# Patient Record
Sex: Female | Born: 1995 | Race: Asian | Hispanic: No | Marital: Single | Smoking: Never smoker
Health system: Southern US, Community
[De-identification: ages and names within clinical notes are randomized; demographics above are authoritative.]

## PROBLEM LIST (undated history)

## (undated) DIAGNOSIS — N319 Neuromuscular dysfunction of bladder, unspecified: Secondary | ICD-10-CM

## (undated) DIAGNOSIS — Q059 Spina bifida, unspecified: Secondary | ICD-10-CM

## (undated) DIAGNOSIS — K592 Neurogenic bowel, not elsewhere classified: Secondary | ICD-10-CM

## (undated) DIAGNOSIS — Z982 Presence of cerebrospinal fluid drainage device: Secondary | ICD-10-CM

## (undated) HISTORY — PX: VENTRICULOPERITONEAL SHUNT: SHX204

---
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
## 2016-01-01 ENCOUNTER — Encounter: Payer: Self-pay | Admitting: Physical Therapy

## 2016-01-01 ENCOUNTER — Ambulatory Visit: Payer: Medicaid Other | Admitting: Physical Therapy

## 2016-01-01 DIAGNOSIS — M6281 Muscle weakness (generalized): Secondary | ICD-10-CM

## 2016-01-01 NOTE — Therapy (Signed)
[place] MAIN [facility] SERVICES [address] [place], [place], [address] Phone: [phone]   Fax:  [phone]  Physical Therapy Treatment  Patient Details  Name: Michelle Randall MRN: [id number] Date of Birth: [birth year] No Data Recorded  Encounter Date: 01/01/2016      PT End of Session - 01/01/16 0854    Visit Number 27   Number of Visits 25   Date for PT Re-Evaluation 04/03/16   Authorization Type medicaid   PT Start Time 0830   PT Stop Time 0915   PT Time Calculation (min) 45 min   Equipment Utilized During Treatment Gait belt   Activity Tolerance Patient tolerated treatment well   Behavior During Therapy [facility] for tasks assessed/performed      History reviewed. No pertinent past medical history.  History reviewed. No pertinent past surgical history.  There were no vitals filed for this visit.  Visit Diagnosis:  Muscle weakness      Subjective Assessment - 01/01/16 0853    Subjective Patient is doing well today.    Patient is accompained by: Family member   Limitations Walking   Patient Stated Goals Patient wants to improve her core strength.    Currently in Pain? No/denies      Therapeutic exercise:  Nu-step x 5 minutes  Resisted side-steeping YTB 4 lengths x 2; Standing mini squats 2 x 10 with YTB around knees to encourage abduction; Sit to stand without UE support 2 x 10; Step-ups to 6" step x 10 bilateral; Quantum leg press 100 #10; Min cueing needed to appropriately perform balance and strengthening tasks with leg, and head position. Decreased coordination demonstrated requiring consistent verbal cueing to correct form. Patient responds well to verbal and tactile cues to correct form and technique. CGA to SBA for safety with activities. Uses to increase intensity and amplitude of movements throughout                            PT Education - 01/01/16 0854    Education provided Yes    Education Details HEP   Person(s) Educated Patient   Methods Explanation   Comprehension Verbalized understanding             PT Long Term Goals - 11/05/15 1809    PT LONG TERM GOAL #1   Status On-going   PT LONG TERM GOAL #2   Status On-going   PT LONG TERM GOAL #3   Status Partially Met   PT LONG TERM GOAL #4   Status Achieved   PT LONG TERM GOAL #5   Status Partially Met   Additional Long Term Goals   Additional Long Term Goals Yes   PT LONG TERM GOAL #6   Status Partially Met   PT LONG TERM GOAL #7   Status Achieved   PT LONG TERM GOAL #8   Title Patient will be able to ambulate on inclines and grass independenlty with LRAD   Time 6   Period Months   Status New   PT LONG TERM GOAL  #9   TITLE Patient will be abe to transfer from low chair or stool wihtout UE support independently   Time 6   Period Months   Status New   PT LONG TERM GOAL  #10   TITLE Patient will be able to transfer from the floor to standing independently with use of LRAD   Time 6  Period Months   Status New               Plan - 01/01/16 9892    Clinical Impression Statement Pt demonstrated decreased endurance with sit-to-stands exhibiting fatigue towards the end of second set with increased breathing rate   Pt will benefit from skilled therapeutic intervention in order to improve on the following deficits Abnormal gait;Decreased balance;Decreased endurance;Difficulty walking;Decreased strength   Rehab Potential Good   Clinical Impairments Affecting Rehab Potential weakness and decreased standing balance   PT Frequency 1x / week   PT Duration 12 weeks   PT Treatment/Interventions Therapeutic exercise;Therapeutic activities;Gait training;Balance training   PT Next Visit Plan Continue with core and LE strengthening and TM ambulation   PT Home Exercise Plan Continued from previous sessions.    Consulted and Agree with Plan of Care Family member/caregiver        Problem  List There are no active problems to display for this patient. Alanson Puls, PT, DPT  [place], Minette Headland S 01/01/2016, 9:07 AM  [place] MAIN [facility] SERVICES [place], [place], [address] Phone: [phone]   Fax:  [phone]  Name: Michelle Randall MRN: [id number] Date of Birth: [birth year]

## 2016-01-02 NOTE — Therapy (Signed)
[place] MAIN [facility] SERVICES [address] [place], [place], [address] Phone: [phone]   Fax:  [phone]  Occupational Therapy Treatment  Patient Details  Name: Michelle Randall MRN: [id number] Date of Birth: [birth year] No Data Recorded  Encounter Date: 12/30/2015      OT End of Session - 01/02/16 0957    Visit Number 5   Date for OT Re-Evaluation 01/20/16   Authorization Type medicaid   OT Start Time 0830   OT Stop Time 0915   OT Time Calculation (min) 45 min   Activity Tolerance Patient tolerated treatment well   Behavior During Therapy [facility] for tasks assessed/performed      No past medical history on file.  No past surgical history on file.  There were no vitals filed for this visit.  Visit Diagnosis:  Muscle weakness  Lack of coordination      Subjective Assessment - 01/02/16 0955    Subjective  Patient reports she will be going to the [facility] with her school team to compete soon.  She is excited and is hopeful they will go to [employer] to compete.    Patient is accompained by: Family member   Patient Stated Goals wants to be independent with all tasks so she will be able to go away to college.   Currently in Pain? No/denies   Multiple Pain Sites No                      OT Treatments/Exercises (OP) - 01/02/16 1003    ADLs   Writing Handwriting skills for taking notes and formulating ocean bowl questions for review for her upcoming tournament for school.  She was able to write/print 8 questions with good legibility 5 of 8.  Required some rest breaks and hand stretches during writing tasks.    Fine Motor Coordination   Other Fine Motor Exercises Patient seen for manipulation of small 1/2 inch sized objects with focus on translatory skills of the hand as well as using the hand for storage while performing prehension patterns. Knotting and unknotting exercises with both hands with knots tighter this date  to work towards coordination and strength of fingers.    Neurological Re-education Exercises   Other Exercises 1 Patient seen for red theraband exercises in sitting for shoulder flexion, ABD, diagonal patterns, elbow flexion extension for 10 reps for 2 sets each.  Resistive pinch pins placing each level of resistance to elevated surface.                  OT Education - 01/02/16 [phone]    Education provided Yes   Education Details handwriting tasks. HEP   Person(s) Educated Patient   Methods Explanation;Demonstration;Verbal cues   Comprehension Verbal cues required;Returned demonstration;Verbalized understanding             OT Long Term Goals - 11/05/15 1730    OT LONG TERM GOAL #1   Title Will cook the evening meal with som help for set up only   Time 12   Status Achieved   OT LONG TERM GOAL #2   Title Will be able to put on tight socks before braces after a shower   Time 12   Period Weeks   Status Achieved   OT LONG TERM GOAL #3   Title Will be able to go through a door that opens out and swings shut   Time 12   Period Weeks  Status Achieved   OT LONG TERM GOAL #4   Title General goal: Paitent will be able to be independent with activities needed when she goes to college next year    Time 12   Period Weeks   Status On-going   OT LONG TERM GOAL #5   Title Patient will improve grip strength to be able to achieve functional goals listed   Time 12   Period Months   Status Achieved   OT LONG TERM GOAL #6   Title Will now be able to go through a door that swings out using motorized wheel chair.   Time 6   Period Months   Status On-going   OT LONG TERM GOAL #7   Title (Higher level grip) Will be able to squeeze a lemon during cooking.   Baseline Only able to partially squeeze a lemon.   Time 6   Period Months   Status Partially Met   OT LONG TERM GOAL #8   Title Patient will be able to donn night leg brace indepenenetly before she goes to college.   Baseline  Dependent at this time mom or dad has to do this.   Time 6   Period Months   Status Achieved   OT LONG TERM GOAL  #9   Baseline Patient will be able to braid her own hair before college   Time 6   Period Months   Status On-going   OT LONG TERM GOAL  #10   TITLE Wll be able to wash her own hair before starting college   Baseline Mom has to wash her hair   Time 6   Period Months   Status Partially Met   OT LONG TERM GOAL  #11   TITLE Will be able to squeeze tooth paste out of the tube when it is less than half full.   Baseline Needs assist from her mom or dad to do this now.   Time 6   Period Months   OT LONG TERM GOAL  #12   TITLE Patient will be able to donn/doff crutch tips.   Baseline unable to donn/doff crutch tips.to change them   Time 6   Period Months   Status On-going   OT LONG TERM GOAL  #13   TITLE Patient will be able to complete 2 1/2 minutes straight with left shoulder flexion simulating driving with a stearing wheel ball to be able to drive to college 1 hr 40 min away   Baseline can do 30 second of shoulder flexion with 2 1/2 before fatigue and can only drive 15 min before right shoulder fatigues.   Time 6   Period Months   Status New   OT LONG TERM GOAL  #14   TITLE Will be able to open taped boxes (for moving to college) using a key or knife.   Baseline can only make a hole in the box and unable to open box.   Time 6   Period Months   Status New   OT LONG TERM GOAL  #15   TITLE Is not able to cut layers of paper which she will have to do for craft classes in college.   Baseline unable to cut through more than 2 layers.   Time 6   Period Months   Status New               Plan - 01/02/16 0957    Clinical Impression Statement Patient able to  demo handwriting with good legibility for printing of science bowl questions however, as she continues to write more than 4-5 sentences, her right hand fatigues and handwriting legibility decreases.  It will be  important for her to improve this skill to be able to take notes in class and especially when she is in college for long lectures.  She may have to compensate at that time with recordings if possible.  She would like to be able to take as many notes as possible since this helps her to learn material in a more comprehensive way.  She is improving with knotting and braiding activities and will need to continue to work on these motions overhead to be able to braid her own hair in a straight line down the back.     Pt will benefit from skilled therapeutic intervention in order to improve on the following deficits (Retired) Impaired UE functional use;Difficulty walking;Decreased strength;Decreased coordination;Decreased endurance   Rehab Potential Good   OT Frequency 1x / week   OT Duration 12 weeks   OT Treatment/Interventions Self-care/ADL training;Therapeutic exercise;Therapeutic exercises;Therapeutic activities   Consulted and Agree with Plan of Care Patient;Family member/caregiver        Problem List There are no active problems to display for this patient. Achilles Dunk, OTR/L, CLT   Lovett,Amy 01/02/2016, 10:17 AM  [place] MAIN [facility] SERVICES [place], [place], [address] Phone: [phone]   Fax:  [phone]  Name: ROLENA KNUTSON MRN: [id number] Date of Birth: 1996/10/19

## 2016-01-07 ENCOUNTER — Ambulatory Visit: Payer: Medicaid Other | Admitting: Physical Therapy

## 2016-01-07 ENCOUNTER — Encounter: Payer: Self-pay | Admitting: Occupational Therapy

## 2016-01-07 ENCOUNTER — Encounter: Payer: Medicaid Other | Admitting: Occupational Therapy

## 2016-01-07 ENCOUNTER — Ambulatory Visit: Payer: Medicaid Other | Admitting: Occupational Therapy

## 2016-01-07 DIAGNOSIS — M6281 Muscle weakness (generalized): Secondary | ICD-10-CM | POA: Diagnosis not present

## 2016-01-07 DIAGNOSIS — R279 Unspecified lack of coordination: Secondary | ICD-10-CM

## 2016-01-08 ENCOUNTER — Encounter: Payer: Self-pay | Admitting: Physical Therapy

## 2016-01-08 ENCOUNTER — Ambulatory Visit: Payer: Medicaid Other | Admitting: Physical Therapy

## 2016-01-08 DIAGNOSIS — R262 Difficulty in walking, not elsewhere classified: Secondary | ICD-10-CM

## 2016-01-08 DIAGNOSIS — M6281 Muscle weakness (generalized): Secondary | ICD-10-CM

## 2016-01-08 DIAGNOSIS — R279 Unspecified lack of coordination: Secondary | ICD-10-CM

## 2016-01-08 DIAGNOSIS — Z9181 History of falling: Secondary | ICD-10-CM

## 2016-01-08 NOTE — Therapy (Signed)
[place] MAIN [facility] SERVICES [address] [place], [place], [address] Phone: [phone]   Fax:  [phone]  Physical Therapy Treatment  Patient Details  Name: Michelle Randall MRN: [id number] Date of Birth: July 18, 1996 No Data Recorded  Encounter Date: 01/08/2016      PT End of Session - 01/08/16 0850    Visit Number 28   Number of Visits 25   Date for PT Re-Evaluation 04/03/16   Authorization Type medicaid   PT Start Time 0840   PT Stop Time 0925   PT Time Calculation (min) 45 min   Equipment Utilized During Treatment Gait belt   Activity Tolerance Patient tolerated treatment well   Behavior During Therapy [facility] for tasks assessed/performed      History reviewed. No pertinent past medical history.  History reviewed. No pertinent past surgical history.  There were no vitals filed for this visit.  Visit Diagnosis:  Muscle weakness  Lack of coordination  Difficulty walking  Personal history of fall      Subjective Assessment - 01/08/16 0849    Subjective Patient is doing well today.    Patient is accompained by: Family member   Limitations Walking   Patient Stated Goals Patient wants to improve her core strength.       Standing exercises with yTB BLE : Marching 2 x 10; SLR 2 x 10; Abduction 2 x 10; Extension 2 x 10; Squats x 10 with 5 sec hold Sorting balls on foam and sorting shapes on foam Resisted side-steeping YTB 4 lengths x 2; Standing mini squats 2 x 10  Sit to stand without UE support 2 x 10; Step-ups to 6" step x 10 bilateral; Quantum leg press 100 lbs x 20 Patient needs occasional verbal cueing to improve posture and cueing to correctly perform exercises slowly, holding at end of range to increase motor firing of desired muscle to encourage fatigue.                            PT Education - 01/08/16 0849    Education provided Yes   Education Details HEP   Person(s) Educated  Patient   Methods Explanation   Comprehension Verbalized understanding             PT Long Term Goals - 11/05/15 1809    PT LONG TERM GOAL #1   Status On-going   PT LONG TERM GOAL #2   Status On-going   PT LONG TERM GOAL #3   Status Partially Met   PT LONG TERM GOAL #4   Status Achieved   PT LONG TERM GOAL #5   Status Partially Met   Additional Long Term Goals   Additional Long Term Goals Yes   PT LONG TERM GOAL #6   Status Partially Met   PT LONG TERM GOAL #7   Status Achieved   PT LONG TERM GOAL #8   Title Patient will be able to ambulate on inclines and grass independenlty with LRAD   Time 6   Period Months   Status New   PT LONG TERM GOAL  #9   TITLE Patient will be abe to transfer from low chair or stool wihtout UE support independently   Time 6   Period Months   Status New   PT LONG TERM GOAL  #10   TITLE Patient will be able to transfer from the floor to standing independently with use  of LRAD   Time 6   Period Months   Status New               Plan - 01/08/16 0850    Clinical Impression Statement VC needed to full ext knees on leg press for increase muscle control.   Pt will benefit from skilled therapeutic intervention in order to improve on the following deficits Abnormal gait;Decreased balance;Decreased endurance;Difficulty walking;Decreased strength   Rehab Potential Good   Clinical Impairments Affecting Rehab Potential weakness and decreased standing balance   PT Frequency 1x / week   PT Duration 12 weeks   PT Treatment/Interventions Therapeutic exercise;Therapeutic activities;Gait training;Balance training   PT Next Visit Plan Continue with core and LE strengthening and TM ambulation   PT Home Exercise Plan Continued from previous sessions.    Consulted and Agree with Plan of Care Family member/caregiver        Problem List There are no active problems to display for this patient.  Alanson Puls, PT, DPT [place], Minette Headland  S 01/08/2016, 9:06 AM  [place] MAIN [facility] SERVICES [address] [place], [place], [address] Phone: [phone]   Fax:  [phone]  Name: Michelle Randall MRN: [id number] Date of Birth: 1996-02-09

## 2016-01-08 NOTE — Therapy (Signed)
[place] MAIN [facility] SERVICES [address] [place], [place], [address] Phone: [phone]   Fax:  [phone]  Occupational Therapy Treatment  Patient Details  Name: Michelle Randall MRN: [id number] Date of Birth: Nov 22, 1995 No Data Recorded  Encounter Date: 01/07/2016      OT End of Session - 01/08/16 1257    Visit Number 6   Number of Visits 24   Date for OT Re-Evaluation 01/20/16   Authorization Type medicaid   OT Start Time 0845   OT Stop Time 0931   OT Time Calculation (min) 46 min   Activity Tolerance Patient tolerated treatment well   Behavior During Therapy [facility] for tasks assessed/performed      History reviewed. No pertinent past medical history.  History reviewed. No pertinent past surgical history.  There were no vitals filed for this visit.  Visit Diagnosis:  Muscle weakness  Lack of coordination      Subjective Assessment - 01/07/16 0848    Subjective  Patient reports they had their [place] this past weekend and they came in 3rd.     Patient Stated Goals wants to be independent with all tasks so she will be able to go away to college.   Currently in Pain? No/denies   Multiple Pain Sites No                      OT Treatments/Exercises (OP) - 01/07/16 1259    ADLs   ADL Comments Issued information regarding loop scissors as well as lemon press, will get a lemon press for patient to try in the clinic before she purchases one to see if she has enough grip to operate.  Review of goals.    Fine Motor Coordination   Other Fine Motor Exercises Manipulation of small washers and dowels with right hand placing onto dowels in various planes with cues.    Neurological Re-education Exercises   Other Exercises 1 Patient seen for strengthening with red theraband for shoulder flexion, ABD, diagonal patterns, elbow flexion/extension of 10 reps for 2 sets each.  Resistive pinch pins placed onto elevated surface  with cues for type of pinch and placement. Patient performing grip strength with 17# for right hand for 25 reps and repeated on left for 25 reps.    Other Exercises 2 Place and hold bilateral UEs in shoulder flexion for hold of 30 sec intervals for 5 sets.                 OT Education - 01/07/16 0849    Education provided Yes   Education Details HEP, handwriting skills, coordination   Person(s) Educated Patient   Methods Explanation;Demonstration;Verbal cues   Comprehension Verbal cues required;Returned demonstration;Verbalized understanding             OT Long Term Goals - 11/05/15 1730    OT LONG TERM GOAL #1   Title Will cook the evening meal with som help for set up only   Time 12   Status Achieved   OT LONG TERM GOAL #2   Title Will be able to put on tight socks before braces after a shower   Time 12   Period Weeks   Status Achieved   OT LONG TERM GOAL #3   Title Will be able to go through a door that opens out and swings shut   Time 12   Period Weeks   Status Achieved   OT LONG TERM  GOAL #4   Title General goal: Paitent will be able to be independent with activities needed when she goes to college next year    Time 12   Period Weeks   Status On-going   OT LONG TERM GOAL #5   Title Patient will improve grip strength to be able to achieve functional goals listed   Time 12   Period Months   Status Achieved   OT LONG TERM GOAL #6   Title Will now be able to go through a door that swings out using motorized wheel chair.   Time 6   Period Months   Status On-going   OT LONG TERM GOAL #7   Title (Higher level grip) Will be able to squeeze a lemon during cooking.   Baseline Only able to partially squeeze a lemon.   Time 6   Period Months   Status Partially Met   OT LONG TERM GOAL #8   Title Patient will be able to donn night leg brace indepenenetly before she goes to college.   Baseline Dependent at this time mom or dad has to do this.   Time 6   Period  Months   Status Achieved   OT LONG TERM GOAL  #9   Baseline Patient will be able to braid her own hair before college   Time 6   Period Months   Status On-going   OT LONG TERM GOAL  #10   TITLE Wll be able to wash her own hair before starting college   Baseline Mom has to wash her hair   Time 6   Period Months   Status Partially Met   OT LONG TERM GOAL  #11   TITLE Will be able to squeeze tooth paste out of the tube when it is less than half full.   Baseline Needs assist from her mom or dad to do this now.   Time 6   Period Months   OT LONG TERM GOAL  #12   TITLE Patient will be able to donn/doff crutch tips.   Baseline unable to donn/doff crutch tips.to change them   Time 6   Period Months   Status On-going   OT LONG TERM GOAL  #13   TITLE Patient will be able to complete 2 1/2 minutes straight with left shoulder flexion simulating driving with a stearing wheel ball to be able to drive to college 1 hr 40 min away   Baseline can do 30 second of shoulder flexion with 2 1/2 before fatigue and can only drive 15 min before right shoulder fatigues.   Time 6   Period Months   Status New   OT LONG TERM GOAL  #14   TITLE Will be able to open taped boxes (for moving to college) using a key or knife.   Baseline can only make a hole in the box and unable to open box.   Time 6   Period Months   Status New   OT LONG TERM GOAL  #15   TITLE Is not able to cut layers of paper which she will have to do for craft classes in college.   Baseline unable to cut through more than 2 layers.   Time 6   Period Months   Status New               Plan - 01/08/16 1258    Clinical Impression Statement Patient progressing with reaching overhead for increased periods of time,  up to 30 seconds to assist with styling hair and eventually braiding hair down the middle.  She may benefit from use of lemon press at home to squeeze lemons for best efficiency.  Some mild difficulty with manipulation of  small washers on dowels at times.    Pt will benefit from skilled therapeutic intervention in order to improve on the following deficits (Retired) Impaired UE functional use;Difficulty walking;Decreased strength;Decreased coordination;Decreased endurance   Rehab Potential Good   OT Frequency 1x / week   OT Duration 12 weeks   OT Treatment/Interventions Self-care/ADL training;Therapeutic exercise;Therapeutic exercises;Therapeutic activities   Consulted and Agree with Plan of Care Patient;Family member/caregiver   Family Member Consulted dad        Problem List There are no active problems to display for this patient.  Achilles Dunk, OTR/L, CLT  Michelle Randall 01/08/2016, 1:22 PM  [place] MAIN [facility] SERVICES [address] [place], [place], [address] Phone: [phone]   Fax:  [phone]  Name: Michelle Randall MRN: [id number] Date of Birth: 1996-02-12

## 2016-01-14 ENCOUNTER — Ambulatory Visit: Payer: Medicaid Other | Admitting: Physical Therapy

## 2016-01-14 ENCOUNTER — Encounter: Payer: Medicaid Other | Admitting: Occupational Therapy

## 2016-01-14 ENCOUNTER — Ambulatory Visit: Payer: Medicaid Other | Attending: Pediatrics | Admitting: Occupational Therapy

## 2016-01-14 DIAGNOSIS — R531 Weakness: Secondary | ICD-10-CM | POA: Diagnosis present

## 2016-01-14 DIAGNOSIS — M6281 Muscle weakness (generalized): Secondary | ICD-10-CM | POA: Insufficient documentation

## 2016-01-14 DIAGNOSIS — R278 Other lack of coordination: Secondary | ICD-10-CM | POA: Diagnosis present

## 2016-01-14 DIAGNOSIS — R279 Unspecified lack of coordination: Secondary | ICD-10-CM | POA: Diagnosis present

## 2016-01-14 DIAGNOSIS — R460 Very low level of personal hygiene: Secondary | ICD-10-CM | POA: Insufficient documentation

## 2016-01-14 DIAGNOSIS — R5381 Other malaise: Secondary | ICD-10-CM | POA: Insufficient documentation

## 2016-01-14 DIAGNOSIS — Z9181 History of falling: Secondary | ICD-10-CM | POA: Insufficient documentation

## 2016-01-14 DIAGNOSIS — R262 Difficulty in walking, not elsewhere classified: Secondary | ICD-10-CM | POA: Insufficient documentation

## 2016-01-15 ENCOUNTER — Ambulatory Visit: Payer: Medicaid Other | Admitting: Physical Therapy

## 2016-01-15 DIAGNOSIS — M6281 Muscle weakness (generalized): Secondary | ICD-10-CM | POA: Diagnosis not present

## 2016-01-15 DIAGNOSIS — R279 Unspecified lack of coordination: Secondary | ICD-10-CM

## 2016-01-15 DIAGNOSIS — R262 Difficulty in walking, not elsewhere classified: Secondary | ICD-10-CM

## 2016-01-15 DIAGNOSIS — Z9181 History of falling: Secondary | ICD-10-CM

## 2016-01-15 NOTE — Therapy (Signed)
[place] MAIN [facility] SERVICES [address] [place], [place], [address] Phone: [phone]   Fax:  [phone]  Physical Therapy Treatment  Patient Details  Name: Michelle Randall MRN: [id number] Date of Birth: May 29, 1996 No Data Recorded  Encounter Date: 01/15/2016      PT End of Session - 01/15/16 0833    Visit Number 29   Number of Visits 25   Date for PT Re-Evaluation 04/03/16   Authorization Type medicaid   PT Start Time 0830   PT Stop Time 0925   PT Time Calculation (min) 55 min   Equipment Utilized During Treatment Gait belt   Activity Tolerance Patient tolerated treatment well   Behavior During Therapy [facility] for tasks assessed/performed      No past medical history on file.  No past surgical history on file.  There were no vitals filed for this visit.  Visit Diagnosis:  Muscle weakness  Lack of coordination  Difficulty walking  Personal history of fall      Subjective Assessment - 01/15/16 0832    Subjective Patient is doing well today.    Patient is accompained by: Family member   Limitations Walking   Patient Stated Goals Patient wants to improve her core strength.       THER-EX TM x 10  minutes  Quantum double leg press 100 x 15 x 3 Mini squats with RTB around knees 2 x 10; RTB side stepping in // bars 4 lengths x 2; Sit to stand without UE support  Neuromuscular training: Airex cone taps alternating LE x 60 seconds; Tandem gait in // bars x 4 laps    Min verbal cues used throughout with increased in postural sway and LOB most seen with narrow base of support and while on uneven surfaces. Continues to have balance deficits typical with diagnosis. Patient performs intermediate level exercises without pain behaviors and needs verbal cuing for postural alignment and head positioning.                            PT Education - 01/15/16 [phone]    Education provided Yes   Education Details  HEP   Person(s) Educated Patient   Methods Explanation   Comprehension Verbalized understanding             PT Long Term Goals - 11/05/15 1809    PT LONG TERM GOAL #1   Status On-going   PT LONG TERM GOAL #2   Status On-going   PT LONG TERM GOAL #3   Status Partially Met   PT LONG TERM GOAL #4   Status Achieved   PT LONG TERM GOAL #5   Status Partially Met   Additional Long Term Goals   Additional Long Term Goals Yes   PT LONG TERM GOAL #6   Status Partially Met   PT LONG TERM GOAL #7   Status Achieved   PT LONG TERM GOAL #8   Title Patient will be able to ambulate on inclines and grass independenlty with LRAD   Time 6   Period Months   Status New   PT LONG TERM GOAL  #9   TITLE Patient will be abe to transfer from low chair or stool wihtout UE support independently   Time 6   Period Months   Status New   PT LONG TERM GOAL  #10   TITLE Patient will be able to transfer from the  floor to standing independently with use of LRAD   Time 6   Period Months   Status New               Plan - 01/15/16 4232    Clinical Impression Statement PT provided min  verbal instruction to improve set up, proper use of LE, and improved posture and gait mechanics. Patient responded moderately to instruction   Pt will benefit from skilled therapeutic intervention in order to improve on the following deficits Abnormal gait;Decreased balance;Decreased endurance;Difficulty walking;Decreased strength   Rehab Potential Good   Clinical Impairments Affecting Rehab Potential weakness and decreased standing balance   PT Frequency 1x / week   PT Duration 12 weeks   PT Treatment/Interventions Therapeutic exercise;Therapeutic activities;Gait training;Balance training   PT Next Visit Plan Continue with core and LE strengthening and TM ambulation   PT Home Exercise Plan Continued from previous sessions.    Consulted and Agree with Plan of Care Family member/caregiver        Problem  List There are no active problems to display for this patient. Alanson Puls, PT, DPT  [place], Minette Headland S 01/15/2016, 8:36 AM  [place] MAIN [facility] SERVICES [address] [place], [place], [address] Phone: [phone]   Fax:  [phone]  Name: Michelle Randall MRN: [id number] Date of Birth: May 16, 1996

## 2016-01-15 NOTE — Therapy (Signed)
[place] MAIN [facility] SERVICES [address] [place], [place], [address] Phone: [phone]   Fax:  [phone]  Occupational Therapy Treatment  Patient Details  Name: Michelle Randall MRN: [id number] Date of Birth: [birth year] No Data Recorded  Encounter Date: 01/14/2016      OT End of Session - 01/15/16 1036    Visit Number 7   Number of Visits 24   Date for OT Re-Evaluation 01/20/16   Authorization Type medicaid   OT Start Time [phone]   OT Stop Time 0932   OT Time Calculation (min) 46 min   Activity Tolerance Patient tolerated treatment well   Behavior During Therapy [facility] for tasks assessed/performed      No past medical history on file.  No past surgical history on file.  There were no vitals filed for this visit.  Visit Diagnosis:  Muscle weakness  Lack of coordination      Subjective Assessment - 01/15/16 1025    Subjective  Patient reports she is excited about graduation soon, has only about 3 months to go.  she is thinking about going to [employer] or [employer].   Patient is accompained by: Family member   Patient Stated Goals wants to be independent with all tasks so she will be able to go away to college.   Currently in Pain? No/denies   Multiple Pain Sites No                      OT Treatments/Exercises (OP) - 01/14/16 1029    Fine Motor Coordination   Other Fine Motor Exercises Manipulation of miniature clothespins to place onto edges of a card with use of right hand.  Manipulation of marbles to pick up from table and using translatory movements to move towards palm and back to finger tips.     Neurological Re-education Exercises   Other Exercises 1 Patient performing UBE for 5 minutes, forwards/back, alternating levels of resistance of 1.4 to 1.8 with therapist in attendance to adjust settings and monitor and ensure grip on right side.  Requires 2 foam balance pads under feet to support and offer stability  to complete task.  Finger ladder in standing for RUE up and down 5 times with alternating fingers, reaching over head with cues.  resistive finger exercises with WEBcando, green resistance.  Grip strength, 3rd setting for 10 reps, 4th setting for 10 reps.  2# dumbells in each hand and performing shoulder flexion, chest press, ER, elbow flexion and supination of forearm for 10 reps each, alternating sides.                 OT Education - 01/15/16 1036    Education provided Yes   Education Details exercises for home program for strength and coordination in relation to ADLs and IADLs   Person(s) Educated Patient   Methods Explanation;Demonstration;Verbal cues   Comprehension Verbal cues required;Returned demonstration;Verbalized understanding             OT Long Term Goals - 11/05/15 1730    OT LONG TERM GOAL #1   Title Will cook the evening meal with som help for set up only   Time 12   Status Achieved   OT LONG TERM GOAL #2   Title Will be able to put on tight socks before braces after a shower   Time 12   Period Weeks   Status Achieved   OT LONG TERM GOAL #3  Title Will be able to go through a door that opens out and swings shut   Time 12   Period Weeks   Status Achieved   OT LONG TERM GOAL #4   Title General goal: Paitent will be able to be independent with activities needed when she goes to college next year    Time 12   Period Weeks   Status On-going   OT LONG TERM GOAL #5   Title Patient will improve grip strength to be able to achieve functional goals listed   Time 12   Period Months   Status Achieved   OT LONG TERM GOAL #6   Title Will now be able to go through a door that swings out using motorized wheel chair.   Time 6   Period Months   Status On-going   OT LONG TERM GOAL #7   Title (Higher level grip) Will be able to squeeze a lemon during cooking.   Baseline Only able to partially squeeze a lemon.   Time 6   Period Months   Status Partially Met    OT LONG TERM GOAL #8   Title Patient will be able to donn night leg brace indepenenetly before she goes to college.   Baseline Dependent at this time mom or dad has to do this.   Time 6   Period Months   Status Achieved   OT LONG TERM GOAL  #9   Baseline Patient will be able to braid her own hair before college   Time 6   Period Months   Status On-going   OT LONG TERM GOAL  #10   TITLE Wll be able to wash her own hair before starting college   Baseline Mom has to wash her hair   Time 6   Period Months   Status Partially Met   OT LONG TERM GOAL  #11   TITLE Will be able to squeeze tooth paste out of the tube when it is less than half full.   Baseline Needs assist from her mom or dad to do this now.   Time 6   Period Months   OT LONG TERM GOAL  #12   TITLE Patient will be able to donn/doff crutch tips.   Baseline unable to donn/doff crutch tips.to change them   Time 6   Period Months   Status On-going   OT LONG TERM GOAL  #13   TITLE Patient will be able to complete 2 1/2 minutes straight with left shoulder flexion simulating driving with a stearing wheel ball to be able to drive to college 1 hr 40 min away   Baseline can do 30 second of shoulder flexion with 2 1/2 before fatigue and can only drive 15 min before right shoulder fatigues.   Time 6   Period Months   Status New   OT LONG TERM GOAL  #14   TITLE Will be able to open taped boxes (for moving to college) using a key or knife.   Baseline can only make a hole in the box and unable to open box.   Time 6   Period Months   Status New   OT LONG TERM GOAL  #15   TITLE Is not able to cut layers of paper which she will have to do for craft classes in college.   Baseline unable to cut through more than 2 layers.   Time 6   Period Months   Status New  Plan - 01/15/16 1037    Clinical Impression Statement Patient able to perform 2# weight this date for UE exercises and performing select exercises in  standing versus sitting.  She continues to work towards strength in her hands to perform daily tasks as well as improve coordination for manipulation of objects which helps her at home, school and in the community be more independent.     Pt will benefit from skilled therapeutic intervention in order to improve on the following deficits (Retired) Impaired UE functional use;Difficulty walking;Decreased strength;Decreased coordination;Decreased endurance   Rehab Potential Good   OT Frequency 1x / week   OT Duration 12 weeks   OT Treatment/Interventions Self-care/ADL training;Therapeutic exercise;Therapeutic exercises;Therapeutic activities   Consulted and Agree with Plan of Care Patient;Family member/caregiver   Family Member Consulted dad        Problem List There are no active problems to display for this patient.  Achilles Dunk, OTR/L, CLT  Tre Sanker 01/15/2016, 10:39 AM  [place] MAIN [facility] SERVICES [place], [place], [address] Phone: [phone]   Fax:  [phone]  Name: SHAMEKIA TIPPETS MRN: [id number] Date of Birth: 07-14-1996

## 2016-01-20 ENCOUNTER — Encounter: Payer: Self-pay | Admitting: Occupational Therapy

## 2016-01-20 ENCOUNTER — Ambulatory Visit: Payer: Medicaid Other | Admitting: Occupational Therapy

## 2016-01-20 DIAGNOSIS — R460 Very low level of personal hygiene: Secondary | ICD-10-CM

## 2016-01-20 DIAGNOSIS — Z789 Other specified health status: Secondary | ICD-10-CM

## 2016-01-20 DIAGNOSIS — M6281 Muscle weakness (generalized): Secondary | ICD-10-CM | POA: Diagnosis not present

## 2016-01-20 DIAGNOSIS — R6889 Other general symptoms and signs: Secondary | ICD-10-CM

## 2016-01-20 DIAGNOSIS — Z741 Need for assistance with personal care: Secondary | ICD-10-CM

## 2016-01-20 DIAGNOSIS — R279 Unspecified lack of coordination: Secondary | ICD-10-CM

## 2016-01-21 ENCOUNTER — Encounter: Payer: Medicaid Other | Admitting: Occupational Therapy

## 2016-01-21 ENCOUNTER — Ambulatory Visit: Payer: Medicaid Other | Admitting: Physical Therapy

## 2016-01-21 NOTE — Therapy (Signed)
[place] MAIN [facility] SERVICES [address] [place], [place], [address] Phone: [phone]   Fax:  [phone]  Occupational Therapy Treatment  Patient Details  Name: Michelle Randall MRN: [id number] Date of Birth: 1995/12/08 No Data Recorded  Encounter Date: 01/20/2016      OT End of Session - 01/20/16 1200    Visit Number 8   Number of Visits 24   Date for OT Re-Evaluation 04/13/16   OT Start Time 0832   OT Stop Time 0916   OT Time Calculation (min) 44 min   Activity Tolerance Patient tolerated treatment well   Behavior During Therapy [facility] for tasks assessed/performed      History reviewed. No pertinent past medical history.  History reviewed. No pertinent past surgical history.  There were no vitals filed for this visit.  Visit Diagnosis:  Muscle weakness - Plan: Ot plan of care cert/re-cert  Lack of coordination - Plan: Ot plan of care cert/re-cert  Impaired instrumental activities of daily living (IADL) - Plan: Ot plan of care cert/re-cert  Self-care deficit for dressing and grooming - Plan: Ot plan of care cert/re-cert      Subjective Assessment - 01/20/16 0839    Subjective  Patient reports she will be graduating soon and unsure of what college she will attend.  It is a possibility she will go to [employer] which means she will need to be independent with all tasks in order to live alone on campus.  She will need a handicapped accessible room.  She feels she continues to make good progress in all areas, feels more independent with tasks at home and in high school.   Patient is accompained by: Family member   Patient Stated Goals wants to be independent with all tasks so she will be able to go away to college.   Currently in Pain? No/denies   Multiple Pain Sites No                      OT Treatments/Exercises (OP) - 01/20/16 1200    ADLs   ADL Comments Reassessment of ADLs:  Patient continues to  demonstrate difficulty with washing hair, lacks strength and endurance to keep hands overhead to wash her long hair.  She is modified independent with basic dressing tasks however she lacks speed to complete task to get to school on time.  she requires assist to get in and out of the shower and would benefit from a shower chair.  Patient can complete most grooming tasks from a seated position, she cannot perform tasks in standing with decreased balance to perform tasks with both hands involved such as hair care.    She also requires assistance to pick up her bookbag to put it on the back of her wheelchair.  IADLs:  Patient still has difficulty with making the bed, specifically pulling up the sheets and navigating to the other side to complete task.  She would like to be able to vacuum however doesn't feel she can balance enough to perform task.  Patient continues to have difficulty with opening and closing the doors while using crutches and/or her chair.  She will need to be able to put her AFOs on in a timely manner.  Patient demos difficulty with placing clothes on hangers.     Fine Motor Coordination   Other Fine Motor Exercises Reassessment of coordination:   9 hole peg test:  right 23 secs, left 25 secs.  Neurological Re-education Exercises   Other Exercises 1 Reassessment of strength and ROM.  Patient able to demo full ROM of bilateral UEs however she lacks the endurance to maintain the position of hands over her head to complete self care tasks.  Strength in bilateral UEs is 4/5 overall. Right lateral pinch 14#, 3 point pinch 14#, 2 point pinch 8#.  Left pinch 12#, 3 point pinch 15#, 2 point pinch 9#.  Grip right hand 45#, left 41#.                  OT Education - 01/20/16 1200    Education provided Yes   Education Details HEP   Person(s) Educated Patient   Methods Explanation;Demonstration;Verbal cues   Comprehension Verbal cues required;Returned demonstration;Verbalized understanding              OT Long Term Goals - 01/21/16 0932    OT LONG TERM GOAL #1   Title Patient will demonstrate methods of picking up her bookbag and placing it onto the back of her chair with modified independence to transport items to class.   Baseline requires assistance from others to complete now but will need to be independent when living alone at college.   Time 6   Period Months   Status New   OT LONG TERM GOAL #2   Title Patient will be able to make her bed with modified independence.   Time 6   Period Months   Status New   OT LONG TERM GOAL #3   Title Patient will demonstrate the ability to perform vacuuming of one room with modified independence using light weight vacuum.   Baseline unable to perform currently.   Time 6   Period Months   Status New   OT LONG TERM GOAL #4   Title Patient will demonstrate the ability to hang clothes onto hangers to place in the closet with modified independence.   Baseline requires help from mom at this time, moderate assist   Time 6   Period Months   Status On-going   OT LONG TERM GOAL #5   Title Patient will improve grip strength to be able to achieve functional goals listed   Time 12   Period Months   Status Achieved   OT LONG TERM GOAL #6   Title Will now be able to go through a door that swings out using motorized wheel chair.   Baseline difficulty depending on type of door and set up of the area   Time 6   Period Months   Status On-going   OT LONG TERM GOAL #7   Title (Higher level grip) Will be able to squeeze a lemon during cooking.   Baseline cannot fully squeeze lemon to get juice out, exploring the use of a lemon press to assist   Time 6   Period Months   Status Partially Met   OT LONG TERM GOAL #8   Title Patient will be able to donn night leg brace independently before she goes to college.   Time 6   Period Months   Status Achieved   OT LONG TERM GOAL  #9   Baseline Patient will be able to braid her own hair  before college   Time 6   Period Months   Status On-going   OT LONG TERM GOAL  #10   TITLE Wll be able to wash her own hair before starting college   Baseline mom still has to help with  more than 50% of task.  Unable to hold arms up long enough to complete and get shampoo out of long hair.   Time 6   Period Months   Status On-going   OT LONG TERM GOAL  #11   TITLE Will be able to squeeze tooth paste out of the tube when it is less than half full.   Baseline still requires assist if tube is less than 1/2 full.   Time 6   Period Months   Status Partially Met   OT LONG TERM GOAL  #12   TITLE Patient will be able to donn/doff crutch tips.   Baseline crutch tips are difficult for anyone to take them off, goal deferred.   Time 6   Period Months   Status Not Met   OT LONG TERM GOAL  #13   TITLE Patient will be able to complete 2 1/2 minutes straight with left shoulder flexion simulating driving with a stearing wheel ball to be able to drive to college 1 hr 40 min away   Baseline can do 30 second of shoulder flexion with 2 1/2 before fatigue and can only drive 15 min before right shoulder fatigues.   Time 6   Period Months   Status On-going   OT LONG TERM GOAL  #14   TITLE Will be able to open taped boxes (for moving to college) using a key or knife.   Baseline ongoing techniques explored with adaptive devices to assist.   Time 6   Period Months   Status On-going   OT LONG TERM GOAL  #15   TITLE  cut layers of paper which she will have to do for craft classes in college.   Time 6   Period Months   Status Achieved               Plan - 01/20/16 1200    Clinical Impression Statement Patient has made excellent progress with therapy over the last months, goals updated this date to reflect progress.  Patient will be graduating high school soon and has applied to multiple colleges with the intention to live on campus and therefore will need to be independent in all tasks (ADLs,  IADLs including school tasks).  She will need additional focus on self care tasks to be independent in washing and styling hair, getting in and out of the shower, improved speed of dressing to get to class on time and improved balance to complete tasks in standing at the sink with use of both hands. She will need to be able to manage her room such as light cleaning, making her bed, vacuuming and hanging her clothes.  She continues to demo difficulty with managing doors while using crutches and/or the wheelchair.  Patient will benefit from continued skilled OT to maximize her safety and independence in necessary daily tasks and transition to independent living at college.   Pt will benefit from skilled therapeutic intervention in order to improve on the following deficits (Retired) Impaired UE functional use;Difficulty walking;Decreased strength;Decreased coordination;Decreased endurance   Rehab Potential Good   OT Frequency 1x / week   OT Duration 12 weeks   OT Treatment/Interventions Self-care/ADL training;Therapeutic exercise;Therapeutic exercises;Therapeutic activities   Consulted and Agree with Plan of Care Patient;Family member/caregiver   Family Member Consulted dad        Problem List There are no active problems to display for this patient.  Nakita Santerre T Tomasita Morrow, OTR/L, CLT Delaney Schnick 01/21/2016, 9:53 AM  [place]  [facility] MAIN [facility] SERVICES [place], [place], [address] Phone: [phone]   Fax:  [phone]  Name: Michelle Randall MRN: [id number] Date of Birth: October 28, 1996

## 2016-01-22 ENCOUNTER — Encounter: Payer: Self-pay | Admitting: Physical Therapy

## 2016-01-22 ENCOUNTER — Ambulatory Visit: Payer: Medicaid Other | Admitting: Physical Therapy

## 2016-01-22 DIAGNOSIS — R262 Difficulty in walking, not elsewhere classified: Secondary | ICD-10-CM

## 2016-01-22 DIAGNOSIS — M6281 Muscle weakness (generalized): Secondary | ICD-10-CM | POA: Diagnosis not present

## 2016-01-22 DIAGNOSIS — R279 Unspecified lack of coordination: Secondary | ICD-10-CM

## 2016-01-22 DIAGNOSIS — Z9181 History of falling: Secondary | ICD-10-CM

## 2016-01-22 NOTE — Therapy (Signed)
[place] MAIN [facility] SERVICES [address] [place], [place], [address] Phone: [phone]   Fax:  [phone]  Physical Therapy Treatment  Patient Details  Name: Michelle Randall MRN: [id number] Date of Birth: Apr 27, 1996 No Data Recorded  Encounter Date: 01/22/2016      PT End of Session - 01/22/16 0839    Visit Number 30   Date for PT Re-Evaluation 04/03/16   Authorization Type medicaid   PT Start Time 0830   PT Stop Time 0910   PT Time Calculation (min) 40 min   Behavior During Therapy [facility] for tasks assessed/performed      History reviewed. No pertinent past medical history.  History reviewed. No pertinent past surgical history.  There were no vitals filed for this visit.  Visit Diagnosis:  Muscle weakness  Lack of coordination  Difficulty walking  Personal history of fall      Subjective Assessment - 01/22/16 0838    Subjective Patient is doing well today.    Patient is accompained by: Family member   Limitations Walking   Patient Stated Goals Patient wants to improve her core strength.       TM x 1. 0 miles /hour x 10  Side stepping in parallel bars with YTB x 4 lengths SAQ x 20 with 2 lbs Sit to stand x 10 x 15 from chair without using UE Patient needs occasional verbal cueing to improve posture and cueing to correctly perform exercises slowly, holding at end of range to increase motor firing of desired muscle to encourage fatigue.                             PT Education - 01/22/16 [phone]    Education provided Yes   Education Details HEP   Person(s) Educated Patient   Methods Explanation   Comprehension Verbalized understanding             PT Long Term Goals - 11/05/15 1809    PT LONG TERM GOAL #1   Status On-going   PT LONG TERM GOAL #2   Status On-going   PT LONG TERM GOAL #3   Status Partially Met   PT LONG TERM GOAL #4   Status Achieved   PT LONG TERM GOAL #5   Status  Partially Met   Additional Long Term Goals   Additional Long Term Goals Yes   PT LONG TERM GOAL #6   Status Partially Met   PT LONG TERM GOAL #7   Status Achieved   PT LONG TERM GOAL #8   Title Patient will be able to ambulate on inclines and grass independenlty with LRAD   Time 6   Period Months   Status New   PT LONG TERM GOAL  #9   TITLE Patient will be abe to transfer from low chair or stool wihtout UE support independently   Time 6   Period Months   Status New   PT LONG TERM GOAL  #10   TITLE Patient will be able to transfer from the floor to standing independently with use of LRAD   Time 6   Period Months   Status New               Plan - 01/22/16 1950    Clinical Impression Statement Pt demonstrated decreased endurance with sit-to-stands exhibiting fatigue towards the end of second set with increased breathing rate.  Pt will benefit from skilled therapeutic intervention in order to improve on the following deficits Abnormal gait;Decreased balance;Decreased endurance;Difficulty walking;Decreased strength   Rehab Potential Good   Clinical Impairments Affecting Rehab Potential weakness and decreased standing balance   PT Frequency 1x / week   PT Duration 12 weeks   PT Treatment/Interventions Therapeutic exercise;Therapeutic activities;Gait training;Balance training   PT Next Visit Plan Continue with core and LE strengthening and TM ambulation   PT Home Exercise Plan Continued from previous sessions.    Consulted and Agree with Plan of Care Family member/caregiver        Problem List There are no active problems to display for this patient. Alanson Puls, PT, DPT  [place], Minette Headland S 01/22/2016, 9:24 AM  [place] MAIN [facility] SERVICES [address] [place], [place], [address] Phone: [phone]   Fax:  [phone]  Name: Michelle Randall MRN: [id number] Date of Birth: February 13, 1996

## 2016-01-27 ENCOUNTER — Ambulatory Visit: Payer: Medicaid Other | Admitting: Occupational Therapy

## 2016-01-27 DIAGNOSIS — Z789 Other specified health status: Secondary | ICD-10-CM

## 2016-01-27 DIAGNOSIS — M6281 Muscle weakness (generalized): Secondary | ICD-10-CM | POA: Diagnosis not present

## 2016-01-27 DIAGNOSIS — R460 Very low level of personal hygiene: Secondary | ICD-10-CM

## 2016-01-27 DIAGNOSIS — R279 Unspecified lack of coordination: Secondary | ICD-10-CM

## 2016-01-27 DIAGNOSIS — Z741 Need for assistance with personal care: Secondary | ICD-10-CM

## 2016-01-27 DIAGNOSIS — R6889 Other general symptoms and signs: Secondary | ICD-10-CM

## 2016-01-28 ENCOUNTER — Ambulatory Visit: Payer: Medicaid Other | Admitting: Physical Therapy

## 2016-01-28 ENCOUNTER — Encounter: Payer: Medicaid Other | Admitting: Occupational Therapy

## 2016-01-28 NOTE — Therapy (Signed)
[place] MAIN [facility] SERVICES [address] [place], [place], [address] Phone: [phone]   Fax:  [phone]  Occupational Therapy Treatment  Patient Details  Name: Michelle Randall MRN: [id number] Date of Birth: [birth year] No Data Recorded  Encounter Date: 01/27/2016      OT End of Session - 01/28/16 1629    Visit Number 9   Number of Visits 24   Date for OT Re-Evaluation 04/13/16   Authorization Type medicaid   OT Start Time 0831   OT Stop Time 0916   OT Time Calculation (min) 45 min   Activity Tolerance Patient tolerated treatment well   Behavior During Therapy [facility] for tasks assessed/performed      No past medical history on file.  No past surgical history on file.  There were no vitals filed for this visit.  Visit Diagnosis:  Muscle weakness  Lack of coordination  Impaired instrumental activities of daily living (IADL)  Self-care deficit for dressing and grooming      Subjective Assessment - 01/28/16 1623    Subjective  Patient reports she is doing well, still has trouble with braiding her hair, keeping her arms up the whole time.  Still has not made her final decision on which college she will be attending in the fall.    Patient is accompained by: Family member   Patient Stated Goals wants to be independent with all tasks so she will be able to go away to college.   Currently in Pain? No/denies   Multiple Pain Sites No                      OT Treatments/Exercises (OP) - 01/27/16 1625    ADLs   Grooming Patient seen this date for trials of braiding over head to be able to braid her own hair.  Patient able to complete about 1/2 of task before fatiguing and needs to work more on overhead strength and endurance.  Multiple trials completed.   Fine Motor Coordination   Other Fine Motor Exercises Manipulation of coins from tabletop and placing into resistive bank this date with each hand.  Purdue pegboard  with manipulation of small washers, collars and dowels to assemble small pieces. Cues provided for technique.   Neurological Re-education Exercises   Other Exercises 1 Patient seen for red theraband strengthening exercises for shoulder flexion, ABD, diagonal patterns, elbow flexion/ext for 2 sets of 10 reps each.  Grip strength 17# for 25 reps on right hand left hands.  Saebo tower for reaching tasks in elelvated planes 4 levels each side, R and L, cues as needed.                 OT Education - 01/28/16 1629    Education provided Yes   Education Details hair braiding, exercises   Person(s) Educated Patient   Methods Explanation;Demonstration;Verbal cues   Comprehension Verbal cues required;Returned demonstration;Verbalized understanding             OT Long Term Goals - 01/21/16 0932    OT LONG TERM GOAL #1   Title Patient will demonstrate methods of picking up her bookbag and placing it onto the back of her chair with modified independence to transport items to class.   Baseline requires assistance from others to complete now but will need to be independent when living alone at college.   Time 6   Period Months   Status New   OT LONG  TERM GOAL #2   Title Patient will be able to make her bed with modified independence.   Time 6   Period Months   Status New   OT LONG TERM GOAL #3   Title Patient will demonstrate the ability to perform vacuuming of one room with modified independence using light weight vacuum.   Baseline unable to perform currently.   Time 6   Period Months   Status New   OT LONG TERM GOAL #4   Title Patient will demonstrate the ability to hang clothes onto hangers to place in the closet with modified independence.   Baseline requires help from mom at this time, moderate assist   Time 6   Period Months   Status On-going   OT LONG TERM GOAL #5   Title Patient will improve grip strength to be able to achieve functional goals listed   Time 12   Period  Months   Status Achieved   OT LONG TERM GOAL #6   Title Will now be able to go through a door that swings out using motorized wheel chair.   Baseline difficulty depending on type of door and set up of the area   Time 6   Period Months   Status On-going   OT LONG TERM GOAL #7   Title (Higher level grip) Will be able to squeeze a lemon during cooking.   Baseline cannot fully squeeze lemon to get juice out, exploring the use of a lemon press to assist   Time 6   Period Months   Status Partially Met   OT LONG TERM GOAL #8   Title Patient will be able to donn night leg brace independently before she goes to college.   Time 6   Period Months   Status Achieved   OT LONG TERM GOAL  #9   Baseline Patient will be able to braid her own hair before college   Time 6   Period Months   Status On-going   OT LONG TERM GOAL  #10   TITLE Wll be able to wash her own hair before starting college   Baseline mom still has to help with more than 50% of task.  Unable to hold arms up long enough to complete and get shampoo out of long hair.   Time 6   Period Months   Status On-going   OT LONG TERM GOAL  #11   TITLE Will be able to squeeze tooth paste out of the tube when it is less than half full.   Baseline still requires assist if tube is less than 1/2 full.   Time 6   Period Months   Status Partially Met   OT LONG TERM GOAL  #12   TITLE Patient will be able to donn/doff crutch tips.   Baseline crutch tips are difficult for anyone to take them off, goal deferred.   Time 6   Period Months   Status Not Met   OT LONG TERM GOAL  #13   TITLE Patient will be able to complete 2 1/2 minutes straight with left shoulder flexion simulating driving with a stearing wheel ball to be able to drive to college 1 hr 40 min away   Baseline can do 30 second of shoulder flexion with 2 1/2 before fatigue and can only drive 15 min before right shoulder fatigues.   Time 6   Period Months   Status On-going   OT LONG  TERM GOAL  #14  TITLE Will be able to open taped boxes (for moving to college) using a key or knife.   Baseline ongoing techniques explored with adaptive devices to assist.   Time 6   Period Months   Status On-going   OT LONG TERM GOAL  #15   TITLE  cut layers of paper which she will have to do for craft classes in college.   Time 6   Period Months   Status Achieved               Plan - 01/28/16 1630    Clinical Impression Statement Patient continues to demo difficulty with braiding her hair, getting a full braid completed down the middle of the back of her hair.  She will need to be able to complete independently to be able to live alone at college.  Will continue to work on shoulder strength, coordination to complete self care tasks with greater independence.     Pt will benefit from skilled therapeutic intervention in order to improve on the following deficits (Retired) Impaired UE functional use;Difficulty walking;Decreased strength;Decreased coordination;Decreased endurance   Rehab Potential Good   OT Frequency 1x / week   OT Duration 12 weeks   OT Treatment/Interventions Self-care/ADL training;Therapeutic exercise;Therapeutic exercises;Therapeutic activities   Consulted and Agree with Plan of Care Patient;Family member/caregiver   Family Member Consulted mom        Problem List There are no active problems to display for this patient.  Achilles Dunk, OTR/L, CLT  Michelle Randall 01/28/2016, 4:41 PM  [place] MAIN [facility] SERVICES [address] [place], [place], [address] Phone: [phone]   Fax:  [phone]  Name: Michelle Randall MRN: [id number] Date of Birth: 04/02/1996

## 2016-01-29 ENCOUNTER — Ambulatory Visit: Payer: Medicaid Other | Admitting: Physical Therapy

## 2016-01-29 ENCOUNTER — Encounter: Payer: Self-pay | Admitting: Physical Therapy

## 2016-01-29 DIAGNOSIS — M6281 Muscle weakness (generalized): Secondary | ICD-10-CM

## 2016-01-29 DIAGNOSIS — R262 Difficulty in walking, not elsewhere classified: Secondary | ICD-10-CM

## 2016-01-29 DIAGNOSIS — Z9181 History of falling: Secondary | ICD-10-CM

## 2016-01-29 DIAGNOSIS — R279 Unspecified lack of coordination: Secondary | ICD-10-CM

## 2016-01-29 NOTE — Therapy (Signed)
[place] MAIN [facility] SERVICES [address] [place], [place], [address] Phone: [phone]   Fax:  [phone]  Physical Therapy Treatment  Patient Details  Name: Michelle Randall MRN: [id number] Date of Birth: 01/30/1996 No Data Recorded  Encounter Date: 01/29/2016      PT End of Session - 01/29/16 0909    Visit Number 30   Date for PT Re-Evaluation 04/03/16   Authorization Type medicaid   PT Start Time 0830   PT Stop Time 0915   PT Time Calculation (min) 45 min   Behavior During Therapy [facility] for tasks assessed/performed      History reviewed. No pertinent past medical history.  History reviewed. No pertinent past surgical history.  There were no vitals filed for this visit.  Visit Diagnosis:  Muscle weakness  Lack of coordination  Difficulty walking  Personal history of fall      Subjective Assessment - 01/29/16 0908    Subjective Patient is doing well today.    Patient is accompained by: Family member   Limitations Walking   Patient Stated Goals Patient wants to improve her core strength.       Gait training on steps with single loftstrand x 10 x 2 Ascending/descending curb with SBA and 2 loft strand crutches  TM x 10 minutes with interval speeds from 1.0 to 1.3 miles / hour Strengthening hip flex seated x 20 LAQ x 20  Marching on steps x 20 with railing  Patient needs occasional verbal cueing to improve posture and cueing to correctly perform exercises slowly, holding at end of range to increase motor firing of desired muscle to encourage fatigue.                            PT Education - 01/29/16 0908    Education provided Yes   Person(s) Educated Patient   Methods Explanation   Comprehension Verbalized understanding             PT Long Term Goals - 11/05/15 1809    PT LONG TERM GOAL #1   Status On-going   PT LONG TERM GOAL #2   Status On-going   PT LONG TERM GOAL #3   Status  Partially Met   PT LONG TERM GOAL #4   Status Achieved   PT LONG TERM GOAL #5   Status Partially Met   Additional Long Term Goals   Additional Long Term Goals Yes   PT LONG TERM GOAL #6   Status Partially Met   PT LONG TERM GOAL #7   Status Achieved   PT LONG TERM GOAL #8   Title Patient will be able to ambulate on inclines and grass independenlty with LRAD   Time 6   Period Months   Status New   PT LONG TERM GOAL  #9   TITLE Patient will be abe to transfer from low chair or stool wihtout UE support independently   Time 6   Period Months   Status New   PT LONG TERM GOAL  #10   TITLE Patient will be able to transfer from the floor to standing independently with use of LRAD   Time 6   Period Months   Status New               Plan - 01/29/16 0910    Clinical Impression Statement  Instruction in use of adapted devices on stairs and curbs.  Pt will benefit from skilled therapeutic intervention in order to improve on the following deficits Abnormal gait;Decreased balance;Decreased endurance;Difficulty walking;Decreased strength   Rehab Potential Good   Clinical Impairments Affecting Rehab Potential weakness and decreased standing balance   PT Frequency 1x / week   PT Duration 12 weeks   PT Treatment/Interventions Therapeutic exercise;Therapeutic activities;Gait training;Balance training   PT Next Visit Plan Continue with core and LE strengthening and TM ambulation   PT Home Exercise Plan Continued from previous sessions.    Consulted and Agree with Plan of Care Family member/caregiver        Problem List There are no active problems to display for this patient. Alanson Puls, PT, DPT  [place], [place] S 01/29/2016, 9:15 AM  [place] MAIN [facility] SERVICES [place], [place], [address] Phone: [phone]   Fax:  [phone]  Name: Michelle Randall MRN: [id number] Date of Birth: 1996/05/03

## 2016-02-02 ENCOUNTER — Ambulatory Visit: Payer: Medicaid Other | Admitting: Physical Therapy

## 2016-02-02 ENCOUNTER — Encounter: Payer: Self-pay | Admitting: Physical Therapy

## 2016-02-02 DIAGNOSIS — M6281 Muscle weakness (generalized): Secondary | ICD-10-CM

## 2016-02-02 DIAGNOSIS — R279 Unspecified lack of coordination: Secondary | ICD-10-CM

## 2016-02-02 DIAGNOSIS — Z9181 History of falling: Secondary | ICD-10-CM

## 2016-02-02 DIAGNOSIS — R262 Difficulty in walking, not elsewhere classified: Secondary | ICD-10-CM

## 2016-02-02 NOTE — Therapy (Signed)
[place] MAIN [facility] SERVICES [address] [place], [place], [address] Phone: [phone]   Fax:  [phone]  Physical Therapy Treatment  Patient Details  Name: Michelle Randall MRN: [id number] Date of Birth: [birth year] No Data Recorded  Encounter Date: 02/02/2016      PT End of Session - 02/02/16 0837    Visit Number 31   Date for PT Re-Evaluation 04/03/16   Authorization Type medicaid   PT Start Time [phone]   PT Stop Time 0915   PT Time Calculation (min) 42 min   Behavior During Therapy [facility] for tasks assessed/performed      History reviewed. No pertinent past medical history.  History reviewed. No pertinent past surgical history.  There were no vitals filed for this visit.  Visit Diagnosis:  Muscle weakness  Lack of coordination  Difficulty walking  Personal history of fall      Subjective Assessment - 02/02/16 0836    Subjective Patient is doing well today.    Patient is accompained by: Family member   Limitations Walking   Patient Stated Goals Patient wants to improve her core strength.     TM x 10 minutes with interval speeds 1.0 to 1.3 miles / hour Ascending /descending steps with 1 rail left side , 1 rail right side and loft strand crutch, B loftstrand crutch Prone hip extension and hands and knee LE extension, UE extension x 10 x 2 Hands and knees AI shoulders and hips diagonals and side to side and fwd/bwd Supine resisted LE flex/ext x 20, hip abd no resistance x 20 Patient needs occasional verbal cueing to improve posture and cueing to correctly perform exercises slowly, holding at end of range to increase motor firing of desired muscle to encourage fatigue.                             PT Education - 02/02/16 [phone]    Education provided Yes   Person(s) Educated Patient   Methods Explanation   Comprehension Verbalized understanding             PT Long Term Goals - 11/05/15 1809     PT LONG TERM GOAL #1   Status On-going   PT LONG TERM GOAL #2   Status On-going   PT LONG TERM GOAL #3   Status Partially Met   PT LONG TERM GOAL #4   Status Achieved   PT LONG TERM GOAL #5   Status Partially Met   Additional Long Term Goals   Additional Long Term Goals Yes   PT LONG TERM GOAL #6   Status Partially Met   PT LONG TERM GOAL #7   Status Achieved   PT LONG TERM GOAL #8   Title Patient will be able to ambulate on inclines and grass independenlty with LRAD   Time 6   Period Months   Status New   PT LONG TERM GOAL  #9   TITLE Patient will be abe to transfer from low chair or stool wihtout UE support independently   Time 6   Period Months   Status New   PT LONG TERM GOAL  #10   TITLE Patient will be able to transfer from the floor to standing independently with use of LRAD   Time 6   Period Months   Status New               Plan -  02/02/16 6270    Clinical Impression Statement Instruction in use of adapted devices on steps, stairs, and curbs.   Pt will benefit from skilled therapeutic intervention in order to improve on the following deficits Abnormal gait;Decreased balance;Decreased endurance;Difficulty walking;Decreased strength   Rehab Potential Good   Clinical Impairments Affecting Rehab Potential weakness and decreased standing balance   PT Frequency 1x / week   PT Duration 12 weeks   PT Treatment/Interventions Therapeutic exercise;Therapeutic activities;Gait training;Balance training   PT Next Visit Plan Continue with core and LE strengthening and TM ambulation   PT Home Exercise Plan Continued from previous sessions.    Consulted and Agree with Plan of Care Family member/caregiver        Problem List There are no active problems to display for this patient. Alanson Puls, PT, DPT  [place], [place] S 02/02/2016, 9:22 AM  [place] MAIN [facility] SERVICES [address] [place], [place],  [address] Phone: [phone]   Fax:  [phone]  Name: Michelle Randall MRN: [id number] Date of Birth: [birth year]

## 2016-02-03 ENCOUNTER — Encounter: Payer: Medicaid Other | Admitting: Occupational Therapy

## 2016-02-04 ENCOUNTER — Encounter: Payer: Medicaid Other | Admitting: Occupational Therapy

## 2016-02-04 ENCOUNTER — Ambulatory Visit: Payer: Medicaid Other | Admitting: Physical Therapy

## 2016-02-05 ENCOUNTER — Ambulatory Visit: Payer: Medicaid Other | Admitting: Occupational Therapy

## 2016-02-05 DIAGNOSIS — R531 Weakness: Secondary | ICD-10-CM

## 2016-02-05 DIAGNOSIS — M6281 Muscle weakness (generalized): Secondary | ICD-10-CM | POA: Diagnosis not present

## 2016-02-05 DIAGNOSIS — R279 Unspecified lack of coordination: Secondary | ICD-10-CM

## 2016-02-05 NOTE — Therapy (Signed)
[place] MAIN [facility] SERVICES [address] [place], [place], [address] Phone: [phone]   Fax:  [phone]  Occupational Therapy Treatment  Patient Details  Name: Michelle Randall MRN: [id number] Date of Birth: [birth year] No Data Recorded  Encounter Date: 02/05/2016      OT End of Session - 02/05/16 1714    Visit Number 10   Number of Visits 24   Date for OT Re-Evaluation 04/13/16   Authorization Type medicaid   OT Start Time 1632   OT Stop Time 1710   OT Time Calculation (min) 38 min   Activity Tolerance Patient tolerated treatment well   Behavior During Therapy [facility] for tasks assessed/performed      No past medical history on file.  No past surgical history on file.  There were no vitals filed for this visit.  Visit Diagnosis:  Weakness generalized  Lack of coordination      Subjective Assessment - 02/05/16 1711    Subjective  Pt. reports she is still waiting to hear if she was accepted at [employer].   Patient is accompained by: Family member   Patient Stated Goals wants to be independent with all tasks so she will be able to go away to college.   Currently in Pain? No/denies   Multiple Pain Sites No                      OT Treatments/Exercises (OP) - 02/05/16 1733    ADLs   ADL Comments Pt. worked on braiding tasks with her bilateral arms elevated throughout the full duration. 1# cuff weights were added.  Pt. required rest breaks.    Neurological Re-education Exercises   Other Exercises 1 Pt. participated in UE there. Ex. with red theraband. Pt. worked on resistive ex for shoulder flexion, abduction, diagonal flexion, elbow extension. 2 sets 15-20 reps each. 2# dumbbell ex. for elbow flexion and extension,  2# for forearm supination/pronation, wrist flexion/extension, and radial deviation. Pt. requires cues for proper technique.   Other Exercises 2  Pt. worked on the [employer] with resistive grasp for  supination and lateral pinch. [place] Board was placed in various elevated and inverted angles to work on movements simulated in  hair care and braiding.                OT Education - 02/05/16 1714    Education provided Yes   Person(s) Educated Patient   Methods Explanation   Comprehension Verbalized understanding             OT Long Term Goals - 01/21/16 0932    OT LONG TERM GOAL #1   Title Patient will demonstrate methods of picking up her bookbag and placing it onto the back of her chair with modified independence to transport items to class.   Baseline requires assistance from others to complete now but will need to be independent when living alone at college.   Time 6   Period Months   Status New   OT LONG TERM GOAL #2   Title Patient will be able to make her bed with modified independence.   Time 6   Period Months   Status New   OT LONG TERM GOAL #3   Title Patient will demonstrate the ability to perform vacuuming of one room with modified independence using light weight vacuum.   Baseline unable to perform currently.   Time 6   Period Months  Status New   OT LONG TERM GOAL #4   Title Patient will demonstrate the ability to hang clothes onto hangers to place in the closet with modified independence.   Baseline requires help from mom at this time, moderate assist   Time 6   Period Months   Status On-going   OT LONG TERM GOAL #5   Title Patient will improve grip strength to be able to achieve functional goals listed   Time 12   Period Months   Status Achieved   OT LONG TERM GOAL #6   Title Will now be able to go through a door that swings out using motorized wheel chair.   Baseline difficulty depending on type of door and set up of the area   Time 6   Period Months   Status On-going   OT LONG TERM GOAL #7   Title (Higher level grip) Will be able to squeeze a lemon during cooking.   Baseline cannot fully squeeze lemon to get juice out, exploring the use of  a lemon press to assist   Time 6   Period Months   Status Partially Met   OT LONG TERM GOAL #8   Title Patient will be able to donn night leg brace independently before she goes to college.   Time 6   Period Months   Status Achieved   OT LONG TERM GOAL  #9   Baseline Patient will be able to braid her own hair before college   Time 6   Period Months   Status On-going   OT LONG TERM GOAL  #10   TITLE Wll be able to wash her own hair before starting college   Baseline mom still has to help with more than 50% of task.  Unable to hold arms up long enough to complete and get shampoo out of long hair.   Time 6   Period Months   Status On-going   OT LONG TERM GOAL  #11   TITLE Will be able to squeeze tooth paste out of the tube when it is less than half full.   Baseline still requires assist if tube is less than 1/2 full.   Time 6   Period Months   Status Partially Met   OT LONG TERM GOAL  #12   TITLE Patient will be able to donn/doff crutch tips.   Baseline crutch tips are difficult for anyone to take them off, goal deferred.   Time 6   Period Months   Status Not Met   OT LONG TERM GOAL  #13   TITLE Patient will be able to complete 2 1/2 minutes straight with left shoulder flexion simulating driving with a stearing wheel ball to be able to drive to college 1 hr 40 min away   Baseline can do 30 second of shoulder flexion with 2 1/2 before fatigue and can only drive 15 min before right shoulder fatigues.   Time 6   Period Months   Status On-going   OT LONG TERM GOAL  #14   TITLE Will be able to open taped boxes (for moving to college) using a key or knife.   Baseline ongoing techniques explored with adaptive devices to assist.   Time 6   Period Months   Status On-going   OT LONG TERM GOAL  #15   TITLE  cut layers of paper which she will have to do for craft classes in college.   Time 6  Period Months   Status Achieved               Plan - 02/05/16 1715    Clinical  Impression Statement Pt. continues to have difficulty with perfroming tasks that require sustained shoulder flexion/UE elevation while using bilateral hands. Pt. does fatigue during tasks these tasks. Pt. continues to require skilled OT services to work on these movements so that the patient may perform haircare tasks successfully and efficiently. Pt. is working towards being able to go away to college. and will need to be able to complete these tasks.   Pt will benefit from skilled therapeutic intervention in order to improve on the following deficits (Retired) Impaired UE functional use;Difficulty walking;Decreased strength;Decreased coordination;Decreased endurance   Rehab Potential Good   OT Frequency 1x / week   OT Duration 12 weeks   OT Treatment/Interventions Self-care/ADL training;Therapeutic exercise;Therapeutic exercises;Therapeutic activities   OT Home Exercise Plan Putty and stress ball exercises.   Consulted and Agree with Plan of Care Patient;Family member/caregiver   Family Member Consulted Dad        Problem List There are no active problems to display for this patient.  Harrel Carina, MS, OTR/L   Harrel Carina 02/05/2016, 5:35 PM  [place] MAIN [facility] SERVICES [address] [place], [place], [address] Phone: [phone]   Fax:  [phone]  Name: EVALEEN SANT MRN: [id number] Date of Birth: [birth year]

## 2016-02-09 NOTE — Therapy (Addendum)
[place] MAIN [facility] SERVICES [address] [place], [place], [address] Phone: [phone]   Fax:  [phone]  Physical Therapy Treatment  Patient Details  Name: Michelle Randall MRN: [id number] Date of Birth: 1996-09-29 No Data Recorded  Encounter Date: 02/02/2016    History reviewed. No pertinent past medical history.  History reviewed. No pertinent past surgical history.  There were no vitals filed for this visit.  Visit Diagnosis:  Muscle weakness  Lack of coordination  Difficulty walking  Personal history of fall  Patient is progressing towards all goals and continues to benefit from skilled PT to improve posture during gait, and decrease her falls risk.  She has a score of 12/24 on her  DGI, and . 8 m/sec gait speed both outcome meausres indicating a fall risk.  She is recently having difficulty raising up her RLE to ascend steps safety, and has caught her foot causing her to stumble.  She will be going to a college campus this August after she graduates from high school and needs to be safe on ascending and descending a curb and performing car transfers. She needs supervision with car transfers and continues to have LE weakness in both hips and knees. She will benefit from skilled PT to improve ambulation skills on uneven surfaces and inclines, and continuing  to practice getting up from the ground level, and to progress her HEP as she transitions to a college campus..       PT Long Term Goals - 02/09/16 0854    PT LONG TERM GOAL #1   Title Patient will improve Dynamic Gait Index (DGI) score to > 19/24 for low falls risk regarding dynamic walking tasks    Time 26   Period Weeks   Status Partially Met   PT LONG TERM GOAL #2   Title Patient will increase Berg Balance score by > 6 points to demonstrate decreased fall risk during functional activities    Time 26   Period Weeks   Status Partially Met   PT LONG TERM GOAL #3   Title  Patient will be able to transfer in and out of a large car with Patient will be able to transfer in and out of a high seated car with CGA    Time 26   Period Weeks   Status Partially Met   PT LONG TERM GOAL #4   Title Patient will complete a TUG test in < 12 seconds for independent mobility and decreased fall risk    Time 26   Period Weeks   Status Achieved        OUTCOME MEASURES: TEST Outcome Interpretation  5 times sit<>stand 9.40sec >60 yo, >15 sec indicates increased risk for falls  10 meter walk test   .80              m/s <1.0 m/s indicates increased risk for falls; limited community ambulator  Timed up and Go    12.00             sec <14 sec indicates increased risk for falls  6 minute walk test 860               Feet 1000 feet is community ambulator  DGI  12/24 24/24 is normal  PT Long Term Goals - 11/05/15 1809    PT LONG TERM GOAL #1   Status On-going   PT LONG TERM GOAL #2   Status On-going   PT LONG TERM GOAL #3   Status Partially Met   PT LONG TERM GOAL #4   Status Achieved   PT LONG TERM GOAL #5   Status Partially Met   Additional Long Term Goals   Additional Long Term Goals Yes   PT LONG TERM GOAL #6   Status Partially Met   PT LONG TERM GOAL #7   Status Achieved   PT LONG TERM GOAL #8   Title Patient will be able to ambulate on inclines and grass independenlty with LRAD   Time 6   Period Months   Status New   PT LONG TERM GOAL  #9   TITLE Patient will be abe to transfer from low chair or stool wihtout UE support independently   Time 6   Period Months   Status New   PT LONG TERM GOAL  #10   TITLE Patient will be able to transfer from the floor to standing independently with use of LRAD   Time 6   Period Months   Status New               Problem List There are no active problems to display for this patient.   Alanson Puls 02/09/2016, 8:45 AM  [place] MAIN [facility] SERVICES [place], [place], [address] Phone: [phone]   Fax:  [phone]  Name: Michelle Randall MRN: [id number] Date of Birth: 1996/02/18

## 2016-02-11 ENCOUNTER — Ambulatory Visit: Payer: Medicaid Other | Admitting: Occupational Therapy

## 2016-02-11 DIAGNOSIS — R278 Other lack of coordination: Secondary | ICD-10-CM

## 2016-02-11 DIAGNOSIS — M6281 Muscle weakness (generalized): Secondary | ICD-10-CM | POA: Diagnosis not present

## 2016-02-11 NOTE — Therapy (Signed)
[place] [facility] [address] [place], [place], [address] Phone: [phone]   Fax:  [phone]  Occupational Therapy Treatment  Patient Details  Name: Michelle HuaMisbah N Romanello MRN: [id number] Date of Birth: 10/21/1996 No Data Recorded  Encounter Date: 02/11/2016      OT End of Session - 02/11/16 1744    Visit Number 11   Number of Visits 24   Date for OT Re-Evaluation 04/13/16   Authorization Type medicaid   OT Start Time 1310   OT Stop Time 1330   OT Time Calculation (min) 20 min      No past medical history on file.  No past surgical history on file.  There were no vitals filed for this visit.  Visit Diagnosis:  Other lack of coordination      Subjective Assessment - 02/11/16 1321    Subjective  Pt. reports reports having difficulty tying her shoes that have short laces.   Patient Stated Goals wants to be independent with all tasks so she will be able to go away to college.      OT TREATMENT    Selfcare: Pt. worked on simulating  tying shoes with short laces. Pt. Worked with varying sizes and thickness of laces and rope. Pt. presented with difficulty manipulating short laces to tie bows.                                OT Long Term Goals - 02/11/16 1315    Long Term Additional Goals   Additional Long Term Goals Yes               Plan - 02/11/16 1744    Clinical Impression Statement Pt. continues to present with BUE weakness for tasks requiring sustained overhead reaching. Pt. performed  Pt. is having difficulty tying shoes with short laces, and manipulating tying with shortened laces.   Pt will benefit from skilled therapeutic intervention in order to improve on the following deficits (Retired) Impaired UE functional use;Difficulty walking;Decreased strength;Decreased coordination;Decreased endurance   Rehab Potential Good   OT Frequency 1x / week   OT Duration 12 weeks   OT  Treatment/Interventions Self-care/ADL training;Therapeutic exercise;Therapeutic exercises;Therapeutic activities   Consulted and Agree with Plan of Care Patient;Family member/caregiver   Family Member Consulted Mom        Problem List There are no active problems to display for this patient. Olegario MessierElaine Carrye Goller, MS, OTR/L   Olegario MessierElaine Herberto Ledwell 02/11/2016, 5:54 PM  [place] [facility] MAIN [facility] [address] [place], [place], [address] Phone: [phone]   Fax:  [phone]  Name: Michelle HuaMisbah N Peet MRN: [id number] Date of Birth: 10/21/1996

## 2016-02-11 NOTE — Patient Instructions (Signed)
OT TREATMENT    Selfcare: Pt. worked on simulating  tying shoes with short laces. Pt. Worked with varying sizes and thickness of laces and rope. Pt. presented with difficulty manipulating short laces to tie bows.

## 2016-02-12 ENCOUNTER — Ambulatory Visit: Payer: Medicaid Other | Admitting: Occupational Therapy

## 2016-02-19 ENCOUNTER — Encounter: Payer: Self-pay | Admitting: Occupational Therapy

## 2016-02-19 ENCOUNTER — Ambulatory Visit: Payer: Medicaid Other | Attending: Pediatrics | Admitting: Occupational Therapy

## 2016-02-19 DIAGNOSIS — R5381 Other malaise: Secondary | ICD-10-CM | POA: Insufficient documentation

## 2016-02-19 DIAGNOSIS — R278 Other lack of coordination: Secondary | ICD-10-CM | POA: Diagnosis present

## 2016-02-19 DIAGNOSIS — R262 Difficulty in walking, not elsewhere classified: Secondary | ICD-10-CM | POA: Insufficient documentation

## 2016-02-19 DIAGNOSIS — R6889 Other general symptoms and signs: Secondary | ICD-10-CM

## 2016-02-19 DIAGNOSIS — R531 Weakness: Secondary | ICD-10-CM | POA: Diagnosis present

## 2016-02-19 DIAGNOSIS — M6281 Muscle weakness (generalized): Secondary | ICD-10-CM | POA: Diagnosis present

## 2016-02-19 DIAGNOSIS — R460 Very low level of personal hygiene: Secondary | ICD-10-CM | POA: Diagnosis present

## 2016-02-19 DIAGNOSIS — Z741 Need for assistance with personal care: Secondary | ICD-10-CM

## 2016-02-19 DIAGNOSIS — Z789 Other specified health status: Secondary | ICD-10-CM

## 2016-02-20 NOTE — Therapy (Signed)
[place] [facility] MAIN [facility] SERVICES [address] [place], [place], [address] Phone: [phone]   Fax:  [phone]  Occupational Therapy Treatment  Patient Details  Name: Michelle Randall MRN: [id number] Date of Birth: Mar 24, 1996 No Data Recorded  Encounter Date: 02/19/2016      OT End of Session - 02/20/16 1657    Visit Number 12   Number of Visits 24   Date for OT Re-Evaluation 04/13/16   Authorization Type medicaid   OT Start Time 0831   OT Stop Time 0915   OT Time Calculation (min) 44 min   Activity Tolerance Patient tolerated treatment well   Behavior During Therapy [facility] for tasks assessed/performed      History reviewed. No pertinent past medical history.  History reviewed. No pertinent past surgical history.  There were no vitals filed for this visit.      Subjective Assessment - 02/20/16 1648    Subjective  Patient reports she has a new goal of tying shorter laces of some new shoes.     Patient Stated Goals wants to be independent with all tasks so she will be able to go away to college.                      OT Treatments/Exercises (OP) - 02/20/16 1716    ADLs   ADL Comments Patient seen for shoe lacing/tying with short laces with cues from therapist and demo   Fine Motor Coordination   Other Fine Motor Exercises Patient seen for manipulation of small objects from Purdue pegboard with small dowels, collars and washers, placing with right hand and removing pieces, using translatory skills of the hand, using the hand for storage and then moving pieces out of palm with isolated finger movements to sort 3 different pieces, one at a time.    Neurological Re-education Exercises   Other Exercises 1 Patient seen for strengthening tasks with red theraband for 2 sets of 12 reps for shoulder flexion, diagonal patterns, elbow flexion/extension. Cues for proper form and technique and occasional guiding from therapist. She has  difficulty with placing band under her foot and keeping secure for elbow flexion exercise.                OT Education - 02/20/16 1650    Education provided Yes             OT Long Term Goals - 02/11/16 1315    Long Term Additional Goals   Additional Long Term Goals Yes               Plan - 02/20/16 1657    Clinical Impression Statement Patient continues to work towards improving strength, coordination as well as compensatory strategies to improve independence which is pertinent to her transition to going to college in the fall and to be independent.  She has been making continued progress towards goals and would benefit from continued skilled OT to maximize her safety and independence in daily tasks.    Rehab Potential Good   OT Frequency 1x / week   OT Duration 12 weeks   OT Treatment/Interventions Self-care/ADL training;Therapeutic exercise;Therapeutic exercises;Therapeutic activities   Consulted and Agree with Plan of Care Patient;Family member/caregiver   Family Member Consulted Mom      Patient will benefit from skilled therapeutic intervention in order to improve the following deficits and impairments:  Impaired UE functional use, Difficulty walking, Decreased strength, Decreased coordination, Decreased endurance  Visit  Diagnosis: Other lack of coordination  Weakness generalized  Self-care deficit for dressing and grooming  Impaired instrumental activities of daily living (IADL)    Problem List There are no active problems to display for this patient. Kerrie Buffalomy T Jonnie Kubly, OTR/L, CLT   Ryn Peine 02/20/2016, 5:18 PM  [place] [facility] [address] [place], [place], [address] Phone: [phone]   Fax:  [phone]  Name: Michelle Randall MRN: [id number] Date of Birth: [birth year]

## 2016-02-25 ENCOUNTER — Ambulatory Visit: Payer: Medicaid Other | Admitting: Physical Therapy

## 2016-02-25 ENCOUNTER — Encounter: Payer: Self-pay | Admitting: Occupational Therapy

## 2016-02-25 ENCOUNTER — Ambulatory Visit: Payer: Medicaid Other | Admitting: Occupational Therapy

## 2016-02-25 DIAGNOSIS — R262 Difficulty in walking, not elsewhere classified: Secondary | ICD-10-CM

## 2016-02-25 DIAGNOSIS — R278 Other lack of coordination: Secondary | ICD-10-CM

## 2016-02-25 DIAGNOSIS — M6281 Muscle weakness (generalized): Secondary | ICD-10-CM

## 2016-02-25 NOTE — Therapy (Signed)
[facility] [address] [place], [place], [address] Phone: [phone]   Fax:  [phone]  Physical Therapy Treatment  Patient Details  Name: Michelle HuaMisbah N Hyson MRN: [id number] Date of Birth: Aug 23, 1996 No Data Recorded  Encounter Date: 02/25/2016      PT End of Session - 02/25/16 1534    Visit Number 32   Number of Visits 44   Date for PT Re-Evaluation 05/19/16   Authorization Type medicaid   PT Start Time 1346   PT Stop Time 1429   PT Time Calculation (min) 43 min   Activity Tolerance Patient tolerated treatment well   Behavior During Therapy [facility] for tasks assessed/performed      No past medical history on file.  No past surgical history on file.  There were no vitals filed for this visit.      Subjective Assessment - 02/25/16 1354    Subjective Pt is doing well and reports no falls.    Patient is accompained by: Family member   Limitations Walking   Patient Stated Goals Patient wants to improve her core strength.    Currently in Pain? No/denies      Warm up: Nustep L2 x 5 min (unbilled)  Therex: Quantum leg press 100# 3/10 Standing hip flex, SLR, abd, ext with 2# 3x10 each Eccentric step down x10 each Mini squats x 10 Cues for set up and proper technique of exercises to target appropriate muscles. Therapeutic rest breaks for energy conservation.                           PT Education - 02/25/16 1531    Education provided Yes   Education Details strength training    Person(s) Educated Patient   Methods Explanation   Comprehension Verbalized understanding             PT Long Term Goals - 02/25/16 1540    PT LONG TERM GOAL #1   Title Patient will improve Dynamic Gait Index (DGI) score to > 19/24 for low falls risk regarding dynamic walking tasks    Time 12   Period Weeks   Status On-going   PT LONG TERM GOAL #2   Title Patient will increase Berg Balance score by  > 6 points to demonstrate decreased fall risk during functional activities    Time 12   Period Weeks   Status On-going   PT LONG TERM GOAL #3   Title Patient will be able to transfer in and out of a large car with Patient will be able to transfer in and out of a high seated car with CGA    Time 12   Period Weeks   Status On-going   PT LONG TERM GOAL #4   Title Patient will complete a TUG test in < 12 seconds for independent mobility and decreased fall risk    Time 12   Period Weeks   Status On-going   PT LONG TERM GOAL #5   Time 12   Status On-going   PT LONG TERM GOAL #6   Time 12   Status On-going   PT LONG TERM GOAL #8   Time 12   Period Weeks   Status On-going   PT LONG TERM GOAL  #9   Time 12   Period Weeks   Status On-going   PT LONG TERM GOAL  #10   Time 12   Period Weeks  Status On-going               Plan - 02/25/16 1537    Clinical Impression Statement Pt demonstrated need for continued strengthening to increase functional mobility and independence. Increased time required to complete strengthening exercises due to fatigue.   Rehab Potential Good   Clinical Impairments Affecting Rehab Potential weakness and decreased standing balance   PT Frequency 1x / week   PT Duration 12 weeks   PT Treatment/Interventions Therapeutic exercise;Therapeutic activities;Gait training;Balance training;Stair training;DME Instruction;Neuromuscular re-education;Patient/family education   PT Next Visit Plan Continue with core and LE strengthening and TM ambulation   PT Home Exercise Plan Continued from previous sessions.    Consulted and Agree with Plan of Care Patient      Patient will benefit from skilled therapeutic intervention in order to improve the following deficits and impairments:  Abnormal gait, Decreased balance, Decreased endurance, Difficulty walking, Decreased strength  Visit Diagnosis: Muscle weakness (generalized) - Plan: PT plan of care  cert/re-cert  Difficulty in walking, not elsewhere classified - Plan: PT plan of care cert/re-cert     Problem List There are no active problems to display for this patient.   Ellia Knowlton Lucillie Garfinkel, PT, DPT  02/25/2016, 3:48 PM [phone]  [facility] MAIN [facility] SERVICES [address] [place], [place], [address] Phone: [phone]   Fax:  [phone]  Name: VICTOR GRANADOS MRN: [id number] Date of Birth: Dec 27, 1995

## 2016-02-25 NOTE — Patient Instructions (Addendum)
Warm up: Nustep L2 x 5 min (unbilled)  Therex: Quantum leg press 100# 3/10 Standing hip flex, SLR, abd, ext with 2# 3x10 each Eccentric step down x10 each Mini squats x 10 Cues for set up and proper technique of exercises to target appropriate muscles. Therapeutic rest breaks for energy conservation.

## 2016-02-26 ENCOUNTER — Encounter: Payer: Medicaid Other | Admitting: Occupational Therapy

## 2016-02-27 NOTE — Therapy (Signed)
[place] MAIN [facility] SERVICES [address] [place], [place], [address] Phone: [phone]   Fax:  [phone]  Occupational Therapy Treatment  Patient Details  Name: Michelle Randall MRN: [id number] Date of Birth: Mar 17, 1996 No Data Recorded  Encounter Date: 02/25/2016      OT End of Session - 02/27/16 1106    Visit Number 13   Number of Visits 24   Date for OT Re-Evaluation 04/13/16   Authorization Type medicaid   OT Start Time 1430   OT Stop Time 1515   OT Time Calculation (min) 45 min   Equipment Utilized During Treatment lemon press   Activity Tolerance Patient tolerated treatment well   Behavior During Therapy [facility] for tasks assessed/performed      History reviewed. No pertinent past medical history.  History reviewed. No pertinent past surgical history.  There were no vitals filed for this visit.      Subjective Assessment - 02/26/16 1103    Subjective  Patient reports she is still having trouble with the new shoes with shorter laces,  "it is a little better but still hard."   Patient Stated Goals wants to be independent with all tasks so she will be able to go away to college.                      OT Treatments/Exercises (OP) - 02/27/16 1113    ADLs   ADL Comments Patient seen this date for focus on ADL tasks as follows:  Patient instructed on use of lemon press for squeezing lemon juice and able to demo use with cues and instruction.  Information provided to patient on where to purchase for use at home.  Patient instructed on a variety of ways to hang clothing from a seated position, placing pants and shirts onto clothes hangers with clothing laid out in lap rather than trying to hold rack overhead and trying to pull.  Multiple trials completed and patient pleased with outcome.  Pt seen for braiding with arms overhead for length of the braid, first trial normally, then second trial with vision occluded since her  goal is to complete rear braid of her hair.     Fine Motor Coordination   Other Fine Motor Exercises Patient seen for manipulation of small objects from Purdue pegboard with small dowels, collars and washers, placing with right hand and removing pieces, using translatory skills of the hand, using the hand for storage and then moving pieces out of palm with isolated finger movements to sort 3 different pieces, one at a time.                 OT Education - 02/27/16 1104    Education provided Yes   Education Details hair braiding, use of lemon press for lemon juice   Person(s) Educated Patient   Methods Explanation;Demonstration;Verbal cues   Comprehension Verbal cues required;Returned demonstration;Verbalized understanding             OT Long Term Goals - 02/26/16 1105    OT LONG TERM GOAL #1   Title Patient will demonstrate methods of picking up her bookbag and placing it onto the back of her chair with modified independence to transport items to class.   Baseline requires assistance from others to complete now but will need to be independent when living alone at college.   Time 6   Period Months   Status On-going   OT LONG TERM GOAL #  2   Title Patient will be able to make her bed with modified independence.   Time 6   Period Months   Status On-going   OT LONG TERM GOAL #3   Title Patient will demonstrate the ability to perform vacuuming of one room with modified independence using light weight vacuum.   Baseline unable to perform currently.   Time 6   Period Months   Status On-going   OT LONG TERM GOAL #4   Title Patient will demonstrate the ability to hang clothes onto hangers to place in the closet with modified independence.   Baseline requires help from mom at this time, moderate assist with pants   Time 6   Period Months   Status On-going   OT LONG TERM GOAL #5   Title Patient will improve grip strength to be able to achieve functional goals listed   Time 12    Period Months   Status Achieved   OT LONG TERM GOAL #6   Title Will now be able to go through a door that swings out using motorized wheel chair.   Baseline difficulty depending on type of door and set up of the area   Time 6   Period Months   Status On-going   OT LONG TERM GOAL #7   Title (Higher level grip) Will be able to squeeze a lemon during cooking.   Baseline cannot fully squeeze lemon to get juice out, exploring the use of a lemon press to assist   Time 6   Period Months   Status Partially Met   OT LONG TERM GOAL #8   Title Patient will be able to donn night leg brace independently before she goes to college.   Time 6   Period Months   Status Achieved   OT LONG TERM GOAL  #9   Baseline Patient will be able to braid her own hair before college   Time 6   Period Months   Status On-going   OT LONG TERM GOAL  #10   TITLE Wll be able to wash her own hair before starting college   Baseline mom still has to help with more than 50% of task.  Unable to hold arms up long enough to complete and get shampoo out of long hair.   Time 6   Period Months   Status On-going   OT LONG TERM GOAL  #11   TITLE Will be able to squeeze tooth paste out of the tube when it is less than half full.   Baseline still requires assist if tube is less than 1/2 full.   Time 6   Period Months   Status Partially Met   OT LONG TERM GOAL  #12   TITLE Patient will be able to donn/doff crutch tips.   Baseline crutch tips are difficult for anyone to take them off, goal deferred.   Time 6   Period Months   Status Not Met   OT LONG TERM GOAL  #13   TITLE Patient will be able to complete 2 1/2 minutes straight with left shoulder flexion simulating driving with a stearing wheel ball to be able to drive to college 1 hr 40 min away   Baseline can do 30 second of shoulder flexion with 2 1/2 before fatigue and can only drive 15 min before right shoulder fatigues.   Time 6   Period Months   Status On-going    OT LONG TERM GOAL  #14  TITLE Will be able to open taped boxes (for moving to college) using a key or knife.   Baseline ongoing techniques explored with adaptive devices to assist.   Time 6   Period Months   Status On-going   OT LONG TERM GOAL  #15   TITLE  cut layers of paper which she will have to do for craft classes in college.   Time 6   Period Months   Status Achieved               Plan - 02/27/16 1107    Clinical Impression Statement Patient has continued to make good progress towards goals in POC to increase independence in tasks especially since she will be graduating this year and may be going away for college and will need to be fully independent. She was able to demo the use of a lemon press this date to assist with squeezing lemons for cooking at  home since she has not been able to complete squeezing with the use of her hands.  She is becoming more proficient with braiding for her hair and able to complete with vision occluded but needs additional instruction to do it with arms towards the back of her head for rear self braid.  She was able to demo alternative ways to manage hanging clothing on clothes hangers from seated position this date.  She would continue to benefit  from skilled OT to maximize safety and independence in necessary daily tasks to progress to independent living at college.    Rehab Potential Good   OT Frequency 1x / week   OT Duration 12 weeks   OT Treatment/Interventions Self-care/ADL training;Therapeutic exercise;Therapeutic exercises;Therapeutic activities   Consulted and Agree with Plan of Care Patient      Patient will benefit from skilled therapeutic intervention in order to improve the following deficits and impairments:  Impaired UE functional use, Difficulty walking, Decreased strength, Decreased coordination, Decreased endurance  Visit Diagnosis: Muscle weakness (generalized)  Other lack of coordination    Problem List There  are no active problems to display for this patient.  Achilles Dunk, OTR/L, CLT  Lovett,Amy 02/27/2016, 11:21 AM  [place] MAIN [facility] SERVICES [address] [place], [place], [address] Phone: [phone]   Fax:  [phone]  Name: Michelle Randall MRN: [id number] Date of Birth: Sep 17, 1996

## 2016-03-01 ENCOUNTER — Ambulatory Visit: Payer: Medicaid Other

## 2016-03-01 DIAGNOSIS — R278 Other lack of coordination: Secondary | ICD-10-CM | POA: Diagnosis not present

## 2016-03-01 DIAGNOSIS — M6281 Muscle weakness (generalized): Secondary | ICD-10-CM

## 2016-03-01 NOTE — Therapy (Signed)
[place] [facility] MAIN [facility] [address] [place], [place], [address] Phone: [phone]   Fax:  [phone]  Physical Therapy Treatment  Patient Details  Name: Michelle Randall MRN: [id number] Date of Birth: 1996/02/13 No Data Recorded  Encounter Date: 03/01/2016      PT End of Session - 03/01/16 1745    Visit Number 33   Number of Visits 44   Date for PT Re-Evaluation 05/19/16   Authorization Type medicaid   PT Start Time 1700   PT Stop Time 1730   PT Time Calculation (min) 30 min   Activity Tolerance Patient tolerated treatment well   Behavior During Therapy [facility] for tasks assessed/performed      History reviewed. No pertinent past medical history.  History reviewed. No pertinent past surgical history.  There were no vitals filed for this visit.      Subjective Assessment - 03/01/16 1745    Subjective pt reports she would like to get stronger and do curbs better   Patient is accompained by: Family member   Limitations Walking   Patient Stated Goals Patient wants to improve her core strength.    Currently in Pain? No/denies     therex: Nustep L 3 x 3 min no charge Curb navigation with lofstrand fwd vs sideways . CGA for safety x 3 min Sit to stand with 2lb med ball with mat table at lowest level 2x10 SLR 2x10 BLE Hip abduction with assist 4x5 BLE Hip extension in quadriped over T ball 4x5 each leg Bridges with feet on BOSU 2x10 cues to keep knees apart Pt requires min verbal and tactile cues for proper exercise performance   Pt requires min A for some exercises particularily with RLE                                 PT Long Term Goals - 02/25/16 1540    PT LONG TERM GOAL #1   Title Patient will improve Dynamic Gait Index (DGI) score to > 19/24 for low falls risk regarding dynamic walking tasks    Time 12   Period Weeks   Status On-going   PT LONG TERM GOAL #2   Title Patient will  increase Berg Balance score by > 6 points to demonstrate decreased fall risk during functional activities    Time 12   Period Weeks   Status On-going   PT LONG TERM GOAL #3   Title Patient will be able to transfer in and out of a large car with Patient will be able to transfer in and out of a high seated car with CGA    Time 12   Period Weeks   Status On-going   PT LONG TERM GOAL #4   Title Patient will complete a TUG test in < 12 seconds for independent mobility and decreased fall risk    Time 12   Period Weeks   Status On-going   PT LONG TERM GOAL #5   Time 12   Status On-going   PT LONG TERM GOAL #6   Time 12   Status On-going   PT LONG TERM GOAL #8   Time 12   Period Weeks   Status On-going   PT LONG TERM GOAL  #9   Time 12   Period Weeks   Status On-going   PT LONG TERM GOAL  #10   Time 12  Period Weeks   Status On-going               Plan - 03/01/16 1745    Clinical Impression Statement pt arrived 15 min late for apt. pt had difficulty with hip strengthening particularly hamstring and glute. instructed pt in side stepping up onto curb as an alternative to [employer]  college campus   Rehab Potential Good   Clinical Impairments Affecting Rehab Potential weakness and decreased standing balance   PT Frequency 1x / week   PT Duration 12 weeks   PT Treatment/Interventions Therapeutic exercise;Therapeutic activities;Gait training;Balance training;Stair training;DME Instruction;Neuromuscular re-education;Patient/family education   PT Next Visit Plan Continue with core and LE strengthening and TM ambulation   PT Home Exercise Plan Continued from previous sessions.    Consulted and Agree with Plan of Care Patient      Patient will benefit from skilled therapeutic intervention in order to improve the following deficits and impairments:  Abnormal gait, Decreased balance, Decreased endurance, Difficulty walking, Decreased strength  Visit Diagnosis: Muscle weakness  (generalized)     Problem List There are no active problems to display for this patient.  Carlyon Shadow. Kamaya Keckler, PT, DPT [id number]  Emerly Prak 03/01/2016, 5:48 PM  [place] [facility] MAIN [facility] SERVICES [address] [place], [place], [address] Phone: [phone]   Fax:  [phone]  Name: Michelle Randall MRN: [id number] Date of Birth: [birth year]

## 2016-03-03 ENCOUNTER — Ambulatory Visit: Payer: Medicaid Other | Admitting: Occupational Therapy

## 2016-03-03 DIAGNOSIS — M6281 Muscle weakness (generalized): Secondary | ICD-10-CM

## 2016-03-03 DIAGNOSIS — R278 Other lack of coordination: Secondary | ICD-10-CM

## 2016-03-05 NOTE — Therapy (Signed)
[place] MAIN [facility] SERVICES [address] [place], [place], [address] Phone: [phone]   Fax:  [phone]  Occupational Therapy Treatment  Patient Details  Name: Michelle Randall MRN: [id number] Date of Birth: [birth year] No Data Recorded  Encounter Date: 03/03/2016      OT End of Session - 03/05/16 0900    Visit Number 14   Number of Visits 24   Date for OT Re-Evaluation 04/13/16   Authorization Type medicaid   OT Start Time 0831   OT Stop Time 0917   OT Time Calculation (min) 46 min   Activity Tolerance Patient tolerated treatment well   Behavior During Therapy [facility] for tasks assessed/performed      No past medical history on file.  No past surgical history on file.  There were no vitals filed for this visit.      Subjective Assessment - 03/05/16 0855    Subjective  Reports she just relaxed for Easter, had family around for the holiday.  Did try the folding and hanging techniques at home and felt they went well and was able to complete the task.    Patient Stated Goals wants to be independent with all tasks so she will be able to go away to college.                      OT Treatments/Exercises (OP) - 03/05/16 5170    Fine Motor Coordination   Other Fine Motor Exercises Patient seen this date for focus on fine motor coordination skills with manipulation.  [place] discs for turning and flipping with cues and use of one hand only, reaching and placing onto elevated surface.     Neurological Re-education Exercises   Other Exercises 1 Patient seen this date for strengthening tasks of multi directional reaching with SAEBO tower with right UE 1# weight on wrist and Lwrist with 3/4# weight for up to 3 levels and for 5 sets.  Resistive pinch pins from yellow to green and placing onto elevated surface with both right and left hands with no added wrist weights. 3# dowel exercises for shoulder flexion, ABD/ADD, chest press,  forwards and backwards circles for 12 reps for 2 sets each exercise performed in sitting.                  OT Education - 03/05/16 0900    Education provided Yes   Education Details HEP   Person(s) Educated Patient   Methods Explanation;Demonstration;Verbal cues   Comprehension Verbal cues required;Returned demonstration;Verbalized understanding             OT Long Term Goals - 02/26/16 1105    OT LONG TERM GOAL #1   Title Patient will demonstrate methods of picking up her bookbag and placing it onto the back of her chair with modified independence to transport items to class.   Baseline requires assistance from others to complete now but will need to be independent when living alone at college.   Time 6   Period Months   Status On-going   OT LONG TERM GOAL #2   Title Patient will be able to make her bed with modified independence.   Time 6   Period Months   Status On-going   OT LONG TERM GOAL #3   Title Patient will demonstrate the ability to perform vacuuming of one room with modified independence using light weight vacuum.   Baseline unable to perform currently.   Time  6   Period Months   Status On-going   OT LONG TERM GOAL #4   Title Patient will demonstrate the ability to hang clothes onto hangers to place in the closet with modified independence.   Baseline requires help from mom at this time, moderate assist with pants   Time 6   Period Months   Status On-going   OT LONG TERM GOAL #5   Title Patient will improve grip strength to be able to achieve functional goals listed   Time 12   Period Months   Status Achieved   OT LONG TERM GOAL #6   Title Will now be able to go through a door that swings out using motorized wheel chair.   Baseline difficulty depending on type of door and set up of the area   Time 6   Period Months   Status On-going   OT LONG TERM GOAL #7   Title (Higher level grip) Will be able to squeeze a lemon during cooking.   Baseline  cannot fully squeeze lemon to get juice out, exploring the use of a lemon press to assist   Time 6   Period Months   Status Partially Met   OT LONG TERM GOAL #8   Title Patient will be able to donn night leg brace independently before she goes to college.   Time 6   Period Months   Status Achieved   OT LONG TERM GOAL  #9   Baseline Patient will be able to braid her own hair before college   Time 6   Period Months   Status On-going   OT LONG TERM GOAL  #10   TITLE Wll be able to wash her own hair before starting college   Baseline mom still has to help with more than 50% of task.  Unable to hold arms up long enough to complete and get shampoo out of long hair.   Time 6   Period Months   Status On-going   OT LONG TERM GOAL  #11   TITLE Will be able to squeeze tooth paste out of the tube when it is less than half full.   Baseline still requires assist if tube is less than 1/2 full.   Time 6   Period Months   Status Partially Met   OT LONG TERM GOAL  #12   TITLE Patient will be able to donn/doff crutch tips.   Baseline crutch tips are difficult for anyone to take them off, goal deferred.   Time 6   Period Months   Status Not Met   OT LONG TERM GOAL  #13   TITLE Patient will be able to complete 2 1/2 minutes straight with left shoulder flexion simulating driving with a stearing wheel ball to be able to drive to college 1 hr 40 min away   Baseline can do 30 second of shoulder flexion with 2 1/2 before fatigue and can only drive 15 min before right shoulder fatigues.   Time 6   Period Months   Status On-going   OT LONG TERM GOAL  #14   TITLE Will be able to open taped boxes (for moving to college) using a key or knife.   Baseline ongoing techniques explored with adaptive devices to assist.   Time 6   Period Months   Status On-going   OT LONG TERM GOAL  #15   TITLE  cut layers of paper which she will have to do for craft  classes in college.   Time 6   Period Months   Status  Achieved               Plan - 03/05/16 0901    Clinical Impression Statement Patient able to add weight to wrists to perform multi directional reaching and strength tasks.  Cues for manipulation of discs for isolated finger movements and turning.  Will continue to work towards improving strength, ROM, and coordination to perform daily tasks.    Rehab Potential Good   OT Frequency 1x / week   OT Duration 12 weeks   OT Treatment/Interventions Self-care/ADL training;Therapeutic exercise;Therapeutic exercises;Therapeutic activities   Consulted and Agree with Plan of Care Patient      Patient will benefit from skilled therapeutic intervention in order to improve the following deficits and impairments:  Impaired UE functional use, Difficulty walking, Decreased strength, Decreased coordination, Decreased endurance  Visit Diagnosis: Muscle weakness (generalized)  Other lack of coordination    Problem List There are no active problems to display for this patient.  Achilles Dunk, OTR/L, CLT  Geniva Lohnes 03/05/2016, 9:03 AM  [place] MAIN [facility] SERVICES [place], [place], [address] Phone: [phone]   Fax:  [phone]  Name: Michelle Randall MRN: [id number] Date of Birth: 01/24/1996

## 2016-03-08 ENCOUNTER — Ambulatory Visit: Payer: Medicaid Other | Admitting: Physical Therapy

## 2016-03-09 ENCOUNTER — Ambulatory Visit: Payer: Medicaid Other

## 2016-03-10 ENCOUNTER — Ambulatory Visit: Payer: Medicaid Other | Admitting: Occupational Therapy

## 2016-03-10 ENCOUNTER — Ambulatory Visit: Payer: Medicaid Other | Admitting: Physical Therapy

## 2016-03-10 ENCOUNTER — Encounter: Payer: Self-pay | Admitting: Physical Therapy

## 2016-03-10 DIAGNOSIS — R278 Other lack of coordination: Secondary | ICD-10-CM | POA: Diagnosis not present

## 2016-03-10 DIAGNOSIS — M6281 Muscle weakness (generalized): Secondary | ICD-10-CM

## 2016-03-10 DIAGNOSIS — R262 Difficulty in walking, not elsewhere classified: Secondary | ICD-10-CM

## 2016-03-10 NOTE — Therapy (Signed)
[place] MAIN [facility] SERVICES [address] [place], [place], [address] Phone: [phone]   Fax:  [phone]  Occupational Therapy Treatment  Patient Details  Name: Michelle Randall MRN: [id number] Date of Birth: 1996/06/19 No Data Recorded  Encounter Date: 03/10/2016      OT End of Session - 03/10/16 1416    Visit Number 15   Number of Visits 24   Date for OT Re-Evaluation 04/13/16   Authorization Type medicaid   OT Start Time 1350   OT Stop Time 1430   OT Time Calculation (min) 40 min   Activity Tolerance Patient tolerated treatment well   Behavior During Therapy [facility] for tasks assessed/performed      No past medical history on file.  No past surgical history on file.  There were no vitals filed for this visit.      Subjective Assessment - 03/10/16 1437    Subjective  Pt. reports having her AP course exams starting next week.   Patient is accompained by: Family member   Patient Stated Goals wants to be independent with all tasks so she will be able to go away to college.         OT TREATMENT    Neuro muscular re-education: Pt. Worked on grasping small 1/2" flat coins, and storing them in the palm of her hand. Pt. Worked on moving the coins from her palm to the tip of her thumb and 2nd digit to place it in the counter container. Pt. worked on using tweezers to grasp and move 1/8th inch pegs.   Selfcare: pt. worked on Librarian, academic at a vertical angle with 2# cuff weights in place. Pt. Worked on braiding with vision occluded to simulate being able to braid in the back of her hair.                         OT Education - 03/10/16 1437    Education provided Yes   Person(s) Educated Patient   Methods Explanation;Demonstration   Comprehension Verbalized understanding;Returned demonstration;Verbal cues required             OT Long Term Goals - 02/26/16 1105    OT LONG TERM GOAL #1   Title Patient will  demonstrate methods of picking up her bookbag and placing it onto the back of her chair with modified independence to transport items to class.   Baseline requires assistance from others to complete now but will need to be independent when living alone at college.   Time 6   Period Months   Status On-going   OT LONG TERM GOAL #2   Title Patient will be able to make her bed with modified independence.   Time 6   Period Months   Status On-going   OT LONG TERM GOAL #3   Title Patient will demonstrate the ability to perform vacuuming of one room with modified independence using light weight vacuum.   Baseline unable to perform currently.   Time 6   Period Months   Status On-going   OT LONG TERM GOAL #4   Title Patient will demonstrate the ability to hang clothes onto hangers to place in the closet with modified independence.   Baseline requires help from mom at this time, moderate assist with pants   Time 6   Period Months   Status On-going   OT LONG TERM GOAL #5   Title Patient will improve grip strength  to be able to achieve functional goals listed   Time 12   Period Months   Status Achieved   OT LONG TERM GOAL #6   Title Will now be able to go through a door that swings out using motorized wheel chair.   Baseline difficulty depending on type of door and set up of the area   Time 6   Period Months   Status On-going   OT LONG TERM GOAL #7   Title (Higher level grip) Will be able to squeeze a lemon during cooking.   Baseline cannot fully squeeze lemon to get juice out, exploring the use of a lemon press to assist   Time 6   Period Months   Status Partially Met   OT LONG TERM GOAL #8   Title Patient will be able to donn night leg brace independently before she goes to college.   Time 6   Period Months   Status Achieved   OT LONG TERM GOAL  #9   Baseline Patient will be able to braid her own hair before college   Time 6   Period Months   Status On-going   OT LONG TERM GOAL   #10   TITLE Wll be able to wash her own hair before starting college   Baseline mom still has to help with more than 50% of task.  Unable to hold arms up long enough to complete and get shampoo out of long hair.   Time 6   Period Months   Status On-going   OT LONG TERM GOAL  #11   TITLE Will be able to squeeze tooth paste out of the tube when it is less than half full.   Baseline still requires assist if tube is less than 1/2 full.   Time 6   Period Months   Status Partially Met   OT LONG TERM GOAL  #12   TITLE Patient will be able to donn/doff crutch tips.   Baseline crutch tips are difficult for anyone to take them off, goal deferred.   Time 6   Period Months   Status Not Met   OT LONG TERM GOAL  #13   TITLE Patient will be able to complete 2 1/2 minutes straight with left shoulder flexion simulating driving with a stearing wheel ball to be able to drive to college 1 hr 40 min away   Baseline can do 30 second of shoulder flexion with 2 1/2 before fatigue and can only drive 15 min before right shoulder fatigues.   Time 6   Period Months   Status On-going   OT LONG TERM GOAL  #14   TITLE Will be able to open taped boxes (for moving to college) using a key or knife.   Baseline ongoing techniques explored with adaptive devices to assist.   Time 6   Period Months   Status On-going   OT LONG TERM GOAL  #15   TITLE  cut layers of paper which she will have to do for craft classes in college.   Time 6   Period Months   Status Achieved               Plan - 03/10/16 1438    Clinical Impression Statement Pt. worked on Psychologist, forensic with bilateral cuff weights in place. Pt. was able to braid both with vision and with eyes occluded. pt. worked on [facility] skills with grasping with thumb and 2nd digit. Pt. worked on storing  objects, and coins in her hand.   Rehab Potential Good   OT Frequency 1x / week   OT Duration 12 weeks   OT Treatment/Interventions Self-care/ADL  training;Therapeutic exercise;Therapeutic exercises;Therapeutic activities   Consulted and Agree with Plan of Care Patient      Patient will benefit from skilled therapeutic intervention in order to improve the following deficits and impairments:  Impaired UE functional use, Difficulty walking, Decreased strength, Decreased coordination, Decreased endurance, Decreased balance  Visit Diagnosis: Other lack of coordination  Muscle weakness (generalized)    Problem List There are no active problems to display for this patient.  Harrel Carina, MS, OTR/L  Harrel Carina 03/10/2016, 2:54 PM  [place] MAIN [facility] SERVICES [address] [place], [place], [address] Phone: [phone]   Fax:  [phone]  Name: GIOVANA FACIANE MRN: [id number] Date of Birth: 14-Oct-1996

## 2016-03-10 NOTE — Patient Instructions (Signed)
OT TREATMENT    Neuro muscular re-education: Pt. Worked on grasping small 1/2" flat coins, and storing them in the palm of her hand. Pt. Worked on moving the coins from her palm to the tip of her thumb and 2nd digit to place it in the counter container. Pt. worked on using tweezers to grasp and move 1/8th inch pegs.   Selfcare: pt. worked on Sports administratorbraiding at a vertical angle with 2# cuff weights in place. Pt. Worked on braiding with vision occluded to simulate being able to braid in the back of her hair.

## 2016-03-10 NOTE — Therapy (Signed)
[place] [facility] MAIN [facility] SERVICES [address] [place], [place], [address] Phone: [phone]   Fax:  [phone]  Physical Therapy Treatment  Patient Details  Name: TAHEERAH GULDIN MRN: [id number] Date of Birth: November 17, 1995 No Data Recorded  Encounter Date: 03/10/2016      PT End of Session - 03/10/16 1456    Visit Number 34   Number of Visits 44   Date for PT Re-Evaluation 05/19/16   Authorization Type medicaid   PT Start Time 0235   PT Stop Time 0315   PT Time Calculation (min) 40 min   Activity Tolerance Patient tolerated treatment well   Behavior During Therapy [facility] for tasks assessed/performed      History reviewed. No pertinent past medical history.  History reviewed. No pertinent past surgical history.  There were no vitals filed for this visit.      Subjective Assessment - 03/10/16 1430    Subjective pt reports she would like to get stronger and do curbs better   Patient is accompained by: Family member   Limitations Walking   Patient Stated Goals Patient wants to improve her core strength.                                  PT Education - 03/10/16 1454    Education provided Yes   Education Details HEP   Person(s) Educated Patient   Methods Explanation   Comprehension Verbalized understanding             PT Long Term Goals - 02/25/16 1540    PT LONG TERM GOAL #1   Title Patient will improve Dynamic Gait Index (DGI) score to > 19/24 for low falls risk regarding dynamic walking tasks    Time 12   Period Weeks   Status On-going   PT LONG TERM GOAL #2   Title Patient will increase Berg Balance score by > 6 points to demonstrate decreased fall risk during functional activities    Time 12   Period Weeks   Status On-going   PT LONG TERM GOAL #3   Title Patient will be able to transfer in and out of a large car with Patient will be able to transfer in and out of a high seated car with CGA     Time 12   Period Weeks   Status On-going   PT LONG TERM GOAL #4   Title Patient will complete a TUG test in < 12 seconds for independent mobility and decreased fall risk    Time 12   Period Weeks   Status On-going   PT LONG TERM GOAL #5   Time 12   Status On-going   PT LONG TERM GOAL #6   Time 12   Status On-going   PT LONG TERM GOAL #8   Time 12   Period Weeks   Status On-going   PT LONG TERM GOAL  #9   Time 12   Period Weeks   Status On-going   PT LONG TERM GOAL  #10   Time 12   Period Weeks   Status On-going    standing hip abd with YTB x 20  side stepping left and right in parallel bars 10 feet x 3 standing on blue foam with cone reaching x 20 across midline sit to stand x 10 marching in parallel bars x 10  TM walking 1.2 x 10  minutes. Patient needs occasional verbal cueing to improve posture and cueing to correctly perform exercises slowly, holding at end of range to increase motor firing of desired muscle to encourage fatigue.           Patient will benefit from skilled therapeutic intervention in order to improve the following deficits and impairments:     Visit Diagnosis: Muscle weakness (generalized)  Difficulty in walking, not elsewhere classified     Problem List There are no active problems to display for this patient.  Ezekiel InaKristine S Jakobie Henslee, PT, DPT [place] 03/10/2016, 4:04 PM  [place] [facility] [address] [place], [place], [address] Phone: [phone]   Fax:  [phone]  Name: Julaine HuaMisbah N Elpers MRN: [id number] Date of Birth: 07/09/1996

## 2016-03-15 ENCOUNTER — Ambulatory Visit: Payer: Medicaid Other | Admitting: Physical Therapy

## 2016-03-17 ENCOUNTER — Encounter: Payer: Self-pay | Admitting: Occupational Therapy

## 2016-03-17 ENCOUNTER — Ambulatory Visit: Payer: Medicaid Other | Attending: Pediatrics | Admitting: Occupational Therapy

## 2016-03-17 DIAGNOSIS — R262 Difficulty in walking, not elsewhere classified: Secondary | ICD-10-CM | POA: Diagnosis present

## 2016-03-17 DIAGNOSIS — M6281 Muscle weakness (generalized): Secondary | ICD-10-CM | POA: Diagnosis not present

## 2016-03-17 DIAGNOSIS — R278 Other lack of coordination: Secondary | ICD-10-CM | POA: Insufficient documentation

## 2016-03-18 ENCOUNTER — Encounter: Payer: Self-pay | Admitting: Physical Therapy

## 2016-03-18 ENCOUNTER — Ambulatory Visit: Payer: Medicaid Other | Admitting: Physical Therapy

## 2016-03-18 DIAGNOSIS — M6281 Muscle weakness (generalized): Secondary | ICD-10-CM

## 2016-03-18 DIAGNOSIS — R262 Difficulty in walking, not elsewhere classified: Secondary | ICD-10-CM

## 2016-03-18 NOTE — Therapy (Signed)
[place] [facility]IN [facility] [address] [place], [place], [address] Phone: [phone]   Fax:  [phone]  Physical Therapy Treatment  Patient Details  Name: Michelle Randall MRN: [id number] Date of Birth: 1995-11-28 No Data Recorded  Encounter Date: 03/18/2016      PT End of Session - 03/18/16 0853    Visit Number 35   Number of Visits 44   Date for PT Re-Evaluation 05/19/16   Authorization Type medicaid   PT Start Time 0830   PT Stop Time 0910   PT Time Calculation (min) 40 min   Activity Tolerance Patient tolerated treatment well   Behavior During Therapy [facility] for tasks assessed/performed      History reviewed. No pertinent past medical history.  History reviewed. No pertinent past surgical history.  There were no vitals filed for this visit.      Subjective Assessment - 03/18/16 0852    Subjective pt reports she would like to get stronger and do curbs better   Patient is accompained by: Family member   Limitations Walking   Patient Stated Goals Patient wants to improve her core strength.       TM walking 1.2  Miles per hour x 10 mintues with one hand hold Leg press 100 lbs x 20 x 3 Standing step ups x 20 , eccentric step downs x 20 x 2 BLE Ascending and descending steps x 4 x 3 with railings and 1 loft strand crutch Ascending curb and descending x 5 Patient needs occasional verbal cueing to improve posture and cueing to correctly perform exercises slowly, holding at end of range to increase motor firing of desired muscle to encourage fatigue.                            PT Education - 03/18/16 [phone]    Education provided Yes   Education Details HEP   Person(s) Educated Patient   Methods Explanation   Comprehension Verbalized understanding             PT Long Term Goals - 02/25/16 1540    PT LONG TERM GOAL #1   Title Patient will improve Dynamic Gait Index (DGI) score to > 19/24 for  low falls risk regarding dynamic walking tasks    Time 12   Period Weeks   Status On-going   PT LONG TERM GOAL #2   Title Patient will increase Berg Balance score by > 6 points to demonstrate decreased fall risk during functional activities    Time 12   Period Weeks   Status On-going   PT LONG TERM GOAL #3   Title Patient will be able to transfer in and out of a large car with Patient will be able to transfer in and out of a high seated car with CGA    Time 12   Period Weeks   Status On-going   PT LONG TERM GOAL #4   Title Patient will complete a TUG test in < 12 seconds for independent mobility and decreased fall risk    Time 12   Period Weeks   Status On-going   PT LONG TERM GOAL #5   Time 12   Status On-going   PT LONG TERM GOAL #6   Time 12   Status On-going   PT LONG TERM GOAL #8   Time 12   Period Weeks   Status On-going   PT LONG  TERM GOAL  #9   Time 12   Period Weeks   Status On-going   PT LONG TERM GOAL  #10   Time 12   Period Weeks   Status On-going               Plan - 03/18/16 0854    Clinical Impression Statement moderate verbal instruction to improve set up, proper use of LE, and improved posture and gait mechanics. Patient responded well  to instruction   Rehab Potential Good   Clinical Impairments Affecting Rehab Potential weakness and decreased standing balance   PT Frequency 1x / week   PT Duration 12 weeks   PT Treatment/Interventions Therapeutic exercise;Therapeutic activities;Gait training;Balance training;Stair training;DME Instruction;Neuromuscular re-education;Patient/family education   PT Next Visit Plan Continue with core and LE strengthening and TM ambulation   PT Home Exercise Plan Continued from previous sessions.    Consulted and Agree with Plan of Care Patient      Patient will benefit from skilled therapeutic intervention in order to improve the following deficits and impairments:  Abnormal gait, Decreased balance, Decreased  endurance, Difficulty walking, Decreased strength  Visit Diagnosis: Muscle weakness (generalized)  Difficulty in walking, not elsewhere classified     Problem List There are no active problems to display for this patient. [name], PT, DPT  [place], Barkley Bruns S 03/18/2016, 9:55 AM  [place] [facility] MAIN [facility] SERVICES [address] [place], [place], [address] Phone: [phone]   Fax:  [phone]  Name: Michelle Randall MRN: [id number] Date of Birth: [birth year]

## 2016-03-19 ENCOUNTER — Encounter: Payer: Self-pay | Admitting: Occupational Therapy

## 2016-03-19 NOTE — Therapy (Signed)
[place] MAIN [facility] SERVICES [address] [place], [place], [address] Phone: [phone]   Fax:  [phone]  Occupational Therapy Treatment  Patient Details  Name: Michelle Randall MRN: [id number] Date of Birth: Jan 22, 1996 No Data Recorded  Encounter Date: 03/17/2016      OT End of Session - 03/19/16 1135    Visit Number 16   Number of Visits 24   Date for OT Re-Evaluation 04/13/16   Authorization Type medicaid   Activity Tolerance Patient tolerated treatment well   Behavior During Therapy [facility] for tasks assessed/performed      History reviewed. No pertinent past medical history.  History reviewed. No pertinent past surgical history.  There were no vitals filed for this visit.      Subjective Assessment - 03/19/16 1131    Subjective  Patient reports she had one of her AP exams yesterday and 2 more next week.     Patient is accompained by: Family member   Patient Stated Goals wants to be independent with all tasks so she will be able to go away to college.                      OT Treatments/Exercises (OP) - 03/19/16 1131    ADLs   ADL Comments Patient seen for manipulation of coins for money management with right hand and to place into resistive bank, cues for technique at times and isolated finger movements.    Fine Motor Coordination   Other Fine Motor Exercises Patient seen this date for focus on fine motor coordination skills with manipulation.  [place] discs for turning and flipping with cues and use of one hand only, reaching and placing onto elevated surface.     Neurological Re-education Exercises   Other Exercises 1 Patient seen for right grip strength for sustained grip, on 4th setting of 23.4# for 25 reps each, left hand 17# for 25 reps.  Resistive green theraputty for grip, lateral pinch, 3 point pinch and 2 point pinch skills to improve pinch for picking up and opening packages.  Balloon volley with use  of bilateral UEs for ROM and strengthening of UEs.                  OT Education - 03/19/16 1134    Education provided Yes   Education Details strength and coordination skills for home    Person(s) Educated Patient   Methods Explanation;Demonstration;Verbal cues   Comprehension Verbal cues required;Returned demonstration;Verbalized understanding             OT Long Term Goals - 02/26/16 1105    OT LONG TERM GOAL #1   Title Patient will demonstrate methods of picking up her bookbag and placing it onto the back of her chair with modified independence to transport items to class.   Baseline requires assistance from others to complete now but will need to be independent when living alone at college.   Time 6   Period Months   Status On-going   OT LONG TERM GOAL #2   Title Patient will be able to make her bed with modified independence.   Time 6   Period Months   Status On-going   OT LONG TERM GOAL #3   Title Patient will demonstrate the ability to perform vacuuming of one room with modified independence using light weight vacuum.   Baseline unable to perform currently.   Time 6   Period Months  Status On-going   OT LONG TERM GOAL #4   Title Patient will demonstrate the ability to hang clothes onto hangers to place in the closet with modified independence.   Baseline requires help from mom at this time, moderate assist with pants   Time 6   Period Months   Status On-going   OT LONG TERM GOAL #5   Title Patient will improve grip strength to be able to achieve functional goals listed   Time 12   Period Months   Status Achieved   OT LONG TERM GOAL #6   Title Will now be able to go through a door that swings out using motorized wheel chair.   Baseline difficulty depending on type of door and set up of the area   Time 6   Period Months   Status On-going   OT LONG TERM GOAL #7   Title (Higher level grip) Will be able to squeeze a lemon during cooking.   Baseline  cannot fully squeeze lemon to get juice out, exploring the use of a lemon press to assist   Time 6   Period Months   Status Partially Met   OT LONG TERM GOAL #8   Title Patient will be able to donn night leg brace independently before she goes to college.   Time 6   Period Months   Status Achieved   OT LONG TERM GOAL  #9   Baseline Patient will be able to braid her own hair before college   Time 6   Period Months   Status On-going   OT LONG TERM GOAL  #10   TITLE Wll be able to wash her own hair before starting college   Baseline mom still has to help with more than 50% of task.  Unable to hold arms up long enough to complete and get shampoo out of long hair.   Time 6   Period Months   Status On-going   OT LONG TERM GOAL  #11   TITLE Will be able to squeeze tooth paste out of the tube when it is less than half full.   Baseline still requires assist if tube is less than 1/2 full.   Time 6   Period Months   Status Partially Met   OT LONG TERM GOAL  #12   TITLE Patient will be able to donn/doff crutch tips.   Baseline crutch tips are difficult for anyone to take them off, goal deferred.   Time 6   Period Months   Status Not Met   OT LONG TERM GOAL  #13   TITLE Patient will be able to complete 2 1/2 minutes straight with left shoulder flexion simulating driving with a stearing wheel ball to be able to drive to college 1 hr 40 min away   Baseline can do 30 second of shoulder flexion with 2 1/2 before fatigue and can only drive 15 min before right shoulder fatigues.   Time 6   Period Months   Status On-going   OT LONG TERM GOAL  #14   TITLE Will be able to open taped boxes (for moving to college) using a key or knife.   Baseline ongoing techniques explored with adaptive devices to assist.   Time 6   Period Months   Status On-going   OT LONG TERM GOAL  #15   TITLE  cut layers of paper which she will have to do for craft classes in college.   Time 6  Period Months   Status  Achieved               Plan - 03/19/16 1135    Clinical Impression Statement Patient continues to improve with strength and coordination skills required to complete daily tasks, increased grip strength on right to perform sustain grip tasks with 23.4#.  Rest breaks as needed for overhead reaching activities. Continue to work towards goals to increase independence in tasks to transition to college.    Rehab Potential Good   OT Frequency 1x / week   OT Duration 12 weeks   OT Treatment/Interventions Self-care/ADL training;Therapeutic exercise;Therapeutic exercises;Therapeutic activities      Patient will benefit from skilled therapeutic intervention in order to improve the following deficits and impairments:  Impaired UE functional use, Difficulty walking, Decreased strength, Decreased coordination, Decreased endurance, Decreased balance  Visit Diagnosis: Muscle weakness (generalized)  Other lack of coordination    Problem List There are no active problems to display for this patient.  Achilles Dunk, OTR/L, CLT  Lovett,Amy 03/19/2016, 11:37 AM  [place] MAIN [facility] SERVICES [place], [place], [address] Phone: [phone]   Fax:  [phone]  Name: Michelle Randall MRN: [id number] Date of Birth: 1996/02/10

## 2016-03-24 ENCOUNTER — Encounter: Payer: Self-pay | Admitting: Physical Therapy

## 2016-03-24 ENCOUNTER — Ambulatory Visit: Payer: Medicaid Other | Admitting: Physical Therapy

## 2016-03-24 DIAGNOSIS — R262 Difficulty in walking, not elsewhere classified: Secondary | ICD-10-CM

## 2016-03-24 DIAGNOSIS — M6281 Muscle weakness (generalized): Secondary | ICD-10-CM

## 2016-03-24 NOTE — Therapy (Signed)
[place] [facility] [address] [place], [place], [address] Phone: [phone]   Fax:  [phone]  Physical Therapy Treatment  Patient Details  Name: Michelle Randall MRN: [id number] Date of Birth: May 09, 1996 No Data Recorded  Encounter Date: 03/24/2016      PT End of Session - 03/24/16 0840    Visit Number 36   Number of Visits 44   Date for PT Re-Evaluation 05/19/16   Authorization Type medicaid   PT Start Time 0830   PT Stop Time 0910   PT Time Calculation (min) 40 min   Activity Tolerance Patient tolerated treatment well   Behavior During Therapy [facility] for tasks assessed/performed      History reviewed. No pertinent past medical history.  History reviewed. No pertinent past surgical history.  There were no vitals filed for this visit.      Subjective Assessment - 03/24/16 0838    Subjective pt reports she would like to get stronger and do curbs better   Patient is accompained by: Family member   Limitations Walking   Patient Stated Goals Patient wants to improve her core strength.         Gait training : Ascending / descending steps with loft strand crutches; ascending curb with loft strand crutch, TM walking incline 1 speed 1.2 miles / hour  Theraptuic exercise:  Step ups left and right 6 inch stool Stepping over 1/2 foam left and right Step downs eccentric x 10 x 2 BLE  Patient needs occasional verbal cueing to improve posture and cueing to correctly perform exercises slowly, holding at end of range to increase motor firing of desired muscle to encourage fatigue.                            PT Education - 03/24/16 [phone]    Education provided Yes   Education Details HEP    Person(s) Educated Patient   Methods Explanation   Comprehension Verbalized understanding             PT Long Term Goals - 02/25/16 1540    PT LONG TERM GOAL #1   Title Patient will improve Dynamic Gait  Index (DGI) score to > 19/24 for low falls risk regarding dynamic walking tasks    Time 12   Period Weeks   Status On-going   PT LONG TERM GOAL #2   Title Patient will increase Berg Balance score by > 6 points to demonstrate decreased fall risk during functional activities    Time 12   Period Weeks   Status On-going   PT LONG TERM GOAL #3   Title Patient will be able to transfer in and out of a large car with Patient will be able to transfer in and out of a high seated car with CGA    Time 12   Period Weeks   Status On-going   PT LONG TERM GOAL #4   Title Patient will complete a TUG test in < 12 seconds for independent mobility and decreased fall risk    Time 12   Period Weeks   Status On-going   PT LONG TERM GOAL #5   Time 12   Status On-going   PT LONG TERM GOAL #6   Time 12   Status On-going   PT LONG TERM GOAL #8   Time 12   Period Weeks   Status On-going   PT LONG  TERM GOAL  #9   Time 12   Period Weeks   Status On-going   PT LONG TERM GOAL  #10   Time 12   Period Weeks   Status On-going               Plan - 03/24/16 0840    Clinical Impression Statement Fatigue with sit to stand but demonstrating more control, Increase weight for standing exercises. Fatigue still evident with cross trainer and endurance.    Rehab Potential Good   Clinical Impairments Affecting Rehab Potential weakness and decreased standing balance   PT Frequency 1x / week   PT Duration 12 weeks   PT Treatment/Interventions Therapeutic exercise;Therapeutic activities;Gait training;Balance training;Stair training;DME Instruction;Neuromuscular re-education;Patient/family education   PT Next Visit Plan Continue with core and LE strengthening and TM ambulation   PT Home Exercise Plan Continued from previous sessions.    Consulted and Agree with Plan of Care Patient      Patient will benefit from skilled therapeutic intervention in order to improve the following deficits and impairments:   Abnormal gait, Decreased balance, Decreased endurance, Difficulty walking, Decreased strength  Visit Diagnosis: Muscle weakness (generalized)  Difficulty in walking, not elsewhere classified     Problem List There are no active problems to display for this patient. [name], PT, DPT  [place], Barkley Bruns S 03/24/2016, 8:42 AM  [place] [facility] MAIN [facility] SERVICES [address] [place], [place], [address] Phone: [phone]   Fax:  [phone]  Name: Michelle Randall MRN: [id number] Date of Birth: [birth year]

## 2016-03-26 ENCOUNTER — Ambulatory Visit: Payer: Medicaid Other | Admitting: Occupational Therapy

## 2016-03-26 DIAGNOSIS — M6281 Muscle weakness (generalized): Secondary | ICD-10-CM

## 2016-03-26 DIAGNOSIS — R278 Other lack of coordination: Secondary | ICD-10-CM

## 2016-03-26 NOTE — Therapy (Signed)
[place] MAIN [facility] SERVICES [address] [place], [place], [address] Phone: [phone]   Fax:  [phone]  Occupational Therapy Treatment  Patient Details  Name: Michelle Randall MRN: [id number] Date of Birth: 12-Jul-1996 No Data Recorded  Encounter Date: 03/26/2016      OT End of Session - 03/26/16 1053    Visit Number 17   Number of Visits 24   Date for OT Re-Evaluation 04/13/16   Authorization Type medicaid   OT Start Time 0830   OT Stop Time 0920   OT Time Calculation (min) 50 min   Activity Tolerance Patient tolerated treatment well   Behavior During Therapy [facility] for tasks assessed/performed      No past medical history on file.  No past surgical history on file.  There were no vitals filed for this visit.      Subjective Assessment - 03/26/16 0939    Subjective  Patient reports she felt she did well on most of her AP exams, calculus was the most challenging and she is glad they are over.  Hopes she passed them all so she will get college credit and not have to take again.    Patient Stated Goals wants to be independent with all tasks so she will be able to go away to college.   Currently in Pain? No/denies   Multiple Pain Sites No                      OT Treatments/Exercises (OP) - 03/26/16 0001    ADLs   ADL Comments Patient seen this date for problem solving for issues related to upcoming college admission as follows:  Patient has difficulty with picking up her book bag from the floor especially if it is loaded.  She often has to remove one item to get to the items she needs.  She would benefit from light weight computer/tablet for note taking since she does not have the endurance in her hands for extensive handwriting.  She may benefit from digital version of books so she will not have to carry books in her bag.  She may also need 2 book bags to split the items she will need for an entire day.  Instructed  on wheelchair poncho which may help when attempting to go from class to class in inclement weather.    Neurological Re-education Exercises   Other Exercises 1 Strengthening tasks with right and left hands for grip strength to be able to pick up and carry items.  11# for 25 reps, 17# for 25 reps for each hand.  Dowel ladder with 2.5# added for shoulder flexion  and forwards reach. Cues provided for form and technique.                 OT Education - 03/26/16 0940    Education provided Yes   Education Details instructed on potential accomodations for attending college in the fall.    Person(s) Educated Patient;Parent(s)   Methods Explanation;Demonstration;Verbal cues   Comprehension Verbal cues required;Returned demonstration;Verbalized understanding             OT Long Term Goals - 02/26/16 1105    OT LONG TERM GOAL #1   Title Patient will demonstrate methods of picking up her bookbag and placing it onto the back of her chair with modified independence to transport items to class.   Baseline requires assistance from others to complete now but will need to be  independent when living alone at college.   Time 6   Period Months   Status On-going   OT LONG TERM GOAL #2   Title Patient will be able to make her bed with modified independence.   Time 6   Period Months   Status On-going   OT LONG TERM GOAL #3   Title Patient will demonstrate the ability to perform vacuuming of one room with modified independence using light weight vacuum.   Baseline unable to perform currently.   Time 6   Period Months   Status On-going   OT LONG TERM GOAL #4   Title Patient will demonstrate the ability to hang clothes onto hangers to place in the closet with modified independence.   Baseline requires help from mom at this time, moderate assist with pants   Time 6   Period Months   Status On-going   OT LONG TERM GOAL #5   Title Patient will improve grip strength to be able to achieve  functional goals listed   Time 12   Period Months   Status Achieved   OT LONG TERM GOAL #6   Title Will now be able to go through a door that swings out using motorized wheel chair.   Baseline difficulty depending on type of door and set up of the area   Time 6   Period Months   Status On-going   OT LONG TERM GOAL #7   Title (Higher level grip) Will be able to squeeze a lemon during cooking.   Baseline cannot fully squeeze lemon to get juice out, exploring the use of a lemon press to assist   Time 6   Period Months   Status Partially Met   OT LONG TERM GOAL #8   Title Patient will be able to donn night leg brace independently before she goes to college.   Time 6   Period Months   Status Achieved   OT LONG TERM GOAL  #9   Baseline Patient will be able to braid her own hair before college   Time 6   Period Months   Status On-going   OT LONG TERM GOAL  #10   TITLE Wll be able to wash her own hair before starting college   Baseline mom still has to help with more than 50% of task.  Unable to hold arms up long enough to complete and get shampoo out of long hair.   Time 6   Period Months   Status On-going   OT LONG TERM GOAL  #11   TITLE Will be able to squeeze tooth paste out of the tube when it is less than half full.   Baseline still requires assist if tube is less than 1/2 full.   Time 6   Period Months   Status Partially Met   OT LONG TERM GOAL  #12   TITLE Patient will be able to donn/doff crutch tips.   Baseline crutch tips are difficult for anyone to take them off, goal deferred.   Time 6   Period Months   Status Not Met   OT LONG TERM GOAL  #13   TITLE Patient will be able to complete 2 1/2 minutes straight with left shoulder flexion simulating driving with a stearing wheel ball to be able to drive to college 1 hr 40 min away   Baseline can do 30 second of shoulder flexion with 2 1/2 before fatigue and can only drive 15 min before  right shoulder fatigues.   Time 6    Period Months   Status On-going   OT LONG TERM GOAL  #14   TITLE Will be able to open taped boxes (for moving to college) using a key or knife.   Baseline ongoing techniques explored with adaptive devices to assist.   Time 6   Period Months   Status On-going   OT LONG TERM GOAL  #15   TITLE  cut layers of paper which she will have to do for craft classes in college.   Time 6   Period Months   Status Achieved               Plan - 03/26/16 1053    Clinical Impression Statement Patient will be graduating from high school soon and will be transitioning to college and will be faced with additional challenges in a new envrironment.  She will likely need modifications and accomodations to be successful in the transition.  Will work with the patient and family to help identify these needs and make appropriate recommendations.  Will also work towards increasing strength and coordination to improve her ability to perform these tasks.    Rehab Potential Good   OT Frequency 1x / week   OT Duration 12 weeks   OT Treatment/Interventions Self-care/ADL training;Therapeutic exercise;Therapeutic exercises;Therapeutic activities   Consulted and Agree with Plan of Care Patient      Patient will benefit from skilled therapeutic intervention in order to improve the following deficits and impairments:  Impaired UE functional use, Difficulty walking, Decreased strength, Decreased coordination, Decreased endurance, Decreased balance  Visit Diagnosis: Muscle weakness (generalized)  Other lack of coordination    Problem List There are no active problems to display for this patient.  Achilles Dunk, OTR/L, CLT  Finneas Mathe 03/26/2016, 10:57 AM  [place] MAIN [facility] SERVICES [place], [place], [address] Phone: [phone]   Fax:  [phone]  Name: Michelle Randall MRN: [id number] Date of Birth: August 08, 1996

## 2016-03-31 ENCOUNTER — Ambulatory Visit: Payer: Medicaid Other | Admitting: Physical Therapy

## 2016-03-31 ENCOUNTER — Encounter: Payer: Self-pay | Admitting: Physical Therapy

## 2016-03-31 DIAGNOSIS — M6281 Muscle weakness (generalized): Secondary | ICD-10-CM | POA: Diagnosis not present

## 2016-03-31 DIAGNOSIS — R262 Difficulty in walking, not elsewhere classified: Secondary | ICD-10-CM

## 2016-03-31 NOTE — Therapy (Signed)
[place] [facility] MAIN [facility] [address] [place], [place], [address] Phone: [phone]   Fax:  [phone]  Physical Therapy Treatment  Patient Details  Name: Michelle Randall MRN: [id number] Date of Birth: [birth year] No Data Recorded  Encounter Date: 03/31/2016      PT End of Session - 03/31/16 1359    Visit Number 37   Number of Visits 44   Date for PT Re-Evaluation 05/19/16   Authorization Type medicaid   PT Start Time 0150   PT Stop Time 0230   PT Time Calculation (min) 40 min   Activity Tolerance Patient tolerated treatment well   Behavior [facility] for tasks assessed/performed      History reviewed. No pertinent past medical history.  History reviewed. No pertinent past surgical history.  There were no vitals filed for this visit.      Subjective Assessment - 03/31/16 1358    Subjective pt reports she would like to get stronger and do curbs better   Patient is accompained by: Family member   Limitations Walking   Patient Stated Goals Patient wants to improve her core strength.       Ascending/sescending steps x 4 x 3 sets Curbs x 3 Quadriped UE and LE extension x 10 x 2 with assist for LE Quadriped to tall kneeling x 10 No reports of pain and cuing for safety and sequencing                            PT Education - 03/31/16 1359    Education provided Yes   Education Details safety with ascending and descending steps   Person(s) Educated Patient   Methods Explanation   Comprehension Verbalized understanding             PT Long Term Goals - 02/25/16 1540    PT LONG TERM GOAL #1   Title Patient will improve Dynamic Gait Index (DGI) score to > 19/24 for low falls risk regarding dynamic walking tasks    Time 12   Period Weeks   Status On-going   PT LONG TERM GOAL #2   Title Patient will increase Berg Balance score by > 6 points to demonstrate decreased fall risk during  functional activities    Time 12   Period Weeks   Status On-going   PT LONG TERM GOAL #3   Title Patient will be able to transfer in and out of a large car with Patient will be able to transfer in and out of a high seated car with CGA    Time 12   Period Weeks   Status On-going   PT LONG TERM GOAL #4   Title Patient will complete a TUG test in < 12 seconds for independent mobility and decreased fall risk    Time 12   Period Weeks   Status On-going   PT LONG TERM GOAL #5   Time 12   Status On-going   PT LONG TERM GOAL #6   Time 12   Status On-going   PT LONG TERM GOAL #8   Time 12   Period Weeks   Status On-going   PT LONG TERM GOAL  #9   Time 12   Period Weeks   Status On-going   PT LONG TERM GOAL  #10   Time 12   Period Weeks   Status On-going  Plan - 03/31/16 1401    Clinical Impression Statement Motor control of LE  improved for steps.   Muscle fatigue but no major pain complaints   Rehab Potential Good   Clinical Impairments Affecting Rehab Potential weakness and decreased standing balance   PT Frequency 1x / week   PT Duration 12 weeks   PT Treatment/Interventions Therapeutic exercise;Therapeutic activities;Gait training;Balance training;Stair training;DME Instruction;Neuromuscular re-education;Patient/family education   PT Next Visit Plan Continue with core and LE strengthening and TM ambulation   PT Home Exercise Plan Continued from previous sessions.    Consulted and Agree with Plan of Care Patient      Patient will benefit from skilled therapeutic intervention in order to improve the following deficits and impairments:  Abnormal gait, Decreased balance, Decreased endurance, Difficulty walking, Decreased strength  Visit Diagnosis: Muscle weakness (generalized)  Difficulty in walking, not elsewhere classified     Problem List There are no active problems to display for this patient. [name], PT, DPT [name], PT, DPT [place], Barkley Bruns S 03/31/2016, 3:09 PM  [place] [facility] MAIN [facility] SERVICES [address] [place], [place], [address] Phone: [phone]   Fax:  [phone]  Name: Michelle Randall MRN: [id number] Date of Birth: April 27, 1996

## 2016-04-02 ENCOUNTER — Ambulatory Visit: Payer: Medicaid Other | Admitting: Occupational Therapy

## 2016-04-02 DIAGNOSIS — M6281 Muscle weakness (generalized): Secondary | ICD-10-CM | POA: Diagnosis not present

## 2016-04-02 DIAGNOSIS — R278 Other lack of coordination: Secondary | ICD-10-CM

## 2016-04-05 NOTE — Therapy (Signed)
[place] MAIN [facility] SERVICES [address] [place], [place], [address] Phone: [phone]   Fax:  [phone]  Occupational Therapy Treatment  Patient Details  Name: Michelle Randall MRN: [id number] Date of Birth: [birth year] No Data Recorded  Encounter Date: 04/02/2016      OT End of Session - 04/05/16 1549    Visit Number 18   Number of Visits 24   Date for OT Re-Evaluation 04/13/16   Authorization Type medicaid   OT Start Time 0831   OT Stop Time 0917   OT Time Calculation (min) 46 min   Activity Tolerance Patient tolerated treatment well   Behavior During Therapy [facility] for tasks assessed/performed      No past medical history on file.  No past surgical history on file.  There were no vitals filed for this visit.      Subjective Assessment - 04/05/16 1547    Subjective  Patient reports seniors are ready to graduate, not much longer for school, about 20 or so days left.  She has a few more projects left to complete.    Patient Stated Goals wants to be independent with all tasks so she will be able to go away to college.                      OT Treatments/Exercises (OP) - 04/05/16 1547    Fine Motor Coordination   Other Fine Motor Exercises Manipulation of 1/2 inch sized objects with right hand for coordination with focus on prehension patterns, oppositional movements, using hand for storage and translatory skills of the hand.  Cues occasionally for technique and isolated finger movements.    Neurological Re-education Exercises   Other Exercises 1 Patient seen this date for 1# wrist weight to right UE to objects from level to level with shape tower for 3 levels, weight removed to complete last level with reach of 31 inches above tabletop. Left hand no weight moving shapes from tallest level to smallest 4 complete levels, unable to take off shapes from tallest level, lacking about 1.5 inches in reach to remove. Resistive  pinch pins all levels placed onto elevated surfaces to challenge reach with right hand then left for bilateral UE strengthening.                 OT Education - 04/05/16 1548    Education provided Yes   Education Details HEP, self care, hair braiding   Person(s) Educated Patient   Methods Explanation;Demonstration;Verbal cues   Comprehension Verbal cues required;Returned demonstration;Verbalized understanding             OT Long Term Goals - 02/26/16 1105    OT LONG TERM GOAL #1   Title Patient will demonstrate methods of picking up her bookbag and placing it onto the back of her chair with modified independence to transport items to class.   Baseline requires assistance from others to complete now but will need to be independent when living alone at college.   Time 6   Period Months   Status On-going   OT LONG TERM GOAL #2   Title Patient will be able to make her bed with modified independence.   Time 6   Period Months   Status On-going   OT LONG TERM GOAL #3   Title Patient will demonstrate the ability to perform vacuuming of one room with modified independence using light weight vacuum.   Baseline unable to perform currently.  Time 6   Period Months   Status On-going   OT LONG TERM GOAL #4   Title Patient will demonstrate the ability to hang clothes onto hangers to place in the closet with modified independence.   Baseline requires help from mom at this time, moderate assist with pants   Time 6   Period Months   Status On-going   OT LONG TERM GOAL #5   Title Patient will improve grip strength to be able to achieve functional goals listed   Time 12   Period Months   Status Achieved   OT LONG TERM GOAL #6   Title Will now be able to go through a door that swings out using motorized wheel chair.   Baseline difficulty depending on type of door and set up of the area   Time 6   Period Months   Status On-going   OT LONG TERM GOAL #7   Title (Higher level  grip) Will be able to squeeze a lemon during cooking.   Baseline cannot fully squeeze lemon to get juice out, exploring the use of a lemon press to assist   Time 6   Period Months   Status Partially Met   OT LONG TERM GOAL #8   Title Patient will be able to donn night leg brace independently before she goes to college.   Time 6   Period Months   Status Achieved   OT LONG TERM GOAL  #9   Baseline Patient will be able to braid her own hair before college   Time 6   Period Months   Status On-going   OT LONG TERM GOAL  #10   TITLE Wll be able to wash her own hair before starting college   Baseline mom still has to help with more than 50% of task.  Unable to hold arms up long enough to complete and get shampoo out of long hair.   Time 6   Period Months   Status On-going   OT LONG TERM GOAL  #11   TITLE Will be able to squeeze tooth paste out of the tube when it is less than half full.   Baseline still requires assist if tube is less than 1/2 full.   Time 6   Period Months   Status Partially Met   OT LONG TERM GOAL  #12   TITLE Patient will be able to donn/doff crutch tips.   Baseline crutch tips are difficult for anyone to take them off, goal deferred.   Time 6   Period Months   Status Not Met   OT LONG TERM GOAL  #13   TITLE Patient will be able to complete 2 1/2 minutes straight with left shoulder flexion simulating driving with a stearing wheel ball to be able to drive to college 1 hr 40 min away   Baseline can do 30 second of shoulder flexion with 2 1/2 before fatigue and can only drive 15 min before right shoulder fatigues.   Time 6   Period Months   Status On-going   OT LONG TERM GOAL  #14   TITLE Will be able to open taped boxes (for moving to college) using a key or knife.   Baseline ongoing techniques explored with adaptive devices to assist.   Time 6   Period Months   Status On-going   OT LONG TERM GOAL  #15   TITLE  cut layers of paper which she will have to do for  craft classes in college.   Time 6   Period Months   Status Achieved               Plan - 04/05/16 1549    Clinical Impression Statement Patient continues to progress with self care tasks, strengthening and ROM, will be attending college in the fall and will need to be independent on campus.  She continues to work towards basic self care skills and skills required to perform and attend school.  She will require modifications at college in order to be successful.  Will work with local agencies and office of disability at the college to make recommendations.    Rehab Potential Good   OT Frequency 1x / week   OT Duration 12 weeks   OT Treatment/Interventions Self-care/ADL training;Therapeutic exercise;Therapeutic exercises;Therapeutic activities   Consulted and Agree with Plan of Care Patient   Family Member Consulted Mom      Patient will benefit from skilled therapeutic intervention in order to improve the following deficits and impairments:  Impaired UE functional use, Difficulty walking, Decreased strength, Decreased coordination, Decreased endurance, Decreased balance  Visit Diagnosis: Muscle weakness (generalized)  Other lack of coordination    Problem List There are no active problems to display for this patient.  Achilles Dunk, OTR/L, CLT  Lovett,Amy 04/05/2016, 3:53 PM  [place] MAIN [facility] SERVICES [address] [place], [place], [address] Phone: [phone]   Fax:  [phone]  Name: LAN ENTSMINGER MRN: [id number] Date of Birth: [birth year]

## 2016-04-06 ENCOUNTER — Ambulatory Visit: Payer: Medicaid Other | Admitting: Occupational Therapy

## 2016-04-06 ENCOUNTER — Ambulatory Visit: Payer: Medicaid Other | Admitting: Physical Therapy

## 2016-04-06 DIAGNOSIS — M6281 Muscle weakness (generalized): Secondary | ICD-10-CM | POA: Diagnosis not present

## 2016-04-06 NOTE — Patient Instructions (Addendum)
OT TREATMENT    Therapeutic Exercise:   Pt. performed 3# dowel ex. for UE strengthening secondary to weakness. Bilateral shoulder flexion, chest press, circular patterns, V patterns, and elbow flexion/extension were performed. Exercises were upgraded to 3.5#. 3# dumbbell ex. for elbow flexion and extension,  2# for forearm supination/pronation, wrist flexion/extension, and radial deviation. Pt. requires rest breaks and verbal cues for proper technique. Therapeutic ex. was performed to strengthen BUE's in preparation for functional lifting and reaching during ADLs, and IADLs. Pt. performed gross gripping with a grip strengthener. Pt. worked on sustaining grip while grasping pegs and reaching at various heights. Pt. performed it in sitting with 2# cuff weights in place.

## 2016-04-06 NOTE — Therapy (Signed)
[place] MAIN [facility] SERVICES [address] [place], [place], [address] Phone: [phone]   Fax:  [phone]  Occupational Therapy Treatment  Patient Details  Name: Michelle Randall MRN: [id number] Date of Birth: [birth year] No Data Recorded  Encounter Date: 04/06/2016      OT End of Session - 04/06/16 1716    Visit Number 19   Number of Visits 24   Date for OT Re-Evaluation 04/13/16   Authorization Type medicaid   OT Start Time 1620   OT Stop Time 1700   OT Time Calculation (min) 40 min   Activity Tolerance Patient tolerated treatment well   Behavior During Therapy [facility] for tasks assessed/performed      No past medical history on file.  No past surgical history on file.  There were no vitals filed for this visit.      Subjective Assessment - 04/06/16 1620    Subjective  Pt. reports she has to wait until July to find out if she gets college credits for the AP course tests she just took.   Patient is accompained by: Family member      OT TREATMENT    Therapeutic Exercise:   Pt. performed 3# dowel ex. for UE strengthening secondary to weakness. Bilateral shoulder flexion, chest press, circular patterns, V patterns, and elbow flexion/extension were performed. Exercises were upgraded to 3.5#. 3# dumbbell ex. for elbow flexion and extension,  2# for forearm supination/pronation, wrist flexion/extension, and radial deviation. Pt. requires rest breaks and verbal cues for proper technique. Therapeutic ex. was performed to strengthen BUE's in preparation for functional lifting and reaching during ADLs, and IADLs. Pt. performed gross gripping with a grip strengthener. Pt. worked on sustaining grip while grasping pegs and reaching at various heights. Pt. performed it in sitting with 2# cuff weights in place.                    OT Treatments/Exercises (OP) - 04/05/16 1547    Fine Motor Coordination   Other Fine Motor  Exercises Manipulation of 1/2 inch sized objects with right hand for coordination with focus on prehension patterns, oppositional movements, using hand for storage and translatory skills of the hand.  Cues occasionally for technique and isolated finger movements.    Neurological Re-education Exercises   Other Exercises 1 Patient seen this date for 1# wrist weight to right UE to objects from level to level with shape tower for 3 levels, weight removed to complete last level with reach of 31 inches above tabletop. Left hand no weight moving shapes from tallest level to smallest 4 complete levels, unable to take off shapes from tallest level, lacking about 1.5 inches in reach to remove. Resistive pinch pins all levels placed onto elevated surfaces to challenge reach with right hand then left for bilateral UE strengthening.                 OT Education - 04/06/16 1715    Education provided Yes   Person(s) Educated Patient   Methods Explanation;Demonstration;Verbal cues   Comprehension Verbalized understanding;Returned demonstration             OT Long Term Goals - 02/26/16 1105    OT LONG TERM GOAL #1   Title Patient will demonstrate methods of picking up her bookbag and placing it onto the back of her chair with modified independence to transport items to class.   Baseline requires assistance from others to complete now but  will need to be independent when living alone at college.   Time 6   Period Months   Status On-going   OT LONG TERM GOAL #2   Title Patient will be able to make her bed with modified independence.   Time 6   Period Months   Status On-going   OT LONG TERM GOAL #3   Title Patient will demonstrate the ability to perform vacuuming of one room with modified independence using light weight vacuum.   Baseline unable to perform currently.   Time 6   Period Months   Status On-going   OT LONG TERM GOAL #4   Title Patient will demonstrate the ability to hang  clothes onto hangers to place in the closet with modified independence.   Baseline requires help from mom at this time, moderate assist with pants   Time 6   Period Months   Status On-going   OT LONG TERM GOAL #5   Title Patient will improve grip strength to be able to achieve functional goals listed   Time 12   Period Months   Status Achieved   OT LONG TERM GOAL #6   Title Will now be able to go through a door that swings out using motorized wheel chair.   Baseline difficulty depending on type of door and set up of the area   Time 6   Period Months   Status On-going   OT LONG TERM GOAL #7   Title (Higher level grip) Will be able to squeeze a lemon during cooking.   Baseline cannot fully squeeze lemon to get juice out, exploring the use of a lemon press to assist   Time 6   Period Months   Status Partially Met   OT LONG TERM GOAL #8   Title Patient will be able to donn night leg brace independently before she goes to college.   Time 6   Period Months   Status Achieved   OT LONG TERM GOAL  #9   Baseline Patient will be able to braid her own hair before college   Time 6   Period Months   Status On-going   OT LONG TERM GOAL  #10   TITLE Wll be able to wash her own hair before starting college   Baseline mom still has to help with more than 50% of task.  Unable to hold arms up long enough to complete and get shampoo out of long hair.   Time 6   Period Months   Status On-going   OT LONG TERM GOAL  #11   TITLE Will be able to squeeze tooth paste out of the tube when it is less than half full.   Baseline still requires assist if tube is less than 1/2 full.   Time 6   Period Months   Status Partially Met   OT LONG TERM GOAL  #12   TITLE Patient will be able to donn/doff crutch tips.   Baseline crutch tips are difficult for anyone to take them off, goal deferred.   Time 6   Period Months   Status Not Met   OT LONG TERM GOAL  #13   TITLE Patient will be able to complete 2 1/2  minutes straight with left shoulder flexion simulating driving with a stearing wheel ball to be able to drive to college 1 hr 40 min away   Baseline can do 30 second of shoulder flexion with 2 1/2 before fatigue and can  only drive 15 min before right shoulder fatigues.   Time 6   Period Months   Status On-going   OT LONG TERM GOAL  #14   TITLE Will be able to open taped boxes (for moving to college) using a key or knife.   Baseline ongoing techniques explored with adaptive devices to assist.   Time 6   Period Months   Status On-going   OT LONG TERM GOAL  #15   TITLE  cut layers of paper which she will have to do for craft classes in college.   Time 6   Period Months   Status Achieved               Plan - 04/06/16 1716    Clinical Impression Statement Pt. continues to work on improving BUE strength in order to be able to reach and lift essential ADL/ IADL items for success in daily college related tasks.    Rehab Potential Good   OT Frequency 1x / week   OT Duration 12 weeks   OT Treatment/Interventions Self-care/ADL training;Therapeutic exercise;Therapeutic exercises;Therapeutic activities   OT Home Exercise Plan Putty and stress ball exercises.   Consulted and Agree with Plan of Care Patient   Family Member Consulted Dad      Patient will benefit from skilled therapeutic intervention in order to improve the following deficits and impairments:  Impaired UE functional use, Difficulty walking, Decreased strength, Decreased coordination, Decreased endurance, Decreased balance  Visit Diagnosis: Muscle weakness (generalized)    Problem List There are no active problems to display for this patient.  Harrel Carina, MS, OTR/L  Harrel Carina 04/06/2016, 5:26 PM  [place] MAIN [facility] SERVICES [address] [place], [place], [address] Phone: [phone]   Fax:  [phone]  Name: ZYAH GOMM MRN: [id number] Date of  Birth: [birth year]

## 2016-04-07 ENCOUNTER — Ambulatory Visit: Payer: Medicaid Other | Admitting: Physical Therapy

## 2016-04-07 ENCOUNTER — Encounter: Payer: Self-pay | Admitting: Physical Therapy

## 2016-04-07 DIAGNOSIS — R262 Difficulty in walking, not elsewhere classified: Secondary | ICD-10-CM

## 2016-04-07 DIAGNOSIS — M6281 Muscle weakness (generalized): Secondary | ICD-10-CM | POA: Diagnosis not present

## 2016-04-07 NOTE — Therapy (Signed)
[place] [facility] MAIN [facility] [address] [place], [place], [address] Phone: [phone]   Fax:  [phone]  Physical Therapy Treatment  Patient Details  Name: Michelle Randall MRN: [id number] Date of Birth: Nov 04, 1996 No Data Recorded  Encounter Date: 04/07/2016      PT End of Session - 04/07/16 1807    Visit Number 37   Number of Visits 44   Date for PT Re-Evaluation 05/19/16   Authorization Type medicaid   PT Start Time 0555   PT Stop Time 0635   PT Time Calculation (min) 40 min   Activity Tolerance Patient tolerated treatment well   Behavior During Therapy [facility] for tasks assessed/performed      History reviewed. No pertinent past medical history.  History reviewed. No pertinent past surgical history.  There were no vitals filed for this visit.      Subjective Assessment - 04/07/16 1805    Subjective pt reports she would like to get stronger and do curbs better   Patient is accompained by: Family member   Limitations Walking   Patient Stated Goals Patient wants to improve her core strength.        Therapeutic exercise:  1/2 kneeling and raising 1 UE alternating  Prone over theraball and BLE alternating extension/ alternating UE extension  Prone on elbows with knee flex assisted BLE  Prone hip extension x 10 BLE  Supine bridging x 10 x 2  SAQ over boster x 20 BLE  TM walking 1.2 miles / hour x 10 minutes   Patient needs occasional verbal cueing to improve posture and cueing to correctly perform exercises slowly, holding at end of range to increase motor firing of desired muscle to encourage fatigue.                           PT Education - 04/07/16 1806    Education provided Yes   Education Details HEP   Person(s) Educated Patient   Methods Explanation   Comprehension Verbalized understanding             PT Long Term Goals - 02/25/16 1540    PT LONG TERM GOAL #1   Title  Patient will improve Dynamic Gait Index (DGI) score to > 19/24 for low falls risk regarding dynamic walking tasks    Time 12   Period Weeks   Status On-going   PT LONG TERM GOAL #2   Title Patient will increase Berg Balance score by > 6 points to demonstrate decreased fall risk during functional activities    Time 12   Period Weeks   Status On-going   PT LONG TERM GOAL #3   Title Patient will be able to transfer in and out of a large car with Patient will be able to transfer in and out of a high seated car with CGA    Time 12   Period Weeks   Status On-going   PT LONG TERM GOAL #4   Title Patient will complete a TUG test in < 12 seconds for independent mobility and decreased fall risk    Time 12   Period Weeks   Status On-going   PT LONG TERM GOAL #5   Time 12   Status On-going   PT LONG TERM GOAL #6   Time 12   Status On-going   PT LONG TERM GOAL #8   Time 12   Period Weeks  Status On-going   PT LONG TERM GOAL  #9   Time 12   Period Weeks   Status On-going   PT LONG TERM GOAL  #10   Time 12   Period Weeks   Status On-going               Plan - 04/07/16 1812    Clinical Impression Statement PT provided min verbal instruction to improve set up, proper use of LE, and improved posture and gait mechanics. Patient responded moderately to instruction   Rehab Potential Good   Clinical Impairments Affecting Rehab Potential weakness and decreased standing balance   PT Frequency 1x / week   PT Duration 12 weeks   PT Treatment/Interventions Therapeutic exercise;Therapeutic activities;Gait training;Balance training;Stair training;DME Instruction;Neuromuscular re-education;Patient/family education   PT Next Visit Plan Continue with core and LE strengthening and TM ambulation   PT Home Exercise Plan Continued from previous sessions.    Consulted and Agree with Plan of Care Patient      Patient will benefit from skilled therapeutic intervention in order to improve the  following deficits and impairments:  Abnormal gait, Decreased balance, Decreased endurance, Difficulty walking, Decreased strength  Visit Diagnosis: Muscle weakness (generalized)  Difficulty in walking, not elsewhere classified     Problem List There are no active problems to display for this patient. [name], PT, DPT  [place], [place] 04/08/2016, 2:16 PM  [place] [facility] MAIN [facility] SERVICES [address] [place], [place], [address] Phone: [phone]   Fax:  [phone]  Name: Michelle Randall MRN: [id number] Date of Birth: [birth year]

## 2016-04-09 ENCOUNTER — Encounter: Payer: Medicaid Other | Admitting: Occupational Therapy

## 2016-04-14 ENCOUNTER — Ambulatory Visit: Payer: Medicaid Other | Admitting: Physical Therapy

## 2016-04-14 ENCOUNTER — Ambulatory Visit: Payer: Medicaid Other | Admitting: Occupational Therapy

## 2016-04-14 DIAGNOSIS — M6281 Muscle weakness (generalized): Secondary | ICD-10-CM | POA: Diagnosis not present

## 2016-04-14 DIAGNOSIS — R262 Difficulty in walking, not elsewhere classified: Secondary | ICD-10-CM

## 2016-04-14 DIAGNOSIS — R278 Other lack of coordination: Secondary | ICD-10-CM

## 2016-04-14 NOTE — Therapy (Signed)
[place] MAIN [facility] SERVICES [address] [place], [place], [address] Phone: [phone]   Fax:  [phone]  Occupational Therapy Treatment  Patient Details  Name: Michelle Randall MRN: [id number] Date of Birth: [birth year] No Data Recorded  Encounter Date: 04/14/2016    No past medical history on file.  No past surgical history on file.  There were no vitals filed for this visit.                                 OT Long Term Goals - 04/14/16 1628    OT LONG TERM GOAL #1   Title (p) Patient will demonstrate methods of picking up her bookbag and placing it onto the back of her chair with modified independence to transport items to class.   Baseline (p) requires assistance from others to complete now but will need to be independent when living alone at college.   Time (p) 6   Period (p) Months   Status (p) On-going   OT LONG TERM GOAL #2   Title (p) Patient will be able to make her bed with modified independence.   Time (p) 6   Period (p) Months   Status (p) On-going   OT LONG TERM GOAL #3   Title (p) Patient will demonstrate the ability to perform vacuuming of one room with modified independence using light weight vacuum.   Baseline (p) unable to perform currently.   Time (p) 6   Period (p) Months   Status (p) On-going   OT LONG TERM GOAL #4   Title (p) Patient will demonstrate the ability to hang clothes onto hangers to place in the closet with modified independence.   Baseline (p) requires help from mom at this time, moderate assist with pants   Time (p) 6   Period (p) Months   Status (p) Achieved   OT LONG TERM GOAL #5   Title (p) Patient will improve grip strength to be able to achieve functional goals listed   Time (p) 12   Period (p) Months   Status (p) Achieved   OT LONG TERM GOAL #6   Title (p) Will now be able to go through a door that swings out using motorized wheel chair.   Baseline (p)  difficulty depending on type of door and set up of the area   Time (p) 6   Period (p) Months   Status (p) On-going   OT LONG TERM GOAL #7   Title (p) (Higher level grip) Will be able to squeeze a lemon during cooking.   Baseline (p) cannot fully squeeze lemon to get juice out, exploring the use of a lemon press to assist   Time (p) 6   Period (p) Months   Status (p) Achieved   OT LONG TERM GOAL #8   Title (p) Patient will be able to donn night leg brace independently before she goes to college.   Time (p) 6   Period (p) Months   Status (p) Achieved   OT LONG TERM GOAL  #9   Baseline (p) Patient will be able to braid her own hair before college   Time (p) 6   Period (p) Months   Status (p) On-going   OT LONG TERM GOAL  #10   TITLE (p) Wll be able to wash her own hair before starting college   Baseline (p) mom still  has to help with more than 20% of task now down from 50%.     Time (p) 6   Period (p) Months   Status (p) On-going   OT LONG TERM GOAL  #11   TITLE (p) Will be able to squeeze tooth paste out of the tube when it is less than half full.   Baseline (p) still requires assist if tube is less than 1/2 full.   Time (p) 6   Period (p) Months   Status (p) Partially Met   OT LONG TERM GOAL  #12   TITLE (p) Patient will be able to donn/doff crutch tips.   Baseline (p) crutch tips are difficult for anyone to take them off, goal deferred.   Time (p) 6   Period (p) Months   Status (p) Not Met   OT LONG TERM GOAL  #13   TITLE (p) Patient will be able to complete 2 1/2 minutes straight with left shoulder flexion simulating driving with a stearing wheel ball to be able to drive to college.    Baseline (p) can do 30 second of shoulder flexion with 2 1/2 before fatigue and can only drive 15 min before right shoulder fatigues.   Time (p) 6   Period (p) Months   Status (p) On-going   OT LONG TERM GOAL  #14   TITLE (p) Will be able to open taped boxes (for moving to college) using a  key or knife.   Baseline (p) ongoing techniques explored with adaptive devices to assist.   Time (p) 6   Period (p) Months   Status (p) On-going   OT LONG TERM GOAL  #15   TITLE (p)  cut layers of paper which she will have to do for craft classes in college.   Time (p) 6   Period (p) Months   Status (p) Achieved             Patient will benefit from skilled therapeutic intervention in order to improve the following deficits and impairments:     Visit Diagnosis: Muscle weakness (generalized)  Other lack of coordination    Problem List There are no active problems to display for this patient.   Lovett,Amy 04/14/2016, 9:19 PM  [place] MAIN [facility] SERVICES [address] [place], [place], [address] Phone: [phone]   Fax:  [phone]  Name: Michelle Randall MRN: [id number] Date of Birth: 1996-04-05

## 2016-04-14 NOTE — Therapy (Signed)
[place] [facility] [address] [place], [place], [address] Phone: [phone]   Fax:  [phone]  Physical Therapy Treatment  Patient Details  Name: Michelle Randall MRN: [id number] Date of Birth: Jul 07, 1996 No Data Recorded  Encounter Date: 04/14/2016      PT End of Session - 04/14/16 1722    Visit Number 38   Number of Visits 44   Date for PT Re-Evaluation 05/19/16   Authorization Type medicaid   PT Start Time 0510   PT Stop Time 0548   PT Time Calculation (min) 38 min   Activity Tolerance Patient tolerated treatment well   Behavior During Therapy [facility] for tasks assessed/performed      No past medical history on file.  No past surgical history on file.  There were no vitals filed for this visit.  Therapeutic exercise and neuromuscular training: 1/2 foam and head turns x 2 mins standing hip abd with YTB x 20  side stepping left and right in parallel bars 10 feet x 3 standing on blue foam with cone reaching x 20 across midline step ups from floor to 6 inch stool x 20 bilateral TM x 1.0 elevation 3 x 10 minutes Patient needs occasional verbal cueing to improve posture and cueing to correctly perform exercises slowly, holding at end of range to increase motor firing of desired muscle to encourage fatigue.                            PT Education - 04/14/16 1721    Education provided Yes   Education Details HEP   Person(s) Educated Patient   Methods Explanation   Comprehension Verbalized understanding             PT Long Term Goals - 02/25/16 1540    PT LONG TERM GOAL #1   Title Patient will improve Dynamic Gait Index (DGI) score to > 19/24 for low falls risk regarding dynamic walking tasks    Time 12   Period Weeks   Status On-going   PT LONG TERM GOAL #2   Title Patient will increase Berg Balance score by > 6 points to demonstrate decreased fall risk during functional activities     Time 12   Period Weeks   Status On-going   PT LONG TERM GOAL #3   Title Patient will be able to transfer in and out of a large car with Patient will be able to transfer in and out of a high seated car with CGA    Time 12   Period Weeks   Status On-going   PT LONG TERM GOAL #4   Title Patient will complete a TUG test in < 12 seconds for independent mobility and decreased fall risk    Time 12   Period Weeks   Status On-going   PT LONG TERM GOAL #5   Time 12   Status On-going   PT LONG TERM GOAL #6   Time 12   Status On-going   PT LONG TERM GOAL #8   Time 12   Period Weeks   Status On-going   PT LONG TERM GOAL  #9   Time 12   Period Weeks   Status On-going   PT LONG TERM GOAL  #10   Time 12   Period Weeks   Status On-going               Plan -  04/14/16 1723    Clinical Impression Statement CGA needed throughout with an increase in postural sway while on 1/2 foam.. Verbal cues needed to keep alternating pattern with steps,   Rehab Potential Good   Clinical Impairments Affecting Rehab Potential weakness and decreased standing balance   PT Frequency 1x / week   PT Duration 12 weeks   PT Treatment/Interventions Therapeutic exercise;Therapeutic activities;Gait training;Balance training;Stair training;DME Instruction;Neuromuscular re-education;Patient/family education   PT Next Visit Plan Continue with core and LE strengthening and TM ambulation   PT Home Exercise Plan Continued from previous sessions.    Consulted and Agree with Plan of Care Patient      Patient will benefit from skilled therapeutic intervention in order to improve the following deficits and impairments:  Abnormal gait, Decreased balance, Decreased endurance, Difficulty walking, Decreased strength  Visit Diagnosis: Muscle weakness (generalized)  Difficulty in walking, not elsewhere classified     Problem List There are no active problems to display for this patient. [name],  PT, DPT  [place], [place] S 04/14/2016, 5:44 PM  [place] [facility] MAIN [place] SERVICES [address] [place], [place], [address] Phone: [phone]   Fax:  [phone]  Name: Michelle Randall MRN: [id number] Date of Birth: [birth year]

## 2016-04-15 ENCOUNTER — Encounter: Payer: Self-pay | Admitting: Occupational Therapy

## 2016-04-15 NOTE — Therapy (Signed)
[place] MAIN [facility] SERVICES [address] [place], [place], [address] Phone: [phone]   Fax:  [phone]  Occupational Therapy Treatment  Patient Details  Name: Michelle Randall MRN: [id number] Date of Birth: Jun 14, 1996 No Data Recorded  Encounter Date: 04/14/2016      OT End of Session - 04/15/16 1403    Visit Number 20   Number of Visits 24   Date for OT Re-Evaluation 07/09/16   Authorization Type medicaid   OT Start Time 1620   OT Stop Time 1700   OT Time Calculation (min) 40 min   Activity Tolerance Patient tolerated treatment well   Behavior During Therapy [facility] for tasks assessed/performed      History reviewed. No pertinent past medical history.  History reviewed. No pertinent past surgical history.  There were no vitals filed for this visit.      Subjective Assessment - 04/15/16 1337    Subjective  Patient reports she is finishing up with school soon and will be graduating, plans to go to [employer] in the fall.  She reports she still has trouble with bra donning, tying shorter shoe laces, in addition to ongoing goals listed below.     Patient is accompained by: Family member   Patient Stated Goals wants to be independent with all tasks so she will be able to go away to college.   Currently in Pain? No/denies   Multiple Pain Sites No                      OT Treatments/Exercises (OP) - 04/15/16 1409    ADLs   ADL Comments Reassessment of self care tasks see below in the assessment area for details.   Fine Motor Coordination   Other Fine Motor Exercises Reassessment of coordination skills, 9 hole peg test on the right 21 secs, left 23 secs.  Still has difficulty with bra closures, extended periods of handwriting, typing, slow to manage fasteners.     Neurological Re-education Exercises   Other Exercises 1 Reassessment of skills this date as follows: Grip strength right 55#, left 50#, lateral pinch right 14#,  left 12#, 3 point pinch 19#, left 15#. BUE strength 4-/5. Continues to have difficulty managing her bookbag from the floor to lap and from lap to hang in the back of the chair.                 OT Education - 04/15/16 1402    Education provided Yes   Education Details Reviewed therapists initial recommendations to the college for fall in regards to access, equipment and environmental/safety.   Person(s) Educated Patient;Parent(s)   Methods Demonstration;Verbal cues   Comprehension Returned demonstration;Verbalized understanding             OT Long Term Goals - 04/14/16 1628    OT LONG TERM GOAL #1   Title Patient will demonstrate methods of picking up her bookbag and placing it onto the back of her chair with modified independence to transport items to class.   Baseline requires assistance from others to complete now but will need to be independent when at college in the fall .   Time 6   Period Months   Status On-going   OT LONG TERM GOAL #2   Title Patient will be able to make her bed with modified independence.   Baseline she continues to have difficulty with balance and completing this task, moderate assist.   Time  6   Period Months   Status On-going   OT LONG TERM GOAL #3   Title Patient will demonstrate the ability to perform vacuuming of one room with modified independence using light weight vacuum.   Baseline still requires max assist   Time 6   Period Months   Status On-going   OT LONG TERM GOAL #4   Title Patient will demonstrate the ability to hang clothes onto hangers to place in the closet with modified independence.   Baseline --   Time 6   Period Months   Status Achieved   OT LONG TERM GOAL #5   Title Patient will demonstrate donning and doffing her bra with modified independence.   Baseline max assist   Time 12   Period Weeks   Status New   OT LONG TERM GOAL #6   Title Will now be able to go through a door that swings out using motorized wheel  chair.   Baseline difficulty depending on type of door and set up of the area, at times requires max assistance depending on the weight of the door.   Time 6   Period Months   Status On-going   OT LONG TERM GOAL #7   Title (Higher level grip) Will be able to squeeze a lemon during cooking.   Baseline --   Time 6   Period Months   Status Achieved   OT LONG TERM GOAL #8   Title Patient will be able to donn night leg brace independently before she goes to college.   Time 6   Period Months   Status Achieved   OT LONG TERM GOAL  #9   Baseline Patient will be able to braid her own hair before college   Time 6   Period Months   Status On-going   OT LONG TERM GOAL  #10   TITLE Wll be able to wash her own hair before starting college   Baseline mom still has to help with more than 20% of task now down from 50%.     Time 6   Period Months   Status On-going   OT LONG TERM GOAL  #11   TITLE Will be able to squeeze tooth paste out of the tube when it is less than half full.   Baseline still requires min assist if tube is less than 1/2 full.   Time 6   Period Months   Status Partially Met   OT LONG TERM GOAL  #12   TITLE Patient will demonstrate managing wheelchair rain poncho in inclement weather with modified independence.   Baseline --   Time 6   Period Months   Status New   OT LONG TERM GOAL  #13   TITLE Patient will be able to complete 2 1/2 minutes straight with left shoulder flexion simulating driving with a stearing wheel ball to be able to drive to college.    Baseline can do 30 second of shoulder flexion with 2 1/2 before fatigue and can only drive 15 min before right shoulder fatigues.   Time 6   Period Months   Status On-going   OT LONG TERM GOAL  #14   TITLE Will be able to open taped boxes (for moving to college) using a key or knife.   Baseline ongoing techniques explored with adaptive devices to assist.   Time 6   Period Months   Status On-going   OT LONG TERM  GOAL  #15  TITLE Patient will be able to obtain food tray in the cafeteria from a wheelchair level and take to the table without spilling with modified independence.    Baseline max assist   Time 6   Period Months   Status New               Plan - 04/15/16 1405    Clinical Impression Statement Reassessment of ADL/IADL skills with goals updated.  Patient is progressing well in all areas at home meeting her goals of being able to hang clothes on hangers to place into the closet using a modified technique, squeeze a lemon for cooking at home with use of lemon press, don her leg brace for night time, and cutting paper with adapted scissors.  She has partially met the goals of squeezing toothpaste out, picking up and hanging her filled book bag onto her wheelchair, making her bed, braiding her hair down the back, and managing doors in the community.  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  She will need to be able to get in and out of the buildings with her wheelchair, access the bathrooms, manage in the cafeteria with a food tray, don and doff a rain poncho in inclement weather, tie her shoelaces and be independent with dressing including her bra in the mornings.  She is a very focused and motivated individual and her goal is to be as independent as possible.  She can currently lift and manage a 5# book bag however, in college she will need to lift greater than 5# to manage a laptop/tablet and necessary supplies for the day.  During the month of August before school starts, therapist plans to meet patient at the school and assess all areas of the environment and match her limitations to make additional recommendations to the office of disabilities to ensure accessibility and offer adaptations as needed specific to the patient in the context of her environment.  Therapist is in the process  of making first round of recommendations to patient's CAP program and office of disabilities at [facility].  Recommend increasing frequency of treatment from 1 to 2 times a week to focus on college related tasks during the summer in preparation for the transition.   Rehab Potential Good   OT Frequency 2x / week   OT Duration 12 weeks   OT Treatment/Interventions Self-care/ADL training;Therapeutic exercise;Therapeutic exercises;Therapeutic activities   Consulted and Agree with Plan of Care Patient   Family Member Consulted mom      Patient will benefit from skilled therapeutic intervention in order to improve the following deficits and impairments:  Impaired UE functional use, Difficulty walking, Decreased strength, Decreased coordination, Decreased endurance, Decreased balance  Visit Diagnosis: Muscle weakness (generalized)  Other lack of coordination    Problem List There are no active problems to display for this patient.  Achilles Dunk, OTR/L, CLT  Alejandra Hunt 04/15/2016, 2:10 PM  [place] MAIN [facility] SERVICES [address] [place], [place], [address] Phone: [phone]   Fax:  [phone]  Name: Michelle Randall MRN: [id number] Date of Birth: 1996-05-07

## 2016-04-15 NOTE — Addendum Note (Signed)
Addended by: Derrek GuLOVETT, Tatsuo Musial T on: 04/15/2016 02:13 PM   Modules accepted: Orders

## 2016-04-16 ENCOUNTER — Encounter: Payer: Medicaid Other | Admitting: Occupational Therapy

## 2016-04-19 ENCOUNTER — Encounter: Payer: Medicaid Other | Admitting: Occupational Therapy

## 2016-04-21 ENCOUNTER — Ambulatory Visit: Payer: Medicaid Other | Admitting: Occupational Therapy

## 2016-04-21 ENCOUNTER — Encounter: Payer: Self-pay | Admitting: Physical Therapy

## 2016-04-21 ENCOUNTER — Ambulatory Visit: Payer: Medicaid Other | Admitting: Physical Therapy

## 2016-04-21 ENCOUNTER — Ambulatory Visit: Payer: Medicaid Other | Attending: Pediatrics | Admitting: Physical Therapy

## 2016-04-21 DIAGNOSIS — R278 Other lack of coordination: Secondary | ICD-10-CM | POA: Diagnosis present

## 2016-04-21 DIAGNOSIS — M6281 Muscle weakness (generalized): Secondary | ICD-10-CM

## 2016-04-21 DIAGNOSIS — R262 Difficulty in walking, not elsewhere classified: Secondary | ICD-10-CM

## 2016-04-21 NOTE — Therapy (Signed)
[facility] MAIN [facility] SERVICES [address] [place], [place], [address] Phone: [phone]   Fax:  [phone]  Physical Therapy Treatment  Patient Details  Name: VALLERY MCDADE MRN: [id number] Date of Birth: February 19, 1996 No Data Recorded  Encounter Date: 04/21/2016      PT End of Session - 04/21/16 1637    Visit Number 39   Number of Visits 44   Date for PT Re-Evaluation 05/19/16   Authorization Type medicaid   PT Start Time 0445   PT Stop Time 0530   PT Time Calculation (min) 45 min   Equipment Utilized During Treatment Gait belt   Activity Tolerance Patient tolerated treatment well;Patient limited by fatigue   Behavior During Therapy [facility] for tasks assessed/performed      History reviewed. No pertinent past medical history.  History reviewed. No pertinent past surgical history.  There were no vitals filed for this visit.      Subjective Assessment - 04/21/16 1628    Subjective pt reports she would like to get stronger and do curbs better   Patient is accompained by: Family member   Limitations Walking   Patient Stated Goals Patient wants to improve her core strength.       Therapeutic exercise:  Ascending / descending steps with 1 loftstrand crutch and 1 rail x 4 steps x 3 repetitions Side stepping in parallel bars with YTB x 10 feet x 5  Standing hip extension with YTB x 20 BLE Step ups to  6 inch stool x 20 Tilt board fwd/bwd, side to side   Patient needs occasional verbal cueing to improve posture and cueing to correctly perform exercises slowly, holding at end of range to increase motor firing of desired muscle to encourage fatigue.                            PT Education - 04/21/16 1635    Education provided Yes   Education Details safety with steps and curbs   Person(s) Educated Patient   Methods Explanation   Comprehension Verbalized understanding             PT Long Term Goals -  02/25/16 1540    PT LONG TERM GOAL #1   Title Patient will improve Dynamic Gait Index (DGI) score to > 19/24 for low falls risk regarding dynamic walking tasks    Time 12   Period Weeks   Status On-going   PT LONG TERM GOAL #2   Title Patient will increase Berg Balance score by > 6 points to demonstrate decreased fall risk during functional activities    Time 12   Period Weeks   Status On-going   PT LONG TERM GOAL #3   Title Patient will be able to transfer in and out of a large car with Patient will be able to transfer in and out of a high seated car with CGA    Time 12   Period Weeks   Status On-going   PT LONG TERM GOAL #4   Title Patient will complete a TUG test in < 12 seconds for independent mobility and decreased fall risk    Time 12   Period Weeks   Status On-going   PT LONG TERM GOAL #5   Time 12   Status On-going   PT LONG TERM GOAL #6   Time 12   Status On-going   PT LONG TERM GOAL #8  Time 12   Period Weeks   Status On-going   PT LONG TERM GOAL  #9   Time 12   Period Weeks   Status On-going   PT LONG TERM GOAL  #10   Time 12   Period Weeks   Status On-going               Plan - 04/21/16 1637    Rehab Potential Good   Clinical Impairments Affecting Rehab Potential weakness and decreased standing balance   PT Frequency 1x / week   PT Duration 12 weeks   PT Treatment/Interventions Therapeutic exercise;Therapeutic activities;Gait training;Balance training;Stair training;DME Instruction;Neuromuscular re-education;Patient/family education   PT Next Visit Plan Continue with core and LE strengthening and TM ambulation   PT Home Exercise Plan Continued from previous sessions.    Consulted and Agree with Plan of Care Patient      Patient will benefit from skilled therapeutic intervention in order to improve the following deficits and impairments:  Abnormal gait, Decreased balance, Decreased endurance, Difficulty walking, Decreased strength  Visit  Diagnosis: Muscle weakness (generalized)  Difficulty in walking, not elsewhere classified     Problem List There are no active problems to display for this patient.  Ezekiel InaKristine S Keyanni Whittinghill, PT, DPT [place], Barkley BrunsKristine S 04/21/2016, 4:39 PM  [facility] [address] [place], [place], [address] Phone: [phone]   Fax:  [phone]  Name: Julaine HuaMisbah N Karner MRN: [id number] Date of Birth: 1996/02/03

## 2016-04-22 ENCOUNTER — Encounter: Payer: Medicaid Other | Admitting: Occupational Therapy

## 2016-04-23 NOTE — Therapy (Signed)
[facility] MAIN [facility] SERVICES [address] 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  She has been seen for Occupational Therapy in our clinic for the last year+ with focus on management of her impairments and development of her activities of daily living/instrumental activities of daily living skills for home, community and school related tasks.  She presents with bilateral muscle weakness in both her upper extremities and lower extremities, decreased range of motion, decreased coordination (gross motor/fine motor), decreased mobility skills and decreased ability to perform necessary daily tasks.   I have had the pleasure of working one on one with Michelle Randall over the last year with the focus on transitioning/preparation from high school to college which encompasses all of her daily activities not only at school but home as well.  She is an extremely focused, conscientious student who is very friendly and compassionate.  She is easy going, always smiling and positive with all her interactions.  She is self-motivated and is willing to try any task asked of her without ever complaining.  She is extremely flexible in her approach to all tasks and wants to be as independent as possible.  When given exercises and assignments, she is consistent with her follow through at home and it shows in her gains with therapy.   Michelle Randall has continued and will continue to receive Occupational Therapy and Physical Therapy to address her above stated physical limitations.  The following are a list of implications of her  limitations in her daily tasks which will need to be considered with her transition to college to be successful: 1) Michelle Randall demonstrates upper extremity weakness which affects her ability for note taking in the classroom.  While she is able to complete short notes in class; however, she lacks endurance to complete notes for longer lectures as anticipated in college.  This can be problematic for her since she will often miss important information in her notes which affects her ability to complete assignments and thoroughness to be tested on the information.  Her learning style is primarily visual in nature combined with auditory. Recommendations include:   a) Use of lightweight computer/tablet for note taking in class and as needed during testing for completion of essay style questions.  (see additional considerations at the end of this report) b) Ability to record lectures in an audio or visual format which will allow her to pay more attention in class and go back and review information at a later time at her own pace.   c) Offer her PowerPoint presentations prior to class starting so she will have the information and format available to follow during lecture.  d) Dictation software for her to use when completing longer projects such as research papers and assignments to avoid excessive writing or typing.  (example, Dragon Naturally speaking)  2) With the lack of strength, Michelle Randall also demonstrates difficulty with lifting items such as a filled book bag with her necessary school supplies.  She is not able to carry textbooks to or from school. Recommendations include: a) The use of a light weight computer/tablet and have E-textbooks  available by download/website access during class and at home.  This will eliminate the need to carry textbooks on campus or transport them to and from school for completing assignments.    b) She may require the use of 2 book bags which she can hang on her wheelchair to manage  the remainder of her supplies such as writing utensils, notebooks, calculator, and a lightweight computer for notetaking and access to text books.   3) Given her decreased coordination skills, she may require additional time to access items in her book bag, opening/closing book bag, opening containers to access writing utensils, etc.  Recommendations:  a) Discussed with Michelle Randall the importance of arriving early to class to allow time to prepare. b) Have the college assist with scheduling of classes to allow extra time to get from one class to the next and have time to arrive a few minutes early.    4) Since Michelle Randall has a neurogenic bowel and bladder, she requires rest room breaks every 2 hours.  The bathrooms must be handicapped accessible and she must be able to get into and out of the bathroom with her wheelchair and/or loft strand crutches. Recommendations:   a)  Again, assistance with scheduling of classes to allow time in between if needed to access the restroom. b) During extended classes which are over 2 hours in length, she will need a break in order to complete her toileting needs.  If a break is incorporated into the class for all students, the teacher may have to consider that Michelle Randall will likely need additional time to return to class and be given an exception for late return.   c) Recommend family be allowed to have Michelle Randall come to access the buildings prior to starting school to "try out" the bathrooms to make sure they are accessible.  If they are not, she may have to be assigned a helper who can accompany her to and from the bathroom when needed.  d) She will likely transfer from her wheelchair to a regular chair during class and will need to be            positioned at the end of a row permanently so she will have access to her wheelchair to exit the class for toileting needs. She does prefer to sit in the front of the class which helps her to focus and learn best.    5)  Navigation around  campus in her wheelchair to access buildings and classrooms. Recommendations: a)  It is difficult for her family to bring her electric wheelchair to and from home every day and it would be optimal if she can be allowed to store her chair at the college each night. b) She will need to have access to the area where the chair is stored each day, including easy access for drop off or handicapped parking close to the building to get in and out. c) During periods of inclement weather, it is recommended she have a wheelchair poncho to keep dry when going from building to building.  Optimally, she would benefit from a light weight poncho to be used during the warm weather months and a winter poncho which has a fleece lining for colder weather.  This will allow her to remove the poncho easily and not have to wear multiple layers of clothing which she would have to remove when getting to class.  If she does not have a heavier poncho she will require increased time to remove additional  clothing before being able to start class.   6)  Safety considerations in the classroom include keeping pathways clear so Michelle Randall can maneuver her wheelchair to and from her seat and access to a seat at the end of an aisle to be able to exit the class quickly in case of fire or unexpected evacuation of the area.  She may need an assigned person in the classroom whether it be a professor or student who will assist her out of the area. A plan should be in place on the first day of class in event of an emergency evacuation of the classroom.   7) Management of food in the cafeteria including obtaining drinks from a machine from wheelchair level, food from the line, accessing utensils etc.  a)  May need practice prior to start of classes to see if she is able to manage going through the line and getting food, she may require a food tray.  She will then need practice holding a food tray in her lap while navigating her wheelchair, as well as  lifting the tray to the table. b) If she is not able to do this, she may require assistance in the cafeteria to manage getting her food/items.   Additional Considerations: Michelle Randall will absolutely require the use of a tablet and/or laptop computer to be able to complete note taking and assignments.  Optimally, she may benefit from both items for a variety of reasons based on her limitations.  A tablet would provide increased portability for her to manage to and from school each day and it is lightweight for her to be able to lift out of her book bag.  It often has and extended battery life compared to other electronic devices which will be important given the extended time she will be at school each day.  The negatives of a tablet are often the lack of storage space if eBooks are downloaded and housed on the tablet itself.  It is also not as suitable for the creation of presentations, use of multiple apps or for heavy researching of information to complete assignments.   The benefits of a lap top computer include advanced performance of research, ability to switch between screens and access to multiple apps at a time, increased capability for storage of eBooks, assignments, and associated programs she will likely need in college. A laptop is better equipped for intense tasks, has better precision with word processing and improved ability to manage more in-depth programs.  A laptop often has a wider range of uses and is faster to process information.   If a tablet is chosen for Michelle Randall, she will need a detachable keyboard in order to take notes since she has difficulty with managing a touch screen for notetaking.  If a laptop computer is chosen, the laptop needs to be the lightest weight possible for her to pick up and manage placing into and out of her book bag.   Ideally, she would likely benefit the most from a tablet for school use and a laptop at home for more complex tasks, either way both will need to be  lightweight, portable and versatile for use.    For additional information, please contact me at [phone].   Kerrie Buffalomy T Lovett, OTR/L, CLT  Lovett,Amy 04/23/2016, 1:58 PM  [facility] MAIN [facility] [address] [place], [place], [address] Phone: [phone]   Fax:  [phone]

## 2016-04-23 NOTE — Therapy (Signed)
[place] MAIN [facility] SERVICES [address] [place], [place], [address] Phone: [phone]   Fax:  [phone]  Occupational Therapy Treatment  Patient Details  Name: RHYS ANCHONDO MRN: [id number] Date of Birth: Sep 02, 1996 No Data Recorded  Encounter Date: 04/21/2016      OT End of Session - 04/23/16 1354    Visit Number 21   Number of Visits 24   Date for OT Re-Evaluation 07/09/16   Authorization Type medicaid   OT Start Time 1600   OT Stop Time 1645   OT Time Calculation (min) 45 min   Activity Tolerance Patient tolerated treatment well   Behavior During Therapy [facility] for tasks assessed/performed      No past medical history on file.  No past surgical history on file.  There were no vitals filed for this visit.      Subjective Assessment - 04/23/16 1343    Subjective  Patient reports she is excited about graduation on Monday, her family is coming to town and will all be there for her.  She reports she has had continued difficulty with managing her bookbag, around 8# to pick up and place on the back of her wheelchair.     Patient is accompained by: Family member   Patient Stated Goals wants to be independent with all tasks so she will be able to go away to college.   Multiple Pain Sites No                      OT Treatments/Exercises (OP) - 04/23/16 1345    ADLs   ADL Comments Patient seen this date for school activities including lifting and managing a filled bookbag from the floor to the back of the chair, 6# and 8#, for multiple repetitions, taking items out of bookbag and refilling.  Cues for alternative techniques with lifting, shifting, moving bag from a variety of positions.  Simulation of hair braiding with thick nylon cord with vision and vision occluded, cues as needed and no rest breaks for arms. Handwriting tasks with focus on endurance of writing longer passages for note taking in school.      Neurological Re-education Exercises   Other Exercises 1 Patient seen for pinch strength tasks with right and left hands for all levels of resistive pinch, lateral and 3 point and placing on elevated surface above shoulder height, cues as needed. Resistive putty for grip and additional pinches for 3 sets, mulitple reps.                 OT Education - 04/23/16 1354    Education provided Yes   Education Details Hep, distribution of book bag and supplies   Person(s) Educated Patient   Methods Explanation;Demonstration;Verbal cues   Comprehension Verbal cues required;Returned demonstration;Verbalized understanding             OT Long Term Goals - 04/14/16 1628    OT LONG TERM GOAL #1   Title Patient will demonstrate methods of picking up her bookbag and placing it onto the back of her chair with modified independence to transport items to class.   Baseline requires assistance from others to complete now but will need to be independent when at college in the fall .   Time 6   Period Months   Status On-going   OT LONG TERM GOAL #2   Title Patient will be able to make her bed with modified independence.   Baseline  she continues to have difficulty with balance and completing this task, moderate assist.   Time 6   Period Months   Status On-going   OT LONG TERM GOAL #3   Title Patient will demonstrate the ability to perform vacuuming of one room with modified independence using light weight vacuum.   Baseline still requires max assist   Time 6   Period Months   Status On-going   OT LONG TERM GOAL #4   Title Patient will demonstrate the ability to hang clothes onto hangers to place in the closet with modified independence.   Baseline --   Time 6   Period Months   Status Achieved   OT LONG TERM GOAL #5   Title Patient will demonstrate donning and doffing her bra with modified independence.   Baseline max assist   Time 12   Period Weeks   Status New   OT LONG TERM GOAL #6    Title Will now be able to go through a door that swings out using motorized wheel chair.   Baseline difficulty depending on type of door and set up of the area, at times requires max assistance depending on the weight of the door.   Time 6   Period Months   Status On-going   OT LONG TERM GOAL #7   Title (Higher level grip) Will be able to squeeze a lemon during cooking.   Baseline --   Time 6   Period Months   Status Achieved   OT LONG TERM GOAL #8   Title Patient will be able to donn night leg brace independently before she goes to college.   Time 6   Period Months   Status Achieved   OT LONG TERM GOAL  #9   Baseline Patient will be able to braid her own hair before college   Time 6   Period Months   Status On-going   OT LONG TERM GOAL  #10   TITLE Wll be able to wash her own hair before starting college   Baseline mom still has to help with more than 20% of task now down from 50%.     Time 6   Period Months   Status On-going   OT LONG TERM GOAL  #11   TITLE Will be able to squeeze tooth paste out of the tube when it is less than half full.   Baseline still requires min assist if tube is less than 1/2 full.   Time 6   Period Months   Status Partially Met   OT LONG TERM GOAL  #12   TITLE Patient will demonstrate managing wheelchair rain poncho in inclement weather with modified independence.   Baseline --   Time 6   Period Months   Status New   OT LONG TERM GOAL  #13   TITLE Patient will be able to complete 2 1/2 minutes straight with left shoulder flexion simulating driving with a stearing wheel ball to be able to drive to college.    Baseline can do 30 second of shoulder flexion with 2 1/2 before fatigue and can only drive 15 min before right shoulder fatigues.   Time 6   Period Months   Status On-going   OT LONG TERM GOAL  #14   TITLE Will be able to open taped boxes (for moving to college) using a key or knife.   Baseline ongoing techniques explored with adaptive  devices to assist.   Time 6  Period Months   Status On-going   OT LONG TERM GOAL  #15   TITLE Patient will be able to obtain food tray in the cafeteria from a wheelchair level and take to the table without spilling with modified independence.    Baseline max assist   Time 6   Period Months   Status New               Plan - 04/23/16 1355    Clinical Impression Statement Patient will require additional focus on management of bookbag with electronic device inside for college in the fall, she will need to lift off of her wheelchair handle on the back, bring the bag to her, open pockets to obtain objects and return them all .  Primary difficutly is lifting the bag on and off of the back of the chair.    Rehab Potential Good   OT Frequency 2x / week   OT Duration 12 weeks   OT Treatment/Interventions Self-care/ADL training;Therapeutic exercise;Therapeutic exercises;Therapeutic activities   Consulted and Agree with Plan of Care Patient      Patient will benefit from skilled therapeutic intervention in order to improve the following deficits and impairments:  Impaired UE functional use, Difficulty walking, Decreased strength, Decreased coordination, Decreased endurance, Decreased balance  Visit Diagnosis: Muscle weakness (generalized)  Other lack of coordination    Problem List There are no active problems to display for this patient.  Achilles Dunk, OTR/L, CLT  Govanni Plemons 04/23/2016, 1:58 PM  [place] MAIN [facility] SERVICES [address] [place], [place], [address] Phone: [phone]   Fax:  [phone]  Name: GENELDA ROARK MRN: [id number] Date of Birth: 1996/05/18

## 2016-04-26 ENCOUNTER — Encounter: Payer: Medicaid Other | Admitting: Occupational Therapy

## 2016-04-28 ENCOUNTER — Encounter: Payer: Medicaid Other | Admitting: Occupational Therapy

## 2016-04-28 ENCOUNTER — Ambulatory Visit: Payer: Medicaid Other | Admitting: Occupational Therapy

## 2016-04-28 ENCOUNTER — Ambulatory Visit: Payer: Medicaid Other | Admitting: Physical Therapy

## 2016-04-28 ENCOUNTER — Encounter: Payer: Self-pay | Admitting: Occupational Therapy

## 2016-04-28 DIAGNOSIS — M6281 Muscle weakness (generalized): Secondary | ICD-10-CM | POA: Diagnosis not present

## 2016-04-28 DIAGNOSIS — R278 Other lack of coordination: Secondary | ICD-10-CM

## 2016-04-28 DIAGNOSIS — R262 Difficulty in walking, not elsewhere classified: Secondary | ICD-10-CM

## 2016-04-28 NOTE — Therapy (Signed)
[place] MAIN [facility] SERVICES [address] [place], [place], [address] Phone: [phone]   Fax:  [phone]  Occupational Therapy Treatment  Patient Details  Name: Michelle Randall MRN: [id number] Date of Birth: 10/06/1996 No Data Recorded  Encounter Date: 04/28/2016      OT End of Session - 04/28/16 1700    Visit Number 22   Number of Visits 70   Date for OT Re-Evaluation 07/09/16   Authorization Type medicaid visit 1 of 75   OT Start Time 1603   OT Stop Time 1645   OT Time Calculation (min) 42 min   Activity Tolerance Patient tolerated treatment well   Behavior During Therapy [facility] for tasks assessed/performed      History reviewed. No pertinent past medical history.  History reviewed. No pertinent past surgical history.  There were no vitals filed for this visit.      Subjective Assessment - 04/28/16 1606    Currently in Pain? No/denies   Multiple Pain Sites No                      OT Treatments/Exercises (OP) - 04/28/16 1612    ADLs   ADL Comments Management of placing and removing bookbag from the back of the wheelchair handle on both left and right sides, bookbag with around 7# of weight.    Fine Motor Coordination   Other Fine Motor Exercises Braiding exercise with braid placed in front of patient 64 inches from the floor, reaching up from seated position to complete.  Then turned around and performed with braid to her back reaching overhead to simulate own hair. Arms fatigued but able to complete without a rest break today.     Neurological Re-education Exercises   Other Exercises 1 Patient seen for strengthening exercises for shoulder flexion, ABD, ADD, chest press, forwards circles, backwards circles with 2# dowel for 15 reps each.                  OT Education - 04/28/16 1700    Education provided Yes   Education Details Techniques for retrieving bookbag from back of wheelchair from left and  right sides.    Person(s) Educated Patient   Methods Explanation;Demonstration;Verbal cues   Comprehension Verbal cues required;Returned demonstration;Verbalized understanding             OT Long Term Goals - 04/14/16 1628    OT LONG TERM GOAL #1   Title Patient will demonstrate methods of picking up her bookbag and placing it onto the back of her chair with modified independence to transport items to class.   Baseline requires assistance from others to complete now but will need to be independent when at college in the fall .   Time 6   Period Months   Status On-going   OT LONG TERM GOAL #2   Title Patient will be able to make her bed with modified independence.   Baseline she continues to have difficulty with balance and completing this task, moderate assist.   Time 6   Period Months   Status On-going   OT LONG TERM GOAL #3   Title Patient will demonstrate the ability to perform vacuuming of one room with modified independence using light weight vacuum.   Baseline still requires max assist   Time 6   Period Months   Status On-going   OT LONG TERM GOAL #4   Title Patient will demonstrate the ability  to hang clothes onto hangers to place in the closet with modified independence.   Baseline --   Time 6   Period Months   Status Achieved   OT LONG TERM GOAL #5   Title Patient will demonstrate donning and doffing her bra with modified independence.   Baseline max assist   Time 12   Period Weeks   Status New   OT LONG TERM GOAL #6   Title Will now be able to go through a door that swings out using motorized wheel chair.   Baseline difficulty depending on type of door and set up of the area, at times requires max assistance depending on the weight of the door.   Time 6   Period Months   Status On-going   OT LONG TERM GOAL #7   Title (Higher level grip) Will be able to squeeze a lemon during cooking.   Baseline --   Time 6   Period Months   Status Achieved   OT LONG  TERM GOAL #8   Title Patient will be able to donn night leg brace independently before she goes to college.   Time 6   Period Months   Status Achieved   OT LONG TERM GOAL  #9   Baseline Patient will be able to braid her own hair before college   Time 6   Period Months   Status On-going   OT LONG TERM GOAL  #10   TITLE Wll be able to wash her own hair before starting college   Baseline mom still has to help with more than 20% of task now down from 50%.     Time 6   Period Months   Status On-going   OT LONG TERM GOAL  #11   TITLE Will be able to squeeze tooth paste out of the tube when it is less than half full.   Baseline still requires min assist if tube is less than 1/2 full.   Time 6   Period Months   Status Partially Met   OT LONG TERM GOAL  #12   TITLE Patient will demonstrate managing wheelchair rain poncho in inclement weather with modified independence.   Baseline --   Time 6   Period Months   Status New   OT LONG TERM GOAL  #13   TITLE Patient will be able to complete 2 1/2 minutes straight with left shoulder flexion simulating driving with a stearing wheel ball to be able to drive to college.    Baseline can do 30 second of shoulder flexion with 2 1/2 before fatigue and can only drive 15 min before right shoulder fatigues.   Time 6   Period Months   Status On-going   OT LONG TERM GOAL  #14   TITLE Will be able to open taped boxes (for moving to college) using a key or knife.   Baseline ongoing techniques explored with adaptive devices to assist.   Time 6   Period Months   Status On-going   OT LONG TERM GOAL  #15   TITLE Patient will be able to obtain food tray in the cafeteria from a wheelchair level and take to the table without spilling with modified independence.    Baseline max assist   Time 6   Period Months   Status New               Plan - 04/28/16 1702    Clinical Impression Statement Patient has difficulty  with lifting and managing a bookbag  greater than 7# to place on the back of the wheelchair, more difficulty placing on left side than right however both are difficult.  She is able to perform one or 2 reps before fatiguing and then can rest and resume for multiple trials with rest breaks as needed. She would benefit from  increased strengthening in bilateral wrists and hands to assist with this task.     Rehab Potential Good   OT Frequency 2x / week   OT Duration 12 weeks   OT Treatment/Interventions Self-care/ADL training;Therapeutic exercise;Therapeutic exercises;Therapeutic activities   Consulted and Agree with Plan of Care Patient      Patient will benefit from skilled therapeutic intervention in order to improve the following deficits and impairments:  Impaired UE functional use, Difficulty walking, Decreased strength, Decreased coordination, Decreased endurance, Decreased balance  Visit Diagnosis: Muscle weakness (generalized)  Other lack of coordination    Problem List There are no active problems to display for this patient.  Achilles Dunk, OTR/L, CLT  Daphnee Preiss 04/28/2016, 5:07 PM  [place] MAIN [facility] SERVICES [address] [place], [place], [address] Phone: [phone]   Fax:  [phone]  Name: Michelle Randall MRN: [id number] Date of Birth: 06/23/1996

## 2016-04-28 NOTE — Therapy (Signed)
[facility] MA[facility] [address] [place], [place], [address] Phone: [phone]   Fax:  [phone]  Physical Therapy Treatment  Patient Details  Name: Michelle Randall MRN: [id number] Date of Birth: July 18, 1996 No Data Recorded  Encounter Date: 04/28/2016      PT End of Session - 04/28/16 1701    Visit Number 40   Number of Visits 44   Date for PT Re-Evaluation 05/19/16   Authorization Type medicaid   PT Start Time 1646   PT Stop Time 1725   PT Time Calculation (min) 39 min   Equipment Utilized During Treatment Gait belt   Activity Tolerance Patient tolerated treatment well   Behavior [facility] for tasks assessed/performed      No past medical history on file.  No past surgical history on file.  There were no vitals filed for this visit.      Subjective Assessment - 04/28/16 1642    Subjective Pt reports things are going well. It is getting easier to navigate curbs. Larger curbs are still difficult.  She feels her biggest deficits at this time are leg strength.    Patient is accompained by: Family member   Limitations Walking   Patient Stated Goals Patient wants to improve her core strength.    Currently in Pain? No/denies      Therex:  Ascending / descending steps with 1 loftstrand crutch and 1 rail x 4 steps  Ascending/ descending simulated curb with B loft strand crutches x3  Cues for safe technique. Difficulty due to lack of ankle mobility.  Side stepping in parallel bars with YTB x 10 feet x 5   Standing hip SLR with YTB, abd, extension without resistance 2x10 BLE, difficulty with YTB for abd   Wall squats with theraball 2x10 ,cues for lower squat Quadriped hip ext x10 each Quadriped alt UE/LE flex/ext x10 each  Seated theraball  Marching x10 each  Alt UE/LE flex/ext x10 each    Cues for proper technique of exercises to target appropriate muscles and slow eccentric contractions. Cues for  core contraction for increased stability.                            PT Education - 04/28/16 1700    Education provided Yes   Education Details navigating curbs, steps with B loft strand crutches   Person(s) Educated Patient   Methods Explanation;Demonstration   Comprehension Verbalized understanding;Returned demonstration             PT Long Term Goals - 02/25/16 1540    PT LONG TERM GOAL #1   Title Patient will improve Dynamic Gait Index (DGI) score to > 19/24 for low falls risk regarding dynamic walking tasks    Time 12   Period Weeks   Status On-going   PT LONG TERM GOAL #2   Title Patient will increase Berg Balance score by > 6 points to demonstrate decreased fall risk during functional activities    Time 12   Period Weeks   Status On-going   PT LONG TERM GOAL #3   Title Patient will be able to transfer in and out of a large car with Patient will be able to transfer in and out of a high seated car with CGA    Time 12   Period Weeks   Status On-going   PT LONG TERM GOAL #4   Title Patient will  complete a TUG test in < 12 seconds for independent mobility and decreased fall risk    Time 12   Period Weeks   Status On-going   PT LONG TERM GOAL #5   Time 12   Status On-going   PT LONG TERM GOAL #6   Time 12   Status On-going   PT LONG TERM GOAL #8   Time 12   Period Weeks   Status On-going   PT LONG TERM GOAL  #9   Time 12   Period Weeks   Status On-going   PT LONG TERM GOAL  #10   Time 12   Period Weeks   Status On-going               Plan - 04/28/16 1715    Clinical Impression Statement Pt able to tolerated increased intensity of core strengthening this session. She continues to have difficulty with hip exercises due to weakness. Plan to progress strengthening as tolerated.   Rehab Potential Good   Clinical Impairments Affecting Rehab Potential weakness and decreased standing balance   PT Frequency 1x / week   PT Duration  12 weeks   PT Treatment/Interventions Therapeutic exercise;Therapeutic activities;Gait training;Balance training;Stair training;DME Instruction;Neuromuscular re-education;Patient/family education   PT Next Visit Plan Continue with core and LE strengthening and TM ambulation   PT Home Exercise Plan Continued from previous sessions.    Consulted and Agree with Plan of Care Patient      Patient will benefit from skilled therapeutic intervention in order to improve the following deficits and impairments:  Abnormal gait, Decreased balance, Decreased endurance, Difficulty walking, Decreased strength  Visit Diagnosis: Muscle weakness (generalized)  Other lack of coordination  Difficulty in walking, not elsewhere classified     Problem List There are no active problems to display for this patient.   Dugan Vanhoesen Lucillie Garfinkel, PT, DPT  04/28/2016, 5:27 PM [phone]  [facility] MAIN [facility] SERVICES [address] [place], [place], [address] Phone: [phone]   Fax:  [phone]  Name: Michelle Randall MRN: [id number] Date of Birth: 01/23/1996

## 2016-05-03 ENCOUNTER — Ambulatory Visit: Payer: Medicaid Other | Admitting: Occupational Therapy

## 2016-05-03 ENCOUNTER — Encounter: Payer: Medicaid Other | Admitting: Occupational Therapy

## 2016-05-03 DIAGNOSIS — M6281 Muscle weakness (generalized): Secondary | ICD-10-CM | POA: Diagnosis not present

## 2016-05-03 DIAGNOSIS — R278 Other lack of coordination: Secondary | ICD-10-CM

## 2016-05-04 ENCOUNTER — Encounter: Payer: Self-pay | Admitting: Occupational Therapy

## 2016-05-04 NOTE — Therapy (Signed)
[place] MAIN [facility] SERVICES [address] [place], [place], [address] Phone: [phone]   Fax:  [phone]  Occupational Therapy Treatment  Patient Details  Name: Michelle Randall MRN: [id number] Date of Birth: April 18, 1996 No Data Recorded  Encounter Date: 05/03/2016      OT End of Session - 05/03/16 2105    Visit Number 23   Number of Visits 70   Date for OT Re-Evaluation 07/09/16   Authorization Type medicaid visit 2 of 33   OT Start Time 1601   OT Stop Time 1655   OT Time Calculation (min) 54 min   Activity Tolerance Patient tolerated treatment well   Behavior During Therapy [facility] for tasks assessed/performed      History reviewed. No pertinent past medical history.  History reviewed. No pertinent past surgical history.  There were no vitals filed for this visit.      Subjective Assessment - 05/03/16 2059    Subjective  Patient reports she is doing well, has been taking it slow and easy since graduation.     Patient is accompained by: Family member   Patient Stated Goals wants to be independent with all tasks so she will be able to go away to college.   Currently in Pain? No/denies   Multiple Pain Sites No                      OT Treatments/Exercises (OP) - 05/03/16 2100    ADLs   Writing Patient seen for note taking for college level classes, patient able to complete 3 trials of handwriting/notetaking, 1st trial 3 minute, 5 sec before hand fatigue, 2nd trial 4 minutes 13 secs, 3rd trial 4 minutes 18 secs with 1 minute rest break between each set.  Typed same information in 8 minutes 27 secs this date with error correction and no rest breaks.    Fine Motor Coordination   Other Fine Motor Exercises Patient seen for manipulation of small objects from [employer] pegboard with right hand, small dowel, collar and washer to assemble.  Removed piece by piece with right hand and used translatory skills of the hand with cues  as well as using the hand for storage.    Neurological Re-education Exercises   Other Exercises 1 Patient seen for strengthening tasks with 2# weights for shoulder flexion, chest press, elbow flexion ext for 2 sets of 10 reps each.                 OT Education - 05/03/16 2104    Education provided Yes   Education Details [place], notetaking   Person(s) Educated Patient   Methods Explanation;Demonstration;Verbal cues   Comprehension Verbal cues required;Returned demonstration;Verbalized understanding             OT Long Term Goals - 04/14/16 1628    OT LONG TERM GOAL #1   Title Patient will demonstrate methods of picking up her bookbag and placing it onto the back of her chair with modified independence to transport items to class.   Baseline requires assistance from others to complete now but will need to be independent when at college in the fall .   Time 6   Period Months   Status On-going   OT LONG TERM GOAL #2   Title Patient will be able to make her bed with modified independence.   Baseline she continues to have difficulty with balance and completing this task, moderate assist.   Time  6   Period Months   Status On-going   OT LONG TERM GOAL #3   Title Patient will demonstrate the ability to perform vacuuming of one room with modified independence using light weight vacuum.   Baseline still requires max assist   Time 6   Period Months   Status On-going   OT LONG TERM GOAL #4   Title Patient will demonstrate the ability to hang clothes onto hangers to place in the closet with modified independence.   Baseline --   Time 6   Period Months   Status Achieved   OT LONG TERM GOAL #5   Title Patient will demonstrate donning and doffing her bra with modified independence.   Baseline max assist   Time 12   Period Weeks   Status New   OT LONG TERM GOAL #6   Title Will now be able to go through a door that swings out using motorized wheel chair.   Baseline difficulty  depending on type of door and set up of the area, at times requires max assistance depending on the weight of the door.   Time 6   Period Months   Status On-going   OT LONG TERM GOAL #7   Title (Higher level grip) Will be able to squeeze a lemon during cooking.   Baseline --   Time 6   Period Months   Status Achieved   OT LONG TERM GOAL #8   Title Patient will be able to donn night leg brace independently before she goes to college.   Time 6   Period Months   Status Achieved   OT LONG TERM GOAL  #9   Baseline Patient will be able to braid her own hair before college   Time 6   Period Months   Status On-going   OT LONG TERM GOAL  #10   TITLE Wll be able to wash her own hair before starting college   Baseline mom still has to help with more than 20% of task now down from 50%.     Time 6   Period Months   Status On-going   OT LONG TERM GOAL  #11   TITLE Will be able to squeeze tooth paste out of the tube when it is less than half full.   Baseline still requires min assist if tube is less than 1/2 full.   Time 6   Period Months   Status Partially Met   OT LONG TERM GOAL  #12   TITLE Patient will demonstrate managing wheelchair rain poncho in inclement weather with modified independence.   Baseline --   Time 6   Period Months   Status New   OT LONG TERM GOAL  #13   TITLE Patient will be able to complete 2 1/2 minutes straight with left shoulder flexion simulating driving with a stearing wheel ball to be able to drive to college.    Baseline can do 30 second of shoulder flexion with 2 1/2 before fatigue and can only drive 15 min before right shoulder fatigues.   Time 6   Period Months   Status On-going   OT LONG TERM GOAL  #14   TITLE Will be able to open taped boxes (for moving to college) using a key or knife.   Baseline ongoing techniques explored with adaptive devices to assist.   Time 6   Period Months   Status On-going   OT LONG TERM GOAL  #15  TITLE Patient will  be able to obtain food tray in the cafeteria from a wheelchair level and take to the table without spilling with modified independence.    Baseline max assist   Time 6   Period Months   Status New               Plan - 05/03/16 2106    Clinical Impression Statement Patient slow to complete handwriting for notetaking and requires frequent rest breaks and will likely not be able to keep the pace in order to take notes in class at college level.  She is faster with typing notes but will require additional focus to improve speed and accuracy.  Strengthening to work towards [employer] and placing on and off wheelchair.  Will continue to work towards transition to college.    Rehab Potential Good   OT Frequency 2x / week   OT Duration 12 weeks   OT Treatment/Interventions Self-care/ADL training;Therapeutic exercise;Therapeutic exercises;Therapeutic activities   Consulted and Agree with Plan of Care Patient      Patient will benefit from skilled therapeutic intervention in order to improve the following deficits and impairments:  Impaired UE functional use, Difficulty walking, Decreased strength, Decreased coordination, Decreased endurance, Decreased balance  Visit Diagnosis: Muscle weakness (generalized)  Other lack of coordination    Problem List There are no active problems to display for this patient.  Achilles Dunk, OTR/L, CLT   Michelle Randall 05/04/2016, 9:09 PM  [place] MAIN [facility] SERVICES [address] [place], [place], [address] Phone: [phone]   Fax:  [phone]  Name: COLLIN HENDLEY MRN: [id number] Date of Birth: [birth year]

## 2016-05-05 ENCOUNTER — Ambulatory Visit: Payer: Medicaid Other | Admitting: Occupational Therapy

## 2016-05-05 ENCOUNTER — Encounter: Payer: Self-pay | Admitting: Physical Therapy

## 2016-05-05 ENCOUNTER — Ambulatory Visit: Payer: Medicaid Other | Admitting: Physical Therapy

## 2016-05-05 ENCOUNTER — Encounter: Payer: Self-pay | Admitting: Occupational Therapy

## 2016-05-05 DIAGNOSIS — M6281 Muscle weakness (generalized): Secondary | ICD-10-CM

## 2016-05-05 DIAGNOSIS — R278 Other lack of coordination: Secondary | ICD-10-CM

## 2016-05-05 DIAGNOSIS — R262 Difficulty in walking, not elsewhere classified: Secondary | ICD-10-CM

## 2016-05-05 NOTE — Therapy (Signed)
[place] [facility] [address] [place], [place], [address] Phone: [phone]   Fax:  [phone]  Physical Therapy Treatment  Patient Details  Name: Michelle Randall MRN: [id number] Date of Birth: 08/29/1996 No Data Recorded  Encounter Date: 05/05/2016      PT End of Session - 05/05/16 1705    Visit Number 41   Number of Visits 44   Date for PT Re-Evaluation 05/19/16   Authorization Type medicaid   PT Start Time 1645   PT Stop Time 1725   PT Time Calculation (min) 40 min   Equipment Utilized During Treatment Gait belt   Activity Tolerance Patient tolerated treatment well   Behavior During Therapy [facility] for tasks assessed/performed      History reviewed. No pertinent past medical history.  History reviewed. No pertinent past surgical history.  There were no vitals filed for this visit.      Subjective Assessment - 05/05/16 1704    Subjective Pt reports things are going well. It is getting easier to navigate curbs. Larger curbs are still difficult.  She feels her biggest deficits at this time are leg strength.    Patient is accompained by: Family member   Limitations Walking   Patient Stated Goals Patient wants to improve her core strength.       Therapeutic exercise: Ascending/ descending steps x 12 with CGA Ascending / descending curbs x 3 with CGA  Side stepping in parallel bars left and right x 10 feet x 5 Step ups x 20 6 inch stool Eccentric step downs x 10 BLE   Patient needs occasional verbal cueing to improve posture and cueing to correctly perform exercises slowly, holding at end of range to increase motor firing of desired muscle to encourage fatigue.                            PT Education - 05/05/16 1704    Education provided Yes   Education Details safety with curbs   Person(s) Educated Patient   Methods Explanation   Comprehension Verbalized understanding              PT Long Term Goals - 02/25/16 1540    PT LONG TERM GOAL #1   Title Patient will improve Dynamic Gait Index (DGI) score to > 19/24 for low falls risk regarding dynamic walking tasks    Time 12   Period Weeks   Status On-going   PT LONG TERM GOAL #2   Title Patient will increase Berg Balance score by > 6 points to demonstrate decreased fall risk during functional activities    Time 12   Period Weeks   Status On-going   PT LONG TERM GOAL #3   Title Patient will be able to transfer in and out of a large car with Patient will be able to transfer in and out of a high seated car with CGA    Time 12   Period Weeks   Status On-going   PT LONG TERM GOAL #4   Title Patient will complete a TUG test in < 12 seconds for independent mobility and decreased fall risk    Time 12   Period Weeks   Status On-going   PT LONG TERM GOAL #5   Time 12   Status On-going   PT LONG TERM GOAL #6   Time 12   Status On-going   PT LONG TERM  GOAL #8   Time 12   Period Weeks   Status On-going   PT LONG TERM GOAL  #9   Time 12   Period Weeks   Status On-going   PT LONG TERM GOAL  #10   Time 12   Period Weeks   Status On-going               Plan - 05/05/16 1707    Clinical Impression Statement Patient has weakness in BLE and decreased mobility and gait on curbs and steps.    Rehab Potential Good   Clinical Impairments Affecting Rehab Potential weakness and decreased standing balance   PT Frequency 1x / week   PT Duration 12 weeks   PT Treatment/Interventions Therapeutic exercise;Therapeutic activities;Gait training;Balance training;Stair training;DME Instruction;Neuromuscular re-education;Patient/family education   PT Next Visit Plan Continue with core and LE strengthening and TM ambulation   PT Home Exercise Plan Continued from previous sessions.    Consulted and Agree with Plan of Care Patient      Patient will benefit from skilled therapeutic intervention in order to improve the  following deficits and impairments:  Abnormal gait, Decreased balance, Decreased endurance, Difficulty walking, Decreased strength  Visit Diagnosis: Muscle weakness (generalized)  Difficulty in walking, not elsewhere classified     Problem List There are no active problems to display for this patient.   [name] 05/05/2016, 5:18 PM  [place] [facility] MAIN [facility] SERVICES [address] [place], [place], [address] Phone: [phone]   Fax:  [phone]  Name: Michelle Randall MRN: [id number] Date of Birth: 1995/12/03

## 2016-05-07 NOTE — Therapy (Signed)
[place] MAIN [facility] SERVICES [address] [place], [place], [address] Phone: [phone]   Fax:  [phone]  Occupational Therapy Treatment  Patient Details  Name: Michelle Randall MRN: [id number] Date of Birth: 1996/04/19 No Data Recorded  Encounter Date: 05/05/2016      OT End of Session - 05/06/16 1428    Visit Number 24   Number of Visits 23   Date for OT Re-Evaluation 07/09/16   Authorization Type medicaid visit 3 of 33   OT Start Time 1600   OT Stop Time 1645   OT Time Calculation (min) 45 min   Activity Tolerance Patient tolerated treatment well   Behavior During Therapy [facility] for tasks assessed/performed      History reviewed. No pertinent past medical history.  History reviewed. No pertinent past surgical history.  There were no vitals filed for this visit.      Subjective Assessment - 05/06/16 1428    Subjective  Patient reports during the week of July 4th, she will be shadowing a speech therapist, her sister set it up for her.    Patient Stated Goals wants to be independent with all tasks so she will be able to go away to college.                      OT Treatments/Exercises (OP) - 05/06/16 1434    ADLs   ADL Comments Patient seen this date for focus on school tasks with transition to college in the fall.  Engaged in use of tablet this date to take notes using a touch screen keyboard for multiple trials of text.  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  Cues provided for efficiency.     Fine Motor Coordination   Other Fine Motor Exercises Patient seen for manipulation of small objects from [employer] pegboard with right hand, small dowel, collar and washer to assemble.  Removed piece by piece with right hand and used translatory skills of the hand with cues as well as using the hand for  storage.                 OT Education - 05/06/16 1428    Education provided Yes             OT Long Term Goals - 04/14/16 1628    OT LONG TERM GOAL #1   Title Patient will demonstrate methods of picking up her bookbag and placing it onto the back of her chair with modified independence to transport items to class.   Baseline requires assistance from others to complete now but will need to be independent when at college in the fall .   Time 6   Period Months   Status On-going   OT LONG TERM GOAL #2   Title Patient will be able to make her bed with modified independence.   Baseline she continues to have difficulty with balance and completing this task, moderate assist.   Time 6   Period Months   Status On-going   OT LONG TERM GOAL #3   Title Patient will demonstrate the ability to perform vacuuming of one room with modified independence using light weight vacuum.   Baseline still requires max assist   Time 6   Period Months   Status On-going   OT LONG TERM GOAL #4  Title Patient will demonstrate the ability to hang clothes onto hangers to place in the closet with modified independence.   Baseline --   Time 6   Period Months   Status Achieved   OT LONG TERM GOAL #5   Title Patient will demonstrate donning and doffing her bra with modified independence.   Baseline max assist   Time 12   Period Weeks   Status New   OT LONG TERM GOAL #6   Title Will now be able to go through a door that swings out using motorized wheel chair.   Baseline difficulty depending on type of door and set up of the area, at times requires max assistance depending on the weight of the door.   Time 6   Period Months   Status On-going   OT LONG TERM GOAL #7   Title (Higher level grip) Will be able to squeeze a lemon during cooking.   Baseline --   Time 6   Period Months   Status Achieved   OT LONG TERM GOAL #8   Title Patient will be able to donn night leg brace independently before  she goes to college.   Time 6   Period Months   Status Achieved   OT LONG TERM GOAL  #9   Baseline Patient will be able to braid her own hair before college   Time 6   Period Months   Status On-going   OT LONG TERM GOAL  #10   TITLE Wll be able to wash her own hair before starting college   Baseline mom still has to help with more than 20% of task now down from 50%.     Time 6   Period Months   Status On-going   OT LONG TERM GOAL  #11   TITLE Will be able to squeeze tooth paste out of the tube when it is less than half full.   Baseline still requires min assist if tube is less than 1/2 full.   Time 6   Period Months   Status Partially Met   OT LONG TERM GOAL  #12   TITLE Patient will demonstrate managing wheelchair rain poncho in inclement weather with modified independence.   Baseline --   Time 6   Period Months   Status New   OT LONG TERM GOAL  #13   TITLE Patient will be able to complete 2 1/2 minutes straight with left shoulder flexion simulating driving with a stearing wheel ball to be able to drive to college.    Baseline can do 30 second of shoulder flexion with 2 1/2 before fatigue and can only drive 15 min before right shoulder fatigues.   Time 6   Period Months   Status On-going   OT LONG TERM GOAL  #14   TITLE Will be able to open taped boxes (for moving to college) using a key or knife.   Baseline ongoing techniques explored with adaptive devices to assist.   Time 6   Period Months   Status On-going   OT LONG TERM GOAL  #15   TITLE Patient will be able to obtain food tray in the cafeteria from a wheelchair level and take to the table without spilling with modified independence.    Baseline max assist   Time 6   Period Months   Status New               Plan - 05/06/16 1429  Clinical Impression Statement Patient worked towards use of tablet this date to type notes for class using touch screen method, will attempt to use detachable keyboard next  session to see the difference between the two.  Patient continues to work towards strengthening tasks to be able to manage all tasks required to attend college independently in the fall. She continues to benefit from skilled OT to increase independence in daily tasks.   Rehab Potential Good   OT Frequency 2x / week   OT Duration 12 weeks   OT Treatment/Interventions Self-care/ADL training;Therapeutic exercise;Therapeutic exercises;Therapeutic activities   Consulted and Agree with Plan of Care Patient      Patient will benefit from skilled therapeutic intervention in order to improve the following deficits and impairments:  Impaired UE functional use, Difficulty walking, Decreased strength, Decreased coordination, Decreased endurance, Decreased balance  Visit Diagnosis: Muscle weakness (generalized)  Other lack of coordination    Problem List There are no active problems to display for this patient.  Achilles Dunk, OTR/L, CLT  Lovett,Amy 05/07/2016, 2:37 PM  [place] MAIN [facility] SERVICES [address] [place], [place], [address] Phone: [phone]   Fax:  [phone]  Name: Michelle Randall MRN: [id number] Date of Birth: 17-Jan-1996

## 2016-05-10 ENCOUNTER — Encounter: Payer: Self-pay | Admitting: Occupational Therapy

## 2016-05-10 ENCOUNTER — Encounter: Payer: Medicaid Other | Admitting: Occupational Therapy

## 2016-05-10 ENCOUNTER — Ambulatory Visit: Payer: Medicaid Other | Admitting: Occupational Therapy

## 2016-05-10 DIAGNOSIS — M6281 Muscle weakness (generalized): Secondary | ICD-10-CM | POA: Diagnosis not present

## 2016-05-10 DIAGNOSIS — R278 Other lack of coordination: Secondary | ICD-10-CM

## 2016-05-11 NOTE — Therapy (Signed)
[place] MAIN [facility] SERVICES [address] [place], [place], [address] Phone: [phone]   Fax:  [phone]  Occupational Therapy Treatment  Patient Details  Name: Michelle Randall MRN: [id number] Date of Birth: [birth year] No Data Recorded  Encounter Date: 05/10/2016      OT End of Session - 05/10/16 1629    Visit Number 25   Number of Visits 70   Date for OT Re-Evaluation 07/09/16   Authorization Type medicaid visit 4 of 68   OT Start Time 1602   OT Stop Time 1649   OT Time Calculation (min) 47 min   Activity Tolerance Patient tolerated treatment well   Behavior During Therapy [facility] for tasks assessed/performed      History reviewed. No pertinent past medical history.  History reviewed. No pertinent past surgical history.  There were no vitals filed for this visit.      Subjective Assessment - 05/10/16 1627    Subjective  Patient reports fasting is over now, is having a good week.  Hoping to go to [employer] towards the end of the month.     Patient Stated Goals wants to be independent with all tasks so she will be able to go away to college.   Currently in Pain? No/denies   Multiple Pain Sites No                      OT Treatments/Exercises (OP) - 05/10/16 1646    ADLs   ADL Comments Patient seen this date for typing for speed and dexterity to complete note taking in class, 10 minute trial with error correction.     Fine Motor Coordination   Other Fine Motor Exercises Patient seen for manipulation of small objects from [employer] pegboard with right hand, small dowel, collar and washer to assemble.  Removed piece by piece with right hand and used translatory skills of the hand with cues as well as using the hand for storage.    Neurological Re-education Exercises   Other Exercises 1 Patient seen for bilateral wrist strengthening task of 2# wrist flexion/extension, ulnar and radial deviation, 10 reps for 2-3 sets  each.  Green theraputty exercises for grip strength, lateral pinch, 2 point pinch and 3 point pinch for 10 reps for 1 set for each hand.                 OT Education - 05/11/16 1648    Education provided Yes   Education Details green theraputty exercises, wrist strengthening exercise.    Person(s) Educated Patient   Methods Explanation;Demonstration;Verbal cues   Comprehension Verbal cues required;Returned demonstration;Verbalized understanding             OT Long Term Goals - 04/14/16 1628    OT LONG TERM GOAL #1   Title Patient will demonstrate methods of picking up her bookbag and placing it onto the back of her chair with modified independence to transport items to class.   Baseline requires assistance from others to complete now but will need to be independent when at college in the fall .   Time 6   Period Months   Status On-going   OT LONG TERM GOAL #2   Title Patient will be able to make her bed with modified independence.   Baseline she continues to have difficulty with balance and completing this task, moderate assist.   Time 6   Period Months   Status On-going  OT LONG TERM GOAL #3   Title Patient will demonstrate the ability to perform vacuuming of one room with modified independence using light weight vacuum.   Baseline still requires max assist   Time 6   Period Months   Status On-going   OT LONG TERM GOAL #4   Title Patient will demonstrate the ability to hang clothes onto hangers to place in the closet with modified independence.   Baseline --   Time 6   Period Months   Status Achieved   OT LONG TERM GOAL #5   Title Patient will demonstrate donning and doffing her bra with modified independence.   Baseline max assist   Time 12   Period Weeks   Status New   OT LONG TERM GOAL #6   Title Will now be able to go through a door that swings out using motorized wheel chair.   Baseline difficulty depending on type of door and set up of the area, at  times requires max assistance depending on the weight of the door.   Time 6   Period Months   Status On-going   OT LONG TERM GOAL #7   Title (Higher level grip) Will be able to squeeze a lemon during cooking.   Baseline --   Time 6   Period Months   Status Achieved   OT LONG TERM GOAL #8   Title Patient will be able to donn night leg brace independently before she goes to college.   Time 6   Period Months   Status Achieved   OT LONG TERM GOAL  #9   Baseline Patient will be able to braid her own hair before college   Time 6   Period Months   Status On-going   OT LONG TERM GOAL  #10   TITLE Wll be able to wash her own hair before starting college   Baseline mom still has to help with more than 20% of task now down from 50%.     Time 6   Period Months   Status On-going   OT LONG TERM GOAL  #11   TITLE Will be able to squeeze tooth paste out of the tube when it is less than half full.   Baseline still requires min assist if tube is less than 1/2 full.   Time 6   Period Months   Status Partially Met   OT LONG TERM GOAL  #12   TITLE Patient will demonstrate managing wheelchair rain poncho in inclement weather with modified independence.   Baseline --   Time 6   Period Months   Status New   OT LONG TERM GOAL  #13   TITLE Patient will be able to complete 2 1/2 minutes straight with left shoulder flexion simulating driving with a stearing wheel ball to be able to drive to college.    Baseline can do 30 second of shoulder flexion with 2 1/2 before fatigue and can only drive 15 min before right shoulder fatigues.   Time 6   Period Months   Status On-going   OT LONG TERM GOAL  #14   TITLE Will be able to open taped boxes (for moving to college) using a key or knife.   Baseline ongoing techniques explored with adaptive devices to assist.   Time 6   Period Months   Status On-going   OT LONG TERM GOAL  #15   TITLE Patient will be able to obtain food tray in the  cafeteria from a  wheelchair level and take to the table without spilling with modified independence.    Baseline max assist   Time 6   Period Months   Status New               Plan - 05/10/16 1629    Clinical Impression Statement Patient to upgrade to green theraputty exercises than red at home, issued green this date.  Patient has a 2# weight for ankles at home, she is going to try wrist exercises holding 2# weight at home.  She will need to further increase strength in shoulder, elbow and wrist to be able to pick up and retrieve bookbag from back of wheelchair and put it back at the end of class.  She is considering a MAC book pro which is 4.5# but in a book bag with other necessary items may exceed the weight she can lift currently.  She is now able to manage 7# at this time.    Rehab Potential Good   OT Frequency 2x / week   OT Duration 12 weeks   OT Treatment/Interventions Self-care/ADL training;Therapeutic exercise;Therapeutic exercises;Therapeutic activities   Consulted and Agree with Plan of Care Patient      Patient will benefit from skilled therapeutic intervention in order to improve the following deficits and impairments:  Impaired UE functional use, Difficulty walking, Decreased strength, Decreased coordination, Decreased endurance, Decreased balance  Visit Diagnosis: Muscle weakness (generalized)  Other lack of coordination    Problem List There are no active problems to display for this patient.  Achilles Dunk, OTR/L, CLT  Aryah Doering 05/11/2016, 4:57 PM  [place] MAIN [facility] SERVICES [address] [place], [place], [address] Phone: [phone]   Fax:  [phone]  Name: Michelle Randall MRN: [id number] Date of Birth: [birth year]

## 2016-05-12 ENCOUNTER — Ambulatory Visit: Payer: Medicaid Other | Admitting: Physical Therapy

## 2016-05-12 ENCOUNTER — Ambulatory Visit: Payer: Medicaid Other | Admitting: Occupational Therapy

## 2016-05-12 DIAGNOSIS — R262 Difficulty in walking, not elsewhere classified: Secondary | ICD-10-CM

## 2016-05-12 DIAGNOSIS — M6281 Muscle weakness (generalized): Secondary | ICD-10-CM

## 2016-05-12 DIAGNOSIS — R278 Other lack of coordination: Secondary | ICD-10-CM

## 2016-05-12 NOTE — Therapy (Signed)
[place] [facility] [address] [place], [place], [address] Phone: [phone]   Fax:  [phone]  Physical Therapy Treatment  Patient Details  Name: Michelle Randall MRN: [id number] Date of Birth: 08-May-1996 No Data Recorded  Encounter Date: 05/12/2016      PT End of Session - 05/12/16 1713    Visit Number 42   Number of Visits 44   Date for PT Re-Evaluation 05/19/16   Authorization Type medicaid   PT Start Time 1645   PT Stop Time 1725   PT Time Calculation (min) 40 min   Equipment Utilized During Treatment Gait belt   Activity Tolerance Patient tolerated treatment well   Behavior During Therapy [facility] for tasks assessed/performed      No past medical history on file.  No past surgical history on file.  There were no vitals filed for this visit.      Subjective Assessment - 05/12/16 1709    Subjective Pt reports things are going well. It is getting easier to navigate curbs. Larger curbs are still difficult.  She feels her biggest deficits at this time are leg strength.    Patient is accompained by: Family member   Limitations Walking   Patient Stated Goals Patient wants to improve her core strength.    Currently in Pain? No/denies     Therapeutic exercise; Leg press x 100 lbs x 20 x 2 Ascending / descending steps with 1 railing and CGA Curbs and 1 step up with UE support x 5 Side stepping with YTB 10 feet  X 4  Hip abd with trunk extension and hip flex with trunk extension x 10  TM walking x 10 minutes CGA and Min to mod verbal cues used throughout with increased in postural sway and LOB most seen with narrow base of support and while on uneven surfaces. Continues to have balance deficits typical with diagnosis. Patient performs intermediate level exercises without pain behaviors and needs verbal cuing for postural alignment and head positioning.                            PT Education -  05/12/16 1710    Education provided Yes   Education Details Safety with curbs and steps   Person(s) Educated Patient   Methods Explanation   Comprehension Verbalized understanding             PT Long Term Goals - 02/25/16 1540    PT LONG TERM GOAL #1   Title Patient will improve Dynamic Gait Index (DGI) score to > 19/24 for low falls risk regarding dynamic walking tasks    Time 12   Period Weeks   Status On-going   PT LONG TERM GOAL #2   Title Patient will increase Berg Balance score by > 6 points to demonstrate decreased fall risk during functional activities    Time 12   Period Weeks   Status On-going   PT LONG TERM GOAL #3   Title Patient will be able to transfer in and out of a large car with Patient will be able to transfer in and out of a high seated car with CGA    Time 12   Period Weeks   Status On-going   PT LONG TERM GOAL #4   Title Patient will complete a TUG test in < 12 seconds for independent mobility and decreased fall risk    Time 12  Period Weeks   Status On-going   PT LONG TERM GOAL #5   Time 12   Status On-going   PT LONG TERM GOAL #6   Time 12   Status On-going   PT LONG TERM GOAL #8   Time 12   Period Weeks   Status On-going   PT LONG TERM GOAL  #9   Time 12   Period Weeks   Status On-going   PT LONG TERM GOAL  #10   Time 12   Period Weeks   Status On-going               Plan - 05/12/16 1719    Clinical Impression Statement Patient has trunk weakness and has difficulty standing up straight to perform exercises. Patient tolerates exercises without a rest period and has no reports of pain,   Rehab Potential Good   Clinical Impairments Affecting Rehab Potential weakness and decreased standing balance   PT Frequency 1x / week   PT Duration 12 weeks   PT Treatment/Interventions Therapeutic exercise;Therapeutic activities;Gait training;Balance training;Stair training;DME Instruction;Neuromuscular re-education;Patient/family  education   PT Next Visit Plan Continue with core and LE strengthening and TM ambulation   PT Home Exercise Plan Continued from previous sessions.    Consulted and Agree with Plan of Care Patient      Patient will benefit from skilled therapeutic intervention in order to improve the following deficits and impairments:  Abnormal gait, Decreased balance, Decreased endurance, Difficulty walking, Decreased strength  Visit Diagnosis: Muscle weakness (generalized)  Difficulty in walking, not elsewhere classified     Problem List There are no active problems to display for this patient.  [name], PT, DPT [place], [place] 05/12/2016, 5:32 PM  [place] [facility] [address] [place], [place], [address] Phone: [phone]   Fax:  [phone]  Name: Michelle Randall MRN: [id number] Date of Birth: February 10, 1996

## 2016-05-14 NOTE — Therapy (Signed)
[place] MAIN [facility] SERVICES [address] [place], [place], [address] Phone: [phone]   Fax:  [phone]  Occupational Therapy Treatment  Patient Details  Name: Michelle Randall MRN: [id number] Date of Birth: 1996-07-26 No Data Recorded  Encounter Date: 05/12/2016      OT End of Session - 05/14/16 1728    Visit Number 26   Number of Visits 70   Date for OT Re-Evaluation 07/09/16   Authorization Type medicaid visit 5 of 79   OT Start Time 1604   OT Stop Time 1645   OT Time Calculation (min) 41 min   Activity Tolerance Patient tolerated treatment well   Behavior During Therapy [facility] for tasks assessed/performed      No past medical history on file.  No past surgical history on file.  There were no vitals filed for this visit.      Subjective Assessment - 05/13/16 1723    Subjective  Patient reports she has not been doing much over the summer yet.  Staying at home a lot.    Patient is accompained by: Family member   Patient Stated Goals wants to be independent with all tasks so she will be able to go away to college.   Currently in Pain? No/denies   Multiple Pain Sites No                      OT Treatments/Exercises (OP) - 05/14/16 1723    ADLs   ADL Comments Patient seen for note taking for school on ipad using touch screen for 10 minutes and compared to same passage from previous session for typing.  Patient is faster with typing on computer in comparison.     Fine Motor Coordination   Other Fine Motor Exercises Patient seen for manipulation of small 1/2 inch objects placing into grid and then removing with use of tweezers for hand control and coordination skills with cues.   Neurological Re-education Exercises   Other Exercises 1 Patient seen for bilateral wrist strengthening task of 2# wrist flexion/extension, ulnar and radial deviation, 15 reps for 2-3 sets each.  1# weight for shoulder flexion, ABD, chest  press, elbow flexion/extension, supination/pronation for 10 reps for 2 sets each/Cues provided for technique of exercises.                 OT Education - 05/14/16 1727    Education provided Yes   Education Details HEP, coordination, strengthening   Person(s) Educated Patient   Methods Explanation;Demonstration;Verbal cues   Comprehension Verbal cues required;Returned demonstration;Verbalized understanding             OT Long Term Goals - 04/14/16 1628    OT LONG TERM GOAL #1   Title Patient will demonstrate methods of picking up her bookbag and placing it onto the back of her chair with modified independence to transport items to class.   Baseline requires assistance from others to complete now but will need to be independent when at college in the fall .   Time 6   Period Months   Status On-going   OT LONG TERM GOAL #2   Title Patient will be able to make her bed with modified independence.   Baseline she continues to have difficulty with balance and completing this task, moderate assist.   Time 6   Period Months   Status On-going   OT LONG TERM GOAL #3   Title Patient will demonstrate the  ability to perform vacuuming of one room with modified independence using light weight vacuum.   Baseline still requires max assist   Time 6   Period Months   Status On-going   OT LONG TERM GOAL #4   Title Patient will demonstrate the ability to hang clothes onto hangers to place in the closet with modified independence.   Baseline --   Time 6   Period Months   Status Achieved   OT LONG TERM GOAL #5   Title Patient will demonstrate donning and doffing her bra with modified independence.   Baseline max assist   Time 12   Period Weeks   Status New   OT LONG TERM GOAL #6   Title Will now be able to go through a door that swings out using motorized wheel chair.   Baseline difficulty depending on type of door and set up of the area, at times requires max assistance depending  on the weight of the door.   Time 6   Period Months   Status On-going   OT LONG TERM GOAL #7   Title (Higher level grip) Will be able to squeeze a lemon during cooking.   Baseline --   Time 6   Period Months   Status Achieved   OT LONG TERM GOAL #8   Title Patient will be able to donn night leg brace independently before she goes to college.   Time 6   Period Months   Status Achieved   OT LONG TERM GOAL  #9   Baseline Patient will be able to braid her own hair before college   Time 6   Period Months   Status On-going   OT LONG TERM GOAL  #10   TITLE Wll be able to wash her own hair before starting college   Baseline mom still has to help with more than 20% of task now down from 50%.     Time 6   Period Months   Status On-going   OT LONG TERM GOAL  #11   TITLE Will be able to squeeze tooth paste out of the tube when it is less than half full.   Baseline still requires min assist if tube is less than 1/2 full.   Time 6   Period Months   Status Partially Met   OT LONG TERM GOAL  #12   TITLE Patient will demonstrate managing wheelchair rain poncho in inclement weather with modified independence.   Baseline --   Time 6   Period Months   Status New   OT LONG TERM GOAL  #13   TITLE Patient will be able to complete 2 1/2 minutes straight with left shoulder flexion simulating driving with a stearing wheel ball to be able to drive to college.    Baseline can do 30 second of shoulder flexion with 2 1/2 before fatigue and can only drive 15 min before right shoulder fatigues.   Time 6   Period Months   Status On-going   OT LONG TERM GOAL  #14   TITLE Will be able to open taped boxes (for moving to college) using a key or knife.   Baseline ongoing techniques explored with adaptive devices to assist.   Time 6   Period Months   Status On-going   OT LONG TERM GOAL  #15   TITLE Patient will be able to obtain food tray in the cafeteria from a wheelchair level and take to the table  without  spilling with modified independence.    Baseline max assist   Time 6   Period Months   Status New               Plan - 05/14/16 1728    Clinical Impression Statement Patient continues to work towards improved strength to manage bookbag, lifting computer and school supplies.  When comparing typing on computer versus ipad tablet, she is more efficient and faster with computer.    Rehab Potential Good   OT Frequency 2x / week   OT Duration 12 weeks   OT Treatment/Interventions Self-care/ADL training;Therapeutic exercise;Therapeutic exercises;Therapeutic activities   Consulted and Agree with Plan of Care Patient      Patient will benefit from skilled therapeutic intervention in order to improve the following deficits and impairments:  Impaired UE functional use, Difficulty walking, Decreased strength, Decreased coordination, Decreased endurance, Decreased balance  Visit Diagnosis: Muscle weakness (generalized)  Other lack of coordination    Problem List There are no active problems to display for this patient.  Achilles Dunk, OTR/L, CLT  Michelle Randall 05/14/2016, 5:30 PM  [place] MAIN [facility] SERVICES [place], [place], [address] Phone: [phone]   Fax:  [phone]  Name: Michelle Randall MRN: [id number] Date of Birth: Sep 13, 1996

## 2016-05-17 ENCOUNTER — Encounter: Payer: Medicaid Other | Admitting: Occupational Therapy

## 2016-05-17 ENCOUNTER — Ambulatory Visit: Payer: Medicaid Other | Attending: Pediatrics | Admitting: Occupational Therapy

## 2016-05-17 ENCOUNTER — Ambulatory Visit: Payer: Medicaid Other | Admitting: Physical Therapy

## 2016-05-17 ENCOUNTER — Encounter: Payer: Self-pay | Admitting: Physical Therapy

## 2016-05-17 DIAGNOSIS — M6281 Muscle weakness (generalized): Secondary | ICD-10-CM | POA: Diagnosis present

## 2016-05-17 DIAGNOSIS — R262 Difficulty in walking, not elsewhere classified: Secondary | ICD-10-CM

## 2016-05-17 DIAGNOSIS — R278 Other lack of coordination: Secondary | ICD-10-CM

## 2016-05-17 DIAGNOSIS — R279 Unspecified lack of coordination: Secondary | ICD-10-CM | POA: Insufficient documentation

## 2016-05-17 DIAGNOSIS — R460 Very low level of personal hygiene: Secondary | ICD-10-CM | POA: Diagnosis present

## 2016-05-17 NOTE — Therapy (Addendum)
[facility] MAIN [facility] [address] [place], [place], [address] Phone: [phone]   Fax:  [phone]  Physical Therapy Treatment  Patient Details  Name: Michelle Randall MRN: [id number] Date of Birth: [birth year] No Data Recorded  Encounter Date: 05/17/2016      PT End of Session - 05/17/16 1550    Visit Number 43   Number of Visits 44   Date for PT Re-Evaluation 05/19/16   Authorization Type medicaid   PT Start Time 1515   PT Stop Time 1600   PT Time Calculation (min) 45 min   Equipment Utilized During Treatment Gait belt   Activity Tolerance Patient tolerated treatment well   Behavior During Therapy [facility] for tasks assessed/performed      History reviewed. No pertinent past medical history.  History reviewed. No pertinent past surgical history.  There were no vitals filed for this visit.      Subjective Assessment - 05/17/16 1550    Subjective Pt reports things are going well. It is getting easier to navigate curbs. Larger curbs are still difficult.  She feels her biggest deficits at this time are leg strength.    Patient is accompained by: Family member   Limitations Walking   Patient Stated Goals Patient wants to improve her core strength.        Therapeutic exercise:   side stepping left and right with YTB x 10 x 5 laps in parallel bars Step ups to 6 inch stool Ascending and descending stairs x 4 x 3 sets Leg press 100 lbs x 20 x 3 TM walking x 10 mins with elevation 1 Patient needs occasional verbal cueing to improve posture and cueing to correctly perform exercises slowly, holding at end of range to increase motor firing of desired muscle to encourage fatigue.                         PT Education - 05/17/16 1550    Education provided Yes   Education Details HEP   Person(s) Educated Patient   Methods Explanation   Comprehension Verbalized understanding             PT Long Term  Goals - 02/25/16 1540    PT LONG TERM GOAL #1   Title Patient will improve Dynamic Gait Index (DGI) score to > 19/24 for low falls risk regarding dynamic walking tasks    Time 12   Period Weeks   Status On-going   PT LONG TERM GOAL #2   Title Patient will increase Berg Balance score by > 6 points to demonstrate decreased fall risk during functional activities    Time 12   Period Weeks   Status On-going   PT LONG TERM GOAL #3   Title Patient will be able to transfer in and out of a large car with Patient will be able to transfer in and out of a high seated car with CGA    Time 12   Period Weeks   Status On-going   PT LONG TERM GOAL #4   Title Patient will complete a TUG test in < 12 seconds for independent mobility and decreased fall risk    Time 12   Period Weeks   Status On-going   PT LONG TERM GOAL #5   Time 12   Status On-going   PT LONG TERM GOAL #6   Time 12   Status On-going   PT LONG TERM  GOAL #8   Time 12   Period Weeks   Status On-going   PT LONG TERM GOAL  #9   Time 12   Period Weeks   Status On-going   PT LONG TERM GOAL  #10   Time 12   Period Weeks   Status On-going               Plan - 05/17/16 1552    Clinical Impression Statement PT provided min  verbal instruction to improve set up, proper use of LE, and improved posture and gait mechanics. Patient responded moderately to instruction   Rehab Potential Good   Clinical Impairments Affecting Rehab Potential weakness and decreased standing balance   PT Frequency 1x / week   PT Duration 12 weeks   PT Treatment/Interventions Therapeutic exercise;Therapeutic activities;Gait training;Balance training;Stair training;DME Instruction;Neuromuscular re-education;Patient/family education   PT Next Visit Plan Continue with core and LE strengthening and TM ambulation   PT Home Exercise Plan Continued from previous sessions.    Consulted and Agree with Plan of Care Patient      Patient will benefit from  skilled therapeutic intervention in order to improve the following deficits and impairments:  Abnormal gait, Decreased balance, Decreased endurance, Difficulty walking, Decreased strength  Visit Diagnosis: Muscle weakness (generalized)  Difficulty in walking, not elsewhere classified     Problem List There are no active problems to display for this patient.  Ezekiel InaKristine S Georgi Navarrete, PT, DPT [place], Barkley BrunsKristine S 05/17/2016, 3:59 PM  [place] [facility] [address] [place], [place], [address] Phone: [phone]   Fax:  [phone]  Name: Michelle Randall MRN: [id number] Date of Birth: November 14, 1996

## 2016-05-19 ENCOUNTER — Encounter: Payer: Medicaid Other | Admitting: Occupational Therapy

## 2016-05-19 ENCOUNTER — Ambulatory Visit: Payer: Medicaid Other | Admitting: Occupational Therapy

## 2016-05-19 ENCOUNTER — Ambulatory Visit: Payer: Medicaid Other

## 2016-05-19 ENCOUNTER — Ambulatory Visit: Payer: Medicaid Other | Admitting: Physical Therapy

## 2016-05-19 DIAGNOSIS — M6281 Muscle weakness (generalized): Secondary | ICD-10-CM | POA: Diagnosis not present

## 2016-05-19 NOTE — Therapy (Signed)
[place] MAIN [facility] SERVICES [address] [place], [place], [address] Phone: [phone]   Fax:  [phone]  Occupational Therapy Treatment  Patient Details  Name: Michelle Randall MRN: [id number] Date of Birth: 1996-08-28 No Data Recorded  Encounter Date: 05/19/2016      OT End of Session - 05/19/16 1100    Visit Number 27   Number of Visits 39   Date for OT Re-Evaluation 07/09/16   Authorization Type medicaid visit 6 of 80   OT Start Time 1300   OT Stop Time 1338   OT Time Calculation (min) 38 min   Activity Tolerance Patient tolerated treatment well   Behavior During Therapy [facility] for tasks assessed/performed      No past medical history on file.  No past surgical history on file.  There were no vitals filed for this visit.      Subjective Assessment - 05/19/16 1355    Subjective  Pt. reports feeling like she is getting stronger   Patient is accompained by: Family member   Patient Stated Goals wants to be independent with all tasks so she will be able to go away to college.        OT TREATMENT    Self-care:  Pt. worked on college related tasks. Pt. worked on lifting, moving, and placing a backpack onto the back of a chair. Pt. worked with a 7#, and 8# backpack. Pt. Worked on multiple reps with emphasis placed on taking the pack on and of the back of the seat. Pt. Education was provided about the various components of the task, and positioning of the pack and her RUE and wrist.  Therapeutic Exercise:  2# dumbbell for shoulder flexion, extension, abduction, and horizontal abduction 3# dumbbell ex. for elbow flexion and extension,  2# for forearm supination/pronation, wrist flexion/extension, and radial deviation. Pt. requires rest breaks and verbal cues for proper technique. Pt. Worked on lateral and 3 point pinch strengthening in the right hand using green, and blue resistive clips.Tactile and verbal cues were required for  eliciting the desired movement. Pt. Worked on reaching with a 2# cuff weight in place. Strengthening exercises were performed in order to improve UE strength needed for managing her backpack placement on a w/c in preparation for college.                         OT Education - 05/19/16 1442    Education provided Yes   Education Details strengthening, and coordination   Person(s) Educated Patient   Methods Explanation;Demonstration;Verbal cues   Comprehension Verbalized understanding;Returned demonstration             OT Long Term Goals - 04/14/16 1628    OT LONG TERM GOAL #1   Title Patient will demonstrate methods of picking up her bookbag and placing it onto the back of her chair with modified independence to transport items to class.   Baseline requires assistance from others to complete now but will need to be independent when at college in the fall .   Time 6   Period Months   Status On-going   OT LONG TERM GOAL #2   Title Patient will be able to make her bed with modified independence.   Baseline she continues to have difficulty with balance and completing this task, moderate assist.   Time 6   Period Months   Status On-going   OT LONG TERM GOAL #3  Title Patient will demonstrate the ability to perform vacuuming of one room with modified independence using light weight vacuum.   Baseline still requires max assist   Time 6   Period Months   Status On-going   OT LONG TERM GOAL #4   Title Patient will demonstrate the ability to hang clothes onto hangers to place in the closet with modified independence.   Baseline --   Time 6   Period Months   Status Achieved   OT LONG TERM GOAL #5   Title Patient will demonstrate donning and doffing her bra with modified independence.   Baseline max assist   Time 12   Period Weeks   Status New   OT LONG TERM GOAL #6   Title Will now be able to go through a door that swings out using motorized wheel chair.    Baseline difficulty depending on type of door and set up of the area, at times requires max assistance depending on the weight of the door.   Time 6   Period Months   Status On-going   OT LONG TERM GOAL #7   Title (Higher level grip) Will be able to squeeze a lemon during cooking.   Baseline --   Time 6   Period Months   Status Achieved   OT LONG TERM GOAL #8   Title Patient will be able to donn night leg brace independently before she goes to college.   Time 6   Period Months   Status Achieved   OT LONG TERM GOAL  #9   Baseline Patient will be able to braid her own hair before college   Time 6   Period Months   Status On-going   OT LONG TERM GOAL  #10   TITLE Wll be able to wash her own hair before starting college   Baseline mom still has to help with more than 20% of task now down from 50%.     Time 6   Period Months   Status On-going   OT LONG TERM GOAL  #11   TITLE Will be able to squeeze tooth paste out of the tube when it is less than half full.   Baseline still requires min assist if tube is less than 1/2 full.   Time 6   Period Months   Status Partially Met   OT LONG TERM GOAL  #12   TITLE Patient will demonstrate managing wheelchair rain poncho in inclement weather with modified independence.   Baseline --   Time 6   Period Months   Status New   OT LONG TERM GOAL  #13   TITLE Patient will be able to complete 2 1/2 minutes straight with left shoulder flexion simulating driving with a stearing wheel ball to be able to drive to college.    Baseline can do 30 second of shoulder flexion with 2 1/2 before fatigue and can only drive 15 min before right shoulder fatigues.   Time 6   Period Months   Status On-going   OT LONG TERM GOAL  #14   TITLE Will be able to open taped boxes (for moving to college) using a key or knife.   Baseline ongoing techniques explored with adaptive devices to assist.   Time 6   Period Months   Status On-going   OT LONG TERM GOAL  #15    TITLE Patient will be able to obtain food tray in the cafeteria from a wheelchair level and  take to the table without spilling with modified independence.    Baseline max assist   Time 6   Period Months   Status New               Plan - 05/19/16 1444    Clinical Impression Statement Pt. continues to work on improving BUE strength to be able lift a 7-8# backpack, position it, and place it at the back of a w/c during school/College classes.. Pt. is now able to lift the backpack from the floor, and is able to place the pack over the chair. Pt. continues to need to work on improving the strength needed to grasp and remove the pack from the back of the chair.    OT Frequency 2x / week   OT Duration 12 weeks   OT Treatment/Interventions Self-care/ADL training;Therapeutic exercise;Therapeutic exercises;Therapeutic activities   Consulted and Agree with Plan of Care Patient   Family Member Consulted mom      Patient will benefit from skilled therapeutic intervention in order to improve the following deficits and impairments:  Impaired UE functional use, Difficulty walking, Decreased strength, Decreased coordination, Decreased endurance, Decreased balance  Visit Diagnosis: Muscle weakness (generalized)    Problem List There are no active problems to display for this patient.  Harrel Carina, MS, OTR/L   Harrel Carina 05/19/2016, 5:05 PM  [place] MAIN [facility] SERVICES [address] [place], [place], [address] Phone: [phone]   Fax:  [phone]  Name: KAWTHAR ENNEN MRN: [id number] Date of Birth: December 21, 1995

## 2016-05-19 NOTE — Therapy (Signed)
[place] MAIN [facility] SERVICES [address] [place], [place], [address] Phone: [phone]   Fax:  [phone]  Occupational Therapy Treatment  Patient Details  Name: Michelle Randall MRN: [id number] Date of Birth: [birth year] No Data Recorded  Encounter Date: 05/17/2016      OT End of Session - 05/19/16 1100    Visit Number 27   Number of Visits 49   Date for OT Re-Evaluation 07/09/16   Authorization Type medicaid visit 6 of 39   OT Start Time 1300   OT Stop Time 1338   OT Time Calculation (min) 38 min   Activity Tolerance Patient tolerated treatment well   Behavior During Therapy [facility] for tasks assessed/performed      No past medical history on file.  No past surgical history on file.  There were no vitals filed for this visit.      Subjective Assessment - 05/18/16 1048    Subjective  Patient reports she is working on some summer reading at home and has been tutoring some days.    Patient Stated Goals wants to be independent with all tasks so she will be able to go away to college.   Currently in Pain? No/denies   Multiple Pain Sites No                      OT Treatments/Exercises (OP) - 05/18/16 1056    Fine Motor Coordination   Other Fine Motor Exercises Patient was seen this date for focus on coordination tasks including manipulation of small dowels, collars and washers from [place] pegboard. Cues for sorting the three pieces from [facility] to fingertips. Manipulation of coins from tabletop with right hand using translatory movements of the hand as well as using the hand for storage with cues.    Neurological Re-education Exercises   Other Exercises 1 Resistive pinch pins for lateral pinch and three point pinch with right and left hands placing them to elevated surface with cues for technique.  Patient able to complete all levels of resistance.                 OT Education - 05/19/16 1100    Education  provided Yes   Education Details manipulation skills   Person(s) Educated Patient   Methods Explanation;Demonstration;Verbal cues   Comprehension Verbal cues required;Returned demonstration;Verbalized understanding             OT Long Term Goals - 04/14/16 1628    OT LONG TERM GOAL #1   Title Patient will demonstrate methods of picking up her bookbag and placing it onto the back of her chair with modified independence to transport items to class.   Baseline requires assistance from others to complete now but will need to be independent when at college in the fall .   Time 6   Period Months   Status On-going   OT LONG TERM GOAL #2   Title Patient will be able to make her bed with modified independence.   Baseline she continues to have difficulty with balance and completing this task, moderate assist.   Time 6   Period Months   Status On-going   OT LONG TERM GOAL #3   Title Patient will demonstrate the ability to perform vacuuming of one room with modified independence using light weight vacuum.   Baseline still requires max assist   Time 6   Period Months   Status On-going   OT LONG  TERM GOAL #4   Title Patient will demonstrate the ability to hang clothes onto hangers to place in the closet with modified independence.   Baseline --   Time 6   Period Months   Status Achieved   OT LONG TERM GOAL #5   Title Patient will demonstrate donning and doffing her bra with modified independence.   Baseline max assist   Time 12   Period Weeks   Status New   OT LONG TERM GOAL #6   Title Will now be able to go through a door that swings out using motorized wheel chair.   Baseline difficulty depending on type of door and set up of the area, at times requires max assistance depending on the weight of the door.   Time 6   Period Months   Status On-going   OT LONG TERM GOAL #7   Title (Higher level grip) Will be able to squeeze a lemon during cooking.   Baseline --   Time 6   Period  Months   Status Achieved   OT LONG TERM GOAL #8   Title Patient will be able to donn night leg brace independently before she goes to college.   Time 6   Period Months   Status Achieved   OT LONG TERM GOAL  #9   Baseline Patient will be able to braid her own hair before college   Time 6   Period Months   Status On-going   OT LONG TERM GOAL  #10   TITLE Wll be able to wash her own hair before starting college   Baseline mom still has to help with more than 20% of task now down from 50%.     Time 6   Period Months   Status On-going   OT LONG TERM GOAL  #11   TITLE Will be able to squeeze tooth paste out of the tube when it is less than half full.   Baseline still requires min assist if tube is less than 1/2 full.   Time 6   Period Months   Status Partially Met   OT LONG TERM GOAL  #12   TITLE Patient will demonstrate managing wheelchair rain poncho in inclement weather with modified independence.   Baseline --   Time 6   Period Months   Status New   OT LONG TERM GOAL  #13   TITLE Patient will be able to complete 2 1/2 minutes straight with left shoulder flexion simulating driving with a stearing wheel ball to be able to drive to college.    Baseline can do 30 second of shoulder flexion with 2 1/2 before fatigue and can only drive 15 min before right shoulder fatigues.   Time 6   Period Months   Status On-going   OT LONG TERM GOAL  #14   TITLE Will be able to open taped boxes (for moving to college) using a key or knife.   Baseline ongoing techniques explored with adaptive devices to assist.   Time 6   Period Months   Status On-going   OT LONG TERM GOAL  #15   TITLE Patient will be able to obtain food tray in the cafeteria from a wheelchair level and take to the table without spilling with modified independence.    Baseline max assist   Time 6   Period Months   Status New               Plan -  05/19/16 1057    Clinical Impression Statement Patient continues to  progress in all areas of strength range of motion and coordination of both hands with focus on the right dominant hand. She will need continued emphasis on being able to lift a book bag weighing 8 to 10 pounds from the back of her wheelchair to the floor and from the back of the wheelchair to her lap. She has been working towards increased speed and dexterity with typing both using a computer as well as a tablet with the use of a touch screen. She is currently most efficient with the laptop keyboard. Patient continues to benefit from skilled occupational therapy to help her transition from high school to college.    Rehab Potential Good   OT Frequency 2x / week   OT Duration 12 weeks   OT Treatment/Interventions Self-care/ADL training;Therapeutic exercise;Therapeutic exercises;Therapeutic activities   Consulted and Agree with Plan of Care Patient      Patient will benefit from skilled therapeutic intervention in order to improve the following deficits and impairments:  Impaired UE functional use, Difficulty walking, Decreased strength, Decreased coordination, Decreased endurance, Decreased balance  Visit Diagnosis: Muscle weakness (generalized)  Other lack of coordination    Problem List There are no active problems to display for this patient.  Achilles Dunk, OTR/L, CLT  Romy Mcgue 05/19/2016, 11:01 AM  [place] MAIN [facility] SERVICES [place], [place], [address] Phone: [phone]   Fax:  [phone]  Name: Michelle Randall MRN: [id number] Date of Birth: 28-Feb-1996

## 2016-05-19 NOTE — Patient Instructions (Addendum)
OT TREATMENT    Self-care:  Pt. worked on college related tasks. Pt. worked on lifting, moving, and placing a backpack onto the back of a chair. Pt. worked with a 7#, and 8# backpack. Pt. Worked on multiple reps with emphasis placed on taking the pack on and of the back of the seat. Pt. Education was provided about the various components of the task, and positioning of the pack and her RUE and wrist.  Therapeutic Exercise:  2# dumbbell for shoulder flexion, extension, abduction, and horizontal abduction 3# dumbbell ex. for elbow flexion and extension,  2# for forearm supination/pronation, wrist flexion/extension, and radial deviation. Pt. requires rest breaks and verbal cues for proper technique. Pt. Worked on lateral and 3 point pinch strengthening in the right hand using green, and blue resistive clips.Tactlie and verbal cues were required for eliciting the desired movement. Pt. Worked on reaching with a 2# cuff weight in place. Strengthening exercises were performed in order to improve UE strength needed for managing her backpack.

## 2016-05-24 ENCOUNTER — Ambulatory Visit: Payer: Medicaid Other | Admitting: Occupational Therapy

## 2016-05-24 DIAGNOSIS — M6281 Muscle weakness (generalized): Secondary | ICD-10-CM

## 2016-05-24 DIAGNOSIS — R278 Other lack of coordination: Secondary | ICD-10-CM

## 2016-05-26 ENCOUNTER — Encounter: Payer: Medicaid Other | Admitting: Occupational Therapy

## 2016-05-26 ENCOUNTER — Encounter: Payer: Self-pay | Admitting: Occupational Therapy

## 2016-05-26 ENCOUNTER — Ambulatory Visit: Payer: Medicaid Other | Admitting: Physical Therapy

## 2016-05-26 ENCOUNTER — Ambulatory Visit: Payer: Medicaid Other | Admitting: Occupational Therapy

## 2016-05-26 DIAGNOSIS — R278 Other lack of coordination: Secondary | ICD-10-CM

## 2016-05-26 DIAGNOSIS — R262 Difficulty in walking, not elsewhere classified: Secondary | ICD-10-CM

## 2016-05-26 DIAGNOSIS — M6281 Muscle weakness (generalized): Secondary | ICD-10-CM | POA: Diagnosis not present

## 2016-05-26 NOTE — Therapy (Signed)
[place] [facility] MAIN [facility] [address] [place], [place], [id number] Phone: [phone]   Fax:  [phone]  Physical Therapy Treatment  Patient Details  Name: Michelle Randall MRN: [id number] Date of Birth: [birth year] No Data Recorded  Encounter Date: 05/26/2016      PT End of Session - 05/26/16 1353    Visit Number 44   Number of Visits 44   Date for PT Re-Evaluation 05/19/16   Authorization Type medicaid   PT Start Time 1348   PT Stop Time 1433   PT Time Calculation (min) 45 min   Equipment Utilized During Treatment Gait belt   Activity Tolerance Patient tolerated treatment well   Behavior [facility] for tasks assessed/performed      History reviewed. No pertinent past medical history.  History reviewed. No pertinent past surgical history.  There were no vitals filed for this visit.      Subjective Assessment - 05/26/16 1352    Subjective pt reports feeling fine today and states navigating curbs is about the same.    Patient is accompained by: Family member   Limitations Walking   Patient Stated Goals Patient wants to improve her core strength.    Currently in Pain? No/denies       THEREX nustep L2, LE only x 5 min (unbilled) obstacle course x 20 ft with 4" step up, step over 2 low thresholds, weave around cones, x 3 laps; CGA increased difficulty with LLE wall slides with theraball, 2x10 and wall sits with theraball 2x10 with 5 sec hold; CGA for balance sit <> stand with no UE support, 2x10 fwd and lateral heel taps on 4" step with BUE support, 2x10 each; CGA for balance bil toe taps with stepping stones (x3) elevated on airex pad x 5 each leg with 1 UE support; CGA and mod verbal cues to decrease trunk anterior/lateral flexion compensation; increased difficulty with LLE  bil marching on airex pad with 2 UE support (fingertips), 3x10 each; CGA for balance and cues to decrease trunk compensation; LLE more  difficult to lift and increased trunk compensation to lift.         PT Education - 05/26/16 1353    Education provided Yes   Education Details navigating obstacles, exercise technique   Person(s) Educated Patient   Methods Explanation   Comprehension Verbalized understanding             PT Long Term Goals - 05/26/16 1308    PT LONG TERM GOAL #1   Title Patient will improve Dynamic Gait Index (DGI) score to > 19/24 for low falls risk regarding dynamic walking tasks    Time 12   Period Weeks   Status On-going   PT LONG TERM GOAL #2   Title Patient will increase Berg Balance score by > 6 points to demonstrate decreased fall risk during functional activities    Time 12   Status On-going   PT LONG TERM GOAL #3   Title Patient will be able to transfer in and out of a large car with Patient will be able to transfer in and out of a high seated car with CGA    Time 12   Period Weeks   Status On-going   PT LONG TERM GOAL #4   Title Patient will complete a TUG test in < 12 seconds for independent mobility and decreased fall risk    Time 12   Period Weeks   Status On-going  PT LONG TERM GOAL #6   Title Patient will improve gait speed to > 1.2 m/s with least restrictive assistive device to return to normal walking speed    Time 12   Period Weeks   Status On-going   PT LONG TERM GOAL #7   Title Patient (< 63 years old) will complete five times sit to stand test in < 10 seconds indicating an increased LE strength and improved balance    Baseline Completed 05/07/2015 (9.85 seconds)    PT LONG TERM GOAL #8   Title Patient will be able to ambulate on inclines and grass independenlty with LRAD   Period Weeks   Status New   PT LONG TERM GOAL  #9   TITLE Patient will be abe to transfer from low chair or stool wihtout UE support independently   Period Weeks   Status On-going   PT LONG TERM GOAL  #10   TITLE Patient will be able to transfer from the floor to standing independently  with use of LRAD   Time 12   Period Weeks   Status On-going               Plan - 05/26/16 1439    Clinical Impression Statement pt continues with BLE weakness, L>R. She also continues to demonstrate lateral and anterior trunk flexion to compensate for LLE elevation during exercises and curb negotiation. pt demonstrated BLE fatigue towards end of session. pt needs continued skilled PT intervention to maximize overall independence and function.   Rehab Potential Good   Clinical Impairments Affecting Rehab Potential weakness and decreased standing balance   PT Frequency 1x / week   PT Duration 12 weeks   PT Treatment/Interventions Therapeutic exercise;Therapeutic activities;Gait training;Balance training;Stair training;DME Instruction;Neuromuscular re-education;Patient/family education   PT Next Visit Plan Continue with core and LE strengthening and TM ambulation   PT Home Exercise Plan Continued from previous sessions.    Consulted and Agree with Plan of Care Patient      Patient will benefit from skilled therapeutic intervention in order to improve the following deficits and impairments:  Abnormal gait, Decreased balance, Decreased endurance, Difficulty walking, Decreased strength  Visit Diagnosis: Muscle weakness (generalized)  Difficulty in walking, not elsewhere classified  Other lack of coordination     Problem List There are no active problems to display for this patient.  Jac Canavan, SPT  Jac Canavan 05/26/2016, 2:54 PM   This entire session was performed under direct supervision and direction of a licensed therapist/therapist assistant . I have personally read, edited and approve of the note as written. Mindy Lucillie Garfinkel, PT, DPT  05/26/2016, 4:29 PM [phone]   [facility] MAIN [facility] SERVICES [address] [place], [place], [address] Phone: [phone]   Fax:  [phone]  Name: ZAKIA SAINATO MRN:  [id number] Date of Birth: 1996-06-30

## 2016-05-26 NOTE — Addendum Note (Signed)
Addended by: Ezekiel InaMANSFIELD, Shameek Nyquist S on: 05/26/2016 01:22 PM   Modules accepted: Orders

## 2016-05-26 NOTE — Therapy (Signed)
[place] MAIN [facility] SERVICES [address] [place], [place], [address] Phone: [phone]   Fax:  [phone]  Occupational Therapy Treatment  Patient Details  Name: Michelle Randall MRN: [id number] Date of Birth: 02-Jun-1996 No Data Recorded  Encounter Date: 05/24/2016      OT End of Session - 05/25/16 2121    Visit Number 28   Number of Visits 70   Date for OT Re-Evaluation 07/09/16   Authorization Type medicaid visit 7 of 44   OT Start Time 1600   OT Stop Time 1646   OT Time Calculation (min) 46 min   Activity Tolerance Patient tolerated treatment well   Behavior During Therapy [facility] for tasks assessed/performed      History reviewed. No pertinent past medical history.  History reviewed. No pertinent past surgical history.  There were no vitals filed for this visit.      Subjective Assessment - 05/25/16 2111    Subjective  (p) Patient reports she had a good weekend, has to go with family tomorrow to take her grandma to the airport.   Patient Stated Goals (p) wants to be independent with all tasks so she will be able to go away to college.   Currently in Pain? (p) No/denies   Multiple Pain Sites (p) No                      OT Treatments/Exercises (OP) - 05/25/16 2118    ADLs   ADL Comments Patient was seen this date for participation with typing tasks using laptop computer, five minute trials the first trial resulting and 30 words per minute with five errors, With adjusted speed of 29 words per minute. Second trial resulted in 27 words per minute with three errors and 26 words per minute adjusted speed. Program utilized offered paragraph on top of screen with typing below.    Fine Motor Coordination   Other Fine Motor Exercises Patient seen for fine motor coordination tasks with emphasis on manipulation of quarter inch to half inch objects with right upper extremity, with cues for sorting using hand for storage, and  translatory movements of the hand.                 OT Education - 05/25/16 2120    Education provided Yes   Education Details Typing drills   Person(s) Educated Patient;Spouse;Parent(s)   Methods Explanation;Demonstration;Verbal cues   Comprehension Verbalized understanding;Returned demonstration;Verbal cues required             OT Long Term Goals - 04/14/16 1628    OT LONG TERM GOAL #1   Title Patient will demonstrate methods of picking up her bookbag and placing it onto the back of her chair with modified independence to transport items to class.   Baseline requires assistance from others to complete now but will need to be independent when at college in the fall .   Time 6   Period Months   Status On-going   OT LONG TERM GOAL #2   Title Patient will be able to make her bed with modified independence.   Baseline she continues to have difficulty with balance and completing this task, moderate assist.   Time 6   Period Months   Status On-going   OT LONG TERM GOAL #3   Title Patient will demonstrate the ability to perform vacuuming of one room with modified independence using light weight vacuum.   Baseline still requires  max assist   Time 6   Period Months   Status On-going   OT LONG TERM GOAL #4   Title Patient will demonstrate the ability to hang clothes onto hangers to place in the closet with modified independence.   Baseline --   Time 6   Period Months   Status Achieved   OT LONG TERM GOAL #5   Title Patient will demonstrate donning and doffing her bra with modified independence.   Baseline max assist   Time 12   Period Weeks   Status New   OT LONG TERM GOAL #6   Title Will now be able to go through a door that swings out using motorized wheel chair.   Baseline difficulty depending on type of door and set up of the area, at times requires max assistance depending on the weight of the door.   Time 6   Period Months   Status On-going   OT LONG TERM  GOAL #7   Title (Higher level grip) Will be able to squeeze a lemon during cooking.   Baseline --   Time 6   Period Months   Status Achieved   OT LONG TERM GOAL #8   Title Patient will be able to donn night leg brace independently before she goes to college.   Time 6   Period Months   Status Achieved   OT LONG TERM GOAL  #9   Baseline Patient will be able to braid her own hair before college   Time 6   Period Months   Status On-going   OT LONG TERM GOAL  #10   TITLE Wll be able to wash her own hair before starting college   Baseline mom still has to help with more than 20% of task now down from 50%.     Time 6   Period Months   Status On-going   OT LONG TERM GOAL  #11   TITLE Will be able to squeeze tooth paste out of the tube when it is less than half full.   Baseline still requires min assist if tube is less than 1/2 full.   Time 6   Period Months   Status Partially Met   OT LONG TERM GOAL  #12   TITLE Patient will demonstrate managing wheelchair rain poncho in inclement weather with modified independence.   Baseline --   Time 6   Period Months   Status New   OT LONG TERM GOAL  #13   TITLE Patient will be able to complete 2 1/2 minutes straight with left shoulder flexion simulating driving with a stearing wheel ball to be able to drive to college.    Baseline can do 30 second of shoulder flexion with 2 1/2 before fatigue and can only drive 15 min before right shoulder fatigues.   Time 6   Period Months   Status On-going   OT LONG TERM GOAL  #14   TITLE Will be able to open taped boxes (for moving to college) using a key or knife.   Baseline ongoing techniques explored with adaptive devices to assist.   Time 6   Period Months   Status On-going   OT LONG TERM GOAL  #15   TITLE Patient will be able to obtain food tray in the cafeteria from a wheelchair level and take to the table without spilling with modified independence.    Baseline max assist   Time 6   Period  Months  Status New               Plan - 05/25/16 2121    Clinical Impression Statement Patient continues to work towards transition from high school graduation to entrance into college next month. She is participating in focused trials of typing on computer in order to prepare for notetaking and assignment completion in college. She is averaging between 27 to 36 words per minute depending on the complexity of the information. She continues to benefit from skilled occupational therapy to work towards greater independence in daily activities at home and with anticipated activities with her transition to college.    Rehab Potential Good   OT Frequency 2x / week   OT Duration 12 weeks   OT Treatment/Interventions Self-care/ADL training;Therapeutic exercise;Therapeutic exercises;Therapeutic activities   Consulted and Agree with Plan of Care Patient      Patient will benefit from skilled therapeutic intervention in order to improve the following deficits and impairments:  Impaired UE functional use, Difficulty walking, Decreased strength, Decreased coordination, Decreased endurance, Decreased balance  Visit Diagnosis: Other lack of coordination  Muscle weakness (generalized)    Problem List There are no active problems to display for this patient.  Achilles Dunk, OTR/L, CLT  Nneoma Harral 05/26/2016, 9:24 PM  [place] MAIN [facility] SERVICES [address] [place], [place], [address] Phone: [phone]   Fax:  [phone]  Name: Michelle Randall MRN: [id number] Date of Birth: 1996/03/11

## 2016-05-27 NOTE — Therapy (Signed)
[place] MAIN [facility] SERVICES [address] [place], [place], [address] Phone: [phone]   Fax:  [phone]  Occupational Therapy Treatment  Patient Details  Name: ALETTA EDMUNDS MRN: [id number] Date of Birth: Oct 18, 1996 No Data Recorded  Encounter Date: 05/26/2016      OT End of Session - 05/27/16 1030    Visit Number 29   Number of Visits 59   Date for OT Re-Evaluation 07/09/16   Authorization Type medicaid visit 8 of 87   OT Start Time 1445   OT Stop Time 1515   OT Time Calculation (min) 30 min   Activity Tolerance Patient tolerated treatment well   Behavior During Therapy [facility] for tasks assessed/performed      No past medical history on file.  No past surgical history on file.  There were no vitals filed for this visit.      Subjective Assessment - 05/27/16 1027    Subjective  Patient reports they took her grandmother to the airport in [place] yesterday. Did not get home until 4 AM.   Patient Stated Goals wants to be independent with all tasks so she will be able to go away to college.   Currently in Pain? No/denies   Multiple Pain Sites No                      OT Treatments/Exercises (OP) - 05/27/16 1028    Fine Motor Coordination   Other Fine Motor Exercises Patient seen for manipulation of [place] disks for turning and flipping in right hand with cues for technique.   Neurological Re-education Exercises   Other Exercises 1 Patient was seen this date for bilateral upper extremity strength and with [place] tower completed 4 levels  from seated position with no weight added for 1st trial. Second trial 2 pound wrist weight was added to right and left upper extremities completing all four levels for two sets with cues.                 OT Education - 05/27/16 1029    Education provided Yes   Education Details HEP   Person(s) Educated Patient   Methods Explanation;Demonstration;Verbal cues    Comprehension Returned demonstration;Verbalized understanding;Verbal cues required             OT Long Term Goals - 04/14/16 1628    OT LONG TERM GOAL #1   Title Patient will demonstrate methods of picking up her bookbag and placing it onto the back of her chair with modified independence to transport items to class.   Baseline requires assistance from others to complete now but will need to be independent when at college in the fall .   Time 6   Period Months   Status On-going   OT LONG TERM GOAL #2   Title Patient will be able to make her bed with modified independence.   Baseline she continues to have difficulty with balance and completing this task, moderate assist.   Time 6   Period Months   Status On-going   OT LONG TERM GOAL #3   Title Patient will demonstrate the ability to perform vacuuming of one room with modified independence using light weight vacuum.   Baseline still requires max assist   Time 6   Period Months   Status On-going   OT LONG TERM GOAL #4   Title Patient will demonstrate the ability to hang clothes onto hangers to place in  the closet with modified independence.   Baseline --   Time 6   Period Months   Status Achieved   OT LONG TERM GOAL #5   Title Patient will demonstrate donning and doffing her bra with modified independence.   Baseline max assist   Time 12   Period Weeks   Status New   OT LONG TERM GOAL #6   Title Will now be able to go through a door that swings out using motorized wheel chair.   Baseline difficulty depending on type of door and set up of the area, at times requires max assistance depending on the weight of the door.   Time 6   Period Months   Status On-going   OT LONG TERM GOAL #7   Title (Higher level grip) Will be able to squeeze a lemon during cooking.   Baseline --   Time 6   Period Months   Status Achieved   OT LONG TERM GOAL #8   Title Patient will be able to donn night leg brace independently before she  goes to college.   Time 6   Period Months   Status Achieved   OT LONG TERM GOAL  #9   Baseline Patient will be able to braid her own hair before college   Time 6   Period Months   Status On-going   OT LONG TERM GOAL  #10   TITLE Wll be able to wash her own hair before starting college   Baseline mom still has to help with more than 20% of task now down from 50%.     Time 6   Period Months   Status On-going   OT LONG TERM GOAL  #11   TITLE Will be able to squeeze tooth paste out of the tube when it is less than half full.   Baseline still requires min assist if tube is less than 1/2 full.   Time 6   Period Months   Status Partially Met   OT LONG TERM GOAL  #12   TITLE Patient will demonstrate managing wheelchair rain poncho in inclement weather with modified independence.   Baseline --   Time 6   Period Months   Status New   OT LONG TERM GOAL  #13   TITLE Patient will be able to complete 2 1/2 minutes straight with left shoulder flexion simulating driving with a stearing wheel ball to be able to drive to college.    Baseline can do 30 second of shoulder flexion with 2 1/2 before fatigue and can only drive 15 min before right shoulder fatigues.   Time 6   Period Months   Status On-going   OT LONG TERM GOAL  #14   TITLE Will be able to open taped boxes (for moving to college) using a key or knife.   Baseline ongoing techniques explored with adaptive devices to assist.   Time 6   Period Months   Status On-going   OT LONG TERM GOAL  #15   TITLE Patient will be able to obtain food tray in the cafeteria from a wheelchair level and take to the table without spilling with modified independence.    Baseline max assist   Time 6   Period Months   Status New               Plan - 05/27/16 1030    Clinical Impression Statement Patient continues to progress in strength and coordination which will help  contribute to increased independence and tasks required for transition to  college. She will need to be able to move her book bag from the back of her wheelchair and to and from the floor. She is currently able to manage about 7 pounds in weight with a book bag from the back of her chair.   Rehab Potential Good   OT Frequency 2x / week   OT Duration 12 weeks   OT Treatment/Interventions Self-care/ADL training;Therapeutic exercise;Therapeutic exercises;Therapeutic activities   Consulted and Agree with Plan of Care Patient      Patient will benefit from skilled therapeutic intervention in order to improve the following deficits and impairments:  Impaired UE functional use, Difficulty walking, Decreased strength, Decreased coordination, Decreased endurance, Decreased balance  Visit Diagnosis: Muscle weakness (generalized)  Other lack of coordination    Problem List There are no active problems to display for this patient.  Achilles Dunk, OTR/L, CLT  Chenelle Benning 05/27/2016, 10:32 AM  [place] MAIN [facility] SERVICES [place], [place], [address] Phone: [phone]   Fax:  [phone]  Name: SHAHIRA FISKE MRN: [id number] Date of Birth: March 05, 1996

## 2016-05-31 ENCOUNTER — Encounter: Payer: Medicaid Other | Admitting: Occupational Therapy

## 2016-05-31 DIAGNOSIS — R32 Unspecified urinary incontinence: Secondary | ICD-10-CM | POA: Insufficient documentation

## 2016-06-01 ENCOUNTER — Encounter: Payer: Medicaid Other | Admitting: Occupational Therapy

## 2016-06-02 ENCOUNTER — Encounter: Payer: Self-pay | Admitting: Occupational Therapy

## 2016-06-02 ENCOUNTER — Ambulatory Visit: Payer: Medicaid Other | Admitting: Physical Therapy

## 2016-06-02 ENCOUNTER — Ambulatory Visit: Payer: Medicaid Other | Admitting: Occupational Therapy

## 2016-06-02 ENCOUNTER — Encounter: Payer: Self-pay | Admitting: Physical Therapy

## 2016-06-02 DIAGNOSIS — M6281 Muscle weakness (generalized): Secondary | ICD-10-CM | POA: Diagnosis not present

## 2016-06-02 DIAGNOSIS — R262 Difficulty in walking, not elsewhere classified: Secondary | ICD-10-CM

## 2016-06-02 DIAGNOSIS — R278 Other lack of coordination: Secondary | ICD-10-CM

## 2016-06-02 NOTE — Therapy (Signed)
[place] [facility] MAIN [facility] SERVICES [address] [place], [place], [address] Phone: [phone]   Fax:  [phone]  Physical Therapy Treatment  Patient Details  Name: Michelle Randall MRN: [id number] Date of Birth: Jun 19, 1996 No Data Recorded  Encounter Date: 06/02/2016      PT End of Session - 06/02/16 0445    Visit Number 45   Number of Visits 44   Date for PT Re-Evaluation 05/19/16   Authorization Type medicaid   PT Start Time 0450   PT Stop Time 0530   PT Time Calculation (min) 40 min   Equipment Utilized During Treatment Gait belt   Activity Tolerance Patient tolerated treatment well   Behavior During Therapy [facility] for tasks assessed/performed      History reviewed. No pertinent past medical history.  History reviewed. No pertinent past surgical history.  There were no vitals filed for this visit.      Subjective Assessment - 06/02/16 1656    Subjective Patient reports that she is feeling well.    Limitations Walking   Patient Stated Goals Patient wants to improve her core strength.    Currently in Pain? No/denies     TM walking at elevation 1.0 and 10 mintues  Leg press x 90 lbs x 20 x 3  Leg press 90 lbs x 20x 2 Side stepping in parallel bars x 10 x 2 YTB hip abd x 15 x 2 BLE  Patient needs occasional verbal cueing to improve posture and cueing to correctly perform exercises slowly, holding at end of range to increase motor firing of desired muscle to encourage fatigue.                               PT Education - 06/02/16 1652    Education provided Yes   Education Details HEP   Person(s) Educated Patient   Methods Explanation   Comprehension Verbalized understanding             PT Long Term Goals - 05/26/16 1308    PT LONG TERM GOAL #1   Title Patient will improve Dynamic Gait Index (DGI) score to > 19/24 for low falls risk regarding dynamic walking tasks    Time 12   Period Weeks    Status On-going   PT LONG TERM GOAL #2   Title Patient will increase Berg Balance score by > 6 points to demonstrate decreased fall risk during functional activities    Time 12   Status On-going   PT LONG TERM GOAL #3   Title Patient will be able to transfer in and out of a large car with Patient will be able to transfer in and out of a high seated car with CGA    Time 12   Period Weeks   Status On-going   PT LONG TERM GOAL #4   Title Patient will complete a TUG test in < 12 seconds for independent mobility and decreased fall risk    Time 12   Period Weeks   Status On-going   PT LONG TERM GOAL #6   Title Patient will improve gait speed to > 1.2 m/s with least restrictive assistive device to return to normal walking speed    Time 12   Period Weeks   Status On-going   PT LONG TERM GOAL #7   Title Patient (< 34 years old) will complete five times sit to stand test  in < 10 seconds indicating an increased LE strength and improved balance    Baseline Completed 05/07/2015 (9.85 seconds)    PT LONG TERM GOAL #8   Title Patient will be able to ambulate on inclines and grass independenlty with LRAD   Period Weeks   Status New   PT LONG TERM GOAL  #9   TITLE Patient will be abe to transfer from low chair or stool wihtout UE support independently   Period Weeks   Status On-going   PT LONG TERM GOAL  #10   TITLE Patient will be able to transfer from the floor to standing independently with use of LRAD   Time 12   Period Weeks   Status On-going               Plan - 06/02/16 1652    Clinical Impression Statement Patient continues to have LE weakness , static and dynamic standing balance defictis and ambulates iwth loftstrand crutches. She has diffiulty with curbs and steps.    Rehab Potential Good   Clinical Impairments Affecting Rehab Potential weakness and decreased standing balance   PT Frequency 1x / week   PT Duration 12 weeks   PT Next Visit Plan Continue with core and LE  strengthening and TM ambulation   PT Home Exercise Plan Continued from previous sessions.    Consulted and Agree with Plan of Care Patient      Patient will benefit from skilled therapeutic intervention in order to improve the following deficits and impairments:  Abnormal gait, Decreased balance, Decreased endurance, Difficulty walking, Decreased strength  Visit Diagnosis: Muscle weakness (generalized)  Difficulty in walking, not elsewhere classified     Problem List There are no active problems to display for this patient.  Ezekiel InaKristine S Sabrinia Prien, PT, DPT [place] 06/02/2016, 5:01 PM  [place] [facility] MAIN [facility] [address] [place], [place], [id number] Phone: [phone]   Fax:  [phone]  Name: Michelle Randall MRN: [id number] Date of Birth: 1996-10-15

## 2016-06-02 NOTE — Therapy (Signed)
[place] MAIN [facility] SERVICES [address] [place], [place], [address] Phone: [phone]   Fax:  [phone]  Occupational Therapy Treatment  Patient Details  Name: Michelle Randall MRN: [id number] Date of Birth: 01-01-1996 No Data Recorded  Encounter Date: 06/02/2016      OT End of Session - 06/02/16 1727    Visit Number 30   Number of Visits 70   Date for OT Re-Evaluation 07/09/16   Authorization Type medicaid visit 9 of 62   OT Start Time 1600   OT Stop Time 1648   OT Time Calculation (min) 48 min   Activity Tolerance Patient tolerated treatment well   Behavior During Therapy [facility] for tasks assessed/performed      History reviewed. No pertinent past medical history.  History reviewed. No pertinent past surgical history.  There were no vitals filed for this visit.      Subjective Assessment - 06/02/16 1705    Subjective  Patient reports that she is getting excite about going to [employer] college soon   Patient Stated Goals wants to be independent with all tasks so she will be able to go away to college.   Currently in Pain? No/denies   Multiple Pain Sites No                      OT Treatments/Exercises (OP) - 06/02/16 1706    ADLs   ADL Comments Patient seen for typing exercises on laptop.  5 minute timed test completed at 27 words per minute with 1 error and 99% accuracy.  Added more exercises under Typing Tutor to do at home.  Practiced one for word typing with 94% accuracy with 84 hits and 5 misses at 20 words per minute for 4 minutes with 94% accuracy.   Fine Motor Coordination   Other Fine Motor Exercises Patient seen for picking up toothpicks with tweezers using pointed end one at a time and placing in small opening in container on table.  Pt able to complete task in 10 minutes with 2 rest breaks.  Educated in stretching exercises to decrease tightness in hands with elbow, wrist and fingers extended.                 OT Education - 06/02/16 1726    Education provided Yes   Education Details stretching exercises to decrease tightness in hands after fine motor tasks with and without weight bearing   Person(s) Educated Patient   Methods Explanation;Demonstration   Comprehension Verbalized understanding             OT Long Term Goals - 06/02/16 1734    OT LONG TERM GOAL #1   Title Patient will demonstrate methods of picking up her bookbag and placing it onto the back of her chair with modified independence to transport items to class.   Baseline requires assistance from others to complete now but will need to be independent when at college in the fall .   Time 6   Period Months   Status On-going   OT LONG TERM GOAL #2   Title Patient will be able to make her bed with modified independence.   Time 6   Period Months   Status On-going   OT LONG TERM GOAL #3   Title Patient will demonstrate the ability to perform vacuuming of one room with modified independence using light weight vacuum.   Baseline still requires max assist   Time 6  Period Months   OT LONG TERM GOAL #4   Time 6   OT LONG TERM GOAL #5   Title Patient will demonstrate donning and doffing her bra with modified independence.   Baseline max assist   Time 12   Period Weeks   Status On-going   OT LONG TERM GOAL #6   Title Will now be able to go through a door that swings out using motorized wheel chair.   Baseline difficulty depending on type of door and set up of the area, at times requires max assistance depending on the weight of the door.   Time 6   Status On-going   OT LONG TERM GOAL  #9   Baseline Patient will be able to braid her own hair before college   Time 6   Period Months   Status On-going   OT LONG TERM GOAL  #10   TITLE Wll be able to wash her own hair before starting college   Baseline mom still has to help with more than 20% of task now down from 50%.     Time 6   Period Months    Status On-going   OT LONG TERM GOAL  #11   TITLE Will be able to squeeze tooth paste out of the tube when it is less than half full.   Baseline still requires min assist if tube is less than 1/2 full.   Period Months   Status Partially Met   OT LONG TERM GOAL  #12   TITLE Patient will demonstrate managing wheelchair rain poncho in inclement weather with modified independence.   Baseline crutch tips are difficult for anyone to take them off, goal deferred.   Time 6   Period Months   Status On-going   OT LONG TERM GOAL  #13   TITLE Patient will be able to complete 2 1/2 minutes straight with left shoulder flexion simulating driving with a stearing wheel ball to be able to drive to college.    Baseline can do 30 second of shoulder flexion with 2 1/2 before fatigue and can only drive 15 min before right shoulder fatigues.   Time 6   Period Months   Status On-going   OT LONG TERM GOAL  #14   TITLE Will be able to open taped boxes (for moving to college) using a key or knife.   Baseline ongoing techniques explored with adaptive devices to assist.   Time 6   Period Months   Status On-going   OT LONG TERM GOAL  #15   TITLE Patient will be able to obtain food tray in the cafeteria from a wheelchair level and take to the table without spilling with modified independence.    Baseline max assist   Time 6   Period Months   Status On-going               Plan - 06/02/16 1728    Clinical Impression Statement Patient continues to be motivated to reach goals before attending [employer] the end of August. She continues to make progress in typing skills needed on laptop for taking notes.  Speed and dexterity continue to improve for both typing and fine motor manipulation tasks for single and bilateral tasks.  She reports that getting shampoo onto her hair in the shower is still difficult and will continue to be addressed as well as moving back pack from wheelchair and to and from the floor.    Rehab Potential Good  OT Frequency 2x / week   OT Duration 12 weeks   OT Treatment/Interventions Self-care/ADL training;Therapeutic exercise;Therapeutic exercises;Therapeutic activities   OT Home Exercise Plan added more typing programs to work on at home on laptop   Consulted and Agree with Plan of Care Patient      Patient will benefit from skilled therapeutic intervention in order to improve the following deficits and impairments:  Impaired UE functional use, Difficulty walking, Decreased strength, Decreased coordination, Decreased endurance, Decreased balance  Visit Diagnosis: Muscle weakness (generalized)  Other lack of coordination    Problem List There are no active problems to display for this patient.   Chrys Racer, OTR/L ascom [phone] 06/02/2016, 5:44 PM  [place] MAIN [facility] SERVICES [address] [place], [place], [address] Phone: [phone]   Fax:  [phone]  Name: Michelle Randall MRN: [id number] Date of Birth: [birth year]

## 2016-06-04 ENCOUNTER — Encounter: Payer: Self-pay | Admitting: Occupational Therapy

## 2016-06-04 ENCOUNTER — Ambulatory Visit: Payer: Medicaid Other | Admitting: Occupational Therapy

## 2016-06-04 DIAGNOSIS — M6281 Muscle weakness (generalized): Secondary | ICD-10-CM

## 2016-06-04 NOTE — Patient Instructions (Signed)
OT TREATMENT    Therapeutic Exercise:  Pt. Worked on UE there. Ex for strengthening to be able to handle a full backpack. Pt. Worked on BUE therapeutic ex with 3# dumbell ex for shoulder flexion extension, abduction, elbow flexion/extension, forearm supination/pronation, 2# for wrist extension, and radial deviation. Pt. Required visual demonstration for exercise.  Selfcare:  Pt. Worked on improving typing skills in preparation for college level note taking and course work.  Pt. Completed the 1 min. Typing test in 34 wpm. (34/90). Pt. worked on Training and development officersentence drills with 97% accuracy  And 27 wpm.

## 2016-06-04 NOTE — Therapy (Signed)
[place] MAIN [facility] SERVICES [address] [place], [place], [address] Phone: [phone]   Fax:  [phone]  Occupational Therapy Treatment  Patient Details  Name: Michelle Randall MRN: [id number] Date of Birth: [birth year] No Data Recorded  Encounter Date: 06/04/2016      OT End of Session - 06/04/16 1158    Visit Number 31   Number of Visits 79   Date for OT Re-Evaluation 07/09/16   Authorization Type medicaid visit 10 of 5   OT Start Time 1110   OT Stop Time 1150   OT Time Calculation (min) 40 min   Activity Tolerance Patient tolerated treatment well   Behavior During Therapy [facility] for tasks assessed/performed      History reviewed. No pertinent past medical history.  History reviewed. No pertinent past surgical history.  There were no vitals filed for this visit.      Subjective Assessment - 06/04/16 1157    Subjective  Pt. reports she is trying to work on writing and typing speed at home.   Patient is accompained by: Family member   Patient Stated Goals wants to be independent with all tasks so she will be able to go away to college.         OT TREATMENT    Therapeutic Exercise:  Pt. Worked on UE there. Ex for strengthening to be able to handle a full backpack. Pt. Worked on BUE therapeutic ex with 3# dumbell ex for shoulder flexion extension, abduction, elbow flexion/extension, forearm supination/pronation, 2# for wrist extension, and radial deviation. Pt. Required visual demonstration for exercise.  Selfcare:  Pt. Worked on improving typing skills in preparation for college level note taking and course work.  Pt. Completed the 1 min. Typing test in 34 wpm. (34/90). Pt. worked on Horticulturist, commercial with 97% accuracy  And 27 wpm.                        OT Education - 06/04/16 1158    Education provided Yes   Person(s) Educated Patient   Methods Explanation;Demonstration   Comprehension Verbalized  understanding             OT Long Term Goals - 06/02/16 1734    OT LONG TERM GOAL #1   Title Patient will demonstrate methods of picking up her bookbag and placing it onto the back of her chair with modified independence to transport items to class.   Baseline requires assistance from others to complete now but will need to be independent when at college in the fall .   Time 6   Period Months   Status On-going   OT LONG TERM GOAL #2   Title Patient will be able to make her bed with modified independence.   Time 6   Period Months   Status On-going   OT LONG TERM GOAL #3   Title Patient will demonstrate the ability to perform vacuuming of one room with modified independence using light weight vacuum.   Baseline still requires max assist   Time 6   Period Months   OT LONG TERM GOAL #4   Time 6   OT LONG TERM GOAL #5   Title Patient will demonstrate donning and doffing her bra with modified independence.   Baseline max assist   Time 12   Period Weeks   Status On-going   OT LONG TERM GOAL #6   Title Will now be able to  go through a door that swings out using motorized wheel chair.   Baseline difficulty depending on type of door and set up of the area, at times requires max assistance depending on the weight of the door.   Time 6   Status On-going   OT LONG TERM GOAL  #9   Baseline Patient will be able to braid her own hair before college   Time 6   Period Months   Status On-going   OT LONG TERM GOAL  #10   TITLE Wll be able to wash her own hair before starting college   Baseline mom still has to help with more than 20% of task now down from 50%.     Time 6   Period Months   Status On-going   OT LONG TERM GOAL  #11   TITLE Will be able to squeeze tooth paste out of the tube when it is less than half full.   Baseline still requires min assist if tube is less than 1/2 full.   Period Months   Status Partially Met   OT LONG TERM GOAL  #12   TITLE Patient will demonstrate  managing wheelchair rain poncho in inclement weather with modified independence.   Baseline crutch tips are difficult for anyone to take them off, goal deferred.   Time 6   Period Months   Status On-going   OT LONG TERM GOAL  #13   TITLE Patient will be able to complete 2 1/2 minutes straight with left shoulder flexion simulating driving with a stearing wheel ball to be able to drive to college.    Baseline can do 30 second of shoulder flexion with 2 1/2 before fatigue and can only drive 15 min before right shoulder fatigues.   Time 6   Period Months   Status On-going   OT LONG TERM GOAL  #14   TITLE Will be able to open taped boxes (for moving to college) using a key or knife.   Baseline ongoing techniques explored with adaptive devices to assist.   Time 6   Period Months   Status On-going   OT LONG TERM GOAL  #15   TITLE Patient will be able to obtain food tray in the cafeteria from a wheelchair level and take to the table without spilling with modified independence.    Baseline max assist   Time 6   Period Months   Status On-going               Plan - 06/04/16 1200    Clinical Impression Statement Pt. continues to work on improving typing speed which is 34 wpm (34/90). Pt. continues to work on improving UE strengthening to be able to carry, and handle a full bacpack, as well as take it on and off the back of a w/c efficiently during college courses. Pt. continues to require work on dressing efficiently., as pt. and mother report pt. has difficulty managing pants efficiently for morning care, and  toileting.   Rehab Potential Good   OT Frequency 2x / week   OT Duration 12 weeks   OT Treatment/Interventions Self-care/ADL training;Therapeutic exercise;Therapeutic exercises;Therapeutic activities   OT Home Exercise Plan added more typing programs to work on at home on laptop   Consulted and Agree with Plan of Care Patient   Family Member Consulted mom      Patient will  benefit from skilled therapeutic intervention in order to improve the following deficits and impairments:  Impaired  UE functional use, Difficulty walking, Decreased strength, Decreased coordination, Decreased endurance, Decreased balance  Visit Diagnosis: No diagnosis found.    Problem List There are no active problems to display for this patient.  Harrel Carina, MS, OTR/L   Harrel Carina 06/04/2016, 12:17 PM  [place] MAIN [facility] SERVICES [address] [place], [place], [address] Phone: [phone]   Fax:  [phone]  Name: SYLINA HENION MRN: [id number] Date of Birth: 29-Dec-1995

## 2016-06-09 ENCOUNTER — Encounter: Payer: Self-pay | Admitting: Physical Therapy

## 2016-06-09 ENCOUNTER — Encounter: Payer: Medicaid Other | Admitting: Occupational Therapy

## 2016-06-09 ENCOUNTER — Ambulatory Visit: Payer: Medicaid Other | Admitting: Physical Therapy

## 2016-06-09 ENCOUNTER — Ambulatory Visit: Payer: Medicaid Other | Admitting: Occupational Therapy

## 2016-06-09 DIAGNOSIS — Z741 Need for assistance with personal care: Secondary | ICD-10-CM

## 2016-06-09 DIAGNOSIS — M6281 Muscle weakness (generalized): Secondary | ICD-10-CM | POA: Diagnosis not present

## 2016-06-09 DIAGNOSIS — R460 Very low level of personal hygiene: Secondary | ICD-10-CM

## 2016-06-09 DIAGNOSIS — R279 Unspecified lack of coordination: Secondary | ICD-10-CM

## 2016-06-09 NOTE — Therapy (Signed)
[place] [facility] SERVICES [address] [place], [place], [address] Phone: [phone]   Fax:  [phone]  Physical Therapy Treatment  Patient Details  Name: Michelle Randall MRN: [id number] Date of Birth: 1995/12/10 No Data Recorded  Encounter Date: 06/09/2016      PT End of Session - 06/09/16 1701    Visit Number 46   Number of Visits 44   Date for PT Re-Evaluation 05/19/16   Authorization Type medicaid   PT Start Time 0445   PT Stop Time 0525   PT Time Calculation (min) 40 min   Equipment Utilized During Treatment Gait belt   Activity Tolerance Patient tolerated treatment well   Behavior During Therapy [facility] for tasks assessed/performed      History reviewed. No pertinent past medical history.  History reviewed. No pertinent surgical history.  There were no vitals filed for this visit.      Subjective Assessment - 06/09/16 1700    Subjective Patient reports that she is feeling well.    Patient is accompained by: Family member   Limitations Walking   Patient Stated Goals Patient wants to improve her core strength.    Multiple Pain Sites No      Therapeutic exercise: Leg press 90 lbs x 20 x 3 sets Step ups x 20 x 2 TM with elevation of 2 x 10 minutes @ 1.0 miles / hour Side stepping with loftstrand crutches with SBA Patient needs  verbal cueing to improve flexed  posture and cueing to correctly perform exercises slowly..                            PT Education - 06/09/16 1701    Education provided Yes   Education Details use of heat and ice for thoracic spine   Person(s) Educated Patient   Methods Explanation   Comprehension Verbalized understanding             PT Long Term Goals - 05/26/16 1308      PT LONG TERM GOAL #1   Title Patient will improve Dynamic Gait Index (DGI) score to > 19/24 for low falls risk regarding dynamic walking tasks    Time 12   Period Weeks   Status  On-going     PT LONG TERM GOAL #2   Title Patient will increase Berg Balance score by > 6 points to demonstrate decreased fall risk during functional activities    Time 12   Status On-going     PT LONG TERM GOAL #3   Title Patient will be able to transfer in and out of a large car with Patient will be able to transfer in and out of a high seated car with CGA    Time 12   Period Weeks   Status On-going     PT LONG TERM GOAL #4   Title Patient will complete a TUG test in < 12 seconds for independent mobility and decreased fall risk    Time 12   Period Weeks   Status On-going     PT LONG TERM GOAL #6   Title Patient will improve gait speed to > 1.2 m/s with least restrictive assistive device to return to normal walking speed    Time 12   Period Weeks   Status On-going     PT LONG TERM GOAL #7   Title Patient (< 66 years old) will complete five times  sit to stand test in < 10 seconds indicating an increased LE strength and improved balance    Baseline Completed 05/07/2015 (9.85 seconds)      PT LONG TERM GOAL #8   Title Patient will be able to ambulate on inclines and grass independenlty with LRAD   Period Weeks   Status New     PT LONG TERM GOAL  #9   TITLE Patient will be abe to transfer from low chair or stool wihtout UE support independently   Period Weeks   Status On-going     PT LONG TERM GOAL  #10   TITLE Patient will be able to transfer from the floor to standing independently with use of LRAD   Time 12   Period Weeks   Status On-going               Plan - 06/09/16 1701    Clinical Impression Statement Patient has LE weakness and static and dyanmic standing balance deficits. She fell while she was walking at her brothers house in his kitchen.  Patient is able to perform strengthening and balance exercises wihtout pain behaviors.    Rehab Potential Good   PT Frequency 1x / week   PT Duration 12 weeks   PT Treatment/Interventions Therapeutic  exercise;Therapeutic activities;Gait training;Balance training;Stair training;DME Instruction;Neuromuscular re-education;Patient/family education   PT Next Visit Plan Continue with core and LE strengthening and TM ambulation   PT Home Exercise Plan Continued from previous sessions.    Consulted and Agree with Plan of Care Patient      Patient will benefit from skilled therapeutic intervention in order to improve the following deficits and impairments:  Abnormal gait, Decreased balance, Decreased endurance, Difficulty walking, Decreased strength  Visit Diagnosis: Muscle weakness (generalized)     Problem List There are no active problems to display for this patient.  [name], PT, DPT [place], [place] 06/09/2016, 5:08 PM   [facility] MAIN [facility] SERVICES [address] [place], [place], [address] Phone: [phone]   Fax:  [phone]  Name: BRENA WINDSOR MRN: [id number] Date of Birth: 01-06-1996

## 2016-06-09 NOTE — Therapy (Signed)
[place] MAIN [facility] SERVICES [address] [place], [place], [address] Phone: [phone]   Fax:  [phone]  Occupational Therapy Treatment  Patient Details  Name: Michelle Randall MRN: [id number] Date of Birth: 1996-10-23 No Data Recorded  Encounter Date: 06/09/2016      OT End of Session - 06/09/16 1716    Visit Number 32   Number of Visits 70   Date for OT Re-Evaluation 07/09/16   Authorization Type medicaid visit 11 of 18   OT Start Time 1600   OT Stop Time 1645   OT Time Calculation (min) 45 min   Activity Tolerance Patient tolerated treatment well   Behavior During Therapy [facility] for tasks assessed/performed      No past medical history on file.  No past surgical history on file.  There were no vitals filed for this visit.      Subjective Assessment - 06/09/16 1700    Subjective  "I forgot to tell you I fell a couple of weeks ago and hurt my left shoulder muscle"   Patient is accompained by: Family member   Patient Stated Goals wants to be independent with all tasks so she will be able to go away to college.   Currently in Pain? Yes   Pain Score 5    Pain Location Shoulder   Pain Orientation Left   Pain Descriptors / Indicators Discomfort   Pain Type Acute pain   Pain Onset 1 to 4 weeks ago   Pain Frequency Occasional   Aggravating Factors  only hurts when moving left shoulder to lift or pull    Pain Relieving Factors Icy hot cream, ibuprofen. Rec doing ice massage by putting a Dixie cup in freezer with water and peel back paper and move ice in circles for about 8-10 minutes 1-2 times a day.   Effect of Pain on Daily Activities limits lifting and pulling with left shoulder   Multiple Pain Sites No                      OT Treatments/Exercises (OP) - 06/09/16 1704      ADLs   UB Dressing Practiced new technique for donning and doffing bra in seated position.  Pt has difficulty with clasping and  unclasping bra behind her back but is able to complete closure in front and turning to back and then placing shoulders in strap and adjust as needed.  Min assist and cues needed to complete first time but only supervision second time.  Rec continued practice at home and review in therapy sessions.   LB Dressing Sitting at edge of mat, patient was able to don and doff shoes independently and given elastic shoe laces to try with other pair of tennis shoes to see if shoes stayed tighter to prevent falls when ambulating.  Her current laces appear loose and thinning.  Practiced donning pants over AFOs and leggings sitting at edge of mat with no assistance but requires extra time.  Patient to bring in skinny jeans to practice at next session since these are more difficult and what she tends to wear to school.        Neurological Re-education Exercises   Other Exercises 1 Patient seen to practice technique to pick up backpack with 5 pounds of weight from floor to mat and behind right shoulder to simulate placing of backpack over handle of electric wheelchair.  Patient able to complete 2 sets of 10  with cues for technique.  Discussed placing backpack on chairs and tables as much as possible vs floor to make picking it up safer and less energy consuming at college.                 OT Education - 06/09/16 1715    Education provided Yes   Education Details Rec using ice massage to L shoulder rotator cuff using Dixie cup in freezer method. Educated in new tech to [place] and rec continued use of this to increase efficiency with UB dressing.   Person(s) Educated Patient   Methods Explanation   Comprehension Verbalized understanding             OT Long Term Goals - 06/09/16 1722      OT LONG TERM GOAL #1   Title Patient will demonstrate methods of picking up her bookbag and placing it onto the back of her chair with modified independence to transport items to class.   Baseline requires  assistance from others to complete now but will need to be independent when at college in the fall .   Time 6   Period Months   Status On-going     OT LONG TERM GOAL #2   Title Patient will be able to make her bed with modified independence.   Baseline she continues to have difficulty with balance and completing this task, moderate assist.   Time 6   Period Months   Status On-going     OT LONG TERM GOAL #3   Title Patient will demonstrate the ability to perform vacuuming of one room with modified independence using light weight vacuum.   Baseline still requires max assist   Time 6   Period Months   Status On-going     OT LONG TERM GOAL #5   Title Patient will demonstrate donning and doffing her bra with modified independence.   Baseline max assist   Time 12   Period Weeks   Status Partially Met     OT LONG TERM GOAL #6   Title Will now be able to go through a door that swings out using motorized wheel chair.   Baseline difficulty depending on type of door and set up of the area, at times requires max assistance depending on the weight of the door.   Time 6   Period Months   Status On-going     OT LONG TERM GOAL  #11   TITLE Will be able to squeeze tooth paste out of the tube when it is less than half full.   Baseline still requires min assist if tube is less than 1/2 full.   Time 6   Period Months   Status Partially Met     OT LONG TERM GOAL  #12   TITLE Patient will demonstrate managing wheelchair rain poncho in inclement weather with modified independence.   Baseline crutch tips are difficult for anyone to take them off, goal deferred.   Time 6   Period Months   Status On-going     OT LONG TERM GOAL  #13   TITLE Patient will be able to complete 2 1/2 minutes straight with left shoulder flexion simulating driving with a stearing wheel ball to be able to drive to college.    Baseline can do 30 second of shoulder flexion with 2 1/2 before fatigue and can only drive 15  min before right shoulder fatigues.   Time 6   Period Months  Status On-going     OT LONG TERM GOAL  #14   TITLE Will be able to open taped boxes (for moving to college) using a key or knife.   Baseline ongoing techniques explored with adaptive devices to assist.   Time 6   Period Months   Status On-going     OT LONG TERM GOAL  #15   TITLE Patient will be able to obtain food tray in the cafeteria from a wheelchair level and take to the table without spilling with modified independence.    Baseline max assist   Time 6   Period Months   Status On-going               Plan - 06/09/16 1717    Clinical Impression Statement Pt worked on different dressing techniques today for donning and doffing bra and given elastic shoe laces for shoes since her current laces are thinning and may pose a risk for falling.  She is making progress in LB dressing without any AD sitting edge of mat but continues to work on increasing speed to complete dressing.  She practiced picking up and moving weighted backpack today to simulate task of moving backpack with laptop and books up and over handle bar of electic wheelchair.  She tolerate 2 sets of 10 using 5 pounds of weight today in backpack.    Rehab Potential Good   OT Frequency 2x / week   OT Duration 12 weeks   OT Treatment/Interventions Self-care/ADL training;Therapeutic exercise;Therapeutic exercises;Therapeutic activities   OT Home Exercise Plan try elastic shoe laces and new tech for bra donning and doffing   Consulted and Agree with Plan of Care Patient      Patient will benefit from skilled therapeutic intervention in order to improve the following deficits and impairments:  Impaired UE functional use, Difficulty walking, Decreased strength, Decreased coordination, Decreased endurance, Decreased balance  Visit Diagnosis: Muscle weakness (generalized)  Lack of coordination  Self-care deficit for dressing and grooming    Problem  List There are no active problems to display for this patient.  Chrys Racer, OTR/L ascom [phone]  06/09/2016, 5:28 PM  [place] MAIN [facility] SERVICES [address] [place], [place], [address] Phone: [phone]   Fax:  [phone]  Name: Michelle Randall MRN: [id number] Date of Birth: 08-08-1996

## 2016-06-11 ENCOUNTER — Ambulatory Visit: Payer: Medicaid Other | Admitting: Occupational Therapy

## 2016-06-11 DIAGNOSIS — M6281 Muscle weakness (generalized): Secondary | ICD-10-CM

## 2016-06-11 NOTE — Patient Instructions (Addendum)
OT TREATMENT    Therapeutic Exercise:  3# dumbbell ex. for shoulder flexion, extension, abduction, elbow flexion, and extension,  3# for forearm supination/pronation, wrist flexion/extension, and radial deviation. Pt. requires rest breaks and verbal cues for proper technique. Pt. Worked on gripping, lifting a 7-8# backpack, and placing it over the back of a chair.   Selfcare:  Pt. Education was provided about self-care tasks, and adaptive techniques to improve independent and efficiency of the tasks. Pt. Is now able to donn her pants independently, however continues to have difficulty with donning form fitting jeans over her brace. Pt. Education was provided about adaptive techniques, and inserting zippers along the seam at the ankle of the pants to increase the width when donning. Pt. Is able to donn bra independently, and efficiently using the technique reviewed last session.

## 2016-06-11 NOTE — Therapy (Signed)
[place] MAIN [facility] SERVICES [address] [place], [place], [address] Phone: [phone]   Fax:  [phone]  Occupational Therapy Treatment  Patient Details  Name: Michelle Randall MRN: [id number] Date of Birth: January 26, 1996 No Data Recorded  Encounter Date: 06/11/2016      OT End of Session - 06/11/16 1027    Visit Number 33   Number of Visits 59   Date for OT Re-Evaluation 07/09/16   Authorization Type medicaid visit 12 of 19   OT Start Time 0930   OT Stop Time 1015   OT Time Calculation (min) 45 min   Activity Tolerance Patient tolerated treatment well   Behavior During Therapy [facility] for tasks assessed/performed      No past medical history on file.  No past surgical history on file.  There were no vitals filed for this visit.      Subjective Assessment - 06/11/16 1022    Subjective  Pt. reports no pain. Reports her arm is feeling better.   Patient is accompained by: Family member   Patient Stated Goals wants to be independent with all tasks so she will be able to go away to college.      OT TREATMENT    Therapeutic Exercise:  3# dumbbell ex. for shoulder flexion, extension, abduction, elbow flexion, and extension,  3# for forearm supination/pronation, wrist flexion/extension, and radial deviation. Pt. requires rest breaks and verbal cues for proper technique. Pt. Worked on gripping, lifting a 7-8# backpack, and placing it over the back of a chair.   Selfcare:  Pt. Education was provided about self-care tasks, and adaptive techniques to improve independent and efficiency of the tasks. Pt. Is now able to donn her pants independently, however continues to have difficulty with donning form fitting jeans over her brace. Pt. Education was provided about adaptive techniques, and inserting zippers along the seam at the ankle of the pants to increase the width when donning. Pt. Is able to donn bra independently, and efficiently using  the technique reviewed last session.                          OT Education - 06/11/16 1026    Education provided Yes   Education Details Self-dressing skills, backpack management, and UE ther. ex.   Person(s) Educated Patient   Methods Explanation;Demonstration   Comprehension Verbalized understanding             OT Long Term Goals - 06/09/16 1722      OT LONG TERM GOAL #1   Title Patient will demonstrate methods of picking up her bookbag and placing it onto the back of her chair with modified independence to transport items to class.   Baseline requires assistance from others to complete now but will need to be independent when at college in the fall .   Time 6   Period Months   Status On-going     OT LONG TERM GOAL #2   Title Patient will be able to make her bed with modified independence.   Baseline she continues to have difficulty with balance and completing this task, moderate assist.   Time 6   Period Months   Status On-going     OT LONG TERM GOAL #3   Title Patient will demonstrate the ability to perform vacuuming of one room with modified independence using light weight vacuum.   Baseline still requires max assist   Time  6   Period Months   Status On-going     OT LONG TERM GOAL #5   Title Patient will demonstrate donning and doffing her bra with modified independence.   Baseline max assist   Time 12   Period Weeks   Status Partially Met     OT LONG TERM GOAL #6   Title Will now be able to go through a door that swings out using motorized wheel chair.   Baseline difficulty depending on type of door and set up of the area, at times requires max assistance depending on the weight of the door.   Time 6   Period Months   Status On-going     OT LONG TERM GOAL  #11   TITLE Will be able to squeeze tooth paste out of the tube when it is less than half full.   Baseline still requires min assist if tube is less than 1/2 full.   Time 6    Period Months   Status Partially Met     OT LONG TERM GOAL  #12   TITLE Patient will demonstrate managing wheelchair rain poncho in inclement weather with modified independence.   Baseline crutch tips are difficult for anyone to take them off, goal deferred.   Time 6   Period Months   Status On-going     OT LONG TERM GOAL  #13   TITLE Patient will be able to complete 2 1/2 minutes straight with left shoulder flexion simulating driving with a stearing wheel ball to be able to drive to college.    Baseline can do 30 second of shoulder flexion with 2 1/2 before fatigue and can only drive 15 min before right shoulder fatigues.   Time 6   Period Months   Status On-going     OT LONG TERM GOAL  #14   TITLE Will be able to open taped boxes (for moving to college) using a key or knife.   Baseline ongoing techniques explored with adaptive devices to assist.   Time 6   Period Months   Status On-going     OT LONG TERM GOAL  #15   TITLE Patient will be able to obtain food tray in the cafeteria from a wheelchair level and take to the table without spilling with modified independence.    Baseline max assist   Time 6   Period Months   Status On-going               Plan - 06/11/16 1028    Clinical Impression Statement Pt. education was provided about self-dressing skills, and adaptive techniques. Pt. reports she is now able to donn bra independently, and efficiently. Pt. reports she is able to donn pants more efficiently, and independently, however continues to struggle with form fitting jeans over the brace. Pt. education was provided about inserting small zippers along the seam near the ankle of the jeans to assist simplified donning over the brace. Pt. continues to work on managing a full backpack over a chair. Pt. father is planning to bring in the w/c for Mondays session to work on wheelchair related functional tasks.    Rehab Potential Good   OT Frequency 2x / week   OT Duration 12  weeks   OT Treatment/Interventions Self-care/ADL training;Therapeutic exercise;Therapeutic exercises;Therapeutic activities   Consulted and Agree with Plan of Care Patient   Family Member Consulted mom      Patient will benefit from skilled therapeutic intervention in order to  improve the following deficits and impairments:  Impaired UE functional use, Difficulty walking, Decreased strength, Decreased coordination, Decreased endurance, Decreased balance  Visit Diagnosis: Muscle weakness (generalized)    Problem List There are no active problems to display for this patient.  Harrel Carina, MS, OTR/L  Harrel Carina 06/11/2016, 11:45 AM  [place] MAIN [facility] SERVICES [address] [place], [place], [address] Phone: [phone]   Fax:  [phone]  Name: Michelle Randall MRN: [id number] Date of Birth: 22-Oct-1996

## 2016-06-14 ENCOUNTER — Encounter: Payer: Self-pay | Admitting: Occupational Therapy

## 2016-06-14 ENCOUNTER — Ambulatory Visit: Payer: Medicaid Other | Admitting: Occupational Therapy

## 2016-06-14 ENCOUNTER — Encounter: Payer: Medicaid Other | Admitting: Occupational Therapy

## 2016-06-14 DIAGNOSIS — R279 Unspecified lack of coordination: Secondary | ICD-10-CM

## 2016-06-14 DIAGNOSIS — M6281 Muscle weakness (generalized): Secondary | ICD-10-CM

## 2016-06-14 DIAGNOSIS — R460 Very low level of personal hygiene: Secondary | ICD-10-CM

## 2016-06-14 DIAGNOSIS — Z741 Need for assistance with personal care: Secondary | ICD-10-CM

## 2016-06-14 NOTE — Therapy (Signed)
[place] MAIN [facility] SERVICES [address] [place], [place], [address] Phone: [phone]   Fax:  [phone]  Occupational Therapy Treatment  Patient Details  Name: Michelle Randall MRN: [id number] Date of Birth: [birth year] No Data Recorded  Encounter Date: 06/14/2016      OT End of Session - 06/14/16 1556    Visit Number 34   Number of Visits 9   Date for OT Re-Evaluation 07/09/16   Authorization Type medicaid visit 50 of 33   OT Start Time 1410   OT Stop Time 1500   OT Time Calculation (min) 50 min      History reviewed. No pertinent past medical history.  No past surgical history on file.  There were no vitals filed for this visit.      Subjective Assessment - 06/14/16 1536    Subjective  "I went camping this past weekend and had a great time but my L shoulder is worse."   Patient is accompained by: Family member   Patient Stated Goals wants to be independent with all tasks so she will be able to go away to college.   Currently in Pain? Yes   Pain Score 6    Pain Location Shoulder   Pain Orientation Left   Pain Descriptors / Indicators Discomfort   Pain Type Acute pain   Pain Onset In the past 7 days   Pain Frequency Occasional   Aggravating Factors  when pulling, twisting and pushing with L arm   Pain Relieving Factors icing as directed last session for 15 minutes, taking ibuprofen    Effect of Pain on Daily Activities limits lifting and pulling with L shoulder   Multiple Pain Sites No                      OT Treatments/Exercises (OP) - 06/14/16 1539      ADLs   UB Dressing Reviewed new technique for donning and doffing bra and patient indicated it is working well and is faster to get bra on and off at home now.   LB Dressing Patient is using elastic shoe laces in her shoes and feels it is helping make it easier to get braces in and  out of shoes with them.   Functional Mobility Practiced navigating  in electric wheelchair to and from cafeteria to practice reaching, problem solving and carrying items on tray on her lap while using controls on electric wheelchair.  She needed cues to find food tray, where to place plastic containers and silverware when done.  Cues needed to position wheelchair by salad bar to reach items but had good problem solving skills when cafeteria worker needed to get to lettuce holder and she needed to move her electric wheelchair to allow him through.  She was able to carry 3 small containers of ketchup, mustard and mayo after filling them and placing on tray. She was able to balance these as well as container of watermelon and a small drink on tray without spilling.  She was not able to hold drink while holding tray and placed drink in center of tray close to her.  Practiced navigating through tables and chairs to find open end of table for her to pull up to.  Min cues for making a larger turn so she did not need to back up and then go forward to position herself at table.      ADL Comments Patient able to lift backpack  with 5 pounds of weight today to pick up from floor and table to bring up and over left side of handle bar on electric wheelchair 4 times.  She was able to safely place forearm crutches in back of electric wheelchair into holder independently.  Rec she cut straps or safety pin straps up to ensure they do not get entangled in wheels of wheelchair.  Discussed this with her father as well.                 OT Education - 06/14/16 1554    Education provided Yes   Education Details Rec checking straps on backpack for college and cut straps if needed so they don't get entangled in wheels of electric wheelchair. Also rec patient consider bringing her own water bottle or drink in insulated thermos if carrying a drink on tray is difficult.    Person(s) Educated Patient;Parent(s)   Methods Explanation;Demonstration   Comprehension Verbalized understanding              OT Long Term Goals - 06/14/16 1603      OT LONG TERM GOAL #1   Title Patient will demonstrate methods of picking up her bookbag and placing it onto the back of her chair with modified independence to transport items to class.   Baseline requires assistance from others to complete now but will need to be independent when at college in the fall .   Time 6   Period Months   Status Partially Met     OT LONG TERM GOAL #2   Title Patient will be able to make her bed with modified independence.   Baseline she continues to have difficulty with balance and completing this task, moderate assist.   Time 6   Period Months   Status On-going     OT LONG TERM GOAL #3   Title Patient will demonstrate the ability to perform vacuuming of one room with modified independence using light weight vacuum.   Baseline still requires max assist   Time 6   Period Months   Status On-going     OT LONG TERM GOAL #5   Title Patient will demonstrate donning and doffing her bra with modified independence.   Baseline max assist   Time 12   Period Weeks   Status Achieved     OT LONG TERM GOAL #6   Title Will now be able to go through a door that swings out using motorized wheel chair.   Baseline difficulty depending on type of door and set up of the area, at times requires max assistance depending on the weight of the door.   Time 6   Period Months   Status On-going     OT LONG TERM GOAL  #9   Baseline Patient will be able to braid her own hair before college   Time 6   Period Months   Status On-going     OT LONG TERM GOAL  #10   TITLE Wll be able to wash her own hair before starting college   Baseline mom still has to help with more than 20% of task now down from 50%.     Time 6   Period Months   Status On-going     OT LONG TERM GOAL  #11   TITLE Will be able to squeeze tooth paste out of the tube when it is less than half full.   Baseline still requires min assist if tube is less  than 1/2 full.   Time 6   Period Months   Status Partially Met     OT LONG TERM GOAL  #12   TITLE Patient will demonstrate managing wheelchair rain poncho in inclement weather with modified independence.   Baseline crutch tips are difficult for anyone to take them off, goal deferred.   Time 6   Period Months   Status On-going     OT LONG TERM GOAL  #13   TITLE Patient will be able to complete 2 1/2 minutes straight with left shoulder flexion simulating driving with a stearing wheel ball to be able to drive to college.    Baseline can do 30 second of shoulder flexion with 2 1/2 before fatigue and can only drive 15 min before right shoulder fatigues.   Time 6   Period Months   Status On-going     OT LONG TERM GOAL  #14   TITLE Will be able to open taped boxes (for moving to college) using a key or knife.   Baseline ongoing techniques explored with adaptive devices to assist.   Time 6   Period Months   Status On-going     OT LONG TERM GOAL  #15   TITLE Patient will be able to obtain food tray in the cafeteria from a wheelchair level and take to the table without spilling with modified independence.    Baseline max assist   Time 6   Period Months   Status Partially Met               Plan - 06/14/16 1557    Clinical Impression Statement Pt's father brought electric wheelchair in for session to practice navigating in cafeteria in prep for college and practice putting backpack on back of wheelchair.  She did very well with positioning wheelchair in cafeteria but there were very few people there since it was not during a meal time.  Rec trying navigation again when it is busy to assess problem solving and decision making around other people.  She did well with reaching various items and carrying on try on her lap without spilling anything.  Min cues for locating where trash went and placement of napkins, silverware, etc which were new to her.  Discussed assessing strap length on  her backpack to ensure they are not too long since they could get entangled in wheelchair.  She is making good progress with lifting and moving backpack on and off electric wheelchair with more weight.  Need to practice donning skinny jeans in upcoming sessions.    Rehab Potential Good   OT Frequency 2x / week   OT Duration 12 weeks   OT Treatment/Interventions Self-care/ADL training;Therapeutic exercise;Therapeutic exercises;Therapeutic activities   Consulted and Agree with Plan of Care Patient;Family member/caregiver   Family Member Consulted father      Patient will benefit from skilled therapeutic intervention in order to improve the following deficits and impairments:  Impaired UE functional use, Difficulty walking, Decreased strength, Decreased coordination, Decreased endurance, Decreased balance  Visit Diagnosis: Muscle weakness (generalized)  Lack of coordination  Self-care deficit for dressing and grooming    Problem List There are no active problems to display for this patient.  Chrys Racer, OTR/L ascom [phone]  06/14/2016, 4:08 PM  [place] MAIN [facility] SERVICES [address] [place], [place], [address] Phone: [phone]   Fax:  [phone]  Name: Michelle Randall MRN: [id number] Date of Birth: 05-Jul-1996

## 2016-06-16 ENCOUNTER — Encounter: Payer: Self-pay | Admitting: Physical Therapy

## 2016-06-16 ENCOUNTER — Encounter: Payer: Medicaid Other | Admitting: Occupational Therapy

## 2016-06-16 ENCOUNTER — Ambulatory Visit: Payer: Medicaid Other | Admitting: Occupational Therapy

## 2016-06-16 ENCOUNTER — Ambulatory Visit: Payer: Self-pay | Admitting: Physical Therapy

## 2016-06-16 ENCOUNTER — Ambulatory Visit: Payer: Medicaid Other | Attending: Pediatrics | Admitting: Physical Therapy

## 2016-06-16 DIAGNOSIS — R278 Other lack of coordination: Secondary | ICD-10-CM

## 2016-06-16 DIAGNOSIS — M6281 Muscle weakness (generalized): Secondary | ICD-10-CM | POA: Diagnosis present

## 2016-06-16 DIAGNOSIS — R262 Difficulty in walking, not elsewhere classified: Secondary | ICD-10-CM | POA: Diagnosis present

## 2016-06-16 NOTE — Therapy (Signed)
[place] [facility] REHAB SERVICES [address] [place], [place], [address] Phone: [phone]   Fax:  [phone]  Occupational Therapy Treatment  Patient Details  Name: Michelle Randall MRN: [id number] Date of Birth: 10/15/1996 No Data Recorded  Encounter Date: 06/16/2016      OT End of Session - 06/16/16 1444    Visit Number 35   Number of Visits 70   Date for OT Re-Evaluation 07/09/16   Authorization Type medicaid visit 14 of 48   OT Start Time 1345   OT Stop Time 1430   OT Time Calculation (min) 45 min   Activity Tolerance Patient tolerated treatment well   Behavior During Therapy WFL for tasks assessed/performed      No past medical history on file.  No past surgical history on file.  There were no vitals filed for this visit.      Subjective Assessment - 06/16/16 1403    Subjective  Pt. reports she is having back pain from camping this past weekend.   Patient is accompained by: Family member   Currently in Pain? No/denies   Pain Score 6    Pain Location Shoulder   Pain Orientation Left   Pain Descriptors / Indicators Discomfort      OT TREATMENT    Neuro muscular re-education:  Pt. worked on tasks to sustain lateral pinch on resistive tweezers while grasping and moving 2" toothpick sticks from a horizontal flat position to a vertical position in order to place it in the holder. Pt. was able to sustain grasp while positioning and extending the wrist/hand in the necessary alignment needed to place the stick through the top of the holder. Pt. Worked on grasping coins from a tabletop surface, placing them into a resistive container, and pushing them through the slot while isolating his 2nd digit. Pt. performed FMC skills training to improve speed and dexterity needed for ADL tasks and writing. Pt. demonstrated grasping 1 inch sticks,  inch cylindrical collars, and  inch flat washers on the [employer] pegboard. Pt. performed grasping  each item with her 2nd digit and thumb, and storing them in the palm. Pt. presented with difficulty storing  inch objects at a time in the palmar aspect of the hand. Pt. Worked on the 5 min. typing test at 34 wpm.   Therapeutic Exercise:  Pt. performed gross gripping with grip strengthener. Pt. worked on sustaining grip while placing them in a container set at the tabletop beside the board. Pt. Worked with the gripper set in the 3rd resistive slot, and upgraded to the 4th resistive slot.                             OT Education - 06/16/16 1508    Education provided Yes   Person(s) Educated Patient   Methods Explanation;Demonstration   Comprehension Verbalized understanding;Returned demonstration             OT Long Term Goals - 06/14/16 1603      OT LONG TERM GOAL #1   Title Patient will demonstrate methods of picking up her bookbag and placing it onto the back of her chair with modified independence to transport items to class.   Baseline requires assistance from others to complete now but will need to be independent when at college in the fall .   Time 6   Period Months   Status Partially Met       OT LONG TERM GOAL #2   Title Patient will be able to make her bed with modified independence.   Baseline she continues to have difficulty with balance and completing this task, moderate assist.   Time 6   Period Months   Status On-going     OT LONG TERM GOAL #3   Title Patient will demonstrate the ability to perform vacuuming of one room with modified independence using light weight vacuum.   Baseline still requires max assist   Time 6   Period Months   Status On-going     OT LONG TERM GOAL #5   Title Patient will demonstrate donning and doffing her bra with modified independence.   Baseline max assist   Time 12   Period Weeks   Status Achieved     OT LONG TERM GOAL #6   Title Will now be able to go through a door that swings out using motorized  wheel chair.   Baseline difficulty depending on type of door and set up of the area, at times requires max assistance depending on the weight of the door.   Time 6   Period Months   Status On-going     OT LONG TERM GOAL  #9   Baseline Patient will be able to braid her own hair before college   Time 6   Period Months   Status On-going     OT LONG TERM GOAL  #10   TITLE Wll be able to wash her own hair before starting college   Baseline mom still has to help with more than 20% of task now down from 50%.     Time 6   Period Months   Status On-going     OT LONG TERM GOAL  #11   TITLE Will be able to squeeze tooth paste out of the tube when it is less than half full.   Baseline still requires min assist if tube is less than 1/2 full.   Time 6   Period Months   Status Partially Met     OT LONG TERM GOAL  #12   TITLE Patient will demonstrate managing wheelchair rain poncho in inclement weather with modified independence.   Baseline crutch tips are difficult for anyone to take them off, goal deferred.   Time 6   Period Months   Status On-going     OT LONG TERM GOAL  #13   TITLE Patient will be able to complete 2 1/2 minutes straight with left shoulder flexion simulating driving with a stearing wheel ball to be able to drive to college.    Baseline can do 30 second of shoulder flexion with 2 1/2 before fatigue and can only drive 15 min before right shoulder fatigues.   Time 6   Period Months   Status On-going     OT LONG TERM GOAL  #14   TITLE Will be able to open taped boxes (for moving to college) using a key or knife.   Baseline ongoing techniques explored with adaptive devices to assist.   Time 6   Period Months   Status On-going     OT LONG TERM GOAL  #15   TITLE Patient will be able to obtain food tray in the cafeteria from a wheelchair level and take to the table without spilling with modified independence.    Baseline max assist   Time 6   Period Months   Status  Partially Met                 Plan - 06/16/16 1445    Clinical Impression Statement Pt. is having back/shoulder pain which she reinjured this past weekend. Pt. reports needing to rest her arm and back. Pt. is making progress overall with improving grip strength, FMC skills, and typing speed by increasing words per minute.    Rehab Potential Good   OT Frequency 2x / week   OT Duration 12 weeks   OT Treatment/Interventions Self-care/ADL training;Therapeutic exercise;Therapeutic exercises;Therapeutic activities   Consulted and Agree with Plan of Care Patient;Family member/caregiver   Family Member Consulted father      Patient will benefit from skilled therapeutic intervention in order to improve the following deficits and impairments:  Impaired UE functional use, Difficulty walking, Decreased strength, Decreased coordination, Decreased endurance, Decreased balance  Visit Diagnosis: Muscle weakness (generalized)  Other lack of coordination    Problem List There are no active problems to display for this patient.  Elaine Jagentenfl, MS, OTR/L  Elaine Jagentenfl 06/16/2016, 3:09 PM  [place] [facility] MAIN REHAB SERVICES [address] [place], [place], [address] Phone: [phone]   Fax:  [phone]  Name: Michelle Randall MRN: [id number] Date of Birth: 12/30/1995  

## 2016-06-16 NOTE — Therapy (Signed)
[place] [facility] MAIN [facility] SERVICES [address] [place], [place], [address] Phone: [phone]   Fax:  [phone]  Physical Therapy Treatment  Patient Details  Name: JOBANA GRUSZKA MRN: [id number] Date of Birth: Apr 05, 1996 No Data Recorded  Encounter Date: 06/16/2016      PT End of Session - 06/16/16 1344    Visit Number 47   Number of Visits 44   Date for PT Re-Evaluation 05/19/16   Authorization Type medicaid   PT Start Time 1306   PT Stop Time 1345   PT Time Calculation (min) 39 min   Equipment Utilized During Treatment Gait belt   Activity Tolerance Patient tolerated treatment well   Behavior During Therapy [facility] for tasks assessed/performed      History reviewed. No pertinent past medical history.  History reviewed. No pertinent surgical history.  There were no vitals filed for this visit.      Subjective Assessment - 06/16/16 1341    Subjective Pt is doing well. She is excited about starting college.   Patient is accompained by: Family member   Limitations Walking   Patient Stated Goals Patient wants to improve her core strength.    Currently in Pain? No/denies      Neuro re-education:  Walking outdoors on uneven surfaces (grass, concrete, hills) >400 ft with B loft strand crutches and CGA.  Side stepping in grass R and L with CGA and B loft strand crutches. Agility ladder on grass and unlevel incline: F/B and L/R, R/L x2 laps each with loft strand crutches and CGA.  Difficulty with sideways and lifting foot high enough at time. But able to correct. Min cues for heel strike, postural correction, increased step length for improved stability.  Therapeutic exercise:  Leg press 120 lbs x 20 x 2 sets sit <> stand with no UE support, x15   Cues for proper technique of exercises to target appropriate muscles and slow eccentric contractions. Cues for core contraction for increased  stability.                           PT Education - 06/16/16 1342    Education provided Yes   Education Details postural correction   Person(s) Educated Patient   Methods Explanation;Demonstration   Comprehension Verbalized understanding;Returned demonstration             PT Long Term Goals - 05/26/16 1308      PT LONG TERM GOAL #1   Title Patient will improve Dynamic Gait Index (DGI) score to > 19/24 for low falls risk regarding dynamic walking tasks    Time 12   Period Weeks   Status On-going     PT LONG TERM GOAL #2   Title Patient will increase Berg Balance score by > 6 points to demonstrate decreased fall risk during functional activities    Time 12   Status On-going     PT LONG TERM GOAL #3   Title Patient will be able to transfer in and out of a large car with Patient will be able to transfer in and out of a high seated car with CGA    Time 12   Period Weeks   Status On-going     PT LONG TERM GOAL #4   Title Patient will complete a TUG test in < 12 seconds for independent mobility and decreased fall risk    Time 12   Period Weeks  Status On-going     PT LONG TERM GOAL #6   Title Patient will improve gait speed to > 1.2 m/s with least restrictive assistive device to return to normal walking speed    Time 12   Period Weeks   Status On-going     PT LONG TERM GOAL #7   Title Patient (< 2 years old) will complete five times sit to stand test in < 10 seconds indicating an increased LE strength and improved balance    Baseline Completed 05/07/2015 (9.85 seconds)      PT LONG TERM GOAL #8   Title Patient will be able to ambulate on inclines and grass independenlty with LRAD   Period Weeks   Status New     PT LONG TERM GOAL  #9   TITLE Patient will be abe to transfer from low chair or stool wihtout UE support independently   Period Weeks   Status On-going     PT LONG TERM GOAL  #10   TITLE Patient will be able to transfer from the  floor to standing independently with use of LRAD   Time 12   Period Weeks   Status On-going               Plan - 06/16/16 1533    Clinical Impression Statement Pt gradually imporoving in mobility and strength indicated by ability to perform ambulation outdoors and higher level balance activities. She continues to have difficulty with step height, postural and LE strength. She will benefit from progressed gait and strengthening to further progress towards goals.   Rehab Potential Good   Clinical Impairments Affecting Rehab Potential weakness and decreased standing balance   PT Frequency 1x / week   PT Duration 12 weeks   PT Treatment/Interventions Therapeutic exercise;Therapeutic activities;Gait training;Balance training;Stair training;DME Instruction;Neuromuscular re-education;Patient/family education   PT Next Visit Plan progress LE, core strengthening and balance   PT Home Exercise Plan Continued from previous sessions.    Consulted and Agree with Plan of Care Patient      Patient will benefit from skilled therapeutic intervention in order to improve the following deficits and impairments:  Abnormal gait, Decreased balance, Decreased endurance, Difficulty walking, Decreased strength  Visit Diagnosis: Muscle weakness (generalized)  Difficulty in walking, not elsewhere classified     Problem List There are no active problems to display for this patient.   Reyhan Moronta Lucillie Garfinkel, PT, DPT  06/16/16, 3:38 PM [phone]  [facility] MAIN [facility] SERVICES [address] [place], [place], [address] Phone: [phone]   Fax:  [phone]  Name: HARUMI YAMIN MRN: [id number] Date of Birth: [birth year]

## 2016-06-16 NOTE — Patient Instructions (Signed)
OT TREATMENT    Neuro muscular re-education:  Pt. worked on tasks to sustain lateral pinch on resistive tweezers while grasping and moving 2" toothpick sticks from a horizontal flat position to a vertical position in order to place it in the holder. Pt. was able to sustain grasp while positioning and extending the wrist/hand in the necessary alignment needed to place the stick through the top of the holder. Pt. Worked on grasping coins from a tabletop surface, placing them into a resistive container, and pushing them through the slot while isolating his 2nd digit. A resistive mat was placed under coins to aide in manipulating the coins and prevent sliding when picking them up. Pt. performed [facility] skills training to improve speed and dexterity needed for ADL tasks and writing. Pt. demonstrated grasping 1 inch sticks,  inch cylindrical collars, and  inch flat washers on the [place]urdue pegboard. Pt. performed grasping each item with her 2nd digit and thumb, and storing them in the palm. Pt. presented with difficulty storing  inch objects at a time in the palmar aspect of the hand. Pt. Worked on the 5 min. typing test at 34 wpm.   Therapeutic Exercise:  Pt. performed gross gripping with grip strengthener. Pt. worked on sustaining grip while placing them in a container set at the tabletop beside the board. Pt. Worked with the gripper set in the 3rd resistive slot, and upgraded to the 4th resistive slot.

## 2016-06-16 NOTE — Therapy (Signed)
[place] MAIN [facility] SERVICES [address] [place], [place], [address] Phone: [phone]   Fax:  [phone]  Occupational Therapy Treatment  Patient Details  Name: Michelle Randall MRN: [id number] Date of Birth: October 23, 1996 No Data Recorded  Encounter Date: 06/16/2016      OT End of Session - 06/16/16 1444    Visit Number 35   Number of Visits 70   Date for OT Re-Evaluation 07/09/16   Authorization Type medicaid visit 50 of 38   OT Start Time 1345   OT Stop Time 1430   OT Time Calculation (min) 45 min   Activity Tolerance Patient tolerated treatment well   Behavior During Therapy [facility] for tasks assessed/performed      No past medical history on file.  No past surgical history on file.  There were no vitals filed for this visit.      Subjective Assessment - 06/16/16 1403    Subjective  Pt. reports she is having back pain from camping this past weekend.   Patient is accompained by: Family member   Currently in Pain? No/denies   Pain Score 6    Pain Location Shoulder   Pain Orientation Left   Pain Descriptors / Indicators Discomfort                              OT Education - 06/16/16 1444    Education provided Yes   Person(s) Educated Patient   Methods Explanation;Demonstration   Comprehension Verbalized understanding;Returned demonstration             OT Long Term Goals - 06/14/16 1603      OT LONG TERM GOAL #1   Title Patient will demonstrate methods of picking up her bookbag and placing it onto the back of her chair with modified independence to transport items to class.   Baseline requires assistance from others to complete now but will need to be independent when at college in the fall .   Time 6   Period Months   Status Partially Met     OT LONG TERM GOAL #2   Title Patient will be able to make her bed with modified independence.   Baseline she continues to have difficulty with  balance and completing this task, moderate assist.   Time 6   Period Months   Status On-going     OT LONG TERM GOAL #3   Title Patient will demonstrate the ability to perform vacuuming of one room with modified independence using light weight vacuum.   Baseline still requires max assist   Time 6   Period Months   Status On-going     OT LONG TERM GOAL #5   Title Patient will demonstrate donning and doffing her bra with modified independence.   Baseline max assist   Time 12   Period Weeks   Status Achieved     OT LONG TERM GOAL #6   Title Will now be able to go through a door that swings out using motorized wheel chair.   Baseline difficulty depending on type of door and set up of the area, at times requires max assistance depending on the weight of the door.   Time 6   Period Months   Status On-going     OT LONG TERM GOAL  #9   Baseline Patient will be able to braid her own hair before college   Time  6   Period Months   Status On-going     OT LONG TERM GOAL  #10   TITLE Wll be able to wash her own hair before starting college   Baseline mom still has to help with more than 20% of task now down from 50%.     Time 6   Period Months   Status On-going     OT LONG TERM GOAL  #11   TITLE Will be able to squeeze tooth paste out of the tube when it is less than half full.   Baseline still requires min assist if tube is less than 1/2 full.   Time 6   Period Months   Status Partially Met     OT LONG TERM GOAL  #12   TITLE Patient will demonstrate managing wheelchair rain poncho in inclement weather with modified independence.   Baseline crutch tips are difficult for anyone to take them off, goal deferred.   Time 6   Period Months   Status On-going     OT LONG TERM GOAL  #13   TITLE Patient will be able to complete 2 1/2 minutes straight with left shoulder flexion simulating driving with a stearing wheel ball to be able to drive to college.    Baseline can do 30 second of  shoulder flexion with 2 1/2 before fatigue and can only drive 15 min before right shoulder fatigues.   Time 6   Period Months   Status On-going     OT LONG TERM GOAL  #14   TITLE Will be able to open taped boxes (for moving to college) using a key or knife.   Baseline ongoing techniques explored with adaptive devices to assist.   Time 6   Period Months   Status On-going     OT LONG TERM GOAL  #15   TITLE Patient will be able to obtain food tray in the cafeteria from a wheelchair level and take to the table without spilling with modified independence.    Baseline max assist   Time 6   Period Months   Status Partially Met               Plan - 06/16/16 1445    Clinical Impression Statement Pt. is having back/shoulder pain which she reinjured this past weekend. Pt. reports needing to rest her arm and back. Pt. is making progress overall with improving grip strength, [place] skills, and typing speed by increasing words per minute.    Rehab Potential Good   OT Frequency 2x / week   OT Duration 12 weeks   OT Treatment/Interventions Self-care/ADL training;Therapeutic exercise;Therapeutic exercises;Therapeutic activities   Consulted and Agree with Plan of Care Patient;Family member/caregiver   Family Member Consulted father      Patient will benefit from skilled therapeutic intervention in order to improve the following deficits and impairments:  Impaired UE functional use, Difficulty walking, Decreased strength, Decreased coordination, Decreased endurance, Decreased balance  Visit Diagnosis: Muscle weakness (generalized)  Other lack of coordination    Problem List There are no active problems to display for this patient.   Harrel Carina 06/16/2016, 3:03 PM  [place] MAIN [facility] SERVICES [address] [place], [place], [address] Phone: [phone]   Fax:  [phone]  Name: Michelle Randall MRN: [id number] Date of Birth:  Mar 30, 1996

## 2016-06-21 ENCOUNTER — Ambulatory Visit: Payer: Medicaid Other | Admitting: Occupational Therapy

## 2016-06-21 DIAGNOSIS — M6281 Muscle weakness (generalized): Secondary | ICD-10-CM

## 2016-06-21 DIAGNOSIS — R278 Other lack of coordination: Secondary | ICD-10-CM

## 2016-06-21 NOTE — Patient Instructions (Addendum)
OT TREATMENT    Neuro muscular re-education:  Pt. Worked on opening various sized bottles, and containers. Pt. Worked on grasping 1" resistive cubes using thumb opposition to the tip of her 2nd digit through thumb.  Therapeutic Exercise:  Pt. worked on reaching with her LUE using the shape tower.Pt. performed gross gripping with grip strengthener. Pt. worked on sustaining grip while grasping pegs and reaching at various heights. Gripper was placed in the  3rd resistive slot with the white resistive spring. Pt. Worked on pinch strengthening in the left hand for lateral, and 3pt. pinch using yellow, red, green, and blue resistive clips. Pt. worked on placing the clips at various vertical and horizontal angles. Tactile and verbal cues were required for eliciting the desired movement. Pt. Worked on opening various sized bottles, and containers. Pt. Worked on grasping 1" resistive cubes with her thumb and 2nd digit.

## 2016-06-21 NOTE — Therapy (Signed)
[place] MAIN [facility] SERVICES [address] [place], [place], [address] Phone: [phone]   Fax:  [phone]  Occupational Therapy Treatment  Patient Details  Name: Michelle Randall MRN: [id number] Date of Birth: [birth year] No Data Recorded  Encounter Date: 06/21/2016      OT End of Session - 06/21/16 1457    Visit Number 36   Number of Visits 41   Date for OT Re-Evaluation 07/09/16   Authorization Type medicaid visit 15 of 38   OT Start Time 1305   OT Stop Time 1345   OT Time Calculation (min) 40 min   Activity Tolerance Patient tolerated treatment well   Behavior During Therapy [facility] for tasks assessed/performed      No past medical history on file.  No past surgical history on file.  There were no vitals filed for this visit.      Subjective Assessment - 06/21/16 1314    Subjective  Pt. reports going to the MD to have her shoulder checked out last week. Pt. reports having had a pulled muscle in her back.   Patient is accompained by: Family member   Pain Score 3       OT TREATMENT    Neuro muscular re-education:  Pt. Worked on opening various sized bottles, and containers. Pt. Worked on grasping 1" resistive cubes using thumb opposition to the tip of her 2nd digit through thumb.  Therapeutic Exercise:  Pt. worked on reaching with her LUE using the shape tower.Pt. performed gross gripping with grip strengthener. Pt. worked on sustaining grip while grasping pegs and reaching at various heights. Gripper was placed in the  3rd resistive slot with the white resistive spring. Pt. Worked on pinch strengthening in the left hand for lateral, and 3pt. pinch using yellow, red, green, and blue resistive clips. Pt. worked on placing the clips at various vertical and horizontal angles. Tactile and verbal cues were required for eliciting the desired movement. Pt. Worked on opening various sized bottles, and containers. Pt. Worked on grasping  1" resistive cubes with her thumb and 2nd digit.                           OT Education - 06/21/16 1457    Education provided Yes   Person(s) Educated Patient   Methods Explanation;Demonstration   Comprehension Verbalized understanding;Returned demonstration             OT Long Term Goals - 06/14/16 1603      OT LONG TERM GOAL #1   Title Patient will demonstrate methods of picking up her bookbag and placing it onto the back of her chair with modified independence to transport items to class.   Baseline requires assistance from others to complete now but will need to be independent when at college in the fall .   Time 6   Period Months   Status Partially Met     OT LONG TERM GOAL #2   Title Patient will be able to make her bed with modified independence.   Baseline she continues to have difficulty with balance and completing this task, moderate assist.   Time 6   Period Months   Status On-going     OT LONG TERM GOAL #3   Title Patient will demonstrate the ability to perform vacuuming of one room with modified independence using light weight vacuum.   Baseline still requires max assist   Time  6   Period Months   Status On-going     OT LONG TERM GOAL #5   Title Patient will demonstrate donning and doffing her bra with modified independence.   Baseline max assist   Time 12   Period Weeks   Status Achieved     OT LONG TERM GOAL #6   Title Will now be able to go through a door that swings out using motorized wheel chair.   Baseline difficulty depending on type of door and set up of the area, at times requires max assistance depending on the weight of the door.   Time 6   Period Months   Status On-going     OT LONG TERM GOAL  #9   Baseline Patient will be able to braid her own hair before college   Time 6   Period Months   Status On-going     OT LONG TERM GOAL  #10   TITLE Wll be able to wash her own hair before starting college   Baseline  mom still has to help with more than 20% of task now down from 50%.     Time 6   Period Months   Status On-going     OT LONG TERM GOAL  #11   TITLE Will be able to squeeze tooth paste out of the tube when it is less than half full.   Baseline still requires min assist if tube is less than 1/2 full.   Time 6   Period Months   Status Partially Met     OT LONG TERM GOAL  #12   TITLE Patient will demonstrate managing wheelchair rain poncho in inclement weather with modified independence.   Baseline crutch tips are difficult for anyone to take them off, goal deferred.   Time 6   Period Months   Status On-going     OT LONG TERM GOAL  #13   TITLE Patient will be able to complete 2 1/2 minutes straight with left shoulder flexion simulating driving with a stearing wheel ball to be able to drive to college.    Baseline can do 30 second of shoulder flexion with 2 1/2 before fatigue and can only drive 15 min before right shoulder fatigues.   Time 6   Period Months   Status On-going     OT LONG TERM GOAL  #14   TITLE Will be able to open taped boxes (for moving to college) using a key or knife.   Baseline ongoing techniques explored with adaptive devices to assist.   Time 6   Period Months   Status On-going     OT LONG TERM GOAL  #15   TITLE Patient will be able to obtain food tray in the cafeteria from a wheelchair level and take to the table without spilling with modified independence.    Baseline max assist   Time 6   Period Months   Status Partially Met               Plan - 06/21/16 1458    Clinical Impression Statement Pt.'s back pain is improving. Pt. was able to participate in reaching tasks without weight or resistance. Pt. continues to work on improving grip strength, pinch strength, and coordination skills to be able to improve functional UE and hand use during ADL and IADL tasks. Pt. was provided with black elastic shoelaces.   Rehab Potential Good   OT Frequency 2x  / week   OT  Treatment/Interventions Self-care/ADL training;Therapeutic exercise;Therapeutic exercises;Therapeutic activities   Consulted and Agree with Plan of Care Patient;Family member/caregiver      Patient will benefit from skilled therapeutic intervention in order to improve the following deficits and impairments:  Impaired UE functional use, Difficulty walking, Decreased strength, Decreased coordination, Decreased endurance, Decreased balance  Visit Diagnosis: Muscle weakness (generalized)  Other lack of coordination    Problem List There are no active problems to display for this patient.  Harrel Carina, MS, OTR/L  Harrel Carina 06/21/2016, 4:09 PM  [place] MAIN [facility] SERVICES [address] [place], [place], [address] Phone: [phone]   Fax:  [phone]  Name: Michelle Randall MRN: [id number] Date of Birth: 02/22/1996

## 2016-06-23 ENCOUNTER — Ambulatory Visit: Payer: Medicaid Other | Admitting: Occupational Therapy

## 2016-06-23 ENCOUNTER — Ambulatory Visit: Payer: Medicaid Other | Admitting: Physical Therapy

## 2016-06-23 ENCOUNTER — Encounter: Payer: Self-pay | Admitting: Occupational Therapy

## 2016-06-23 ENCOUNTER — Encounter: Payer: Self-pay | Admitting: Physical Therapy

## 2016-06-23 DIAGNOSIS — M6281 Muscle weakness (generalized): Secondary | ICD-10-CM | POA: Diagnosis not present

## 2016-06-23 DIAGNOSIS — R278 Other lack of coordination: Secondary | ICD-10-CM

## 2016-06-23 DIAGNOSIS — R262 Difficulty in walking, not elsewhere classified: Secondary | ICD-10-CM

## 2016-06-23 NOTE — Therapy (Signed)
[place] MAIN [facility] SERVICES [address] [place], [place], [address] Phone: [phone]   Fax:  [phone]  Occupational Therapy Treatment  Patient Details  Name: Michelle Randall MRN: [id number] Date of Birth: 1996-10-18 No Data Recorded  Encounter Date: 06/23/2016      OT End of Session - 06/23/16 1714    Visit Number 37   Number of Visits 28   Date for OT Re-Evaluation 07/09/16   Authorization Type medicaid visit 89 of 25   OT Start Time 1533   OT Stop Time 1611   OT Time Calculation (min) 38 min   Activity Tolerance Patient tolerated treatment well   Behavior During Therapy [facility] for tasks assessed/performed      History reviewed. No pertinent past medical history.  History reviewed. No pertinent surgical history.  There were no vitals filed for this visit.      Subjective Assessment - 06/23/16 1536    Subjective  Pt. reports having having no pain in back today. Reports its much better now.   Patient is accompained by: Family member   Patient Stated Goals wants to be independent with all tasks so she will be able to go away to college.   Currently in Pain? No/denies   Pain Score 0-No pain      OT TREATMENT    Neuro muscular re-education:  Pt. Worked on grasping 1" resistive cubes to focus resistive grasping with thumb opposition to the tip of the 2nd through 5th. Pt. Worked on placing cubes, and isolating 2nd through 5th digits when placing them at a vertical angle.  Therapeutic Exercise:  2# dumbbell ex. for elbow flexion and extension,  forearm supination/pronation, wrist flexion/extension, and radial deviation. Pt. requires rest breaks and verbal cues for proper technique.Pt. performed gross gripping with grip strengthener. Pt. worked on sustaining grip while grasping pegs and reaching at various heights. Gripper was placed in the 3rd resistive slot with the white resistive spring. Pt. Worked on pinch strengthening in  the left hand for lateral, and 3pt. pinch using yellow, red, green, and blue resistive clips. Pt. worked on placing the clips at various vertical and horizontal angles. Tactile and verbal cues were required for eliciting the desired movement. Pt. Worked on the digiflex 1.5 alternating, and simultaneous digit movement on the right hand.                           OT Education - 06/23/16 1614    Education provided Yes   Person(s) Educated Patient   Methods Explanation;Demonstration   Comprehension Verbalized understanding;Returned demonstration             OT Long Term Goals - 06/14/16 1603      OT LONG TERM GOAL #1   Title Patient will demonstrate methods of picking up her bookbag and placing it onto the back of her chair with modified independence to transport items to class.   Baseline requires assistance from others to complete now but will need to be independent when at college in the fall .   Time 6   Period Months   Status Partially Met     OT LONG TERM GOAL #2   Title Patient will be able to make her bed with modified independence.   Baseline she continues to have difficulty with balance and completing this task, moderate assist.   Time 6   Period Months   Status On-going  OT LONG TERM GOAL #3   Title Patient will demonstrate the ability to perform vacuuming of one room with modified independence using light weight vacuum.   Baseline still requires max assist   Time 6   Period Months   Status On-going     OT LONG TERM GOAL #5   Title Patient will demonstrate donning and doffing her bra with modified independence.   Baseline max assist   Time 12   Period Weeks   Status Achieved     OT LONG TERM GOAL #6   Title Will now be able to go through a door that swings out using motorized wheel chair.   Baseline difficulty depending on type of door and set up of the area, at times requires max assistance depending on the weight of the door.   Time 6    Period Months   Status On-going     OT LONG TERM GOAL  #9   Baseline Patient will be able to braid her own hair before college   Time 6   Period Months   Status On-going     OT LONG TERM GOAL  #10   TITLE Wll be able to wash her own hair before starting college   Baseline mom still has to help with more than 20% of task now down from 50%.     Time 6   Period Months   Status On-going     OT LONG TERM GOAL  #11   TITLE Will be able to squeeze tooth paste out of the tube when it is less than half full.   Baseline still requires min assist if tube is less than 1/2 full.   Time 6   Period Months   Status Partially Met     OT LONG TERM GOAL  #12   TITLE Patient will demonstrate managing wheelchair rain poncho in inclement weather with modified independence.   Baseline crutch tips are difficult for anyone to take them off, goal deferred.   Time 6   Period Months   Status On-going     OT LONG TERM GOAL  #13   TITLE Patient will be able to complete 2 1/2 minutes straight with left shoulder flexion simulating driving with a stearing wheel ball to be able to drive to college.    Baseline can do 30 second of shoulder flexion with 2 1/2 before fatigue and can only drive 15 min before right shoulder fatigues.   Time 6   Period Months   Status On-going     OT LONG TERM GOAL  #14   TITLE Will be able to open taped boxes (for moving to college) using a key or knife.   Baseline ongoing techniques explored with adaptive devices to assist.   Time 6   Period Months   Status On-going     OT LONG TERM GOAL  #15   TITLE Patient will be able to obtain food tray in the cafeteria from a wheelchair level and take to the table without spilling with modified independence.    Baseline max assist   Time 6   Period Months   Status Partially Met               Plan - 06/23/16 1715    Clinical Impression Statement Pt. upper back pain is resolving per pt. Pt. continues to work on improving  distal strengthening, and [facility] skills to be able to [place], and manage items for ADL, and IADL  tasks efficiently as pt. is preparing  for her freshman year of college. in a few weeks.   Rehab Potential Good   OT Frequency 2x / week   OT Duration 12 weeks   Family Member Consulted father      Patient will benefit from skilled therapeutic intervention in order to improve the following deficits and impairments:  Impaired UE functional use, Difficulty walking, Decreased strength, Decreased coordination, Decreased endurance, Decreased balance  Visit Diagnosis: Muscle weakness (generalized)  Other lack of coordination    Problem List There are no active problems to display for this patient.   Harrel Carina, MS, OTR/L 06/23/2016, 5:21 PM  [place] MAIN [facility] SERVICES [address] [place], [place], [address] Phone: [phone]   Fax:  [phone]  Name: Michelle Randall MRN: [id number] Date of Birth: 04-22-1996

## 2016-06-23 NOTE — Patient Instructions (Addendum)
OT TREATMENT    Neuro muscular re-education:  Pt. Worked on grasping 1" resistive cubes to focus resistive grasping with thumb opposition to the tip of the 2nd through 5th. Pt. Worked on placing cubes, and isolating 2nd through 5th digits when placing them at a vertical angle.  Therapeutic Exercise:  2# dumbbell ex. for elbow flexion and extension,  forearm supination/pronation, wrist flexion/extension, and radial deviation. Pt. requires rest breaks and verbal cues for proper technique.Pt. performed gross gripping with grip strengthener. Pt. worked on sustaining grip while grasping pegs and reaching at various heights. Gripper was placed in the 3rd resistive slot with the white resistive spring. Pt. Worked on pinch strengthening in the left hand for lateral, and 3pt. pinch using yellow, red, green, and blue resistive clips. Pt. worked on placing the clips at various vertical and horizontal angles. Tactile and verbal cues were required for eliciting the desired movement. Pt. Worked on the digiflex 1.5 alternating, and simultaneous digit movement on the right hand.

## 2016-06-23 NOTE — Therapy (Signed)
[place] [facility]IN [facility] [address] [place], [place], [address] Phone: [phone]   Fax:  [phone]  Physical Therapy Treatment  Patient Details  Name: Julaine HuaMisbah N Talley MRN: [id number] Date of Birth: [birth year] No Data Recorded  Encounter Date: 06/23/2016      PT End of Session - 06/23/16 1706    Visit Number 48   Number of Visits 44   Date for PT Re-Evaluation 05/19/16   Authorization Type medicaid   PT Start Time 1615   PT Stop Time 1700   PT Time Calculation (min) 45 min   Equipment Utilized During Treatment Gait belt   Activity Tolerance Patient tolerated treatment well   Behavior During Therapy [facility] for tasks assessed/performed      History reviewed. No pertinent past medical history.  History reviewed. No pertinent surgical history.  There were no vitals filed for this visit.      Subjective Assessment - 06/23/16 1705    Subjective Pt is doing well. She is excited about starting college.   Patient is accompained by: Family member   Limitations Walking   Patient Stated Goals Patient wants to improve her core strength.    Currently in Pain? No/denies   Pain Score 0-No pain   Pain Onset In the past 7 days      TM walking with elevation 2 and 1.0 miles/ hour Side stepping in parallel bars with YTB x 10  Step ups to 6 inch stool x 20 x 2 Eccentric step downs from 4 inch stool x 10 x 2 Min cueing needed to appropriately perform strengtening tasks with leg, and head position..  Patient responds well to verbal and tactile cues to correct form and technique.  CGA to SBA for safety with activities.  Uses to increase intensity and amplitude of movements throughout session                           PT Education - 06/23/16 1706    Education provided Yes   Education Details standing up straight with ambulation   Person(s) Educated Patient   Methods Explanation   Comprehension Verbalized  understanding             PT Long Term Goals - 05/26/16 1308      PT LONG TERM GOAL #1   Title Patient will improve Dynamic Gait Index (DGI) score to > 19/24 for low falls risk regarding dynamic walking tasks    Time 12   Period Weeks   Status On-going     PT LONG TERM GOAL #2   Title Patient will increase Berg Balance score by > 6 points to demonstrate decreased fall risk during functional activities    Time 12   Status On-going     PT LONG TERM GOAL #3   Title Patient will be able to transfer in and out of a large car with Patient will be able to transfer in and out of a high seated car with CGA    Time 12   Period Weeks   Status On-going     PT LONG TERM GOAL #4   Title Patient will complete a TUG test in < 12 seconds for independent mobility and decreased fall risk    Time 12   Period Weeks   Status On-going     PT LONG TERM GOAL #6   Title Patient will improve gait speed to > 1.2 m/s  with least restrictive assistive device to return to normal walking speed    Time 12   Period Weeks   Status On-going     PT LONG TERM GOAL #7   Title Patient (< 82 years old) will complete five times sit to stand test in < 10 seconds indicating an increased LE strength and improved balance    Baseline Completed 05/07/2015 (9.85 seconds)      PT LONG TERM GOAL #8   Title Patient will be able to ambulate on inclines and grass independenlty with LRAD   Period Weeks   Status New     PT LONG TERM GOAL  #9   TITLE Patient will be abe to transfer from low chair or stool wihtout UE support independently   Period Weeks   Status On-going     PT LONG TERM GOAL  #10   TITLE Patient will be able to transfer from the floor to standing independently with use of LRAD   Time 12   Period Weeks   Status On-going               Plan - 06/23/16 1707    Clinical Impression Statement Patient continues to have difficulty with ascending and descending steps and uses a railing. She is able  to perform LE exercises without pain and with CGA.    Rehab Potential Good   Clinical Impairments Affecting Rehab Potential weakness and decreased standing balance   PT Frequency 1x / week   PT Duration 12 weeks   PT Treatment/Interventions Therapeutic exercise;Therapeutic activities;Gait training;Balance training;Stair training;DME Instruction;Neuromuscular re-education;Patient/family education   PT Next Visit Plan progress LE, core strengthening and balance   PT Home Exercise Plan Continued from previous sessions.    Consulted and Agree with Plan of Care Patient      Patient will benefit from skilled therapeutic intervention in order to improve the following deficits and impairments:  Abnormal gait, Decreased balance, Decreased endurance, Difficulty walking, Decreased strength  Visit Diagnosis: Muscle weakness (generalized)  Other lack of coordination  Difficulty in walking, not elsewhere classified     Problem List There are no active problems to display for this patient. [name], PT, DPT  [place], Barkley Bruns S 06/23/2016, 5:09 PM  [place] [facility] MAIN [facility] SERVICES [address] [place], [place], [address] Phone: [phone]   Fax:  [phone]  Name: REMIE MATHISON MRN: [id number] Date of Birth: April 29, 1996

## 2016-06-28 ENCOUNTER — Ambulatory Visit: Payer: Medicaid Other | Admitting: Occupational Therapy

## 2016-06-28 DIAGNOSIS — M6281 Muscle weakness (generalized): Secondary | ICD-10-CM | POA: Diagnosis not present

## 2016-06-28 DIAGNOSIS — R278 Other lack of coordination: Secondary | ICD-10-CM

## 2016-06-28 NOTE — Therapy (Signed)
[place] MAIN [facility] SERVICES [address] [place], [place], [address] Phone: [phone]   Fax:  [phone]  Occupational Therapy Treatment  Patient Details  Name: Michelle Randall MRN: [id number] Date of Birth: January 06, 1996 No Data Recorded  Encounter Date: 06/28/2016      OT End of Session - 06/28/16 1608    Visit Number 38   Number of Visits 18   Date for OT Re-Evaluation 07/09/16   Authorization Type medicaid visit 30 of 28   OT Start Time 1350   OT Stop Time 1430   OT Time Calculation (min) 40 min   Activity Tolerance Patient tolerated treatment well   Behavior During Therapy WFL for tasks assessed/performed        OT TREATMENT    Therapeutic Exercise:  BUE strengthening with 2# dumbbell ex. for elbow flexion and extension,  forearm supination/pronation, wrist flexion/extension, and radial deviation. Pt. requires rest breaks and verbal cues for proper technique. Pt. performed gross gripping with grip strengthener. Pt. worked on sustaining grip while grasping pegs and reaching at various heights. Gripper was placed in the 2nd resistive slot with the white resistive spring. Pt. Worked on pinch strengthening in the left hand for lateral, and 3pt. pinch using yellow, red, green, and blue resistive clips. Pt. worked on placing the clips at various vertical and horizontal angles. Tactile and verbal cues were required for eliciting the desired movement.  Neuromuscular re-ed:  Pt. Worked on grasping 1" resistive cubes positioned at a vertical angle. Pt. Worked on alternating thumb opposition to the tip of the 2nd through 5th digits.                              OT Education - 06/28/16 1615    Education provided Yes   Methods Explanation   Comprehension Verbalized understanding             OT Long Term Goals - 06/14/16 1603      OT LONG TERM GOAL #1   Title Patient will demonstrate methods of picking  up her bookbag and placing it onto the back of her chair with modified independence to transport items to class.   Baseline requires assistance from others to complete now but will need to be independent when at college in the fall .   Time 6   Period Months   Status Partially Met     OT LONG TERM GOAL #2   Title Patient will be able to make her bed with modified independence.   Baseline she continues to have difficulty with balance and completing this task, moderate assist.   Time 6   Period Months   Status On-going     OT LONG TERM GOAL #3   Title Patient will demonstrate the ability to perform vacuuming of one room with modified independence using light weight vacuum.   Baseline still requires max assist   Time 6   Period Months   Status On-going     OT LONG TERM GOAL #5   Title Patient will demonstrate donning and doffing her bra with modified independence.   Baseline max assist   Time 12   Period Weeks   Status Achieved     OT LONG TERM GOAL #6   Title Will now be able to go through a door that swings out using motorized wheel chair.   Baseline difficulty depending on type of door and  set up of the area, at times requires max assistance depending on the weight of the door.   Time 6   Period Months   Status On-going     OT LONG TERM GOAL  #9   Baseline Patient will be able to braid her own hair before college   Time 6   Period Months   Status On-going     OT LONG TERM GOAL  #10   TITLE Wll be able to wash her own hair before starting college   Baseline mom still has to help with more than 20% of task now down from 50%.     Time 6   Period Months   Status On-going     OT LONG TERM GOAL  #11   TITLE Will be able to squeeze tooth paste out of the tube when it is less than half full.   Baseline still requires min assist if tube is less than 1/2 full.   Time 6   Period Months   Status Partially Met     OT LONG TERM GOAL  #12   TITLE Patient will demonstrate  managing wheelchair rain poncho in inclement weather with modified independence.   Baseline crutch tips are difficult for anyone to take them off, goal deferred.   Time 6   Period Months   Status On-going     OT LONG TERM GOAL  #13   TITLE Patient will be able to complete 2 1/2 minutes straight with left shoulder flexion simulating driving with a stearing wheel ball to be able to drive to college.    Baseline can do 30 second of shoulder flexion with 2 1/2 before fatigue and can only drive 15 min before right shoulder fatigues.   Time 6   Period Months   Status On-going     OT LONG TERM GOAL  #14   TITLE Will be able to open taped boxes (for moving to college) using a key or knife.   Baseline ongoing techniques explored with adaptive devices to assist.   Time 6   Period Months   Status On-going     OT LONG TERM GOAL  #15   TITLE Patient will be able to obtain food tray in the cafeteria from a wheelchair level and take to the table without spilling with modified independence.    Baseline max assist   Time 6   Period Months   Status Partially Met               Plan - 06/28/16 1608    Clinical Impression Statement Pt. reports the pain in her back has resolved. Pt. continues to work on improving UE strenth, grip strength, pinch strength, and coordination skills to be able to perform school, and college related tasks.   Rehab Potential Good   OT Frequency 2x / week   OT Duration 12 weeks   OT Treatment/Interventions Self-care/ADL training;Therapeutic exercise;Therapeutic exercises;Therapeutic activities   Consulted and Agree with Plan of Care Patient;Family member/caregiver   Family Member Consulted father      Patient will benefit from skilled therapeutic intervention in order to improve the following deficits and impairments:  Impaired UE functional use, Difficulty walking, Decreased strength, Decreased coordination, Decreased endurance, Decreased balance  Visit  Diagnosis: No diagnosis found.    Problem List There are no active problems to display for this patient.   Harrel Carina, MS, OTR/L 06/28/2016, 4:17 PM  [place] MAIN [facility] SERVICES  [place], [place], [address] Phone: [phone]   Fax:  [phone]  Name: Michelle Randall MRN: [id number] Date of Birth: 01/23/1996

## 2016-06-28 NOTE — Patient Instructions (Addendum)
OT TREATMENT    Therapeutic Exercise:  BUE strengthening with 2# dumbbell ex. for elbow flexion and extension,  forearm supination/pronation, wrist flexion/extension, and radial deviation. Pt. requires rest breaks and verbal cues for proper technique. Pt. performed gross gripping with grip strengthener. Pt. worked on sustaining grip while grasping pegs and reaching at various heights. Gripper was placed in the 2nd resistive slot with the white resistive spring. Pt. Worked on pinch strengthening in the left hand for lateral, and 3pt. pinch using yellow, red, green, and blue resistive clips. Pt. worked on placing the clips at various vertical and horizontal angles. Tactile and verbal cues were required for eliciting the desired movement.  Neuromuscular re-ed:  Pt. Worked on grasping 1" resistive cubes positioned at a vertical angle. Pt. Worked on alternating thumb opposition to the tip of the 2nd through 5th digits.

## 2016-06-30 ENCOUNTER — Ambulatory Visit: Payer: Medicaid Other | Admitting: Physical Therapy

## 2016-06-30 ENCOUNTER — Ambulatory Visit: Payer: Medicaid Other

## 2016-06-30 ENCOUNTER — Ambulatory Visit: Payer: Medicaid Other | Admitting: Occupational Therapy

## 2016-06-30 DIAGNOSIS — M6281 Muscle weakness (generalized): Secondary | ICD-10-CM | POA: Diagnosis not present

## 2016-06-30 DIAGNOSIS — R262 Difficulty in walking, not elsewhere classified: Secondary | ICD-10-CM

## 2016-06-30 DIAGNOSIS — R278 Other lack of coordination: Secondary | ICD-10-CM

## 2016-06-30 NOTE — Therapy (Signed)
[facility] [address] [place], [place], [address] Phone: [phone]   Fax:  [phone]  Physical Therapy Treatment  Patient Details  Name: Michelle Randall MRN: [id number] Date of Birth: 04-15-1996 No Data Recorded  Encounter Date: 06/30/2016      PT End of Session - 07/02/16 1119    Visit Number 49   Number of Visits 44   Date for PT Re-Evaluation 08/09/16   Authorization Type medicaid   PT Start Time 1600   PT Stop Time 1645   PT Time Calculation (min) 45 min   Equipment Utilized During Treatment Gait belt   Activity Tolerance Patient tolerated treatment well   Behavior During Therapy [facility] for tasks assessed/performed      History reviewed. No pertinent past medical history.  History reviewed. No pertinent surgical history.  There were no vitals filed for this visit.      Subjective Assessment - 07/02/16 1118    Subjective Pt reports she is doing well currently. She is excited about starting at [employer] in a couple weeks. No specific questions or concerns currently.    Patient is accompained by: Family member   Limitations Walking   Patient Stated Goals Patient wants to improve her core strength.    Currently in Pain? No/denies       TREATMENT  THER-EX TM walking with elevation 2 and 1.0 miles/ hour x 7 minutes during history; Side stepping in parallel bars with YTB x 10  Step ups to 6 inch and 4 inch x 10; Sit to stand without UE support from progressively lower surfaces 2 x 5, x 10 from lowest surface; Hooklying bridges 2 x 10; Hooklying bridges with alteranating LE lift x 5 bilateral;  Sidelying hip abduction x 10 bilateral; Prone hip extension x 10 bilateral; Quadruped kick backs x 10 bilateral; Tall kneeling balance with external perturbations;  Min cueing needed to appropriately perform strengtening tasks with leg, and head position.Patient responds well to verbal and tactile cues  to correct form and technique. CGA to SBA for safety with activities.                          PT Education - 07/02/16 1118    Education provided Yes   Education Details HEP reinforced   Person(s) Educated Patient   Methods Explanation   Comprehension Verbalized understanding             PT Long Term Goals - 05/26/16 1308      PT LONG TERM GOAL #1   Title Patient will improve Dynamic Gait Index (DGI) score to > 19/24 for low falls risk regarding dynamic walking tasks    Time 12   Period Weeks   Status On-going     PT LONG TERM GOAL #2   Title Patient will increase Berg Balance score by > 6 points to demonstrate decreased fall risk during functional activities    Time 12   Status On-going     PT LONG TERM GOAL #3   Title Patient will be able to transfer in and out of a large car with Patient will be able to transfer in and out of a high seated car with CGA    Time 12   Period Weeks   Status On-going     PT LONG TERM GOAL #4   Title Patient will complete a TUG test in < 12 seconds for independent  mobility and decreased fall risk    Time 12   Period Weeks   Status On-going     PT LONG TERM GOAL #6   Title Patient will improve gait speed to > 1.2 m/s with least restrictive assistive device to return to normal walking speed    Time 12   Period Weeks   Status On-going     PT LONG TERM GOAL #7   Title Patient (< 19 years old) will complete five times sit to stand test in < 10 seconds indicating an increased LE strength and improved balance    Baseline Completed 05/07/2015 (9.85 seconds)      PT LONG TERM GOAL #8   Title Patient will be able to ambulate on inclines and grass independenlty with LRAD   Period Weeks   Status New     PT LONG TERM GOAL  #9   TITLE Patient will be abe to transfer from low chair or stool wihtout UE support independently   Period Weeks   Status On-going     PT LONG TERM GOAL  #10   TITLE Patient will be able to  transfer from the floor to standing independently with use of LRAD   Time 12   Period Weeks   Status On-going               Plan - 07/02/16 1123    Clinical Impression Statement Pt demonstrates considerable hip extension and abduction strength deficits with lumbar extension compensation for weakness. She denies pain during all exercises today as instructed but is challenged in high kneeling position. Pt encouraged to continue HEP and follow-up as scheduled.    Rehab Potential Good   Clinical Impairments Affecting Rehab Potential weakness and decreased standing balance   PT Frequency 1x / week   PT Duration 12 weeks   PT Treatment/Interventions Therapeutic exercise;Therapeutic activities;Gait training;Balance training;Stair training;DME Instruction;Neuromuscular re-education;Patient/family education   PT Next Visit Plan progress LE, core strengthening and balance   PT Home Exercise Plan Continued from previous sessions.    Consulted and Agree with Plan of Care Patient      Patient will benefit from skilled therapeutic intervention in order to improve the following deficits and impairments:  Abnormal gait, Decreased balance, Decreased endurance, Difficulty walking, Decreased strength  Visit Diagnosis: Muscle weakness (generalized)  Difficulty in walking, not elsewhere classified     Problem List There are no active problems to display for this patient.  Lynnea MaizesJason D Rielly Brunn PT, DPT   Uriah Trueba 07/02/2016, 11:24 AM  [facility] [address] [place], [place], [address] Phone: [phone]   Fax:  [phone]  Name: Michelle Randall MRN: [id number] Date of Birth: [birth year]

## 2016-07-04 ENCOUNTER — Encounter: Payer: Self-pay | Admitting: Occupational Therapy

## 2016-07-04 NOTE — Therapy (Signed)
[place] MAIN [facility] SERVICES [address] [place], [place], [address] Phone: [phone]   Fax:  [phone]  Occupational Therapy Treatment  Patient Details  Name: Michelle Randall MRN: [id number] Date of Birth: December 15, 1995 No Data Recorded  Encounter Date: 06/30/2016      OT End of Session - 07/04/16 1438    Visit Number 39   Number of Visits 57   Date for OT Re-Evaluation 07/09/16   Authorization Type medicaid visit 26 of 62   OT Start Time 1515   OT Stop Time 1600   OT Time Calculation (min) 45 min   Activity Tolerance Patient tolerated treatment well   Behavior During Therapy [facility] for tasks assessed/performed      History reviewed. No pertinent past medical history.  History reviewed. No pertinent surgical history.  There were no vitals filed for this visit.      Subjective Assessment - 07/04/16 1435    Subjective  Patient reports she will be going on vacation for a few days this weekend. She will be going to [employer] on August 25-27. Her first day of class is August 29. She reports they have found a place to store her power wheelchair when not in use. She has not yet been approved for a computer.    Patient is accompained by: Family member   Patient Stated Goals wants to be independent with all tasks so she will be able to go away to college.   Currently in Pain? No/denies   Pain Score 0-No pain   Multiple Pain Sites No                      OT Treatments/Exercises (OP) - 07/04/16 1436      ADLs   ADL Comments Patient was seen for typing task which included formulating her schedule for college and noting Times and locations for each class. She was able to map out her class schedule day by day using printed physical map of [place] with cues and occasional assistance.      Neurological Re-education Exercises   Other Exercises 1 Patient was seen this date for bilateral extremity strengthening with  red there a band for shoulder flexion, abduction, abduction, elbow flexion extension, and diagonal patterns, for 10 repetitions for two sets.                 OT Education - 07/04/16 1438    Education provided Yes   Education Details college tasks, schedule   Person(s) Educated Patient   Methods Explanation;Demonstration;Verbal cues   Comprehension Verbal cues required;Returned demonstration;Verbalized understanding             OT Long Term Goals - 06/14/16 1603      OT LONG TERM GOAL #1   Title Patient will demonstrate methods of picking up her bookbag and placing it onto the back of her chair with modified independence to transport items to class.   Baseline requires assistance from others to complete now but will need to be independent when at college in the fall .   Time 6   Period Months   Status Partially Met     OT LONG TERM GOAL #2   Title Patient will be able to make her bed with modified independence.   Baseline she continues to have difficulty with balance and completing this task, moderate assist.   Time 6   Period Months   Status On-going  OT LONG TERM GOAL #3   Title Patient will demonstrate the ability to perform vacuuming of one room with modified independence using light weight vacuum.   Baseline still requires max assist   Time 6   Period Months   Status On-going     OT LONG TERM GOAL #5   Title Patient will demonstrate donning and doffing her bra with modified independence.   Baseline max assist   Time 12   Period Weeks   Status Achieved     OT LONG TERM GOAL #6   Title Will now be able to go through a door that swings out using motorized wheel chair.   Baseline difficulty depending on type of door and set up of the area, at times requires max assistance depending on the weight of the door.   Time 6   Period Months   Status On-going     OT LONG TERM GOAL  #9   Baseline Patient will be able to braid her own hair before college    Time 6   Period Months   Status On-going     OT LONG TERM GOAL  #10   TITLE Wll be able to wash her own hair before starting college   Baseline mom still has to help with more than 20% of task now down from 50%.     Time 6   Period Months   Status On-going     OT LONG TERM GOAL  #11   TITLE Will be able to squeeze tooth paste out of the tube when it is less than half full.   Baseline still requires min assist if tube is less than 1/2 full.   Time 6   Period Months   Status Partially Met     OT LONG TERM GOAL  #12   TITLE Patient will demonstrate managing wheelchair rain poncho in inclement weather with modified independence.   Baseline crutch tips are difficult for anyone to take them off, goal deferred.   Time 6   Period Months   Status On-going     OT LONG TERM GOAL  #13   TITLE Patient will be able to complete 2 1/2 minutes straight with left shoulder flexion simulating driving with a stearing wheel ball to be able to drive to college.    Baseline can do 30 second of shoulder flexion with 2 1/2 before fatigue and can only drive 15 min before right shoulder fatigues.   Time 6   Period Months   Status On-going     OT LONG TERM GOAL  #14   TITLE Will be able to open taped boxes (for moving to college) using a key or knife.   Baseline ongoing techniques explored with adaptive devices to assist.   Time 6   Period Months   Status On-going     OT LONG TERM GOAL  #15   TITLE Patient will be able to obtain food tray in the cafeteria from a wheelchair level and take to the table without spilling with modified independence.    Baseline max assist   Time 6   Period Months   Status Partially Met               Plan - 07/04/16 1439    Clinical Impression Statement Patient will be attending orientation for college next week and will likely benefit from a site visit to the college to determine access to each classroom, bathrooms, access to the cafeteria and  Art therapist. We will  plan to work with patient and implement schedule and follow class times and locations with use of her power chair and loft strand crutches. Will make additional recommendations based on any issues encountered during a visit. She will be working with [employer] for access to e-books. She is still awaiting approval for a computer for college and would benefit from a lightweight laptop for notetaking and classroom assignments. Patient's strength has diminished slightly over the last few weeks due to recent fall and not being able to work on lifting objects.    Rehab Potential Good   OT Frequency 2x / week   OT Duration 12 weeks   OT Treatment/Interventions Self-care/ADL training;Therapeutic exercise;Therapeutic exercises;Therapeutic activities   Consulted and Agree with Plan of Care Patient      Patient will benefit from skilled therapeutic intervention in order to improve the following deficits and impairments:  Impaired UE functional use, Difficulty walking, Decreased strength, Decreased coordination, Decreased endurance, Decreased balance  Visit Diagnosis: Muscle weakness (generalized)  Other lack of coordination    Problem List There are no active problems to display for this patient.  Achilles Dunk, OTR/L, CLT  Lovett,Amy 07/04/2016, 2:41 PM  [place] MAIN [facility] SERVICES [address] [place], [place], [address] Phone: [phone]   Fax:  [phone]  Name: Michelle Randall MRN: [id number] Date of Birth: 01-23-1996

## 2016-07-05 ENCOUNTER — Ambulatory Visit: Payer: Medicaid Other | Admitting: Physical Therapy

## 2016-07-05 ENCOUNTER — Encounter: Payer: Medicaid Other | Admitting: Occupational Therapy

## 2016-07-07 ENCOUNTER — Ambulatory Visit: Payer: Medicaid Other | Admitting: Physical Therapy

## 2016-07-07 ENCOUNTER — Encounter: Payer: Medicaid Other | Admitting: Occupational Therapy

## 2016-07-08 ENCOUNTER — Ambulatory Visit: Payer: Medicaid Other | Admitting: Physical Therapy

## 2016-07-08 ENCOUNTER — Ambulatory Visit: Payer: Medicaid Other | Admitting: Occupational Therapy

## 2016-07-08 ENCOUNTER — Encounter: Payer: Self-pay | Admitting: Physical Therapy

## 2016-07-08 DIAGNOSIS — M6281 Muscle weakness (generalized): Secondary | ICD-10-CM

## 2016-07-08 DIAGNOSIS — R262 Difficulty in walking, not elsewhere classified: Secondary | ICD-10-CM

## 2016-07-08 DIAGNOSIS — R278 Other lack of coordination: Secondary | ICD-10-CM

## 2016-07-08 NOTE — Therapy (Signed)
[place] [facility] MAIN [facility] SERVICES [address] [place], [place], [address] Phone: [phone]   Fax:  [phone]  Physical Therapy Treatment  Patient Details  Name: Michelle Randall MRN: [id number] Date of Birth: Feb 25, 1996 No Data Recorded  Encounter Date: 07/08/2016      PT End of Session - 07/08/16 1722    Visit Number 50   Number of Visits 44   Date for PT Re-Evaluation 08/09/16   Authorization Type medicaid   PT Start Time 1600   PT Stop Time 1645   PT Time Calculation (min) 45 min   Equipment Utilized During Treatment Gait belt   Activity Tolerance Patient tolerated treatment well   Behavior During Therapy [facility] for tasks assessed/performed      History reviewed. No pertinent past medical history.  History reviewed. No pertinent surgical history.  There were no vitals filed for this visit.      Subjective Assessment - 07/08/16 1721    Subjective Pt reports she is doing well currently. She is excited about starting at [employer] in a couple weeks. No specific questions or concerns currently.    Patient is accompained by: Family member   Limitations Walking   Patient Stated Goals Patient wants to improve her core strength.    Currently in Pain? No/denies   Pain Score 0-No pain   Pain Onset In the past 7 days   Multiple Pain Sites No      Therapeutic exercise  standing hip abd with YTB x 20  side stepping left and right in parallel bars 10 feet x 3 standing on blue foam with cone reaching x 20 across midline step ups from floor to 6 inch stool x 20 bilateral sit to stand x 10 marching in parallel bars x 20 Patient needs occasional verbal cueing to improve posture and cueing to correctly perform exercises slowly, holding at end of range to increase motor firing of desired muscle to encourage fatigue.                           PT Education - 07/08/16 1722    Education provided Yes   Education  Details safety with steps   Person(s) Educated Patient   Methods Explanation   Comprehension Verbalized understanding             PT Long Term Goals - 05/26/16 1308      PT LONG TERM GOAL #1   Title Patient will improve Dynamic Gait Index (DGI) score to > 19/24 for low falls risk regarding dynamic walking tasks    Time 12   Period Weeks   Status On-going     PT LONG TERM GOAL #2   Title Patient will increase Berg Balance score by > 6 points to demonstrate decreased fall risk during functional activities    Time 12   Status On-going     PT LONG TERM GOAL #3   Title Patient will be able to transfer in and out of a large car with Patient will be able to transfer in and out of a high seated car with CGA    Time 12   Period Weeks   Status On-going     PT LONG TERM GOAL #4   Title Patient will complete a TUG test in < 12 seconds for independent mobility and decreased fall risk    Time 12   Period Weeks   Status On-going  PT LONG TERM GOAL #6   Title Patient will improve gait speed to > 1.2 m/s with least restrictive assistive device to return to normal walking speed    Time 12   Period Weeks   Status On-going     PT LONG TERM GOAL #7   Title Patient (< 19 years old) will complete five times sit to stand test in < 10 seconds indicating an increased LE strength and improved balance    Baseline Completed 05/07/2015 (9.85 seconds)      PT LONG TERM GOAL #8   Title Patient will be able to ambulate on inclines and grass independenlty with LRAD   Period Weeks   Status New     PT LONG TERM GOAL  #9   TITLE Patient will be abe to transfer from low chair or stool wihtout UE support independently   Period Weeks   Status On-going     PT LONG TERM GOAL  #10   TITLE Patient will be able to transfer from the floor to standing independently with use of LRAD   Time 12   Period Weeks   Status On-going               Plan - 07/08/16 1723    Clinical Impression  Statement  Muscle fatigue but no major pain complaints. Patient advancing to red theraband for exercises listed above   Rehab Potential Good   Clinical Impairments Affecting Rehab Potential weakness and decreased standing balance   PT Frequency 1x / week   PT Duration 12 weeks   PT Treatment/Interventions Therapeutic exercise;Therapeutic activities;Gait training;Balance training;Stair training;DME Instruction;Neuromuscular re-education;Patient/family education   PT Next Visit Plan progress LE, core strengthening and balance   PT Home Exercise Plan Continued from previous sessions.    Consulted and Agree with Plan of Care Patient      Patient will benefit from skilled therapeutic intervention in order to improve the following deficits and impairments:  Abnormal gait, Decreased balance, Decreased endurance, Difficulty walking, Decreased strength  Visit Diagnosis: Muscle weakness (generalized)  Other lack of coordination  Difficulty in walking, not elsewhere classified     Problem List There are no active problems to display for this patient.  Ezekiel InaKristine S [name], PT, DPT [place], Barkley BrunsKristine S 07/08/2016, 5:28 PM  [place] [facility] [address] [place], [place], [address] Phone: [phone]   Fax:  [phone]  Name: Michelle Randall MRN: [id number] Date of Birth: 10/20/1996

## 2016-07-13 ENCOUNTER — Ambulatory Visit: Payer: Medicaid Other | Admitting: Physical Therapy

## 2016-07-13 ENCOUNTER — Ambulatory Visit: Payer: Medicaid Other | Admitting: Occupational Therapy

## 2016-07-13 DIAGNOSIS — M6281 Muscle weakness (generalized): Secondary | ICD-10-CM

## 2016-07-13 DIAGNOSIS — R262 Difficulty in walking, not elsewhere classified: Secondary | ICD-10-CM

## 2016-07-13 DIAGNOSIS — R278 Other lack of coordination: Secondary | ICD-10-CM

## 2016-07-13 NOTE — Therapy (Signed)
[place] [facility] [address] [place], [place], [address] Phone: [phone]   Fax:  [phone]  Physical Therapy Treatment  Patient Details  Name: Michelle HuaMisbah N Randall MRN: [id number] Date of Birth: 06/02/1996 No Data Recorded  Encounter Date: 07/13/2016      PT End of Session - 07/13/16 1554    Visit Number 51   Number of Visits 44   Date for PT Re-Evaluation 08/09/16   Authorization Type medicaid   PT Start Time 1517   PT Stop Time 1602   PT Time Calculation (min) 45 min   Equipment Utilized During Treatment Gait belt   Activity Tolerance Patient tolerated treatment well   Behavior During Therapy [facility] for tasks assessed/performed      History reviewed. No pertinent past medical history.  History reviewed. No pertinent surgical history.  There were no vitals filed for this visit.      Subjective Assessment - 07/13/16 1532    Subjective Pt had her [facility] [employer] [facility] and is not having any issues getting around campus (using power WC on campus and loftstrand crutches in classroom).  Does not have any questions or complaints.  Has been struggling to do her HEP every day with orientation and school starting.   Limitations Walking   Patient Stated Goals Patient wants to improve her core strength.    Currently in Pain? No/denies   Pain Onset In the past 7 days        TREATMENT:   Therapeutic exercise:  Standing hip abd without theraband 1x10 each side, with YTB 1x10 each side and Bil UE support. Cues to avoid compensatory lateral flexion with improvement.  Side stepping left and right in parallel bars 10 feet x 3 with cues for slight Bil knee bend  Standing on blue foam with cone reaching x 20 across midline, pt occassionally reaching out for //bar for support  Step ups from floor to 6 inch stool x 20 bilateral  Hip hikes onto 8" step 2x10 Bil with Bil UE support on // bar  Marching in parallel bars x3 laps  Sit  to stand 2x10        PT Education - 07/13/16 1534    Education provided Yes   Education Details Exercise technique, education on purpose of each exercise and muscles that are working   Starwood HotelsPerson(s) Educated Patient   Methods Explanation;Demonstration;Verbal cues   Comprehension Verbalized understanding;Returned demonstration             PT Long Term Goals - 05/26/16 1308      PT LONG TERM GOAL #1   Title Patient will improve Dynamic Gait Index (DGI) score to > 19/24 for low falls risk regarding dynamic walking tasks    Time 12   Period Weeks   Status On-going     PT LONG TERM GOAL #2   Title Patient will increase Berg Balance score by > 6 points to demonstrate decreased fall risk during functional activities    Time 12   Status On-going     PT LONG TERM GOAL #3   Title Patient will be able to transfer in and out of a large car with Patient will be able to transfer in and out of a high seated car with CGA    Time 12   Period Weeks   Status On-going     PT LONG TERM GOAL #4   Title Patient will complete a TUG test in <  12 seconds for independent mobility and decreased fall risk    Time 12   Period Weeks   Status On-going     PT LONG TERM GOAL #6   Title Patient will improve gait speed to > 1.2 m/s with least restrictive assistive device to return to normal walking speed    Time 12   Period Weeks   Status On-going     PT LONG TERM GOAL #7   Title Patient (< 87 years old) will complete five times sit to stand test in < 10 seconds indicating an increased LE strength and improved balance    Baseline Completed 05/07/2015 (9.85 seconds)      PT LONG TERM GOAL #8   Title Patient will be able to ambulate on inclines and grass independenlty with LRAD   Period Weeks   Status New     PT LONG TERM GOAL  #9   TITLE Patient will be abe to transfer from low chair or stool wihtout UE support independently   Period Weeks   Status On-going     PT LONG TERM GOAL  #10   TITLE  Patient will be able to transfer from the floor to standing independently with use of LRAD   Time 12   Period Weeks   Status On-going               Plan - 07/13/16 1554    Clinical Impression Statement Pt in good spirits today and motivated to participate.  No complaints during session and denies pain.  Is progressing well and will continue with strengthening and neuromuscular rehabilitation with focus on self awareness and technique.   Rehab Potential Good   Clinical Impairments Affecting Rehab Potential weakness and decreased standing balance   PT Frequency 1x / week   PT Duration 12 weeks   PT Treatment/Interventions Therapeutic exercise;Therapeutic activities;Gait training;Balance training;Stair training;DME Instruction;Neuromuscular re-education;Patient/family education   PT Next Visit Plan progress LE, core strengthening and balance   PT Home Exercise Plan Continued from previous sessions.    Consulted and Agree with Plan of Care Patient      Patient will benefit from skilled therapeutic intervention in order to improve the following deficits and impairments:  Abnormal gait, Decreased balance, Decreased endurance, Difficulty walking, Decreased strength  Visit Diagnosis: Muscle weakness (generalized)  Other lack of coordination  Difficulty in walking, not elsewhere classified     Problem List There are no active problems to display for this patient.   Encarnacion Chu PT, DPT 07/13/2016, 4:30 PM   [facility] MAIN [facility] SERVICES [address] [place], [place], [address] Phone: [phone]   Fax:  [phone]  Name: Michelle Randall MRN: [id number] Date of Birth: 12-27-1995

## 2016-07-14 ENCOUNTER — Encounter: Payer: Self-pay | Admitting: Occupational Therapy

## 2016-07-14 ENCOUNTER — Ambulatory Visit: Payer: Medicaid Other | Admitting: Physical Therapy

## 2016-07-15 NOTE — Therapy (Signed)
[facility] [facility] [address] [place], [place], [address] Phone: [phone]   Fax:  [phone]  Occupational Therapy Treatment/Recertification/Progress Note  Patient Details  Name: Michelle HuaMisbah N Goodenow MRN: [id number] Date of Birth: 04-27-1996 No Data Recorded  Encounter Date: 07/13/2016      OT End of Session - 07/15/16 2101    Visit Number 41   Number of Visits 70   Date for OT Re-Evaluation 10/05/16   Authorization Type medicaid visit 20 of 48   OT Start Time 1435   OT Stop Time 1515   OT Time Calculation (min) 40 min   Activity Tolerance Patient tolerated treatment well   Behavior During Therapy [facility] for tasks assessed/performed      No past medical history on file.  No past surgical history on file.  There were no vitals filed for this visit.      Subjective Assessment - 07/15/16 2044    Subjective  Patient reports her classes went well today on the first day of school. It was raining when she did she did not have her rain poncho yet therefore got wet. She also has not received approval for computer yet. 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 She reports she is glad therapist helped her to map out her schedule and go to each classroom in preparation for this week.    Patient Stated Goals wants to be independent with all tasks so she will be able to go away to college.   Currently in Pain? No/denies   Pain Score 0-No pain                      OT Treatments/Exercises (OP) - 07/15/16 2059      ADLs   ADL Comments Patient was seen for reassessment self-care tasks. She is able to demonstrate sad breaking out of here however continues to demonstrate difficulty with braiding down the back of her hair. She is now able to wash her hair thoroughly and get all shampoo rinsed independently. She was able to miniature about this. She has been fitted for  elastic shoelaces to increase independence in donning and doffing shoes. She is on able to open and close the class of her necklace currently and has difficulty taking the backs off of her earrings. If she drops the back of the evening on the floor she is unable to pick it up. Patient is able to demonstrate note writing of two 5 sentence paragraphs prior to hand fatigue. Patient seen for strengthening task of lifting up to 8 pounds equivalent to her book bag with laptop inside. Trials of moving backpack from the back of wheelchair to floor and from the floor to her lap.                 OT Education - 07/15/16 2100    Education provided Yes   Education Details managing heavier bookbag, goals, updated progress   Person(s) Educated Patient   Methods Explanation;Demonstration;Verbal cues   Comprehension Verbal cues required;Returned demonstration;Verbalized understanding             OT Long Term Goals - 07/15/16 2103      OT LONG TERM GOAL #1   Title Patient will demonstrate methods of picking up her bookbag and placing it onto the back of her chair with modified independence to transport items to class.  Baseline requires assistance from others to complete now but will need to be independent when at college in the fall .   Time 6   Period Months   Status On-going     OT LONG TERM GOAL #2   Title Patient will be able to make her bed with modified independence.   Baseline she continues to have difficulty with balance and completing this task, moderate assist.   Time 6   Period Months     OT LONG TERM GOAL #3   Title Patient will demonstrate the ability to perform vacuuming of one room with modified independence using light weight vacuum.   Baseline still requires max assist   Time 6   Period Months   Status On-going     OT LONG TERM GOAL #5   Title Patient will demonstrate donning and doffing her bra with modified independence.   Baseline max assist   Time 12   Period  Weeks   Status Achieved     OT LONG TERM GOAL #6   Title Will now be able to go through a door that swings out using motorized wheel chair.   Baseline difficulty depending on type of door and set up of the area, at times requires max assistance depending on the weight of the door.   Time 6   Period Months   Status Achieved     OT LONG TERM GOAL  #9   Baseline Patient will be able to braid her own hair before college   Time 6   Period Months     OT LONG TERM GOAL  #10   TITLE Wll be able to wash her own hair before starting college   Baseline mom still has to help with more than 20% of task now down from 50%.     Time 6   Period Months   Status Achieved     OT LONG TERM GOAL  #11   TITLE Will be able to squeeze tooth paste out of the tube when it is less than half full.   Baseline still requires min assist if tube is less than 1/2 full.   Time 6   Period Months   Status On-going     OT LONG TERM GOAL  #12   TITLE Patient will demonstrate managing wheelchair rain poncho in inclement weather with modified independence.   Baseline crutch tips are difficult for anyone to take them off, goal deferred.   Time 6   Period Months   Status On-going     OT LONG TERM GOAL  #13   TITLE Patient will be able to complete 2 1/2 minutes straight with left shoulder flexion simulating driving with a stearing wheel ball to be able to drive to college.    Baseline can do 30 second of shoulder flexion with 2 1/2 before fatigue and can only drive 15 min before right shoulder fatigues.   Time 6   Period Months   Status On-going     OT LONG TERM GOAL  #14   TITLE Will be able to open taped boxes (for moving to college) using a key or knife.   Baseline ongoing techniques explored with adaptive devices to assist.   Time 6   Period Months   Status Deferred     OT LONG TERM GOAL  #15   TITLE Patient will be able to obtain food tray in the cafeteria from a wheelchair level and take to the table  without spilling with modified independence.    Baseline max assist   Time 6   Period Months   Status Achieved               Plan - 07/15/16 2102    Clinical Impression Statement Patient continues to make good progress in all areas. She was able to access all of her classes today independently and was pleased with the outcomes. Goals were updated to the reflect progress. Will continue to work towards ongoing goals for self care, homemaking as well as school related goals. Will add any new goals related to problems or issues regarding school related activities as patient attends more classes, engages in classroom assignments and performs note taking and completion of homework. Continue to recommend rain poncho for wheelchair use lightweight laptop computer to access Digital textbooks, assignments, and four in class notetaking.   Rehab Potential Good   OT Frequency 2x / week   OT Duration 12 weeks   OT Treatment/Interventions Self-care/ADL training;Therapeutic exercise;Therapeutic exercises;Therapeutic activities   Consulted and Agree with Plan of Care Patient      Patient will benefit from skilled therapeutic intervention in order to improve the following deficits and impairments:  Impaired UE functional use, Difficulty walking, Decreased strength, Decreased coordination, Decreased endurance, Decreased balance  Visit Diagnosis: Muscle weakness (generalized)  Other lack of coordination    Problem List There are no active problems to display for this patient.  Kerrie Buffalo, OTR/L, CLT  Jaycey Gens 07/15/2016, 9:06 PM  [place] [facility] MAIN [facility] SERVICES [address] [place], [place], [address] Phone: [phone]   Fax:  [phone]  Name: BURNICE VASSEL MRN: [id number] Date of Birth: Mar 19, 1996

## 2016-07-15 NOTE — Therapy (Signed)
[place] MAIN [facility] SERVICES [address] [place], [place], [address] Phone: [phone]   Fax:  [phone]  Occupational Therapy Treatment  Patient Details  Name: Michelle Randall MRN: [id number] Date of Birth: 1996-05-01 No Data Recorded  Encounter Date: 07/08/2016      OT End of Session - 07/14/16 2019    Visit Number 40   Number of Visits 70   Date for OT Re-Evaluation 07/09/16   Authorization Type medicaid visit 82 of 35   OT Start Time 1400   OT Stop Time 1525   OT Time Calculation (min) 85 min   Activity Tolerance Patient tolerated treatment well   Behavior During Therapy [facility] for tasks assessed/performed      History reviewed. No pertinent past medical history.  History reviewed. No pertinent surgical history.  There were no vitals filed for this visit.      Subjective Assessment - 07/14/16 2018    Subjective  Patient reports she is excited to start college. She is planning to attend orientation this weekend for three days. She will start classes next Tuesday on August 29.    Patient is accompained by: Family member   Patient Stated Goals wants to be independent with all tasks so she will be able to go away to college.   Currently in Pain? No/denies   Multiple Pain Sites No                      OT Treatments/Exercises (OP) - 07/15/16 2039      ADLs   ADL Comments Patient seen this date for site visit at [employer] where patient will be attending next week. Met with office of disabilities staff to map out patients class schedule and go to each building to determine handicap accessibility, identifying correct the worst for patient to utilize, and access to each buildings bathroom. Patient used her power wheelchair at times and loftstrand crutches at other times. She was able to access the bathrooms with both power wheelchair and loftstrand crutches with enough strength to open and close the doors. Also  visited cafeteria where patient will potentially eat lunch, determined access for food trays and recommendations for ways for patient to purchase food and options for carrying items to and from table. Beautiful each classroom and made recommendations for patient such as a store in the chemistry lab, options for parking power chair, access for sitting towards the front of the room and discussing with staff that patient will require bathroom does it every two hours. She does have a three hour class for chemistry on Fridays. Discussed options for note takers, and the need for her to be able to access a laptop computer for Note taking and digital versions of her textbooks. There was one electric door that was not operational in the [facility], The office of disabilities to follow up with maintenance to repair. Parents and sister patient were also present for visit.                 OT Education - 07/15/16 2040    Education provided Yes   Education Details accessibility on college campus, bathrooms, cafeteria and classroom set up, note taking, managing bookbag and rain poncho.accessibility on college campus, bathrooms, cafeteria and classroom set up, note taking, managing bookbag and rain poncho.   Person(s) Educated Patient;Parent(s)   Methods Explanation;Demonstration;Verbal cues   Comprehension Verbal cues required;Returned demonstration;Verbalized understanding  OT Long Term Goals - 06/14/16 1603      OT LONG TERM GOAL #1   Title Patient will demonstrate methods of picking up her bookbag and placing it onto the back of her chair with modified independence to transport items to class.   Baseline requires assistance from others to complete now but will need to be independent when at college in the fall .   Time 6   Period Months   Status Partially Met     OT LONG TERM GOAL #2   Title Patient will be able to make her bed with modified independence.   Baseline she  continues to have difficulty with balance and completing this task, moderate assist.   Time 6   Period Months   Status On-going     OT LONG TERM GOAL #3   Title Patient will demonstrate the ability to perform vacuuming of one room with modified independence using light weight vacuum.   Baseline still requires max assist   Time 6   Period Months   Status On-going     OT LONG TERM GOAL #5   Title Patient will demonstrate donning and doffing her bra with modified independence.   Baseline max assist   Time 12   Period Weeks   Status Achieved     OT LONG TERM GOAL #6   Title Will now be able to go through a door that swings out using motorized wheel chair.   Baseline difficulty depending on type of door and set up of the area, at times requires max assistance depending on the weight of the door.   Time 6   Period Months   Status On-going     OT LONG TERM GOAL  #9   Baseline Patient will be able to braid her own hair before college   Time 6   Period Months   Status On-going     OT LONG TERM GOAL  #10   TITLE Wll be able to wash her own hair before starting college   Baseline mom still has to help with more than 20% of task now down from 50%.     Time 6   Period Months   Status On-going     OT LONG TERM GOAL  #11   TITLE Will be able to squeeze tooth paste out of the tube when it is less than half full.   Baseline still requires min assist if tube is less than 1/2 full.   Time 6   Period Months   Status Partially Met     OT LONG TERM GOAL  #12   TITLE Patient will demonstrate managing wheelchair rain poncho in inclement weather with modified independence.   Baseline crutch tips are difficult for anyone to take them off, goal deferred.   Time 6   Period Months   Status On-going     OT LONG TERM GOAL  #13   TITLE Patient will be able to complete 2 1/2 minutes straight with left shoulder flexion simulating driving with a stearing wheel ball to be able to drive to college.     Baseline can do 30 second of shoulder flexion with 2 1/2 before fatigue and can only drive 15 min before right shoulder fatigues.   Time 6   Period Months   Status On-going     OT LONG TERM GOAL  #14   TITLE Will be able to open taped boxes (for moving to college) using a key  or knife.   Baseline ongoing techniques explored with adaptive devices to assist.   Time 6   Period Months   Status On-going     OT LONG TERM GOAL  #15   TITLE Patient will be able to obtain food tray in the cafeteria from a wheelchair level and take to the table without spilling with modified independence.    Baseline max assist   Time 6   Period Months   Status Partially Met               Plan - 07/14/16 2020    Clinical Impression Statement Patient will be transitioning to college next week and appears prepared, will continue to assess for needs that arise once she starts her classes and address any issues that you make additional recommendations. Will also work towards personal goals for self-care, Home activities, as well as school activities.   Rehab Potential Good   OT Frequency 2x / week   OT Duration 12 weeks   OT Treatment/Interventions Self-care/ADL training;Therapeutic exercise;Therapeutic exercises;Therapeutic activities   Consulted and Agree with Plan of Care Patient   Family Member Consulted father, mother, office of disabilities at [facility]      Patient will benefit from skilled therapeutic intervention in order to improve the following deficits and impairments:  Impaired UE functional use, Difficulty walking, Decreased strength, Decreased coordination, Decreased endurance, Decreased balance  Visit Diagnosis: Muscle weakness (generalized)  Other lack of coordination    Problem List There are no active problems to display for this patient.  Achilles Dunk, OTR/L, CLT  Michelle Randall 07/15/2016, 8:43 PM  [place] MAIN [facility]  SERVICES [address] [place], [place], [address] Phone: [phone]   Fax:  [phone]  Name: Michelle Randall MRN: [id number] Date of Birth: 1995/12/16

## 2016-07-20 ENCOUNTER — Ambulatory Visit: Payer: Medicaid Other | Attending: Pediatrics | Admitting: Physical Therapy

## 2016-07-20 ENCOUNTER — Ambulatory Visit: Payer: Medicaid Other | Admitting: Occupational Therapy

## 2016-07-20 ENCOUNTER — Encounter: Payer: Self-pay | Admitting: Physical Therapy

## 2016-07-20 DIAGNOSIS — M6281 Muscle weakness (generalized): Secondary | ICD-10-CM

## 2016-07-20 DIAGNOSIS — R278 Other lack of coordination: Secondary | ICD-10-CM

## 2016-07-20 DIAGNOSIS — R262 Difficulty in walking, not elsewhere classified: Secondary | ICD-10-CM | POA: Insufficient documentation

## 2016-07-20 DIAGNOSIS — R279 Unspecified lack of coordination: Secondary | ICD-10-CM | POA: Diagnosis present

## 2016-07-20 NOTE — Therapy (Signed)
[facility] [address] [place], [place], [address] Phone: [phone]   Fax:  [phone]  Physical Therapy Treatment  Patient Details  Name: Michelle Randall MRN: [id number] Date of Birth: October 02, 1996 No Data Recorded  Encounter Date: 07/20/2016      PT End of Session - 07/20/16 1639    Visit Number 52   Number of Visits 44   Date for PT Re-Evaluation 08/09/16   Authorization Type medicaid   PT Start Time 0320   PT Stop Time 0400   PT Time Calculation (min) 40 min   Equipment Utilized During Treatment Gait belt   Activity Tolerance Patient tolerated treatment well   Behavior During Therapy [facility] for tasks assessed/performed      History reviewed. No pertinent past medical history.  History reviewed. No pertinent surgical history.  There were no vitals filed for this visit.      Subjective Assessment - 07/20/16 1636    Subjective Patinet is doing well today. She is excited about her classes for college.   Patient is accompained by: Family member   Limitations Walking   Patient Stated Goals Patient wants to improve her core strength.    Pain Onset In the past 7 days       Therapeutic exercise: Side stepping in  parallel bars RTB x 10  x 5 reps Step ups x 10 x 2 balancing on blue foam with head turns and  Feet together x 5 repetitions TM walking with elevation and incline of 2 x 10 minutes Patient needs  constant cuing for posture and head position.                             PT Education - 07/20/16 1638    Education provided Yes   Education Details using the steps when she has time for strengthening legs   Person(s) Educated Patient   Methods Explanation   Comprehension Verbalized understanding             PT Long Term Goals - 05/26/16 1308      PT LONG TERM GOAL #1   Title Patient will improve Dynamic Gait Index (DGI) score to > 19/24 for low falls risk regarding  dynamic walking tasks    Time 12   Period Weeks   Status On-going     PT LONG TERM GOAL #2   Title Patient will increase Berg Balance score by > 6 points to demonstrate decreased fall risk during functional activities    Time 12   Status On-going     PT LONG TERM GOAL #3   Title Patient will be able to transfer in and out of a large car with Patient will be able to transfer in and out of a high seated car with CGA    Time 12   Period Weeks   Status On-going     PT LONG TERM GOAL #4   Title Patient will complete a TUG test in < 12 seconds for independent mobility and decreased fall risk    Time 12   Period Weeks   Status On-going     PT LONG TERM GOAL #6   Title Patient will improve gait speed to > 1.2 m/s with least restrictive assistive device to return to normal walking speed    Time 12   Period Weeks   Status On-going     PT LONG TERM  GOAL #7   Title Patient (< 18 years old) will complete five times sit to stand test in < 10 seconds indicating an increased LE strength and improved balance    Baseline Completed 05/07/2015 (9.85 seconds)      PT LONG TERM GOAL #8   Title Patient will be able to ambulate on inclines and grass independenlty with LRAD   Period Weeks   Status New     PT LONG TERM GOAL  #9   TITLE Patient will be abe to transfer from low chair or stool wihtout UE support independently   Period Weeks   Status On-going     PT LONG TERM GOAL  #10   TITLE Patient will be able to transfer from the floor to standing independently with use of LRAD   Time 12   Period Weeks   Status On-going               Plan - 07/20/16 1639    Clinical Impression Statement Pt requires mod verbal and tactile cues for proper exercise performance    Rehab Potential Good   Clinical Impairments Affecting Rehab Potential weakness and decreased standing balance   PT Frequency 1x / week   PT Duration 12 weeks   PT Treatment/Interventions Therapeutic exercise;Therapeutic  activities;Gait training;Balance training;Stair training;DME Instruction;Neuromuscular re-education;Patient/family education   PT Next Visit Plan progress LE, core strengthening and balance   PT Home Exercise Plan Continued from previous sessions.    Consulted and Agree with Plan of Care Patient      Patient will benefit from skilled therapeutic intervention in order to improve the following deficits and impairments:  Abnormal gait, Decreased balance, Decreased endurance, Difficulty walking, Decreased strength  Visit Diagnosis: Other lack of coordination     Problem List There are no active problems to display for this patient.  [name], PT, DPT [place], [place] S 07/20/2016, 4:41 PM  [place] [facility] MAIN [facility] SERVICES [address] [place], [place], [address] Phone: [phone]   Fax:  [phone]  Name: Michelle Randall MRN: [id number] Date of Birth: [birth year]

## 2016-07-21 ENCOUNTER — Ambulatory Visit: Payer: Medicaid Other | Admitting: Physical Therapy

## 2016-07-21 NOTE — Therapy (Signed)
[place] [facility] MAIN [facility] SERVICES [address] [place], [place], [address] Phone: [phone]   Fax:  [phone]  Occupational Therapy Treatment  Patient Details  Name: Michelle Randall MRN: [id number] Date of Birth: Jan 24, 1996 No Data Recorded  Encounter Date: 07/20/2016      OT End of Session - 07/21/16 1658    Visit Number 42   Number of Visits 70   Date for OT Re-Evaluation 10/05/16   Authorization Type medicaid visit 21 of 48   OT Start Time 1429   OT Stop Time 1515   OT Time Calculation (min) 46 min   Activity Tolerance Patient tolerated treatment well   Behavior During Therapy [facility] for tasks assessed/performed      No past medical history on file.  No past surgical history on file.  There were no vitals filed for this visit.      Subjective Assessment - 07/21/16 1658    Subjective  Patient reports school is going well but she has difficulty managing a drink in her wheelchair when she is also carrying a snack. Would like to look for a cupholder for her wheelchair.   Patient is accompained by: Family member   Patient Stated Goals wants to be independent with all tasks so she will be able to go away to college.   Currently in Pain? No/denies   Pain Score 0-No pain                      OT Treatments/Exercises (OP) - 07/21/16 0001      ADLs   ADL Comments Patient was seen for recommendations regarding chair cupholder. Patient will not be able to use a mounted cup holder since her wheelchair does not have any pole mounts in the front. She would do best with a Velcro cupholder for her chair. Issued written/picture of recommendation and shared with dad.      Fine Motor Coordination   Other Fine Motor Exercises Manipulation of 100 pegboard to flip items from one end to the other with cues for right hand coordination     Neurological Re-education Exercises   Other Exercises 1 Patient was seen for strengthening tasks  with bilateral upper extremity's with resistive pinch pins of all levels placed in an elevated plane of motion. Multi level reaching with 1 pound in all directions for 25 repetitions with cues. 3 pound dowel exercises for shoulder flexion, abduction, abduction, chest press, and elbow flexion extension for 15 repetitions for two sets.                OT Education - 07/21/16 1658    Education provided Yes   Education Details cupholder for wheelchair   Person(s) Educated Patient;Parent(s)   Methods Explanation;Demonstration;Verbal cues   Comprehension Verbal cues required;Returned demonstration;Verbalized understanding             OT Long Term Goals - 07/15/16 2103      OT LONG TERM GOAL #1   Title Patient will demonstrate methods of picking up her bookbag and placing it onto the back of her chair with modified independence to transport items to class.   Baseline requires assistance from others to complete now but will need to be independent when at college in the fall .   Time 6   Period Months   Status On-going     OT LONG TERM GOAL #2   Title Patient will be able to make her bed with modified  independence.   Baseline she continues to have difficulty with balance and completing this task, moderate assist.   Time 6   Period Months     OT LONG TERM GOAL #3   Title Patient will demonstrate the ability to perform vacuuming of one room with modified independence using light weight vacuum.   Baseline still requires max assist   Time 6   Period Months   Status On-going     OT LONG TERM GOAL #5   Title Patient will demonstrate donning and doffing her bra with modified independence.   Baseline max assist   Time 12   Period Weeks   Status Achieved     OT LONG TERM GOAL #6   Title Will now be able to go through a door that swings out using motorized wheel chair.   Baseline difficulty depending on type of door and set up of the area, at times requires max assistance  depending on the weight of the door.   Time 6   Period Months   Status Achieved     OT LONG TERM GOAL  #9   Baseline Patient will be able to braid her own hair before college   Time 6   Period Months     OT LONG TERM GOAL  #10   TITLE Wll be able to wash her own hair before starting college   Baseline mom still has to help with more than 20% of task now down from 50%.     Time 6   Period Months   Status Achieved     OT LONG TERM GOAL  #11   TITLE Will be able to squeeze tooth paste out of the tube when it is less than half full.   Baseline still requires min assist if tube is less than 1/2 full.   Time 6   Period Months   Status On-going     OT LONG TERM GOAL  #12   TITLE Patient will demonstrate managing wheelchair rain poncho in inclement weather with modified independence.   Baseline crutch tips are difficult for anyone to take them off, goal deferred.   Time 6   Period Months   Status On-going     OT LONG TERM GOAL  #13   TITLE Patient will be able to complete 2 1/2 minutes straight with left shoulder flexion simulating driving with a stearing wheel ball to be able to drive to college.    Baseline can do 30 second of shoulder flexion with 2 1/2 before fatigue and can only drive 15 min before right shoulder fatigues.   Time 6   Period Months   Status On-going     OT LONG TERM GOAL  #14   TITLE Will be able to open taped boxes (for moving to college) using a key or knife.   Baseline ongoing techniques explored with adaptive devices to assist.   Time 6   Period Months   Status Deferred     OT LONG TERM GOAL  #15   TITLE Patient will be able to obtain food tray in the cafeteria from a wheelchair level and take to the table without spilling with modified independence.    Baseline max assist   Time 6   Period Months   Status Achieved               Plan - 07/21/16 1659    Clinical Impression Statement Patient is progressing well with her transition to  college. She has been able to access her classes and take notes with missing minimal bullet points. She is having difficulty managing a drink along with food in her wheelchair. Made recommendations this day regarding wheelchair mounted cupholder. She has not received her computer yet and is having to borrow her sisters which is heavier than she has difficulty picking up the book bag from the back of her wheelchair. 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   Rehab Potential Good   OT Frequency 2x / week   OT Duration 12 weeks   OT Treatment/Interventions Self-care/ADL training;Therapeutic exercise;Therapeutic exercises;Therapeutic activities   Consulted and Agree with Plan of Care Patient   Family Member Consulted father, mother      Patient will benefit from skilled therapeutic intervention in order to improve the following deficits and impairments:  Impaired UE functional use, Difficulty walking, Decreased strength, Decreased coordination, Decreased endurance, Decreased balance  Visit Diagnosis: Other lack of coordination  Muscle weakness (generalized)    Problem List There are no active problems to display for this patient.  Kerrie Buffalo, OTR/L, CLT Lovett,Amy 07/21/2016, 8:52 PM  [place] [facility] MAIN [facility] SERVICES [address] [place], [place], [address] Phone: [phone]   Fax:  [phone]  Name: KYONA CHAUNCEY MRN: [id number] Date of Birth: 1996-01-11

## 2016-07-27 ENCOUNTER — Encounter: Payer: Self-pay | Admitting: Physical Therapy

## 2016-07-27 ENCOUNTER — Ambulatory Visit: Payer: Medicaid Other | Admitting: Physical Therapy

## 2016-07-27 ENCOUNTER — Encounter: Payer: Self-pay | Admitting: Occupational Therapy

## 2016-07-27 ENCOUNTER — Ambulatory Visit: Payer: Medicaid Other | Admitting: Occupational Therapy

## 2016-07-27 DIAGNOSIS — R278 Other lack of coordination: Secondary | ICD-10-CM | POA: Diagnosis not present

## 2016-07-27 DIAGNOSIS — R262 Difficulty in walking, not elsewhere classified: Secondary | ICD-10-CM

## 2016-07-27 DIAGNOSIS — M6281 Muscle weakness (generalized): Secondary | ICD-10-CM

## 2016-07-27 NOTE — Therapy (Signed)
[place] [facility] [address] [place], [place], [address] Phone: [phone]   Fax:  [phone]  Physical Therapy Treatment  Patient Details  Name: Michelle HuaMisbah N Randall MRN: [id number] Date of Birth: May 27, 1996 No Data Recorded  Encounter Date: 07/27/2016      PT End of Session - 07/27/16 1531    Visit Number 53   Date for PT Re-Evaluation 08/09/16   Authorization Type medicaid   PT Start Time 0315   PT Stop Time 0400   PT Time Calculation (min) 45 min   Equipment Utilized During Treatment Gait belt   Activity Tolerance Patient tolerated treatment well   Behavior During Therapy [facility] for tasks assessed/performed      History reviewed. No pertinent past medical history.  History reviewed. No pertinent surgical history.  There were no vitals filed for this visit.      Subjective Assessment - 07/27/16 1527    Subjective Patient is doing well today and has no complaints.   Limitations Walking   Patient Stated Goals Patient wants to improve her core strength.       Therapeutic exercise and neuromuscular training:     standing hip abd with YTB x 20   side stepping left and right in parallel bars 10 feet x 3 step ups from floor to 6 inch stool x 20 bilateral marching in parallel bars x 20 Balancing on foam no UE support Tm walking 1. 0 m/hour x 5 mins  Patient needs occasional verbal cueing to improve posture and cueing to correctly perform exercises slowly, holding at end of range to increase motor firing of desired muscle to encourage fatigue.                           PT Education - 07/27/16 1530    Education provided Yes   Education Details big steps with step ups    Person(s) Educated Patient   Methods Explanation   Comprehension Verbalized understanding             PT Long Term Goals - 05/26/16 1308      PT LONG TERM GOAL #1   Title Patient will improve Dynamic Gait Index  (DGI) score to > 19/24 for low falls risk regarding dynamic walking tasks    Time 12   Period Weeks   Status On-going     PT LONG TERM GOAL #2   Title Patient will increase Berg Balance score by > 6 points to demonstrate decreased fall risk during functional activities    Time 12   Status On-going     PT LONG TERM GOAL #3   Title Patient will be able to transfer in and out of a large car with Patient will be able to transfer in and out of a high seated car with CGA    Time 12   Period Weeks   Status On-going     PT LONG TERM GOAL #4   Title Patient will complete a TUG test in < 12 seconds for independent mobility and decreased fall risk    Time 12   Period Weeks   Status On-going     PT LONG TERM GOAL #6   Title Patient will improve gait speed to > 1.2 m/s with least restrictive assistive device to return to normal walking speed    Time 12   Period Weeks   Status On-going     PT  LONG TERM GOAL #7   Title Patient (< 69 years old) will complete five times sit to stand test in < 10 seconds indicating an increased LE strength and improved balance    Baseline Completed 05/07/2015 (9.85 seconds)      PT LONG TERM GOAL #8   Title Patient will be able to ambulate on inclines and grass independenlty with LRAD   Period Weeks   Status New     PT LONG TERM GOAL  #9   TITLE Patient will be abe to transfer from low chair or stool wihtout UE support independently   Period Weeks   Status On-going     PT LONG TERM GOAL  #10   TITLE Patient will be able to transfer from the floor to standing independently with use of LRAD   Time 12   Period Weeks   Status On-going               Plan - 07/27/16 1531    Clinical Impression Statement Cueing was needed during the leg press to control motion out/back; frequent instances of decreased eccentric control were exhibited. Min cueing needed during hip 3-way to maintain upright posture with knees straight. Pt had good performance of  therapeutic exercise today   Rehab Potential Good   Clinical Impairments Affecting Rehab Potential weakness and decreased standing balance   PT Frequency 1x / week   PT Duration 12 weeks   PT Treatment/Interventions Therapeutic exercise;Therapeutic activities;Gait training;Balance training;Stair training;DME Instruction;Neuromuscular re-education;Patient/family education   PT Next Visit Plan progress LE, core strengthening and balance   PT Home Exercise Plan Continued from previous sessions.    Consulted and Agree with Plan of Care Patient      Patient will benefit from skilled therapeutic intervention in order to improve the following deficits and impairments:  Abnormal gait, Decreased balance, Decreased endurance, Difficulty walking, Decreased strength  Visit Diagnosis: Other lack of coordination  Muscle weakness (generalized)  Difficulty in walking, not elsewhere classified     Problem List There are no active problems to display for this patient.  [name], PT, DPT [place], [place] S 07/27/2016, 3:45 PM  [place] [facility] MAIN [facility] SERVICES [address] [place], [place], [address] Phone: [phone]   Fax:  [phone]  Name: Michelle Randall MRN: [id number] Date of Birth: 02-Oct-1996

## 2016-07-28 ENCOUNTER — Ambulatory Visit: Payer: Medicaid Other | Admitting: Physical Therapy

## 2016-07-28 NOTE — Therapy (Signed)
[place] [facility] MAIN [facility] SERVICES [address] [place], [place], [address] Phone: [phone]   Fax:  [phone]  Occupational Therapy Treatment  Patient Details  Name: Michelle Randall MRN: [id number] Date of Birth: [birth year] No Data Recorded  Encounter Date: 07/27/2016      OT End of Session - 07/27/16 1448    Visit Number 43   Number of Visits 70   Date for OT Re-Evaluation 10/05/16   Authorization Type medicaid visit 22 of 48   OT Start Time 1430   OT Stop Time 1514   OT Time Calculation (min) 44 min   Activity Tolerance Patient tolerated treatment well   Behavior During Therapy [facility] for tasks assessed/performed      History reviewed. No pertinent past medical history.  History reviewed. No pertinent surgical history.  There were no vitals filed for this visit.      Subjective Assessment - 07/27/16 1436    Subjective  Patient reports she is having difficulty with walking to get to the library to get her wheelchair when it is raining outside because they have to park so far away.  She has contacted the office of disabilities and asking if she can get one of the golf carts to pick her up.  She is waiting for an answer.    Patient Stated Goals wants to be independent with all tasks so she will be able to go away to college.   Currently in Pain? No/denies   Pain Score 0-No pain                      OT Treatments/Exercises (OP) - 07/27/16 1453      ADLs   Writing Patient seen for timed handwriting trials to work on speed, accuracy and legibility for 5 minute time trialed for 2 trials, using regular pen and wide lined paper.  Use of scissors in right hand to cut out forms to place onto bulletin board, cues provided for hand positioning.    ADL Comments Patient had difficulty this week applying push pins to bulletin board for display of papers, worked towards using bulletin board and pins in the clinic to hang handwriting  samples from session this date, difficulty noted with pushing pins in from 1/2 way to fully in.       Neurological Re-education Exercises   Other Exercises 1 Patient seen for right hand resistive putty exercises for gross grip and pinch skills with occasional guiding for hand placement and cues for techniques and variety of pinches.                 OT Education - 07/27/16 1447    Education provided Yes   Education Details possible options for getting to wheelchair when its raining in college.  HEP   Person(s) Educated Patient   Methods Explanation;Demonstration;Verbal cues   Comprehension Verbal cues required;Returned demonstration;Verbalized understanding             OT Long Term Goals - 07/15/16 2103      OT LONG TERM GOAL #1   Title Patient will demonstrate methods of picking up her bookbag and placing it onto the back of her chair with modified independence to transport items to class.   Baseline requires assistance from others to complete now but will need to be independent when at college in the fall .   Time 6   Period Months   Status On-going  OT LONG TERM GOAL #2   Title Patient will be able to make her bed with modified independence.   Baseline she continues to have difficulty with balance and completing this task, moderate assist.   Time 6   Period Months     OT LONG TERM GOAL #3   Title Patient will demonstrate the ability to perform vacuuming of one room with modified independence using light weight vacuum.   Baseline still requires max assist   Time 6   Period Months   Status On-going     OT LONG TERM GOAL #5   Title Patient will demonstrate donning and doffing her bra with modified independence.   Baseline max assist   Time 12   Period Weeks   Status Achieved     OT LONG TERM GOAL #6   Title Will now be able to go through a door that swings out using motorized wheel chair.   Baseline difficulty depending on type of door and set up of the  area, at times requires max assistance depending on the weight of the door.   Time 6   Period Months   Status Achieved     OT LONG TERM GOAL  #9   Baseline Patient will be able to braid her own hair before college   Time 6   Period Months     OT LONG TERM GOAL  #10   TITLE Wll be able to wash her own hair before starting college   Baseline mom still has to help with more than 20% of task now down from 50%.     Time 6   Period Months   Status Achieved     OT LONG TERM GOAL  #11   TITLE Will be able to squeeze tooth paste out of the tube when it is less than half full.   Baseline still requires min assist if tube is less than 1/2 full.   Time 6   Period Months   Status On-going     OT LONG TERM GOAL  #12   TITLE Patient will demonstrate managing wheelchair rain poncho in inclement weather with modified independence.   Baseline crutch tips are difficult for anyone to take them off, goal deferred.   Time 6   Period Months   Status On-going     OT LONG TERM GOAL  #13   TITLE Patient will be able to complete 2 1/2 minutes straight with left shoulder flexion simulating driving with a stearing wheel ball to be able to drive to college.    Baseline can do 30 second of shoulder flexion with 2 1/2 before fatigue and can only drive 15 min before right shoulder fatigues.   Time 6   Period Months   Status On-going     OT LONG TERM GOAL  #14   TITLE Will be able to open taped boxes (for moving to college) using a key or knife.   Baseline ongoing techniques explored with adaptive devices to assist.   Time 6   Period Months   Status Deferred     OT LONG TERM GOAL  #15   TITLE Patient will be able to obtain food tray in the cafeteria from a wheelchair level and take to the table without spilling with modified independence.    Baseline max assist   Time 6   Period Months   Status Achieved               Plan -  07/27/16 1448    Clinical Impression Statement Patient continues  to progress with tasks in the clinic related to school and home.  She is having difficulty with strength to use push pins on bulletin board to hang items, especially when board is on the wall, if board is flat on table, she can use more leverage to complete the task.  Handwriting remains limited with speed however, legibility is good.  Continue to work towards tasks which help advance skills for necessary daily tasks.    Rehab Potential Good   OT Frequency 2x / week   OT Duration 12 weeks   OT Treatment/Interventions Self-care/ADL training;Therapeutic exercise;Therapeutic exercises;Therapeutic activities   Consulted and Agree with Plan of Care Patient      Patient will benefit from skilled therapeutic intervention in order to improve the following deficits and impairments:  Impaired UE functional use, Difficulty walking, Decreased strength, Decreased coordination, Decreased endurance, Decreased balance  Visit Diagnosis: Other lack of coordination  Muscle weakness (generalized)    Problem List There are no active problems to display for this patient.  Kerrie Buffalo, OTR/L, CLT  Emma-Lee Oddo 07/28/2016, 3:01 PM  [place] [facility] MAIN [place] [facility] SERVICES [address] [place], [place], [address] Phone: [phone]   Fax:  [phone]  Name: Michelle Randall MRN: [id number] Date of Birth: 1996-01-14

## 2016-08-03 ENCOUNTER — Encounter: Payer: Self-pay | Admitting: Occupational Therapy

## 2016-08-03 ENCOUNTER — Ambulatory Visit: Payer: Medicaid Other | Admitting: Physical Therapy

## 2016-08-03 ENCOUNTER — Ambulatory Visit: Payer: Medicaid Other | Admitting: Occupational Therapy

## 2016-08-03 ENCOUNTER — Encounter: Payer: Self-pay | Admitting: Physical Therapy

## 2016-08-03 DIAGNOSIS — R279 Unspecified lack of coordination: Secondary | ICD-10-CM

## 2016-08-03 DIAGNOSIS — M6281 Muscle weakness (generalized): Secondary | ICD-10-CM

## 2016-08-03 DIAGNOSIS — R278 Other lack of coordination: Secondary | ICD-10-CM | POA: Diagnosis not present

## 2016-08-03 NOTE — Therapy (Signed)
[place] [facility] [address] [place], [place], [address] Phone: [phone]   Fax:  [phone]  Physical Therapy Treatment  Patient Details  Name: Michelle Randall MRN: [id number] Date of Birth: 18-Mar-1996 No Data Recorded  Encounter Date: 08/03/2016      PT End of Session - 08/03/16 1524    Visit Number 54   Date for PT Re-Evaluation 08/09/16   Authorization Type medicaid   PT Start Time 0315   PT Stop Time 0400   PT Time Calculation (min) 45 min   Equipment Utilized During Treatment Gait belt   Activity Tolerance Patient tolerated treatment well   Behavior During Therapy [facility] for tasks assessed/performed      History reviewed. No pertinent past medical history.  History reviewed. No pertinent surgical history.  There were no vitals filed for this visit.      Subjective Assessment - 08/03/16 1521    Subjective Patient is doing well today and has no complaints.   Patient is accompained by: Family member   Limitations Walking      Therapeutic exercise: TM walking at 1.0 miles / hour x 10 mintues Leg press with 90 lbs x 20 x 3 Side stepping in parallel  Bars x 10 x 2  Side stepping in parallel bars with blue foam x 10 feet x 2 Hip abd and extension with YTB x 15 x 3 Patient needs occasional verbal cueing to improve posture and cueing to correctly perform exercises slowly, holding at end of range to increase motor firing of desired muscle to encourage fatigue.                           PT Education - 08/03/16 1523    Education provided Yes   Education Details HEP   Person(s) Educated Patient   Methods Explanation   Comprehension Verbalized understanding             PT Long Term Goals - 05/26/16 1308      PT LONG TERM GOAL #1   Title Patient will improve Dynamic Gait Index (DGI) score to > 19/24 for low falls risk regarding dynamic walking tasks    Time 12   Period Weeks   Status On-going     PT LONG TERM GOAL #2   Title Patient will increase Berg Balance score by > 6 points to demonstrate decreased fall risk during functional activities    Time 12   Status On-going     PT LONG TERM GOAL #3   Title Patient will be able to transfer in and out of a large car with Patient will be able to transfer in and out of a high seated car with CGA    Time 12   Period Weeks   Status On-going     PT LONG TERM GOAL #4   Title Patient will complete a TUG test in < 12 seconds for independent mobility and decreased fall risk    Time 12   Period Weeks   Status On-going     PT LONG TERM GOAL #6   Title Patient will improve gait speed to > 1.2 m/s with least restrictive assistive device to return to normal walking speed    Time 12   Period Weeks   Status On-going     PT LONG TERM GOAL #7   Title Patient (< 19 years old) will complete five times sit to  stand test in < 10 seconds indicating an increased LE strength and improved balance    Baseline Completed 05/07/2015 (9.85 seconds)      PT LONG TERM GOAL #8   Title Patient will be able to ambulate on inclines and grass independenlty with LRAD   Period Weeks   Status New     PT LONG TERM GOAL  #9   TITLE Patient will be abe to transfer from low chair or stool wihtout UE support independently   Period Weeks   Status On-going     PT LONG TERM GOAL  #10   TITLE Patient will be able to transfer from the floor to standing independently with use of LRAD   Time 12   Period Weeks   Status On-going               Plan - 08/03/16 1530    Clinical Impression Statement Patient needs reminders to push from the chair or mat, to lean fwd, to bend her knees and keep her feet under her better and to lean fwd enough to raise out of the chair.   Rehab Potential Good   Clinical Impairments Affecting Rehab Potential weakness and decreased standing balance   PT Frequency 1x / week   PT Duration 12 weeks   PT  Treatment/Interventions Therapeutic exercise;Therapeutic activities;Gait training;Balance training;Stair training;DME Instruction;Neuromuscular re-education;Patient/family education   PT Next Visit Plan progress LE, core strengthening and balance   PT Home Exercise Plan Continued from previous sessions.    Consulted and Agree with Plan of Care Patient      Patient will benefit from skilled therapeutic intervention in order to improve the following deficits and impairments:  Abnormal gait, Decreased balance, Decreased endurance, Difficulty walking, Decreased strength  Visit Diagnosis: Muscle weakness (generalized)  Lack of coordination     Problem List There are no active problems to display for this patient.  [name], PT, DPT [place], Barkley Bruns S 08/03/2016, 3:33 PM  [place] [facility] MAIN [facility] SERVICES [address] [place], [place], [address] Phone: [phone]   Fax:  [phone]  Name: Michelle Randall MRN: [id number] Date of Birth: [birth year]

## 2016-08-04 ENCOUNTER — Ambulatory Visit: Payer: Medicaid Other | Admitting: Physical Therapy

## 2016-08-09 ENCOUNTER — Encounter: Payer: Self-pay | Admitting: Occupational Therapy

## 2016-08-09 NOTE — Therapy (Signed)
[place] [facility] MAIN [facility] SERVICES [address] [place], [place], [address] Phone: [phone]   Fax:  [phone]  Occupational Therapy Treatment  Patient Details  Name: Michelle Randall MRN: [id number] Date of Birth: Dec 17, 1995 No Data Recorded  Encounter Date: 08/03/2016      OT End of Session - 08/09/16 0536    Visit Number 44   Number of Visits 70   Date for OT Re-Evaluation 10/05/16   Authorization Type medicaid visit 23 of 48   OT Start Time 1429   OT Stop Time 1515   OT Time Calculation (min) 46 min   Activity Tolerance Patient tolerated treatment well   Behavior During Therapy [facility] for tasks assessed/performed      History reviewed. No pertinent past medical history.  History reviewed. No pertinent surgical history.  There were no vitals filed for this visit.      Subjective Assessment - 08/09/16 0524    Subjective  Patient reports she got a couple of shots today.  School is going well, had a chemistry test yesterday.  Wants to work on pinch and coordination today.  She was denied for a computer by the [employer] program.     Patient Stated Goals wants to be independent with all tasks so she will be able to go away to college.   Currently in Pain? No/denies   Pain Score 0-No pain                      OT Treatments/Exercises (OP) - 08/09/16 0524      ADLs   ADL Comments Patient seen this date with use of scissors in right hand for cutting as well as tweezers for manipulation of small objects with emphasis on index/thumb prehension pattern with controlled pinch and pressure with cues as needed.      Neurological Re-education Exercises   Other Exercises 1 Patient seen for BUE strengthening exercises with 2# weight on wrist, reaching to complete shape tower all four levels with multiple stacks with cues for technique and directions.  Resistive pinch pins all levels placing onto elevated surface with both right and left  UE.  Manipulation of small washers to place onto long dowels in variety of elevated directions for combined reach, manipulation and strengthening with 2# weights.                 OT Education - 08/09/16 0535    Education provided Yes   Education Details coordination, strength, HEP   Person(s) Educated Patient   Methods Explanation;Demonstration;Verbal cues   Comprehension Verbal cues required;Returned demonstration;Verbalized understanding             OT Long Term Goals - 07/15/16 2103      OT LONG TERM GOAL #1   Title Patient will demonstrate methods of picking up her bookbag and placing it onto the back of her chair with modified independence to transport items to class.   Baseline requires assistance from others to complete now but will need to be independent when at college in the fall .   Time 6   Period Months   Status On-going     OT LONG TERM GOAL #2   Title Patient will be able to make her bed with modified independence.   Baseline she continues to have difficulty with balance and completing this task, moderate assist.   Time 6   Period Months     OT LONG TERM GOAL #3  Title Patient will demonstrate the ability to perform vacuuming of one room with modified independence using light weight vacuum.   Baseline still requires max assist   Time 6   Period Months   Status On-going     OT LONG TERM GOAL #5   Title Patient will demonstrate donning and doffing her bra with modified independence.   Baseline max assist   Time 12   Period Weeks   Status Achieved     OT LONG TERM GOAL #6   Title Will now be able to go through a door that swings out using motorized wheel chair.   Baseline difficulty depending on type of door and set up of the area, at times requires max assistance depending on the weight of the door.   Time 6   Period Months   Status Achieved     OT LONG TERM GOAL  #9   Baseline Patient will be able to braid her own hair before college    Time 6   Period Months     OT LONG TERM GOAL  #10   TITLE Wll be able to wash her own hair before starting college   Baseline mom still has to help with more than 20% of task now down from 50%.     Time 6   Period Months   Status Achieved     OT LONG TERM GOAL  #11   TITLE Will be able to squeeze tooth paste out of the tube when it is less than half full.   Baseline still requires min assist if tube is less than 1/2 full.   Time 6   Period Months   Status On-going     OT LONG TERM GOAL  #12   TITLE Patient will demonstrate managing wheelchair rain poncho in inclement weather with modified independence.   Baseline crutch tips are difficult for anyone to take them off, goal deferred.   Time 6   Period Months   Status On-going     OT LONG TERM GOAL  #13   TITLE Patient will be able to complete 2 1/2 minutes straight with left shoulder flexion simulating driving with a stearing wheel ball to be able to drive to college.    Baseline can do 30 second of shoulder flexion with 2 1/2 before fatigue and can only drive 15 min before right shoulder fatigues.   Time 6   Period Months   Status On-going     OT LONG TERM GOAL  #14   TITLE Will be able to open taped boxes (for moving to college) using a key or knife.   Baseline ongoing techniques explored with adaptive devices to assist.   Time 6   Period Months   Status Deferred     OT LONG TERM GOAL  #15   TITLE Patient will be able to obtain food tray in the cafeteria from a wheelchair level and take to the table without spilling with modified independence.    Baseline max assist   Time 6   Period Months   Status Achieved               Plan - 08/09/16 0537    Clinical Impression Statement Patient continues to benefit from skilled OT to address limitations in strength, coordination and bilateral UE use required for her necessary daily home tasks, as well as school related tasks.  She is improving with speed and dexterity along  with manipulation of items and  has progressed well with being able to lift and access her computer from her bookbag at school. Continue to work towards these skills.    Rehab Potential Good   OT Frequency 2x / week   OT Duration 12 weeks   OT Treatment/Interventions Self-care/ADL training;Therapeutic exercise;Therapeutic exercises;Therapeutic activities   Consulted and Agree with Plan of Care Patient      Patient will benefit from skilled therapeutic intervention in order to improve the following deficits and impairments:  Impaired UE functional use, Difficulty walking, Decreased strength, Decreased coordination, Decreased endurance, Decreased balance  Visit Diagnosis: Muscle weakness (generalized)  Lack of coordination    Problem List There are no active problems to display for this patient.  Kerrie Buffalomy T Lovett, OTR/L, CLT  Lovett,Amy 08/09/2016, 5:41 AM  [place] [facility] [address] [place], [place], [address] Phone: [phone]   Fax:  [phone]  Name: [name] MRN: [id number] Date of Birth: 1995/12/16

## 2016-08-10 ENCOUNTER — Ambulatory Visit: Payer: Medicaid Other | Admitting: Physical Therapy

## 2016-08-10 ENCOUNTER — Ambulatory Visit: Payer: Medicaid Other | Admitting: Occupational Therapy

## 2016-08-10 ENCOUNTER — Encounter: Payer: Self-pay | Admitting: Physical Therapy

## 2016-08-10 DIAGNOSIS — R278 Other lack of coordination: Secondary | ICD-10-CM

## 2016-08-10 DIAGNOSIS — M6281 Muscle weakness (generalized): Secondary | ICD-10-CM

## 2016-08-10 DIAGNOSIS — R262 Difficulty in walking, not elsewhere classified: Secondary | ICD-10-CM

## 2016-08-10 DIAGNOSIS — R279 Unspecified lack of coordination: Secondary | ICD-10-CM

## 2016-08-10 NOTE — Therapy (Addendum)
[facility] MAIN [facility] [address] [place], [place], [address] Phone: [phone]   Fax:  [phone]  Physical Therapy Treatment  Patient Details  Name: Michelle Randall MRN: [id number] Date of Birth: 18-Apr-1996 No Data Recorded  Encounter Date: 08/10/2016    History reviewed. No pertinent past medical history.  History reviewed. No pertinent surgical history.  There were no vitals filed for this visit.       OUTCOME MEASURES: TEST  Outcome  Interpretation   5 times sit<>stand  10.56sec  24>60 yo, >15 sec indicates increased risk for falls   10 meter walk test   .80            m/s  <1.0 m/s indicates increased risk for falls; limited community ambulator   Timed up and Go  11.20              sec  <14 sec indicates increased risk for falls   6 minute walk test   640            Feet  1000 feet is community ambulator      TM walking with elevation of 2 and 1. 0 miles / hour Leg press 90 lbs x 20 x 3 NMR: 1/2 foam feet side by side in parallel bars     Patient is progressing towards all goals and continues to benefit from skilled PT to improve posture during gait, and decrease her falls risk.  She has a score of 14/24 on her  DGI, and . 8 m/sec gait speed both outcome meausres indicating a fall risk.  She is recently having difficulty raising up her RLE to ascend steps safety, and has caught her foot causing her to stumble.  She is going to a college campus  and needs to be safe on ascending and descending a curb and performing car transfers. She needs supervision with car transfers and continues to have LE weakness in both hips and knees. She will benefit from skilled PT to improve ambulation skills on uneven surfaces and inclines, and continuing  to practice getting up from the ground level, and to progress her HEP.                              PT Long Term Goals - 08/17/16 0942      PT LONG TERM GOAL #1   Title Patient will improve Dynamic Gait Index (DGI) score to > 19/24 for low falls risk regarding dynamic walking tasks    Baseline DGI 14/24   Time 12   Status On-going     PT LONG TERM GOAL #2   Title Patient will increase Berg Balance score by > 6 points to demonstrate decreased fall risk during functional activities    Time 12   Status On-going     PT LONG TERM GOAL #3   Title Patient will be able to transfer in and out of a large car with Patient will be able to transfer in and out of a high seated car with CGA    Baseline Patient requires min for transfer into large car   Time 12   Status On-going     PT LONG TERM GOAL #4   Title Patient will complete a TUG test in < 12 seconds for independent mobility and decreased fall risk    Baseline 11.20 sec   Time 12   Period Weeks  Status On-going     PT LONG TERM GOAL #5   Title Patient will improve 6 minute walk distance by > 150 ft for improved return to functional community activities    Baseline 640 feet   Time 12   Status On-going     PT LONG TERM GOAL #6   Title Patient will improve gait speed to > 1.2 m/s with least restrictive assistive device to return to normal walking speed    Baseline .80 m/sec   Time 12   Period Weeks   Status On-going     PT LONG TERM GOAL #7   Title Patient (< 27 years old) will complete five times sit to stand test in < 10 seconds indicating an increased LE strength and improved balance    Time 10.56   Status On-going     PT LONG TERM GOAL #8   Title Patient will be able to ambulate on inclines and grass independenlty with LRAD   Baseline Patient needs SBA for ambulation in grass or ueven surfaces   Time 12   Period Weeks   Status On-going     PT LONG TERM GOAL  #9   TITLE Patient will be abe to transfer from low chair or stool wihtout UE support independently   Baseline Patient needs min asssit to transfer from a low stool   Time 12   Status On-going     PT LONG TERM GOAL  #10    TITLE Patient will be able to transfer from the floor to standing independently with use of LRAD   Baseline Patient needs min assist to transfer from the floor to standing   Time 12   Status On-going               Plan - 08/17/16 0952    Clinical Impression Statement Patient is progressing towards all goals and continues to benefit from skilled PT to improve posture during gait, and decrease her falls risk.  She has a score of 14/24 on her  DGI, and . 8 m/sec gait speed both outcome meausres indicating a fall risk. She has also gained some weight and this might be adding to her difficulty of improving her TUG score , walk speed time, 6 MW tes has decreased and it also is difficult for her to be able to get up from the ground or transfer int and out of a large car. She needs min asssit for stepping up on a curb.  She is recently having difficulty raising up her RLE to ascend steps safety, and has caught her foot causing her to stumble.  She is going to a college campus  and needs to be safe on ascending and descending a curb and performing car transfers. She needs CGA with car transfers and continues to have LE weakness in both hips and knees. She will benefit from skilled PT to improve ambulation skills on uneven surfaces and inclines, and continuing  to practice getting up from the ground level, and to progress her HEP.   Rehab Potential Good   Clinical Impairments Affecting Rehab Potential weakness and decreased standing balance   PT Frequency 1x / week   PT Duration 12 weeks   PT Treatment/Interventions Therapeutic exercise;Therapeutic activities;Gait training;Balance training;Stair training;DME Instruction;Neuromuscular re-education;Patient/family education   PT Next Visit Plan progress LE, core strengthening and balance   PT Home Exercise Plan Continued from previous sessions.       Patient will benefit from skilled therapeutic intervention  in order to improve the following deficits  and impairments:  Abnormal gait, Decreased balance, Decreased endurance, Difficulty walking, Decreased strength  Visit Diagnosis: Muscle weakness (generalized)  Lack of coordination  Difficulty in walking, not elsewhere classified     Problem List There are no active problems to display for this patient.   [name] 08/17/2016, 9:57 AM  [place] MAIN [facility] SERVICES [address] [place], [place], [address] Phone: [phone]   Fax:  [phone]  Name: Michelle Randall MRN: [id number] Date of Birth: Aug 23, 1996

## 2016-08-11 ENCOUNTER — Encounter: Payer: Self-pay | Admitting: Occupational Therapy

## 2016-08-11 ENCOUNTER — Ambulatory Visit: Payer: Medicaid Other | Admitting: Physical Therapy

## 2016-08-11 NOTE — Therapy (Signed)
[place] [facility] MAIN [facility] SERVICES [address] [place], [place], [address] Phone: [phone]   Fax:  [phone]  Occupational Therapy Treatment  Patient Details  Name: Michelle Randall MRN: [id number] Date of Birth: Nov 26, 1995 No Data Recorded  Encounter Date: 08/10/2016      OT End of Session - 08/11/16 1416    Visit Number 45   Number of Visits 70   Date for OT Re-Evaluation 10/05/16   Authorization Type medicaid visit 24 of 48   OT Start Time 1432   OT Stop Time 1515   OT Time Calculation (min) 43 min   Activity Tolerance Patient tolerated treatment well   Behavior During Therapy [facility] for tasks assessed/performed      History reviewed. No pertinent past medical history.  History reviewed. No pertinent surgical history.  There were no vitals filed for this visit.      Subjective Assessment - 08/11/16 1408    Subjective  Patient reports school is going well, tests are harder in college than they were in high school.  She feels she had to study a lot more to prepare.     Patient is accompained by: Family member   Patient Stated Goals wants to be independent with all tasks so she will be able to go away to college.   Multiple Pain Sites No                      OT Treatments/Exercises (OP) - 08/11/16 1409      Fine Motor Coordination   Other Fine Motor Exercises Patient seen for focus on fine motor coordination tasks with progression to really small beads placing on a paperclip with use of right and left hands.  Picking up from flat surface..     Neurological Re-education Exercises   Other Exercises 1 Patient seen for bilateral UE strengthening to assist with performing daily activities at home and school.  Patient performing grip strength on right and left hands for sustained grip 17.9# for 25 reps each side, 23.4# for 25 reps each side.  Patient performing resistive pinch skills with multiple reps with hardest level  of black resistance with progression from placing on small rung to large rung with cues for type of pinch and technique.                  OT Education - 08/11/16 1415    Education provided Yes   Education Details coordination with manipulation of small objects, HEP with resistive pinch   Person(s) Educated Patient   Methods Explanation;Demonstration;Verbal cues   Comprehension Verbal cues required;Returned demonstration;Verbalized understanding             OT Long Term Goals - 07/15/16 2103      OT LONG TERM GOAL #1   Title Patient will demonstrate methods of picking up her bookbag and placing it onto the back of her chair with modified independence to transport items to class.   Baseline requires assistance from others to complete now but will need to be independent when at college in the fall .   Time 6   Period Months   Status On-going     OT LONG TERM GOAL #2   Title Patient will be able to make her bed with modified independence.   Baseline she continues to have difficulty with balance and completing this task, moderate assist.   Time 6   Period Months     OT  LONG TERM GOAL #3   Title Patient will demonstrate the ability to perform vacuuming of one room with modified independence using light weight vacuum.   Baseline still requires max assist   Time 6   Period Months   Status On-going     OT LONG TERM GOAL #5   Title Patient will demonstrate donning and doffing her bra with modified independence.   Baseline max assist   Time 12   Period Weeks   Status Achieved     OT LONG TERM GOAL #6   Title Will now be able to go through a door that swings out using motorized wheel chair.   Baseline difficulty depending on type of door and set up of the area, at times requires max assistance depending on the weight of the door.   Time 6   Period Months   Status Achieved     OT LONG TERM GOAL  #9   Baseline Patient will be able to braid her own hair before college    Time 6   Period Months     OT LONG TERM GOAL  #10   TITLE Wll be able to wash her own hair before starting college   Baseline mom still has to help with more than 20% of task now down from 50%.     Time 6   Period Months   Status Achieved     OT LONG TERM GOAL  #11   TITLE Will be able to squeeze tooth paste out of the tube when it is less than half full.   Baseline still requires min assist if tube is less than 1/2 full.   Time 6   Period Months   Status On-going     OT LONG TERM GOAL  #12   TITLE Patient will demonstrate managing wheelchair rain poncho in inclement weather with modified independence.   Baseline crutch tips are difficult for anyone to take them off, goal deferred.   Time 6   Period Months   Status On-going     OT LONG TERM GOAL  #13   TITLE Patient will be able to complete 2 1/2 minutes straight with left shoulder flexion simulating driving with a stearing wheel ball to be able to drive to college.    Baseline can do 30 second of shoulder flexion with 2 1/2 before fatigue and can only drive 15 min before right shoulder fatigues.   Time 6   Period Months   Status On-going     OT LONG TERM GOAL  #14   TITLE Will be able to open taped boxes (for moving to college) using a key or knife.   Baseline ongoing techniques explored with adaptive devices to assist.   Time 6   Period Months   Status Deferred     OT LONG TERM GOAL  #15   TITLE Patient will be able to obtain food tray in the cafeteria from a wheelchair level and take to the table without spilling with modified independence.    Baseline max assist   Time 6   Period Months   Status Achieved               Plan - 08/11/16 1417    Clinical Impression Statement Patient is continuing to work towards improved strength in bilateral UEs to perform tasks such as carry and manage her bookbag, lifting her computer for school, perform tasks such as grooming at home and homemaking tasks.  She has  improved with her coordination for speed and dexterity resulting in improved handwriting but still is slower when taking notes at a college level.  Continue to work towards goals to improve all tasks at home, school and work.     Rehab Potential Good   OT Frequency 2x / week   OT Duration 12 weeks   OT Treatment/Interventions Self-care/ADL training;Therapeutic exercise;Therapeutic exercises;Therapeutic activities   Consulted and Agree with Plan of Care Patient   Family Member Consulted mom      Patient will benefit from skilled therapeutic intervention in order to improve the following deficits and impairments:  Impaired UE functional use, Difficulty walking, Decreased strength, Decreased coordination, Decreased endurance, Decreased balance  Visit Diagnosis: Muscle weakness (generalized)  Other lack of coordination    Problem List There are no active problems to display for this patient.  Kerrie Buffalomy T Lovett, OTR/L, CLT  Lovett,Amy 08/11/2016, 2:21 PM  [place] [facility] MAIN [facility] [address] [place], [place], [address] Phone: [phone]   Fax:  [phone]  Name: Michelle Randall MRN: [id number] Date of Birth: 10-13-1996

## 2016-08-17 ENCOUNTER — Ambulatory Visit: Payer: Medicaid Other | Attending: Pediatrics | Admitting: Occupational Therapy

## 2016-08-17 ENCOUNTER — Ambulatory Visit: Payer: Medicaid Other

## 2016-08-17 DIAGNOSIS — M6281 Muscle weakness (generalized): Secondary | ICD-10-CM | POA: Insufficient documentation

## 2016-08-17 DIAGNOSIS — R262 Difficulty in walking, not elsewhere classified: Secondary | ICD-10-CM | POA: Diagnosis present

## 2016-08-17 DIAGNOSIS — R278 Other lack of coordination: Secondary | ICD-10-CM | POA: Insufficient documentation

## 2016-08-17 DIAGNOSIS — R279 Unspecified lack of coordination: Secondary | ICD-10-CM | POA: Insufficient documentation

## 2016-08-17 NOTE — Addendum Note (Signed)
Addended by: Ezekiel InaMANSFIELD, Lizzete Gough S on: 08/17/2016 03:44 PM   Modules accepted: Orders

## 2016-08-17 NOTE — Addendum Note (Signed)
Addended by: Ezekiel InaMANSFIELD, Kaylanie Capili S on: 08/17/2016 03:35 PM   Modules accepted: Orders

## 2016-08-20 ENCOUNTER — Encounter: Payer: Self-pay | Admitting: Occupational Therapy

## 2016-08-20 NOTE — Therapy (Signed)
[place] [facility] MAIN [facility] SERVICES [address] [place], [place], [address] Phone: [phone]   Fax:  [phone]  Occupational Therapy Treatment  Patient Details  Name: Michelle Randall MRN: [id number] Date of Birth: [birth year] No Data Recorded  Encounter Date: 08/17/2016      OT End of Session - 08/20/16 1025    Visit Number 46   Number of Visits 70   Date for OT Re-Evaluation 10/05/16   Authorization Type medicaid visit 25 of 48   OT Start Time 1429   OT Stop Time 1515   OT Time Calculation (min) 46 min   Activity Tolerance Patient tolerated treatment well   Behavior During Therapy [facility] for tasks assessed/performed      History reviewed. No pertinent past medical history.  History reviewed. No pertinent surgical history.  There were no vitals filed for this visit.      Subjective Assessment - 08/20/16 1020    Subjective  Patient reports she still needs to work on picking up and manipulating small objects and using push pins on bulletin board.    Patient Stated Goals wants to be independent with all tasks so she will be able to go away to college.   Currently in Pain? No/denies   Pain Score 0-No pain                      OT Treatments/Exercises (OP) - 08/20/16 1020      ADLs   ADL Comments Patient seen for task of hanging papers onto bulletin board with bilateral UEs for reach and use of right hand to place push pins into board.  Performed with bulletin board on the table as well as on the wall.  Cues as needed.     Fine Motor Coordination   Other Fine Motor Exercises Patient seen for focus on fine motor coordination tasks with manipulation of small 1/2 inch objects from bowl and placing into specified slots in a horizontal plane.  Cues for prehension patterns and using the hand for storage along with translatory movements of the hand.      Neurological Re-education Exercises   Other Exercises 1 Patient seen this  date for finger strengthening tasks of placing push pins into bulletin board with resistance from the cork.  Cues for technique, multiple sets and reps performed with just pins and board prior to placing items on the board for school tasks                 OT Education - 08/20/16 1025    Education provided Yes   Education Details prehension patterns, coordination tasks.   Person(s) Educated Patient   Methods Explanation;Demonstration;Verbal cues   Comprehension Verbal cues required;Returned demonstration;Verbalized understanding             OT Long Term Goals - 07/15/16 2103      OT LONG TERM GOAL #1   Title Patient will demonstrate methods of picking up her bookbag and placing it onto the back of her chair with modified independence to transport items to class.   Baseline requires assistance from others to complete now but will need to be independent when at college in the fall .   Time 6   Period Months   Status On-going     OT LONG TERM GOAL #2   Title Patient will be able to make her bed with modified independence.   Baseline she continues to have difficulty with balance  and completing this task, moderate assist.   Time 6   Period Months     OT LONG TERM GOAL #3   Title Patient will demonstrate the ability to perform vacuuming of one room with modified independence using light weight vacuum.   Baseline still requires max assist   Time 6   Period Months   Status On-going     OT LONG TERM GOAL #5   Title Patient will demonstrate donning and doffing her bra with modified independence.   Baseline max assist   Time 12   Period Weeks   Status Achieved     OT LONG TERM GOAL #6   Title Will now be able to go through a door that swings out using motorized wheel chair.   Baseline difficulty depending on type of door and set up of the area, at times requires max assistance depending on the weight of the door.   Time 6   Period Months   Status Achieved     OT LONG  TERM GOAL  #9   Baseline Patient will be able to braid her own hair before college   Time 6   Period Months     OT LONG TERM GOAL  #10   TITLE Wll be able to wash her own hair before starting college   Baseline mom still has to help with more than 20% of task now down from 50%.     Time 6   Period Months   Status Achieved     OT LONG TERM GOAL  #11   TITLE Will be able to squeeze tooth paste out of the tube when it is less than half full.   Baseline still requires min assist if tube is less than 1/2 full.   Time 6   Period Months   Status On-going     OT LONG TERM GOAL  #12   TITLE Patient will demonstrate managing wheelchair rain poncho in inclement weather with modified independence.   Baseline crutch tips are difficult for anyone to take them off, goal deferred.   Time 6   Period Months   Status On-going     OT LONG TERM GOAL  #13   TITLE Patient will be able to complete 2 1/2 minutes straight with left shoulder flexion simulating driving with a stearing wheel ball to be able to drive to college.    Baseline can do 30 second of shoulder flexion with 2 1/2 before fatigue and can only drive 15 min before right shoulder fatigues.   Time 6   Period Months   Status On-going     OT LONG TERM GOAL  #14   TITLE Will be able to open taped boxes (for moving to college) using a key or knife.   Baseline ongoing techniques explored with adaptive devices to assist.   Time 6   Period Months   Status Deferred     OT LONG TERM GOAL  #15   TITLE Patient will be able to obtain food tray in the cafeteria from a wheelchair level and take to the table without spilling with modified independence.    Baseline max assist   Time 6   Period Months   Status Achieved               Plan - 08/20/16 1026    Clinical Impression Statement Patient continues to demo difficulty with coordination and strength to place items onto bulletin board for her school work.  Seen this date with task  analysis, broken down into components and then performed task in entirety in the context of function.   Patient continues to benefit from OT to focus on improving hand strength and skills needed for necessary daily tasks at home and college.    Rehab Potential Good   OT Frequency 2x / week   OT Duration 12 weeks   OT Treatment/Interventions Self-care/ADL training;Therapeutic exercise;Therapeutic exercises;Therapeutic activities   Consulted and Agree with Plan of Care Patient      Patient will benefit from skilled therapeutic intervention in order to improve the following deficits and impairments:  Impaired UE functional use, Difficulty walking, Decreased strength, Decreased coordination, Decreased endurance, Decreased balance  Visit Diagnosis: Muscle weakness (generalized)  Lack of coordination    Problem List There are no active problems to display for this patient.  Kerrie Buffalomy T Lovett, OTR/L, CLT  Lovett,Amy 08/20/2016, 10:29 AM  [facility] [address] [place], [place], [address] Phone: [phone]   Fax:  [phone]  Name: Julaine HuaMisbah N Kosa MRN: [id number] Date of Birth: May 24, 1996

## 2016-08-24 ENCOUNTER — Ambulatory Visit: Payer: Medicaid Other | Admitting: Occupational Therapy

## 2016-08-24 ENCOUNTER — Encounter: Payer: Self-pay | Admitting: Physical Therapy

## 2016-08-24 ENCOUNTER — Ambulatory Visit: Payer: Medicaid Other | Admitting: Physical Therapy

## 2016-08-24 DIAGNOSIS — R262 Difficulty in walking, not elsewhere classified: Secondary | ICD-10-CM

## 2016-08-24 DIAGNOSIS — M6281 Muscle weakness (generalized): Secondary | ICD-10-CM

## 2016-08-24 DIAGNOSIS — R279 Unspecified lack of coordination: Secondary | ICD-10-CM

## 2016-08-24 NOTE — Therapy (Signed)
[place] [facility] MAIN [facility] SERVICES [address] [place], [place], [address] Phone: [phone]   Fax:  [phone]  Physical Therapy Treatment  Patient Details  Name: Michelle Randall MRN: [id number] Date of Birth: [birth year] No Data Recorded  Encounter Date: 08/24/2016      PT End of Session - 08/24/16 1504    Visit Number 56   Date for PT Re-Evaluation 08/09/16   Authorization Type medicaid   PT Start Time 1515   PT Stop Time 1600   PT Time Calculation (min) 45 min   Equipment Utilized During Treatment Gait belt   Activity Tolerance Patient tolerated treatment well   Behavior During Therapy [facility] for tasks assessed/performed      History reviewed. No pertinent past medical history.  History reviewed. No pertinent surgical history.  There were no vitals filed for this visit.      Subjective Assessment - 08/24/16 1517    Subjective No complaints or concerns today.  Pt reports she has not been doing her HEP as frequently with school but has been walking farther distances around campus.  She continues to report that she has most difficulty with ascending steps or stepping up on curb.    Patient is accompained by: Family member   Limitations Walking   Patient Stated Goals Patient wants to improve her core strength.    Currently in Pain? No/denies   Pain Onset --       TREATMENT   Neuromuscular Rehab:  Sideways walking on airex x4 lengths in // bars  Tandem walking on airex x4 lengths in // bars  Alternating toe taps up to 8" step from airex, 3x10, cues for R knee flexion as pt demonstrates R hip hike>L   Therapeutic Exercise:  Stair training with R rail and cues for glute activation to decrease use of RUE. Practiced on 4 step stair x4  Bil cross body reaching to cones while sitting on airex x4 minutes  Seated manually resisted Bil knee flexion/extension, 2x10  Forward lunge onto bosu ball 2x10 each side. Cues for upright  posture.                 PT Education - 08/24/16 1504    Education provided Yes   Education Details Exercise technique   Person(s) Educated Patient   Methods Explanation;Demonstration;Verbal cues   Comprehension Verbalized understanding;Returned demonstration             PT Long Term Goals - 08/17/16 0942      PT LONG TERM GOAL #1   Title Patient will improve Dynamic Gait Index (DGI) score to > 19/24 for low falls risk regarding dynamic walking tasks    Baseline DGI 14/24   Time 12   Status On-going     PT LONG TERM GOAL #2   Title Patient will increase Berg Balance score by > 6 points to demonstrate decreased fall risk during functional activities    Time 12   Status On-going     PT LONG TERM GOAL #3   Title Patient will be able to transfer in and out of a large car with Patient will be able to transfer in and out of a high seated car with CGA    Baseline Patient requires min for transfer into large car   Time 12   Status On-going     PT LONG TERM GOAL #4   Title Patient will complete a TUG test in < 12 seconds for independent  mobility and decreased fall risk    Baseline 11.20 sec   Time 12   Period Weeks   Status On-going     PT LONG TERM GOAL #5   Title Patient will improve 6 minute walk distance by > 150 ft for improved return to functional community activities    Baseline 640 feet   Time 12   Status On-going     PT LONG TERM GOAL #6   Title Patient will improve gait speed to > 1.2 m/s with least restrictive assistive device to return to normal walking speed    Baseline .80 m/sec   Time 12   Period Weeks   Status On-going     PT LONG TERM GOAL #7   Title Patient (< 19 years old) will complete five times sit to stand test in < 10 seconds indicating an increased LE strength and improved balance    Time 10.56   Status On-going     PT LONG TERM GOAL #8   Title Patient will be able to ambulate on inclines and grass independenlty with LRAD    Baseline Patient needs SBA for ambulation in grass or ueven surfaces   Time 12   Period Weeks   Status On-going     PT LONG TERM GOAL  #9   TITLE Patient will be abe to transfer from low chair or stool wihtout UE support independently   Baseline Patient needs min asssit to transfer from a low stool   Time 12   Status On-going     PT LONG TERM GOAL  #10   TITLE Patient will be able to transfer from the floor to standing independently with use of LRAD   Baseline Patient needs min assist to transfer from the floor to standing   Time 12   Status On-going               Plan - 08/24/16 1604    Clinical Impression Statement Pt demonstrated significant weakness during manually resisted seated knee flexion/extension and will benefit from strengthening exercises focesed on these muscle groups.  Cues provided for glute activation with stair training, improving mechanics and decreasing R hip hike during ascent.  She tolerated all interventions well this date.   Rehab Potential Good   Clinical Impairments Affecting Rehab Potential weakness and decreased standing balance   PT Frequency 1x / week   PT Duration 12 weeks   PT Treatment/Interventions Therapeutic exercise;Therapeutic activities;Gait training;Balance training;Stair training;DME Instruction;Neuromuscular re-education;Patient/family education   PT Next Visit Plan Bil knee flexion/extension strengthening; Progress LE, core strengthening and balance   PT Home Exercise Plan Continued from previous sessions.       Patient will benefit from skilled therapeutic intervention in order to improve the following deficits and impairments:  Abnormal gait, Decreased balance, Decreased endurance, Difficulty walking, Decreased strength  Visit Diagnosis: Muscle weakness (generalized)  Lack of coordination  Difficulty in walking, not elsewhere classified     Problem List There are no active problems to display for this  patient.   Encarnacion ChuAshley Maryclaire Stoecker PT, DPT 08/24/2016, 4:08 PM  [place] [facility] [address] [place], [place], [address] Phone: [phone]   Fax:  [phone]  Name: Michelle Randall MRN: [id number] Date of Birth: October 07, 1996

## 2016-08-27 ENCOUNTER — Encounter: Payer: Self-pay | Admitting: Occupational Therapy

## 2016-08-27 NOTE — Therapy (Signed)
[place] [facility] MAIN [facility] SERVICES [address] [place], [place], [address] Phone: [phone]   Fax:  [phone]  Occupational Therapy Treatment  Patient Details  Name: Michelle Randall MRN: [id number] Date of Birth: July 05, 1996 No Data Recorded  Encounter Date: 08/24/2016      OT End of Session - 08/27/16 0939    Visit Number 47   Number of Visits 70   Date for OT Re-Evaluation 10/05/16   Authorization Type medicaid visit 26 of 48   OT Start Time 1431   OT Stop Time 1515   OT Time Calculation (min) 44 min   Activity Tolerance Patient tolerated treatment well   Behavior During Therapy [facility] for tasks assessed/performed      History reviewed. No pertinent past medical history.  History reviewed. No pertinent surgical history.  There were no vitals filed for this visit.      Subjective Assessment - 08/27/16 0930    Subjective  Patient reports school is going well, she is getting used to the routine more and is not having any trouble with accessing her classes.    Patient Stated Goals wants to be independent with all tasks so she will be able to go away to college.   Currently in Pain? No/denies   Pain Score 0-No pain                      OT Treatments/Exercises (OP) - 08/27/16 0931      Fine Motor Coordination   Other Fine Motor Exercises Patient seen this date for focus on fine motor coordination tasks with manipulation of small beads, 1/4 inch and smaller with bilateral UEs.  Cues for prehension patterns and technique picking up from different surfaces, flat, rounded bowl, etc.  Manipulation of small sticks with oppositional movements of the hand alternating between thumb and index and thumb and middle finger.       Neurological Re-education Exercises   Other Exercises 1 Patient seen for BUE strengthening exercises with red resistive theraband for shoulder flexion, ABD/ADD, diagonal patterns, elbow flexion/extension.   2 sets of 12 reps each exercise.  Balloon volley with and without 1# weight for multiple sets, hitting in a variety of planes from seated position.                OT Education - 08/27/16 [phone]    Education provided Yes   Education Details strengthening with red theraband   Person(s) Educated Patient   Methods Explanation;Demonstration;Verbal cues   Comprehension Verbal cues required;Returned demonstration;Verbalized understanding             OT Long Term Goals - 07/15/16 2103      OT LONG TERM GOAL #1   Title Patient will demonstrate methods of picking up her bookbag and placing it onto the back of her chair with modified independence to transport items to class.   Baseline requires assistance from others to complete now but will need to be independent when at college in the fall .   Time 6   Period Months   Status On-going     OT LONG TERM GOAL #2   Title Patient will be able to make her bed with modified independence.   Baseline she continues to have difficulty with balance and completing this task, moderate assist.   Time 6   Period Months     OT LONG TERM GOAL #3   Title Patient will demonstrate the ability to perform  vacuuming of one room with modified independence using light weight vacuum.   Baseline still requires max assist   Time 6   Period Months   Status On-going     OT LONG TERM GOAL #5   Title Patient will demonstrate donning and doffing her bra with modified independence.   Baseline max assist   Time 12   Period Weeks   Status Achieved     OT LONG TERM GOAL #6   Title Will now be able to go through a door that swings out using motorized wheel chair.   Baseline difficulty depending on type of door and set up of the area, at times requires max assistance depending on the weight of the door.   Time 6   Period Months   Status Achieved     OT LONG TERM GOAL  #9   Baseline Patient will be able to braid her own hair before college   Time 6    Period Months     OT LONG TERM GOAL  #10   TITLE Wll be able to wash her own hair before starting college   Baseline mom still has to help with more than 20% of task now down from 50%.     Time 6   Period Months   Status Achieved     OT LONG TERM GOAL  #11   TITLE Will be able to squeeze tooth paste out of the tube when it is less than half full.   Baseline still requires min assist if tube is less than 1/2 full.   Time 6   Period Months   Status On-going     OT LONG TERM GOAL  #12   TITLE Patient will demonstrate managing wheelchair rain poncho in inclement weather with modified independence.   Baseline crutch tips are difficult for anyone to take them off, goal deferred.   Time 6   Period Months   Status On-going     OT LONG TERM GOAL  #13   TITLE Patient will be able to complete 2 1/2 minutes straight with left shoulder flexion simulating driving with a stearing wheel ball to be able to drive to college.    Baseline can do 30 second of shoulder flexion with 2 1/2 before fatigue and can only drive 15 min before right shoulder fatigues.   Time 6   Period Months   Status On-going     OT LONG TERM GOAL  #14   TITLE Will be able to open taped boxes (for moving to college) using a key or knife.   Baseline ongoing techniques explored with adaptive devices to assist.   Time 6   Period Months   Status Deferred     OT LONG TERM GOAL  #15   TITLE Patient will be able to obtain food tray in the cafeteria from a wheelchair level and take to the table without spilling with modified independence.    Baseline max assist   Time 6   Period Months   Status Achieved               Plan - 08/27/16 0939    Clinical Impression Statement Patient is doing well at college, still has some difficulty with strength managing computer and bookbag at times, handwriting for brief note taking and coordination skills for select self care tasks.  She continues to benefit from skilled OT to  maximize her safety and independence at home and in college.  Continue  to work towards goals,    Rehab Potential Good   OT Frequency 2x / week   OT Duration 12 weeks   OT Treatment/Interventions Self-care/ADL training;Therapeutic exercise;Therapeutic exercises;Therapeutic activities   Consulted and Agree with Plan of Care Patient      Patient will benefit from skilled therapeutic intervention in order to improve the following deficits and impairments:  Impaired UE functional use, Difficulty walking, Decreased strength, Decreased coordination, Decreased endurance, Decreased balance  Visit Diagnosis: Muscle weakness (generalized)  Lack of coordination    Problem List There are no active problems to display for this patient.  Kerrie Buffalomy T Lovett, OTR/L, CLT  Lovett,Amy 08/27/2016, 9:42 AM  [place] [facility] [address] , [place], [address] Phone: [phone]   Fax:  [phone]  Name: Michelle Randall MRN: [id number] Date of Birth: 1996/04/19

## 2016-08-31 ENCOUNTER — Encounter: Payer: Self-pay | Admitting: Physical Therapy

## 2016-08-31 ENCOUNTER — Ambulatory Visit: Payer: Medicaid Other | Admitting: Physical Therapy

## 2016-08-31 ENCOUNTER — Ambulatory Visit: Payer: Medicaid Other | Admitting: Occupational Therapy

## 2016-08-31 DIAGNOSIS — R278 Other lack of coordination: Secondary | ICD-10-CM

## 2016-08-31 DIAGNOSIS — M6281 Muscle weakness (generalized): Secondary | ICD-10-CM

## 2016-08-31 DIAGNOSIS — R262 Difficulty in walking, not elsewhere classified: Secondary | ICD-10-CM

## 2016-08-31 NOTE — Patient Instructions (Signed)
OT TREATMENT    Neuro muscular re-education:  Pt. performed [facility] tasks using the Grooved pegboard. Pt. worked on grasping the grooved pegs from a horizontal position, and moving the pegs to a vertical position in the hand to prepare for placing them in the grooved slot. Pt. Worked on grasping 1/4" pegs using long nosed tweezers, and placing them in a pattern. Pt. Worked on placing 2" pegs in a pegboard placed on a vertical angle, and removing them with with tweezers while placed on a vertical angle.

## 2016-08-31 NOTE — Therapy (Signed)
[place] [employer] MAIN [facility] SERVICES [address] [place], [place], [address] Phone: [phone]   Fax:  [phone]  Physical Therapy Treatment  Patient Details  Name: Michelle Randall MRN: [id number] Date of Birth: [birth year] No Data Recorded  Encounter Date: 08/31/2016      PT End of Session - 08/31/16 1559    Visit Number 57   Date for PT Re-Evaluation 08/09/16   Authorization Type medicaid   PT Start Time 0320   PT Stop Time 0400   PT Time Calculation (min) 40 min   Equipment Utilized During Treatment Gait belt   Activity Tolerance Patient tolerated treatment well   Behavior During Therapy [facility] for tasks assessed/performed      History reviewed. No pertinent past medical history.  History reviewed. No pertinent surgical history.  There were no vitals filed for this visit.      Subjective Assessment - 08/31/16 1559    Subjective No complaints or concerns today.  Pt reports she has not been doing her HEP as frequently with school but has been walking farther distances around campus.  She continues to report that she has most difficulty with ascending steps or stepping up on curb.    Patient is accompained by: Family member   Limitations Walking   Patient Stated Goals Patient wants to improve her core strength.    Currently in Pain? No/denies   Pain Score 0-No pain   Pain Onset In the past 7 days    Therapeutic exercise: leg press  100 lbs x 20 x 3 Planks x 1 minute bridging, side lying, 1 minute Quadriped alternating UE with 5 sec hold TM elevation x 2 and 10 minutes at 1.0 m/hour CGA and Min to mod verbal cues used throughout with increased in postural sway and LOB most seen with narrow base of support and while on uneven surfaces. Continues to have balance deficits typical with diagnosis. Patient performs intermediate level exercises without pain behaviors and needs verbal cuing for postural alignment and head positioning Tactile  cues and assistance needed to keep lower leg and knee in neutral to avoid compensations with ankle motions.                             PT Education - 08/31/16 1559    Education provided No   Person(s) Educated Patient   Methods Explanation   Comprehension Verbalized understanding             PT Long Term Goals - 08/17/16 0942      PT LONG TERM GOAL #1   Title Patient will improve Dynamic Gait Index (DGI) score to > 19/24 for low falls risk regarding dynamic walking tasks    Baseline DGI 14/24   Time 12   Status On-going     PT LONG TERM GOAL #2   Title Patient will increase Berg Balance score by > 6 points to demonstrate decreased fall risk during functional activities    Time 12   Status On-going     PT LONG TERM GOAL #3   Title Patient will be able to transfer in and out of a large car with Patient will be able to transfer in and out of a high seated car with CGA    Baseline Patient requires min for transfer into large car   Time 12   Status On-going     PT LONG TERM GOAL #4  Title Patient will complete a TUG test in < 12 seconds for independent mobility and decreased fall risk    Baseline 11.20 sec   Time 12   Period Weeks   Status On-going     PT LONG TERM GOAL #5   Title Patient will improve 6 minute walk distance by > 150 ft for improved return to functional community activities    Baseline 640 feet   Time 12   Status On-going     PT LONG TERM GOAL #6   Title Patient will improve gait speed to > 1.2 m/s with least restrictive assistive device to return to normal walking speed    Baseline .80 m/sec   Time 12   Period Weeks   Status On-going     PT LONG TERM GOAL #7   Title Patient (< 19 years old) will complete five times sit to stand test in < 10 seconds indicating an increased LE strength and improved balance    Time 10.56   Status On-going     PT LONG TERM GOAL #8   Title Patient will be able to ambulate on inclines and  grass independenlty with LRAD   Baseline Patient needs SBA for ambulation in grass or ueven surfaces   Time 12   Period Weeks   Status On-going     PT LONG TERM GOAL  #9   TITLE Patient will be abe to transfer from low chair or stool wihtout UE support independently   Baseline Patient needs min asssit to transfer from a low stool   Time 12   Status On-going     PT LONG TERM GOAL  #10   TITLE Patient will be able to transfer from the floor to standing independently with use of LRAD   Baseline Patient needs min assist to transfer from the floor to standing   Time 12   Status On-going               Plan - 08/31/16 1601    Rehab Potential Good   Clinical Impairments Affecting Rehab Potential weakness and decreased standing balance   PT Frequency 1x / week   PT Duration 12 weeks   PT Treatment/Interventions Therapeutic exercise;Therapeutic activities;Gait training;Balance training;Stair training;DME Instruction;Neuromuscular re-education;Patient/family education   PT Next Visit Plan Bil knee flexion/extension strengthening; Progress LE, core strengthening and balance   PT Home Exercise Plan Continued from previous sessions.       Patient will benefit from skilled therapeutic intervention in order to improve the following deficits and impairments:  Abnormal gait, Decreased balance, Decreased endurance, Difficulty walking, Decreased strength  Visit Diagnosis: Muscle weakness (generalized)  Difficulty in walking, not elsewhere classified     Problem List There are no active problems to display for this patient.   Ezekiel InaMansfield, Cherl Gorney S 08/31/2016, 4:51 PM  [place] [facility] [address] [place], [place], [address] Phone: [phone]   Fax:  [phone]  Name: Michelle Randall MRN: [id number] Date of Birth: 1996/10/06

## 2016-08-31 NOTE — Therapy (Signed)
[place] [facility] MAIN [facility] SERVICES [address] [place], [place], [address] Phone: [phone]   Fax:  [phone]  Occupational Therapy Treatment  Patient Details  Name: Michelle Randall MRN: [id number] Date of Birth: 1995-12-18 No Data Recorded  Encounter Date: 08/31/2016      OT End of Session - 08/31/16 1458    Visit Number 48   Number of Visits 70   Date for OT Re-Evaluation 10/05/16   Authorization Type medicaid visit 27 of 48   OT Start Time 1432   OT Stop Time 1510   OT Time Calculation (min) 38 min   Equipment Utilized During Treatment lemon press   Activity Tolerance Patient tolerated treatment well   Behavior During Therapy WFL for tasks assessed/performed      No past medical history on file.  No past surgical history on file.  There were no vitals filed for this visit.      Subjective Assessment - 08/31/16 1439    Subjective  Pt. reports being on Fall break.   Patient is accompained by: Family member         OT TREATMENT    Neuro muscular re-education:  Pt. performed [facility] tasks using the Grooved pegboard. Pt. worked on grasping the grooved pegs from a horizontal position, and moving the pegs to a vertical position in the hand to prepare for placing them in the grooved slot. Pt. Worked on grasping 1/4" pegs using long nosed tweezers, and placing them in a pattern. Pt. Worked on placing 2" pegs in a pegboard placed on a vertical angle, and removing them with with tweezers while placed on a vertical angle.                         OT Education - 08/31/16 1444    Education provided Yes   Education Details Pt. education was provided about [facility] skills.   Person(s) Educated Patient   Methods Explanation;Demonstration   Comprehension Verbalized understanding;Returned demonstration             OT Long Term Goals - 07/15/16 2103      OT LONG TERM GOAL #1   Title Patient will demonstrate  methods of picking up her bookbag and placing it onto the back of her chair with modified independence to transport items to class.   Baseline requires assistance from others to complete now but will need to be independent when at college in the fall .   Time 6   Period Months   Status On-going     OT LONG TERM GOAL #2   Title Patient will be able to make her bed with modified independence.   Baseline she continues to have difficulty with balance and completing this task, moderate assist.   Time 6   Period Months     OT LONG TERM GOAL #3   Title Patient will demonstrate the ability to perform vacuuming of one room with modified independence using light weight vacuum.   Baseline still requires max assist   Time 6   Period Months   Status On-going     OT LONG TERM GOAL #5   Title Patient will demonstrate donning and doffing her bra with modified independence.   Baseline max assist   Time 12   Period Weeks   Status Achieved     OT LONG TERM GOAL #6   Title Will now be able to go through a door that  swings out using motorized wheel chair.   Baseline difficulty depending on type of door and set up of the area, at times requires max assistance depending on the weight of the door.   Time 6   Period Months   Status Achieved     OT LONG TERM GOAL  #9   Baseline Patient will be able to braid her own hair before college   Time 6   Period Months     OT LONG TERM GOAL  #10   TITLE Wll be able to wash her own hair before starting college   Baseline mom still has to help with more than 20% of task now down from 50%.     Time 6   Period Months   Status Achieved     OT LONG TERM GOAL  #11   TITLE Will be able to squeeze tooth paste out of the tube when it is less than half full.   Baseline still requires min assist if tube is less than 1/2 full.   Time 6   Period Months   Status On-going     OT LONG TERM GOAL  #12   TITLE Patient will demonstrate managing wheelchair rain poncho in  inclement weather with modified independence.   Baseline crutch tips are difficult for anyone to take them off, goal deferred.   Time 6   Period Months   Status On-going     OT LONG TERM GOAL  #13   TITLE Patient will be able to complete 2 1/2 minutes straight with left shoulder flexion simulating driving with a stearing wheel ball to be able to drive to college.    Baseline can do 30 second of shoulder flexion with 2 1/2 before fatigue and can only drive 15 min before right shoulder fatigues.   Time 6   Period Months   Status On-going     OT LONG TERM GOAL  #14   TITLE Will be able to open taped boxes (for moving to college) using a key or knife.   Baseline ongoing techniques explored with adaptive devices to assist.   Time 6   Period Months   Status Deferred     OT LONG TERM GOAL  #15   TITLE Patient will be able to obtain food tray in the cafeteria from a wheelchair level and take to the table without spilling with modified independence.    Baseline max assist   Time 6   Period Months   Status Achieved               Plan - 08/31/16 1513    Clinical Impression Statement Pt. continues to have difficulty managing a a backpack with school related supplies, handwriting for note taking, and [facility] skills. Pt. could benefit from continued skilled OT services to work on improving IADLs, and school related tasks.    Rehab Potential Good   OT Frequency 2x / week   OT Duration 12 weeks   OT Treatment/Interventions Self-care/ADL training;Therapeutic exercise;Therapeutic exercises;Therapeutic activities   OT Home Exercise Plan try elastic shoe laces and new tech for bra donning and doffing   Consulted and Agree with Plan of Care Patient   Family Member Consulted mom      Patient will benefit from skilled therapeutic intervention in order to improve the following deficits and impairments:  Impaired UE functional use, Difficulty walking, Decreased strength, Decreased coordination,  Decreased endurance, Decreased balance  Visit Diagnosis: Muscle weakness (generalized)  Other lack  of coordination    Problem List There are no active problems to display for this patient.   Olegario Messier, MS, OTR/L 08/31/2016, 3:19 PM  [facility] MAIN [facility] SERVICES [address] [place], [place], [address] Phone: [phone]   Fax:  [phone]  Name: Michelle Randall MRN: [id number] Date of Birth: [birth year]

## 2016-09-07 ENCOUNTER — Ambulatory Visit: Payer: Medicaid Other | Admitting: Occupational Therapy

## 2016-09-07 ENCOUNTER — Encounter: Payer: Self-pay | Admitting: Physical Therapy

## 2016-09-07 ENCOUNTER — Ambulatory Visit: Payer: Medicaid Other | Admitting: Physical Therapy

## 2016-09-07 DIAGNOSIS — M6281 Muscle weakness (generalized): Secondary | ICD-10-CM

## 2016-09-07 DIAGNOSIS — R278 Other lack of coordination: Secondary | ICD-10-CM

## 2016-09-07 DIAGNOSIS — R262 Difficulty in walking, not elsewhere classified: Secondary | ICD-10-CM

## 2016-09-07 NOTE — Patient Instructions (Signed)
OT TREATMENT    Neuro muscular re-education:  Pt. worked on improving [facility] skills, and texterity with her right hand. Pt. Was able to hold and use long nosed tweezers to place 1/4" pegs onto a board. Pt. Was able to grasp, and turn the pegs without tipping the pegs around them over. Pt. Worked on these task in preparation for working on detailed ADL/IADL tasks. Pt. Worked on manipulating an extra small screw driver to replace screws on eyeglasses.

## 2016-09-07 NOTE — Therapy (Signed)
[place] [facility] [address] [place], [place], [address] Phone: [phone]   Fax:  [phone]  Physical Therapy Treatment  Patient Details  Name: Michelle HuaMisbah N Randall MRN: [id number] Date of Birth: 05/12/1996 No Data Recorded  Encounter Date: 09/07/2016      PT End of Session - 09/07/16 1523    Visit Number 58   Date for PT Re-Evaluation 08/09/16   Authorization Type medicaid   PT Start Time 0320   PT Stop Time 0400   PT Time Calculation (min) 40 min   Equipment Utilized During Treatment Gait belt   Activity Tolerance Patient tolerated treatment well   Behavior During Therapy [facility] for tasks assessed/performed      History reviewed. No pertinent past medical history.  History reviewed. No pertinent surgical history.  There were no vitals filed for this visit.      Subjective Assessment - 09/07/16 1522    Subjective No complaints or concerns today.  Pt reports she has not been doing her HEP as frequently with school but has been walking farther distances around campus.  She continues to report that she has most difficulty with ascending steps or stepping up on curb.    Patient is accompained by: Family member   Limitations Walking   Patient Stated Goals Patient wants to improve her core strength.    Currently in Pain? No/denies   Pain Score 0-No pain   Pain Onset In the past 7 days     Therapeutic exercise:  TM walking at 1. 0 miles / hourrt Posture training in standing in parallel bars with scapula retraction and RTB x 20 x 2 Prone rows with 3 lbs x 20 x 2 Prone mid trap wit 2 lbs Prone knee flex to stretch BLE hips x 30 sec x 3 Seated scapula retraction with GTB and 20 x 2   CGA and Min to mod verbal cues used throughout with posture training .                         PT Education - 09/07/16 1523    Education provided Yes   Education Details HEP   Person(s) Educated Patient   Methods  Explanation   Comprehension Verbalized understanding             PT Long Term Goals - 08/17/16 0942      PT LONG TERM GOAL #1   Title Patient will improve Dynamic Gait Index (DGI) score to > 19/24 for low falls risk regarding dynamic walking tasks    Baseline DGI 14/24   Time 12   Status On-going     PT LONG TERM GOAL #2   Title Patient will increase Berg Balance score by > 6 points to demonstrate decreased fall risk during functional activities    Time 12   Status On-going     PT LONG TERM GOAL #3   Title Patient will be able to transfer in and out of a large car with Patient will be able to transfer in and out of a high seated car with CGA    Baseline Patient requires min for transfer into large car   Time 12   Status On-going     PT LONG TERM GOAL #4   Title Patient will complete a TUG test in < 12 seconds for independent mobility and decreased fall risk    Baseline 11.20 sec   Time 12  Period Weeks   Status On-going     PT LONG TERM GOAL #5   Title Patient will improve 6 minute walk distance by > 150 ft for improved return to functional community activities    Baseline 640 feet   Time 12   Status On-going     PT LONG TERM GOAL #6   Title Patient will improve gait speed to > 1.2 m/s with least restrictive assistive device to return to normal walking speed    Baseline .80 m/sec   Time 12   Period Weeks   Status On-going     PT LONG TERM GOAL #7   Title Patient (< 36 years old) will complete five times sit to stand test in < 10 seconds indicating an increased LE strength and improved balance    Time 10.56   Status On-going     PT LONG TERM GOAL #8   Title Patient will be able to ambulate on inclines and grass independenlty with LRAD   Baseline Patient needs SBA for ambulation in grass or ueven surfaces   Time 12   Period Weeks   Status On-going     PT LONG TERM GOAL  #9   TITLE Patient will be abe to transfer from low chair or stool wihtout UE support  independently   Baseline Patient needs min asssit to transfer from a low stool   Time 12   Status On-going     PT LONG TERM GOAL  #10   TITLE Patient will be able to transfer from the floor to standing independently with use of LRAD   Baseline Patient needs min assist to transfer from the floor to standing   Time 12   Status On-going               Plan - 09/07/16 1523    Clinical Impression Statement Patient has weakness in BLE and decreased gait speed and difficulty with ascending and descending curbs and decreased standing posture. She will benefit from skilled PT.    Rehab Potential Good   Clinical Impairments Affecting Rehab Potential weakness and decreased standing balance   PT Frequency 1x / week   PT Duration 12 weeks   PT Treatment/Interventions Therapeutic exercise;Therapeutic activities;Gait training;Balance training;Stair training;DME Instruction;Neuromuscular re-education;Patient/family education   PT Next Visit Plan Bil knee flexion/extension strengthening; Progress LE, core strengthening and balance   PT Home Exercise Plan Continued from previous sessions.       Patient will benefit from skilled therapeutic intervention in order to improve the following deficits and impairments:  Abnormal gait, Decreased balance, Decreased endurance, Difficulty walking, Decreased strength  Visit Diagnosis: Muscle weakness (generalized)  Other lack of coordination  Difficulty in walking, not elsewhere classified     Problem List There are no active problems to display for this patient.   [name] 09/07/2016, 3:28 PM  [place] [facility] MAIN [facility] SERVICES [address] [place], [place], [address] Phone: [phone]   Fax:  [phone]  Name: Michelle Randall MRN: [id number] Date of Birth: 06-Oct-1996

## 2016-09-07 NOTE — Therapy (Signed)
[place] [facility] MAIN [facility] SERVICES [address] [place], [place], [address] Phone: [phone]   Fax:  [phone]  Occupational Therapy Treatment  Patient Details  Name: Michelle Randall MRN: [id number] Date of Birth: 04-23-1996 No Data Recorded  Encounter Date: 09/07/2016      OT End of Session - 09/07/16 1449    Visit Number 49   Number of Visits 70   Date for OT Re-Evaluation 10/05/16   Authorization Type medicaid visit 28 of 48   OT Start Time 1440   OT Stop Time 1515   OT Time Calculation (min) 35 min   Activity Tolerance Patient tolerated treatment well   Behavior During Therapy [facility] for tasks assessed/performed      No past medical history on file.  No past surgical history on file.  There were no vitals filed for this visit.      Subjective Assessment - 09/07/16 1446    Subjective  Pt. reports needing to have her wisdom teeth removed the weekend before Thanksgiving.   Patient is accompained by: Family member   Currently in Pain? No/denies   Pain Score 0-No pain   Pain Location Shoulder      OT TREATMENT    Neuro muscular re-education:  Pt. worked on improving [facility] skills, and texterity with her right hand. Pt. was able to hold and use long nosed tweezers to place 1/4" pegs onto a board. Pt. Was able to grasp, and turn the pegs without tipping the pegs around them over. Pt. Worked on these task in preparation for working on detailed ADL/IADL tasks. Pt. Worked on manipulating an extra small screw driver to replace screws on eyeglasses.                           OT Education - 09/07/16 1504    Education provided Yes   Education Details Pt. education was provided about [facility] skills.   Person(s) Educated Patient   Methods Explanation   Comprehension Verbalized understanding             OT Long Term Goals - 07/15/16 2103      OT LONG TERM GOAL #1   Title Patient will demonstrate methods of  picking up her bookbag and placing it onto the back of her chair with modified independence to transport items to class.   Baseline requires assistance from others to complete now but will need to be independent when at college in the fall .   Time 6   Period Months   Status On-going     OT LONG TERM GOAL #2   Title Patient will be able to make her bed with modified independence.   Baseline she continues to have difficulty with balance and completing this task, moderate assist.   Time 6   Period Months     OT LONG TERM GOAL #3   Title Patient will demonstrate the ability to perform vacuuming of one room with modified independence using light weight vacuum.   Baseline still requires max assist   Time 6   Period Months   Status On-going     OT LONG TERM GOAL #5   Title Patient will demonstrate donning and doffing her bra with modified independence.   Baseline max assist   Time 12   Period Weeks   Status Achieved     OT LONG TERM GOAL #6   Title Will now be able to  go through a door that swings out using motorized wheel chair.   Baseline difficulty depending on type of door and set up of the area, at times requires max assistance depending on the weight of the door.   Time 6   Period Months   Status Achieved     OT LONG TERM GOAL  #9   Baseline Patient will be able to braid her own hair before college   Time 6   Period Months     OT LONG TERM GOAL  #10   TITLE Wll be able to wash her own hair before starting college   Baseline mom still has to help with more than 20% of task now down from 50%.     Time 6   Period Months   Status Achieved     OT LONG TERM GOAL  #11   TITLE Will be able to squeeze tooth paste out of the tube when it is less than half full.   Baseline still requires min assist if tube is less than 1/2 full.   Time 6   Period Months   Status On-going     OT LONG TERM GOAL  #12   TITLE Patient will demonstrate managing wheelchair rain poncho in inclement  weather with modified independence.   Baseline crutch tips are difficult for anyone to take them off, goal deferred.   Time 6   Period Months   Status On-going     OT LONG TERM GOAL  #13   TITLE Patient will be able to complete 2 1/2 minutes straight with left shoulder flexion simulating driving with a stearing wheel ball to be able to drive to college.    Baseline can do 30 second of shoulder flexion with 2 1/2 before fatigue and can only drive 15 min before right shoulder fatigues.   Time 6   Period Months   Status On-going     OT LONG TERM GOAL  #14   TITLE Will be able to open taped boxes (for moving to college) using a key or knife.   Baseline ongoing techniques explored with adaptive devices to assist.   Time 6   Period Months   Status Deferred     OT LONG TERM GOAL  #15   TITLE Patient will be able to obtain food tray in the cafeteria from a wheelchair level and take to the table without spilling with modified independence.    Baseline max assist   Time 6   Period Months   Status Achieved               Plan - 09/07/16 1505    Clinical Impression Statement Pt. continues to work on [facility] skills to work on detailed work during ADLs, and IADLs. Pt. continues to benefit from skilled OT services to work on [facility] skilled needed for efficient handwriting for college related notetaking, and school related tasks.   Rehab Potential Good   OT Frequency 2x / week   OT Duration 12 weeks   OT Treatment/Interventions Self-care/ADL training;Therapeutic exercise;Therapeutic exercises;Therapeutic activities   Consulted and Agree with Plan of Care Patient   Family Member Consulted mom      Patient will benefit from skilled therapeutic intervention in order to improve the following deficits and impairments:  Impaired UE functional use, Difficulty walking, Decreased strength, Decreased coordination, Decreased endurance, Decreased balance  Visit Diagnosis: Muscle weakness  (generalized)    Problem List There are no active problems to display for  this patient.   Olegario MessierElaine Miyana Mordecai, MS, OTR/L 09/07/2016, 3:20 PM  [place] [facility] [address] [place], [place], [address] Phone: [phone]   Fax:  [phone]  Name: Michelle Randall MRN: [id number] Date of Birth: Jan 21, 1996

## 2016-09-14 ENCOUNTER — Encounter: Payer: Self-pay | Admitting: Physical Therapy

## 2016-09-14 ENCOUNTER — Ambulatory Visit: Payer: Medicaid Other | Admitting: Occupational Therapy

## 2016-09-14 ENCOUNTER — Encounter: Payer: Self-pay | Admitting: Occupational Therapy

## 2016-09-14 ENCOUNTER — Ambulatory Visit: Payer: Medicaid Other | Admitting: Physical Therapy

## 2016-09-14 DIAGNOSIS — R278 Other lack of coordination: Secondary | ICD-10-CM

## 2016-09-14 DIAGNOSIS — M6281 Muscle weakness (generalized): Secondary | ICD-10-CM

## 2016-09-14 DIAGNOSIS — R262 Difficulty in walking, not elsewhere classified: Secondary | ICD-10-CM

## 2016-09-14 NOTE — Therapy (Signed)
[facility] [address] [place], [place], [address] Phone: [phone]   Fax:  [phone]  Physical Therapy Treatment  Patient Details  Name: Michelle Randall MRN: [id number] Date of Birth: 08-02-1996 No Data Recorded  Encounter Date: 09/14/2016      PT End of Session - 09/14/16 1538    Visit Number 59   Date for PT Re-Evaluation 08/09/16   Authorization Type medicaid   PT Start Time 0315   PT Stop Time 0400   PT Time Calculation (min) 45 min   Equipment Utilized During Treatment Gait belt   Activity Tolerance Patient tolerated treatment well   Behavior During Therapy [facility] for tasks assessed/performed      History reviewed. No pertinent past medical history.  History reviewed. No pertinent surgical history.  There were no vitals filed for this visit.      Subjective Assessment - 09/14/16 1536    Subjective patient is having difficutly with prone hip flexion stretch and needs instruction.    Patient Stated Goals Patient wants to improve her core strength.    Currently in Pain? No/denies   Pain Score 0-No pain       Therapeutic exercise: Quadriped hand and knees alternating UE extension x 5 sec x 20  Prone planks x 1 minute x 3 sets Side planks left and right x 1 minute x 3 sets  Prone knee flex stretch x 30 sec x 3 Leg press x 100 lbs x 20 x 3 Patient needs occasional verbal cueing to improve posture and cueing to correctly perform exercises slowly, holding at end of range to increase motor firing of desired muscle to encourage fatigue.                           PT Education - 09/14/16 1537    Education provided No   Education Details Patient needs instruction for stretching.    Person(s) Educated Patient   Methods Explanation   Comprehension Verbalized understanding             PT Long Term Goals - 08/17/16 0942      PT LONG TERM GOAL #1   Title Patient will improve  Dynamic Gait Index (DGI) score to > 19/24 for low falls risk regarding dynamic walking tasks    Baseline DGI 14/24   Time 12   Status On-going     PT LONG TERM GOAL #2   Title Patient will increase Berg Balance score by > 6 points to demonstrate decreased fall risk during functional activities    Time 12   Status On-going     PT LONG TERM GOAL #3   Title Patient will be able to transfer in and out of a large car with Patient will be able to transfer in and out of a high seated car with CGA    Baseline Patient requires min for transfer into large car   Time 12   Status On-going     PT LONG TERM GOAL #4   Title Patient will complete a TUG test in < 12 seconds for independent mobility and decreased fall risk    Baseline 11.20 sec   Time 12   Period Weeks   Status On-going     PT LONG TERM GOAL #5   Title Patient will improve 6 minute walk distance by > 150 ft for improved return to functional community activities  Baseline 640 feet   Time 12   Status On-going     PT LONG TERM GOAL #6   Title Patient will improve gait speed to > 1.2 m/s with least restrictive assistive device to return to normal walking speed    Baseline .80 m/sec   Time 12   Period Weeks   Status On-going     PT LONG TERM GOAL #7   Title Patient (< 19 years old) will complete five times sit to stand test in < 10 seconds indicating an increased LE strength and improved balance    Time 10.56   Status On-going     PT LONG TERM GOAL #8   Title Patient will be able to ambulate on inclines and grass independenlty with LRAD   Baseline Patient needs SBA for ambulation in grass or ueven surfaces   Time 12   Period Weeks   Status On-going     PT LONG TERM GOAL  #9   TITLE Patient will be abe to transfer from low chair or stool wihtout UE support independently   Baseline Patient needs min asssit to transfer from a low stool   Time 12   Status On-going     PT LONG TERM GOAL  #10   TITLE Patient will be able  to transfer from the floor to standing independently with use of LRAD   Baseline Patient needs min assist to transfer from the floor to standing   Time 12   Status On-going               Plan - 09/14/16 1540    Clinical Impression Statement Patient has weakness in core and decreased hip extension flexibility . She is able to perform exercises and stretches with some faitgue and rest periods.    Rehab Potential Good   Clinical Impairments Affecting Rehab Potential weakness and decreased standing balance   PT Frequency 1x / week   PT Duration 12 weeks   PT Treatment/Interventions Therapeutic exercise;Therapeutic activities;Gait training;Balance training;Stair training;DME Instruction;Neuromuscular re-education;Patient/family education   PT Next Visit Plan Bil knee flexion/extension strengthening; Progress LE, core strengthening and balance   PT Home Exercise Plan Continued from previous sessions.       Patient will benefit from skilled therapeutic intervention in order to improve the following deficits and impairments:  Abnormal gait, Decreased balance, Decreased endurance, Difficulty walking, Decreased strength  Visit Diagnosis: Muscle weakness (generalized)  Other lack of coordination  Difficulty in walking, not elsewhere classified     Problem List There are no active problems to display for this patient.  Ezekiel InaKristine S Jaquesha Boroff, PT, DPT [place], Barkley BrunsKristine S 09/14/2016, 3:44 PM  [facility] [address] [place], [place], [address] Phone: [phone]   Fax:  [phone]  Name: Michelle Randall MRN: [id number] Date of Birth: Aug 29, 1996

## 2016-09-14 NOTE — Therapy (Signed)
[facility] [address] [place], [place], [address] Phone: [phone]   Fax:  [phone]  Occupational Therapy Treatment  Patient Details  Name: Michelle Randall MRN: [id number] Date of Birth: 1996/04/21 No Data Recorded  Encounter Date: 09/14/2016      OT End of Session - 09/14/16 1514    Visit Number 50   Number of Visits 70   Date for OT Re-Evaluation 10/05/16   Authorization Type medicaid visit 28 of 48   OT Start Time 1430   OT Stop Time 1515   OT Time Calculation (min) 45 min   Activity Tolerance Patient tolerated treatment well   Behavior During Therapy [facility] for tasks assessed/performed      History reviewed. No pertinent past medical history.  History reviewed. No pertinent surgical history.  There were no vitals filed for this visit.      Subjective Assessment - 09/14/16 1512    Subjective  Patient reports she has difficulty with opening containers that have a seal such as yogurt.     Patient Stated Goals wants to be independent with all tasks so she will be able to go away to college.   Currently in Pain? No/denies   Pain Score 0-No pain                      OT Treatments/Exercises (OP) - 09/14/16 1523      Fine Motor Coordination   Other Fine Motor Exercises Patient seen this date for focus on fine motor coordination tasks with manipulation of small objects such as 2 in small sticks to place into board with using oppositional movements, use of translatory movements of the hand and using the hand for storage to remove with cues for technique.  Progressed to  1/4 inch and smaller with bilateral UEs.  Cues for prehension patterns and technique picking up from different surfaces, flat, rounded bowl, etc.                  OT Education - 09/14/16 1513    Education provided Yes   Education Details functional hand exercises with manipulation of small objects around the house   Person(s) Educated Patient   Methods Explanation;Demonstration;Verbal cues   Comprehension Verbal cues required;Returned demonstration;Verbalized understanding             OT Long Term Goals - 07/15/16 2103      OT LONG TERM GOAL #1   Title Patient will demonstrate methods of picking up her bookbag and placing it onto the back of her chair with modified independence to transport items to class.   Baseline requires assistance from others to complete now but will need to be independent when at college in the fall .   Time 6   Period Months   Status On-going     OT LONG TERM GOAL #2   Title Patient will be able to make her bed with modified independence.   Baseline she continues to have difficulty with balance and completing this task, moderate assist.   Time 6   Period Months     OT LONG TERM GOAL #3   Title Patient will demonstrate the ability to perform vacuuming of one room with modified independence using light weight vacuum.   Baseline still requires max assist   Time 6   Period Months   Status On-going     OT LONG TERM GOAL #5   Title Patient will  demonstrate donning and doffing her bra with modified independence.   Baseline max assist   Time 12   Period Weeks   Status Achieved     OT LONG TERM GOAL #6   Title Will now be able to go through a door that swings out using motorized wheel chair.   Baseline difficulty depending on type of door and set up of the area, at times requires max assistance depending on the weight of the door.   Time 6   Period Months   Status Achieved     OT LONG TERM GOAL  #9   Baseline Patient will be able to braid her own hair before college   Time 6   Period Months     OT LONG TERM GOAL  #10   TITLE Wll be able to wash her own hair before starting college   Baseline mom still has to help with more than 20% of task now down from 50%.     Time 6   Period Months   Status Achieved     OT LONG TERM GOAL  #11   TITLE Will be able to  squeeze tooth paste out of the tube when it is less than half full.   Baseline still requires min assist if tube is less than 1/2 full.   Time 6   Period Months   Status On-going     OT LONG TERM GOAL  #12   TITLE Patient will demonstrate managing wheelchair rain poncho in inclement weather with modified independence.   Baseline crutch tips are difficult for anyone to take them off, goal deferred.   Time 6   Period Months   Status On-going     OT LONG TERM GOAL  #13   TITLE Patient will be able to complete 2 1/2 minutes straight with left shoulder flexion simulating driving with a stearing wheel ball to be able to drive to college.    Baseline can do 30 second of shoulder flexion with 2 1/2 before fatigue and can only drive 15 min before right shoulder fatigues.   Time 6   Period Months   Status On-going     OT LONG TERM GOAL  #14   TITLE Will be able to open taped boxes (for moving to college) using a key or knife.   Baseline ongoing techniques explored with adaptive devices to assist.   Time 6   Period Months   Status Deferred     OT LONG TERM GOAL  #15   TITLE Patient will be able to obtain food tray in the cafeteria from a wheelchair level and take to the table without spilling with modified independence.    Baseline max assist   Time 6   Period Months   Status Achieved               Plan - 09/14/16 1514    Clinical Impression Statement Patient continues to progress well with hand skills towards school related tasks.  She does demonstrate difficulty with ntainers such as yogurt which requires increased pinch and pulling to remove sealed layer, will continue to work towards strength in pinch, grip and coordination to complete these tasks.  Patient needs to review tasks at home and school and determine further goals and/or need for continued OT services in the next couple weeks.     Rehab Potential Good   OT Frequency 2x / week   OT Duration 12 weeks   OT  Treatment/Interventions  Self-care/ADL training;Therapeutic exercise;Therapeutic exercises;Therapeutic activities   Consulted and Agree with Plan of Care Patient   Family Member Consulted mom      Patient will benefit from skilled therapeutic intervention in order to improve the following deficits and impairments:  Impaired UE functional use, Difficulty walking, Decreased strength, Decreased coordination, Decreased endurance, Decreased balance  Visit Diagnosis: Muscle weakness (generalized)  Other lack of coordination    Problem List There are no active problems to display for this patient.  Kerrie Buffalo, OTR/L, CLT  Michelle Randall 09/14/2016, 3:25 PM  [place] [facility] MAIN [facility] SERVICES [address] [place], [place], [address] Phone: [phone]   Fax:  [phone]  Name: Michelle Randall MRN: [id number] Date of Birth: [birth year]

## 2016-09-21 ENCOUNTER — Ambulatory Visit: Payer: Medicaid Other | Admitting: Physical Therapy

## 2016-09-21 ENCOUNTER — Ambulatory Visit: Payer: Medicaid Other | Attending: Pediatrics | Admitting: Occupational Therapy

## 2016-09-21 ENCOUNTER — Encounter: Payer: Self-pay | Admitting: Physical Therapy

## 2016-09-21 DIAGNOSIS — R278 Other lack of coordination: Secondary | ICD-10-CM | POA: Diagnosis present

## 2016-09-21 DIAGNOSIS — M6281 Muscle weakness (generalized): Secondary | ICD-10-CM

## 2016-09-21 DIAGNOSIS — R279 Unspecified lack of coordination: Secondary | ICD-10-CM | POA: Diagnosis present

## 2016-09-21 DIAGNOSIS — R262 Difficulty in walking, not elsewhere classified: Secondary | ICD-10-CM | POA: Diagnosis present

## 2016-09-21 NOTE — Therapy (Signed)
[place] [facility] [address] [place], [place], [address] Phone: [phone]   Fax:  [phone]  Physical Therapy Treatment  Patient Details  Name: Michelle Randall MRN: [id number] Date of Birth: Mar 06, 1996 No Data Recorded  Encounter Date: 09/21/2016      PT End of Session - 09/21/16 1539    Visit Number 60   Date for PT Re-Evaluation 08/09/16   Authorization Type medicaid   PT Start Time 0315   PT Stop Time 0400   PT Time Calculation (min) 45 min   Equipment Utilized During Treatment Gait belt   Activity Tolerance Patient tolerated treatment well   Behavior During Therapy [facility] for tasks assessed/performed      History reviewed. No pertinent past medical history.  History reviewed. No pertinent surgical history.  There were no vitals filed for this visit.      Subjective Assessment - 09/21/16 1538    Subjective patient reports doing her prone hip flexor stretch.    Patient is accompained by: Family member   Limitations Walking   Patient Stated Goals Patient wants to improve her core strength.    Currently in Pain? No/denies   Pain Score 0-No pain   Pain Onset In the past 7 days      Therapeutic exercise: Quadriped hand and knees alternating UE extension x 5 sec x 20  Prone planks x 1 minute x 3 sets Side planks left and right x 1 minute x 3 sets  Prone knee flex stretch x 30 sec x 3 Leg press x 100 lbs x 20 x 3 Patient needs occasional verbal cueing to improve posture and cueing to correctly perform exercises slowly, holding at end of range to increase motor firing of desired muscle to encourage fatigue.                             PT Education - 09/21/16 1538    Education provided No   Education Details HEP for stretching   Person(s) Educated Patient   Methods Explanation   Comprehension Verbalized understanding             PT Long Term Goals - 08/17/16 0942      PT  LONG TERM GOAL #1   Title Patient will improve Dynamic Gait Index (DGI) score to > 19/24 for low falls risk regarding dynamic walking tasks    Baseline DGI 14/24   Time 12   Status On-going     PT LONG TERM GOAL #2   Title Patient will increase Berg Balance score by > 6 points to demonstrate decreased fall risk during functional activities    Time 12   Status On-going     PT LONG TERM GOAL #3   Title Patient will be able to transfer in and out of a large car with Patient will be able to transfer in and out of a high seated car with CGA    Baseline Patient requires min for transfer into large car   Time 12   Status On-going     PT LONG TERM GOAL #4   Title Patient will complete a TUG test in < 12 seconds for independent mobility and decreased fall risk    Baseline 11.20 sec   Time 12   Period Weeks   Status On-going     PT LONG TERM GOAL #5   Title Patient will improve 6 minute walk  distance by > 150 ft for improved return to functional community activities    Baseline 640 feet   Time 12   Status On-going     PT LONG TERM GOAL #6   Title Patient will improve gait speed to > 1.2 m/s with least restrictive assistive device to return to normal walking speed    Baseline .80 m/sec   Time 12   Period Weeks   Status On-going     PT LONG TERM GOAL #7   Title Patient (< 19 years old) will complete five times sit to stand test in < 10 seconds indicating an increased LE strength and improved balance    Time 10.56   Status On-going     PT LONG TERM GOAL #8   Title Patient will be able to ambulate on inclines and grass independenlty with LRAD   Baseline Patient needs SBA for ambulation in grass or ueven surfaces   Time 12   Period Weeks   Status On-going     PT LONG TERM GOAL  #9   TITLE Patient will be abe to transfer from low chair or stool wihtout UE support independently   Baseline Patient needs min asssit to transfer from a low stool   Time 12   Status On-going     PT  LONG TERM GOAL  #10   TITLE Patient will be able to transfer from the floor to standing independently with use of LRAD   Baseline Patient needs min assist to transfer from the floor to standing   Time 12   Status On-going               Plan - 09/21/16 1540    Clinical Impression Statement  Fatigue with sit to stand but demonstrating more control, Increase weight for standing exercises. Fatigue still evident with nu-step and endurance.    Rehab Potential Good   Clinical Impairments Affecting Rehab Potential weakness and decreased standing balance   PT Frequency 1x / week   PT Duration 12 weeks   PT Treatment/Interventions Therapeutic exercise;Therapeutic activities;Gait training;Balance training;Stair training;DME Instruction;Neuromuscular re-education;Patient/family education   PT Next Visit Plan Bil knee flexion/extension strengthening; Progress LE, core strengthening and balance   PT Home Exercise Plan Continued from previous sessions.       Patient will benefit from skilled therapeutic intervention in order to improve the following deficits and impairments:  Abnormal gait, Decreased balance, Decreased endurance, Difficulty walking, Decreased strength  Visit Diagnosis: Muscle weakness (generalized)  Difficulty in walking, not elsewhere classified     Problem List There are no active problems to display for this patient.   Ezekiel InaMansfield, Lilit Cinelli S 09/21/2016, 3:42 PM  [place] [facility] [address] [place], [place], [address] Phone: [phone]   Fax:  [phone]  Name: Michelle Randall MRN: [id number] Date of Birth: 08-20-1996

## 2016-09-24 ENCOUNTER — Encounter: Payer: Self-pay | Admitting: Occupational Therapy

## 2016-09-24 NOTE — Therapy (Signed)
[place] MAIN [facility] SERVICES [address] [place], [place], [address] Phone: [phone]   Fax:  [phone]  Occupational Therapy Treatment  Patient Details  Name: Michelle Randall MRN: [id number] Date of Birth: 11-30-1995 No Data Recorded  Encounter Date: 09/21/2016      OT End of Session - 09/24/16 1934    Visit Number 51   Number of Visits 70   Date for OT Re-Evaluation 10/05/16   Authorization Type medicaid visit 20 of 82   OT Start Time 1432   OT Stop Time 1515   OT Time Calculation (min) 43 min   Activity Tolerance Patient tolerated treatment well   Behavior During Therapy [facility] for tasks assessed/performed      History reviewed. No pertinent past medical history.  History reviewed. No pertinent surgical history.  There were no vitals filed for this visit.      Subjective Assessment - 09/24/16 1917    Subjective  Patient reports she had met her previous goal of putting on her socks however, she has difficulty with putting on any new socks, they are tight and she does not have the strength to pull them on and up.  Reports her book bag is getting heavier as the semester goes on and has some difficulty with lifting the bag from the back of her wheelchair and the floor.     Patient Stated Goals wants to be independent with all tasks so she will be able to go away to college.   Currently in Pain? No/denies   Pain Score 0-No pain                      OT Treatments/Exercises (OP) - 09/24/16 1919      ADLs   ADL Comments Patient seen for trials of simulated hair braid with arms overhead reaching behind head, able to complete timed trials, 4 trials:  53 secs, 2 minutes 52 secs, 2 minutes 51 sec and 3 minutes 27 secs.  short rest breaks between sets.       Fine Motor Coordination   Other Fine Motor Exercises Patient seen for fine motor coordination tasks of manipulation of grooved pegs with emphasis on prehension  patterns as well as translatory movements and using the hand for storage.                 OT Education - 09/24/16 1934    Education provided Yes   Education Details exercises for shoulder strengthening, coordination and braiding.   Person(s) Educated Patient   Methods Explanation;Demonstration;Verbal cues   Comprehension Verbal cues required;Returned demonstration;Verbalized understanding             OT Long Term Goals - 09/21/16 1440      OT LONG TERM GOAL #1   Title (P)  Patient will demonstrate methods of picking up her bookbag and placing it onto the back of her chair with modified independence to transport items to class.   Baseline (P)  requires assistance from others to complete now but will need to be independent when at college in the fall .   Time (P)  6   Period (P)  Months   Status (P)  On-going     OT LONG TERM GOAL #2   Title (P)  Patient will be able to make her bed with modified independence.   Baseline (P)  she continues to have difficulty with balance and completing this task, increased time.  Time (P)  6   Period (P)  Months   Status (P)  On-going     OT LONG TERM GOAL #3   Title (P)  Patient will demonstrate the ability to perform vacuuming of one room with modified independence using light weight vacuum.   Baseline (P)  still requires max assist   Time (P)  6   Period (P)  Months   Status (P)  On-going     OT LONG TERM GOAL #5   Title (P)  Patient will demonstrate donning and doffing her bra with modified independence.   Baseline (P)  max assist   Time (P)  12   Period (P)  Weeks   Status (P)  Achieved     OT LONG TERM GOAL #6   Title (P)  Will now be able to go through a door that swings out using motorized wheel chair.   Baseline (P)  difficulty depending on type of door and set up of the area, at times requires max assistance depending on the weight of the door.   Time (P)  6   Period (P)  Months   Status (P)  Achieved     OT  LONG TERM GOAL  #9   Baseline (P)  Patient will be able to braid her own hair before college   Time (P)  6   Period (P)  Months   Status (P)  On-going     OT LONG TERM GOAL  #10   TITLE (P)  Wll be able to wash her own hair before starting college   Baseline (P)  mom still has to help with more than 20% of task now down from 50%.     Time (P)  6   Period (P)  Months   Status (P)  Achieved     OT LONG TERM GOAL  #11   TITLE (P)  Will be able to squeeze tooth paste out of the tube when it is less than half full.   Baseline (P)  difficulty with small travel sized.   Time (P)  6   Period (P)  Months   Status (P)  On-going     OT LONG TERM GOAL  #12   TITLE (P)  Patient will demonstrate managing wheelchair rain poncho in inclement weather with modified independence.   Baseline (P)  crutch tips are difficult for anyone to take them off, goal deferred.   Time (P)  6   Period (P)  Months   Status (P)  Achieved     OT LONG TERM GOAL  #13   TITLE (P)  Patient will be able to complete 2 1/2 minutes straight with left shoulder flexion simulating driving with a stearing wheel ball to be able to drive to college.    Baseline (P)  can do 30 second of shoulder flexion with 2 1/2 before fatigue and can only drive 15 min before right shoulder fatigues.   Time (P)  6   Period (P)  Months   Status (P)  On-going     OT LONG TERM GOAL  #14   TITLE (P)  Will be able to open taped boxes (for moving to college) using a key or knife.   Baseline (P)  ongoing techniques explored with adaptive devices to assist.   Time (P)  6   Period (P)  Months   Status (P)  Deferred     OT LONG TERM GOAL  #15  TITLE (P)  Patient will be able to obtain food tray in the cafeteria from a wheelchair level and take to the table without spilling with modified independence.    Baseline (P)  max assist   Time (P)  6   Period (P)  Months   Status (P)  Achieved               Plan - 09/24/16 1954    Clinical  Impression Statement Patient continues to demo difficulty with arm strength and endurance to complete braids down the back of her head, able to do side braids.  She also has difficulty with managing necklace and bracelet clasps and will need to work on more or look at alternatives.  Will plan to reassess goals and POC in the next 1-2 visits and determine further needs.     Rehab Potential Good   OT Frequency 2x / week   OT Duration 12 weeks   OT Treatment/Interventions Self-care/ADL training;Therapeutic exercise;Therapeutic exercises;Therapeutic activities   Consulted and Agree with Plan of Care Patient      Patient will benefit from skilled therapeutic intervention in order to improve the following deficits and impairments:  Impaired UE functional use, Difficulty walking, Decreased strength, Decreased coordination, Decreased endurance, Decreased balance  Visit Diagnosis: Muscle weakness (generalized)  Other lack of coordination    Problem List There are no active problems to display for this patient.  Achilles Dunk, OTR/L, CLT  Lovett,Amy 09/24/2016, 7:58 PM  [place] MAIN [facility] SERVICES [address] [place], [place], [address] Phone: [phone]   Fax:  [phone]  Name: LATESSA TILLIS MRN: [id number] Date of Birth: September 17, 1996

## 2016-09-28 ENCOUNTER — Ambulatory Visit: Payer: Medicaid Other | Admitting: Occupational Therapy

## 2016-09-28 ENCOUNTER — Encounter: Payer: Self-pay | Admitting: Physical Therapy

## 2016-09-28 ENCOUNTER — Encounter: Payer: Self-pay | Admitting: Occupational Therapy

## 2016-09-28 ENCOUNTER — Ambulatory Visit: Payer: Medicaid Other | Admitting: Physical Therapy

## 2016-09-28 DIAGNOSIS — M6281 Muscle weakness (generalized): Secondary | ICD-10-CM | POA: Diagnosis not present

## 2016-09-28 DIAGNOSIS — R262 Difficulty in walking, not elsewhere classified: Secondary | ICD-10-CM

## 2016-09-28 DIAGNOSIS — R278 Other lack of coordination: Secondary | ICD-10-CM

## 2016-09-28 NOTE — Therapy (Signed)
[place] [facility] [address] [place], [place], [address] Phone: [phone]   Fax:  [phone]  Physical Therapy Treatment  Patient Details  Name: Michelle Randall MRN: [id number] Date of Birth: 09-21-1996 No Data Recorded  Encounter Date: 09/28/2016      PT End of Session - 09/28/16 1629    Visit Number 60   Date for PT Re-Evaluation 08/09/16   Authorization Type medicaid   PT Start Time 0325   PT Stop Time 0410   PT Time Calculation (min) 45 min   Equipment Utilized During Treatment Gait belt   Activity Tolerance Patient tolerated treatment well   Behavior During Therapy [facility] for tasks assessed/performed      History reviewed. No pertinent past medical history.  History reviewed. No pertinent surgical history.  There were no vitals filed for this visit.      Subjective Assessment - 09/28/16 1627    Subjective patient reports doing her prone hip flexor stretch.    Patient is accompained by: Family member   Limitations Walking   Patient Stated Goals Patient wants to improve her core strength.    Pain Onset In the past 7 days      TM walking with elevation of 2 and 1. 0 miles/ hour x 10 minutes UBE for UE strengthening x 5 minutes level 2 Quadriped UE/LE with extension x 5 sec hold Prone knee flex stretch x 30 sec x 3 CGA and Min to mod verbal cues used throughout with increased in postural sway Continues to have balance deficits typical with diagnosis. Patient performs intermediate level exercises without pain behaviors and needs verbal cuing for postural alignment and head positioning .               PT Education - 09/28/16 1627    Education provided No   Education Details HEP progression   Person(s) Educated Patient   Methods Explanation   Comprehension Verbalized understanding             PT Long Term Goals - 08/17/16 0942      PT LONG TERM GOAL #1   Title Patient will improve  Dynamic Gait Index (DGI) score to > 19/24 for low falls risk regarding dynamic walking tasks    Baseline DGI 14/24   Time 12   Status On-going     PT LONG TERM GOAL #2   Title Patient will increase Berg Balance score by > 6 points to demonstrate decreased fall risk during functional activities    Time 12   Status On-going     PT LONG TERM GOAL #3   Title Patient will be able to transfer in and out of a large car with Patient will be able to transfer in and out of a high seated car with CGA    Baseline Patient requires min for transfer into large car   Time 12   Status On-going     PT LONG TERM GOAL #4   Title Patient will complete a TUG test in < 12 seconds for independent mobility and decreased fall risk    Baseline 11.20 sec   Time 12   Period Weeks   Status On-going     PT LONG TERM GOAL #5   Title Patient will improve 6 minute walk distance by > 150 ft for improved return to functional community activities    Baseline 640 feet   Time 12   Status On-going  PT LONG TERM GOAL #6   Title Patient will improve gait speed to > 1.2 m/s with least restrictive assistive device to return to normal walking speed    Baseline .80 m/sec   Time 12   Period Weeks   Status On-going     PT LONG TERM GOAL #7   Title Patient (< 19 years old) will complete five times sit to stand test in < 10 seconds indicating an increased LE strength and improved balance    Time 10.56   Status On-going     PT LONG TERM GOAL #8   Title Patient will be able to ambulate on inclines and grass independenlty with LRAD   Baseline Patient needs SBA for ambulation in grass or ueven surfaces   Time 12   Period Weeks   Status On-going     PT LONG TERM GOAL  #9   TITLE Patient will be abe to transfer from low chair or stool wihtout UE support independently   Baseline Patient needs min asssit to transfer from a low stool   Time 12   Status On-going     PT LONG TERM GOAL  #10   TITLE Patient will be able  to transfer from the floor to standing independently with use of LRAD   Baseline Patient needs min assist to transfer from the floor to standing   Time 12   Status On-going               Plan - 09/28/16 1632    Clinical Impression Statement Patient needs CGA for quadriped exercises with UE and LE extension. She is joining a spin class and will use a bike with hand controls.    Rehab Potential Good   Clinical Impairments Affecting Rehab Potential weakness and decreased standing balance   PT Frequency 1x / week   PT Duration 12 weeks   PT Treatment/Interventions Therapeutic exercise;Therapeutic activities;Gait training;Balance training;Stair training;DME Instruction;Neuromuscular re-education;Patient/family education   PT Next Visit Plan Bil knee flexion/extension strengthening; Progress LE, core strengthening and balance   PT Home Exercise Plan Continued from previous sessions.       Patient will benefit from skilled therapeutic intervention in order to improve the following deficits and impairments:  Abnormal gait, Decreased balance, Decreased endurance, Difficulty walking, Decreased strength  Visit Diagnosis: Muscle weakness (generalized)  Difficulty in walking, not elsewhere classified     Problem List There are no active problems to display for this patient.   Ezekiel InaMansfield, Inice Sanluis S 09/28/2016, 4:36 PM  [place] [facility] [address] [place], [place], [address] Phone: [phone]   Fax:  [phone]  Name: Michelle Randall MRN: [id number] Date of Birth: [birth year]

## 2016-10-01 ENCOUNTER — Encounter: Payer: Self-pay | Admitting: Occupational Therapy

## 2016-10-01 NOTE — Therapy (Signed)
[place] [facility] [address] [place], [place], [address] Phone: [phone]   Fax:  [phone]  Occupational Therapy Treatment  Patient Details  Name: Michelle Randall MRN: [id number] Date of Birth: May 15, 1996 No Data Recorded  Encounter Date: 09/28/2016      OT End of Session - 10/01/16 1443    Visit Number 52   Number of Visits 70   Date for OT Re-Evaluation 10/05/16   Authorization Type medicaid visit 30 of 48   OT Start Time 1430   OT Stop Time 1516   OT Time Calculation (min) 46 min   Activity Tolerance Patient tolerated treatment well   Behavior During Therapy [facility] for tasks assessed/performed      History reviewed. No pertinent past medical history.  History reviewed. No pertinent surgical history.  There were no vitals filed for this visit.      Subjective Assessment - 10/01/16 1437    Subjective  Patient reports she is getting her wisdom teeth out on Saturday.  Is not looking forwards to it.  She now has her schedule for next semester.  She will have to get her therapy appointments changed to the morning for winter term since she has school in the afternoon.     Patient is accompained by: Family member   Patient Stated Goals wants to be independent with all tasks so she will be able to go away to college.   Currently in Pain? No/denies   Pain Score 0-No pain                      OT Treatments/Exercises (OP) - 10/01/16 1439      Fine Motor Coordination   Other Fine Motor Exercises Patient seen for manipulation of mini clips to place onto 2 card sets, requires flipping cards to opposite sides of each other.  place and remove with cues, emphasis on speed and dexterity.       Neurological Re-education Exercises   Other Exercises 1 Patient seen for finger stregnthening and coordination in addition to reaching to place push pins onto bulletin board to display important papers.  Cues for  technique.  Items placed upright in vertical plane of motion.  Grip strengthening 17.9# for 25 reps, 23.4# for 25 reps each hand..                OT Education - 10/01/16 1443    Education provided Yes   Education Details home exercises and functional hand tasks with BUE.   Person(s) Educated Patient   Methods Explanation;Demonstration;Verbal cues   Comprehension Verbal cues required;Returned demonstration;Verbalized understanding             OT Long Term Goals - 09/21/16 1440      OT LONG TERM GOAL #1   Title (P)  Patient will demonstrate methods of picking up her bookbag and placing it onto the back of her chair with modified independence to transport items to class.   Baseline (P)  requires assistance from others to complete now but will need to be independent when at college in the fall .   Time (P)  6   Period (P)  Months   Status (P)  On-going     OT LONG TERM GOAL #2   Title (P)  Patient will be able to make her bed with modified independence.   Baseline (P)  she continues to have difficulty with balance and completing this task, increased  time.   Time (P)  6   Period (P)  Months   Status (P)  On-going     OT LONG TERM GOAL #3   Title (P)  Patient will demonstrate the ability to perform vacuuming of one room with modified independence using light weight vacuum.   Baseline (P)  still requires max assist   Time (P)  6   Period (P)  Months   Status (P)  On-going     OT LONG TERM GOAL #5   Title (P)  Patient will demonstrate donning and doffing her bra with modified independence.   Baseline (P)  max assist   Time (P)  12   Period (P)  Weeks   Status (P)  Achieved     OT LONG TERM GOAL #6   Title (P)  Will now be able to go through a door that swings out using motorized wheel chair.   Baseline (P)  difficulty depending on type of door and set up of the area, at times requires max assistance depending on the weight of the door.   Time (P)  6   Period (P)   Months   Status (P)  Achieved     OT LONG TERM GOAL  #9   Baseline (P)  Patient will be able to braid her own hair before college   Time (P)  6   Period (P)  Months   Status (P)  On-going     OT LONG TERM GOAL  #10   TITLE (P)  Wll be able to wash her own hair before starting college   Baseline (P)  mom still has to help with more than 20% of task now down from 50%.     Time (P)  6   Period (P)  Months   Status (P)  Achieved     OT LONG TERM GOAL  #11   TITLE (P)  Will be able to squeeze tooth paste out of the tube when it is less than half full.   Baseline (P)  difficulty with small travel sized.   Time (P)  6   Period (P)  Months   Status (P)  On-going     OT LONG TERM GOAL  #12   TITLE (P)  Patient will demonstrate managing wheelchair rain poncho in inclement weather with modified independence.   Baseline (P)  crutch tips are difficult for anyone to take them off, goal deferred.   Time (P)  6   Period (P)  Months   Status (P)  Achieved     OT LONG TERM GOAL  #13   TITLE (P)  Patient will be able to complete 2 1/2 minutes straight with left shoulder flexion simulating driving with a stearing wheel ball to be able to drive to college.    Baseline (P)  can do 30 second of shoulder flexion with 2 1/2 before fatigue and can only drive 15 min before right shoulder fatigues.   Time (P)  6   Period (P)  Months   Status (P)  On-going     OT LONG TERM GOAL  #14   TITLE (P)  Will be able to open taped boxes (for moving to college) using a key or knife.   Baseline (P)  ongoing techniques explored with adaptive devices to assist.   Time (P)  6   Period (P)  Months   Status (P)  Deferred     OT LONG TERM GOAL  #  15   TITLE (P)  Patient will be able to obtain food tray in the cafeteria from a wheelchair level and take to the table without spilling with modified independence.    Baseline (P)  max assist   Time (P)  6   Period (P)  Months   Status (P)  Achieved                Plan - 10/01/16 1506    Clinical Impression Statement Patient demonstrates difficulty with using push pins to place papers on the bulletin board, continue to work towards finger stregnth to perform this task for school and work related skills.  Will plan to reassess patient next session for her recert, update goals and submit for medicaid reauthorization.    Rehab Potential Good   OT Frequency 2x / week   OT Duration 12 weeks   OT Treatment/Interventions Self-care/ADL training;Therapeutic exercise;Therapeutic exercises;Therapeutic activities   Consulted and Agree with Plan of Care Patient      Patient will benefit from skilled therapeutic intervention in order to improve the following deficits and impairments:  Impaired UE functional use, Difficulty walking, Decreased strength, Decreased coordination, Decreased endurance, Decreased balance  Visit Diagnosis: Muscle weakness (generalized)  Other lack of coordination    Problem List There are no active problems to display for this patient.  Kerrie Buffalo, OTR/L, CLT  Lyriq Jarchow 10/01/2016, 3:10 PM  [place] [facility] MAIN [facility] SERVICES [address] [place], [place], [address] Phone: [phone]   Fax:  [phone]  Name: Michelle Randall MRN: [id number] Date of Birth: 03-25-1996

## 2016-10-05 ENCOUNTER — Ambulatory Visit: Payer: Medicaid Other | Admitting: Occupational Therapy

## 2016-10-05 ENCOUNTER — Encounter: Payer: Self-pay | Admitting: Occupational Therapy

## 2016-10-05 ENCOUNTER — Ambulatory Visit: Payer: Medicaid Other | Admitting: Physical Therapy

## 2016-10-05 DIAGNOSIS — R278 Other lack of coordination: Secondary | ICD-10-CM

## 2016-10-05 DIAGNOSIS — M6281 Muscle weakness (generalized): Secondary | ICD-10-CM | POA: Diagnosis not present

## 2016-10-05 DIAGNOSIS — R262 Difficulty in walking, not elsewhere classified: Secondary | ICD-10-CM

## 2016-10-05 NOTE — Therapy (Signed)
[place] MAIN [facility] SERVICES [address] [place], [place], [address] Phone: [phone]   Fax:  [phone]  Occupational Therapy Treatment/Reassessment  Patient Details  Name: Michelle Randall MRN: [id number] Date of Birth: [birth year] No Data Recorded  Encounter Date: 10/05/2016      OT End of Session - 10/05/16 1500    Visit Number 73   Number of Visits 5   Date for OT Re-Evaluation 12/27/16   Authorization Type medicaid visit 28 of 79   OT Start Time 1430   OT Stop Time 1515   OT Time Calculation (min) 45 min   Activity Tolerance Patient tolerated treatment well   Behavior During Therapy [facility] for tasks assessed/performed      History reviewed. No pertinent past medical history.  History reviewed. No pertinent surgical history.  There were no vitals filed for this visit.      Subjective Assessment - 10/05/16 1438    Subjective  Patient reports she will need to have morning appointments in January due to winter term.  Patient reports she had her wisdom teeth taken out on Saturday and has done really well, no pain.  Has her schedule for winter and spring semesters.    Patient is accompained by: Family member   Patient Stated Goals wants to be independent with all tasks so she will be able to go away to college.   Currently in Pain? No/denies   Pain Score 0-No pain                      OT Treatments/Exercises (OP) - 10/05/16 1515      ADLs   ADL Comments Patient seen for ADL reassessment and goals updated. Patient continues to demonstrate difficulty with performing braiding of hair, she is able to do a side braid but unable to complete a braid at the back of the head.  She continues to demo difficulty with squeezing toothpaste out of the tube and requires min assist at times.  She has not been able to vacuum, remains max assist.  She is able to assist with bed making but has difficulty with moving around the bed  and to manage with loft strand crutches while using either hand to manage bed linens.  She has increased difficulty with opening containers of yogurt and ice cream which have the sealed wrapper that requires pinch and to pull it off.  She requires max assist to don and doff small earrings.  Small buttons are difficult and require increased time and mom has to perform 50% of the time and always under a time constraint.  Patient also requires assistance with pulling her jeans over her leg braces.       Fine Motor Coordination   Other Fine Motor Exercises Patient seen for reassessment of coordination with 9 hole peg test right hand 21 secs, left hand 22 secs.  Difficulty in the clinic with small buttons.     Neurological Re-education Exercises   Other Exercises 1 Patient seen for reassessment of grip and pinch:  right grip 43#, left 35#.  Pinch lateral right 15#, left 12#.  2 point pinch 9#, left 9#.  3 point pinch right 13#, left 14#.  Patient seen for resistive putty exercises medium red for grip, lateral pinch, 3 point, 2 point.                  OT Education - 10/05/16 1500    Education provided  Yes   Education Details goals, updated POC, focus for coming sessions, hand strengthening.   Person(s) Educated Patient   Methods Explanation;Demonstration;Verbal cues   Comprehension Verbal cues required;Returned demonstration;Verbalized understanding             OT Long Term Goals - 10/05/16 1440      OT LONG TERM GOAL #1   Title Patient will demonstrate methods of picking up her bookbag and placing it onto the back of her chair with modified independence to transport items to class.   Baseline requires assistance from others to complete now but will need to be independent when at college in the fall .   Time 6   Period Months   Status Achieved     OT LONG TERM GOAL #2   Title Patient will be able to make her bed with modified independence.   Baseline she continues to have  difficulty with balance and completing this task, increased time.   Time 6   Period Months   Status On-going     OT LONG TERM GOAL #3   Title Patient will demonstrate the ability to perform vacuuming of one room with modified independence using light weight vacuum.   Baseline still requires max assist   Time 6   Period Months   Status On-going     OT LONG TERM GOAL #4   Title Patient will demonstrate the ability to open containers with sealed tops such as on yogurt containers and individual ice cream containers.    Baseline increased difficulty with sealed, peel away container tops and requires assistance or modifications to open   Time 12   Period Weeks   Status New     OT LONG TERM GOAL #5   Title Patient will demonstrate the ability to perform small buttons on shirts independently and with good speed in order to dress for school.   Baseline increased time and mom has to help 50% of the time and if she is under time constraints.    Time 12   Period Weeks   Status New     OT LONG TERM GOAL #6   Title Patient will donn and doff small earrings with modified independence.   Baseline max assist from mom, patient has difficulty holding and dropping with attempts.    Time 12   Period Weeks   Status New     OT LONG TERM GOAL  #9   Baseline Patient will be able to braid her own hair before college   Time 6   Period Months   Status Partially Met     OT LONG TERM GOAL  #10   TITLE --   Baseline --   Time 6   Period --   Status --     OT LONG TERM GOAL  #11   TITLE Will be able to squeeze tooth paste out of the tube when it is less than half full.   Baseline difficulty with small travel sized.   Time 6   Period Months   Status On-going     OT LONG TERM GOAL  #12   TITLE Patient will demonstrate managing wheelchair rain poncho in inclement weather with modified independence.   Baseline crutch tips are difficult for anyone to take them off, goal deferred.   Time 6    Period Months   Status Achieved     OT LONG TERM GOAL  #13   TITLE Patient will be able to complete 2  1/2 minutes straight with left shoulder flexion simulating driving with a stearing wheel ball to be able to drive to college.    Baseline Has not been able to drive in the last few months, mom or dad takes her to and from college each day.   Time 6   Period Months   Status On-going     OT LONG TERM GOAL  #14   TITLE --   Baseline --   Time --   Period --   Status --     OT LONG TERM GOAL  #15   TITLE Patient will be able to obtain food tray in the cafeteria from a wheelchair level and take to the table without spilling with modified independence.    Baseline max assist   Time 6   Period Months   Status Achieved               Plan - 10/05/16 1502    Clinical Impression Statement Patient has continued to progress in all areas, she has made a transition to college at [employer] which is near her home.  Mom drives her and picks her up, she has not been driving herself yet. She has made progress with being able to manage her bra, side braids, lifting her bookbag from the back of the wheelchair, managing her rain poncho, and managing a lunch tray at college.  She continues to need assistance with the following:  Patient continues to demonstrate difficulty with performing braiding of hair, she is able to do a side braid but unable to complete a braid at the back of the head.  She continues to demo difficulty with squeezing toothpaste out of the tube and requires min assist at times.  She has not been able to vacuum, remains max assist.  She is able to assist with bed making but has difficulty with moving around the bed and to manage with loft strand crutches while using either hand to manage bed linens.  She has increased difficulty with opening containers of yogurt and ice cream which have the sealed wrapper that requires pinch and to pull it off.  She requires max assist to don and  doff small earrings.  Small buttons are difficult and require increased time and mom has to perform 50% of the time and always under a time constraint.  Patient also requires assistance with pulling her jeans over her leg braces. She would continue to benefit from skilled OT to increase independence in the mentioned areas, will reduce frequency to 1x a week since she is in college and will work on targeted areas of limitations.    Rehab Potential Good   OT Frequency 1x / week   OT Duration 12 weeks   OT Treatment/Interventions Self-care/ADL training;Therapeutic exercise;Therapeutic exercises;Therapeutic activities;DME and/or AE instruction;Patient/family education   Consulted and Agree with Plan of Care Patient      Patient will benefit from skilled therapeutic intervention in order to improve the following deficits and impairments:  Impaired UE functional use, Difficulty walking, Decreased strength, Decreased coordination, Decreased endurance, Decreased balance  Visit Diagnosis: Muscle weakness (generalized) - Plan: Ot plan of care cert/re-cert  Other lack of coordination - Plan: Ot plan of care cert/re-cert    Problem List There are no active problems to display for this patient.  Achilles Dunk, OTR/L, CLT  Michelle Randall 10/05/2016, 5:16 PM  [place] MAIN [facility] SERVICES [address] [place], [place], [address] Phone: [phone]  Fax:  [phone]  Name: Michelle Randall MRN: [id number] Date of Birth: [birth year]

## 2016-10-05 NOTE — Therapy (Signed)
[place] [facility] [address] [place], [place], [address] Phone: [phone]   Fax:  [phone]  Physical Therapy Treatment  Patient Details  Name: Michelle Randall MRN: [id number] Date of Birth: 07-10-1996 No Data Recorded  Encounter Date: 10/05/2016      PT End of Session - 10/05/16 1524    Visit Number 61   Date for PT Re-Evaluation 08/09/16   Authorization Type medicaid   PT Start Time 0315   PT Stop Time 0400   PT Time Calculation (min) 45 min   Equipment Utilized During Treatment Gait belt   Activity Tolerance Patient tolerated treatment well   Behavior [facility] for tasks assessed/performed      No past medical history on file.  No past surgical history on file.  There were no vitals filed for this visit.      Subjective Assessment - 10/05/16 1523    Subjective Patient is doing well.      TM waking 1. 0 miles / hour for 10 minutes Leg press 100 lbs x 20 x 3  standing hip abd with YTB x 20   Prone planks and sidelying planks x 30 sec x 3. Prone stretch Patient needs occasional verbal cueing to improve posture and cueing to correctly perform exercises slowly, holding at end of range to increase motor firing of desired muscle to encourage fatigue.                            PT Education - 10/05/16 1523    Education provided No   Person(s) Educated Patient   Methods Explanation   Comprehension Verbalized understanding             PT Long Term Goals - 08/17/16 0942      PT LONG TERM GOAL #1   Title Patient will improve Dynamic Gait Index (DGI) score to > 19/24 for low falls risk regarding dynamic walking tasks    Baseline DGI 14/24   Time 12   Status On-going     PT LONG TERM GOAL #2   Title Patient will increase Berg Balance score by > 6 points to demonstrate decreased fall risk during functional activities    Time 12   Status On-going     PT LONG TERM  GOAL #3   Title Patient will be able to transfer in and out of a large car with Patient will be able to transfer in and out of a high seated car with CGA    Baseline Patient requires min for transfer into large car   Time 12   Status On-going     PT LONG TERM GOAL #4   Title Patient will complete a TUG test in < 12 seconds for independent mobility and decreased fall risk    Baseline 11.20 sec   Time 12   Period Weeks   Status On-going     PT LONG TERM GOAL #5   Title Patient will improve 6 minute walk distance by > 150 ft for improved return to functional community activities    Baseline 640 feet   Time 12   Status On-going     PT LONG TERM GOAL #6   Title Patient will improve gait speed to > 1.2 m/s with least restrictive assistive device to return to normal walking speed    Baseline .80 m/sec   Time 12   Period Weeks  Status On-going     PT LONG TERM GOAL #7   Title Patient (< 19 years old) will complete five times sit to stand test in < 10 seconds indicating an increased LE strength and improved balance    Time 10.56   Status On-going     PT LONG TERM GOAL #8   Title Patient will be able to ambulate on inclines and grass independenlty with LRAD   Baseline Patient needs SBA for ambulation in grass or ueven surfaces   Time 12   Period Weeks   Status On-going     PT LONG TERM GOAL  #9   TITLE Patient will be abe to transfer from low chair or stool wihtout UE support independently   Baseline Patient needs min asssit to transfer from a low stool   Time 12   Status On-going     PT LONG TERM GOAL  #10   TITLE Patient will be able to transfer from the floor to standing independently with use of LRAD   Baseline Patient needs min assist to transfer from the floor to standing   Time 12   Status On-going               Plan - 10/05/16 1524    Clinical Impression Statement Patient needs CGA for balance exercises and posture correction for exercises for trunk  extension.    Rehab Potential Good   Clinical Impairments Affecting Rehab Potential weakness and decreased standing balance   PT Frequency 1x / week   PT Duration 12 weeks   PT Treatment/Interventions Therapeutic exercise;Therapeutic activities;Gait training;Balance training;Stair training;DME Instruction;Neuromuscular re-education;Patient/family education   PT Next Visit Plan Bil knee flexion/extension strengthening; Progress LE, core strengthening and balance   PT Home Exercise Plan Continued from previous sessions.       Patient will benefit from skilled therapeutic intervention in order to improve the following deficits and impairments:  Abnormal gait, Decreased balance, Decreased endurance, Difficulty walking, Decreased strength  Visit Diagnosis: Muscle weakness (generalized)  Difficulty in walking, not elsewhere classified  Other lack of coordination     Problem List There are no active problems to display for this patient.  Ezekiel InaKristine S Tinley Rought, PT, DPT [place], Barkley BrunsKristine S 10/05/2016, 3:29 PM  [place] [facility] MAIN [facility] [address] [place], [place], [address] Phone: [phone]   Fax:  [phone]  Name: Michelle Randall MRN: [id number] Date of Birth: Jun 17, 1996

## 2016-10-12 ENCOUNTER — Encounter: Payer: Self-pay | Admitting: Occupational Therapy

## 2016-10-12 ENCOUNTER — Ambulatory Visit: Payer: Medicaid Other | Admitting: Occupational Therapy

## 2016-10-12 ENCOUNTER — Encounter: Payer: Self-pay | Admitting: Physical Therapy

## 2016-10-12 ENCOUNTER — Ambulatory Visit: Payer: Medicaid Other | Admitting: Physical Therapy

## 2016-10-12 DIAGNOSIS — M6281 Muscle weakness (generalized): Secondary | ICD-10-CM

## 2016-10-12 DIAGNOSIS — R278 Other lack of coordination: Secondary | ICD-10-CM

## 2016-10-12 DIAGNOSIS — R279 Unspecified lack of coordination: Secondary | ICD-10-CM

## 2016-10-12 DIAGNOSIS — R262 Difficulty in walking, not elsewhere classified: Secondary | ICD-10-CM

## 2016-10-12 NOTE — Therapy (Signed)
[place] MAIN [facility] SERVICES [address] [place], [place], [address] Phone: [phone]   Fax:  [phone]  Occupational Therapy Treatment  Patient Details  Name: Michelle Randall MRN: [id number] Date of Birth: November 14, 1996 No Data Recorded  Encounter Date: 10/12/2016      OT End of Session - 10/12/16 1535    Visit Number 44   Number of Visits 19   Date for OT Re-Evaluation 12/27/16   Authorization Type medicaid visit 55 of 21   OT Start Time 1432   OT Stop Time 1515   OT Time Calculation (min) 43 min   Activity Tolerance Patient tolerated treatment well   Behavior During Therapy [facility] for tasks assessed/performed      History reviewed. No pertinent past medical history.  History reviewed. No pertinent surgical history.  There were no vitals filed for this visit.      Subjective Assessment - 10/12/16 1439    Subjective  Patient reports she changed primary care providers last week from a pediatric doctor to regular, now seeing Lendon Colonel, at [facility].    Patient Stated Goals wants to be independent with all tasks so she will be able to go away to college.   Currently in Pain? No/denies   Pain Score 0-No pain                      OT Treatments/Exercises (OP) - 10/12/16 1529      Fine Motor Coordination   Other Fine Motor Exercises Patient seen for manipulation of small pieces of Purdue Pegboard with emphasis on prehension patterns, translatory skills of the hand and using the hand for storage, cues provided for technique.  Manipulation of 100 pegboard pegs, turning and flipping with cues for technique.      Neurological Re-education Exercises   Other Exercises 1 Patient seen for resistive pinch all levels from yellow to black, both right and left hands used with cues for pinch.  Reaching patterns with 2# weights for BUE strengthening in a variety of planes.                 OT Education -  10/12/16 1535    Education provided Yes   Education Details fine motor coordination, hand strength for functional tasks.    Person(s) Educated Patient   Methods Explanation;Demonstration;Verbal cues   Comprehension Verbal cues required;Returned demonstration;Verbalized understanding             OT Long Term Goals - 10/05/16 1440      OT LONG TERM GOAL #1   Title Patient will demonstrate methods of picking up her bookbag and placing it onto the back of her chair with modified independence to transport items to class.   Baseline requires assistance from others to complete now but will need to be independent when at college in the fall .   Time 6   Period Months   Status Achieved     OT LONG TERM GOAL #2   Title Patient will be able to make her bed with modified independence.   Baseline she continues to have difficulty with balance and completing this task, increased time.   Time 6   Period Months   Status On-going     OT LONG TERM GOAL #3   Title Patient will demonstrate the ability to perform vacuuming of one room with modified independence using light weight vacuum.   Baseline still requires max assist  Time 6   Period Months   Status On-going     OT LONG TERM GOAL #4   Title Patient will demonstrate the ability to open containers with sealed tops such as on yogurt containers and individual ice cream containers.    Baseline increased difficulty with sealed, peel away container tops and requires assistance or modifications to open   Time 12   Period Weeks   Status New     OT LONG TERM GOAL #5   Title Patient will demonstrate the ability to perform small buttons on shirts independently and with good speed in order to dress for school.   Baseline increased time and mom has to help 50% of the time and if she is under time constraints.    Time 12   Period Weeks   Status New     OT LONG TERM GOAL #6   Title Patient will donn and doff small earrings with modified  independence.   Baseline max assist from mom, patient has difficulty holding and dropping with attempts.    Time 12   Period Weeks   Status New     OT LONG TERM GOAL  #9   Baseline Patient will be able to braid her own hair before college   Time 6   Period Months   Status Partially Met     OT LONG TERM GOAL  #10   TITLE --   Baseline --   Time 6   Period --   Status --     OT LONG TERM GOAL  #11   TITLE Will be able to squeeze tooth paste out of the tube when it is less than half full.   Baseline difficulty with small travel sized.   Time 6   Period Months   Status On-going     OT LONG TERM GOAL  #12   TITLE Patient will demonstrate managing wheelchair rain poncho in inclement weather with modified independence.   Baseline crutch tips are difficult for anyone to take them off, goal deferred.   Time 6   Period Months   Status Achieved     OT LONG TERM GOAL  #13   TITLE Patient will be able to complete 2 1/2 minutes straight with left shoulder flexion simulating driving with a stearing wheel ball to be able to drive to college.    Baseline Has not been able to drive in the last few months, mom or dad takes her to and from college each day.   Time 6   Period Months   Status On-going     OT LONG TERM GOAL  #14   TITLE --   Baseline --   Time --   Period --   Status --     OT LONG TERM GOAL  #15   TITLE Patient will be able to obtain food tray in the cafeteria from a wheelchair level and take to the table without spilling with modified independence.    Baseline max assist   Time 6   Period Months   Status Achieved               Plan - 10/12/16 1536    Clinical Impression Statement Patient seen for bilateral hand stregnthening and coordination skills to work towards improved pinch for opening yogurt containers.  Patient able to complete reapeated trials of reaching with bilateral UEs with 2# wrist weights in a variety of planes of motion.  Will try to work  more towards pulling motions to improve ability to put on jeans over leg braces.  Continue towards updated goals.  Await approval from Medicaid for updated visits.    Rehab Potential Good   OT Frequency 1x / week   OT Duration 12 weeks   OT Treatment/Interventions Self-care/ADL training;Therapeutic exercise;Therapeutic exercises;Therapeutic activities;DME and/or AE instruction;Patient/family education      Patient will benefit from skilled therapeutic intervention in order to improve the following deficits and impairments:  Impaired UE functional use, Difficulty walking, Decreased strength, Decreased coordination, Decreased endurance, Decreased balance  Visit Diagnosis: Muscle weakness (generalized)  Other lack of coordination    Problem List There are no active problems to display for this patient.  Achilles Dunk, OTR/L, CLT  Tyhir Schwan 10/12/2016, 3:41 PM  [place] MAIN [facility] SERVICES [place], [place], [address] Phone: [phone]   Fax:  [phone]  Name: Michelle Randall MRN: [id number] Date of Birth: 1995-12-19

## 2016-10-12 NOTE — Therapy (Signed)
[place] [facility] [address] [place], [place], [address] Phone: [phone]   Fax:  [phone]  Physical Therapy Treatment  Patient Details  Name: Michelle Randall MRN: [id number] Date of Birth: June 05, 1996 No Data Recorded  Encounter Date: 10/12/2016      PT End of Session - 10/12/16 1534    Visit Number 62   Date for PT Re-Evaluation 08/09/16   Authorization Type medicaid   PT Start Time 1515   PT Stop Time 1600   PT Time Calculation (min) 45 min   Equipment Utilized During Treatment Gait belt   Activity Tolerance Patient tolerated treatment well   Behavior During Therapy [facility] for tasks assessed/performed      History reviewed. No pertinent past medical history.  History reviewed. No pertinent surgical history.  There were no vitals filed for this visit.      Subjective Assessment - 10/12/16 1533    Subjective Patient is doing well.   Patient is accompained by: Family member   Limitations Walking   Patient Stated Goals Patient wants to improve her core strength.    Currently in Pain? No/denies   Pain Score 0-No pain   Pain Onset In the past 7 days           OUTCOME MEASURES: TEST  Outcome  Interpretation   5 times sit<>stand  9.56sec  >60 yo, >15 sec indicates increased risk for falls   10 meter walk test    .76           m/s  <1.0 m/s indicates increased risk for falls; limited community ambulator   Timed up and Go    11.45            sec  <14 sec indicates increased risk for falls   6 minute walk test   780            Feet  1000 feet is community ambulator          TM walking x 10 mins for 1.0 miles/hourSide steps x 3 laps   Standing balance no UE 5x30s Side stepping left and right  X 10  Patient needs occasional verbal cueing to improve posture and cueing to correctly perform exercises slowly, holding at end of range to increase motor firing of desired muscle to encourage fatigue.                            PT Education - 10/12/16 1533    Education provided No   Education Details HEP   Person(s) Educated Patient   Methods Explanation   Comprehension Verbalized understanding             PT Long Term Goals - 10/12/16 1556      PT LONG TERM GOAL #1   Title Patient will improve Dynamic Gait Index (DGI) score to > 19/24 for low falls risk regarding dynamic walking tasks    Baseline 14/24   Period Weeks   Status On-going     PT LONG TERM GOAL #2   Title Patient will increase Berg Balance score by > 6 points to demonstrate decreased fall risk during functional activities    Time 12   Period Weeks   Status On-going     PT LONG TERM GOAL #3   Title Patient will be able to transfer in and out of a large car with Patient will be able to  transfer in and out of a high seated car with CGA    Baseline Patient requires min for transfer into large car   Time 12   Period Weeks   Status On-going     PT LONG TERM GOAL #4   Title Patient will complete a TUG test in < 12 seconds for independent mobility and decreased fall risk    Baseline 11.45   Period Weeks   Status Achieved     PT LONG TERM GOAL #5   Title Patient will improve 6 minute walk distance by > 150 ft for improved return to functional community activities    Baseline 740   Status On-going     PT LONG TERM GOAL #6   Title Patient will improve gait speed to > 1.2 m/s with least restrictive assistive device to return to normal walking speed    Baseline .76 m/h   Time 12   Period Weeks   Status On-going     PT LONG TERM GOAL #7   Title Patient (< 19 years old) will complete five times sit to stand test in < 10 seconds indicating an increased LE strength and improved balance    Baseline 9.45 sec   Period Weeks   Status Achieved     PT LONG TERM GOAL #8   Title Patient will be able to ambulate on inclines and grass independenlty with LRAD   Baseline Patient needs SBA for  ambulation in grass or ueven surfaces   Time 12   Period Weeks   Status On-going     PT LONG TERM GOAL  #9   TITLE Patient will be abe to transfer from low chair or stool wihtout UE support independently   Baseline Patient needs min asssit to transfer from a low stool   Period Weeks   Status On-going     PT LONG TERM GOAL  #10   TITLE Patient will be able to transfer from the floor to standing independently with use of LRAD   Baseline Patient needs min assist to transfer from the floor to standing   Time 12   Period Weeks   Status On-going               Plan - 10/12/16 1534    Clinical Impression Statement PT instructed Patient in standardized outcome measures to assess progress including the , 5x STS, 10 m walk test, 6 minute walk test. See above for results   Rehab Potential Good   Clinical Impairments Affecting Rehab Potential weakness and decreased standing balance   PT Frequency 1x / week   PT Duration 12 weeks   PT Treatment/Interventions Therapeutic exercise;Therapeutic activities;Gait training;Balance training;Stair training;DME Instruction;Neuromuscular re-education;Patient/family education   PT Next Visit Plan Bil knee flexion/extension strengthening; Progress LE, core strengthening and balance   PT Home Exercise Plan Continued from previous sessions.    Consulted and Agree with Plan of Care Patient      Patient will benefit from skilled therapeutic intervention in order to improve the following deficits and impairments:  Abnormal gait, Decreased balance, Decreased endurance, Difficulty walking, Decreased strength  Visit Diagnosis: Muscle weakness (generalized)  Difficulty in walking, not elsewhere classified  Lack of coordination     Problem List There are no active problems to display for this patient.  Ezekiel InaKristine S Lonn Im, PT, DPT [place], [place] 10/12/2016, 4:07 PM  [place] [facility] [address] [place], [place], [address] Phone: [phone]  Fax:  [phone]  Name: Michelle Randall MRN: [id number] Date of Birth: [birth year]

## 2016-10-19 ENCOUNTER — Ambulatory Visit: Payer: Medicaid Other

## 2016-10-19 ENCOUNTER — Ambulatory Visit: Payer: Medicaid Other | Admitting: Occupational Therapy

## 2016-10-26 ENCOUNTER — Ambulatory Visit: Payer: Medicaid Other | Admitting: Physical Therapy

## 2016-10-26 ENCOUNTER — Encounter: Payer: Self-pay | Admitting: Physical Therapy

## 2016-10-26 ENCOUNTER — Encounter: Payer: Self-pay | Admitting: Occupational Therapy

## 2016-10-26 ENCOUNTER — Ambulatory Visit: Payer: Medicaid Other | Attending: Pediatrics | Admitting: Occupational Therapy

## 2016-10-26 DIAGNOSIS — R278 Other lack of coordination: Secondary | ICD-10-CM

## 2016-10-26 DIAGNOSIS — M6281 Muscle weakness (generalized): Secondary | ICD-10-CM

## 2016-10-26 DIAGNOSIS — R262 Difficulty in walking, not elsewhere classified: Secondary | ICD-10-CM | POA: Insufficient documentation

## 2016-10-26 NOTE — Therapy (Signed)
[place] [facility] [address] [place], [place], [address] Phone: [phone]   Fax:  [phone]  Physical Therapy Treatment  Patient Details  Name: Michelle Randall MRN: [id number] Date of Birth: [birth year] No Data Recorded  Encounter Date: 10/26/2016      PT End of Session - 10/26/16 1526    Visit Number 63   Date for PT Re-Evaluation 08/09/16   Authorization Type medicaid   PT Start Time 1515   PT Stop Time 1600   PT Time Calculation (min) 45 min   Equipment Utilized During Treatment Gait belt   Activity Tolerance Patient tolerated treatment well   Behavior During Therapy [facility] for tasks assessed/performed      History reviewed. No pertinent past medical history.  History reviewed. No pertinent surgical history.  There were no vitals filed for this visit.      Subjective Assessment - 10/26/16 1524    Subjective Patient is doing well with no new problems.   Currently in Pain? No/denies   Pain Score 0-No pain       Therapeutic exercise: Quadriped hand and knees alternating UE extension x 5 sec x 20  Prone planks x 1 minute x 3 sets Side planks left and right x 1 minute x 3 sets  Prone knee flex stretch x 30 sec x 3 Leg press x 100 lbs x 20 x 3 Patient needs occasional verbal cueing to improve posture and cueing to correctly perform exercises slowly, holding at end of range to increase motor firing of desired muscle to encourage fatigue.                            PT Education - 10/26/16 1526    Education provided Yes   Education Details HEP   Person(s) Educated Patient   Methods Explanation   Comprehension Verbalized understanding             PT Long Term Goals - 10/12/16 1556      PT LONG TERM GOAL #1   Title Patient will improve Dynamic Gait Index (DGI) score to > 19/24 for low falls risk regarding dynamic walking tasks    Baseline 14/24   Period Weeks   Status On-going      PT LONG TERM GOAL #2   Title Patient will increase Berg Balance score by > 6 points to demonstrate decreased fall risk during functional activities    Time 12   Period Weeks   Status On-going     PT LONG TERM GOAL #3   Title Patient will be able to transfer in and out of a large car with Patient will be able to transfer in and out of a high seated car with CGA    Baseline Patient requires min for transfer into large car   Time 12   Period Weeks   Status On-going     PT LONG TERM GOAL #4   Title Patient will complete a TUG test in < 12 seconds for independent mobility and decreased fall risk    Baseline 11.45   Period Weeks   Status Achieved     PT LONG TERM GOAL #5   Title Patient will improve 6 minute walk distance by > 150 ft for improved return to functional community activities    Baseline 740   Status On-going     PT LONG TERM GOAL #6   Title Patient will  improve gait speed to > 1.2 m/s with least restrictive assistive device to return to normal walking speed    Baseline .76 m/h   Time 12   Period Weeks   Status On-going     PT LONG TERM GOAL #7   Title Patient (< 19 years old) will complete five times sit to stand test in < 10 seconds indicating an increased LE strength and improved balance    Baseline 9.45 sec   Period Weeks   Status Achieved     PT LONG TERM GOAL #8   Title Patient will be able to ambulate on inclines and grass independenlty with LRAD   Baseline Patient needs SBA for ambulation in grass or ueven surfaces   Time 12   Period Weeks   Status On-going     PT LONG TERM GOAL  #9   TITLE Patient will be abe to transfer from low chair or stool wihtout UE support independently   Baseline Patient needs min asssit to transfer from a low stool   Period Weeks   Status On-going     PT LONG TERM GOAL  #10   TITLE Patient will be able to transfer from the floor to standing independently with use of LRAD   Baseline Patient needs min assist to transfer  from the floor to standing   Time 12   Period Weeks   Status On-going               Plan - 10/26/16 1527    Clinical Impression Statement Patient is able to perform LE exercises and balance exercises and needs several rest periods . She will benefit from continued skilled PT to improve balance and gait.    Rehab Potential Good   Clinical Impairments Affecting Rehab Potential weakness and decreased standing balance   PT Frequency 1x / week   PT Duration 12 weeks   PT Treatment/Interventions Therapeutic exercise;Therapeutic activities;Gait training;Balance training;Stair training;DME Instruction;Neuromuscular re-education;Patient/family education   PT Next Visit Plan Bil knee flexion/extension strengthening; Progress LE, core strengthening and balance   PT Home Exercise Plan Continued from previous sessions.    Consulted and Agree with Plan of Care Patient      Patient will benefit from skilled therapeutic intervention in order to improve the following deficits and impairments:  Abnormal gait, Decreased balance, Decreased endurance, Difficulty walking, Decreased strength  Visit Diagnosis: Muscle weakness (generalized)  Difficulty in walking, not elsewhere classified     Problem List There are no active problems to display for this patient.  Ezekiel InaKristine S [name], PT, DPT [place], [place] Br[place]ristine S 10/26/2016, 3:40 PM  [place] [facility] [address] [place], [place], [address] Phone: [phone]   Fax:  [phone]  Name: Michelle Randall MRN: [id number] Date of Birth: 1996-02-16

## 2016-10-29 NOTE — Therapy (Signed)
[place] MAIN [facility] SERVICES [address] [place], [place], [address] Phone: [phone]   Fax:  [phone]  Occupational Therapy Treatment  Patient Details  Name: Michelle Randall MRN: [id number] Date of Birth: May 26, 1996 No Data Recorded  Encounter Date: 10/26/2016      OT End of Session - 10/29/16 1519    Visit Number 19   Number of Visits 63   Date for OT Re-Evaluation 12/27/16   Authorization Type medicaid visit 70 of 65   OT Start Time 1430   OT Stop Time 1515   OT Time Calculation (min) 45 min   Activity Tolerance Patient tolerated treatment well   Behavior During Therapy [facility] for tasks assessed/performed      History reviewed. No pertinent past medical history.  History reviewed. No pertinent surgical history.  There were no vitals filed for this visit.      Subjective Assessment - 10/29/16 1513    Subjective  Patient reports she had her last exam this morning and is going to hang out with friends this evening to celebrate with brownies and a movie.   Patient is accompained by: Family member   Currently in Pain? No/denies   Pain Score 0-No pain                      OT Treatments/Exercises (OP) - 10/29/16 1513      Fine Motor Coordination   Other Fine Motor Exercises Patient seen for manipulation of glass beads to pick up from tabletop, use translatory skills of the hand with cues and use the hand for storage.       Neurological Re-education Exercises   Other Exercises 1 Patient was seen for grip strength tasks for sustained grip using hand gripper on 4th setting for one set of 25 reps, 3rd setting 25 reps, 2nd setting 25 reps, right and left hands.  resistive push pins for bulletin board to complete 25 reps to place and remove with cues, board in upright position and then on the tabletop flat.  Cues provided for technique, weight bearing at times between to reinforce finger extension.                  OT Education - 10/29/16 1518    Education provided Yes   Education Details HEP for strength and coordination.   Person(s) Educated Patient   Methods Explanation;Verbal cues;Demonstration   Comprehension Verbalized understanding;Returned demonstration;Tactile cues required             OT Long Term Goals - 10/05/16 1440      OT LONG TERM GOAL #1   Title Patient will demonstrate methods of picking up her bookbag and placing it onto the back of her chair with modified independence to transport items to class.   Baseline requires assistance from others to complete now but will need to be independent when at college in the fall .   Time 6   Period Months   Status Achieved     OT LONG TERM GOAL #2   Title Patient will be able to make her bed with modified independence.   Baseline she continues to have difficulty with balance and completing this task, increased time.   Time 6   Period Months   Status On-going     OT LONG TERM GOAL #3   Title Patient will demonstrate the ability to perform vacuuming of one room with modified independence using light weight vacuum.  Baseline still requires max assist   Time 6   Period Months   Status On-going     OT LONG TERM GOAL #4   Title Patient will demonstrate the ability to open containers with sealed tops such as on yogurt containers and individual ice cream containers.    Baseline increased difficulty with sealed, peel away container tops and requires assistance or modifications to open   Time 12   Period Weeks   Status New     OT LONG TERM GOAL #5   Title Patient will demonstrate the ability to perform small buttons on shirts independently and with good speed in order to dress for school.   Baseline increased time and mom has to help 50% of the time and if she is under time constraints.    Time 12   Period Weeks   Status New     OT LONG TERM GOAL #6   Title Patient will donn and doff small earrings with  modified independence.   Baseline max assist from mom, patient has difficulty holding and dropping with attempts.    Time 12   Period Weeks   Status New     OT LONG TERM GOAL  #9   Baseline Patient will be able to braid her own hair before college   Time 6   Period Months   Status Partially Met     OT LONG TERM GOAL  #10   TITLE --   Baseline --   Time 6   Period --   Status --     OT LONG TERM GOAL  #11   TITLE Will be able to squeeze tooth paste out of the tube when it is less than half full.   Baseline difficulty with small travel sized.   Time 6   Period Months   Status On-going     OT LONG TERM GOAL  #12   TITLE Patient will demonstrate managing wheelchair rain poncho in inclement weather with modified independence.   Baseline crutch tips are difficult for anyone to take them off, goal deferred.   Time 6   Period Months   Status Achieved     OT LONG TERM GOAL  #13   TITLE Patient will be able to complete 2 1/2 minutes straight with left shoulder flexion simulating driving with a stearing wheel ball to be able to drive to college.    Baseline Has not been able to drive in the last few months, mom or dad takes her to and from college each day.   Time 6   Period Months   Status On-going     OT LONG TERM GOAL  #14   TITLE --   Baseline --   Time --   Period --   Status --     OT LONG TERM GOAL  #15   TITLE Patient will be able to obtain food tray in the cafeteria from a wheelchair level and take to the table without spilling with modified independence.    Baseline max assist   Time 6   Period Months   Status Achieved               Plan - 10/29/16 1519    Clinical Impression Statement Patient reports she received new leg braces however she cannot get her pants over the braces.  She is going today to see if there is anyway to alter the braces or make them smaller, otherwise she will have to  get new pants and will continue to have to have help to pull  them over her pants with dressing.  Will see what modifications are made and will address the issue further in coming sessions. She continues to work towards improvements in strength to manage her pants as well as hand skills to improve coordination for daily tasks.    Rehab Potential Good   OT Frequency 1x / week   OT Duration 12 weeks   OT Treatment/Interventions Self-care/ADL training;Therapeutic exercise;Therapeutic exercises;Therapeutic activities;DME and/or AE instruction;Patient/family education   Consulted and Agree with Plan of Care Patient      Patient will benefit from skilled therapeutic intervention in order to improve the following deficits and impairments:  Impaired UE functional use, Difficulty walking, Decreased strength, Decreased coordination, Decreased endurance, Decreased balance  Visit Diagnosis: Muscle weakness (generalized)  Other lack of coordination    Problem List There are no active problems to display for this patient.  Achilles Dunk, OTR/L, CLT  Judithe Keetch 10/29/2016, 3:22 PM  [place] MAIN [facility] SERVICES [address] [place], [place], [address] Phone: [phone]   Fax:  [phone]  Name: Michelle Randall MRN: [id number] Date of Birth: Nov 20, 1995

## 2016-11-02 ENCOUNTER — Encounter: Payer: Self-pay | Admitting: Occupational Therapy

## 2016-11-02 ENCOUNTER — Ambulatory Visit: Payer: Medicaid Other | Admitting: Occupational Therapy

## 2016-11-02 ENCOUNTER — Ambulatory Visit: Payer: Medicaid Other

## 2016-11-02 DIAGNOSIS — M6281 Muscle weakness (generalized): Secondary | ICD-10-CM

## 2016-11-02 DIAGNOSIS — R278 Other lack of coordination: Secondary | ICD-10-CM

## 2016-11-02 NOTE — Therapy (Signed)
[place] [facility] MAIN [facility] SERVICES [address] [place], [place], [address] Phone: [phone]   Fax:  [phone]  Physical Therapy Treatment  Patient Details  Name: Michelle Randall MRN: [id number] Date of Birth: 30-May-1996 No Data Recorded  Encounter Date: 11/02/2016      PT End of Session - 11/02/16 1523    Visit Number 64   Date for PT Re-Evaluation 01/25/17   Authorization Type medicaid   PT Start Time 1520   PT Stop Time 1610   PT Time Calculation (min) 50 min   Equipment Utilized During Treatment Gait belt   Activity Tolerance Patient tolerated treatment well   Behavior During Therapy [facility] for tasks assessed/performed      History reviewed. No pertinent past medical history.  History reviewed. No pertinent surgical history.  There were no vitals filed for this visit.      Subjective Assessment - 11/02/16 1523    Subjective Patient is doing well at this time. Reports that she finished her first semester at [employer] and did well with all her classes. She is performing HEP without issue. No specific questions or concerns at this time.   Patient is accompained by: Family member   Limitations Walking   Patient Stated Goals Patient wants to improve her core strength.    Currently in Pain? No/denies         TREATMENT  NME: Performed DGI and BERG with patient and updated goals;  Therapeutic Exercise Prone plank x 1 minute; Side planks left and right x 1 minute each; Quadriped hand and knees alternating UE extension x 10 each; Quadriped partial fire hydrants with weight shifting between LEs x 10 each; Prone knee flex stretch bilateral 30 sec x 3 Leg press x 105#, 120# x 10, 135# x 10;  Patient needs occasional verbal cueing to improve posture and cueing to correctly perform exercises slowly, holding at end of range to increase motor firing of desired muscle to encourage fatigue.                           PT Education - 11/02/16 1523    Education provided Yes   Education Details Reinforced HEP, update goals, plan of care   Person(s) Educated Patient   Methods Explanation   Comprehension Verbalized understanding             PT Long Term Goals - 11/02/16 1525      PT LONG TERM GOAL #1   Title Patient will improve Dynamic Gait Index (DGI) score to > 19/24 for low falls risk regarding dynamic walking tasks    Baseline 14/24; 11/02/16: 15/24   Time 12   Period Weeks   Status On-going     PT LONG TERM GOAL #2   Title Patient will increase Berg Balance score by > 6 points to demonstrate decreased fall risk during functional activities    Baseline 11/02/16: 34/56   Time 12   Period Weeks   Status On-going     PT LONG TERM GOAL #3   Title Patient will be able to transfer in and out of a large car with Patient will be able to transfer in and out of a high seated car with CGA    Baseline Patient requires min for transfer into large car   Time 12   Period Weeks   Status On-going     PT LONG TERM GOAL #4   Title  Patient will complete a TUG test in < 12 seconds for independent mobility and decreased fall risk    Baseline 11.45   Time 12   Period Weeks   Status Achieved     PT LONG TERM GOAL #5   Title Patient will improve 6 minute walk distance by > 150 ft for improved return to functional community activities    Baseline 740   Time 12   Status On-going     PT LONG TERM GOAL #6   Title Patient will improve gait speed to > 1.2 m/s with least restrictive assistive device to return to normal walking speed    Baseline .76 m/h   Time 12   Period Weeks   Status On-going     PT LONG TERM GOAL #7   Title Patient (< 19 years old) will complete five times sit to stand test in < 10 seconds indicating an increased LE strength and improved balance    Baseline 9.45 sec   Time 12   Period Weeks   Status Achieved     PT LONG TERM GOAL #8   Title Patient will be able to ambulate on  inclines and grass independenlty with LRAD   Baseline Patient needs SBA for ambulation in grass or ueven surfaces   Time 12   Period Weeks   Status On-going     PT LONG TERM GOAL  #9   TITLE Patient will be abe to transfer from low chair or stool wihtout UE support independently   Baseline Patient needs min asssit to transfer from a low stool   Period Weeks   Status Achieved     PT LONG TERM GOAL  #10   TITLE Patient will be able to transfer from the floor to standing independently with use of LRAD   Baseline Patient needs min assist to transfer from the floor to standing   Time 12   Period Weeks   Status On-going               Plan - 11/02/16 1525    Clinical Impression Statement Pt demonstrates improvement with sit to stand transfers without UE support on this date. DGI is 15/24 which is close to her baseline of 14/24. Pt scored a 34/56 on BERG today demonstrate most difficulty with single leg stance both due to balance strength deficits. Pt will continue to benefit from skilled PT services to work on balance and strength in order to maintain her function at home and school and decrease her risk for falls.     Rehab Potential Good   Clinical Impairments Affecting Rehab Potential weakness and decreased standing balance   PT Frequency 1x / week   PT Duration 12 weeks   PT Treatment/Interventions Therapeutic exercise;Therapeutic activities;Gait training;Balance training;Stair training;DME Instruction;Neuromuscular re-education;Patient/family education   PT Next Visit Plan Bil knee flexion/extension strengthening; Progress LE, core strengthening and balance   PT Home Exercise Plan Continued from previous sessions.    Consulted and Agree with Plan of Care Patient      Patient will benefit from skilled therapeutic intervention in order to improve the following deficits and impairments:  Abnormal gait, Decreased balance, Decreased endurance, Difficulty walking, Decreased  strength  Visit Diagnosis: Muscle weakness (generalized) - Plan: PT plan of care cert/re-cert     Problem List There are no active problems to display for this patient.  Lynnea MaizesJason D Carrieann Spielberg PT, DPT   Iyad Deroo 11/02/2016, 4:46 PM  [place] [facility]  MAIN [facility] [address] [place], [place], [address] Phone: [phone]   Fax:  [phone]  Name: Michelle Randall MRN: [id number] Date of Birth: 1996/01/31

## 2016-11-03 NOTE — Therapy (Signed)
[place] MAIN [facility] SERVICES [address] [place], [place], [address] Phone: [phone]   Fax:  [phone]  Occupational Therapy Treatment  Patient Details  Name: Michelle Randall MRN: [id number] Date of Birth: [birth year] No Data Recorded  Encounter Date: 11/02/2016      OT End of Session - 11/02/16 1458    Visit Number 56   Number of Visits 24   Date for OT Re-Evaluation 12/27/16   Authorization Type medicaid visit 41 of 35   OT Start Time 1437   OT Stop Time 1515   OT Time Calculation (min) 38 min   Activity Tolerance Patient tolerated treatment well   Behavior During Therapy [facility] for tasks assessed/performed      History reviewed. No pertinent past medical history.  History reviewed. No pertinent surgical history.  There were no vitals filed for this visit.      Subjective Assessment - 11/02/16 1457    Subjective  Patient reports she went to her sister's graduation for PT school this past weekend and they also had a surprise party for her sister.    Patient is accompained by: Family member   Patient Stated Goals wants to be independent with all tasks so she will be able to go away to college.   Currently in Pain? No/denies   Pain Score 0-No pain                      OT Treatments/Exercises (OP) - 11/02/16 1500      Fine Motor Coordination   Other Fine Motor Exercises Patient seen for right handed coordination exercises with toothpicks, manipulation from table to place into small holed container.  Cues for prehension patterns.      Neurological Re-education Exercises   Other Exercises 1 Patient seen for theraband exercises for BUE strengthening with red theraband for 12 reps for 2 sets for shoulder flexion, ABD/ADD, diagonal patterns, elbow flexion.  Shape tower in sitting with 4 graduated levels with 2# wrist weight for 11 shapes 4 levels, across and back (88 reps) Rest breaks as needed, cues for form and  technique.                OT Education - 11/02/16 1458    Education provided Yes   Education Details Reaching tasks with resistance, theraband exercises.   Person(s) Educated Patient   Methods Explanation;Demonstration;Verbal cues   Comprehension Verbal cues required;Returned demonstration;Verbalized understanding             OT Long Term Goals - 10/05/16 1440      OT LONG TERM GOAL #1   Title Patient will demonstrate methods of picking up her bookbag and placing it onto the back of her chair with modified independence to transport items to class.   Baseline requires assistance from others to complete now but will need to be independent when at college in the fall .   Time 6   Period Months   Status Achieved     OT LONG TERM GOAL #2   Title Patient will be able to make her bed with modified independence.   Baseline she continues to have difficulty with balance and completing this task, increased time.   Time 6   Period Months   Status On-going     OT LONG TERM GOAL #3   Title Patient will demonstrate the ability to perform vacuuming of one room with modified independence using light weight vacuum.  Baseline still requires max assist   Time 6   Period Months   Status On-going     OT LONG TERM GOAL #4   Title Patient will demonstrate the ability to open containers with sealed tops such as on yogurt containers and individual ice cream containers.    Baseline increased difficulty with sealed, peel away container tops and requires assistance or modifications to open   Time 12   Period Weeks   Status New     OT LONG TERM GOAL #5   Title Patient will demonstrate the ability to perform small buttons on shirts independently and with good speed in order to dress for school.   Baseline increased time and mom has to help 50% of the time and if she is under time constraints.    Time 12   Period Weeks   Status New     OT LONG TERM GOAL #6   Title Patient will donn  and doff small earrings with modified independence.   Baseline max assist from mom, patient has difficulty holding and dropping with attempts.    Time 12   Period Weeks   Status New     OT LONG TERM GOAL  #9   Baseline Patient will be able to braid her own hair before college   Time 6   Period Months   Status Partially Met     OT LONG TERM GOAL  #10   TITLE --   Baseline --   Time 6   Period --   Status --     OT LONG TERM GOAL  #11   TITLE Will be able to squeeze tooth paste out of the tube when it is less than half full.   Baseline difficulty with small travel sized.   Time 6   Period Months   Status On-going     OT LONG TERM GOAL  #12   TITLE Patient will demonstrate managing wheelchair rain poncho in inclement weather with modified independence.   Baseline crutch tips are difficult for anyone to take them off, goal deferred.   Time 6   Period Months   Status Achieved     OT LONG TERM GOAL  #13   TITLE Patient will be able to complete 2 1/2 minutes straight with left shoulder flexion simulating driving with a stearing wheel ball to be able to drive to college.    Baseline Has not been able to drive in the last few months, mom or dad takes her to and from college each day.   Time 6   Period Months   Status On-going     OT LONG TERM GOAL  #14   TITLE --   Baseline --   Time --   Period --   Status --     OT LONG TERM GOAL  #15   TITLE Patient will be able to obtain food tray in the cafeteria from a wheelchair level and take to the table without spilling with modified independence.    Baseline max assist   Time 6   Period Months   Status Achieved               Plan - 11/02/16 1459    Clinical Impression Statement Patient continues to progress with fine motor coordination tasks, working more towards speed and dexterity of hands to improve use in daily tasks.  Patient able to utilize 2# weight this date for reaching tasks to work towards improving  strength.  Continue to work towards goals to increase independence.    Rehab Potential Good   OT Frequency 1x / week   OT Duration 12 weeks   OT Treatment/Interventions Self-care/ADL training;Therapeutic exercise;Therapeutic exercises;Therapeutic activities;DME and/or AE instruction;Patient/family education   Consulted and Agree with Plan of Care Patient      Patient will benefit from skilled therapeutic intervention in order to improve the following deficits and impairments:  Impaired UE functional use, Difficulty walking, Decreased strength, Decreased coordination, Decreased endurance, Decreased balance  Visit Diagnosis: Muscle weakness (generalized)  Other lack of coordination    Problem List There are no active problems to display for this patient.  Achilles Dunk, OTR/L, CLT  Lovett,Amy 11/03/2016, 1:38 PM  [place] MAIN [facility] SERVICES [address] [place], [place], [address] Phone: [phone]   Fax:  [phone]  Name: Michelle Randall MRN: [id number] Date of Birth: 09/30/1996

## 2016-11-10 ENCOUNTER — Ambulatory Visit: Payer: Medicaid Other | Admitting: Occupational Therapy

## 2016-11-10 ENCOUNTER — Encounter: Payer: Self-pay | Admitting: Physical Therapy

## 2016-11-10 ENCOUNTER — Ambulatory Visit: Payer: Medicaid Other

## 2016-11-10 ENCOUNTER — Encounter: Payer: Self-pay | Admitting: Occupational Therapy

## 2016-11-10 DIAGNOSIS — M6281 Muscle weakness (generalized): Secondary | ICD-10-CM | POA: Diagnosis not present

## 2016-11-10 DIAGNOSIS — R278 Other lack of coordination: Secondary | ICD-10-CM

## 2016-11-10 NOTE — Therapy (Signed)
[place] [facility] [address] [place], [place], [address] Phone: [phone]   Fax:  [phone]  Physical Therapy Treatment  Patient Details  Name: Michelle Randall MRN: [id number] Date of Birth: 27-Nov-1995 No Data Recorded  Encounter Date: 11/10/2016      PT End of Session - 11/10/16 0937    Visit Number 65   Date for PT Re-Evaluation 01/25/17   Authorization Type medicaid   PT Start Time 0930   PT Stop Time 1015   PT Time Calculation (min) 45 min   Equipment Utilized During Treatment Gait belt   Activity Tolerance Patient tolerated treatment well   Behavior [facility] Therapy [facility] for tasks assessed/performed      History reviewed. No pertinent past medical history.  History reviewed. No pertinent surgical history.  There were no vitals filed for this visit.      Subjective Assessment - 11/10/16 0935    Subjective Pt states that she is doing well at this time. Performing HEP and denies pain on this date. No specific questions/concerns currently.    Patient is accompained by: Family member   Limitations Walking   Patient Stated Goals Patient wants to improve her core strength.    Currently in Pain? No/denies         TREATMENT   Therapeutic Exercise Side planks left and right 1 minute x 2 each; Hooklying SLR 2 x 10 bilateral; Hooklying bridges 2 x 10; Manually resisted shoulder to opposite hip for oblique activation to assist with rolling patterns 3s hold 2 x 10 bilaterally; Sidelying lateral trunk flexion, forming a C with opening down toward table, for oblique activation x 10 bilateral; Sidelying hip adductor lift x 10 bilateral; Prone plank 1 minute x 2; Quadriped hand and knees alternating UE extension x 10 each; Tall kneeling balance 30 seconds x 2; Tall kneeling hip thrusts rocking back toward heels and then moving into tall kneeling 2 x 10; Tall kneeling balance with external perturbations 30  seconds x 2; Quadriped partial fire hydrants with weight shifting between LEs x 10 each; Prone hip abduction with gravity minimized, heavy support to prevent lateral trunk flexion and frontal plane pelvic motion x 10 bilateral; Sit to stand without UE support 2 x 10, assist provided to keep knees apart;  Patient needs occasional verbal cueing to improve posture and cueing to correctly perform exercises slowly, holding at end of range to increase motor firing of desired muscle to encourage fatigue                         PT Education - 11/10/16 0937    Education provided Yes   Education Details Reinforced HEP   Person(s) Educated Patient   Methods Explanation   Comprehension Verbalized understanding             PT Long Term Goals - 11/02/16 1525      PT LONG TERM GOAL #1   Title Patient will improve Dynamic Gait Index (DGI) score to > 19/24 for low falls risk regarding dynamic walking tasks    Baseline 14/24; 11/02/16: 15/24   Time 12   Period Weeks   Status On-going     PT LONG TERM GOAL #2   Title Patient will increase Berg Balance score by > 6 points to demonstrate decreased fall risk during functional activities    Baseline 11/02/16: 34/56   Time 12   Period Weeks   Status  On-going     PT LONG TERM GOAL #3   Title Patient will be able to transfer in and out of a large car with Patient will be able to transfer in and out of a high seated car with CGA    Baseline Patient requires min for transfer into large car   Time 12   Period Weeks   Status On-going     PT LONG TERM GOAL #4   Title Patient will complete a TUG test in < 12 seconds for independent mobility and decreased fall risk    Baseline 11.45   Time 12   Period Weeks   Status Achieved     PT LONG TERM GOAL #5   Title Patient will improve 6 minute walk distance by > 150 ft for improved return to functional community activities    Baseline 740   Time 12   Status On-going     PT LONG  TERM GOAL #6   Title Patient will improve gait speed to > 1.2 m/s with least restrictive assistive device to return to normal walking speed    Baseline .76 m/h   Time 12   Period Weeks   Status On-going     PT LONG TERM GOAL #7   Title Patient (< 53 years old) will complete five times sit to stand test in < 10 seconds indicating an increased LE strength and improved balance    Baseline 9.45 sec   Time 12   Period Weeks   Status Achieved     PT LONG TERM GOAL #8   Title Patient will be able to ambulate on inclines and grass independenlty with LRAD   Baseline Patient needs SBA for ambulation in grass or ueven surfaces   Time 12   Period Weeks   Status On-going     PT LONG TERM GOAL  #9   TITLE Patient will be abe to transfer from low chair or stool wihtout UE support independently   Baseline Patient needs min asssit to transfer from a low stool   Period Weeks   Status Achieved     PT LONG TERM GOAL  #10   TITLE Patient will be able to transfer from the floor to standing independently with use of LRAD   Baseline Patient needs min assist to transfer from the floor to standing   Time 12   Period Weeks   Status On-going               Plan - 11/10/16 0937    Clinical Impression Statement Pt demonstrates good endurance today with side and front planks. She requires some correction of form/technique throughout session to avoid compensatory strategies. Pt encouraged to continue HEP and follow-up as scheduled.    Rehab Potential Good   Clinical Impairments Affecting Rehab Potential weakness and decreased standing balance   PT Frequency 1x / week   PT Duration 12 weeks   PT Treatment/Interventions Therapeutic exercise;Therapeutic activities;Gait training;Balance training;Stair training;DME Instruction;Neuromuscular re-education;Patient/family education   PT Next Visit Plan Bil knee flexion/extension strengthening; Progress LE, core strengthening and balance   PT Home Exercise  Plan Continued from previous sessions.    Consulted and Agree with Plan of Care Patient      Patient will benefit from skilled therapeutic intervention in order to improve the following deficits and impairments:  Abnormal gait, Decreased balance, Decreased endurance, Difficulty walking, Decreased strength  Visit Diagnosis: Muscle weakness (generalized)  Other lack of coordination  Problem List There are no active problems to display for this patient.  Lynnea MaizesJason D Zymiere Trostle PT, DPT   Klaudia Beirne 11/10/2016, 11:32 AM  [place] [facility] [address] [place], [place], [address] Phone: [phone]   Fax:  [phone]  Name: Michelle Randall MRN: [id number] Date of Birth: 1996/07/07

## 2016-11-12 NOTE — Therapy (Signed)
[place] MAIN [facility] SERVICES [address] [place], [place], [address] Phone: [phone]   Fax:  [phone]  Occupational Therapy Treatment  Patient Details  Name: Michelle Randall MRN: [id number] Date of Birth: [birth year] No Data Recorded  Encounter Date: 11/10/2016      OT End of Session - 11/12/16 1749    Visit Number 24   Number of Visits 76   Date for OT Re-Evaluation 12/27/16   Authorization Type medicaid visit 65 of 65   OT Start Time 1016   OT Stop Time 1100   OT Time Calculation (min) 44 min   Activity Tolerance Patient tolerated treatment well   Behavior During Therapy [facility] for tasks assessed/performed      History reviewed. No pertinent past medical history.  History reviewed. No pertinent surgical history.  There were no vitals filed for this visit.      Subjective Assessment - 11/12/16 1747    Subjective  Patient reports she is getting bored at home and looking forwards to going back to school for Winter session.  Starts back on November 17, 2016.   Patient Stated Goals wants to be independent with all tasks so she will be able to go away to college.   Currently in Pain? No/denies   Pain Score 0-No pain                      OT Treatments/Exercises (OP) - 11/12/16 1747      Fine Motor Coordination   Other Fine Motor Exercises Patient seen for manipulation of small 1/2 inch washers and dowels with reaching patterns combined, place and remove from 3 alternating dowels one vertical and two diagonal patterns.  Cues for prehension patterns and using the hand for storage with washers.  Manipulation of small open beads with right hand left hand for fine motor coordination and removing with forcept scissors with use of thumb and middle finger with cues for technique. Advanced to picking up with foreceps and placing onto small micro board, cues for using tool.     Neurological Re-education Exercises   Other  Exercises 1 Patient seen for strengthening exercises in sitting 2# weight for shoulder flexion, chest press, elbow flexion/extension and supination of forearm for 3 sets of 10 reps each, cues for form and technique. Resistive gripping exercises with red putty for gross grip each hand, 2 point pinch and 3 point pinch for multiple reps for 2 sets each.                 OT Education - 11/12/16 1748    Education provided Yes   Education Details fine motor coordination techniques to assist with manipulation of small objects at home.    Person(s) Educated Patient   Methods Explanation;Demonstration;Verbal cues   Comprehension Verbal cues required;Verbalized understanding;Returned demonstration             OT Long Term Goals - 10/05/16 1440      OT LONG TERM GOAL #1   Title Patient will demonstrate methods of picking up her bookbag and placing it onto the back of her chair with modified independence to transport items to class.   Baseline requires assistance from others to complete now but will need to be independent when at college in the fall .   Time 6   Period Months   Status Achieved     OT LONG TERM GOAL #2   Title Patient will be able to  make her bed with modified independence.   Baseline she continues to have difficulty with balance and completing this task, increased time.   Time 6   Period Months   Status On-going     OT LONG TERM GOAL #3   Title Patient will demonstrate the ability to perform vacuuming of one room with modified independence using light weight vacuum.   Baseline still requires max assist   Time 6   Period Months   Status On-going     OT LONG TERM GOAL #4   Title Patient will demonstrate the ability to open containers with sealed tops such as on yogurt containers and individual ice cream containers.    Baseline increased difficulty with sealed, peel away container tops and requires assistance or modifications to open   Time 12   Period Weeks    Status New     OT LONG TERM GOAL #5   Title Patient will demonstrate the ability to perform small buttons on shirts independently and with good speed in order to dress for school.   Baseline increased time and mom has to help 50% of the time and if she is under time constraints.    Time 12   Period Weeks   Status New     OT LONG TERM GOAL #6   Title Patient will donn and doff small earrings with modified independence.   Baseline max assist from mom, patient has difficulty holding and dropping with attempts.    Time 12   Period Weeks   Status New     OT LONG TERM GOAL  #9   Baseline Patient will be able to braid her own hair before college   Time 6   Period Months   Status Partially Met     OT LONG TERM GOAL  #10   TITLE --   Baseline --   Time 6   Period --   Status --     OT LONG TERM GOAL  #11   TITLE Will be able to squeeze tooth paste out of the tube when it is less than half full.   Baseline difficulty with small travel sized.   Time 6   Period Months   Status On-going     OT LONG TERM GOAL  #12   TITLE Patient will demonstrate managing wheelchair rain poncho in inclement weather with modified independence.   Baseline crutch tips are difficult for anyone to take them off, goal deferred.   Time 6   Period Months   Status Achieved     OT LONG TERM GOAL  #13   TITLE Patient will be able to complete 2 1/2 minutes straight with left shoulder flexion simulating driving with a stearing wheel ball to be able to drive to college.    Baseline Has not been able to drive in the last few months, mom or dad takes her to and from college each day.   Time 6   Period Months   Status On-going     OT LONG TERM GOAL  #14   TITLE --   Baseline --   Time --   Period --   Status --     OT LONG TERM GOAL  #15   TITLE Patient will be able to obtain food tray in the cafeteria from a wheelchair level and take to the table without spilling with modified independence.    Baseline  max assist   Time 6   Period Months  Status Achieved               Plan - 11/12/16 1749    Clinical Impression Statement Patient working towards manipulation of smaller objects less than 1/2 inch in size, some difficulty noted with using scissor forcepts to pick up items to place onto board.  Strength continues to improve with being able to  manage school supplies. Continue to work towards goals in [place].   Rehab Potential Good   OT Frequency 1x / week   OT Duration 12 weeks   OT Treatment/Interventions Self-care/ADL training;Therapeutic exercise;Therapeutic exercises;Therapeutic activities;DME and/or AE instruction;Patient/family education   Consulted and Agree with Plan of Care Patient      Patient will benefit from skilled therapeutic intervention in order to improve the following deficits and impairments:  Impaired UE functional use, Difficulty walking, Decreased strength, Decreased coordination, Decreased endurance, Decreased balance  Visit Diagnosis: Muscle weakness (generalized)  Other lack of coordination    Problem List There are no active problems to display for this patient.  Achilles Dunk, OTR/L, CLT  Lovett,Amy 11/12/2016, 5:54 PM  [place] MAIN [facility] SERVICES [address] [place], [place], [address] Phone: [phone]   Fax:  [phone]  Name: KEILA TURAN MRN: [id number] Date of Birth: [birth year]

## 2016-11-16 ENCOUNTER — Ambulatory Visit: Payer: Medicaid Other | Admitting: Physical Therapy

## 2016-11-16 ENCOUNTER — Encounter: Payer: Self-pay | Admitting: Occupational Therapy

## 2016-11-16 ENCOUNTER — Encounter: Payer: Self-pay | Admitting: Physical Therapy

## 2016-11-16 ENCOUNTER — Ambulatory Visit: Payer: Medicaid Other | Attending: Pediatrics | Admitting: Occupational Therapy

## 2016-11-16 DIAGNOSIS — R278 Other lack of coordination: Secondary | ICD-10-CM | POA: Diagnosis present

## 2016-11-16 DIAGNOSIS — R262 Difficulty in walking, not elsewhere classified: Secondary | ICD-10-CM | POA: Insufficient documentation

## 2016-11-16 DIAGNOSIS — M6281 Muscle weakness (generalized): Secondary | ICD-10-CM | POA: Diagnosis present

## 2016-11-16 NOTE — Therapy (Addendum)
[place] [facility] MAIN [facility] SERVICES [address] [place], [place], [address] Phone: [phone]   Fax:  [phone]  Physical Therapy Treatment  Patient Details  Name: Michelle Randall MRN: [id number] Date of Birth: 03-20-1996 No Data Recorded  Encounter Date: 11/16/2016      PT End of Session - 11/16/16 1001    Visit Number 66   Date for PT Re-Evaluation 01/25/17   Authorization Type medicaid   PT Start Time 1000   PT Stop Time 1045   PT Time Calculation (min) 45 min   Equipment Utilized During Treatment Gait belt   Activity Tolerance Patient tolerated treatment well   Behavior During Therapy [facility] for tasks assessed/performed      History reviewed. No pertinent past medical history.  History reviewed. No pertinent surgical history.  There were no vitals filed for this visit.      Subjective Assessment - 11/16/16 1003    Subjective Pt states that she is doing well at this time. Performing HEP and denies pain on this date. No specific questions/concerns currently. She reports that she is doing her stretches for her hips.   Patient is accompained by: Family member   Limitations Walking   Patient Stated Goals Patient wants to improve her core strength.    Currently in Pain? No/denies   Pain Score 0-No pain   Pain Onset In the past 7 days   Multiple Pain Sites No     Therapeutic exercise: TM walking at 1.0 m/ hr  Prone stretching 30 sec x 3  BLE Quadriped LE extension with assist x 10 and UE extension with assist Kneeling with fwd/bwd flex x 10 and side to side with theraball CGA and Min to mod verbal cues used throughout  Continues to have balance deficits typical with diagnosis. Patient performs intermediate level exercises without pain behaviors and needs verbal cuing for postural alignment and head positioning                              PT Education - 11/16/16 1043    Education provided Yes   Education  Details review of prone stretching   Person(s) Educated Patient   Methods Explanation   Comprehension Verbalized understanding             PT Long Term Goals - 11/02/16 1525      PT LONG TERM GOAL #1   Title Patient will improve Dynamic Gait Index (DGI) score to > 19/24 for low falls risk regarding dynamic walking tasks    Baseline 14/24; 11/02/16: 15/24   Time 12   Period Weeks   Status On-going     PT LONG TERM GOAL #2   Title Patient will increase Berg Balance score by > 6 points to demonstrate decreased fall risk during functional activities    Baseline 11/02/16: 34/56   Time 12   Period Weeks   Status On-going     PT LONG TERM GOAL #3   Title Patient will be able to transfer in and out of a large car with Patient will be able to transfer in and out of a high seated car with CGA    Baseline Patient requires min for transfer into large car   Time 12   Period Weeks   Status On-going     PT LONG TERM GOAL #4   Title Patient will complete a TUG test in < 12 seconds  for independent mobility and decreased fall risk    Baseline 11.45   Time 12   Period Weeks   Status Achieved     PT LONG TERM GOAL #5   Title Patient will improve 6 minute walk distance by > 150 ft for improved return to functional community activities    Baseline 740   Time 12   Status On-going     PT LONG TERM GOAL #6   Title Patient will improve gait speed to > 1.2 m/s with least restrictive assistive device to return to normal walking speed    Baseline .76 m/h   Time 12   Period Weeks   Status On-going     PT LONG TERM GOAL #7   Title Patient (< 20 years old) will complete five times sit to stand test in < 10 seconds indicating an increased LE strength and improved balance    Baseline 9.45 sec   Time 12   Period Weeks   Status Achieved     PT LONG TERM GOAL #8   Title Patient will be able to ambulate on inclines and grass independenlty with LRAD   Baseline Patient needs SBA for  ambulation in grass or ueven surfaces   Time 12   Period Weeks   Status On-going     PT LONG TERM GOAL  #9   TITLE Patient will be abe to transfer from low chair or stool wihtout UE support independently   Baseline Patient needs min asssit to transfer from a low stool   Period Weeks   Status Achieved     PT LONG TERM GOAL  #10   TITLE Patient will be able to transfer from the floor to standing independently with use of LRAD   Baseline Patient needs min assist to transfer from the floor to standing   Time 12   Period Weeks   Status On-going               Plan - 11/16/16 1044    Clinical Impression Statement Patient is able to perform core strengthening with therball and min assist for cuing and correct form. She is able to perform quadriped and kneeling exercise postions for core strengthening.    Rehab Potential Good   Clinical Impairments Affecting Rehab Potential weakness and decreased standing balance   PT Frequency 1x / week   PT Duration 12 weeks   PT Treatment/Interventions Therapeutic exercise;Therapeutic activities;Gait training;Balance training;Stair training;DME Instruction;Neuromuscular re-education;Patient/family education   PT Next Visit Plan Bil knee flexion/extension strengthening; Progress LE, core strengthening and balance   PT Home Exercise Plan Continued from previous sessions.    Consulted and Agree with Plan of Care Patient      Patient will benefit from skilled therapeutic intervention in order to improve the following deficits and impairments:  Abnormal gait, Decreased balance, Decreased endurance, Difficulty walking, Decreased strength  Visit Diagnosis: Muscle weakness (generalized)  Other lack of coordination     Problem List There are no active problems to display for this patient.  Ezekiel InaKristine S Treyveon Mochizuki, PT, DPT [place] 11/16/2016, 5:28 PM  [facility] MAIN [facility] SERVICES [address]  [place], [place], [address] Phone: [phone]   Fax:  [phone]  Name: Michelle Randall MRN: [id number] Date of Birth: 07-09-1996

## 2016-11-20 NOTE — Therapy (Signed)
[place] MAIN [facility] SERVICES [address] [place], [place], [address] Phone: [phone]   Fax:  [phone]  Occupational Therapy Treatment  Patient Details  Name: Michelle Randall MRN: [id number] Date of Birth: 01/27/1996 No Data Recorded  Encounter Date: 11/16/2016      OT End of Session - 11/20/16 1723    Visit Number 10   Number of Visits 64   Date for OT Re-Evaluation 12/27/16   Authorization Type medicaid visit 31 of 70   OT Start Time [phone]   OT Stop Time 1000   OT Time Calculation (min) 44 min   Activity Tolerance Patient tolerated treatment well   Behavior During Therapy [facility] for tasks assessed/performed      History reviewed. No pertinent past medical history.  History reviewed. No pertinent surgical history.  There were no vitals filed for this visit.      Subjective Assessment - 11/20/16 1721    Subjective  Patient reports she had a good new years, personal resolutions to make lasting memories with friends.     Patient Stated Goals wants to be independent with all tasks so she will be able to go away to college.   Currently in Pain? No/denies   Pain Score 0-No pain                      OT Treatments/Exercises (OP) - 11/20/16 1728      Fine Motor Coordination   Other Fine Motor Exercises Patient seen for manipulation of perler beads 1/2 centimeter size with tweezers, cues for form and technique.       Neurological Re-education Exercises   Other Exercises 1 Patient seen for BUE strengthening with 1# weight each wrist completing shape tower 4 levels, increasing height reach up and back to original place, cues for technique. Rest breaks as needed. Balloon volley in sitting with reachin                OT Education - 11/20/16 1727    Education provided Yes             OT Long Term Goals - 10/05/16 1440      OT LONG TERM GOAL #1   Title Patient will demonstrate methods of picking up  her bookbag and placing it onto the back of her chair with modified independence to transport items to class.   Baseline requires assistance from others to complete now but will need to be independent when at college in the fall .   Time 6   Period Months   Status Achieved     OT LONG TERM GOAL #2   Title Patient will be able to make her bed with modified independence.   Baseline she continues to have difficulty with balance and completing this task, increased time.   Time 6   Period Months   Status On-going     OT LONG TERM GOAL #3   Title Patient will demonstrate the ability to perform vacuuming of one room with modified independence using light weight vacuum.   Baseline still requires max assist   Time 6   Period Months   Status On-going     OT LONG TERM GOAL #4   Title Patient will demonstrate the ability to open containers with sealed tops such as on yogurt containers and individual ice cream containers.    Baseline increased difficulty with sealed, peel away container tops and requires assistance or modifications to  open   Time 12   Period Weeks   Status New     OT LONG TERM GOAL #5   Title Patient will demonstrate the ability to perform small buttons on shirts independently and with good speed in order to dress for school.   Baseline increased time and mom has to help 50% of the time and if she is under time constraints.    Time 12   Period Weeks   Status New     OT LONG TERM GOAL #6   Title Patient will donn and doff small earrings with modified independence.   Baseline max assist from mom, patient has difficulty holding and dropping with attempts.    Time 12   Period Weeks   Status New     OT LONG TERM GOAL  #9   Baseline Patient will be able to braid her own hair before college   Time 6   Period Months   Status Partially Met     OT LONG TERM GOAL  #10   TITLE --   Baseline --   Time 6   Period --   Status --     OT LONG TERM GOAL  #11   TITLE Will be  able to squeeze tooth paste out of the tube when it is less than half full.   Baseline difficulty with small travel sized.   Time 6   Period Months   Status On-going     OT LONG TERM GOAL  #12   TITLE Patient will demonstrate managing wheelchair rain poncho in inclement weather with modified independence.   Baseline crutch tips are difficult for anyone to take them off, goal deferred.   Time 6   Period Months   Status Achieved     OT LONG TERM GOAL  #13   TITLE Patient will be able to complete 2 1/2 minutes straight with left shoulder flexion simulating driving with a stearing wheel ball to be able to drive to college.    Baseline Has not been able to drive in the last few months, mom or dad takes her to and from college each day.   Time 6   Period Months   Status On-going     OT LONG TERM GOAL  #14   TITLE --   Baseline --   Time --   Period --   Status --     OT LONG TERM GOAL  #15   TITLE Patient will be able to obtain food tray in the cafeteria from a wheelchair level and take to the table without spilling with modified independence.    Baseline max assist   Time 6   Period Months   Status Achieved               Plan - 11/20/16 1723    Clinical Impression Statement Patient continues to progress towards goals, improving speed and dexterity of small items with fine motor coordination and manipulation.  Patient will be returning to school next week and will have a new schedule.  Continue to work towards goals in [place].    Rehab Potential Good   OT Frequency 1x / week   OT Duration 12 weeks   OT Treatment/Interventions Self-care/ADL training;Therapeutic exercise;Therapeutic exercises;Therapeutic activities;DME and/or AE instruction;Patient/family education   Consulted and Agree with Plan of Care Patient      Patient will benefit from skilled therapeutic intervention in order to improve the following deficits and impairments:  Impaired UE  functional use, Difficulty  walking, Decreased strength, Decreased coordination, Decreased endurance, Decreased balance  Visit Diagnosis: Muscle weakness (generalized)  Other lack of coordination    Problem List There are no active problems to display for this patient.  Achilles Dunk, OTR/L, CLT  Lovett,Amy 11/20/2016, 5:28 PM  [place] MAIN [facility] SERVICES [address] [place], [place], [address] Phone: [phone]   Fax:  [phone]  Name: Michelle Randall MRN: [id number] Date of Birth: January 28, 1996

## 2016-11-23 ENCOUNTER — Ambulatory Visit: Payer: Medicaid Other | Admitting: Occupational Therapy

## 2016-11-23 ENCOUNTER — Ambulatory Visit: Payer: Medicaid Other | Admitting: Physical Therapy

## 2016-11-23 ENCOUNTER — Encounter: Payer: Self-pay | Admitting: Occupational Therapy

## 2016-11-23 ENCOUNTER — Encounter: Payer: Self-pay | Admitting: Physical Therapy

## 2016-11-23 DIAGNOSIS — M6281 Muscle weakness (generalized): Secondary | ICD-10-CM | POA: Diagnosis not present

## 2016-11-23 DIAGNOSIS — R262 Difficulty in walking, not elsewhere classified: Secondary | ICD-10-CM

## 2016-11-23 DIAGNOSIS — R278 Other lack of coordination: Secondary | ICD-10-CM

## 2016-11-23 NOTE — Therapy (Signed)
[place] MAIN [facility] SERVICES [address] [place], [place], [address] Phone: [phone]   Fax:  [phone]  Occupational Therapy Treatment  Patient Details  Name: Michelle Randall MRN: [id number] Date of Birth: 23-Feb-1996 No Data Recorded  Encounter Date: 11/23/2016      OT End of Session - 11/23/16 1524    Visit Number 69   Number of Visits 58   Date for OT Re-Evaluation 12/27/16   Authorization Type medicaid visit 13 of 1   OT Start Time 0915   OT Stop Time 1000   OT Time Calculation (min) 45 min   Activity Tolerance Patient tolerated treatment well   Behavior During Therapy [facility] for tasks assessed/performed      History reviewed. No pertinent past medical history.  History reviewed. No pertinent surgical history.  There were no vitals filed for this visit.      Subjective Assessment - 11/23/16 0919    Subjective  Patient reports she is enjoying her winter term at [employer], only one class but it is everyday for 4 hours.  Class is about health care.    Patient Stated Goals wants to be independent with all tasks so she will be able to go away to college.   Currently in Pain? No/denies   Pain Score 0-No pain                      OT Treatments/Exercises (OP) - 11/23/16 1520      Fine Motor Coordination   Other Fine Motor Exercises Patient seen for focus on fine motor coordination skills with [employer] with manipulation of small dowels, washers and collars with cues for prehension patterns on the right hand and left hand working on speed, dexterity and using the hand for storage.       Neurological Re-education Exercises   Other Exercises 1 Patient seen for resistive pinch skills with all levels of resistance to place onto elevated surface, right and left hands, place and remove each hand, cues for reaching and type of pinch.  2# dowel exercises in sitting for shoulder flexion, ABD/ADD, chest press, forwards/backwards  circles and bicep curl for 15 reps for 1-2 sets each.                  OT Education - 11/23/16 1523    Education provided Yes   Education Details overhead reaching and endurance for braiding   Person(s) Educated Patient   Methods Explanation;Demonstration;Verbal cues   Comprehension Verbal cues required;Returned demonstration;Verbalized understanding             OT Long Term Goals - 10/05/16 1440      OT LONG TERM GOAL #1   Title Patient will demonstrate methods of picking up her bookbag and placing it onto the back of her chair with modified independence to transport items to class.   Baseline requires assistance from others to complete now but will need to be independent when at college in the fall .   Time 6   Period Months   Status Achieved     OT LONG TERM GOAL #2   Title Patient will be able to make her bed with modified independence.   Baseline she continues to have difficulty with balance and completing this task, increased time.   Time 6   Period Months   Status On-going     OT LONG TERM GOAL #3   Title Patient will demonstrate the ability to  perform vacuuming of one room with modified independence using light weight vacuum.   Baseline still requires max assist   Time 6   Period Months   Status On-going     OT LONG TERM GOAL #4   Title Patient will demonstrate the ability to open containers with sealed tops such as on yogurt containers and individual ice cream containers.    Baseline increased difficulty with sealed, peel away container tops and requires assistance or modifications to open   Time 12   Period Weeks   Status New     OT LONG TERM GOAL #5   Title Patient will demonstrate the ability to perform small buttons on shirts independently and with good speed in order to dress for school.   Baseline increased time and mom has to help 50% of the time and if she is under time constraints.    Time 12   Period Weeks   Status New     OT LONG TERM  GOAL #6   Title Patient will donn and doff small earrings with modified independence.   Baseline max assist from mom, patient has difficulty holding and dropping with attempts.    Time 12   Period Weeks   Status New     OT LONG TERM GOAL  #9   Baseline Patient will be able to braid her own hair before college   Time 6   Period Months   Status Partially Met     OT LONG TERM GOAL  #10   TITLE --   Baseline --   Time 6   Period --   Status --     OT LONG TERM GOAL  #11   TITLE Will be able to squeeze tooth paste out of the tube when it is less than half full.   Baseline difficulty with small travel sized.   Time 6   Period Months   Status On-going     OT LONG TERM GOAL  #12   TITLE Patient will demonstrate managing wheelchair rain poncho in inclement weather with modified independence.   Baseline crutch tips are difficult for anyone to take them off, goal deferred.   Time 6   Period Months   Status Achieved     OT LONG TERM GOAL  #13   TITLE Patient will be able to complete 2 1/2 minutes straight with left shoulder flexion simulating driving with a stearing wheel ball to be able to drive to college.    Baseline Has not been able to drive in the last few months, mom or dad takes her to and from college each day.   Time 6   Period Months   Status On-going     OT LONG TERM GOAL  #14   TITLE --   Baseline --   Time --   Period --   Status --     OT LONG TERM GOAL  #15   TITLE Patient will be able to obtain food tray in the cafeteria from a wheelchair level and take to the table without spilling with modified independence.    Baseline max assist   Time 6   Period Months   Status Achieved               Plan - 11/23/16 1524    Clinical Impression Statement Patient has continued to progress in all areas of care, she still feels like fine motor coordination tasks are difficult, especially with items less than 1/2  inch in size and definitely 1/4 inch in size with  speed and dexterity.  She benefits from cues for prehension patterns and for activities for overhead reach.  Continue to work towards goals in plan to improve UE function for daily tasks.     Rehab Potential Good   OT Frequency 1x / week   OT Duration 12 weeks   OT Treatment/Interventions Self-care/ADL training;Therapeutic exercise;Therapeutic exercises;Therapeutic activities;DME and/or AE instruction;Patient/family education   Consulted and Agree with Plan of Care Patient      Patient will benefit from skilled therapeutic intervention in order to improve the following deficits and impairments:  Impaired UE functional use, Difficulty walking, Decreased strength, Decreased coordination, Decreased endurance, Decreased balance  Visit Diagnosis: Muscle weakness (generalized)  Other lack of coordination    Problem List There are no active problems to display for this patient.  Achilles Dunk, OTR/L, CLT  Adaliz Dobis 11/23/2016, 3:27 PM  [place] MAIN [facility] SERVICES [address] [place], [place], [address] Phone: [phone]   Fax:  [phone]  Name: Michelle Randall MRN: [id number] Date of Birth: 14-May-1996

## 2016-11-23 NOTE — Therapy (Signed)
[place] [facility] MAIN [facility] SERVICES [address] [place], [place], [address] Phone: [phone]   Fax:  [phone]  Physical Therapy Treatment  Patient Details  Name: Michelle Randall MRN: [id number] Date of Birth: August 27, 1996 No Data Recorded  Encounter Date: 11/23/2016      PT End of Session - 11/23/16 1006    Visit Number 67   Date for PT Re-Evaluation 01/25/17   Authorization Type medicaid   PT Start Time 1000   PT Stop Time 1045   PT Time Calculation (min) 45 min   Equipment Utilized During Treatment Gait belt   Activity Tolerance Patient tolerated treatment well;Patient limited by fatigue   Behavior [facility] tasks assessed/performed      History reviewed. No pertinent past medical history.  History reviewed. No pertinent surgical history.  There were no vitals filed for this visit.      Subjective Assessment - 11/23/16 1005    Subjective Pt states she is doing well and classes are going well. She states she has no pain on this date. 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    Limitations Walking   Currently in Pain? No/denies   Pain Score 0-No pain   Multiple Pain Sites No      Therapeutic exercise: Bridges x15 Supine marching BLE active assisted 20x3 Sidelying hip flexion active assisted BLE x15 each Active assisted sliding board hip flexion BLE 20x3 Active assisted sliding board hip abduction BLE 20x3 Patient needs more assistance with RLE than LLE due to more profound weakness on R side; patient needs assistance with hip flexion in order to keep LE in frontal plane and not abduct or adduct leg.  Prone passive knee flexion BLE x5 30s Prone planks on elbows x3 60s Patient was unable to perform a full 60s during the second plank Side planks on elbows with straight legs both sides  60s Patient needs correction of exercises verbally and tactile during hip flex and hip abd and needs sliding board and pillow cases.Pateint reports no pain during session and does have some fatigue.                          PT Education - 11/23/16 1200    Education provided Yes   Education Details HEP use of TM and stretching   Person(s) Educated Patient   Methods Explanation   Comprehension Verbalized understanding             PT Long Term Goals - 11/02/16 1525      PT LONG TERM GOAL #1   Title Patient will improve Dynamic Gait Index (DGI) score to > 19/24 for low falls risk regarding dynamic walking tasks    Baseline 14/24; 11/02/16: 15/24   Time 12   Period Weeks   Status On-going     PT LONG TERM GOAL #2   Title Patient will increase Berg Balance score by > 6 points to demonstrate decreased fall risk during functional activities    Baseline 11/02/16: 34/56   Time 12   Period Weeks   Status On-going     PT LONG TERM GOAL #3   Title Patient will be able to transfer in and out of a large car with Patient will be able to transfer  in and out of a high seated car with CGA    Baseline Patient requires min for transfer into large car   Time 12   Period Weeks   Status On-going     PT LONG TERM GOAL #4   Title Patient will complete a TUG test in < 12 seconds for independent mobility and decreased fall risk    Baseline 11.45   Time 12   Period Weeks   Status Achieved     PT LONG TERM GOAL #5   Title Patient will improve 6 minute walk distance by > 150 ft for improved return to functional community activities    Baseline 740   Time 12   Status On-going     PT LONG TERM GOAL #6   Title Patient will improve gait speed to > 1.2 m/s with least restrictive assistive device to return to normal walking speed    Baseline .76 m/h   Time 12   Period Weeks   Status On-going     PT LONG TERM GOAL #7   Title Patient (< 36 years old) will complete five  times sit to stand test in < 10 seconds indicating an increased LE strength and improved balance    Baseline 9.45 sec   Time 12   Period Weeks   Status Achieved     PT LONG TERM GOAL #8   Title Patient will be able to ambulate on inclines and grass independenlty with LRAD   Baseline Patient needs SBA for ambulation in grass or ueven surfaces   Time 12   Period Weeks   Status On-going     PT LONG TERM GOAL  #9   TITLE Patient will be abe to transfer from low chair or stool wihtout UE support independently   Baseline Patient needs min asssit to transfer from a low stool   Period Weeks   Status Achieved     PT LONG TERM GOAL  #10   TITLE Patient will be able to transfer from the floor to standing independently with use of LRAD   Baseline Patient needs min assist to transfer from the floor to standing   Time 12   Period Weeks   Status On-going               Plan - 11/23/16 1201    Clinical Impression Statement Patient is able to perform LE exercises with AAROM during B hip flex and abd exercises. She is able to perform 3 prone planks x 1 minute each and has some fatigue with LE exercises  with short rest periods throughout session. She tolerated BLE hip flex stretches.    Rehab Potential Good   Clinical Impairments Affecting Rehab Potential weakness and decreased standing balance   PT Frequency 1x / week   PT Duration 12 weeks   PT Treatment/Interventions Therapeutic exercise;Therapeutic activities;Gait training;Balance training;Stair training;DME Instruction;Neuromuscular re-education;Patient/family education   PT Next Visit Plan Bil knee flexion/extension strengthening; Progress LE, core strengthening and balance   PT Home Exercise Plan Continued from previous sessions.    Consulted and Agree with Plan of Care Patient      Patient will benefit from skilled therapeutic intervention in order to improve the following deficits and impairments:  Abnormal gait, Decreased  balance, Decreased endurance, Difficulty walking, Decreased strength  Visit Diagnosis: Muscle weakness (generalized)  Other lack of coordination  Difficulty in walking, not elsewhere classified     Problem List There are no active problems to display  for this patient.  [name], PT, DPT [place], Barkley Bruns S 11/23/2016, 12:05 PM  [place] [facility] MAIN [facility] SERVICES [address] [place], [place], [address] Phone: [phone]   Fax:  [phone]  Name: Michelle Randall MRN: [id number] Date of Birth: August 09, 1996

## 2016-11-30 ENCOUNTER — Ambulatory Visit: Payer: Medicaid Other | Admitting: Physical Therapy

## 2016-11-30 ENCOUNTER — Ambulatory Visit: Payer: Medicaid Other | Admitting: Occupational Therapy

## 2016-11-30 ENCOUNTER — Encounter: Payer: Self-pay | Admitting: Physical Therapy

## 2016-11-30 DIAGNOSIS — R278 Other lack of coordination: Secondary | ICD-10-CM

## 2016-11-30 DIAGNOSIS — M6281 Muscle weakness (generalized): Secondary | ICD-10-CM | POA: Diagnosis not present

## 2016-11-30 DIAGNOSIS — R262 Difficulty in walking, not elsewhere classified: Secondary | ICD-10-CM

## 2016-11-30 NOTE — Therapy (Signed)
[place] [facility] MAIN [facility] SERVICES [address] [place], [place], [address] Phone: [phone]   Fax:  [phone]  Physical Therapy Treatment  Patient Details  Name: Michelle Randall MRN: [id number] Date of Birth: [birth year] No Data Recorded  Encounter Date: 11/30/2016      PT End of Session - 11/30/16 1010    Visit Number 68   Number of Visits 44   Date for PT Re-Evaluation 01/25/17   Authorization Type medicaid   PT Start Time 1000   PT Stop Time 1045   PT Time Calculation (min) 45 min   Equipment Utilized During Treatment Gait belt   Activity Tolerance Patient tolerated treatment well;Patient limited by fatigue   Behavior During Therapy [facility] for tasks assessed/performed      History reviewed. No pertinent past medical history.  History reviewed. No pertinent surgical history.  There were no vitals filed for this visit.      Subjective Assessment - 11/30/16 1006    Subjective Pt states she is doing well and classes are still going well. She states she has no pain on this date and that she is walking on the TM at home when she doesn't get home from school late.   Patient is accompained by: Family member   Limitations Walking   Patient Stated Goals Patient wants to improve her core strength.    Currently in Pain? No/denies   Pain Score 0-No pain   Multiple Pain Sites No       Therapeutic exercise: Bridges x15 Supine marching BLE 15x3 Sidelying planks 60s x2 L and R Planks prone on elbows 60s x3 Active assisted shoulder flexion on knees with assist of exercise ball x20 Active assisted shoulder abduction and adduction on knees with assist of exercise ball x20 Active shoulder flexion in quadruped x20 BUE Active hip extension in quadruped x20 BLE Kneeling with theraball side to side x20 Kneeling with theraball up and down x20 Kneeling with theraball chopping B sides  Patient needing cueing to maintain center of gravity  during exercises in quadruped. Patient tolerated exercise well but needs frequent rest breaks.            PT Education - 11/30/16 1008    Education provided Yes   Education Details TM walking at home and continuing HEP stretches    Person(s) Educated Patient   Methods Explanation   Comprehension Verbalized understanding             PT Long Term Goals - 11/02/16 1525      PT LONG TERM GOAL #1   Title Patient will improve Dynamic Gait Index (DGI) score to > 19/24 for low falls risk regarding dynamic walking tasks    Baseline 14/24; 11/02/16: 15/24   Time 12   Period Weeks   Status On-going     PT LONG TERM GOAL #2   Title Patient will increase Berg Balance score by > 6 points to demonstrate decreased fall risk during functional activities    Baseline 11/02/16: 34/56   Time 12   Period Weeks   Status On-going     PT LONG TERM GOAL #3   Title Patient will be able to transfer in and out of a large car with Patient will be able to transfer in and out of a high seated car with CGA    Baseline Patient requires min for transfer into large car   Time 12   Period Weeks   Status On-going  PT LONG TERM GOAL #4   Title Patient will complete a TUG test in < 12 seconds for independent mobility and decreased fall risk    Baseline 11.45   Time 12   Period Weeks   Status Achieved     PT LONG TERM GOAL #5   Title Patient will improve 6 minute walk distance by > 150 ft for improved return to functional community activities    Baseline 740   Time 12   Status On-going     PT LONG TERM GOAL #6   Title Patient will improve gait speed to > 1.2 m/s with least restrictive assistive device to return to normal walking speed    Baseline .76 m/h   Time 12   Period Weeks   Status On-going     PT LONG TERM GOAL #7   Title Patient (< 20 years old) will complete five times sit to stand test in < 10 seconds indicating an increased LE strength and improved balance    Baseline 9.45  sec   Time 12   Period Weeks   Status Achieved     PT LONG TERM GOAL #8   Title Patient will be able to ambulate on inclines and grass independenlty with LRAD   Baseline Patient needs SBA for ambulation in grass or ueven surfaces   Time 12   Period Weeks   Status On-going     PT LONG TERM GOAL  #9   TITLE Patient will be abe to transfer from low chair or stool wihtout UE support independently   Baseline Patient needs min asssit to transfer from a low stool   Period Weeks   Status Achieved     PT LONG TERM GOAL  #10   TITLE Patient will be able to transfer from the floor to standing independently with use of LRAD   Baseline Patient needs min assist to transfer from the floor to standing   Time 12   Period Weeks   Status On-going               Plan - 11/30/16 1050    Clinical Impression Statement Patient is able to perform therapeutic exercises well, but still requires verbal cueing in order to maintain the correct position. Patient requires frequent rest breaks throughout session. Pt will continue to benefit from skilled therapy in order to improve endurance and strength.   Rehab Potential Good   Clinical Impairments Affecting Rehab Potential weakness and decreased standing balance   PT Frequency 1x / week   PT Duration 12 weeks   PT Treatment/Interventions Therapeutic exercise;Therapeutic activities;Gait training;Balance training;Stair training;DME Instruction;Neuromuscular re-education;Patient/family education   PT Next Visit Plan Bil knee flexion/extension strengthening; Progress LE, core strengthening and balance   PT Home Exercise Plan Continued from previous sessions.    Consulted and Agree with Plan of Care Patient      Patient will benefit from skilled therapeutic intervention in order to improve the following deficits and impairments:  Abnormal gait, Decreased balance, Decreased endurance, Difficulty walking, Decreased strength  Visit Diagnosis: Muscle  weakness (generalized)  Other lack of coordination  Difficulty in walking, not elsewhere classified     Problem List There are no active problems to display for this patient. This entire session was performed under direct supervision and direction of a licensed therapist/therapist assistant . I have personally read, edited and approve of the note as written. Ezekiel InaKristine S [name], PT, DPT [place]ola MajorKara Tomio Kirk, SPT [place] William Laske 11/30/2016, 10:56 AM  [place] [facility] MAIN [facility] SERVICES [address] [place], [place], [address] Phone: [phone]   Fax:  [phone]  Name: Michelle Randall MRN: [id number] Date of Birth: Jan 24, 1996

## 2016-12-01 ENCOUNTER — Encounter: Payer: Self-pay | Admitting: Occupational Therapy

## 2016-12-01 NOTE — Therapy (Signed)
[facility] MAIN [facility] SERVICES [address] [place], [place], [address] Phone: [phone]   Fax:  [phone]  Occupational Therapy Treatment  Patient Details  Name: Michelle Randall MRN: [id number] Date of Birth: 1996/05/09 No Data Recorded  Encounter Date: 11/30/2016      OT End of Session - 12/01/16 1216    Visit Number 59   Number of Visits 70   Date for OT Re-Evaluation 12/27/16   Authorization Type medicaid visit 38 of 48   OT Start Time 0915   OT Stop Time 1001   OT Time Calculation (min) 46 min   Activity Tolerance Patient tolerated treatment well   Behavior During Therapy [facility] for tasks assessed/performed      History reviewed. No pertinent past medical history.  History reviewed. No pertinent surgical history.  There were no vitals filed for this visit.      Subjective Assessment - 12/01/16 1206    Subjective  Patient reports she is worried about pending weather tomorrow, snow predicted and she is concerned [employer] will still be open, worried about walking with her crutches to get into the library to get her power chair.  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    Patient is accompained by: Family member   Patient Stated Goals wants to be independent with all tasks so she will be able to go away to college.   Currently in Pain? No/denies   Pain Score 0-No pain                      OT Treatments/Exercises (OP) - 12/01/16 1210      Fine Motor Coordination   Other Fine Motor Exercises Patient seen for fine motor coordination tasks with manipulation of 100 pegboard to pick up, turn and flip items with cues for prehension patterns.  Manipulation of glass beads from tabletop, rounded edge on one side, flat on other side, cues for manipulation patterns with right and left hands.      Neurological Re-education Exercises   Other Exercises 1 Patient seen this date for focus on grip strength right and  left hands, 17# for 25 reps and then 23# for 25 reps, cues for gripping patterns with hand gripper.  Red theraband exercises for shoulder flexion, ABD, diagonal patterns, elbow flexion/extension.  Cues for form and technique.                  OT Education - 12/01/16 1215    Education provided Yes   Education Details fine motor coordination tasks for home program with manipulation of glass beads.   Person(s) Educated Patient   Methods Explanation;Demonstration;Verbal cues   Comprehension Verbal cues required;Returned demonstration;Verbalized understanding             OT Long Term Goals - 12/01/16 1218      OT LONG TERM GOAL #1   Title Patient will demonstrate methods of picking up her bookbag and placing it onto the back of her chair with modified independence to transport items to class.   Baseline requires assistance from others to complete now but will need to be independent when at college in the fall .   Time 6   Period Months   Status Achieved     OT LONG TERM GOAL #2   Title Patient will be able to make her bed with modified independence.   Baseline she continues to have difficulty with balance and completing  this task, increased time.   Time 6   Period Months   Status On-going     OT LONG TERM GOAL #3   Title Patient will demonstrate the ability to perform vacuuming of one room with modified independence using light weight vacuum.   Baseline still requires max assist   Time 6   Period Months   Status On-going     OT LONG TERM GOAL #4   Title Patient will demonstrate the ability to open containers with sealed tops such as on yogurt containers and individual ice cream containers.    Baseline increased difficulty with sealed, peel away container tops and requires assistance or modifications to open   Time 12   Period Weeks   Status On-going     OT LONG TERM GOAL #5   Title Patient will demonstrate the ability to perform small buttons on shirts  independently and with good speed in order to dress for school.   Baseline increased time and mom has to help 50% of the time and if she is under time constraints.    Time 12   Period Weeks   Status On-going     OT LONG TERM GOAL #6   Title Patient will donn and doff small earrings with modified independence.   Baseline max assist from mom, patient has difficulty holding and dropping with attempts.    Time 12   Period Weeks   Status On-going     OT LONG TERM GOAL  #9   Baseline Patient will be able to braid her own hair before college   Time 6   Period Months   Status On-going     OT LONG TERM GOAL  #10   Time 6     OT LONG TERM GOAL  #11   TITLE Will be able to squeeze tooth paste out of the tube when it is less than half full.   Baseline difficulty with small travel sized.   Time 6   Period Months   Status On-going     OT LONG TERM GOAL  #12   TITLE Patient will demonstrate managing wheelchair rain poncho in inclement weather with modified independence.   Baseline crutch tips are difficult for anyone to take them off, goal deferred.   Time 6   Period Months   Status Achieved     OT LONG TERM GOAL  #13   TITLE Patient will be able to complete 2 1/2 minutes straight with left shoulder flexion simulating driving with a stearing wheel ball to be able to drive to college.    Baseline Has not been able to drive in the last few months, mom or dad takes her to and from college each day.   Time 6   Period Months   Status On-going     OT LONG TERM GOAL  #15   TITLE Patient will be able to obtain food tray in the cafeteria from a wheelchair level and take to the table without spilling with modified independence.    Baseline max assist   Time 6   Period Months   Status Achieved               Plan - 12/01/16 1216    Clinical Impression Statement Patient able to increase repetitions of theraband exercises to 15 reps for 2 sets.  Coordination improving with speed and  dexterity with manipulation of smaller objects.  Continue to work towards goals to improve UE functional use, strength  and coordination to improve independence in self care and school tasks.    Rehab Potential Good   OT Frequency 1x / week   OT Duration 12 weeks   OT Treatment/Interventions Self-care/ADL training;Therapeutic exercise;Therapeutic exercises;Therapeutic activities;DME and/or AE instruction;Patient/family education   Consulted and Agree with Plan of Care Patient      Patient will benefit from skilled therapeutic intervention in order to improve the following deficits and impairments:  Impaired UE functional use, Difficulty walking, Decreased strength, Decreased coordination, Decreased endurance, Decreased balance  Visit Diagnosis: Muscle weakness (generalized)  Other lack of coordination    Problem List There are no active problems to display for this patient.  Kerrie Buffalo, OTR/L, CLT  Martavis Gurney 12/01/2016, 12:22 PM  [facility] MAIN [facility] SERVICES [address] [place], [place], [address] Phone: [phone]   Fax:  [phone]  Name: Michelle Randall MRN: [id number] Date of Birth: 11-21-1995

## 2016-12-07 ENCOUNTER — Encounter: Payer: Self-pay | Admitting: Occupational Therapy

## 2016-12-07 ENCOUNTER — Ambulatory Visit: Payer: Medicaid Other | Admitting: Occupational Therapy

## 2016-12-07 ENCOUNTER — Encounter: Payer: Self-pay | Admitting: Physical Therapy

## 2016-12-07 ENCOUNTER — Ambulatory Visit: Payer: Medicaid Other | Admitting: Physical Therapy

## 2016-12-07 DIAGNOSIS — M6281 Muscle weakness (generalized): Secondary | ICD-10-CM

## 2016-12-07 DIAGNOSIS — R278 Other lack of coordination: Secondary | ICD-10-CM

## 2016-12-07 NOTE — Therapy (Signed)
[facility] MAIN [facility] SERVICES [address] [place], [place], [address] Phone: [phone]   Fax:  [phone]  Physical Therapy Treatment  Patient Details  Name: Michelle Randall MRN: [id number] Date of Birth: Mar 17, 1996 No Data Recorded  Encounter Date: 12/07/2016      PT End of Session - 12/07/16 1644    Visit Number 69   Number of Visits 44   Date for PT Re-Evaluation 01/25/17   Authorization Type medicaid   PT Start Time 0445   PT Stop Time 0530   PT Time Calculation (min) 45 min   Activity Tolerance Patient tolerated treatment well;Patient limited by fatigue   Behavior During Therapy [facility] for tasks assessed/performed      History reviewed. No pertinent past medical history.  History reviewed. No pertinent surgical history.  There were no vitals filed for this visit.      Subjective Assessment - 12/07/16 1651    Subjective Pt states she is doing well and she is excited because she's done with classes for the week. She did not go outside much last week due to the snow and ice. She states she has no pain on this date    Patient is accompained by: Family member   Limitations Walking   Patient Stated Goals Patient wants to improve her core strength.    Currently in Pain? No/denies   Pain Score 0-No pain   Multiple Pain Sites No       Therapeutic exercise: Bridges x15 Supine marching BLE 15x3 Sidelying planks 60s x2 L and R Planks prone on elbows 45s x2 Active shoulder flexion in quadruped x20 BUE Active hip extension in quadruped x20 BLE Active assisted shoulder flexion on knees with assist of exercise ball x20 Active assisted shoulder abduction and adduction on knees with assist of exercise ball x20 Kneeling with theraball side to side x20 Kneeling with theraball up and down x20 Kneeling with theraball chopping B sides x10 each   Patient needing cueing to maintain center of gravity during exercises in quadruped.  Patient tolerated exercise well but needs frequent rest breaks. Patient was originally told to perform 60s planks but was unable to hold for that long.         PT Education - 12/07/16 1653    Education provided Yes   Education Details continuance of treadmill walking at home   Person(s) Educated Patient   Methods Explanation   Comprehension Verbalized understanding             PT Long Term Goals - 11/02/16 1525      PT LONG TERM GOAL #1   Title Patient will improve Dynamic Gait Index (DGI) score to > 19/24 for low falls risk regarding dynamic walking tasks    Baseline 14/24; 11/02/16: 15/24   Time 12   Period Weeks   Status On-going     PT LONG TERM GOAL #2   Title Patient will increase Berg Balance score by > 6 points to demonstrate decreased fall risk during functional activities    Baseline 11/02/16: 34/56   Time 12   Period Weeks   Status On-going     PT LONG TERM GOAL #3   Title Patient will be able to transfer in and out of a large car with Patient will be able to transfer in and out of a high seated car with CGA    Baseline Patient requires min for transfer into large car   Time 12  Period Weeks   Status On-going     PT LONG TERM GOAL #4   Title Patient will complete a TUG test in < 12 seconds for independent mobility and decreased fall risk    Baseline 11.45   Time 12   Period Weeks   Status Achieved     PT LONG TERM GOAL #5   Title Patient will improve 6 minute walk distance by > 150 ft for improved return to functional community activities    Baseline 740   Time 12   Status On-going     PT LONG TERM GOAL #6   Title Patient will improve gait speed to > 1.2 m/s with least restrictive assistive device to return to normal walking speed    Baseline .76 m/h   Time 12   Period Weeks   Status On-going     PT LONG TERM GOAL #7   Title Patient (< 41 years old) will complete five times sit to stand test in < 10 seconds indicating an increased LE  strength and improved balance    Baseline 9.45 sec   Time 12   Period Weeks   Status Achieved     PT LONG TERM GOAL #8   Title Patient will be able to ambulate on inclines and grass independenlty with LRAD   Baseline Patient needs SBA for ambulation in grass or ueven surfaces   Time 12   Period Weeks   Status On-going     PT LONG TERM GOAL  #9   TITLE Patient will be abe to transfer from low chair or stool wihtout UE support independently   Baseline Patient needs min asssit to transfer from a low stool   Period Weeks   Status Achieved     PT LONG TERM GOAL  #10   TITLE Patient will be able to transfer from the floor to standing independently with use of LRAD   Baseline Patient needs min assist to transfer from the floor to standing   Time 12   Period Weeks   Status On-going               Plan - 12/07/16 1730    Clinical Impression Statement Patient performs therapeutic exercises with min assist, but continues to require verbal cueing in order to maintain correct position. She also required cueing to maintain center of gravity during kneeling and quadruped exercises. Patient requires frequent rest breaks. Patient will continue to benefit from skilled therapy to improve strength and endurance.   Rehab Potential Good   Clinical Impairments Affecting Rehab Potential weakness and decreased standing balance   PT Frequency 1x / week   PT Duration 12 weeks   PT Treatment/Interventions Therapeutic exercise;Therapeutic activities;Gait training;Balance training;Stair training;DME Instruction;Neuromuscular re-education;Patient/family education   PT Next Visit Plan Bil knee flexion/extension strengthening; Progress LE, core strengthening and balance   PT Home Exercise Plan Continued from previous sessions.    Consulted and Agree with Plan of Care Patient      Patient will benefit from skilled therapeutic intervention in order to improve the following deficits and impairments:   Abnormal gait, Decreased balance, Decreased endurance, Difficulty walking, Decreased strength  Visit Diagnosis: Muscle weakness (generalized)  Other lack of coordination     Problem List There are no active problems to display for this patient.  [name], PT, DPT This entire session was performed under direct supervision and direction of a licensed therapist/therapist assistant . I have personally read, edited and approve of  the note as written. Nickola MajorKara Danell Verno, SPT Nickola MajorKara Kariss Longmire 12/07/2016, 5:33 PM  [facility] MAIN [facility] SERVICES [address] [place], [place], [address] Phone: [phone]   Fax:  [phone]  Name: Michelle Randall MRN: [id number] Date of Birth: 1996/09/28

## 2016-12-13 ENCOUNTER — Encounter: Payer: Self-pay | Admitting: Occupational Therapy

## 2016-12-13 NOTE — Therapy (Signed)
[facility] MAIN [facility] SERVICES [address] [place], [place], [address] Phone: [phone]   Fax:  [phone]  Occupational Therapy Treatment  Patient Details  Name: Michelle Randall MRN: [id number] Date of Birth: 28-Dec-1995 No Data Recorded  Encounter Date: 12/07/2016      OT End of Session - 12/13/16 1430    Visit Number 60   Number of Visits 70   Date for OT Re-Evaluation 12/27/16   Authorization Type medicaid visit 39 of 48   OT Start Time 1600   OT Stop Time 1644   OT Time Calculation (min) 44 min   Activity Tolerance Patient tolerated treatment well   Behavior During Therapy [facility] for tasks assessed/performed      History reviewed. No pertinent past medical history.  History reviewed. No pertinent surgical history.  There were no vitals filed for this visit.      Subjective Assessment - 12/13/16 1425    Subjective  Patient reports she just finished with winter term at school and has the rest of the week off.  She will start the spring semester on Monday.  She will be taking a voice class, religion, english,  statistics, and public health.      Patient is accompained by: Family member   Patient Stated Goals wants to be independent with all tasks so she will be able to go away to college.   Currently in Pain? No/denies   Pain Score 0-No pain                      OT Treatments/Exercises (OP) - 12/13/16 1426      Fine Motor Coordination   Other Fine Motor Exercises Patient seen this date for manipulation of small dowel sticks with use of oppositional movements with right and left thumb and index, middle finger alternating to pick up, place and remove with cues.   Card shuffling with bilateral hands with cues for technique, dealing and sorting by color, suit using thumb finger combinations.      Neurological Re-education Exercises   Other Exercises 1 Patient seen for grip strength with right and left hands for  sustained grip, 3rd setting 17.9# for 25 reps for 2 sets each hand.                  OT Education - 12/13/16 1430    Education provided Yes             OT Long Term Goals - 12/01/16 1218      OT LONG TERM GOAL #1   Title Patient will demonstrate methods of picking up her bookbag and placing it onto the back of her chair with modified independence to transport items to class.   Baseline requires assistance from others to complete now but will need to be independent when at college in the fall .   Time 6   Period Months   Status Achieved     OT LONG TERM GOAL #2   Title Patient will be able to make her bed with modified independence.   Baseline she continues to have difficulty with balance and completing this task, increased time.   Time 6   Period Months   Status On-going     OT LONG TERM GOAL #3   Title Patient will demonstrate the ability to perform vacuuming of one room with modified independence using light weight vacuum.   Baseline still requires max assist   Time  6   Period Months   Status On-going     OT LONG TERM GOAL #4   Title Patient will demonstrate the ability to open containers with sealed tops such as on yogurt containers and individual ice cream containers.    Baseline increased difficulty with sealed, peel away container tops and requires assistance or modifications to open   Time 12   Period Weeks   Status On-going     OT LONG TERM GOAL #5   Title Patient will demonstrate the ability to perform small buttons on shirts independently and with good speed in order to dress for school.   Baseline increased time and mom has to help 50% of the time and if she is under time constraints.    Time 12   Period Weeks   Status On-going     OT LONG TERM GOAL #6   Title Patient will donn and doff small earrings with modified independence.   Baseline max assist from mom, patient has difficulty holding and dropping with attempts.    Time 12   Period Weeks    Status On-going     OT LONG TERM GOAL  #9   Baseline Patient will be able to braid her own hair before college   Time 6   Period Months   Status On-going     OT LONG TERM GOAL  #10   Time 6     OT LONG TERM GOAL  #11   TITLE Will be able to squeeze tooth paste out of the tube when it is less than half full.   Baseline difficulty with small travel sized.   Time 6   Period Months   Status On-going     OT LONG TERM GOAL  #12   TITLE Patient will demonstrate managing wheelchair rain poncho in inclement weather with modified independence.   Baseline crutch tips are difficult for anyone to take them off, goal deferred.   Time 6   Period Months   Status Achieved     OT LONG TERM GOAL  #13   TITLE Patient will be able to complete 2 1/2 minutes straight with left shoulder flexion simulating driving with a stearing wheel ball to be able to drive to college.    Baseline Has not been able to drive in the last few months, mom or dad takes her to and from college each day.   Time 6   Period Months   Status On-going     OT LONG TERM GOAL  #15   TITLE Patient will be able to obtain food tray in the cafeteria from a wheelchair level and take to the table without spilling with modified independence.    Baseline max assist   Time 6   Period Months   Status Achieved               Plan - 12/13/16 1430    Clinical Impression Statement Patient progressing with coordination tasks with manipulation of objects such as small dowels, cards with shuffling, dealing and sorting tasks with minimal cues for technique.  Patient able to perform 3rd grip setting for both hands this date for 2 sets of 25. Continue to work towards goals to improve independence in daily tasks.    Rehab Potential Good   OT Frequency 1x / week   OT Duration 12 weeks   OT Treatment/Interventions Self-care/ADL training;Therapeutic exercise;Therapeutic exercises;Therapeutic activities;DME and/or AE  instruction;Patient/family education   Consulted and Agree with Plan of  Care Patient      Patient will benefit from skilled therapeutic intervention in order to improve the following deficits and impairments:  Impaired UE functional use, Difficulty walking, Decreased strength, Decreased coordination, Decreased endurance, Decreased balance  Visit Diagnosis: Muscle weakness (generalized)  Other lack of coordination    Problem List There are no active problems to display for this patient.  Kerrie Buffalo, OTR/L, CLT  Joh Rao 12/13/2016, 2:33 PM  [facility] MAIN [facility] SERVICES [address] [place], [place], [address] Phone: [phone]   Fax:  [phone]  Name: Michelle Randall MRN: [id number] Date of Birth: [birth year]

## 2016-12-14 ENCOUNTER — Ambulatory Visit: Payer: Medicaid Other | Admitting: Physical Therapy

## 2016-12-14 ENCOUNTER — Ambulatory Visit: Payer: Medicaid Other | Admitting: Occupational Therapy

## 2016-12-17 ENCOUNTER — Ambulatory Visit: Payer: Medicaid Other | Admitting: Occupational Therapy

## 2016-12-17 ENCOUNTER — Ambulatory Visit: Payer: Medicaid Other | Attending: Pediatrics

## 2016-12-17 DIAGNOSIS — M6281 Muscle weakness (generalized): Secondary | ICD-10-CM | POA: Insufficient documentation

## 2016-12-17 DIAGNOSIS — R262 Difficulty in walking, not elsewhere classified: Secondary | ICD-10-CM | POA: Diagnosis present

## 2016-12-17 DIAGNOSIS — R278 Other lack of coordination: Secondary | ICD-10-CM | POA: Diagnosis present

## 2016-12-17 NOTE — Patient Instructions (Signed)
OT TREATMENT    Neuro muscular re-education  Pt. Worked on grasping 1" resistive cubes using her right hand while the board is placed at a vertical angle. Pt. Removed the cubes with thumb opposition to the tip of her 2nd through 5th digits. Pt. Worked on replacing them isolating each digit to press them in place. Pt. Worked on grasping and placing 2" sticks into a board with her right hand. Pt. worked on removing the sticks with resistive tweezers with emphasis on sustaining hold on the resistive tweezers. Pt. Worked on placing and removing push pins into a cord bulletin board placed at a vertical angle.  Therapeutic Exercise:  Pt. performed gross gripping with grip strengthener. Pt. worked on sustaining grip while grasping pegs and reaching at various heights. Gripper was placed in the resistive slot with the white resistive spring. Pt. Worked on pinch strengthening in the left hand for lateral, and 3pt. pinch using yellow, red, green, and blue resistive clips. Pt. worked on placing the clips at various vertical and horizontal angles. Tactile and verbal cues were required for eliciting the desired movement.  Selfcare:  Manual Therapy:

## 2016-12-17 NOTE — Therapy (Signed)
[facility] MAIN [facility] SERVICES [address] [place], [place], [address] Phone: [phone]   Fax:  [phone]  Physical Therapy Treatment  Patient Details  Name: Michelle Randall MRN: [id number] Date of Birth: Jul 29, 1996 No Data Recorded  Encounter Date: 12/17/2016      PT End of Session - 12/17/16 1039    Visit Number 70   Number of Visits 44   Date for PT Re-Evaluation 01/25/17   Authorization Type medicaid   PT Start Time 1000   PT Stop Time 1045   PT Time Calculation (min) 45 min   Activity Tolerance Patient tolerated treatment well;Patient limited by fatigue   Behavior During Therapy [facility] for tasks assessed/performed      History reviewed. No pertinent past medical history.  History reviewed. No pertinent surgical history.  There were no vitals filed for this visit.      Subjective Assessment - 12/17/16 1052    Subjective Pt states shes doing well and reports she's having difficult with stepping up the stairs   Patient is accompained by: Family member   Limitations Walking   Patient Stated Goals Patient wants to improve her core strength.    Currently in Pain? No/denies      Therapeutic exercise: Step ups onto 4" step with B UE support - x 10 Feet together balancing on blue airex pad - EO, EC x 30sec; Head turns down/up; right/left - x10  Side stepping up and over on airex on blue - x15 Hip abduction on airex pad with UE support - x15  Hip extension in standing at MAT[employer] - x15 2.5# B  Side stepping in  bars - x6 down and back Marching on airex with cueing to activate anterior core - x20 B  Tandem ambulation forward/backward in  bars with B UE support - x3 down and back   Patient requires heavy use of UE support to perform exercises throughout session secondary to decreased balance.         PT Education - 12/17/16 1039    Education provided Yes   Education Details form/technique with exercises    Person(s)  Educated Patient   Methods Explanation;Demonstration   Comprehension Verbalized understanding;Returned demonstration             PT Long Term Goals - 11/02/16 1525      PT LONG TERM GOAL #1   Title Patient will improve Dynamic Gait Index (DGI) score to > 19/24 for low falls risk regarding dynamic walking tasks    Baseline 14/24; 11/02/16: 15/24   Time 12   Period Weeks   Status On-going     PT LONG TERM GOAL #2   Title Patient will increase Berg Balance score by > 6 points to demonstrate decreased fall risk during functional activities    Baseline 11/02/16: 34/56   Time 12   Period Weeks   Status On-going     PT LONG TERM GOAL #3   Title Patient will be able to transfer in and out of a large car with Patient will be able to transfer in and out of a high seated car with CGA    Baseline Patient requires min for transfer into large car   Time 12   Period Weeks   Status On-going     PT LONG TERM GOAL #4   Title Patient will complete a TUG test in < 12 seconds for independent mobility and decreased fall risk    Baseline 11.45  Time 12   Period Weeks   Status Achieved     PT LONG TERM GOAL #5   Title Patient will improve 6 minute walk distance by > 150 ft for improved return to functional community activities    Baseline 740   Time 12   Status On-going     PT LONG TERM GOAL #6   Title Patient will improve gait speed to > 1.2 m/s with least restrictive assistive device to return to normal walking speed    Baseline .76 m/h   Time 12   Period Weeks   Status On-going     PT LONG TERM GOAL #7   Title Patient (< 788 years old) will complete five times sit to stand test in < 10 seconds indicating an increased LE strength and improved balance    Baseline 9.45 sec   Time 12   Period Weeks   Status Achieved     PT LONG TERM GOAL #8   Title Patient will be able to ambulate on inclines and grass independenlty with LRAD   Baseline Patient needs SBA for ambulation in grass  or ueven surfaces   Time 12   Period Weeks   Status On-going     PT LONG TERM GOAL  #9   TITLE Patient will be abe to transfer from low chair or stool wihtout UE support independently   Baseline Patient needs min asssit to transfer from a low stool   Period Weeks   Status Achieved     PT LONG TERM GOAL  #10   TITLE Patient will be able to transfer from the floor to standing independently with use of LRAD   Baseline Patient needs min assist to transfer from the floor to standing   Time 12   Period Weeks   Status On-going               Plan - 12/17/16 1040    Clinical Impression Statement Patient requires B UE support to perform exercises secondary to decreased balance. Patient demonstrates decreased muscular endurance/strength demonstrating fatigue at end of the session. Patient will benefit form further exercises aimed at improving stabilization and endurance to maintain functional capacity.   Rehab Potential Good   Clinical Impairments Affecting Rehab Potential weakness and decreased standing balance   PT Frequency 1x / week   PT Duration 12 weeks   PT Treatment/Interventions Therapeutic exercise;Therapeutic activities;Gait training;Balance training;Stair training;DME Instruction;Neuromuscular re-education;Patient/family education   PT Next Visit Plan Bil knee flexion/extension strengthening; Progress LE, core strengthening and balance   PT Home Exercise Plan Continued from previous sessions.    Consulted and Agree with Plan of Care Patient      Patient will benefit from skilled therapeutic intervention in order to improve the following deficits and impairments:  Abnormal gait, Decreased balance, Decreased endurance, Difficulty walking, Decreased strength  Visit Diagnosis: Muscle weakness (generalized)  Other lack of coordination  Difficulty in walking, not elsewhere classified     Problem List There are no active problems to display for this patient.   Myrene GalasWesley  Paxson Harrower, PT DPT 12/17/2016, 10:53 AM  [facility] MAIN [facility] [address] [place], [place], [address] Phone: [phone]   Fax:  [phone]  Name: Michelle Randall MRN: [id number] Date of Birth: November 16, 1995

## 2016-12-17 NOTE — Therapy (Addendum)
[facility] [address] [place], [place], [address] Phone: [phone]   Fax:  [phone]  Occupational Therapy Treatment  Patient Details  Name: Michelle Randall MRN: [id number] Date of Birth: 26-Oct-1996 No Data Recorded  Encounter Date: 12/17/2016      OT End of Session - 12/17/16 1140    Visit Number 61   Number of Visits 70   Date for OT Re-Evaluation 12/27/16   Authorization Type medicaid visit 40 of 48   OT Start Time 1045   OT Stop Time 1130   OT Time Calculation (min) 45 min   Activity Tolerance Patient tolerated treatment well   Behavior During Therapy WFL for tasks assessed/performed      No past medical history on file.  No past surgical history on file.  There were no vitals filed for this visit.      Subjective Assessment - 12/17/16 1138    Subjective  Pt. reports she is palnning to study abroad next year for a 3 week long course in [employer] [place].   Patient is accompained by: Family member   Patient Stated Goals To return home   Currently in Pain? No/denies                      OT Treatments/Exercises (OP) - 12/17/16 1151      Fine Motor Coordination   Other Fine Motor Exercises Pt. Worked on grasping 1" resistive cubes using her right hand while the board is placed at a vertical angle. Pt. Removed the cubes with thumb opposition to the tip of her 2nd through 5th digits. Pt. Worked on replacing them isolating each digit to press them in place. Pt. Worked on grasping and placing 2" sticks into a board with her right hand. Pt. worked on removing the sticks with resistive tweezers with emphasis on sustaining hold on the resistive tweezers. Pt. Worked on placing and removing push pins into a cord bulletin board placed at a vertical angle.     Neurological Re-education Exercises   Other Exercises 1 Pt. performed gross gripping with grip strengthener. Pt. worked on sustaining grip while  grasping pegs and reaching at various heights. Gripper was placed in the 3rd resistive slot with the white resistive spring. Pt. Worked on pinch strengthening in the left hand for lateral, and 3pt. pinch using yellow, red, green, and blue resistive clips. Pt. worked on placing the clips at various vertical and horizontal angles. Tactile and verbal cues were required for eliciting the desired movement.                OT Education - 12/17/16 1140    Education provided Yes   Education Details Pinch strengthening.   Person(s) Educated Patient   Methods Explanation;Demonstration   Comprehension Verbalized understanding;Returned demonstration             OT Long Term Goals - 12/01/16 1218      OT LONG TERM GOAL #1   Title Patient will demonstrate methods of picking up her bookbag and placing it onto the back of her chair with modified independence to transport items to class.   Baseline requires assistance from others to complete now but will need to be independent when at college in the fall .   Time 6   Period Months   Status Achieved     OT LONG TERM GOAL #2   Title Patient will be able to make her bed  with modified independence.   Baseline she continues to have difficulty with balance and completing this task, increased time.   Time 6   Period Months   Status On-going     OT LONG TERM GOAL #3   Title Patient will demonstrate the ability to perform vacuuming of one room with modified independence using light weight vacuum.   Baseline still requires max assist   Time 6   Period Months   Status On-going     OT LONG TERM GOAL #4   Title Patient will demonstrate the ability to open containers with sealed tops such as on yogurt containers and individual ice cream containers.    Baseline increased difficulty with sealed, peel away container tops and requires assistance or modifications to open   Time 12   Period Weeks   Status On-going     OT LONG TERM GOAL #5   Title  Patient will demonstrate the ability to perform small buttons on shirts independently and with good speed in order to dress for school.   Baseline increased time and mom has to help 50% of the time and if she is under time constraints.    Time 12   Period Weeks   Status On-going     OT LONG TERM GOAL #6   Title Patient will donn and doff small earrings with modified independence.   Baseline max assist from mom, patient has difficulty holding and dropping with attempts.    Time 12   Period Weeks   Status On-going     OT LONG TERM GOAL  #9   Baseline Patient will be able to braid her own hair before college   Time 6   Period Months   Status On-going     OT LONG TERM GOAL  #10   Time 6     OT LONG TERM GOAL  #11   TITLE Will be able to squeeze tooth paste out of the tube when it is less than half full.   Baseline difficulty with small travel sized.   Time 6   Period Months   Status On-going     OT LONG TERM GOAL  #12   TITLE Patient will demonstrate managing wheelchair rain poncho in inclement weather with modified independence.   Baseline crutch tips are difficult for anyone to take them off, goal deferred.   Time 6   Period Months   Status Achieved     OT LONG TERM GOAL  #13   TITLE Patient will be able to complete 2 1/2 minutes straight with left shoulder flexion simulating driving with a stearing wheel ball to be able to drive to college.    Baseline Has not been able to drive in the last few months, mom or dad takes her to and from college each day.   Time 6   Period Months   Status On-going     OT LONG TERM GOAL  #15   TITLE Patient will be able to obtain food tray in the cafeteria from a wheelchair level and take to the table without spilling with modified independence.    Baseline max assist   Time 6   Period Months   Status Achieved               Plan - 12/17/16 1141    Clinical Impression Statement Pt. is making progress overall. Pt. reports having  difficulty squeezing the toothpaste out onto her toothbrush. Pt. continues to work on improving grip  strength, pinch strength, and coordination skills for use during ADLs, IADLS, and school/college related tasks.   Rehab Potential Good   OT Frequency 1x / week   OT Duration 12 weeks   OT Treatment/Interventions Self-care/ADL training;Therapeutic exercise;Therapeutic exercises;Therapeutic activities;DME and/or AE instruction;Patient/family education   Consulted and Agree with Plan of Care Patient      Patient will benefit from skilled therapeutic intervention in order to improve the following deficits and impairments:  Impaired UE functional use, Difficulty walking, Decreased strength, Decreased coordination, Decreased endurance, Decreased balance  Visit Diagnosis: Muscle weakness (generalized)  Other lack of coordination    Problem List There are no active problems to display for this patient.   Olegario Messier, MS, OTR/L 12/17/2016, 12:08 PM  [facility] MAIN [facility] SERVICES [address] [place], [place], [address] Phone: [phone]   Fax:  [phone]  Name: Michelle Randall MRN: [id number] Date of Birth: [birth year]

## 2016-12-21 ENCOUNTER — Encounter: Payer: Medicaid Other | Admitting: Occupational Therapy

## 2016-12-21 ENCOUNTER — Ambulatory Visit: Payer: Medicaid Other | Admitting: Physical Therapy

## 2016-12-24 ENCOUNTER — Ambulatory Visit: Payer: Medicaid Other

## 2016-12-24 ENCOUNTER — Encounter: Payer: Self-pay | Admitting: Occupational Therapy

## 2016-12-24 ENCOUNTER — Ambulatory Visit: Payer: Medicaid Other | Admitting: Occupational Therapy

## 2016-12-24 DIAGNOSIS — R278 Other lack of coordination: Secondary | ICD-10-CM

## 2016-12-24 DIAGNOSIS — M6281 Muscle weakness (generalized): Secondary | ICD-10-CM | POA: Diagnosis not present

## 2016-12-24 DIAGNOSIS — R262 Difficulty in walking, not elsewhere classified: Secondary | ICD-10-CM

## 2016-12-24 NOTE — Therapy (Signed)
[facility] MAIN [facility] SERVICES [address] [place], [place], [address] Phone: [phone]   Fax:  [phone]  Occupational Therapy Treatment/Recertification  Patient Details  Name: Michelle Randall MRN: [id number] Date of Birth: [birth year] No Data Recorded  Encounter Date: 12/24/2016      OT End of Session - 12/24/16 1114    Visit Number 62   Number of Visits 70   Date for OT Re-Evaluation 12/27/16   Authorization Type medicaid visit 41 of 48   OT Start Time 1100   OT Stop Time 1149   OT Time Calculation (min) 49 min   Activity Tolerance Patient tolerated treatment well   Behavior During Therapy [facility] for tasks assessed/performed      History reviewed. No pertinent past medical history.  History reviewed. No pertinent surgical history.  There were no vitals filed for this visit.      Subjective Assessment - 12/24/16 1117    Subjective  Patient reports she has difficulty with putting her rain boots on and zipping them.  She is able to manage the zipper when they are not on her feet but has difficulty with zipping while they are on.     Patient Stated Goals To be as independent as possible at home and at school.    Currently in Pain? No/denies   Pain Score 0-No pain                      OT Treatments/Exercises (OP) - 12/24/16 1436      ADLs   ADL Comments Patient received new leg braces this week and is now able to get them one and able to pull her pants over the top of them and fit into her shoes.  She is able to demonstrate squeezing the toothpaste better especially when the tube is less than 1/2 full.  She has improved ability to open a variety of jars and containers. She is able to demonstrate moving her books from her bookbag to the desk and returning items after she uses them without difficulty.  She continues to demo difficulty with speed of completing self care tasks especially to get to class on time. She has  difficulty with small earrings but is able to perform better with longer, bigger earrings that have attached flip backs.  She is able to perform buttons however, when in a hurry she often misaligns the clothing and has to have her mom redo the buttons so she can get to class. She still requires moderate assistance for bedmaking. With the recent rainy weather, she has difficulty donning her rainboots and zipping the zipper.  She is able to demo managing the zipper when its in her lap but not on her feet.      Fine Motor Coordination   Other Fine Motor Exercises      Neurological Re-education Exercises   Other Exercises 1 Patient performing reaching tasks from sitting position with 1# wrist weight to each wrist, right and left and completing moving looped balls from one level to another for all 4 levels of SAEBO tower, cues for reaching patterns especially at the top level.  Completed multiple reps for multiple sets for each UE.  Resistive pinch tower with both right and left hands with lateral and 3 point pinches to place and remove onto elevated surface with 1# weight on each wrist.  She has difficulty with the most resistive black pinch pins on the left side but  able to complete on the right.                  OT Education - 12/24/16 1445    Education provided Yes   Education Details plan of care, goals, reaching tasks   Person(s) Educated Patient   Methods Explanation;Demonstration;Verbal cues   Comprehension Verbal cues required;Returned demonstration;Verbalized understanding             OT Long Term Goals - 12/24/16 1110      OT LONG TERM GOAL #1   Title Patient will demonstrate methods of picking up her bookbag and placing it onto the back of her chair with modified independence to transport items to class.   Baseline requires assistance from others to complete now but will need to be independent when at college in the fall .   Time 6   Period Months   Status Achieved      OT LONG TERM GOAL #2   Title Patient will be able to make her bed with modified independence.   Baseline she continues to have difficulty with balance and completing this task, increased time.   Time 6   Period Months   Status On-going     OT LONG TERM GOAL #3   Title Patient will demonstrate the ability to perform vacuuming of one room with modified independence using light weight vacuum.   Baseline still requires max assist   Time 6   Period Months   Status On-going     OT LONG TERM GOAL #4   Title Patient will demonstrate the ability to open containers with sealed tops such as on yogurt containers and individual ice cream containers.    Baseline increased difficulty with sealed, peel away container tops and requires assistance or modifications to open at times   Time 12   Period Weeks   Status On-going     OT LONG TERM GOAL #5   Title Patient will demonstrate the ability to perform small buttons on shirts independently and with good speed in order to dress for school.   Baseline getting easier but if rushed she often does the wrong button holes and will have to have mom to redo.    Time 12   Period Weeks   Status On-going     OT LONG TERM GOAL #6   Title Patient will donn and doff small earrings with modified independence.   Baseline mod assist from mom, patient has difficulty holding and dropping with attempts, longer earring are easier.   Time 12   Period Weeks   Status On-going     OT LONG TERM GOAL #7   Title Patient will be able to zip her rainboots while they are on her feet with modified independence.     Baseline can operate zipper when not on feet but has difficulty when putting boots on to zip them.    Time 12   Period Weeks   Status New     OT LONG TERM GOAL #8   Title Patient to complete am self care demonstrating efficient methods of dressing skills to get to school on time.    Baseline often has to have her mom help her to get dressed to get to school on  time.   Time 12   Period Weeks   Status New     OT LONG TERM GOAL  #9   Baseline Patient will be able to braid her own hair before college   Time  6   Period Months   Status Achieved     OT LONG TERM GOAL  #10   Time 6     OT LONG TERM GOAL  #11   TITLE Will be able to squeeze tooth paste out of the tube when it is less than half full.   Baseline difficulty with small travel sized.   Time 6   Period Months   Status On-going     OT LONG TERM GOAL  #12   TITLE Patient will demonstrate managing wheelchair rain poncho in inclement weather with modified independence.   Baseline crutch tips are difficult for anyone to take them off, goal deferred.   Time 6   Period Months   Status Achieved     OT LONG TERM GOAL  #13   TITLE Patient will be able to complete 2 1/2 minutes straight with left shoulder flexion simulating driving with a stearing wheel ball to be able to drive to college.    Baseline Patient has not been driving, her family takes her to school and drops her off.    Time 6   Period Months   Status On-going     OT LONG TERM GOAL  #15   TITLE Patient will be able to obtain food tray in the cafeteria from a wheelchair level and take to the table without spilling with modified independence.    Baseline max assist   Time 6   Period Months   Status Achieved               Plan - 12/24/16 1115    Clinical Impression Statement Patient continues to progress with self care tasks however she is still slow to complete tasks which affects her schedule of getting to school on time.  She has new leg braces that now fit with her shoes and clothing and is able to don and doff.  She would benefit from continued OT to work towards improving speed of dressing, homemakiing tasks such as bed making and vacuuming and fine motor coordination tasks such as managing jewelry and buttons.  Her strength has improved for her to now manage her bookbag without difficulty in the classroom and to  place onto her wheelchair. Recommend patient continue  with  Skilled OT services to improve her safety and independence with daily tasks.     Rehab Potential Good   OT Frequency 1x / week   OT Duration 12 weeks   OT Treatment/Interventions Self-care/ADL training;Therapeutic exercise;Therapeutic exercises;Therapeutic activities;DME and/or AE instruction;Patient/family education   Consulted and Agree with Plan of Care Patient      Patient will benefit from skilled therapeutic intervention in order to improve the following deficits and impairments:  Impaired UE functional use, Difficulty walking, Decreased strength, Decreased coordination, Decreased endurance, Decreased balance  Visit Diagnosis: Muscle weakness (generalized)  Other lack of coordination    Problem List There are no active problems to display for this patient.  Kerrie Buffalo, OTR/L, CLT  Lovett,Amy 12/24/2016, 2:58 PM  [place] [facility] MAIN [facility] SERVICES [address] [place], [place], [address] Phone: [phone]   Fax:  [phone]  Name: Michelle Randall MRN: [id number] Date of Birth: [birth year]

## 2016-12-24 NOTE — Therapy (Signed)
[place] [facility] MAIN [facility] SERVICES [address] [place], [place], [address] Phone: [phone]   Fax:  [phone]  Physical Therapy Treatment  Patient Details  Name: Michelle Randall MRN: [id number] Date of Birth: 13-Oct-1996 No Data Recorded  Encounter Date: 12/24/2016      PT End of Session - 12/24/16 1029    Visit Number 71   Number of Visits 45   Date for PT Re-Evaluation 01/25/17   Authorization Type medicaid   PT Start Time 1005   PT Stop Time 1046   PT Time Calculation (min) 41 min   Activity Tolerance Patient tolerated treatment well;Patient limited by fatigue   Behavior During Therapy [facility] for tasks assessed/performed      History reviewed. No pertinent past medical history.  History reviewed. No pertinent surgical history.  There were no vitals filed for this visit.      Subjective Assessment - 12/24/16 1023    Subjective Patient states going up the stairs is starting to become easier but she's having difficulties with ascending curbs.    Patient is accompained by: Family member   Limitations Walking   Patient Stated Goals Patient wants to improve her core strength.    Currently in Pain? No/denies      Therapeutic exercise: Nustep resistance level 4 - 6 min with use of UE's  Step ups onto 4" step with airex pad and  B UE support - x 10 B Feet together balancing on purple airex pad - EO, EC x 30sec; Head turns down/up; right/left - x10 with intermittent UE support  Wobbleboard slow and control weight shifts - x20 laterally; balancing flat on wobbleboard 2 x 30secs Double airex pad balance side stepping up and over - x10 Marching on airex with cueing to activate anterior core - x20 B  Heel taps off of 4" step B - x 10 with UE support    Patient requires heavy use of UE support to perform exercises throughout session secondary to decreased balance.         PT Education - 12/24/16 1027    Education provided Yes    Education Details Stepping strategies with L LE extremity   Person(s) Educated Patient   Methods Explanation;Demonstration   Comprehension Verbalized understanding;Returned demonstration             PT Long Term Goals - 11/02/16 1525      PT LONG TERM GOAL #1   Title Patient will improve Dynamic Gait Index (DGI) score to > 19/24 for low falls risk regarding dynamic walking tasks    Baseline 14/24; 11/02/16: 15/24   Time 12   Period Weeks   Status On-going     PT LONG TERM GOAL #2   Title Patient will increase Berg Balance score by > 6 points to demonstrate decreased fall risk during functional activities    Baseline 11/02/16: 34/56   Time 12   Period Weeks   Status On-going     PT LONG TERM GOAL #3   Title Patient will be able to transfer in and out of a large car with Patient will be able to transfer in and out of a high seated car with CGA    Baseline Patient requires min for transfer into large car   Time 12   Period Weeks   Status On-going     PT LONG TERM GOAL #4   Title Patient will complete a TUG test in < 12 seconds for independent  mobility and decreased fall risk    Baseline 11.45   Time 12   Period Weeks   Status Achieved     PT LONG TERM GOAL #5   Title Patient will improve 6 minute walk distance by > 150 ft for improved return to functional community activities    Baseline 740   Time 12   Status On-going     PT LONG TERM GOAL #6   Title Patient will improve gait speed to > 1.2 m/s with least restrictive assistive device to return to normal walking speed    Baseline .76 m/h   Time 12   Period Weeks   Status On-going     PT LONG TERM GOAL #7   Title Patient (< 20 years old) will complete five times sit to stand test in < 10 seconds indicating an increased LE strength and improved balance    Baseline 9.45 sec   Time 12   Period Weeks   Status Achieved     PT LONG TERM GOAL #8   Title Patient will be able to ambulate on inclines and grass  independenlty with LRAD   Baseline Patient needs SBA for ambulation in grass or ueven surfaces   Time 12   Period Weeks   Status On-going     PT LONG TERM GOAL  #9   TITLE Patient will be abe to transfer from low chair or stool wihtout UE support independently   Baseline Patient needs min asssit to transfer from a low stool   Period Weeks   Status Achieved     PT LONG TERM GOAL  #10   TITLE Patient will be able to transfer from the floor to standing independently with use of LRAD   Baseline Patient needs min assist to transfer from the floor to standing   Time 12   Period Weeks   Status On-going               Plan - 12/24/16 1057    Clinical Impression Statement Patient tolerates advancement in exercise progression with moderate fatigue noted after performing indicating functional carryover between visits. Although patient is improving, she continues to demonstrate balance difficulties and decreased muscular endurance/strength with movements and will benefit from further skilled therapy to return to prior level of function.    Rehab Potential Good   Clinical Impairments Affecting Rehab Potential weakness and decreased standing balance   PT Frequency 1x / week   PT Duration 12 weeks   PT Treatment/Interventions Therapeutic exercise;Therapeutic activities;Gait training;Balance training;Stair training;DME Instruction;Neuromuscular re-education;Patient/family education   PT Next Visit Plan Bil knee flexion/extension strengthening; Progress LE, core strengthening and balance   PT Home Exercise Plan Continued from previous sessions.    Consulted and Agree with Plan of Care Patient      Patient will benefit from skilled therapeutic intervention in order to improve the following deficits and impairments:  Abnormal gait, Decreased balance, Decreased endurance, Difficulty walking, Decreased strength  Visit Diagnosis: Other lack of coordination  Muscle weakness  (generalized)  Difficulty in walking, not elsewhere classified     Problem List There are no active problems to display for this patient.   Myrene GalasWesley Treva Huyett, PT DPT 12/24/2016, 11:07 AM  [place] [facility] [address] [place], [place], [address] Phone: [phone]   Fax:  [phone]  Name: Michelle Randall MRN: [id number] Date of Birth: 03/15/1996

## 2016-12-28 ENCOUNTER — Ambulatory Visit: Payer: Medicaid Other | Admitting: Physical Therapy

## 2016-12-28 ENCOUNTER — Encounter: Payer: Medicaid Other | Admitting: Occupational Therapy

## 2016-12-31 ENCOUNTER — Ambulatory Visit: Payer: Medicaid Other | Admitting: Occupational Therapy

## 2016-12-31 ENCOUNTER — Ambulatory Visit: Payer: Medicaid Other

## 2017-01-04 ENCOUNTER — Ambulatory Visit: Payer: Medicaid Other | Admitting: Physical Therapy

## 2017-01-04 ENCOUNTER — Encounter: Payer: Medicaid Other | Admitting: Occupational Therapy

## 2017-01-07 ENCOUNTER — Encounter: Payer: Self-pay | Admitting: Occupational Therapy

## 2017-01-07 ENCOUNTER — Ambulatory Visit: Payer: Medicaid Other

## 2017-01-07 ENCOUNTER — Ambulatory Visit: Payer: Medicaid Other | Admitting: Occupational Therapy

## 2017-01-07 DIAGNOSIS — M6281 Muscle weakness (generalized): Secondary | ICD-10-CM

## 2017-01-07 DIAGNOSIS — R278 Other lack of coordination: Secondary | ICD-10-CM

## 2017-01-07 DIAGNOSIS — R262 Difficulty in walking, not elsewhere classified: Secondary | ICD-10-CM

## 2017-01-07 NOTE — Therapy (Signed)
[place] [facility] [address] [place], [place], [address] Phone: [phone]   Fax:  [phone]  Occupational Therapy Treatment  Patient Details  Name: Michelle HuaMisbah N Buzby MRN: [id number] Date of Birth: [birth year] No Data Recorded  Encounter Date: 01/07/2017      OT End of Session - 01/07/17 1028    Visit Number 63   Number of Visits 82   Date for OT Re-Evaluation 03/18/17   Authorization Type medicaid visit 42 of 60   OT Start Time 0915   OT Stop Time 0959   OT Time Calculation (min) 44 min   Activity Tolerance Patient tolerated treatment well   Behavior [facility] assessed/performed      History reviewed. No pertinent past medical history.  History reviewed. No pertinent surgical history.  There were no vitals filed for this visit.      Subjective Assessment - 01/07/17 0921    Subjective  Patient reports she is tired from doing school work until almost midnight last night and from PT session this morning.  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     Patient Stated Goals To be as independent as possible at home and at school.    Currently in Pain? No/denies   Pain Score 0-No pain                      OT Treatments/Exercises (OP) - 01/07/17 1023      Fine Motor Coordination   Other Fine Motor Exercises Patient seen for fine motor coordination skills of manipulation of pieces of [place] with small washers, dowels and collars with cues for assembly, prehension patterns, using the hand for storage and translatory movements of the hand. Manipulation of checkers with right and left hands, holding 4 at a time, moving from fingertips to palm and back and creating a variety of patterns.  Cues provided from therapist for sequence and desired prehension patterns.       Neurological Re-education Exercises   Other Exercises  1 Patient seen for bilateral UE strengthening with 2# dumbell weights in each hand with alternating patterns for shoulder flexion, chest press, ABD, supination and elbow flexion for 10 reps for 2 sets each.  Cues for form and technique.                  OT Education - 01/07/17 1027    Education provided Yes   Education Details translatory movements of the hand and using the hand for storage.    Person(s) Educated Patient   Methods Explanation;Demonstration;Verbal cues   Comprehension Verbal cues required;Returned demonstration;Verbalized understanding             OT Long Term Goals - 12/24/16 1110      OT LONG TERM GOAL #1   Title Patient will demonstrate methods of picking up her bookbag and placing it onto the back of her chair with modified independence to transport items to class.   Baseline requires assistance from others to complete now but will need to be independent when at college in the fall .   Time 6   Period Months   Status Achieved     OT LONG TERM GOAL #2   Title Patient will be able to make her bed with modified independence.   Baseline she continues to have difficulty with balance and completing this task,  increased time.   Time 6   Period Months   Status On-going     OT LONG TERM GOAL #3   Title Patient will demonstrate the ability to perform vacuuming of one room with modified independence using light weight vacuum.   Baseline still requires max assist   Time 6   Period Months   Status On-going     OT LONG TERM GOAL #4   Title Patient will demonstrate the ability to open containers with sealed tops such as on yogurt containers and individual ice cream containers.    Baseline increased difficulty with sealed, peel away container tops and requires assistance or modifications to open at times   Time 12   Period Weeks   Status On-going     OT LONG TERM GOAL #5   Title Patient will demonstrate the ability to perform small buttons on shirts  independently and with good speed in order to dress for school.   Baseline getting easier but if rushed she often does the wrong button holes and will have to have mom to redo.    Time 12   Period Weeks   Status On-going     OT LONG TERM GOAL #6   Title Patient will donn and doff small earrings with modified independence.   Baseline mod assist from mom, patient has difficulty holding and dropping with attempts, longer earring are easier.   Time 12   Period Weeks   Status On-going     OT LONG TERM GOAL #7   Title Patient will be able to zip her rainboots while they are on her feet with modified independence.     Baseline can operate zipper when not on feet but has difficulty when putting boots on to zip them.    Time 12   Period Weeks   Status New     OT LONG TERM GOAL #8   Title Patient to complete am self care demonstrating efficient methods of dressing skills to get to school on time.    Baseline often has to have her mom help her to get dressed to get to school on time.   Time 12   Period Weeks   Status New     OT LONG TERM GOAL  #9   Baseline Patient will be able to braid her own hair before college   Time 6   Period Months   Status Achieved     OT LONG TERM GOAL  #10   Time 6     OT LONG TERM GOAL  #11   TITLE Will be able to squeeze tooth paste out of the tube when it is less than half full.   Baseline difficulty with small travel sized.   Time 6   Period Months   Status On-going     OT LONG TERM GOAL  #12   TITLE Patient will demonstrate managing wheelchair rain poncho in inclement weather with modified independence.   Baseline crutch tips are difficult for anyone to take them off, goal deferred.   Time 6   Period Months   Status Achieved     OT LONG TERM GOAL  #13   TITLE Patient will be able to complete 2 1/2 minutes straight with left shoulder flexion simulating driving with a stearing wheel ball to be able to drive to college.    Baseline Patient has not  been driving, her family takes her to school and drops her off.    Time 6  Period Months   Status On-going     OT LONG TERM GOAL  #15   TITLE Patient will be able to obtain food tray in the cafeteria from a wheelchair level and take to the table without spilling with modified independence.    Baseline max assist   Time 6   Period Months   Status Achieved               Plan - 01/07/17 1030    Clinical Impression Statement Focused on fine motor coordination tasks this date with emphasis on prehension patterns, using the hand for storage and translatory movements of the hand.  Patient requires cues for technique and is still working on speed and dexterity of both hands to improve coordination for handwriting, typing and manipulation of small objects during self care and IADL tasks.    Rehab Potential Good   OT Frequency 1x / week   OT Duration 12 weeks   OT Treatment/Interventions Self-care/ADL training;Therapeutic exercise;Therapeutic exercises;Therapeutic activities;DME and/or AE instruction;Patient/family education   Consulted and Agree with Plan of Care Patient      Patient will benefit from skilled therapeutic intervention in order to improve the following deficits and impairments:  Impaired UE functional use, Difficulty walking, Decreased strength, Decreased coordination, Decreased endurance, Decreased balance  Visit Diagnosis: Other lack of coordination  Muscle weakness (generalized)    Problem List There are no active problems to display for this patient.  Kerrie Buffalo, OTR/L, CLT  Maedell Hedger 01/07/2017, 10:32 AM  [place] [facility] MAIN [facility] SERVICES [address] [place], [place], [address] Phone: [phone]   Fax:  [phone]  Name: TINISHA ETZKORN MRN: [id number] Date of Birth: 02-Nov-1996

## 2017-01-07 NOTE — Therapy (Signed)
[place] [facility] [address] [place], [place], [address] Phone: [phone]   Fax:  [phone]  Physical Therapy Treatment  Patient Details  Name: Michelle Randall MRN: [id number] Date of Birth: 1995-12-02 No Data Recorded  Encounter Date: 01/07/2017      PT End of Session - 01/07/17 0840    Visit Number 72   Number of Visits 45   Date for PT Re-Evaluation 01/25/17   Authorization Type medicaid   PT Start Time [phone]   PT Stop Time 0900   PT Time Calculation (min) 44 min   Activity Tolerance Patient tolerated treatment well;Patient limited by fatigue   Behavior [facility] Therapy [facility] for tasks assessed/performed      History reviewed. No pertinent past medical history.  History reviewed. No pertinent surgical history.  There were no vitals filed for this visit.      Subjective Assessment - 01/07/17 0825    Subjective Patient reports she would like to improve her core strength and stabilization.    Patient is accompained by: Family member   Limitations Walking   Patient Stated Goals Patient wants to improve her core strength.    Currently in Pain? No/denies      TREATMENT: Therapeutic Exercise: Guernsey[place]ian twist in long sitting - 2x20 B 4# ball Planks in prone on elbows - 3 x 25sec Sidelying bridges on mat table - 2 x 25sec B Sitting trunk extension against GTB - 2 x 15 Nustep level 4 - 5 min with cueing on speed and performance Forward step ups onto 6" step - x10 B Side stepping up and over bosu ball - x 10 up and over Wobbleboard balancing with intermittent UE support - x343min; Lateral weight shifts in standing x 20 B      PT Education - 01/07/17 0836    Education provided Yes   Education Details Importance of HEP performance at home    Person(s) Educated Patient   Methods Explanation;Demonstration   Comprehension Verbalized understanding;Returned demonstration             PT Long Term Goals - 11/02/16  1525      PT LONG TERM GOAL #1   Title Patient will improve Dynamic Gait Index (DGI) score to > 19/24 for low falls risk regarding dynamic walking tasks    Baseline 14/24; 11/02/16: 15/24   Time 12   Period Weeks   Status On-going     PT LONG TERM GOAL #2   Title Patient will increase Berg Balance score by > 6 points to demonstrate decreased fall risk during functional activities    Baseline 11/02/16: 34/56   Time 12   Period Weeks   Status On-going     PT LONG TERM GOAL #3   Title Patient will be able to transfer in and out of a large car with Patient will be able to transfer in and out of a high seated car with CGA    Baseline Patient requires min for transfer into large car   Time 12   Period Weeks   Status On-going     PT LONG TERM GOAL #4   Title Patient will complete a TUG test in < 12 seconds for independent mobility and decreased fall risk    Baseline 11.45   Time 12   Period Weeks   Status Achieved     PT LONG TERM GOAL #5   Title Patient will improve 6 minute walk distance by >  150 ft for improved return to functional community activities    Baseline 740   Time 12   Status On-going     PT LONG TERM GOAL #6   Title Patient will improve gait speed to > 1.2 m/s with least restrictive assistive device to return to normal walking speed    Baseline .76 m/h   Time 12   Period Weeks   Status On-going     PT LONG TERM GOAL #7   Title Patient (< 42 years old) will complete five times sit to stand test in < 10 seconds indicating an increased LE strength and improved balance    Baseline 9.45 sec   Time 12   Period Weeks   Status Achieved     PT LONG TERM GOAL #8   Title Patient will be able to ambulate on inclines and grass independenlty with LRAD   Baseline Patient needs SBA for ambulation in grass or ueven surfaces   Time 12   Period Weeks   Status On-going     PT LONG TERM GOAL  #9   TITLE Patient will be abe to transfer from low chair or stool wihtout UE  support independently   Baseline Patient needs min asssit to transfer from a low stool   Period Weeks   Status Achieved     PT LONG TERM GOAL  #10   TITLE Patient will be able to transfer from the floor to standing independently with use of LRAD   Baseline Patient needs min assist to transfer from the floor to standing   Time 12   Period Weeks   Status On-going               Plan - 01/07/17 4098    Clinical Impression Statement Performed core stabilization exercises today as patient requires her core to progress weight forward when ambulating. Patient demonstrates moderate fatigue after performing standing and core exercises indicating decreased muscular endurance/strength and patient will benefit from further skilled therapy to return to prior level of function.    Rehab Potential Good   Clinical Impairments Affecting Rehab Potential weakness and decreased standing balance   PT Frequency 1x / week   PT Duration 12 weeks   PT Treatment/Interventions Therapeutic exercise;Therapeutic activities;Gait training;Balance training;Stair training;DME Instruction;Neuromuscular re-education;Patient/family education   PT Next Visit Plan Bil knee flexion/extension strengthening; Progress LE, core strengthening and balance   PT Home Exercise Plan Continued from previous sessions.    Consulted and Agree with Plan of Care Patient      Patient will benefit from skilled therapeutic intervention in order to improve the following deficits and impairments:  Abnormal gait, Decreased balance, Decreased endurance, Difficulty walking, Decreased strength  Visit Diagnosis: Other lack of coordination  Muscle weakness (generalized)  Difficulty in walking, not elsewhere classified     Problem List There are no active problems to display for this patient.   Myrene Galas, PT DPT 01/07/2017, 8:59 AM  [place] [facility] MAIN [facility] SERVICES [address]  [place], [place], [address] Phone: [phone]   Fax:  [phone]  Name: Michelle Randall MRN: [id number] Date of Birth: 29-Jan-1996

## 2017-01-11 ENCOUNTER — Ambulatory Visit: Payer: Medicaid Other | Admitting: Physical Therapy

## 2017-01-11 ENCOUNTER — Encounter: Payer: Medicaid Other | Admitting: Occupational Therapy

## 2017-01-14 ENCOUNTER — Encounter: Payer: Medicaid Other | Admitting: Occupational Therapy

## 2017-01-14 ENCOUNTER — Encounter: Payer: Self-pay | Admitting: Occupational Therapy

## 2017-01-14 ENCOUNTER — Ambulatory Visit: Payer: Medicaid Other

## 2017-01-14 ENCOUNTER — Ambulatory Visit: Payer: Medicaid Other | Attending: Pediatrics | Admitting: Physical Therapy

## 2017-01-14 ENCOUNTER — Ambulatory Visit: Payer: Medicaid Other | Admitting: Occupational Therapy

## 2017-01-14 ENCOUNTER — Encounter: Payer: Self-pay | Admitting: Physical Therapy

## 2017-01-14 DIAGNOSIS — M6281 Muscle weakness (generalized): Secondary | ICD-10-CM | POA: Diagnosis present

## 2017-01-14 DIAGNOSIS — R262 Difficulty in walking, not elsewhere classified: Secondary | ICD-10-CM | POA: Diagnosis present

## 2017-01-14 DIAGNOSIS — R278 Other lack of coordination: Secondary | ICD-10-CM | POA: Insufficient documentation

## 2017-01-14 NOTE — Therapy (Signed)
[facility] [address] [place], [place], [address] Phone: [phone]   Fax:  [phone]  Physical Therapy Treatment  Patient Details  Name: Michelle Randall Dibenedetto MRN: [id number] Date of Birth: 10-08-1996 No Data Recorded  Encounter Date: 01/14/2017      PT End of Session - 01/14/17 1003    Visit Number 73   Number of Visits 45   Date for PT Re-Evaluation 01/25/17   Authorization Type medicaid   PT Start Time 1001   PT Stop Time 1045   PT Time Calculation (min) 44 min   Equipment Utilized During Treatment Gait belt   Activity Tolerance Patient tolerated treatment well;Patient limited by fatigue   Behavior [facility] Therapy [facility] for tasks assessed/performed      History reviewed. No pertinent past medical history.  History reviewed. No pertinent surgical history.  There were no vitals filed for this visit.      Subjective Assessment - 01/14/17 1009    Subjective Patient reports she would like to continue to improve her core strength and stabilization.  No complaints or concerns.   Patient is accompained by: Family member   Limitations Walking   Patient Stated Goals Patient wants to improve her core strength.    Currently in Pain? No/denies       TREATMENT   Neuromuscular Re-ed:  Tandem walking on airex pad x4 lengths in // bars with 1 UE support  Sideways walking on airex pad x4 lengths in // bars with BUE support. Cues for upright posture and forward gaze.  Standing on airex pad and shooting balls into basket ~10 feet away, 10 balls in rhomberg stance and 10 balls in tandem with R foot forward   Therapeutic Exercise:  Forward lunges on BOSU ball x10 each LE  Squats in // bars down to tap chair and back up with BUE support and cues to keep knees apart to prevent knee valgus. Pt reports fatigue toward end of each set. 2x15  Penguin crunches in hooklying 3x10 each side, pt rates as med>hard difficulty  Abdominal  twists sitting edge of mat table to L and R with GTB 2x10 each direction with fatigue toward end of each set.              PT Education - 01/14/17 1002    Education provided Yes   Education Details Exercise technique; cues for core activation   Person(s) Educated Patient   Methods Explanation;Demonstration;Verbal cues   Comprehension Verbalized understanding;Returned demonstration;Need further instruction             PT Long Term Goals - 11/02/16 1525      PT LONG TERM GOAL #1   Title Patient will improve Dynamic Gait Index (DGI) score to > 19/24 for low falls risk regarding dynamic walking tasks    Baseline 14/24; 11/02/16: 15/24   Time 12   Period Weeks   Status On-going     PT LONG TERM GOAL #2   Title Patient will increase Berg Balance score by > 6 points to demonstrate decreased fall risk during functional activities    Baseline 11/02/16: 34/56   Time 12   Period Weeks   Status On-going     PT LONG TERM GOAL #3   Title Patient will be able to transfer in and out of a large car with Patient will be able to transfer in and out of a high seated car with CGA    Baseline Patient  requires min for transfer into large car   Time 12   Period Weeks   Status On-going     PT LONG TERM GOAL #4   Title Patient will complete a TUG test in < 12 seconds for independent mobility and decreased fall risk    Baseline 11.45   Time 12   Period Weeks   Status Achieved     PT LONG TERM GOAL #5   Title Patient will improve 6 minute walk distance by > 150 ft for improved return to functional community activities    Baseline 740   Time 12   Status On-going     PT LONG TERM GOAL #6   Title Patient will improve gait speed to > 1.2 m/s with least restrictive assistive device to return to normal walking speed    Baseline .76 m/h   Time 12   Period Weeks   Status On-going     PT LONG TERM GOAL #7   Title Patient (< 39 years old) will complete five times sit to stand test in <  10 seconds indicating an increased LE strength and improved balance    Baseline 9.45 sec   Time 12   Period Weeks   Status Achieved     PT LONG TERM GOAL #8   Title Patient will be able to ambulate on inclines and grass independenlty with LRAD   Baseline Patient needs SBA for ambulation in grass or ueven surfaces   Time 12   Period Weeks   Status On-going     PT LONG TERM GOAL  #9   TITLE Patient will be abe to transfer from low chair or stool wihtout UE support independently   Baseline Patient needs min asssit to transfer from a low stool   Period Weeks   Status Achieved     PT LONG TERM GOAL  #10   TITLE Patient will be able to transfer from the floor to standing independently with use of LRAD   Baseline Patient needs min assist to transfer from the floor to standing   Time 12   Period Weeks   Status On-going               Plan - 01/14/17 1136    Clinical Impression Statement Pt demonstrated fatigue with squatting exercise and abdominal strengthening exercises but tolerated all interventions well this date.  She will benefit from continued skilled PT interventions for improved strength and functional ability.   Rehab Potential Good   Clinical Impairments Affecting Rehab Potential weakness and decreased standing balance   PT Frequency 1x / week   PT Duration 12 weeks   PT Treatment/Interventions Therapeutic exercise;Therapeutic activities;Gait training;Balance training;Stair training;DME Instruction;Neuromuscular re-education;Patient/family education   PT Next Visit Plan Bil knee flexion/extension strengthening; Progress LE, core strengthening and balance   PT Home Exercise Plan Continued from previous sessions.    Consulted and Agree with Plan of Care Patient      Patient will benefit from skilled therapeutic intervention in order to improve the following deficits and impairments:  Abnormal gait, Decreased balance, Decreased endurance, Difficulty walking, Decreased  strength  Visit Diagnosis: Other lack of coordination  Muscle weakness (generalized)  Difficulty in walking, not elsewhere classified     Problem List There are no active problems to display for this patient.   Encarnacion Chu PT, DPT 01/14/2017, 11:39 AM  [facility] MAIN [facility] SERVICES [address] [place], [place], [address] Phone: [phone]  Fax:  [phone]  Name: Michelle Randall Andal MRN: [id number] Date of Birth: 10/08/1996

## 2017-01-15 NOTE — Therapy (Signed)
[facility] [address] [place], [place], [address] Phone: [phone]   Fax:  [phone]  Occupational Therapy Treatment  Patient Details  Name: Michelle HuaMisbah N Trulock MRN: [id number] Date of Birth: [birth year] No Data Recorded  Encounter Date: 01/14/2017      OT End of Session - 01/14/17 1056    Visit Number 64   Number of Visits 82   Date for OT Re-Evaluation 03/18/17   Authorization Type medicaid visit 43 of 60   OT Start Time 1045   OT Stop Time 1131   OT Time Calculation (min) 46 min   Activity Tolerance Patient tolerated treatment well   Behavior During Therapy WFL for tasks assessed/performed      History reviewed. No pertinent past medical history.  History reviewed. No pertinent surgical history.  There were no vitals filed for this visit.      Subjective Assessment - 01/14/17 1054    Subjective  Patient reports she has a busy weekend, [place] [employer] [place] [employer] [place], tomorrow has a wedding shower and [place].  She does not have class on Fridays this semester.    Patient is accompained by: Family member   Patient Stated Goals To be as independent as possible at home and at school.    Currently in Pain? No/denies   Pain Score 0-No pain                      OT Treatments/Exercises (OP) - 01/15/17 1307      Fine Motor Coordination   Other Fine Motor Exercises Patient seen for fine motor coordination activities with manipulation of small marbles, picking up from tabletop, moving from fingertips to palm and then back to fingertips to place into container, cues for prehension patterns and translatory movements of the hand.  Manipulation of 1/2 inch sized small pegs from resistive putty and placing into grid using thumb/index finger.       Neurological Re-education Exercises   Other Exercises 1 Patient seen for resistive wrist exercises with  velcro board for wrist flexion/extension, key pinch and finger extension for 5 reps up and back with cues for technique. 1# wrist weight to right and left hands and completing 4 levels of shape tower for multilevel reach and strengthening task.  Performed in sitting.                  OT Education - 01/14/17 1055    Education provided Yes   Education Details fine motor coordination tasks with small objects   Person(s) Educated Patient   Methods Explanation;Demonstration;Verbal cues   Comprehension Verbal cues required;Returned demonstration;Verbalized understanding             OT Long Term Goals - 12/24/16 1110      OT LONG TERM GOAL #1   Title Patient will demonstrate methods of picking up her bookbag and placing it onto the back of her chair with modified independence to transport items to class.   Baseline requires assistance from others to complete now but will need to be independent when at college in the fall .   Time 6   Period Months   Status Achieved     OT LONG TERM GOAL #2   Title Patient will be able to make her bed with modified independence.   Baseline she continues to have difficulty with balance and completing this task,  increased time.   Time 6   Period Months   Status On-going     OT LONG TERM GOAL #3   Title Patient will demonstrate the ability to perform vacuuming of one room with modified independence using light weight vacuum.   Baseline still requires max assist   Time 6   Period Months   Status On-going     OT LONG TERM GOAL #4   Title Patient will demonstrate the ability to open containers with sealed tops such as on yogurt containers and individual ice cream containers.    Baseline increased difficulty with sealed, peel away container tops and requires assistance or modifications to open at times   Time 12   Period Weeks   Status On-going     OT LONG TERM GOAL #5   Title Patient will demonstrate the ability to perform small buttons on  shirts independently and with good speed in order to dress for school.   Baseline getting easier but if rushed she often does the wrong button holes and will have to have mom to redo.    Time 12   Period Weeks   Status On-going     OT LONG TERM GOAL #6   Title Patient will donn and doff small earrings with modified independence.   Baseline mod assist from mom, patient has difficulty holding and dropping with attempts, longer earring are easier.   Time 12   Period Weeks   Status On-going     OT LONG TERM GOAL #7   Title Patient will be able to zip her rainboots while they are on her feet with modified independence.     Baseline can operate zipper when not on feet but has difficulty when putting boots on to zip them.    Time 12   Period Weeks   Status New     OT LONG TERM GOAL #8   Title Patient to complete am self care demonstrating efficient methods of dressing skills to get to school on time.    Baseline often has to have her mom help her to get dressed to get to school on time.   Time 12   Period Weeks   Status New     OT LONG TERM GOAL  #9   Baseline Patient will be able to braid her own hair before college   Time 6   Period Months   Status Achieved     OT LONG TERM GOAL  #10   Time 6     OT LONG TERM GOAL  #11   TITLE Will be able to squeeze tooth paste out of the tube when it is less than half full.   Baseline difficulty with small travel sized.   Time 6   Period Months   Status On-going     OT LONG TERM GOAL  #12   TITLE Patient will demonstrate managing wheelchair rain poncho in inclement weather with modified independence.   Baseline crutch tips are difficult for anyone to take them off, goal deferred.   Time 6   Period Months   Status Achieved     OT LONG TERM GOAL  #13   TITLE Patient will be able to complete 2 1/2 minutes straight with left shoulder flexion simulating driving with a stearing wheel ball to be able to drive to college.    Baseline Patient  has not been driving, her family takes her to school and drops her off.    Time 6  Period Months   Status On-going     OT LONG TERM GOAL  #15   TITLE Patient will be able to obtain food tray in the cafeteria from a wheelchair level and take to the table without spilling with modified independence.    Baseline max assist   Time 6   Period Months   Status Achieved               Plan - 01/14/17 1056    Clinical Impression Statement Patient continues to progress in all areas, strength improving to manage book bag, books and computer at school.  Coordination improved for manipulation of small objects and working towards improved speed and dexterity.     Rehab Potential Good   OT Frequency 1x / week   OT Duration 12 weeks   OT Treatment/Interventions Self-care/ADL training;Therapeutic exercise;Therapeutic exercises;Therapeutic activities;DME and/or AE instruction;Patient/family education   Consulted and Agree with Plan of Care Patient      Patient will benefit from skilled therapeutic intervention in order to improve the following deficits and impairments:  Impaired UE functional use, Difficulty walking, Decreased strength, Decreased coordination, Decreased endurance, Decreased balance  Visit Diagnosis: Muscle weakness (generalized)  Other lack of coordination    Problem List There are no active problems to display for this patient.  Kerrie Buffalo, OTR/L, CLT  [place] 01/15/2017, 1:12 PM  [facility] MAIN [facility] SERVICES [address] [place], [place], [address] Phone: [phone]   Fax:  [phone]  Name: AMBUR PROVINCE MRN: [id number] Date of Birth: Jul 21, 1996

## 2017-01-18 ENCOUNTER — Encounter: Payer: Medicaid Other | Admitting: Occupational Therapy

## 2017-01-18 ENCOUNTER — Ambulatory Visit: Payer: Medicaid Other | Admitting: Physical Therapy

## 2017-01-19 ENCOUNTER — Ambulatory Visit: Payer: Medicaid Other | Admitting: Physical Therapy

## 2017-01-19 ENCOUNTER — Encounter: Payer: Medicaid Other | Admitting: Occupational Therapy

## 2017-01-21 ENCOUNTER — Ambulatory Visit: Payer: Medicaid Other

## 2017-01-21 ENCOUNTER — Ambulatory Visit: Payer: Medicaid Other | Admitting: Occupational Therapy

## 2017-01-21 ENCOUNTER — Encounter: Payer: Self-pay | Admitting: Occupational Therapy

## 2017-01-21 DIAGNOSIS — M6281 Muscle weakness (generalized): Secondary | ICD-10-CM

## 2017-01-21 DIAGNOSIS — R278 Other lack of coordination: Secondary | ICD-10-CM

## 2017-01-21 DIAGNOSIS — R262 Difficulty in walking, not elsewhere classified: Secondary | ICD-10-CM

## 2017-01-21 NOTE — Therapy (Signed)
[facility] [address] [place], [place], [address] Phone: [phone]   Fax:  [phone]  Physical Therapy Treatment  Patient Details  Name: Michelle HuaMisbah N Henningsen MRN: [id number] Date of Birth: [birth year] No Data Recorded  Encounter Date: 01/21/2017      PT End of Session - 01/21/17 1109    Visit Number 74   Number of Visits 45   Date for PT Re-Evaluation 01/25/17   Authorization Type medicaid   PT Start Time 1015   PT Stop Time 1104   PT Time Calculation (min) 49 min   Equipment Utilized During Treatment Gait belt   Activity Tolerance Patient tolerated treatment well;Patient limited by fatigue   Behavior During Therapy [facility] for tasks assessed/performed      History reviewed. No pertinent past medical history.  History reviewed. No pertinent surgical history.  There were no vitals filed for this visit.      Subjective Assessment - 01/21/17 1101    Subjective Patient reports she would like to wokr on abdominals and    Patient is accompained by: Family member   Limitations Walking   Patient Stated Goals Patient wants to improve her core strength.    Currently in Pain? No/denies   Pain Score 0-No pain      TREATMENT  Therapeutic Exercise Nustep level 4 - 5 min with cueing on speed and performance Tandem walking forward/backwards on airex pad x4 lengths in // bars with 1 UE support  Sideways walking on airex pad x4 lengths in // bars with BUE support. Cues for upright posture and forward gaze.  Wobbleboard balancing with lateral taps bilaterally - x20 B Squats in // bars down to tap chair and back up with BUE support and cues to keep knees apart to prevent knee valgus -- 2x15  Russian twist in hooklying - 2x20 B 4# ball Penguin crunches in hooklying - 2 x 15  Planks in prone on elbows - 3 x 30sec      PT Education - 01/21/17 1103    Education provided Yes   Education Details Form/technique, decreasing knee  valgus with exercise   Person(s) Educated Patient   Methods Explanation;Demonstration   Comprehension Verbalized understanding;Returned demonstration             PT Long Term Goals - 11/02/16 1525      PT LONG TERM GOAL #1   Title Patient will improve Dynamic Gait Index (DGI) score to > 19/24 for low falls risk regarding dynamic walking tasks    Baseline 14/24; 11/02/16: 15/24   Time 12   Period Weeks   Status On-going     PT LONG TERM GOAL #2   Title Patient will increase Berg Balance score by > 6 points to demonstrate decreased fall risk during functional activities    Baseline 11/02/16: 34/56   Time 12   Period Weeks   Status On-going     PT LONG TERM GOAL #3   Title Patient will be able to transfer in and out of a large car with Patient will be able to transfer in and out of a high seated car with CGA    Baseline Patient requires min for transfer into large car   Time 12   Period Weeks   Status On-going     PT LONG TERM GOAL #4   Title Patient will complete a TUG test in < 12 seconds for independent mobility and decreased fall risk  Baseline 11.45   Time 12   Period Weeks   Status Achieved     PT LONG TERM GOAL #5   Title Patient will improve 6 minute walk distance by > 150 ft for improved return to functional community activities    Baseline 740   Time 12   Status On-going     PT LONG TERM GOAL #6   Title Patient will improve gait speed to > 1.2 m/s with least restrictive assistive device to return to normal walking speed    Baseline .76 m/h   Time 12   Period Weeks   Status On-going     PT LONG TERM GOAL #7   Title Patient (< 58 years old) will complete five times sit to stand test in < 10 seconds indicating an increased LE strength and improved balance    Baseline 9.45 sec   Time 12   Period Weeks   Status Achieved     PT LONG TERM GOAL #8   Title Patient will be able to ambulate on inclines and grass independenlty with LRAD   Baseline Patient  needs SBA for ambulation in grass or ueven surfaces   Time 12   Period Weeks   Status On-going     PT LONG TERM GOAL  #9   TITLE Patient will be abe to transfer from low chair or stool wihtout UE support independently   Baseline Patient needs min asssit to transfer from a low stool   Period Weeks   Status Achieved     PT LONG TERM GOAL  #10   TITLE Patient will be able to transfer from the floor to standing independently with use of LRAD   Baseline Patient needs min assist to transfer from the floor to standing   Time 12   Period Weeks   Status On-going               Plan - 01/21/17 1138    Clinical Impression Statement Patient demonstrates increased fatigue with standing LE strengthening exercises and core exercises indicating decreased muscular endurance/strength. Patient will benefit from further skilled therapy focused on improving strength, balance and endurance to return to prior level of function.    Rehab Potential Good   Clinical Impairments Affecting Rehab Potential weakness and decreased standing balance   PT Frequency 1x / week   PT Duration 12 weeks   PT Treatment/Interventions Therapeutic exercise;Therapeutic activities;Gait training;Balance training;Stair training;DME Instruction;Neuromuscular re-education;Patient/family education   PT Next Visit Plan Bil knee flexion/extension strengthening; Progress LE, core strengthening and balance   PT Home Exercise Plan Continued from previous sessions.    Consulted and Agree with Plan of Care Patient      Patient will benefit from skilled therapeutic intervention in order to improve the following deficits and impairments:  Abnormal gait, Decreased balance, Decreased endurance, Difficulty walking, Decreased strength  Visit Diagnosis: Muscle weakness (generalized)  Other lack of coordination  Difficulty in walking, not elsewhere classified     Problem List There are no active problems to display for this  patient.   Myrene Galas, PT DPT 01/21/2017, 11:41 AM  [facility] MAIN [facility] SERVICES [address] [place], [place], [address] Phone: [phone]   Fax:  [phone]  Name: ABAGAEL KRAMM MRN: [id number] Date of Birth: 1996/01/10

## 2017-01-22 NOTE — Therapy (Signed)
[facility] MAIN [facility] SERVICES [address] [place], [place], [address] Phone: [phone]   Fax:  [phone]  Occupational Therapy Treatment  Patient Details  Name: Michelle Randall MRN: [id number] Date of Birth: 1996/02/27 No Data Recorded  Encounter Date: 01/21/2017      OT End of Session - 01/21/17 0952    Visit Number 65   Number of Visits 82   Date for OT Re-Evaluation 03/18/17   Authorization Type medicaid visit 44 of 60   OT Start Time 0940   OT Stop Time 1015   OT Time Calculation (min) 35 min   Activity Tolerance Patient tolerated treatment well   Behavior During Therapy [facility] for tasks assessed/performed      History reviewed. No pertinent past medical history.  History reviewed. No pertinent surgical history.  There were no vitals filed for this visit.      Subjective Assessment - 01/21/17 0951    Subjective  Patient reports she is going to a wedding this weekend in [place] with her family.    Patient is accompained by: Family member   Patient Stated Goals To be as independent as possible at home and at school.    Currently in Pain? No/denies                      OT Treatments/Exercises (OP) - 01/22/17 1416      Fine Motor Coordination   Other Fine Motor Exercises Patient seen for fine motor coordination activities with manipulation of glass beads, picking up from tabletop, moving from fingertips to palm and then back to fingertips to place into container located in a elevated plane of motion to encourage reach, cues for prehension patterns and translatory movements of the hand.      Neurological Re-education Exercises   Other Exercises 1 Patient participating in bilateral strengthening exercises with green putty for gross grip, lateral pinch, 3 point pinch, 2 point pinch with cues for form and technique.                       OT Long Term Goals - 12/24/16 1110      OT LONG TERM GOAL  #1   Title Patient will demonstrate methods of picking up her bookbag and placing it onto the back of her chair with modified independence to transport items to class.   Baseline requires assistance from others to complete now but will need to be independent when at college in the fall .   Time 6   Period Months   Status Achieved     OT LONG TERM GOAL #2   Title Patient will be able to make her bed with modified independence.   Baseline she continues to have difficulty with balance and completing this task, increased time.   Time 6   Period Months   Status On-going     OT LONG TERM GOAL #3   Title Patient will demonstrate the ability to perform vacuuming of one room with modified independence using light weight vacuum.   Baseline still requires max assist   Time 6   Period Months   Status On-going     OT LONG TERM GOAL #4   Title Patient will demonstrate the ability to open containers with sealed tops such as on yogurt containers and individual ice cream containers.    Baseline increased difficulty with sealed, peel away container tops and requires assistance or modifications to  open at times   Time 12   Period Weeks   Status On-going     OT LONG TERM GOAL #5   Title Patient will demonstrate the ability to perform small buttons on shirts independently and with good speed in order to dress for school.   Baseline getting easier but if rushed she often does the wrong button holes and will have to have mom to redo.    Time 12   Period Weeks   Status On-going     OT LONG TERM GOAL #6   Title Patient will donn and doff small earrings with modified independence.   Baseline mod assist from mom, patient has difficulty holding and dropping with attempts, longer earring are easier.   Time 12   Period Weeks   Status On-going     OT LONG TERM GOAL #7   Title Patient will be able to zip her rainboots while they are on her feet with modified independence.     Baseline can operate zipper  when not on feet but has difficulty when putting boots on to zip them.    Time 12   Period Weeks   Status New     OT LONG TERM GOAL #8   Title Patient to complete am self care demonstrating efficient methods of dressing skills to get to school on time.    Baseline often has to have her mom help her to get dressed to get to school on time.   Time 12   Period Weeks   Status New     OT LONG TERM GOAL  #9   Baseline Patient will be able to braid her own hair before college   Time 6   Period Months   Status Achieved     OT LONG TERM GOAL  #10   Time 6     OT LONG TERM GOAL  #11   TITLE Will be able to squeeze tooth paste out of the tube when it is less than half full.   Baseline difficulty with small travel sized.   Time 6   Period Months   Status On-going     OT LONG TERM GOAL  #12   TITLE Patient will demonstrate managing wheelchair rain poncho in inclement weather with modified independence.   Baseline crutch tips are difficult for anyone to take them off, goal deferred.   Time 6   Period Months   Status Achieved     OT LONG TERM GOAL  #13   TITLE Patient will be able to complete 2 1/2 minutes straight with left shoulder flexion simulating driving with a stearing wheel ball to be able to drive to college.    Baseline Patient has not been driving, her family takes her to school and drops her off.    Time 6   Period Months   Status On-going     OT LONG TERM GOAL  #15   TITLE Patient will be able to obtain food tray in the cafeteria from a wheelchair level and take to the table without spilling with modified independence.    Baseline max assist   Time 6   Period Months   Status Achieved               Plan - 01/21/17 0952    Clinical Impression Statement Patient continues to work towards speed and dexterity of bilateral UE hand movements.  Left hand with scraped knuckles on ring and small finger today, therapist covered  with bandaids during session.  Patient  scraped them at school on concrete while doing some project.  Continue to work towards goals in POC to increase indepdendence in daily tasks at home and school.     Rehab Potential Good   OT Frequency 1x / week   OT Duration 12 weeks   OT Treatment/Interventions Self-care/ADL training;Therapeutic exercise;Therapeutic exercises;Therapeutic activities;DME and/or AE instruction;Patient/family education   Consulted and Agree with Plan of Care Patient      Patient will benefit from skilled therapeutic intervention in order to improve the following deficits and impairments:  Impaired UE functional use, Difficulty walking, Decreased strength, Decreased coordination, Decreased endurance, Decreased balance  Visit Diagnosis: Muscle weakness (generalized)  Other lack of coordination    Problem List There are no active problems to display for this patient.  Kerrie Buffalomy T Aiva Miskell, OTR/L, CLT  Felicia Bloomquist 01/22/2017, 2:21 PM  [facility] MAIN [facility] [address] , [place], [address] Phone: [phone]   Fax:  [phone]  Name: Michelle Randall MRN: [id number] Date of Birth: 06/14/1996

## 2017-01-25 ENCOUNTER — Encounter: Payer: Medicaid Other | Admitting: Occupational Therapy

## 2017-01-25 ENCOUNTER — Ambulatory Visit: Payer: Medicaid Other | Admitting: Physical Therapy

## 2017-01-26 ENCOUNTER — Ambulatory Visit: Payer: Medicaid Other | Admitting: Physical Therapy

## 2017-01-26 ENCOUNTER — Encounter: Payer: Medicaid Other | Admitting: Occupational Therapy

## 2017-01-28 ENCOUNTER — Ambulatory Visit: Payer: Medicaid Other

## 2017-01-28 ENCOUNTER — Ambulatory Visit: Payer: Medicaid Other | Admitting: Occupational Therapy

## 2017-01-28 DIAGNOSIS — R262 Difficulty in walking, not elsewhere classified: Secondary | ICD-10-CM

## 2017-01-28 DIAGNOSIS — M6281 Muscle weakness (generalized): Secondary | ICD-10-CM

## 2017-01-28 DIAGNOSIS — R278 Other lack of coordination: Secondary | ICD-10-CM

## 2017-01-28 NOTE — Therapy (Signed)
[facility] MA[facility] SERVICES [address] [place], [place], [address] Phone: [phone]   Fax:  [phone]  Physical Therapy Treatment  Patient Details  Name: CHANDEL ZAUN MRN: [id number] Date of Birth: 1996/05/08 No Data Recorded  Encounter Date: 01/28/2017      PT End of Session - 01/28/17 1109    Visit Number 75   Number of Visits 45   Date for PT Re-Evaluation 04/22/17   Authorization Type medicaid   PT Start Time 1015   PT Stop Time 1100   PT Time Calculation (min) 45 min   Equipment Utilized During Treatment Gait belt   Activity Tolerance Patient tolerated treatment well;Patient limited by fatigue   Behavior During Therapy [facility] for tasks assessed/performed      History reviewed. No pertinent past medical history.  History reviewed. No pertinent surgical history.  There were no vitals filed for this visit.      Subjective Assessment - 01/28/17 1026    Subjective Patient reports she felt tired after the previous session. Patient reports she ocntinues to demosntrate difficulty with transfering into a large car, and difficulty with transferring from lying to standing.    Patient is accompained by: Family member   Limitations Walking   Patient Stated Goals Patient wants to improve her core strength.    Currently in Pain? No/denies            [facility] PT Assessment - 01/28/17 1029      Standardized Balance Assessment   Standardized Balance Assessment Berg Balance Test;Dynamic Gait Index;Timed Up and Go Test     Berg Balance Test   Sit to Stand Able to stand without using hands and stabilize independently   Standing Unsupported Able to stand safely 2 minutes   Sitting with Back Unsupported but Feet Supported on Floor or Stool Able to sit safely and securely 2 minutes   Stand to Sit Sits safely with minimal use of hands   Transfers Able to transfer safely, minor use of hands   Standing Unsupported with Eyes Closed  Able to stand 10 seconds safely   Standing Ubsupported with Feet Together Able to place feet together independently and stand 1 minute safely   From Standing, Reach Forward with Outstretched Arm Can reach forward >12 cm safely (5")   From Standing Position, Pick up Object from Floor Able to pick up shoe safely and easily   From Standing Position, Turn to Look Behind Over each Shoulder Looks behind one side only/other side shows less weight shift   Turn 360 Degrees Needs close supervision or verbal cueing   Standing Unsupported, Alternately Place Feet on Step/Stool Needs assistance to keep from falling or unable to try   Standing Unsupported, One Foot in [employer] balance while stepping or standing   Standing on One Leg Unable to try or needs assist to prevent fall   Total Score 39     Dynamic Gait Index   Level Surface Mild Impairment   Change in Gait Speed Normal   Gait with Horizontal Head Turns Mild Impairment   Gait with Vertical Head Turns Normal   Gait and Pivot Turn Normal   Step Over Obstacle Moderate Impairment   Step Around Obstacles Normal   Steps Moderate Impairment   Total Score 18       TREATMENT: Step taps from 8" - x 10B Step ups onto 8" - x15 Side Stepping onto 8" - x 15 Feet together balancing without  UE support - x30sec B Shortened and widened tandem stance - x30sec B  Ambulation 700 ft with cueing on speed and step length.  Ambulation with head turns up/down; left/right - x7ft each direction Observation: : 761ft : .3m/s       PT Education - 01/28/17 1110    Education provided Yes   Education Details Education with ambulating cues on gait speed and step length   Person(s) Educated Patient   Methods Explanation;Demonstration   Comprehension Verbalized understanding;Returned demonstration             PT Long Term Goals - 01/28/17 1104      PT LONG TERM GOAL #1   Title Patient will improve Dynamic Gait Index (DGI) score to > 19/24  for low falls risk regarding dynamic walking tasks    Baseline 14/24; 11/02/16: 15/24 01/28/17: 18/24   Time 12   Period Weeks   Status On-going     PT LONG TERM GOAL #2   Title Patient will increase Berg Balance score by > 6 points to demonstrate decreased fall risk during functional activities    Baseline 11/02/16: 34/56 01/28/17: 39/56   Time 12   Period Weeks   Status On-going     PT LONG TERM GOAL #3   Title Patient will be able to transfer in and out of a large car with Patient will be able to transfer in and out of a high seated car with CGA    Baseline Patient requires min for transfer into large car   Time 12   Period Weeks   Status On-going     PT LONG TERM GOAL #4   Title Patient will complete a TUG test in < 12 seconds for independent mobility and decreased fall risk    Baseline 11.45   Time 12   Period Weeks   Status Achieved     PT LONG TERM GOAL #5   Title Patient will improve 6 minute walk distance by > 150 ft for improved return to functional community activities    Baseline 740 01/28/2017: 751ft   Time 12   Status On-going     PT LONG TERM GOAL #6   Title Patient will improve gait speed to > 1.2 m/s with least restrictive assistive device to return to normal walking speed    Baseline .76 m/h 01/28/2017: .87   Time 12   Period Weeks   Status On-going     PT LONG TERM GOAL #7   Title Patient (< 31 years old) will complete five times sit to stand test in < 10 seconds indicating an increased LE strength and improved balance    Baseline 9.45 sec   Time 12   Period Weeks   Status Achieved     PT LONG TERM GOAL #8   Title Patient will be able to ambulate on inclines and grass independenlty with LRAD   Baseline Can ambulate over smooth grass but unable to safely amb over heavy uneven surfaces   Time 12   Period Weeks   Status On-going     PT LONG TERM GOAL  #9   TITLE Patient will be abe to transfer from low chair or stool wihtout UE support independently    Baseline Patient needs min asssit to transfer from a low stool   Period Weeks   Status Achieved     PT LONG TERM GOAL  #10   TITLE Patient will be able to transfer from the floor to  standing independently with use of LRAD   Baseline Patient needs min assist to transfer from the floor to standing   Time 12   Period Weeks   Status On-going               Plan - 01/28/17 1113    Clinical Impression Statement Patient demonstrates improvement in 6minwt, 10mwt, BERG, and DGI scores indicating improvement in muscular endurance, dynamic/static balance, and fall risk. Although patient is improving, she continues to demosntrate decreased balance with narrow BOS, when turning in standing, and decreased muscular strength. Patient will benefit from further skilled therapy focused on improving limitations to return to prior level of function.    Rehab Potential Good   Clinical Impairments Affecting Rehab Potential weakness and decreased standing balance   PT Frequency 1x / week   PT Duration 12 weeks   PT Treatment/Interventions Therapeutic exercise;Therapeutic activities;Gait training;Balance training;Stair training;DME Instruction;Neuromuscular re-education;Patient/family education   PT Next Visit Plan Bil knee flexion/extension strengthening; Progress LE, core strengthening and balance   PT Home Exercise Plan Continued from previous sessions.    Consulted and Agree with Plan of Care Patient      Patient will benefit from skilled therapeutic intervention in order to improve the following deficits and impairments:  Abnormal gait, Decreased balance, Decreased endurance, Difficulty walking, Decreased strength  Visit Diagnosis: Muscle weakness (generalized)  Other lack of coordination  Difficulty in walking, not elsewhere classified     Problem List There are no active problems to display for this patient.   Myrene GalasWesley Alexzia Kasler, PT DPT 01/28/2017, 11:16 AM  [facility] [address] [place], [place], [address] Phone: [phone]   Fax:  [phone]  Name: Julaine HuaMisbah N Courtwright MRN: [id number] Date of Birth: 1995-11-23

## 2017-01-28 NOTE — Therapy (Signed)
[facility] MAIN [facility] SERVICES [address] [place], [place], [address] Phone: [phone]   Fax:  [phone]  Occupational Therapy Treatment  Patient Details  Name: Michelle Randall MRN: [id number] Date of Birth: 08/25/1996 No Data Recorded  Encounter Date: 01/28/2017      OT End of Session - 01/28/17 1007    Visit Number 66   Number of Visits 82   Date for OT Re-Evaluation 03/18/17   Authorization Type medicaid visit 45 of 60   OT Start Time [phone]   OT Stop Time 1015   OT Time Calculation (min) 38 min   Activity Tolerance Patient tolerated treatment well   Behavior During Therapy [facility] for tasks assessed/performed      No past medical history on file.  No past surgical history on file.  There were no vitals filed for this visit.      Subjective Assessment - 01/28/17 1003    Subjective  Pt. reports she is starting spring break, and plans to relax for the week.   Patient is accompained by: Family member   Patient Stated Goals To be as independent as possible at home and at school.    Currently in Pain? No/denies                      OT Treatments/Exercises (OP) - 01/28/17 1043      Fine Motor Coordination   Other Fine Motor Exercises Pt. Worked on grasping, flipping, turning, and stacking pegs on the [employer]. Pt. performed [facility] tasks using the Grooved pegboard. Pt. worked on grasping the grooved pegs from a horizontal position, and moving the pegs to a vertical position in the hand to prepare for placing them in the grooved slot. Pt. Worked on grasping 1" resistive cubes placed at a vertical angle. Pt. Worked on removing them while alternating thumb opposition to the tip of her 2nd through 5th digits.      Neurological Re-education Exercises   Other Exercises 1 Pt. performed gross gripping with grip strengthener. Pt. worked on sustaining grip while grasping pegs and reaching at various heights. Gripper was  placed in the 3rd resistive slot with the white resistive spring. Pt. Worked on pinch strengthening in the left hand for lateral, and 3pt. pinch using yellow, red, green, and blue resistive clips. Pt. worked on placing the clips at various vertical and horizontal angles. Tactile and verbal cues were required for eliciting the desired movement.                OT Education - 01/28/17 1004    Education provided Yes   Education Details FMC, grip, and pinch strength.   Person(s) Educated Patient   Methods Explanation;Demonstration   Comprehension Verbalized understanding;Returned demonstration             OT Long Term Goals - 12/24/16 1110      OT LONG TERM GOAL #1   Title Patient will demonstrate methods of picking up her bookbag and placing it onto the back of her chair with modified independence to transport items to class.   Baseline requires assistance from others to complete now but will need to be independent when at college in the fall .   Time 6   Period Months   Status Achieved     OT LONG TERM GOAL #2   Title Patient will be able to make her bed with modified independence.   Baseline she continues to have  difficulty with balance and completing this task, increased time.   Time 6   Period Months   Status On-going     OT LONG TERM GOAL #3   Title Patient will demonstrate the ability to perform vacuuming of one room with modified independence using light weight vacuum.   Baseline still requires max assist   Time 6   Period Months   Status On-going     OT LONG TERM GOAL #4   Title Patient will demonstrate the ability to open containers with sealed tops such as on yogurt containers and individual ice cream containers.    Baseline increased difficulty with sealed, peel away container tops and requires assistance or modifications to open at times   Time 12   Period Weeks   Status On-going     OT LONG TERM GOAL #5   Title Patient will demonstrate the ability to  perform small buttons on shirts independently and with good speed in order to dress for school.   Baseline getting easier but if rushed she often does the wrong button holes and will have to have mom to redo.    Time 12   Period Weeks   Status On-going     OT LONG TERM GOAL #6   Title Patient will donn and doff small earrings with modified independence.   Baseline mod assist from mom, patient has difficulty holding and dropping with attempts, longer earring are easier.   Time 12   Period Weeks   Status On-going     OT LONG TERM GOAL #7   Title Patient will be able to zip her rainboots while they are on her feet with modified independence.     Baseline can operate zipper when not on feet but has difficulty when putting boots on to zip them.    Time 12   Period Weeks   Status New     OT LONG TERM GOAL #8   Title Patient to complete am self care demonstrating efficient methods of dressing skills to get to school on time.    Baseline often has to have her mom help her to get dressed to get to school on time.   Time 12   Period Weeks   Status New     OT LONG TERM GOAL  #9   Baseline Patient will be able to braid her own hair before college   Time 6   Period Months   Status Achieved     OT LONG TERM GOAL  #10   Time 6     OT LONG TERM GOAL  #11   TITLE Will be able to squeeze tooth paste out of the tube when it is less than half full.   Baseline difficulty with small travel sized.   Time 6   Period Months   Status On-going     OT LONG TERM GOAL  #12   TITLE Patient will demonstrate managing wheelchair rain poncho in inclement weather with modified independence.   Baseline crutch tips are difficult for anyone to take them off, goal deferred.   Time 6   Period Months   Status Achieved     OT LONG TERM GOAL  #13   TITLE Patient will be able to complete 2 1/2 minutes straight with left shoulder flexion simulating driving with a stearing wheel ball to be able to drive to  college.    Baseline Patient has not been driving, her family takes her to school and drops  her off.    Time 6   Period Months   Status On-going     OT LONG TERM GOAL  #15   TITLE Patient will be able to obtain food tray in the cafeteria from a wheelchair level and take to the table without spilling with modified independence.    Baseline max assist   Time 6   Period Months   Status Achieved             Patient will benefit from skilled therapeutic intervention in order to improve the following deficits and impairments:     Visit Diagnosis: Muscle weakness (generalized)    Problem List There are no active problems to display for this patient.   Olegario Messier, MS, OTR/L 01/28/2017, 10:44 AM  [facility] MAIN [facility] SERVICES [address] [place], [place], [address] Phone: [phone]   Fax:  [phone]  Name: Michelle Randall MRN: [id number] Date of Birth: 02/19/1996

## 2017-01-28 NOTE — Patient Instructions (Signed)
OT TREATMENT     Neuro muscular re-education:  Pt. Worked on grasping, flipping, turning, and stacking pegs on the [employer] board. Pt. performed [facility] tasks using the Grooved pegboard. Pt. worked on grasping the grooved pegs from a horizontal position, and moving the pegs to a vertical position in the hand to prepare for placing them in the grooved slot. Pt. Worked on grasping 1" resistive cubes placed at a vertical angle. Pt. Worked on removing them while alternating thumb opposition to the tip of her 2nd through 5th digits.   Therapeutic Exercise:  Pt. performed gross gripping with grip strengthener. Pt. worked on sustaining grip while grasping pegs and reaching at various heights. Gripper was placed in the 3rd resistive slot with the white resistive spring. Pt. Worked on pinch strengthening in the left hand for lateral, and 3pt. pinch using yellow, red, green, and blue resistive clips. Pt. worked on placing the clips at various vertical and horizontal angles. Tactile and verbal cues were required for eliciting the desired movement.  Selfcare:  Manual Therapy:

## 2017-02-01 ENCOUNTER — Encounter: Payer: Medicaid Other | Admitting: Occupational Therapy

## 2017-02-01 ENCOUNTER — Encounter: Payer: Self-pay | Admitting: Occupational Therapy

## 2017-02-01 ENCOUNTER — Ambulatory Visit: Payer: Medicaid Other | Admitting: Physical Therapy

## 2017-02-01 ENCOUNTER — Ambulatory Visit: Payer: Medicaid Other | Admitting: Occupational Therapy

## 2017-02-01 DIAGNOSIS — R278 Other lack of coordination: Secondary | ICD-10-CM | POA: Diagnosis not present

## 2017-02-01 DIAGNOSIS — M6281 Muscle weakness (generalized): Secondary | ICD-10-CM

## 2017-02-02 ENCOUNTER — Encounter: Payer: Medicaid Other | Admitting: Occupational Therapy

## 2017-02-02 ENCOUNTER — Ambulatory Visit: Payer: Medicaid Other | Admitting: Physical Therapy

## 2017-02-02 NOTE — Therapy (Signed)
[facility] MAIN [facility] SERVICES [address] [place], [place], [address] Phone: [phone]   Fax:  [phone]  Occupational Therapy Treatment  Patient Details  Name: Michelle Randall MRN: [id number] Date of Birth: [birth year] No Data Recorded  Encounter Date: 02/01/2017      OT End of Session - 02/01/17 2017    Visit Number 67   Number of Visits 82   Date for OT Re-Evaluation 03/18/17   Authorization Type medicaid visit 46 of 60   OT Start Time 1305   OT Stop Time 1354   OT Time Calculation (min) 49 min   Activity Tolerance Patient tolerated treatment well   Behavior During Therapy [facility] for tasks assessed/performed      History reviewed. No pertinent past medical history.  History reviewed. No pertinent surgical history.  There were no vitals filed for this visit.      Subjective Assessment - 02/01/17 2017    Patient is accompained by: Family member   Patient Stated Goals To be as independent as possible at home and at school.    Currently in Pain? No/denies   Pain Score 0-No pain                      OT Treatments/Exercises (OP) - 02/02/17 1953      Fine Motor Coordination   Other Fine Motor Exercises Patient seen for braiding exercise with arms in shoulder flexion with 2 reps in front of her and then overhead behind her for 1 set. Cues for technique and difficulty noted with performing behind the head.      Neurological Re-education Exercises   Other Exercises 1 Patient seen UB strengthening tasks with red theraband for shoulder flexion, ABD, ADD, diagonal patterns, elbow flexion/ext for 15 reps for 2 sets each with cues for form and technique.  Patient performing resistive push pins into bulletin board for 20 reps each hand.                 OT Education - 02/01/17 2017    Education provided Yes   Education Details strength and coordination skills for home exercises   Person(s) Educated Patient   Methods Explanation;Demonstration;Verbal cues   Comprehension Verbal cues required;Returned demonstration;Verbalized understanding             OT Long Term Goals - 12/24/16 1110      OT LONG TERM GOAL #1   Title Patient will demonstrate methods of picking up her bookbag and placing it onto the back of her chair with modified independence to transport items to class.   Baseline requires assistance from others to complete now but will need to be independent when at college in the fall .   Time 6   Period Months   Status Achieved     OT LONG TERM GOAL #2   Title Patient will be able to make her bed with modified independence.   Baseline she continues to have difficulty with balance and completing this task, increased time.   Time 6   Period Months   Status On-going     OT LONG TERM GOAL #3   Title Patient will demonstrate the ability to perform vacuuming of one room with modified independence using light weight vacuum.   Baseline still requires max assist   Time 6   Period Months   Status On-going     OT LONG TERM GOAL #4   Title Patient will demonstrate the  ability to open containers with sealed tops such as on yogurt containers and individual ice cream containers.    Baseline increased difficulty with sealed, peel away container tops and requires assistance or modifications to open at times   Time 12   Period Weeks   Status On-going     OT LONG TERM GOAL #5   Title Patient will demonstrate the ability to perform small buttons on shirts independently and with good speed in order to dress for school.   Baseline getting easier but if rushed she often does the wrong button holes and will have to have mom to redo.    Time 12   Period Weeks   Status On-going     OT LONG TERM GOAL #6   Title Patient will donn and doff small earrings with modified independence.   Baseline mod assist from mom, patient has difficulty holding and dropping with attempts, longer earring are easier.    Time 12   Period Weeks   Status On-going     OT LONG TERM GOAL #7   Title Patient will be able to zip her rainboots while they are on her feet with modified independence.     Baseline can operate zipper when not on feet but has difficulty when putting boots on to zip them.    Time 12   Period Weeks   Status New     OT LONG TERM GOAL #8   Title Patient to complete am self care demonstrating efficient methods of dressing skills to get to school on time.    Baseline often has to have her mom help her to get dressed to get to school on time.   Time 12   Period Weeks   Status New     OT LONG TERM GOAL  #9   Baseline Patient will be able to braid her own hair before college   Time 6   Period Months   Status Achieved     OT LONG TERM GOAL  #10   Time 6     OT LONG TERM GOAL  #11   TITLE Will be able to squeeze tooth paste out of the tube when it is less than half full.   Baseline difficulty with small travel sized.   Time 6   Period Months   Status On-going     OT LONG TERM GOAL  #12   TITLE Patient will demonstrate managing wheelchair rain poncho in inclement weather with modified independence.   Baseline crutch tips are difficult for anyone to take them off, goal deferred.   Time 6   Period Months   Status Achieved     OT LONG TERM GOAL  #13   TITLE Patient will be able to complete 2 1/2 minutes straight with left shoulder flexion simulating driving with a stearing wheel ball to be able to drive to college.    Baseline Patient has not been driving, her family takes her to school and drops her off.    Time 6   Period Months   Status On-going     OT LONG TERM GOAL  #15   TITLE Patient will be able to obtain food tray in the cafeteria from a wheelchair level and take to the table without spilling with modified independence.    Baseline max assist   Time 6   Period Months   Status Achieved               Plan -  02/01/17 2018    Clinical Impression Statement  Patient continues to progress in all areas, has some difficulty with resistive push pins in bulletin board to hang school work demonstrating decreased finger strength.  Patient does well with braiding if able to use vision to compensate but has difficulty if she is not able to see the braid behind the head.  Continue to work towards goals to increase independence in daily tasks.    Rehab Potential Good   OT Frequency 1x / week   OT Duration 12 weeks   OT Treatment/Interventions Self-care/ADL training;Therapeutic exercise;Therapeutic exercises;Therapeutic activities;DME and/or AE instruction;Patient/family education   Consulted and Agree with Plan of Care Patient      Patient will benefit from skilled therapeutic intervention in order to improve the following deficits and impairments:  Impaired UE functional use, Difficulty walking, Decreased strength, Decreased coordination, Decreased endurance, Decreased balance  Visit Diagnosis: Muscle weakness (generalized)  Other lack of coordination    Problem List There are no active problems to display for this patient.  Kerrie Buffalo, OTR/L, CLT  Ismael Karge 02/02/2017, 8:51 PM  [facility] MAIN [facility] SERVICES [address] [place], [place], [address] Phone: [phone]   Fax:  [phone]  Name: Michelle Randall MRN: [id number] Date of Birth: 09-14-1996

## 2017-02-03 ENCOUNTER — Encounter: Payer: Medicaid Other | Admitting: Occupational Therapy

## 2017-02-03 ENCOUNTER — Ambulatory Visit: Payer: Medicaid Other

## 2017-02-03 DIAGNOSIS — R278 Other lack of coordination: Secondary | ICD-10-CM | POA: Diagnosis not present

## 2017-02-03 DIAGNOSIS — M6281 Muscle weakness (generalized): Secondary | ICD-10-CM

## 2017-02-03 DIAGNOSIS — R262 Difficulty in walking, not elsewhere classified: Secondary | ICD-10-CM

## 2017-02-03 NOTE — Therapy (Signed)
[facility] MAIN [facility] SERVICES [address] [place], [place], [address] Phone: [phone]   Fax:  [phone]  Physical Therapy Treatment  Patient Details  Name: Michelle Randall MRN: [id number] Date of Birth: 1996-04-23 No Data Recorded  Encounter Date: 02/03/2017      PT End of Session - 02/03/17 1141    Visit Number 76   Number of Visits 45   Date for PT Re-Evaluation 04/27/17   Authorization Type medicaid   PT Start Time 1120   PT Stop Time 1200   PT Time Calculation (min) 40 min   Equipment Utilized During Treatment Gait belt   Activity Tolerance Patient tolerated treatment well;Patient limited by fatigue   Behavior During Therapy [facility] for tasks assessed/performed      History reviewed. No pertinent past medical history.  History reviewed. No pertinent surgical history.  There were no vitals filed for this visit.      Subjective Assessment - 02/03/17 1138    Subjective Patient reports she is doing well and has had many appointments this morning. Patient states no major changes otherwise.   Patient is accompained by: Family member   Limitations Walking   Patient Stated Goals Patient wants to improve her core strength.    Currently in Pain? No/denies        TREATMENT  Therapeutic Exercise Nustep level 5 - 6 min with cueing on speed and performance Penguin crunches in hooklying - 2 x 20 Arms overhead with 4# ball with feet elevated - 2 x15  Planks in prone on elbows - 3 x 30sec Step ups to 8" step with airex pad on top - x10 B LE Toe taps from 6" steps with UE support - x 15 B Feet apart balancing on airex without UE support - 2 x 30sec  Sideways walking on airex beam x2 lengths in // bars with airex pad underneath beam with BUE support. Cues for upright posture and forward gaze.  Tandem walking forward/backwards on airex beam underneath airex pad x2 lengths in // bars with 1 UE support        PT Education -  02/03/17 1139    Education provided Yes   Education Details Form/technique during session   Person(s) Educated Patient   Methods Explanation;Demonstration   Comprehension Verbalized understanding;Returned demonstration             PT Long Term Goals - 01/28/17 1104      PT LONG TERM GOAL #1   Title Patient will improve Dynamic Gait Index (DGI) score to > 19/24 for low falls risk regarding dynamic walking tasks    Baseline 14/24; 11/02/16: 15/24 01/28/17: 18/24   Time 12   Period Weeks   Status On-going     PT LONG TERM GOAL #2   Title Patient will increase Berg Balance score by > 6 points to demonstrate decreased fall risk during functional activities    Baseline 11/02/16: 34/56 01/28/17: 39/56   Time 12   Period Weeks   Status On-going     PT LONG TERM GOAL #3   Title Patient will be able to transfer in and out of a large car with Patient will be able to transfer in and out of a high seated car with CGA    Baseline Patient requires min for transfer into large car   Time 12   Period Weeks   Status On-going     PT LONG TERM GOAL #4   Title  Patient will complete a TUG test in < 12 seconds for independent mobility and decreased fall risk    Baseline 11.45   Time 12   Period Weeks   Status Achieved     PT LONG TERM GOAL #5   Title Patient will improve 6 minute walk distance by > 150 ft for improved return to functional community activities    Baseline 740 01/28/2017: [id number]   Time 12   Status On-going     PT LONG TERM GOAL #6   Title Patient will improve gait speed to > 1.2 m/s with least restrictive assistive device to return to normal walking speed    Baseline .76 m/h 01/28/2017: .87   Time 12   Period Weeks   Status On-going     PT LONG TERM GOAL #7   Title Patient (21 years old) will complete five times sit to stand test in < 10 seconds indicating an increased LE strength and improved balance    Baseline 9.45 sec   Time 12   Period Weeks   Status Achieved      PT LONG TERM GOAL #8   Title Patient will be able to ambulate on inclines and grass independenlty with LRAD   Baseline Can ambulate over smooth grass but unable to safely amb over heavy uneven surfaces   Time 12   Period Weeks   Status On-going     PT LONG TERM GOAL  #9   TITLE Patient will be abe to transfer from low chair or stool wihtout UE support independently   Baseline Patient needs min asssit to transfer from a low stool   Period Weeks   Status Achieved     PT LONG TERM GOAL  #10   TITLE Patient will be able to transfer from the floor to standing independently with use of LRAD   Baseline Patient needs min assist to transfer from the floor to standing   Time 12   Period Weeks   Status On-going               Plan - 02/03/17 1142    Clinical Impression Statement Patient demonstrates increased postural sway with standing balance exercises indicating decreased static and dynamic balance. Focused on performing more standing exercises today as patient demonstrates difficulty with trasnfering into large vechiles; patient will benefit from further skilled therapy to return to prior level of function.    Rehab Potential Good   Clinical Impairments Affecting Rehab Potential weakness and decreased standing balance   PT Frequency 1x / week   PT Duration 12 weeks   PT Treatment/Interventions Therapeutic exercise;Therapeutic activities;Gait training;Balance training;Stair training;DME Instruction;Neuromuscular re-education;Patient/family education   PT Next Visit Plan Bil knee flexion/extension strengthening; Progress LE, core strengthening and balance   PT Home Exercise Plan Continued from previous sessions.    Consulted and Agree with Plan of Care Patient      Patient will benefit from skilled therapeutic intervention in order to improve the following deficits and impairments:  Abnormal gait, Decreased balance, Decreased endurance, Difficulty walking, Decreased  strength  Visit Diagnosis: Muscle weakness (generalized)  Other lack of coordination  Difficulty in walking, not elsewhere classified     Problem List There are no active problems to display for this patient.   Myrene GalasWesley Sabri Teal, PT DPT 02/03/2017, 12:02 PM  [facility]IN [facility] [address] [place], [place], [address] Phone: [phone]   Fax:  [phone]  Name: Julaine HuaMisbah N Boldman  MRN: [id number] Date of Birth: Jan 07, 1996

## 2017-02-09 ENCOUNTER — Encounter: Payer: Medicaid Other | Admitting: Occupational Therapy

## 2017-02-09 ENCOUNTER — Ambulatory Visit: Payer: Medicaid Other | Admitting: Physical Therapy

## 2017-02-11 ENCOUNTER — Encounter: Payer: Self-pay | Admitting: Occupational Therapy

## 2017-02-11 ENCOUNTER — Ambulatory Visit: Payer: Medicaid Other | Admitting: Occupational Therapy

## 2017-02-11 DIAGNOSIS — R278 Other lack of coordination: Secondary | ICD-10-CM

## 2017-02-11 DIAGNOSIS — M6281 Muscle weakness (generalized): Secondary | ICD-10-CM

## 2017-02-11 NOTE — Therapy (Signed)
[facility] MAIN [facility] SERVICES [address] [place], [place], [address] Phone: [phone]   Fax:  [phone]  Occupational Therapy Treatment  Patient Details  Name: Michelle Randall MRN: [id number] Date of Birth: 01-Jun-1996 No Data Recorded  Encounter Date: 02/11/2017      OT End of Session - 02/11/17 1515    Visit Number 68   Number of Visits 82   Date for OT Re-Evaluation 03/18/17   Authorization Type medicaid visit 47 of 60   OT Start Time 0931   OT Stop Time 1020   OT Time Calculation (min) 49 min   Activity Tolerance Patient tolerated treatment well   Behavior During Therapy [facility] for tasks assessed/performed      History reviewed. No pertinent past medical history.  History reviewed. No pertinent surgical history.  There were no vitals filed for this visit.      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  "I would like to be able to roll the coins better"   Patient Stated Goals To be as independent as possible at home and at school.    Currently in Pain? No/denies   Pain Score 0-No pain                      OT Treatments/Exercises (OP) - 02/11/17 1550      Fine Motor Coordination   Other Fine Motor Exercises Patient seen for fine motor coordination tasks with manipulation of smaller pieces of [employer] with dowels, washers and collars placed into magnetic bowl to increase difficulty of picking up pieces and manipulation with right hand. Cues for prehension patterns, dropped 3 items this date.       Neurological Re-education Exercises   Other Exercises 1 Right and left hand grip strengthening with 17# for  25 reps each hand.  3# dowel exercises for shoulder flexion, ABD/ADD, chest press, forwards backwards circles for 12 reps for 2 sets, cues for form and technique.                   OT Education - 02/11/17 1515    Education provided Yes   Education Details fine motor coordination   Person(s) Educated Patient   Methods Explanation;Demonstration;Verbal cues   Comprehension Verbal cues required;Returned demonstration;Verbalized understanding             OT Long Term Goals - 12/24/16 1110      OT LONG TERM GOAL #1   Title Patient will demonstrate methods of picking up her bookbag and placing it onto the back of her chair with modified independence to transport items to class.   Baseline requires assistance from others to complete now but will need to be independent when at college in the fall .   Time 6   Period Months   Status Achieved     OT LONG TERM GOAL #2   Title Patient will be able to make her bed with modified independence.   Baseline she continues to have difficulty with balance and completing this task, increased time.   Time 6   Period Months   Status On-going     OT LONG TERM GOAL #3   Title Patient will demonstrate the ability to perform vacuuming of one room with modified independence using light weight vacuum.  Baseline still requires max assist   Time 6   Period Months   Status On-going     OT LONG TERM GOAL #4   Title Patient will demonstrate the ability to open containers with sealed tops such as on yogurt containers and individual ice cream containers.    Baseline increased difficulty with sealed, peel away container tops and requires assistance or modifications to open at times   Time 12   Period Weeks   Status On-going     OT LONG TERM GOAL #5   Title Patient will demonstrate the ability to perform small buttons on shirts independently and with good speed in order to dress for school.   Baseline getting easier but if rushed she often does the wrong button holes and will have to have mom to redo.    Time 12   Period Weeks   Status On-going     OT LONG TERM GOAL #6   Title Patient will donn and doff  small earrings with modified independence.   Baseline mod assist from mom, patient has difficulty holding and dropping with attempts, longer earring are easier.   Time 12   Period Weeks   Status On-going     OT LONG TERM GOAL #7   Title Patient will be able to zip her rainboots while they are on her feet with modified independence.     Baseline can operate zipper when not on feet but has difficulty when putting boots on to zip them.    Time 12   Period Weeks   Status New     OT LONG TERM GOAL #8   Title Patient to complete am self care demonstrating efficient methods of dressing skills to get to school on time.    Baseline often has to have her mom help her to get dressed to get to school on time.   Time 12   Period Weeks   Status New     OT LONG TERM GOAL  #9   Baseline Patient will be able to braid her own hair before college   Time 6   Period Months   Status Achieved     OT LONG TERM GOAL  #10   Time 6     OT LONG TERM GOAL  #11   TITLE Will be able to squeeze tooth paste out of the tube when it is less than half full.   Baseline difficulty with small travel sized.   Time 6   Period Months   Status On-going     OT LONG TERM GOAL  #12   TITLE Patient will demonstrate managing wheelchair rain poncho in inclement weather with modified independence.   Baseline crutch tips are difficult for anyone to take them off, goal deferred.   Time 6   Period Months   Status Achieved     OT LONG TERM GOAL  #13   TITLE Patient will be able to complete 2 1/2 minutes straight with left shoulder flexion simulating driving with a stearing wheel ball to be able to drive to college.    Baseline Patient has not been driving, her family takes her to school and drops her off.    Time 6   Period Months   Status On-going     OT LONG TERM GOAL  #15   TITLE Patient will be able to obtain food tray in the cafeteria from a wheelchair level and take to the table without spilling with modified  independence.  Baseline max assist   Time 6   Period Months   Status Achieved               Plan - 02/11/17 1516    Clinical Impression Statement Patient continues to work towards improving strength, coordination, speed and dexterity.  She had difficulty recently with counting and rolling coins for banking as a part of a school project, will plan to work more with coin manipulation and the skill of rolling coins in next sessions.  Mild difficulty with fine motor tasks with increased resistance today.     Rehab Potential Good   OT Frequency 1x / week   OT Duration 12 weeks   OT Treatment/Interventions Self-care/ADL training;Therapeutic exercise;Therapeutic exercises;Therapeutic activities;DME and/or AE instruction;Patient/family education   Consulted and Agree with Plan of Care Patient      Patient will benefit from skilled therapeutic intervention in order to improve the following deficits and impairments:  Impaired UE functional use, Difficulty walking, Decreased strength, Decreased coordination, Decreased endurance, Decreased balance  Visit Diagnosis: Muscle weakness (generalized)  Other lack of coordination    Problem List There are no active problems to display for this patient.  Kerrie Buffalo, OTR/L, CLT  Lovett,Amy 02/11/2017, 3:54 PM  [facility] MAIN [facility] SERVICES [address] [place], [place], [address] Phone: [phone]   Fax:  [phone]  Name: AAVYA SHAFER MRN: [id number] Date of Birth: [birth year]

## 2017-02-14 ENCOUNTER — Ambulatory Visit: Payer: Medicaid Other | Admitting: Occupational Therapy

## 2017-02-14 ENCOUNTER — Ambulatory Visit: Payer: Medicaid Other

## 2017-02-15 ENCOUNTER — Ambulatory Visit: Payer: Medicaid Other | Attending: Pediatrics | Admitting: Physical Therapy

## 2017-02-15 ENCOUNTER — Encounter: Payer: Self-pay | Admitting: Physical Therapy

## 2017-02-15 ENCOUNTER — Ambulatory Visit: Payer: Medicaid Other

## 2017-02-15 DIAGNOSIS — M6281 Muscle weakness (generalized): Secondary | ICD-10-CM | POA: Insufficient documentation

## 2017-02-15 DIAGNOSIS — R278 Other lack of coordination: Secondary | ICD-10-CM | POA: Insufficient documentation

## 2017-02-15 DIAGNOSIS — R262 Difficulty in walking, not elsewhere classified: Secondary | ICD-10-CM

## 2017-02-15 NOTE — Therapy (Signed)
[place] [facility] MAIN [facility] SERVICES [address] [place], [place], [address] Phone: [phone]   Fax:  [phone]  Physical Therapy Treatment  Patient Details  Name: Michelle Randall MRN: [id number] Date of Birth: October 19, 1996 No Data Recorded  Encounter Date: 02/15/2017      PT End of Session - 02/15/17 0848    Visit Number [id number]   Number of Visits 45   Date for PT Re-Evaluation 04/27/17   Authorization Type medicaid   PT Start Time [phone]   PT Stop Time 0900   PT Time Calculation (min) 25 min   Equipment Utilized During Treatment Gait belt   Activity Tolerance Patient tolerated treatment well;Patient limited by fatigue   Behavior During Therapy [facility] for tasks assessed/performed      History reviewed. No pertinent past medical history.  History reviewed. No pertinent surgical history.  There were no vitals filed for this visit.      Subjective Assessment - 02/15/17 0847    Subjective Patient reports she is doing well .Patient states no major changes otherwise.   Patient is accompained by: Family member   Limitations Walking   Patient Stated Goals Patient wants to improve her core strength.    Currently in Pain? No/denies   Pain Score 0-No pain   Pain Onset In the past 7 days   Multiple Pain Sites No      Treatment: Planks prone 1 min x 3 sets Planks sidelying left and right 1 min x 3 sets Patient needs cues for technique and is not able to raise up all the way in sidelying planks.  She reports that she is doing her TM walking at home.                              PT Education - 02/15/17 0848    Education provided Yes   Education Details HEP progression   Person(s) Educated Patient   Methods Explanation   Comprehension Verbalized understanding             PT Long Term Goals - 01/28/17 1104      PT LONG TERM GOAL #1   Title Patient will improve Dynamic Gait Index (DGI) score to > 19/24 for low  falls risk regarding dynamic walking tasks    Baseline 14/24; 11/02/16: 15/24 01/28/17: 18/24   Time 12   Period Weeks   Status On-going     PT LONG TERM GOAL #2   Title Patient will increase Berg Balance score by > 6 points to demonstrate decreased fall risk during functional activities    Baseline 11/02/16: 34/56 01/28/17: 39/56   Time 12   Period Weeks   Status On-going     PT LONG TERM GOAL #3   Title Patient will be able to transfer in and out of a large car with Patient will be able to transfer in and out of a high seated car with CGA    Baseline Patient requires min for transfer into large car   Time 12   Period Weeks   Status On-going     PT LONG TERM GOAL #4   Title Patient will complete a TUG test in < 12 seconds for independent mobility and decreased fall risk    Baseline 11.45   Time 12   Period Weeks   Status Achieved     PT LONG TERM GOAL #5   Title Patient will  improve 6 minute walk distance by > 150 ft for improved return to functional community activities    Baseline 740 01/28/2017: 776ft   Time 12   Status On-going     PT LONG TERM GOAL #6   Title Patient will improve gait speed to > 1.2 m/s with least restrictive assistive device to return to normal walking speed    Baseline .76 m/h 01/28/2017: .87   Time 12   Period Weeks   Status On-going     PT LONG TERM GOAL #7   Title Patient (< 50 years old) will complete five times sit to stand test in < 10 seconds indicating an increased LE strength and improved balance    Baseline 9.45 sec   Time 12   Period Weeks   Status Achieved     PT LONG TERM GOAL #8   Title Patient will be able to ambulate on inclines and grass independenlty with LRAD   Baseline Can ambulate over smooth grass but unable to safely amb over heavy uneven surfaces   Time 12   Period Weeks   Status On-going     PT LONG TERM GOAL  #9   TITLE Patient will be abe to transfer from low chair or stool wihtout UE support independently    Baseline Patient needs min asssit to transfer from a low stool   Period Weeks   Status Achieved     PT LONG TERM GOAL  #10   TITLE Patient will be able to transfer from the floor to standing independently with use of LRAD   Baseline Patient needs min assist to transfer from the floor to standing   Time 12   Period Weeks   Status On-going               Plan - 02/15/17 0848    Clinical Impression Statement Patient was instructed in therapeutic exercises including  core strengthening and LE strengthening. She has LE weakness and has fatigue with closed chain and open chain exercises. She will benefit from skilled PT to improve mobility.    Rehab Potential Good   Clinical Impairments Affecting Rehab Potential weakness and decreased standing balance   PT Frequency 1x / week   PT Duration 12 weeks   PT Treatment/Interventions Therapeutic exercise;Therapeutic activities;Gait training;Balance training;Stair training;DME Instruction;Neuromuscular re-education;Patient/family education   PT Next Visit Plan Bil knee flexion/extension strengthening; Progress LE, core strengthening and balance   PT Home Exercise Plan Continued from previous sessions.    Consulted and Agree with Plan of Care Patient      Patient will benefit from skilled therapeutic intervention in order to improve the following deficits and impairments:  Abnormal gait, Decreased balance, Decreased endurance, Difficulty walking, Decreased strength  Visit Diagnosis: Muscle weakness (generalized)  Other lack of coordination  Difficulty in walking, not elsewhere classified     Problem List There are no active problems to display for this patient.  [name], PT, DPT [place], Barkley Bruns S 02/15/2017, 8:57 AM  [facility] [facility] MAIN [facility] SERVICES [address] [place], [place], [address] Phone: [phone]   Fax:  [phone]  Name: Michelle Randall MRN: [id number] Date  of Birth: 10/04/1996

## 2017-02-18 ENCOUNTER — Ambulatory Visit: Payer: Medicaid Other | Admitting: Occupational Therapy

## 2017-02-18 DIAGNOSIS — R278 Other lack of coordination: Secondary | ICD-10-CM

## 2017-02-18 DIAGNOSIS — M6281 Muscle weakness (generalized): Secondary | ICD-10-CM

## 2017-02-18 NOTE — Therapy (Signed)
[place] [facility] MAIN [facility] SERVICES [address] [place], [place], [address] Phone: [phone]   Fax:  [phone]  Occupational Therapy Treatment  Patient Details  Name: Michelle Randall MRN: [id number] Date of Birth: 25-Nov-1995 No Data Recorded  Encounter Date: 02/18/2017      OT End of Session - 02/18/17 1124    Visit Number 69   Number of Visits 82   Date for OT Re-Evaluation 03/18/17   Authorization Type medicaid visit 48 of 60   OT Start Time 1055   OT Stop Time 1157   OT Time Calculation (min) 62 min   Activity Tolerance Patient tolerated treatment well   Behavior During Therapy WFL for tasks assessed/performed      No past medical history on file.  No past surgical history on file.  There were no vitals filed for this visit.      Subjective Assessment - 02/18/17 1059    Subjective  Has leader orientation tomorrow to be able to help with new student orientation on the [employer].    Patient is accompained by: Family member   Patient Stated Goals To be as independent as possible at home and at school.    Currently in Pain? No/denies   Pain Score 0-No pain   Multiple Pain Sites No                      OT Treatments/Exercises (OP) - 02/18/17 1108      Fine Motor Coordination   Other Fine Motor Exercises Patient seen for coordination exercise with nuts, bolts and washers with emphasis on thumb/finger combinations to manipulate washers on and off, cues for patterns.  15 sets completed with assembly and disasembly.    Other Fine Motor Exercises Patient working towards manipulation of really small objects less than .5 cm in size to pick up and feed onto pin to work towards manipulation of small earring backs.      Neurological Re-education Exercises   Other Exercises 1 Patient seen for bilateral UE strengthening exercises with 4# dowel weight, hand over hand reaching to top with dowel in vertical position on table,  overhead press, chest press, forwards/backwards circles for 12 reps each.  Elbow flexion/extension for 15 reps.  Cues for form and technique with exercises. 3000g weighted ball press up for 10 reps for 2 sets each, chest press 2 X 10.                OT Education - 02/18/17 1124    Education provided Yes   Education Details manipulation of small parts/pieces for thumb finger combination movements    Person(s) Educated Patient   Methods Explanation;Demonstration;Verbal cues   Comprehension Verbal cues required;Returned demonstration;Verbalized understanding             OT Long Term Goals - 02/18/17 1125      OT LONG TERM GOAL #1   Title Patient will demonstrate methods of picking up her bookbag and placing it onto the back of her chair with modified independence to transport items to class.   Baseline requires assistance from others to complete now but will need to be independent when at college in the fall .   Time 6   Period Months   Status Achieved     OT LONG TERM GOAL #2   Title Patient will be able to make her bed with modified independence.   Baseline she continues to have difficulty with balance  and completing this task, increased time.   Time 6   Period Months   Status On-going     OT LONG TERM GOAL #3   Title Patient will demonstrate the ability to perform vacuuming of one room with modified independence using light weight vacuum.   Baseline still requires max assist   Time 6   Period Months   Status On-going     OT LONG TERM GOAL #4   Title Patient will demonstrate the ability to open containers with sealed tops such as on yogurt containers and individual ice cream containers.    Baseline increased difficulty with sealed, peel away container tops and requires assistance or modifications to open at times   Time 12   Period Weeks   Status On-going     OT LONG TERM GOAL #5   Title Patient will demonstrate the ability to perform small buttons on shirts  independently and with good speed in order to dress for school.   Baseline getting easier but if rushed she often does the wrong button holes and will have to have mom to redo.    Time 12   Period Weeks   Status On-going     OT LONG TERM GOAL #6   Title Patient will donn and doff small earrings with modified independence.   Baseline mod assist from mom, patient has difficulty holding and dropping with attempts, longer earring are easier.   Time 12   Period Weeks   Status On-going     OT LONG TERM GOAL #7   Title Patient will be able to zip her rainboots while they are on her feet with modified independence.     Baseline can operate zipper when not on feet but has difficulty when putting boots on to zip them.    Time 12   Period Weeks   Status New     OT LONG TERM GOAL #8   Title Patient to complete am self care demonstrating efficient methods of dressing skills to get to school on time.    Baseline often has to have her mom help her to get dressed to get to school on time.   Time 12   Period Weeks   Status New     OT LONG TERM GOAL  #9   Baseline Patient will be able to braid her own hair before college   Time 6   Period Months   Status Achieved     OT LONG TERM GOAL  #10   Time 6     OT LONG TERM GOAL  #11   TITLE Will be able to squeeze tooth paste out of the tube when it is less than half full.   Baseline difficulty with small travel sized.   Time 6   Period Months   Status On-going     OT LONG TERM GOAL  #12   TITLE Patient will demonstrate managing wheelchair rain poncho in inclement weather with modified independence.   Baseline crutch tips are difficult for anyone to take them off, goal deferred.   Time 6   Period Months   Status Achieved     OT LONG TERM GOAL  #13   TITLE Patient will be able to complete 2 1/2 minutes straight with left shoulder flexion simulating driving with a stearing wheel ball to be able to drive to college.    Baseline Patient has not  been driving, her family takes her to school and drops her off.  Time 6   Period Months   Status On-going     OT LONG TERM GOAL  #15   TITLE Patient will be able to obtain food tray in the cafeteria from a wheelchair level and take to the table without spilling with modified independence.    Baseline max assist   Time 6   Period Months   Status Achieved               Plan - 02/18/17 1125    Clinical Impression Statement Patient reports she is improving with manipulation of buttons on shirts, still has difficulty with zipping rain boots when they are on her feet.  Patient is going today to get a second piercing of ears and will need to be able to manage taking on and off, especially the small backs in order to clean her ear lobes after piercing.  She continues to work towards improving strength in bilateral upper extremities to perform daily self care tasks and attempting to progress to IADL tasks requiring increased balance such as making the bed.  She would like to focus on managing new earrings and rolling coins in coming sessions.  Pt continues to benefit from skilled OT to maximize independence in daily tasks.    Rehab Potential Good   OT Frequency 1x / week   OT Duration 12 weeks   OT Treatment/Interventions Self-care/ADL training;Therapeutic exercise;Therapeutic exercises;Therapeutic activities;DME and/or AE instruction;Patient/family education   Consulted and Agree with Plan of Care Patient      Patient will benefit from skilled therapeutic intervention in order to improve the following deficits and impairments:  Impaired UE functional use, Difficulty walking, Decreased strength, Decreased coordination, Decreased endurance, Decreased balance  Visit Diagnosis: Muscle weakness (generalized)  Other lack of coordination    Problem List There are no active problems to display for this patient.  Kerrie Buffalo, OTR/L, CLT  Sadey Yandell 02/18/2017, 3:56 PM  [facility] MAIN [facility] SERVICES [address] [place], [place], [address] Phone: [phone]   Fax:  [phone]  Name: LYRIQUE HAKIM MRN: [id number] Date of Birth: 06/03/1996

## 2017-02-25 ENCOUNTER — Ambulatory Visit: Payer: Medicaid Other | Admitting: Occupational Therapy

## 2017-02-25 ENCOUNTER — Ambulatory Visit: Payer: Medicaid Other

## 2017-02-25 ENCOUNTER — Encounter: Payer: Self-pay | Admitting: Occupational Therapy

## 2017-02-25 DIAGNOSIS — R262 Difficulty in walking, not elsewhere classified: Secondary | ICD-10-CM

## 2017-02-25 DIAGNOSIS — R278 Other lack of coordination: Secondary | ICD-10-CM

## 2017-02-25 DIAGNOSIS — M6281 Muscle weakness (generalized): Secondary | ICD-10-CM

## 2017-02-25 NOTE — Therapy (Signed)
[facility] MAIN [facility] SERVICES [address] [place], [place], [address] Phone: [phone]   Fax:  [phone]  Occupational Therapy Treatment  Patient Details  Name: Michelle Randall MRN: [id number] Date of Birth: 1996-06-26 No Data Recorded  Encounter Date: 02/25/2017      OT End of Session - 02/25/17 1609    Visit Number 70   Number of Visits 82   Date for OT Re-Evaluation 03/18/17   Authorization Type medicaid visit 49 of 60   OT Start Time 0926   OT Stop Time 1004   OT Time Calculation (min) 38 min   Activity Tolerance Patient tolerated treatment well   Behavior During Therapy [facility] for tasks assessed/performed      History reviewed. No pertinent past medical history.  History reviewed. No pertinent surgical history.  There were no vitals filed for this visit.      Subjective Assessment - 02/25/17 1606    Subjective  Patient reports she has a busy weekend, it part of a fund raising event at [employer] that starts at 4 pm today and does not end until 6 pm tomorrow.    Patient Stated Goals To be as independent as possible at home and at school.    Currently in Pain? No/denies   Pain Score 0-No pain                      OT Treatments/Exercises (OP) - 02/25/17 1610      Fine Motor Coordination   Other Fine Motor Exercises Patient seen this date for manipulation of cards for shuffling using 2 hands, dealing, sorting and setting the cards up to participate in a game.  Patient demonstrates some difficulty with picking up cards from a flat surface and occasionally has to pull cards to the edge to pick up.  Manipulation of small sticks for oppositional movements with thumb/index and thumb/middle finger alternating to pick  up from horizontal position and place into vertical position, cues for form and technique. Manipulation of nuts and bolts from elevated plane of motion with cues, variety of sizes from large to small.         Neurological Re-education Exercises   Other Exercises 1 Hand strength with gripper with 17# for 25 repetitions for sustained grip followed by a second set with 23# of grip, cues for placement of hands on hand gripper and technique to pick up items and move from one place to another.                 OT Education - 02/25/17 1608    Education provided Yes   Education Details coordination exercises with manipulation of cards    Person(s) Educated Patient   Methods Explanation;Demonstration;Verbal cues   Comprehension Verbalized understanding;Returned demonstration;Verbal cues required             OT Long Term Goals - 02/18/17 1125      OT LONG TERM GOAL #1   Title Patient will demonstrate methods of picking up her bookbag and placing it onto the back of her chair with modified independence to transport items to class.   Baseline requires assistance from others to complete now but will need to be independent when at college in the fall .   Time 6   Period Months   Status Achieved     OT LONG TERM GOAL #2   Title Patient will be able to make her bed with modified independence.  Baseline she continues to have difficulty with balance and completing this task, increased time.   Time 6   Period Months   Status On-going     OT LONG TERM GOAL #3   Title Patient will demonstrate the ability to perform vacuuming of one room with modified independence using light weight vacuum.   Baseline still requires max assist   Time 6   Period Months   Status On-going     OT LONG TERM GOAL #4   Title Patient will demonstrate the ability to open containers with sealed tops such as on yogurt containers and individual ice cream containers.    Baseline increased difficulty with sealed, peel away container tops and requires assistance or modifications to open at times   Time 12   Period Weeks   Status On-going     OT LONG TERM GOAL #5   Title Patient will demonstrate the ability to perform  small buttons on shirts independently and with good speed in order to dress for school.   Baseline getting easier but if rushed she often does the wrong button holes and will have to have mom to redo.    Time 12   Period Weeks   Status On-going     OT LONG TERM GOAL #6   Title Patient will donn and doff small earrings with modified independence.   Baseline mod assist from mom, patient has difficulty holding and dropping with attempts, longer earring are easier.   Time 12   Period Weeks   Status On-going     OT LONG TERM GOAL #7   Title Patient will be able to zip her rainboots while they are on her feet with modified independence.     Baseline can operate zipper when not on feet but has difficulty when putting boots on to zip them.    Time 12   Period Weeks   Status New     OT LONG TERM GOAL #8   Title Patient to complete am self care demonstrating efficient methods of dressing skills to get to school on time.    Baseline often has to have her mom help her to get dressed to get to school on time.   Time 12   Period Weeks   Status New     OT LONG TERM GOAL  #9   Baseline Patient will be able to braid her own hair before college   Time 6   Period Months   Status Achieved     OT LONG TERM GOAL  #10   Time 6     OT LONG TERM GOAL  #11   TITLE Will be able to squeeze tooth paste out of the tube when it is less than half full.   Baseline difficulty with small travel sized.   Time 6   Period Months   Status On-going     OT LONG TERM GOAL  #12   TITLE Patient will demonstrate managing wheelchair rain poncho in inclement weather with modified independence.   Baseline crutch tips are difficult for anyone to take them off, goal deferred.   Time 6   Period Months   Status Achieved     OT LONG TERM GOAL  #13   TITLE Patient will be able to complete 2 1/2 minutes straight with left shoulder flexion simulating driving with a stearing wheel ball to be able to drive to college.     Baseline Patient has not been driving, her family takes  her to school and drops her off.    Time 6   Period Months   Status On-going     OT LONG TERM GOAL  #15   TITLE Patient will be able to obtain food tray in the cafeteria from a wheelchair level and take to the table without spilling with modified independence.    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  She is improving with her fine motor coordination skills but continues to lack speed and dexterity with select tasks.  Continue to work towards goals to improve independence in necessary daily tasks.    Rehab Potential Good   OT Frequency 1x / week   OT Duration 12 weeks   OT Treatment/Interventions Self-care/ADL training;Therapeutic exercise;Therapeutic exercises;Therapeutic activities;DME and/or AE instruction;Patient/family education   Consulted and Agree with Plan of Care Patient      Patient will benefit from skilled therapeutic intervention in order to improve the following deficits and impairments:  Impaired UE functional use, Difficulty walking, Decreased strength, Decreased coordination, Decreased endurance, Decreased balance  Visit Diagnosis: Muscle weakness (generalized)  Other lack of coordination    Problem List There are no active problems to display for this patient.  Kerrie Buffalo, OTR/L, CLT  Garnell Phenix 02/25/2017, 4:17 PM   [facility] MAIN [facility] SERVICES [address] [place], [place], [address] Phone: [phone]   Fax:  [phone]  Name: MESSIAH ROVIRA MRN: [id number] Date of Birth: 1996/07/14

## 2017-02-25 NOTE — Therapy (Signed)
[place] MAIN [facility] SERVICES [address] [place], [place], [address] Phone: [phone]   Fax:  [phone]  Physical Therapy Treatment  Patient Details  Name: Michelle Randall MRN: [id number] Date of Birth: 1996-07-12 No Data Recorded  Encounter Date: 02/25/2017      PT End of Session - 02/25/17 1027    Visit Number 78   Number of Visits 45   Date for PT Re-Evaluation 04/27/17   Authorization Type medicaid   PT Start Time 1004   PT Stop Time 1045   PT Time Calculation (min) 41 min   Equipment Utilized During Treatment Gait belt   Activity Tolerance Patient tolerated treatment well;Patient limited by fatigue   Behavior During Therapy [facility] for tasks assessed/performed      History reviewed. No pertinent past medical history.  History reviewed. No pertinent surgical history.  There were no vitals filed for this visit.      Subjective Assessment - 02/25/17 1022    Subjective Patient reports no major changes since her previous visit. Patient states she had to do a lot of walking over the weekend.    Patient is accompained by: Family member   Limitations Walking   Patient Stated Goals Patient wants to improve her core strength.    Currently in Pain? No/denies   Pain Onset In the past 7 days       TREATMENT  Therapeutic Exercise Nustep level 5 - 6.5 min with cueing on speed and performance Penguin crunches in hooklying - 2 x 20 Bridges with PT holding feet - x 20  Deadbug in hooklying with leg extension with heel strike - x20 Heel to toe in supine - x 20  Planks in prone on elbows - 3 x 30sec Step ups to 8" step with airex pad on top - x10 B LE Side stepping up 8" in  bars - x 20  Step taps with UE support onto 8" step - x 20        PT Education - 02/25/17 1026    Education provided Yes   Education Details form/technique with exercise   Person(s) Educated Patient   Methods Explanation;Demonstration   Comprehension  Verbalized understanding;Returned demonstration             PT Long Term Goals - 01/28/17 1104      PT LONG TERM GOAL #1   Title Patient will improve Dynamic Gait Index (DGI) score to > 19/24 for low falls risk regarding dynamic walking tasks    Baseline 14/24; 11/02/16: 15/24 01/28/17: 18/24   Time 12   Period Weeks   Status On-going     PT LONG TERM GOAL #2   Title Patient will increase Berg Balance score by > 6 points to demonstrate decreased fall risk during functional activities    Baseline 11/02/16: 34/56 01/28/17: 39/56   Time 12   Period Weeks   Status On-going     PT LONG TERM GOAL #3   Title Patient will be able to transfer in and out of a large car with Patient will be able to transfer in and out of a high seated car with CGA    Baseline Patient requires min for transfer into large car   Time 12   Period Weeks   Status On-going     PT LONG TERM GOAL #4   Title Patient will complete a TUG test in < 12 seconds for independent mobility and decreased fall risk  Baseline 11.45   Time 12   Period Weeks   Status Achieved     PT LONG TERM GOAL #5   Title Patient will improve 6 minute walk distance by > 150 ft for improved return to functional community activities    Baseline 740 01/28/2017: 779ft   Time 12   Status On-going     PT LONG TERM GOAL #6   Title Patient will improve gait speed to > 1.2 m/s with least restrictive assistive device to return to normal walking speed    Baseline .76 m/h 01/28/2017: .87   Time 12   Period Weeks   Status On-going     PT LONG TERM GOAL #7   Title Patient (< 88 years old) will complete five times sit to stand test in < 10 seconds indicating an increased LE strength and improved balance    Baseline 9.45 sec   Time 12   Period Weeks   Status Achieved     PT LONG TERM GOAL #8   Title Patient will be able to ambulate on inclines and grass independenlty with LRAD   Baseline Can ambulate over smooth grass but unable to safely  amb over heavy uneven surfaces   Time 12   Period Weeks   Status On-going     PT LONG TERM GOAL  #9   TITLE Patient will be abe to transfer from low chair or stool wihtout UE support independently   Baseline Patient needs min asssit to transfer from a low stool   Period Weeks   Status Achieved     PT LONG TERM GOAL  #10   TITLE Patient will be able to transfer from the floor to standing independently with use of LRAD   Baseline Patient needs min assist to transfer from the floor to standing   Time 12   Period Weeks   Status On-going               Plan - 02/25/17 1034    Clinical Impression Statement Patient demonstrates decreased LE strength and core endurance requiring frequent breaks when performing supine core activties. Patient will benefit from further skilled therapy focused on improving, LE and core strength to improve walking ability.    Rehab Potential Good   Clinical Impairments Affecting Rehab Potential weakness and decreased standing balance   PT Frequency 1x / week   PT Duration 12 weeks   PT Treatment/Interventions Therapeutic exercise;Therapeutic activities;Gait training;Balance training;Stair training;DME Instruction;Neuromuscular re-education;Patient/family education   PT Next Visit Plan Bil knee flexion/extension strengthening; Progress LE, core strengthening and balance   PT Home Exercise Plan Continued from previous sessions.    Consulted and Agree with Plan of Care Patient      Patient will benefit from skilled therapeutic intervention in order to improve the following deficits and impairments:  Abnormal gait, Decreased balance, Decreased endurance, Difficulty walking, Decreased strength  Visit Diagnosis: Muscle weakness (generalized)  Other lack of coordination  Difficulty in walking, not elsewhere classified     Problem List There are no active problems to display for this patient.   Myrene Galas, PT DPT 02/25/2017, 10:57 AM  [facility] MAIN [facility] SERVICES [address] [place], [place], [address] Phone: [phone]   Fax:  [phone]  Name: Michelle Randall MRN: [id number] Date of Birth: 01/11/1996

## 2017-03-04 ENCOUNTER — Encounter: Payer: Self-pay | Admitting: Occupational Therapy

## 2017-03-04 ENCOUNTER — Ambulatory Visit: Payer: Medicaid Other

## 2017-03-04 ENCOUNTER — Ambulatory Visit: Payer: Medicaid Other | Admitting: Occupational Therapy

## 2017-03-04 DIAGNOSIS — R278 Other lack of coordination: Secondary | ICD-10-CM

## 2017-03-04 DIAGNOSIS — M6281 Muscle weakness (generalized): Secondary | ICD-10-CM | POA: Diagnosis not present

## 2017-03-04 DIAGNOSIS — R262 Difficulty in walking, not elsewhere classified: Secondary | ICD-10-CM

## 2017-03-04 NOTE — Therapy (Signed)
[place] [facility] MAIN [facility] SERVICES [address] [place], [place], [address] Phone: [phone]   Fax:  [phone]  Occupational Therapy Treatment  Patient Details  Name: Michelle Randall MRN: [id number] Date of Birth: 1996-05-08 No Data Recorded  Encounter Date: 03/04/2017      OT End of Session - 03/04/17 1011    Visit Number 71   Number of Visits 82   Date for OT Re-Evaluation 03/18/17   Authorization Type medicaid visit 50 of 60   OT Start Time 0914   OT Stop Time 1000   OT Time Calculation (min) 46 min   Activity Tolerance Patient tolerated treatment well   Behavior During Therapy [facility] for tasks assessed/performed      History reviewed. No pertinent past medical history.  History reviewed. No pertinent surgical history.  There were no vitals filed for this visit.      Subjective Assessment - 03/04/17 0920    Subjective  Patient reports her dance a thon last weekend, it was 24 hours total.  Raised $452, 964.18 plus $10,000 from a donor.  Raised $45,000 in 24 hours with coin drive for [employer] and [employer],   Patient Stated Goals To be as independent as possible at home and at school.    Currently in Pain? No/denies   Pain Score 0-No pain                      OT Treatments/Exercises (OP) - 03/04/17 1007      Fine Motor Coordination   Other Fine Motor Exercises Coordination task of counting out coins from tabletop, rolling them into holders and folding ends of wrappers, cues for technique and use of both hands/fingers to manage the rolls and coins.  Instructed on adaptive technique with use of coin towers to help hold and maintain coins, patient able to complete with min assist to get paper wrappers into slots, may need additional instruction to become proficient in use but likes this technique.       Neurological Re-education Exercises   Other Exercises 1 Patient seen for resistive pinch  patterns for lateral and 3 point pinch with cues for type of pinch and technique, resistive pins from yellow to black, placed in elevated plane of motion to encourage reaching of right and left UE.                 OT Education - 03/04/17 1011    Education provided Yes   Education Details coin rolling, coin tubes and wrapper use.    Person(s) Educated Patient   Methods Explanation;Demonstration;Verbal cues   Comprehension Verbal cues required;Verbalized understanding;Returned demonstration             OT Long Term Goals - 03/04/17 1014      OT LONG TERM GOAL #1   Title Patient will demonstrate methods of picking up her bookbag and placing it onto the back of her chair with modified independence to transport items to class.   Baseline requires assistance from others to complete now but will need to be independent when at college in the fall .   Time 6   Period Months   Status Achieved     OT LONG TERM GOAL #2   Title Patient will be able to make her bed with modified independence.   Baseline she continues to have difficulty with balance and completing this task, increased time.   Time 6   Period  Months   Status On-going     OT LONG TERM GOAL #3   Title Patient will demonstrate the ability to perform vacuuming of one room with modified independence using light weight vacuum.   Baseline still requires max assist   Time 6   Period Months   Status On-going     OT LONG TERM GOAL #4   Title Patient will demonstrate the ability to open containers with sealed tops such as on yogurt containers and individual ice cream containers.    Baseline increased difficulty with sealed, peel away container tops and requires assistance or modifications to open at times   Time 12   Period Weeks   Status On-going     OT LONG TERM GOAL #5   Title Patient will demonstrate the ability to perform small buttons on shirts independently and with good speed in order to dress for school.    Baseline getting easier but if rushed she often does the wrong button holes and will have to have mom to redo.    Time 12   Period Weeks   Status On-going     OT LONG TERM GOAL #6   Title Patient will donn and doff small earrings with modified independence.   Baseline mod assist from mom, patient has difficulty holding and dropping with attempts, longer earring are easier.   Time 12   Period Weeks   Status On-going     OT LONG TERM GOAL #7   Title Patient will be able to zip her rainboots while they are on her feet with modified independence.     Baseline can operate zipper when not on feet but has difficulty when putting boots on to zip them.    Time 12   Period Weeks   Status New     OT LONG TERM GOAL #8   Title Patient to complete am self care demonstrating efficient methods of dressing skills to get to school on time.    Baseline often has to have her mom help her to get dressed to get to school on time.   Time 12   Period Weeks   Status New     OT LONG TERM GOAL  #9   Baseline Patient will be able to braid her own hair before college   Time 6   Period Months   Status Achieved     OT LONG TERM GOAL  #10   Time 6     OT LONG TERM GOAL  #11   TITLE Will be able to squeeze tooth paste out of the tube when it is less than half full.   Baseline difficulty with small travel sized.   Time 6   Period Months   Status On-going     OT LONG TERM GOAL  #12   TITLE Patient will demonstrate managing wheelchair rain poncho in inclement weather with modified independence.   Baseline crutch tips are difficult for anyone to take them off, goal deferred.   Time 6   Period Months   Status Achieved     OT LONG TERM GOAL  #13   TITLE Patient will be able to complete 2 1/2 minutes straight with left shoulder flexion simulating driving with a stearing wheel ball to be able to drive to college.    Baseline Patient has not been driving, her family takes her to school and drops her off.     Time 6   Period Months   Status On-going  OT LONG TERM GOAL  #15   TITLE Patient will be able to obtain food tray in the cafeteria from a wheelchair level and take to the table without spilling with modified independence.    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  She does well with adapted method of using coin tubes which is faster for her. She would benefit from additional instruction to become proficient with use.     Rehab Potential Good   OT Frequency 1x / week   OT Duration 12 weeks   OT Treatment/Interventions Self-care/ADL training;Therapeutic exercise;Therapeutic exercises;Therapeutic activities;DME and/or AE instruction;Patient/family education   Consulted and Agree with Plan of Care Patient      Patient will benefit from skilled therapeutic intervention in order to improve the following deficits and impairments:  Impaired UE functional use, Difficulty walking, Decreased strength, Decreased coordination, Decreased endurance, Decreased balance  Visit Diagnosis: Muscle weakness (generalized)  Other lack of coordination    Problem List There are no active problems to display for this patient.  Kerrie Buffalo, OTR/L, CLT  Eri Platten 03/04/2017, 10:15 AM  [place] [facility] MAIN [facility] SERVICES [address] [place], [place], [address] Phone: [phone]   Fax:  [phone]  Name: Michelle Randall MRN: [id number] Date of Birth: 1995-11-28

## 2017-03-04 NOTE — Therapy (Signed)
[facility] MAIN [facility] SERVICES [address] [place], [place], [address] Phone: [phone]   Fax:  [phone]  Physical Therapy Treatment  Patient Details  Name: Michelle Randall MRN: [id number] Date of Birth: [birth year] No Data Recorded  Encounter Date: 03/04/2017      PT End of Session - 03/04/17 1038    Visit Number 79   Number of Visits 45   Date for PT Re-Evaluation 04/27/17   Authorization Type medicaid   PT Start Time 1002   PT Stop Time 1045   PT Time Calculation (min) 43 min   Equipment Utilized During Treatment Gait belt   Activity Tolerance Patient tolerated treatment well;Patient limited by fatigue   Behavior During Therapy [facility] for tasks assessed/performed      History reviewed. No pertinent past medical history.  History reviewed. No pertinent surgical history.  There were no vitals filed for this visit.      Subjective Assessment - 03/04/17 1006    Subjective Patient reports she is preparing to for her orientation leader weekend which requires increased walking. Patient reports she had to perform increased standing over the weekend at the [employer] at [employer].   Patient is accompained by: Family member   Limitations Walking   Patient Stated Goals Patient wants to improve her core strength.    Currently in Pain? No/denies   Pain Onset In the past 7 days       TREATMENT  Therapeutic Exercise Penguin crunches in hooklying - 2 x 20 Bridges with PT holding feet - x 20  Thoracic crunches in supine - x 20 Deadbug in hooklying with leg extension with heel strike - 2x20 Bilateral shoulder flexion with legs elevated with 5# weighted stick in supine - x 20  Planks in prone on elbows - 3 x 30sec Steps to and from half foam rollers - x30 back and forth Step ups to 8" step from half foam roller - x10 B LE Side stepping over half foam - x10 over two foam roller Standing trunk rotations with 2# with feet together - x 10  each direction  Forward/backward stepping over half foam rollers - x10       PT Education - 03/04/17 1037    Education provided Yes   Education Details form/technique with exercise   Person(s) Educated Patient   Methods Explanation;Demonstration   Comprehension Verbalized understanding;Returned demonstration             PT Long Term Goals - 01/28/17 1104      PT LONG TERM GOAL #1   Title Patient will improve Dynamic Gait Index (DGI) score to > 19/24 for low falls risk regarding dynamic walking tasks    Baseline 14/24; 11/02/16: 15/24 01/28/17: 18/24   Time 12   Period Weeks   Status On-going     PT LONG TERM GOAL #2   Title Patient will increase Berg Balance score by > 6 points to demonstrate decreased fall risk during functional activities    Baseline 11/02/16: 34/56 01/28/17: 39/56   Time 12   Period Weeks   Status On-going     PT LONG TERM GOAL #3   Title Patient will be able to transfer in and out of a large car with Patient will be able to transfer in and out of a high seated car with CGA    Baseline Patient requires min for transfer into large car   Time 12   Period Weeks  Status On-going     PT LONG TERM GOAL #4   Title Patient will complete a TUG test in < 12 seconds for independent mobility and decreased fall risk    Baseline 11.45   Time 12   Period Weeks   Status Achieved     PT LONG TERM GOAL #5   Title Patient will improve 6 minute walk distance by > 150 ft for improved return to functional community activities    Baseline 740 01/28/2017: 753ft   Time 12   Status On-going     PT LONG TERM GOAL #6   Title Patient will improve gait speed to > 1.2 m/s with least restrictive assistive device to return to normal walking speed    Baseline .76 m/h 01/28/2017: .87   Time 12   Period Weeks   Status On-going     PT LONG TERM GOAL #7   Title Patient (< 6 years old) will complete five times sit to stand test in < 10 seconds indicating an increased LE  strength and improved balance    Baseline 9.45 sec   Time 12   Period Weeks   Status Achieved     PT LONG TERM GOAL #8   Title Patient will be able to ambulate on inclines and grass independenlty with LRAD   Baseline Can ambulate over smooth grass but unable to safely amb over heavy uneven surfaces   Time 12   Period Weeks   Status On-going     PT LONG TERM GOAL  #9   TITLE Patient will be abe to transfer from low chair or stool wihtout UE support independently   Baseline Patient needs min asssit to transfer from a low stool   Period Weeks   Status Achieved     PT LONG TERM GOAL  #10   TITLE Patient will be able to transfer from the floor to standing independently with use of LRAD   Baseline Patient needs min assist to transfer from the floor to standing   Time 12   Period Weeks   Status On-going               Plan - 03/04/17 1041    Clinical Impression Statement Patient demonstrates improved musuclar endurance with ability to perform greater amount of exercises before onset of fatigue. Patient Demonstrates decreased standing balance with LOB  x 2 when performing static stance balance and patient will benefit from further skilled therapy to return to decreased fall risk.    Rehab Potential Good   Clinical Impairments Affecting Rehab Potential weakness and decreased standing balance   PT Frequency 1x / week   PT Duration 12 weeks   PT Treatment/Interventions Therapeutic exercise;Therapeutic activities;Gait training;Balance training;Stair training;DME Instruction;Neuromuscular re-education;Patient/family education   PT Next Visit Plan Bil knee flexion/extension strengthening; Progress LE, core strengthening and balance   PT Home Exercise Plan Continued from previous sessions.    Consulted and Agree with Plan of Care Patient      Patient will benefit from skilled therapeutic intervention in order to improve the following deficits and impairments:  Abnormal gait, Decreased  balance, Decreased endurance, Difficulty walking, Decreased strength  Visit Diagnosis: Muscle weakness (generalized)  Other lack of coordination  Difficulty in walking, not elsewhere classified     Problem List There are no active problems to display for this patient.   Myrene Galas, PT DPT 03/04/2017, 10:57 AM  [place] [facility] MAIN [facility] SERVICES [address]  [place], [place], [address] Phone: [phone]   Fax:  [phone]  Name: Michelle Randall MRN: [id number] Date of Birth: 1996/03/01

## 2017-03-07 ENCOUNTER — Ambulatory Visit: Payer: Medicaid Other

## 2017-03-07 ENCOUNTER — Encounter: Payer: Medicaid Other | Admitting: Occupational Therapy

## 2017-03-08 ENCOUNTER — Ambulatory Visit: Payer: Medicaid Other | Admitting: Physical Therapy

## 2017-03-08 DIAGNOSIS — R262 Difficulty in walking, not elsewhere classified: Secondary | ICD-10-CM

## 2017-03-08 DIAGNOSIS — R278 Other lack of coordination: Secondary | ICD-10-CM

## 2017-03-08 DIAGNOSIS — M6281 Muscle weakness (generalized): Secondary | ICD-10-CM | POA: Diagnosis not present

## 2017-03-08 NOTE — Therapy (Signed)
[facility] MAIN [facility] SERVICES [address] [place], [place], [address] Phone: [phone]   Fax:  [phone]  Physical Therapy Treatment  Patient Details  Name: Michelle Randall MRN: [id number] Date of Birth: 1995-11-20 No Data Recorded  Encounter Date: 03/08/2017      PT End of Session - 03/08/17 0917    Visit Number 80   Number of Visits 45   Date for PT Re-Evaluation 04/27/17   Authorization Type medicaid   PT Start Time 0915   PT Stop Time 0959   PT Time Calculation (min) 44 min   Equipment Utilized During Treatment Gait belt   Activity Tolerance Patient tolerated treatment well;Patient limited by fatigue   Behavior During Therapy [facility] for tasks assessed/performed      History reviewed. No pertinent past medical history.  History reviewed. No pertinent surgical history.  There were no vitals filed for this visit.      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  No new changes since last session.   Patient is accompained by: Family member   Limitations Walking   Patient Stated Goals Patient wants to improve her core strength.    Currently in Pain? No/denies   Multiple Pain Sites No        TREATMENT    Therapeutic Exercise:  Penguin crunches in hooklying - 2 x 20  Abdominal twists in hooklying 2x20  Bridges with PT holding feet - x 20  Thoracic crunches in supine - x 20  Deadbug in hooklying with leg extension with heel strike - 2x20  Planks in prone on elbows - 3 x 35sec  Prone on theraball and alternating hip extensions x10 each LE  Seated LAQ with 7# ankle weights around lower legs x20 each LE  Alternating toe taps up to 8" step with 7# ankle weights around lower legs x20 each LE  Side stepping over half foam - x10 over two foam roller  Forward stepping over half foam rollers - x10            PT  Education - 03/08/17 0917    Education provided Yes   Education Details Exercise technique   Person(s) Educated Patient   Methods Explanation;Demonstration   Comprehension Verbalized understanding;Returned demonstration;Need further instruction             PT Long Term Goals - 01/28/17 1104      PT LONG TERM GOAL #1   Title Patient will improve Dynamic Gait Index (DGI) score to > 19/24 for low falls risk regarding dynamic walking tasks    Baseline 14/24; 11/02/16: 15/24 01/28/17: 18/24   Time 12   Period Weeks   Status On-going     PT LONG TERM GOAL #2   Title Patient will increase Berg Balance score by > 6 points to demonstrate decreased fall risk during functional activities    Baseline 11/02/16: 34/56 01/28/17: 39/56   Time 12   Period Weeks   Status On-going     PT LONG TERM GOAL #3   Title Patient will be able to transfer in and out of a large car with Patient will be able to transfer in and out of a high seated car with CGA    Baseline Patient requires min for transfer into large car   Time 12   Period Weeks   Status On-going  PT LONG TERM GOAL #4   Title Patient will complete a TUG test in < 12 seconds for independent mobility and decreased fall risk    Baseline 11.45   Time 12   Period Weeks   Status Achieved     PT LONG TERM GOAL #5   Title Patient will improve 6 minute walk distance by > 150 ft for improved return to functional community activities    Baseline 740 01/28/2017: 743ft   Time 12   Status On-going     PT LONG TERM GOAL #6   Title Patient will improve gait speed to > 1.2 m/s with least restrictive assistive device to return to normal walking speed    Baseline .76 m/h 01/28/2017: .87   Time 12   Period Weeks   Status On-going     PT LONG TERM GOAL #7   Title Patient (< 7 years old) will complete five times sit to stand test in < 10 seconds indicating an increased LE strength and improved balance    Baseline 9.45 sec   Time 12   Period  Weeks   Status Achieved     PT LONG TERM GOAL #8   Title Patient will be able to ambulate on inclines and grass independenlty with LRAD   Baseline Can ambulate over smooth grass but unable to safely amb over heavy uneven surfaces   Time 12   Period Weeks   Status On-going     PT LONG TERM GOAL  #9   TITLE Patient will be abe to transfer from low chair or stool wihtout UE support independently   Baseline Patient needs min asssit to transfer from a low stool   Period Weeks   Status Achieved     PT LONG TERM GOAL  #10   TITLE Patient will be able to transfer from the floor to standing independently with use of LRAD   Baseline Patient needs min assist to transfer from the floor to standing   Time 12   Period Weeks   Status On-going               Plan - 03/08/17 0932    Clinical Impression Statement Pt tolerated all exercises well this session.  She demonstrates fatigue with core strenthening exercises and LE strengthening exercises.  She performs balance exercises with instability at times, requiring // bars for support.  She will continue to benefit from continued skilled PT interventions for improved strength, endurance, balance, and QOL.   Rehab Potential Good   Clinical Impairments Affecting Rehab Potential weakness and decreased standing balance   PT Frequency 1x / week   PT Duration 12 weeks   PT Treatment/Interventions Therapeutic exercise;Therapeutic activities;Gait training;Balance training;Stair training;DME Instruction;Neuromuscular re-education;Patient/family education   PT Next Visit Plan Bil knee flexion/extension strengthening; Progress LE, core strengthening and balance   PT Home Exercise Plan Continued from previous sessions.    Consulted and Agree with Plan of Care Patient      Patient will benefit from skilled therapeutic intervention in order to improve the following deficits and impairments:  Abnormal gait, Decreased balance, Decreased endurance,  Difficulty walking, Decreased strength  Visit Diagnosis: Muscle weakness (generalized)  Other lack of coordination  Difficulty in walking, not elsewhere classified     Problem List There are no active problems to display for this patient.   Encarnacion Chu PT, DPT 03/08/2017, 10:00 AM   [facility] [facility] MAIN [facility] SERVICES [address] [place], [place],  [address] Phone: [phone]   Fax:  [phone]  Name: Michelle Randall MRN: [id number] Date of Birth: 02/06/1996

## 2017-03-09 ENCOUNTER — Ambulatory Visit: Payer: Medicaid Other

## 2017-03-09 ENCOUNTER — Encounter: Payer: Medicaid Other | Admitting: Occupational Therapy

## 2017-03-11 ENCOUNTER — Ambulatory Visit: Payer: Medicaid Other

## 2017-03-11 ENCOUNTER — Encounter: Payer: Self-pay | Admitting: Occupational Therapy

## 2017-03-11 ENCOUNTER — Ambulatory Visit: Payer: Medicaid Other | Admitting: Occupational Therapy

## 2017-03-11 DIAGNOSIS — M6281 Muscle weakness (generalized): Secondary | ICD-10-CM | POA: Diagnosis not present

## 2017-03-11 DIAGNOSIS — R278 Other lack of coordination: Secondary | ICD-10-CM

## 2017-03-11 NOTE — Therapy (Signed)
[facility] MAIN [facility] SERVICES [address] [place], [place], [address] Phone: [phone]   Fax:  [phone]  Occupational Therapy Treatment  Patient Details  Name: Michelle Randall MRN: [id number] Date of Birth: 1996/10/26 No Data Recorded  Encounter Date: 03/11/2017      OT End of Session - 03/11/17 0933    Visit Number 72   Number of Visits 82   Date for OT Re-Evaluation 03/18/17   Authorization Type medicaid visit 51 of 60   OT Start Time [id number]   OT Stop Time 1001   OT Time Calculation (min) 43 min   Activity Tolerance Patient tolerated treatment well   Behavior During Therapy [facility] for tasks assessed/performed      History reviewed. No pertinent past medical history.  History reviewed. No pertinent surgical history.  There were no vitals filed for this visit.      Subjective Assessment - 03/11/17 0929    Subjective  Patient reports she has to go to school today for a stats project.  Tomorrow she is going to a tree planting for a student who passed away.  Only 2 more weeks of school.     Patient is accompained by: Family member   Patient Stated Goals To be as independent as possible at home and at school.    Currently in Pain? No/denies   Pain Score 0-No pain                      OT Treatments/Exercises (OP) - 03/11/17 2595      Fine Motor Coordination   Other Fine Motor Exercises Patient seen for fine motor coordination activities to include:  manipulation of glass beads, one side flat bottom, other side rounded, picking up and moving to palm and then moving to fingertips and placing into container with cues for technique. Patient seen for manipulation of purdue pegboard pieces with dowels, washers and collars, small in size.  cues for prehension patterns and speed.      Neurological Re-education Exercises   Other Exercises 1 Patient seen for UBE for UB strengthening with resistance of 3.8 to 4.0 for 8 minutes  with therapist in constant attendance to adjust settings and ensure grip.  Sustained grip strength 17.9# for 25 repetitions then, 23.4# for 25 repetitions.                 OT Education - 03/11/17 0932    Education provided Yes   Education Details exercises for home for fine motor coordination (manipulation of small objects 1/2 inch or smaller in size)   Person(s) Educated Patient   Methods Explanation;Demonstration;Verbal cues   Comprehension Verbal cues required;Returned demonstration;Verbalized understanding             OT Long Term Goals - 03/11/17 0934      OT LONG TERM GOAL #1   Title Patient will demonstrate methods of picking up her bookbag and placing it onto the back of her chair with modified independence to transport items to class.   Baseline requires assistance from others to complete now but will need to be independent when at college in the fall .   Time 6   Period Months   Status Achieved     OT LONG TERM GOAL #2   Title Patient will be able to make her bed with modified independence.   Baseline she continues to have difficulty with balance and completing this task, increased time.  Time 6   Period Months   Status On-going     OT LONG TERM GOAL #3   Title Patient will demonstrate the ability to perform vacuuming of one room with modified independence using light weight vacuum.   Baseline still requires max assist   Time 6   Period Months   Status On-going     OT LONG TERM GOAL #4   Title Patient will demonstrate the ability to open containers with sealed tops such as on yogurt containers and individual ice cream containers.    Baseline increased difficulty with sealed, peel away container tops and requires assistance or modifications to open at times   Time 12   Period Weeks   Status On-going     OT LONG TERM GOAL #5   Title Patient will demonstrate the ability to perform small buttons on shirts independently and with good speed in order to  dress for school.   Baseline getting easier but if rushed she often does the wrong button holes and will have to have mom to redo.    Time 12   Period Weeks   Status On-going     OT LONG TERM GOAL #6   Title Patient will donn and doff small earrings with modified independence.   Baseline mod assist from mom, patient has difficulty holding and dropping with attempts, longer earring are easier.   Time 12   Period Weeks   Status On-going     OT LONG TERM GOAL #7   Title Patient will be able to zip her rainboots while they are on her feet with modified independence.     Baseline can operate zipper when not on feet but has difficulty when putting boots on to zip them.    Time 12   Period Weeks   Status New     OT LONG TERM GOAL #8   Title Patient to complete am self care demonstrating efficient methods of dressing skills to get to school on time.    Baseline often has to have her mom help her to get dressed to get to school on time.   Time 12   Period Weeks   Status New     OT LONG TERM GOAL  #9   Baseline Patient will be able to braid her own hair before college   Time 6   Period Months   Status Achieved     OT LONG TERM GOAL  #10   Time 6     OT LONG TERM GOAL  #11   TITLE Will be able to squeeze tooth paste out of the tube when it is less than half full.   Baseline difficulty with small travel sized.   Time 6   Period Months   Status On-going     OT LONG TERM GOAL  #12   TITLE Patient will demonstrate managing wheelchair rain poncho in inclement weather with modified independence.   Baseline crutch tips are difficult for anyone to take them off, goal deferred.   Time 6   Period Months   Status Achieved     OT LONG TERM GOAL  #13   TITLE Patient will be able to complete 2 1/2 minutes straight with left shoulder flexion simulating driving with a stearing wheel ball to be able to drive to college.    Baseline Patient has not been driving, her family takes her to school  and drops her off.    Time 6   Period Months  Status On-going     OT LONG TERM GOAL  #15   TITLE Patient will be able to obtain food tray in the cafeteria from a wheelchair level and take to the table without spilling with modified independence.    Baseline max assist   Time 6   Period Months   Status Achieved               Plan - 03/11/17 0933    Clinical Impression Statement Patient requires verbal cues for prehension patterns when focusing on speed and dexterity of bilateral hand use.  Strength continues to improve in grip and shoulders but fatigues easily with repeated reaching tasks. Continue to work towards goals to increase independence in daily tasks.    Rehab Potential Good   OT Frequency 1x / week   OT Duration 12 weeks   OT Treatment/Interventions Self-care/ADL training;Therapeutic exercise;Therapeutic exercises;Therapeutic activities;DME and/or AE instruction;Patient/family education   Consulted and Agree with Plan of Care Patient      Patient will benefit from skilled therapeutic intervention in order to improve the following deficits and impairments:  Impaired UE functional use, Difficulty walking, Decreased strength, Decreased coordination, Decreased endurance, Decreased balance  Visit Diagnosis: Muscle weakness (generalized)  Other lack of coordination    Problem List There are no active problems to display for this patient.  Kerrie Buffalo, OTR/L, CLT  Lovett,Amy 03/11/2017, 10:14 AM  [place] [facility] MAIN [facility] SERVICES [address] [place], [place], [address] Phone: [phone]   Fax:  [phone]  Name: Michelle Randall MRN: [id number] Date of Birth: 04/07/1996

## 2017-03-18 ENCOUNTER — Encounter: Payer: Self-pay | Admitting: Occupational Therapy

## 2017-03-18 ENCOUNTER — Ambulatory Visit: Payer: Medicaid Other

## 2017-03-18 ENCOUNTER — Ambulatory Visit: Payer: Medicaid Other | Attending: Pediatrics | Admitting: Occupational Therapy

## 2017-03-18 DIAGNOSIS — R278 Other lack of coordination: Secondary | ICD-10-CM | POA: Diagnosis present

## 2017-03-18 DIAGNOSIS — R262 Difficulty in walking, not elsewhere classified: Secondary | ICD-10-CM | POA: Insufficient documentation

## 2017-03-18 DIAGNOSIS — M6281 Muscle weakness (generalized): Secondary | ICD-10-CM | POA: Insufficient documentation

## 2017-03-18 NOTE — Therapy (Signed)
[facility] [facility] [address] [place], [place], [address] Phone: [phone]   Fax:  [phone]  Physical Therapy Treatment  Patient Details  Name: Michelle Randall MRN: [id number] Date of Birth: [birth year] No Data Recorded  Encounter Date: 03/18/2017      PT End of Session - 03/18/17 1021    Visit Number 81   Number of Visits 45   Date for PT Re-Evaluation 04/27/17   Authorization Type medicaid   PT Start Time 1000   PT Stop Time 1045   PT Time Calculation (min) 45 min   Equipment Utilized During Treatment Gait belt   Activity Tolerance Patient tolerated treatment well;Patient limited by fatigue   Behavior [facility] for tasks assessed/performed      History reviewed. No pertinent past medical history.  History reviewed. No pertinent surgical history.  There were no vitals filed for this visit.      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 Patient states she wouuld like to address improving her core stabilization.    Patient is accompained by: Family member   Limitations Walking   Patient Stated Goals Patient wants to improve her core strength.    Currently in Pain? No/denies      TREATMENT  Therapeutic Exercise Penguin crunches in hooklying - 2 x 20 Bridges with PT holding feet - x 20  Feet elevated foot circles - x 10 cw/ccw Deadbug in hooklying with leg extension with heel strike - 2x20 SLR with knee bent for quad activation - x10 Planks in prone on elbows - 4 x 30sec Double airex forward step ups - x 10 each side with march while standing on top on pad Side stepping up and over double airex - x10 with UE support Side stepping over half foam roller - x10 down and back  Semi tandem stance on flat ground - 30sec x 3        PT Education - 03/18/17 1019    Education provided Yes   Education  Details Form/technique with exercise performance and technique   Person(s) Educated Patient   Methods Explanation;Demonstration   Comprehension Verbalized understanding;Returned demonstration             PT Long Term Goals - 01/28/17 1104      PT LONG TERM GOAL #1   Title Patient will improve Dynamic Gait Index (DGI) score to > 19/24 for low falls risk regarding dynamic walking tasks    Baseline 14/24; 11/02/16: 15/24 01/28/17: 18/24   Time 12   Period Weeks   Status On-going     PT LONG TERM GOAL #2   Title Patient will increase Berg Balance score by > 6 points to demonstrate decreased fall risk during functional activities    Baseline 11/02/16: 34/56 01/28/17: 39/56   Time 12   Period Weeks   Status On-going     PT LONG TERM GOAL #3   Title Patient will be able to transfer in and out of a large car with Patient will be able to transfer in and out of a high seated car with CGA    Baseline Patient requires min for transfer into large car   Time 12   Period Weeks   Status On-going     PT LONG TERM GOAL #4   Title Patient will complete a TUG test in < 12 seconds for  independent mobility and decreased fall risk    Baseline 11.45   Time 12   Period Weeks   Status Achieved     PT LONG TERM GOAL #5   Title Patient will improve 6 minute walk distance by > 150 ft for improved return to functional community activities    Baseline 740 01/28/2017: 755ft   Time 12   Status On-going     PT LONG TERM GOAL #6   Title Patient will improve gait speed to > 1.2 m/s with least restrictive assistive device to return to normal walking speed    Baseline .76 m/h 01/28/2017: .87   Time 12   Period Weeks   Status On-going     PT LONG TERM GOAL #7   Title Patient (< 66 years old) will complete five times sit to stand test in < 10 seconds indicating an increased LE strength and improved balance    Baseline 9.45 sec   Time 12   Period Weeks   Status Achieved     PT LONG TERM GOAL #8    Title Patient will be able to ambulate on inclines and grass independenlty with LRAD   Baseline Can ambulate over smooth grass but unable to safely amb over heavy uneven surfaces   Time 12   Period Weeks   Status On-going     PT LONG TERM GOAL  #9   TITLE Patient will be abe to transfer from low chair or stool wihtout UE support independently   Baseline Patient needs min asssit to transfer from a low stool   Period Weeks   Status Achieved     PT LONG TERM GOAL  #10   TITLE Patient will be able to transfer from the floor to standing independently with use of LRAD   Baseline Patient needs min assist to transfer from the floor to standing   Time 12   Period Weeks   Status On-going               Plan - 03/18/17 1100    Clinical Impression Statement Pt demonstrates improvement in standing balance today with ability to perform semi tandem balance with intermittent UE support. Although patient's balance is improving, she continues to demonstrate LE weakness with difficulty in performing step ups and patient will benefit from further skilled therapy focused on improving LE strength and balance to improve ADL performance and decrease fall risk.     Rehab Potential Good   Clinical Impairments Affecting Rehab Potential weakness and decreased standing balance   PT Frequency 1x / week   PT Duration 12 weeks   PT Treatment/Interventions Therapeutic exercise;Therapeutic activities;Gait training;Balance training;Stair training;DME Instruction;Neuromuscular re-education;Patient/family education   PT Next Visit Plan Bil knee flexion/extension strengthening; Progress LE, core strengthening and balance   PT Home Exercise Plan Continued from previous sessions.    Consulted and Agree with Plan of Care Patient      Patient will benefit from skilled therapeutic intervention in order to improve the following deficits and impairments:  Abnormal gait, Decreased balance, Decreased endurance, Difficulty  walking, Decreased strength  Visit Diagnosis: Muscle weakness (generalized)  Other lack of coordination  Difficulty in walking, not elsewhere classified     Problem List There are no active problems to display for this patient.   Myrene Galas, PT DPT 03/18/2017, 11:04 AM  [place] [facility] MAIN [facility] SERVICES [address] [place], [place], [address] Phone: [phone]   Fax:  [phone]  Name:  Michelle Randall MRN: [id number] Date of Birth: June 07, 1996

## 2017-03-19 NOTE — Therapy (Signed)
[place] MA[address] [facility] SERVICES [address] [place], [place], [address] Phone: [phone]   Fax:  [phone]  Occupational Therapy Treatment/Reassessment  Patient Details  Name: Michelle Randall MRN: [id number] Date of Birth: 1996-09-18 No Data Recorded  Encounter Date: 03/18/2017      OT End of Session - 03/18/17 1010    Visit Number 42   Number of Visits 94   Date for OT Re-Evaluation 06/10/17   Authorization Type medicaid visit 27 of 53   OT Start Time [phone]   OT Stop Time 1000   OT Time Calculation (min) 44 min   Activity Tolerance Patient tolerated treatment well   Behavior During Therapy [facility] for tasks assessed/performed      History reviewed. No pertinent past medical history.  History reviewed. No pertinent surgical history.  There were no vitals filed for this visit.      Subjective Assessment - 03/18/17 0921    Subjective  Patient reports this week is the last week of class and next week is exam week.  She is ready for a break in the summer.     Patient Stated Goals To be as independent as possible at home and at school.    Currently in Pain? No/denies   Pain Score 0-No pain   Multiple Pain Sites No                      OT Treatments/Exercises (OP) - 03/18/17 1023      Fine Motor Coordination   Other Fine Motor Exercises Patient seen for manipulation of [place] like discs with unilateral right and left hands for turning and flipping with cues to work on speed and dexterity, advanced to bilateral hand use simultaneously in rhythm.  Manipulation of 1/2 inch washers and placing and removing from small dowel sticks in multi directional planes of motion.  Manipulation of coins (pennies, dimes, quarters and nickels) from flat tabletop surface and working towards translatory skills of the right hand.       Neurological Re-education Exercises   Other Exercises 1 Patient seen this date for BUE strengthening  exercises to impact self care and school tasks.  3# dowel exercises for shoulder flexion, chest press, ABD/ADD, forwards/backwards circles for 2 sets of 15 repetitions each.  Resistive pinch pins of all levels for lateral and 3 point pinch placing onto elevated surface to encourage reach and range of motion.  Cues for technique and hand positioning during exercises.                  OT Education - 03/18/17 [phone]    Education provided Yes   Education Details fine motor coordination, strengthening exercises   Person(s) Educated Patient   Methods Explanation;Demonstration;Verbal cues   Comprehension Verbal cues required;Returned demonstration;Verbalized understanding             OT Long Term Goals - 03/18/17 2376      OT LONG TERM GOAL #1   Title Patient will demonstrate methods of picking up her bookbag and placing it onto the back of her chair with modified independence to transport items to class.   Baseline requires assistance from others to complete now but will need to be independent when at college in the fall .   Time 6   Period Months   Status Achieved     OT LONG TERM GOAL #2   Title Patient will be able to make her bed with modified  independence.   Baseline she continues to have difficulty with balance and completing this task, increased time.   Time 6   Period Months   Status On-going     OT LONG TERM GOAL #3   Title Patient will demonstrate the ability to perform vacuuming of one room with modified independence using light weight vacuum.   Baseline still requires max assist   Time 6   Period Months   Status On-going     OT LONG TERM GOAL #4   Title Patient will demonstrate the ability to open containers with sealed tops such as on yogurt containers and individual ice cream containers.    Baseline increased difficulty with sealed, peel away container tops and requires assistance or modifications to open at times   Time 12   Period Weeks   Status On-going      OT LONG TERM GOAL #5   Title Patient will demonstrate the ability to perform small buttons on shirts independently and with good speed in order to dress for school.   Baseline getting easier but if rushed she often does the wrong button holes and will have to have mom to redo.    Time 12   Period Weeks   Status Partially Met     OT LONG TERM GOAL #6   Title Patient will donn and doff small earrings with modified independence.   Baseline mod assist from mom, patient has difficulty holding and dropping with attempts, longer earring are easier.   Time 12   Period Weeks   Status Partially Met     OT LONG TERM GOAL #7   Title Patient will be able to zip her rainboots while they are on her feet with modified independence.     Baseline can operate zipper when not on feet but has difficulty when putting boots on to zip them.    Time 12   Period Weeks   Status New     OT LONG TERM GOAL #8   Title Patient to complete am self care demonstrating efficient methods of dressing skills to get to school on time.    Baseline often has to have her mom help her to get dressed to get to school on time.   Time 12   Period Weeks   Status Partially Met     OT LONG TERM GOAL  #9   Baseline Patient will be able to braid her own hair before college   Time 6   Period Months   Status Achieved     OT LONG TERM GOAL  #10   Time 6     OT LONG TERM GOAL  #11   TITLE Will be able to squeeze tooth paste out of the tube when it is less than half full.   Baseline difficulty with small travel sized.   Time 6   Period Months   Status On-going     OT LONG TERM GOAL  #12   TITLE Patient will demonstrate managing wheelchair rain poncho in inclement weather with modified independence.   Baseline crutch tips are difficult for anyone to take them off, goal deferred.   Time 6   Period Months   Status Achieved     OT LONG TERM GOAL  #13   TITLE Patient will be able to complete 2 1/2 minutes straight with  left shoulder flexion simulating driving with a stearing wheel ball to be able to drive to college.    Baseline Patient has  not been driving, her family takes her to school and drops her off.    Time 6   Period Months   Status Deferred     OT LONG TERM GOAL  #15   TITLE Patient will be able to obtain food tray in the cafeteria from a wheelchair level and take to the table without spilling with modified independence.    Baseline max assist   Time 6   Period Months   Status Achieved               Plan - 03/18/17 1011    Clinical Impression Statement Patient has continued to make good progress with fine motor coordination and strength in BUE to perform school, work and home tasks.  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  Her strength has improved to be able to pick up her loaded bookbag from the floor and place onto the back of her wheelchair.  She has been volunteering more on committees at school for fundraising and recently had to roll coins for coin drive which gave her increased difficulty and would like to work on this skill more.  She continues to benefit from skilled OT to maximize safety and independence in daily tasks.     Rehab Potential Good   OT Frequency 1x / week   OT Duration 12 weeks   OT Treatment/Interventions Self-care/ADL training;Therapeutic exercise;Therapeutic exercises;Therapeutic activities;DME and/or AE instruction;Patient/family education   Consulted and Agree with Plan of Care Patient      Patient will benefit from skilled therapeutic intervention in order to improve the following deficits and impairments:  Impaired UE functional use, Difficulty walking, Decreased strength, Decreased coordination, Decreased endurance, Decreased balance  Visit Diagnosis: Muscle weakness (generalized)  Other lack of coordination    Problem  List There are no active problems to display for this patient.  Achilles Dunk, OTR/L, CLT  Lovett,Amy 03/19/2017, 10:45 AM  [place] MAIN [facility] SERVICES [place], [place], [address] Phone: [phone]   Fax:  [phone]  Name: WILLY VORCE MRN: [id number] Date of Birth: [birth year]

## 2017-03-23 ENCOUNTER — Ambulatory Visit: Payer: Medicaid Other | Admitting: Occupational Therapy

## 2017-03-23 ENCOUNTER — Ambulatory Visit: Payer: Medicaid Other | Admitting: Physical Therapy

## 2017-03-23 ENCOUNTER — Encounter: Payer: Self-pay | Admitting: Physical Therapy

## 2017-03-23 DIAGNOSIS — M6281 Muscle weakness (generalized): Secondary | ICD-10-CM | POA: Diagnosis not present

## 2017-03-23 DIAGNOSIS — R262 Difficulty in walking, not elsewhere classified: Secondary | ICD-10-CM

## 2017-03-23 DIAGNOSIS — R278 Other lack of coordination: Secondary | ICD-10-CM

## 2017-03-23 NOTE — Therapy (Signed)
[place] [facility] MAIN [facility] SERVICES [address] [place], [place], [address] Phone: [phone]   Fax:  [phone]  Physical Therapy Treatment  Patient Details  Name: Michelle Randall MRN: [id number] Date of Birth: 07-21-1996 No Data Recorded  Encounter Date: 03/23/2017      PT End of Session - 03/23/17 0944    Visit Number 82   Number of Visits 45   Date for PT Re-Evaluation 04/27/17   Authorization Type medicaid   PT Start Time [phone]   PT Stop Time 1031   PT Time Calculation (min) 44 min   Equipment Utilized During Treatment Gait belt   Activity Tolerance Patient tolerated treatment well;Patient limited by fatigue   Behavior During Therapy [facility] for tasks assessed/performed      History reviewed. No pertinent past medical history.  History reviewed. No pertinent surgical history.  There were no vitals filed for this visit.      Subjective Assessment - 03/23/17 0948    Subjective Pt reports no new changes or concerns at this time.  She reports she has been doing her HEP.   Patient is accompained by: Family member   Limitations Walking   Patient Stated Goals Patient wants to improve her core strength.    Currently in Pain? No/denies   Multiple Pain Sites No        Therapeutic Exercise:   Penguin crunches in hooklying - 2 x 20  Bridges with PT holding feet - x 20. Cues to raise hips higher for greater glute activation  Standing abdominal twists with GTB 2x20 each direction with cues for speed during concentric phase  Rhomberg stance on airex with ball toss to bucket 10 ft away x3 minutes  Rhomberg stance on airex with ball toss to L and then to R. x20 each direction  Taking step forward with R foot and raising LUE overhead with 4# dumbbell x10 and repeated with L foot and RUE x10.  Agility ladder with cues for speed two feet each square moving laterally x4 lengths in // bars               PT Education - 03/23/17 0943     Education provided Yes   Education Details Exercise technique; clinical reasoning behind interventions   Person(s) Educated Patient   Methods Explanation;Demonstration;Verbal cues   Comprehension Verbalized understanding;Returned demonstration;Verbal cues required;Need further instruction             PT Long Term Goals - 01/28/17 1104      PT LONG TERM GOAL #1   Title Patient will improve Dynamic Gait Index (DGI) score to > 19/24 for low falls risk regarding dynamic walking tasks    Baseline 14/24; 11/02/16: 15/24 01/28/17: 18/24   Time 12   Period Weeks   Status On-going     PT LONG TERM GOAL #2   Title Patient will increase Berg Balance score by > 6 points to demonstrate decreased fall risk during functional activities    Baseline 11/02/16: 34/56 01/28/17: 39/56   Time 12   Period Weeks   Status On-going     PT LONG TERM GOAL #3   Title Patient will be able to transfer in and out of a large car with Patient will be able to transfer in and out of a high seated car with CGA    Baseline Patient requires min for transfer into large car   Time 12   Period Weeks   Status On-going  PT LONG TERM GOAL #4   Title Patient will complete a TUG test in < 12 seconds for independent mobility and decreased fall risk    Baseline 11.45   Time 12   Period Weeks   Status Achieved     PT LONG TERM GOAL #5   Title Patient will improve 6 minute walk distance by > 150 ft for improved return to functional community activities    Baseline 740 01/28/2017: 771ft   Time 12   Status On-going     PT LONG TERM GOAL #6   Title Patient will improve gait speed to > 1.2 m/s with least restrictive assistive device to return to normal walking speed    Baseline .76 m/h 01/28/2017: .87   Time 12   Period Weeks   Status On-going     PT LONG TERM GOAL #7   Title Patient (< 7 years old) will complete five times sit to stand test in < 10 seconds indicating an increased LE strength and improved  balance    Baseline 9.45 sec   Time 12   Period Weeks   Status Achieved     PT LONG TERM GOAL #8   Title Patient will be able to ambulate on inclines and grass independenlty with LRAD   Baseline Can ambulate over smooth grass but unable to safely amb over heavy uneven surfaces   Time 12   Period Weeks   Status On-going     PT LONG TERM GOAL  #9   TITLE Patient will be abe to transfer from low chair or stool wihtout UE support independently   Baseline Patient needs min asssit to transfer from a low stool   Period Weeks   Status Achieved     PT LONG TERM GOAL  #10   TITLE Patient will be able to transfer from the floor to standing independently with use of LRAD   Baseline Patient needs min assist to transfer from the floor to standing   Time 12   Period Weeks   Status On-going               Plan - 03/23/17 0957    Clinical Impression Statement Pt responded well to balance activities but does demonstrate instability with challenges to her balance while on uneven surfaces.  She requires cues for proper form and technique when performing strengthening exercises.  She will benefit from continued skilled PT interventions for improved strength, balance, and QOL.   Rehab Potential Good   Clinical Impairments Affecting Rehab Potential weakness and decreased standing balance   PT Frequency 1x / week   PT Duration 12 weeks   PT Treatment/Interventions Therapeutic exercise;Therapeutic activities;Gait training;Balance training;Stair training;DME Instruction;Neuromuscular re-education;Patient/family education   PT Next Visit Plan Bil knee flexion/extension strengthening; Progress LE, core strengthening and balance   PT Home Exercise Plan Continued from previous sessions.    Consulted and Agree with Plan of Care Patient      Patient will benefit from skilled therapeutic intervention in order to improve the following deficits and impairments:  Abnormal gait, Decreased balance,  Decreased endurance, Difficulty walking, Decreased strength  Visit Diagnosis: Muscle weakness (generalized)  Other lack of coordination  Difficulty in walking, not elsewhere classified     Problem List There are no active problems to display for this patient.   Encarnacion Chu PT, DPT 03/23/2017, 10:44 AM  [place] [facility] MAIN [facility] SERVICES [address] [place], [place], [address] Phone: [phone]  Fax:  [phone]  Name: Michelle Randall MRN: [id number] Date of Birth: 10/08/1996

## 2017-03-25 ENCOUNTER — Encounter: Payer: Medicaid Other | Admitting: Occupational Therapy

## 2017-03-25 ENCOUNTER — Ambulatory Visit: Payer: Medicaid Other

## 2017-03-27 ENCOUNTER — Encounter: Payer: Self-pay | Admitting: Occupational Therapy

## 2017-03-27 NOTE — Therapy (Addendum)
[place] MAIN [facility] SERVICES [address] [place], [place], [address] Phone: [phone]   Fax:  [phone]  Occupational Therapy Treatment/Reassessment  Patient Details  Name: Michelle Randall MRN: [id number] Date of Birth: 1995-12-09 No Data Recorded  Encounter Date: 03/23/2017      OT End of Session - 03/27/17 2118    Visit Number 39   Number of Visits 94   Date for OT Re-Evaluation 06/10/17   Authorization Type medicaid visit 68 of 75   OT Start Time 0907   OT Stop Time 0946   OT Time Calculation (min) 39 min   Activity Tolerance Patient tolerated treatment well   Behavior During Therapy [facility] for tasks assessed/performed      History reviewed. No pertinent past medical history.  History reviewed. No pertinent surgical history.  There were no vitals filed for this visit.   Patient was seen this date for reassessment of skills as follows:   Right hand grip strength 50#, left hand 45#   Lateral pinch right 13#, left 11# 3 point pinch right 15#, left 13#. 9 hole peg test right 22#, left 24#. Discussed POC and goals for upcoming recertification period regarding home, work and school demands.  Patient's personal goal is to still work towards independent living and would like to transition to campus life in the future with her own dorm room.    Patient seen for fine motor coordination tasks this date with emphasis on speed and dexterity skills with cues for prehension patterns and techniques.  Variety of media used this date including coins, beads, nuts, bolts and other 1/2 inch sized objects of varying shapes and sizes.   Strengthening with use of 1# wrist weight for multidirectional reaching for 4 levels with cues for weight shifting and reaching patterns.        OT Education - 03/30/17 1514    Education provided Yes   Education Details POC, goals.  Home exercises for UB ROM, strengthening with resistive bands and fine motor  coordination skills with use of coins.    Person(s) Educated Patient   Methods Explanation;Demonstration;Verbal cues   Comprehension Verbal cues required;Returned demonstration;Verbalized understanding             OT Long Term Goals - 03/27/17 2121      OT LONG TERM GOAL #1   Title Patient will demonstrate methods of picking up her bookbag and placing it onto the back of her chair with modified independence to transport items to class.   Baseline --   Time 6   Period Months   Status Achieved     OT LONG TERM GOAL #2   Title Patient will be able to make her bed with modified independence.   Baseline she continues to have difficulty with balance and completing this task, increased time.   Time 6   Period Months   Status On-going     OT LONG TERM GOAL #3   Title Patient will demonstrate the ability to perform vacuuming of one room with modified independence using light weight vacuum.   Baseline still requires max assist   Time 6   Period Months   Status On-going     OT LONG TERM GOAL #4   Title Patient will demonstrate the ability to open containers with sealed tops such as on yogurt containers and individual ice cream containers.    Baseline continues to have increased difficulty with sealed, peel away container tops and requires assistance  or modifications to open at times   Time 12   Period Weeks   Status On-going     OT LONG TERM GOAL #5   Title Patient will demonstrate the ability to perform small buttons on shirts independently and with good speed in order to dress for school.   Baseline getting easier but if rushed she often does the wrong button holes and will have to have mom to redo.    Time 12   Period Weeks   Status Partially Met     OT LONG TERM GOAL #6   Title Patient will donn and doff small earrings with modified independence.   Baseline mod assist from mom, patient has difficulty holding and dropping with attempts, longer earring are easier.   Difficulty with small stud earrings.    Time 12   Period Weeks   Status Partially Met     OT LONG TERM GOAL #7   Title Patient will be able to zip her rainboots while they are on her feet with modified independence.     Baseline can operate zipper when not on feet but has difficulty when putting boots on to zip them, continues to require moderate assistance   Time 12   Period Weeks   Status On-going     OT LONG TERM GOAL #8   Title Patient to complete am self care demonstrating efficient methods of dressing skills to get to school on time.    Baseline often has to have her mom help her to get dressed to get to school on time, has been late 1 time in the last week due to slowness in getting ready   Time 12   Period Weeks   Status Partially Met     OT LONG TERM GOAL  #9   Baseline Patient will be able to braid her own hair before college   Time 6   Period Months   Status Achieved     OT LONG TERM GOAL  #10   Time 6     OT LONG TERM GOAL  #11   TITLE Will be able to squeeze tooth paste out of the tube when it is less than half full.   Baseline difficulty with small travel sized, may look into alternative ways since this has been a chronic issue.   Time 6   Period Months   Status On-going     OT LONG TERM GOAL  #12   TITLE Patient will demonstrate managing wheelchair rain poncho in inclement weather with modified independence.   Baseline crutch tips are difficult for anyone to take them off, goal deferred.   Time 6   Period Months   Status Achieved     OT LONG TERM GOAL  #13   TITLE Patient will be able to complete 2 1/2 minutes straight with left shoulder flexion simulating driving with a stearing wheel ball to be able to drive to college.    Baseline Patient has not been driving, her family takes her to school and drops her off.    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    Baseline difficulty coordinating the use of both hands to complete task.  6 months New     OT  LONG TERM GOAL  #15   TITLE Patient will be able to obtain food tray in the cafeteria from a wheelchair level and take to the table without spilling with modified independence.    Baseline max assist   Time 6   Period Months   Status Achieved               Plan - 03/30/17 1515    Clinical Impression Statement Patient has continued to make good progress with fine motor coordination and strength in BUE to perform school, work and home tasks. 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 Her strength has improved to be able to pick up her loaded bookbag from the floor and place onto the back of her wheelchair, she continues to have difficulty with making the bed, having enough balance to perform in standing as well as strength to lift heavier comforter and difficulty with fitted sheets.  She continues to have difficulty with managing the cord of the vacuum in standing and will need additional instruction and possibly modifications to this area.  She is able to open more containers than in the past but still has the most difficulty with yogurt containers with peel away lids to pinch and pull. She has been late once this week due to being slow with completion of basic self care tasks and buttons.  She has progressed to being able to don larger earrings however she has the most difficulty with small stud earrings with small backs and requires moderate assistance.  She continues to require assistance with donning and doffing of rainboots and is unable to lift legs to reach to zipper.  She is able to tie her shoes but has difficulty with shorter shoelaces on select shoes at home and requires min to moderate  assist and does not like to ask friends or other students to assist her with this task.  She has been volunteering more on committees at school for fundraising and recently had to roll coins for coin drive which gave her increased difficulty and would like to work on this skill more. When she takes her exams, she is required to peel off a seal to place onto her testing booklet and occasionally requires assistance with this task.  If she is unable to place the seal correctly, the test will be marked as invalid.  She has a home program for strengthening, fine motor coordination and gross motor coordination tasks which we are constantly adding to.  She continues to benefit from skilled OT to maximize safety and independence in daily tasks at school, work and home and would benefit from continuing to work on the areas above to impact her performance in being more independent with the anticipated transition to independent living in the next 1-2 years.    Rehab Potential Good   OT Frequency 1x / week   OT Duration 12 weeks   OT Treatment/Interventions Self-care/ADL training;Therapeutic exercise;Therapeutic exercises;Therapeutic activities;DME and/or AE instruction;Patient/family education   Consulted and Agree with Plan of Care Patient                                   Patient will benefit from skilled therapeutic intervention in order to improve the following deficits and impairments:  Impaired UE functional use, Difficulty walking, Decreased strength, Decreased coordination, Decreased endurance,  Decreased balance  Visit Diagnosis: Muscle weakness (generalized)  Other lack of coordination    Problem List There are no active problems to display for this patient.  Achilles Dunk, OTR/L, CLT Lovett,Amy 03/27/2017, 9:22 PM  [place] MAIN [facility] SERVICES [address] [place], [place], [address] Phone: [phone]   Fax:   [phone]  Name: INEZE SERRAO MRN: [id number] Date of Birth: [birth year]

## 2017-03-28 ENCOUNTER — Encounter: Payer: Self-pay | Admitting: Physical Therapy

## 2017-03-28 ENCOUNTER — Ambulatory Visit: Payer: Medicaid Other | Admitting: Physical Therapy

## 2017-03-28 DIAGNOSIS — R262 Difficulty in walking, not elsewhere classified: Secondary | ICD-10-CM

## 2017-03-28 DIAGNOSIS — M6281 Muscle weakness (generalized): Secondary | ICD-10-CM | POA: Diagnosis not present

## 2017-03-28 DIAGNOSIS — R278 Other lack of coordination: Secondary | ICD-10-CM

## 2017-03-28 NOTE — Therapy (Addendum)
[place] [facility] MAIN [facility] SERVICES [address] [place], [place], [address] Phone: [phone]   Fax:  [phone]  Physical Therapy Treatment  Patient Details  Name: Michelle Randall MRN: [id number] Date of Birth: May 19, 1996 No Data Recorded  Encounter Date: 03/28/2017      PT End of Session - 03/28/17 1609    Visit Number 83   Number of Visits 45   Date for PT Re-Evaluation 04/27/17   Authorization Type medicaid   PT Start Time 0335   PT Stop Time 0415   PT Time Calculation (min) 40 min   Equipment Utilized During Treatment Gait belt   Activity Tolerance Patient tolerated treatment well;Patient limited by fatigue   Behavior During Therapy [facility] for tasks assessed/performed      History reviewed. No pertinent past medical history.  History reviewed. No pertinent surgical history.  There were no vitals filed for this visit.      Subjective Assessment - 03/29/17 0906    Subjective Pt reports no new changes or concerns at this time.  She reports she has been doing her HEP.   Patient is accompained by: Family member   Limitations Walking   Patient Stated Goals Patient wants to improve her core strength.    Currently in Pain? No/denies   Pain Score 0-No pain   Multiple Pain Sites No      TREATMENT  Therapeutic Exercise Leg press 100 lbs x 20 x 3 Step ups to 8" step  - x15 B LE Eccentric step downs with 4 inch step x 20 Left and right step ups x 15 LLE and RLE Side stepping up 8" in  bars - x 20 LLE and RLE Fwd stepping and bwds stepping in parallel bars x 10 lengths of the bars  Patient needs min assist and CGA and cues for posture correction. Patient has loss of balance with stepping in parallel bars without UE support.                            PT Education - 03/28/17 1608    Education provided Yes   Education Details posture with exericses   Person(s) Educated Patient   Methods Explanation    Comprehension Verbalized understanding             PT Long Term Goals - 03/28/17 1104      PT LONG TERM GOAL #1   Title Patient will improve Dynamic Gait Index (DGI) score to > 19/24 for low falls risk regarding dynamic walking tasks    Baseline 14/24; 11/02/16: 15/24 03/28/17: 18/24   Time 12   Period Weeks   Status On-going     PT LONG TERM GOAL #2   Title Patient will increase Berg Balance score by > 6 points to demonstrate decreased fall risk during functional activities    Baseline 11/02/16: 34/56 03/28/17: 39/56   Time 12   Period Weeks   Status On-going     PT LONG TERM GOAL #3   Title Patient will be able to transfer in and out of a large car with Patient will be able to transfer in and out of a high seated car with CGA    Baseline Patient requires min for transfer into large car   Time 12   Period Weeks   Status On-going     PT LONG TERM GOAL #4   Title Patient will complete a TUG test  in < 12 seconds for independent mobility and decreased fall risk    Baseline 11.92 sec   Time 12   Period Weeks   Status Achieved     PT LONG TERM GOAL #5   Title Patient will improve 6 minute walk distance by > 150 ft for improved return to functional community activities    Baseline 740 01/28/2017: 800 ft   Time 12   Status On-going     PT LONG TERM GOAL #6   Title Patient will improve gait speed to > 1.2 m/s with least restrictive assistive device to return to normal walking speed    Baseline .76 m/h 03/28/2017: Marland Kitchen.Marland Kitchen.85 m/ sec   Time 12   Period Weeks   Status On-going     PT LONG TERM GOAL #7   Title Patient (< 20 years old) will complete five times sit to stand test in < 10 seconds indicating an increased LE strength and improved balance    Baseline 9.45 sec   Time 12   Period Weeks   Status Achieved     PT LONG TERM GOAL #8   Title Patient will be able to ambulate on inclines and grass independenlty with LRAD   Baseline Can ambulate over smooth grass but unable to  safely amb over heavy uneven surfaces   Time 12   Period Weeks   Status On-going     PT LONG TERM GOAL  #9   TITLE Patient will be abe to transfer from low chair or stool wihtout UE support independently   Baseline Patient needs min asssit to transfer from a low stool   Period Weeks   Status Achieved     PT LONG TERM GOAL  #10   TITLE Patient will be able to transfer from the floor to standing independently with use of LRAD   Baseline Patient needs min assist to transfer from the floor to standing   Time 12   Period Weeks   Status On-going               Plan - 03/28/17 1610    Clinical Impression Statement Pt tolerated all exercises well this session. She demonstrates fatigue LE strengthening exercises. She performs balance exercises with instability at times, requiring // bars for support.She performed her outcome measures to address goals and they remain ongoing. She will continue to benefit from continued skilled PT interventions for improved strength, endurance, balance. .   Rehab Potential Good   Clinical Impairments Affecting Rehab Potential weakness and decreased standing balance   PT Frequency 1x / week   PT Duration 12 weeks   PT Treatment/Interventions Therapeutic exercise;Therapeutic activities;Gait training;Balance training;Stair training;DME Instruction;Neuromuscular re-education;Patient/family education   PT Next Visit Plan Bil knee flexion/extension strengthening; Progress LE, core strengthening and balance   PT Home Exercise Plan Continued from previous sessions.    Consulted and Agree with Plan of Care Patient      Patient will benefit from skilled therapeutic intervention in order to improve the following deficits and impairments:  Abnormal gait, Decreased balance, Decreased endurance, Difficulty walking, Decreased strength  Visit Diagnosis: Muscle weakness (generalized)  Other lack of coordination  Difficulty in walking, not elsewhere  classified     Problem List There are no active problems to display for this patient.  Ezekiel InaKristine S Emmalyne Giacomo, PT, DPT [place], Barkley BrunsKristine S 03/29/2017, 9:07 AM  [place] [facility] [address] [place], [place], [address] Phone: [phone]  Fax:  [phone]  Name: Michelle Randall MRN: [id number] Date of Birth: 10/08/1996

## 2017-03-29 ENCOUNTER — Ambulatory Visit: Payer: Medicaid Other | Admitting: Occupational Therapy

## 2017-03-29 DIAGNOSIS — R278 Other lack of coordination: Secondary | ICD-10-CM

## 2017-03-29 DIAGNOSIS — M6281 Muscle weakness (generalized): Secondary | ICD-10-CM | POA: Diagnosis not present

## 2017-04-01 ENCOUNTER — Encounter: Payer: Medicaid Other | Admitting: Occupational Therapy

## 2017-04-01 ENCOUNTER — Ambulatory Visit: Payer: Medicaid Other

## 2017-04-05 ENCOUNTER — Ambulatory Visit: Payer: Medicaid Other

## 2017-04-05 ENCOUNTER — Encounter: Payer: Self-pay | Admitting: Occupational Therapy

## 2017-04-05 ENCOUNTER — Ambulatory Visit: Payer: Medicaid Other | Admitting: Occupational Therapy

## 2017-04-05 DIAGNOSIS — M6281 Muscle weakness (generalized): Secondary | ICD-10-CM | POA: Diagnosis not present

## 2017-04-05 DIAGNOSIS — R262 Difficulty in walking, not elsewhere classified: Secondary | ICD-10-CM

## 2017-04-05 DIAGNOSIS — R278 Other lack of coordination: Secondary | ICD-10-CM

## 2017-04-05 NOTE — Therapy (Signed)
[facility] [address] [place], [place], [address] Phone: [phone]   Fax:  [phone]  Physical Therapy Treatment  Patient Details  Name: Michelle Randall MRN: [id number] Date of Birth: 1996-02-14 No Data Recorded  Encounter Date: 04/05/2017      PT End of Session - 04/05/17 1508    Visit Number 84   Number of Visits 45   Date for PT Re-Evaluation 04/27/17   Authorization Type medicaid   PT Start Time 1430   PT Stop Time 1515   PT Time Calculation (min) 45 min   Equipment Utilized During Treatment Gait belt   Activity Tolerance Patient tolerated treatment well;Patient limited by fatigue   Behavior [facility] for tasks assessed/performed      History reviewed. No pertinent past medical history.  History reviewed. No pertinent surgical history.  There were no vitals filed for this visit.      Subjective Assessment - 04/05/17 1507    Subjective Patient reports she continues to have difficulties with performing curbs and walking long distances.    Patient is accompained by: Family member   Limitations Walking   Patient Stated Goals Patient wants to improve her core strength.    Currently in Pain? No/denies        TREATMENT  Therapeutic Exercise Penguin crunches in hooklying - 2 min Reverse crunch with weighted bar in supine with 2# bar - 2 x 20 Cross body dead bug with 2# dumbbell - x14 Bridges with feet on top of physioball - x 20  Step-ups 6in with airex pad - x10 B feet Obstacle course with step over half foam(s) and over 8in step - x3 Standing balance on airex pad with lifting hands up for  bars for support - 2 x 40sec Side stepping over half foam  with B UE support - x 15 Patient demonstrates increased fatigue at the end of session         PT Education - 04/05/17 1508    Education provided Yes   Education Details Form and technique with balance   Person(s) Educated Patient    Methods Explanation;Demonstration   Comprehension Verbalized understanding;Returned demonstration             PT Long Term Goals - 01/28/17 1104      PT LONG TERM GOAL #1   Title Patient will improve Dynamic Gait Index (DGI) score to > 19/24 for low falls risk regarding dynamic walking tasks    Baseline 14/24; 11/02/16: 15/24 01/28/17: 18/24   Time 12   Period Weeks   Status On-going     PT LONG TERM GOAL #2   Title Patient will increase Berg Balance score by > 6 points to demonstrate decreased fall risk during functional activities    Baseline 11/02/16: 34/56 01/28/17: 39/56   Time 12   Period Weeks   Status On-going     PT LONG TERM GOAL #3   Title Patient will be able to transfer in and out of a large car with Patient will be able to transfer in and out of a high seated car with CGA    Baseline Patient requires min for transfer into large car   Time 12   Period Weeks   Status On-going     PT LONG TERM GOAL #4   Title Patient will complete a TUG test in < 12 seconds for independent mobility and decreased fall risk    Baseline 11.45  Time 12   Period Weeks   Status Achieved     PT LONG TERM GOAL #5   Title Patient will improve 6 minute walk distance by > 150 ft for improved return to functional community activities    Baseline 740 01/28/2017: 724ft   Time 12   Status On-going     PT LONG TERM GOAL #6   Title Patient will improve gait speed to > 1.2 m/s with least restrictive assistive device to return to normal walking speed    Baseline .76 m/h 01/28/2017: .87   Time 12   Period Weeks   Status On-going     PT LONG TERM GOAL #7   Title Patient (< 51 years old) will complete five times sit to stand test in < 10 seconds indicating an increased LE strength and improved balance    Baseline 9.45 sec   Time 12   Period Weeks   Status Achieved     PT LONG TERM GOAL #8   Title Patient will be able to ambulate on inclines and grass independenlty with LRAD   Baseline  Can ambulate over smooth grass but unable to safely amb over heavy uneven surfaces   Time 12   Period Weeks   Status On-going     PT LONG TERM GOAL  #9   TITLE Patient will be abe to transfer from low chair or stool wihtout UE support independently   Baseline Patient needs min asssit to transfer from a low stool   Period Weeks   Status Achieved     PT LONG TERM GOAL  #10   TITLE Patient will be able to transfer from the floor to standing independently with use of LRAD   Baseline Patient needs min assist to transfer from the floor to standing   Time 12   Period Weeks   Status On-going               Plan - 04/05/17 1509    Clinical Impression Statement Pt demonstrates decreased L LE strength wih performing step ups onto 8" step incidated by increased difficulty with performance. Patient demonstrates decreased balance requiring intermittent UE support to perform balance activites. Patient will benefit from further skilled therapy focussed on improving strength to decrease fall risk.    Rehab Potential Good   Clinical Impairments Affecting Rehab Potential weakness and decreased standing balance   PT Frequency 1x / week   PT Duration 12 weeks   PT Treatment/Interventions Therapeutic exercise;Therapeutic activities;Gait training;Balance training;Stair training;DME Instruction;Neuromuscular re-education;Patient/family education   PT Next Visit Plan Bil knee flexion/extension strengthening; Progress LE, core strengthening and balance   PT Home Exercise Plan Continued from previous sessions.    Consulted and Agree with Plan of Care Patient      Patient will benefit from skilled therapeutic intervention in order to improve the following deficits and impairments:  Abnormal gait, Decreased balance, Decreased endurance, Difficulty walking, Decreased strength  Visit Diagnosis: Muscle weakness (generalized)  Other lack of coordination  Difficulty in walking, not elsewhere  classified     Problem List There are no active problems to display for this patient.   Myrene Galas, PT DPT 04/05/2017, 3:16 PM  [place] [facility] MAIN [facility] SERVICES [address] [place], [place], [address] Phone: [phone]   Fax:  [phone]  Name: Michelle Randall MRN: [id number] Date of Birth: 09/01/1996

## 2017-04-05 NOTE — Therapy (Signed)
[place] MAIN [facility] SERVICES [address] [place], [place], [address] Phone: [phone]   Fax:  [phone]  Occupational Therapy Treatment  Patient Details  Name: Michelle Randall MRN: [id number] Date of Birth: 1996-02-09 No Data Recorded  Encounter Date: 03/29/2017      OT End of Session - 04/05/17 1437    Visit Number 67   Number of Visits 94   Date for OT Re-Evaluation 06/10/17   Authorization Type medicaid visit 7 of 58   OT Start Time 1105   OT Stop Time 1146   OT Time Calculation (min) 41 min   Activity Tolerance Patient tolerated treatment well   Behavior During Therapy WFL for tasks assessed/performed      History reviewed. No pertinent past medical history.  History reviewed. No pertinent surgical history.  There were no vitals filed for this visit.      Subjective Assessment - 04/05/17 1432    Subjective  Patient reports she has a lot of projects to do instead of exams next week.    Patient Stated Goals To be as independent as possible at home and at school.    Currently in Pain? No/denies   Pain Score 0-No pain                      OT Treatments/Exercises (OP) - 04/05/17 1433      Fine Motor Coordination   Other Fine Motor Exercises Patient seen for manipulation of [place] like discs with unilateral right and left hands for turning and flipping with cues to work on speed and dexterity, advanced to bilateral hand use simultaneously in rhythm.  Manipulation of 1/2 inch washers and placing and removing from small dowel sticks in multi directional planes of motion.  Manipulation of coins (pennies, dimes, quarters and nickels) from flat tabletop surface and working towards translatory skills of the right hand.       Neurological Re-education Exercises   Other Exercises 1 Patient seen for reaching tasks in standing with upper extremity ranger in standing for flexion, ABD, ADD, arm circles for 2 sets of  10 reps each, Cues for directionality and technique.  Patient seen for overhead reaching tasks combined with resistive pinch skills for all levels from yellow to black and placing onto elevated surfaces in multi directions.                  OT Education - 04/05/17 1437    Education provided Yes   Education Details upper extremity ranger, review of goals and poc   Person(s) Educated Patient   Methods Explanation;Demonstration;Verbal cues   Comprehension Verbal cues required;Returned demonstration;Verbalized understanding             OT Long Term Goals - 03/27/17 2121      OT LONG TERM GOAL #1   Title Patient will demonstrate methods of picking up her bookbag and placing it onto the back of her chair with modified independence to transport items to class.   Baseline --   Time 6   Period Months   Status Achieved     OT LONG TERM GOAL #2   Title Patient will be able to make her bed with modified independence.   Baseline she continues to have difficulty with balance and completing this task, increased time.   Time 6   Period Months   Status On-going     OT LONG TERM GOAL #3   Title Patient will  demonstrate the ability to perform vacuuming of one room with modified independence using light weight vacuum.   Baseline still requires max assist   Time 6   Period Months   Status On-going     OT LONG TERM GOAL #4   Title Patient will demonstrate the ability to open containers with sealed tops such as on yogurt containers and individual ice cream containers.    Baseline continues to have increased difficulty with sealed, peel away container tops and requires assistance or modifications to open at times   Time 12   Period Weeks   Status On-going     OT LONG TERM GOAL #5   Title Patient will demonstrate the ability to perform small buttons on shirts independently and with good speed in order to dress for school.   Baseline getting easier but if rushed she often does the  wrong button holes and will have to have mom to redo.    Time 12   Period Weeks   Status Partially Met     OT LONG TERM GOAL #6   Title Patient will donn and doff small earrings with modified independence.   Baseline mod assist from mom, patient has difficulty holding and dropping with attempts, longer earring are easier.  Difficulty with small stud earrings.    Time 12   Period Weeks   Status Partially Met     OT LONG TERM GOAL #7   Title Patient will be able to zip her rainboots while they are on her feet with modified independence.     Baseline can operate zipper when not on feet but has difficulty when putting boots on to zip them, continues to require moderate assistance   Time 12   Period Weeks   Status On-going     OT LONG TERM GOAL #8   Title Patient to complete am self care demonstrating efficient methods of dressing skills to get to school on time.    Baseline often has to have her mom help her to get dressed to get to school on time, has been late 1 time in the last week due to slowness in getting ready   Time 12   Period Weeks   Status Partially Met     OT LONG TERM GOAL  #9   Baseline Patient will be able to braid her own hair before college   Time 6   Period Months   Status Achieved     OT LONG TERM GOAL  #10   Time 6     OT LONG TERM GOAL  #11   TITLE Will be able to squeeze tooth paste out of the tube when it is less than half full.   Baseline difficulty with small travel sized, may look into alternative ways since this has been a chronic issue.   Time 6   Period Months   Status On-going     OT LONG TERM GOAL  #12   TITLE Patient will demonstrate managing wheelchair rain poncho in inclement weather with modified independence.   Baseline crutch tips are difficult for anyone to take them off, goal deferred.   Time 6   Period Months   Status Achieved     OT LONG TERM GOAL  #13   TITLE Patient will be able to complete 2 1/2 minutes straight with left  shoulder flexion simulating driving with a stearing wheel ball to be able to drive to college.    Baseline Patient has not been  driving, her family takes her to school and drops her off.    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    Baseline difficulty coordinating the use of both hands to complete task.      OT LONG TERM GOAL  #15   TITLE Patient will be able to obtain food tray in the cafeteria from a wheelchair level and take to the table without spilling with modified independence.    Baseline max assist   Time 6   Period Months   Status Achieved               Plan - 04/05/17 1438    Clinical Impression Statement Patient is finishing up with college for the spring semester in the next week.  She continues to work towards improving strength, coordination and speed to complete tasks at home and school.  She continues to require cues for technique, may benefit from more tasks in standing to incorporate balance especially to progress with tasks such as bedmaking.     Rehab Potential Good   OT Frequency 1x / week   OT Duration 12 weeks   OT Treatment/Interventions Self-care/ADL training;Therapeutic exercise;Therapeutic exercises;Therapeutic activities;DME and/or AE instruction;Patient/family education   Consulted and Agree with Plan of Care Patient      Patient will benefit from skilled therapeutic intervention in order to improve the following deficits and impairments:  Impaired UE functional use, Difficulty walking, Decreased strength, Decreased coordination, Decreased endurance, Decreased balance  Visit Diagnosis: Muscle weakness (generalized)  Other lack of coordination    Problem List There are no active problems to display for this patient.  Achilles Dunk, OTR/L,  CLT  Ariany Kesselman 04/05/2017, 2:42 PM  [place] MAIN [facility] SERVICES [place], [place], [address] Phone: [phone]   Fax:  [phone]  Name: Michelle Randall MRN: [id number] Date of Birth: [birth year]

## 2017-04-05 NOTE — Therapy (Signed)
[place] MAIN [facility] SERVICES [address] [place], [place], [address] Phone: [phone]   Fax:  [phone]  Occupational Therapy Treatment  Patient Details  Name: Michelle Randall MRN: [id number] Date of Birth: February 16, 1996 No Data Recorded  Encounter Date: 04/05/2017      OT End of Session - 04/05/17 1626    Visit Number 32   Number of Visits 94   Date for OT Re-Evaluation 06/10/17   Authorization Type medicaid visit 53 of 28   OT Start Time 1530   OT Stop Time 1615   OT Time Calculation (min) 45 min   Activity Tolerance Patient tolerated treatment well   Behavior During Therapy [facility] for tasks assessed/performed      History reviewed. No pertinent past medical history.  History reviewed. No pertinent surgical history.  There were no vitals filed for this visit.      Subjective Assessment - 04/05/17 1605    Subjective  Pt. reports she is preparing to go to [place] for her sister's graduation.   Patient is accompained by: Family member   Currently in Pain? No/denies   Pain Score 0-No pain      OT TREATMENT    Neuro muscular re-education:  Pt. worked on grasping 1/8" extra small beads with his second digit, and thumb. Pt. Placed the beads on an extra small dowel. Pt. Worked on grasping one inch resistive cubes alternating thumb opposition to the tip of the 2nd through 5th digits. Th e was positioned at a vertical angle. Pt. Worked on pressing then back into place while isolating 2nd through 5th digits.  Therapeutic Exercise:  Pt. performed gross gripping with grip strengthener. Pt. worked on sustaining grip while grasping pegs and reaching at various heights. Gripper was placed in the 3rd resistive slot with the white resistive spring. Pt. Worked on pinch strengthening in the left hand for lateral, and 3pt. pinch using yellow, red, green, and blue resistive clips. Pt. worked on placing the clips at various vertical and  horizontal angles. Tactile and verbal cues were required for eliciting the desired movement.                 OT Treatments/Exercises (OP) - 04/05/17 1433      Fine Motor Coordination   Other Fine Motor Exercises Patient seen for manipulation of [place] like discs with unilateral right and left hands for turning and flipping with cues to work on speed and dexterity, advanced to bilateral hand use simultaneously in rhythm.  Manipulation of 1/2 inch washers and placing and removing from small dowel sticks in multi directional planes of motion.  Manipulation of coins (pennies, dimes, quarters and nickels) from flat tabletop surface and working towards translatory skills of the right hand.       Neurological Re-education Exercises   Other Exercises 1 Patient seen for reaching tasks in standing with upper extremity ranger in standing for flexion, ABD, ADD, arm circles for 2 sets of 10 reps each, Cues for directionality and technique.  Patient seen for overhead reaching tasks combined with resistive pinch skills for all levels from yellow to black and placing onto elevated surfaces in multi directions.                  OT Education - 04/05/17 1625    Education provided Yes   Education Details [facility]   Person(s) Educated Patient   Methods Explanation;Demonstration   Comprehension Verbalized understanding;Returned demonstration  OT Long Term Goals - 03/27/17 2121      OT LONG TERM GOAL #1   Title Patient will demonstrate methods of picking up her bookbag and placing it onto the back of her chair with modified independence to transport items to class.   Baseline --   Time 6   Period Months   Status Achieved     OT LONG TERM GOAL #2   Title Patient will be able to make her bed with modified independence.   Baseline she continues to have difficulty with balance and completing this task, increased time.   Time 6   Period Months   Status On-going     OT LONG  TERM GOAL #3   Title Patient will demonstrate the ability to perform vacuuming of one room with modified independence using light weight vacuum.   Baseline still requires max assist   Time 6   Period Months   Status On-going     OT LONG TERM GOAL #4   Title Patient will demonstrate the ability to open containers with sealed tops such as on yogurt containers and individual ice cream containers.    Baseline continues to have increased difficulty with sealed, peel away container tops and requires assistance or modifications to open at times   Time 12   Period Weeks   Status On-going     OT LONG TERM GOAL #5   Title Patient will demonstrate the ability to perform small buttons on shirts independently and with good speed in order to dress for school.   Baseline getting easier but if rushed she often does the wrong button holes and will have to have mom to redo.    Time 12   Period Weeks   Status Partially Met     OT LONG TERM GOAL #6   Title Patient will donn and doff small earrings with modified independence.   Baseline mod assist from mom, patient has difficulty holding and dropping with attempts, longer earring are easier.  Difficulty with small stud earrings.    Time 12   Period Weeks   Status Partially Met     OT LONG TERM GOAL #7   Title Patient will be able to zip her rainboots while they are on her feet with modified independence.     Baseline can operate zipper when not on feet but has difficulty when putting boots on to zip them, continues to require moderate assistance   Time 12   Period Weeks   Status On-going     OT LONG TERM GOAL #8   Title Patient to complete am self care demonstrating efficient methods of dressing skills to get to school on time.    Baseline often has to have her mom help her to get dressed to get to school on time, has been late 1 time in the last week due to slowness in getting ready   Time 12   Period Weeks   Status Partially Met     OT LONG  TERM GOAL  #9   Baseline Patient will be able to braid her own hair before college   Time 6   Period Months   Status Achieved     OT LONG TERM GOAL  #10   Time 6     OT LONG TERM GOAL  #11   TITLE Will be able to squeeze tooth paste out of the tube when it is less than half full.   Baseline difficulty with small travel  sized, may look into alternative ways since this has been a chronic issue.   Time 6   Period Months   Status On-going     OT LONG TERM GOAL  #12   TITLE Patient will demonstrate managing wheelchair rain poncho in inclement weather with modified independence.   Baseline crutch tips are difficult for anyone to take them off, goal deferred.   Time 6   Period Months   Status Achieved     OT LONG TERM GOAL  #13   TITLE Patient will be able to complete 2 1/2 minutes straight with left shoulder flexion simulating driving with a stearing wheel ball to be able to drive to college.    Baseline Patient has not been driving, her family takes her to school and drops her off.    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    Baseline difficulty coordinating the use of both hands to complete task.      OT LONG TERM GOAL  #15   TITLE Patient will be able to obtain food tray in the cafeteria from a wheelchair level and take to the table without spilling with modified independence.    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 Pt. continues to work on improving UE strength, and coordination skills for use during ADLs, IADLs, and college related tasks.    Rehab Potential Good   OT Frequency 1x / week   OT Duration 12 weeks   OT  Treatment/Interventions Self-care/ADL training;Therapeutic exercise;Therapeutic exercises;Therapeutic activities;DME and/or AE instruction;Patient/family education   Consulted and Agree with Plan of Care Patient      Patient will benefit from skilled therapeutic intervention in order to improve the following deficits and impairments:  Impaired UE functional use, Difficulty walking, Decreased strength, Decreased coordination, Decreased endurance, Decreased balance  Visit Diagnosis: Muscle weakness (generalized)  Other lack of coordination    Problem List There are no active problems to display for this patient.   Harrel Carina, MS, OTR/L 04/05/2017, 4:32 PM  [place] MAIN [facility] SERVICES [address] [place], [place], [address] Phone: [phone]   Fax:  [phone]  Name: HAIDYN KILBURG MRN: [id number] Date of Birth: Sep 27, 1996

## 2017-04-08 ENCOUNTER — Ambulatory Visit: Payer: Medicaid Other

## 2017-04-08 ENCOUNTER — Encounter: Payer: Medicaid Other | Admitting: Occupational Therapy

## 2017-04-13 ENCOUNTER — Ambulatory Visit: Payer: Medicaid Other

## 2017-04-13 DIAGNOSIS — R262 Difficulty in walking, not elsewhere classified: Secondary | ICD-10-CM

## 2017-04-13 DIAGNOSIS — M6281 Muscle weakness (generalized): Secondary | ICD-10-CM

## 2017-04-13 DIAGNOSIS — R278 Other lack of coordination: Secondary | ICD-10-CM

## 2017-04-13 NOTE — Therapy (Signed)
[place] [facility] MA[facility] [address] [place], [place], [address] Phone: [phone]   Fax:  [phone]  Physical Therapy Treatment  Patient Details  Name: Michelle Randall MRN: [id number] Date of Birth: [birth year] No Data Recorded  Encounter Date: 04/13/2017      PT End of Session - 04/13/17 1609    Visit Number 85   Number of Visits 45   Date for PT Re-Evaluation 04/27/17   Authorization Type medicaid   Authorization Time Period beginning 02/03/17 12 visits approved through 04/27/17   PT Start Time 1516   PT Stop Time 1601   PT Time Calculation (min) 45 min   Equipment Utilized During Treatment Gait belt   Activity Tolerance Patient tolerated treatment well   Behavior During Therapy [facility] for tasks assessed/performed      No past medical history on file.  No past surgical history on file.  There were no vitals filed for this visit. There Ex     Subjective Assessment - 04/13/17 1605    Subjective [place] [facility] [place] [facility] and had difficulty getting into and out of cars/ubers due to their height.    Patient is accompained by: Family member   Limitations Walking   Patient Stated Goals Patient wants to improve her core strength.    Currently in Pain? No/denies     Penguin crunches in hooklying - 2 min Reverse crunch with weighted bar in supine with 3# bar - 2 x 20 Modified dead bug 1x12 with swiss ball Cross body dead bug with 2# dumbbell - x14 Bridges with feet on top of physioball - x 20  Step-ups 6in  - x10 B feet Getting in and out of raised car: Broke down into steps, utilized 3 different approaches based on height of car, if handle available, and step availability. 5x.  Neuro Re-ed Standing balance on half foam roller with single fingertip support - 2 x 60sec Side stepping over half foam  with B UE support - x 15  Patient demonstrates increased fatigue at the end of session           PT Long  Term Goals - 01/28/17 1104      PT LONG TERM GOAL #1   Title Patient will improve Dynamic Gait Index (DGI) score to > 19/24 for low falls risk regarding dynamic walking tasks    Baseline 14/24; 11/02/16: 15/24 01/28/17: 18/24   Time 12   Period Weeks   Status On-going     PT LONG TERM GOAL #2   Title Patient will increase Berg Balance score by > 6 points to demonstrate decreased fall risk during functional activities    Baseline 11/02/16: 34/56 01/28/17: 39/56   Time 12   Period Weeks   Status On-going     PT LONG TERM GOAL #3   Title Patient will be able to transfer in and out of a large car with Patient will be able to transfer in and out of a high seated car with CGA    Baseline Patient requires min for transfer into large car   Time 12   Period Weeks   Status On-going     PT LONG TERM GOAL #4   Title Patient will complete a TUG test in < 12 seconds for independent mobility and decreased fall risk    Baseline 11.45   Time 12   Period Weeks   Status Achieved     PT LONG  TERM GOAL #5   Title Patient will improve 6 minute walk distance by > 150 ft for improved return to functional community activities    Baseline 740 01/28/2017: 752ft   Time 12   Status On-going     PT LONG TERM GOAL #6   Title Patient will improve gait speed to > 1.2 m/s with least restrictive assistive device to return to normal walking speed    Baseline .76 m/h 01/28/2017: .87   Time 12   Period Weeks   Status On-going     PT LONG TERM GOAL #7   Title Patient (< 19 years old) will complete five times sit to stand test in < 10 seconds indicating an increased LE strength and improved balance    Baseline 9.45 sec   Time 12   Period Weeks   Status Achieved     PT LONG TERM GOAL #8   Title Patient will be able to ambulate on inclines and grass independenlty with LRAD   Baseline Can ambulate over smooth grass but unable to safely amb over heavy uneven surfaces   Time 12   Period Weeks   Status On-going      PT LONG TERM GOAL  #9   TITLE Patient will be abe to transfer from low chair or stool wihtout UE support independently   Baseline Patient needs min asssit to transfer from a low stool   Period Weeks   Status Achieved     PT LONG TERM GOAL  #10   TITLE Patient will be able to transfer from the floor to standing independently with use of LRAD   Baseline Patient needs min assist to transfer from the floor to standing   Time 12   Period Weeks   Status On-going               Plan - 04/13/17 1631    Clinical Impression Statement Patient responded well to interventions mimicking functional task of car transfers demonstrating understanding. Core stability is improving with fatigue noted at end of session. Pt. Is challenged by uneven surfaces and stepping up and will benefit from increased functional capacity focus next session. Patient will continue to benefit from skilled physical therapy to improve strength, endurance, balance, and quality of life for increased independence with ADLs and negotiating natural environment.   Rehab Potential Good   Clinical Impairments Affecting Rehab Potential weakness and decreased standing balance   PT Frequency 1x / week   PT Duration 12 weeks   PT Treatment/Interventions Therapeutic exercise;Therapeutic activities;Gait training;Balance training;Stair training;DME Instruction;Neuromuscular re-education;Patient/family education   PT Next Visit Plan Bil knee flexion/extension strengthening; Progress LE, core strengthening and balance   PT Home Exercise Plan Continued from previous sessions.    Consulted and Agree with Plan of Care Patient      Patient will benefit from skilled therapeutic intervention in order to improve the following deficits and impairments:  Abnormal gait, Decreased balance, Decreased endurance, Difficulty walking, Decreased strength  Visit Diagnosis: Muscle weakness (generalized)  Other lack of coordination  Difficulty in  walking, not elsewhere classified     Problem List There are no active problems to display for this patient.   Precious Bard, PT, DPT   04/13/2017, 4:32 PM  [place] [facility] MAIN [facility] SERVICES [address] [place], [place], [address] Phone: [phone]   Fax:  [phone]  Name: Michelle Randall MRN: [id number] Date of Birth: 1996-09-24

## 2017-04-14 ENCOUNTER — Encounter: Payer: Self-pay | Admitting: Occupational Therapy

## 2017-04-14 ENCOUNTER — Ambulatory Visit: Payer: Medicaid Other | Admitting: Occupational Therapy

## 2017-04-14 DIAGNOSIS — M6281 Muscle weakness (generalized): Secondary | ICD-10-CM

## 2017-04-14 DIAGNOSIS — R278 Other lack of coordination: Secondary | ICD-10-CM

## 2017-04-14 NOTE — Therapy (Signed)
[place] MAIN [facility] SERVICES [address] [place], [place], [address] Phone: [phone]   Fax:  [phone]  Occupational Therapy Treatment  Patient Details  Name: COLUMBIA PANDEY MRN: [id number] Date of Birth: 03/15/1996 No Data Recorded  Encounter Date: 04/14/2017      OT End of Session - 04/14/17 1634    Visit Number 77   Number of Visits 94   Date for OT Re-Evaluation 06/10/17   OT Start Time 1300   OT Stop Time 1345   OT Time Calculation (min) 45 min   Activity Tolerance Patient tolerated treatment well   Behavior During Therapy [facility] for tasks assessed/performed      History reviewed. No pertinent past medical history.  History reviewed. No pertinent surgical history.  There were no vitals filed for this visit.      Subjective Assessment - 04/14/17 1304    Subjective  Patient reports she just go back from [place] from her sister's graduation.  "ate lots of food".     Patient Stated Goals To be as independent as possible at home and at school.    Currently in Pain? No/denies   Pain Score 0-No pain                      OT Treatments/Exercises (OP) - 04/14/17 1630      Fine Motor Coordination   Other Fine Motor Exercises Patient seen for focus on fine motor coordination activities with emphasis on manipulation of nuts and bolts performed in sitting with cues for thumb finger combination movements. Patient able to complete but continues to demo difficulty with speed.      Neurological Re-education Exercises   Other Exercises 1 Patient seen this date for BUE strengthening exercises with 3#dowel for shoulder flexion, ABD/ADD, chest press, circles, elbow flexion/ext for 15 repetitions for 2 sets, cues for technique.  Green theraputty for bilateral hand grip, lateral pinch, 2 point and 3 point pinch skills for multiple sets and repetitions of 15.                OT Education - 04/14/17 9480    Education  provided Yes   Education Details strength and coordination skills   Person(s) Educated Patient   Methods Explanation;Demonstration;Verbal cues   Comprehension Verbal cues required;Returned demonstration;Verbalized understanding             OT Long Term Goals - 03/27/17 2121      OT LONG TERM GOAL #1   Title Patient will demonstrate methods of picking up her bookbag and placing it onto the back of her chair with modified independence to transport items to class.   Baseline --   Time 6   Period Months   Status Achieved     OT LONG TERM GOAL #2   Title Patient will be able to make her bed with modified independence.   Baseline she continues to have difficulty with balance and completing this task, increased time.   Time 6   Period Months   Status On-going     OT LONG TERM GOAL #3   Title Patient will demonstrate the ability to perform vacuuming of one room with modified independence using light weight vacuum.   Baseline still requires max assist   Time 6   Period Months   Status On-going     OT LONG TERM GOAL #4   Title Patient will demonstrate the ability to open containers with sealed  tops such as on yogurt containers and individual ice cream containers.    Baseline continues to have increased difficulty with sealed, peel away container tops and requires assistance or modifications to open at times   Time 12   Period Weeks   Status On-going     OT LONG TERM GOAL #5   Title Patient will demonstrate the ability to perform small buttons on shirts independently and with good speed in order to dress for school.   Baseline getting easier but if rushed she often does the wrong button holes and will have to have mom to redo.    Time 12   Period Weeks   Status Partially Met     OT LONG TERM GOAL #6   Title Patient will donn and doff small earrings with modified independence.   Baseline mod assist from mom, patient has difficulty holding and dropping with attempts, longer  earring are easier.  Difficulty with small stud earrings.    Time 12   Period Weeks   Status Partially Met     OT LONG TERM GOAL #7   Title Patient will be able to zip her rainboots while they are on her feet with modified independence.     Baseline can operate zipper when not on feet but has difficulty when putting boots on to zip them, continues to require moderate assistance   Time 12   Period Weeks   Status On-going     OT LONG TERM GOAL #8   Title Patient to complete am self care demonstrating efficient methods of dressing skills to get to school on time.    Baseline often has to have her mom help her to get dressed to get to school on time, has been late 1 time in the last week due to slowness in getting ready   Time 12   Period Weeks   Status Partially Met     OT LONG TERM GOAL  #9   Baseline Patient will be able to braid her own hair before college   Time 6   Period Months   Status Achieved     OT LONG TERM GOAL  #10   Time 6     OT LONG TERM GOAL  #11   TITLE Will be able to squeeze tooth paste out of the tube when it is less than half full.   Baseline difficulty with small travel sized, may look into alternative ways since this has been a chronic issue.   Time 6   Period Months   Status On-going     OT LONG TERM GOAL  #12   TITLE Patient will demonstrate managing wheelchair rain poncho in inclement weather with modified independence.   Baseline crutch tips are difficult for anyone to take them off, goal deferred.   Time 6   Period Months   Status Achieved     OT LONG TERM GOAL  #13   TITLE Patient will be able to complete 2 1/2 minutes straight with left shoulder flexion simulating driving with a stearing wheel ball to be able to drive to college.    Baseline Patient has not been driving, her family takes her to school and drops her off.    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    Baseline difficulty coordinating the use of both hands to complete task.      OT LONG TERM GOAL  #15   TITLE Patient will be able to obtain food tray in the cafeteria from a wheelchair level and take to the table without spilling with modified independence.    Baseline max assist   Time 6   Period Months   Status Achieved               Plan - 04/14/17 1635    Clinical Impression Statement Patient continues to work towards goals to improve speed, coordination, dexterity and strength to perform daily tasks at home, work and school.  Patient responds well to cues for proper technique for exercises.    Occupational Profile and client history currently impacting functional performance decreased hand strength, balance and coordination.   Occupational performance deficits (Please refer to evaluation for details): ADL's;IADL's;Rest and Sleep;Education;Work;Leisure;Social Participation   Rehab Potential Good   OT Frequency 1x / week   OT Duration 12 weeks   OT Treatment/Interventions Self-care/ADL training;Therapeutic exercise;Therapeutic exercises;Therapeutic activities;DME and/or AE instruction;Patient/family education   Consulted and Agree with Plan of Care Patient      Patient will benefit from skilled therapeutic intervention in order to improve the following deficits and impairments:  Impaired UE functional use, Difficulty walking, Decreased strength, Decreased coordination, Decreased endurance, Decreased balance  Visit Diagnosis: Muscle weakness (generalized)  Other lack of coordination    Problem List There are no active problems to display for this patient.  Achilles Dunk, OTR/L, CLT  Marysa Wessner 04/14/2017, 4:38 PM  [place] MAIN [facility] SERVICES [address] [place], [place], [address] Phone: [phone]   Fax:   [phone]  Name: TANEASHA FUQUA MRN: [id number] Date of Birth: 08-24-1996

## 2017-04-19 ENCOUNTER — Ambulatory Visit: Payer: Medicaid Other | Attending: Pediatrics | Admitting: Occupational Therapy

## 2017-04-19 ENCOUNTER — Ambulatory Visit: Payer: Medicaid Other

## 2017-04-19 ENCOUNTER — Encounter: Payer: Self-pay | Admitting: Occupational Therapy

## 2017-04-19 DIAGNOSIS — R279 Unspecified lack of coordination: Secondary | ICD-10-CM

## 2017-04-19 DIAGNOSIS — M6281 Muscle weakness (generalized): Secondary | ICD-10-CM | POA: Diagnosis present

## 2017-04-19 DIAGNOSIS — R262 Difficulty in walking, not elsewhere classified: Secondary | ICD-10-CM | POA: Diagnosis present

## 2017-04-19 DIAGNOSIS — R278 Other lack of coordination: Secondary | ICD-10-CM | POA: Insufficient documentation

## 2017-04-19 NOTE — Therapy (Signed)
[facility] MAIN [facility] SERVICES [address] [place], [place], [address] Phone: [phone]   Fax:  [phone]  Physical Therapy Treatment  Patient Details  Name: Michelle Randall MRN: [id number] Date of Birth: 05-07-1996 No Data Recorded  Encounter Date: 04/19/2017      PT End of Session - 04/19/17 1437    Visit Number 86   Number of Visits 98   Date for PT Re-Evaluation 07/12/17   Authorization Type medicaid   Authorization Time Period 1/12   PT Start Time 1346   PT Stop Time 1432   PT Time Calculation (min) 46 min   Equipment Utilized During Treatment Gait belt   Activity Tolerance Patient tolerated treatment well   Behavior During Therapy [facility] for tasks assessed/performed      No past medical history on file.  No past surgical history on file.  There were no vitals filed for this visit.      Subjective Assessment - 04/19/17 1436    Subjective Patient continues to fast for religious holiday and has noted fatigue.    Patient is accompained by: Family member   Limitations Walking   Patient Stated Goals Patient wants to improve her core strength.    Currently in Pain? No/denies            [facility] PT Assessment - 04/19/17 0001      Berg Balance Test   Sit to Stand Able to stand without using hands and stabilize independently   Standing Unsupported Able to stand safely 2 minutes   Sitting with Back Unsupported but Feet Supported on Floor or Stool Able to sit safely and securely 2 minutes   Stand to Sit Sits safely with minimal use of hands   Transfers Able to transfer safely, minor use of hands   Standing Unsupported with Eyes Closed Able to stand 10 seconds safely   Standing Ubsupported with Feet Together Able to place feet together independently and stand 1 minute safely   From Standing, Reach Forward with Outstretched Arm Can reach confidently >25 cm (10")   From Standing Position, Pick up Object from Floor Able to pick  up shoe safely and easily   From Standing Position, Turn to Look Behind Over each Shoulder Looks behind from both sides and weight shifts well   Turn 360 Degrees Able to turn 360 degrees safely but slowly   Standing Unsupported, Alternately Place Feet on Step/Stool Able to complete >2 steps/needs minimal assist  with sticks   Standing Unsupported, One Foot in Front Needs help to step but can hold 15 seconds   Standing on One Leg Tries to lift leg/unable to hold 3 seconds but remains standing independently   Total Score 45     Dynamic Gait Index   Level Surface Normal   Change in Gait Speed Normal   Gait with Horizontal Head Turns Mild Impairment   Gait with Vertical Head Turns Normal   Gait and Pivot Turn Normal   Step Over Obstacle Moderate Impairment   Step Around Obstacles Normal   Steps Moderate Impairment   Total Score 19      TherEx Side planks 2x30 seconds Planks 30 seconds x2 Prone press stretch for hip flexor tightness 2x 60 seconds Single and bilateral leg raise 40 seconds DGI: 19 6 minute walk test: 795 ft  Neuro: Berg: 45 Stair negotiation with trails of use of AD vs handrails, Supervision     Pt. response to medical necessity: Patient  will continue to benefit from skilled physical therapy to improve strength, endurance, balance, and quality of life for increased independence with ADLs and negotiating natural environment.       PT Long Term Goals - 04/19/17 1400      PT LONG TERM GOAL #1   Title Patient will improve Dynamic Gait Index (DGI) score to > 19/24 for low falls risk regarding dynamic walking tasks    Baseline 14/24; 11/02/16: 15/24 01/28/17: 18/24 6/5: 19/24   Time 12   Period Weeks   Status Achieved     PT LONG TERM GOAL #2   Title Patient will increase Berg Balance score by > 6 points to demonstrate decreased fall risk during functional activities    Baseline 11/02/16: 34/56 01/28/17: 39/56 6/5: 45/56   Time 12   Period Weeks   Status  Achieved     PT LONG TERM GOAL #3   Title Patient will be able to transfer in and out of a large car with Patient will be able to transfer in and out of a high seated car with CGA    Baseline Patient requires min for transfer into large car   Time 12   Period Weeks   Status On-going     PT LONG TERM GOAL #4   Title Patient will complete a TUG test in < 12 seconds for independent mobility and decreased fall risk    Baseline 11.45   Time 12   Period Weeks   Status Achieved     PT LONG TERM GOAL #5   Title Patient will improve 6 minute walk distance by > 150 ft for improved return to functional community activities    Baseline 740 01/28/2017: 76190ft 6/5: 795   Time 12   Status On-going     Additional Long Term Goals   Additional Long Term Goals Yes     PT LONG TERM GOAL #6   Title Patient will improve gait speed to > 1.2 m/s with least restrictive assistive device to return to normal walking speed    Baseline .76 m/h 01/28/2017: .87   Time 12   Period Weeks   Status On-going     PT LONG TERM GOAL #7   Title Patient (< 20 years old) will complete five times sit to stand test in < 10 seconds indicating an increased LE strength and improved balance    Baseline 9.45 sec   Time 12   Period Weeks   Status Achieved     PT LONG TERM GOAL #8   Title Patient will be able to ambulate on inclines and grass independenlty with LRAD   Baseline Can ambulate over smooth grass but unable to safely amb over heavy uneven surfaces   Time 12   Period Weeks   Status On-going     PT LONG TERM GOAL  #9   TITLE Patient will be abe to transfer from low chair or stool wihtout UE support independently   Baseline Patient needs min asssit to transfer from a low stool   Period Weeks   Status Achieved     PT LONG TERM GOAL  #10   TITLE Patient will be able to transfer from the floor to standing independently with use of LRAD   Baseline Patient needs min assist to transfer from the floor to standing    Time 12   Period Weeks   Status On-going     PT LONG TERM GOAL  #11   TITLE  Patient will step up onto a 6" step 5x with AD independently to increase community mobility   Baseline Pt. has difficulty stepping up with secondary leg and experiences LOB at this time affecting ability to negotiate curbs   Time 12   Period Weeks   Status New               Plan - 04/19/17 1844    Clinical Impression Statement Patient continues to progress with functional balance and ambulation despite current levels of fatigue from fasting. Walking capacity has not changed drastically in 6 minute walk test due to pt. Being limited in walking at home during fasting period. Berg: 45/56 demonstrates improvement as well as DGI. Pt. Is challenged by uneven surfaces and stepping up and will benefit from increased functional capacity focus. Patient will continue to benefit from skilled physical therapy to improve strength, endurance, balance, and quality of life for increased independence with ADLs and negotiating natural environment.   Rehab Potential Good   Clinical Impairments Affecting Rehab Potential weakness and decreased standing balance   PT Frequency 1x / week   PT Duration 12 weeks   PT Treatment/Interventions Therapeutic exercise;Therapeutic activities;Gait training;Balance training;Stair training;DME Instruction;Neuromuscular re-education;Patient/family education   PT Next Visit Plan plank, stairs, getting up from floor   PT Home Exercise Plan Continued from previous sessions.    Consulted and Agree with Plan of Care Patient      Patient will benefit from skilled therapeutic intervention in order to improve the following deficits and impairments:  Abnormal gait, Decreased balance, Decreased endurance, Difficulty walking, Decreased strength  Visit Diagnosis: Muscle weakness (generalized)  Other lack of coordination  Difficulty in walking, not elsewhere classified  Lack of  coordination     Problem List There are no active problems to display for this patient.   Precious Bard 04/19/2017, 6:48 PM  [place] [facility] MAIN [facility] SERVICES [address] [place], [place], [address] Phone: [phone]   Fax:  [phone]  Name: SAMARIYAH COWLES MRN: [id number] Date of Birth: [birth year]

## 2017-04-21 NOTE — Therapy (Signed)
[place] MAIN [facility] SERVICES [address] [place], [place], [address] Phone: [phone]   Fax:  [phone]  Occupational Therapy Treatment  Patient Details  Name: Michelle Randall MRN: [id number] Date of Birth: 11-16-1995 No Data Recorded  Encounter Date: 04/19/2017      OT End of Session - 04/21/17 1950    Visit Number 55   Number of Visits 94   Date for OT Re-Evaluation 06/10/17   Authorization Type medicaid visit 44 of 40   OT Start Time 1300   OT Stop Time 1345   OT Time Calculation (min) 45 min   Activity Tolerance Patient tolerated treatment well   Behavior During Therapy WFL for tasks assessed/performed      History reviewed. No pertinent past medical history.  History reviewed. No pertinent surgical history.  There were no vitals filed for this visit.      Subjective Assessment - 04/21/17 1944    Subjective  Patient reports she has been fasting for Ramadan and will be going out with friends next week to eat but they will eat later in the evening.     Patient Stated Goals To be as independent as possible at home and at school.    Currently in Pain? No/denies   Pain Score 0-No pain                      OT Treatments/Exercises (OP) - 04/21/17 1944      ADLs   ADL Comments Patient instructed on squeeze a tube tool for getting toothpaste out of the tube, patient able to demo use and understanding.  She may need assistance at home to place the tool onto the tube for setup and then use independently.      Fine Motor Coordination   Other Fine Motor Exercises Patient seen for fine motor coordination tasks with bilateral hand use with small 1/2 inch pegs to pick  up from flat tabletop, move from fingertip to palm and back to fingertip to place into board with cues. Able to hold up to 10 in hand for storage. 100 pegboard to pick up turn/flip to opposite end and place into the board with cues for speed and  dexterity.      Neurological Re-education Exercises   Other Exercises 1 Patient seen for BUE strengthening with 2# hand weights for overhead press, chest pressm ABD and elbow flexion/ext for 10 repetitions for 2 sets.  Grip strength for 17# for 25 repetitions.                  OT Education - 04/21/17 1949    Education provided Yes   Education Details fine motor coordination tasks, strengthening with weights   Person(s) Educated Patient   Methods Explanation;Demonstration;Verbal cues   Comprehension Verbal cues required;Returned demonstration;Verbalized understanding             OT Long Term Goals - 03/27/17 2121      OT LONG TERM GOAL #1   Title Patient will demonstrate methods of picking up her bookbag and placing it onto the back of her chair with modified independence to transport items to class.   Baseline --   Time 6   Period Months   Status Achieved     OT LONG TERM GOAL #2   Title Patient will be able to make her bed with modified independence.   Baseline she continues to have difficulty with balance and completing this task,  increased time.   Time 6   Period Months   Status On-going     OT LONG TERM GOAL #3   Title Patient will demonstrate the ability to perform vacuuming of one room with modified independence using light weight vacuum.   Baseline still requires max assist   Time 6   Period Months   Status On-going     OT LONG TERM GOAL #4   Title Patient will demonstrate the ability to open containers with sealed tops such as on yogurt containers and individual ice cream containers.    Baseline continues to have increased difficulty with sealed, peel away container tops and requires assistance or modifications to open at times   Time 12   Period Weeks   Status On-going     OT LONG TERM GOAL #5   Title Patient will demonstrate the ability to perform small buttons on shirts independently and with good speed in order to dress for school.   Baseline  getting easier but if rushed she often does the wrong button holes and will have to have mom to redo.    Time 12   Period Weeks   Status Partially Met     OT LONG TERM GOAL #6   Title Patient will donn and doff small earrings with modified independence.   Baseline mod assist from mom, patient has difficulty holding and dropping with attempts, longer earring are easier.  Difficulty with small stud earrings.    Time 12   Period Weeks   Status Partially Met     OT LONG TERM GOAL #7   Title Patient will be able to zip her rainboots while they are on her feet with modified independence.     Baseline can operate zipper when not on feet but has difficulty when putting boots on to zip them, continues to require moderate assistance   Time 12   Period Weeks   Status On-going     OT LONG TERM GOAL #8   Title Patient to complete am self care demonstrating efficient methods of dressing skills to get to school on time.    Baseline often has to have her mom help her to get dressed to get to school on time, has been late 1 time in the last week due to slowness in getting ready   Time 12   Period Weeks   Status Partially Met     OT LONG TERM GOAL  #9   Baseline Patient will be able to braid her own hair before college   Time 6   Period Months   Status Achieved     OT LONG TERM GOAL  #10   Time 6     OT LONG TERM GOAL  #11   TITLE Will be able to squeeze tooth paste out of the tube when it is less than half full.   Baseline difficulty with small travel sized, may look into alternative ways since this has been a chronic issue.   Time 6   Period Months   Status On-going     OT LONG TERM GOAL  #12   TITLE Patient will demonstrate managing wheelchair rain poncho in inclement weather with modified independence.   Baseline crutch tips are difficult for anyone to take them off, goal deferred.   Time 6   Period Months   Status Achieved     OT LONG TERM GOAL  #13   TITLE Patient will be able  to complete 2 1/2  minutes straight with left shoulder flexion simulating driving with a stearing wheel ball to be able to drive to college.    Baseline Patient has not been driving, her family takes her to school and drops her off.    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    Baseline difficulty coordinating the use of both hands to complete task.      OT LONG TERM GOAL  #15   TITLE Patient will be able to obtain food tray in the cafeteria from a wheelchair level and take to the table without spilling with modified independence.    Baseline max assist   Time 6   Period Months   Status Achieved               Plan - 04/21/17 1950    Clinical Impression Statement Patient demonstrates continued difficulty with management of toothpaste squeezing tube when its 1/2 full or less.  Issued squeeze a tube tool for her personal tube of paste at home.  She demos understanding regarding use.  She continues to work towards speed and dexterity with bilateral UEs towards goals.     Rehab Potential Good   OT Frequency 1x / week   OT Treatment/Interventions Self-care/ADL training;Therapeutic exercise;Therapeutic exercises;Therapeutic activities;DME and/or AE instruction;Patient/family education   Consulted and Agree with Plan of Care Patient      Patient will benefit from skilled therapeutic intervention in order to improve the following deficits and impairments:  Impaired UE functional use, Difficulty walking, Decreased strength, Decreased coordination, Decreased endurance, Decreased balance  Visit Diagnosis: Muscle weakness (generalized)  Other lack of coordination    Problem List There are no active problems to display for this patient.  Achilles Dunk, OTR/L, CLT  Jenean Escandon 04/21/2017, 7:53 PM  [place] MAIN [facility] SERVICES [address] [place], [place], [address] Phone: [phone]   Fax:  [phone]  Name: KATHLEENA FREEMAN MRN: [id number] Date of Birth: 1996/11/09

## 2017-04-25 ENCOUNTER — Encounter: Payer: Self-pay | Admitting: Occupational Therapy

## 2017-04-25 ENCOUNTER — Encounter: Payer: Self-pay | Admitting: Physical Therapy

## 2017-04-25 ENCOUNTER — Ambulatory Visit: Payer: Medicaid Other | Admitting: Physical Therapy

## 2017-04-25 ENCOUNTER — Ambulatory Visit: Payer: Medicaid Other | Admitting: Occupational Therapy

## 2017-04-25 DIAGNOSIS — R278 Other lack of coordination: Secondary | ICD-10-CM

## 2017-04-25 DIAGNOSIS — R262 Difficulty in walking, not elsewhere classified: Secondary | ICD-10-CM

## 2017-04-25 DIAGNOSIS — M6281 Muscle weakness (generalized): Secondary | ICD-10-CM

## 2017-04-25 DIAGNOSIS — R279 Unspecified lack of coordination: Secondary | ICD-10-CM

## 2017-04-25 NOTE — Therapy (Signed)
[facility] MAIN [facility] SERVICES [address] [place], [place], [address] Phone: [phone]   Fax:  [phone]  Physical Therapy Treatment  Patient Details  Name: Michelle Randall MRN: [id number] Date of Birth: June 10, 1996 No Data Recorded  Encounter Date: 04/25/2017      PT End of Session - 04/25/17 1632    Visit Number 87   Number of Visits 98   Date for PT Re-Evaluation 07/12/17   Authorization Type medicaid   Authorization Time Period beginning 02/03/17 12 visits approved through 04/27/17   PT Start Time 0245   PT Stop Time 0330   PT Time Calculation (min) 45 min   Equipment Utilized During Treatment Gait belt   Activity Tolerance Patient tolerated treatment well   Behavior During Therapy [facility] for tasks assessed/performed      History reviewed. No pertinent past medical history.  History reviewed. No pertinent surgical history.  There were no vitals filed for this visit.      Subjective Assessment - 04/25/17 1630    Subjective Patient continues to be doing fine today.    Patient is accompained by: Family member   Limitations Walking   Patient Stated Goals Patient wants to improve her core strength.    Currently in Pain? No/denies   Pain Score 0-No pain   Pain Onset In the past 7 days   Multiple Pain Sites No      TREATMENT  Therapeutic Exercise Planks side x 2 each side 30 sec x 2 Leg press 100 lbs x 20 x 3 Step ups to 8" step  - x15 B LE Eccentric step downs with 4 inch step x 20 Left and right step ups x 15 LLE and RLE Side stepping up 8" in  bars - x 20 LLE and RLE Fwd stepping and bwds stepping in parallel bars x 10 lengths of the bars  Patient needs min assist and CGA and cues for posture correction. Patient has loss of balance with stepping in parallel bars without UE support.  Patient has the following on going goals that she is working towards: #3: to get into a care more easily; #5: to ambulate further  distances; #6 to improve her gait speed and decrease her falls risk; # 8 to ambulate in grass and gravel with better motor control,; #10 to perform floor to stand transfer independently; # 11 to perform a curb with Lofstrand crutches safely.                           PT Education - 04/25/17 1631    Education provided Yes   Education Details safety with stepping   Person(s) Educated Patient   Methods Explanation   Comprehension Verbalized understanding             PT Long Term Goals - 04/25/17 1508      PT LONG TERM GOAL #1   Title Patient will improve Dynamic Gait Index (DGI) score to > 19/24 for low falls risk regarding dynamic walking tasks    Baseline 14/24; 11/02/16: 15/24 01/28/17: 18/24 6/5: 19/24   Time 12   Period Weeks   Status Achieved     PT LONG TERM GOAL #2   Title Patient will increase Berg Balance score by > 6 points to demonstrate decreased fall risk during functional activities    Baseline 11/02/16: 34/56 01/28/17: 39/56 6/5: 45/56   Time 12   Period Tania Ade  Status Achieved     PT LONG TERM GOAL #3   Title Patient will be able to transfer in and out of a large car with Patient will be able to transfer in and out of a high seated car with CGA    Baseline Patient requires min for transfer into large car   Time 12   Period Weeks   Status On-going     PT LONG TERM GOAL #4   Title Patient will complete a TUG test in < 12 seconds for independent mobility and decreased fall risk    Baseline 11.45   Time 12   Period Weeks   Status Achieved     PT LONG TERM GOAL #5   Title Patient will improve 6 minute walk distance by > 150 ft for improved return to functional community activities    Baseline 740 01/28/2017: 71090ft 6/5: 795   Time 12   Status On-going     PT LONG TERM GOAL #6   Title Patient will improve gait speed to > 1.2 m/s with least restrictive assistive device to return to normal walking speed    Baseline .76 m/h 01/28/2017: .87    Time 12   Period Weeks   Status On-going     PT LONG TERM GOAL #7   Title Patient (< 20 years old) will complete five times sit to stand test in < 10 seconds indicating an increased LE strength and improved balance    Baseline 9.45 sec   Time 12   Period Weeks   Status Achieved     PT LONG TERM GOAL #8   Title Patient will be able to ambulate on inclines and grass independenlty with LRAD   Baseline Can ambulate over smooth grass but unable to safely amb over heavy uneven surfaces or gravel, need loftstrand curtches   Time 12   Period Weeks   Status On-going     PT LONG TERM GOAL  #9   TITLE Patient will be abe to transfer from low chair or stool wihtout UE support independently   Baseline Patient needs min asssit to transfer from a low stool   Period Weeks   Status Achieved     PT LONG TERM GOAL  #10   TITLE Patient will be able to transfer from the floor to standing independently with use of LRAD   Baseline Patient needs min assist to transfer from the floor to standing   Time 12   Period Weeks   Status On-going     PT LONG TERM GOAL  #11   TITLE Patient will step up onto a 6" step 5x with AD independently to increase community mobility   Baseline Pt. has difficulty stepping up with secondary leg and experiences LOB at this time affecting ability to negotiate curbs   Time 12   Period Weeks   Status New               Plan - 04/25/17 1634    Clinical Impression Statement Pt demonstrates decreased L LE strength wih performing step ups onto 8" step incidated by increased difficulty with performance. Patient demonstrates decreased balance requiring intermittent UE support to perform balance activites. Patient will benefit from further skilled therapy focussed on improving strength to decrease fall risk .Patient has the following on going goals that she is working towards: #3: to get into a care more easily; #5: to ambulate increased distances; #6 to improve her gait  speed and decrease her  falls risk; # 8 to ambulate in grass and gravel with better motor control,; #10 to perform floor to stand transfer independently; # 11 to perform a curb with Lofstrand crutches safely. Patient is progressing towards all goals with difficulty performing ambulation in gravel and grass and getting into a higher car as well as stepping up to a curb and performing steps and stairs.     Rehab Potential Good   Clinical Impairments Affecting Rehab Potential weakness and decreased standing balance   PT Frequency 1x / week   PT Duration 12 weeks   PT Treatment/Interventions Therapeutic exercise;Therapeutic activities;Gait training;Balance training;Stair training;DME Instruction;Neuromuscular re-education;Patient/family education   PT Next Visit Plan plank, stairs, getting up from floor   PT Home Exercise Plan Continued from previous sessions.    Consulted and Agree with Plan of Care Patient      Patient will benefit from skilled therapeutic intervention in order to improve the following deficits and impairments:  Abnormal gait, Decreased balance, Decreased endurance, Difficulty walking, Decreased strength  Visit Diagnosis: Muscle weakness (generalized)  Other lack of coordination  Difficulty in walking, not elsewhere classified  Lack of coordination     Problem List There are no active problems to display for this patient.  [name], PT, DPT [place], [place] 04/25/2017, 4:40 PM  [place] [facility] MAIN [facility] SERVICES [address] [place], [place], [address] Phone: [phone]   Fax:  [phone]  Name: Michelle Randall MRN: [id number] Date of Birth: 12-27-1995

## 2017-04-25 NOTE — Therapy (Signed)
[place] MAIN [facility] SERVICES [address] [place], [place], [address] Phone: [phone]   Fax:  [phone]  Occupational Therapy Treatment  Patient Details  Name: Michelle Randall MRN: [id number] Date of Birth: Apr 06, 1996 No Data Recorded  Encounter Date: 04/25/2017      OT End of Session - 04/25/17 1617    Visit Number 64   Number of Visits 94   Date for OT Re-Evaluation 06/10/17   Authorization Type medicaid visit 89 of 44   OT Start Time 1530   OT Stop Time 1615   OT Time Calculation (min) 45 min   Activity Tolerance Patient tolerated treatment well   Behavior During Therapy [facility] for tasks assessed/performed      History reviewed. No pertinent past medical history.  History reviewed. No pertinent surgical history.  There were no vitals filed for this visit.      Subjective Assessment - 04/25/17 1721    Subjective  Pt. changed the appointment from wednesday to this afternoon secondary to a conflict.    Patient is accompained by: Family member   Currently in Pain? No/denies      OT TREATMENT    Neuro muscular re-education:  Pt. worked on grasping extra small beads, and placing them on an extra small dowel with her right hand. Pt. Worked with long nosed tweezers to grasp small pegs.  Therapeutic Exercises:  Pt. worked on green theraputty exercises. Pt. worked on gross gripping, gross digit extension, thumb abduction, thumb opposition, lateral pinch, and 3pt. pinch strength. Pt. required visual demonstration, and verbal cues.                            OT Education - 04/25/17 1725    Education provided Yes   Education Details [facility]   Person(s) Educated Patient   Methods Explanation   Comprehension Verbalized understanding             OT Long Term Goals - 03/27/17 2121      OT LONG TERM GOAL #1   Title Patient will demonstrate methods of picking up her bookbag and placing it onto the  back of her chair with modified independence to transport items to class.   Baseline --   Time 6   Period Months   Status Achieved     OT LONG TERM GOAL #2   Title Patient will be able to make her bed with modified independence.   Baseline she continues to have difficulty with balance and completing this task, increased time.   Time 6   Period Months   Status On-going     OT LONG TERM GOAL #3   Title Patient will demonstrate the ability to perform vacuuming of one room with modified independence using light weight vacuum.   Baseline still requires max assist   Time 6   Period Months   Status On-going     OT LONG TERM GOAL #4   Title Patient will demonstrate the ability to open containers with sealed tops such as on yogurt containers and individual ice cream containers.    Baseline continues to have increased difficulty with sealed, peel away container tops and requires assistance or modifications to open at times   Time 12   Period Weeks   Status On-going     OT LONG TERM GOAL #5   Title Patient will demonstrate the ability to perform small buttons on shirts independently and  with good speed in order to dress for school.   Baseline getting easier but if rushed she often does the wrong button holes and will have to have mom to redo.    Time 12   Period Weeks   Status Partially Met     OT LONG TERM GOAL #6   Title Patient will donn and doff small earrings with modified independence.   Baseline mod assist from mom, patient has difficulty holding and dropping with attempts, longer earring are easier.  Difficulty with small stud earrings.    Time 12   Period Weeks   Status Partially Met     OT LONG TERM GOAL #7   Title Patient will be able to zip her rainboots while they are on her feet with modified independence.     Baseline can operate zipper when not on feet but has difficulty when putting boots on to zip them, continues to require moderate assistance   Time 12   Period  Weeks   Status On-going     OT LONG TERM GOAL #8   Title Patient to complete am self care demonstrating efficient methods of dressing skills to get to school on time.    Baseline often has to have her mom help her to get dressed to get to school on time, has been late 1 time in the last week due to slowness in getting ready   Time 12   Period Weeks   Status Partially Met     OT LONG TERM GOAL  #9   Baseline Patient will be able to braid her own hair before college   Time 6   Period Months   Status Achieved     OT LONG TERM GOAL  #10   Time 6     OT LONG TERM GOAL  #11   TITLE Will be able to squeeze tooth paste out of the tube when it is less than half full.   Baseline difficulty with small travel sized, may look into alternative ways since this has been a chronic issue.   Time 6   Period Months   Status On-going     OT LONG TERM GOAL  #12   TITLE Patient will demonstrate managing wheelchair rain poncho in inclement weather with modified independence.   Baseline crutch tips are difficult for anyone to take them off, goal deferred.   Time 6   Period Months   Status Achieved     OT LONG TERM GOAL  #13   TITLE Patient will be able to complete 2 1/2 minutes straight with left shoulder flexion simulating driving with a stearing wheel ball to be able to drive to college.    Baseline Patient has not been driving, her family takes her to school and drops her off.    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    Baseline difficulty coordinating the use of both hands to complete task.      OT LONG TERM GOAL  #15   TITLE Patient will be able to obtain food tray in the cafeteria from a wheelchair level and take to the table without spilling with modified independence.    Baseline max assist   Time 6   Period  Months   Status Achieved             Patient will benefit from skilled therapeutic intervention in order to improve the following deficits and impairments:     Visit Diagnosis: Muscle weakness (generalized)  Other lack of coordination    Problem List There are no active problems to display for this patient.   Harrel Carina, MS, OTR/L 04/25/2017, 5:28 PM  [place] MAIN [facility] SERVICES [address] [place], [place], [address] Phone: [phone]   Fax:  [phone]  Name: Michelle Randall MRN: [id number] Date of Birth: 12-Apr-1996

## 2017-04-27 ENCOUNTER — Ambulatory Visit: Payer: Medicaid Other | Admitting: Occupational Therapy

## 2017-04-27 ENCOUNTER — Ambulatory Visit: Payer: Medicaid Other | Admitting: Physical Therapy

## 2017-05-04 ENCOUNTER — Ambulatory Visit: Payer: Medicaid Other | Admitting: Occupational Therapy

## 2017-05-04 ENCOUNTER — Encounter: Payer: Self-pay | Admitting: Physical Therapy

## 2017-05-04 ENCOUNTER — Ambulatory Visit: Payer: Medicaid Other | Admitting: Physical Therapy

## 2017-05-04 ENCOUNTER — Encounter: Payer: Self-pay | Admitting: Occupational Therapy

## 2017-05-04 DIAGNOSIS — R262 Difficulty in walking, not elsewhere classified: Secondary | ICD-10-CM

## 2017-05-04 DIAGNOSIS — R278 Other lack of coordination: Secondary | ICD-10-CM

## 2017-05-04 DIAGNOSIS — R279 Unspecified lack of coordination: Secondary | ICD-10-CM

## 2017-05-04 DIAGNOSIS — M6281 Muscle weakness (generalized): Secondary | ICD-10-CM

## 2017-05-04 NOTE — Therapy (Signed)
[place] [facility] MAIN [facility] SERVICES [address] [place], [place], [address] Phone: [phone]   Fax:  [phone]  Physical Therapy Treatment  Patient Details  Name: Michelle Randall MRN: [id number] Date of Birth: Feb 01, 1996 No Data Recorded  Encounter Date: 05/04/2017      PT End of Session - 05/04/17 1117    Visit Number 88   Number of Visits 98   Date for PT Re-Evaluation 07/12/17   Authorization Type medicaid 1/12   Authorization Time Period 12 visits 6/20-9/11/18   PT Start Time 1045   PT Stop Time 1130   PT Time Calculation (min) 45 min   Equipment Utilized During Treatment Gait belt   Activity Tolerance Patient tolerated treatment well   Behavior During Therapy [facility] for tasks assessed/performed      History reviewed. No pertinent past medical history.  History reviewed. No pertinent surgical history.  There were no vitals filed for this visit.      Subjective Assessment - 05/04/17 1115    Subjective Patient continues to be doing fine today.    Patient is accompained by: Family member   Limitations Walking   Patient Stated Goals Patient wants to improve her core strength.    Currently in Pain? No/denies   Pain Score 0-No pain   Pain Onset In the past 7 days   Pain Frequency Occasional   Multiple Pain Sites No      Treatment: Bridging x 20 x 2 Planks sidelying and prone x 30 sec x 3 Tall kneeling with theraball with weight shifting left and right x 15 minutes BLE hip stretching prone x 30 sec x 3  Quadriped Alternating Ue exrension x 10 x 2 Cues for posture and correct technique                            PT Education - 05/04/17 1116    Education provided Yes   Education Details [facility]   Person(s) Educated Patient   Methods Explanation   Comprehension Verbalized understanding             PT Long Term Goals - 04/25/17 1508      PT LONG TERM GOAL #1   Title Patient will improve  Dynamic Gait Index (DGI) score to > 19/24 for low falls risk regarding dynamic walking tasks    Baseline 14/24; 11/02/16: 15/24 01/28/17: 18/24 6/5: 19/24   Time 12   Period Weeks   Status Achieved     PT LONG TERM GOAL #2   Title Patient will increase Berg Balance score by > 6 points to demonstrate decreased fall risk during functional activities    Baseline 11/02/16: 34/56 01/28/17: 39/56 6/5: 45/56   Time 12   Period Weeks   Status Achieved     PT LONG TERM GOAL #3   Title Patient will be able to transfer in and out of a large car with Patient will be able to transfer in and out of a high seated car with CGA    Baseline Patient requires min for transfer into large car   Time 12   Period Weeks   Status On-going     PT LONG TERM GOAL #4   Title Patient will complete a TUG test in < 12 seconds for independent mobility and decreased fall risk    Baseline 11.45   Time 12   Period Weeks   Status Achieved  PT LONG TERM GOAL #5   Title Patient will improve 6 minute walk distance by > 150 ft for improved return to functional community activities    Baseline 740 01/28/2017: 755ft 6/5: 795   Time 12   Status On-going     PT LONG TERM GOAL #6   Title Patient will improve gait speed to > 1.2 m/s with least restrictive assistive device to return to normal walking speed    Baseline .76 m/h 01/28/2017: .87   Time 12   Period Weeks   Status On-going     PT LONG TERM GOAL #7   Title Patient (< 72 years old) will complete five times sit to stand test in < 10 seconds indicating an increased LE strength and improved balance    Baseline 9.45 sec   Time 12   Period Weeks   Status Achieved     PT LONG TERM GOAL #8   Title Patient will be able to ambulate on inclines and grass independenlty with LRAD   Baseline Can ambulate over smooth grass but unable to safely amb over heavy uneven surfaces or gravel, need loftstrand curtches   Time 12   Period Weeks   Status On-going     PT LONG  TERM GOAL  #9   TITLE Patient will be abe to transfer from low chair or stool wihtout UE support independently   Baseline Patient needs min asssit to transfer from a low stool   Period Weeks   Status Achieved     PT LONG TERM GOAL  #10   TITLE Patient will be able to transfer from the floor to standing independently with use of LRAD   Baseline Patient needs min assist to transfer from the floor to standing   Time 12   Period Weeks   Status On-going     PT LONG TERM GOAL  #11   TITLE Patient will step up onto a 6" step 5x with AD independently to increase community mobility   Baseline Pt. has difficulty stepping up with secondary leg and experiences LOB at this time affecting ability to negotiate curbs   Time 12   Period Weeks   Status New               Plan - 05/04/17 1141    Clinical Impression Statement Pt demonstrates decreased L LE strength wih performing step ups onto 8" step incidated by increased difficulty with performance. Patient demonstrates decreased balance requiring intermittent UE support to perform balance activites. Patient will benefit from further skilled therapy focussed on improving strength to decrease fall risk.    Rehab Potential Good   Clinical Impairments Affecting Rehab Potential weakness and decreased standing balance   PT Frequency 1x / week   PT Duration 12 weeks   PT Treatment/Interventions Therapeutic exercise;Therapeutic activities;Gait training;Balance training;Stair training;DME Instruction;Neuromuscular re-education;Patient/family education   PT Next Visit Plan plank, stairs, getting up from floor   PT Home Exercise Plan Continued from previous sessions.    Consulted and Agree with Plan of Care Patient      Patient will benefit from skilled therapeutic intervention in order to improve the following deficits and impairments:  Abnormal gait, Decreased balance, Decreased endurance, Difficulty walking, Decreased strength  Visit  Diagnosis: Muscle weakness (generalized)  Other lack of coordination  Difficulty in walking, not elsewhere classified  Lack of coordination     Problem List There are no active problems to display for this patient. [name], PT, DPT  Ezekiel InaMansfield, Airabella Barley S 05/04/2017, 11:43 AM  [place] [facility] [address] [place], [place], [address] Phone: [phone]   Fax:  [phone]  Name: Michelle Randall MRN: [id number] Date of Birth: 02-May-1996

## 2017-05-04 NOTE — Therapy (Signed)
[place] MAIN [facility] SERVICES [address] [place], [place], [address] Phone: [phone]   Fax:  [phone]  Occupational Therapy Treatment  Patient Details  Name: Michelle Randall MRN: [id number] Date of Birth: December 23, 1995 No Data Recorded  Encounter Date: 05/04/2017      OT End of Session - 05/04/17 1632    Visit Number 40   Number of Visits 94   Date for OT Re-Evaluation 06/10/17   Authorization Type medicaid visit 30 of 59   OT Start Time 0945   OT Stop Time 1030   OT Time Calculation (min) 45 min   Activity Tolerance Patient tolerated treatment well   Behavior During Therapy [facility] for tasks assessed/performed      History reviewed. No pertinent past medical history.  History reviewed. No pertinent surgical history.  There were no vitals filed for this visit.      Subjective Assessment - 05/04/17 1629    Subjective  Patient reports she is having a good summer, just hanging out and relaxing with family.     Patient Stated Goals To be as independent as possible at home and at school.    Currently in Pain? No/denies   Pain Score 0-No pain                      OT Treatments/Exercises (OP) - 05/04/17 1639      Fine Motor Coordination   Other Fine Motor Exercises Patient was seen for reaching tasks with 1# wrist weights on each wrist, reaching towards 65 degree wedge with resistive velcro board placed on with emphasis on finger strength to place and remove velcro pieces on and off the elevated plane. Patient also seen for resistive pins to place onto bulletin board while board was also in elevated plane of motion.  Cues for placement and reach of pins.                OT Education - 05/04/17 1630    Education provided Yes   Education Details working from elevated surfaces to build up arm strength to help with items such as hair braiding, hanging clothes.     Person(s) Educated Patient   Methods  Explanation;Demonstration;Verbal cues   Comprehension Verbal cues required;Returned demonstration;Verbalized understanding             OT Long Term Goals - 03/27/17 2121      OT LONG TERM GOAL #1   Title Patient will demonstrate methods of picking up her bookbag and placing it onto the back of her chair with modified independence to transport items to class.   Baseline --   Time 6   Period Months   Status Achieved     OT LONG TERM GOAL #2   Title Patient will be able to make her bed with modified independence.   Baseline she continues to have difficulty with balance and completing this task, increased time.   Time 6   Period Months   Status On-going     OT LONG TERM GOAL #3   Title Patient will demonstrate the ability to perform vacuuming of one room with modified independence using light weight vacuum.   Baseline still requires max assist   Time 6   Period Months   Status On-going     OT LONG TERM GOAL #4   Title Patient will demonstrate the ability to open containers with sealed tops such as on yogurt containers and individual ice cream  containers.    Baseline continues to have increased difficulty with sealed, peel away container tops and requires assistance or modifications to open at times   Time 12   Period Weeks   Status On-going     OT LONG TERM GOAL #5   Title Patient will demonstrate the ability to perform small buttons on shirts independently and with good speed in order to dress for school.   Baseline getting easier but if rushed she often does the wrong button holes and will have to have mom to redo.    Time 12   Period Weeks   Status Partially Met     OT LONG TERM GOAL #6   Title Patient will donn and doff small earrings with modified independence.   Baseline mod assist from mom, patient has difficulty holding and dropping with attempts, longer earring are easier.  Difficulty with small stud earrings.    Time 12   Period Weeks   Status Partially Met      OT LONG TERM GOAL #7   Title Patient will be able to zip her rainboots while they are on her feet with modified independence.     Baseline can operate zipper when not on feet but has difficulty when putting boots on to zip them, continues to require moderate assistance   Time 12   Period Weeks   Status On-going     OT LONG TERM GOAL #8   Title Patient to complete am self care demonstrating efficient methods of dressing skills to get to school on time.    Baseline often has to have her mom help her to get dressed to get to school on time, has been late 1 time in the last week due to slowness in getting ready   Time 12   Period Weeks   Status Partially Met     OT LONG TERM GOAL  #9   Baseline Patient will be able to braid her own hair before college   Time 6   Period Months   Status Achieved     OT LONG TERM GOAL  #10   Time 6     OT LONG TERM GOAL  #11   TITLE Will be able to squeeze tooth paste out of the tube when it is less than half full.   Baseline difficulty with small travel sized, may look into alternative ways since this has been a chronic issue.   Time 6   Period Months   Status On-going     OT LONG TERM GOAL  #12   TITLE Patient will demonstrate managing wheelchair rain poncho in inclement weather with modified independence.   Baseline crutch tips are difficult for anyone to take them off, goal deferred.   Time 6   Period Months   Status Achieved     OT LONG TERM GOAL  #13   TITLE Patient will be able to complete 2 1/2 minutes straight with left shoulder flexion simulating driving with a stearing wheel ball to be able to drive to college.    Baseline Patient has not been driving, her family takes her to school and drops her off.    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     Baseline  difficulty coordinating the use of both hands to complete task.      OT LONG TERM GOAL  #15   TITLE Patient will be able to obtain food tray in the cafeteria from a wheelchair level and take to the table without spilling with modified independence.    Baseline max assist   Time 6   Period Months   Status Achieved               Plan - 05/04/17 1632    Clinical Impression Statement Patient continues to work towards improving strength and coordination of bilateral UEs to assist with tasks at home.  Her finger strength is improving to place pins onto bulletin board to hang papers.  Continue to work towards goals to increase independence in school and home tasks.   Rehab Potential Good   OT Frequency 1x / week   OT Duration 12 weeks   OT Treatment/Interventions Self-care/ADL training;Therapeutic exercise;Therapeutic exercises;Therapeutic activities;DME and/or AE instruction;Patient/family education   Consulted and Agree with Plan of Care Patient      Patient will benefit from skilled therapeutic intervention in order to improve the following deficits and impairments:  Impaired UE functional use, Difficulty walking, Decreased strength, Decreased coordination, Decreased endurance, Decreased balance  Visit Diagnosis: Muscle weakness (generalized)  Other lack of coordination    Problem List There are no active problems to display for this patient.  Achilles Dunk, OTR/L, CLT  Corbyn Steedman 05/04/2017, 4:47 PM  [place] MAIN [facility] SERVICES [address] [place], [place], [address] Phone: [phone]   Fax:  [phone]  Name: Michelle Randall MRN: [id number] Date of Birth: 1996/02/02

## 2017-05-11 ENCOUNTER — Ambulatory Visit: Payer: Medicaid Other | Admitting: Occupational Therapy

## 2017-05-11 ENCOUNTER — Encounter: Payer: Self-pay | Admitting: Occupational Therapy

## 2017-05-11 ENCOUNTER — Encounter: Payer: Self-pay | Admitting: Physical Therapy

## 2017-05-11 ENCOUNTER — Ambulatory Visit: Payer: Medicaid Other | Admitting: Physical Therapy

## 2017-05-11 DIAGNOSIS — M6281 Muscle weakness (generalized): Secondary | ICD-10-CM | POA: Diagnosis not present

## 2017-05-11 DIAGNOSIS — R262 Difficulty in walking, not elsewhere classified: Secondary | ICD-10-CM

## 2017-05-11 DIAGNOSIS — R279 Unspecified lack of coordination: Secondary | ICD-10-CM

## 2017-05-11 DIAGNOSIS — R278 Other lack of coordination: Secondary | ICD-10-CM

## 2017-05-11 NOTE — Therapy (Signed)
[place] MAIN [facility] SERVICES [address] [place], [place], [address] Phone: [phone]   Fax:  [phone]  Occupational Therapy Treatment  Patient Details  Name: Michelle Randall MRN: [id number] Date of Birth: 1996/10/14 No Data Recorded  Encounter Date: 05/11/2017      OT End of Session - 05/11/17 1045    Visit Number 81   Number of Visits 94   Date for OT Re-Evaluation 06/10/17   Authorization Type medicaid visit 70 of 31   OT Start Time 0945   OT Stop Time 1031   OT Time Calculation (min) 46 min   Activity Tolerance Patient tolerated treatment well   Behavior During Therapy [facility] for tasks assessed/performed      History reviewed. No pertinent past medical history.  History reviewed. No pertinent surgical history.  There were no vitals filed for this visit.      Subjective Assessment - 05/11/17 0945    Subjective  Patient reports she is eating more ice cream since it is summer and she has difficulty with scooping the ice cream out of the container, ice cream is hard and she doesn't feel she has the strength to scoop it.   Patient Stated Goals To be as independent as possible at home and at school.    Currently in Pain? No/denies   Pain Score 0-No pain                      OT Treatments/Exercises (OP) - 05/11/17 1039      ADLs   ADL Comments Patient reporting difficulty with scooping ice cream, patient seen for scooping using resistive putty with cues to dig into with scoop, pull forwards and rotate wrist towards supination.  Multiple repetitions and trials completed.       Fine Motor Coordination   Other Fine Motor Exercises Patient seen for fine motor coordination with small sticks with cues for opposition from thumb to index, thumb to middle finger.      Neurological Re-education Exercises   Other Exercises 1 Patient seen for overhead reaching task for repeated repetitions (42) for 4 sets each.  Cues  provided for technique.                      OT Long Term Goals - 05/11/17 2263      OT LONG TERM GOAL #1   Title Patient will demonstrate increased strength in right hand sufficient to scoop ice cream from frozen container with modified independence.   Baseline unable to scoop ice cream and mom has to provide total assist.    Time 12   Period Months   Status New     OT LONG TERM GOAL #2   Title Patient will be able to make her bed with modified independence.   Baseline she continues to have difficulty with balance and completing this task, increased time.   Time 6   Period Months   Status On-going     OT LONG TERM GOAL #3   Title Patient will demonstrate the ability to perform vacuuming of one room with modified independence using light weight vacuum.   Baseline still requires max assist   Time 6   Period Months   Status On-going     OT LONG TERM GOAL #4   Title Patient will demonstrate the ability to open containers with sealed tops such as on yogurt containers and individual ice cream containers.  Baseline continues to have increased difficulty with sealed, peel away container tops and requires assistance or modifications to open at times   Time 12   Period Weeks   Status On-going     OT LONG TERM GOAL #5   Title Patient will demonstrate the ability to perform small buttons on shirts independently and with good speed in order to dress for school.   Baseline getting easier but if rushed she often does the wrong button holes and will have to have mom to redo.    Time 12   Period Weeks   Status Partially Met     OT LONG TERM GOAL #6   Title Patient will donn and doff small earrings with modified independence.   Baseline mod assist from mom, patient has difficulty holding and dropping with attempts, longer earring are easier.  Difficulty with small stud earrings.    Time 12   Period Weeks   Status Partially Met     OT LONG TERM GOAL #7   Title Patient  will be able to zip her rainboots while they are on her feet with modified independence.     Baseline can operate zipper when not on feet but has difficulty when putting boots on to zip them, continues to require moderate assistance   Time 12   Period Weeks   Status On-going     OT LONG TERM GOAL #8   Title Patient to complete am self care demonstrating efficient methods of dressing skills to get to school on time.    Baseline often has to have her mom help her to get dressed to get to school on time, has been late 1 time in the last week due to slowness in getting ready   Time 12   Period Weeks   Status Partially Met     OT LONG TERM GOAL  #9   Baseline Patient will be able to braid her own hair before college   Time 6   Period Months   Status Achieved     OT LONG TERM GOAL  #10   Time 6     OT LONG TERM GOAL  #11   TITLE Will be able to squeeze tooth paste out of the tube when it is less than half full.   Baseline difficulty with small travel sized, may look into alternative ways since this has been a chronic issue.   Time 6   Period Months   Status On-going     OT LONG TERM GOAL  #12   TITLE Patient will demonstrate managing wheelchair rain poncho in inclement weather with modified independence.   Baseline crutch tips are difficult for anyone to take them off, goal deferred.   Time 6   Period Months   Status Achieved     OT LONG TERM GOAL  #13   TITLE Patient will be able to complete 2 1/2 minutes straight with left shoulder flexion simulating driving with a stearing wheel ball to be able to drive to college.    Baseline Patient has not been driving, her family takes her to school and drops her off.    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    Baseline difficulty coordinating the use of  both  hands to complete task.      OT LONG TERM GOAL  #15   TITLE Patient will be able to obtain food tray in the cafeteria from a wheelchair level and take to the table without spilling with modified independence.    Baseline max assist   Time 6   Period Months   Status Achieved               Plan - 05/11/17 1046    Clinical Impression Statement Patient having difficulty with scooping ice cream at home and has to have her mom do it for her.  Added goal to plan and focused this date on task and working towards strength and movements needed to complete task.  Continuing to work towards improved speed, dexterity and hand function to complete tasks at home, work and school.    Rehab Potential Good   OT Frequency 1x / week   OT Duration 12 weeks   OT Treatment/Interventions Self-care/ADL training;Therapeutic exercise;Therapeutic exercises;Therapeutic activities;DME and/or AE instruction;Patient/family education   Consulted and Agree with Plan of Care Patient      Patient will benefit from skilled therapeutic intervention in order to improve the following deficits and impairments:  Impaired UE functional use, Difficulty walking, Decreased strength, Decreased coordination, Decreased endurance, Decreased balance  Visit Diagnosis: Muscle weakness (generalized)  Other lack of coordination    Problem List There are no active problems to display for this patient.  Achilles Dunk, OTR/L, CLT  Jasson Siegmann 05/11/2017, 10:48 AM  [place] MAIN [facility] SERVICES [place], [place], [address] Phone: [phone]   Fax:  [phone]  Name: Michelle Randall MRN: [id number] Date of Birth: 1996/09/23

## 2017-05-11 NOTE — Therapy (Signed)
[facility] MAIN [facility] SERVICES [address] [place], [place], [address] Phone: [phone]   Fax:  [phone]  Physical Therapy Treatment  Patient Details  Name: Michelle Randall MRN: [id number] Date of Birth: 1996-05-28 No Data Recorded  Encounter Date: 05/11/2017      PT End of Session - 05/11/17 1103    Visit Number 89   Number of Visits 98   Date for PT Re-Evaluation 07/12/17   Authorization Type medicaid 2/12   Authorization Time Period 12 visits 6/20-9/11/18   PT Start Time 1035   PT Stop Time 1115   PT Time Calculation (min) 40 min   Equipment Utilized During Treatment Gait belt   Activity Tolerance Patient tolerated treatment well   Behavior During Therapy [facility] for tasks assessed/performed      History reviewed. No pertinent past medical history.  History reviewed. No pertinent surgical history.  There were no vitals filed for this visit.      Subjective Assessment - 05/11/17 1103    Subjective Patient continues to be doing fine today.    Patient is accompained by: Family member   Limitations Walking   Patient Stated Goals Patient wants to improve her core strength.    Currently in Pain? No/denies   Pain Score 0-No pain   Pain Onset In the past 7 days      Therapeutic exercise: Prone on elbows with UE fwd punch and 2 lbs x 10 x 2 Quadriped on hands and knees with UE extension x 10 with 5 sec hold x 10 Tall kneeling with support of theraball and UE flexion overhead Tall kneeling with support of theraball and UE flexion overhead, D1 and D2 x 10  Prone knee flex and hip stretch x 30 sec x 3  BLE Leg press x 20 x 3 , 100 lbs  CGA and Min to mod verbal cues used throughout with increased in postural sway and LOB most seen with narrow base of support and while on uneven surfaces. Continues to have balance deficits typical with diagnosis. Patient performs intermediate level exercises without pain behaviors and needs  verbal cuing for postural alignment and head positioning                          PT Education - 05/11/17 1103    Education provided Yes   Education Details safety with RUE elbow hyper extending   Person(s) Educated Patient   Methods Explanation;Demonstration;Tactile cues   Comprehension Verbalized understanding;Returned demonstration;Verbal cues required             PT Long Term Goals - 04/25/17 1508      PT LONG TERM GOAL #1   Title Patient will improve Dynamic Gait Index (DGI) score to > 19/24 for low falls risk regarding dynamic walking tasks    Baseline 14/24; 11/02/16: 15/24 01/28/17: 18/24 6/5: 19/24   Time 12   Period Weeks   Status Achieved     PT LONG TERM GOAL #2   Title Patient will increase Berg Balance score by > 6 points to demonstrate decreased fall risk during functional activities    Baseline 11/02/16: 34/56 01/28/17: 39/56 6/5: 45/56   Time 12   Period Weeks   Status Achieved     PT LONG TERM GOAL #3   Title Patient will be able to transfer in and out of a large car with Patient will be able to transfer in and out  of a high seated car with CGA    Baseline Patient requires min for transfer into large car   Time 12   Period Weeks   Status On-going     PT LONG TERM GOAL #4   Title Patient will complete a TUG test in < 12 seconds for independent mobility and decreased fall risk    Baseline 11.45   Time 12   Period Weeks   Status Achieved     PT LONG TERM GOAL #5   Title Patient will improve 6 minute walk distance by > 150 ft for improved return to functional community activities    Baseline 740 01/28/2017: [id number] 6/5: 795   Time 12   Status On-going     PT LONG TERM GOAL #6   Title Patient will improve gait speed to > 1.2 m/s with least restrictive assistive device to return to normal walking speed    Baseline .76 m/h 01/28/2017: .87   Time 12   Period Weeks   Status On-going     PT LONG TERM GOAL #7   Title Patient (< 20  years old) will complete five times sit to stand test in < 10 seconds indicating an increased LE strength and improved balance    Baseline 9.45 sec   Time 12   Period Weeks   Status Achieved     PT LONG TERM GOAL #8   Title Patient will be able to ambulate on inclines and grass independenlty with LRAD   Baseline Can ambulate over smooth grass but unable to safely amb over heavy uneven surfaces or gravel, need loftstrand curtches   Time 12   Period Weeks   Status On-going     PT LONG TERM GOAL  #9   TITLE Patient will be abe to transfer from low chair or stool wihtout UE support independently   Baseline Patient needs min asssit to transfer from a low stool   Period Weeks   Status Achieved     PT LONG TERM GOAL  #10   TITLE Patient will be able to transfer from the floor to standing independently with use of LRAD   Baseline Patient needs min assist to transfer from the floor to standing   Time 12   Period Weeks   Status On-going     PT LONG TERM GOAL  #11   TITLE Patient will step up onto a 6" step 5x with AD independently to increase community mobility   Baseline Pt. has difficulty stepping up with secondary leg and experiences LOB at this time affecting ability to negotiate curbs   Time 12   Period Weeks   Status New               Plan - 05/11/17 1105    Clinical Impression Statement Focused on advancement of sitting exercises and performing more exercises in standing today to improve LE strength and improving endurance. Patient demonstrates increased postural sway and LOB posteriorly requiring UEs to maintain balance and VCs to shift weight forward. Patient will benefit from further skilled therapy focused on improving strength and balance to return to prior level of function   Rehab Potential Good   Clinical Impairments Affecting Rehab Potential weakness and decreased standing balance   PT Frequency 1x / week   PT Duration 12 weeks   PT Treatment/Interventions  Therapeutic exercise;Therapeutic activities;Gait training;Balance training;Stair training;DME Instruction;Neuromuscular re-education;Patient/family education   PT Next Visit Plan plank, stairs, getting up from floor   PT  Home Exercise Plan Continued from previous sessions.    Consulted and Agree with Plan of Care Patient      Patient will benefit from skilled therapeutic intervention in order to improve the following deficits and impairments:  Abnormal gait, Decreased balance, Decreased endurance, Difficulty walking, Decreased strength  Visit Diagnosis: Muscle weakness (generalized)  Other lack of coordination  Difficulty in walking, not elsewhere classified  Lack of coordination     Problem List There are no active problems to display for this patient.  [name], PT, DPT [place], Barkley Bruns S 05/11/2017, 11:06 AM  [place] [facility] MAIN [facility] SERVICES [address] [place], [place], [address] Phone: [phone]   Fax:  [phone]  Name: ARIETTA EISENSTEIN MRN: [id number] Date of Birth: [birth year]

## 2017-05-17 ENCOUNTER — Encounter: Payer: Self-pay | Admitting: Occupational Therapy

## 2017-05-17 ENCOUNTER — Ambulatory Visit: Payer: Medicaid Other | Attending: Pediatrics | Admitting: Physical Therapy

## 2017-05-17 ENCOUNTER — Encounter: Payer: Self-pay | Admitting: Physical Therapy

## 2017-05-17 ENCOUNTER — Ambulatory Visit: Payer: Medicaid Other | Admitting: Occupational Therapy

## 2017-05-17 DIAGNOSIS — M6281 Muscle weakness (generalized): Secondary | ICD-10-CM

## 2017-05-17 DIAGNOSIS — R278 Other lack of coordination: Secondary | ICD-10-CM | POA: Diagnosis present

## 2017-05-17 DIAGNOSIS — R262 Difficulty in walking, not elsewhere classified: Secondary | ICD-10-CM | POA: Diagnosis present

## 2017-05-17 NOTE — Therapy (Signed)
[place] [facility] MAIN [facility] SERVICES [address] [place], [place], [address] Phone: [phone]   Fax:  [phone]  Physical Therapy Treatment  Patient Details  Name: Michelle Randall MRN: [id number] Date of Birth: 08-26-1996 No Data Recorded  Encounter Date: 05/17/2017      PT End of Session - 05/17/17 0931    Visit Number 90   Number of Visits 98   Date for PT Re-Evaluation 07/12/17   Authorization Type medicaid 3/12   Authorization Time Period 12 visits 6/20-9/11/18   PT Start Time 0930   PT Stop Time 1015   PT Time Calculation (min) 45 min   Equipment Utilized During Treatment Gait belt   Activity Tolerance Patient tolerated treatment well   Behavior During Therapy [facility] for tasks assessed/performed      History reviewed. No pertinent past medical history.  History reviewed. No pertinent surgical history.  There were no vitals filed for this visit.      Subjective Assessment - 05/17/17 0928    Subjective Patient says nothing is bothering her and is experiencing no pain today   Patient is accompained by: Family member   Limitations Walking   Patient Stated Goals Patient wants to improve her core strength.    Currently in Pain? No/denies   Pain Score 0-No pain   Pain Onset In the past 7 days        Treatment: Bridges 40x Side planks 3 x 1 minute on left  Side planks 2 x 30 sec and 1 x 50 sec on right  Quadruped alternating UE reach 20x each with 5 sec hold.  Tall kneeling with blue swiss ball weight shifting 15 x each side Tall kneeling bilateral shoulder elevation with 1kg ball 10x. Patient shows moderate difficulty with raising hands overhead due to losing balance Tall kneeling upward rotation with 1kg ball 10x each. Patient was able keep balance easier than with overhead exercise  Prone passive knee flexion 3 x 30 sec each side Prone on elbows alternating punches with 2lbs dumbbells 15 x each  Leg Press 100lbs 3 x 20   Standing hip abductions at // bars 15x each. Patient displays significant contralateral trunk lean and UE assist during hip abduction  Overall minimal-to-no verbal cuing necessary for proper form                           PT Education - 05/17/17 0929    Education provided Yes   Education Details Importance of strength training and proper techniques using forearm crutches to improve independence   Person(s) Educated Patient   Methods Explanation   Comprehension Verbalized understanding             PT Long Term Goals - 04/25/17 1508      PT LONG TERM GOAL #1   Title Patient will improve Dynamic Gait Index (DGI) score to > 19/24 for low falls risk regarding dynamic walking tasks    Baseline 14/24; 11/02/16: 15/24 01/28/17: 18/24 6/5: 19/24   Time 12   Period Weeks   Status Achieved     PT LONG TERM GOAL #2   Title Patient will increase Berg Balance score by > 6 points to demonstrate decreased fall risk during functional activities    Baseline 11/02/16: 34/56 01/28/17: 39/56 6/5: 45/56   Time 12   Period Weeks   Status Achieved     PT LONG TERM GOAL #3   Title  Patient will be able to transfer in and out of a large car with Patient will be able to transfer in and out of a high seated car with CGA    Baseline Patient requires min for transfer into large car   Time 12   Period Weeks   Status On-going     PT LONG TERM GOAL #4   Title Patient will complete a TUG test in < 12 seconds for independent mobility and decreased fall risk    Baseline 11.45   Time 12   Period Weeks   Status Achieved     PT LONG TERM GOAL #5   Title Patient will improve 6 minute walk distance by > 150 ft for improved return to functional community activities    Baseline 740 01/28/2017: 723ft 6/5: 795   Time 12   Status On-going     PT LONG TERM GOAL #6   Title Patient will improve gait speed to > 1.2 m/s with least restrictive assistive device to return to normal walking speed     Baseline .76 m/h 01/28/2017: .87   Time 12   Period Weeks   Status On-going     PT LONG TERM GOAL #7   Title Patient (< 68 years old) will complete five times sit to stand test in < 10 seconds indicating an increased LE strength and improved balance    Baseline 9.45 sec   Time 12   Period Weeks   Status Achieved     PT LONG TERM GOAL #8   Title Patient will be able to ambulate on inclines and grass independenlty with LRAD   Baseline Can ambulate over smooth grass but unable to safely amb over heavy uneven surfaces or gravel, need loftstrand curtches   Time 12   Period Weeks   Status On-going     PT LONG TERM GOAL  #9   TITLE Patient will be abe to transfer from low chair or stool wihtout UE support independently   Baseline Patient needs min asssit to transfer from a low stool   Period Weeks   Status Achieved     PT LONG TERM GOAL  #10   TITLE Patient will be able to transfer from the floor to standing independently with use of LRAD   Baseline Patient needs min assist to transfer from the floor to standing   Time 12   Period Weeks   Status On-going     PT LONG TERM GOAL  #11   TITLE Patient will step up onto a 6" step 5x with AD independently to increase community mobility   Baseline Pt. has difficulty stepping up with secondary leg and experiences LOB at this time affecting ability to negotiate curbs   Time 12   Period Weeks   Status New               Plan - 05/17/17 1017    Clinical Impression Statement Focused on advancement of sitting exercises and performing more exercises in standing today to improve LE strength and improving endurance. Patient demonstrates increased postural sway and LOB posteriorly requiring UEs to maintain balance. Patient needs minimal assistance to get lower extremities into proper positions for leg press and quadruped exercises.    Rehab Potential Good   Clinical Impairments Affecting Rehab Potential weakness and decreased standing  balance   PT Frequency 1x / week   PT Duration 12 weeks   PT Treatment/Interventions Therapeutic exercise;Therapeutic activities;Gait training;Balance training;Stair training;DME Instruction;Neuromuscular re-education;Patient/family education  PT Next Visit Plan plank, stairs, getting up from floor   PT Home Exercise Plan Continued from previous sessions.    Consulted and Agree with Plan of Care Patient      Patient will benefit from skilled therapeutic intervention in order to improve the following deficits and impairments:  Abnormal gait, Decreased balance, Decreased endurance, Difficulty walking, Decreased strength  Visit Diagnosis: Muscle weakness (generalized)  Difficulty in walking, not elsewhere classified     Problem List There are no active problems to display for this patient.  Ezekiel InaKristine S Aleesia Henney, PT, DPT [place], Barkley BrunsKristine S 05/17/2017, 10:26 AM  [place] [facility] [address] [place], [place], [address] Phone: [phone]   Fax:  [phone]  Name: Julaine HuaMisbah N Britz MRN: [id number] Date of Birth: Jan 26, 1996

## 2017-05-21 NOTE — Therapy (Signed)
Michelle Randall [address] [place], [place], [address] Phone: [phone]   Fax:  [phone]  Occupational Therapy Treatment  Patient Details  Name: Michelle Randall MRN: [id number] Date of Birth: 07-08-1996 No Data Recorded  Encounter Date: 05/17/2017      OT End of Session - 05/21/17 1933    Visit Number 82   Number of Visits 94   Date for OT Re-Evaluation 06/10/17   Authorization Type medicaid visit 64 of 56   OT Start Time 1015   OT Stop Time 1100   OT Time Calculation (min) 45 min   Activity Tolerance Patient tolerated treatment well   Behavior During Therapy [facility] for tasks assessed/performed      History reviewed. No pertinent past medical history.  History reviewed. No pertinent surgical history.  There were no vitals filed for this visit.      Subjective Assessment - 05/21/17 1922    Subjective  Patient reports she did a little better with ice cream this past week, focused on getting the scoop pushed down first.  May go to her sisters' in [place] for 4th of July.     Patient Stated Goals To be as independent as possible at home and at school.    Currently in Pain? No/denies   Pain Score 0-No pain                      OT Treatments/Exercises (OP) - 05/21/17 1923      Fine Motor Coordination   Other Fine Motor Exercises --     Neurological Re-education Exercises   Other Exercises 1 Patient seen for strengthening this date resistive putty green with 2 containers for gripping, pinch pulling, twisting, supination, lateral pinch, 3 point and 2 point pinch.  Wrist extension with putty with arm over wedge.  All exercises performed for 10-15 repetitions for 2 sets each.  Cues provided for technique.                  OT Education - 05/21/17 1932    Education provided Yes   Education Details theraputty exercises.   Person(s) Educated Patient   Methods Explanation;Demonstration;Verbal cues    Comprehension Verbal cues required;Returned demonstration;Verbalized understanding             OT Long Term Goals - 05/11/17 3474      OT LONG TERM GOAL #1   Title Patient will demonstrate increased strength in right hand sufficient to scoop ice cream from frozen container with modified independence.   Baseline unable to scoop ice cream and mom has to provide total assist.    Time 12   Period Months   Status New     OT LONG TERM GOAL #2   Title Patient will be able to make her bed with modified independence.   Baseline she continues to have difficulty with balance and completing this task, increased time.   Time 6   Period Months   Status On-going     OT LONG TERM GOAL #3   Title Patient will demonstrate the ability to perform vacuuming of one room with modified independence using light weight vacuum.   Baseline still requires max assist   Time 6   Period Months   Status On-going     OT LONG TERM GOAL #4   Title Patient will demonstrate the ability to open containers with sealed tops such as on yogurt containers and individual ice  cream containers.    Baseline continues to have increased difficulty with sealed, peel away container tops and requires assistance or modifications to open at times   Time 12   Period Weeks   Status On-going     OT LONG TERM GOAL #5   Title Patient will demonstrate the ability to perform small buttons on shirts independently and with good speed in order to dress for school.   Baseline getting easier but if rushed she often does the wrong button holes and will have to have mom to redo.    Time 12   Period Weeks   Status Partially Met     OT LONG TERM GOAL #6   Title Patient will donn and doff small earrings with modified independence.   Baseline mod assist from mom, patient has difficulty holding and dropping with attempts, longer earring are easier.  Difficulty with small stud earrings.    Time 12   Period Weeks   Status Partially Met      OT LONG TERM GOAL #7   Title Patient will be able to zip her rainboots while they are on her feet with modified independence.     Baseline can operate zipper when not on feet but has difficulty when putting boots on to zip them, continues to require moderate assistance   Time 12   Period Weeks   Status On-going     OT LONG TERM GOAL #8   Title Patient to complete am self care demonstrating efficient methods of dressing skills to get to school on time.    Baseline often has to have her mom help her to get dressed to get to school on time, has been late 1 time in the last week due to slowness in getting ready   Time 12   Period Weeks   Status Partially Met     OT LONG TERM GOAL  #9   Baseline Patient will be able to braid her own hair before college   Time 6   Period Months   Status Achieved     OT LONG TERM GOAL  #10   Time 6     OT LONG TERM GOAL  #11   TITLE Will be able to squeeze tooth paste out of the tube when it is less than half full.   Baseline difficulty with small travel sized, may look into alternative ways since this has been a chronic issue.   Time 6   Period Months   Status On-going     OT LONG TERM GOAL  #12   TITLE Patient will demonstrate managing wheelchair rain poncho in inclement weather with modified independence.   Baseline crutch tips are difficult for anyone to take them off, goal deferred.   Time 6   Period Months   Status Achieved     OT LONG TERM GOAL  #13   TITLE Patient will be able to complete 2 1/2 minutes straight with left shoulder flexion simulating driving with a stearing wheel ball to be able to drive to college.    Baseline Patient has not been driving, her family takes her to school and drops her off.    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  Baseline difficulty coordinating the use of both hands to complete task.      OT LONG TERM GOAL  #15   TITLE Patient will be able to obtain food tray in the cafeteria from a wheelchair level and take to the table without spilling with modified independence.    Baseline max assist   Time 6   Period Months   Status Achieved               Plan - 05/21/17 1934    Clinical Impression Statement Patient focusing on hand and wrist strength this date to perform tasks at school, work and home.  She reports being able to place the scoop in ice cream better this week and be able to serve herself without as much assistance.  She continues to benefit from skilled OT to increase her independence in daily tasks.    Rehab Potential Good   OT Frequency 1x / week   OT Duration 12 weeks   OT Treatment/Interventions Self-care/ADL training;Therapeutic exercise;Therapeutic exercises;Therapeutic activities;DME and/or AE instruction;Patient/family education   Consulted and Agree with Plan of Care Patient      Patient will benefit from skilled therapeutic intervention in order to improve the following deficits and impairments:  Impaired UE functional use, Difficulty walking, Decreased strength, Decreased coordination, Decreased endurance, Decreased balance  Visit Diagnosis: Muscle weakness (generalized)  Other lack of coordination    Problem List There are no active problems to display for this patient.  Achilles Dunk, OTR/L, CLT  Lovett,Amy 05/21/2017, 8:29 PM  [place] MAIN [facility] [address] [place], [place], [address] Phone: [phone]   Fax:  [phone]  Name: Michelle Randall MRN: [id number] Date of Birth: [birth year]

## 2017-05-24 ENCOUNTER — Ambulatory Visit: Payer: Medicaid Other | Admitting: Occupational Therapy

## 2017-05-24 ENCOUNTER — Encounter: Payer: Self-pay | Admitting: Occupational Therapy

## 2017-05-24 ENCOUNTER — Encounter: Payer: Self-pay | Admitting: Physical Therapy

## 2017-05-24 ENCOUNTER — Ambulatory Visit: Payer: Medicaid Other | Admitting: Physical Therapy

## 2017-05-24 DIAGNOSIS — R262 Difficulty in walking, not elsewhere classified: Secondary | ICD-10-CM

## 2017-05-24 DIAGNOSIS — M6281 Muscle weakness (generalized): Secondary | ICD-10-CM | POA: Diagnosis not present

## 2017-05-24 DIAGNOSIS — R278 Other lack of coordination: Secondary | ICD-10-CM

## 2017-05-24 NOTE — Therapy (Signed)
[facility] MAIN [facility] SERVICES [address] [place], [place], [address] Phone: [phone]   Fax:  [phone]  Physical Therapy Treatment  Patient Details  Name: Michelle Randall MRN: [id number] Date of Birth: 1996-05-04 No Data Recorded  Encounter Date: 05/24/2017      PT End of Session - 05/24/17 0939    Visit Number 91   Number of Visits 98   Date for PT Re-Evaluation 07/12/17   Authorization Type 4/12   Authorization Time Period 12 visits 6/20-9/11/18   PT Start Time 0915   PT Stop Time 1000   PT Time Calculation (min) 45 min   Activity Tolerance Patient tolerated treatment well   Behavior During Therapy [facility] for tasks assessed/performed      History reviewed. No pertinent past medical history.  History reviewed. No pertinent surgical history.  There were no vitals filed for this visit.      Subjective Assessment - 05/24/17 0922    Subjective Patient is doing well today, feels good and stated "this is the most exercise I have ever done".    Limitations Walking   Currently in Pain? No/denies   Pain Score 0-No pain   Multiple Pain Sites No         Treatment:  Nu Step warm up 5 minutes  Contract-relax prone quads 3 x 30 seconds each  Ball prayers grey swiss 15 x 20 seconds. Patient demonstrates good core control and proper scap stabilization after moderate cueing Forearm planks 5 x 20 seconds Kneeling push ups 3 x 8. Moderate verbal and tactile cues to reduce flaring of elbows during descend.   Nu Step cool down 5 minutes   Overall good muscular fatigue today. Patient educated on muscle soreness being normal.                          PT Education - 05/24/17 0923    Education provided Yes   Education Details Increase strength of core and scapular stabilizers to improve endurance and ability to get off floor   Person(s) Educated Patient   Methods Demonstration;Explanation   Comprehension  Verbalized understanding             PT Long Term Goals - 04/25/17 1508      PT LONG TERM GOAL #1   Title Patient will improve Dynamic Gait Index (DGI) score to > 19/24 for low falls risk regarding dynamic walking tasks    Baseline 14/24; 11/02/16: 15/24 01/28/17: 18/24 6/5: 19/24   Time 12   Period Weeks   Status Achieved     PT LONG TERM GOAL #2   Title Patient will increase Berg Balance score by > 6 points to demonstrate decreased fall risk during functional activities    Baseline 11/02/16: 34/56 01/28/17: 39/56 6/5: 45/56   Time 12   Period Weeks   Status Achieved     PT LONG TERM GOAL #3   Title Patient will be able to transfer in and out of a large car with Patient will be able to transfer in and out of a high seated car with CGA    Baseline Patient requires min for transfer into large car   Time 12   Period Weeks   Status On-going     PT LONG TERM GOAL #4   Title Patient will complete a TUG test in < 12 seconds for independent mobility and decreased fall risk    Baseline  11.45   Time 12   Period Weeks   Status Achieved     PT LONG TERM GOAL #5   Title Patient will improve 6 minute walk distance by > 150 ft for improved return to functional community activities    Baseline 740 01/28/2017: [id number] 6/5: 795   Time 12   Status On-going     PT LONG TERM GOAL #6   Title Patient will improve gait speed to > 1.2 m/s with least restrictive assistive device to return to normal walking speed    Baseline .76 m/h 01/28/2017: .87   Time 12   Period Weeks   Status On-going     PT LONG TERM GOAL #7   Title Patient (< 20 years old) will complete five times sit to stand test in < 10 seconds indicating an increased LE strength and improved balance    Baseline 9.45 sec   Time 12   Period Weeks   Status Achieved     PT LONG TERM GOAL #8   Title Patient will be able to ambulate on inclines and grass independenlty with LRAD   Baseline Can ambulate over smooth grass but unable  to safely amb over heavy uneven surfaces or gravel, need loftstrand curtches   Time 12   Period Weeks   Status On-going     PT LONG TERM GOAL  #9   TITLE Patient will be abe to transfer from low chair or stool wihtout UE support independently   Baseline Patient needs min asssit to transfer from a low stool   Period Weeks   Status Achieved     PT LONG TERM GOAL  #10   TITLE Patient will be able to transfer from the floor to standing independently with use of LRAD   Baseline Patient needs min assist to transfer from the floor to standing   Time 12   Period Weeks   Status On-going     PT LONG TERM GOAL  #11   TITLE Patient will step up onto a 6" step 5x with AD independently to increase community mobility   Baseline Pt. has difficulty stepping up with secondary leg and experiences LOB at this time affecting ability to negotiate curbs   Time 12   Period Weeks   Status New               Plan - 05/24/17 0941    Clinical Impression Statement Patient tolerated treatment very well. Patient still demonstrates postural sway during ambulation with forearm crutches.  Patient presents with tight rectus femoris bilaterally and decreased knee flexion that mildly decreased after contract-relax stretching technique. Patient needed moderate verbal and tactile cues for proper UE mechanics during strengthening exercises. Patient will benefit from skilled physical therapy to improve endurance and decrease risk for falls   Clinical Presentation Stable   Clinical Decision Making Low   Clinical Impairments Affecting Rehab Potential weakness and decreased standing balance   PT Frequency 1x / week   PT Duration 12 weeks   PT Treatment/Interventions Therapeutic exercise;Therapeutic activities;Gait training;Balance training;Stair training;DME Instruction;Neuromuscular re-education;Patient/family education   PT Next Visit Plan plank, stairs, getting up from floor   PT Home Exercise Plan Continued from  previous sessions.    Consulted and Agree with Plan of Care Patient      Patient will benefit from skilled therapeutic intervention in order to improve the following deficits and impairments:  Abnormal gait, Decreased balance, Decreased endurance, Difficulty walking, Decreased strength  Visit Diagnosis: Muscle  weakness (generalized)  Difficulty in walking, not elsewhere classified     Problem List There are no active problems to display for this patient.  Madilyn Hook SPT  This entire session was performed under direct supervision and direction of a licensed Estate agent . I have personally read, edited and approve of the note as written. [address] [place] DPT 05/24/2017, 11:43 AM  [place] [facility] MAIN [facility] SERVICES [address] [place], [place], [address] Phone: [phone]   Fax:  [phone]  Name: Michelle Randall MRN: [id number] Date of Birth: 11/28/1995

## 2017-05-30 ENCOUNTER — Encounter: Payer: Self-pay | Admitting: Occupational Therapy

## 2017-05-30 NOTE — Therapy (Signed)
[place] MAIN [facility] SERVICES [address] [place], [place], [address] Phone: [phone]   Fax:  [phone]  Occupational Therapy Treatment  Patient Details  Name: Michelle Randall MRN: [id number] Date of Birth: 20-Aug-1996 No Data Recorded  Encounter Date: 05/24/2017      OT End of Session - 05/30/17 1521    Number of Visits 94   Date for OT Re-Evaluation 06/10/17   Authorization Type medicaid visit 62 of 70   OT Start Time 1015   OT Stop Time 1101   OT Time Calculation (min) 46 min   Activity Tolerance Patient tolerated treatment well   Behavior During Therapy [facility] for tasks assessed/performed      History reviewed. No pertinent past medical history.  History reviewed. No pertinent surgical history.  There were no vitals filed for this visit.      Subjective Assessment - 05/30/17 1516    Subjective  Patient reports she is still having difficulty with getting sheets on her bed, especially fitted sheets. She would like to work on this task in the functional ability requires.   Patient Stated Goals To be as independent as possible at home and at school.    Currently in Pain? No/denies   Pain Score 0-No pain                      OT Treatments/Exercises (OP) - 05/30/17 1516      ADLs   ADL Comments Patient seen this date for focus on bed making skills incorporating balance, range of motion, coordination to complete task. Patient was instructed optimal ways to fold bed sheets for increased patient safety during bedmaking. Patient was instructed on method to complete one side of the bed, top and bottom with sheets, fitted and flat, before moving around to complete the other side of the bed. She had some difficulty with keeping the sheets in place therefore completed opposite corners method . This may work as a better option for her. Multiple trials completed with standby assist for balance.     Neurological Re-education  Exercises   Other Exercises 1 Patient also seen for finger strength and ordination with manipulation of pushpins into moderate forward. Patient required cues and modeling for technique.                OT Education - 05/30/17 1520    Education provided Yes   Education Details bed making, managing fitted and flat sheets.   Person(s) Educated Patient   Methods Explanation;Demonstration;Verbal cues   Comprehension Verbalized understanding;Returned demonstration;Verbal cues required             OT Long Term Goals - 05/24/17 1013      OT LONG TERM GOAL #1   Title Patient will demonstrate increased strength in right hand sufficient to scoop ice cream from frozen container with modified independence.   Baseline unable to scoop ice cream and mom has to provide total assist.    Time 12   Period Months   Status On-going     OT LONG TERM GOAL #2   Title Patient will be able to make her bed with modified independence.   Baseline she continues to have difficulty with balance and completing this task, increased time.   Time 6   Period Months   Status On-going     OT LONG TERM GOAL #3   Title Patient will demonstrate the ability to perform vacuuming of one room with  modified independence using light weight vacuum.   Baseline still requires max assist   Time 6   Period Months   Status On-going     OT LONG TERM GOAL #4   Title Patient will demonstrate the ability to open containers with sealed tops such as on yogurt containers and individual ice cream containers.    Baseline continues to have increased difficulty with sealed, peel away container tops and requires assistance or modifications to open at times   Time 12   Period Weeks   Status On-going     OT LONG TERM GOAL #5   Title Patient will demonstrate the ability to perform small buttons on shirts independently and with good speed in order to dress for school.   Baseline getting easier but if rushed she often does the  wrong button holes and will have to have mom to redo.    Time 12   Period Weeks   Status Partially Met     OT LONG TERM GOAL #6   Title Patient will donn and doff small earrings with modified independence.   Baseline mod assist from mom, patient has difficulty holding and dropping with attempts, longer earring are easier.  Difficulty with small stud earrings.    Time 12   Period Weeks   Status Partially Met     OT LONG TERM GOAL #7   Title Patient will be able to zip her rainboots while they are on her feet with modified independence.     Baseline can operate zipper when not on feet but has difficulty when putting boots on to zip them, continues to require moderate assistance   Time 12   Period Weeks   Status On-going     OT LONG TERM GOAL #8   Title Patient to complete am self care demonstrating efficient methods of dressing skills to get to school on time.    Baseline often has to have her mom help her to get dressed to get to school on time, has been late 1 time in the last week due to slowness in getting ready   Time 12   Period Weeks   Status Partially Met     OT LONG TERM GOAL  #9   Baseline Patient will be able to braid her own hair before college   Time 6   Period Months   Status Achieved     OT LONG TERM GOAL  #10   Time 6     OT LONG TERM GOAL  #11   TITLE Will be able to squeeze tooth paste out of the tube when it is less than half full.   Baseline difficulty with small travel sized, may look into alternative ways since this has been a chronic issue.   Time 6   Period Months   Status Achieved     OT LONG TERM GOAL  #12   TITLE Patient will demonstrate managing wheelchair rain poncho in inclement weather with modified independence.   Baseline crutch tips are difficult for anyone to take them off, goal deferred.   Time 6   Period Months   Status Achieved     OT LONG TERM GOAL  #13   TITLE Patient will be able to complete 2 1/2 minutes straight with left  shoulder flexion simulating driving with a stearing wheel ball to be able to drive to college.    Baseline Patient has not been driving, her family takes her to school and drops her  off.    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    Baseline difficulty coordinating the use of both hands to complete task.      OT LONG TERM GOAL  #15   TITLE Patient will be able to obtain food tray in the cafeteria from a wheelchair level and take to the table without spilling with modified independence.    Baseline max assist   Time 6   Period Months   Status Achieved               Plan - 05/30/17 1520    Clinical Impression Statement Patient continues to demonstrate difficulty with bed making skills, particularly with managing fitted sheet. After several trials, the best method might be a opposite corner method for the fitted sheet to be maintained. Her finger strength is improving and she is able to push 75% of the pins into the board fully versus only being able to push them in halfway during previous sessions. She continues to progress in all areas.   Rehab Potential Good   OT Frequency 1x / week   OT Duration 12 weeks   OT Treatment/Interventions Self-care/ADL training;Therapeutic exercise;Therapeutic exercises;Therapeutic activities;DME and/or AE instruction;Patient/family education   Consulted and Agree with Plan of Care Patient      Patient will benefit from skilled therapeutic intervention in order to improve the following deficits and impairments:  Impaired UE functional use, Difficulty walking, Decreased strength, Decreased coordination, Decreased endurance, Decreased balance  Visit Diagnosis: Muscle weakness (generalized)  Other lack of coordination    Problem List There are no active problems to  display for this patient.  Achilles Dunk, OTR/L, CLT  Mong Neal 05/30/2017, 3:23 PM  [place] MAIN [facility] SERVICES [address] [place], [place], [address] Phone: [phone]   Fax:  [phone]  Name: Michelle Randall MRN: [id number] Date of Birth: 1995-11-23

## 2017-05-31 ENCOUNTER — Ambulatory Visit: Payer: Medicaid Other | Admitting: Occupational Therapy

## 2017-05-31 ENCOUNTER — Ambulatory Visit: Payer: Medicaid Other | Admitting: Physical Therapy

## 2017-06-02 ENCOUNTER — Ambulatory Visit: Payer: Medicaid Other | Admitting: Occupational Therapy

## 2017-06-02 ENCOUNTER — Ambulatory Visit: Payer: Medicaid Other | Admitting: Physical Therapy

## 2017-06-07 ENCOUNTER — Ambulatory Visit: Payer: Medicaid Other | Admitting: Occupational Therapy

## 2017-06-07 ENCOUNTER — Ambulatory Visit: Payer: Medicaid Other | Admitting: Physical Therapy

## 2017-06-07 ENCOUNTER — Encounter: Payer: Self-pay | Admitting: Physical Therapy

## 2017-06-07 VITALS — BP 105/68 | HR 86

## 2017-06-07 DIAGNOSIS — M6281 Muscle weakness (generalized): Secondary | ICD-10-CM | POA: Diagnosis not present

## 2017-06-07 DIAGNOSIS — R262 Difficulty in walking, not elsewhere classified: Secondary | ICD-10-CM

## 2017-06-07 DIAGNOSIS — R278 Other lack of coordination: Secondary | ICD-10-CM

## 2017-06-07 NOTE — Therapy (Signed)
[place] [facility] MAIN [facility] SERVICES [address] , [place], [address] Phone: [phone]   Fax:  [phone]  Physical Therapy Treatment  Patient Details  Name: Michelle Randall MRN: [id number] Date of Birth: March 10, 1996 No Data Recorded  Encounter Date: 06/07/2017      PT End of Session - 06/07/17 0925    Visit Number 92   Number of Visits 98   Date for PT Re-Evaluation 07/12/17   Authorization Type 5/12   Authorization Time Period 12 visits 6/20-9/11/18   PT Start Time 0915   PT Stop Time 1000   PT Time Calculation (min) 45 min   Activity Tolerance Patient tolerated treatment well   Behavior [facility] Therapy [facility] for tasks assessed/performed      History reviewed. No pertinent past medical history.  History reviewed. No pertinent surgical history.  Vitals:   06/07/17 0919  BP: 105/68  Pulse: 86  SpO2: 100%        Subjective Assessment - 06/07/17 0920    Subjective Patient has been recovering from allergic reaction to antiebiotic medication over the past week. Patient describes feeling very weak and is low on energy.    Limitations Walking   Patient Stated Goals Patient wants to improve her core strength.    Currently in Pain? No/denies   Pain Score 0-No pain   Multiple Pain Sites No       Checked vitals before session = WNL   Nu Step warm up 5 minutes   muscle  energy technique  HS bilaterally in supine. Patient feels moderate stretch in HS. Shows good ROM here muscle  energy technique quads/hip flexors in prone. Significant muscle tone felt in quads restricting ROM. Supine knee rocks. Patient displays less ROM when knees rock to R.   Prone hip extensions 10 x each with 5 second isometric hold.  Patient lacks adequate hip extension bilaterally. Mild fatigue towards end.  Ball prayers with grey swiss ball 10 x 10 second holds. Patient shows lumbar extension during this exercise which is mildly corrected with tactile  and verbal cues. Cat/cow 10x each. Patient shows more ROM with lumbar extension and anterior pelvic tilt. Patient improved with practice.                          PT Education - 06/07/17 [phone]    Education provided Yes   Education Details Patient educated on taking it easy for the next couple of days until her body recovers from fever/allergic reaction.    Person(s) Educated Patient   Methods Explanation   Comprehension Verbalized understanding             PT Long Term Goals - 04/25/17 1508      PT LONG TERM GOAL #1   Title Patient will improve Dynamic Gait Index (DGI) score to > 19/24 for low falls risk regarding dynamic walking tasks    Baseline 14/24; 11/02/16: 15/24 01/28/17: 18/24 6/5: 19/24   Time 12   Period Weeks   Status Achieved     PT LONG TERM GOAL #2   Title Patient will increase Berg Balance score by > 6 points to demonstrate decreased fall risk during functional activities    Baseline 11/02/16: 34/56 01/28/17: 39/56 6/5: 45/56   Time 12   Period Weeks   Status Achieved     PT LONG TERM GOAL #3   Title Patient will be able to transfer in and out  of a large car with Patient will be able to transfer in and out of a high seated car with CGA    Baseline Patient requires min for transfer into large car   Time 12   Period Weeks   Status On-going     PT LONG TERM GOAL #4   Title Patient will complete a TUG test in < 12 seconds for independent mobility and decreased fall risk    Baseline 11.45   Time 12   Period Weeks   Status Achieved     PT LONG TERM GOAL #5   Title Patient will improve 6 minute walk distance by > 150 ft for improved return to functional community activities    Baseline 740 01/28/2017: 73ft 6/5: 795   Time 12   Status On-going     PT LONG TERM GOAL #6   Title Patient will improve gait speed to > 1.2 m/s with least restrictive assistive device to return to normal walking speed    Baseline .76 m/h 01/28/2017: .87   Time  12   Period Weeks   Status On-going     PT LONG TERM GOAL #7   Title Patient (< 84 years old) will complete five times sit to stand test in < 10 seconds indicating an increased LE strength and improved balance    Baseline 9.45 sec   Time 12   Period Weeks   Status Achieved     PT LONG TERM GOAL #8   Title Patient will be able to ambulate on inclines and grass independenlty with LRAD   Baseline Can ambulate over smooth grass but unable to safely amb over heavy uneven surfaces or gravel, need loftstrand curtches   Time 12   Period Weeks   Status On-going     PT LONG TERM GOAL  #9   TITLE Patient will be abe to transfer from low chair or stool wihtout UE support independently   Baseline Patient needs min asssit to transfer from a low stool   Period Weeks   Status Achieved     PT LONG TERM GOAL  #10   TITLE Patient will be able to transfer from the floor to standing independently with use of LRAD   Baseline Patient needs min assist to transfer from the floor to standing   Time 12   Period Weeks   Status On-going     PT LONG TERM GOAL  #11   TITLE Patient will step up onto a 6" step 5x with AD independently to increase community mobility   Baseline Pt. has difficulty stepping up with secondary leg and experiences LOB at this time affecting ability to negotiate curbs   Time 12   Period Weeks   Status New               Plan - 06/07/17 1610    Clinical Impression Statement Therapy session was taken easy today due to patient's high fatigue level from fever and rash last week.  Therapy mostly focused on PT-assisted stretching techniques to decrease muscle tone and improve ROM. Patient tolerated session well considering her fatigue level. Patient educated on self-care and continuing therapy intensity once she feels better.  Patient will continue to benefit from skilled physical therapy to improve dynamic balance and reduce risk of falls.   Clinical Impairments Affecting Rehab  Potential weakness and decreased standing balance   PT Frequency 1x / week   PT Duration 12 weeks   PT Treatment/Interventions Therapeutic exercise;Therapeutic  activities;Gait training;Balance training;Stair training;DME Instruction;Neuromuscular re-education;Patient/family education   PT Next Visit Plan continue previous therapy intensity once fatigue subsides   PT Home Exercise Plan Continued from previous sessions.    Consulted and Agree with Plan of Care Patient      Patient will benefit from skilled therapeutic intervention in order to improve the following deficits and impairments:  Abnormal gait, Decreased balance, Decreased endurance, Difficulty walking, Decreased strength  Visit Diagnosis: Muscle weakness (generalized)  Difficulty in walking, not elsewhere classified     Problem List There are no active problems to display for this patient. This entire session was performed under direct supervision and direction of a licensed therapist/therapist assistant . I have personally read, edited and approve of the note as written. Madilyn Hook SPT  [address] [place], [place], DPT 06/07/2017, 10:04 AM  [place] [facility] MAIN [facility] SERVICES [address] [place], [place], [address] Phone: [phone]   Fax:  [phone]  Name: Michelle Randall MRN: [id number] Date of Birth: 06-16-1996

## 2017-06-09 ENCOUNTER — Encounter: Payer: Self-pay | Admitting: Occupational Therapy

## 2017-06-09 NOTE — Therapy (Signed)
[place] MAIN [facility] SERVICES [address] [place], [place], [address] Phone: [phone]   Fax:  [phone]  Occupational Therapy Treatment  Patient Details  Name: Michelle Randall MRN: [id number] Date of Birth: 04/27/1996 No Data Recorded  Encounter Date: 06/07/2017      OT End of Session - 06/09/17 2128    Visit Number 2   Number of Visits 94   Date for OT Re-Evaluation 06/10/17   Authorization Type medicaid visit 40 of 7   OT Start Time 0917   OT Stop Time 1000   OT Time Calculation (min) 43 min   Activity Tolerance Patient tolerated treatment well   Behavior During Therapy [facility] for tasks assessed/performed      History reviewed. No pertinent past medical history.  History reviewed. No pertinent surgical history.  There were no vitals filed for this visit.      Subjective Assessment - 06/09/17 2119    Subjective  Patient reports she was sick last week and missed her therapy sessions.  She is feeling better now.  She had an infection and had to take antibiotics which she had a reaction to.    Patient Stated Goals To be as independent as possible at home and at school.    Currently in Pain? No/denies   Pain Score 0-No pain                      OT Treatments/Exercises (OP) - 06/09/17 0001      ADLs   ADL Comments Patient seen for self care tasks with pinch skills to squeeze for getting toothpaste from tube, discussed issues with being able to dress herself especially since she has an upcoming trip where she will travel alone without her parents.  Instructed on use of wheelchair in airport and in the city as well as dressing skills for lower body.  She has some difficulty with getting pants over braces and some recent difficulty with shoes with increased edema in her feet.       Fine Motor Coordination   Other Fine Motor Exercises Patient seen for fine motor tasks with manipulation of 1/2 inch objects from  tabletop to place into containers using hand for storage and translatory skills with verbal cues.       Neurological Re-education Exercises   Other Exercises 1 Patient seen for multi directional reaching tasks in sitting with 1# wrist weights to each arm. Cues for shifting weight to reach higher to place items in elevated plane.                  OT Education - 06/09/17 2128    Education provided Yes   Education Details strategies for traveling.   Person(s) Educated Patient   Methods Explanation;Demonstration;Verbal cues   Comprehension Verbal cues required;Returned demonstration;Verbalized understanding             OT Long Term Goals - 05/24/17 1013      OT LONG TERM GOAL #1   Title Patient will demonstrate increased strength in right hand sufficient to scoop ice cream from frozen container with modified independence.   Baseline unable to scoop ice cream and mom has to provide total assist.    Time 12   Period Months   Status On-going     OT LONG TERM GOAL #2   Title Patient will be able to make her bed with modified independence.   Baseline she continues to have difficulty  with balance and completing this task, increased time.   Time 6   Period Months   Status On-going     OT LONG TERM GOAL #3   Title Patient will demonstrate the ability to perform vacuuming of one room with modified independence using light weight vacuum.   Baseline still requires max assist   Time 6   Period Months   Status On-going     OT LONG TERM GOAL #4   Title Patient will demonstrate the ability to open containers with sealed tops such as on yogurt containers and individual ice cream containers.    Baseline continues to have increased difficulty with sealed, peel away container tops and requires assistance or modifications to open at times   Time 12   Period Weeks   Status On-going     OT LONG TERM GOAL #5   Title Patient will demonstrate the ability to perform small buttons on  shirts independently and with good speed in order to dress for school.   Baseline getting easier but if rushed she often does the wrong button holes and will have to have mom to redo.    Time 12   Period Weeks   Status Partially Met     OT LONG TERM GOAL #6   Title Patient will donn and doff small earrings with modified independence.   Baseline mod assist from mom, patient has difficulty holding and dropping with attempts, longer earring are easier.  Difficulty with small stud earrings.    Time 12   Period Weeks   Status Partially Met     OT LONG TERM GOAL #7   Title Patient will be able to zip her rainboots while they are on her feet with modified independence.     Baseline can operate zipper when not on feet but has difficulty when putting boots on to zip them, continues to require moderate assistance   Time 12   Period Weeks   Status On-going     OT LONG TERM GOAL #8   Title Patient to complete am self care demonstrating efficient methods of dressing skills to get to school on time.    Baseline often has to have her mom help her to get dressed to get to school on time, has been late 1 time in the last week due to slowness in getting ready   Time 12   Period Weeks   Status Partially Met     OT LONG TERM GOAL  #9   Baseline Patient will be able to braid her own hair before college   Time 6   Period Months   Status Achieved     OT LONG TERM GOAL  #10   Time 6     OT LONG TERM GOAL  #11   TITLE Will be able to squeeze tooth paste out of the tube when it is less than half full.   Baseline difficulty with small travel sized, may look into alternative ways since this has been a chronic issue.   Time 6   Period Months   Status Achieved     OT LONG TERM GOAL  #12   TITLE Patient will demonstrate managing wheelchair rain poncho in inclement weather with modified independence.   Baseline crutch tips are difficult for anyone to take them off, goal deferred.   Time 6   Period  Months   Status Achieved     OT LONG TERM GOAL  #13   TITLE Patient will  be able to complete 2 1/2 minutes straight with left shoulder flexion simulating driving with a stearing wheel ball to be able to drive to college.    Baseline Patient has not been driving, her family takes her to school and drops her off.    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    Baseline difficulty coordinating the use of both hands to complete task.      OT LONG TERM GOAL  #15   TITLE Patient will be able to obtain food tray in the cafeteria from a wheelchair level and take to the table without spilling with modified independence.    Baseline max assist   Time 6   Period Months   Status Achieved               Plan - 06/09/17 2129    Clinical Impression Statement Patient continues to work towards improving strength, ROM and use of bilateral UEs for daily tasks.  She is planning on going on a trip for a few nights for a school event which involves traveling alone and flying on an airplane.  She will have a roommate from school but will need to be able to complete all her self care independently.  She will have a handicapped accessible hotel room.  Patient instructed on self care tasks for traveling.  Will plan to work next session on lower body dressing managing braces and pants.     Rehab Potential Good   OT Frequency 1x / week   OT Duration 12 weeks   OT Treatment/Interventions Self-care/ADL training;Therapeutic exercise;Therapeutic exercises;Therapeutic activities;DME and/or AE instruction;Patient/family education   Consulted and Agree with Plan of Care Patient      Patient will benefit from skilled therapeutic intervention in order to improve the following deficits and impairments:  Impaired UE functional use, Difficulty  walking, Decreased strength, Decreased coordination, Decreased endurance, Decreased balance  Visit Diagnosis: Muscle weakness (generalized)  Other lack of coordination    Problem List There are no active problems to display for this patient.  Achilles Dunk, OTR/L, CLT  Lovett,Amy 06/09/2017, 9:38 PM  [place] MAIN [facility] SERVICES [address] [place], [place], [address] Phone: [phone]   Fax:  [phone]  Name: ARIANN KHAIMOV MRN: [id number] Date of Birth: 1996/06/21

## 2017-06-14 ENCOUNTER — Ambulatory Visit: Payer: Medicaid Other | Admitting: Physical Therapy

## 2017-06-14 ENCOUNTER — Ambulatory Visit: Payer: Medicaid Other | Admitting: Occupational Therapy

## 2017-06-14 ENCOUNTER — Encounter: Payer: Self-pay | Admitting: Physical Therapy

## 2017-06-14 ENCOUNTER — Encounter: Payer: Self-pay | Admitting: Occupational Therapy

## 2017-06-14 DIAGNOSIS — M6281 Muscle weakness (generalized): Secondary | ICD-10-CM

## 2017-06-14 DIAGNOSIS — R278 Other lack of coordination: Secondary | ICD-10-CM
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
## 2017-06-14 NOTE — Therapy (Signed)
[place] [facility] [address] [place], [place], [address] Phone: [phone]   Fax:  [phone]  Physical Therapy Treatment  Patient Details  Name: Michelle Randall MRN: [id number] Date of Birth: 08-Dec-1995 No Data Recorded  Encounter Date: 06/14/2017      PT End of Session - 06/14/17 0924    Visit Number 93   Number of Visits 98   Date for PT Re-Evaluation 07/12/17   Authorization Type 6/12   Authorization Time Period 12 visits 6/20-9/11/18   PT Start Time 0915   PT Stop Time 1000   PT Time Calculation (min) 45 min   Activity Tolerance Patient tolerated treatment well   Behavior [facility]/per[facility]      History reviewed. No pertinent past medical history.  History reviewed. No pertinent surgical history.  There were no vitals filed for this visit.      Subjective Assessment - 06/14/17 0921    Subjective Patient feels much better since allergic reaction last week. Patient has nothing new to report.    Limitations Walking   Currently in Pain? No/denies   Pain Score 0-No pain   Multiple Pain Sites No         Treatment:  Nu Step warm up 5 minutes   Prone contract-relax quad stretch 3 x 30 seconds.   Forearm planks and push up circuit 2x (15 second plank: 10 second push up 5x each). Patient is able to clear her knees during plank and push up. Patient is instructed to lower from top of push up position with good form. Good fatigue at end of sets.   Standing exercise at tower; patient uses crutch on one arm and the other to perform single arm exercise to challenge dynamic balance during upper extremity resisted movement:  Standing single arm rows at tower 15 x each. Patient demonstrates shoulder abduction during row that is mildly corrected with verbal and tactile cues. Standing single arm lat pull down at tower 5 lbs 15 x each. Patient requires moderate cues to reduce trunk  rotation/lean during concentric part of exercise. Standing single arm chest press at tower 2.5 lbs 15 x each.  Patient requires moderate cues to reduce trunk compensation strategy.   Patient took a 10 minute restroom break today.                        PT Education - 06/14/17 [phone]    Education provided Yes   Education Details Continuing to progress therapy now that symptoms have subsided   Person(s) Educated Patient   Methods Explanation   Comprehension Verbalized understanding             PT Long Term Goals - 04/25/17 1508      PT LONG TERM GOAL #1   Title Patient will improve Dynamic Gait Index (DGI) score to > 19/24 for low falls risk regarding dynamic walking tasks    Baseline 14/24; 11/02/16: 15/24 01/28/17: 18/24 6/5: 19/24   Time 12   Period Weeks   Status Achieved     PT LONG TERM GOAL #2   Title Patient will increase Berg Balance score by > 6 points to demonstrate decreased fall risk during functional activities    Baseline 11/02/16: 34/56 01/28/17: 39/56 6/5: 45/56   Time 12   Period Weeks   Status Achieved     PT LONG TERM GOAL #3   Title Patient will be  able to transfer in and out of a large car with Patient will be able to transfer in and out of a high seated car with CGA    Baseline Patient requires min for transfer into large car   Time 12   Period Weeks   Status On-going     PT LONG TERM GOAL #4   Title Patient will complete a TUG test in < 12 seconds for independent mobility and decreased fall risk    Baseline 11.45   Time 12   Period Weeks   Status Achieved     PT LONG TERM GOAL #5   Title Patient will improve 6 minute walk distance by > 150 ft for improved return to functional community activities    Baseline 740 01/28/2017: 772ft 6/5: 795   Time 12   Status On-going     PT LONG TERM GOAL #6   Title Patient will improve gait speed to > 1.2 m/s with least restrictive assistive device to return to normal walking speed     Baseline .76 m/h 01/28/2017: .87   Time 12   Period Weeks   Status On-going     PT LONG TERM GOAL #7   Title Patient (< 33 years old) will complete five times sit to stand test in < 10 seconds indicating an increased LE strength and improved balance    Baseline 9.45 sec   Time 12   Period Weeks   Status Achieved     PT LONG TERM GOAL #8   Title Patient will be able to ambulate on inclines and grass independenlty with LRAD   Baseline Can ambulate over smooth grass but unable to safely amb over heavy uneven surfaces or gravel, need loftstrand curtches   Time 12   Period Weeks   Status On-going     PT LONG TERM GOAL  #9   TITLE Patient will be abe to transfer from low chair or stool wihtout UE support independently   Baseline Patient needs min asssit to transfer from a low stool   Period Weeks   Status Achieved     PT LONG TERM GOAL  #10   TITLE Patient will be able to transfer from the floor to standing independently with use of LRAD   Baseline Patient needs min assist to transfer from the floor to standing   Time 12   Period Weeks   Status On-going     PT LONG TERM GOAL  #11   TITLE Patient will step up onto a 6" step 5x with AD independently to increase community mobility   Baseline Pt. has difficulty stepping up with secondary leg and experiences LOB at this time affecting ability to negotiate curbs   Time 12   Period Weeks   Status New               Plan - 06/14/17 0947    Clinical Impression Statement Patient seemed to have recovered from allergic reaction so therapy was continued as normal.  Patient's quads have increased tone that is mildly decreased with low load and long duration stretching techniques.  Patient tolerated trunk and upper extremity strengthening well with good muscle fatigue in abdominals, scapula stabilizers, and deltoid muscles. Patient was able to perform standing single arm exercises without pain or LOB.  Patient was cued to bring  stance-crutch closer to feet to reduce base of support to challenge posture control during UE movement with resistance. Patient will continue to benefit from skilled physical  therapy to improve dynamic balance and UE endurance to reduce risk of falls and increase independence with ADLs.     Clinical Impairments Affecting Rehab Potential weakness and decreased standing balance   PT Frequency 1x / week   PT Duration 12 weeks   PT Treatment/Interventions Therapeutic exercise;Therapeutic activities;Gait training;Balance training;Stair training;DME Instruction;Neuromuscular re-education;Patient/family education   PT Next Visit Plan Continue POC   PT Home Exercise Plan Continued from previous sessions.    Consulted and Agree with Plan of Care Patient      Patient will benefit from skilled therapeutic intervention in order to improve the following deficits and impairments:  Abnormal gait, Decreased balance, Decreased endurance, Difficulty walking, Decreased strength  Visit Diagnosis: Muscle weakness (generalized)  Other lack of coordination     Problem List There are no active problems to display for this patient. This entire session was performed under direct supervision and direction of a licensed therapist/therapist assistant . I have personally read, edited and approve of the note as written. Madilyn HookDouglas Nayelie Gionfriddo [address]PT  [name], Barkley BrunsKristine S 06/14/2017, 11:21 AM  Ezekiel InaKristine S [name], PT, DPT  [place] [facility] [address] [place], [place], [address] Phone: [phone]   Fax:  [phone]  Name: Michelle Randall MRN: [id number] Date of Birth: [birth year]

## 2017-06-21 ENCOUNTER — Ambulatory Visit: Payer: Medicaid Other | Admitting: Occupational Therapy

## 2017-06-21 ENCOUNTER — Encounter: Payer: Self-pay | Admitting: Physical Therapy

## 2017-06-21 ENCOUNTER — Ambulatory Visit: Payer: Medicaid Other | Attending: Pediatrics | Admitting: Physical Therapy

## 2017-06-21 ENCOUNTER — Encounter: Payer: Self-pay | Admitting: Occupational Therapy

## 2017-06-21 DIAGNOSIS — M6281 Muscle weakness (generalized): Secondary | ICD-10-CM

## 2017-06-21 DIAGNOSIS — R278 Other lack of coordination: Secondary | ICD-10-CM | POA: Diagnosis present

## 2017-06-21 DIAGNOSIS — R262 Difficulty in walking, not elsewhere classified: Secondary | ICD-10-CM

## 2017-06-21 DIAGNOSIS — R279 Unspecified lack of coordination: Secondary | ICD-10-CM | POA: Diagnosis present

## 2017-06-21 NOTE — Therapy (Signed)
[address] MAIN [facility] SERVICES [address] [place], [place], [address] Phone: [phone]   Fax:  [phone]  Physical Therapy Treatment  Patient Details  Name: Michelle Randall MRN: [id number] Date of Birth: [birth year] No Data Recorded  Encounter Date: 06/21/2017      PT End of Session - 06/21/17 0923    Visit Number 94   Number of Visits 98   Date for PT Re-Evaluation 07/12/17   Authorization Type 7/12   Authorization Time Period 12 visits 6/20-9/11/18   PT Start Time 0910   PT Stop Time 0950   PT Time Calculation (min) 40 min   Equipment Utilized During Treatment Gait belt   Activity Tolerance Patient tolerated treatment well   Behavior During Therapy [facility] for tasks assessed/performed      History reviewed. No pertinent past medical history.  History reviewed. No pertinent surgical history.  There were no vitals filed for this visit.      Subjective Assessment - 06/21/17 0921    Subjective Patient was able to go on her first trip out of state and on an airplane without her parents.   Patient is accompained by: Family member   Limitations Walking   Patient Stated Goals Patient wants to improve her core strength.    Currently in Pain? No/denies   Pain Score 0-No pain   Pain Onset In the past 7 days   Multiple Pain Sites No     Treatment: Quadriped BUE extension x 20, cues to weight shift on UE's and try to keep hips level Tall kneelling and sitting on heels and back to full extension x 20, needs cues to weight shift backwards Planks sidelying and prone x 30  Sec x 3 reps, with cues to raise up as high as possible Prone on elbows x 20 with 1 lb weight, cues to extend UE's forward Bridging x 10 x 2, cues to raise up and hold x 2 seconds, cues to clear hips from the mat Tall kneeling and side stepping with UE support on theraball , for balance and min VCS to improve trunk control for better  stance;                              PT Education - 06/21/17 0923    Education provided Yes   Education Details HEP   Person(s) Educated Patient   Methods Explanation;Demonstration   Comprehension Verbalized understanding;Returned demonstration             PT Long Term Goals - 06/21/17 0942      PT LONG TERM GOAL #1   Title Patient will improve Dynamic Gait Index (DGI) score to > 19/24 for low falls risk regarding dynamic walking tasks    Baseline 14/24; 11/02/16: 15/24 01/28/17: 18/24 6/5: 19/24   Time 12   Period Weeks   Status Achieved     PT LONG TERM GOAL #2   Title Patient will increase Berg Balance score by > 6 points to demonstrate decreased fall risk during functional activities    Baseline 11/02/16: 34/56 01/28/17: 39/56 6/5: 45/56   Time 12   Period Weeks   Status Achieved     PT LONG TERM GOAL #3   Title Patient will be able to transfer in and out of a large car with Patient will be able to transfer in and out of a high seated car with CGA  Baseline Patient requires min for transfer into large car   Time 12   Period Weeks   Status On-going     PT LONG TERM GOAL #4   Title Patient will complete a TUG test in < 12 seconds for independent mobility and decreased fall risk    Baseline 11.45   Time 12   Period Weeks   Status Achieved     PT LONG TERM GOAL #5   Title Patient will improve 6 minute walk distance by > 150 ft for improved return to functional community activities    Baseline 740 01/28/2017: 710f 6/5: 795   Time 12   Status On-going     PT LONG TERM GOAL #6   Title Patient will improve gait speed to > 1.2 m/s with least restrictive assistive device to return to normal walking speed    Baseline .76 m/h 01/28/2017: .87   Time 12   Period Weeks   Status On-going     PT LONG TERM GOAL #7   Title Patient (< 627years old) will complete five times sit to stand test in < 10 seconds indicating an increased LE strength and  improved balance    Baseline 9.45 sec   Time 12   Period Weeks   Status Achieved     PT LONG TERM GOAL #8   Title Patient will be able to ambulate on inclines and grass independenlty with LRAD   Baseline Can ambulate over smooth grass but unable to safely amb over heavy uneven surfaces or gravel, need loftstrand curtches   Time 12   Period Weeks   Status On-going     PT LONG TERM GOAL  #9   TITLE Patient will be abe to transfer from low chair or stool wihtout UE support independently   Baseline Patient needs min asssit to transfer from a low stool   Period Weeks   Status Achieved     PT LONG TERM GOAL  #10   TITLE Patient will be able to transfer from the floor to standing independently with use of LRAD   Baseline Patient needs min assist to transfer from the floor to standing   Time 12   Period Weeks   Status On-going     PT LONG TERM GOAL  #11   TITLE Patient will step up onto a 6" step 5x with AD independently to increase community mobility   Baseline Pt. has difficulty stepping up with secondary leg and experiences LOB at this time affecting ability to negotiate curbs   Time 12   Period Weeks   Status Partially Met               Plan - 06/21/17 0924    Clinical Impression Statement Patient perfroms therapeutic exericses and core strengthening wihtout rest periods and with min assist for technique and positions. Patietn will continue to benefit from skilled PT to improve gait and strength.    Clinical Impairments Affecting Rehab Potential weakness and decreased standing balance   PT Frequency 1x / week   PT Duration 12 weeks   PT Treatment/Interventions Therapeutic exercise;Therapeutic activities;Gait training;Balance training;Stair training;DME Instruction;Neuromuscular re-education;Patient/family education   PT Next Visit Plan Continue POC   PT Home Exercise Plan Continued from previous sessions.    Consulted and Agree with Plan of Care Patient      Patient  will benefit from skilled therapeutic intervention in order to improve the following deficits and impairments:  Abnormal gait, Decreased balance, Decreased  endurance, Difficulty walking, Decreased strength  Visit Diagnosis: Muscle weakness (generalized)  Other lack of coordination  Difficulty in walking, not elsewhere classified     Problem List There are no active problems to display for this patient.   [address], [place] DPT 06/21/2017, 9:52 AM  [place] MAIN [facility] SERVICES [address] [place], [place], [address] Phone: [phone]   Fax:  [phone]  Name: Michelle Randall MRN: [id number] Date of Birth: 1996-04-23

## 2017-06-27 NOTE — Therapy (Signed)
[facility] MAIN REHAB SERVICES [address] [place], [place], [address] Phone: [phone]   Fax:  [phone]  Occupational Therapy Treatment  Patient Details  Name: Michelle Randall MRN: [id number] Date of Birth: 02/23/1996 No Data Recorded  Encounter Date: 06/21/2017      OT End of Session - 06/27/17 1551    Visit Number 86   Number of Visits 106   Date for OT Re-Evaluation 09/09/17   Authorization Type medicaid visit 65 of 106   OT Start Time 1000   OT Stop Time 1046   OT Time Calculation (min) 46 min   Activity Tolerance Patient tolerated treatment well   Behavior During Therapy [facility]FL for tasks assessed/performed      History reviewed. No pertinent past medical history.  History reviewed. No pertinent surgical history.  There were no vitals filed for this visit.      Subjective Assessment - 06/26/17 1550    Subjective  Patient reports she had a great time at the conference over the weekend in [place]. She did well with the flight and getting dressed (she took stretchy clothes to fit over braces.)  She did report issues with trying to perform wheelchair mobility when on carpeted areas in the hotel and required assistance.  Arms fatigued quickly.  Made it everywhere on time and got up early to accommodate her needs.    Patient Stated Goals To be as independent as possible at home and at school.    Currently in Pain? No/denies   Pain Score 0-No pain                      OT Treatments/Exercises (OP) - 06/26/17 1555      Neurological Re-education Exercises   Other Exercises 1 Patient seen for reaching tasks with 1# wrist weights to place and remove items in elevated planes with 4 levels of height on tabletop.  Patient using right hand to perform all 4 levels and then switched hands to use left hand to complete the return back 4 levels. Patient seen for resistive sustained grip strength 17# for 25 reps for 2 sets each  hand.  Patient performing pinch tasks with all levels of resistance pins to place onto elevated surface.                  OT Education - 06/27/17 1550    Education provided Yes   Education Details home exercises   Person(s) Educated Patient   Methods Explanation;Demonstration;Verbal cues   Comprehension Verbal cues required;Returned demonstration;Verbalized understanding             OT Long Term Goals - 06/14/17 1547      OT LONG TERM GOAL #1   Title Patient will demonstrate increased strength in right hand sufficient to scoop ice cream from frozen container with modified independence.   Baseline goal update:  patient now able to complete with minimal assist at times.   Time 12   Period Months   Status On-going     OT LONG TERM GOAL #2   Title Patient will be able to make her bed with modified independence.   Baseline she continues to have difficulty with balance and completing this task, increased time, minimal assist at goal update   Time 6   Period Months   Status On-going     OT LONG TERM GOAL #3   Title Patient will demonstrate the ability to perform vacuuming of one   room with modified independence using light weight vacuum.   Baseline moderate assist at goal update.   Time 6   Period Months   Status On-going     OT LONG TERM GOAL #4   Title Patient will demonstrate the ability to open containers with sealed tops such as on yogurt containers and individual ice cream containers.    Baseline goal update:  occasional assistance required but able to complete more than 50% of the time with modified independence.    Time 12   Period Weeks   Status Partially Met     OT LONG TERM GOAL #5   Title Patient will demonstrate the ability to perform small buttons on shirts independently and with good speed in order to dress for school.   Baseline goal update: Patient can perform with increased time but occasionally needs assist if rushed.   Time 12   Period Weeks    Status Partially Met     OT LONG TERM GOAL #6   Title Patient will donn and doff small earrings with modified independence.   Baseline Goal update:  able to hold small backs and dropping only 25% of the time, slow to complete but improving.  Min assist    Time 12   Period Weeks   Status Partially Met     OT LONG TERM GOAL #7   Title Patient will be able to zip her rainboots while they are on her feet with modified independence.     Baseline moderate assist to don   Time 12   Period Weeks   Status On-going     OT LONG TERM GOAL #8   Title Patient to complete am self care demonstrating efficient methods of dressing skills to get to school on time.    Baseline Goal update:  patient continues to work towards efficiency and was late 1 time in the last 2 weeks.   Time 12   Period Weeks   Status Partially Met     OT LONG TERM GOAL  #9   Baseline Patient will don/doff socks and AFO braces with modified independence using adaptive equipment as needed.  (Baseline min assist)   Time 12   Period Weeks   Status New     OT LONG TERM GOAL  #10   TITLE Patient will demonstrate the ability to pull jeans down over AFOs with modified independence.    Baseline moderate assist.   Time 12   Period Weeks   Status New     OT LONG TERM GOAL  #11   TITLE Will be able to squeeze tooth paste out of the tube when it is less than half full.   Baseline difficulty with small travel sized, may look into alternative ways since this has been a chronic issue.   Time 6   Period Months   Status Achieved     OT LONG TERM GOAL  #12   TITLE Patient will demonstrate managing wheelchair rain poncho in inclement weather with modified independence.   Baseline crutch tips are difficult for anyone to take them off, goal deferred.   Time 6   Period Months   Status Achieved     OT LONG TERM GOAL  #13   TITLE Patient will be able to complete 2 1/2 minutes straight with left shoulder flexion simulating driving with  a stearing wheel ball to be able to drive to college.    Baseline Patient has not been driving, her family takes  her to school and drops her off.    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    Baseline difficulty coordinating the use of both hands to complete task.    Status Partially Met     OT LONG TERM GOAL  #15   TITLE Patient will be able to obtain food tray in the cafeteria from a wheelchair level and take to the table without spilling with modified independence.    Baseline max assist   Time 6   Period Months   Status Achieved               Plan - 06/27/17 1551    Clinical Impression Statement Patient working towards improving ROM and strength to assist with self care and IADL tasks.  She has some difficulty with end range reaching over repetitions.  She is able to tolerate use of 1# weight on wrists while performing task in sitting, cues for weight shifts. Continue to work towards goals in plan of care to improve skills to complete daily tasks with greater independence.    Rehab Potential Good   OT Frequency 1x / week   OT Duration 12 weeks   OT Treatment/Interventions Self-care/ADL training;Therapeutic exercise;Therapeutic exercises;Therapeutic activities;DME and/or AE instruction;Patient/family education   Consulted and Agree with Plan of Care Patient      Patient will benefit from skilled therapeutic intervention in order to improve the following deficits and impairments:  Impaired UE functional use, Difficulty walking, Decreased strength, Decreased coordination, Decreased endurance, Decreased balance  Visit Diagnosis: Muscle weakness (generalized)  Other lack of coordination    Problem List There are no active problems to display for this patient.  Amy T Lovett, OTR/L,  CLT  Lovett,Amy 06/27/2017, 4:01 PM  [facility] MAIN REHAB SERVICES [address] [place], [place], [address] Phone: [phone]   Fax:  [phone]  Name: Baleria N Pennino MRN: [id number] Date of Birth: 02/24/1996 

## 2017-06-28 ENCOUNTER — Ambulatory Visit: Payer: Medicaid Other | Admitting: Occupational Therapy

## 2017-06-28 ENCOUNTER — Ambulatory Visit: Payer: Medicaid Other | Admitting: Physical Therapy

## 2017-06-28 ENCOUNTER — Encounter: Payer: Self-pay | Admitting: Physical Therapy

## 2017-06-28 ENCOUNTER — Encounter: Payer: Self-pay | Admitting: Occupational Therapy

## 2017-06-28 DIAGNOSIS — R278 Other lack of coordination: Secondary | ICD-10-CM

## 2017-06-28 DIAGNOSIS — M6281 Muscle weakness (generalized): Secondary | ICD-10-CM | POA: Diagnosis not present

## 2017-06-28 DIAGNOSIS — R262 Difficulty in walking, not elsewhere classified: Secondary | ICD-10-CM

## 2017-06-28 NOTE — Therapy (Signed)
[place] MAIN [facility] SERVICES [address] [place], [place], [address] Phone: [phone]   Fax:  [phone]  Physical Therapy Treatment  Patient Details  Name: Michelle Randall MRN: [id number] Date of Birth: 08-22-1996 No Data Recorded  Encounter Date: 06/28/2017      PT End of Session - 06/28/17 0940    Visit Number 95   Number of Visits 98   Date for PT Re-Evaluation 07/12/17   Authorization Type 8/12   Authorization Time Period 12 visits 6/20-9/11/18   PT Start Time 0930   PT Stop Time 1015   PT Time Calculation (min) 45 min   Equipment Utilized During Treatment Gait belt   Activity Tolerance Patient tolerated treatment well   Behavior During Therapy [facility] for tasks assessed/performed      History reviewed. No pertinent past medical history.  History reviewed. No pertinent surgical history.  There were no vitals filed for this visit.      Subjective Assessment - 06/28/17 0939    Subjective Patient reports feeling good today and has had a good weekend.    Limitations Walking   Patient Stated Goals Patient wants to improve her core strength.    Currently in Pain? No/denies   Pain Score 0-No pain   Multiple Pain Sites No       Nu Step warm up 5 minutes  There-ex Lat pull down at tower with rope 7.5 lbs 2 x 20. Patient instructed to control core and trunk posture while activating scapular retractors at end of motion. Rows at tower with multi-purpose bar 7.5 lbs 2 x 15. Patient instructed to concentrate on keeping core stabilized and engage scapular retractors.  Elbow planks 3 x 20 seconds. Patient is able to clear hips off of table with good scapular control. Moderate cueing necessary to decrease lumbar extension.  Side planks 3 x 15 seconds. Patient shows good stability in shoulders and core. Patient fatigues quickly on R side in comparison to L.   Sidelying hip abduction with PT assist 10 x 3 seconds each. Patient shows  heavy bias towards hip flexion due to impaired ability to engage glutes.  Patient compensates with hip flexors and quads to help elevate leg; patient unable to achieve hip abduction against gravity.  10 minutes of session used to figure out upcoming schedule to fit in PT with new semester starting this month.                                 PT Education - 06/28/17 [phone]    Education provided Yes   Education Details Continuing to improve functional mobility and core strengthening   Person(s) Educated Patient   Methods Explanation   Comprehension Verbalized understanding             PT Long Term Goals - 06/21/17 0942      PT LONG TERM GOAL #1   Title Patient will improve Dynamic Gait Index (DGI) score to > 19/24 for low falls risk regarding dynamic walking tasks    Baseline 14/24; 11/02/16: 15/24 01/28/17: 18/24 6/5: 19/24   Time 12   Period Weeks   Status Achieved     PT LONG TERM GOAL #2   Title Patient will increase Berg Balance score by > 6 points to demonstrate decreased fall risk during functional activities    Baseline 11/02/16: 34/56 01/28/17: 39/56 6/5: 45/56   Time 12  Period Weeks   Status Achieved     PT LONG TERM GOAL #3   Title Patient will be able to transfer in and out of a large car with Patient will be able to transfer in and out of a high seated car with CGA    Baseline Patient requires min for transfer into large car   Time 12   Period Weeks   Status On-going     PT LONG TERM GOAL #4   Title Patient will complete a TUG test in < 12 seconds for independent mobility and decreased fall risk    Baseline 11.45   Time 12   Period Weeks   Status Achieved     PT LONG TERM GOAL #5   Title Patient will improve 6 minute walk distance by > 150 ft for improved return to functional community activities    Baseline 740 01/28/2017: 771f 6/5: 795   Time 12   Status On-going     PT LONG TERM GOAL #6   Title Patient will improve gait  speed to > 1.2 m/s with least restrictive assistive device to return to normal walking speed    Baseline .76 m/h 01/28/2017: .87   Time 12   Period Weeks   Status On-going     PT LONG TERM GOAL #7   Title Patient (< 668years old) will complete five times sit to stand test in < 10 seconds indicating an increased LE strength and improved balance    Baseline 9.45 sec   Time 12   Period Weeks   Status Achieved     PT LONG TERM GOAL #8   Title Patient will be able to ambulate on inclines and grass independenlty with LRAD   Baseline Can ambulate over smooth grass but unable to safely amb over heavy uneven surfaces or gravel, need loftstrand curtches   Time 12   Period Weeks   Status On-going     PT LONG TERM GOAL  #9   TITLE Patient will be abe to transfer from low chair or stool wihtout UE support independently   Baseline Patient needs min asssit to transfer from a low stool   Period Weeks   Status Achieved     PT LONG TERM GOAL  #10   TITLE Patient will be able to transfer from the floor to standing independently with use of LRAD   Baseline Patient needs min assist to transfer from the floor to standing   Time 12   Period Weeks   Status On-going     PT LONG TERM GOAL  #11   TITLE Patient will step up onto a 6" step 5x with AD independently to increase community mobility   Baseline Pt. has difficulty stepping up with secondary leg and experiences LOB at this time affecting ability to negotiate curbs   Time 12   Period Weeks   Status Partially Met               Plan - 06/28/17 1027    Clinical Impression Statement Patient tolerated therex well and experienced significant muscle fatigue of scapular stabilizers and UE musculature.  Patient demonstrates improvement with scapular control but requires moderate VCs for neutral pelvis and spine during core exercises. PT assist required for all B LE exercises due to weakness. Patient will continue to benefit from skilled physical  therapy to improve B UE endurance and core stability to increase independence.   Clinical Impairments Affecting Rehab Potential weakness and decreased  standing balance   PT Frequency 1x / week   PT Duration 12 weeks   PT Treatment/Interventions Therapeutic exercise;Therapeutic activities;Gait training;Balance training;Stair training;DME Instruction;Neuromuscular re-education;Patient/family education   PT Next Visit Plan Continue POC   PT Home Exercise Plan Continued from previous sessions.    Consulted and Agree with Plan of Care Patient      Patient will benefit from skilled therapeutic intervention in order to improve the following deficits and impairments:  Abnormal gait, Decreased balance, Decreased endurance, Difficulty walking, Decreased strength  Visit Diagnosis: Muscle weakness (generalized)  Difficulty in walking, not elsewhere classified     Problem List There are no active problems to display for this patient. This entire session was performed under direct supervision and direction of a licensed therapist/therapist assistant . I have personally read, edited and approve of the note as written. Sheliah Plane SPT  [place], [place], DPT 06/28/2017, 10:30 AM  [place] MAIN [facility] SERVICES [address] [place], [place], [address] Phone: [phone]   Fax:  [phone]  Name: PATRECE TALLIE MRN: [id number] Date of Birth: [birth year]

## 2017-06-28 NOTE — Therapy (Signed)
[place] MAIN [facility] SERVICES [address] [place], [place], [address] Phone: [phone]   Fax:  [phone]  Occupational Therapy Treatment  Patient Details  Name: Michelle Randall MRN: [id number] Date of Birth: 07/27/1996 No Data Recorded  Encounter Date: 06/28/2017      OT End of Session - 06/28/17 1151    Visit Number 87   Number of Visits 106   Date for OT Re-Evaluation 09/09/17   Authorization Type medicaid visit 26 of 106   OT Start Time 1029   OT Stop Time 1115   OT Time Calculation (min) 46 min   Activity Tolerance Patient tolerated treatment well   Behavior During Therapy [facility] for tasks assessed/performed      History reviewed. No pertinent past medical history.  History reviewed. No pertinent surgical history.  There were no vitals filed for this visit.      Subjective Assessment - 06/28/17 1017    Subjective  Patient reports she just got her schedule for the fall worked out but is disappointed she has a 820 3 days a week which is difficult for her to get to on time. Will probably try to come to therapy on Tues/thurs early morning.     Patient Stated Goals To be as independent as possible at home and at school.    Currently in Pain? No/denies   Pain Score 0-No pain                      OT Treatments/Exercises (OP) - 06/28/17 1126      ADLs   ADL Comments Patient seen for donning and doffing of AFO on left side, cues and attempts to problem solve other ways to be able to unhook/unlock brace which is difficult at times.      Fine Motor Coordination   Other Fine Motor Exercises Patient seen for manipulation of nuts, bolts, washers for finger motor coordination skills with emphasis on thumb finger combinations with right hand, as well as speed of task.      Neurological Re-education Exercises   Other Exercises 1 Patient seen for red resistive theraband exercises for 2 sets of 15 repetitions each for  shoulder flexion, ABD, diagonal patterns with cues for technique and posture while performing.                 OT Education - 06/28/17 1151    Education provided Yes   Education Details management of AFO   Person(s) Educated Patient   Methods Explanation;Demonstration;Verbal cues   Comprehension Verbal cues required;Returned demonstration;Verbalized understanding             OT Long Term Goals - 06/14/17 1547      OT LONG TERM GOAL #1   Title Patient will demonstrate increased strength in right hand sufficient to scoop ice cream from frozen container with modified independence.   Baseline goal update:  patient now able to complete with minimal assist at times.   Time 12   Period Months   Status On-going     OT LONG TERM GOAL #2   Title Patient will be able to make her bed with modified independence.   Baseline she continues to have difficulty with balance and completing this task, increased time, minimal assist at goal update   Time 6   Period Months   Status On-going     OT LONG TERM GOAL #3   Title Patient will demonstrate the ability to perform  vacuuming of one room with modified independence using light weight vacuum.   Baseline moderate assist at goal update.   Time 6   Period Months   Status On-going     OT LONG TERM GOAL #4   Title Patient will demonstrate the ability to open containers with sealed tops such as on yogurt containers and individual ice cream containers.    Baseline goal update:  occasional assistance required but able to complete more than 50% of the time with modified independence.    Time 12   Period Weeks   Status Partially Met     OT LONG TERM GOAL #5   Title Patient will demonstrate the ability to perform small buttons on shirts independently and with good speed in order to dress for school.   Baseline goal update: Patient can perform with increased time but occasionally needs assist if rushed.   Time 12   Period Weeks   Status  Partially Met     OT LONG TERM GOAL #6   Title Patient will donn and doff small earrings with modified independence.   Baseline Goal update:  able to hold small backs and dropping only 25% of the time, slow to complete but improving.  Min assist    Time 12   Period Weeks   Status Partially Met     OT LONG TERM GOAL #7   Title Patient will be able to zip her rainboots while they are on her feet with modified independence.     Baseline moderate assist to don   Time 12   Period Weeks   Status On-going     OT LONG TERM GOAL #8   Title Patient to complete am self care demonstrating efficient methods of dressing skills to get to school on time.    Baseline Goal update:  patient continues to work towards efficiency and was late 1 time in the last 2 weeks.   Time 12   Period Weeks   Status Partially Met     OT LONG TERM GOAL  #9   Baseline Patient will don/doff socks and AFO braces with modified independence using adaptive equipment as needed.  (Baseline min assist)   Time 12   Period Weeks   Status New     OT LONG TERM GOAL  #10   TITLE Patient will demonstrate the ability to pull jeans down over AFOs with modified independence.    Baseline moderate assist.   Time 12   Period Weeks   Status New     OT LONG TERM GOAL  #11   TITLE Will be able to squeeze tooth paste out of the tube when it is less than half full.   Baseline difficulty with small travel sized, may look into alternative ways since this has been a chronic issue.   Time 6   Period Months   Status Achieved     OT LONG TERM GOAL  #12   TITLE Patient will demonstrate managing wheelchair rain poncho in inclement weather with modified independence.   Baseline crutch tips are difficult for anyone to take them off, goal deferred.   Time 6   Period Months   Status Achieved     OT LONG TERM GOAL  #13   TITLE Patient will be able to complete 2 1/2 minutes straight with left shoulder flexion simulating driving with a  stearing wheel ball to be able to drive to college.    Baseline Patient has not been driving,  her family takes her to school and drops her off.    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    Baseline difficulty coordinating the use of both hands to complete task.    Status Partially Met     OT LONG TERM GOAL  #15   TITLE Patient will be able to obtain food tray in the cafeteria from a wheelchair level and take to the table without spilling with modified independence.    Baseline max assist   Time 6   Period Months   Status Achieved               Plan - 06/28/17 1152    Clinical Impression Statement Patient occasionally has difficulty with left AFO especially when her hands are fatigued, her brace is designed in 2 pieces which interlock in place and requires increased hand strength to unlock to be able to doff. Will continue to work towards strategies to help with this situation.  Patient continues to work towards speed, dexterity with coordination tasks to be more independent at home, school and the community,   Rehab Potential Good   OT Frequency 1x / week   OT Duration 12 weeks   OT Treatment/Interventions Self-care/ADL training;Therapeutic exercise;Therapeutic exercises;Therapeutic activities;DME and/or AE instruction;Patient/family education   Consulted and Agree with Plan of Care Patient      Patient will benefit from skilled therapeutic intervention in order to improve the following deficits and impairments:  Impaired UE functional use, Difficulty walking, Decreased strength, Decreased coordination, Decreased endurance, Decreased balance  Visit Diagnosis: Muscle weakness (generalized)  Other lack of coordination    Problem List There are no active problems to display for this  patient.  Achilles Dunk, OTR/L, CLT \ Michelle Randall 06/28/2017, 11:56 AM  [place] MAIN [facility] SERVICES [address] [place], [place], [address] Phone: [phone]   Fax:  [phone]  Name: Michelle Randall MRN: [id number] Date of Birth: [birth year]

## 2017-07-05 ENCOUNTER — Ambulatory Visit: Payer: Medicaid Other | Admitting: Occupational Therapy

## 2017-07-05 ENCOUNTER — Ambulatory Visit: Payer: Medicaid Other | Admitting: Physical Therapy

## 2017-07-06 ENCOUNTER — Encounter: Payer: Self-pay | Admitting: Occupational Therapy

## 2017-07-06 ENCOUNTER — Ambulatory Visit: Payer: Medicaid Other | Admitting: Occupational Therapy

## 2017-07-06 ENCOUNTER — Encounter: Payer: Self-pay | Admitting: Physical Therapy

## 2017-07-06 ENCOUNTER — Ambulatory Visit: Payer: Medicaid Other | Admitting: Physical Therapy

## 2017-07-06 DIAGNOSIS — M6281 Muscle weakness (generalized): Secondary | ICD-10-CM | POA: Diagnosis not present

## 2017-07-06 DIAGNOSIS — R278 Other lack of coordination: Secondary | ICD-10-CM

## 2017-07-06 DIAGNOSIS — R262 Difficulty in walking, not elsewhere classified: Secondary | ICD-10-CM

## 2017-07-06 NOTE — Therapy (Signed)
[place] MAIN [facility] SERVICES [address] [place], [place], [address] Phone: [phone]   Fax:  [phone]  Physical Therapy Treatment  Patient Details  Name: Michelle Randall MRN: [id number] Date of Birth: 03/29/1996 No Data Recorded  Encounter Date: 07/06/2017      PT End of Session - 07/06/17 0813    Visit Number 96   Number of Visits 98   Date for PT Re-Evaluation 07/12/17   Authorization Type 9/12   Authorization Time Period 12 visits 6/20-9/11/18   PT Start Time 0815   PT Stop Time 0900   PT Time Calculation (min) 45 min   Equipment Utilized During Treatment Gait belt   Activity Tolerance Patient tolerated treatment well   Behavior During Therapy [facility] for tasks assessed/performed      History reviewed. No pertinent past medical history.  History reviewed. No pertinent surgical history.  There were no vitals filed for this visit.      Subjective Assessment - 07/06/17 0913    Subjective Pt reports feeling well today and denies any pain;   Limitations Walking   Patient Stated Goals Patient wants to improve her core strength.    Currently in Pain? No/denies   Multiple Pain Sites No       PT TREATMENT;    Nu Step; 4 min BLE and BUE (unbilled)   Prone plank 2 x 45 sec; PT assist for LE position in plank  Prone cobra 5 sec hold x 10; cue for increased extension and scapular retraction at end range  Qped;  Bird dog alt LE and UE x 10 ea; LE extended along table with UE flexion; Pt cued to minimize lumbar extension and keep pelvis posteriorly tilted during UE and LE movement;  Hooklying;  BLE Bridge 2 x 10; PT assist for feet positioning  Supine;   Flutter kicks 2 x 20 ea LE flexion about 6"; pt verbally cued to hold core during exercise and continue breath support;  seated on pball; to challenge postural control and promote weight shift pelvic rocks; lateral, anterior and posterior x 10 ea  alt marching; x 10  ea; Pt had more difficulty with R flexion and weight shift to LLE alt marching and opp UE flexion x 10 ea twists x 10 ea with med ball; Twist to the R is more challenging for balance control Oblique twists x 10 with med ball  Patient required min-moderate verbal/tactile cues for correct exercise technique and to increase core abdominal stabilization with UE/LE movement  Prone plank to fatigue x 53 sec;   Pt tolerated treatment well with no increase in pain;                             PT Education - 07/06/17 0914    Education provided Yes   Education Details core strengthening and stabilization   Person(s) Educated Patient   Methods Explanation;Verbal cues;Tactile cues   Comprehension Verbalized understanding;Returned demonstration;Verbal cues required             PT Long Term Goals - 06/21/17 0942      PT LONG TERM GOAL #1   Title Patient will improve Dynamic Gait Index (DGI) score to > 19/24 for low falls risk regarding dynamic walking tasks    Baseline 14/24; 11/02/16: 15/24 01/28/17: 18/24 6/5: 19/24   Time 12   Period Weeks   Status Achieved     PT LONG  TERM GOAL #2   Title Patient will increase Berg Balance score by > 6 points to demonstrate decreased fall risk during functional activities    Baseline 11/02/16: 34/56 01/28/17: 39/56 6/5: 45/56   Time 12   Period Weeks   Status Achieved     PT LONG TERM GOAL #3   Title Patient will be able to transfer in and out of a large car with Patient will be able to transfer in and out of a high seated car with CGA    Baseline Patient requires min for transfer into large car   Time 12   Period Weeks   Status On-going     PT LONG TERM GOAL #4   Title Patient will complete a TUG test in < 12 seconds for independent mobility and decreased fall risk    Baseline 11.45   Time 12   Period Weeks   Status Achieved     PT LONG TERM GOAL #5   Title Patient will improve 6 minute walk distance by > 150 ft for  improved return to functional community activities    Baseline 740 01/28/2017: 771f 6/5: 795   Time 12   Status On-going     PT LONG TERM GOAL #6   Title Patient will improve gait speed to > 1.2 m/s with least restrictive assistive device to return to normal walking speed    Baseline .76 m/h 01/28/2017: .87   Time 12   Period Weeks   Status On-going     PT LONG TERM GOAL #7   Title Patient (< 61years old) will complete five times sit to stand test in < 10 seconds indicating an increased LE strength and improved balance    Baseline 9.45 sec   Time 12   Period Weeks   Status Achieved     PT LONG TERM GOAL #8   Title Patient will be able to ambulate on inclines and grass independenlty with LRAD   Baseline Can ambulate over smooth grass but unable to safely amb over heavy uneven surfaces or gravel, need loftstrand curtches   Time 12   Period Weeks   Status On-going     PT LONG TERM GOAL  #9   TITLE Patient will be abe to transfer from low chair or stool wihtout UE support independently   Baseline Patient needs min asssit to transfer from a low stool   Period Weeks   Status Achieved     PT LONG TERM GOAL  #10   TITLE Patient will be able to transfer from the floor to standing independently with use of LRAD   Baseline Patient needs min assist to transfer from the floor to standing   Time 12   Period Weeks   Status On-going     PT LONG TERM GOAL  #11   TITLE Patient will step up onto a 6" step 5x with AD independently to increase community mobility   Baseline Pt. has difficulty stepping up with secondary leg and experiences LOB at this time affecting ability to negotiate curbs   Time 12   Period Weeks   Status Partially Met               Plan - 07/06/17 0914    Clinical Impression Statement  Pt was instructed in core strengthening and stabilization exercises. Pt shows good core strength with plank and static exercise. Pt will require verbal min-mod verbal cues for  maintaining a neutral pelvis and continued breath  support during exercise; Pt requires PT assist for getting in prone and some positioning for exercise because weakness in LE; Pt wil continue to benefit from skilled PT in order to improve B UE endurance and core stability to improve indepndence;   Clinical Impairments Affecting Rehab Potential weakness and decreased standing balance   PT Frequency 1x / week   PT Duration 12 weeks   PT Treatment/Interventions Therapeutic exercise;Therapeutic activities;Gait training;Balance training;Stair training;DME Instruction;Neuromuscular re-education;Patient/family education   PT Next Visit Plan Continue POC   PT Home Exercise Plan Continued from previous sessions.    Consulted and Agree with Plan of Care Patient      Patient will benefit from skilled therapeutic intervention in order to improve the following deficits and impairments:  Abnormal gait, Decreased balance, Decreased endurance, Difficulty walking, Decreased strength  Visit Diagnosis: Muscle weakness (generalized)  Other lack of coordination  Difficulty in walking, not elsewhere classified     Problem List There are no active problems to display for this patient.  Doreene Nest, SPT This entire session was performed under direct supervision and direction of a licensed therapist/therapist assistant . I have personally read, edited and approve of the note as written.  Trotter,Margaret PT, DPT 07/06/2017, 11:07 AM  [place] MAIN [facility] SERVICES [address] [place], [place], [address] Phone: [phone]   Fax:  [phone]  Name: FIDELIA CATHERS MRN: [id number] Date of Birth: 01-Oct-1996

## 2017-07-08 NOTE — Therapy (Signed)
[place] MAIN [facility] SERVICES [address] [place], [place], [address] Phone: [phone]   Fax:  [phone]  Occupational Therapy Treatment  Patient Details  Name: Michelle Randall MRN: [id number] Date of Birth: August 10, 1996 No Data Recorded  Encounter Date: 07/06/2017      OT End of Session - 07/08/17 2011    Visit Number 88   Number of Visits 106   Date for OT Re-Evaluation 09/09/17   Authorization Type medicaid visit 68 of 106   OT Start Time 0901   OT Stop Time 0945   OT Time Calculation (min) 44 min   Activity Tolerance Patient tolerated treatment well   Behavior During Therapy [facility] for tasks assessed/performed      History reviewed. No pertinent past medical history.  History reviewed. No pertinent surgical history.  There were no vitals filed for this visit.      Subjective Assessment - 07/08/17 2011    Subjective  Patient reports she is starting school next week.  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  Reports she did well, took stretchy pants.  Had to room with 3 other girls this time which was different than before.  She used the wheelchair most of the time but crutches within the conference room.  Was going to [employer] but it ended up raining.    Patient Stated Goals To be as independent as possible at home and at school.    Currently in Pain? No/denies   Pain Score 0-No pain                      OT Treatments/Exercises (OP) - 07/08/17 2015      ADLs   ADL Comments Donning and doffing of AFO with cues for technique, use of both hands to perform.     Fine Motor Coordination   Other Fine Motor Exercises Fine motor coordination tasks with small sticks picking up and placing with use of oppositional movements with cues for technique and speed.     Neurological Re-education Exercises   Other Exercises 1 Sustained gripping with right and left hands with  hand gripper set on 4th setting, 23.4# for 25 repetitions each side. 28.9# for 5 repetitions on the right for 5 repetitions and then 2 reps of the left. Shape tower with 1# weight to place and remove 4 levels from seated position. Able to reach all levels with effort.                 OT Education - 07/08/17 2011    Education provided Yes   Education Details AFO donning/doffing   Person(s) Educated Patient   Methods Explanation;Demonstration;Verbal cues   Comprehension Verbal cues required;Returned demonstration;Verbalized understanding             OT Long Term Goals - 06/14/17 1547      OT LONG TERM GOAL #1   Title Patient will demonstrate increased strength in right hand sufficient to scoop ice cream from frozen container with modified independence.   Baseline goal update:  patient now able to complete with minimal assist at times.   Time 12   Period Months   Status On-going     OT LONG TERM GOAL #2   Title Patient will be able to make her bed with modified independence.   Baseline she continues to have difficulty with balance and completing this task, increased time, minimal assist at  goal update   Time 6   Period Months   Status On-going     OT LONG TERM GOAL #3   Title Patient will demonstrate the ability to perform vacuuming of one room with modified independence using light weight vacuum.   Baseline moderate assist at goal update.   Time 6   Period Months   Status On-going     OT LONG TERM GOAL #4   Title Patient will demonstrate the ability to open containers with sealed tops such as on yogurt containers and individual ice cream containers.    Baseline goal update:  occasional assistance required but able to complete more than 50% of the time with modified independence.    Time 12   Period Weeks   Status Partially Met     OT LONG TERM GOAL #5   Title Patient will demonstrate the ability to perform small buttons on shirts independently and with good  speed in order to dress for school.   Baseline goal update: Patient can perform with increased time but occasionally needs assist if rushed.   Time 12   Period Weeks   Status Partially Met     OT LONG TERM GOAL #6   Title Patient will donn and doff small earrings with modified independence.   Baseline Goal update:  able to hold small backs and dropping only 25% of the time, slow to complete but improving.  Min assist    Time 12   Period Weeks   Status Partially Met     OT LONG TERM GOAL #7   Title Patient will be able to zip her rainboots while they are on her feet with modified independence.     Baseline moderate assist to don   Time 12   Period Weeks   Status On-going     OT LONG TERM GOAL #8   Title Patient to complete am self care demonstrating efficient methods of dressing skills to get to school on time.    Baseline Goal update:  patient continues to work towards efficiency and was late 1 time in the last 2 weeks.   Time 12   Period Weeks   Status Partially Met     OT LONG TERM GOAL  #9   Baseline Patient will don/doff socks and AFO braces with modified independence using adaptive equipment as needed.  (Baseline min assist)   Time 12   Period Weeks   Status New     OT LONG TERM GOAL  #10   TITLE Patient will demonstrate the ability to pull jeans down over AFOs with modified independence.    Baseline moderate assist.   Time 12   Period Weeks   Status New     OT LONG TERM GOAL  #11   TITLE Will be able to squeeze tooth paste out of the tube when it is less than half full.   Baseline difficulty with small travel sized, may look into alternative ways since this has been a chronic issue.   Time 6   Period Months   Status Achieved     OT LONG TERM GOAL  #12   TITLE Patient will demonstrate managing wheelchair rain poncho in inclement weather with modified independence.   Baseline crutch tips are difficult for anyone to take them off, goal deferred.   Time 6   Period  Months   Status Achieved     OT LONG TERM GOAL  #13   TITLE Patient will be able  to complete 2 1/2 minutes straight with left shoulder flexion simulating driving with a stearing wheel ball to be able to drive to college.    Baseline Patient has not been driving, her family takes her to school and drops her off.    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    Baseline difficulty coordinating the use of both hands to complete task.    Status Partially Met     OT LONG TERM GOAL  #15   TITLE Patient will be able to obtain food tray in the cafeteria from a wheelchair level and take to the table without spilling with modified independence.    Baseline max assist   Time 6   Period Months   Status Achieved               Plan - 07/08/17 2012    Clinical Impression Statement Patient is becoming more consistent with donning and doffing AFO and was able to go on another trip this last week and was able to take care of herself.  She did also wear stretchy clothes to help get pants over her braces.  She continues to progress with stregnth and coordination and has now been able to go away for 2 weekends without her parents to help her.     Rehab Potential Good   OT Frequency 1x / week   OT Duration 12 weeks   OT Treatment/Interventions Self-care/ADL training;Therapeutic exercise;Therapeutic exercises;Therapeutic activities;DME and/or AE instruction;Patient/family education   Consulted and Agree with Plan of Care Patient      Patient will benefit from skilled therapeutic intervention in order to improve the following deficits and impairments:  Impaired UE functional use, Difficulty walking, Decreased strength, Decreased coordination, Decreased endurance, Decreased balance  Visit Diagnosis: Muscle weakness  (generalized)  Other lack of coordination    Problem List There are no active problems to display for this patient.  Achilles Dunk, OTR/L, CLT  Elmer Merwin 07/08/2017, 8:17 PM  [place] MAIN [facility] SERVICES [address] [place], [place], [address] Phone: [phone]   Fax:  [phone]  Name: Michelle Randall MRN: [id number] Date of Birth: 12-07-1995

## 2017-07-12 ENCOUNTER — Ambulatory Visit: Payer: Medicaid Other | Admitting: Occupational Therapy

## 2017-07-12 ENCOUNTER — Ambulatory Visit: Payer: Medicaid Other | Admitting: Physical Therapy

## 2017-07-12 ENCOUNTER — Encounter: Payer: Self-pay | Admitting: Physical Therapy

## 2017-07-12 DIAGNOSIS — R278 Other lack of coordination: Secondary | ICD-10-CM

## 2017-07-12 DIAGNOSIS — R279 Unspecified lack of coordination: Secondary | ICD-10-CM

## 2017-07-12 DIAGNOSIS — M6281 Muscle weakness (generalized): Secondary | ICD-10-CM

## 2017-07-12 DIAGNOSIS — R262 Difficulty in walking, not elsewhere classified: Secondary | ICD-10-CM

## 2017-07-12 NOTE — Therapy (Signed)
[place] MAIN [facility] SERVICES [address] [place], [place], [address] Phone: [phone]   Fax:  [phone]  Physical Therapy Treatment  Patient Details  Name: Michelle Randall MRN: [id number] Date of Birth: 1996/06/12 No Data Recorded  Encounter Date: 07/12/2017      PT End of Session - 07/12/17 0937    Visit Number 97   Number of Visits 98   Date for PT Re-Evaluation 07/12/17   Authorization Type 10/12   Authorization Time Period 12 visits 6/20-9/11/18   PT Start Time 0915   PT Stop Time 1000   PT Time Calculation (min) 45 min   Equipment Utilized During Treatment Gait belt   Activity Tolerance Patient tolerated treatment well   Behavior During Therapy [facility] for tasks assessed/performed      History reviewed. No pertinent past medical history.  History reviewed. No pertinent surgical history.  There were no vitals filed for this visit.      Subjective Assessment - 07/12/17 0935    Subjective Pt reports feeling well today and denies any pain;   Patient is accompained by: Family member   Limitations Walking   Patient Stated Goals Patient wants to improve her core strength.    Currently in Pain? No/denies   Pain Score 0-No pain   Pain Onset In the past 7 days      Nu Step warm up 5 minutes  There-ex Lat pull down at tower with rope 7.5 lbs 2 x 20. Patient instructed to control core and trunk posture while activating scapular retractors at end of motion. Rows at tower with multi-purpose bar 7.5 lbs 2 x 15. Patient instructed to concentrate on keeping core stabilized and engage scapular retractors.  Elbow planks 3 x 20 seconds. Patient is able to clear hips off of table with good scapular control. Moderate cueing necessary to decrease lumbar extension.  Side planks 3 x 15 seconds. Patient shows good stability in shoulders and core. Patient fatigues quickly on R side in comparison to L.   Sidelying hip abduction with PT  assist 10 x 3 seconds each. Patient shows heavy bias towards hip flexion due to impaired ability to engage glutes.  Patient compensates with hip flexors and quads to help elevate leg; patient unable to achieve hip abduction against gravity                            PT Education - 07/12/17 0935    Education provided Yes   Education Details exercise technique   Person(s) Educated Patient   Methods Explanation   Comprehension Verbalized understanding             PT Long Term Goals - 06/21/17 0942      PT LONG TERM GOAL #1   Title Patient will improve Dynamic Gait Index (DGI) score to > 19/24 for low falls risk regarding dynamic walking tasks    Baseline 14/24; 11/02/16: 15/24 01/28/17: 18/24 6/5: 19/24   Time 12   Period Weeks   Status Achieved     PT LONG TERM GOAL #2   Title Patient will increase Berg Balance score by > 6 points to demonstrate decreased fall risk during functional activities    Baseline 11/02/16: 34/56 01/28/17: 39/56 6/5: 45/56   Time 12   Period Weeks   Status Achieved     PT LONG TERM GOAL #3   Title Patient will be able to transfer  in and out of a large car with Patient will be able to transfer in and out of a high seated car with CGA    Baseline Patient requires min for transfer into large car   Time 12   Period Weeks   Status On-going     PT LONG TERM GOAL #4   Title Patient will complete a TUG test in < 12 seconds for independent mobility and decreased fall risk    Baseline 11.45   Time 12   Period Weeks   Status Achieved     PT LONG TERM GOAL #5   Title Patient will improve 6 minute walk distance by > 150 ft for improved return to functional community activities    Baseline 740 01/28/2017: 774f 6/5: 795   Time 12   Status On-going     PT LONG TERM GOAL #6   Title Patient will improve gait speed to > 1.2 m/s with least restrictive assistive device to return to normal walking speed    Baseline .76 m/h 01/28/2017: .87    Time 12   Period Weeks   Status On-going     PT LONG TERM GOAL #7   Title Patient (< 680years old) will complete five times sit to stand test in < 10 seconds indicating an increased LE strength and improved balance    Baseline 9.45 sec   Time 12   Period Weeks   Status Achieved     PT LONG TERM GOAL #8   Title Patient will be able to ambulate on inclines and grass independenlty with LRAD   Baseline Can ambulate over smooth grass but unable to safely amb over heavy uneven surfaces or gravel, need loftstrand curtches   Time 12   Period Weeks   Status On-going     PT LONG TERM GOAL  #9   TITLE Patient will be abe to transfer from low chair or stool wihtout UE support independently   Baseline Patient needs min asssit to transfer from a low stool   Period Weeks   Status Achieved     PT LONG TERM GOAL  #10   TITLE Patient will be able to transfer from the floor to standing independently with use of LRAD   Baseline Patient needs min assist to transfer from the floor to standing   Time 12   Period Weeks   Status On-going     PT LONG TERM GOAL  #11   TITLE Patient will step up onto a 6" step 5x with AD independently to increase community mobility   Baseline Pt. has difficulty stepping up with secondary leg and experiences LOB at this time affecting ability to negotiate curbs   Time 12   Period Weeks   Status Partially Met               Plan - 07/12/17 0943    Clinical Impression Statement Patient performs strengthening exercises for core and is able to perform planks, bridges and exercises in quadriped and kneeling position. She requires assist for correct technique and getting into the correct positions She has difficulty with getting height for sidelying  planks due to decreased strength. She iwll continue to benefit from skilled PT to improve strength and balance.    Rehab Potential Good   Clinical Impairments Affecting Rehab Potential weakness and decreased standing  balance   PT Frequency 1x / week   PT Duration 12 weeks   PT Treatment/Interventions Therapeutic exercise;Therapeutic activities;Gait training;Balance training;Stair  training;DME Instruction;Neuromuscular re-education;Patient/family education   PT Next Visit Plan Continue POC   PT Home Exercise Plan Continued from previous sessions.    Consulted and Agree with Plan of Care Patient      Patient will benefit from skilled therapeutic intervention in order to improve the following deficits and impairments:  Abnormal gait, Decreased balance, Decreased endurance, Difficulty walking, Decreased strength  Visit Diagnosis: Muscle weakness (generalized)  Other lack of coordination  Difficulty in walking, not elsewhere classified  Lack of coordination     Problem List There are no active problems to display for this patient.   [address] [place] DPT 07/12/2017, 10:04 AM  [place] MAIN [facility] SERVICES [place], [place], [address] Phone: [phone]   Fax:  [phone]  Name: Michelle Randall MRN: [id number] Date of Birth: 1996-04-19

## 2017-07-12 NOTE — Therapy (Signed)
[place] MAIN [facility] SERVICES [address] [place], [place], [address] Phone: [phone]   Fax:  [phone]  Occupational Therapy Treatment  Patient Details  Name: Michelle Randall MRN: [id number] Date of Birth: 28-Mar-1996 No Data Recorded  Encounter Date: 07/12/2017      OT End of Session - 07/12/17 1032    Visit Number 89   Number of Visits 106   Date for OT Re-Evaluation 09/09/17   Authorization Type medicaid visit 2 of 106   OT Start Time 1005   OT Stop Time 1050   OT Time Calculation (min) 45 min   Activity Tolerance Patient tolerated treatment well   Behavior During Therapy WFL for tasks assessed/performed      No past medical history on file.  No past surgical history on file.  There were no vitals filed for this visit.      Subjective Assessment - 07/12/17 1030    Subjective  Patient reports today is the first day of class at [employer] however she does not have any classes on Tuesdays. She reports she had difficulty with opening up a banana for eating without squishing it.    Patient Stated Goals To be as independent as possible at home and at school.    Currently in Pain? No/denies   Pain Score 0-No pain                      OT Treatments/Exercises (OP) - 07/12/17 1550      Fine Motor Coordination   Other Fine Motor Exercises Patient see for manipulation of small pieces of Purdue pegboard with washers, dowels and collars with cues for using the hand for storage as well as translatory skills of the hand.  Both right and left UE completed.      Neurological Re-education Exercises   Other Exercises 1 Patient seen for resistive pinch skills, all levels of pinch pinch for lateral and 3 point pinch to place and remove to elevated surface encouraging multi directional reach with cues.  Patient performed resistive pinch/pull exercise with putty to simulate opening up a banana, cues for technique, green putty  utilized.                  OT Education - 07/12/17 1032    Education provided Yes   Education Details putty for pinch and pull to simulate manipulation of opening a banana   Person(s) Educated Patient   Methods Explanation;Demonstration;Verbal cues   Comprehension Verbal cues required;Returned demonstration;Verbalized understanding             OT Long Term Goals - 06/14/17 1547      OT LONG TERM GOAL #1   Title Patient will demonstrate increased strength in right hand sufficient to scoop ice cream from frozen container with modified independence.   Baseline goal update:  patient now able to complete with minimal assist at times.   Time 12   Period Months   Status On-going     OT LONG TERM GOAL #2   Title Patient will be able to make her bed with modified independence.   Baseline she continues to have difficulty with balance and completing this task, increased time, minimal assist at goal update   Time 6   Period Months   Status On-going     OT LONG TERM GOAL #3   Title Patient will demonstrate the ability to perform vacuuming of one room with modified independence using light  weight vacuum.   Baseline moderate assist at goal update.   Time 6   Period Months   Status On-going     OT LONG TERM GOAL #4   Title Patient will demonstrate the ability to open containers with sealed tops such as on yogurt containers and individual ice cream containers.    Baseline goal update:  occasional assistance required but able to complete more than 50% of the time with modified independence.    Time 12   Period Weeks   Status Partially Met     OT LONG TERM GOAL #5   Title Patient will demonstrate the ability to perform small buttons on shirts independently and with good speed in order to dress for school.   Baseline goal update: Patient can perform with increased time but occasionally needs assist if rushed.   Time 12   Period Weeks   Status Partially Met     OT LONG TERM  GOAL #6   Title Patient will donn and doff small earrings with modified independence.   Baseline Goal update:  able to hold small backs and dropping only 25% of the time, slow to complete but improving.  Min assist    Time 12   Period Weeks   Status Partially Met     OT LONG TERM GOAL #7   Title Patient will be able to zip her rainboots while they are on her feet with modified independence.     Baseline moderate assist to don   Time 12   Period Weeks   Status On-going     OT LONG TERM GOAL #8   Title Patient to complete am self care demonstrating efficient methods of dressing skills to get to school on time.    Baseline Goal update:  patient continues to work towards efficiency and was late 1 time in the last 2 weeks.   Time 12   Period Weeks   Status Partially Met     OT LONG TERM GOAL  #9   Baseline Patient will don/doff socks and AFO braces with modified independence using adaptive equipment as needed.  (Baseline min assist)   Time 12   Period Weeks   Status New     OT LONG TERM GOAL  #10   TITLE Patient will demonstrate the ability to pull jeans down over AFOs with modified independence.    Baseline moderate assist.   Time 12   Period Weeks   Status New     OT LONG TERM GOAL  #11   TITLE Will be able to squeeze tooth paste out of the tube when it is less than half full.   Baseline difficulty with small travel sized, may look into alternative ways since this has been a chronic issue.   Time 6   Period Months   Status Achieved     OT LONG TERM GOAL  #12   TITLE Patient will demonstrate managing wheelchair rain poncho in inclement weather with modified independence.   Baseline crutch tips are difficult for anyone to take them off, goal deferred.   Time 6   Period Months   Status Achieved     OT LONG TERM GOAL  #13   TITLE Patient will be able to complete 2 1/2 minutes straight with left shoulder flexion simulating driving with a stearing wheel ball to be able to  drive to college.    Baseline Patient has not been driving, her family takes her to school and drops her  off.    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    Baseline difficulty coordinating the use of both hands to complete task.    Status Partially Met     OT LONG TERM GOAL  #15   TITLE Patient will be able to obtain food tray in the cafeteria from a wheelchair level and take to the table without spilling with modified independence.    Baseline max assist   Time 6   Period Months   Status Achieved               Plan - 07/12/17 1032    Clinical Impression Statement Patient continues to progress with BUE strengthening and pinch, has some reported difficulty with opening up and peeling a banana, worked on associated motions with mild resistance this date and instructed patient to work on at home as a part of her home program.  Patient improving with speed of manipulation of small objects smaller than 1`/2 inch in size.  Continue to work towards goals to improve skills to perform daily tasks with greater independence.    Rehab Potential Good   OT Frequency 1x / week   OT Duration 12 weeks   OT Treatment/Interventions Self-care/ADL training;Therapeutic exercise;Therapeutic exercises;Therapeutic activities;DME and/or AE instruction;Patient/family education   Consulted and Agree with Plan of Care Patient      Patient will benefit from skilled therapeutic intervention in order to improve the following deficits and impairments:  Impaired UE functional use, Difficulty walking, Decreased strength, Decreased coordination, Decreased endurance, Decreased balance  Visit Diagnosis: Muscle weakness (generalized)  Other lack of coordination    Problem List There are no active problems to display for this  patient.  Achilles Dunk, OTR/L, CLT  Lovett,Amy 07/12/2017, 3:56 PM  [place] MAIN [facility] SERVICES [address] [place], [place], [address] Phone: [phone]   Fax:  [phone]  Name: Michelle Randall MRN: [id number] Date of Birth: January 17, 1996

## 2017-07-19 ENCOUNTER — Ambulatory Visit: Payer: Medicaid Other | Admitting: Occupational Therapy

## 2017-07-19 ENCOUNTER — Encounter: Payer: Self-pay | Admitting: Occupational Therapy

## 2017-07-19 ENCOUNTER — Encounter: Payer: Self-pay | Admitting: Physical Therapy

## 2017-07-19 ENCOUNTER — Ambulatory Visit: Payer: Medicaid Other | Attending: Pediatrics | Admitting: Physical Therapy

## 2017-07-19 DIAGNOSIS — R278 Other lack of coordination: Secondary | ICD-10-CM | POA: Diagnosis present

## 2017-07-19 DIAGNOSIS — R262 Difficulty in walking, not elsewhere classified: Secondary | ICD-10-CM | POA: Diagnosis present

## 2017-07-19 DIAGNOSIS — M6281 Muscle weakness (generalized): Secondary | ICD-10-CM | POA: Diagnosis not present

## 2017-07-19 NOTE — Therapy (Signed)
[place] MAIN [facility] SERVICES [address] [place], [place], [address] Phone: [phone]   Fax:  [phone]  Physical Therapy Treatment/Progress Note  Patient Details  Name: Michelle Randall MRN: [id number] Date of Birth: 05/15/1996 Referring Provider: Langley Gauss MD  Encounter Date: 07/19/2017      PT End of Session - 07/19/17 1235    Visit Number 98   Number of Visits 110   Date for PT Re-Evaluation 10/11/17   Authorization Type 11/12   Authorization Time Period 12 visits 6/20-9/11/18   PT Start Time 0800   PT Stop Time 0844   PT Time Calculation (min) 44 min   Equipment Utilized During Treatment Gait belt   Activity Tolerance Patient tolerated treatment well   Behavior During Therapy [facility] for tasks assessed/performed      History reviewed. No pertinent past medical history.  History reviewed. No pertinent surgical history.  There were no vitals filed for this visit.      Subjective Assessment - 07/19/17 0806    Subjective Pt reports feeling well with no changes since last session. She says she feels she is making progress. She reports she is doing well with her HEP.    Patient is accompained by: Family member   Limitations Walking   Patient Stated Goals Patient wants to improve her core strength.    Currently in Pain? No/denies   Pain Onset In the past 7 days      Treatment:  Goals Re-assessed:   Patient will be able to transfer in and out of a large car with  a high seat with CGA: Pt requires a step or siderail to step into a large SUV per pt report    6 min walk: 7' which is not significantly changed from last test on 04/19/17: 795'. This is below community ambulation and is <50% of her age group norm.   Patient will improve gait speed to > 1.2: Result = 0.82ms indicating pt is a limited community ambulator, which is slower than her last gait speed of 0.820m. 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   Patient will step up onto a 6" step 5x with AD independently: Pt currently requires supervision due to instability, but needs no physical assist, which is improved from 06/21/17 when pt required minA per pt report. Pt assessed in stair climb x 4 steps up and down, pt required B UE support through rails and is unable to climb stairs using Lofstrand crutches.  Ther-ex:   QpMarcene Brawnog     Single limb raise from mat table x 2 per limb alternating    Double contralateral limb raise alternating x 8; cues to raise RLE higher to clear mat table. Pt adducts at hip when extending to raise off mat table, likely due to hip abductor weakness.  Prone: Elbow planks 3 x 30 seconds with cues to avoid breath holding; pt had increasing difficulty with each rep with max effort for 3rd rep.   Sidelying:  Side planks 2 x 30 seconds each side, cues to stack feet, rotate pelvis and shoulders perpendicular to mat table. Pt had increased difficulty in R sidelying compared to L         PT Long Term Goals - 07/19/17 0825      PT LONG TERM GOAL #1   Title  Patient will improve Dynamic Gait Index (DGI) score to > 21/24 for meaningful improvement and low falls risk regarding dynamic walking tasks (revised from > 19/24 )   Baseline 14/24; 11/02/16: 15/24 01/28/17: 18/24 6/5: 19/24   Time 12   Period Weeks   Status Revised     PT LONG TERM GOAL #2   Title Patient will increase Berg Balance score by >51/56 points to be considered a low risk for falls for improved safety. (revised from >6 points improvement)   Baseline 11/02/16: 34/56 01/28/17: 39/56 6/5: 45/56   Time 12   Period Weeks   Status Revised     PT LONG TERM GOAL #3   Title Patient will be able to transfer in and out of a large car with  a high seat with CGA  to improve ability to go to school/doctor visits.    Baseline Patient requires min  A for transfer into large SUV unless it has a step rail on the side   Time 12   Period Weeks   Status On-going     PT LONG TERM GOAL #4   Title Patient will complete a TUG test in < 12 seconds for independent mobility and decreased fall risk    Baseline 11.45   Time 12   Period Weeks   Status Achieved     PT LONG TERM GOAL #5   Title Patient will improve 6 minute walk distance by > 150 ft for improved return to functional community activities    Baseline 740 01/28/2017: 710f 6/5: 795  730 on 07/19/17   Time 12   Status On-going     PT LONG TERM GOAL #6   Title Patient will improve gait speed to > 1.2 m/s with least restrictive assistive device to return to normal walking speed    Baseline .76 m/h 01/28/2017: .87  07/19/17 0.618m   Time 12   Period Weeks   Status On-going     PT LONG TERM GOAL #7   Title Patient (< 6079ears old) will complete five times sit to stand test in < 10 seconds indicating an increased LE strength and improved balance    Baseline 9.45 sec   Time 12   Period Weeks   Status Achieved     PT LONG TERM GOAL #8   Title Patient will be able to ambulate on inclines and grass independenlty with LRAD   Baseline Can ambulate over grassy inclines with decreased speed and safety requiring need loftstrand curtches and SBA   Time 12   Period Weeks   Status On-going     PT LONG TERM GOAL  #9   TITLE Patient will be abe to transfer from low chair or stool without UE support independently   Baseline Patient needs min A assist to transfer from a low stool   Period Weeks   Status Partially Met     PT LONG TERM GOAL  #10   TITLE Patient will be able to transfer from the floor to standing independently with use of LRAD   Baseline Patient able to stand from the floor independently if she has not been on the floor longer than about 5 min due to stiffness with prolonged positioning   Time 12   Period Weeks   Status On-going     PT LONG TERM GOAL  #11   TITLE Patient  will step up onto a 6" step 5x with AD independently to increase community mobility  Baseline Pt. has increased difficulty requiring supervision for safety, but no physical assist   Time 12   Period Weeks   Status Partially Met               Plan - 07/19/17 0825    Clinical Impression Statement Pt re-assessed in goals including 79mwalk test, 142malk, and multiple self-report goals. Pt had slightly decreased gait speed and 6 min walk distance this morning, though not significantly less on 6 min walk. Pt attributes this to being tired from taking early classes in school. Pt feels that her core strength has been improving recently due to ther-ex and HEP. She appears to be improving in her functional mobility goals with decreased reliance on assist for tasks such as climbing a grassy hill, or stepping onto a curb, though she still requires supervision with these for safety. Pt will continue to benefit from skilled therapy to maximize her functional mobility and safety to increase her community access with as much independence as possible.   Rehab Potential Good   Clinical Impairments Affecting Rehab Potential weakness and decreased standing balance   PT Frequency 1x / week   PT Duration 12 weeks   PT Treatment/Interventions Therapeutic exercise;Therapeutic activities;Gait training;Balance training;Stair training;DME Instruction;Neuromuscular re-education;Patient/family education   PT Next Visit Plan Continue POC   PT Home Exercise Plan Continued from previous sessions.    Consulted and Agree with Plan of Care Patient      Patient will benefit from skilled therapeutic intervention in order to improve the following deficits and impairments:  Abnormal gait, Decreased balance, Decreased endurance, Difficulty walking, Decreased strength  Visit Diagnosis: Muscle weakness (generalized) - Plan: PT plan of care cert/re-cert  Other lack of coordination - Plan: PT plan of care  cert/re-cert  Difficulty in walking, not elsewhere classified - Plan: PT plan of care cert/re-cert     Problem List There are no active problems to display for this patient.  Thatcher Doberstein M Mardie Kellen, SPT This entire session was performed under direct supervision and direction of a licensed thChiropractor I have personally read, edited and approve of the note as written.  Trotter,Margaret PT, DPT 07/19/2017, 5:39 PM   [place] [facility] [place] [id number]: [phone] Fax:  [phone]: MiDELLAMAE ROSAMILIARN: [phone] of Birth: 9/04-13-1997

## 2017-07-19 NOTE — Therapy (Signed)
[place] MAIN [facility] SERVICES [address] [place], [place], [address] Phone: [phone]   Fax:  [phone]  Occupational Therapy Treatment  Patient Details  Name: Michelle Randall MRN: [id number] Date of Birth: 11-27-1995 No Data Recorded  Encounter Date: 07/19/2017      OT End of Session - 07/19/17 0856    Visit Number 90   Number of Visits 106   Date for OT Re-Evaluation 09/09/17   Authorization Type medicaid visit 43 of 106   OT Start Time 0845   OT Stop Time 0930   OT Time Calculation (min) 45 min   Activity Tolerance Patient tolerated treatment well   Behavior During Therapy [facility] for tasks assessed/performed      History reviewed. No pertinent past medical history.  History reviewed. No pertinent surgical history.  There were no vitals filed for this visit.      Subjective Assessment - 07/19/17 0851    Subjective  Patient's sister is expecting a child and had her gender reveal party this past weekend.  Nephews had a birthday party yesterday so she had a lot of family here for the festivities.    Patient Stated Goals To be as independent as possible at home and at school.    Currently in Pain? No/denies   Pain Score 0-No pain                      OT Treatments/Exercises (OP) - 07/19/17 1506      Fine Motor Coordination   Other Fine Motor Exercises Patient seen for fine motor coordination activities to help develop skills to manipulate small objects such as buttons, snaps an zippers.  Patient picking up small glass beads and moving to the palm and back to fingertips to place into container.  Increased ease of task when beads are face up but has more difficulty when flat side is down on table. Patient picking up toothpicks with use of resistive tweezers in right hand and placing toothpicks into small holed container.  Cues provided for technique and prehension patterns.      Neurological Re-education Exercises    Other Exercises 1 Patient seen for ROM/reaching tasks with 2# wrist weight on each wrist and placing and removing SAEBO looped balls on 4 level tower with mild difficulty to place items at top level, requires some weight shifting and extended reaching from seated position.  Patient completed 2 full sets up and down 4 levels on each side then 1 set of top to bottom movement with cues. Rest breaks as needed during task.                       OT Long Term Goals - 06/14/17 1547      OT LONG TERM GOAL #1   Title Patient will demonstrate increased strength in right hand sufficient to scoop ice cream from frozen container with modified independence.   Baseline goal update:  patient now able to complete with minimal assist at times.   Time 12   Period Months   Status On-going     OT LONG TERM GOAL #2   Title Patient will be able to make her bed with modified independence.   Baseline she continues to have difficulty with balance and completing this task, increased time, minimal assist at goal update   Time 6   Period Months   Status On-going     OT LONG TERM GOAL #  3   Title Patient will demonstrate the ability to perform vacuuming of one room with modified independence using light weight vacuum.   Baseline moderate assist at goal update.   Time 6   Period Months   Status On-going     OT LONG TERM GOAL #4   Title Patient will demonstrate the ability to open containers with sealed tops such as on yogurt containers and individual ice cream containers.    Baseline goal update:  occasional assistance required but able to complete more than 50% of the time with modified independence.    Time 12   Period Weeks   Status Partially Met     OT LONG TERM GOAL #5   Title Patient will demonstrate the ability to perform small buttons on shirts independently and with good speed in order to dress for school.   Baseline goal update: Patient can perform with increased time but occasionally  needs assist if rushed.   Time 12   Period Weeks   Status Partially Met     OT LONG TERM GOAL #6   Title Patient will donn and doff small earrings with modified independence.   Baseline Goal update:  able to hold small backs and dropping only 25% of the time, slow to complete but improving.  Min assist    Time 12   Period Weeks   Status Partially Met     OT LONG TERM GOAL #7   Title Patient will be able to zip her rainboots while they are on her feet with modified independence.     Baseline moderate assist to don   Time 12   Period Weeks   Status On-going     OT LONG TERM GOAL #8   Title Patient to complete am self care demonstrating efficient methods of dressing skills to get to school on time.    Baseline Goal update:  patient continues to work towards efficiency and was late 1 time in the last 2 weeks.   Time 12   Period Weeks   Status Partially Met     OT LONG TERM GOAL  #9   Baseline Patient will don/doff socks and AFO braces with modified independence using adaptive equipment as needed.  (Baseline min assist)   Time 12   Period Weeks   Status New     OT LONG TERM GOAL  #10   TITLE Patient will demonstrate the ability to pull jeans down over AFOs with modified independence.    Baseline moderate assist.   Time 12   Period Weeks   Status New     OT LONG TERM GOAL  #11   TITLE Will be able to squeeze tooth paste out of the tube when it is less than half full.   Baseline difficulty with small travel sized, may look into alternative ways since this has been a chronic issue.   Time 6   Period Months   Status Achieved     OT LONG TERM GOAL  #12   TITLE Patient will demonstrate managing wheelchair rain poncho in inclement weather with modified independence.   Baseline crutch tips are difficult for anyone to take them off, goal deferred.   Time 6   Period Months   Status Achieved     OT LONG TERM GOAL  #13   TITLE Patient will be able to complete 2 1/2 minutes  straight with left shoulder flexion simulating driving with a stearing wheel ball to be able to drive  to college.    Baseline Patient has not been driving, her family takes her to school and drops her off.    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    Baseline difficulty coordinating the use of both hands to complete task.    Status Partially Met     OT LONG TERM GOAL  #15   TITLE Patient will be able to obtain food tray in the cafeteria from a wheelchair level and take to the table without spilling with modified independence.    Baseline max assist   Time 6   Period Months   Status Achieved               Plan - 07/19/17 0856    Clinical Impression Statement Patient working towards speed and dexterity with manipulation of small items from tabletop, increased ease with items which have a shape, more difficulty with items which are flat to table. Patient able to use 2# on wrists for repeated reaching tasks in multiple directions and heights.  Continue to work towards improving ROM, strength and coordination to perform home, work and school tasks.    Rehab Potential Good   OT Frequency 1x / week   OT Duration 12 weeks   OT Treatment/Interventions Self-care/ADL training;Therapeutic exercise;Therapeutic exercises;Therapeutic activities;DME and/or AE instruction;Patient/family education   Consulted and Agree with Plan of Care Patient      Patient will benefit from skilled therapeutic intervention in order to improve the following deficits and impairments:  Impaired UE functional use, Difficulty walking, Decreased strength, Decreased coordination, Decreased endurance, Decreased balance  Visit Diagnosis: Muscle weakness (generalized)  Other lack of coordination    Problem List There are no active  problems to display for this patient.  Achilles Dunk, OTR/L, CLT  Britiny Defrain 07/19/2017, 3:16 PM  [place] MAIN [facility] SERVICES [address] [place], [place], [address] Phone: [phone]   Fax:  [phone]  Name: BETSY ROSELLO MRN: [id number] Date of Birth: 1996/02/09

## 2017-07-28 ENCOUNTER — Encounter: Payer: Self-pay | Admitting: Occupational Therapy

## 2017-07-28 ENCOUNTER — Ambulatory Visit: Payer: Medicaid Other | Admitting: Occupational Therapy

## 2017-07-28 ENCOUNTER — Ambulatory Visit: Payer: Medicaid Other

## 2017-07-28 DIAGNOSIS — M6281 Muscle weakness (generalized): Secondary | ICD-10-CM

## 2017-07-28 DIAGNOSIS — R278 Other lack of coordination: Secondary | ICD-10-CM

## 2017-07-28 DIAGNOSIS — R262 Difficulty in walking, not elsewhere classified: Secondary | ICD-10-CM

## 2017-07-28 NOTE — Therapy (Signed)
[place] MAIN [facility] SERVICES [address] [place], [place], [address] Phone: [phone]   Fax:  [phone]  Physical Therapy Treatment  Patient Details  Name: Michelle Randall MRN: [id number] Date of Birth: [birth year] Referring Provider: Langley Gauss MD  Encounter Date: 07/28/2017      PT End of Session - 07/28/17 1125    Visit Number 99   Number of Visits 110   Date for PT Re-Evaluation 10/11/17   Authorization Type 1/12   Authorization Time Period 12 visits 9/13-12/5   PT Start Time 1033   PT Stop Time 1115   PT Time Calculation (min) 42 min   Equipment Utilized During Treatment Gait belt   Activity Tolerance Patient tolerated treatment well   Behavior During Therapy [facility] for tasks assessed/performed      History reviewed. No pertinent past medical history.  History reviewed. No pertinent surgical history.  There were no vitals filed for this visit.      Subjective Assessment - 07/28/17 1123    Subjective Patient reports being compliant with treadmill work, performing treadmill 5 min after school days and 10 mins on weekends. Her dad just had knee replacement surgery so she is tired from the change.    Patient is accompained by: Family member   Limitations Walking   Patient Stated Goals Patient wants to improve her core strength.    Currently in Pain? No/denies         Plank 3x 30 seconds,  Prone press on elbows between plank: correction for hinging lumbar, curving thoracic spine 3x60 seconds -painful to lumbar prior to correction, non painful after correction Prone superman UE only for thoracic curvature 10x with 5 second holds. Good scapular control  Robber stretch supine (arms 90 90) 2x90 seconds   Seated on swiss ball   Static balance: 60 seconds  UE raises 15x each arm  Push pull with PVC pipe 20x ( row at end, bench at start)  6 cone lateral transfers from side to side 4x, good control of lateral  abdominals with no LOB  Patient required min-mod cueing for postural control in supine and CGA with swiss ball interventions.                           PT Education - 07/28/17 1124    Education provided Yes   Education Details core stability utilizing swiss ball, prone press without lumbar pain   Person(s) Educated Patient   Methods Explanation;Demonstration;Verbal cues   Comprehension Returned demonstration;Verbalized understanding             PT Long Term Goals - 07/19/17 0825      PT LONG TERM GOAL #1   Title Patient will improve Dynamic Gait Index (DGI) score to > 21/24 for meaningful improvement and low falls risk regarding dynamic walking tasks (revised from > 19/24 )   Baseline 14/24; 11/02/16: 15/24 01/28/17: 18/24 6/5: 19/24   Time 12   Period Weeks   Status Revised     PT LONG TERM GOAL #2   Title Patient will increase Berg Balance score by >51/56 points to be considered a low risk for falls for improved safety. (revised from >6 points improvement)   Baseline 11/02/16: 34/56 01/28/17: 39/56 6/5: 45/56   Time 12   Period Weeks   Status Revised     PT LONG TERM GOAL #3   Title Patient will be able to transfer  in and out of a large car with  a high seat with CGA  to improve ability to go to school/doctor visits.    Baseline Patient requires min A for transfer into large SUV unless it has a step rail on the side   Time 12   Period Weeks   Status On-going     PT LONG TERM GOAL #4   Title Patient will complete a TUG test in < 12 seconds for independent mobility and decreased fall risk    Baseline 11.45   Time 12   Period Weeks   Status Achieved     PT LONG TERM GOAL #5   Title Patient will improve 6 minute walk distance by > 150 ft for improved return to functional community activities    Baseline 740 01/28/2017: 713f 6/5: 795  730 on 07/19/17   Time 12   Status On-going     PT LONG TERM GOAL #6   Title Patient will improve gait speed to >  1.2 m/s with least restrictive assistive device to return to normal walking speed    Baseline .76 m/h 01/28/2017: .87  07/19/17 0.63m   Time 12   Period Weeks   Status On-going     PT LONG TERM GOAL #7   Title Patient (< 6066ears old) will complete five times sit to stand test in < 10 seconds indicating an increased LE strength and improved balance    Baseline 9.45 sec   Time 12   Period Weeks   Status Achieved     PT LONG TERM GOAL #8   Title Patient will be able to ambulate on inclines and grass independenlty with LRAD   Baseline Can ambulate over grassy inclines with decreased speed and safety requiring need loftstrand curtches and SBA   Time 12   Period Weeks   Status On-going     PT LONG TERM GOAL  #9   TITLE Patient will be abe to transfer from low chair or stool without UE support independently   Baseline Patient needs min A assist to transfer from a low stool   Period Weeks   Status Partially Met     PT LONG TERM GOAL  #10   TITLE Patient will be able to transfer from the floor to standing independently with use of LRAD   Baseline Patient able to stand from the floor independently if she has not been on the floor longer than about 5 min due to stiffness with prolonged positioning   Time 12   Period Weeks   Status On-going     PT LONG TERM GOAL  #11   TITLE Patient will step up onto a 6" step 5x with AD independently to increase community mobility   Baseline Pt. has increased difficulty requiring supervision for safety, but no physical assist   Time 12   Period Weeks   Status Partially Met               Plan - 07/28/17 1128    Clinical Impression Statement Patient demonstrated good core control with static balance on swiss ball and was challenged by dynamic motion in all planes, especially side to side. Prone press was performed with correction to body mechanics to decrease strain on lumbar region and increase stretch of thoracic region with good compliance  with alterations. Patient will continue to benefit from skilled physical therapy to maximize her functional mobility and safety to increase her community access with as much independence  as possible.    Rehab Potential Good   Clinical Impairments Affecting Rehab Potential weakness and decreased standing balance   PT Frequency 1x / week   PT Duration 12 weeks   PT Treatment/Interventions Therapeutic exercise;Therapeutic activities;Gait training;Balance training;Stair training;DME Instruction;Neuromuscular re-education;Patient/family education   PT Next Visit Plan Continue POC   PT Home Exercise Plan Continued from previous sessions.    Consulted and Agree with Plan of Care Patient      Patient will benefit from skilled therapeutic intervention in order to improve the following deficits and impairments:  Abnormal gait, Decreased balance, Decreased endurance, Difficulty walking, Decreased strength  Visit Diagnosis: Muscle weakness (generalized)  Other lack of coordination  Difficulty in walking, not elsewhere classified     Problem List There are no active problems to display for this patient.  Janna Arch, PT, DPT   Janna Arch 07/28/2017, 11:29 AM  [place] MAIN [facility] SERVICES [address] [place], [place], [address] Phone: [phone]   Fax:  [phone]  Name: Michelle Randall MRN: [id number] Date of Birth: [birth year]

## 2017-07-29 NOTE — Therapy (Signed)
[place] MAIN [facility] SERVICES [address] [place], [place], [address] Phone: [phone]   Fax:  [phone]  Occupational Therapy Treatment  Patient Details  Name: Michelle Randall MRN: [id number] Date of Birth: 04-02-1996 No Data Recorded  Encounter Date: 07/28/2017      OT End of Session - 07/29/17 0846    Visit Number 91   Number of Visits 106   Date for OT Re-Evaluation 09/09/17   Authorization Type medicaid visit 60 of 106   OT Start Time 1115   OT Stop Time 1200   OT Time Calculation (min) 45 min   Activity Tolerance Patient tolerated treatment well   Behavior During Therapy [facility] for tasks assessed/performed      History reviewed. No pertinent past medical history.  History reviewed. No pertinent surgical history.  There were no vitals filed for this visit.      Subjective Assessment - 07/28/17 1121    Subjective  Patient reports [employer] cancelled class for the rest of the week and many students ended up evacuating and going home. Only 4 people in her class yesterday.   Patient Stated Goals To be as independent as possible at home and at school.    Currently in Pain? No/denies   Pain Score 0-No pain                      OT Treatments/Exercises (OP) - 07/29/17 4481      Fine Motor Coordination   Other Fine Motor Exercises Patient seen for manipulation of coins from tabletop and placing into resistive bank, cues for translatory skills of the hand and using the hand for storage as well as prehension patterns.      Neurological Re-education Exercises   Other Exercises 1 Patient seen for UB reciprocal strengthening exercises with mild resistance for 6 minutes with therapist in constant attendance to ensure grip, pace and changes in resistance.  Patient performed resistive grip tasks with 17# for 25 repetitions, 1# hammer for supination/pronation with cues for hand placement and technique.  Patient performing multi  directional reaching from seated position with cues for weight shifting, end range reaching.                  OT Education - 07/29/17 0845    Education provided Yes   Education Details manipulation of coins, using hand for storage, translatory skills   Person(s) Educated Patient   Methods Explanation;Demonstration   Comprehension Verbalized understanding;Returned demonstration             OT Long Term Goals - 06/14/17 1547      OT LONG TERM GOAL #1   Title Patient will demonstrate increased strength in right hand sufficient to scoop ice cream from frozen container with modified independence.   Baseline goal update:  patient now able to complete with minimal assist at times.   Time 12   Period Months   Status On-going     OT LONG TERM GOAL #2   Title Patient will be able to make her bed with modified independence.   Baseline she continues to have difficulty with balance and completing this task, increased time, minimal assist at goal update   Time 6   Period Months   Status On-going     OT LONG TERM GOAL #3   Title Patient will demonstrate the ability to perform vacuuming of one room with modified independence using light weight vacuum.   Baseline moderate  assist at goal update.   Time 6   Period Months   Status On-going     OT LONG TERM GOAL #4   Title Patient will demonstrate the ability to open containers with sealed tops such as on yogurt containers and individual ice cream containers.    Baseline goal update:  occasional assistance required but able to complete more than 50% of the time with modified independence.    Time 12   Period Weeks   Status Partially Met     OT LONG TERM GOAL #5   Title Patient will demonstrate the ability to perform small buttons on shirts independently and with good speed in order to dress for school.   Baseline goal update: Patient can perform with increased time but occasionally needs assist if rushed.   Time 12   Period  Weeks   Status Partially Met     OT LONG TERM GOAL #6   Title Patient will donn and doff small earrings with modified independence.   Baseline Goal update:  able to hold small backs and dropping only 25% of the time, slow to complete but improving.  Min assist    Time 12   Period Weeks   Status Partially Met     OT LONG TERM GOAL #7   Title Patient will be able to zip her rainboots while they are on her feet with modified independence.     Baseline moderate assist to don   Time 12   Period Weeks   Status On-going     OT LONG TERM GOAL #8   Title Patient to complete am self care demonstrating efficient methods of dressing skills to get to school on time.    Baseline Goal update:  patient continues to work towards efficiency and was late 1 time in the last 2 weeks.   Time 12   Period Weeks   Status Partially Met     OT LONG TERM GOAL  #9   Baseline Patient will don/doff socks and AFO braces with modified independence using adaptive equipment as needed.  (Baseline min assist)   Time 12   Period Weeks   Status New     OT LONG TERM GOAL  #10   TITLE Patient will demonstrate the ability to pull jeans down over AFOs with modified independence.    Baseline moderate assist.   Time 12   Period Weeks   Status New     OT LONG TERM GOAL  #11   TITLE Will be able to squeeze tooth paste out of the tube when it is less than half full.   Baseline difficulty with small travel sized, may look into alternative ways since this has been a chronic issue.   Time 6   Period Months   Status Achieved     OT LONG TERM GOAL  #12   TITLE Patient will demonstrate managing wheelchair rain poncho in inclement weather with modified independence.   Baseline crutch tips are difficult for anyone to take them off, goal deferred.   Time 6   Period Months   Status Achieved     OT LONG TERM GOAL  #13   TITLE Patient will be able to complete 2 1/2 minutes straight with left shoulder flexion simulating  driving with a stearing wheel ball to be able to drive to college.    Baseline Patient has not been driving, her family takes her to school and drops her off.    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    Baseline difficulty coordinating the use of both hands to complete task.    Status Partially Met     OT LONG TERM GOAL  #15   TITLE Patient will be able to obtain food tray in the cafeteria from a wheelchair level and take to the table without spilling with modified independence.    Baseline max assist   Time 6   Period Months   Status Achieved               Plan - 07/29/17 0847    Clinical Impression Statement Patient fatigued this date, her dad had surgery for a total knee replacement and she reports they have not gotten a lot of sleep so she does not feel her best today.  She continues to work towards improving her strength, ROM and coordination.  Cues for translatory skills of the hand and using the hand for storage with tasks.  Continue to work towards goals to increase independence in daily tasks.    Rehab Potential Good   OT Frequency 1x / week   OT Duration 12 weeks   OT Treatment/Interventions Self-care/ADL training;Therapeutic exercise;Therapeutic exercises;Therapeutic activities;DME and/or AE instruction;Patient/family education   Consulted and Agree with Plan of Care Patient      Patient will benefit from skilled therapeutic intervention in order to improve the following deficits and impairments:  Impaired UE functional use, Difficulty walking, Decreased strength, Decreased coordination, Decreased endurance, Decreased balance  Visit Diagnosis: Muscle weakness (generalized)  Other lack of coordination    Problem List There are no active problems to display for this patient.  Achilles Dunk, OTR/L, CLT  Felicidad Sugarman 07/29/2017, 9:47 AM  [place] MAIN [facility] SERVICES [place], [place], [address] Phone: [phone]   Fax:  [phone]  Name: Michelle Randall MRN: [id number] Date of Birth: 08/31/1996

## 2017-08-01 ENCOUNTER — Ambulatory Visit: Payer: Medicaid Other | Admitting: Occupational Therapy

## 2017-08-01 ENCOUNTER — Encounter: Payer: Self-pay | Admitting: Physical Therapy

## 2017-08-01 ENCOUNTER — Ambulatory Visit: Payer: Medicaid Other | Admitting: Physical Therapy

## 2017-08-01 DIAGNOSIS — R278 Other lack of coordination: Secondary | ICD-10-CM

## 2017-08-01 DIAGNOSIS — M6281 Muscle weakness (generalized): Secondary | ICD-10-CM | POA: Diagnosis not present

## 2017-08-01 DIAGNOSIS — R262 Difficulty in walking, not elsewhere classified: Secondary | ICD-10-CM
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
## 2017-08-01 NOTE — Patient Instructions (Addendum)
Bird Dog Pose - Intermediate    Keep pelvis stable. From table pose, step one leg back to plank pose. Reach opposite arm forward, back foot off floor.  Hold for _3-4___ breaths. Return arm and leg at same rate. Repeat ___10_ times, alternating sides.  Copyright  VHI. All rights reserved.

## 2017-08-01 NOTE — Therapy (Signed)
[place] MAIN [facility] SERVICES [address] [place], [place], [address] Phone: [phone]   Fax:  [phone]  Physical Therapy Treatment  Patient Details  Name: Michelle Randall MRN: [id number] Date of Birth: 02-Oct-1996 Referring Provider: Langley Gauss MD  Encounter Date: 08/01/2017      PT End of Session - 08/01/17 2032    Visit Number 100   Number of Visits 110   Date for PT Re-Evaluation 10/11/17   Authorization Type 2   Authorization Time Period 12 visits 9/13-12/5   PT Start Time 1301   PT Stop Time 1344   PT Time Calculation (min) 43 min   Equipment Utilized During Treatment Gait belt   Activity Tolerance Patient tolerated treatment well;No increased pain   Behavior During Therapy [facility] for tasks assessed/performed      History reviewed. No pertinent past medical history.  History reviewed. No pertinent surgical history.  There were no vitals filed for this visit.      Subjective Assessment - 08/01/17 1303    Subjective Patient reports she is doing well; no falls since last session and no pain currently.    Patient is accompained by: Family member   Limitations Walking   Patient Stated Goals Patient wants to improve her core strength.    Currently in Pain? No/denies   Pain Onset In the past 7 days                   Treatment:  Ther-ex:  Qped;  Bird dog                            Double contralateral limb raise alternating x 10 cues to raise LE all the way off the table; cues to increase LE elevation to clear table, especially on RLE; pt abel to respond initially with increased AROM until fatigue increased at end of exercise.  Prone   Superman BUE x 10 with 5 sec hold; pt with good form; encouragement to increase trunk elevation for increased muscle activation.   Elbow planks 3 x 30 seconds cues to lower hips slightly for better posture and increased core activation, cues lower pelvis slightly  for better postural alignment.   Sidelying:   Side planks 2 x 20 seconds each side; minA in R sidelying for stacking feet; cues to raise hips slightly higher for better core activation    Sitting   Leg press  BLE X 12 at 75# with cues to avoid terminal knee ext and minA for foot placement     X 12 at 90#, pt reports the weight is not too difficult; pt able to perform with good form     Single leg 1 x 15 at 30#; pt cued to avoid genu valgus and minA for foot positioning to avoid hip external rotation on RLE; pt able to maintain good form on LLE without cueing.   Standing in parallel bars:   Sidestepping x 5 laps in parallel bars, yellow t-band attempted, but discontinued due to limited AROM due to hip abd weakness. Pt cued to take larger steps with good response   Step-ups to 5" step x 5 leading with each LEwith forearm crutches; pt initially unable to step up and required cues to center her RLE on the step and to bring crutches closer to her while also rocking forward to increase forward momentum. Stepping down pt repeatedly went into severe genu valgus  as hip externally rotated. Pt cued to avoid this by maintaining foot forward with step down, but pt unable to control hip external rotation due to weakness.            PT Education - 08/01/17 1313    Education provided Yes   Education Details Ther-ex; HEP advanced   Person(s) Educated Patient   Methods Explanation;Demonstration;Tactile cues;Verbal cues   Comprehension Verbalized understanding;Returned demonstration;Verbal cues required;Tactile cues required             PT Long Term Goals - 07/19/17 0825      PT LONG TERM GOAL #1   Title Patient will improve Dynamic Gait Index (DGI) score to > 21/24 for meaningful improvement and low falls risk regarding dynamic walking tasks (revised from > 19/24 )   Baseline 14/24; 11/02/16: 15/24 01/28/17: 18/24 6/5: 19/24   Time 12   Period Weeks   Status Revised     PT LONG TERM GOAL #2    Title Patient will increase Berg Balance score by >51/56 points to be considered a low risk for falls for improved safety. (revised from >6 points improvement)   Baseline 11/02/16: 34/56 01/28/17: 39/56 6/5: 45/56   Time 12   Period Weeks   Status Revised     PT LONG TERM GOAL #3   Title Patient will be able to transfer in and out of a large car with  a high seat with CGA  to improve ability to go to school/doctor visits.    Baseline Patient requires min A for transfer into large SUV unless it has a step rail on the side   Time 12   Period Weeks   Status On-going     PT LONG TERM GOAL #4   Title Patient will complete a TUG test in < 12 seconds for independent mobility and decreased fall risk    Baseline 11.45   Time 12   Period Weeks   Status Achieved     PT LONG TERM GOAL #5   Title Patient will improve 6 minute walk distance by > 150 ft for improved return to functional community activities    Baseline 740 01/28/2017: 744f 6/5: 795  730 on 07/19/17   Time 12   Status On-going     PT LONG TERM GOAL #6   Title Patient will improve gait speed to > 1.2 m/s with least restrictive assistive device to return to normal walking speed    Baseline .76 m/h 01/28/2017: .87  07/19/17 0.650m   Time 12   Period Weeks   Status On-going     PT LONG TERM GOAL #7   Title Patient (< 6034ears old) will complete five times sit to stand test in < 10 seconds indicating an increased LE strength and improved balance    Baseline 9.45 sec   Time 12   Period Weeks   Status Achieved     PT LONG TERM GOAL #8   Title Patient will be able to ambulate on inclines and grass independenlty with LRAD   Baseline Can ambulate over grassy inclines with decreased speed and safety requiring need loftstrand curtches and SBA   Time 12   Period Weeks   Status On-going     PT LONG TERM GOAL  #9   TITLE Patient will be abe to transfer from low chair or stool without UE support independently   Baseline Patient needs  min A assist to transfer from a low stool  Period Weeks   Status Partially Met     PT LONG TERM GOAL  #10   TITLE Patient will be able to transfer from the floor to standing independently with use of LRAD   Baseline Patient able to stand from the floor independently if she has not been on the floor longer than about 5 min due to stiffness with prolonged positioning   Time 12   Period Weeks   Status On-going     PT LONG TERM GOAL  #11   TITLE Patient will step up onto a 6" step 5x with AD independently to increase community mobility   Baseline Pt. has increased difficulty requiring supervision for safety, but no physical assist   Time 12   Period Weeks   Status Partially Met               Plan - 08/01/17 2033    Clinical Impression Statement Pt instructed in therapeutic exercise including core strengthening exercise, and functional strengthening for sit<>stand and for step-ups on stairs/curbs. Pt remains limited with step ups with minor instability and max difficulty with a 5" step in the parallel bars. Pt may benefit from alternative step up method to increase community access. Pt will continue to benefit from skilled therapy in order to maximize functional mobility and safety.   Rehab Potential Good   Clinical Impairments Affecting Rehab Potential weakness and decreased standing balance   PT Frequency 1x / week   PT Duration 12 weeks   PT Treatment/Interventions Therapeutic exercise;Therapeutic activities;Gait training;Balance training;Stair training;DME Instruction;Neuromuscular re-education;Patient/family education   PT Next Visit Plan Continue POC   PT Home Exercise Plan Continued from previous sessions.    Consulted and Agree with Plan of Care Patient      Patient will benefit from skilled therapeutic intervention in order to improve the following deficits and impairments:  Abnormal gait, Decreased balance, Decreased endurance, Difficulty walking, Decreased  strength  Visit Diagnosis: Muscle weakness (generalized)  Other lack of coordination  Difficulty in walking, not elsewhere classified     Problem List There are no active problems to display for this patient.  Aedan Geimer M Keani Gotcher, SPT This entire session was performed under direct supervision and direction of a licensed Chiropractor . I have personally read, edited and approve of the note as written.  Trotter,Margaret PT, DPT 08/02/2017, 9:45 AM  [place] MAIN [facility] SERVICES [address] [place], [place], [address] Phone: [phone]   Fax:  [phone]  Name: Michelle Randall MRN: [id number] Date of Birth: 16-Oct-1996

## 2017-08-02 ENCOUNTER — Ambulatory Visit: Payer: Medicaid Other | Admitting: Occupational Therapy

## 2017-08-02 ENCOUNTER — Ambulatory Visit: Payer: Medicaid Other | Admitting: Physical Therapy

## 2017-08-02 DIAGNOSIS — R41840 Attention and concentration deficit: Secondary | ICD-10-CM | POA: Insufficient documentation

## 2017-08-03 ENCOUNTER — Encounter: Payer: Medicaid Other | Admitting: Occupational Therapy

## 2017-08-09 ENCOUNTER — Ambulatory Visit: Payer: Medicaid Other | Admitting: Physical Therapy

## 2017-08-11 ENCOUNTER — Ambulatory Visit: Payer: Medicaid Other | Admitting: Physical Therapy

## 2017-08-11 ENCOUNTER — Encounter: Payer: Self-pay | Admitting: Occupational Therapy

## 2017-08-11 ENCOUNTER — Encounter: Payer: Self-pay | Admitting: Physical Therapy

## 2017-08-11 ENCOUNTER — Ambulatory Visit: Payer: Medicaid Other | Admitting: Occupational Therapy

## 2017-08-11 DIAGNOSIS — M6281 Muscle weakness (generalized): Secondary | ICD-10-CM | POA: Diagnosis not present

## 2017-08-11 DIAGNOSIS — R262 Difficulty in walking, not elsewhere classified: Secondary | ICD-10-CM

## 2017-08-11 DIAGNOSIS — R278 Other lack of coordination: Secondary | ICD-10-CM

## 2017-08-11 NOTE — Therapy (Signed)
[name] [facility] SERVICES [address] [place], [place], [address] Phone: [phone]   Fax:  [phone]  Physical Therapy Treatment  Patient Details  Name: Michelle Randall MRN: [id number] Date of Birth: [birth year] Referring Provider: Langley Gauss MD  Encounter Date: 08/11/2017      PT End of Session - 08/11/17 1108    Visit Number 101   Number of Visits 110   Date for PT Re-Evaluation 10/11/17   Authorization Type 3/12   Authorization Time Period 12 visits 9/13-12/5   PT Start Time 1102   PT Stop Time 1145   PT Time Calculation (min) 43 min   Equipment Utilized During Treatment Gait belt   Activity Tolerance Patient tolerated treatment well   Behavior During Therapy [facility] for tasks assessed/performed      History reviewed. No pertinent past medical history.  History reviewed. No pertinent surgical history.  There were no vitals filed for this visit.      Subjective Assessment - 08/11/17 1106    Subjective No reports of pain today and states that she has been walking on treadmill at home.   Patient is accompained by: Family member   Limitations Walking   Patient Stated Goals Patient wants to improve her core strength.    Currently in Pain? No/denies   Pain Score 0-No pain   Pain Onset In the past 7 days     Treatment:  Bridges with assistance keeping feet on mat x 15  SLR x 15 B LE's  Supine hip ABD with ER x 20 - cues for form and to move through available ROM  Attempted sidelying hip ABD and clamshells - unable to lift against gravity  Seated dying bugs 2 x 15 each UE/LE - cues for form   Seated diagonal chops with red theraball x 15 ea. Way  LE marches sitting on swissball x 20 B LE's  Core twists with red theraball seated on swissball x 20 each way  Supine UE and LE lowers x 15 each - cues for proper form  Planks on elbows 4 x 30 sec with cues for proper alignment during hold  Scap retraction  seated on swiss ball with 7.5# x 20  Unilateral scap retraction seated on swissball with 7.3# x 15                              PT Education - 08/11/17 1107    Education Details HEP    Person(s) Educated Patient   Methods Explanation   Comprehension Verbalized understanding             PT Long Term Goals - 07/19/17 0825      PT LONG TERM GOAL #1   Title Patient will improve Dynamic Gait Index (DGI) score to > 21/24 for meaningful improvement and low falls risk regarding dynamic walking tasks (revised from > 19/24 )   Baseline 14/24; 11/02/16: 15/24 01/28/17: 18/24 6/5: 19/24   Time 12   Period Weeks   Status Revised     PT LONG TERM GOAL #2   Title Patient will increase Berg Balance score by >51/56 points to be considered a low risk for falls for improved safety. (revised from >6 points improvement)   Baseline 11/02/16: 34/56 01/28/17: 39/56 6/5: 45/56   Time 12   Period Weeks   Status Revised     PT LONG TERM GOAL #3  Title Patient will be able to transfer in and out of a large car with  a high seat with CGA  to improve ability to go to school/doctor visits.    Baseline Patient requires min A for transfer into large SUV unless it has a step rail on the side   Time 12   Period Weeks   Status On-going     PT LONG TERM GOAL #4   Title Patient will complete a TUG test in < 12 seconds for independent mobility and decreased fall risk    Baseline 11.45   Time 12   Period Weeks   Status Achieved     PT LONG TERM GOAL #5   Title Patient will improve 6 minute walk distance by > 150 ft for improved return to functional community activities    Baseline 740 01/28/2017: 763f 6/5: 795  730 on 07/19/17   Time 12   Status On-going     PT LONG TERM GOAL #6   Title Patient will improve gait speed to > 1.2 m/s with least restrictive assistive device to return to normal walking speed    Baseline .76 m/h 01/28/2017: .87  07/19/17 0.649m   Time 12   Period Weeks    Status On-going     PT LONG TERM GOAL #7   Title Patient (< 6086ears old) will complete five times sit to stand test in < 10 seconds indicating an increased LE strength and improved balance    Baseline 9.45 sec   Time 12   Period Weeks   Status Achieved     PT LONG TERM GOAL #8   Title Patient will be able to ambulate on inclines and grass independenlty with LRAD   Baseline Can ambulate over grassy inclines with decreased speed and safety requiring need loftstrand curtches and SBA   Time 12   Period Weeks   Status On-going     PT LONG TERM GOAL  #9   TITLE Patient will be abe to transfer from low chair or stool without UE support independently   Baseline Patient needs min A assist to transfer from a low stool   Period Weeks   Status Partially Met     PT LONG TERM GOAL  #10   TITLE Patient will be able to transfer from the floor to standing independently with use of LRAD   Baseline Patient able to stand from the floor independently if she has not been on the floor longer than about 5 min due to stiffness with prolonged positioning   Time 12   Period Weeks   Status On-going     PT LONG TERM GOAL  #11   TITLE Patient will step up onto a 6" step 5x with AD independently to increase community mobility   Baseline Pt. has increased difficulty requiring supervision for safety, but no physical assist   Time 12   Period Weeks   Status Partially Met               Plan - 08/11/17 1250    Clinical Impression Statement Pt responded well to all interventions today.  Focus was placed on core and hip strengthening in order to improve balance and ambulation.  Pt attempted hip ABD and clamshells against gravity, but was unable to perform against resistance of gravity secondary to weakness.  Pt was able to complete core exercises, requiring cueing for proper form and hip alignment during seated and supine core activities. Pt would continue  to benefit from skilled therapy services in  order to improve LE and core strength, gait and functional mobility in order to decrease risk of falls and improve independence with mobility.   Rehab Potential Good   Clinical Impairments Affecting Rehab Potential weakness and decreased standing balance   PT Frequency 1x / week   PT Duration 12 weeks   PT Treatment/Interventions Therapeutic exercise;Therapeutic activities;Gait training;Balance training;Stair training;DME Instruction;Neuromuscular re-education;Patient/family education   PT Next Visit Plan Continue POC   PT Home Exercise Plan Continued from previous sessions.    Consulted and Agree with Plan of Care Patient      Patient will benefit from skilled therapeutic intervention in order to improve the following deficits and impairments:  Abnormal gait, Decreased balance, Decreased endurance, Difficulty walking, Decreased strength  Visit Diagnosis: Muscle weakness (generalized)  Other lack of coordination  Difficulty in walking, not elsewhere classified     Problem List There are no active problems to display for this patient.  This entire session was performed under direct supervision and direction of a licensed therapist/therapist assistant . I have personally read, edited and approve of the note as written. Netta Corrigan, SPT Alanson Puls, PT, DPT  08/11/2017, 12:55 PM  [place] MAIN [facility] SERVICES [address] [place], [place], [address] Phone: [phone]   Fax:  [phone]  Name: Michelle Randall MRN: [id number] Date of Birth: 1996/09/09

## 2017-08-13 NOTE — Therapy (Signed)
[place] MAIN [facility] SERVICES [address] [place], [place], [address] Phone: [phone]   Fax:  [phone]  Occupational Therapy Treatment  Patient Details  Name: Michelle Randall MRN: [id number] Date of Birth: 1996-05-03 No Data Recorded  Encounter Date: 08/11/2017      OT End of Session - 08/13/17 1116    Visit Number 93   Number of Visits 106   Date for OT Re-Evaluation 09/09/17   Authorization Type medicaid visit 69 of 106   OT Start Time 1015   OT Stop Time 1100   OT Time Calculation (min) 45 min   Activity Tolerance Patient tolerated treatment well   Behavior During Therapy [facility] for tasks assessed/performed      History reviewed. No pertinent past medical history.  History reviewed. No pertinent surgical history.  There were no vitals filed for this visit.                    OT Treatments/Exercises (OP) - 08/13/17 1254      ADLs   ADL Comments Patient was seen for instruction of three different types of zipper pulls which may help with managing zippers at home. Will plan to try T-bar zipper pull for next session with rain boots if patient brings her boots in to the clinic. Patient also has difficulty with managing jewelry, especially small earrings with very small backs.      Neurological Re-education Exercises   Other Exercises 1 Patient seen for finger strengthening with both upper extremities with the manipulation of pushpins into a moderate resistance board. Patient had difficulty with utilizing pushpins at school this week with a project during class time. Manipulation of small beads from tabletop and placing onto small grid, removing with scissored forceps.                 OT Education - 08/13/17 1116    Education provided Yes   Education Details zipper pull   Person(s) Educated Patient   Methods Explanation;Demonstration;Verbal cues   Comprehension Verbal cues required;Returned  demonstration;Verbalized understanding             OT Long Term Goals - 08/13/17 1119      OT LONG TERM GOAL #1   Title Patient will demonstrate increased strength in right hand sufficient to scoop ice cream from frozen container with modified independence.   Baseline goal update:  patient now able to complete with minimal assist at times.   Time 12   Period Months   Status On-going     OT LONG TERM GOAL #2   Title Patient will be able to make her bed with modified independence.   Baseline she continues to have difficulty with balance and completing this task, increased time, minimal assist at goal update   Time 6   Period Months   Status On-going     OT LONG TERM GOAL #3   Title Patient will demonstrate the ability to perform vacuuming of one room with modified independence using light weight vacuum.   Baseline moderate assist at goal update.   Time 6   Period Months   Status On-going     OT LONG TERM GOAL #4   Title Patient will demonstrate the ability to open containers with sealed tops such as on yogurt containers and individual ice cream containers.    Baseline goal update:  occasional assistance required but able to complete more than 50% of the time with modified independence.  Time 12   Period Weeks   Status Achieved     OT LONG TERM GOAL #5   Title Patient will demonstrate the ability to perform small buttons on shirts independently and with good speed in order to dress for school.   Baseline goal update: Patient can perform with increased time but occasionally needs assist if rushed.   Time 12   Period Weeks   Status Partially Met     OT LONG TERM GOAL #6   Title Patient will donn and doff small earrings with modified independence.   Baseline Goal update:  able to hold small backs and dropping only 25% of the time, slow to complete but improving.  Min assist    Time 12   Period Weeks   Status Partially Met     OT LONG TERM GOAL #7   Title Patient will  be able to zip her rainboots while they are on her feet with modified independence.     Baseline moderate assist to don   Time 12   Period Weeks   Status On-going     OT LONG TERM GOAL #8   Title Patient to complete am self care demonstrating efficient methods of dressing skills to get to school on time.    Baseline Goal update:  patient continues to work towards efficiency and was late 1 time in the last 2 weeks.   Time 12   Period Weeks   Status Partially Met     OT LONG TERM GOAL  #9   Baseline Patient will don/doff socks and AFO braces with modified independence using adaptive equipment as needed.  (Baseline min assist)   Time 12   Period Weeks   Status New     OT LONG TERM GOAL  #10   TITLE Patient will demonstrate the ability to pull jeans down over AFOs with modified independence.    Baseline moderate assist.   Time 12   Period Weeks   Status New     OT LONG TERM GOAL  #11   TITLE Will be able to squeeze tooth paste out of the tube when it is less than half full.   Baseline difficulty with small travel sized, may look into alternative ways since this has been a chronic issue.   Time 6   Period Months   Status Achieved     OT LONG TERM GOAL  #12   TITLE Patient will demonstrate managing wheelchair rain poncho in inclement weather with modified independence.   Baseline crutch tips are difficult for anyone to take them off, goal deferred.   Time 6   Period Months   Status Achieved     OT LONG TERM GOAL  #13   TITLE Patient will be able to complete 2 1/2 minutes straight with left shoulder flexion simulating driving with a stearing wheel ball to be able to drive to college.    Baseline Patient has not been driving, her family takes her to school and drops her off.    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    Baseline difficulty coordinating the use of both hands to complete task.    Status Partially Met     OT LONG TERM GOAL  #  15   TITLE Patient will be able to obtain food tray in the cafeteria from a wheelchair level and take to the table without spilling with modified independence.    Baseline max assist   Time 6   Period Months   Status Achieved               Plan - 08/13/17 1117    Clinical Impression Statement Patient has continued to progress with tasks towards goals. She still has difficulty with putting on her rain boots in managing the zippers once they are on her feet and would like to try a zipper pull. She will plan to bring her rain boots next session. She also has difficulty with managing small earrings for therapist to analyze task. Continue to work towards goals to increase Independence in necessary daily tasks.    Rehab Potential Good   OT Frequency 1x / week   OT Duration 12 weeks   OT Treatment/Interventions Self-care/ADL training;Therapeutic exercise;Therapeutic exercises;Therapeutic activities;DME and/or AE instruction;Patient/family education   Consulted and Agree with Plan of Care Patient      Patient will benefit from skilled therapeutic intervention in order to improve the following deficits and impairments:  Impaired UE functional use, Difficulty walking, Decreased strength, Decreased coordination, Decreased endurance, Decreased balance  Visit Diagnosis: Muscle weakness (generalized)  Other lack of coordination    Problem List There are no active problems to display for this patient.  Achilles Dunk, OTR/L, CLT  Jay Kempe 08/13/2017, 12:56 PM  [place] MAIN [facility] SERVICES [address] [place], [place], [address] Phone: [phone]   Fax:  [phone]  Name: COLIE JOSTEN MRN: [id number] Date of Birth: 1996-03-31

## 2017-08-16 ENCOUNTER — Ambulatory Visit: Payer: Medicaid Other | Admitting: Occupational Therapy

## 2017-08-16 ENCOUNTER — Ambulatory Visit: Payer: Medicaid Other | Attending: Pediatrics | Admitting: Physical Therapy

## 2017-08-16 ENCOUNTER — Encounter: Payer: Self-pay | Admitting: Physical Therapy

## 2017-08-16 ENCOUNTER — Encounter: Payer: Self-pay | Admitting: Occupational Therapy

## 2017-08-16 DIAGNOSIS — R262 Difficulty in walking, not elsewhere classified: Secondary | ICD-10-CM

## 2017-08-16 DIAGNOSIS — R278 Other lack of coordination: Secondary | ICD-10-CM | POA: Insufficient documentation

## 2017-08-16 DIAGNOSIS — M6281 Muscle weakness (generalized): Secondary | ICD-10-CM

## 2017-08-16 DIAGNOSIS — R279 Unspecified lack of coordination: Secondary | ICD-10-CM | POA: Diagnosis present

## 2017-08-16 NOTE — Therapy (Signed)
[place] MAIN [facility] SERVICES [address] [place], [place], [address] Phone: [phone]   Fax:  [phone]  Physical Therapy Treatment  Patient Details  Name: Michelle Randall MRN: [id number] Date of Birth: 28-Jan-1996 Referring Provider: Langley Gauss MD  Encounter Date: 08/16/2017      PT End of Session - 08/16/17 0938    Visit Number 102   Number of Visits 110   Date for PT Re-Evaluation 10/11/17   Authorization Type 4/12   Authorization Time Period 12 visits 9/13-12/5   PT Start Time 0929   PT Stop Time 1015   PT Time Calculation (min) 46 min   Equipment Utilized During Treatment Gait belt   Activity Tolerance Patient tolerated treatment well   Behavior During Therapy [facility] for tasks assessed/performed      History reviewed. No pertinent past medical history.  History reviewed. No pertinent surgical history.  There were no vitals filed for this visit.      Subjective Assessment - 08/16/17 0931    Subjective Pt reports no pain today, states that she performed her new HEP exercises without increase soreness or pain.   Patient is accompained by: Family member   Limitations Walking   Patient Stated Goals Patient wants to improve her core strength.    Currently in Pain? No/denies   Pain Score 0-No pain   Pain Onset In the past 7 days     Treatment:  Hooklying bridges x 20 with PT assistance to stabilize feet  Hooklying abdominal brace x 15 with 10 sec hold  Hooklying abdominal brace with hip ABD/ER x 20   Supine marches holding TA contraction x 30 reps - cues for form  Dying bugs x 20 UE and LE lowers - cues for form  LE 6 in static hold with TA contraction x 10 with 10 sec hold - cues for form and technique   Supine hip abd slides x 20 B LE's - cues to achieve full ROM  Supine crunches with UE overhead reach to touching knees x 20   Seated scap retractions with red theraband x 20 reps with 3 sec hold  UE  flexion with opposite LE marches seated on swissball x 20 each - CGA for safety and tactile cues for pelvis alignment  Sit to stand from mat table 2 x 20    CGA and  mod verbal cues used throughout . Patient needs cues for technique and posture.                       PT Education - 08/17/17 1049    Education provided Yes   Education Details HEP    Person(s) Educated Patient   Methods Explanation;Demonstration   Comprehension Verbalized understanding;Returned demonstration             PT Long Term Goals - 08/17/17 1052      PT LONG TERM GOAL #1   Title Patient will improve Dynamic Gait Index (DGI) score to > 21/24 for meaningful improvement and low falls risk regarding dynamic walking tasks (revised from > 19/24 )   Baseline 14/24; 11/02/16: 15/24 01/28/17: 18/24 6/5: 19/24   Time 12   Period Weeks   Status On-going   Target Date 10/11/17     PT LONG TERM GOAL #2   Title Patient will increase Berg Balance score by >51/56 points to be considered a low risk for falls for improved safety. (revised from >6  points improvement)   Baseline 11/02/16: 34/56 01/28/17: 39/56 6/5: 45/56   Time 12   Period Weeks   Status On-going   Target Date 10/11/17     PT LONG TERM GOAL #3   Title Patient will be able to transfer in and out of a large car with  a high seat with CGA  to improve ability to go to school/doctor visits.    Baseline Patient requires min A for transfer into large SUV unless it has a step rail on the side   Time 12   Period Weeks   Status On-going   Target Date 10/11/17     PT LONG TERM GOAL #4   Title Patient will complete a TUG test in < 12 seconds for independent mobility and decreased fall risk    Baseline 11.45   Time 12   Period Weeks   Status Achieved   Target Date 10/11/17     PT LONG TERM GOAL #5   Title Patient will improve 6 minute walk distance by > 150 ft for improved return to functional community activities    Baseline 740  01/28/2017: 739f 6/5: 795  730 on 07/19/17   Time 12   Status On-going   Target Date 10/11/17     PT LONG TERM GOAL #6   Title Patient will improve gait speed to > 1.2 m/s with least restrictive assistive device to return to normal walking speed    Baseline .76 m/h 01/28/2017: .87  07/19/17 0.631m   Time 12   Period Weeks   Status On-going  10/11/17 target date     PT LONG TERM GOAL #7   Title Patient (< 8904ears old) will complete five times sit to stand test in < 10 seconds indicating an increased LE strength and improved balance    Baseline 9.45 sec   Time 12   Period Weeks   Status Achieved     PT LONG TERM GOAL #8   Title Patient will be able to ambulate on inclines and grass independenlty with LRAD   Baseline Can ambulate over grassy inclines with decreased speed and safety requiring need loftstrand curtches and SBA   Time 12   Period Weeks   Status On-going  10/11/17 target date     PT LONG TERM GOAL  #9   TITLE Patient will be abe to transfer from low chair or stool without UE support independently   Baseline Patient needs min A assist to transfer from a low stool   Time 12   Period Weeks   Status Partially Met  10/11/17 target date     PT LONG TERM GOAL  #10   TITLE Patient will be able to transfer from the floor to standing independently with use of LRAD   Baseline Patient able to stand from the floor independently if she has not been on the floor longer than about 5 min due to stiffness with prolonged positioning   Time 12   Period Weeks   Status On-going  10/11/17 target date     PT LONG TERM GOAL  #11   TITLE Patient will step up onto a 6" step 5x with AD independently to increase community mobility   Baseline Pt. has increased difficulty requiring supervision for safety, but no physical assist   Time 12   Period Weeks   Status Partially Met  10/11/17 target date               Plan -  08/16/17 0952    Clinical Impression Statement Pt continues to  progress well through LE and core strength progressions.  Pt continues to exhibit weakness of core musculature and B hip abd muscule groups.  Pt was encouraged to continue performing HEP, focusing on hip and core muscles to further increase strength in order to improve standing and ambulation stability.  Pt would continue to benfit from skilled PT services in order to further improve core and LE strength, as well as core stability in order to decrease overall risk of falls and improve functional mobiltiy.   Rehab Potential Good   Clinical Impairments Affecting Rehab Potential weakness and decreased standing balance   PT Frequency 1x / week   PT Duration 12 weeks   PT Treatment/Interventions Therapeutic exercise;Therapeutic activities;Gait training;Balance training;Stair training;DME Instruction;Neuromuscular re-education;Patient/family education   PT Next Visit Plan Continue POC   PT Home Exercise Plan Continued from previous sessions.    Consulted and Agree with Plan of Care Patient      Patient will benefit from skilled therapeutic intervention in order to improve the following deficits and impairments:  Abnormal gait, Decreased balance, Decreased endurance, Difficulty walking, Decreased strength  Visit Diagnosis: Muscle weakness (generalized)  Difficulty in walking, not elsewhere classified     Problem List There are no active problems to display for this patient.  This entire session was performed under direct supervision and direction of a licensed therapist/therapist assistant . I have personally read, edited and approve of the note as written. Netta Corrigan, SPT Alanson Puls, PT, DPT  08/17/2017, 11:04 AM  [place] MAIN [facility] SERVICES [address] [place], [place], [address] Phone: [phone]   Fax:  [phone]  Name: Michelle Randall MRN: [id number] Date of Birth: [birth year]

## 2017-08-17 NOTE — Therapy (Signed)
[place] MAIN [facility] SERVICES [address] [place], [place], [address] Phone: [phone]   Fax:  [phone]  Physical Therapy Treatment  Patient Details  Name: Michelle Randall MRN: [id number] Date of Birth: December 08, 1995 Referring Provider: Langley Gauss MD  Encounter Date: 08/16/2017      PT End of Session - 08/16/17 0938    Visit Number 102   Number of Visits 110   Date for PT Re-Evaluation 10/11/17   Authorization Type 4/12   Authorization Time Period 12 visits 9/13-12/5   PT Start Time 0929   PT Stop Time 1015   PT Time Calculation (min) 46 min   Equipment Utilized During Treatment Gait belt   Activity Tolerance Patient tolerated treatment well   Behavior During Therapy [facility] for tasks assessed/performed      History reviewed. No pertinent past medical history.  History reviewed. No pertinent surgical history.  There were no vitals filed for this visit.      Subjective Assessment - 08/16/17 0931    Subjective Pt reports no pain today, states that she performed her new HEP exercises without increase soreness or pain.   Patient is accompained by: Family member   Limitations Walking   Patient Stated Goals Patient wants to improve her core strength.    Currently in Pain? No/denies   Pain Score 0-No pain   Pain Onset In the past 7 days                                 PT Education - 08/17/17 1049    Education provided Yes   Education Details HEP    Person(s) Educated Patient   Methods Explanation;Demonstration   Comprehension Verbalized understanding;Returned demonstration             PT Long Term Goals - 08/17/17 1052      PT LONG TERM GOAL #1   Title Patient will improve Dynamic Gait Index (DGI) score to > 21/24 for meaningful improvement and low falls risk regarding dynamic walking tasks (revised from > 19/24 )   Baseline 14/24; 11/02/16: 15/24 01/28/17: 18/24 6/5: 19/24   Time 12   Period Weeks   Status On-going   Target Date 10/11/17     PT LONG TERM GOAL #2   Title Patient will increase Berg Balance score by >51/56 points to be considered a low risk for falls for improved safety. (revised from >6 points improvement)   Baseline 11/02/16: 34/56 01/28/17: 39/56 6/5: 45/56   Time 12   Period Weeks   Status On-going   Target Date 10/11/17     PT LONG TERM GOAL #3   Title Patient will be able to transfer in and out of a large car with  a high seat with CGA  to improve ability to go to school/doctor visits.    Baseline Patient requires min A for transfer into large SUV unless it has a step rail on the side   Time 12   Period Weeks   Status On-going   Target Date 10/11/17     PT LONG TERM GOAL #4   Title Patient will complete a TUG test in < 12 seconds for independent mobility and decreased fall risk    Baseline 11.45   Time 12   Period Weeks   Status Achieved   Target Date 10/11/17     PT LONG TERM GOAL #5  Title Patient will improve 6 minute walk distance by > 150 ft for improved return to functional community activities    Baseline 740 01/28/2017: 769f 6/5: 795  730 on 07/19/17   Time 12   Status On-going   Target Date 10/11/17     PT LONG TERM GOAL #6   Title Patient will improve gait speed to > 1.2 m/s with least restrictive assistive device to return to normal walking speed    Baseline .76 m/h 01/28/2017: .87  07/19/17 0.634m   Time 12   Period Weeks   Status On-going  10/11/17 target date     PT LONG TERM GOAL #7   Title Patient (< [age]ears old) will complete five times sit to stand test in < 10 seconds indicating an increased LE strength and improved balance    Baseline 9.45 sec   Time 12   Period Weeks   Status Achieved     PT LONG TERM GOAL #8   Title Patient will be able to ambulate on inclines and grass independenlty with LRAD   Baseline Can ambulate over grassy inclines with decreased speed and safety requiring need loftstrand curtches and  SBA   Time 12   Period Weeks   Status On-going  10/11/17 target date     PT LONG TERM GOAL  #9   TITLE Patient will be abe to transfer from low chair or stool without UE support independently   Baseline Patient needs min A assist to transfer from a low stool   Time 12   Period Weeks   Status Partially Met  10/11/17 target date     PT LONG TERM GOAL  #10   TITLE Patient will be able to transfer from the floor to standing independently with use of LRAD   Baseline Patient able to stand from the floor independently if she has not been on the floor longer than about 5 min due to stiffness with prolonged positioning   Time 12   Period Weeks   Status On-going  10/11/17 target date     PT LONG TERM GOAL  #11   TITLE Patient will step up onto a 6" step 5x with AD independently to increase community mobility   Baseline Pt. has increased difficulty requiring supervision for safety, but no physical assist   Time 12   Period Weeks   Status Partially Met  10/11/17 target date               Plan - 08/16/17 [id number]  Clinical Impression Statement Pt continues to progress well through LE and core strength progressions.  Pt continues to exhibit weakness of core musculature and B hip abd muscule groups.  Pt was encouraged to continue performing HEP, focusing on hip and core muscles to further increase strength in order to improve standing and ambulation stability.  Pt would continue to benfit from skilled PT services in order to further improve core and LE strength, as well as core stability in order to decrease overall risk of falls and improve functional mobiltiy.   Rehab Potential Good   Clinical Impairments Affecting Rehab Potential weakness and decreased standing balance   PT Frequency 1x / week   PT Duration 12 weeks   PT Treatment/Interventions Therapeutic exercise;Therapeutic activities;Gait training;Balance training;Stair training;DME Instruction;Neuromuscular  re-education;Patient/family education   PT Next Visit Plan Continue POC   PT Home Exercise Plan Continued from previous sessions.    Consulted and Agree with Plan of Care Patient  Patient will benefit from skilled therapeutic intervention in order to improve the following deficits and impairments:  Abnormal gait, Decreased balance, Decreased endurance, Difficulty walking, Decreased strength  Visit Diagnosis: Muscle weakness (generalized)  Difficulty in walking, not elsewhere classified     Problem List There are no active problems to display for this patient.   Alanson Puls 08/17/2017, 11:04 AM  [place] MAIN [facility] SERVICES [address] [place], [place], [address] Phone: [phone]   Fax:  [phone]  Name: JONICA BICKHART MRN: [id number] Date of Birth: 02-12-1996

## 2017-08-22 NOTE — Therapy (Signed)
[place] MAIN [facility] SERVICES [address] [place], [place], [address] Phone: [phone]   Fax:  [phone]  Occupational Therapy Treatment  Patient Details  Name: Michelle Randall MRN: [id number] Date of Birth: [birth year] No Data Recorded  Encounter Date: 08/16/2017      OT End of Session - 08/22/17 0832    Visit Number 94   Number of Visits 106   Date for OT Re-Evaluation 09/09/17   Authorization Type medicaid visit 76 of 106   OT Start Time 1015   OT Stop Time 1100   OT Time Calculation (min) 45 min   Activity Tolerance Patient tolerated treatment well   Behavior During Therapy [facility] for tasks assessed/performed      History reviewed. No pertinent past medical history.  History reviewed. No pertinent surgical history.  There were no vitals filed for this visit.      Subjective Assessment - 08/21/17 0829    Subjective  Patient forgot to bring in her rainboots this date, she will set a reminder on her phone for next time.     Patient Stated Goals To be as independent as possible at home and at school.    Currently in Pain? No/denies   Pain Score 0-No pain                      OT Treatments/Exercises (OP) - 08/21/17 1324      ADLs   ADL Comments Patient seen for tying, knotting and unknotting with laces and nylon rope with moderate resistance and use of bilateral UE to complete tasks.  Cues for prehension patterns when trying to unknot more difficult tighter knots.      Fine Motor Coordination   Other Fine Motor Exercises Patient seen for fine motor coordination tasks with manipulation of 100 pegboard pieces with focus on speed and dexterity of movements, dropping 3.  Manipulation of small 1/2 sized objects from resistive putty with 2 containers combined for increased resistance and effort.                  OT Education - 08/22/17 [phone]    Education provided Yes   Education Details knotting/unknotting  prehension patterns and skills.    Person(s) Educated Patient   Methods Explanation;Demonstration;Verbal cues   Comprehension Verbal cues required;Returned demonstration;Verbalized understanding             OT Long Term Goals - 08/13/17 1119      OT LONG TERM GOAL #1   Title Patient will demonstrate increased strength in right hand sufficient to scoop ice cream from frozen container with modified independence.   Baseline goal update:  patient now able to complete with minimal assist at times.   Time 12   Period Months   Status On-going     OT LONG TERM GOAL #2   Title Patient will be able to make her bed with modified independence.   Baseline she continues to have difficulty with balance and completing this task, increased time, minimal assist at goal update   Time 6   Period Months   Status On-going     OT LONG TERM GOAL #3   Title Patient will demonstrate the ability to perform vacuuming of one room with modified independence using light weight vacuum.   Baseline moderate assist at goal update.   Time 6   Period Months   Status On-going     OT LONG TERM GOAL #4  Title Patient will demonstrate the ability to open containers with sealed tops such as on yogurt containers and individual ice cream containers.    Baseline goal update:  occasional assistance required but able to complete more than 50% of the time with modified independence.    Time 12   Period Weeks   Status Achieved     OT LONG TERM GOAL #5   Title Patient will demonstrate the ability to perform small buttons on shirts independently and with good speed in order to dress for school.   Baseline goal update: Patient can perform with increased time but occasionally needs assist if rushed.   Time 12   Period Weeks   Status Partially Met     OT LONG TERM GOAL #6   Title Patient will donn and doff small earrings with modified independence.   Baseline Goal update:  able to hold small backs and dropping only  25% of the time, slow to complete but improving.  Min assist    Time 12   Period Weeks   Status Partially Met     OT LONG TERM GOAL #7   Title Patient will be able to zip her rainboots while they are on her feet with modified independence.     Baseline moderate assist to don   Time 12   Period Weeks   Status On-going     OT LONG TERM GOAL #8   Title Patient to complete am self care demonstrating efficient methods of dressing skills to get to school on time.    Baseline Goal update:  patient continues to work towards efficiency and was late 1 time in the last 2 weeks.   Time 12   Period Weeks   Status Partially Met     OT LONG TERM GOAL  #9   Baseline Patient will don/doff socks and AFO braces with modified independence using adaptive equipment as needed.  (Baseline min assist)   Time 12   Period Weeks   Status New     OT LONG TERM GOAL  #10   TITLE Patient will demonstrate the ability to pull jeans down over AFOs with modified independence.    Baseline moderate assist.   Time 12   Period Weeks   Status New     OT LONG TERM GOAL  #11   TITLE Will be able to squeeze tooth paste out of the tube when it is less than half full.   Baseline difficulty with small travel sized, may look into alternative ways since this has been a chronic issue.   Time 6   Period Months   Status Achieved     OT LONG TERM GOAL  #12   TITLE Patient will demonstrate managing wheelchair rain poncho in inclement weather with modified independence.   Baseline crutch tips are difficult for anyone to take them off, goal deferred.   Time 6   Period Months   Status Achieved     OT LONG TERM GOAL  #13   TITLE Patient will be able to complete 2 1/2 minutes straight with left shoulder flexion simulating driving with a stearing wheel ball to be able to drive to college.    Baseline Patient has not been driving, her family takes her to school and drops her off.    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    Baseline difficulty coordinating the use of both hands to complete task.    Status Partially Met     OT LONG TERM GOAL  #15   TITLE Patient will be able to obtain food tray in the cafeteria from a wheelchair level and take to the table without spilling with modified independence.    Baseline max assist   Time 6   Period Months   Status Achieved               Plan - 08/22/17 7225    Clinical Impression Statement Patient demonstrates some increased difficulty with knotting/unknotting tasks when knots are tighter and material is smaller.  Cues for prehension patterns and use of bilateral UE to complete task.  Patient continues to work on speed and dexterity of tasks to improve hand skills for tasks at school.  Patient made an alarm for her phone to remind her of bringing in her rainboots next session since she forgot them today.  Will continue to work towards tasks to improve independence in daily activities.    Rehab Potential Good   OT Frequency 1x / week   OT Duration 12 weeks   OT Treatment/Interventions Self-care/ADL training;Therapeutic exercise;Therapeutic exercises;Therapeutic activities;DME and/or AE instruction;Patient/family education   Consulted and Agree with Plan of Care Patient      Patient will benefit from skilled therapeutic intervention in order to improve the following deficits and impairments:  Impaired UE functional use, Difficulty walking, Decreased strength, Decreased coordination, Decreased endurance, Decreased balance  Visit Diagnosis: Muscle weakness (generalized)  Other lack of coordination    Problem List There are no active problems to display for this patient.  Achilles Dunk, OTR/L, CLT  Lovett,Amy 08/22/2017, 8:36 AM  [place]  MAIN [facility] SERVICES [address] [place], [place], [address] Phone: [phone]   Fax:  [phone]  Name: Michelle Randall MRN: [id number] Date of Birth: 1996/04/29

## 2017-08-23 ENCOUNTER — Encounter: Payer: Self-pay | Admitting: Physical Therapy

## 2017-08-23 ENCOUNTER — Ambulatory Visit: Payer: Medicaid Other | Admitting: Physical Therapy

## 2017-08-23 ENCOUNTER — Ambulatory Visit: Payer: Medicaid Other | Admitting: Occupational Therapy

## 2017-08-23 ENCOUNTER — Encounter: Payer: Self-pay | Admitting: Occupational Therapy

## 2017-08-23 DIAGNOSIS — R279 Unspecified lack of coordination: Secondary | ICD-10-CM

## 2017-08-23 DIAGNOSIS — M6281 Muscle weakness (generalized): Secondary | ICD-10-CM

## 2017-08-23 DIAGNOSIS — R262 Difficulty in walking, not elsewhere classified: Secondary | ICD-10-CM

## 2017-08-23 NOTE — Therapy (Signed)
[place] MAIN [facility] SERVICES [address] [place], [place], [address] Phone: [phone]   Fax:  [phone]  Occupational Therapy Treatment  Patient Details  Name: Michelle Randall MRN: [id number] Date of Birth: 18-Jul-1996 No Data Recorded  Encounter Date: 08/23/2017      OT End of Session - 08/23/17 1939    Visit Number 95   Number of Visits 106   Date for OT Re-Evaluation 09/09/17   Authorization Type medicaid visit 77 of 106   OT Start Time 1100   OT Stop Time 1146   OT Time Calculation (min) 46 min   Activity Tolerance Patient tolerated treatment well   Behavior During Therapy [facility] for tasks assessed/performed      History reviewed. No pertinent past medical history.  History reviewed. No pertinent surgical history.  There were no vitals filed for this visit.      Subjective Assessment - 08/23/17 1918    Subjective  Patient reports she was going to bring in her rainboots and her dad helped her to try them on yesterday and realized since she got new AFOs, her rainboots will no longer fit over the braces.  Will have to get new set of rainboots in larger size.    Patient Stated Goals To be as independent as possible at home and at school.    Currently in Pain? No/denies   Pain Score 0-No pain                      OT Treatments/Exercises (OP) - 08/23/17 1937      Fine Motor Coordination   Other Fine Motor Exercises Patient seen for manipulation of pieces of [place] (washers, collars and dowels) placed into a magnetic bowl for added resistance to challenge finger strength and coordination skills, emphasis on speed and dexterity when manipulating pieces.      Neurological Re-education Exercises   Other Exercises 1 Bilateral hand strengthening tasks with sustained gripping with resistive hand gripper on 4th setting of 23.4# for 25 reps each hand.  Resistive pinch pins, all levels completed placing onto elevated plane  in various directions for extended reach, bilateral upper extremities.                OT Education - 08/23/17 1939    Education provided Yes   Education Details home exercises for fine motor coordination and hand strength   Person(s) Educated Patient   Methods Explanation;Demonstration;Verbal cues   Comprehension Verbal cues required;Returned demonstration;Verbalized understanding             OT Long Term Goals - 08/13/17 1119      OT LONG TERM GOAL #1   Title Patient will demonstrate increased strength in right hand sufficient to scoop ice cream from frozen container with modified independence.   Baseline goal update:  patient now able to complete with minimal assist at times.   Time 12   Period Months   Status On-going     OT LONG TERM GOAL #2   Title Patient will be able to make her bed with modified independence.   Baseline she continues to have difficulty with balance and completing this task, increased time, minimal assist at goal update   Time 6   Period Months   Status On-going     OT LONG TERM GOAL #3   Title Patient will demonstrate the ability to perform vacuuming of one room with modified independence using light weight vacuum.  Baseline moderate assist at goal update.   Time 6   Period Months   Status On-going     OT LONG TERM GOAL #4   Title Patient will demonstrate the ability to open containers with sealed tops such as on yogurt containers and individual ice cream containers.    Baseline goal update:  occasional assistance required but able to complete more than 50% of the time with modified independence.    Time 12   Period Weeks   Status Achieved     OT LONG TERM GOAL #5   Title Patient will demonstrate the ability to perform small buttons on shirts independently and with good speed in order to dress for school.   Baseline goal update: Patient can perform with increased time but occasionally needs assist if rushed.   Time 12   Period  Weeks   Status Partially Met     OT LONG TERM GOAL #6   Title Patient will donn and doff small earrings with modified independence.   Baseline Goal update:  able to hold small backs and dropping only 25% of the time, slow to complete but improving.  Min assist    Time 12   Period Weeks   Status Partially Met     OT LONG TERM GOAL #7   Title Patient will be able to zip her rainboots while they are on her feet with modified independence.     Baseline moderate assist to don   Time 12   Period Weeks   Status On-going     OT LONG TERM GOAL #8   Title Patient to complete am self care demonstrating efficient methods of dressing skills to get to school on time.    Baseline Goal update:  patient continues to work towards efficiency and was late 1 time in the last 2 weeks.   Time 12   Period Weeks   Status Partially Met     OT LONG TERM GOAL  #9   Baseline Patient will don/doff socks and AFO braces with modified independence using adaptive equipment as needed.  (Baseline min assist)   Time 12   Period Weeks   Status New     OT LONG TERM GOAL  #10   TITLE Patient will demonstrate the ability to pull jeans down over AFOs with modified independence.    Baseline moderate assist.   Time 12   Period Weeks   Status New     OT LONG TERM GOAL  #11   TITLE Will be able to squeeze tooth paste out of the tube when it is less than half full.   Baseline difficulty with small travel sized, may look into alternative ways since this has been a chronic issue.   Time 6   Period Months   Status Achieved     OT LONG TERM GOAL  #12   TITLE Patient will demonstrate managing wheelchair rain poncho in inclement weather with modified independence.   Baseline crutch tips are difficult for anyone to take them off, goal deferred.   Time 6   Period Months   Status Achieved     OT LONG TERM GOAL  #13   TITLE Patient will be able to complete 2 1/2 minutes straight with left shoulder flexion simulating  driving with a stearing wheel ball to be able to drive to college.    Baseline Patient has not been driving, her family takes her to school and drops her off.    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    Baseline difficulty coordinating the use of both hands to complete task.    Status Partially Met     OT LONG TERM GOAL  #15   TITLE Patient will be able to obtain food tray in the cafeteria from a wheelchair level and take to the table without spilling with modified independence.    Baseline max assist   Time 6   Period Months   Status Achieved               Plan - 08/23/17 1940    Clinical Impression Statement Patient was not able to bring in her rain boots to work on donning and managing zippers since she cannot fit them over her new AFOs.  She is planning to purchase another set of rain boots in a larger size and will bring in once she receives them.  She continues to work towards improving pinch skills to hold zipper and strength with pulling on zippers.  Patient continues to benefit from skilled OT to maximize her safety an independence in daily tasks.    Rehab Potential Good   OT Frequency 1x / week   OT Duration 12 weeks   OT Treatment/Interventions Self-care/ADL training;Therapeutic exercise;Therapeutic exercises;Therapeutic activities;DME and/or AE instruction;Patient/family education   Consulted and Agree with Plan of Care Patient      Patient will benefit from skilled therapeutic intervention in order to improve the following deficits and impairments:  Impaired UE functional use, Difficulty walking, Decreased strength, Decreased coordination, Decreased endurance, Decreased balance  Visit Diagnosis: Muscle weakness (generalized)  Lack of coordination    Problem List There are no  active problems to display for this patient.  Achilles Dunk, OTR/L, CLT  Quentin Strebel 08/23/2017, 7:43 PM  [place] MAIN [facility] SERVICES [address] [place], [place], [address] Phone: [phone]   Fax:  [phone]  Name: Michelle Randall MRN: [id number] Date of Birth: Jul 12, 1996

## 2017-08-23 NOTE — Therapy (Addendum)
[place] MAIN [facility] SERVICES [address] [place], [place], [address] Phone: [phone]   Fax:  [phone]  Physical Therapy Treatment  Patient Details  Name: Michelle Randall MRN: [id number] Date of Birth: Feb 16, 1996 Referring Provider: Langley Gauss MD  Encounter Date: 08/23/2017      PT End of Session - 08/23/17 1016    Visit Number 103   Number of Visits 110   Date for PT Re-Evaluation 10/11/17   Authorization Type 5/12   Authorization Time Period 12 visits 9/13-12/5   PT Start Time 1015   PT Stop Time 1058   PT Time Calculation (min) 43 min   Equipment Utilized During Treatment Gait belt   Activity Tolerance Patient tolerated treatment well   Behavior During Therapy [facility] for tasks assessed/performed      History reviewed. No pertinent past medical history.  History reviewed. No pertinent surgical history.  There were no vitals filed for this visit.      Subjective Assessment - 08/23/17 1014    Subjective Pt states that she is having no pain today.  She states that she has been doing HEP everynight.  She states that she feels she is progressing well and getting stronger.  She states she still has difficulty with toe clearance during walking, getting in and out of high cars for transfers, getting up off the floor that is a traction-reduced surface like a tile floor.   Patient is accompained by: Family member   Limitations Walking   Patient Stated Goals Patient wants to improve her core strength.    Currently in Pain? No/denies   Pain Score 0-No pain   Pain Onset In the past 7 days     Treatment: Goal reassessment was completed today:  5x sit to stand: 9.63 seconds   TUG: 13.66 sec  72mn WT: 790 ft  12mT: 0.77 m/s  DGI: 14/24 indicating increased fall risk  BERG: 45/56 indicating 2.5x higher risk for falls  Ambulation on grass: SBA for safety, decreased gait speed, increased need for UE support on loft strand  crutches  Ambulation up incline: SBA for safety, decreased gait speed, decreased clearance of toes during swing phase on B LE's, increased use of UE for balance on loft strand crutches                         PT Education - 08/23/17 1016    Education provided Yes   Education Details continuing HEP   Person(s) Educated Patient   Methods Explanation;Demonstration   Comprehension Verbalized understanding;Returned demonstration             PT Long Term Goals - 08/23/17 1112      PT LONG TERM GOAL #1   Title Patient will improve Dynamic Gait Index (DGI) score to > 21/24 for meaningful improvement and low falls risk regarding dynamic walking tasks (revised from > 19/24 )   Baseline 14/24; 11/02/16: 15/24 01/28/17: 18/24 6/5: 19/24; 08/23/17 14/24   Time 12   Period Weeks   Status On-going   Target Date 10/11/17     PT LONG TERM GOAL #2   Title Patient will increase Berg Balance score by >51/56 points to be considered a low risk for falls for improved safety. (revised from >6 points improvement)   Baseline 11/02/16: 34/56 01/28/17: 39/56 6/5: 45/56; 08/23/2017 = 45/56   Time 12   Period Weeks   Status On-going  Target Date 10/11/17     PT LONG TERM GOAL #3   Title Patient will be able to transfer in and out of a large car with  a high seat with CGA  to improve ability to go to school/doctor visits.    Baseline Patient requires min A for transfer into large SUV unless it has a step rail on the side   Time 12   Period Weeks   Status On-going   Target Date 10/11/17     PT LONG TERM GOAL #4   Title Patient will complete a TUG test in < 12 seconds for independent mobility and decreased fall risk    Baseline 11.45; 08/23/2017 = 13.66 sec   Time 12   Period Weeks   Status Partially Met   Target Date 10/11/17     PT LONG TERM GOAL #5   Title Patient will improve 6 minute walk distance by > 150 ft for improved return to functional community activities     Baseline 740 01/28/2017: 715f 6/5: 795  730 on 07/19/17; 790 ft on 08/23/18   Time 12   Status Partially Met   Target Date 10/11/17     PT LONG TERM GOAL #6   Title Patient will improve gait speed to > 1.2 m/s with least restrictive assistive device to return to normal walking speed    Baseline .76 m/h 01/28/2017: .87  07/19/17 0.665m; 0.77 m/s on 08/23/17   Time 12   Period Weeks   Status On-going  10/11/17 target date     PT LONG TERM GOAL #7   Title Patient (< 6060ears old) will complete five times sit to stand test in < 10 seconds indicating an increased LE strength and improved balance    Baseline 9.45 sec; 9.63 on 08/23/17   Time 12   Period Weeks   Status Achieved     PT LONG TERM GOAL #8   Title Patient will be able to ambulate on inclines and grass independenlty with LRAD   Baseline Can ambulate over grassy inclines with decreased speed and safety requiring need loftstrand curtches and SBA   Time 12   Period Weeks   Status On-going  10/11/17 target date     PT LONG TERM GOAL  #9   TITLE Patient will be abe to transfer from low chair or stool without UE support independently   Baseline Patient needs min A assist to transfer from a low stool   Time 12   Period Weeks   Status Partially Met  10/11/17 target date     PT LONG TERM GOAL  #10   TITLE Patient will be able to transfer from the floor to standing independently with use of LRAD   Baseline Patient able to stand from the floor independently if she has not been on the floor longer than about 5 min due to stiffness with prolonged positioning   Time 12   Period Weeks   Status On-going  10/11/17 target date     PT LONG TERM GOAL  #11   TITLE Patient will step up onto a 6" step 5x with AD independently to increase community mobility   Baseline Pt. has increased difficulty requiring supervision for safety, but no physical assist   Time 12   Period Weeks   Status Partially Met  10/11/17 target date                Plan - 08/23/17 1108    Clinical  Impression Statement Goals were reassesed during today's session.  Pt demonstrates increased falls risk as based off DGI and Berg Balance assessment.  Pt demonstrates improved endurance with increase in the 6 min walk test distance and improved gait speed.  When ambulating on uneven surfaces and up inclines, pt demonstrates increased need for UE use on loft-strand crutches for balance.  Pt was able to tolerate assessments without increased pain and without increased fatigue.  Pt would conitnue to benefit from skilled therapy services in order to decrease falls risk, improve LE strength and core strength and improve mobility.   Rehab Potential Good   Clinical Impairments Affecting Rehab Potential weakness and decreased standing balance   PT Frequency 1x / week   PT Duration 12 weeks   PT Treatment/Interventions Therapeutic exercise;Therapeutic activities;Gait training;Balance training;Stair training;DME Instruction;Neuromuscular re-education;Patient/family education   PT Next Visit Plan Continue POC   PT Home Exercise Plan Continued from previous sessions.    Consulted and Agree with Plan of Care Patient      Patient will benefit from skilled therapeutic intervention in order to improve the following deficits and impairments:  Abnormal gait, Decreased balance, Decreased endurance, Difficulty walking, Decreased strength  Visit Diagnosis: Muscle weakness (generalized)  Difficulty in walking, not elsewhere classified  Lack of coordination     Problem List There are no active problems to display for this patient.  This entire session was performed under direct supervision and direction of a licensed therapist/therapist assistant . I have personally read, edited and approve of the note as written. Netta Corrigan, SPT Alanson Puls, PT, DPT 08/23/2017, 4:12 PM  [place] MAIN [facility] SERVICES [address]  [place], [place], [address] Phone: [phone]   Fax:  [phone]  Name: Michelle Randall MRN: [id number] Date of Birth: [birth year]

## 2017-08-30 ENCOUNTER — Encounter: Payer: Self-pay | Admitting: Occupational Therapy

## 2017-08-30 ENCOUNTER — Ambulatory Visit: Payer: Medicaid Other | Admitting: Physical Therapy

## 2017-08-30 ENCOUNTER — Ambulatory Visit: Payer: Medicaid Other | Admitting: Occupational Therapy

## 2017-08-30 ENCOUNTER — Encounter: Payer: Self-pay | Admitting: Physical Therapy

## 2017-08-30 DIAGNOSIS — M6281 Muscle weakness (generalized): Secondary | ICD-10-CM

## 2017-08-30 DIAGNOSIS — R279 Unspecified lack of coordination: Secondary | ICD-10-CM

## 2017-08-30 DIAGNOSIS — R262 Difficulty in walking, not elsewhere classified: Secondary | ICD-10-CM

## 2017-08-30 NOTE — Therapy (Signed)
[place] MAIN [facility] SERVICES [address] [place], [place], [address] Phone: [phone]   Fax:  [phone]  Physical Therapy Treatment  Patient Details  Name: Michelle Randall MRN: [id number] Date of Birth: [birth year] Referring Provider: Langley Gauss MD  Encounter Date: 08/30/2017      PT End of Session - 08/30/17 0939    Visit Number 104   Number of Visits 110   Date for PT Re-Evaluation 10/11/17   Authorization Type 6/12   Authorization Time Period 12 visits 9/13-12/5   PT Start Time 0933   PT Stop Time 1015   PT Time Calculation (min) 42 min   Equipment Utilized During Treatment Gait belt   Activity Tolerance Patient tolerated treatment well   Behavior During Therapy [facility] for tasks assessed/performed      History reviewed. No pertinent past medical history.  History reviewed. No pertinent surgical history.  There were no vitals filed for this visit.      Subjective Assessment - 08/30/17 0938    Subjective Pt states that she is having no pain today.  Continues to do HEP at home.   Patient is accompained by: Family member   Limitations Walking   Patient Stated Goals Patient wants to improve her core strength.    Currently in Pain? No/denies   Pain Score 0-No pain   Pain Onset In the past 7 days     Treatment:  Seated marches x 30 B LE's    Supine marches with TA contraction x 20 B LE's    Bridges with TA contraction x 30 reps   Hooklying hip abd with yellow theraband x 20 reps   Seated UE/LE alternating raises with abdominal brace x 20 each - cues for proper technqiue; visual cues using mirror to keep arms level and trunk in line during exercises   Seated core twists with red theraball x 20 each direction   Walking on carpet from gym to tile hallway and back x 1 - supervision for safety, no rest breaks needed   Seated scap retraction with red theraband x 30 reps   Seated diagonals with red theraband x 30  reps each way   Serratus punch with red theraband with TA contraction x 20 reps each                        PT Education - 08/30/17 0938    Education provided Yes   Education Details focus of sessions going forward to achieve goals of walking on inclines and transfers from varying surface heights   Person(s) Educated Patient   Methods Explanation;Demonstration   Comprehension Verbalized understanding;Returned demonstration             PT Long Term Goals - 08/23/17 1112      PT LONG TERM GOAL #1   Title Patient will improve Dynamic Gait Index (DGI) score to > 21/24 for meaningful improvement and low falls risk regarding dynamic walking tasks (revised from > 19/24 )   Baseline 14/24; 11/02/16: 15/24 01/28/17: 18/24 6/5: 19/24; 08/23/17 14/24   Time 12   Period Weeks   Status On-going   Target Date 10/11/17     PT LONG TERM GOAL #2   Title Patient will increase Berg Balance score by >51/56 points to be considered a low risk for falls for improved safety. (revised from >6 points improvement)   Baseline 11/02/16: 34/56 01/28/17: 39/56 6/5: 45/56; 08/23/2017 = 45/56  Time 12   Period Weeks   Status On-going   Target Date 10/11/17     PT LONG TERM GOAL #3   Title Patient will be able to transfer in and out of a large car with  a high seat with CGA  to improve ability to go to school/doctor visits.    Baseline Patient requires min A for transfer into large SUV unless it has a step rail on the side   Time 12   Period Weeks   Status On-going   Target Date 10/11/17     PT LONG TERM GOAL #4   Title Patient will complete a TUG test in < 12 seconds for independent mobility and decreased fall risk    Baseline 11.45; 08/23/2017 = 13.66 sec   Time 12   Period Weeks   Status Partially Met   Target Date 10/11/17     PT LONG TERM GOAL #5   Title Patient will improve 6 minute walk distance by > 150 ft for improved return to functional community activities    Baseline  740 01/28/2017: 771f 6/5: 795  730 on 07/19/17; 790 ft on 08/23/18   Time 12   Status Partially Met   Target Date 10/11/17     PT LONG TERM GOAL #6   Title Patient will improve gait speed to > 1.2 m/s with least restrictive assistive device to return to normal walking speed    Baseline .76 m/h 01/28/2017: .87  07/19/17 0.664m; 0.77 m/s on 08/23/17   Time 12   Period Weeks   Status On-going  10/11/17 target date     PT LONG TERM GOAL #7   Title Patient (< 6080ears old) will complete five times sit to stand test in < 10 seconds indicating an increased LE strength and improved balance    Baseline 9.45 sec; 9.63 on 08/23/17   Time 12   Period Weeks   Status Achieved     PT LONG TERM GOAL #8   Title Patient will be able to ambulate on inclines and grass independenlty with LRAD   Baseline Can ambulate over grassy inclines with decreased speed and safety requiring need loftstrand curtches and SBA   Time 12   Period Weeks   Status On-going  10/11/17 target date     PT LONG TERM GOAL  #9   TITLE Patient will be abe to transfer from low chair or stool without UE support independently   Baseline Patient needs min A assist to transfer from a low stool   Time 12   Period Weeks   Status Partially Met  10/11/17 target date     PT LONG TERM GOAL  #10   TITLE Patient will be able to transfer from the floor to standing independently with use of LRAD   Baseline Patient able to stand from the floor independently if she has not been on the floor longer than about 5 min due to stiffness with prolonged positioning   Time 12   Period Weeks   Status On-going  10/11/17 target date     PT LONG TERM GOAL  #11   TITLE Patient will step up onto a 6" step 5x with AD independently to increase community mobility   Baseline Pt. has increased difficulty requiring supervision for safety, but no physical assist   Time 12   Period Weeks   Status Partially Met  10/11/17 target date  Plan  - 08/30/17 1011    Clinical Impression Statement Pt was able to perform supine and seated core stability and postural strengthening today with decreased fatigue and decreased need for rest breaks noted.  Pt was able to ambulate for extended distances today with limited fatigue, however with increased distance pt demonstrated decrease in postural alignment and improper pattern noted.  Pt was educated on new HEP exercises to add in order to continue core and hip strengthening at home.  Pt would continue to benefit from skilled PT services in order to address LE and core weakness as well as decreased endurance and activity tolerance in order to decrease risk of falls and imporve funcitonal mobility.   Rehab Potential Good   Clinical Impairments Affecting Rehab Potential weakness and decreased standing balance   PT Frequency 1x / week   PT Duration 12 weeks   PT Treatment/Interventions Therapeutic exercise;Therapeutic activities;Gait training;Balance training;Stair training;DME Instruction;Neuromuscular re-education;Patient/family education   PT Next Visit Plan Continue POC   PT Home Exercise Plan added seated dynamic core UE and LE lift offs with visual cues using mirror, supine marches   Consulted and Agree with Plan of Care Patient      Patient will benefit from skilled therapeutic intervention in order to improve the following deficits and impairments:  Abnormal gait, Decreased balance, Decreased endurance, Difficulty walking, Decreased strength  Visit Diagnosis: Muscle weakness (generalized)  Lack of coordination  Difficulty in walking, not elsewhere classified     Problem List There are no active problems to display for this patient. This entire session was performed under direct supervision and direction of a licensed therapist/therapist assistant . I have personally read, edited and approve of the note as written. Netta Corrigan, SPT Alanson Puls, PT, DPT  08/30/2017,  10:16 AM  [place] MAIN [facility] SERVICES [place], [place], [address] Phone: [phone]   Fax:  [phone]  Name: Michelle Randall MRN: [id number] Date of Birth: Apr 13, 1996

## 2017-09-01 NOTE — Therapy (Signed)
[place] MAIN [facility] SERVICES [address] [place], [place], [address] Phone: [phone]   Fax:  [phone]  Occupational Therapy Treatment  Patient Details  Name: Michelle Randall MRN: [id number] Date of Birth: Apr 20, 1996 No Data Recorded  Encounter Date: 08/30/2017      OT End of Session - 09/01/17 1341    Visit Number 96   Number of Visits 106   Date for OT Re-Evaluation 09/09/17   Authorization Type medicaid visit 55 of 106   OT Start Time 1015   OT Stop Time 1100   OT Time Calculation (min) 45 min   Activity Tolerance Patient tolerated treatment well   Behavior During Therapy [facility] for tasks assessed/performed      History reviewed. No pertinent past medical history.  History reviewed. No pertinent surgical history.  There were no vitals filed for this visit.                    OT Treatments/Exercises (OP) - 09/01/17 1343      Fine Motor Coordination   Other Fine Motor Exercises Fine motor coordination tasks with focus on manipulation of small pegs with right hand and then left hand with use of translatory skills of the hand to move objects to palm, using the hand for storage and then moving items back to fingertips to place into grid.  Cues provided for movement of items from tip to palm and back.       Neurological Re-education Exercises   Other Exercises 1 Red theraband exercises for bilateral UE strengthening for shoulder flexion, ABD, diagonal patterns and elbow flexion/extension for 15 repetitions for 2 sets with therapist demo and verbal cues for technique.  Patient seen for finger strengthening with use of push pins to place onto bulletin board with moderate resistance, board lying flat and at an angle.  Cues for pushing pins all the way into the board and for technique.  Both hands utilized, one set with right to place and remove and then alternated to left.  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                 OT Education - 09/01/17 1341    Education provided Yes   Education Details fine motor coordination tasks, finger strengthening   Person(s) Educated Patient   Methods Explanation;Demonstration;Verbal cues   Comprehension Verbal cues required;Returned demonstration;Verbalized understanding             OT Long Term Goals - 08/13/17 1119      OT LONG TERM GOAL #1   Title Patient will demonstrate increased strength in right hand sufficient to scoop ice cream from frozen container with modified independence.   Baseline goal update:  patient now able to complete with minimal assist at times.   Time 12   Period Months   Status On-going     OT LONG TERM GOAL #2   Title Patient will be able to make her bed with modified independence.   Baseline she continues to have difficulty with balance and completing this task, increased time, minimal assist at goal update   Time 6   Period Months   Status On-going     OT LONG TERM GOAL #3   Title Patient will demonstrate the ability to perform vacuuming of one room with modified independence using light weight vacuum.   Baseline moderate assist at goal update.  Time 6   Period Months   Status On-going     OT LONG TERM GOAL #4   Title Patient will demonstrate the ability to open containers with sealed tops such as on yogurt containers and individual ice cream containers.    Baseline goal update:  occasional assistance required but able to complete more than 50% of the time with modified independence.    Time 12   Period Weeks   Status Achieved     OT LONG TERM GOAL #5   Title Patient will demonstrate the ability to perform small buttons on shirts independently and with good speed in order to dress for school.   Baseline goal update: Patient can perform with increased time but occasionally needs assist if rushed.   Time 12   Period Weeks   Status Partially Met     OT LONG TERM  GOAL #6   Title Patient will donn and doff small earrings with modified independence.   Baseline Goal update:  able to hold small backs and dropping only 25% of the time, slow to complete but improving.  Min assist    Time 12   Period Weeks   Status Partially Met     OT LONG TERM GOAL #7   Title Patient will be able to zip her rainboots while they are on her feet with modified independence.     Baseline moderate assist to don   Time 12   Period Weeks   Status On-going     OT LONG TERM GOAL #8   Title Patient to complete am self care demonstrating efficient methods of dressing skills to get to school on time.    Baseline Goal update:  patient continues to work towards efficiency and was late 1 time in the last 2 weeks.   Time 12   Period Weeks   Status Partially Met     OT LONG TERM GOAL  #9   Baseline Patient will don/doff socks and AFO braces with modified independence using adaptive equipment as needed.  (Baseline min assist)   Time 12   Period Weeks   Status New     OT LONG TERM GOAL  #10   TITLE Patient will demonstrate the ability to pull jeans down over AFOs with modified independence.    Baseline moderate assist.   Time 12   Period Weeks   Status New     OT LONG TERM GOAL  #11   TITLE Will be able to squeeze tooth paste out of the tube when it is less than half full.   Baseline difficulty with small travel sized, may look into alternative ways since this has been a chronic issue.   Time 6   Period Months   Status Achieved     OT LONG TERM GOAL  #12   TITLE Patient will demonstrate managing wheelchair rain poncho in inclement weather with modified independence.   Baseline crutch tips are difficult for anyone to take them off, goal deferred.   Time 6   Period Months   Status Achieved     OT LONG TERM GOAL  #13   TITLE Patient will be able to complete 2 1/2 minutes straight with left shoulder flexion simulating driving with a stearing wheel ball to be able to  drive to college.    Baseline Patient has not been driving, her family takes her to school and drops her off.    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    Baseline difficulty coordinating the use of both hands to complete task.    Status Partially Met     OT LONG TERM GOAL  #15   TITLE Patient will be able to obtain food tray in the cafeteria from a wheelchair level and take to the table without spilling with modified independence.    Baseline max assist   Time 6   Period Months   Status Achieved               Plan - 09/01/17 1342    Clinical Impression Statement Patient continues to demonstrate some muscle weakness in her fingers with use of push pins into bulletin board.  She continues to work on exercises at home with resistive putty.  Able to demonstrate theraband exercises for shoulder ROM and strength this date with cues for proper form and technique.  Will plan to reassess goals and update status in the next 1-2 sessions.  Patient to identify difficult activities at home and school.    Rehab Potential Good   OT Frequency 1x / week   OT Duration 12 weeks   OT Treatment/Interventions Self-care/ADL training;Therapeutic exercise;Therapeutic exercises;Therapeutic activities;DME and/or AE instruction;Patient/family education   Consulted and Agree with Plan of Care Patient      Patient will benefit from skilled therapeutic intervention in order to improve the following deficits and impairments:  Impaired UE functional use, Difficulty walking, Decreased strength, Decreased coordination, Decreased endurance, Decreased balance  Visit Diagnosis: Muscle weakness (generalized)  Lack of coordination    Problem List There are no active problems to display for this patient.  Achilles Dunk, OTR/L,  CLT  Lovett,Amy 09/01/2017, 1:46 PM  [place] MAIN [facility] SERVICES [address] [place], [place], [address] Phone: [phone]   Fax:  [phone]  Name: Michelle Randall MRN: [id number] Date of Birth: [birth year]

## 2017-09-06 ENCOUNTER — Encounter: Payer: Self-pay | Admitting: Occupational Therapy

## 2017-09-06 ENCOUNTER — Encounter: Payer: Self-pay | Admitting: Physical Therapy

## 2017-09-06 ENCOUNTER — Ambulatory Visit: Payer: Medicaid Other

## 2017-09-06 ENCOUNTER — Ambulatory Visit: Payer: Medicaid Other | Admitting: Occupational Therapy

## 2017-09-06 DIAGNOSIS — R279 Unspecified lack of coordination: Secondary | ICD-10-CM

## 2017-09-06 DIAGNOSIS — M6281 Muscle weakness (generalized): Secondary | ICD-10-CM

## 2017-09-06 DIAGNOSIS — R262 Difficulty in walking, not elsewhere classified: Secondary | ICD-10-CM

## 2017-09-06 NOTE — Therapy (Signed)
[place] MAIN [facility] SERVICES [address] [place], [place], [address] Phone: [phone]   Fax:  [phone]  Physical Therapy Treatment  Patient Details  Name: Michelle Randall MRN: [id number] Date of Birth: 1996-03-27 Referring Provider: Langley Gauss MD  Encounter Date: 09/06/2017      PT End of Session - 09/06/17 0935    Visit Number 105   Number of Visits 110   Date for PT Re-Evaluation 10/11/17   Authorization Type 7/12   Authorization Time Period 12 visits 9/13-12/5   PT Start Time 0935   PT Stop Time 1015   PT Time Calculation (min) 40 min   Equipment Utilized During Treatment Gait belt   Activity Tolerance Patient tolerated treatment well   Behavior During Therapy [facility] for tasks assessed/performed      History reviewed. No pertinent past medical history.  History reviewed. No pertinent surgical history.  There were no vitals filed for this visit.      Subjective Assessment - 09/06/17 0934    Subjective Pt reports she is doing well today. Denies pain. No specific questions or concerns. consistent with HEP   Patient is accompained by: Family member   Limitations Walking   Patient Stated Goals Patient wants to improve her core strength.    Currently in Pain? No/denies         TREATMENT  Ther-ex  Supine marches with TA contraction 2 x 15 B LEs;  Bridges with TA contraction 2 x 15 reps; Hooklying unilateral LE bicycles 2 x 15 bilateral; Supine hip abduction with manual resistance at ankles 2 x 15 bilateral; Supine isometric resisted rotation with arms extended and resistance applied in alternating directions by therapist x multiple bouts; Hooklying pball UE press for isometric abdominal contraction 5s hold 2 x 15; Pball sitting with narrow feet and alternating LE marches/walk-outs x multiple bouts on each side; Pball sitting with narrow feet and trunk rotations passing 1kg ball around body to therapist x  multiple bouts to each side; Static sitting red tband D2 flexion 2 x 15 bilateral;  Intermittent cues required for form/technique throughout exercises. Assist required for knee flexion during hooklying single LE bicycles, minA+1 for all sitting pball activities;                           PT Education - 09/06/17 0935    Education provided Yes   Education Details exercise form/technique   Person(s) Educated Patient   Methods Explanation   Comprehension Verbalized understanding             PT Long Term Goals - 08/23/17 1112      PT LONG TERM GOAL #1   Title Patient will improve Dynamic Gait Index (DGI) score to > 21/24 for meaningful improvement and low falls risk regarding dynamic walking tasks (revised from > 19/24 )   Baseline 14/24; 11/02/16: 15/24 01/28/17: 18/24 6/5: 19/24; 08/23/17 14/24   Time 12   Period Weeks   Status On-going   Target Date 10/11/17     PT LONG TERM GOAL #2   Title Patient will increase Berg Balance score by >51/56 points to be considered a low risk for falls for improved safety. (revised from >6 points improvement)   Baseline 11/02/16: 34/56 01/28/17: 39/56 6/5: 45/56; 08/23/2017 = 45/56   Time 12   Period Weeks   Status On-going   Target Date 10/11/17     PT LONG  TERM GOAL #3   Title Patient will be able to transfer in and out of a large car with  a high seat with CGA  to improve ability to go to school/doctor visits.    Baseline Patient requires min A for transfer into large SUV unless it has a step rail on the side   Time 12   Period Weeks   Status On-going   Target Date 10/11/17     PT LONG TERM GOAL #4   Title Patient will complete a TUG test in < 12 seconds for independent mobility and decreased fall risk    Baseline 11.45; 08/23/2017 = 13.66 sec   Time 12   Period Weeks   Status Partially Met   Target Date 10/11/17     PT LONG TERM GOAL #5   Title Patient will improve 6 minute walk distance by > 150 ft for improved  return to functional community activities    Baseline 740 01/28/2017: 71f 6/5: 795  730 on 07/19/17; 790 ft on 08/23/18   Time 12   Status Partially Met   Target Date 10/11/17     PT LONG TERM GOAL #6   Title Patient will improve gait speed to > 1.2 m/s with least restrictive assistive device to return to normal walking speed    Baseline .76 m/h 01/28/2017: .87  07/19/17 0.618m; 0.77 m/s on 08/23/17   Time 12   Period Weeks   Status On-going  10/11/17 target date     PT LONG TERM GOAL #7   Title Patient (< 6020ears old) will complete five times sit to stand test in < 10 seconds indicating an increased LE strength and improved balance    Baseline 9.45 sec; 9.63 on 08/23/17   Time 12   Period Weeks   Status Achieved     PT LONG TERM GOAL #8   Title Patient will be able to ambulate on inclines and grass independenlty with LRAD   Baseline Can ambulate over grassy inclines with decreased speed and safety requiring need loftstrand curtches and SBA   Time 12   Period Weeks   Status On-going  10/11/17 target date     PT LONG TERM GOAL  #9   TITLE Patient will be abe to transfer from low chair or stool without UE support independently   Baseline Patient needs min A assist to transfer from a low stool   Time 12   Period Weeks   Status Partially Met  10/11/17 target date     PT LONG TERM GOAL  #10   TITLE Patient will be able to transfer from the floor to standing independently with use of LRAD   Baseline Patient able to stand from the floor independently if she has not been on the floor longer than about 5 min due to stiffness with prolonged positioning   Time 12   Period Weeks   Status On-going  10/11/17 target date     PT LONG TERM GOAL  #11   TITLE Patient will step up onto a 6" step 5x with AD independently to increase community mobility   Baseline Pt. has increased difficulty requiring supervision for safety, but no physical assist   Time 12   Period Weeks   Status Partially  Met  10/11/17 target date               Plan - 09/06/17 1111    Clinical Impression Statement Pt with good motivation throughout therpay session.  She struggles with medial/lateral core stability on physioball with dynamic movements and small BOS. Pt encouraged to continue HEP and follow-up as scheduled. Will continue to progress LE strengthening and core stabilty. Pt will benefit from skilled PT services to address deficits in decreased strength, endurance, and activity tolerance in order to improve mobility and function at school and with leisure activities.    Rehab Potential Good   Clinical Impairments Affecting Rehab Potential weakness and decreased standing balance   PT Frequency 1x / week   PT Duration 12 weeks   PT Treatment/Interventions Therapeutic exercise;Therapeutic activities;Gait training;Balance training;Stair training;DME Instruction;Neuromuscular re-education;Patient/family education   PT Next Visit Plan Continue POC   PT Home Exercise Plan added seated dynamic core UE and LE lift offs with visual cues using mirror, supine marches   Consulted and Agree with Plan of Care Patient      Patient will benefit from skilled therapeutic intervention in order to improve the following deficits and impairments:  Abnormal gait, Decreased balance, Decreased endurance, Difficulty walking, Decreased strength  Visit Diagnosis: Muscle weakness (generalized)  Difficulty in walking, not elsewhere classified     Problem List There are no active problems to display for this patient.  Phillips Grout PT, DPT   Aime Carreras 09/06/2017, 11:17 AM  [place] MAIN [facility] SERVICES [address] [place], [place], [address] Phone: [phone]   Fax:  [phone]  Name: Michelle Randall MRN: [id number] Date of Birth: Nov 06, 1996

## 2017-09-08 NOTE — Therapy (Signed)
[place] MAIN [facility] SERVICES [address] [place], [place], [address] Phone: [phone]   Fax:  [phone]  Occupational Therapy Treatment/Recertification  Patient Details  Name: Michelle Randall MRN: [id number] Date of Birth: 21-Dec-1995 No Data Recorded  Encounter Date: 09/06/2017      OT End of Session - 09/08/17 1821    Visit Number 97   Number of Visits 118   Date for OT Re-Evaluation 11/25/17   Authorization Type medicaid visit 68 of 118   OT Start Time 1015   OT Stop Time 1100   OT Time Calculation (min) 45 min   Activity Tolerance Patient tolerated treatment well   Behavior During Therapy [facility] for tasks assessed/performed      History reviewed. No pertinent past medical history.  History reviewed. No pertinent surgical history.  There were no vitals filed for this visit.      Subjective Assessment - 09/08/17 1834    Subjective  Patient reports she is able to open yogurt containers better with improved pinch. She is attempting to manage small earrings and not dropping the backs as much as in the past.   Patient Stated Goals To be as independent as possible at home and at school.    Currently in Pain? No/denies   Pain Score 0-No pain                      OT Treatments/Exercises (OP) - 09/08/17 1819      ADLs   ADL Comments Reassessment of self-care tasks as follows: Patient able to open yogurt containers 90% of the time now. She still has difficulty with putting in small earrings but she is not dropping the backs as much or fumbling with them. She still has difficulty and needs to work on managing buttons in a more timely manner, managing tighter clothing over her AFO's, getting dressed after a shower in a timely manner, using an ice cream scoop, and rolling coins in wrappers for banking. She would like to continue to work on bed making however she does not feel she has time to perform this task in the  mornings due to the significant time it takes her to perform her basic self-care. She reports in the last two weeks she had difficulty getting ready on time 3 to 4 days and required assistance from Mom in order to be on time for school. She has continued to make progress with basic self-care tasks, school related activities in community activities. Her goal is to be able to live alone in the next year therefore we will plan to work towards complete independence in self-care, and greater independence with household management tasks.      Neurological Re-education Exercises   Other Exercises 1 Patient was seen for reassessment of skills this date as follows: grip strength measured with dynamometer right hand 48 pounds, left hand 45 pounds. Right lateral pinch 12 pounds left 11 pounds, three point pinch right 11 pounds left 11 pounds, twopoint Pinch right hand 8 pounds, left hand 7 pounds. Nine hole peg test for coordination right-hand 21 seconds left in 23 seconds.                 OT Education - 09/08/17 1821    Education provided Yes   Education Details goals, plan of care, HEP   Person(s) Educated Patient   Methods Explanation;Demonstration;Verbal cues   Comprehension Verbal cues required;Returned demonstration;Verbalized understanding  OT Long Term Goals - 09/08/17 1823      OT LONG TERM GOAL #1   Title Patient will demonstrate increased strength in right hand sufficient to scoop ice cream from frozen container with modified independence.   Baseline goal update:  patient now able to complete with minimal assist at times.   Time 12   Period Months   Status On-going     OT LONG TERM GOAL #2   Title Patient will be able to make her bed with modified independence.   Baseline she continues to have difficulty with balance and completing this task, increased time, minimal assist at goal update, decreased time in the mornings to perform due to self care demands   Time 6    Period Months   Status On-going     OT LONG TERM GOAL #3   Title Patient will demonstrate the ability to perform vacuuming of one room with modified independence using light weight vacuum.   Baseline moderate assist at goal update.   Time 6   Period Months   Status Deferred     OT LONG TERM GOAL #4   Title Patient will demonstrate the ability to open containers with sealed tops such as on yogurt containers and individual ice cream containers.    Baseline goal update:  occasional assistance required but able to complete more than 50% of the time with modified independence.    Time 12   Period Weeks   Status Achieved     OT LONG TERM GOAL #5   Title Patient will demonstrate the ability to perform small buttons on shirts independently and with good speed in order to dress for school.   Baseline goal update: Patient can perform with increased time but occasionally needs assist if rushed.   Time 12   Period Weeks   Status Partially Met     OT LONG TERM GOAL #6   Title Patient will donn and doff small earrings with modified independence.   Baseline Goal update:  able to hold small backs and dropping only 20% of the time, slow to complete but improving.  Min assist    Time 12   Period Weeks   Status Partially Met     OT LONG TERM GOAL #7   Title Patient will be able to zip her rainboots while they are on her feet with modified independence.     Baseline patient unable to fit into current boots secondary to new AFOs   Time 12   Period Weeks   Status On-going     OT LONG TERM GOAL #8   Title Patient to complete am self care demonstrating efficient methods of dressing skills to get to school on time.    Baseline Goal update:  patient continues to work towards efficiency and was late 3 time in the last 2 weeks.   Time 12   Period Weeks   Status Partially Met     OT LONG TERM GOAL  #9   Baseline Patient will don/doff socks and AFO braces with modified independence using adaptive  equipment as needed.  (Baseline min assist)   Time 12   Period Weeks   Status New     OT LONG TERM GOAL  #10   TITLE Patient will demonstrate the ability to pull jeans down over AFOs with modified independence.    Baseline  min assist   Time 12   Period Weeks   Status On-going     OT  LONG TERM GOAL  #11   TITLE Will be able to squeeze tooth paste out of the tube when it is less than half full.   Baseline difficulty with small travel sized, may look into alternative ways since this has been a chronic issue.   Time 6   Period Months   Status Achieved     OT LONG TERM GOAL  #12   TITLE Patient will demonstrate managing wheelchair rain poncho in inclement weather with modified independence.   Baseline crutch tips are difficult for anyone to take them off, goal deferred.   Time 6   Period Months   Status Achieved     OT LONG TERM GOAL  #13   TITLE Patient will be able to complete 2 1/2 minutes straight with left shoulder flexion simulating driving with a stearing wheel ball to be able to drive to college.    Baseline Patient has not been driving, her family takes her to school and drops her off.    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    Baseline difficulty coordinating the use of both hands to complete task.    Status Partially Met     OT LONG TERM GOAL  #15   TITLE Patient will complete meal preparation with minimal assist.    Baseline max assist   Time 12   Period Weeks   Status New               Plan - 09/08/17 1823    Clinical Impression Statement Patient has continued to progress in all areas, some goals met and others still in progress (see goal section).  Patient is working towards refining her techniques with basic self care and IADL tasks and focusing on speed of completion so  she can be on time for her college classes.  Her goal is to be able to live alone in the next year therefore we will plan to work towards complete independence in self-care, and greater independence with household management tasks.  We will plan to continue to modify her approach to tasks, utilize adaptive equipment when needed and work towards improved strength to complete necessary daily tasks.  She would like to continue to work on bed making, speed of am bathing/dressing tasks, buttons, managing small earring backs to don jewelry, the ability to manage tighter clothing over her AFOs, rolling coins for banking.  She has indicated she will need to work towards higher level IADL tasks to include meal preparation, laundry and moving around the kitchen in order to manage living alone.  Patient continues to benefit from skilled OT to maximize her safety and independence in daily tasks.    Rehab Potential Good   OT Frequency 1x / week   OT Duration 12 weeks   OT Treatment/Interventions Self-care/ADL training;Therapeutic exercise;Therapeutic exercises;Therapeutic activities;DME and/or AE instruction;Patient/family education   Consulted and Agree with Plan of Care Patient      Patient will benefit from skilled therapeutic intervention in order to improve the following deficits and impairments:  Impaired UE functional use, Difficulty walking, Decreased strength, Decreased coordination, Decreased endurance, Decreased balance  Visit Diagnosis: Muscle weakness (generalized)  Lack of coordination    Problem List There are no active problems to display  for this patient.  Achilles Dunk, OTR/L, CLT  Michelle Randall 09/08/2017, 6:34 PM  [place] MAIN [facility] SERVICES [address] [place], [place], [address] Phone: [phone]   Fax:  [phone]  Name: Michelle Randall MRN: [id number] Date of Birth: 1996/03/08

## 2017-09-13 ENCOUNTER — Ambulatory Visit: Payer: Medicaid Other | Admitting: Occupational Therapy

## 2017-09-13 ENCOUNTER — Ambulatory Visit: Payer: Medicaid Other | Admitting: Physical Therapy

## 2017-09-20 ENCOUNTER — Encounter: Payer: Self-pay | Admitting: Physical Therapy

## 2017-09-20 ENCOUNTER — Ambulatory Visit: Payer: Medicaid Other | Admitting: Physical Therapy

## 2017-09-20 ENCOUNTER — Ambulatory Visit: Payer: Medicaid Other | Attending: Pediatrics | Admitting: Occupational Therapy

## 2017-09-20 ENCOUNTER — Encounter: Payer: Self-pay | Admitting: Occupational Therapy

## 2017-09-20 DIAGNOSIS — R279 Unspecified lack of coordination: Secondary | ICD-10-CM | POA: Insufficient documentation

## 2017-09-20 DIAGNOSIS — R278 Other lack of coordination: Secondary | ICD-10-CM | POA: Diagnosis present

## 2017-09-20 DIAGNOSIS — Z9181 History of falling: Secondary | ICD-10-CM | POA: Insufficient documentation

## 2017-09-20 DIAGNOSIS — R262 Difficulty in walking, not elsewhere classified: Secondary | ICD-10-CM

## 2017-09-20 DIAGNOSIS — M6281 Muscle weakness (generalized): Secondary | ICD-10-CM

## 2017-09-20 DIAGNOSIS — R460 Very low level of personal hygiene: Secondary | ICD-10-CM | POA: Insufficient documentation

## 2017-09-20 DIAGNOSIS — R531 Weakness: Secondary | ICD-10-CM | POA: Insufficient documentation

## 2017-09-20 NOTE — Therapy (Signed)
[place] MAIN [facility] SERVICES [address] [place], [place], [address] Phone: [phone]   Fax:  [phone]  Physical Therapy Treatment  Patient Details  Name: Michelle Randall MRN: [id number] Date of Birth: 1996/05/08 Referring Provider: Langley Gauss MD   Encounter Date: 09/20/2017  PT End of Session - 09/20/17 1111    Visit Number  106    Number of Visits  110    Date for PT Re-Evaluation  10/11/17    Authorization Type  8/12    Authorization Time Period  12 visits 9/13-12/5    PT Start Time  1020    PT Stop Time  1100    PT Time Calculation (min)  40 min    Equipment Utilized During Treatment  Gait belt    Activity Tolerance  Patient tolerated treatment well    Behavior During Therapy  [facility] for tasks assessed/performed       History reviewed. No pertinent past medical history.  History reviewed. No pertinent surgical history.  There were no vitals filed for this visit.  Subjective Assessment - 09/20/17 1110    Subjective  Pt reports she is doing well today. Denies pain. No specific questions or concerns. consistent with HEP. She is walking on the TM on the weekends only.    Patient is accompained by:  Family member    Limitations  Walking    Patient Stated Goals  Patient wants to improve her core strength.     Currently in Pain?  No/denies    Pain Score  0-No pain    Pain Onset  In the past 7 days    Multiple Pain Sites  No       Treatment:  Nu-step x 5 mins  SLR standing  x 15 B LE's  Step ups to 6 inch stool x 20   Leg press 60 lbs x 20 x 2  LE marches standing x 20 B LE's  Core twists with red theraband x 20 each way  Patient needs VC for posture and correct technique with exercises.                          PT Education - 09/20/17 1110    Education provided  Yes    Education Details  HEP on TM    Person(s) Educated  Patient    Methods  Explanation    Comprehension   Verbalized understanding;Returned demonstration          PT Long Term Goals - 08/23/17 1112      PT LONG TERM GOAL #1   Title  Patient will improve Dynamic Gait Index (DGI) score to > 21/24 for meaningful improvement and low falls risk regarding dynamic walking tasks (revised from > 19/24 )    Baseline  14/24; 11/02/16: 15/24 01/28/17: 18/24 6/5: 19/24; 08/23/17 14/24    Time  12    Period  Weeks    Status  On-going    Target Date  10/11/17      PT LONG TERM GOAL #2   Title  Patient will increase Berg Balance score by >51/56 points to be considered a low risk for falls for improved safety. (revised from >6 points improvement)    Baseline  11/02/16: 34/56 01/28/17: 39/56 6/5: 45/56; 08/23/2017 = 45/56    Time  12    Period  Weeks    Status  On-going    Target Date  10/11/17      PT LONG TERM GOAL #3   Title  Patient will be able to transfer in and out of a large car with  a high seat with CGA  to improve ability to go to school/doctor visits.     Baseline  Patient requires min A for transfer into large SUV unless it has a step rail on the side    Time  12    Period  Weeks    Status  On-going    Target Date  10/11/17      PT LONG TERM GOAL #4   Title  Patient will complete a TUG test in < 12 seconds for independent mobility and decreased fall risk     Baseline  11.45; 08/23/2017 = 13.66 sec    Time  12    Period  Weeks    Status  Partially Met    Target Date  10/11/17      PT LONG TERM GOAL #5   Title  Patient will improve 6 minute walk distance by > 150 ft for improved return to functional community activities     Baseline  740 01/28/2017: 750f 6/5: 795  730 on 07/19/17; 790 ft on 08/23/18    Time  12    Status  Partially Met    Target Date  10/11/17      PT LONG TERM GOAL #6   Title  Patient will improve gait speed to > 1.2 m/s with least restrictive assistive device to return to normal walking speed     Baseline  .76 m/h 01/28/2017: .87  07/19/17 0.648m; 0.77 m/s on 08/23/17     Time  12    Period  Weeks    Status  On-going 10/11/17 target date   10/11/17 target date     PT LONG TERM GOAL #7   Title  Patient (< 6039ears old) will complete five times sit to stand test in < 10 seconds indicating an increased LE strength and improved balance     Baseline  9.45 sec; 9.63 on 08/23/17    Time  12    Period  Weeks    Status  Achieved      PT LONG TERM GOAL #8   Title  Patient will be able to ambulate on inclines and grass independenlty with LRAD    Baseline  Can ambulate over grassy inclines with decreased speed and safety requiring need loftstrand curtches and SBA    Time  12    Period  Weeks    Status  On-going 10/11/17 target date   10/11/17 target date     PT LONG TERM GOAL  #9   TITLE  Patient will be abe to transfer from low chair or stool without UE support independently    Baseline  Patient needs min A assist to transfer from a low stool    Time  12    Period  Weeks    Status  Partially Met 10/11/17 target date   10/11/17 target date     PT LONG TERM GOAL  #10   TITLE  Patient will be able to transfer from the floor to standing independently with use of LRAD    Baseline  Patient able to stand from the floor independently if she has not been on the floor longer than about 5 min due to stiffness with prolonged positioning    Time  12    Period  Weeks  Status  On-going 10/11/17 target date   10/11/17 target date     PT LONG TERM GOAL  #11   TITLE  Patient will step up onto a 6" step 5x with AD independently to increase community mobility    Baseline  Pt. has increased difficulty requiring supervision for safety, but no physical assist    Time  12    Period  Weeks    Status  Partially Met 10/11/17 target date   10/11/17 target date           Plan - 09/20/17 1112    Clinical Impression Statement  Pt instructed in therapeutic exercise including core strengthening exercise, and functional strengthening for sit<>stand and for step-ups on  stairs/curbs. Pt remains limited with step ups with minor instability and max difficulty with a 5" step in the parallel bars. Pt may benefit from alternative step up method to increase community access. Pt will continue to benefit from skilled therapy in order to maximize functional mobility and safety.    Rehab Potential  Good    Clinical Impairments Affecting Rehab Potential  weakness and decreased standing balance    PT Frequency  1x / week    PT Duration  12 weeks    PT Treatment/Interventions  Therapeutic exercise;Therapeutic activities;Gait training;Balance training;Stair training;DME Instruction;Neuromuscular re-education;Patient/family education    PT Next Visit Plan  Continue POC    PT Home Exercise Plan  added seated dynamic core UE and LE lift offs with visual cues using mirror, supine marches    Consulted and Agree with Plan of Care  Patient       Patient will benefit from skilled therapeutic intervention in order to improve the following deficits and impairments:  Abnormal gait, Decreased balance, Decreased endurance, Difficulty walking, Decreased strength  Visit Diagnosis: Muscle weakness (generalized)  Lack of coordination  Difficulty in walking, not elsewhere classified  Other lack of coordination     Problem List There are no active problems to display for this patient.   [address] [place] DPT 09/20/2017, 11:17 AM  [place] MAIN [facility] SERVICES [place], [place], [address] Phone: [phone]   Fax:  [phone]  Name: Michelle Randall MRN: [id number] Date of Birth: [birth year]

## 2017-09-21 NOTE — Therapy (Signed)
[place] MAIN [facility] SERVICES [address] [place], [place], [address] Phone: [phone]   Fax:  [phone]  Occupational Therapy Treatment  Patient Details  Name: Michelle Randall MRN: [id number] Date of Birth: 1996/04/11 No Data Recorded  Encounter Date: 09/20/2017  OT End of Session - 09/24/17 1741    Visit Number  98    Number of Visits  118    Date for OT Re-Evaluation  11/25/17    Authorization Type  medicaid visit 39 of 118    OT Start Time  0930    OT Stop Time  1015    OT Time Calculation (min)  45 min    Activity Tolerance  Patient tolerated treatment well    Behavior During Therapy  [facility] for tasks assessed/performed       History reviewed. No pertinent past medical history.  History reviewed. No pertinent surgical history.  There were no vitals filed for this visit.  Subjective Assessment - 09/24/17 1737    Subjective   Patient reports school has been going well, she is liking her schedule this semester.  Had fun at homecoming and went to a concert.     Patient Stated Goals  To be as independent as possible at home and at school.     Currently in Pain?  No/denies    Pain Score  0-No pain    Multiple Pain Sites  No                   OT Treatments/Exercises (OP) - 09/24/17 0001      ADLs   ADL Comments  Patient seen for meal planning with making list of potential snacks and dishes she would like to be able to make on her own.  these items include mac and cheese, brownies, cookies, rice, spaghetti, mashed potatoes, salad and chopping appropriate veggies to place into salad.  Patient seen for handwriting skills to formulate list and to make additional comments on more detail of items with cues.              OT Education - 09/24/17 1740    Education provided  Yes    Education Details  meal planning    Person(s) Educated  Patient    Methods  Explanation;Demonstration;Verbal cues    Comprehension   Verbal cues required;Returned demonstration;Verbalized understanding          OT Long Term Goals - 09/20/17 0929      OT LONG TERM GOAL #1   Title  Patient will demonstrate increased strength in right hand sufficient to scoop ice cream from frozen container with modified independence.    Baseline  goal update:  patient now able to complete with minimal assist at times.    Time  12    Period  Months    Status  On-going      OT LONG TERM GOAL #2   Title  Patient will be able to make her bed with modified independence.    Baseline  she continues to have difficulty with balance and completing this task, increased time, minimal assist at goal update, decreased time in the mornings to perform due to self care demands    Time  6    Period  Months    Status  On-going      OT LONG TERM GOAL #3   Title  Patient will demonstrate the ability to perform vacuuming of one room with modified independence using light  weight vacuum.    Baseline  moderate assist at goal update.    Time  6    Period  Months    Status  Deferred      OT LONG TERM GOAL #4   Title  Patient will demonstrate the ability to open containers with sealed tops such as on yogurt containers and individual ice cream containers.     Baseline  goal update:  occasional assistance required but able to complete more than 50% of the time with modified independence.     Time  12    Period  Weeks    Status  Achieved      OT LONG TERM GOAL #5   Title  Patient will demonstrate the ability to perform small buttons on shirts independently and with good speed in order to dress for school.    Baseline  goal update: Patient can perform with increased time but occasionally needs assist if rushed.    Time  12    Period  Weeks    Status  Partially Met      OT LONG TERM GOAL #6   Title  Patient will donn and doff small earrings with modified independence.    Baseline  Goal update:  able to hold small backs and dropping only 20% of the  time, slow to complete but improving.  Min assist     Time  12    Period  Weeks    Status  Partially Met      OT LONG TERM GOAL #7   Title  Patient will be able to zip her rainboots while they are on her feet with modified independence.      Baseline  patient unable to fit into current boots secondary to new AFOs    Time  12    Period  Weeks    Status  On-going      OT LONG TERM GOAL #8   Title  Patient to complete am self care demonstrating efficient methods of dressing skills to get to school on time.     Baseline  Goal update:  patient continues to work towards efficiency and was late 3 time in the last 2 weeks.    Time  12    Period  Weeks    Status  Partially Met      OT LONG TERM GOAL  #9   Baseline  Patient will don/doff socks and AFO braces with modified independence using adaptive equipment as needed.  (Baseline min assist)    Time  12    Period  Weeks    Status  New      OT LONG TERM GOAL  #10   TITLE  Patient will demonstrate the ability to pull jeans down over AFOs with modified independence.     Baseline   min assist    Time  12    Period  Weeks    Status  On-going      OT LONG TERM GOAL  #11   TITLE  Will be able to squeeze tooth paste out of the tube when it is less than half full.    Baseline  difficulty with small travel sized, may look into alternative ways since this has been a chronic issue.    Time  6    Period  Months    Status  Achieved      OT LONG TERM GOAL  #12   TITLE  Patient will demonstrate managing wheelchair  rain poncho in inclement weather with modified independence.    Baseline  crutch tips are difficult for anyone to take them off, goal deferred.    Time  6    Period  Months    Status  Achieved      OT LONG TERM GOAL  #13   TITLE  Patient will be able to complete 2 1/2 minutes straight with left shoulder flexion simulating driving with a stearing wheel ball to be able to drive to college.     Baseline  Patient has not been driving,  her family takes her to school and drops her off.     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     Baseline  difficulty coordinating the use of both hands to complete task.     Status  Partially Met      OT LONG TERM GOAL  #15   TITLE  Patient will complete meal preparation with minimal assist.     Baseline  max assist    Time  12    Period  Weeks    Status  New            Plan - 09/24/17 1741    Clinical Impression Statement  Patient seen this date for focus on meal planning and making lists of items she would like to be able to prepare.  Addition of details with cues.  She will plan to start with one of the items in the next couple weeks to prepare working on functional mobility, balance and safety in the kitchen,     Occupational performance deficits (Please refer to evaluation for details):  ADL's;IADL's    Rehab Potential  Good    OT Frequency  1x / week    OT Duration  12 weeks    OT Treatment/Interventions  Self-care/ADL training;Therapeutic exercise;Therapeutic exercises;Therapeutic activities;DME and/or AE instruction;Patient/family education    Consulted and Agree with Plan of Care  Patient       Patient will benefit from skilled therapeutic intervention in order to improve the following deficits and impairments:  Impaired UE functional use, Difficulty walking, Decreased strength, Decreased coordination, Decreased endurance, Decreased balance  Visit Diagnosis: Muscle weakness (generalized)  Lack of coordination    Problem List There are no active problems to display for this patient.  Achilles Dunk, OTR/L, CLT  Taniaya Rudder 09/24/2017, 5:44 PM  [place] MAIN [facility] SERVICES [address] [place], [place], [address] Phone: [phone]    Fax:  [phone]  Name: RAECHEL MARCOS MRN: [id number] Date of Birth: 03-Feb-1996

## 2017-09-27 ENCOUNTER — Ambulatory Visit: Payer: Medicaid Other | Admitting: Physical Therapy

## 2017-09-27 ENCOUNTER — Ambulatory Visit: Payer: Medicaid Other | Admitting: Occupational Therapy

## 2017-10-04 ENCOUNTER — Encounter: Payer: Self-pay | Admitting: Physical Therapy

## 2017-10-04 ENCOUNTER — Ambulatory Visit: Payer: Medicaid Other | Admitting: Physical Therapy

## 2017-10-04 ENCOUNTER — Encounter: Payer: Self-pay | Admitting: Occupational Therapy

## 2017-10-04 ENCOUNTER — Ambulatory Visit: Payer: Medicaid Other | Admitting: Occupational Therapy

## 2017-10-04 DIAGNOSIS — M6281 Muscle weakness (generalized): Secondary | ICD-10-CM | POA: Diagnosis not present

## 2017-10-04 DIAGNOSIS — R278 Other lack of coordination: Secondary | ICD-10-CM

## 2017-10-04 DIAGNOSIS — R262 Difficulty in walking, not elsewhere classified: Secondary | ICD-10-CM

## 2017-10-04 DIAGNOSIS — R279 Unspecified lack of coordination: Secondary | ICD-10-CM

## 2017-10-04 NOTE — Therapy (Signed)
[place] MAIN [facility] SERVICES [address] [place], [place], [address] Phone: [phone]   Fax:  [phone]  Physical Therapy Treatment  Patient Details  Name: Michelle Randall MRN: [id number] Date of Birth: 1996-07-16 Referring Provider: Langley Gauss MD   Encounter Date: 10/04/2017  PT End of Session - 10/04/17 1116    Visit Number  107    Number of Visits  110    Date for PT Re-Evaluation  10/11/17    Authorization Type  9/12    Authorization Time Period  12 visits 9/13-12/5    PT Start Time  1020    PT Stop Time  1100    PT Time Calculation (min)  40 min    Equipment Utilized During Treatment  Gait belt    Activity Tolerance  Patient tolerated treatment well    Behavior During Therapy  [facility] for tasks assessed/performed       History reviewed. No pertinent past medical history.  History reviewed. No pertinent surgical history.  There were no vitals filed for this visit.  Subjective Assessment - 10/04/17 1114    Subjective  Pt reports she is doing well today. Denies pain. No specific questions or concerns. consistent with HEP. She is walking on the TM on the weekends only. she reports that shee occassionally has help to stretch her hip flexors.     Patient is accompained by:  Family member    Limitations  Walking    Patient Stated Goals  Patient wants to improve her core strength.     Currently in Pain?  No/denies    Pain Score  0-No pain    Pain Onset  In the past 7 days    Multiple Pain Sites  No       TREATMENT  Ther-ex  Supine marches with TA contraction 2 x 15 B LEs;  Bridges with TA contraction 2 x 15 reps; Hooklying unilateral hip abd with stabilization of other LE x 20 BLE Supine hip abduction with manual resistance at ankles 2 x 15 bilateral; Sidelying hip abd with assist BLE x 20  Sit to stand x 20 from low mat level  Sit to stand with foam under feet x 20 from low mat  Standing in parallel bars and  side stepping x 10 feet x 5 left and right  Intermittent cues required for form/technique throughout exercises. Assist required for hip abd in sidelying.                     PT Education - 10/04/17 1115    Education provided  Yes    Education Details  stretching for HEP    Person(s) Educated  Patient    Methods  Explanation;Verbal cues;Tactile cues    Comprehension  Verbalized understanding;Returned demonstration          PT Long Term Goals - 10/04/17 1122      PT LONG TERM GOAL #1   Title  Patient will improve Dynamic Gait Index (DGI) score to > 21/24 for meaningful improvement and low falls risk regarding dynamic walking tasks (revised from > 19/24 )    Baseline  14/24; 11/02/16: 15/24 01/28/17: 18/24 6/5: 19/24; 08/23/17 14/24    Time  12    Period  Weeks    Status  On-going    Target Date  10/11/17      PT LONG TERM GOAL #2   Title  Patient will increase Berg Balance score  by >51/56 points to be considered a low risk for falls for improved safety. (revised from >6 points improvement)    Baseline  11/02/16: 34/56 01/28/17: 39/56 6/5: 45/56; 08/23/2017 = 45/56    Time  12    Period  Weeks    Status  On-going    Target Date  10/11/17      PT LONG TERM GOAL #3   Title  Patient will be able to transfer in and out of a large car with  a high seat with CGA  to improve ability to go to school/doctor visits.     Baseline  Patient requires min A for transfer into large SUV unless it has a step rail on the side    Time  12    Period  Weeks    Status  On-going    Target Date  10/11/17      PT LONG TERM GOAL #4   Title  Patient will complete a TUG test in < 12 seconds for independent mobility and decreased fall risk     Baseline  11.45; 08/23/2017 = 13.66 sec    Time  12    Period  Weeks    Status  On-going    Target Date  10/11/17      PT LONG TERM GOAL #5   Title  Patient will improve 6 minute walk distance by > 150 ft for improved return to functional  community activities     Baseline  740 01/28/2017: 734f 6/5: 795  730 on 07/19/17; 790 ft on 08/23/18    Time  12    Status  On-going    Target Date  10/11/17      PT LONG TERM GOAL #6   Title  Patient will improve gait speed to > 1.2 m/s with least restrictive assistive device to return to normal walking speed     Baseline  .76 m/h 01/28/2017: .87  07/19/17 0.638m; 0.77 m/s on 08/23/17    Time  12    Period  Weeks    Status  On-going 10/11/17 target date      PT LONG TERM GOAL #7   Title  Patient (< 6032ears old) will complete five times sit to stand test in < 10 seconds indicating an increased LE strength and improved balance     Baseline  9.45 sec; 9.63 on 08/23/17    Time  12    Period  Weeks    Status  Achieved      PT LONG TERM GOAL #8   Title  Patient will be able to ambulate on inclines and grass independenlty with LRAD    Baseline  Can ambulate over grassy inclines with decreased speed and safety requiring need loftstrand curtches and SBA    Time  12    Period  Weeks    Status  On-going 10/11/17 target date      PT LONG TERM GOAL  #9   TITLE  Patient will be abe to transfer from low chair or stool without UE support independently    Baseline  Patient needs min A assist to transfer from a low stool    Time  12    Period  Weeks    Status  Partially Met 10/11/17 target date      PT LONG TERM GOAL  #10   TITLE  Patient will be able to transfer from the floor to standing independently with use of LRAD    Baseline  Patient able to stand from the floor independently if she has not been on the floor longer than about 5 min due to stiffness with prolonged positioning    Time  12    Period  Weeks    Status  On-going 10/11/17 target date      PT LONG TERM GOAL  #11   TITLE  Patient will step up onto a 6" step 5x with AD independently to increase community mobility    Baseline  Pt. has increased difficulty requiring supervision for safety, but no physical assist    Time  12     Period  Weeks    Status  On-going 10/11/17 target date            Plan - 10/04/17 1116    Clinical Impression Statement  Patient instructed in advanced balance/strengthening exercise. Patient fatigues quickly requiring short rest breaks in between advanced exercise. Patient instructed in advanced strengthening with increased repetition/resistance. Patient requires CGA for advanced balance exercise especially with less rail assist. She was instructed in improtance of stretching hip flexor muscles to prevent trunk flexion during standing and walking. Patient would benefit from additional skilled PT intervention to improve balance/gait safety and reduce fall risk    Rehab Potential  Good    Clinical Impairments Affecting Rehab Potential  weakness and decreased standing balance    PT Frequency  1x / week    PT Duration  12 weeks    PT Treatment/Interventions  Therapeutic exercise;Therapeutic activities;Gait training;Balance training;Stair training;DME Instruction;Neuromuscular re-education;Patient/family education    PT Next Visit Plan  Continue POC    PT Home Exercise Plan  added seated dynamic core UE and LE lift offs with visual cues using mirror, supine marches    Consulted and Agree with Plan of Care  Patient       Patient will benefit from skilled therapeutic intervention in order to improve the following deficits and impairments:  Abnormal gait, Decreased balance, Decreased endurance, Difficulty walking, Decreased strength  Visit Diagnosis: Muscle weakness (generalized)  Lack of coordination  Difficulty in walking, not elsewhere classified  Other lack of coordination     Problem List There are no active problems to display for this patient.   [address] [place] DPT 10/04/2017, 11:25 AM  [place] MAIN [facility] SERVICES [place], [place], [address] Phone: [phone]   Fax:  [phone]  Name: LESTINE RAHE MRN: [id number] Date of Birth: Oct 11, 1996

## 2017-10-04 NOTE — Therapy (Signed)
[place] MAIN [facility] SERVICES [address] [place], [place], [address] Phone: [phone]   Fax:  [phone]  Occupational Therapy Treatment  Patient Details  Name: Michelle Randall MRN: [id number] Date of Birth: 1996/10/29 No Data Recorded  Encounter Date: 10/04/2017  OT End of Session - 10/04/17 0938    Visit Number  98    Number of Visits  118    Date for OT Re-Evaluation  11/25/17    Authorization Type  medicaid visit 40 of 118    OT Start Time  0932    OT Stop Time  1017    OT Time Calculation (min)  45 min    Activity Tolerance  Patient tolerated treatment well    Behavior During Therapy  [facility] for tasks assessed/performed       History reviewed. No pertinent past medical history.  History reviewed. No pertinent surgical history.  There were no vitals filed for this visit.  Subjective Assessment - 10/04/17 0937    Subjective   Had some difficulty managing the tongs at the salad bar at school and would like to work on this skill. Some overnight trips planned alone for a conference, one in [place].    Patient Stated Goals  To be as independent as possible at home and at school.     Currently in Pain?  No/denies    Pain Score  0-No pain                   OT Treatments/Exercises (OP) - 10/04/17 1549      ADLs   ADL Comments  Patient seen for use of tongs to pick up, move and turn condiment packets.  Utilized tongs to flatten out packages with cues.        Fine Motor Coordination   Other Fine Motor Exercises  Patient seen for manipulation of pennies and placing into bank roll with min assist for finger positioning for roll and starting coins in sleeve.        Neurological Re-education Exercises   Other Exercises 1  Patient seen for sustained Grip strength on 4th setting of 23.4# for 25 repetitions completed 2 sets, cues for technique and hand position on gripper.             OT Education - 10/04/17 1550     Education provided  Yes    Education Details  use of tongs, rolling coins    Person(s) Educated  Patient    Methods  Explanation;Demonstration;Verbal cues    Comprehension  Verbal cues required;Returned demonstration;Verbalized understanding          OT Long Term Goals - 09/20/17 0929      OT LONG TERM GOAL #1   Title  Patient will demonstrate increased strength in right hand sufficient to scoop ice cream from frozen container with modified independence.    Baseline  goal update:  patient now able to complete with minimal assist at times.    Time  12    Period  Months    Status  On-going      OT LONG TERM GOAL #2   Title  Patient will be able to make her bed with modified independence.    Baseline  she continues to have difficulty with balance and completing this task, increased time, minimal assist at goal update, decreased time in the mornings to perform due to self care demands    Time  6  Period  Months    Status  On-going      OT LONG TERM GOAL #3   Title  Patient will demonstrate the ability to perform vacuuming of one room with modified independence using light weight vacuum.    Baseline  moderate assist at goal update.    Time  6    Period  Months    Status  Deferred      OT LONG TERM GOAL #4   Title  Patient will demonstrate the ability to open containers with sealed tops such as on yogurt containers and individual ice cream containers.     Baseline  goal update:  occasional assistance required but able to complete more than 50% of the time with modified independence.     Time  12    Period  Weeks    Status  Achieved      OT LONG TERM GOAL #5   Title  Patient will demonstrate the ability to perform small buttons on shirts independently and with good speed in order to dress for school.    Baseline  goal update: Patient can perform with increased time but occasionally needs assist if rushed.    Time  12    Period  Weeks    Status  Partially Met      OT LONG  TERM GOAL #6   Title  Patient will donn and doff small earrings with modified independence.    Baseline  Goal update:  able to hold small backs and dropping only 20% of the time, slow to complete but improving.  Min assist     Time  12    Period  Weeks    Status  Partially Met      OT LONG TERM GOAL #7   Title  Patient will be able to zip her rainboots while they are on her feet with modified independence.      Baseline  patient unable to fit into current boots secondary to new AFOs    Time  12    Period  Weeks    Status  On-going      OT LONG TERM GOAL #8   Title  Patient to complete am self care demonstrating efficient methods of dressing skills to get to school on time.     Baseline  Goal update:  patient continues to work towards efficiency and was late 3 time in the last 2 weeks.    Time  12    Period  Weeks    Status  Partially Met      OT LONG TERM GOAL  #9   Baseline  Patient will don/doff socks and AFO braces with modified independence using adaptive equipment as needed.  (Baseline min assist)    Time  12    Period  Weeks    Status  New      OT LONG TERM GOAL  #10   TITLE  Patient will demonstrate the ability to pull jeans down over AFOs with modified independence.     Baseline   min assist    Time  12    Period  Weeks    Status  On-going      OT LONG TERM GOAL  #11   TITLE  Will be able to squeeze tooth paste out of the tube when it is less than half full.    Baseline  difficulty with small travel sized, may look into alternative ways since this has been a chronic  issue.    Time  6    Period  Months    Status  Achieved      OT LONG TERM GOAL  #12   TITLE  Patient will demonstrate managing wheelchair rain poncho in inclement weather with modified independence.    Baseline  crutch tips are difficult for anyone to take them off, goal deferred.    Time  6    Period  Months    Status  Achieved      OT LONG TERM GOAL  #13   TITLE  Patient will be able to complete  2 1/2 minutes straight with left shoulder flexion simulating driving with a stearing wheel ball to be able to drive to college.     Baseline  Patient has not been driving, her family takes her to school and drops her off.     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     Baseline  difficulty coordinating the use of both hands to complete task.     Status  Partially Met      OT LONG TERM GOAL  #15   TITLE  Patient will complete meal preparation with minimal assist.     Baseline  max assist    Time  12    Period  Weeks    Status  New            Plan - 10/04/17 0940    Clinical Impression Statement  Patient had difficulty with managing use of tongs for getting condiments to place on her burger at school.  Worked on this skill today with right hand and with a variety of movement patterns to include pronation/supination.  Patient has difficulty with rolling coins and getting the coins started in the sleeve.  She also has some minimal difficulty with closing the ends of the rolls.  Continue to work on hand function to improve use in daily tasks at home and school.     Rehab Potential  Good    OT Frequency  1x / week    OT Duration  12 weeks    OT Treatment/Interventions  Self-care/ADL training;Therapeutic exercise;Therapeutic exercises;Therapeutic activities;DME and/or AE instruction;Patient/family education    Consulted and Agree with Plan of Care  Patient       Patient will benefit from skilled therapeutic intervention in order to improve the following deficits and impairments:  Impaired UE functional use, Difficulty walking, Decreased strength, Decreased coordination, Decreased endurance, Decreased balance  Visit Diagnosis: Muscle weakness (generalized)  Lack of coordination    Problem  List There are no active problems to display for this patient.  Achilles Dunk, OTR/L, CLT  Lovett,Amy 10/04/2017, 3:54 PM  [place] MAIN [facility] SERVICES [address] [place], [place], [address] Phone: [phone]   Fax:  [phone]  Name: VYLA PINT MRN: [id number] Date of Birth: Apr 29, 1996

## 2017-10-11 ENCOUNTER — Ambulatory Visit: Payer: Medicaid Other | Admitting: Physical Therapy

## 2017-10-11 ENCOUNTER — Encounter: Payer: Self-pay | Admitting: Physical Therapy

## 2017-10-11 ENCOUNTER — Encounter: Payer: Self-pay | Admitting: Occupational Therapy

## 2017-10-11 ENCOUNTER — Ambulatory Visit: Payer: Medicaid Other | Admitting: Occupational Therapy

## 2017-10-11 DIAGNOSIS — M6281 Muscle weakness (generalized): Secondary | ICD-10-CM | POA: Diagnosis not present

## 2017-10-11 DIAGNOSIS — R460 Very low level of personal hygiene: Secondary | ICD-10-CM

## 2017-10-11 DIAGNOSIS — R278 Other lack of coordination: Secondary | ICD-10-CM

## 2017-10-11 DIAGNOSIS — R262 Difficulty in walking, not elsewhere classified: Secondary | ICD-10-CM

## 2017-10-11 DIAGNOSIS — Z9181 History of falling: Secondary | ICD-10-CM

## 2017-10-11 DIAGNOSIS — R531 Weakness: Secondary | ICD-10-CM

## 2017-10-11 DIAGNOSIS — Z741 Need for assistance with personal care: Secondary | ICD-10-CM

## 2017-10-11 DIAGNOSIS — R279 Unspecified lack of coordination: Secondary | ICD-10-CM

## 2017-10-11 NOTE — Therapy (Signed)
[place] MAIN [facility] SERVICES [address] [place], [place], [address] Phone: [phone]   Fax:  [phone]  Physical Therapy Treatment  Patient Details  Name: Michelle Randall MRN: [id number] Date of Birth: 1996-07-11 Referring Provider: Langley Gauss MD   Encounter Date: 10/11/2017  PT End of Session - 10/11/17 1233    Visit Number  108    Number of Visits  134    Date for PT Re-Evaluation  01/03/18    Authorization Type  10/12    Authorization Time Period  12 visits 9/13-12/5    PT Start Time  0905    PT Stop Time  0945    PT Time Calculation (min)  40 min    Equipment Utilized During Treatment  Gait belt    Activity Tolerance  Patient tolerated treatment well    Behavior During Therapy  [facility] for tasks assessed/performed       History reviewed. No pertinent past medical history.  History reviewed. No pertinent surgical history.  There were no vitals filed for this visit.  Subjective Assessment - 10/11/17 0904    Subjective  Pt reports she is doing well today. Denies pain. No specific questions or concerns. consistent with HEP. She is walking on the TM on the weekends only. she reports that she occassionally has help to stretch her hip flexors.     Patient is accompained by:  Family member    Limitations  Walking    Patient Stated Goals  Patient wants to improve her core strength.     Currently in Pain?  No/denies    Pain Score  0-No pain    Pain Onset  In the past 7 days    Multiple Pain Sites  No       Treatment:  Goals were reviewed with ongoing results for LTG #1 - #11 and # 7 achieved.                        PT Education - 10/11/17 0904    Education provided  Yes    Education Details  HEP    Person(s) Educated  Patient    Methods  Explanation;Demonstration    Comprehension  Verbalized understanding;Returned demonstration          PT Long Term Goals - 10/11/17 0907      PT LONG  TERM GOAL #1   Title  Patient will improve Dynamic Gait Index (DGI) score to > 21/24 for meaningful improvement and low falls risk regarding dynamic walking tasks (revised from > 19/24 )    Baseline  14/24; 11/02/16: 15/24 01/28/17: 18/24 6/5: 19/24; 08/23/17 14/24,  14/24 10/11/17    Time  12    Period  Weeks    Status  On-going    Target Date  01/03/18      PT LONG TERM GOAL #2   Title  Patient will increase Berg Balance score by >51/56 points to be considered a low risk for falls for improved safety. (revised from >6 points improvement)    Baseline  11/02/16: 34/56 01/28/17: 39/56 6/5: 45/56; 08/23/2017 = 45/56, 43/56 10/11/17    Time  12    Period  Weeks    Status  On-going    Target Date  01/03/18      PT LONG TERM GOAL #3   Title  Patient will be able to transfer in and out of a large car with  a  high seat with CGA  to improve ability to go to school/doctor visits.     Baseline  Patient requires min A for transfer into large SUV unless it has a step rail on the side    Time  12    Period  Weeks    Status  On-going    Target Date  01/03/18      PT LONG TERM GOAL #4   Title  Patient will complete a TUG test in < 12 seconds for independent mobility and decreased fall risk     Baseline  11.45; 08/23/2017 = 13.66 sec    Time  12    Period  Weeks    Status  On-going    Target Date  01/03/18      PT LONG TERM GOAL #5   Title  Patient will improve 6 minute walk distance by > 150 ft for improved return to functional community activities     Baseline  740 01/28/2017: 760f 6/5: 795  730 on 07/19/17; 790 ft on 08/23/18,  740 feet    Time  12    Period  Weeks    Status  On-going    Target Date  01/03/18      PT LONG TERM GOAL #6   Title  Patient will improve gait speed to > 1.2 m/s with least restrictive assistive device to return to normal walking speed     Baseline  .76 m/h 01/28/2017: .87  07/19/17 0.671m; 0.77 m/s on 08/23/17, . 58 m/sec 10/11/17    Time  12    Period  Weeks    Status   On-going 01/03/18 target dat      PT LONG TERM GOAL #7   Title  Patient (< [age]ears old) will complete five times sit to stand test in < 10 seconds indicating an increased LE strength and improved balance     Baseline  9.45 sec; 9.63 on 08/23/17, 9.63 sec    Time  12    Period  Weeks    Status  Achieved      PT LONG TERM GOAL #8   Title  Patient will be able to ambulate on inclines and grass independenlty with LRAD    Baseline  Can ambulate over grassy inclines with decreased speed and safety requiring need loftstrand curtches and SBA    Time  12    Period  Weeks    Status  On-going 01/03/18 target date      PT LONG TERM GOAL  #9   TITLE  Patient will be abe to transfer from low chair or stool without UE support independently    Baseline  Patient needs min A assist to transfer from a low stool    Time  12    Period  Weeks    Status  Partially Met 10/11/17 target date      PT LONG TERM GOAL  #10   TITLE  Patient will be able to transfer from the floor to standing independently with use of LRAD    Baseline  Patient able to stand from the floor independently if she has not been on the floor longer than about 5 min due to stiffness with prolonged positioning    Time  12    Period  Weeks    Status  On-going 01/03/18 target date      PT LONG TERM GOAL  #11   TITLE  Patient will step up onto a 6" step 5x  with AD independently to increase community mobility    Baseline  Pt. has increased difficulty requiring supervision for safety, but no physical assist    Time  12    Period  Weeks    Status  On-going 01/03/18 target date            Plan - 10/11/17 1236    Clinical Impression Statement Patient performs outcome measures and all goals were assessed. She has decreased DGI, decreased BERG balance test, decreased 6 MW test, decreased 5 x sit to stand,. She ambulates with loftstrand cructhes on level, inclines independently and needs supervision for uneven surfaces . She has decreased gait  speed and BLE and core weakness. She has decreased hip flexor flexibility adding to a flexed trunk gait position during ambulation. She has difficulty with ascending steps and needs a railing with inabiity to perform reciprocal step gait on steps. She has decreased static and dynamic standing balance and is a falls risk. She will continue to benefit from skilled PT to improve her quality of life and reduce her falls risk and improve her gait quality.     Rehab Potential  Good    Clinical Impairments Affecting Rehab Potential  weakness and decreased standing balance    PT Frequency  1x / week    PT Duration  12 weeks    PT Treatment/Interventions  Therapeutic exercise;Therapeutic activities;Gait training;Balance training;Stair training;DME Instruction;Neuromuscular re-education;Patient/family education    PT Next Visit Plan  Continue POC    PT Home Exercise Plan  added seated dynamic core UE and LE lift offs with visual cues using mirror, supine marches    Consulted and Agree with Plan of Care  Patient       Patient will benefit from skilled therapeutic intervention in order to improve the following deficits and impairments:  Abnormal gait, Decreased balance, Decreased endurance, Difficulty walking, Decreased strength  Visit Diagnosis: Muscle weakness (generalized)  Lack of coordination  Difficulty in walking, not elsewhere classified  Other lack of coordination  Self-care deficit for dressing and grooming  Weakness generalized  Personal history of fall     Problem List There are no active problems to display for this patient.   [address] [place] DPT 10/11/2017, 12:43 PM  [place] MAIN [facility] SERVICES [address] [place], [place], [address] Phone: [phone]   Fax:  [phone]  Name: Michelle Randall MRN: [id number] Date of Birth: 09-Mar-1996

## 2017-10-14 NOTE — Therapy (Signed)
[place] MAIN [facility] SERVICES [address] [place], [place], [address] Phone: [phone]   Fax:  [phone]  Occupational Therapy Treatment  Patient Details  Name: DEVONDA PEQUIGNOT MRN: [id number] Date of Birth: [birth year] No Data Recorded  Encounter Date: 10/11/2017  OT End of Session - 10/14/17 1708    Visit Number  27    Number of Visits  118    Date for OT Re-Evaluation  11/25/17    Authorization Type  medicaid visit 55 of 41    OT Start Time  1000    OT Stop Time  1045    OT Time Calculation (min)  45 min    Activity Tolerance  Patient tolerated treatment well    Behavior During Therapy  [facility] for tasks assessed/performed       History reviewed. No pertinent past medical history.  History reviewed. No pertinent surgical history.  There were no vitals filed for this visit.  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      Patient Stated Goals  To be as independent as possible at home and at school.     Currently in Pain?  No/denies    Pain Score  0-No pain                   OT Treatments/Exercises (OP) - 10/14/17 1705      Fine Motor Coordination   Other Fine Motor Exercises  Patient seen for manipulation of pennies and placing into bank roll with min assist for finger positioning for roll and starting coins in sleeve.      Other Fine Motor Exercises  Patient sorting and counting coins with isolated finger movements, cues provided for speed and dexterity skills.       Neurological Re-education Exercises   Other Exercises 1  2# weight for overhead press, chest press, ABD/ADD, elbow flexion/extension for 10 repetitions for 2 sets with cues for form and technique.     Other Exercises 2  Patient seen for green resistive putty for grip strengthening in each hand, lateral pinch, 3 point and 2 point pinch  with cues for prehension patterns and technique.               OT Education - 10/14/17 1707    Education provided  Yes    Education Details  putty exercises, coin counting and rolling    Person(s) Educated  Patient    Methods  Explanation;Demonstration;Verbal cues    Comprehension  Verbal cues required;Returned demonstration;Verbalized understanding          OT Long Term Goals - 09/20/17 0929      OT LONG TERM GOAL #1   Title  Patient will demonstrate increased strength in right hand sufficient to scoop ice cream from frozen container with modified independence.    Baseline  goal update:  patient now able to complete with minimal assist at times.    Time  12    Period  Months    Status  On-going      OT LONG TERM GOAL #2   Title  Patient will be able to make her bed with modified independence.    Baseline  she continues to have difficulty with balance and completing this task, increased time, minimal assist at goal update, decreased time in the mornings to perform  due to self care demands    Time  6    Period  Months    Status  On-going      OT LONG TERM GOAL #3   Title  Patient will demonstrate the ability to perform vacuuming of one room with modified independence using light weight vacuum.    Baseline  moderate assist at goal update.    Time  6    Period  Months    Status  Deferred      OT LONG TERM GOAL #4   Title  Patient will demonstrate the ability to open containers with sealed tops such as on yogurt containers and individual ice cream containers.     Baseline  goal update:  occasional assistance required but able to complete more than 50% of the time with modified independence.     Time  12    Period  Weeks    Status  Achieved      OT LONG TERM GOAL #5   Title  Patient will demonstrate the ability to perform small buttons on shirts independently and with good speed in order to dress for school.    Baseline  goal update: Patient can perform with increased  time but occasionally needs assist if rushed.    Time  12    Period  Weeks    Status  Partially Met      OT LONG TERM GOAL #6   Title  Patient will donn and doff small earrings with modified independence.    Baseline  Goal update:  able to hold small backs and dropping only 20% of the time, slow to complete but improving.  Min assist     Time  12    Period  Weeks    Status  Partially Met      OT LONG TERM GOAL #7   Title  Patient will be able to zip her rainboots while they are on her feet with modified independence.      Baseline  patient unable to fit into current boots secondary to new AFOs    Time  12    Period  Weeks    Status  On-going      OT LONG TERM GOAL #8   Title  Patient to complete am self care demonstrating efficient methods of dressing skills to get to school on time.     Baseline  Goal update:  patient continues to work towards efficiency and was late 3 time in the last 2 weeks.    Time  12    Period  Weeks    Status  Partially Met      OT LONG TERM GOAL  #9   Baseline  Patient will don/doff socks and AFO braces with modified independence using adaptive equipment as needed.  (Baseline min assist)    Time  12    Period  Weeks    Status  New      OT LONG TERM GOAL  #10   TITLE  Patient will demonstrate the ability to pull jeans down over AFOs with modified independence.     Baseline   min assist    Time  12    Period  Weeks    Status  On-going      OT LONG TERM GOAL  #11   TITLE  Will be able to squeeze tooth paste out of the tube when it is less than half full.    Baseline  difficulty with  small travel sized, may look into alternative ways since this has been a chronic issue.    Time  6    Period  Months    Status  Achieved      OT LONG TERM GOAL  #12   TITLE  Patient will demonstrate managing wheelchair rain poncho in inclement weather with modified independence.    Baseline  crutch tips are difficult for anyone to take them off, goal deferred.     Time  6    Period  Months    Status  Achieved      OT LONG TERM GOAL  #13   TITLE  Patient will be able to complete 2 1/2 minutes straight with left shoulder flexion simulating driving with a stearing wheel ball to be able to drive to college.     Baseline  Patient has not been driving, her family takes her to school and drops her off.     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     Baseline  difficulty coordinating the use of both hands to complete task.     Status  Partially Met      OT LONG TERM GOAL  #15   TITLE  Patient will complete meal preparation with minimal assist.     Baseline  max assist    Time  12    Period  Weeks    Status  New            Plan - 10/14/17 1708    Clinical Impression Statement  Patient continues to work towards hand skills for improved strength, ROM, coordination and manipulation of items for home and school. She needs to continue to focus on speed and dexterity of movements.  Will plan to work towards improving self care and IADL tasks in sitting and standing.     Occupational performance deficits (Please refer to evaluation for details):  ADL's;IADL's;Leisure    Rehab Potential  Good    OT Frequency  1x / week    OT Duration  12 weeks    OT Treatment/Interventions  Self-care/ADL training;DME and/or AE instruction;Therapeutic activities;Therapeutic exercise;Neuromuscular education;Patient/family education    Consulted and Agree with Plan of Care  Patient       Patient will benefit from skilled therapeutic intervention in order to improve the following deficits and impairments:  Impaired UE functional use, Difficulty walking, Decreased strength, Decreased coordination, Decreased endurance, Decreased balance  Visit Diagnosis: Muscle weakness  (generalized)  Other lack of coordination    Problem List There are no active problems to display for this patient.  Achilles Dunk, OTR/L, CLT  [facility] 10/14/2017, 5:11 PM  [place] MAIN [facility] SERVICES [place], [place], [address] Phone: [phone]   Fax:  [phone]  Name: BREUNNA NORDMANN MRN: [id number] Date of Birth: Nov 12, 1996

## 2017-10-18 ENCOUNTER — Encounter: Payer: Self-pay | Admitting: Physical Therapy

## 2017-10-18 ENCOUNTER — Ambulatory Visit: Payer: Medicaid Other | Attending: Student | Admitting: Physical Therapy

## 2017-10-18 ENCOUNTER — Ambulatory Visit: Payer: Medicaid Other | Admitting: Occupational Therapy

## 2017-10-18 DIAGNOSIS — R262 Difficulty in walking, not elsewhere classified: Secondary | ICD-10-CM | POA: Insufficient documentation

## 2017-10-18 DIAGNOSIS — M6281 Muscle weakness (generalized): Secondary | ICD-10-CM

## 2017-10-18 DIAGNOSIS — R278 Other lack of coordination: Secondary | ICD-10-CM

## 2017-10-18 DIAGNOSIS — R279 Unspecified lack of coordination: Secondary | ICD-10-CM | POA: Insufficient documentation

## 2017-10-18 NOTE — Therapy (Signed)
[place] [facility] MAIN REHAB SERVICES [address] [place], [place], [address] Phone: [phone]   Fax:  [phone]  Physical Therapy Treatment  Patient Details  Name: Michelle Randall MRN: [id number] Date of Birth: 01/19/1996 Referring Provider: Sator Nogo, Jasna MD   Encounter Date: 10/18/2017  PT End of Session - 10/18/17 1147    Visit Number  109    Number of Visits  134    Date for PT Re-Evaluation  01/03/18    Authorization Type  10/12    Authorization Time Period  12 visits 9/13-12/5    PT Start Time  0935    PT Stop Time  1015    PT Time Calculation (min)  40 min    Equipment Utilized During Treatment  Gait belt    Activity Tolerance  Patient tolerated treatment well    Behavior During Therapy  [facility] for tasks assessed/performed       History reviewed. No pertinent past medical history.  History reviewed. No pertinent surgical history.  There were no vitals filed for this visit.  Subjective Assessment - 10/18/17 1133    Subjective  Pt has no pain and reports no problems upon arrival. She reports compliance with HEP.    Limitations  Walking    Patient Stated Goals  Patient wants to improve her core strength.     Currently in Pain?  No/denies    Pain Score  0-No pain                      [facility] Adult PT Treatment/Exercise - 10/18/17 0001      Neuro Re-ed    Neuro Re-ed Details   -- side-stepping and fwd amb on line in // bars with rails B      Lumbar Exercises: Seated   Sit to Stand  -- x20 from low mat; x20 from mat with AirEX      Lumbar Exercises: Supine   Bridge  -- with TA contraction 2x10    Other Supine Lumbar Exercises  supine marches with TA sqz 2x15B    Other Supine Lumbar Exercises  Hooklying uni hip abd with stab of other LE x20, supine hip abd with RTB around ankles 2x15B      Lumbar Exercises: Sidelying   Hip Abduction  -- 2x10 B with assistance for lift                  PT Long  Term Goals - 10/18/17 1146      PT LONG TERM GOAL #1   Title  Patient will improve Dynamic Gait Index (DGI) score to > 21/24 for meaningful improvement and low falls risk regarding dynamic walking tasks (revised from > 19/24 )    Baseline  14/24; 11/02/16: 15/24 01/28/17: 18/24 6/5: 19/24; 08/23/17 14/24,  14/24 10/11/17    Time  12    Period  Weeks    Status  On-going      PT LONG TERM GOAL #2   Title  Patient will increase Berg Balance score by >51/56 points to be considered a low risk for falls for improved safety. (revised from >6 points improvement)    Baseline  11/02/16: 34/56 01/28/17: 39/56 6/5: 45/56; 08/23/2017 = 45/56, 43/56 10/11/17    Time  12    Period  Weeks    Status  On-going      PT LONG TERM GOAL #3   Title  Patient will be able to   transfer in and out of a large car with  a high seat with CGA  to improve ability to go to school/doctor visits.     Baseline  Patient requires min A for transfer into large SUV unless it has a step rail on the side    Time  12    Period  Weeks    Status  On-going      PT LONG TERM GOAL #4   Title  Patient will complete a TUG test in < 12 seconds for independent mobility and decreased fall risk     Baseline  11.45; 08/23/2017 = 13.66 sec    Time  12    Period  Weeks    Status  On-going      PT LONG TERM GOAL #5   Title  Patient will improve 6 minute walk distance by > 150 ft for improved return to functional community activities     Baseline  740 01/28/2017: 75f 6/5: 795  730 on 07/19/17; 790 ft on 08/23/18,  740 feet    Time  12    Period  Weeks    Status  On-going      PT LONG TERM GOAL #6   Title  Patient will improve gait speed to > 1.2 m/s with least restrictive assistive device to return to normal walking speed     Baseline  .76 m/h 01/28/2017: .87  07/19/17 0.654m; 0.77 m/s on 08/23/17, . 58 m/sec 10/11/17    Time  12    Period  Weeks    Status  On-going 01/03/18 target dat      PT LONG TERM GOAL #7   Title  Patient (< 6033ears  old) will complete five times sit to stand test in < 10 seconds indicating an increased LE strength and improved balance     Baseline  9.45 sec; 9.63 on 08/23/17, 9.63 sec    Time  12    Period  Weeks    Status  Achieved      PT LONG TERM GOAL #8   Title  Patient will be able to ambulate on inclines and grass independenlty with LRAD    Baseline  Can ambulate over grassy inclines with decreased speed and safety requiring need loftstrand curtches and SBA    Time  12    Period  Weeks    Status  On-going 01/03/18 target date      PT LONG TERM GOAL  #9   TITLE  Patient will be abe to transfer from low chair or stool without UE support independently    Baseline  Patient needs min A assist to transfer from a low stool    Time  12    Period  Weeks    Status  Partially Met 10/11/17 target date      PT LONG TERM GOAL  #10   TITLE  Patient will be able to transfer from the floor to standing independently with use of LRAD    Baseline  Patient able to stand from the floor independently if she has not been on the floor longer than about 5 min due to stiffness with prolonged positioning    Time  12    Period  Weeks    Status  On-going 01/03/18 target date      PT LONG TERM GOAL  #11   TITLE  Patient will step up onto a 6" step 5x with AD independently to increase community mobility  Baseline  Pt. has increased difficulty requiring supervision for safety, but no physical assist    Time  12    Period  Weeks    Status  On-going 01/03/18 target date            Plan - 10/18/17 1148    Clinical Impression Statement  Pt showed some fatigue with sit-to-stands ther ex today.  She was able to complete both trials on and off AirEx.  Continue to work on LE strengthening and balance at next visit.    Clinical Presentation  Stable    Clinical Decision Making  Low    Rehab Potential  Good    Clinical Impairments Affecting Rehab Potential  weakness and decreased standing balance    PT Frequency  1x /  week    PT Duration  12 weeks    PT Treatment/Interventions  Therapeutic exercise;Therapeutic activities;Gait training;Balance training;Stair training;DME Instruction;Neuromuscular re-education;Patient/family education    PT Next Visit Plan  Continue POC    PT Home Exercise Plan  added seated dynamic core UE and LE lift offs with visual cues using mirror, supine marches    Consulted and Agree with Plan of Care  Patient       Patient will benefit from skilled therapeutic intervention in order to improve the following deficits and impairments:  Abnormal gait, Decreased balance, Decreased endurance, Difficulty walking, Decreased strength  Visit Diagnosis: Muscle weakness (generalized)  Other lack of coordination     Problem List There are no active problems to display for this patient.   Jalen Oberry, MPT 10/18/2017, 11:50 AM  [place] MAIN [facility] SERVICES [place], [place], [address] Phone: [phone]   Fax:  [phone]  Name: Michelle Randall MRN: [id number] Date of Birth: Nov 29, 1995

## 2017-10-22 ENCOUNTER — Encounter: Payer: Self-pay | Admitting: Occupational Therapy

## 2017-10-22 NOTE — Therapy (Signed)
[place] MAIN [facility] SERVICES [address] [place], [place], [address] Phone: [phone]   Fax:  [phone]  Occupational Therapy Treatment  Patient Details  Name: Michelle Randall MRN: [id number] Date of Birth: 10/04/1996 No Data Recorded  Encounter Date: 10/18/2017  OT End of Session - 10/22/17 1011    Visit Number  100    Number of Visits  118    Date for OT Re-Evaluation  11/25/17    Authorization Type  medicaid visit 64 of 98    OT Start Time  1015    OT Stop Time  1100    OT Time Calculation (min)  45 min    Activity Tolerance  Patient tolerated treatment well    Behavior During Therapy  [facility] for tasks assessed/performed       History reviewed. No pertinent past medical history.  History reviewed. No pertinent surgical history.  There were no vitals filed for this visit.  Subjective Assessment - 10/22/17 1005    Subjective   Patient reports she is studying for exams, her last exam will be next monday and she will have a couple months off from college.  She will be going out of town for a conference towards the end of the month.      Patient Stated Goals  To be as independent as possible at home and at school.     Currently in Pain?  No/denies    Pain Score  0-No pain                   OT Treatments/Exercises (OP) - 10/22/17 1006      ADLs   ADL Comments  Patient seen for meal planning for next session with cookie making activity.  Patient making list of ingredients and items to be used for session.  She reports at home she has not placed items in the oven, her mom usually helps with this task. Patient occasionally assists in the kitchen from a seated position and limited participation.       Fine Motor Coordination   Other Fine Motor Exercises  Patient seen for coordination skills with manipulation of 100 pegboard pieces with focus on turning, flipping and placing into grid with cues for speed and technique.             OT Education - 10/22/17 1011    Education provided  Yes    Education Details  [place], meal planning     Person(s) Educated  Patient    Methods  Explanation;Demonstration;Verbal cues    Comprehension  Verbal cues required;Returned demonstration;Verbalized understanding          OT Long Term Goals - 09/20/17 0929      OT LONG TERM GOAL #1   Title  Patient will demonstrate increased strength in right hand sufficient to scoop ice cream from frozen container with modified independence.    Baseline  goal update:  patient now able to complete with minimal assist at times.    Time  12    Period  Months    Status  On-going      OT LONG TERM GOAL #2   Title  Patient will be able to make her bed with modified independence.    Baseline  she continues to have difficulty with balance and completing this task, increased time, minimal assist at goal update, decreased time in the mornings to perform due to self care demands    Time  6    Period  Months    Status  On-going      OT LONG TERM GOAL #3   Title  Patient will demonstrate the ability to perform vacuuming of one room with modified independence using light weight vacuum.    Baseline  moderate assist at goal update.    Time  6    Period  Months    Status  Deferred      OT LONG TERM GOAL #4   Title  Patient will demonstrate the ability to open containers with sealed tops such as on yogurt containers and individual ice cream containers.     Baseline  goal update:  occasional assistance required but able to complete more than 50% of the time with modified independence.     Time  12    Period  Weeks    Status  Achieved      OT LONG TERM GOAL #5   Title  Patient will demonstrate the ability to perform small buttons on shirts independently and with good speed in order to dress for school.    Baseline  goal update: Patient can perform with increased time but occasionally needs assist if rushed.    Time  12    Period  Weeks     Status  Partially Met      OT LONG TERM GOAL #6   Title  Patient will donn and doff small earrings with modified independence.    Baseline  Goal update:  able to hold small backs and dropping only 20% of the time, slow to complete but improving.  Min assist     Time  12    Period  Weeks    Status  Partially Met      OT LONG TERM GOAL #7   Title  Patient will be able to zip her rainboots while they are on her feet with modified independence.      Baseline  patient unable to fit into current boots secondary to new AFOs    Time  12    Period  Weeks    Status  On-going      OT LONG TERM GOAL #8   Title  Patient to complete am self care demonstrating efficient methods of dressing skills to get to school on time.     Baseline  Goal update:  patient continues to work towards efficiency and was late 3 time in the last 2 weeks.    Time  12    Period  Weeks    Status  Partially Met      OT LONG TERM GOAL  #9   Baseline  Patient will don/doff socks and AFO braces with modified independence using adaptive equipment as needed.  (Baseline min assist)    Time  12    Period  Weeks    Status  New      OT LONG TERM GOAL  #10   TITLE  Patient will demonstrate the ability to pull jeans down over AFOs with modified independence.     Baseline   min assist    Time  12    Period  Weeks    Status  On-going      OT LONG TERM GOAL  #11   TITLE  Will be able to squeeze tooth paste out of the tube when it is less than half full.    Baseline  difficulty with small travel sized, may look into alternative ways since this  has been a chronic issue.    Time  6    Period  Months    Status  Achieved      OT LONG TERM GOAL  #12   TITLE  Patient will demonstrate managing wheelchair rain poncho in inclement weather with modified independence.    Baseline  crutch tips are difficult for anyone to take them off, goal deferred.    Time  6    Period  Months    Status  Achieved      OT LONG TERM GOAL  #13    TITLE  Patient will be able to complete 2 1/2 minutes straight with left shoulder flexion simulating driving with a stearing wheel ball to be able to drive to college.     Baseline  Patient has not been driving, her family takes her to school and drops her off.     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     Baseline  difficulty coordinating the use of both hands to complete task.     Status  Partially Met      OT LONG TERM GOAL  #15   TITLE  Patient will complete meal preparation with minimal assist.     Baseline  max assist    Time  12    Period  Weeks    Status  New            Plan - 10/22/17 1011    Clinical Impression Statement  Patient wanting to work towards being able to participate more in the kitchen at home, would like to work on skills to prepare cookies for the holiday and more activities in standing to challenge her balance.  Will plan to work on safety with placing and removing items from the oven.  She continues to work on fine motor coordination and strength in hands to use during functional tasks.     Occupational Profile and client history currently impacting functional performance  decreased hand strength, balance and coordination.    Occupational performance deficits (Please refer to evaluation for details):  ADL's;IADL's;Leisure    Rehab Potential  Good    OT Frequency  1x / week    OT Duration  12 weeks    OT Treatment/Interventions  Self-care/ADL training;DME and/or AE instruction;Therapeutic activities;Therapeutic exercise;Neuromuscular education;Patient/family education    Consulted and Agree with Plan of Care  Patient       Patient will benefit from skilled therapeutic intervention in order to improve the following deficits and impairments:  Impaired UE functional use,  Difficulty walking, Decreased strength, Decreased coordination, Decreased endurance, Decreased balance  Visit Diagnosis: Muscle weakness (generalized)  Other lack of coordination    Problem List There are no active problems to display for this patient.  Achilles Dunk, OTR/L, CLT  Michelle Randall 10/22/2017, 10:15 AM  [place] MAIN [facility] SERVICES [place], [place], [address] Phone: [phone]   Fax:  [phone]  Name: Michelle Randall MRN: [id number] Date of Birth: Nov 15, 1996

## 2017-10-25 ENCOUNTER — Ambulatory Visit: Payer: Medicaid Other | Admitting: Physical Therapy

## 2017-10-25 ENCOUNTER — Ambulatory Visit: Payer: Medicaid Other | Admitting: Occupational Therapy

## 2017-10-26 ENCOUNTER — Ambulatory Visit: Payer: Medicaid Other | Admitting: Physical Therapy

## 2017-10-26 ENCOUNTER — Encounter: Payer: Self-pay | Admitting: Physical Therapy

## 2017-10-26 ENCOUNTER — Ambulatory Visit: Payer: Medicaid Other | Admitting: Occupational Therapy

## 2017-10-26 DIAGNOSIS — R279 Unspecified lack of coordination: Secondary | ICD-10-CM

## 2017-10-26 DIAGNOSIS — M6281 Muscle weakness (generalized): Secondary | ICD-10-CM | POA: Diagnosis not present

## 2017-10-26 DIAGNOSIS — R262 Difficulty in walking, not elsewhere classified: Secondary | ICD-10-CM

## 2017-10-26 DIAGNOSIS — R278 Other lack of coordination: Secondary | ICD-10-CM

## 2017-10-26 NOTE — Therapy (Signed)
[place] MAIN [facility] SERVICES [address] [place], [place], [address] Phone: [phone]   Fax:  [phone]  Physical Therapy Treatment  Patient Details  Name: Michelle Randall MRN: [id number] Date of Birth: Jul 14, 1996 Referring Provider: Langley Gauss MD   Encounter Date: 10/26/2017  PT End of Session - 10/26/17 1123    Visit Number  110    Number of Visits  134    Date for PT Re-Evaluation  01/03/18    Authorization Type  1/12    Authorization Time Period  12 visits 12/11-01/16/18     PT Start Time  1105    PT Stop Time  1145    PT Time Calculation (min)  40 min    Equipment Utilized During Treatment  Gait belt    Activity Tolerance  Patient tolerated treatment well    Behavior During Therapy  [facility] for tasks assessed/performed       History reviewed. No pertinent past medical history.  History reviewed. No pertinent surgical history.  There were no vitals filed for this visit.  Subjective Assessment - 10/26/17 1121    Subjective  Pt has no pain and reports no problems upon arrival. She reports compliance with HEP.    Patient is accompained by:  Family member    Limitations  Walking    Patient Stated Goals  Patient wants to improve her core strength.     Currently in Pain?  No/denies    Pain Score  0-No pain    Pain Onset  In the past 7 days       Treatment: Quadriped BUE extension x 20, cues to weight shift on UE's and try to keep hips level Tall kneelling and sitting on heels and back to full extension x 20, needs cues to weight shift backwards Planks sidelying and prone x 30  Sec x 3 reps, with cues to raise up as high as possible Prone on elbows x 20 with 1 lb weight, cues to extend UE's forward Bridging x 10 x 2, cues to raise up and hold x 2 seconds, cues to clear hips from the mat Tall kneeling and side stepping with UE support on theraball , for balance and min VCS to improve trunk control for better  stance;                       PT Education - 10/26/17 1123    Education provided  Yes    Education Details  HEP    Person(s) Educated  Patient    Methods  Explanation    Comprehension  Verbalized understanding          PT Long Term Goals - 10/18/17 1146      PT LONG TERM GOAL #1   Title  Patient will improve Dynamic Gait Index (DGI) score to > 21/24 for meaningful improvement and low falls risk regarding dynamic walking tasks (revised from > 19/24 )    Baseline  14/24; 11/02/16: 15/24 01/28/17: 18/24 6/5: 19/24; 08/23/17 14/24,  14/24 10/11/17    Time  12    Period  Weeks    Status  On-going      PT LONG TERM GOAL #2   Title  Patient will increase Berg Balance score by >51/56 points to be considered a low risk for falls for improved safety. (revised from >6 points improvement)    Baseline  11/02/16: 34/56 01/28/17: 39/56 6/5: 45/56; 08/23/2017 = 45/56,  43/56 10/11/17    Time  12    Period  Weeks    Status  On-going      PT LONG TERM GOAL #3   Title  Patient will be able to transfer in and out of a large car with  a high seat with CGA  to improve ability to go to school/doctor visits.     Baseline  Patient requires min A for transfer into large SUV unless it has a step rail on the side    Time  12    Period  Weeks    Status  On-going      PT LONG TERM GOAL #4   Title  Patient will complete a TUG test in < 12 seconds for independent mobility and decreased fall risk     Baseline  11.45; 08/23/2017 = 13.66 sec    Time  12    Period  Weeks    Status  On-going      PT LONG TERM GOAL #5   Title  Patient will improve 6 minute walk distance by > 150 ft for improved return to functional community activities     Baseline  740 01/28/2017: 748f 6/5: 795  730 on 07/19/17; 790 ft on 08/23/18,  740 feet    Time  12    Period  Weeks    Status  On-going      PT LONG TERM GOAL #6   Title  Patient will improve gait speed to > 1.2 m/s with least restrictive assistive  device to return to normal walking speed     Baseline  .76 m/h 01/28/2017: .87  07/19/17 0.673m; 0.77 m/s on 08/23/17, . 58 m/sec 10/11/17    Time  12    Period  Weeks    Status  On-going 01/03/18 target dat      PT LONG TERM GOAL #7   Title  Patient (< 6057ears old) will complete five times sit to stand test in < 10 seconds indicating an increased LE strength and improved balance     Baseline  9.45 sec; 9.63 on 08/23/17, 9.63 sec    Time  12    Period  Weeks    Status  Achieved      PT LONG TERM GOAL #8   Title  Patient will be able to ambulate on inclines and grass independenlty with LRAD    Baseline  Can ambulate over grassy inclines with decreased speed and safety requiring need loftstrand curtches and SBA    Time  12    Period  Weeks    Status  On-going 01/03/18 target date      PT LONG TERM GOAL  #9   TITLE  Patient will be abe to transfer from low chair or stool without UE support independently    Baseline  Patient needs min A assist to transfer from a low stool    Time  12    Period  Weeks    Status  Partially Met 10/11/17 target date      PT LONG TERM GOAL  #10   TITLE  Patient will be able to transfer from the floor to standing independently with use of LRAD    Baseline  Patient able to stand from the floor independently if she has not been on the floor longer than about 5 min due to stiffness with prolonged positioning    Time  12    Period  Weeks    Status  On-going 01/03/18 target date      PT LONG TERM GOAL  #11   TITLE  Patient will step up onto a 6" step 5x with AD independently to increase community mobility    Baseline  Pt. has increased difficulty requiring supervision for safety, but no physical assist    Time  12    Period  Weeks    Status  On-going 01/03/18 target date            Plan - 10/26/17 1126    Clinical Impression Statement  Pt instructed in therapeutic exercise including core strengthening exercise, and functional strengthening for sit<>stand  and for step-ups on stairs/curbs. Pt remains limited with step ups with minor instability and max difficulty with a 5" step in the parallel bars. Pt may benefit from alternative step up method to increase community access. Pt will continue to benefit from skilled therapy in order to maximize functional mobility and safety.    Rehab Potential  Good    Clinical Impairments Affecting Rehab Potential  weakness and decreased standing balance    PT Frequency  1x / week    PT Duration  12 weeks    PT Treatment/Interventions  Therapeutic exercise;Therapeutic activities;Gait training;Balance training;Stair training;DME Instruction;Neuromuscular re-education;Patient/family education    PT Next Visit Plan  Continue POC    PT Home Exercise Plan  added seated dynamic core UE and LE lift offs with visual cues using mirror, supine marches    Consulted and Agree with Plan of Care  Patient       Patient will benefit from skilled therapeutic intervention in order to improve the following deficits and impairments:  Abnormal gait, Decreased balance, Decreased endurance, Difficulty walking, Decreased strength  Visit Diagnosis: Muscle weakness (generalized)  Other lack of coordination  Lack of coordination  Difficulty in walking, not elsewhere classified     Problem List There are no active problems to display for this patient.   [address], [place] DPT 10/26/2017, 11:35 AM  [place] MAIN [facility] SERVICES [place], [place], [address] Phone: [phone]   Fax:  [phone]  Name: JADAMARIE BUTSON MRN: [id number] Date of Birth: [birth year]

## 2017-10-28 ENCOUNTER — Encounter: Payer: Self-pay | Admitting: Occupational Therapy

## 2017-10-28 NOTE — Therapy (Signed)
[place] MAIN [facility] SERVICES [address] [place], [place], [address] Phone: [phone]   Fax:  [phone]  Occupational Therapy Treatment  Patient Details  Name: Michelle Randall MRN: [id number] Date of Birth: 04-May-1996 No Data Recorded  Encounter Date: 10/26/2017  OT End of Session - 10/28/17 1502    Visit Number  101    Number of Visits  118    Date for OT Re-Evaluation  11/25/17    Authorization Type  medicaid visit 71 of 118    OT Start Time  1015    OT Stop Time  1100    OT Time Calculation (min)  45 min    Activity Tolerance  Patient tolerated treatment well    Behavior During Therapy  [facility] for tasks assessed/performed       History reviewed. No pertinent past medical history.  History reviewed. No pertinent surgical history.  There were no vitals filed for this visit.  Subjective Assessment - 10/28/17 1457    Subjective   Patient reports she finished up with school, not sure yet if she will take a class in the winter semester.  If not she will have a couple months off.  Planning to do a cooking session next OT treatment.    Patient Stated Goals  To be as independent as possible at home and at school.     Currently in Pain?  No/denies    Pain Score  0-No pain                   OT Treatments/Exercises (OP) - 10/28/17 1459      Fine Motor Coordination   Other Fine Motor Exercises  Patient seen for manipulation of small 1/2 inch sized objects with right and left hands, translatory skills of the hand and using the hand for storage.       Neurological Re-education Exercises   Other Exercises 1  Patient seen for shape tower with 1# weights to each wrist moving items with right UE all 4 levels and back and then performing on the left UE.  Resistive pinch skills with green putty and pinching an pulling 1.2 inch objects from putty.  Resistive pinch pins with right and left side, all levels with cues for type of pinch  and technique.               OT Education - 10/28/17 1502    Education provided  Yes    Education Details  HEP    Person(s) Educated  Patient    Methods  Explanation;Demonstration    Comprehension  Verbalized understanding;Returned demonstration          OT Long Term Goals - 09/20/17 0929      OT LONG TERM GOAL #1   Title  Patient will demonstrate increased strength in right hand sufficient to scoop ice cream from frozen container with modified independence.    Baseline  goal update:  patient now able to complete with minimal assist at times.    Time  12    Period  Months    Status  On-going      OT LONG TERM GOAL #2   Title  Patient will be able to make her bed with modified independence.    Baseline  she continues to have difficulty with balance and completing this task, increased time, minimal assist at goal update, decreased time in the mornings to perform due to self care demands  Time  6    Period  Months    Status  On-going      OT LONG TERM GOAL #3   Title  Patient will demonstrate the ability to perform vacuuming of one room with modified independence using light weight vacuum.    Baseline  moderate assist at goal update.    Time  6    Period  Months    Status  Deferred      OT LONG TERM GOAL #4   Title  Patient will demonstrate the ability to open containers with sealed tops such as on yogurt containers and individual ice cream containers.     Baseline  goal update:  occasional assistance required but able to complete more than 50% of the time with modified independence.     Time  12    Period  Weeks    Status  Achieved      OT LONG TERM GOAL #5   Title  Patient will demonstrate the ability to perform small buttons on shirts independently and with good speed in order to dress for school.    Baseline  goal update: Patient can perform with increased time but occasionally needs assist if rushed.    Time  12    Period  Weeks    Status  Partially Met       OT LONG TERM GOAL #6   Title  Patient will donn and doff small earrings with modified independence.    Baseline  Goal update:  able to hold small backs and dropping only 20% of the time, slow to complete but improving.  Min assist     Time  12    Period  Weeks    Status  Partially Met      OT LONG TERM GOAL #7   Title  Patient will be able to zip her rainboots while they are on her feet with modified independence.      Baseline  patient unable to fit into current boots secondary to new AFOs    Time  12    Period  Weeks    Status  On-going      OT LONG TERM GOAL #8   Title  Patient to complete am self care demonstrating efficient methods of dressing skills to get to school on time.     Baseline  Goal update:  patient continues to work towards efficiency and was late 3 time in the last 2 weeks.    Time  12    Period  Weeks    Status  Partially Met      OT LONG TERM GOAL  #9   Baseline  Patient will don/doff socks and AFO braces with modified independence using adaptive equipment as needed.  (Baseline min assist)    Time  12    Period  Weeks    Status  New      OT LONG TERM GOAL  #10   TITLE  Patient will demonstrate the ability to pull jeans down over AFOs with modified independence.     Baseline   min assist    Time  12    Period  Weeks    Status  On-going      OT LONG TERM GOAL  #11   TITLE  Will be able to squeeze tooth paste out of the tube when it is less than half full.    Baseline  difficulty with small travel sized, may look into alternative ways  since this has been a chronic issue.    Time  6    Period  Months    Status  Achieved      OT LONG TERM GOAL  #12   TITLE  Patient will demonstrate managing wheelchair rain poncho in inclement weather with modified independence.    Baseline  crutch tips are difficult for anyone to take them off, goal deferred.    Time  6    Period  Months    Status  Achieved      OT LONG TERM GOAL  #13   TITLE  Patient will be  able to complete 2 1/2 minutes straight with left shoulder flexion simulating driving with a stearing wheel ball to be able to drive to college.     Baseline  Patient has not been driving, her family takes her to school and drops her off.     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     Baseline  difficulty coordinating the use of both hands to complete task.     Status  Partially Met      OT LONG TERM GOAL  #15   TITLE  Patient will complete meal preparation with minimal assist.     Baseline  max assist    Time  12    Period  Weeks    Status  New            Plan - 10/28/17 1502    Clinical Impression Statement  Patient intially missed her appt this week due to inclement weather and changes in her school schedule.  She was able to reschedule.  Planning and preparing for meal prep session next week, wants to make chocolate chip cookies.  Patient continues to work towards improving strength, grip and pinch in bilateral UEs as well as speed and dexterity for functional hand tasks.      Occupational Profile and client history currently impacting functional performance  decreased hand strength, balance and coordination.    Occupational performance deficits (Please refer to evaluation for details):  ADL's;IADL's;Leisure    Rehab Potential  Good    OT Frequency  1x / week    OT Duration  12 weeks    OT Treatment/Interventions  Self-care/ADL training;DME and/or AE instruction;Therapeutic activities;Therapeutic exercise;Neuromuscular education;Patient/family education    Plan  Occupational Therapy 1 x per week for ADL and strenghtening.    Consulted and Agree with Plan of Care  Patient       Patient will benefit from skilled therapeutic intervention in order to improve the following deficits and  impairments:  Impaired UE functional use, Difficulty walking, Decreased strength, Decreased coordination, Decreased endurance, Decreased balance  Visit Diagnosis: Muscle weakness (generalized)  Other lack of coordination    Problem List There are no active problems to display for this patient.  Achilles Dunk, OTR/L, CLT  Shamone Winzer 10/28/2017, 3:05 PM  [place] MAIN [facility] SERVICES [address] [place], [place], [address] Phone: [phone]   Fax:  [phone]  Name: Michelle Randall MRN: [id number] Date of Birth: 29-May-1996

## 2017-11-01 ENCOUNTER — Encounter: Payer: Self-pay | Admitting: Physical Therapy

## 2017-11-01 ENCOUNTER — Ambulatory Visit: Payer: Medicaid Other | Admitting: Physical Therapy

## 2017-11-01 ENCOUNTER — Encounter: Payer: Self-pay | Admitting: Occupational Therapy

## 2017-11-01 ENCOUNTER — Ambulatory Visit: Payer: Medicaid Other | Admitting: Occupational Therapy

## 2017-11-01 DIAGNOSIS — R279 Unspecified lack of coordination: Secondary | ICD-10-CM

## 2017-11-01 DIAGNOSIS — M6281 Muscle weakness (generalized): Secondary | ICD-10-CM

## 2017-11-01 DIAGNOSIS — R278 Other lack of coordination: Secondary | ICD-10-CM

## 2017-11-01 DIAGNOSIS — R262 Difficulty in walking, not elsewhere classified: Secondary | ICD-10-CM

## 2017-11-01 NOTE — Therapy (Signed)
[place] MAIN [facility] SERVICES [address] [place], [place], [address] Phone: [phone]   Fax:  [phone]  Physical Therapy Treatment  Patient Details  Name: Michelle Randall MRN: [id number] Date of Birth: 06/09/1996 Referring Provider: Langley Gauss MD   Encounter Date: 11/01/2017  PT End of Session - 11/01/17 1009    Visit Number  110    Number of Visits  134    Date for PT Re-Evaluation  01/03/18    Authorization Type  2/12    Authorization Time Period  12 visits 12/11-01/16/18     PT Start Time  0915    PT Stop Time  1000    PT Time Calculation (min)  45 min    Equipment Utilized During Treatment  Gait belt    Activity Tolerance  Patient tolerated treatment well    Behavior During Therapy  [facility] for tasks assessed/performed       History reviewed. No pertinent past medical history.  History reviewed. No pertinent surgical history.  There were no vitals filed for this visit.  Subjective Assessment - 11/01/17 1009    Subjective  Pt has no pain and reports no problems upon arrival. She reports compliance with HEP.    Patient is accompained by:  Family member    Limitations  Walking    Patient Stated Goals  Patient wants to improve her core strength.     Currently in Pain?  No/denies    Pain Score  0-No pain    Pain Onset  In the past 7 days       Treatment:  Bridges with assistance keeping feet on mat x 15  SLR x 15 B LE's  Supine hip ABD with ER x 20 - cues for form and to move through available ROM  1/2 kneeling with theraball side to side x 10   Tall kneeling with theraball side to side x 10  Seated dying bugs 2 x 15 each UE/LE - cues for form   Seated diagonal chops with red theraball x 15 ea. Way  LE marches sitting on swissball x 20 B LE's  Core twists with red theraball seated on swissball x 20 each way  Supine UE and LE lowers x 15 each - cues for proper form  Planks on elbows 4 x 30  sec with cues for proper alignment during hold                       PT Education - 11/01/17 1009    Education provided  Yes    Education Details  HEP    Person(s) Educated  Patient    Methods  Explanation;Demonstration    Comprehension  Verbalized understanding;Returned demonstration          PT Long Term Goals - 10/18/17 1146      PT LONG TERM GOAL #1   Title  Patient will improve Dynamic Gait Index (DGI) score to > 21/24 for meaningful improvement and low falls risk regarding dynamic walking tasks (revised from > 19/24 )    Baseline  14/24; 11/02/16: 15/24 01/28/17: 18/24 6/5: 19/24; 08/23/17 14/24,  14/24 10/11/17    Time  12    Period  Weeks    Status  On-going      PT LONG TERM GOAL #2   Title  Patient will increase Berg Balance score by >51/56 points to be considered a low risk for falls for improved safety. (  revised from >6 points improvement)    Baseline  11/02/16: 34/56 01/28/17: 39/56 6/5: 45/56; 08/23/2017 = 45/56, 43/56 10/11/17    Time  12    Period  Weeks    Status  On-going      PT LONG TERM GOAL #3   Title  Patient will be able to transfer in and out of a large car with  a high seat with CGA  to improve ability to go to school/doctor visits.     Baseline  Patient requires min A for transfer into large SUV unless it has a step rail on the side    Time  12    Period  Weeks    Status  On-going      PT LONG TERM GOAL #4   Title  Patient will complete a TUG test in < 12 seconds for independent mobility and decreased fall risk     Baseline  11.45; 08/23/2017 = 13.66 sec    Time  12    Period  Weeks    Status  On-going      PT LONG TERM GOAL #5   Title  Patient will improve 6 minute walk distance by > 150 ft for improved return to functional community activities     Baseline  740 01/28/2017: 751f 6/5: 795  730 on 07/19/17; 790 ft on 08/23/18,  740 feet    Time  12    Period  Weeks    Status  On-going      PT LONG TERM GOAL #6   Title  Patient  will improve gait speed to > 1.2 m/s with least restrictive assistive device to return to normal walking speed     Baseline  .76 m/h 01/28/2017: .87  07/19/17 0.687m; 0.77 m/s on 08/23/17, . 58 m/sec 10/11/17    Time  12    Period  Weeks    Status  On-going 01/03/18 target dat      PT LONG TERM GOAL #7   Title  Patient (< [age]ears old) will complete five times sit to stand test in < 10 seconds indicating an increased LE strength and improved balance     Baseline  9.45 sec; 9.63 on 08/23/17, 9.63 sec    Time  12    Period  Weeks    Status  Achieved      PT LONG TERM GOAL #8   Title  Patient will be able to ambulate on inclines and grass independenlty with LRAD    Baseline  Can ambulate over grassy inclines with decreased speed and safety requiring need loftstrand curtches and SBA    Time  12    Period  Weeks    Status  On-going 01/03/18 target date      PT LONG TERM GOAL  #9   TITLE  Patient will be abe to transfer from low chair or stool without UE support independently    Baseline  Patient needs min A assist to transfer from a low stool    Time  12    Period  Weeks    Status  Partially Met 10/11/17 target date      PT LONG TERM GOAL  #10   TITLE  Patient will be able to transfer from the floor to standing independently with use of LRAD    Baseline  Patient able to stand from the floor independently if she has not been on the floor longer than about 5 min due to stiffness  with prolonged positioning    Time  12    Period  Weeks    Status  On-going 01/03/18 target date      PT LONG TERM GOAL  #11   TITLE  Patient will step up onto a 6" step 5x with AD independently to increase community mobility    Baseline  Pt. has increased difficulty requiring supervision for safety, but no physical assist    Time  12    Period  Weeks    Status  On-going 01/03/18 target date            Plan - 11/01/17 1010    Clinical Impression Statement  Pt instructed in therapeutic exercise including core  strengthening exercise, and functional strengthening for sit<>stand and for step-ups on stairs/curbs. Pt remains limited with step ups with minor instability and max difficulty with a 5" step in the parallel bars. Pt may benefit from alternative step up method to increase community access. Pt will continue to benefit from skilled therapy in order to maximize functional mobility and safety    Rehab Potential  Good    Clinical Impairments Affecting Rehab Potential  weakness and decreased standing balance    PT Frequency  1x / week    PT Duration  12 weeks    PT Treatment/Interventions  Therapeutic exercise;Therapeutic activities;Gait training;Balance training;Stair training;DME Instruction;Neuromuscular re-education;Patient/family education    PT Next Visit Plan  Continue POC    PT Home Exercise Plan  added seated dynamic core UE and LE lift offs with visual cues using mirror, supine marches    Consulted and Agree with Plan of Care  Patient       Patient will benefit from skilled therapeutic intervention in order to improve the following deficits and impairments:  Abnormal gait, Decreased balance, Decreased endurance, Difficulty walking, Decreased strength  Visit Diagnosis: Muscle weakness (generalized)  Other lack of coordination  Lack of coordination  Difficulty in walking, not elsewhere classified     Problem List There are no active problems to display for this patient.   [address] [place] DPT 11/01/2017, 10:12 AM  [place] MAIN [facility] SERVICES [place], [place], [address] Phone: [phone]   Fax:  [phone]  Name: DELAYNE SANZO MRN: [id number] Date of Birth: 03-Jun-1996

## 2017-11-01 NOTE — Therapy (Signed)
[place] MAIN [facility] SERVICES [address] [place], [place], [address] Phone: [phone]   Fax:  [phone]  Occupational Therapy Treatment  Patient Details  Name: Michelle Randall MRN: [id number] Date of Birth: Jun 06, 1996 No Data Recorded  Encounter Date: 11/01/2017  OT End of Session - 11/01/17 1124    Visit Number  102    Number of Visits  118    Date for OT Re-Evaluation  11/25/17    Authorization Type  medicaid visit 16 of 118    OT Start Time  1000    OT Stop Time  1055    OT Time Calculation (min)  55 min    Activity Tolerance  Patient tolerated treatment well    Behavior During Therapy  [facility] for tasks assessed/performed       History reviewed. No pertinent past medical history.  History reviewed. No pertinent surgical history.  There were no vitals filed for this visit.  Subjective Assessment - 11/01/17 1122    Subjective   Pt. reports she is going out of thown next week for an [employer].    Patient is accompained by:  Family member    Currently in Pain?  No/denies       OT TREATMENT    Selfcare:  Pt. worked on a Customer service manager cookies. Pt. was able to follow a simple recipe. Pt. required increased time, and verbal cues for each step. Pt. had difficulty mixing the batter. Pt. requires cues for positioning, work simplification, and assist to complete. Pt. Was able to add the ingredients, and scoop the batter onto the cookie sheet.                        OT Education - 11/01/17 1123    Education provided  Yes    Education Details  baking, meal preparation    Person(s) Educated  Patient    Methods  Explanation;Demonstration    Comprehension  Verbalized understanding;Returned demonstration          OT Long Term Goals - 09/20/17 0929      OT LONG TERM GOAL #1   Title  Patient will demonstrate increased strength in right hand sufficient to scoop ice cream from frozen container  with modified independence.    Baseline  goal update:  patient now able to complete with minimal assist at times.    Time  12    Period  Months    Status  On-going      OT LONG TERM GOAL #2   Title  Patient will be able to make her bed with modified independence.    Baseline  she continues to have difficulty with balance and completing this task, increased time, minimal assist at goal update, decreased time in the mornings to perform due to self care demands    Time  6    Period  Months    Status  On-going      OT LONG TERM GOAL #3   Title  Patient will demonstrate the ability to perform vacuuming of one room with modified independence using light weight vacuum.    Baseline  moderate assist at goal update.    Time  6    Period  Months    Status  Deferred      OT LONG TERM GOAL #4   Title  Patient will demonstrate the ability to open containers with sealed tops such as on yogurt  containers and individual ice cream containers.     Baseline  goal update:  occasional assistance required but able to complete more than 50% of the time with modified independence.     Time  12    Period  Weeks    Status  Achieved      OT LONG TERM GOAL #5   Title  Patient will demonstrate the ability to perform small buttons on shirts independently and with good speed in order to dress for school.    Baseline  goal update: Patient can perform with increased time but occasionally needs assist if rushed.    Time  12    Period  Weeks    Status  Partially Met      OT LONG TERM GOAL #6   Title  Patient will donn and doff small earrings with modified independence.    Baseline  Goal update:  able to hold small backs and dropping only 20% of the time, slow to complete but improving.  Min assist     Time  12    Period  Weeks    Status  Partially Met      OT LONG TERM GOAL #7   Title  Patient will be able to zip her rainboots while they are on her feet with modified independence.      Baseline  patient  unable to fit into current boots secondary to new AFOs    Time  12    Period  Weeks    Status  On-going      OT LONG TERM GOAL #8   Title  Patient to complete am self care demonstrating efficient methods of dressing skills to get to school on time.     Baseline  Goal update:  patient continues to work towards efficiency and was late 3 time in the last 2 weeks.    Time  12    Period  Weeks    Status  Partially Met      OT LONG TERM GOAL  #9   Baseline  Patient will don/doff socks and AFO braces with modified independence using adaptive equipment as needed.  (Baseline min assist)    Time  12    Period  Weeks    Status  New      OT LONG TERM GOAL  #10   TITLE  Patient will demonstrate the ability to pull jeans down over AFOs with modified independence.     Baseline   min assist    Time  12    Period  Weeks    Status  On-going      OT LONG TERM GOAL  #11   TITLE  Will be able to squeeze tooth paste out of the tube when it is less than half full.    Baseline  difficulty with small travel sized, may look into alternative ways since this has been a chronic issue.    Time  6    Period  Months    Status  Achieved      OT LONG TERM GOAL  #12   TITLE  Patient will demonstrate managing wheelchair rain poncho in inclement weather with modified independence.    Baseline  crutch tips are difficult for anyone to take them off, goal deferred.    Time  6    Period  Months    Status  Achieved      OT LONG TERM GOAL  #13   TITLE  Patient  will be able to complete 2 1/2 minutes straight with left shoulder flexion simulating driving with a stearing wheel ball to be able to drive to college.     Baseline  Patient has not been driving, her family takes her to school and drops her off.     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     Baseline  difficulty coordinating the use of both hands to complete task.     Status  Partially Met      OT LONG TERM GOAL  #15   TITLE  Patient will complete meal preparation with minimal assist.     Baseline  max assist    Time  12    Period  Weeks    Status  New            Plan - 11/01/17 1204    Clinical Impression Statement  Pt. is planning to go to an [employer] throught he holidays. Pt. reports being excited about baking cookies. pt. reports she doesn't get a chance to cook at home, mostly she sets the table. Pt. has difficulty with stirring thick ccokie batter. Pt. required cues for positioning, modification, increased time, and restbreaks when mixing the batter. Pt. continues to work on improving UE strength, and coordination skills for improved IADL, IADL and meal preparation tasks.     Occupational performance deficits (Please refer to evaluation for details):  ADL's;IADL's;Leisure    Rehab Potential  Good    OT Frequency  1x / week    OT Duration  12 weeks    OT Treatment/Interventions  Self-care/ADL training;DME and/or AE instruction;Therapeutic activities;Therapeutic exercise;Neuromuscular education;Patient/family education    Plan  Occupational Therapy 1 x per week for ADL and strenghtening.    Consulted and Agree with Plan of Care  Patient       Patient will benefit from skilled therapeutic intervention in order to improve the following deficits and impairments:  Impaired UE functional use, Difficulty walking, Decreased strength, Decreased coordination, Decreased endurance, Decreased balance  Visit Diagnosis: Muscle weakness (generalized)  Other lack of coordination    Problem List There are no active problems to display for this patient.   Harrel Carina, MS, OTR/L 11/01/2017, 12:18 PM  [place] MAIN [facility] SERVICES [address] [place], [place], [address] Phone: [phone]    Fax:  [phone]  Name: Michelle Randall MRN: [id number] Date of Birth: 07-05-1996

## 2017-11-10 ENCOUNTER — Ambulatory Visit: Payer: Medicaid Other | Admitting: Occupational Therapy

## 2017-11-10 ENCOUNTER — Encounter: Payer: Self-pay | Admitting: Occupational Therapy

## 2017-11-10 ENCOUNTER — Ambulatory Visit: Payer: Medicaid Other | Admitting: Physical Therapy

## 2017-11-10 ENCOUNTER — Encounter: Payer: Self-pay | Admitting: Physical Therapy

## 2017-11-10 DIAGNOSIS — R278 Other lack of coordination: Secondary | ICD-10-CM

## 2017-11-10 DIAGNOSIS — M6281 Muscle weakness (generalized): Secondary | ICD-10-CM

## 2017-11-10 DIAGNOSIS — R262 Difficulty in walking, not elsewhere classified: Secondary | ICD-10-CM

## 2017-11-10 DIAGNOSIS — R279 Unspecified lack of coordination: Secondary | ICD-10-CM

## 2017-11-10 NOTE — Therapy (Signed)
[place] MAIN [facility] SERVICES [address] [place], [place], [address] Phone: [phone]   Fax:  [phone]  Physical Therapy Treatment  Patient Details  Name: Michelle Randall MRN: [id number] Date of Birth: April 16, 1996 Referring Provider: Langley Gauss MD   Encounter Date: 11/10/2017  PT End of Session - 11/10/17 0935    Visit Number  111    Number of Visits  134    Date for PT Re-Evaluation  01/03/18    Authorization Type  3/12    Authorization Time Period  12 visits 12/11-01/16/18     PT Start Time  0920    PT Stop Time  1000    PT Time Calculation (min)  40 min    Equipment Utilized During Treatment  Gait belt    Activity Tolerance  Patient tolerated treatment well;Patient limited by fatigue    Behavior During Therapy  [facility] for tasks assessed/performed       History reviewed. No pertinent past medical history.  History reviewed. No pertinent surgical history.  There were no vitals filed for this visit.    Treatments  Ascending/descending steps with over pressure to keep her left knee in neurtal and not go into IR x 4 steps x 3 sets              Seated marches x 30 B LE's               Supine marches with TA contraction x 20 B LE's               Bridges with TA contraction x 30 reps              Hooklying hip abd with yellow theraband x 20 reps              Seated UE/LE alternating raises with abdominal brace x 20 each - cues for proper technqiue; visual cues using mirror to keep arms level and trunk in line during exercises              Seated core twists with red theraball x 20 each direction              Walking on carpet from gym to tile hallway and back x 1 - supervision for safety, no rest breaks needed              Seated scap retraction with red theraband x 30 reps              Seated diagonals with red theraband x 30 reps each way              Serratus punch with red theraband with TA contraction  x 20 reps each                        PT Education - 11/10/17 0932    Education provided  Yes    Education Details  HEP    Person(s) Educated  Patient    Methods  Explanation;Demonstration    Comprehension  Verbalized understanding          PT Long Term Goals - 10/18/17 1146      PT LONG TERM GOAL #1   Title  Patient will improve Dynamic Gait Index (DGI) score to > 21/24 for meaningful improvement and low falls risk regarding dynamic walking tasks (revised from > 19/24 )    Baseline  14/24; 11/02/16: 15/24  01/28/17: 18/24 6/5: 19/24; 08/23/17 14/24,  14/24 10/11/17    Time  12    Period  Weeks    Status  On-going      PT LONG TERM GOAL #2   Title  Patient will increase Berg Balance score by >51/56 points to be considered a low risk for falls for improved safety. (revised from >6 points improvement)    Baseline  11/02/16: 34/56 01/28/17: 39/56 6/5: 45/56; 08/23/2017 = 45/56, 43/56 10/11/17    Time  12    Period  Weeks    Status  On-going      PT LONG TERM GOAL #3   Title  Patient will be able to transfer in and out of a large car with  a high seat with CGA  to improve ability to go to school/doctor visits.     Baseline  Patient requires min A for transfer into large SUV unless it has a step rail on the side    Time  12    Period  Weeks    Status  On-going      PT LONG TERM GOAL #4   Title  Patient will complete a TUG test in < 12 seconds for independent mobility and decreased fall risk     Baseline  11.45; 08/23/2017 = 13.66 sec    Time  12    Period  Weeks    Status  On-going      PT LONG TERM GOAL #5   Title  Patient will improve 6 minute walk distance by > 150 ft for improved return to functional community activities     Baseline  740 01/28/2017: 75f 6/5: 795  730 on 07/19/17; 790 ft on 08/23/18,  740 feet    Time  12    Period  Weeks    Status  On-going      PT LONG TERM GOAL #6   Title  Patient will improve gait speed to > 1.2 m/s with least  restrictive assistive device to return to normal walking speed     Baseline  .76 m/h 01/28/2017: .87  07/19/17 0.662m; 0.77 m/s on 08/23/17, . 58 m/sec 10/11/17    Time  12    Period  Weeks    Status  On-going 01/03/18 target dat      PT LONG TERM GOAL #7   Title  Patient (< 6039ears old) will complete five times sit to stand test in < 10 seconds indicating an increased LE strength and improved balance     Baseline  9.45 sec; 9.63 on 08/23/17, 9.63 sec    Time  12    Period  Weeks    Status  Achieved      PT LONG TERM GOAL #8   Title  Patient will be able to ambulate on inclines and grass independenlty with LRAD    Baseline  Can ambulate over grassy inclines with decreased speed and safety requiring need loftstrand curtches and SBA    Time  12    Period  Weeks    Status  On-going 01/03/18 target date      PT LONG TERM GOAL  #9   TITLE  Patient will be abe to transfer from low chair or stool without UE support independently    Baseline  Patient needs min A assist to transfer from a low stool    Time  12    Period  Weeks    Status  Partially Met 10/11/17 target  date      PT LONG TERM GOAL  #10   TITLE  Patient will be able to transfer from the floor to standing independently with use of LRAD    Baseline  Patient able to stand from the floor independently if she has not been on the floor longer than about 5 min due to stiffness with prolonged positioning    Time  12    Period  Weeks    Status  On-going 01/03/18 target date      PT LONG TERM GOAL  #11   TITLE  Patient will step up onto a 6" step 5x with AD independently to increase community mobility    Baseline  Pt. has increased difficulty requiring supervision for safety, but no physical assist    Time  12    Period  Weeks    Status  On-going 01/03/18 target date            Plan - 11/10/17 0937    Clinical Impression Statement  Pt instructed in therapeutic exercise including core strengthening exercise, and functional  strengthening for sit<>stand and for step-ups on stairs/curbs. Patient has severe varus of knees during step up and step down and might benefit from a long leg brace to keep knees in neutral during steps.. Pt will continue to benefit from skilled therapy in order to maximize functional mobility and safety.    Rehab Potential  Good    Clinical Impairments Affecting Rehab Potential  weakness and decreased standing balance    PT Frequency  1x / week    PT Duration  12 weeks    PT Treatment/Interventions  Therapeutic exercise;Therapeutic activities;Gait training;Balance training;Stair training;DME Instruction;Neuromuscular re-education;Patient/family education    PT Next Visit Plan  Continue POC    PT Home Exercise Plan  added seated dynamic core UE and LE lift offs with visual cues using mirror, supine marches    Consulted and Agree with Plan of Care  Patient       Patient will benefit from skilled therapeutic intervention in order to improve the following deficits and impairments:  Abnormal gait, Decreased balance, Decreased endurance, Difficulty walking, Decreased strength  Visit Diagnosis: Muscle weakness (generalized)  Other lack of coordination  Lack of coordination  Difficulty in walking, not elsewhere classified     Problem List There are no active problems to display for this patient.   [address], [place] DPT 11/10/2017, 9:40 AM  [place] MAIN [facility] SERVICES [place], [place], [address] Phone: [phone]   Fax:  [phone]  Name: TAMIKI KUBA MRN: [id number] Date of Birth: 1996/08/05

## 2017-11-14 NOTE — Therapy (Signed)
[place] MAIN [facility] SERVICES [address] [place], [place], [address] Phone: [phone]   Fax:  [phone]  Occupational Therapy Treatment  Patient Details  Name: Michelle Randall MRN: [id number] Date of Birth: 1996/08/23 No Data Recorded  Encounter Date: 11/10/2017  OT End of Session - 11/14/17 1603    Visit Number  103    Number of Visits  118    Date for OT Re-Evaluation  11/25/17    Authorization Type  medicaid visit 64 of 40    OT Start Time  1000    OT Stop Time  1045    OT Time Calculation (min)  45 min    Activity Tolerance  Patient tolerated treatment well    Behavior During Therapy  [facility] for tasks assessed/performed       History reviewed. No pertinent past medical history.  History reviewed. No pertinent surgical history.  There were no vitals filed for this visit.  Subjective Assessment - 11/14/17 1602    Subjective   Patient reports she missed the bus to go to [facility], she got the dates wrong.  Her friends ended up doing a go fund me page and raised money for her to get a flight to [place].  She will be traveling alone and will have a short layover in [place].  Discussed asking staff for help if needed at any point during the trip.  She will travel back on the bus with the rest of the group.     Patient Stated Goals  To be as independent as possible at home and at school.     Currently in Pain?  No/denies    Pain Score  0-No pain                   OT Treatments/Exercises (OP) - 11/14/17 1604      ADLs   ADL Comments  Patient seen for resistive putty for cutting and scooping with left and right  hands  for multiple trials and repetitions, cues for technique.        Fine Motor Coordination   Other Fine Motor Exercises  Patient also seen for manipulation of small pieces from [place] with washers, collars and dowel with right hand to assemble and disassemble components and picking up items from magnetic  cup for more finger resistance.       Neurological Re-education Exercises   Other Exercises 1  2# hand weight for overhead press, chest press, lateral raises, elbow flexion, supination/pronation for 15 repetitions for 3 sets each with cues for technique and form performed with right and left UEs.  Reaching tasks in multiple directions with both hands with cues for weight shifting to obtain items out of base of support.                  OT Long Term Goals - 09/20/17 0929      OT LONG TERM GOAL #1   Title  Patient will demonstrate increased strength in right hand sufficient to scoop ice cream from frozen container with modified independence.    Baseline  goal update:  patient now able to complete with minimal assist at times.    Time  12    Period  Months    Status  On-going      OT LONG TERM GOAL #2   Title  Patient will be able to make her bed with modified independence.    Baseline  she  continues to have difficulty with balance and completing this task, increased time, minimal assist at goal update, decreased time in the mornings to perform due to self care demands    Time  6    Period  Months    Status  On-going      OT LONG TERM GOAL #3   Title  Patient will demonstrate the ability to perform vacuuming of one room with modified independence using light weight vacuum.    Baseline  moderate assist at goal update.    Time  6    Period  Months    Status  Deferred      OT LONG TERM GOAL #4   Title  Patient will demonstrate the ability to open containers with sealed tops such as on yogurt containers and individual ice cream containers.     Baseline  goal update:  occasional assistance required but able to complete more than 50% of the time with modified independence.     Time  12    Period  Weeks    Status  Achieved      OT LONG TERM GOAL #5   Title  Patient will demonstrate the ability to perform small buttons on shirts independently and with good speed in order to  dress for school.    Baseline  goal update: Patient can perform with increased time but occasionally needs assist if rushed.    Time  12    Period  Weeks    Status  Partially Met      OT LONG TERM GOAL #6   Title  Patient will donn and doff small earrings with modified independence.    Baseline  Goal update:  able to hold small backs and dropping only 20% of the time, slow to complete but improving.  Min assist     Time  12    Period  Weeks    Status  Partially Met      OT LONG TERM GOAL #7   Title  Patient will be able to zip her rainboots while they are on her feet with modified independence.      Baseline  patient unable to fit into current boots secondary to new AFOs    Time  12    Period  Weeks    Status  On-going      OT LONG TERM GOAL #8   Title  Patient to complete am self care demonstrating efficient methods of dressing skills to get to school on time.     Baseline  Goal update:  patient continues to work towards efficiency and was late 3 time in the last 2 weeks.    Time  12    Period  Weeks    Status  Partially Met      OT LONG TERM GOAL  #9   Baseline  Patient will don/doff socks and AFO braces with modified independence using adaptive equipment as needed.  (Baseline min assist)    Time  12    Period  Weeks    Status  New      OT LONG TERM GOAL  #10   TITLE  Patient will demonstrate the ability to pull jeans down over AFOs with modified independence.     Baseline   min assist    Time  12    Period  Weeks    Status  On-going      OT LONG TERM GOAL  #11   TITLE  Will  be able to squeeze tooth paste out of the tube when it is less than half full.    Baseline  difficulty with small travel sized, may look into alternative ways since this has been a chronic issue.    Time  6    Period  Months    Status  Achieved      OT LONG TERM GOAL  #12   TITLE  Patient will demonstrate managing wheelchair rain poncho in inclement weather with modified independence.     Baseline  crutch tips are difficult for anyone to take them off, goal deferred.    Time  6    Period  Months    Status  Achieved      OT LONG TERM GOAL  #13   TITLE  Patient will be able to complete 2 1/2 minutes straight with left shoulder flexion simulating driving with a stearing wheel ball to be able to drive to college.     Baseline  Patient has not been driving, her family takes her to school and drops her off.     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     Baseline  difficulty coordinating the use of both hands to complete task.     Status  Partially Met      OT LONG TERM GOAL  #15   TITLE  Patient will complete meal preparation with minimal assist.     Baseline  max assist    Time  12    Period  Weeks    Status  New            Plan - 11/14/17 1603    Clinical Impression Statement  Patient was disappointed she mixed up the schedule and did not realize she did not have the dates right for her upcoming trip.  She missed the bus but her friends were able to raise money to get her a flight to join them at the conference.  She will be traveling alone, we discussed requesting assistance especially during the layover and for getting to the bathroom if needed.  She is excited about going later today.  She continues to progress with tasks for strength and coordination skills. Will plan to reassess goals and progress in the next couple weeks.     Occupational Profile and client history currently impacting functional performance  decreased hand strength, balance and coordination.    Occupational performance deficits (Please refer to evaluation for details):  ADL's;IADL's;Leisure    Rehab Potential  Good    OT Frequency  1x / week    OT Duration  12 weeks    OT Treatment/Interventions  Self-care/ADL  training;DME and/or AE instruction;Therapeutic activities;Therapeutic exercise;Neuromuscular education;Patient/family education    Consulted and Agree with Plan of Care  Patient       Patient will benefit from skilled therapeutic intervention in order to improve the following deficits and impairments:  Impaired UE functional use, Difficulty walking, Decreased strength, Decreased coordination, Decreased endurance, Decreased balance  Visit Diagnosis: Muscle weakness (generalized)  Other lack of coordination    Problem List There are no active problems to display for this patient.  Amy T Lovett, OTR/L, CLT  Lovett,Amy 11/14/2017, 4:11  PM  [place] MAIN [facility] SERVICES [address] [place], [place], [address] Phone: [phone]   Fax:  [phone]  Name: Michelle Randall MRN: [id number] Date of Birth: [birth year]

## 2017-11-17 ENCOUNTER — Encounter: Payer: Self-pay | Admitting: Occupational Therapy

## 2017-11-17 ENCOUNTER — Ambulatory Visit: Payer: Medicaid Other

## 2017-11-17 ENCOUNTER — Ambulatory Visit: Payer: Medicaid Other | Attending: Student | Admitting: Occupational Therapy

## 2017-11-17 DIAGNOSIS — R279 Unspecified lack of coordination: Secondary | ICD-10-CM | POA: Diagnosis present

## 2017-11-17 DIAGNOSIS — R262 Difficulty in walking, not elsewhere classified: Secondary | ICD-10-CM | POA: Diagnosis present

## 2017-11-17 DIAGNOSIS — R278 Other lack of coordination: Secondary | ICD-10-CM | POA: Diagnosis present

## 2017-11-17 DIAGNOSIS — M6281 Muscle weakness (generalized): Secondary | ICD-10-CM

## 2017-11-17 NOTE — Therapy (Signed)
[place] MAIN [facility] SERVICES [address] [place], [place], [address] Phone: [phone]   Fax:  [phone]  Physical Therapy Treatment  Patient Details  Name: Michelle Randall MRN: [id number] Date of Birth: May 10, 1996 Referring Provider: Langley Gauss MD   Encounter Date: 11/17/2017  PT End of Session - 11/17/17 1216    Visit Number  112    Number of Visits  134    Date for PT Re-Evaluation  01/03/18    Authorization Type  4/12    Authorization Time Period  12 visits 12/11-01/16/18     PT Start Time  1116    PT Stop Time  1200    PT Time Calculation (min)  44 min    Equipment Utilized During Treatment  Gait belt    Activity Tolerance  Patient tolerated treatment well;Patient limited by fatigue    Behavior During Therapy  [facility] for tasks assessed/performed       History reviewed. No pertinent past medical history.  History reviewed. No pertinent surgical history.  There were no vitals filed for this visit.  Subjective Assessment - 11/17/17 1215    Subjective  Patient returned from her trip to [place]. She reports feeling very stiff from the long bus trip and drive.     Patient is accompained by:  Family member    Limitations  Walking    Patient Stated Goals  Patient wants to improve her core strength.     Currently in Pain?  No/denies        5lb bar overhead hold with SLR 10x each leg supine position   5lb bar overhead raise hold with bilateral knee bend and straighten (reverse crunches)   2lb dumbbell same side arm and leg meet 7x, opp arm and leg meet 7x   Prone hamstring curls with mod A to R min A to L 10x    Bridges with assistance keeping feet on mat x 15   Supine hamstring curls with swiss ball 15x   Seated straight leg raise with adduction and ball squeeze between ankles 10x   Seated hip abduction with PT blocking to prevent knee extension 12x each leg   Resisted knee/hip flexion GTB 12x   Prone hip  flexor Straight leg 60 seconds stretch, prone hip flexor/ quad stretch bent knee 60 seconds Adduction stretch prone   Prone hamstring curls with mod A to R min A to L 10x     Pt. response to medical necessity:  Patient will continue to benefit from skilled physical therapy in order to maximize functional mobility and safety.                        PT Education - 11/17/17 1216    Education provided  Yes    Education Details  stretching and compensatory mechanicsm     Person(s) Educated  Patient    Methods  Explanation;Demonstration;Verbal cues    Comprehension  Verbalized understanding;Returned demonstration          PT Long Term Goals - 10/18/17 1146      PT LONG TERM GOAL #1   Title  Patient will improve Dynamic Gait Index (DGI) score to > 21/24 for meaningful improvement and low falls risk regarding dynamic walking tasks (revised from > 19/24 )    Baseline  14/24; 11/02/16: 15/24 01/28/17: 18/24 6/5: 19/24; 08/23/17 14/24,  14/24 10/11/17    Time  12  Period  Weeks    Status  On-going      PT LONG TERM GOAL #2   Title  Patient will increase Berg Balance score by >51/56 points to be considered a low risk for falls for improved safety. (revised from >6 points improvement)    Baseline  11/02/16: 34/56 01/28/17: 39/56 6/5: 45/56; 08/23/2017 = 45/56, 43/56 10/11/17    Time  12    Period  Weeks    Status  On-going      PT LONG TERM GOAL #3   Title  Patient will be able to transfer in and out of a large car with  a high seat with CGA  to improve ability to go to school/doctor visits.     Baseline  Patient requires min A for transfer into large SUV unless it has a step rail on the side    Time  12    Period  Weeks    Status  On-going      PT LONG TERM GOAL #4   Title  Patient will complete a TUG test in < 12 seconds for independent mobility and decreased fall risk     Baseline  11.45; 08/23/2017 = 13.66 sec    Time  12    Period  Weeks    Status  On-going       PT LONG TERM GOAL #5   Title  Patient will improve 6 minute walk distance by > 150 ft for improved return to functional community activities     Baseline  740 01/28/2017: 786f 6/5: 795  730 on 07/19/17; 790 ft on 08/23/18,  740 feet    Time  12    Period  Weeks    Status  On-going      PT LONG TERM GOAL #6   Title  Patient will improve gait speed to > 1.2 m/s with least restrictive assistive device to return to normal walking speed     Baseline  .76 m/h 01/28/2017: .87  07/19/17 0.63m; 0.77 m/s on 08/23/17, . 58 m/sec 10/11/17    Time  12    Period  Weeks    Status  On-going 01/03/18 target dat      PT LONG TERM GOAL #7   Title  Patient (< 6024ears old) will complete five times sit to stand test in < 10 seconds indicating an increased LE strength and improved balance     Baseline  9.45 sec; 9.63 on 08/23/17, 9.63 sec    Time  12    Period  Weeks    Status  Achieved      PT LONG TERM GOAL #8   Title  Patient will be able to ambulate on inclines and grass independenlty with LRAD    Baseline  Can ambulate over grassy inclines with decreased speed and safety requiring need loftstrand curtches and SBA    Time  12    Period  Weeks    Status  On-going 01/03/18 target date      PT LONG TERM GOAL  #9   TITLE  Patient will be abe to transfer from low chair or stool without UE support independently    Baseline  Patient needs min A assist to transfer from a low stool    Time  12    Period  Weeks    Status  Partially Met 10/11/17 target date      PT LONG TERM GOAL  #10   TITLE  Patient will  be able to transfer from the floor to standing independently with use of LRAD    Baseline  Patient able to stand from the floor independently if she has not been on the floor longer than about 5 min due to stiffness with prolonged positioning    Time  12    Period  Weeks    Status  On-going 01/03/18 target date      PT LONG TERM GOAL  #11   TITLE  Patient will step up onto a 6" step 5x with AD  independently to increase community mobility    Baseline  Pt. has increased difficulty requiring supervision for safety, but no physical assist    Time  12    Period  Weeks    Status  On-going 01/03/18 target date            Plan - 11/17/17 1217    Clinical Impression Statement  Patient presents with LLE stronger than RLE. Noted compensatory pattern of L gluteal musculature with pelvic alignment of bridge and hamstring curls. Focus on decreased flexion compensatory mechanism to allow for increased strengthening of hip musculature.  Patient will continue to benefit from skilled physical therapy in order to maximize functional mobility and safety.     Rehab Potential  Good    Clinical Impairments Affecting Rehab Potential  weakness and decreased standing balance    PT Frequency  1x / week    PT Duration  12 weeks    PT Treatment/Interventions  Therapeutic exercise;Therapeutic activities;Gait training;Balance training;Stair training;DME Instruction;Neuromuscular re-education;Patient/family education    PT Next Visit Plan  Continue POC    PT Home Exercise Plan  added seated dynamic core UE and LE lift offs with visual cues using mirror, supine marches    Consulted and Agree with Plan of Care  Patient       Patient will benefit from skilled therapeutic intervention in order to improve the following deficits and impairments:  Abnormal gait, Decreased balance, Decreased endurance, Difficulty walking, Decreased strength  Visit Diagnosis: Muscle weakness (generalized)  Other lack of coordination  Lack of coordination  Difficulty in walking, not elsewhere classified     Problem List There are no active problems to display for this patient. Janna Arch, PT, DPT   Janna Arch 11/17/2017, 12:19 PM  [place] MAIN [facility] SERVICES [address] [place], [place], [address] Phone: [phone]   Fax:  [phone]  Name: XINYI BATTON MRN:  [id number] Date of Birth: 02-Jan-1996

## 2017-11-19 NOTE — Therapy (Signed)
[place] MAIN [facility] SERVICES [address] [place], [place], [address] Phone: [phone]   Fax:  [phone]  Occupational Therapy Treatment  Patient Details  Name: Michelle Randall MRN: [id number] Date of Birth: 11-23-1995 No Data Recorded  Encounter Date: 11/17/2017  OT End of Session - 11/19/17 1858    Visit Number  104    Number of Visits  118    Date for OT Re-Evaluation  11/25/17    Authorization Type  medicaid visit 41 of 118    OT Start Time  1030    OT Stop Time  1115    OT Time Calculation (min)  45 min    Activity Tolerance  Patient tolerated treatment well    Behavior During Therapy  [facility] for tasks assessed/performed       History reviewed. No pertinent past medical history.  History reviewed. No pertinent surgical history.  There were no vitals filed for this visit.  Subjective Assessment - 11/19/17 1857    Subjective   Patient reports she went on her trip for the interfaith conference last week.  She had originally missed the bus but they were able to raise money to get her on a flight.  She was able to get a direct flight.     Patient Stated Goals  To be as independent as possible at home and at school.     Currently in Pain?  No/denies    Pain Score  0-No pain          Patient seen for green resistive putty for grip strengthening in each hand, lateral pinch, 3 point and 2 point pinch with cues for prehension patterns and technique.   Patient seen for strengthening exercises with 3# dowel for shoulder flexion, chest press, ABD/ADD, forwards and backwards circles, elbow flexion/extension with cues for form and technique.  Completed 2 sets of 12 repetitions each  Patient seen for manipulation of [place] discs with left hand with cues for turning and flipping prehension patterns, performed for 2 complete sets and then worked towards bilateral hand flipping with emphasis on timing and speed to produce simultaneous  results.                   OT Education - 11/19/17 1858    Education provided  Yes    Education Details  HEP    Person(s) Educated  Patient    Methods  Explanation;Demonstration;Verbal cues    Comprehension  Verbal cues required;Returned demonstration;Verbalized understanding          OT Long Term Goals - 09/20/17 0929      OT LONG TERM GOAL #1   Title  Patient will demonstrate increased strength in right hand sufficient to scoop ice cream from frozen container with modified independence.    Baseline  goal update:  patient now able to complete with minimal assist at times.    Time  12    Period  Months    Status  On-going      OT LONG TERM GOAL #2   Title  Patient will be able to make her bed with modified independence.    Baseline  she continues to have difficulty with balance and completing this task, increased time, minimal assist at goal update, decreased time in the mornings to perform due to self care demands    Time  6    Period  Months    Status  On-going  OT LONG TERM GOAL #3   Title  Patient will demonstrate the ability to perform vacuuming of one room with modified independence using light weight vacuum.    Baseline  moderate assist at goal update.    Time  6    Period  Months    Status  Deferred      OT LONG TERM GOAL #4   Title  Patient will demonstrate the ability to open containers with sealed tops such as on yogurt containers and individual ice cream containers.     Baseline  goal update:  occasional assistance required but able to complete more than 50% of the time with modified independence.     Time  12    Period  Weeks    Status  Achieved      OT LONG TERM GOAL #5   Title  Patient will demonstrate the ability to perform small buttons on shirts independently and with good speed in order to dress for school.    Baseline  goal update: Patient can perform with increased time but occasionally needs assist if rushed.    Time  12     Period  Weeks    Status  Partially Met      OT LONG TERM GOAL #6   Title  Patient will donn and doff small earrings with modified independence.    Baseline  Goal update:  able to hold small backs and dropping only 20% of the time, slow to complete but improving.  Min assist     Time  12    Period  Weeks    Status  Partially Met      OT LONG TERM GOAL #7   Title  Patient will be able to zip her rainboots while they are on her feet with modified independence.      Baseline  patient unable to fit into current boots secondary to new AFOs    Time  12    Period  Weeks    Status  On-going      OT LONG TERM GOAL #8   Title  Patient to complete am self care demonstrating efficient methods of dressing skills to get to school on time.     Baseline  Goal update:  patient continues to work towards efficiency and was late 3 time in the last 2 weeks.    Time  12    Period  Weeks    Status  Partially Met      OT LONG TERM GOAL  #9   Baseline  Patient will don/doff socks and AFO braces with modified independence using adaptive equipment as needed.  (Baseline min assist)    Time  12    Period  Weeks    Status  New      OT LONG TERM GOAL  #10   TITLE  Patient will demonstrate the ability to pull jeans down over AFOs with modified independence.     Baseline   min assist    Time  12    Period  Weeks    Status  On-going      OT LONG TERM GOAL  #11   TITLE  Will be able to squeeze tooth paste out of the tube when it is less than half full.    Baseline  difficulty with small travel sized, may look into alternative ways since this has been a chronic issue.    Time  6    Period  Months  Status  Achieved      OT LONG TERM GOAL  #12   TITLE  Patient will demonstrate managing wheelchair rain poncho in inclement weather with modified independence.    Baseline  crutch tips are difficult for anyone to take them off, goal deferred.    Time  6    Period  Months    Status  Achieved      OT LONG  TERM GOAL  #13   TITLE  Patient will be able to complete 2 1/2 minutes straight with left shoulder flexion simulating driving with a stearing wheel ball to be able to drive to college.     Baseline  Patient has not been driving, her family takes her to school and drops her off.     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     Baseline  difficulty coordinating the use of both hands to complete task.     Status  Partially Met      OT LONG TERM GOAL  #15   TITLE  Patient will complete meal preparation with minimal assist.     Baseline  max assist    Time  12    Period  Weeks    Status  New            Plan - 11/19/17 1859    Clinical Impression Statement  Patient continues to progress with bilateral UE strength, ROM and functional use for daily tasks.  She reported increased difficulty with task of stirring resistive batter for making cookies the other week and needs to work more on arm strength for pushing, pulling and performing circular motions for stirring with baking and cooking.  Will plan to reassess in the next couple of sessions to determine further plan of care and goals.     Occupational Profile and client history currently impacting functional performance  decreased hand strength, balance and coordination.    Occupational performance deficits (Please refer to evaluation for details):  ADL's;IADL's;Leisure    Rehab Potential  Good    OT Frequency  1x / week    OT Duration  12 weeks    OT Treatment/Interventions  Self-care/ADL training;DME and/or AE instruction;Therapeutic activities;Therapeutic exercise;Neuromuscular education;Patient/family education    Consulted and Agree with Plan of Care  Patient       Patient will benefit from skilled therapeutic intervention in order to improve the  following deficits and impairments:  Impaired UE functional use, Difficulty walking, Decreased strength, Decreased coordination, Decreased endurance, Decreased balance  Visit Diagnosis: Muscle weakness (generalized)  Other lack of coordination    Problem List There are no active problems to display for this patient.  Achilles Dunk, OTR/L, CLT  Carlis Blanchard 11/19/2017, 7:00 PM  [place] MAIN [facility] SERVICES [place], [place], [address] Phone: [phone]   Fax:  [phone]  Name: Michelle Randall MRN: [id number] Date of Birth: 11-20-1995

## 2017-11-23 ENCOUNTER — Ambulatory Visit: Payer: Medicaid Other | Admitting: Occupational Therapy

## 2017-11-23 ENCOUNTER — Encounter: Payer: Self-pay | Admitting: Occupational Therapy

## 2017-11-23 ENCOUNTER — Ambulatory Visit: Payer: Medicaid Other

## 2017-11-23 DIAGNOSIS — R278 Other lack of coordination: Secondary | ICD-10-CM

## 2017-11-23 DIAGNOSIS — M6281 Muscle weakness (generalized): Secondary | ICD-10-CM | POA: Diagnosis not present

## 2017-11-23 DIAGNOSIS — R262 Difficulty in walking, not elsewhere classified: Secondary | ICD-10-CM

## 2017-11-23 NOTE — Therapy (Signed)
[place] MAIN [facility] SERVICES [address] [place], [place], [address] Phone: [phone]   Fax:  [phone]  Physical Therapy Treatment  Patient Details  Name: Michelle Randall MRN: [id number] Date of Birth: [birth year] Referring Provider: Langley Gauss MD   Encounter Date: 11/23/2017  PT End of Session - 11/23/17 1104    Visit Number  113    Number of Visits  134    Date for PT Re-Evaluation  01/03/18    Authorization Type  5/12    Authorization Time Period  12 visits 12/11-01/16/18     PT Start Time  1015    PT Stop Time  1101    PT Time Calculation (min)  46 min    Equipment Utilized During Treatment  Gait belt    Activity Tolerance  Patient tolerated treatment well;Patient limited by fatigue    Behavior During Therapy  [facility] for tasks assessed/performed       History reviewed. No pertinent past medical history.  History reviewed. No pertinent surgical history.  There were no vitals filed for this visit.  Subjective Assessment - 11/23/17 1017    Subjective  Patient is doing internship for J term shadowing PA.Feeling less stiff from bus trip at this time.  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     Patient is accompained by:  Family member    Limitations  Walking    Patient Stated Goals  Patient wants to improve her core strength.     Currently in Pain?  No/denies       5lb bar overhead hold with SLR 10x each leg supine position, performed 2x     5lb bar overhead raise hold with bilateral knee bend and straighten (reverse crunches) , 2x      Prone hamstring curls with mod A to R min A to L 10x     Bridges with assistance keeping feet on mat x 15   Supine hamstring curls with swiss ball 15x    Seated straight leg raise with adduction and ball squeeze between ankles 10x      Seated thoracic rotation for abdominal  Strengthening (diagonal forward and diagonal lateral and back) 15x each direction   5lb dumbbell  overhead raises seated for postural strengthening.    Prone hip flexor Straight leg 60 seconds stretch, prone hip flexor/ quad stretch bent knee 60 seconds Adduction stretch prone  Hip IR and ER stretch prone   Prone hamstring curls with mod A to R min A to L 10x      Resisted knee flexion seated EOB 15x each leg,    Pt. response to medical necessity:   Patient will continue to benefit from skilled physical therapy in order to maximize functional mobility and safety.                         PT Education - 11/23/17 1104    Education provided  Yes    Education Details  strengthening and core stability     Person(s) Educated  Patient    Methods  Explanation;Demonstration;Verbal cues    Comprehension  Verbalized understanding;Returned demonstration          PT Long Term Goals - 10/18/17 1146      PT LONG TERM GOAL #1   Title  Patient will improve Dynamic Gait Index (DGI) score to > 21/24 for meaningful improvement and low falls risk regarding dynamic walking tasks (  revised from > 19/24 )    Baseline  14/24; 11/02/16: 15/24 01/28/17: 18/24 6/5: 19/24; 08/23/17 14/24,  14/24 10/11/17    Time  12    Period  Weeks    Status  On-going      PT LONG TERM GOAL #2   Title  Patient will increase Berg Balance score by >51/56 points to be considered a low risk for falls for improved safety. (revised from >6 points improvement)    Baseline  11/02/16: 34/56 01/28/17: 39/56 6/5: 45/56; 08/23/2017 = 45/56, 43/56 10/11/17    Time  12    Period  Weeks    Status  On-going      PT LONG TERM GOAL #3   Title  Patient will be able to transfer in and out of a large car with  a high seat with CGA  to improve ability to go to school/doctor visits.     Baseline  Patient requires min A for transfer into large SUV unless it has a step rail on the side    Time  12    Period  Weeks    Status  On-going      PT LONG TERM GOAL #4   Title  Patient will complete a TUG test in < 12  seconds for independent mobility and decreased fall risk     Baseline  11.45; 08/23/2017 = 13.66 sec    Time  12    Period  Weeks    Status  On-going      PT LONG TERM GOAL #5   Title  Patient will improve 6 minute walk distance by > 150 ft for improved return to functional community activities     Baseline  740 01/28/2017: 712f 6/5: 795  730 on 07/19/17; 790 ft on 08/23/18,  740 feet    Time  12    Period  Weeks    Status  On-going      PT LONG TERM GOAL #6   Title  Patient will improve gait speed to > 1.2 m/s with least restrictive assistive device to return to normal walking speed     Baseline  .76 m/h 01/28/2017: .87  07/19/17 0.644m; 0.77 m/s on 08/23/17, . 58 m/sec 10/11/17    Time  12    Period  Weeks    Status  On-going 01/03/18 target dat      PT LONG TERM GOAL #7   Title  Patient (< [age]ears old) will complete five times sit to stand test in < 10 seconds indicating an increased LE strength and improved balance     Baseline  9.45 sec; 9.63 on 08/23/17, 9.63 sec    Time  12    Period  Weeks    Status  Achieved      PT LONG TERM GOAL #8   Title  Patient will be able to ambulate on inclines and grass independenlty with LRAD    Baseline  Can ambulate over grassy inclines with decreased speed and safety requiring need loftstrand curtches and SBA    Time  12    Period  Weeks    Status  On-going 01/03/18 target date      PT LONG TERM GOAL  #9   TITLE  Patient will be abe to transfer from low chair or stool without UE support independently    Baseline  Patient needs min A assist to transfer from a low stool    Time  12  Period  Weeks    Status  Partially Met 10/11/17 target date      PT LONG TERM GOAL  #10   TITLE  Patient will be able to transfer from the floor to standing independently with use of LRAD    Baseline  Patient able to stand from the floor independently if she has not been on the floor longer than about 5 min due to stiffness with prolonged positioning    Time  12     Period  Weeks    Status  On-going 01/03/18 target date      PT LONG TERM GOAL  #11   TITLE  Patient will step up onto a 6" step 5x with AD independently to increase community mobility    Baseline  Pt. has increased difficulty requiring supervision for safety, but no physical assist    Time  12    Period  Weeks    Status  On-going 01/03/18 target date            Plan - 11/23/17 1107    Clinical Impression Statement  Patient demonstrated good core stability with functional rotational movement with increased challenge to left side due to horizontal component of motion. Patient challenged by reciprocating movement with noted scissoring with fatigue. LLE more affected than RLE with increased assistance for prone concentric motions. Patient will continue to benefit from skilled physical therapy in order to maximize functional mobility and safety.     Rehab Potential  Good    Clinical Impairments Affecting Rehab Potential  weakness and decreased standing balance    PT Frequency  1x / week    PT Duration  12 weeks    PT Treatment/Interventions  Therapeutic exercise;Therapeutic activities;Gait training;Balance training;Stair training;DME Instruction;Neuromuscular re-education;Patient/family education    PT Next Visit Plan  Continue POC    PT Home Exercise Plan  added seated dynamic core UE and LE lift offs with visual cues using mirror, supine marches    Consulted and Agree with Plan of Care  Patient       Patient will benefit from skilled therapeutic intervention in order to improve the following deficits and impairments:  Abnormal gait, Decreased balance, Decreased endurance, Difficulty walking, Decreased strength  Visit Diagnosis: Muscle weakness (generalized)  Other lack of coordination  Difficulty in walking, not elsewhere classified     Problem List There are no active problems to display for this patient.  Janna Arch, PT, DPT   Janna Arch 11/23/2017, 11:08 AM  [place] MAIN [facility] SERVICES [address] [place], [place], [address] Phone: [phone]   Fax:  [phone]  Name: Michelle Randall MRN: [id number] Date of Birth: Dec 24, 1995

## 2017-11-23 NOTE — Therapy (Signed)
[place] MAIN [facility] SERVICES [address] [place], [place], [address] Phone: [phone]   Fax:  [phone]  Occupational Therapy Treatment  Patient Details  Name: Michelle Randall MRN: [id number] Date of Birth: [birth year] No Data Recorded  Encounter Date: 11/23/2017  OT End of Session - 11/23/17 1450    Visit Number  105    Number of Visits  130    Date for OT Re-Evaluation  02/17/18    Authorization Type  medicaid visit 15 of 47    OT Start Time  [phone]    OT Stop Time  1015    OT Time Calculation (min)  47 min    Activity Tolerance  Patient tolerated treatment well    Behavior During Therapy  [facility] for tasks assessed/performed       History reviewed. No pertinent past medical history.  History reviewed. No pertinent surgical history.  There were no vitals filed for this visit.  Subjective Assessment - 11/23/17 0929    Subjective   Patient reports she was able to see one of her sisters this weekend and then went to see her brother in [place] for dinner. Patient reports it took her about an hour to get dressed while she was away on her trip.  She had to make plans to get up early in order to be on time for the conference.     Patient Stated Goals  To be as independent as possible at home and at school.     Currently in Pain?  No/denies    Pain Score  0-No pain                   OT Treatments/Exercises (OP) - 11/23/17 1441      ADLs   ADL Comments  Patient reassessed this date, goals updated.  Patient is doing well with goals towards improving independence with basic ADL skills.  Patient has recently purchased new bras that have a back closure and now has difficulty with donning and doffing.  She had been wearing a front closure bra.  Goal added to address this limitation.  Patient does still have difficulty with speed of getting dressed, occasional assist with buttons, occasional assist with donning earrings, pulling down  jeans over AFOs, and occasionally requires assist with socks and AFO if socks are new or she has just completed her shower.        Neurological Re-education Exercises   Other Exercises 1  Patient seen for reassessment this date of hand skills, grip strength right 50#, left 45#.  Pinch strength lateral right 14#, left 12#.  3 point pinch 15#, Left 15#.  2 point pinch 8#, left 8#.  9 hole peg test right 19 sec, left 21 sec.  Arm strength 4/5 overall, patient does lack sustained strength and endurance for repetitions of tasks such as lifiting bookbag, as well as operating her manual wheelchair over carpet.      Other Exercises 2  Patient seen for finger strengthening tasks this date with push pins to place in bulletin board that requires moderate resistance.  Cues for form and technique, performed with bilateral UEs.              OT Education - 11/23/17 1450    Education provided  Yes    Education Details  Plan of care, updated goals    Person(s) Educated  Patient    Methods  Explanation;Demonstration;Verbal cues    Comprehension  Verbalized  understanding;Returned demonstration;Verbal cues required          OT Long Term Goals - 11/23/17 0935      OT LONG TERM GOAL #1   Title  Patient will demonstrate increased strength in right hand sufficient to scoop ice cream from frozen container with modified independence.    Baseline  goal update:  patient still able to complete with minimal assist at times.    Time  12    Period  Months    Status  On-going      OT LONG TERM GOAL #2   Title  Patient will be able to make her bed with modified independence.    Baseline  she continues to have difficulty with balance and completing this task, increased time, but no assistance needed. Would like to work towards efficiency in time    Time  6    Period  Months    Status  On-going      OT LONG TERM GOAL #3   Title  Patient will complete back closure bra donning with modified independence.      Baseline  max assist at eval    Time  12    Period  Weeks    Status  New    Target Date  02/17/18      OT LONG TERM GOAL #4   Title  Patient will increase BUE strength by 1 mm grade to be able to self propel her wheelchair over carpet for distances greater than 300 feet    Baseline  short distance mobility over carpet about 100 feet before fatigue.     Time  12    Period  Weeks    Status  New    Target Date  02/17/18      OT LONG TERM GOAL #5   Title  Patient will demonstrate the ability to perform small buttons on shirts independently and with good speed in order to dress for school.    Baseline  goal update: Patient can perform with increased time but occasionally needs assist if rushed, still difficult at goal update 11/23/2017    Time  12    Period  Weeks    Status  Partially Met      OT LONG TERM GOAL #6   Title  Patient will donn and doff small earrings with modified independence.    Baseline  Goal update:  still able to hold small backs and dropping only 20% of the time, slow to complete but improving     Time  12    Period  Weeks    Status  Partially Met      OT LONG TERM GOAL #7   Title  Patient will be able to zip her rainboots while they are on her feet with modified independence.      Baseline  patient unable to fit into current boots secondary to new AFOs    Time  12    Period  Weeks    Status  Deferred      OT LONG TERM GOAL #8   Title  Patient to complete am self care demonstrating efficient methods of dressing skills to get to school on time.     Baseline  Goal update:  patient reports it takes about an hour to get dressed, running late one time this week.  Still working on efficiency and speed of tasks    Time  12    Period  Weeks  Status  Partially Met      OT LONG TERM GOAL  #9   Baseline  Patient will don/doff socks and AFO braces with modified independence using adaptive equipment as needed.  Socks and braces are more difficult after showering and if  socks are new, otherwise she can do it.     Time  12    Period  Weeks    Status  Partially Met      OT LONG TERM GOAL  #10   TITLE  Patient will demonstrate the ability to pull jeans down over AFOs with modified independence.     Baseline  continues to require min assist with this task 11/23/17    Time  12    Period  Weeks    Status  On-going      OT LONG TERM GOAL  #11   TITLE  Will be able to squeeze tooth paste out of the tube when it is less than half full.    Baseline  difficulty with small travel sized, may look into alternative ways since this has been a chronic issue.    Time  6    Period  Months    Status  Achieved      OT LONG TERM GOAL  #12   TITLE  Patient will demonstrate managing wheelchair rain poncho in inclement weather with modified independence.    Baseline  crutch tips are difficult for anyone to take them off, goal deferred.    Time  6    Period  Months    Status  Achieved      OT LONG TERM GOAL  #13   TITLE  Patient will be able to complete 2 1/2 minutes straight with left shoulder flexion simulating driving with a stearing wheel ball to be able to drive to college.     Baseline  Patient has not been driving, her family takes her to school and drops her off.     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     Baseline  Continued difficulty coordinating the use of both hands to complete task, speed still an issue 11/23/17    Status  Partially Met      OT LONG TERM GOAL  #15   TITLE  Patient will complete meal preparation with minimal assist.     Baseline  moderate assist 11/23/17    Time  12    Period  Weeks    Status  On-going            Plan - 11/23/17 1451    Clinical Impression Statement  Patient was reassessed this date as follows:  for self care, patient has  continued to work towards speed of being able to get ready in the mornings in order to get to school on time.  She has required assist one time in the last week to complete tasks on time.  While she was away for a trip and did not have mom to assist, it took her an hour to get dressed in the morning and she had to adjust her schedule to be at the conference on time. She has recently bought new bras which are now back closing (  previously work front closure) and is now requiring assist to don.  She occasionally requires assist with putting on earrings and to pull jeans over her AFOs. She has been able to make her bed without assist now but continues to work on speed and accuracy of task. Patient has advanced to being able to take a few school related trips alone this year without parents having to accompany her.  Patient continues to progress in all areas and continues to benefit from skilled OT services.  Recommend continued OT services 1 time a week for 12 weeks towards updated goals to maximize safety and independence in daily tasks at school, home and in the community.     Occupational Profile and client history currently impacting functional performance  decreased hand strength, balance and coordination.    Occupational performance deficits (Please refer to evaluation for details):  ADL's;IADL's;Leisure;Education    Rehab Potential  Good    OT Frequency  1x / week    OT Duration  12 weeks    OT Treatment/Interventions  Self-care/ADL training;DME and/or AE instruction;Therapeutic activities;Therapeutic exercise;Neuromuscular education;Patient/family education    Consulted and Agree with Plan of Care  Patient       Patient will benefit from skilled therapeutic intervention in order to improve the following deficits and impairments:  Impaired UE functional use, Difficulty walking, Decreased strength, Decreased coordination, Decreased endurance, Decreased balance  Visit Diagnosis: Muscle weakness  (generalized)  Other lack of coordination    Problem List There are no active problems to display for this patient.  Achilles Dunk, OTR/L, CLT  Lovett,Amy 11/23/2017, 3:01 PM  [place] MAIN [facility] SERVICES [address] [place], [place], [address] Phone: [phone]   Fax:  [phone]  Name: Michelle Randall MRN: [id number] Date of Birth: Sep 07, 1996

## 2017-12-01 ENCOUNTER — Ambulatory Visit: Payer: Medicaid Other | Admitting: Occupational Therapy

## 2017-12-01 ENCOUNTER — Ambulatory Visit: Payer: Medicaid Other

## 2017-12-01 DIAGNOSIS — M6281 Muscle weakness (generalized): Secondary | ICD-10-CM

## 2017-12-01 DIAGNOSIS — R262 Difficulty in walking, not elsewhere classified: Secondary | ICD-10-CM

## 2017-12-01 DIAGNOSIS — R278 Other lack of coordination: Secondary | ICD-10-CM

## 2017-12-01 NOTE — Therapy (Signed)
[place] MAIN [facility] SERVICES [address] [place], [place], [address] Phone: [phone]   Fax:  [phone]  Physical Therapy Treatment  Patient Details  Name: Michelle Randall MRN: [id number] Date of Birth: 05/21/1996 Referring Provider: Langley Gauss MD   Encounter Date: 12/01/2017  PT End of Session - 12/01/17 1019    Visit Number  114    Number of Visits  134    Date for PT Re-Evaluation  01/03/18    Authorization Type  6/12    Authorization Time Period  12 visits 12/11-01/16/18     PT Start Time  1019    PT Stop Time  1100    PT Time Calculation (min)  41 min    Equipment Utilized During Treatment  Gait belt    Activity Tolerance  Patient tolerated treatment well;Patient limited by fatigue    Behavior During Therapy  [facility] for tasks assessed/performed       History reviewed. No pertinent past medical history.  History reviewed. No pertinent surgical history.  There were no vitals filed for this visit.  Subjective Assessment - 12/01/17 1018    Subjective  Pt states that she is doing well today. She states that the most challenging thing for her to do right now is stepping up a curb. She denies pain currently and has no specific questions or concerns at this time.     Patient is accompained by:  Family member    Limitations  Walking    Patient Stated Goals  Patient wants to improve her core strength.     Currently in Pain?  No/denies          TREATMENT  Ther-ex  Prone hamstring curls with mod A to R min A to L x 10 bilateral; Pball HS curls x 10 bilateral; Pball bridges with knees straight x 10; Pball dynamic perturbation for abdominal strengthening, multiple bouts; Dynamic perturbation at 90 hip flexion in multiple directions x multiple bouts; Supine hip abduction with very light manual resistance 90 degree hip holds with D2 flexion with red tband; Dead bugs 2 x 10, minA+1 for LE control; Hooklying manually  resisted trunk  In // bars practiced high marches and assessed HS strength, pt with very strong LE flexion synergy with significant forward trunk lean with marching; Performed marches in // bars as well as toe taps on 5" step;  Side step-ups to 5" step x 5 each direction; Discussed working on climbing curbs as this is the most significant functional limitation pt is currently experiencing.   Pt. response to medical necessity: Patient will continue to benefit from skilled physical therapy in order to maximize functional mobility and safety.                     PT Education - 12/01/17 1316    Education provided  Yes    Education Details  Plan of care, goals    Person(s) Educated  Patient    Methods  Explanation;Demonstration    Comprehension  Verbalized understanding          PT Long Term Goals - 10/18/17 1146      PT LONG TERM GOAL #1   Title  Patient will improve Dynamic Gait Index (DGI) score to > 21/24 for meaningful improvement and low falls risk regarding dynamic walking tasks (revised from > 19/24 )    Baseline  14/24; 11/02/16: 15/24 01/28/17: 18/24 6/5: 19/24; 08/23/17 14/24,  14/24 10/11/17  Time  12    Period  Weeks    Status  On-going      PT LONG TERM GOAL #2   Title  Patient will increase Berg Balance score by >51/56 points to be considered a low risk for falls for improved safety. (revised from >6 points improvement)    Baseline  11/02/16: 34/56 01/28/17: 39/56 6/5: 45/56; 08/23/2017 = 45/56, 43/56 10/11/17    Time  12    Period  Weeks    Status  On-going      PT LONG TERM GOAL #3   Title  Patient will be able to transfer in and out of a large car with  a high seat with CGA  to improve ability to go to school/doctor visits.     Baseline  Patient requires min A for transfer into large SUV unless it has a step rail on the side    Time  12    Period  Weeks    Status  On-going      PT LONG TERM GOAL #4   Title  Patient will complete a TUG test  in < 12 seconds for independent mobility and decreased fall risk     Baseline  11.45; 08/23/2017 = 13.66 sec    Time  12    Period  Weeks    Status  On-going      PT LONG TERM GOAL #5   Title  Patient will improve 6 minute walk distance by > 150 ft for improved return to functional community activities     Baseline  740 01/28/2017: 768f 6/5: 795  730 on 07/19/17; 790 ft on 08/23/18,  740 feet    Time  12    Period  Weeks    Status  On-going      PT LONG TERM GOAL #6   Title  Patient will improve gait speed to > 1.2 m/s with least restrictive assistive device to return to normal walking speed     Baseline  .76 m/h 01/28/2017: .87  07/19/17 0.618m; 0.77 m/s on 08/23/17, . 58 m/sec 10/11/17    Time  12    Period  Weeks    Status  On-going 01/03/18 target dat      PT LONG TERM GOAL #7   Title  Patient (< 6044ears old) will complete five times sit to stand test in < 10 seconds indicating an increased LE strength and improved balance     Baseline  9.45 sec; 9.63 on 08/23/17, 9.63 sec    Time  12    Period  Weeks    Status  Achieved      PT LONG TERM GOAL #8   Title  Patient will be able to ambulate on inclines and grass independenlty with LRAD    Baseline  Can ambulate over grassy inclines with decreased speed and safety requiring need loftstrand curtches and SBA    Time  12    Period  Weeks    Status  On-going 01/03/18 target date      PT LONG TERM GOAL  #9   TITLE  Patient will be abe to transfer from low chair or stool without UE support independently    Baseline  Patient needs min A assist to transfer from a low stool    Time  12    Period  Weeks    Status  Partially Met 10/11/17 target date      PT LONG TERM GOAL  #10  TITLE  Patient will be able to transfer from the floor to standing independently with use of LRAD    Baseline  Patient able to stand from the floor independently if she has not been on the floor longer than about 5 min due to stiffness with prolonged positioning     Time  12    Period  Weeks    Status  On-going 01/03/18 target date      PT LONG TERM GOAL  #11   TITLE  Patient will step up onto a 6" step 5x with AD independently to increase community mobility    Baseline  Pt. has increased difficulty requiring supervision for safety, but no physical assist    Time  12    Period  Weeks    Status  On-going 01/03/18 target date            Plan - 12/01/17 1020    Clinical Impression Statement  Pt demonstrates excellent motivation with therapy today. She demonstrates good stability when challenging rotation in hooklying position. Pt has strong flexion synergy with hip flexion and knee flexion when attempting marches. Pt also with poor hamstring strength observed in prone and standing. Pt encouraged to continue HEP and follow-up as scheduled.     Clinical Presentation  Stable    Rehab Potential  Good    Clinical Impairments Affecting Rehab Potential  weakness and decreased standing balance    PT Frequency  1x / week    PT Duration  12 weeks    PT Treatment/Interventions  Therapeutic exercise;Therapeutic activities;Gait training;Balance training;Stair training;DME Instruction;Neuromuscular re-education;Patient/family education    PT Next Visit Plan  Continue POC    PT Home Exercise Plan  added seated dynamic core UE and LE lift offs with visual cues using mirror, supine marches    Consulted and Agree with Plan of Care  Patient       Patient will benefit from skilled therapeutic intervention in order to improve the following deficits and impairments:  Abnormal gait, Decreased balance, Decreased endurance, Difficulty walking, Decreased strength  Visit Diagnosis: Muscle weakness (generalized)  Difficulty in walking, not elsewhere classified     Problem List There are no active problems to display for this patient.  Phillips Grout PT, DPT   Peniel Hass 12/01/2017, 1:38 PM  [place] MAIN [facility]  SERVICES [address] [place], [place], [address] Phone: [phone]   Fax:  [phone]  Name: Michelle Randall MRN: [id number] Date of Birth: 1996-04-29

## 2017-12-02 ENCOUNTER — Encounter: Payer: Self-pay | Admitting: Occupational Therapy

## 2017-12-02 NOTE — Therapy (Signed)
[place] MAIN [facility] SERVICES [address] [place], [place], [address] Phone: [phone]   Fax:  [phone]  Occupational Therapy Treatment  Patient Details  Name: Michelle Randall MRN: [id number] Date of Birth: 27-Jan-1996 Referring Provider: Langley Gauss MD   Encounter Date: 12/01/2017  OT End of Session - 12/02/17 1752    Visit Number  106    Number of Visits  130    Date for OT Re-Evaluation  02/17/18    Authorization Type  medicaid visit 34 of 118    OT Start Time  0920    OT Stop Time  1007    OT Time Calculation (min)  47 min    Activity Tolerance  Patient tolerated treatment well    Behavior During Therapy  [facility] for tasks assessed/performed       History reviewed. No pertinent past medical history.  History reviewed. No pertinent surgical history.  There were no vitals filed for this visit.  Subjective Assessment - 12/02/17 1741    Subjective   Patient reports she got her finger caught in a door at school she was trying to open from wheelchair level, she reports it bled and she had to have first aid.  She is talking about having a class across campus next semester and will need to ride the biobus to get there. She is hoping it will be better than last semester when the lifts on the bus were not working and she missed multiple classes.  Discussed with her options to call transportation or the disabilities office to ensure they have made appropriate accomodations for her to get to her classes.     Patient Stated Goals  To be as independent as possible at home and at school.     Currently in Pain?  No/denies    Pain Score  0-No pain                   OT Treatments/Exercises (OP) - 12/02/17 1746      Neurological Re-education Exercises   Other Exercises 1  Patient seen for BUE strengthening exercises with red theraband for 2 sets of 15 reps for shoulder flexion, diagonal patterns, elbow flexion/extension with  cues for techniques.       Fine Motor Coordination (Hand/Wrist)   Other Fine Motor Exercises  Patient seen for fine motor coordination techniques with small 1/2 inch items, picking up and moving thru the hand with translatory movements, using the hand for storage and back to fingertips to place into container.  Cues for prehension patterns.  Patient performing unknotting of medium nylon rope with moderately resistive tied knots, occasional cues required for knots which were tighter.              OT Education - 12/02/17 1750    Education provided  Yes    Education Details  prehension patterns, strengthening tasks    Person(s) Educated  Patient    Methods  Explanation;Demonstration;Verbal cues    Comprehension  Verbal cues required;Returned demonstration;Verbalized understanding          OT Long Term Goals - 11/23/17 0935      OT LONG TERM GOAL #1   Title  Patient will demonstrate increased strength in right hand sufficient to scoop ice cream from frozen container with modified independence.    Baseline  goal update:  patient still able to complete with minimal assist at times.    Time  12  Period  Months    Status  On-going      OT LONG TERM GOAL #2   Title  Patient will be able to make her bed with modified independence.    Baseline  she continues to have difficulty with balance and completing this task, increased time, but no assistance needed. Would like to work towards efficiency in time    Time  6    Period  Months    Status  On-going      OT LONG TERM GOAL #3   Title  Patient will complete back closure bra donning with modified independence.     Baseline  max assist at eval    Time  12    Period  Weeks    Status  New    Target Date  02/17/18      OT LONG TERM GOAL #4   Title  Patient will increase BUE strength by 1 mm grade to be able to self propel her wheelchair over carpet for distances greater than 300 feet    Baseline  short distance mobility over carpet  about 100 feet before fatigue.     Time  12    Period  Weeks    Status  New    Target Date  02/17/18      OT LONG TERM GOAL #5   Title  Patient will demonstrate the ability to perform small buttons on shirts independently and with good speed in order to dress for school.    Baseline  goal update: Patient can perform with increased time but occasionally needs assist if rushed, still difficult at goal update 11/23/2017    Time  12    Period  Weeks    Status  Partially Met      OT LONG TERM GOAL #6   Title  Patient will donn and doff small earrings with modified independence.    Baseline  Goal update:  still able to hold small backs and dropping only 20% of the time, slow to complete but improving     Time  12    Period  Weeks    Status  Partially Met      OT LONG TERM GOAL #7   Title  Patient will be able to zip her rainboots while they are on her feet with modified independence.      Baseline  patient unable to fit into current boots secondary to new AFOs    Time  12    Period  Weeks    Status  Deferred      OT LONG TERM GOAL #8   Title  Patient to complete am self care demonstrating efficient methods of dressing skills to get to school on time.     Baseline  Goal update:  patient reports it takes about an hour to get dressed, running late one time this week.  Still working on efficiency and speed of tasks    Time  12    Period  Weeks    Status  Partially Met      OT LONG TERM GOAL  #9   Baseline  Patient will don/doff socks and AFO braces with modified independence using adaptive equipment as needed.  Socks and braces are more difficult after showering and if socks are new, otherwise she can do it.     Time  12    Period  Weeks    Status  Partially Met      OT LONG  TERM GOAL  #10   TITLE  Patient will demonstrate the ability to pull jeans down over AFOs with modified independence.     Baseline  continues to require min assist with this task 11/23/17    Time  12    Period   Weeks    Status  On-going      OT LONG TERM GOAL  #11   TITLE  Will be able to squeeze tooth paste out of the tube when it is less than half full.    Baseline  difficulty with small travel sized, may look into alternative ways since this has been a chronic issue.    Time  6    Period  Months    Status  Achieved      OT LONG TERM GOAL  #12   TITLE  Patient will demonstrate managing wheelchair rain poncho in inclement weather with modified independence.    Baseline  crutch tips are difficult for anyone to take them off, goal deferred.    Time  6    Period  Months    Status  Achieved      OT LONG TERM GOAL  #13   TITLE  Patient will be able to complete 2 1/2 minutes straight with left shoulder flexion simulating driving with a stearing wheel ball to be able to drive to college.     Baseline  Patient has not been driving, her family takes her to school and drops her off.     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     Baseline  Continued difficulty coordinating the use of both hands to complete task, speed still an issue 11/23/17    Status  Partially Met      OT LONG TERM GOAL  #15   TITLE  Patient will complete meal preparation with minimal assist.     Baseline  moderate assist 11/23/17    Time  12    Period  Weeks    Status  On-going            Plan - 12/02/17 1753    Clinical Impression Statement  Patient continues to progress with goals to improve strength and coordination.  She would like to be able to self propel her wheelchair for longer distances. Patient will plan to wear back closure bra next session to work towards donning and doffing.      Occupational Profile and client history currently impacting functional performance  decreased hand strength, balance and coordination.     Occupational performance deficits (Please refer to evaluation for details):  ADL's;IADL's;Leisure;Education    Rehab Potential  Good    OT Frequency  1x / week    OT Duration  12 weeks    OT Treatment/Interventions  Self-care/ADL training;DME and/or AE instruction;Therapeutic activities;Therapeutic exercise;Neuromuscular education;Patient/family education    Consulted and Agree with Plan of Care  Patient       Patient will benefit from skilled therapeutic intervention in order to improve the following deficits and impairments:  Impaired UE functional use, Difficulty walking, Decreased strength, Decreased coordination, Decreased endurance, Decreased balance  Visit Diagnosis: Muscle weakness (generalized)  Other lack of coordination    Problem List There  are no active problems to display for this patient.  Achilles Dunk, OTR/L, CLT Thetis Schwimmer 12/02/2017, 5:57 PM  [place] MAIN [facility] SERVICES [address] [place], [place], [address] Phone: [phone]   Fax:  [phone]  Name: ZORIANNA TALIAFERRO MRN: [id number] Date of Birth: 01-26-1996

## 2017-12-08 ENCOUNTER — Ambulatory Visit: Payer: Medicaid Other | Admitting: Occupational Therapy

## 2017-12-08 ENCOUNTER — Encounter: Payer: Self-pay | Admitting: Occupational Therapy

## 2017-12-08 ENCOUNTER — Ambulatory Visit: Payer: Medicaid Other | Admitting: Physical Therapy

## 2017-12-08 ENCOUNTER — Encounter: Payer: Self-pay | Admitting: Physical Therapy

## 2017-12-08 DIAGNOSIS — R278 Other lack of coordination: Secondary | ICD-10-CM

## 2017-12-08 DIAGNOSIS — R262 Difficulty in walking, not elsewhere classified: Secondary | ICD-10-CM

## 2017-12-08 DIAGNOSIS — M6281 Muscle weakness (generalized): Secondary | ICD-10-CM

## 2017-12-08 DIAGNOSIS — R279 Unspecified lack of coordination: Secondary | ICD-10-CM

## 2017-12-08 NOTE — Therapy (Signed)
[place] MAIN [facility] SERVICES [address] [place], [place], [address] Phone: [phone]   Fax:  [phone]  Physical Therapy Treatment  Patient Details  Name: Michelle Randall MRN: [id number] Date of Birth: 09/25/1996 Referring Provider: Langley Gauss MD   Encounter Date: 12/08/2017  PT End of Session - 12/08/17 1019    Visit Number  115    Number of Visits  134    Date for PT Re-Evaluation  01/03/18    PT Start Time  5852    PT Stop Time  1100    PT Time Calculation (min)  45 min    Equipment Utilized During Treatment  Gait belt    Activity Tolerance  Patient tolerated treatment well;Patient limited by fatigue    Behavior During Therapy  [facility] for tasks assessed/performed       History reviewed. No pertinent past medical history.  History reviewed. No pertinent surgical history.  There were no vitals filed for this visit.  Subjective Assessment - 12/08/17 1016    Subjective  Patient is doing well today and has no pain.     Patient is accompained by:  Family member    Limitations  Walking    Patient Stated Goals  Patient wants to improve her core strength.     Currently in Pain?  No/denies    Pain Score  0-No pain    Pain Onset  In the past 7 days       Therapeutic exercise:  Nu-step x 5 mins ( warm -up)  Step ups to 6 inch stool x 20 left side and right side with UE support  Step bacwards to 2 inch stool with UE support x 10 each LE  Side steps left and right to 2 inch stool x 10 left and 10 right   Hip abd x 10 BLE  Hip extension x 10 BLE   CGA and Min to mod verbal cues used throughout with increased in postural sway and LOB most seen with narrow base of support and while on uneven surfaces. Continues to have balance deficits typical with diagnosis.                      PT Education - 12/08/17 1018    Education provided  Yes    Education Details  saftey with ambulation and steps    Person(s) Educated  Patient    Methods  Explanation    Comprehension  Verbalized understanding          PT Long Term Goals - 10/18/17 1146      PT LONG TERM GOAL #1   Title  Patient will improve Dynamic Gait Index (DGI) score to > 21/24 for meaningful improvement and low falls risk regarding dynamic walking tasks (revised from > 19/24 )    Baseline  14/24; 11/02/16: 15/24 01/28/17: 18/24 6/5: 19/24; 08/23/17 14/24,  14/24 10/11/17    Time  12    Period  Weeks    Status  On-going      PT LONG TERM GOAL #2   Title  Patient will increase Berg Balance score by >51/56 points to be considered a low risk for falls for improved safety. (revised from >6 points improvement)    Baseline  11/02/16: 34/56 01/28/17: 39/56 6/5: 45/56; 08/23/2017 = 45/56, 43/56 10/11/17    Time  12    Period  Weeks    Status  On-going  PT LONG TERM GOAL #3   Title  Patient will be able to transfer in and out of a large car with  a high seat with CGA  to improve ability to go to school/doctor visits.     Baseline  Patient requires min A for transfer into large SUV unless it has a step rail on the side    Time  12    Period  Weeks    Status  On-going      PT LONG TERM GOAL #4   Title  Patient will complete a TUG test in < 12 seconds for independent mobility and decreased fall risk     Baseline  11.45; 08/23/2017 = 13.66 sec    Time  12    Period  Weeks    Status  On-going      PT LONG TERM GOAL #5   Title  Patient will improve 6 minute walk distance by > 150 ft for improved return to functional community activities     Baseline  740 01/28/2017: 739f 6/5: 795  730 on 07/19/17; 790 ft on 08/23/18,  740 feet    Time  12    Period  Weeks    Status  On-going      PT LONG TERM GOAL #6   Title  Patient will improve gait speed to > 1.2 m/s with least restrictive assistive device to return to normal walking speed     Baseline  .76 m/h 01/28/2017: .87  07/19/17 0.669m; 0.77 m/s on 08/23/17, . 58 m/sec 10/11/17    Time   12    Period  Weeks    Status  On-going 01/03/18 target dat      PT LONG TERM GOAL #7   Title  Patient (< [age]ears old) will complete five times sit to stand test in < 10 seconds indicating an increased LE strength and improved balance     Baseline  9.45 sec; 9.63 on 08/23/17, 9.63 sec    Time  12    Period  Weeks    Status  Achieved      PT LONG TERM GOAL #8   Title  Patient will be able to ambulate on inclines and grass independenlty with LRAD    Baseline  Can ambulate over grassy inclines with decreased speed and safety requiring need loftstrand curtches and SBA    Time  12    Period  Weeks    Status  On-going 01/03/18 target date      PT LONG TERM GOAL  #9   TITLE  Patient will be abe to transfer from low chair or stool without UE support independently    Baseline  Patient needs min A assist to transfer from a low stool    Time  12    Period  Weeks    Status  Partially Met 10/11/17 target date      PT LONG TERM GOAL  #10   TITLE  Patient will be able to transfer from the floor to standing independently with use of LRAD    Baseline  Patient able to stand from the floor independently if she has not been on the floor longer than about 5 min due to stiffness with prolonged positioning    Time  12    Period  Weeks    Status  On-going 01/03/18 target date      PT LONG TERM GOAL  #11   TITLE  Patient will step up onto  a 6" step 5x with AD independently to increase community mobility    Baseline  Pt. has increased difficulty requiring supervision for safety, but no physical assist    Time  12    Period  Weeks    Status  On-going 01/03/18 target date            Plan - 12/08/17 1020    Clinical Impression Statement  Patient performs trunk, core and LE exercises with short rest periods to improve strength and mobiltiy. She has fatigue with step up exercise to 6 inch stool and eccentric step downs from 2 inch stool. She will continue to benefit from skilled PT to improve balance and  strength.     Rehab Potential  Good    Clinical Impairments Affecting Rehab Potential  weakness and decreased standing balance    PT Frequency  1x / week    PT Duration  12 weeks    PT Treatment/Interventions  Therapeutic exercise;Therapeutic activities;Gait training;Balance training;Stair training;DME Instruction;Neuromuscular re-education;Patient/family education    PT Next Visit Plan  Continue POC    PT Home Exercise Plan  added seated dynamic core UE and LE lift offs with visual cues using mirror, supine marches    Consulted and Agree with Plan of Care  Patient       Patient will benefit from skilled therapeutic intervention in order to improve the following deficits and impairments:  Abnormal gait, Decreased balance, Decreased endurance, Difficulty walking, Decreased strength  Visit Diagnosis: Muscle weakness (generalized)  Difficulty in walking, not elsewhere classified  Other lack of coordination  Lack of coordination     Problem List There are no active problems to display for this patient.   [address], [place] DPT 12/08/2017, 10:40 AM  [place] MAIN [facility] SERVICES [place], [place], [address] Phone: [phone]   Fax:  [phone]  Name: Michelle Randall MRN: [id number] Date of Birth: 06-09-1996

## 2017-12-13 ENCOUNTER — Ambulatory Visit: Payer: Medicaid Other | Admitting: Occupational Therapy

## 2017-12-13 ENCOUNTER — Ambulatory Visit: Payer: Medicaid Other | Admitting: Physical Therapy

## 2017-12-13 ENCOUNTER — Encounter: Payer: Self-pay | Admitting: Physical Therapy

## 2017-12-13 DIAGNOSIS — R278 Other lack of coordination: Secondary | ICD-10-CM

## 2017-12-13 DIAGNOSIS — R279 Unspecified lack of coordination: Secondary | ICD-10-CM

## 2017-12-13 DIAGNOSIS — M6281 Muscle weakness (generalized): Secondary | ICD-10-CM

## 2017-12-13 DIAGNOSIS — R262 Difficulty in walking, not elsewhere classified: Secondary | ICD-10-CM

## 2017-12-13 NOTE — Therapy (Signed)
[place] MAIN [facility] SERVICES [address] [place], [place], [address] Phone: [phone]   Fax:  [phone]  Physical Therapy Treatment  Patient Details  Name: Michelle Randall MRN: [id number] Date of Birth: [birth year] Referring Provider: Langley Gauss MD   Encounter Date: 12/13/2017  PT End of Session - 12/13/17 1133    Visit Number  116    Number of Visits  134    Date for PT Re-Evaluation  01/03/18    Authorization Type  8/12    Authorization Time Period  12 visits 12/11-01/16/18     PT Start Time  1015    PT Stop Time  1100    PT Time Calculation (min)  45 min    Equipment Utilized During Treatment  Gait belt    Activity Tolerance  Patient tolerated treatment well;Patient limited by fatigue    Behavior During Therapy  [facility] for tasks assessed/performed       History reviewed. No pertinent past medical history.  History reviewed. No pertinent surgical history.  There were no vitals filed for this visit.  Subjective Assessment - 12/13/17 1133    Subjective  Patient is doing well today and has no pain. She has no new concerns.    Patient is accompained by:  Family member    Limitations  Walking    Patient Stated Goals  Patient wants to improve her core strength.     Currently in Pain?  No/denies    Pain Score  0-No pain    Pain Onset  In the past 7 days      Treatment: Nu-step warm up x 5 mins  Planks prone, sidelying left and right and supine x 30 sec x 3, verbal cues for correct positioning  Quadriped UE extension and LE extension x 5 sec hold , cues for keeping her pelvis stable  Tall kneeling with core chops x 10 x 2; cues to keep posture errect  Prone knee flex x 30 sec x 3 BLE, cues to keep hips on table   Patient has fatigue after exercises and needs short rest periods during treatment. She needs CGA and VC for correct positioning. She                          PT Education - 12/13/17  1133    Education provided  Yes    Education Details  safety with steps    Person(s) Educated  Patient    Methods  Explanation    Comprehension  Verbalized understanding          PT Long Term Goals - 10/18/17 1146      PT LONG TERM GOAL #1   Title  Patient will improve Dynamic Gait Index (DGI) score to > 21/24 for meaningful improvement and low falls risk regarding dynamic walking tasks (revised from > 19/24 )    Baseline  14/24; 11/02/16: 15/24 01/28/17: 18/24 6/5: 19/24; 08/23/17 14/24,  14/24 10/11/17    Time  12    Period  Weeks    Status  On-going      PT LONG TERM GOAL #2   Title  Patient will increase Berg Balance score by >51/56 points to be considered a low risk for falls for improved safety. (revised from >6 points improvement)    Baseline  11/02/16: 34/56 01/28/17: 39/56 6/5: 45/56; 08/23/2017 = 45/56, 43/56 10/11/17    Time  12  Period  Weeks    Status  On-going      PT LONG TERM GOAL #3   Title  Patient will be able to transfer in and out of a large car with  a high seat with CGA  to improve ability to go to school/doctor visits.     Baseline  Patient requires min A for transfer into large SUV unless it has a step rail on the side    Time  12    Period  Weeks    Status  On-going      PT LONG TERM GOAL #4   Title  Patient will complete a TUG test in < 12 seconds for independent mobility and decreased fall risk     Baseline  11.45; 08/23/2017 = 13.66 sec    Time  12    Period  Weeks    Status  On-going      PT LONG TERM GOAL #5   Title  Patient will improve 6 minute walk distance by > 150 ft for improved return to functional community activities     Baseline  740 01/28/2017: 761f 6/5: 795  730 on 07/19/17; 790 ft on 08/23/18,  740 feet    Time  12    Period  Weeks    Status  On-going      PT LONG TERM GOAL #6   Title  Patient will improve gait speed to > 1.2 m/s with least restrictive assistive device to return to normal walking speed     Baseline  .76 m/h  01/28/2017: .87  07/19/17 0.674m; 0.77 m/s on 08/23/17, . 58 m/sec 10/11/17    Time  12    Period  Weeks    Status  On-going 01/03/18 target dat      PT LONG TERM GOAL #7   Title  Patient (< 608ears old) will complete five times sit to stand test in < 10 seconds indicating an increased LE strength and improved balance     Baseline  9.45 sec; 9.63 on 08/23/17, 9.63 sec    Time  12    Period  Weeks    Status  Achieved      PT LONG TERM GOAL #8   Title  Patient will be able to ambulate on inclines and grass independenlty with LRAD    Baseline  Can ambulate over grassy inclines with decreased speed and safety requiring need loftstrand curtches and SBA    Time  12    Period  Weeks    Status  On-going 01/03/18 target date      PT LONG TERM GOAL  #9   TITLE  Patient will be abe to transfer from low chair or stool without UE support independently    Baseline  Patient needs min A assist to transfer from a low stool    Time  12    Period  Weeks    Status  Partially Met 10/11/17 target date      PT LONG TERM GOAL  #10   TITLE  Patient will be able to transfer from the floor to standing independently with use of LRAD    Baseline  Patient able to stand from the floor independently if she has not been on the floor longer than about 5 min due to stiffness with prolonged positioning    Time  12    Period  Weeks    Status  On-going 01/03/18 target date      PT  LONG TERM GOAL  #11   TITLE  Patient will step up onto a 6" step 5x with AD independently to increase community mobility    Baseline  Pt. has increased difficulty requiring supervision for safety, but no physical assist    Time  12    Period  Weeks    Status  On-going 01/03/18 target date            Plan - 12/13/17 1135    Clinical Impression Statement  Patient performs trunk, core and LE exercises with short rest periods to improve strength and mobiltiy. She has fatigue with quadriped exercises and tall kneeling exercises. She will  continue to benefit from skilled PT to improve balance and strength.    Rehab Potential  Good    Clinical Impairments Affecting Rehab Potential  weakness and decreased standing balance    PT Frequency  1x / week    PT Duration  12 weeks    PT Treatment/Interventions  Therapeutic exercise;Therapeutic activities;Gait training;Balance training;Stair training;DME Instruction;Neuromuscular re-education;Patient/family education    PT Next Visit Plan  Continue POC    PT Home Exercise Plan  added seated dynamic core UE and LE lift offs with visual cues using mirror, supine marches    Consulted and Agree with Plan of Care  Patient       Patient will benefit from skilled therapeutic intervention in order to improve the following deficits and impairments:  Abnormal gait, Decreased balance, Decreased endurance, Difficulty walking, Decreased strength  Visit Diagnosis: Muscle weakness (generalized)  Difficulty in walking, not elsewhere classified  Other lack of coordination  Lack of coordination     Problem List There are no active problems to display for this patient.   [address], [place] DPT 12/13/2017, 11:46 AM  [place] MAIN [facility] SERVICES [place], [place], [address] Phone: [phone]   Fax:  [phone]  Name: TYREKA HENNEKE MRN: [id number] Date of Birth: [birth year]

## 2017-12-14 NOTE — Therapy (Signed)
[place] MAIN [facility] SERVICES [address] [place], [place], [address] Phone: [phone]   Fax:  [phone]  Occupational Therapy Treatment  Patient Details  Name: Michelle Randall MRN: [id number] Date of Birth: 1996-08-08 Referring Provider: Langley Gauss MD   Encounter Date: 12/13/2017  OT End of Session - 12/14/17 1659    Visit Number  108    Number of Visits  130    Date for OT Re-Evaluation  02/17/18    Authorization Type  medicaid visit 3 of 118    OT Start Time  0930    OT Stop Time  1014    OT Time Calculation (min)  44 min    Activity Tolerance  Patient tolerated treatment well    Behavior During Therapy  [facility] for tasks assessed/performed       No past medical history on file.  No past surgical history on file.  There were no vitals filed for this visit.  Subjective Assessment - 12/14/17 1657    Subjective   Patient reports her classes start on 12/19/17 for spring semester at [employer]  She is planning to go with her mom to help her sister care for her neice for a couple days this week    Patient Stated Goals  To be as independent as possible at home and at school.     Currently in Pain?  No/denies    Pain Score  0-No pain                   OT Treatments/Exercises (OP) - 12/14/17 1833      Neurological Re-education Exercises   Other Exercises 1  Patient seen for BUE strengthening exercises with 3# dowel for shoulder flexion, ABD/ADD, chest press, forwards and backwards circles, 2 sets of 12 repetitions each.  Finger strengthening with bulletin board and push pins with moderate resistance.  Cues for technique to push all the way into the board.        Fine Motor Coordination (Hand/Wrist)   Other Fine Motor Exercises  Patient seen for manipulation of cards with emphasis on thumb/finger combinations while flipping cards, alternating and sorting by color, suit, cues for technique for shuffling.               OT Education - 12/14/17 1659    Education provided  Yes    Education Details  card sorting and thumb finger combinations for improved speed and dexterity.     Person(s) Educated  Patient    Methods  Explanation;Demonstration;Verbal cues    Comprehension  Verbal cues required;Returned demonstration;Verbalized understanding          OT Long Term Goals - 11/23/17 0935      OT LONG TERM GOAL #1   Title  Patient will demonstrate increased strength in right hand sufficient to scoop ice cream from frozen container with modified independence.    Baseline  goal update:  patient still able to complete with minimal assist at times.    Time  12    Period  Months    Status  On-going      OT LONG TERM GOAL #2   Title  Patient will be able to make her bed with modified independence.    Baseline  she continues to have difficulty with balance and completing this task, increased time, but no assistance needed. Would like to work towards efficiency in time    Time  6  Period  Months    Status  On-going      OT LONG TERM GOAL #3   Title  Patient will complete back closure bra donning with modified independence.     Baseline  max assist at eval    Time  12    Period  Weeks    Status  New    Target Date  02/17/18      OT LONG TERM GOAL #4   Title  Patient will increase BUE strength by 1 mm grade to be able to self propel her wheelchair over carpet for distances greater than 300 feet    Baseline  short distance mobility over carpet about 100 feet before fatigue.     Time  12    Period  Weeks    Status  New    Target Date  02/17/18      OT LONG TERM GOAL #5   Title  Patient will demonstrate the ability to perform small buttons on shirts independently and with good speed in order to dress for school.    Baseline  goal update: Patient can perform with increased time but occasionally needs assist if rushed, still difficult at goal update 11/23/2017    Time  12    Period  Weeks     Status  Partially Met      OT LONG TERM GOAL #6   Title  Patient will donn and doff small earrings with modified independence.    Baseline  Goal update:  still able to hold small backs and dropping only 20% of the time, slow to complete but improving     Time  12    Period  Weeks    Status  Partially Met      OT LONG TERM GOAL #7   Title  Patient will be able to zip her rainboots while they are on her feet with modified independence.      Baseline  patient unable to fit into current boots secondary to new AFOs    Time  12    Period  Weeks    Status  Deferred      OT LONG TERM GOAL #8   Title  Patient to complete am self care demonstrating efficient methods of dressing skills to get to school on time.     Baseline  Goal update:  patient reports it takes about an hour to get dressed, running late one time this week.  Still working on efficiency and speed of tasks    Time  12    Period  Weeks    Status  Partially Met      OT LONG TERM GOAL  #9   Baseline  Patient will don/doff socks and AFO braces with modified independence using adaptive equipment as needed.  Socks and braces are more difficult after showering and if socks are new, otherwise she can do it.     Time  12    Period  Weeks    Status  Partially Met      OT LONG TERM GOAL  #10   TITLE  Patient will demonstrate the ability to pull jeans down over AFOs with modified independence.     Baseline  continues to require min assist with this task 11/23/17    Time  12    Period  Weeks    Status  On-going      OT LONG TERM GOAL  #11   TITLE  Will be able  to squeeze tooth paste out of the tube when it is less than half full.    Baseline  difficulty with small travel sized, may look into alternative ways since this has been a chronic issue.    Time  6    Period  Months    Status  Achieved      OT LONG TERM GOAL  #12   TITLE  Patient will demonstrate managing wheelchair rain poncho in inclement weather with modified  independence.    Baseline  crutch tips are difficult for anyone to take them off, goal deferred.    Time  6    Period  Months    Status  Achieved      OT LONG TERM GOAL  #13   TITLE  Patient will be able to complete 2 1/2 minutes straight with left shoulder flexion simulating driving with a stearing wheel ball to be able to drive to college.     Baseline  Patient has not been driving, her family takes her to school and drops her off.     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     Baseline  Continued difficulty coordinating the use of both hands to complete task, speed still an issue 11/23/17    Status  Partially Met      OT LONG TERM GOAL  #15   TITLE  Patient will complete meal preparation with minimal assist.     Baseline  moderate assist 11/23/17    Time  12    Period  Weeks    Status  On-going            Plan - 12/14/17 1700    Clinical Impression Statement  Pt continues to work towards strength, fine motor coordination, speed and dexterity to perform functional tasks.  She will continue with exercises at home to work on these skills.  Continue towards goals to increase independence in daily tasks.     Occupational Profile and client history currently impacting functional performance  decreased hand strength, balance and coordination.    Occupational performance deficits (Please refer to evaluation for details):  ADL's;IADL's;Leisure;Education    Rehab Potential  Good    OT Frequency  1x / week    OT Duration  12 weeks    OT Treatment/Interventions  Self-care/ADL training;DME and/or AE instruction;Therapeutic activities;Therapeutic exercise;Neuromuscular education;Patient/family education    Consulted and Agree with Plan of Care  Patient       Patient will benefit from skilled therapeutic  intervention in order to improve the following deficits and impairments:  Impaired UE functional use, Difficulty walking, Decreased strength, Decreased coordination, Decreased endurance, Decreased balance  Visit Diagnosis: Muscle weakness (generalized)  Other lack of coordination    Problem List There are no active problems to display for this patient.  Achilles Dunk, OTR/L, CLT  Dquan Cortopassi 12/14/2017, 6:40 PM  [place] MAIN [facility] SERVICES [address] [place], [place], [address] Phone: [phone]   Fax:  [phone]  Name: Michelle Randall MRN: [id number] Date of Birth: [birth year]

## 2017-12-14 NOTE — Therapy (Signed)
[place] MAIN [facility] SERVICES [address] [place], [place], [address] Phone: [phone]   Fax:  [phone]  Occupational Therapy Treatment  Patient Details  Name: Michelle Randall MRN: [id number] Date of Birth: November 19, 1995 Referring Provider: Langley Gauss MD   Encounter Date: 12/08/2017  OT End of Session - 12/14/17 1652    Visit Number  107    Number of Visits  130    Date for OT Re-Evaluation  02/17/18    Authorization Type  medicaid visit 67 of 118    OT Start Time  0930    OT Stop Time  1015    OT Time Calculation (min)  45 min    Activity Tolerance  Patient tolerated treatment well    Behavior During Therapy  [facility] for tasks assessed/performed       History reviewed. No pertinent past medical history.  History reviewed. No pertinent surgical history.  There were no vitals filed for this visit.  Subjective Assessment - 12/14/17 1646    Subjective   Patient reports she just finished up her winter term at school and has a short break before the spring semester starts.  She denies any pain this date. No complaints.     Patient Stated Goals  To be as independent as possible at home and at school.     Currently in Pain?  No/denies    Pain Score  0-No pain                   OT Treatments/Exercises (OP) - 12/14/17 1648      ADLs   ADL Comments  Patient seen for donning and doffing back closure bra this date with 2 different methods performed.  For bras which she has worn and are stretched out, she is putting them overhead with closure already completed.  For newer bras and nicer bras that do not have stretch, she was instructed on donning the closure in the front and then turning the bra around to put on the straps.  She was able to complete multiple repetitions with cues.  She will plan to bring in newer bra to try in the next few sessions. Patient seen for instruction on threading a needle and being able to sew a  button onto clothing, managing scissors.        Fine Motor Coordination (Hand/Wrist)   Other Fine Motor Exercises  Patient seen for manipulation of [place] discs with right hand then left hand isolated followed by performance with both hand simultaneously with emphasis on speed, dexterity and coordination of the task.              OT Education - 12/14/17 1651    Education provided  Yes    Education Details  bra donning, button sewing    Person(s) Educated  Patient    Methods  Explanation;Demonstration;Verbal cues    Comprehension  Returned demonstration;Verbal cues required;Verbalized understanding          OT Long Term Goals - 11/23/17 0935      OT LONG TERM GOAL #1   Title  Patient will demonstrate increased strength in right hand sufficient to scoop ice cream from frozen container with modified independence.    Baseline  goal update:  patient still able to complete with minimal assist at times.    Time  12    Period  Months    Status  On-going      OT LONG TERM  GOAL #2   Title  Patient will be able to make her bed with modified independence.    Baseline  she continues to have difficulty with balance and completing this task, increased time, but no assistance needed. Would like to work towards efficiency in time    Time  6    Period  Months    Status  On-going      OT LONG TERM GOAL #3   Title  Patient will complete back closure bra donning with modified independence.     Baseline  max assist at eval    Time  12    Period  Weeks    Status  New    Target Date  02/17/18      OT LONG TERM GOAL #4   Title  Patient will increase BUE strength by 1 mm grade to be able to self propel her wheelchair over carpet for distances greater than 300 feet    Baseline  short distance mobility over carpet about 100 feet before fatigue.     Time  12    Period  Weeks    Status  New    Target Date  02/17/18      OT LONG TERM GOAL #5   Title  Patient will demonstrate the ability  to perform small buttons on shirts independently and with good speed in order to dress for school.    Baseline  goal update: Patient can perform with increased time but occasionally needs assist if rushed, still difficult at goal update 11/23/2017    Time  12    Period  Weeks    Status  Partially Met      OT LONG TERM GOAL #6   Title  Patient will donn and doff small earrings with modified independence.    Baseline  Goal update:  still able to hold small backs and dropping only 20% of the time, slow to complete but improving     Time  12    Period  Weeks    Status  Partially Met      OT LONG TERM GOAL #7   Title  Patient will be able to zip her rainboots while they are on her feet with modified independence.      Baseline  patient unable to fit into current boots secondary to new AFOs    Time  12    Period  Weeks    Status  Deferred      OT LONG TERM GOAL #8   Title  Patient to complete am self care demonstrating efficient methods of dressing skills to get to school on time.     Baseline  Goal update:  patient reports it takes about an hour to get dressed, running late one time this week.  Still working on efficiency and speed of tasks    Time  12    Period  Weeks    Status  Partially Met      OT LONG TERM GOAL  #9   Baseline  Patient will don/doff socks and AFO braces with modified independence using adaptive equipment as needed.  Socks and braces are more difficult after showering and if socks are new, otherwise she can do it.     Time  12    Period  Weeks    Status  Partially Met      OT LONG TERM GOAL  #10   TITLE  Patient will demonstrate the ability to pull jeans down  over AFOs with modified independence.     Baseline  continues to require min assist with this task 11/23/17    Time  12    Period  Weeks    Status  On-going      OT LONG TERM GOAL  #11   TITLE  Will be able to squeeze tooth paste out of the tube when it is less than half full.    Baseline  difficulty with  small travel sized, may look into alternative ways since this has been a chronic issue.    Time  6    Period  Months    Status  Achieved      OT LONG TERM GOAL  #12   TITLE  Patient will demonstrate managing wheelchair rain poncho in inclement weather with modified independence.    Baseline  crutch tips are difficult for anyone to take them off, goal deferred.    Time  6    Period  Months    Status  Achieved      OT LONG TERM GOAL  #13   TITLE  Patient will be able to complete 2 1/2 minutes straight with left shoulder flexion simulating driving with a stearing wheel ball to be able to drive to college.     Baseline  Patient has not been driving, her family takes her to school and drops her off.     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     Baseline  Continued difficulty coordinating the use of both hands to complete task, speed still an issue 11/23/17    Status  Partially Met      OT LONG TERM GOAL  #15   TITLE  Patient will complete meal preparation with minimal assist.     Baseline  moderate assist 11/23/17    Time  12    Period  Weeks    Status  On-going            Plan - 12/14/17 1652    Clinical Impression Statement  Patient focused on managing bra this date with 2 different methods depending on the bra, all with back closures.  Patient able to demonstrate donning and will need to bring in non stretch bra to see how she does with one that is newer and not stretched out.  She was able to thread a needle today with minimal difficulty and demonstrated how to sew a button on clothing with min assist. She continues to work towards fine motor skills for improved performance in daily tasks.     Occupational Profile and client history currently impacting functional performance  decreased hand  strength, balance and coordination.    Occupational performance deficits (Please refer to evaluation for details):  ADL's;IADL's;Leisure;Education    Rehab Potential  Good    OT Frequency  1x / week    OT Duration  12 weeks    OT Treatment/Interventions  Self-care/ADL training;DME and/or AE instruction;Therapeutic activities;Therapeutic exercise;Neuromuscular education;Patient/family education    Plan  Occupational Therapy 1 x per week for ADL and strenghtening.    Consulted and Agree with Plan of Care  Patient       Patient will benefit from skilled therapeutic intervention  in order to improve the following deficits and impairments:  Impaired UE functional use, Difficulty walking, Decreased strength, Decreased coordination, Decreased endurance, Decreased balance  Visit Diagnosis: Muscle weakness (generalized)  Other lack of coordination    Problem List There are no active problems to display for this patient.  Achilles Dunk, OTR/L, CLT  Kishawn Pickar 12/14/2017, 4:55 PM  [place] MAIN [facility] SERVICES [address] [place], [place], [address] Phone: [phone]   Fax:  [phone]  Name: Michelle Randall MRN: [id number] Date of Birth: 1996/04/14

## 2017-12-20 ENCOUNTER — Ambulatory Visit: Payer: Medicaid Other | Admitting: Physical Therapy

## 2017-12-20 ENCOUNTER — Encounter: Payer: Self-pay | Admitting: Physical Therapy

## 2017-12-20 ENCOUNTER — Ambulatory Visit: Payer: Medicaid Other | Attending: Student | Admitting: Occupational Therapy

## 2017-12-20 ENCOUNTER — Encounter: Payer: Self-pay | Admitting: Occupational Therapy

## 2017-12-20 DIAGNOSIS — M6281 Muscle weakness (generalized): Secondary | ICD-10-CM

## 2017-12-20 DIAGNOSIS — R262 Difficulty in walking, not elsewhere classified: Secondary | ICD-10-CM | POA: Insufficient documentation

## 2017-12-20 DIAGNOSIS — R279 Unspecified lack of coordination: Secondary | ICD-10-CM

## 2017-12-20 DIAGNOSIS — R278 Other lack of coordination: Secondary | ICD-10-CM

## 2017-12-20 NOTE — Therapy (Signed)
[place] MAIN [facility] SERVICES [address] [place], [place], [address] Phone: [phone]   Fax:  [phone]  Physical Therapy Treatment  Patient Details  Name: Michelle Randall MRN: [id number] Date of Birth: 1996/05/30 Referring Provider: Langley Gauss MD   Encounter Date: 12/20/2017  PT End of Session - 12/20/17 1753    Visit Number  117    Number of Visits  134    Date for PT Re-Evaluation  01/03/18    Authorization Type  9/12    Authorization Time Period  12 visits 12/11-01/16/18     PT Start Time  1115    PT Stop Time  1155    PT Time Calculation (min)  40 min    Equipment Utilized During Treatment  Gait belt    Activity Tolerance  Patient tolerated treatment well;Patient limited by fatigue    Behavior During Therapy  [facility] for tasks assessed/performed       History reviewed. No pertinent past medical history.  History reviewed. No pertinent surgical history.  There were no vitals filed for this visit.  Subjective Assessment - 12/20/17 1753    Subjective  Patient is doing well today and has no pain. She has no new concerns.    Limitations  Walking    Patient Stated Goals  Patient wants to improve her core strength.     Currently in Pain?  No/denies    Pain Score  0-No pain    Pain Onset  In the past 7 days    Multiple Pain Sites  No       Treatment: Standing side stepping with YTB  X 10 feet x 2 Backwards amb with YTB x 10  Ft x 2 Prone knee flex stetch x 30 sec x 3 6 MW, 10 MW, 5 x sit to stand completed and goals reviewed Patient needs cues for speed of movement and posture and keeping her trunk straight during exercises.                         PT Education - 12/20/17 1753    Education provided  Yes    Education Details  stretching for HEP    Person(s) Educated  Patient    Methods  Explanation;Tactile cues    Comprehension  Verbalized understanding;Returned demonstration;Verbal cues  required          PT Long Term Goals - 12/20/17 1756      PT LONG TERM GOAL #1   Title  Patient will improve Dynamic Gait Index (DGI) score to > 21/24 for meaningful improvement and low falls risk regarding dynamic walking tasks (revised from > 19/24 )    Baseline  14/24; 11/02/16: 15/24 01/28/17: 18/24 6/5: 19/24; 08/23/17 14/24,  14/24 10/11/17 14/24 12/20/17    Time  12    Period  Weeks    Status  On-going    Target Date  01/03/18      PT LONG TERM GOAL #2   Title  Patient will increase Berg Balance score by >51/56 points to be considered a low risk for falls for improved safety. (revised from >6 points improvement)    Baseline  11/02/16: 34/56 01/28/17: 39/56 6/5: 45/56; 08/23/2017 = 45/56, 43/56 10/11/17, 43/56 12/20/17    Time  12    Period  Weeks    Status  On-going    Target Date  01/03/18      PT LONG TERM GOAL #  3   Title  Patient will be able to transfer in and out of a large car with  a high seat with CGA  to improve ability to go to school/doctor visits.     Baseline  Patient requires min A for transfer into large SUV unless it has a step rail on the side    Time  12    Period  Weeks    Status  On-going    Target Date  01/03/18      PT LONG TERM GOAL #4   Title  Patient will complete a TUG test in < 12 seconds for independent mobility and decreased fall risk     Baseline  11.45; 08/23/2017 = 13.66 sec, 13.40 12/20/17    Time  12    Period  Weeks    Status  On-going    Target Date  01/03/18      PT LONG TERM GOAL #5   Title  Patient will improve 6 minute walk distance by > 150 ft for improved return to functional community activities     Baseline  740 01/28/2017: 763f 6/5: 795  730 on 07/19/17; 790 ft on 08/23/18,  740 feet, 730 feet 12/20/17    Time  12    Period  Weeks    Status  On-going      PT LONG TERM GOAL #6   Title  Patient will improve gait speed to > 1.2 m/s with least restrictive assistive device to return to normal walking speed     Baseline  .76 m/h  01/28/2017: .87  07/19/17 0.620m; 0.77 m/s on 08/23/17, . 58 m/sec 10/11/17    Time  12    Period  Weeks    Status  On-going 01/03/18 target dat      PT LONG TERM GOAL #7   Title  Patient (< [age]ears old) will complete five times sit to stand test in < 10 seconds indicating an increased LE strength and improved balance     Baseline  9.45 sec; 9.63 on 08/23/17, 9.63 sec    Time  12    Period  Weeks    Status  Achieved      PT LONG TERM GOAL #8   Title  Patient will be able to ambulate on inclines and grass independenlty with LRAD    Baseline  Can ambulate over grassy inclines with decreased speed and safety requiring need loftstrand curtches and SBA    Time  12    Period  Weeks    Status  On-going 01/03/18 target date      PT LONG TERM GOAL  #9   TITLE  Patient will be abe to transfer from low chair or stool without UE support independently    Baseline  Patient needs min A assist to transfer from a low stool    Time  12    Period  Weeks    Status  Partially Met 10/11/17 target date      PT LONG TERM GOAL  #10   TITLE  Patient will be able to transfer from the floor to standing independently with use of LRAD    Baseline  Patient able to stand from the floor independently if she has not been on the floor longer than about 5 min due to stiffness with prolonged positioning    Time  12    Period  Weeks    Status  On-going 01/03/18 target date  PT LONG TERM GOAL  #11   TITLE  Patient will step up onto a 6" step 5x with AD independently to increase community mobility    Baseline  Pt. has increased difficulty requiring supervision for safety, but no physical assist    Time  12    Period  Weeks    Status  On-going 01/03/18 target date            Plan - 12/20/17 1755    Clinical Impression Statement  Pt requires redirection and verbal cues for correct performance of exercises. Patient demonstrates LOB with standing balance exercises indicating decreased balancing strategies. Pt was  able to progress standing exercises today..  Pt was able to perform all exercises on even surfaces with CGA  assist. Patient struggles with speed during movement as well as balance with unstable surfaces. Pt encouraged to continue HEP .Follow-up as scheduled.    Rehab Potential  Good    Clinical Impairments Affecting Rehab Potential  weakness and decreased standing balance    PT Frequency  1x / week    PT Duration  12 weeks    PT Treatment/Interventions  Therapeutic exercise;Therapeutic activities;Gait training;Balance training;Stair training;DME Instruction;Neuromuscular re-education;Patient/family education    PT Next Visit Plan  Continue POC    PT Home Exercise Plan  added seated dynamic core UE and LE lift offs with visual cues using mirror, supine marches    Consulted and Agree with Plan of Care  Patient       Patient will benefit from skilled therapeutic intervention in order to improve the following deficits and impairments:  Abnormal gait, Decreased balance, Decreased endurance, Difficulty walking, Decreased strength  Visit Diagnosis: Muscle weakness (generalized)  Other lack of coordination  Difficulty in walking, not elsewhere classified  Lack of coordination     Problem List There are no active problems to display for this patient.   [address], [place] DPT 12/20/2017, 6:00 PM  [place] MAIN [facility] SERVICES [address] [place], [place], [address] Phone: [phone]   Fax:  [phone]  Name: Michelle Randall MRN: [id number] Date of Birth: 1996-08-24

## 2017-12-21 NOTE — Therapy (Signed)
[place] MAIN [facility] SERVICES [address] [place], [place], [address] Phone: [phone]   Fax:  [phone]  Occupational Therapy Treatment  Patient Details  Name: Michelle Randall MRN: [id number] Date of Birth: 1995-11-23 Referring Provider: Langley Gauss MD   Encounter Date: 12/20/2017  OT End of Session - 12/20/17 1218    Visit Number  109    Number of Visits  130    Date for OT Re-Evaluation  02/17/18    OT Start Time  1200    OT Stop Time  1246    OT Time Calculation (min)  46 min       History reviewed. No pertinent past medical history.  History reviewed. No pertinent surgical history.  There were no vitals filed for this visit.  Subjective Assessment - 12/20/17 1217    Subjective   Patient reports she is doing well, back to classes this week at [employer].  She reports they are wanting to re do her bathroom at home to make it more accessible for her.     Patient Stated Goals  To be as independent as possible at home and at school.     Currently in Pain?  No/denies    Pain Score  0-No pain                   OT Treatments/Exercises (OP) - 12/20/17 1607      Neurological Re-education Exercises   Other Exercises 1  Patient seen for green resistive putty for grip strengthening in each hand, lateral pinch, 3 point and 2 point pinch with cues for prehension patterns and technique. Patient seen for manipulation of small push pins to place in moderate resistance board with left hand with cues for prehension patterns.  Assistance to push the pins in all the way.         Fine Motor Coordination (Hand/Wrist)   Other Fine Motor Exercises  Patient seen for speed and coordination with tennis ball with L hand for bounce/catch, toss/catch, hand to hand toss and combination of the above motions.  Completed multiple trials of each exercise with cues for technique, occasional dropping of ball with assist to recover it. Manipulation of  coins from tabletop flat surface with right and left hand, picking up, moving through the hand, using the hand for storage and moving coins back to fingertips to place in resistive bank for additional strengthening. Manipulation of small perler beads to place into grid and then remove with tweezers with cues.              OT Education - 12/20/17 1217    Education provided  Yes    Education Details  resistive pinch    Person(s) Educated  Patient    Methods  Explanation;Demonstration;Verbal cues    Comprehension  Verbal cues required;Returned demonstration;Verbalized understanding          OT Long Term Goals - 11/23/17 0935      OT LONG TERM GOAL #1   Title  Patient will demonstrate increased strength in right hand sufficient to scoop ice cream from frozen container with modified independence.    Baseline  goal update:  patient still able to complete with minimal assist at times.    Time  12    Period  Months    Status  On-going      OT LONG TERM GOAL #2   Title  Patient will be able to make her bed with  modified independence.    Baseline  she continues to have difficulty with balance and completing this task, increased time, but no assistance needed. Would like to work towards efficiency in time    Time  6    Period  Months    Status  On-going      OT LONG TERM GOAL #3   Title  Patient will complete back closure bra donning with modified independence.     Baseline  max assist at eval    Time  12    Period  Weeks    Status  New    Target Date  02/17/18      OT LONG TERM GOAL #4   Title  Patient will increase BUE strength by 1 mm grade to be able to self propel her wheelchair over carpet for distances greater than 300 feet    Baseline  short distance mobility over carpet about 100 feet before fatigue.     Time  12    Period  Weeks    Status  New    Target Date  02/17/18      OT LONG TERM GOAL #5   Title  Patient will demonstrate the ability to perform small buttons  on shirts independently and with good speed in order to dress for school.    Baseline  goal update: Patient can perform with increased time but occasionally needs assist if rushed, still difficult at goal update 11/23/2017    Time  12    Period  Weeks    Status  Partially Met      OT LONG TERM GOAL #6   Title  Patient will donn and doff small earrings with modified independence.    Baseline  Goal update:  still able to hold small backs and dropping only 20% of the time, slow to complete but improving     Time  12    Period  Weeks    Status  Partially Met      OT LONG TERM GOAL #7   Title  Patient will be able to zip her rainboots while they are on her feet with modified independence.      Baseline  patient unable to fit into current boots secondary to new AFOs    Time  12    Period  Weeks    Status  Deferred      OT LONG TERM GOAL #8   Title  Patient to complete am self care demonstrating efficient methods of dressing skills to get to school on time.     Baseline  Goal update:  patient reports it takes about an hour to get dressed, running late one time this week.  Still working on efficiency and speed of tasks    Time  12    Period  Weeks    Status  Partially Met      OT LONG TERM GOAL  #9   Baseline  Patient will don/doff socks and AFO braces with modified independence using adaptive equipment as needed.  Socks and braces are more difficult after showering and if socks are new, otherwise she can do it.     Time  12    Period  Weeks    Status  Partially Met      OT LONG TERM GOAL  #10   TITLE  Patient will demonstrate the ability to pull jeans down over AFOs with modified independence.     Baseline  continues to require min  assist with this task 11/23/17    Time  12    Period  Weeks    Status  On-going      OT LONG TERM GOAL  #11   TITLE  Will be able to squeeze tooth paste out of the tube when it is less than half full.    Baseline  difficulty with small travel sized, may look  into alternative ways since this has been a chronic issue.    Time  6    Period  Months    Status  Achieved      OT LONG TERM GOAL  #12   TITLE  Patient will demonstrate managing wheelchair rain poncho in inclement weather with modified independence.    Baseline  crutch tips are difficult for anyone to take them off, goal deferred.    Time  6    Period  Months    Status  Achieved      OT LONG TERM GOAL  #13   TITLE  Patient will be able to complete 2 1/2 minutes straight with left shoulder flexion simulating driving with a stearing wheel ball to be able to drive to college.     Baseline  Patient has not been driving, her family takes her to school and drops her off.     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     Baseline  Continued difficulty coordinating the use of both hands to complete task, speed still an issue 11/23/17    Status  Partially Met      OT LONG TERM GOAL  #15   TITLE  Patient will complete meal preparation with minimal assist.     Baseline  moderate assist 11/23/17    Time  12    Period  Weeks    Status  On-going            Plan - 12/20/17 1218    Clinical Impression Statement  Patient returned to college this week.  She continues to work towards speed and dexterity with manipulation of objects as well as strength in [place] to help with opening doors at school, performing self care and tasks at home.  Continue to work towards improving bilateral hand function for necessary daily tasks.     Occupational Profile and client history currently impacting functional performance  decreased hand strength, balance and coordination.    Occupational performance deficits (Please refer to evaluation for details):  ADL's;IADL's;Leisure;Education    Rehab Potential  Good    OT Frequency  1x /  week    OT Duration  12 weeks    OT Treatment/Interventions  Self-care/ADL training;DME and/or AE instruction;Therapeutic activities;Therapeutic exercise;Neuromuscular education;Patient/family education    Consulted and Agree with Plan of Care  Patient       Patient will benefit from skilled therapeutic intervention in order to improve the following deficits and impairments:  Impaired UE functional use, Difficulty walking, Decreased strength, Decreased coordination, Decreased endurance, Decreased balance  Visit Diagnosis: Muscle weakness (generalized)  Other lack of coordination    Problem List There are no active problems to display for this patient.  Wahneta Derocher T Tomasita Morrow, OTR/L, CLT  Synai Prettyman 12/21/2017, 4:13 PM  [place]  [facility] MAIN [facility] SERVICES [place], [place], [address] Phone: [phone]   Fax:  [phone]  Name: Michelle Randall MRN: [id number] Date of Birth: 28-Mar-1996

## 2017-12-27 ENCOUNTER — Ambulatory Visit: Payer: Medicaid Other | Admitting: Physical Therapy

## 2017-12-27 ENCOUNTER — Encounter: Payer: Self-pay | Admitting: Physical Therapy

## 2017-12-27 ENCOUNTER — Ambulatory Visit: Payer: Medicaid Other | Admitting: Occupational Therapy

## 2017-12-27 ENCOUNTER — Encounter: Payer: Self-pay | Admitting: Occupational Therapy

## 2017-12-27 DIAGNOSIS — R279 Unspecified lack of coordination: Secondary | ICD-10-CM

## 2017-12-27 DIAGNOSIS — M6281 Muscle weakness (generalized): Secondary | ICD-10-CM | POA: Diagnosis not present

## 2017-12-27 DIAGNOSIS — R278 Other lack of coordination: Secondary | ICD-10-CM

## 2017-12-27 DIAGNOSIS — R262 Difficulty in walking, not elsewhere classified: Secondary | ICD-10-CM

## 2017-12-27 NOTE — Therapy (Addendum)
[place] MAIN [facility] SERVICES [address] [place], [place], [address] Phone: [phone]   Fax:  [phone]  Physical Therapy Treatment  Patient Details  Name: Michelle Randall MRN: [id number] Date of Birth: 1996/06/27 Referring Provider: Langley Gauss MD   Encounter Date: 12/27/2017  PT End of Session - 12/27/17 1114    Visit Number  118    Number of Visits  134    Date for PT Re-Evaluation  01/03/18    Authorization Type  10/12    Authorization Time Period  12 visits 12/11-01/16/18     PT Start Time  1115    PT Stop Time  1200    PT Time Calculation (min)  45 min    Equipment Utilized During Treatment  Gait belt    Activity Tolerance  Patient tolerated treatment well;Patient limited by fatigue    Behavior During Therapy  [facility] for tasks assessed/performed       History reviewed. No pertinent past medical history.  History reviewed. No pertinent surgical history.  There were no vitals filed for this visit.  Subjective Assessment - 12/27/17 1113    Subjective  Patient is doing well today and has no pain. She has no new concerns.    Limitations  Walking    Patient Stated Goals  Patient wants to improve her core strength.     Currently in Pain?  No/denies    Pain Score  0-No pain    Multiple Pain Sites  No       Outcome measures were completed and goals reviewed  Patient performed sit to stands on foam from low surface with CGA and LOB 25% of the time.                       PT Education - 12/27/17 1114    Education Details  goals     Person(s) Educated  Patient    Methods  Explanation    Comprehension  Verbalized understanding          PT Long Term Goals - 12/27/17 1115      PT LONG TERM GOAL #1   Title  Patient will improve Dynamic Gait Index (DGI) score to > 21/24 for meaningful improvement and low falls risk regarding dynamic walking tasks (revised from > 19/24 )    Baseline  14/24;  11/02/16: 15/24 01/28/17: 18/24 6/5: 19/24; 08/23/17 14/24,  14/24 10/11/17 14/24 12/20/17, 12/27/17/  14/24    Time  12    Period  Weeks    Status  On-going    Target Date  03/21/18      PT LONG TERM GOAL #2   Title  Patient will increase Berg Balance score by >51/56 points to be considered a low risk for falls for improved safety. (revised from >6 points improvement)    Baseline  11/02/16: 34/56 01/28/17: 39/56 6/5: 45/56; 08/23/2017 = 45/56, 43/56 10/11/17, 43/56 12/20/17, 12/27/17  43/56    Time  12    Period  Weeks    Status  On-going    Target Date  03/21/18      PT LONG TERM GOAL #3   Title  Patient will be able to transfer in and out of a large car with  a high seat with CGA  to improve ability to go to school/doctor visits.     Baseline  Patient requires min A for transfer into large SUV unless it has a  step rail on the side    Time  12    Period  Weeks    Status  On-going    Target Date  03/21/18      PT LONG TERM GOAL #4   Title  Patient will complete a TUG test in < 12 seconds for independent mobility and decreased fall risk     Baseline  11.45; 08/23/2017 = 13.66 sec, 13.40 12/20/17, 12/27/17 12.07 sec    Time  12    Period  Weeks    Status  On-going      PT LONG TERM GOAL #5   Title  Patient will improve 6 minute walk distance by > 150 ft for improved return to functional community activities     Baseline  740 01/28/2017: 759f 6/5: 795  730 on 07/19/17; 790 ft on 08/23/18,  740 feet, 730 feet 12/20/17, 12/27/17 650 feet     Time  12    Period  Weeks    Status  On-going    Target Date  03/21/18      PT LONG TERM GOAL #6   Title  Patient will improve gait speed to > 1.2 m/s with least restrictive assistive device to return to normal walking speed     Baseline  .76 m/h 01/28/2017: .87  07/19/17 0.631m; 0.77 m/s on 08/23/17, . 58 m/sec 10/11/17 .6675mc    Time  12    Period  Weeks    Status  On-going 03/21/18 target dat      PT LONG TERM GOAL #7   Title  Patient (< 60 56ars old) will  complete five times sit to stand test in < 10 seconds indicating an increased LE strength and improved balance     Baseline  9.45 sec; 9.63 on 08/23/17, 9.63 sec 12/27/17, 10.07 sec    Time  12    Period  Weeks    Status  Achieved 03/26/18 target date      [place]   Title  Patient will be able to ambulate on inclines and grass independenlty with LRAD    Baseline  Can ambulate over grassy inclines with decreased speed and safety requiring need loftstrand curtches and SBA    Time  12    Period  Weeks    Status  On-going 03/21/18 target date      PT LONG TERM GOAL  #9   TITLE  Patient will be abe to transfer from low chair or stool without UE support independently    Baseline  Patient needs min A assist to transfer from a low stool    Time  12    Period  Weeks    Status  Partially Met 03/21/18 target date      PT LONG TERM GOAL  #10   TITLE  Patient will be able to transfer from the floor to standing independently with use of LRAD    Baseline  Patient able to stand from the floor independently if she has not been on the floor longer than about 5 min due to stiffness with prolonged positioning    Time  12    Period  Weeks    Status  On-going 03/21/18 target date      PT LONG TERM GOAL  #11   TITLE  Patient will step up onto a 6" step 5x with AD independently to increase community mobility    Baseline  Pt. has increased difficulty requiring supervision for safety,  but no physical assist    Time  12    Period  Weeks    Status  On-going 01/03/18 target date              Patient will benefit from skilled therapeutic intervention in order to improve the following deficits and impairments:     Visit Diagnosis: Muscle weakness (generalized)  Other lack of coordination  Difficulty in walking, not elsewhere classified  Lack of coordination     Problem List There are no active problems to display for this patient.   [address] [place], [place] DPT 12/27/2017, 12:08  PM  [place] MAIN [facility] SERVICES [address] [place], [place], [address] Phone: [phone]   Fax:  [phone]  Name: SHARAE ZAPPULLA MRN: [id number] Date of Birth: September 08, 1996

## 2017-12-27 NOTE — Therapy (Signed)
[place] MAIN [facility] SERVICES [address] [place], [place], [address] Phone: [phone]   Fax:  [phone]  Occupational Therapy Treatment  Patient Details  Name: Michelle Randall MRN: [id number] Date of Birth: 04-12-1996 Referring Provider: Langley Gauss MD   Encounter Date: 12/27/2017  OT End of Session - 12/27/17 1039    Visit Number  110    Number of Visits  130    Date for OT Re-Evaluation  02/17/18    OT Start Time  1030    OT Stop Time  1115    OT Time Calculation (min)  45 min    Activity Tolerance  Patient tolerated treatment well    Behavior During Therapy  [facility] for tasks assessed/performed       History reviewed. No pertinent past medical history.  History reviewed. No pertinent surgical history.  There were no vitals filed for this visit.  Subjective Assessment - 12/27/17 1035    Subjective   Pt. reports her classes are more challenging to get to, and from.    Patient is accompained by:  Family member    Patient Stated Goals  To be as independent as possible at home and at school.     Currently in Pain?  No/denies      OT TREATMENT    Neuro muscular re-education:  Pt. worked on tasks to sustain lateral pinch on resistive tweezers while grasping and moving 2" toothpick sticks from a horizontal flat position to a vertical position in order to place it in the holder. Pt. was able to sustain grasp while positioning and extending the wrist/hand in the necessary alignment needed to place the stick through the top of the holder.  Pt. worked on grasping with the needle nose tweezers for 1/4" pegs. Pt. worked on [facility] using [place]. Pt. worked on reaching up, and putting multiple sheets of paper on the bulletin board to increase the resistance. Pt.worked on manipulating pushpins onto the bulletin board when arms are elevated.                           OT Education - 12/27/17 1039    Education  provided  Yes    Education Details  [facility]    Person(s) Educated  Patient    Methods  Explanation;Tactile cues    Comprehension  Verbalized understanding;Returned demonstration;Verbal cues required          OT Long Term Goals - 11/23/17 0935      OT LONG TERM GOAL #1   Title  Patient will demonstrate increased strength in right hand sufficient to scoop ice cream from frozen container with modified independence.    Baseline  goal update:  patient still able to complete with minimal assist at times.    Time  12    Period  Months    Status  On-going      OT LONG TERM GOAL #2   Title  Patient will be able to make her bed with modified independence.    Baseline  she continues to have difficulty with balance and completing this task, increased time, but no assistance needed. Would like to work towards efficiency in time    Time  6    Period  Months    Status  On-going      OT LONG TERM GOAL #3   Title  Patient will complete back closure bra donning with modified independence.  Baseline  max assist at eval    Time  12    Period  Weeks    Status  New    Target Date  02/17/18      OT LONG TERM GOAL #4   Title  Patient will increase BUE strength by 1 mm grade to be able to self propel her wheelchair over carpet for distances greater than 300 feet    Baseline  short distance mobility over carpet about 100 feet before fatigue.     Time  12    Period  Weeks    Status  New    Target Date  02/17/18      OT LONG TERM GOAL #5   Title  Patient will demonstrate the ability to perform small buttons on shirts independently and with good speed in order to dress for school.    Baseline  goal update: Patient can perform with increased time but occasionally needs assist if rushed, still difficult at goal update 11/23/2017    Time  12    Period  Weeks    Status  Partially Met      OT LONG TERM GOAL #6   Title  Patient will donn and doff small earrings with modified independence.    Baseline   Goal update:  still able to hold small backs and dropping only 20% of the time, slow to complete but improving     Time  12    Period  Weeks    Status  Partially Met      OT LONG TERM GOAL #7   Title  Patient will be able to zip her rainboots while they are on her feet with modified independence.      Baseline  patient unable to fit into current boots secondary to new AFOs    Time  12    Period  Weeks    Status  Deferred      OT LONG TERM GOAL #8   Title  Patient to complete am self care demonstrating efficient methods of dressing skills to get to school on time.     Baseline  Goal update:  patient reports it takes about an hour to get dressed, running late one time this week.  Still working on efficiency and speed of tasks    Time  12    Period  Weeks    Status  Partially Met      OT LONG TERM GOAL  #9   Baseline  Patient will don/doff socks and AFO braces with modified independence using adaptive equipment as needed.  Socks and braces are more difficult after showering and if socks are new, otherwise she can do it.     Time  12    Period  Weeks    Status  Partially Met      OT LONG TERM GOAL  #10   TITLE  Patient will demonstrate the ability to pull jeans down over AFOs with modified independence.     Baseline  continues to require min assist with this task 11/23/17    Time  12    Period  Weeks    Status  On-going      OT LONG TERM GOAL  #11   TITLE  Will be able to squeeze tooth paste out of the tube when it is less than half full.    Baseline  difficulty with small travel sized, may look into alternative ways since this has been a chronic issue.  Time  6    Period  Months    Status  Achieved      OT LONG TERM GOAL  #12   TITLE  Patient will demonstrate managing wheelchair rain poncho in inclement weather with modified independence.    Baseline  crutch tips are difficult for anyone to take them off, goal deferred.    Time  6    Period  Months    Status  Achieved       OT LONG TERM GOAL  #13   TITLE  Patient will be able to complete 2 1/2 minutes straight with left shoulder flexion simulating driving with a stearing wheel ball to be able to drive to college.     Baseline  Patient has not been driving, her family takes her to school and drops her off.     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     Baseline  Continued difficulty coordinating the use of both hands to complete task, speed still an issue 11/23/17    Status  Partially Met      OT LONG TERM GOAL  #15   TITLE  Patient will complete meal preparation with minimal assist.     Baseline  moderate assist 11/23/17    Time  12    Period  Weeks    Status  On-going            Plan - 12/27/17 1048    Occupational Profile and client history currently impacting functional performance  decreased hand strength, balance and coordination.    Occupational performance deficits (Please refer to evaluation for details):  ADL's;IADL's;Leisure;Education    Rehab Potential  Good    OT Frequency  1x / week    OT Duration  12 weeks    OT Treatment/Interventions  Self-care/ADL training;DME and/or AE instruction;Therapeutic activities;Therapeutic exercise;Neuromuscular education;Patient/family education    Plan  Occupational Therapy 1 x per week for ADL and strenghtening.    OT Home Exercise Plan  try elastic shoe laces and new tech for bra donning and doffing    Consulted and Agree with Plan of Care  Patient    Family Member Consulted  mom       Patient will benefit from skilled therapeutic intervention in order to improve the following deficits and impairments:  Impaired UE functional use, Difficulty walking, Decreased strength, Decreased coordination, Decreased endurance, Decreased balance  Visit Diagnosis: Muscle weakness  (generalized)  Other lack of coordination    Problem List There are no active problems to display for this patient.   Harrel Carina, MS, OTR/L 12/27/2017, 10:51 AM  [place] MAIN [facility] SERVICES [address] [place], [place], [address] Phone: [phone]   Fax:  [phone]  Name: Michelle Randall MRN: [id number] Date of Birth: 04/22/1996

## 2018-01-03 ENCOUNTER — Ambulatory Visit: Payer: Medicaid Other | Admitting: Physical Therapy

## 2018-01-03 ENCOUNTER — Ambulatory Visit: Payer: Medicaid Other | Admitting: Occupational Therapy

## 2018-01-03 ENCOUNTER — Encounter: Payer: Self-pay | Admitting: Occupational Therapy

## 2018-01-03 ENCOUNTER — Encounter: Payer: Self-pay | Admitting: Physical Therapy

## 2018-01-03 DIAGNOSIS — R279 Unspecified lack of coordination: Secondary | ICD-10-CM

## 2018-01-03 DIAGNOSIS — M6281 Muscle weakness (generalized): Secondary | ICD-10-CM

## 2018-01-03 DIAGNOSIS — R262 Difficulty in walking, not elsewhere classified: Secondary | ICD-10-CM

## 2018-01-03 DIAGNOSIS — R278 Other lack of coordination: Secondary | ICD-10-CM

## 2018-01-03 NOTE — Therapy (Addendum)
[place] [facility] MAIN REHAB SERVICES [address] [place], [place], [address] Phone: [phone]   Fax:  [phone]  Physical Therapy Treatment  Patient Details  Name: Michelle Randall MRN: [id number] Date of Birth: 09/04/1996 Referring Provider: Sator Nogo, Jasna MD   Encounter Date: 01/03/2018  PT End of Session - 01/03/18 1131    Visit Number  118    Number of Visits  134    Date for PT Re-Evaluation  03/28/18    Authorization Type  11/12    Authorization Time Period  12 visits 12/11-01/16/18     PT Start Time  1100    PT Stop Time  1145    PT Time Calculation (min)  45 min    Equipment Utilized During Treatment  Gait belt    Activity Tolerance  Patient tolerated treatment well;Patient limited by fatigue    Behavior During Therapy  WFL for tasks assessed/performed       History reviewed. No pertinent past medical history.  History reviewed. No pertinent surgical history.  There were no vitals filed for this visit.  Subjective Assessment - 01/03/18 1130    Subjective  Patient is doing well today and has no pain. She has no new concerns.    Patient is accompained by:  Family member    Limitations  Walking    Patient Stated Goals  Patient wants to improve her core strength.     Currently in Pain?  No/denies    Pain Score  0-No pain    Pain Onset  In the past 7 days      Treatment: Planks side ways, planks supine, planks prone x 30 sec x 3   Tall kneeling with theraball and rolling fwd/ bwd to engage core muscles x 15 each direction  Quadriped with alternating UE and alternating LE with assist x 10 with 5 sec hold  Prone hip flex stretch BLE with 30 sec hold x 3  Patient needs cues for                        PT Education - 01/03/18 1131    Education provided  Yes    Education Details  strengthening core    Person(s) Educated  Patient    Methods  Explanation    Comprehension  Verbalized  understanding;Returned demonstration          PT Long Term Goals - 12/27/17 1115      PT LONG TERM GOAL #1   Title  Patient will improve Dynamic Gait Index (DGI) score to > 21/24 for meaningful improvement and low falls risk regarding dynamic walking tasks (revised from > 19/24 )  (Pended)     Baseline  14/24; 11/02/16: 15/24 01/28/17: 18/24 6/5: 19/24; 08/23/17 14/24,  14/24 10/11/17 14/24 12/20/17, 12/27/17/  14/24  (Pended)     Time  12  (Pended)     Period  Weeks  (Pended)     Status  On-going  (Pended)     Target Date  03/21/18  (Pended)       PT LONG TERM GOAL #2   Title  Patient will increase Berg Balance score by >51/56 points to be considered a low risk for falls for improved safety. (revised from >6 points improvement)  (Pended)     Baseline  11/02/16: 34/56 01/28/17: 39/56 6/5: 45/56; 08/23/2017 = 45/56, 43/56 10/11/17, 43/56 12/20/17, 12/27/17  43/56  (Pended)     Time    12  (Pended)     Period  Weeks  (Pended)     Status  On-going  (Pended)     Target Date  03/21/18  (Pended)       PT LONG TERM GOAL #3   Title  Patient will be able to transfer in and out of a large car with  a high seat with CGA  to improve ability to go to school/doctor visits.   (Pended)     Baseline  Patient requires min A for transfer into large SUV unless it has a step rail on the side  (Pended)     Time  12  (Pended)     Period  Weeks  (Pended)     Status  On-going  (Pended)     Target Date  03/21/18  (Pended)       PT LONG TERM GOAL #4   Title  Patient will complete a TUG test in < 12 seconds for independent mobility and decreased fall risk   (Pended)     Baseline  11.45; 08/23/2017 = 13.66 sec, 13.40 12/20/17, 12/27/17 12.07 sec  (Pended)     Time  12  (Pended)     Period  Weeks  (Pended)     Status  On-going  (Pended)       PT LONG TERM GOAL #5   Title  Patient will improve 6 minute walk distance by > 150 ft for improved return to functional community activities   (Pended)     Baseline  740 01/28/2017:  790ft 6/5: 795  730 on 07/19/17; 790 ft on 08/23/18,  740 feet, 730 feet 12/20/17, 12/27/17 650 feet   (Pended)     Time  12  (Pended)     Period  Weeks  (Pended)     Status  On-going  (Pended)     Target Date  03/21/18  (Pended)       PT LONG TERM GOAL #6   Title  Patient will improve gait speed to > 1.2 m/s with least restrictive assistive device to return to normal walking speed   (Pended)     Baseline  .76 m/h 01/28/2017: .87  07/19/17 0.69m/s; 0.77 m/s on 08/23/17, . 58 m/sec 10/11/17 .66m/sec  (Pended)     Time  12  (Pended)     Period  Weeks  (Pended)     Status  On-going  (Pended)  03/21/18 target dat      PT LONG TERM GOAL #7   Title  Patient (< 60 years old) will complete five times sit to stand test in < 10 seconds indicating an increased LE strength and improved balance   (Pended)     Baseline  9.45 sec; 9.63 on 08/23/17, 9.63 sec 12/27/17, 10.07 sec  (Pended)     Time  12  (Pended)     Period  Weeks  (Pended)     Status  Achieved  (Pended)  03/26/18 target date      PT LONG TERM GOAL #8   Title  Patient will be able to ambulate on inclines and grass independenlty with LRAD  (Pended)     Baseline  Can ambulate over grassy inclines with decreased speed and safety requiring need loftstrand curtches and SBA  (Pended)     Time  12  (Pended)     Period  Weeks  (Pended)     Status  On-going  (Pended)  03/21/18 target date      PT   LONG TERM GOAL  #9   TITLE  Patient will be abe to transfer from low chair or stool without UE support independently  (Pended)     Baseline  Patient needs min A assist to transfer from a low stool  (Pended)     Time  12  (Pended)     Period  Weeks  (Pended)     Status  Partially Met  (Pended)  03/21/18 target date      PT LONG TERM GOAL  #10   TITLE  Patient will be able to transfer from the floor to standing independently with use of LRAD  (Pended)     Baseline  Patient able to stand from the floor independently if she has not been on the floor longer than about 5  min due to stiffness with prolonged positioning  (Pended)     Time  12  (Pended)     Period  Weeks  (Pended)     Status  On-going  (Pended)  03/21/18 target date      PT LONG TERM GOAL  #11   TITLE  Patient will step up onto a 6" step 5x with AD independently to increase community mobility  (Pended)     Baseline  Pt. has increased difficulty requiring supervision for safety, but no physical assist  (Pended)     Time  12  (Pended)     Period  Weeks  (Pended)     Status  On-going  (Pended)  01/03/18 target date            Plan - 01/03/18 1134    Clinical Impression Statement  Patient performs trunk, core and LE exercises with short rest periods to improve strength and mobiltiy. Pt was able to perform all exercises on uneven surfaces with min assist. Patient struggles with speed during movement as well as balance with unstable surfaces. She has fatigue with quadriped exercises and tall kneeling exercises. She will continue to benefit from skilled PT to improve balance and strength    Rehab Potential  Good    Clinical Impairments Affecting Rehab Potential  weakness and decreased standing balance    PT Frequency  1x / week    PT Duration  12 weeks    PT Treatment/Interventions  Therapeutic exercise;Therapeutic activities;Gait training;Balance training;Stair training;DME Instruction;Neuromuscular re-education;Patient/family education    PT Next Visit Plan  Continue POC    PT Home Exercise Plan  added seated dynamic core UE and LE lift offs with visual cues using mirror, supine marches    Consulted and Agree with Plan of Care  Patient       Patient will benefit from skilled therapeutic intervention in order to improve the following deficits and impairments:  Abnormal gait, Decreased balance, Decreased endurance, Difficulty walking, Decreased strength  Visit Diagnosis: Muscle weakness (generalized)  Other lack of coordination  Difficulty in walking, not elsewhere classified  Lack of  coordination     Problem List There are no active problems to display for this patient.   [address], [place] DPT 01/03/2018, 2:51 PM  [place] MAIN [facility] SERVICES [address] [place], [place], [address] Phone: [phone]   Fax:  [phone]  Name: Michelle Randall MRN: [id number] Date of Birth: 14-Mar-1996

## 2018-01-03 NOTE — Therapy (Signed)
[place] MAIN [facility] SERVICES [address] [place], [place], [address] Phone: [phone]   Fax:  [phone]  Occupational Therapy Treatment  Patient Details  Name: TOMESHIA PIZZI MRN: [id number] Date of Birth: 07/20/1996 Referring Provider: Langley Gauss MD   Encounter Date: 01/03/2018    History reviewed. No pertinent past medical history.  History reviewed. No pertinent surgical history.  There were no vitals filed for this visit.  Subjective Assessment - 01/03/18 1021    Subjective   Pt. is preparing for a trip [place].    Patient is accompained by:  Family member    Patient Stated Goals  To be as independent as possible at home and at school.     Currently in Pain?  No/denies      OT TREATMENT    Neuro muscular re-education:   Pt. Worked on grasping, and placing 2", thin dowels into a soft pegboard. Pt. Worked on manipulating tweezers to remove the dowels. Pt. Worked on grasping extra small 1/16" beads, and placing them onto a soft pegboard. Pt. Worked on removing the beads using a 2 pt. Pinch. Pt. Worked on placing the 1/16" beads on an extra small dowel.                        OT Education - 01/03/18 1026    Education provided  Yes    Education Details  UE functioning, [place].    Person(s) Educated  Patient    Methods  Explanation    Comprehension  Verbalized understanding          OT Long Term Goals - 11/23/17 0935      OT LONG TERM GOAL #1   Title  Patient will demonstrate increased strength in right hand sufficient to scoop ice cream from frozen container with modified independence.    Baseline  goal update:  patient still able to complete with minimal assist at times.    Time  12    Period  Months    Status  On-going      OT LONG TERM GOAL #2   Title  Patient will be able to make her bed with modified independence.    Baseline  she continues to have difficulty with balance and  completing this task, increased time, but no assistance needed. Would like to work towards efficiency in time    Time  6    Period  Months    Status  On-going      OT LONG TERM GOAL #3   Title  Patient will complete back closure bra donning with modified independence.     Baseline  max assist at eval    Time  12    Period  Weeks    Status  New    Target Date  02/17/18      OT LONG TERM GOAL #4   Title  Patient will increase BUE strength by 1 mm grade to be able to self propel her wheelchair over carpet for distances greater than 300 feet    Baseline  short distance mobility over carpet about 100 feet before fatigue.     Time  12    Period  Weeks    Status  New    Target Date  02/17/18      OT LONG TERM GOAL #5   Title  Patient will demonstrate the ability to perform small buttons on shirts independently and with good  speed in order to dress for school.    Baseline  goal update: Patient can perform with increased time but occasionally needs assist if rushed, still difficult at goal update 11/23/2017    Time  12    Period  Weeks    Status  Partially Met      OT LONG TERM GOAL #6   Title  Patient will donn and doff small earrings with modified independence.    Baseline  Goal update:  still able to hold small backs and dropping only 20% of the time, slow to complete but improving     Time  12    Period  Weeks    Status  Partially Met      OT LONG TERM GOAL #7   Title  Patient will be able to zip her rainboots while they are on her feet with modified independence.      Baseline  patient unable to fit into current boots secondary to new AFOs    Time  12    Period  Weeks    Status  Deferred      OT LONG TERM GOAL #8   Title  Patient to complete am self care demonstrating efficient methods of dressing skills to get to school on time.     Baseline  Goal update:  patient reports it takes about an hour to get dressed, running late one time this week.  Still working on efficiency and  speed of tasks    Time  12    Period  Weeks    Status  Partially Met      OT LONG TERM GOAL  #9   Baseline  Patient will don/doff socks and AFO braces with modified independence using adaptive equipment as needed.  Socks and braces are more difficult after showering and if socks are new, otherwise she can do it.     Time  12    Period  Weeks    Status  Partially Met      OT LONG TERM GOAL  #10   TITLE  Patient will demonstrate the ability to pull jeans down over AFOs with modified independence.     Baseline  continues to require min assist with this task 11/23/17    Time  12    Period  Weeks    Status  On-going      OT LONG TERM GOAL  #11   TITLE  Will be able to squeeze tooth paste out of the tube when it is less than half full.    Baseline  difficulty with small travel sized, may look into alternative ways since this has been a chronic issue.    Time  6    Period  Months    Status  Achieved      OT LONG TERM GOAL  #12   TITLE  Patient will demonstrate managing wheelchair rain poncho in inclement weather with modified independence.    Baseline  crutch tips are difficult for anyone to take them off, goal deferred.    Time  6    Period  Months    Status  Achieved      OT LONG TERM GOAL  #13   TITLE  Patient will be able to complete 2 1/2 minutes straight with left shoulder flexion simulating driving with a stearing wheel ball to be able to drive to college.     Baseline  Patient has not been driving, her family takes her to  school and drops her off.     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     Baseline  Continued difficulty coordinating the use of both hands to complete task, speed still an issue 11/23/17    Status  Partially Met      OT LONG TERM GOAL  #15   TITLE  Patient will complete  meal preparation with minimal assist.     Baseline  moderate assist 11/23/17    Time  12    Period  Weeks    Status  On-going            Plan - 01/03/18 1028    Clinical Impression Statement Pt. reports that smaller earrings are easier to manipulate, and put on however small studs are still difficult to put on. Pt. continues to work towards improving [facility], speed, and dexterity skills.  Pt. requires verbal cues, and visual demonstration for movement pattterns.     Occupational Profile and client history currently impacting functional performance  decreased hand strength, balance and coordination.    Occupational performance deficits (Please refer to evaluation for details):  ADL's;IADL's;Leisure;Education    Rehab Potential  Good    OT Frequency  1x / week    OT Duration  12 weeks    OT Treatment/Interventions  Self-care/ADL training;DME and/or AE instruction;Therapeutic activities;Therapeutic exercise;Neuromuscular education;Patient/family education    Plan  Occupational Therapy 1 x per week for ADL and strenghtening.    OT Home Exercise Plan  try elastic shoe laces and new tech for bra donning and doffing    Consulted and Agree with Plan of Care  Patient    Family Member Consulted  mom       Patient will benefit from skilled therapeutic intervention in order to improve the following deficits and impairments:  Impaired UE functional use, Difficulty walking, Decreased strength, Decreased coordination, Decreased endurance, Decreased balance  Visit Diagnosis: Muscle weakness (generalized)  Other lack of coordination    Problem List There are no active problems to display for this patient.   Harrel Carina, MS, OTR/L 01/03/2018, 10:39 AM  [place] MAIN [facility] SERVICES [address] [place], [place], [address] Phone: [phone]   Fax:  [phone]  Name: AISA SCHOEPPNER MRN: [id number] Date of Birth: [birth year]

## 2018-01-03 NOTE — Addendum Note (Signed)
Addended by: Ezekiel InaMANSFIELD, Eldonna Neuenfeldt S on: 01/03/2018 02:57 PM   Modules accepted: Orders

## 2018-01-10 ENCOUNTER — Ambulatory Visit: Payer: Medicaid Other | Admitting: Physical Therapy

## 2018-01-10 ENCOUNTER — Encounter: Payer: Medicaid Other | Admitting: Occupational Therapy

## 2018-01-11 ENCOUNTER — Ambulatory Visit: Payer: Medicaid Other

## 2018-01-11 DIAGNOSIS — R278 Other lack of coordination: Secondary | ICD-10-CM

## 2018-01-11 DIAGNOSIS — M6281 Muscle weakness (generalized): Secondary | ICD-10-CM | POA: Diagnosis not present

## 2018-01-11 DIAGNOSIS — R262 Difficulty in walking, not elsewhere classified: Secondary | ICD-10-CM

## 2018-01-11 NOTE — Therapy (Signed)
[place] MAIN [facility] SERVICES [address] [place], [place], [address] Phone: [phone]   Fax:  [phone]  Physical Therapy Treatment  Patient Details  Name: Michelle Randall MRN: [id number] Date of Birth: 08-04-1996 Referring Provider: Langley Gauss MD   Encounter Date: 01/11/2018  PT End of Session - 01/11/18 0927    Visit Number  119    Number of Visits  134    Date for PT Re-Evaluation  03/28/18    Authorization Type  12/12    Authorization Time Period  12 visits 12/11-01/16/18     PT Start Time  0925    PT Stop Time  1013    PT Time Calculation (min)  48 min    Equipment Utilized During Treatment  Gait belt    Activity Tolerance  Patient tolerated treatment well;Patient limited by fatigue    Behavior During Therapy  [facility] for tasks assessed/performed       History reviewed. No pertinent past medical history.  History reviewed. No pertinent surgical history.  There were no vitals filed for this visit.  Subjective Assessment - 01/11/18 0925    Subjective  Patient reports doing well today, has been doing well in classes.     Patient is accompained by:  Family member    Limitations  Walking    Patient Stated Goals  Patient wants to improve her core strength.     Currently in Pain?  No/denies     Nustep Lvl 3 4 minutes   Walk across unstable surface (red mat) with LRAD: CGA cues for lifting knees to ceiling. 6x  Utilizing small step to get up and down off high surface to replicate counter stools at family home to allow patient to eat breakfast with family x3 tries   Step up 6" step and step down to replicate curb x 10 each leg Cues for exhaling upon stepping up and stepping down off curb   Static standing without UE support tapping balloon back and forth in // bars 40 taps no LOB   Seated soccer ball kick (LAQ) 10x each leg to make goal into net utilizing response and coordination    Single leg abduction 10x each leg  OTB: focus on keeping one leg stable while moving the other to increase reciprocal coordination 2x BLE   2000g weighted ball seated overhead raises for abdominal activation 2x10 ; cues for keeping elbows in : start at belly button and raise overhead and return to belly button.                             PT Education - 01/11/18 0926    Education provided  Yes    Education Details  LE strength and mobility for ambulatory and transfers    Person(s) Educated  Patient    Methods  Explanation;Demonstration;Verbal cues    Comprehension  Verbalized understanding;Returned demonstration          PT Long Term Goals - 12/27/17 1115      PT LONG TERM GOAL #1   Title  Patient will improve Dynamic Gait Index (DGI) score to > 21/24 for meaningful improvement and low falls risk regarding dynamic walking tasks (revised from > 19/24 )  (Pended)     Baseline  14/24; 11/02/16: 15/24 01/28/17: 18/24 6/5: 19/24; 08/23/17 14/24,  14/24 10/11/17 14/24 12/20/17, 12/27/17/  14/24  (Pended)     Time  12  (  Pended)     Period  Weeks  (Pended)     Status  On-going  (Pended)     Target Date  03/21/18  (Pended)       PT LONG TERM GOAL #2   Title  Patient will increase Berg Balance score by >51/56 points to be considered a low risk for falls for improved safety. (revised from >6 points improvement)  (Pended)     Baseline  11/02/16: 34/56 01/28/17: 39/56 6/5: 45/56; 08/23/2017 = 45/56, 43/56 10/11/17, 43/56 12/20/17, 12/27/17  43/56  (Pended)     Time  12  (Pended)     Period  Weeks  (Pended)     Status  On-going  (Pended)     Target Date  03/21/18  (Pended)       PT LONG TERM GOAL #3   Title  Patient will be able to transfer in and out of a large car with  a high seat with CGA  to improve ability to go to school/doctor visits.   (Pended)     Baseline  Patient requires min A for transfer into large SUV unless it has a step rail on the side  (Pended)     Time  12  (Pended)     Period  Weeks   (Pended)     Status  On-going  (Pended)     Target Date  03/21/18  (Pended)       PT LONG TERM GOAL #4   Title  Patient will complete a TUG test in < 12 seconds for independent mobility and decreased fall risk   (Pended)     Baseline  11.45; 08/23/2017 = 13.66 sec, 13.40 12/20/17, 12/27/17 12.07 sec  (Pended)     Time  12  (Pended)     Period  Weeks  (Pended)     Status  On-going  (Pended)       PT LONG TERM GOAL #5   Title  Patient will improve 6 minute walk distance by > 150 ft for improved return to functional community activities   (Pended)     Baseline  740 01/28/2017: 751f 6/5: 795  730 on 07/19/17; 790 ft on 08/23/18,  740 feet, 730 feet 12/20/17, 12/27/17 650 feet   (Pended)     Time  12  (Pended)     Period  Weeks  (Pended)     Status  On-going  (Pended)     Target Date  03/21/18  (Pended)       PT LONG TERM GOAL #6   Title  Patient will improve gait speed to > 1.2 m/s with least restrictive assistive device to return to normal walking speed   (Pended)     Baseline  .76 m/h 01/28/2017: .87  07/19/17 0.647m; 0.77 m/s on 08/23/17, . 58 m/sec 10/11/17 .6666mc  (Pended)     Time  12  (Pended)     Period  Weeks  (Pended)     Status  On-going  (Pended)  03/21/18 target dat      PT LONG TERM GOAL #7   Title  Patient (< 60 72ars old) will complete five times sit to stand test in < 10 seconds indicating an increased LE strength and improved balance   (Pended)     Baseline  9.45 sec; 9.63 on 08/23/17, 9.63 sec 12/27/17, 10.07 sec  (Pended)     Time  12  (Pended)     Period  Weeks  (Pended)  Status  Achieved  (Pended)  03/26/18 target date      PT LONG TERM GOAL #8   Title  Patient will be able to ambulate on inclines and grass independenlty with LRAD  (Pended)     Baseline  Can ambulate over grassy inclines with decreased speed and safety requiring need loftstrand curtches and SBA  (Pended)     Time  12  (Pended)     Period  Weeks  (Pended)     Status  On-going  (Pended)  03/21/18 target date       PT LONG TERM GOAL  #9   TITLE  Patient will be abe to transfer from low chair or stool without UE support independently  (Pended)     Baseline  Patient needs min A assist to transfer from a low stool  (Pended)     Time  12  (Pended)     Period  Weeks  (Pended)     Status  Partially Met  (Pended)  03/21/18 target date      PT LONG TERM GOAL  #10   TITLE  Patient will be able to transfer from the floor to standing independently with use of LRAD  (Pended)     Baseline  Patient able to stand from the floor independently if she has not been on the floor longer than about 5 min due to stiffness with prolonged positioning  (Pended)     Time  12  (Pended)     Period  Weeks  (Pended)     Status  On-going  (Pended)  03/21/18 target date      PT LONG TERM GOAL  #11   TITLE  Patient will step up onto a 6" step 5x with AD independently to increase community mobility  (Pended)     Baseline  Pt. has increased difficulty requiring supervision for safety, but no physical assist  (Pended)     Time  12  (Pended)     Period  Weeks  (Pended)     Status  On-going  (Pended)  01/03/18 target date            Plan - 01/11/18 1300    Clinical Impression Statement  Patient has demonstrated progression of goals a noted in previous session.  Stepping up from curbs and getting up from hard surface after sitting for a while is challenging to patient. Patient is progressing in LE strength and mobility with increased ability to disassociate LE for improved reciprocal motion. Patient improving with ambulatory capacity over stable and unstable surfaces. Ability to negotiate different height stools  to allow patient to eat breakfast with family improved with repetition. Patient will continue to benefit from skilled physical therapy to improve balance and strength.     Rehab Potential  Good    Clinical Impairments Affecting Rehab Potential  weakness and decreased standing balance    PT Frequency  1x / week    PT  Duration  12 weeks    PT Treatment/Interventions  Therapeutic exercise;Therapeutic activities;Gait training;Balance training;Stair training;DME Instruction;Neuromuscular re-education;Patient/family education    PT Next Visit Plan  Continue POC    PT Home Exercise Plan  added seated dynamic core UE and LE lift offs with visual cues using mirror, supine marches    Consulted and Agree with Plan of Care  Patient       Patient will benefit from skilled therapeutic intervention in order to improve the following deficits and impairments:  Abnormal gait, Decreased  balance, Decreased endurance, Difficulty walking, Decreased strength  Visit Diagnosis: Muscle weakness (generalized)  Other lack of coordination  Difficulty in walking, not elsewhere classified     Problem List There are no active problems to display for this patient.  Janna Arch, PT, DPT   Janna Arch 01/11/2018, 1:09 PM  [place] MAIN [facility] SERVICES [address] [place], [place], [address] Phone: [phone]   Fax:  [phone]  Name: ALORAH MCREE MRN: [id number] Date of Birth: Oct 30, 1996

## 2018-01-13 ENCOUNTER — Ambulatory Visit: Payer: Medicaid Other | Attending: Student | Admitting: Occupational Therapy

## 2018-01-13 ENCOUNTER — Encounter: Payer: Self-pay | Admitting: Occupational Therapy

## 2018-01-13 DIAGNOSIS — R262 Difficulty in walking, not elsewhere classified: Secondary | ICD-10-CM | POA: Diagnosis present

## 2018-01-13 DIAGNOSIS — R278 Other lack of coordination: Secondary | ICD-10-CM | POA: Diagnosis present

## 2018-01-13 DIAGNOSIS — R279 Unspecified lack of coordination: Secondary | ICD-10-CM | POA: Diagnosis present

## 2018-01-13 DIAGNOSIS — M6281 Muscle weakness (generalized): Secondary | ICD-10-CM | POA: Diagnosis present

## 2018-01-13 NOTE — Therapy (Signed)
[place] MAIN [facility] SERVICES [address] [place], [place], [address] Phone: [phone]   Fax:  [phone]  Occupational Therapy Treatment  Patient Details  Name: Michelle Randall MRN: [id number] Date of Birth: 1996/07/11 Referring Provider: Langley Gauss MD   Encounter Date: 01/13/2018  OT End of Session - 01/13/18 1018    Visit Number  111    Number of Visits  130    Date for OT Re-Evaluation  02/17/18    OT Start Time  1006    OT Stop Time  1050    OT Time Calculation (min)  44 min    Activity Tolerance  Patient tolerated treatment well    Behavior During Therapy  [facility] for tasks assessed/performed       History reviewed. No pertinent past medical history.  History reviewed. No pertinent surgical history.  There were no vitals filed for this visit.  Subjective Assessment - 01/13/18 1014    Subjective   Patient denies any pain, reports school is doing well, anatomy class is difficult.  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  Trip is July 1-8th    Patient Stated Goals  To be as independent as possible at home and at school.     Currently in Pain?  No/denies    Pain Score  0-No pain                   OT Treatments/Exercises (OP) - 01/13/18 1022      Neurological Re-education Exercises   Other Exercises 1  Patient seen for sustained grip with right and left hands, one set of 25 reps with 17#, and then 23# for 12 reps each hand.        Fine Motor Coordination (Hand/Wrist)   Other Fine Motor Exercises  Manipulation of tiny beads from red mat to place onto paperclip with right hand to perform task and left hand to stabilize, cues for prehension patterns during task. Removing beads from paperclip and placing back on the board and then picking them up with thumb/index prehension pattern and moving to palm for storage.  Timed judy board manipulation with right hand for 9 rows of 10 each, completed in 2 mins, 30 secs.  Left hand 3 mins 24 secs             OT Education - 01/13/18 1018    Education provided  Yes    Education Details  strengthening, [facility] skills    Person(s) Educated  Patient    Methods  Explanation;Demonstration;Verbal cues    Comprehension  Verbal cues required;Returned demonstration;Verbalized understanding          OT Long Term Goals - 11/23/17 0935      OT LONG TERM GOAL #1   Title  Patient will demonstrate increased strength in right hand sufficient to scoop ice cream from frozen container with modified independence.    Baseline  goal update:  patient still able to complete with minimal assist at times.    Time  12    Period  Months    Status  On-going      OT LONG TERM GOAL #2   Title  Patient will be able to make her bed with modified independence.    Baseline  she continues to have difficulty with balance and completing this task, increased time, but no  assistance needed. Would like to work towards efficiency in time    Time  6    Period  Months    Status  On-going      OT LONG TERM GOAL #3   Title  Patient will complete back closure bra donning with modified independence.     Baseline  max assist at eval    Time  12    Period  Weeks    Status  New    Target Date  02/17/18      OT LONG TERM GOAL #4   Title  Patient will increase BUE strength by 1 mm grade to be able to self propel her wheelchair over carpet for distances greater than 300 feet    Baseline  short distance mobility over carpet about 100 feet before fatigue.     Time  12    Period  Weeks    Status  New    Target Date  02/17/18      OT LONG TERM GOAL #5   Title  Patient will demonstrate the ability to perform small buttons on shirts independently and with good speed in order to dress for school.    Baseline  goal update: Patient can perform with increased time but occasionally needs assist if  rushed, still difficult at goal update 11/23/2017    Time  12    Period  Weeks    Status  Partially Met      OT LONG TERM GOAL #6   Title  Patient will donn and doff small earrings with modified independence.    Baseline  Goal update:  still able to hold small backs and dropping only 20% of the time, slow to complete but improving     Time  12    Period  Weeks    Status  Partially Met      OT LONG TERM GOAL #7   Title  Patient will be able to zip her rainboots while they are on her feet with modified independence.      Baseline  patient unable to fit into current boots secondary to new AFOs    Time  12    Period  Weeks    Status  Deferred      OT LONG TERM GOAL #8   Title  Patient to complete am self care demonstrating efficient methods of dressing skills to get to school on time.     Baseline  Goal update:  patient reports it takes about an hour to get dressed, running late one time this week.  Still working on efficiency and speed of tasks    Time  12    Period  Weeks    Status  Partially Met      OT LONG TERM GOAL  #9   Baseline  Patient will don/doff socks and AFO braces with modified independence using adaptive equipment as needed.  Socks and braces are more difficult after showering and if socks are new, otherwise she can do it.     Time  12    Period  Weeks    Status  Partially Met      OT LONG TERM GOAL  #10   TITLE  Patient will demonstrate the ability to pull jeans down over AFOs with modified independence.     Baseline  continues to require min assist with this task 11/23/17    Time  12    Period  Weeks    Status  On-going      OT LONG TERM GOAL  #11   TITLE  Will be able to squeeze tooth paste out of the tube when it is less than half full.    Baseline  difficulty with small travel sized, may look into alternative ways since this has been a chronic issue.    Time  6    Period  Months    Status  Achieved      OT LONG TERM GOAL  #12   TITLE  Patient will  demonstrate managing wheelchair rain poncho in inclement weather with modified independence.    Baseline  crutch tips are difficult for anyone to take them off, goal deferred.    Time  6    Period  Months    Status  Achieved      OT LONG TERM GOAL  #13   TITLE  Patient will be able to complete 2 1/2 minutes straight with left shoulder flexion simulating driving with a stearing wheel ball to be able to drive to college.     Baseline  Patient has not been driving, her family takes her to school and drops her off.     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     Baseline  Continued difficulty coordinating the use of both hands to complete task, speed still an issue 11/23/17    Status  Partially Met      OT LONG TERM GOAL  #15   TITLE  Patient will complete meal preparation with minimal assist.     Baseline  moderate assist 11/23/17    Time  12    Period  Weeks    Status  On-going            Plan - 01/13/18 1019    Clinical Impression Statement  Patient continues to progress with bilateral hand skills and performance of ADL tasks.  She is hoping to go on an international trip in July of this year and will need to be independent with traveling alone and with self care and IADL tasks.  Will continue to work towards skills to improve independence in these areas.     Occupational Profile and client history currently impacting functional performance  decreased hand strength, balance and coordination.    Occupational performance deficits (Please refer to evaluation for details):  ADL's;IADL's;Leisure;Education    Rehab Potential  Good    OT Frequency  1x / week    OT Duration  12 weeks    OT Treatment/Interventions  Self-care/ADL training;DME and/or AE instruction;Therapeutic activities;Therapeutic  exercise;Neuromuscular education;Patient/family education    Consulted and Agree with Plan of Care  Patient       Patient will benefit from skilled therapeutic intervention in order to improve the following deficits and impairments:  Impaired UE functional use, Difficulty walking, Decreased strength, Decreased coordination, Decreased endurance, Decreased balance  Visit Diagnosis: Muscle weakness (generalized)  Other lack of coordination    Problem List There are no active problems to display for this patient.  Achilles Dunk, OTR/L, CLT  Lennie Vasco 01/13/2018, 11:51 AM  [place] MAIN [facility] SERVICES [address] [place], [place], [address] Phone: [phone]  Fax:  [phone]  Name: Michelle Randall MRN: [id number] Date of Birth: [birth year]

## 2018-01-17 ENCOUNTER — Ambulatory Visit: Payer: Medicaid Other | Admitting: Occupational Therapy

## 2018-01-17 ENCOUNTER — Encounter: Payer: Self-pay | Admitting: Occupational Therapy

## 2018-01-17 ENCOUNTER — Ambulatory Visit: Payer: Medicaid Other | Admitting: Physical Therapy

## 2018-01-17 DIAGNOSIS — R278 Other lack of coordination: Secondary | ICD-10-CM

## 2018-01-17 DIAGNOSIS — M6281 Muscle weakness (generalized): Secondary | ICD-10-CM

## 2018-01-18 ENCOUNTER — Ambulatory Visit: Payer: Medicaid Other

## 2018-01-18 DIAGNOSIS — M6281 Muscle weakness (generalized): Secondary | ICD-10-CM | POA: Diagnosis not present

## 2018-01-18 DIAGNOSIS — R262 Difficulty in walking, not elsewhere classified: Secondary | ICD-10-CM

## 2018-01-18 DIAGNOSIS — R279 Unspecified lack of coordination: Secondary | ICD-10-CM

## 2018-01-18 DIAGNOSIS — R278 Other lack of coordination: Secondary | ICD-10-CM
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
## 2018-01-18 NOTE — Therapy (Signed)
[place] MAIN [facility] SERVICES [address] [place], [place], [address] Phone: [phone]   Fax:  [phone]  Physical Therapy Treatment  Patient Details  Name: Michelle Randall MRN: [id number] Date of Birth: 12-15-1995 Referring Provider: Langley Gauss MD   Encounter Date: 01/18/2018  PT End of Session - 01/18/18 0912    Visit Number  120    Number of Visits  134    Date for PT Re-Evaluation  03/28/18    Authorization Type  1/12    Authorization Time Period  12 visits 3/6-5/28    PT Start Time  0847    PT Stop Time  0930    PT Time Calculation (min)  43 min    Equipment Utilized During Treatment  Gait belt    Activity Tolerance  Patient tolerated treatment well;Patient limited by fatigue    Behavior During Therapy  [facility] for tasks assessed/performed       History reviewed. No pertinent past medical history.  History reviewed. No pertinent surgical history.  There were no vitals filed for this visit.  Subjective Assessment - 01/18/18 0851    Subjective  Patient's first class was cancelled this morning so she is still waking up. Patient will be going on a trip to [place] this summer. Was sore after last session     Patient is accompained by:  Family member    Limitations  Walking    Patient Stated Goals  Patient wants to improve her core strength.     Currently in Pain?  No/denies       Ther-ex  Prone hamstring curls with mod A to R min A to L x 10 bilateral; Pball HS curls x 20 bilateral; 5lb bar overhead with single leg raises 10x each leg, 2 sets each leg supine Hello dollys supine 1x10  //bars 6" toe taps 15x each leg BUE support, cues for reduction circumduction Side step up 4" step with BUE support 8x each direction  Seated LAQ kicking soccer ball with 10 good kicks each leg . One episode of both kicking at same time.   Prone hip flexor stretch with towel under knee 2x 60 each leg    Pt. response to medical  necessity: Patient will continue to benefit from skilled physical therapy in order to maximize functional mobility and safety.                          PT Education - 01/18/18 [phone]    Education provided  Yes    Education Details  functional strength and mobility     Person(s) Educated  Patient    Methods  Explanation;Demonstration;Verbal cues    Comprehension  Verbalized understanding;Returned demonstration          PT Long Term Goals - 01/11/18 1407      PT LONG TERM GOAL #1   Title  Patient will improve Dynamic Gait Index (DGI) score to > 21/24 for meaningful improvement and low falls risk regarding dynamic walking tasks (revised from > 19/24 )    Baseline  14/24; 11/02/16: 15/24 01/28/17: 18/24 6/5: 19/24; 08/23/17 14/24,  14/24 10/11/17 14/24 12/20/17, 12/27/17/  14/24    Time  12    Period  Weeks    Status  On-going      PT LONG TERM GOAL #2   Title  Patient will increase Berg Balance score by >51/56 points to be considered a low risk  for falls for improved safety. (revised from >6 points improvement)    Baseline  11/02/16: 34/56 01/28/17: 39/56 6/5: 45/56; 08/23/2017 = 45/56, 43/56 10/11/17, 43/56 12/20/17, 12/27/17  43/56    Time  12    Period  Weeks    Status  On-going      PT LONG TERM GOAL #3   Title  Patient will be able to transfer in and out of a large car with  a high seat with CGA  to improve ability to go to school/doctor visits.     Baseline  Patient requires min A for transfer into large SUV unless it has a step rail on the side    Time  12    Period  Weeks    Status  On-going      PT LONG TERM GOAL #4   Title  Patient will complete a TUG test in < 12 seconds for independent mobility and decreased fall risk     Baseline  11.45; 08/23/2017 = 13.66 sec, 13.40 12/20/17, 12/27/17 12.07 sec    Time  12    Period  Weeks    Status  On-going      PT LONG TERM GOAL #5   Title  Patient will improve 6 minute walk distance by > 150 ft for improved return  to functional community activities     Baseline  740 01/28/2017: 797f 6/5: 795  730 on 07/19/17; 790 ft on 08/23/18,  740 feet, 730 feet 12/20/17, 12/27/17 650 feet     Time  12    Period  Weeks    Status  On-going      PT LONG TERM GOAL #6   Title  Patient will improve gait speed to > 1.2 m/s with least restrictive assistive device to return to normal walking speed     Baseline  .76 m/h 01/28/2017: .87  07/19/17 0.673m; 0.77 m/s on 08/23/17, . 58 m/sec 10/11/17 .661mc    Time  12    Period  Weeks    Status  On-going 03/21/18 target dat      PT LONG TERM GOAL #7   Title  Patient (< 60 30ars old) will complete five times sit to stand test in < 10 seconds indicating an increased LE strength and improved balance     Baseline  9.45 sec; 9.63 on 08/23/17, 9.63 sec 12/27/17, 10.07 sec    Time  12    Period  Weeks    Status  Achieved 03/26/18 target date      [place]   Title  Patient will be able to ambulate on inclines and grass independenlty with LRAD    Baseline  Can ambulate over grassy inclines with decreased speed and safety requiring need loftstrand curtches and SBA    Time  12    Period  Weeks    Status  On-going 03/21/18 target date      PT LONG TERM GOAL  #9   TITLE  Patient will be abe to transfer from low chair or stool without UE support independently    Baseline  Patient needs min A assist to transfer from a low stool    Time  12    Period  Weeks    Status  Partially Met 03/21/18 target date      PT LONG TERM GOAL  #10   TITLE  Patient will be able to transfer from the floor to standing independently with use of LRAD  Baseline  Patient able to stand from the floor independently if she has not been on the floor longer than about 5 min due to stiffness with prolonged positioning    Time  12    Period  Weeks    Status  On-going 03/21/18 target date      PT LONG TERM GOAL  #11   TITLE  Patient will step up onto a 6" step 5x with AD independently to increase community  mobility    Baseline  Pt. has increased difficulty requiring supervision for safety, but no physical assist    Time  12    Period  Weeks    Status  On-going 01/03/18 target date            Plan - 01/18/18 1244    Clinical Impression Statement  Patient has noted scissoring pattern with fatigue in standing and supine interventions. Hip flexor tightness reduced via prone stretch allowing for upright posture during standing interventions. Patient had noted compensatory circumduction of L and RLE upon fatigue when attempting stepping up laterally or anteriorly. Patient will continue to benefit from skilled physical therapy in order to maximize functional mobility and safety.    Rehab Potential  Good    Clinical Impairments Affecting Rehab Potential  weakness and decreased standing balance    PT Frequency  1x / week    PT Duration  12 weeks    PT Treatment/Interventions  Therapeutic exercise;Therapeutic activities;Gait training;Balance training;Stair training;DME Instruction;Neuromuscular re-education;Patient/family education    PT Next Visit Plan  Continue POC    PT Home Exercise Plan  added seated dynamic core UE and LE lift offs with visual cues using mirror, supine marches    Consulted and Agree with Plan of Care  Patient       Patient will benefit from skilled therapeutic intervention in order to improve the following deficits and impairments:  Abnormal gait, Decreased balance, Decreased endurance, Difficulty walking, Decreased strength  Visit Diagnosis: Muscle weakness (generalized)  Other lack of coordination  Difficulty in walking, not elsewhere classified  Lack of coordination     Problem List There are no active problems to display for this patient.  Janna Arch, PT, DPT   Janna Arch 01/18/2018, 12:46 PM  [place] MAIN [facility] SERVICES [address] [place], [place], [address] Phone: [phone]   Fax:  [phone]  Name:  Michelle Randall MRN: [id number] Date of Birth: 1996-08-31

## 2018-01-24 ENCOUNTER — Ambulatory Visit: Payer: Medicaid Other | Admitting: Physical Therapy

## 2018-01-24 ENCOUNTER — Ambulatory Visit: Payer: Medicaid Other | Admitting: Occupational Therapy

## 2018-01-25 ENCOUNTER — Encounter: Payer: Self-pay | Admitting: Physical Therapy

## 2018-01-25 ENCOUNTER — Ambulatory Visit: Payer: Medicaid Other | Admitting: Physical Therapy

## 2018-01-25 DIAGNOSIS — R278 Other lack of coordination: Secondary | ICD-10-CM

## 2018-01-25 DIAGNOSIS — M6281 Muscle weakness (generalized): Secondary | ICD-10-CM | POA: Diagnosis not present

## 2018-01-25 DIAGNOSIS — R279 Unspecified lack of coordination: Secondary | ICD-10-CM

## 2018-01-25 DIAGNOSIS — R262 Difficulty in walking, not elsewhere classified: Secondary | ICD-10-CM

## 2018-01-25 NOTE — Therapy (Signed)
[place] MAIN [facility] SERVICES [address] [place], [place], [address] Phone: [phone]   Fax:  [phone]  Physical Therapy Treatment  Patient Details  Name: Michelle Randall MRN: [id number] Date of Birth: July 28, 1996 Referring Provider: Langley Gauss MD   Encounter Date: 01/25/2018  PT End of Session - 01/25/18 0846    Visit Number  121    Number of Visits  134    Date for PT Re-Evaluation  03/28/18    Authorization Type  2/12    Authorization Time Period  12 visits 3/6-5/28    PT Start Time  0846    PT Stop Time  0929    PT Time Calculation (min)  43 min    Equipment Utilized During Treatment  Gait belt    Activity Tolerance  Patient tolerated treatment well;Patient limited by fatigue    Behavior During Therapy  [facility] for tasks assessed/performed       History reviewed. No pertinent past medical history.  History reviewed. No pertinent surgical history.  There were no vitals filed for this visit.  Subjective Assessment - 01/25/18 0849    Subjective  Pt is doing well.  No new complaints or concerns.  Pt has been completing her HEP each day when she is not too busy with school.     Patient is accompained by:  Family member    Limitations  Walking    Patient Stated Goals  Patient wants to improve her core strength.     Currently in Pain?  No/denies         TREATMENT   Alternating toe taps up to 8" step. x20 each LE.   Step ups to 8" step. x20. Cues for upright posture as pt has tendency to demonstrate flexed posture, especially when stepping up.   Side stepping up and over 8" step x10 each direction. Cues to avoid circumduction but rather to perform hip flexion.   Seated LE coordination exercise with moving hand as target 2x15 each LE   LAQ with 5# ankle weights x20 each LE with cues to avoid compensatory trunk lean with R LAQ.   Prone hip flexor stretch with towel under knee x3 minutes each leg   Rhomberg stance  on airex and ball toss to basket 10 ft away.                      PT Education - 01/25/18 0846    Education provided  Yes    Education Details  Exercise technique    Person(s) Educated  Patient    Methods  Explanation;Demonstration;Verbal cues    Comprehension  Verbalized understanding;Returned demonstration;Verbal cues required;Need further instruction          PT Long Term Goals - 01/11/18 1407      PT LONG TERM GOAL #1   Title  Patient will improve Dynamic Gait Index (DGI) score to > 21/24 for meaningful improvement and low falls risk regarding dynamic walking tasks (revised from > 19/24 )    Baseline  14/24; 11/02/16: 15/24 01/28/17: 18/24 6/5: 19/24; 08/23/17 14/24,  14/24 10/11/17 14/24 12/20/17, 12/27/17/  14/24    Time  12    Period  Weeks    Status  On-going      PT LONG TERM GOAL #2   Title  Patient will increase Berg Balance score by >51/56 points to be considered a low risk for falls for improved safety. (revised from >6 points  improvement)    Baseline  11/02/16: 34/56 01/28/17: 39/56 6/5: 45/56; 08/23/2017 = 45/56, 43/56 10/11/17, 43/56 12/20/17, 12/27/17  43/56    Time  12    Period  Weeks    Status  On-going      PT LONG TERM GOAL #3   Title  Patient will be able to transfer in and out of a large car with  a high seat with CGA  to improve ability to go to school/doctor visits.     Baseline  Patient requires min A for transfer into large SUV unless it has a step rail on the side    Time  12    Period  Weeks    Status  On-going      PT LONG TERM GOAL #4   Title  Patient will complete a TUG test in < 12 seconds for independent mobility and decreased fall risk     Baseline  11.45; 08/23/2017 = 13.66 sec, 13.40 12/20/17, 12/27/17 12.07 sec    Time  12    Period  Weeks    Status  On-going      PT LONG TERM GOAL #5   Title  Patient will improve 6 minute walk distance by > 150 ft for improved return to functional community activities     Baseline  740  01/28/2017: 77f 6/5: 795  730 on 07/19/17; 790 ft on 08/23/18,  740 feet, 730 feet 12/20/17, 12/27/17 650 feet     Time  12    Period  Weeks    Status  On-going      PT LONG TERM GOAL #6   Title  Patient will improve gait speed to > 1.2 m/s with least restrictive assistive device to return to normal walking speed     Baseline  .76 m/h 01/28/2017: .87  07/19/17 0.633m; 0.77 m/s on 08/23/17, . 58 m/sec 10/11/17 .6612mc    Time  12    Period  Weeks    Status  On-going 03/21/18 target dat      PT LONG TERM GOAL #7   Title  Patient (< 60 57ars old) will complete five times sit to stand test in < 10 seconds indicating an increased LE strength and improved balance     Baseline  9.45 sec; 9.63 on 08/23/17, 9.63 sec 12/27/17, 10.07 sec    Time  12    Period  Weeks    Status  Achieved 03/26/18 target date      PT LONG TERM GOAL #8   Title  Patient will be able to ambulate on inclines and grass independenlty with LRAD    Baseline  Can ambulate over grassy inclines with decreased speed and safety requiring need loftstrand curtches and SBA    Time  12    Period  Weeks    Status  On-going 03/21/18 target date      PT LONG TERM GOAL  #9   TITLE  Patient will be abe to transfer from low chair or stool without UE support independently    Baseline  Patient needs min A assist to transfer from a low stool    Time  12    Period  Weeks    Status  Partially Met 03/21/18 target date      PT LONG TERM GOAL  #10   TITLE  Patient will be able to transfer from the floor to standing independently with use of LRAD    Baseline  Patient able to stand  from the floor independently if she has not been on the floor longer than about 5 min due to stiffness with prolonged positioning    Time  12    Period  Weeks    Status  On-going 03/21/18 target date      PT LONG TERM GOAL  #11   TITLE  Patient will step up onto a 6" step 5x with AD independently to increase community mobility    Baseline  Pt. has increased difficulty requiring  supervision for safety, but no physical assist    Time  12    Period  Weeks    Status  On-going 01/03/18 target date            Plan - 01/25/18 1595    Clinical Impression Statement  Pt demonstrates circumduction with stepping activity onto 8" step which improves with cues to perform greater hip flexion to avoid this compensatory behavior.  Cues provided for upright posture with stepping activity as well as pt has tendency for flexing trunk.  Pt completed seated LE coordination exercise with fatigue in hip flexors at end of exercise.  Pt will benefit from continued skilled PT interventions for improved gait mechanics, strength, and balance.     Rehab Potential  Good    Clinical Impairments Affecting Rehab Potential  weakness and decreased standing balance    PT Frequency  1x / week    PT Duration  12 weeks    PT Treatment/Interventions  Therapeutic exercise;Therapeutic activities;Gait training;Balance training;Stair training;DME Instruction;Neuromuscular re-education;Patient/family education    PT Next Visit Plan  Continue POC    PT Home Exercise Plan  added seated dynamic core UE and LE lift offs with visual cues using mirror, supine marches    Consulted and Agree with Plan of Care  Patient       Patient will benefit from skilled therapeutic intervention in order to improve the following deficits and impairments:  Abnormal gait, Decreased balance, Decreased endurance, Difficulty walking, Decreased strength  Visit Diagnosis: Muscle weakness (generalized)  Other lack of coordination  Difficulty in walking, not elsewhere classified  Lack of coordination     Problem List There are no active problems to display for this patient.   Collie Siad PT, DPT 01/25/2018, 9:36 AM  [place] MAIN [facility] SERVICES [address] [place], [place], [address] Phone: [phone]   Fax:  [phone]  Name: Michelle Randall MRN: [id number] Date of  Birth: Jun 08, 1996

## 2018-01-27 ENCOUNTER — Ambulatory Visit: Payer: Medicaid Other | Admitting: Physical Therapy

## 2018-01-31 ENCOUNTER — Encounter: Payer: Self-pay | Admitting: Occupational Therapy

## 2018-01-31 ENCOUNTER — Ambulatory Visit: Payer: Medicaid Other | Admitting: Physical Therapy

## 2018-01-31 ENCOUNTER — Ambulatory Visit: Payer: Medicaid Other | Admitting: Occupational Therapy

## 2018-01-31 DIAGNOSIS — R278 Other lack of coordination: Secondary | ICD-10-CM

## 2018-01-31 DIAGNOSIS — M6281 Muscle weakness (generalized): Secondary | ICD-10-CM

## 2018-01-31 NOTE — Therapy (Signed)
[place] MAIN [facility] SERVICES [address] [place], [place], [address] Phone: [phone]   Fax:  [phone]  Occupational Therapy Treatment  Patient Details  Name: Michelle Randall MRN: [id number] Date of Birth: [birth year] Referring Provider: Langley Gauss MD   Encounter Date: 01/31/2018  OT End of Session - 01/31/18 1022    Visit Number  113    Number of Visits  130    Date for OT Re-Evaluation  02/17/18    OT Start Time  1015    OT Stop Time  1100    OT Time Calculation (min)  45 min       History reviewed. No pertinent past medical history.  History reviewed. No pertinent surgical history.  There were no vitals filed for this visit.  Subjective Assessment - 01/31/18 1020    Subjective   Spring Break is March 20 she is hoping to go visit a friend in [place] but is unsure if she can get transportation. If not, just planning to relax and stay home.     Patient Stated Goals  To be as independent as possible at home and at school.     Currently in Pain?  No/denies    Pain Score  0-No pain    Multiple Pain Sites  No                   OT Treatments/Exercises (OP) - 01/31/18 1118      Neurological Re-education Exercises   Other Exercises 1  Patient seen for grip strengthening tasks for sustained grip with 3rd setting 17# and 2nd setting 11# for 25 reps right and left hands. Occasional rest breaks and readjustment of grip with higher weight setting.   Patient seen for strengthening of wrist with resistive Velcro board with cues for technique, multiple repetitions completed.  Red theraband exercises in sitting with bilateral UE with cues for form and technique, performing shoulder flexion, ABD, elbow flexion extension and diagonal patterns, 2 sets of 15 repetitions each.     Other Exercises 2  Patient seen for manipulation of small sticks to place into grid with right hand, utilizing  oppositional movements with fingers from  thumb to index, middle and ring to place and then remove, using translatory movements of the hand and using the hand for storage with cues.              OT Education - 01/31/18 1022    Education provided  Yes    Education Details  gripping tasks, coordination    Person(s) Educated  Patient    Methods  Explanation;Demonstration;Verbal cues    Comprehension  Verbalized understanding;Returned demonstration;Verbal cues required          OT Long Term Goals - 11/23/17 0935      OT LONG TERM GOAL #1   Title  Patient will demonstrate increased strength in right hand sufficient to scoop ice cream from frozen container with modified independence.    Baseline  goal update:  patient still able to complete with minimal assist at times.    Time  12    Period  Months    Status  On-going      OT LONG TERM GOAL #2   Title  Patient will be able to make her bed with modified independence.    Baseline  she continues to have difficulty with balance and completing this task, increased time, but no assistance needed. Would like to work towards  efficiency in time    Time  6    Period  Months    Status  On-going      OT LONG TERM GOAL #3   Title  Patient will complete back closure bra donning with modified independence.     Baseline  max assist at eval    Time  12    Period  Weeks    Status  New    Target Date  02/17/18      OT LONG TERM GOAL #4   Title  Patient will increase BUE strength by 1 mm grade to be able to self propel her wheelchair over carpet for distances greater than 300 feet    Baseline  short distance mobility over carpet about 100 feet before fatigue.     Time  12    Period  Weeks    Status  New    Target Date  02/17/18      OT LONG TERM GOAL #5   Title  Patient will demonstrate the ability to perform small buttons on shirts independently and with good speed in order to dress for school.    Baseline  goal update: Patient can perform with increased time but occasionally  needs assist if rushed, still difficult at goal update 11/23/2017    Time  12    Period  Weeks    Status  Partially Met      OT LONG TERM GOAL #6   Title  Patient will donn and doff small earrings with modified independence.    Baseline  Goal update:  still able to hold small backs and dropping only 20% of the time, slow to complete but improving     Time  12    Period  Weeks    Status  Partially Met      OT LONG TERM GOAL #7   Title  Patient will be able to zip her rainboots while they are on her feet with modified independence.      Baseline  patient unable to fit into current boots secondary to new AFOs    Time  12    Period  Weeks    Status  Deferred      OT LONG TERM GOAL #8   Title  Patient to complete am self care demonstrating efficient methods of dressing skills to get to school on time.     Baseline  Goal update:  patient reports it takes about an hour to get dressed, running late one time this week.  Still working on efficiency and speed of tasks    Time  12    Period  Weeks    Status  Partially Met      OT LONG TERM GOAL  #9   Baseline  Patient will don/doff socks and AFO braces with modified independence using adaptive equipment as needed.  Socks and braces are more difficult after showering and if socks are new, otherwise she can do it.     Time  12    Period  Weeks    Status  Partially Met      OT LONG TERM GOAL  #10   TITLE  Patient will demonstrate the ability to pull jeans down over AFOs with modified independence.     Baseline  continues to require min assist with this task 11/23/17    Time  12    Period  Weeks    Status  On-going      OT  LONG TERM GOAL  #11   TITLE  Will be able to squeeze tooth paste out of the tube when it is less than half full.    Baseline  difficulty with small travel sized, may look into alternative ways since this has been a chronic issue.    Time  6    Period  Months    Status  Achieved      OT LONG TERM GOAL  #12   TITLE   Patient will demonstrate managing wheelchair rain poncho in inclement weather with modified independence.    Baseline  crutch tips are difficult for anyone to take them off, goal deferred.    Time  6    Period  Months    Status  Achieved      OT LONG TERM GOAL  #13   TITLE  Patient will be able to complete 2 1/2 minutes straight with left shoulder flexion simulating driving with a stearing wheel ball to be able to drive to college.     Baseline  Patient has not been driving, her family takes her to school and drops her off.     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     Baseline  Continued difficulty coordinating the use of both hands to complete task, speed still an issue 11/23/17    Status  Partially Met      OT LONG TERM GOAL  #15   TITLE  Patient will complete meal preparation with minimal assist.     Baseline  moderate assist 11/23/17    Time  12    Period  Weeks    Status  On-going            Plan - 01/31/18 1023    Clinical Impression Statement  Patient continues to progress with strength, ROM and functional hand use for daily tasks. She denies any current difficulties with her college classes and has accomodations in place.  At home she is still working on speed of self care tasks, assisting with meal prep and light housekeeping tasks to become more independent.  Is planning to take an international trip alone in the summer and will need to be independent.     Occupational Profile and client history currently impacting functional performance  decreased hand strength, balance and coordination.    Occupational performance deficits (Please refer to evaluation for details):  ADL's;IADL's;Leisure;Education    Rehab Potential  Good    OT Frequency  1x / week    OT Duration  12 weeks    OT  Treatment/Interventions  Self-care/ADL training;DME and/or AE instruction;Therapeutic activities;Therapeutic exercise;Neuromuscular education;Patient/family education    Consulted and Agree with Plan of Care  Patient       Patient will benefit from skilled therapeutic intervention in order to improve the following deficits and impairments:  Impaired UE functional use, Difficulty walking, Decreased strength, Decreased coordination, Decreased endurance, Decreased balance  Visit Diagnosis: Muscle weakness (generalized)  Other lack of coordination    Problem List There are no active problems to display for this patient.  Savannaha Stonerock T Tomasita Morrow, OTR/L, CLT  Victor Langenbach 01/31/2018, 11:22 AM  [place] MAIN [facility] SERVICES [address]  [place], [place], [address] Phone: [phone]   Fax:  [phone]  Name: CHANIQUE DUCA MRN: [id number] Date of Birth: 1996-06-12

## 2018-02-01 ENCOUNTER — Ambulatory Visit: Payer: Medicaid Other

## 2018-02-01 DIAGNOSIS — R278 Other lack of coordination: Secondary | ICD-10-CM

## 2018-02-01 DIAGNOSIS — R279 Unspecified lack of coordination: Secondary | ICD-10-CM

## 2018-02-01 DIAGNOSIS — M6281 Muscle weakness (generalized): Secondary | ICD-10-CM | POA: Diagnosis not present

## 2018-02-01 DIAGNOSIS — R262 Difficulty in walking, not elsewhere classified: Secondary | ICD-10-CM

## 2018-02-01 NOTE — Therapy (Signed)
[place] MAIN [facility] SERVICES [address] [place], [place], [address] Phone: [phone]   Fax:  [phone]  Physical Therapy Treatment  Patient Details  Name: Michelle Randall MRN: [id number] Date of Birth: 05/19/1996 Referring Provider: Langley Gauss MD   Encounter Date: 02/01/2018  PT End of Session - 02/01/18 1114    Visit Number  122    Number of Visits  134    Date for PT Re-Evaluation  03/28/18    Authorization Type  3/12    Authorization Time Period  12 visits 3/6-5/28    PT Start Time  1108    PT Stop Time  1155    PT Time Calculation (min)  47 min    Equipment Utilized During Treatment  Gait belt    Activity Tolerance  Patient tolerated treatment well;Patient limited by fatigue    Behavior During Therapy  [facility] for tasks assessed/performed       History reviewed. No pertinent past medical history.  History reviewed. No pertinent surgical history.  There were no vitals filed for this visit.  Subjective Assessment - 02/01/18 1111    Subjective  Patient is tired due to having midterms this week. Has been studying and performing a lot of schoolwork.     Patient is accompained by:  Family member    Limitations  Walking    Patient Stated Goals  Patient wants to improve her core strength.     Currently in Pain?  No/denies       Nustep lvl 3 4 minutes  Alternating toe taps up to 6" step. x20 each LE.    Step ups to 6" step. x20. Cues for upright posture as pt has tendency to demonstrate flexed posture, especially when stepping up.    Side stepping up and over 8" step x10 each direction. Cues to avoid circumduction but rather to perform hip flexion.    Seated LE coordination exercise with moving hand as target 2x15 each LE    Bilateral hip flexor stretch 2 minutes    Prone hip flexor stretch with towel under knee x3 minutes each leg    Airex pad balloon toss 60x   Side stepping in // bars 8 x      Pt.  response to medical necessity:  Patient will continue to benefit from skilled PT interventions for improved gait mechanics, strength, and balance.                  PT Education - 02/01/18 1113    Education provided  Yes    Education Details  functional mobility , step length, LE strength and mobility     Person(s) Educated  Patient    Methods  Explanation;Demonstration;Verbal cues    Comprehension  Verbalized understanding;Returned demonstration          PT Long Term Goals - 01/11/18 1407      PT LONG TERM GOAL #1   Title  Patient will improve Dynamic Gait Index (DGI) score to > 21/24 for meaningful improvement and low falls risk regarding dynamic walking tasks (revised from > 19/24 )    Baseline  14/24; 11/02/16: 15/24 01/28/17: 18/24 6/5: 19/24; 08/23/17 14/24,  14/24 10/11/17 14/24 12/20/17, 12/27/17/  14/24    Time  12    Period  Weeks    Status  On-going      PT LONG TERM GOAL #2   Title  Patient will increase Berg Balance score by >51/56 points  to be considered a low risk for falls for improved safety. (revised from >6 points improvement)    Baseline  11/02/16: 34/56 01/28/17: 39/56 6/5: 45/56; 08/23/2017 = 45/56, 43/56 10/11/17, 43/56 12/20/17, 12/27/17  43/56    Time  12    Period  Weeks    Status  On-going      PT LONG TERM GOAL #3   Title  Patient will be able to transfer in and out of a large car with  a high seat with CGA  to improve ability to go to school/doctor visits.     Baseline  Patient requires min A for transfer into large SUV unless it has a step rail on the side    Time  12    Period  Weeks    Status  On-going      PT LONG TERM GOAL #4   Title  Patient will complete a TUG test in < 12 seconds for independent mobility and decreased fall risk     Baseline  11.45; 08/23/2017 = 13.66 sec, 13.40 12/20/17, 12/27/17 12.07 sec    Time  12    Period  Weeks    Status  On-going      PT LONG TERM GOAL #5   Title  Patient will improve 6 minute walk distance by  > 150 ft for improved return to functional community activities     Baseline  740 01/28/2017: 780f 6/5: 795  730 on 07/19/17; 790 ft on 08/23/18,  740 feet, 730 feet 12/20/17, 12/27/17 650 feet     Time  12    Period  Weeks    Status  On-going      PT LONG TERM GOAL #6   Title  Patient will improve gait speed to > 1.2 m/s with least restrictive assistive device to return to normal walking speed     Baseline  .76 m/h 01/28/2017: .87  07/19/17 0.641m; 0.77 m/s on 08/23/17, . 58 m/sec 10/11/17 .6658mc    Time  12    Period  Weeks    Status  On-going 03/21/18 target dat      PT LONG TERM GOAL #7   Title  Patient (< 60 55ars old) will complete five times sit to stand test in < 10 seconds indicating an increased LE strength and improved balance     Baseline  9.45 sec; 9.63 on 08/23/17, 9.63 sec 12/27/17, 10.07 sec    Time  12    Period  Weeks    Status  Achieved 03/26/18 target date      PT [place]   Title  Patient will be able to ambulate on inclines and grass independenlty with LRAD    Baseline  Can ambulate over grassy inclines with decreased speed and safety requiring need loftstrand curtches and SBA    Time  12    Period  Weeks    Status  On-going 03/21/18 target date      PT LONG TERM GOAL  #9   TITLE  Patient will be abe to transfer from low chair or stool without UE support independently    Baseline  Patient needs min A assist to transfer from a low stool    Time  12    Period  Weeks    Status  Partially Met 03/21/18 target date      PT LONG TERM GOAL  #10   TITLE  Patient will be able to transfer from the floor to  standing independently with use of LRAD    Baseline  Patient able to stand from the floor independently if she has not been on the floor longer than about 5 min due to stiffness with prolonged positioning    Time  12    Period  Weeks    Status  On-going 03/21/18 target date      PT LONG TERM GOAL  #11   TITLE  Patient will step up onto a 6" step 5x with AD independently  to increase community mobility    Baseline  Pt. has increased difficulty requiring supervision for safety, but no physical assist    Time  12    Period  Weeks    Status  On-going 01/03/18 target date            Plan - 02/01/18 1200    Clinical Impression Statement  Patient presented with excessively tight hip flexors and limited muscular endurance due to increased stress and seated positioning from midterm week. Improved mobility and quality of movement noted with repetition. Excessive trunk posturing noted with step ups and patient educated on core activation. Patient will continue to benefit from skilled PT interventions for improved gait mechanics, strength, and balance.      Rehab Potential  Good    Clinical Impairments Affecting Rehab Potential  weakness and decreased standing balance    PT Frequency  1x / week    PT Duration  12 weeks    PT Treatment/Interventions  Therapeutic exercise;Therapeutic activities;Gait training;Balance training;Stair training;DME Instruction;Neuromuscular re-education;Patient/family education    PT Next Visit Plan  Continue POC    PT Home Exercise Plan  added seated dynamic core UE and LE lift offs with visual cues using mirror, supine marches    Consulted and Agree with Plan of Care  Patient       Patient will benefit from skilled therapeutic intervention in order to improve the following deficits and impairments:  Abnormal gait, Decreased balance, Decreased endurance, Difficulty walking, Decreased strength  Visit Diagnosis: Muscle weakness (generalized)  Other lack of coordination  Difficulty in walking, not elsewhere classified  Lack of coordination     Problem List There are no active problems to display for this patient. Janna Arch, PT, DPT   Janna Arch 02/01/2018, 12:01 PM  [place] MAIN [facility] SERVICES [address] [place], [place], [address] Phone: [phone]   Fax:   [phone]  Name: Michelle Randall MRN: [id number] Date of Birth: [birth year]

## 2018-02-07 ENCOUNTER — Ambulatory Visit: Payer: Medicaid Other | Admitting: Occupational Therapy

## 2018-02-07 ENCOUNTER — Ambulatory Visit: Payer: Medicaid Other | Admitting: Physical Therapy

## 2018-02-07 DIAGNOSIS — R278 Other lack of coordination: Secondary | ICD-10-CM

## 2018-02-07 DIAGNOSIS — M6281 Muscle weakness (generalized): Secondary | ICD-10-CM | POA: Diagnosis not present

## 2018-02-07 NOTE — Therapy (Signed)
[place] MAIN [facility] SERVICES [address] [place], [place], [address] Phone: [phone]   Fax:  [phone]  Occupational Therapy Treatment  Patient Details  Name: Michelle Randall MRN: [id number] Date of Birth: 1996-09-04 Referring Provider: Langley Gauss MD   Encounter Date: 02/07/2018  OT End of Session - 02/07/18 1032    Visit Number  114    Number of Visits  130    Date for OT Re-Evaluation  02/17/18    Authorization Type  medicaid visit 64 of 118    OT Start Time  1015    OT Stop Time  1100    OT Time Calculation (min)  45 min    Activity Tolerance  Patient tolerated treatment well    Behavior During Therapy  [facility] for tasks assessed/performed       No past medical history on file.  No past surgical history on file.  There were no vitals filed for this visit.  Subjective Assessment - 02/07/18 1028    Subjective   Pt. reports her visit to her friend fell through.    Patient is accompained by:  Family member    Currently in Pain?  No/denies       OT TREATMENT    Neuro muscular re-education:  Pt. worked on [facility] skills grasping, and manipulating extra small objects. Pt. grasped 1/16" beads and placed them on a small dowel. Pt. Worked on removing them alternating the thumb opposition to the tip of the 2nd through 5th digits. Pt. Worked on grasping 1/8" beads using needle nose tweezers. Pt. Worked on grasping, and manipulating pushpins while placing 2 sheets of paper onto a bulletin board with arms elevated.                          OT Education - 02/07/18 1031    Education provided  Yes    Education Details  [facility]    Person(s) Educated  Patient    Methods  Explanation;Demonstration;Verbal cues    Comprehension  Verbalized understanding;Returned demonstration          OT Long Term Goals - 11/23/17 0935      OT LONG TERM GOAL #1   Title  Patient will demonstrate increased strength in right hand  sufficient to scoop ice cream from frozen container with modified independence.    Baseline  goal update:  patient still able to complete with minimal assist at times.    Time  12    Period  Months    Status  On-going      OT LONG TERM GOAL #2   Title  Patient will be able to make her bed with modified independence.    Baseline  she continues to have difficulty with balance and completing this task, increased time, but no assistance needed. Would like to work towards efficiency in time    Time  6    Period  Months    Status  On-going      OT LONG TERM GOAL #3   Title  Patient will complete back closure bra donning with modified independence.     Baseline  max assist at eval    Time  12    Period  Weeks    Status  New    Target Date  02/17/18      OT LONG TERM GOAL #4   Title  Patient will increase BUE strength by 1 mm  grade to be able to self propel her wheelchair over carpet for distances greater than 300 feet    Baseline  short distance mobility over carpet about 100 feet before fatigue.     Time  12    Period  Weeks    Status  New    Target Date  02/17/18      OT LONG TERM GOAL #5   Title  Patient will demonstrate the ability to perform small buttons on shirts independently and with good speed in order to dress for school.    Baseline  goal update: Patient can perform with increased time but occasionally needs assist if rushed, still difficult at goal update 11/23/2017    Time  12    Period  Weeks    Status  Partially Met      OT LONG TERM GOAL #6   Title  Patient will donn and doff small earrings with modified independence.    Baseline  Goal update:  still able to hold small backs and dropping only 20% of the time, slow to complete but improving     Time  12    Period  Weeks    Status  Partially Met      OT LONG TERM GOAL #7   Title  Patient will be able to zip her rainboots while they are on her feet with modified independence.      Baseline  patient unable to fit into  current boots secondary to new AFOs    Time  12    Period  Weeks    Status  Deferred      OT LONG TERM GOAL #8   Title  Patient to complete am self care demonstrating efficient methods of dressing skills to get to school on time.     Baseline  Goal update:  patient reports it takes about an hour to get dressed, running late one time this week.  Still working on efficiency and speed of tasks    Time  12    Period  Weeks    Status  Partially Met      OT LONG TERM GOAL  #9   Baseline  Patient will don/doff socks and AFO braces with modified independence using adaptive equipment as needed.  Socks and braces are more difficult after showering and if socks are new, otherwise she can do it.     Time  12    Period  Weeks    Status  Partially Met      OT LONG TERM GOAL  #10   TITLE  Patient will demonstrate the ability to pull jeans down over AFOs with modified independence.     Baseline  continues to require min assist with this task 11/23/17    Time  12    Period  Weeks    Status  On-going      OT LONG TERM GOAL  #11   TITLE  Will be able to squeeze tooth paste out of the tube when it is less than half full.    Baseline  difficulty with small travel sized, may look into alternative ways since this has been a chronic issue.    Time  6    Period  Months    Status  Achieved      OT LONG TERM GOAL  #12   TITLE  Patient will demonstrate managing wheelchair rain poncho in inclement weather with modified independence.    Baseline  crutch tips  are difficult for anyone to take them off, goal deferred.    Time  6    Period  Months    Status  Achieved      OT LONG TERM GOAL  #13   TITLE  Patient will be able to complete 2 1/2 minutes straight with left shoulder flexion simulating driving with a stearing wheel ball to be able to drive to college.     Baseline  Patient has not been driving, her family takes her to school and drops her off.     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     Baseline  Continued difficulty coordinating the use of both hands to complete task, speed still an issue 11/23/17    Status  Partially Met      OT LONG TERM GOAL  #15   TITLE  Patient will complete meal preparation with minimal assist.     Baseline  moderate assist 11/23/17    Time  12    Period  Weeks    Status  On-going            Plan - 02/07/18 1032    Clinical Impression Statement  Pt. continues to work on improving UE strength, and coordination skills with emphasis this date on refining [facility] skills to be able to manipulate small objects during ADL tasks, IADL tasks, school related tasks, and home management tasks.    Occupational Profile and client history currently impacting functional performance  decreased hand strength, balance and coordination.    Occupational performance deficits (Please refer to evaluation for details):  ADL's;IADL's;Leisure;Education    Rehab Potential  Good    OT Frequency  1x / week    OT Duration  12 weeks    OT Treatment/Interventions  Self-care/ADL training;DME and/or AE instruction;Therapeutic activities;Therapeutic exercise;Neuromuscular education;Patient/family education    Plan  Occupational Therapy 1 x per week for ADL and strenghtening.    OT Home Exercise Plan  try elastic shoe laces and new tech for bra donning and doffing    Consulted and Agree with Plan of Care  Patient    Family Member Consulted  mom       Patient will benefit from skilled therapeutic intervention in order to improve the following deficits and impairments:  Impaired UE functional use, Difficulty walking, Decreased strength, Decreased coordination, Decreased endurance, Decreased balance  Visit Diagnosis: Other lack of coordination    Problem List There are no active problems to display for  this patient.   Harrel Carina, MS, OTR/L 02/07/2018, 10:46 AM  [place] MAIN [facility] SERVICES [address] [place], [place], [address] Phone: [phone]   Fax:  [phone]  Name: Michelle Randall MRN: [id number] Date of Birth: 03-04-1996

## 2018-02-08 ENCOUNTER — Ambulatory Visit: Payer: Medicaid Other

## 2018-02-08 DIAGNOSIS — R279 Unspecified lack of coordination: Secondary | ICD-10-CM

## 2018-02-08 DIAGNOSIS — R278 Other lack of coordination: Secondary | ICD-10-CM

## 2018-02-08 DIAGNOSIS — M6281 Muscle weakness (generalized): Secondary | ICD-10-CM | POA: Diagnosis not present

## 2018-02-08 DIAGNOSIS — R262 Difficulty in walking, not elsewhere classified: Secondary | ICD-10-CM

## 2018-02-08 NOTE — Therapy (Signed)
[place] MAIN [facility] SERVICES [address] [place], [place], [address] Phone: [phone]   Fax:  [phone]  Physical Therapy Treatment  Patient Details  Name: Michelle Randall MRN: [id number] Date of Birth: [birth year] Referring Provider: Langley Gauss MD   Encounter Date: 02/08/2018  PT End of Session - 02/08/18 1135    Visit Number  [id number]    Number of Visits  134    Date for PT Re-Evaluation  03/28/18    Authorization Type  3/12    Authorization Time Period  12 visits 3/6-5/28    PT Start Time  1128    PT Stop Time  1208    PT Time Calculation (min)  40 min    Equipment Utilized During Treatment  Gait belt    Activity Tolerance  Patient tolerated treatment well;Patient limited by fatigue    Behavior During Therapy  [facility] for tasks assessed/performed       No past medical history on file.  No past surgical history on file.  There were no vitals filed for this visit.  Subjective Assessment - 02/08/18 1131    Subjective  Pt reports she is doing well. She is on spring break this week and enjoying the time off. HEP going well when she has time to between studying and class.     Currently in Pain?  No/denies                No data recorded    11:15 Michelle Randall-21yoF-   -Nustep lvl 3 4 minutes -Supine Thomas Test: 2x60sec -Lateral Side Step and Over: 1x6bilat; progressed from // bars to lofstrand crutches to simulate real-life scenarios; Pt reports improved leg activation.  Alternating toe taps up to 6" step. x20 each LE.  -supine bent knee raise2x20x reciprocal; verbal cues to bring knee toward ipsilateral shoulder rather than chin.  -hooklying clamshell: 2x10, 8-inch roll between feet, no resistance (sufficient resistance from adductors without needing TB)            PT Long Term Goals - 01/11/18 1407      PT LONG TERM GOAL #1   Title  Patient will improve Dynamic Gait Index (DGI) score to > 21/24 for  meaningful improvement and low falls risk regarding dynamic walking tasks (revised from > 19/24 )    Baseline  14/24; 11/02/16: 15/24 01/28/17: 18/24 6/5: 19/24; 08/23/17 14/24,  14/24 10/11/17 14/24 12/20/17, 12/27/17/  14/24    Time  12    Period  Weeks    Status  On-going      PT LONG TERM GOAL #2   Title  Patient will increase Berg Balance score by >51/56 points to be considered a low risk for falls for improved safety. (revised from >6 points improvement)    Baseline  11/02/16: 34/56 01/28/17: 39/56 6/5: 45/56; 08/23/2017 = 45/56, 43/56 10/11/17, 43/56 12/20/17, 12/27/17  43/56    Time  12    Period  Weeks    Status  On-going      PT LONG TERM GOAL #3   Title  Patient will be able to transfer in and out of a large car with  a high seat with CGA  to improve ability to go to school/doctor visits.     Baseline  Patient requires min A for transfer into large SUV unless it has a step rail on the side    Time  12    Period  Weeks  Status  On-going      PT LONG TERM GOAL #4   Title  Patient will complete a TUG test in < 12 seconds for independent mobility and decreased fall risk     Baseline  11.45; 08/23/2017 = 13.66 sec, 13.40 12/20/17, 12/27/17 12.07 sec    Time  12    Period  Weeks    Status  On-going      PT LONG TERM GOAL #5   Title  Patient will improve 6 minute walk distance by > 150 ft for improved return to functional community activities     Baseline  740 01/28/2017: 758f 6/5: 795  730 on 07/19/17; 790 ft on 08/23/18,  740 feet, 730 feet 12/20/17, 12/27/17 650 feet     Time  12    Period  Weeks    Status  On-going      PT LONG TERM GOAL #6   Title  Patient will improve gait speed to > 1.2 m/s with least restrictive assistive device to return to normal walking speed     Baseline  .76 m/h 01/28/2017: .87  07/19/17 0.632m; 0.77 m/s on 08/23/17, . 58 m/sec 10/11/17 .6662mc    Time  12    Period  Weeks    Status  On-going 03/21/18 target dat      PT LONG TERM GOAL #7   Title  Patient (< 60 57ears old) will complete five times sit to stand test in < 10 seconds indicating an increased LE strength and improved balance     Baseline  9.45 sec; 9.63 on 08/23/17, 9.63 sec 12/27/17, 10.07 sec    Time  12    Period  Weeks    Status  Achieved 03/26/18 target date      PT [place]   Title  Patient will be able to ambulate on inclines and grass independenlty with LRAD    Baseline  Can ambulate over grassy inclines with decreased speed and safety requiring need loftstrand curtches and SBA    Time  12    Period  Weeks    Status  On-going 03/21/18 target date      PT LONG TERM GOAL  #9   TITLE  Patient will be abe to transfer from low chair or stool without UE support independently    Baseline  Patient needs min A assist to transfer from a low stool    Time  12    Period  Weeks    Status  Partially Met 03/21/18 target date      PT LONG TERM GOAL  #10   TITLE  Patient will be able to transfer from the floor to standing independently with use of LRAD    Baseline  Patient able to stand from the floor independently if she has not been on the floor longer than about 5 min due to stiffness with prolonged positioning    Time  12    Period  Weeks    Status  On-going 03/21/18 target date      PT LONG TERM GOAL  #11   TITLE  Patient will step up onto a 6" step 5x with AD independently to increase community mobility    Baseline  Pt. has increased difficulty requiring supervision for safety, but no physical assist    Time  12    Period  Weeks    Status  On-going 01/03/18 target date  Plan - 02/08/18 1202    Clinical Impression Statement  Progressed patient from // bar activity to lofstrand to simulate environmental barriers with good tolerance, reporting better BLE involvement, but noted fatigue. Moved toward more core training this session as well as  requested per patient. Pt making good progress overall, remains heavily motivated.      Rehab Potential  Good    Clinical  Impairments Affecting Rehab Potential  weakness and decreased standing balance    PT Frequency  1x / week    PT Duration  12 weeks    PT Treatment/Interventions  Therapeutic exercise;Therapeutic activities;Gait training;Balance training;Stair training;DME Instruction;Neuromuscular re-education;Patient/family education    PT Next Visit Plan  Continue POC    PT Home Exercise Plan  added seated dynamic core UE and LE lift offs with visual cues using mirror, supine marches       Patient will benefit from skilled therapeutic intervention in order to improve the following deficits and impairments:  Abnormal gait, Decreased balance, Decreased endurance, Difficulty walking, Decreased strength  Visit Diagnosis: Other lack of coordination  Muscle weakness (generalized)  Lack of coordination  Difficulty in walking, not elsewhere classified     Problem List There are no active problems to display for this patient.  12:16 PM, 02/08/18 Etta Grandchild, PT, DPT Physical Therapist - [facility]  [phone] ([place])    [place] C 02/08/2018, 12:12 PM  [place] MAIN [facility] SERVICES [address] [place], [place], [address] Phone: [phone]   Fax:  [phone]  Name: Michelle Randall MRN: [id number] Date of Birth: 1996-09-26

## 2018-02-15 ENCOUNTER — Ambulatory Visit: Payer: Medicaid Other | Admitting: Occupational Therapy

## 2018-02-15 ENCOUNTER — Ambulatory Visit: Payer: Medicaid Other | Attending: Student

## 2018-02-15 DIAGNOSIS — R279 Unspecified lack of coordination: Secondary | ICD-10-CM | POA: Insufficient documentation

## 2018-02-15 DIAGNOSIS — R262 Difficulty in walking, not elsewhere classified: Secondary | ICD-10-CM | POA: Insufficient documentation

## 2018-02-15 DIAGNOSIS — R278 Other lack of coordination: Secondary | ICD-10-CM | POA: Insufficient documentation

## 2018-02-15 DIAGNOSIS — M6281 Muscle weakness (generalized): Secondary | ICD-10-CM | POA: Insufficient documentation

## 2018-02-22 ENCOUNTER — Ambulatory Visit: Payer: Medicaid Other

## 2018-02-22 ENCOUNTER — Ambulatory Visit: Payer: Medicaid Other | Admitting: Occupational Therapy

## 2018-02-22 DIAGNOSIS — R278 Other lack of coordination: Secondary | ICD-10-CM

## 2018-02-22 DIAGNOSIS — R262 Difficulty in walking, not elsewhere classified: Secondary | ICD-10-CM

## 2018-02-22 DIAGNOSIS — M6281 Muscle weakness (generalized): Secondary | ICD-10-CM

## 2018-02-22 DIAGNOSIS — R279 Unspecified lack of coordination: Secondary | ICD-10-CM

## 2018-02-22 NOTE — Therapy (Signed)
[place] MAIN [facility] SERVICES [address] [place], [place], [address] Phone: [phone]   Fax:  [phone]  Physical Therapy Treatment  Patient Details  Name: Michelle Randall MRN: [id number] Date of Birth: 28-Apr-1996 Referring Provider: Langley Gauss MD   Encounter Date: 02/22/2018  PT End of Session - 02/22/18 1005    Visit Number  124    Number of Visits  134    Date for PT Re-Evaluation  03/28/18    Authorization Type  4/12    Authorization Time Period  12 visits 3/6-5/28    PT Start Time  1015    PT Stop Time  1059    PT Time Calculation (min)  44 min    Equipment Utilized During Treatment  Gait belt    Activity Tolerance  Patient tolerated treatment well;Patient limited by fatigue    Behavior During Therapy  [facility] for tasks assessed/performed       History reviewed. No pertinent past medical history.  History reviewed. No pertinent surgical history.  There were no vitals filed for this visit.  Subjective Assessment - 02/22/18 1016    Subjective  Patient reports doing well. Compliant with HEP. No complaints or concerns at this time.     Patient is accompained by:  Family member    Limitations  Walking    Patient Stated Goals  Patient wants to improve her core strength.     Currently in Pain?  No/denies       TREATMENT    Alternating toe taps up to 8" step. x20 each LE.    Step ups to 8" step. x20. Cues for upright posture as pt has tendency to demonstrate flexed posture, especially when stepping up.    Side stepping up and over 8" step x10 each direction. Cues to avoid circumduction but rather to perform hip flexion.    Ambulating around 6 cones with focus on placement of LE's and negotiation of obstacles   LAQ with 5# ankle weights x20 each LE with cues to avoid compensatory trunk lean with R LAQ.    Prone hip flexor stretch with towel under knee x2 x 1.5 minutes each leg    Rhomberg stance on airex and ball  toss to basket 10 ft away                          PT Education - 02/22/18 1004    Education provided  Yes    Education Details  functional mobility, exercise technique     Person(s) Educated  Patient    Methods  Explanation;Demonstration;Verbal cues    Comprehension  Verbalized understanding;Returned demonstration          PT Long Term Goals - 01/11/18 1407      PT LONG TERM GOAL #1   Title  Patient will improve Dynamic Gait Index (DGI) score to > 21/24 for meaningful improvement and low falls risk regarding dynamic walking tasks (revised from > 19/24 )    Baseline  14/24; 11/02/16: 15/24 01/28/17: 18/24 6/5: 19/24; 08/23/17 14/24,  14/24 10/11/17 14/24 12/20/17, 12/27/17/  14/24    Time  12    Period  Weeks    Status  On-going      PT LONG TERM GOAL #2   Title  Patient will increase Berg Balance score by >51/56 points to be considered a low risk for falls for improved safety. (revised from >6 points improvement)  Baseline  11/02/16: 34/56 01/28/17: 39/56 6/5: 45/56; 08/23/2017 = 45/56, 43/56 10/11/17, 43/56 12/20/17, 12/27/17  43/56    Time  12    Period  Weeks    Status  On-going      PT LONG TERM GOAL #3   Title  Patient will be able to transfer in and out of a large car with  a high seat with CGA  to improve ability to go to school/doctor visits.     Baseline  Patient requires min A for transfer into large SUV unless it has a step rail on the side    Time  12    Period  Weeks    Status  On-going      PT LONG TERM GOAL #4   Title  Patient will complete a TUG test in < 12 seconds for independent mobility and decreased fall risk     Baseline  11.45; 08/23/2017 = 13.66 sec, 13.40 12/20/17, 12/27/17 12.07 sec    Time  12    Period  Weeks    Status  On-going      PT LONG TERM GOAL #5   Title  Patient will improve 6 minute walk distance by > 150 ft for improved return to functional community activities     Baseline  740 01/28/2017: 760f 6/5: 795  730 on 07/19/17;  790 ft on 08/23/18,  740 feet, 730 feet 12/20/17, 12/27/17 650 feet     Time  12    Period  Weeks    Status  On-going      PT LONG TERM GOAL #6   Title  Patient will improve gait speed to > 1.2 m/s with least restrictive assistive device to return to normal walking speed     Baseline  .76 m/h 01/28/2017: .87  07/19/17 0.619m; 0.77 m/s on 08/23/17, . 58 m/sec 10/11/17 .6634mc    Time  12    Period  Weeks    Status  On-going 03/21/18 target dat      PT LONG TERM GOAL #7   Title  Patient (< 60 34ars old) will complete five times sit to stand test in < 10 seconds indicating an increased LE strength and improved balance     Baseline  9.45 sec; 9.63 on 08/23/17, 9.63 sec 12/27/17, 10.07 sec    Time  12    Period  Weeks    Status  Achieved 03/26/18 target date      PT LONG TERM GOAL #8   Title  Patient will be able to ambulate on inclines and grass independenlty with LRAD    Baseline  Can ambulate over grassy inclines with decreased speed and safety requiring need loftstrand curtches and SBA    Time  12    Period  Weeks    Status  On-going 03/21/18 target date      PT LONG TERM GOAL  #9   TITLE  Patient will be abe to transfer from low chair or stool without UE support independently    Baseline  Patient needs min A assist to transfer from a low stool    Time  12    Period  Weeks    Status  Partially Met 03/21/18 target date      PT LONG TERM GOAL  #10   TITLE  Patient will be able to transfer from the floor to standing independently with use of LRAD    Baseline  Patient able to stand from the floor independently  if she has not been on the floor longer than about 5 min due to stiffness with prolonged positioning    Time  12    Period  Weeks    Status  On-going 03/21/18 target date      PT LONG TERM GOAL  #11   TITLE  Patient will step up onto a 6" step 5x with AD independently to increase community mobility    Baseline  Pt. has increased difficulty requiring supervision for safety, but no physical  assist    Time  12    Period  Weeks    Status  On-going 01/03/18 target date            Plan - 02/22/18 1025    Clinical Impression Statement  Patient demonstrates trunk flexion when attempting to perform hip flexion. Increased external rotation of LE's noted with fatigue. Improved quality of movement noted compared to previous session. Patient will continue to benefit from skilled physical therapy for improved gait mechanics, strength, and balance.    Rehab Potential  Good    Clinical Impairments Affecting Rehab Potential  weakness and decreased standing balance    PT Frequency  1x / week    PT Duration  12 weeks    PT Treatment/Interventions  Therapeutic exercise;Therapeutic activities;Gait training;Balance training;Stair training;DME Instruction;Neuromuscular re-education;Patient/family education    PT Next Visit Plan  Continue POC    PT Home Exercise Plan  added seated dynamic core UE and LE lift offs with visual cues using mirror, supine marches       Patient will benefit from skilled therapeutic intervention in order to improve the following deficits and impairments:  Abnormal gait, Decreased balance, Decreased endurance, Difficulty walking, Decreased strength  Visit Diagnosis: Other lack of coordination  Muscle weakness (generalized)  Lack of coordination  Difficulty in walking, not elsewhere classified     Problem List There are no active problems to display for this patient.  Janna Arch, PT, DPT   Janna Arch 02/22/2018, 11:00 AM  [place] MAIN [facility] SERVICES [address] [place], [place], [address] Phone: [phone]   Fax:  [phone]  Name: Michelle Randall MRN: [id number] Date of Birth: [birth year]

## 2018-02-25 ENCOUNTER — Encounter: Payer: Self-pay | Admitting: Occupational Therapy

## 2018-02-25 NOTE — Therapy (Addendum)
[place] MAIN [facility] SERVICES [address] [place], [place], [address] Phone: [phone]   Fax:  [phone]  Occupational Therapy Treatment/Progress Update  Patient Details  Name: Michelle Randall MRN: [id number] Date of Birth: [birth year] Referring Provider: Langley Gauss MD   Encounter Date: 02/22/2018  OT End of Session - 02/25/18 1826    Visit Number  115    Number of Visits  130    OT Start Time  0930    OT Stop Time  1015    OT Time Calculation (min)  45 min    Activity Tolerance  Patient tolerated treatment well    Behavior During Therapy  [facility] for tasks assessed/performed       History reviewed. No pertinent past medical history.  History reviewed. No pertinent surgical history.  There were no vitals filed for this visit.  Subjective Assessment - 02/25/18 1824    Subjective   Patient reports she is hoping to get her bathroom remodeled soon since she has outgrown the current set up.  Patient also reports she is still planning to go on a trip to [place] with her school but will need to be independent with her self care since she will not have assistance while she is gone.    Patient Stated Goals  To be as independent as possible at home and at school.     Currently in Pain?  No/denies    Pain Score  0-No pain       Patient seen for reassessment of skills this date as follows:  Grip strength right 44#, left 42#.  3 point pinch 16#, left 16#, 2 point pinch right 10#, left 9#.   9 hole peg test right 19 secs., left 24 sec.   ADL status:  Patient has outgrown her current bathroom setup, she has grown and can no longer utlize her shower bench, it is too small and the surface is slick therefore she has slid off of the seat several times.  Mom has to assist her with getting in and out of the shower providing minimal assist.  Mom also helps to dry off since after the shower the area is slippery.  She does sit to dry off.  She can  perform most of her basic self care with dressing and grooming but takes her increased time to complete.  Occasional assist with bra and with brace if she is on a time constraint.  She has difficulty with self propelling her wheelchair and needs to be fully independent by June 2019 since she will be traveling alone to [place] for a trip with the university.    Pt seen this date for UB strengthening with 2# weight, performing right and left for shoulder flexion, ABD, ADD, chest press, forwards and backwards circles.  Patient seen for reaching tasks with shape tower and performing all 4 levels, cues for weight shifting and reaching to top level, performed on each side. Patient seen for translatory movements of the hands with use of long dowel stick, cues for technique.  Resistive pinch performed with all levels of pins from yellow to black and placing on elevated surface.                       OT Education - 02/25/18 1825    Education provided  Yes    Education Details  POC, goals, self care    Person(s) Educated  Patient    Methods  Explanation;Demonstration;Verbal cues    Comprehension  Verbal cues required;Returned demonstration;Verbalized understanding          OT Long Term Goals - 02/25/18 1827      OT LONG TERM GOAL #1   Title  Patient will demonstrate increased strength in right hand sufficient to scoop ice cream from frozen container with modified independence.    Baseline  goal update:  patient still able to complete 80% of time, occasional assist depending on time of the day and level of fatigue.   Time  12    Period  Months    Status  On-going      OT LONG TERM GOAL #2   Title  Patient will be able to make her bed with modified independence.    Baseline  she continues to have difficulty with balance and completing this task and needs to continue to work towards efficiency in time    Time  6    Period  Months    Status  On-going      OT LONG TERM GOAL #3    Title  Patient will complete back closure bra donning with modified independence.     Baseline  able to complete unless on a time constraint   Time  12    Period  Weeks    Status  Ongoing     OT LONG TERM GOAL #4   Title  Patient will increase BUE strength by 1 mm grade to be able to self propel her wheelchair over carpet for distances greater than 300 feet    Baseline  short distance mobility over carpet about 150 feet before fatigue.     Time  12    Period  Weeks    Status  ongoing     OT LONG TERM GOAL #5   Title  Patient will demonstrate the ability to perform small buttons on shirts independently and with good speed in order to dress for school.    Baseline  goal update: Patient requires occasional assistance, one of 5 days this week   Time  12    Period  Weeks    Status  Partially Met      OT LONG TERM GOAL #6   Title  Patient will donn and doff small earrings with modified independence.    Baseline  Goal update:  still able to hold small backs and dropping only 20% of the time, slow to complete but improving     Time  12    Period  Weeks    Status  Partially Met      OT LONG TERM GOAL #7   Title  Patient will be able to zip her rainboots while they are on her feet with modified independence.      Baseline  patient unable to fit into current boots secondary to new AFOs    Time  12    Period  Weeks    Status  Deferred      OT LONG TERM GOAL #8   Title  Patient to complete am self care demonstrating efficient methods of dressing skills to get to school on time.     Baseline  Goal update:  patient reports it takes about an hour to get dressed, running late one time this week.  Still working on efficiency and speed of tasks    Time  12    Period  Weeks    Status  Partially Met  OT LONG TERM GOAL  #9   Baseline  Patient will don/doff socks and AFO braces with modified independence using adaptive equipment as needed.  Socks and braces are more difficult after showering  and if socks are new, otherwise she can do it.     Time  12    Period  Weeks    Status  Partially Met      OT LONG TERM GOAL  #10   TITLE  Patient will demonstrate the ability to pull jeans down over AFOs with modified independence.     Baseline  Able to complete most days, occasional assist depending on the type of pants   Time  12    Period  Weeks    Status  On-going      OT LONG TERM GOAL  #11   TITLE  Will be able to squeeze tooth paste out of the tube when it is less than half full.    Baseline  difficulty with small travel sized, may look into alternative ways since this has been a chronic issue.    Time  6    Period  Months    Status  Achieved      OT LONG TERM GOAL  #12   TITLE  Patient will demonstrate managing wheelchair rain poncho in inclement weather with modified independence.    Baseline  crutch tips are difficult for anyone to take them off, goal deferred.    Time  6    Period  Months    Status  Achieved      OT LONG TERM GOAL  #13   TITLE  Patient will be able to complete 2 1/2 minutes straight with left shoulder flexion simulating driving with a stearing wheel ball to be able to drive to college.     Baseline  Patient has not been driving, her family takes her to school and drops her off.     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     Baseline Goal update:  Working on speed of completion   Status  Partially [place]  #15   TITLE  Patient will complete meal preparation with minimal assist.     Baseline Can complete simple tasks with min assist, more complex meal prep requires mod assist   Time  12    Period  Weeks    Status  On-going            Plan - 02/25/18 1826    Clinical Impression Statement  Pt. continues to work on improving UE strength,  and coordination skills with emphasis this date on refining [facility] skills to be able to manipulate small objects during ADL tasks, IADL tasks, school related tasks, and home management tasks. She has progressed with the speed of completing tasks but still has occasional days in which she is on a time constraint to get to class and needs assistance.  Patient continues to demonstrate decreased strength in BUE which affect her ability to self propel longer distances in her wheelchair.  She also continues to demonstrate decreased balance with tasks in standing which affects her ability to perform self care tasks  especially with showering, mom currently has to assist at home.  She has outgrown her current bathroom setup at home and working towards getting her bathroom renovated to accommodate the change in her size from child to adult.  Recommendations include widening the doorway to the bathroom for access with mobility devices, zero entry shower with non skid flooring and built in bench with non skid covering for safety while seated to bathe, grab bars in shower and by the toilet, a hand held shower to control the water spray as well as a surface to put her shower supplies and clothing so she can reach these items from a seated position.  Patient continues to benefit from skilled OT to maximize her safety and independence in necessary daily tasks and is working towards being independent by June of this year in order to travel abroad to [place] for a part of a [employer] course.     Occupational Profile and client history currently impacting functional performance  decreased hand strength, balance and coordination.    Occupational performance deficits (Please refer to evaluation for details):  ADL's;IADL's;Leisure;Education    Rehab Potential  Good    OT Frequency  1x / week    OT Duration  12 weeks    OT Treatment/Interventions  Self-care/ADL training;DME and/or AE instruction;Therapeutic  activities;Therapeutic exercise;Neuromuscular education;Patient/family education    Consulted and Agree with Plan of Care  Patient       Patient will benefit from skilled therapeutic intervention in order to improve the following deficits and impairments:  Impaired UE functional use, Difficulty walking, Decreased strength, Decreased coordination, Decreased endurance, Decreased balance  Visit Diagnosis: Muscle weakness (generalized)  Other lack of coordination    Problem List There are no active problems to display for this patient.  Achilles Dunk, OTR/L, CLT  Lovett,Amy 02/25/2018, 9:13 PM  [place] MAIN [facility] SERVICES [address] [place], [place], [address] Phone: [phone]   Fax:  [phone]  Name: JAYLENE ARROWOOD MRN: [id number] Date of Birth: [birth year]

## 2018-03-01 ENCOUNTER — Encounter: Payer: Self-pay | Admitting: Occupational Therapy

## 2018-03-01 ENCOUNTER — Ambulatory Visit: Payer: Medicaid Other | Admitting: Occupational Therapy

## 2018-03-01 ENCOUNTER — Ambulatory Visit: Payer: Medicaid Other

## 2018-03-01 ENCOUNTER — Other Ambulatory Visit: Payer: Self-pay

## 2018-03-01 DIAGNOSIS — M6281 Muscle weakness (generalized): Secondary | ICD-10-CM | POA: Diagnosis not present

## 2018-03-01 DIAGNOSIS — R279 Unspecified lack of coordination: Secondary | ICD-10-CM

## 2018-03-01 DIAGNOSIS — R278 Other lack of coordination: Secondary | ICD-10-CM

## 2018-03-01 DIAGNOSIS — R262 Difficulty in walking, not elsewhere classified: Secondary | ICD-10-CM

## 2018-03-01 NOTE — Therapy (Signed)
[place] MAIN [facility] SERVICES [address] [place], [place], [address] Phone: [phone]   Fax:  [phone]  Physical Therapy Treatment  Patient Details  Name: Michelle Randall MRN: [id number] Date of Birth: 22-Jun-1996 Referring Provider: Langley Gauss MD   Encounter Date: 03/01/2018  PT End of Session - 03/01/18 1106    Visit Number  125    Number of Visits  134    Date for PT Re-Evaluation  03/28/18    Authorization Type  5/12    Authorization Time Period  12 visits 3/6-5/28    PT Start Time  1018    PT Stop Time  1101    PT Time Calculation (min)  43 min    Equipment Utilized During Treatment  Gait belt    Activity Tolerance  Patient tolerated treatment well;Patient limited by fatigue    Behavior During Therapy  [facility] for tasks assessed/performed       History reviewed. No pertinent past medical history.  History reviewed. No pertinent surgical history.  There were no vitals filed for this visit.  Subjective Assessment - 03/01/18 1022    Subjective  No complaints or concerns at this time. Has been compliant with HEP. no stumbles or falls. Did the 24 hour dance marathon last week.    Patient is accompained by:  Family member    Limitations  Walking    Patient Stated Goals  Patient wants to improve her core strength.     Currently in Pain?  No/denies        TREATMENT   Speed ladder, one foot in each box in // bars 10x   Step ups to 8"step. x20. Cues for upright posture as pt has tendency to demonstrate flexed posture, especially when stepping up.   Side stepping up and over 8"step x10 each direction. Cues to avoid circumduction but rather to perform hip flexion.   Ambulating around 6 cones with focus on placement of LE's and negotiation of obstacles   Supine: single leg heel slides to promote reciprocal motion ; occasional tactile cueing upon fatigue. 10x each leg   Prone hip flexor stretch with towel under  knee x2 x 2 minutes each leg   Pt. response to medical necessity: Patient will continue to benefit from skilled physical therapy for improved gait mechanics, strength, and balance.                          PT Education - 03/01/18 1105    Education provided  Yes    Education Details  exercise technique, larger step length, reciprocal motion     Person(s) Educated  Patient    Methods  Explanation;Demonstration;Tactile cues;Verbal cues    Comprehension  Verbalized understanding;Returned demonstration;Tactile cues required          PT Long Term Goals - 01/11/18 1407      PT LONG TERM GOAL #1   Title  Patient will improve Dynamic Gait Index (DGI) score to > 21/24 for meaningful improvement and low falls risk regarding dynamic walking tasks (revised from > 19/24 )    Baseline  14/24; 11/02/16: 15/24 01/28/17: 18/24 6/5: 19/24; 08/23/17 14/24,  14/24 10/11/17 14/24 12/20/17, 12/27/17/  14/24    Time  12    Period  Weeks    Status  On-going      PT LONG TERM GOAL #2   Title  Patient will increase Berg Balance score by >51/56  points to be considered a low risk for falls for improved safety. (revised from >6 points improvement)    Baseline  11/02/16: 34/56 01/28/17: 39/56 6/5: 45/56; 08/23/2017 = 45/56, 43/56 10/11/17, 43/56 12/20/17, 12/27/17  43/56    Time  12    Period  Weeks    Status  On-going      PT LONG TERM GOAL #3   Title  Patient will be able to transfer in and out of a large car with  a high seat with CGA  to improve ability to go to school/doctor visits.     Baseline  Patient requires min A for transfer into large SUV unless it has a step rail on the side    Time  12    Period  Weeks    Status  On-going      PT LONG TERM GOAL #4   Title  Patient will complete a TUG test in < 12 seconds for independent mobility and decreased fall risk     Baseline  11.45; 08/23/2017 = 13.66 sec, 13.40 12/20/17, 12/27/17 12.07 sec    Time  12    Period  Weeks    Status   On-going      PT LONG TERM GOAL #5   Title  Patient will improve 6 minute walk distance by > 150 ft for improved return to functional community activities     Baseline  740 01/28/2017: 71f 6/5: 795  730 on 07/19/17; 790 ft on 08/23/18,  740 feet, 730 feet 12/20/17, 12/27/17 650 feet     Time  12    Period  Weeks    Status  On-going      PT LONG TERM GOAL #6   Title  Patient will improve gait speed to > 1.2 m/s with least restrictive assistive device to return to normal walking speed     Baseline  .76 m/h 01/28/2017: .87  07/19/17 0.672m; 0.77 m/s on 08/23/17, . 58 m/sec 10/11/17 .6660mc    Time  12    Period  Weeks    Status  On-going 03/21/18 target dat      PT LONG TERM GOAL #7   Title  Patient (< 60 30ars old) will complete five times sit to stand test in < 10 seconds indicating an increased LE strength and improved balance     Baseline  9.45 sec; 9.63 on 08/23/17, 9.63 sec 12/27/17, 10.07 sec    Time  12    Period  Weeks    Status  Achieved 03/26/18 target date      [place]   Title  Patient will be able to ambulate on inclines and grass independenlty with LRAD    Baseline  Can ambulate over grassy inclines with decreased speed and safety requiring need loftstrand curtches and SBA    Time  12    Period  Weeks    Status  On-going 03/21/18 target date      PT LONG TERM GOAL  #9   TITLE  Patient will be abe to transfer from low chair or stool without UE support independently    Baseline  Patient needs min A assist to transfer from a low stool    Time  12    Period  Weeks    Status  Partially Met 03/21/18 target date      PT LONG TERM GOAL  #10   TITLE  Patient will be able to transfer from the floor  to standing independently with use of LRAD    Baseline  Patient able to stand from the floor independently if she has not been on the floor longer than about 5 min due to stiffness with prolonged positioning    Time  12    Period  Weeks    Status  On-going 03/21/18 target date       PT LONG TERM GOAL  #11   TITLE  Patient will step up onto a 6" step 5x with AD independently to increase community mobility    Baseline  Pt. has increased difficulty requiring supervision for safety, but no physical assist    Time  12    Period  Weeks    Status  On-going 01/03/18 target date            Plan - 03/01/18 1107    Clinical Impression Statement  Patient performed interventions focusing on larger step length with carryover into ambulation outside of bars. Reciprocal motion is challenging to patient upon fatigue however able to perform with occasional tactile cueing. Patient will continue to benefit from skilled physical therapy for improved gait mechanics, strength, and balance.     Rehab Potential  Good    Clinical Impairments Affecting Rehab Potential  weakness and decreased standing balance    PT Frequency  1x / week    PT Duration  12 weeks    PT Treatment/Interventions  Therapeutic exercise;Therapeutic activities;Gait training;Balance training;Stair training;DME Instruction;Neuromuscular re-education;Patient/family education    PT Next Visit Plan  Continue POC    PT Home Exercise Plan  added seated dynamic core UE and LE lift offs with visual cues using mirror, supine marches       Patient will benefit from skilled therapeutic intervention in order to improve the following deficits and impairments:  Abnormal gait, Decreased balance, Decreased endurance, Difficulty walking, Decreased strength  Visit Diagnosis: Muscle weakness (generalized)  Other lack of coordination  Lack of coordination  Difficulty in walking, not elsewhere classified     Problem List There are no active problems to display for this patient.  Janna Randall, PT, DPT   03/01/2018, 11:07 AM  [place] MAIN [facility] SERVICES [address] [place], [place], [address] Phone: [phone]   Fax:  [phone]  Name: Michelle Randall MRN: [id number] Date of  Birth: 04-Jul-1996

## 2018-03-01 NOTE — Therapy (Signed)
[place] MAIN [facility] SERVICES [address] [place], [place], [address] Phone: [phone]   Fax:  [phone]  Occupational Therapy Treatment  Patient Details  Name: Michelle Randall MRN: [id number] Date of Birth: 12/02/1995 Referring Provider: Langley Gauss MD   Encounter Date: 03/01/2018  OT End of Session - 03/01/18 1003    Visit Number  116    Number of Visits  130    Date for OT Re-Evaluation  02/17/18    Authorization Type  medicaid visit 49 of 40    OT Start Time  0934    OT Stop Time  1015    OT Time Calculation (min)  41 min    Equipment Utilized During Treatment  lemon press    Activity Tolerance  Patient tolerated treatment well    Behavior During Therapy  [facility] for tasks assessed/performed       History reviewed. No pertinent past medical history.  History reviewed. No pertinent surgical history.  There were no vitals filed for this visit.  Subjective Assessment - 03/01/18 0947    Subjective   Pt. reports she plans to go to [place] this summer.    Patient is accompained by:  Family member    Patient Stated Goals  To be as independent as possible at home and at school.     Currently in Pain?  No/denies                           OT Education - 03/01/18 1002    Education provided  Yes    Education Details  POC, [place], hand strength, and hand function    Person(s) Educated  Patient    Methods  Explanation;Demonstration;Verbal cues    Comprehension  Verbalized understanding;Returned demonstration;Verbal cues required          OT Long Term Goals - 02/25/18 1827      OT LONG TERM GOAL #1   Title  Patient will demonstrate increased strength in right hand sufficient to scoop ice cream from frozen container with modified independence.    Baseline  goal update:  patient still able to complete with minimal assist at times.    Time  12    Period  Months    Status  On-going      OT LONG TERM GOAL  #2   Title  Patient will be able to make her bed with modified independence.    Baseline  she continues to have difficulty with balance and completing this task, increased time, but no assistance needed. Would like to work towards efficiency in time    Time  6    Period  Months    Status  On-going      OT LONG TERM GOAL #3   Title  Patient will complete back closure bra donning with modified independence.     Baseline  max assist at eval    Time  12    Period  Weeks    Status  New      OT LONG TERM GOAL #4   Title  Patient will increase BUE strength by 1 mm grade to be able to self propel her wheelchair over carpet for distances greater than 300 feet    Baseline  short distance mobility over carpet about 100 feet before fatigue.     Time  12    Period  Weeks    Status  New  OT LONG TERM GOAL #5   Title  Patient will demonstrate the ability to perform small buttons on shirts independently and with good speed in order to dress for school.    Baseline  goal update: Patient can perform with increased time but occasionally needs assist if rushed, still difficult at goal update 11/23/2017    Time  12    Period  Weeks    Status  Partially Met      OT LONG TERM GOAL #6   Title  Patient will donn and doff small earrings with modified independence.    Baseline  Goal update:  still able to hold small backs and dropping only 20% of the time, slow to complete but improving     Time  12    Period  Weeks    Status  Partially Met      OT LONG TERM GOAL #7   Title  Patient will be able to zip her rainboots while they are on her feet with modified independence.      Baseline  patient unable to fit into current boots secondary to new AFOs    Time  12    Period  Weeks    Status  Deferred      OT LONG TERM GOAL #8   Title  Patient to complete am self care demonstrating efficient methods of dressing skills to get to school on time.     Baseline  Goal update:  patient reports it takes about an  hour to get dressed, running late one time this week.  Still working on efficiency and speed of tasks    Time  12    Period  Weeks    Status  Partially Met      OT LONG TERM GOAL  #9   Baseline  Patient will don/doff socks and AFO braces with modified independence using adaptive equipment as needed.  Socks and braces are more difficult after showering and if socks are new, otherwise she can do it.     Time  12    Period  Weeks    Status  Partially Met      OT LONG TERM GOAL  #10   TITLE  Patient will demonstrate the ability to pull jeans down over AFOs with modified independence.     Baseline  continues to require min assist with this task 11/23/17    Time  12    Period  Weeks    Status  On-going      OT LONG TERM GOAL  #11   TITLE  Will be able to squeeze tooth paste out of the tube when it is less than half full.    Baseline  difficulty with small travel sized, may look into alternative ways since this has been a chronic issue.    Time  6    Period  Months    Status  Achieved      OT LONG TERM GOAL  #12   TITLE  Patient will demonstrate managing wheelchair rain poncho in inclement weather with modified independence.    Baseline  crutch tips are difficult for anyone to take them off, goal deferred.    Time  6    Period  Months    Status  Achieved      OT LONG TERM GOAL  #13   TITLE  Patient will be able to complete 2 1/2 minutes straight with left shoulder flexion simulating driving with a stearing wheel  ball to be able to drive to college.     Baseline  Patient has not been driving, her family takes her to school and drops her off.     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     Baseline  Continued difficulty coordinating the use of both hands to complete task, speed still an issue 11/23/17     Status  Partially Met      OT LONG TERM GOAL  #15   TITLE  Patient will complete meal preparation with minimal assist.     Baseline  moderate assist 11/23/17    Time  12    Period  Weeks    Status  On-going            Plan - 03/01/18 0959    Clinical Impression Statement  Pt. reports she plans to have her bathroom totally renovated, and a van modified for greater accessibility and independence. Pt. reports that both need to be completed by her 22st birthday in September.  Pt. continues to work on improving UE strength, and coordination skills. for improved use during ADLs, and IADLs.     Occupational Profile and client history currently impacting functional performance  decreased hand strength, balance and coordination.    Occupational performance deficits (Please refer to evaluation for details):  ADL's;IADL's;Leisure;Education    Rehab Potential  Good    OT Frequency  1x / week    OT Duration  12 weeks    OT Treatment/Interventions  Self-care/ADL training;DME and/or AE instruction;Therapeutic activities;Therapeutic exercise;Neuromuscular education;Patient/family education    Plan  Occupational Therapy 1 x per week for ADL and strenghtening.    OT Home Exercise Plan  try elastic shoe laces and new tech for bra donning and doffing    Consulted and Agree with Plan of Care  Patient       Patient will benefit from skilled therapeutic intervention in order to improve the following deficits and impairments:  Impaired UE functional use, Difficulty walking, Decreased strength, Decreased coordination, Decreased endurance, Decreased balance  Visit Diagnosis: Muscle weakness (generalized)  Other lack of coordination    Problem List There are no active problems to display for this patient.   Harrel Carina, MS, OTR/L 03/01/2018, 10:10 AM  [place] MAIN [facility] SERVICES [place], [place], [address] Phone: [phone]   Fax:   [phone]  Name: Michelle Randall MRN: [id number] Date of Birth: August 19, 1996

## 2018-03-01 NOTE — Therapy (Signed)
[place] MAIN [facility] SERVICES [address] [place], [place], [address] Phone: [phone]   Fax:  [phone]  Occupational Therapy Treatment  Patient Details  Name: MYSTIE ORMAND MRN: [id number] Date of Birth: February 21, 1996 Referring Provider: Langley Gauss MD   Encounter Date: 03/01/2018  OT End of Session - 03/01/18 1003    Visit Number  116    Number of Visits  130    Date for OT Re-Evaluation  02/17/18    Authorization Type  medicaid visit 65 of 29    OT Start Time  0934    OT Stop Time  1015    OT Time Calculation (min)  41 min    Equipment Utilized During Treatment  lemon press    Activity Tolerance  Patient tolerated treatment well    Behavior During Therapy  [facility] for tasks assessed/performed       History reviewed. No pertinent past medical history.  History reviewed. No pertinent surgical history.  There were no vitals filed for this visit.  Subjective Assessment - 03/01/18 0947    Subjective   Pt. reports she plans to go to [place] this summer.    Patient is accompained by:  Family member    Patient Stated Goals  To be as independent as possible at home and at school.     Currently in Pain?  No/denies       OT TREATMENT    Neuro muscular re-education:  Pt. worked on grasping 1" resistive cubes alternating thumb opposition to the tip of the 2nd through 5th digits while the board is placed flat at the tabletop. Pt. worked on pressing the cubes back into place while alternating isolated 2nd through 5th digit extension.  Therapeutic Exercise:  Pt. Worked on hand strengthening using Putty[employer]ize  tools with green theraband using the knob turn, cap turn, peg turn, and key turn attachments to target specific components of movements in the hand. Pt. Worked with the knobturn attachments to work to focus on standard gripping, and weightbearing push. Pt. Worked on using the cap turn attachment for standard turning, intrinsic  turning tip turning, thumb opposition, and [employer] turn. Pt. worked on using the peg turn attachment to for standard turning using the digits, and thumb together, tip turning alternating the thumb tip to each digit individually, FDL pushing, and holding the peg while pulling. Pt. worked on using the key turn attachment for standard turning, lateral pinch turn, intrinsic plus push, and lateral pinch pull.                          OT Education - 03/01/18 1002    Education provided  Yes    Education Details  POC, [place], hand strength, and hand function    Person(s) Educated  Patient    Methods  Explanation;Demonstration;Verbal cues    Comprehension  Verbalized understanding;Returned demonstration;Verbal cues required          OT Long Term Goals - 02/25/18 1827      OT LONG TERM GOAL #1   Title  Patient will demonstrate increased strength in right hand sufficient to scoop ice cream from frozen container with modified independence.    Baseline  goal update:  patient still able to complete with minimal assist at times.    Time  12    Period  Months    Status  On-going      OT LONG TERM GOAL #2  Title  Patient will be able to make her bed with modified independence.    Baseline  she continues to have difficulty with balance and completing this task, increased time, but no assistance needed. Would like to work towards efficiency in time    Time  6    Period  Months    Status  On-going      OT LONG TERM GOAL #3   Title  Patient will complete back closure bra donning with modified independence.     Baseline  max assist at eval    Time  12    Period  Weeks    Status  New      OT LONG TERM GOAL #4   Title  Patient will increase BUE strength by 1 mm grade to be able to self propel her wheelchair over carpet for distances greater than 300 feet    Baseline  short distance mobility over carpet about 100 feet before fatigue.     Time  12    Period  Weeks    Status  New       OT LONG TERM GOAL #5   Title  Patient will demonstrate the ability to perform small buttons on shirts independently and with good speed in order to dress for school.    Baseline  goal update: Patient can perform with increased time but occasionally needs assist if rushed, still difficult at goal update 11/23/2017    Time  12    Period  Weeks    Status  Partially Met      OT LONG TERM GOAL #6   Title  Patient will donn and doff small earrings with modified independence.    Baseline  Goal update:  still able to hold small backs and dropping only 20% of the time, slow to complete but improving     Time  12    Period  Weeks    Status  Partially Met      OT LONG TERM GOAL #7   Title  Patient will be able to zip her rainboots while they are on her feet with modified independence.      Baseline  patient unable to fit into current boots secondary to new AFOs    Time  12    Period  Weeks    Status  Deferred      OT LONG TERM GOAL #8   Title  Patient to complete am self care demonstrating efficient methods of dressing skills to get to school on time.     Baseline  Goal update:  patient reports it takes about an hour to get dressed, running late one time this week.  Still working on efficiency and speed of tasks    Time  12    Period  Weeks    Status  Partially Met      OT LONG TERM GOAL  #9   Baseline  Patient will don/doff socks and AFO braces with modified independence using adaptive equipment as needed.  Socks and braces are more difficult after showering and if socks are new, otherwise she can do it.     Time  12    Period  Weeks    Status  Partially Met      OT LONG TERM GOAL  #10   TITLE  Patient will demonstrate the ability to pull jeans down over AFOs with modified independence.     Baseline  continues to require min assist with this  task 11/23/17    Time  12    Period  Weeks    Status  On-going      OT LONG TERM GOAL  #11   TITLE  Will be able to squeeze tooth paste out of the  tube when it is less than half full.    Baseline  difficulty with small travel sized, may look into alternative ways since this has been a chronic issue.    Time  6    Period  Months    Status  Achieved      OT LONG TERM GOAL  #12   TITLE  Patient will demonstrate managing wheelchair rain poncho in inclement weather with modified independence.    Baseline  crutch tips are difficult for anyone to take them off, goal deferred.    Time  6    Period  Months    Status  Achieved      OT LONG TERM GOAL  #13   TITLE  Patient will be able to complete 2 1/2 minutes straight with left shoulder flexion simulating driving with a stearing wheel ball to be able to drive to college.     Baseline  Patient has not been driving, her family takes her to school and drops her off.     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     Baseline  Continued difficulty coordinating the use of both hands to complete task, speed still an issue 11/23/17    Status  Partially Met      OT LONG TERM GOAL  #15   TITLE  Patient will complete meal preparation with minimal assist.     Baseline  moderate assist 11/23/17    Time  12    Period  Weeks    Status  On-going            Plan - 03/01/18 0959    Clinical Impression Statement Pt. reports she plans to have her bathroom totally renovated, and a van modified for greater accessibility and independence. Pt. reports that both need to be completed by her 22st birthday in September.  Pt. continues to work on improving UE strength, and coordination skills. for improved use during ADLs, and IADLs.     Occupational Profile and client history currently impacting functional performance  decreased hand strength, balance and coordination.    Occupational performance deficits (Please refer to  evaluation for details):  ADL's;IADL's;Leisure;Education    Rehab Potential  Good    OT Frequency  1x / week    OT Duration  12 weeks    OT Treatment/Interventions  Self-care/ADL training;DME and/or AE instruction;Therapeutic activities;Therapeutic exercise;Neuromuscular education;Patient/family education    Plan  Occupational Therapy 1 x per week for ADL and strenghtening.    OT Home Exercise Plan  try elastic shoe laces and new tech for bra donning and doffing    Consulted and Agree with Plan of Care  Patient       Patient will benefit from skilled therapeutic intervention in order to improve the following deficits and impairments:  Impaired UE functional use, Difficulty walking, Decreased strength, Decreased coordination, Decreased endurance, Decreased balance  Visit Diagnosis: Muscle weakness (generalized)  Other lack  of coordination    Problem List There are no active problems to display for this patient.   Harrel Carina, MS, OTR/L 03/01/2018, 10:15 AM  [place] MAIN [facility] SERVICES [place], [place], [address] Phone: [phone]   Fax:  [phone]  Name: BLAKELYN DINGES MRN: [id number] Date of Birth: [birth year]

## 2018-03-07 NOTE — Addendum Note (Signed)
Addended by: Derrek GuLOVETT, Perla Echavarria T on: 03/07/2018 11:41 AM   Modules accepted: Orders

## 2018-03-08 ENCOUNTER — Ambulatory Visit: Payer: Medicaid Other

## 2018-03-08 ENCOUNTER — Ambulatory Visit: Payer: Medicaid Other | Admitting: Occupational Therapy

## 2018-03-08 DIAGNOSIS — R279 Unspecified lack of coordination: Secondary | ICD-10-CM

## 2018-03-08 DIAGNOSIS — M6281 Muscle weakness (generalized): Secondary | ICD-10-CM | POA: Diagnosis not present

## 2018-03-08 DIAGNOSIS — R262 Difficulty in walking, not elsewhere classified: Secondary | ICD-10-CM

## 2018-03-08 DIAGNOSIS — R278 Other lack of coordination: Secondary | ICD-10-CM

## 2018-03-08 NOTE — Therapy (Signed)
[facility] MAIN [facility] SERVICES [address] [place], [place], [address] Phone: [phone]   Fax:  [phone]  Patient Details  Name: CHASITIE PASSEY MRN: [id number] Date of Birth: [birth year] Referring Provider:  Carren Rang, PA-C  Encounter Date: 02/22/2018   LETTER OF MEDICAL NECESSITY   Mishayla Delmont is a 22 yo female who is currently enrolled at [employer] and lives with her parents.   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  She has been seen for Occupational Therapy in our clinic for the last several years with focus on management of her impairments and development of her activities of daily living/instrumental activities of daily living skills for home, community and school related tasks.  She presents with bilateral muscle weakness in both her upper extremities and lower extremities, decreased range of motion, decreased coordination (gross motor/fine motor), decreased mobility skills and decreased ability to perform necessary daily tasks.   Toyna has continued and will continue to receive Occupational Therapy and Physical Therapy to address her above stated physical limitations.  The following are a list of implications of her limitations in her daily tasks, which will need consideration with her transition to adulthood and independent living to be successful: 1)  Francenia continues to demonstrate limited balance especially for more dynamic movements/transitional movements. Without modifications and use of adaptive devices, she is at risk for falls and currently requires increased assistance with getting in and out of the shower. She also requires sitting to dry off after a shower. 2) [place] demonstrates muscle weakness in her bilateral lower extremities and upper extremities which limits her ability to move or lift items.  With her current bathroom setup, her mom has to move and put a shower  chair into the shower since the built in shower bench is now too small for Roshanna, this also decreases the amount of space in the current shower for movement.  3) With her balance deficits, Louanna must use either a wheelchair or her loft strand crutches to access the bathroom, her current bathroom door is narrow and does not allow for accommodating a larger wheelchair.  4) [place] demonstrates decreased coordination skills both fine motor and gross motor and would require self-care supplies to be accessible for her in the bathroom, placed at a height she can access from a seated position when dressing and performing grooming tasks.   Review of Angelicia's current bathroom setup:  Esthela's bathroom was structured for her when she was younger and smaller therefore, as she has now grown into an adult, she is having more difficulty with utilizing the space.  Her current toilet is lower than the norm and she would benefit from a toilet fit to her adult size as well as grab bars placed by the toilet to assist with transfers and balance during clothing negotiation.  Her shower has a step to access it and has a shower bench, which is too narrow for her adult body.  The shower also does not have a non-slip surface on the floor or the bench therefore she has had several episodes of falls/near falls with transfers. She cannot sit on the bench due to sliding off when wet.  She currently has to have assistance to transfer into the shower, out of the shower and managing a shower chair.  Mom has to move the shower chair in and out of the shower each time as well as position it for sitting to  dry off and work on dressing skills.  The following are recommendations from an Occupational Therapy standpoint and it is anticipated that the patient will be fully independent with her basic self-care tasks if the modifications are made. 1)  Replace the shower to provide a zero entry/low threshold entrance to accommodate for decreased  balance during shower transfers. 2) Recommend a built in shower bench custom fit to patient's adult size. Recommend this surface to be nonskid/non slip so that she can safely sit to bathe.  3) Grab bars placed into shower for balance and safety during shower transfers and transitional movements.  4) Flexible hand held shower to help with self-controlling the shower spray and pressure.  5) Area outside of the shower to sit for dressing skills due to decrease balance.  Patient has difficulty with involving both hands into a task while maintaining her balance and is safer to sit to complete these activities.  6) Storage area/shelves accessible in sitting to store self-care items, towels, linens and personal items required for daily tasks.  7) Replace toilet to accommodate for patient's adult size. 8) Grab bars placed by the toilet to assist with transfers and clothing negotiation after toileting.  9) Widen the doorway to accommodate for an adult size wheelchair, walker and loft strand crutches.  10) Replace vanity to accommodate adult size but also provide opportunity for tasks in sitting or standing.  Again, it is anticipated that patient will be fully independent with her daily self-care tasks if these modifications are completed.  Patient's personal goal is to be independent and not rely on family to assist with daily needs. While she is appreciative of her family's support, she feels she is now an adult, she needs and wants to be able to attend to her daily needs with some privacy and with integrity.     Thank you for your time and attention to this matter to make an impact on Delona's level of independence as she is transitioning to an adult.  If you have any further questions or concerns, please contact me at [phone].     Kerrie Buffalomy T Remington Highbaugh, OTR/L, CLT  Eann Cleland 03/08/2018, 11:45 AM  [place] [facility] [address] [place], [place],  [id number] Phone: [phone]   Fax:  [phone]

## 2018-03-08 NOTE — Therapy (Signed)
[place] MAIN [facility] SERVICES [address] [place], [place], [address] Phone: [phone]   Fax:  [phone]  Physical Therapy Treatment  Patient Details  Name: Michelle Randall MRN: [id number] Date of Birth: [birth year] Referring Provider: Langley Gauss MD   Encounter Date: 03/08/2018  PT End of Session - 03/08/18 1020    Visit Number  126    Number of Visits  134    Date for PT Re-Evaluation  03/28/18    Authorization Type  6/12    Authorization Time Period  12 visits 3/6-5/28    PT Start Time  1017    PT Stop Time  1100    PT Time Calculation (min)  43 min    Equipment Utilized During Treatment  Gait belt    Activity Tolerance  Patient tolerated treatment well;Patient limited by fatigue    Behavior During Therapy  [facility] for tasks assessed/performed       History reviewed. No pertinent past medical history.  History reviewed. No pertinent surgical history.  There were no vitals filed for this visit.  Subjective Assessment - 03/08/18 1019    Subjective  Patient reports no complaints or concerns. Had class prior to session today. Compliant with HEP.     Patient is accompained by:  Family member    Limitations  Walking    Patient Stated Goals  Patient wants to improve her core strength.     Currently in Pain?  No/denies       5lb bar overhead hold with SLR 10x each leg supine position; cues for keeping legs straight to avoid internal rotation   5lb bar overhead raise hold with bilateral knee bend and straighten (reverse crunches)    2lb dumbbell same side arm and leg meet 7x, opp arm and leg meet 7x     Bridges with assistance keeping feet on mat x 15   Posterior pelvic tilts 15x      Seated hip abduction with block under feet for proper alignment. 15x each LE, one LE at a time.    Seated marches with block under feet, arms crossed, cues for upright posture 15x   Seated resisted hamstring curls GTB 10x each leg     prone hip flexor/ quad stretch bent knee 60 seconds    Prone hamstring curls with mod A to R min A to L 10x      Pt. response to medical necessity:   Patient will continue to benefit from skilled physical therapy in order to maximize functional mobility and safety.                          PT Education - 03/08/18 1020    Education provided  Yes    Education Details  exercise technique,     Person(s) Educated  Patient    Methods  Explanation;Demonstration;Verbal cues    Comprehension  Verbalized understanding;Returned demonstration          PT Long Term Goals - 01/11/18 1407      PT LONG TERM GOAL #1   Title  Patient will improve Dynamic Gait Index (DGI) score to > 21/24 for meaningful improvement and low falls risk regarding dynamic walking tasks (revised from > 19/24 )    Baseline  14/24; 11/02/16: 15/24 01/28/17: 18/24 6/5: 19/24; 08/23/17 14/24,  14/24 10/11/17 14/24 12/20/17, 12/27/17/  14/24    Time  12    Period  [place]  Status  On-going      PT LONG TERM GOAL #2   Title  Patient will increase Berg Balance score by >51/56 points to be considered a low risk for falls for improved safety. (revised from >6 points improvement)    Baseline  11/02/16: 34/56 01/28/17: 39/56 6/5: 45/56; 08/23/2017 = 45/56, 43/56 10/11/17, 43/56 12/20/17, 12/27/17  43/56    Time  12    Period  Weeks    Status  On-going      PT LONG TERM GOAL #3   Title  Patient will be able to transfer in and out of a large car with  a high seat with CGA  to improve ability to go to school/doctor visits.     Baseline  Patient requires min A for transfer into large SUV unless it has a step rail on the side    Time  12    Period  Weeks    Status  On-going      PT LONG TERM GOAL #4   Title  Patient will complete a TUG test in < 12 seconds for independent mobility and decreased fall risk     Baseline  11.45; 08/23/2017 = 13.66 sec, 13.40 12/20/17, 12/27/17 12.07 sec    Time  12    Period  Weeks     Status  On-going      PT LONG TERM GOAL #5   Title  Patient will improve 6 minute walk distance by > 150 ft for improved return to functional community activities     Baseline  740 01/28/2017: 76f 6/5: 795  730 on 07/19/17; 790 ft on 08/23/18,  740 feet, 730 feet 12/20/17, 12/27/17 650 feet     Time  12    Period  Weeks    Status  On-going      PT LONG TERM GOAL #6   Title  Patient will improve gait speed to > 1.2 m/s with least restrictive assistive device to return to normal walking speed     Baseline  .76 m/h 01/28/2017: .87  07/19/17 0.648m; 0.77 m/s on 08/23/17, . 58 m/sec 10/11/17 .6624mc    Time  12    Period  Weeks    Status  On-going 03/21/18 target dat      PT LONG TERM GOAL #7   Title  Patient (< 60 23ars old) will complete five times sit to stand test in < 10 seconds indicating an increased LE strength and improved balance     Baseline  9.45 sec; 9.63 on 08/23/17, 9.63 sec 12/27/17, 10.07 sec    Time  12    Period  Weeks    Status  Achieved 03/26/18 target date      PT LONG TERM GOAL #8   Title  Patient will be able to ambulate on inclines and grass independenlty with LRAD    Baseline  Can ambulate over grassy inclines with decreased speed and safety requiring need loftstrand curtches and SBA    Time  12    Period  Weeks    Status  On-going 03/21/18 target date      PT LONG TERM GOAL  #9   TITLE  Patient will be abe to transfer from low chair or stool without UE support independently    Baseline  Patient needs min A assist to transfer from a low stool    Time  12    Period  Weeks    Status  Partially Met 03/21/18 target  date      PT LONG TERM GOAL  #10   TITLE  Patient will be able to transfer from the floor to standing independently with use of LRAD    Baseline  Patient able to stand from the floor independently if she has not been on the floor longer than about 5 min due to stiffness with prolonged positioning    Time  12    Period  Weeks    Status  On-going 03/21/18 target  date      PT LONG TERM GOAL  #11   TITLE  Patient will step up onto a 6" step 5x with AD independently to increase community mobility    Baseline  Pt. has increased difficulty requiring supervision for safety, but no physical assist    Time  12    Period  Weeks    Status  On-going 01/03/18 target date            Plan - 03/08/18 1032    Clinical Impression Statement  Patient demonstrating improved core stability and muscle recruitment with decreased verbal cueing for intervention performance and exercise technique. Occasional challenge with disassociation of LE noted with patient requiring excessive concentration and cueing. Patient will continue to benefit from skilled physical therapy in order to maximize functional mobility and safety.     Rehab Potential  Good    Clinical Impairments Affecting Rehab Potential  weakness and decreased standing balance    PT Frequency  1x / week    PT Duration  12 weeks    PT Treatment/Interventions  Therapeutic exercise;Therapeutic activities;Gait training;Balance training;Stair training;DME Instruction;Neuromuscular re-education;Patient/family education    PT Next Visit Plan  Continue POC    PT Home Exercise Plan  added seated dynamic core UE and LE lift offs with visual cues using mirror, supine marches       Patient will benefit from skilled therapeutic intervention in order to improve the following deficits and impairments:  Abnormal gait, Decreased balance, Decreased endurance, Difficulty walking, Decreased strength  Visit Diagnosis: Muscle weakness (generalized)  Other lack of coordination  Lack of coordination  Difficulty in walking, not elsewhere classified     Problem List There are no active problems to display for this patient.  Janna Arch, PT, DPT   03/08/2018, 11:09 AM  [place] MAIN [facility] SERVICES [address] [place], [place], [address] Phone: [phone]   Fax:   [phone]  Name: Michelle Randall MRN: [id number] Date of Birth: [birth year]

## 2018-03-15 ENCOUNTER — Ambulatory Visit: Payer: Medicaid Other | Admitting: Occupational Therapy

## 2018-03-15 ENCOUNTER — Encounter: Payer: Self-pay | Admitting: Occupational Therapy

## 2018-03-15 ENCOUNTER — Ambulatory Visit: Payer: Medicaid Other | Attending: Student

## 2018-03-15 DIAGNOSIS — R278 Other lack of coordination: Secondary | ICD-10-CM | POA: Diagnosis present

## 2018-03-15 DIAGNOSIS — M6281 Muscle weakness (generalized): Secondary | ICD-10-CM

## 2018-03-15 DIAGNOSIS — R279 Unspecified lack of coordination: Secondary | ICD-10-CM | POA: Insufficient documentation

## 2018-03-15 DIAGNOSIS — R262 Difficulty in walking, not elsewhere classified: Secondary | ICD-10-CM | POA: Diagnosis present

## 2018-03-15 NOTE — Therapy (Signed)
[place] MAIN [facility] SERVICES [address] [place], [place], [address] Phone: [phone]   Fax:  [phone]  Physical Therapy Treatment  Patient Details  Name: Michelle Randall MRN: [id number] Date of Birth: 1996-04-06 Referring Provider: Langley Gauss MD   Encounter Date: 03/15/2018  PT End of Session - 03/15/18 0935    Visit Number  127    Number of Visits  134    Date for PT Re-Evaluation  03/28/18    Authorization Type  7/12    Authorization Time Period  12 visits 3/6-5/28    PT Start Time  0930    PT Stop Time  1015    PT Time Calculation (min)  45 min    Equipment Utilized During Treatment  Gait belt    Activity Tolerance  Patient tolerated treatment well;Patient limited by fatigue    Behavior During Therapy  [facility] for tasks assessed/performed       History reviewed. No pertinent past medical history.  History reviewed. No pertinent surgical history.  There were no vitals filed for this visit.  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 Feeling fatigued today due to increase exercise yesterday.     Patient is accompained by:  Family member    Limitations  Walking    Patient Stated Goals  Patient wants to improve her core strength.     Currently in Pain?  No/denies      NuStep Lvl 2 4 minutes with cues for staying >60 bpm for cardiovascular support  Step over and back green band on floor to promote increased step length and control of foot placement. 12x each LE   Side step over green band on floor in // bars, BUE support 12x each LE  Airex pad : balance:2 minutes occasional finger tip support to prevent LOB  Standing weighted ball raises (2000GB) 10x   Quadruped with UE raises to clap hands with PT 2 minutes; cues for abdominal contraction to reduce sway and LOB  Child pose (modified) 1 minute  Prone :  hamstring/hip flexor stretch 2 minutes per side.   Bridge 10x with PT support at feet/.    LE rotation low back stretch 2 minutes  Supine hooklying marches 10x    OTB hooklying abduction 10x   Posterior pelvic tilts 10x 3 second holds    Pt. response to medical necessity: . Patient will continue to benefit from skilled physical therapy in order to maximize functional mobility and safety.              PT Education - 03/15/18 0935    Education provided  Yes    Education Details  exercise technique     Person(s) Educated  Patient    Methods  Explanation;Demonstration;Verbal cues    Comprehension  Verbalized understanding;Returned demonstration          PT Long Term Goals - 01/11/18 1407      PT LONG TERM GOAL #1   Title  Patient will improve Dynamic Gait Index (DGI) score to > 21/24 for meaningful improvement and low falls risk regarding dynamic walking tasks (revised from > 19/24 )    Baseline  14/24; 11/02/16: 15/24 01/28/17: 18/24 6/5: 19/24; 08/23/17 14/24,  14/24 10/11/17 14/24 12/20/17, 12/27/17/  14/24    Time  12    Period  Weeks    Status  On-going  PT LONG TERM GOAL #2   Title  Patient will increase Berg Balance score by >51/56 points to be considered a low risk for falls for improved safety. (revised from >6 points improvement)    Baseline  11/02/16: 34/56 01/28/17: 39/56 6/5: 45/56; 08/23/2017 = 45/56, 43/56 10/11/17, 43/56 12/20/17, 12/27/17  43/56    Time  12    Period  Weeks    Status  On-going      PT LONG TERM GOAL #3   Title  Patient will be able to transfer in and out of a large car with  a high seat with CGA  to improve ability to go to school/doctor visits.     Baseline  Patient requires min A for transfer into large SUV unless it has a step rail on the side    Time  12    Period  Weeks    Status  On-going      PT LONG TERM GOAL #4   Title  Patient will complete a TUG test in < 12 seconds for independent mobility and decreased fall risk      Baseline  11.45; 08/23/2017 = 13.66 sec, 13.40 12/20/17, 12/27/17 12.07 sec    Time  12    Period  Weeks    Status  On-going      PT LONG TERM GOAL #5   Title  Patient will improve 6 minute walk distance by > 150 ft for improved return to functional community activities     Baseline  740 01/28/2017: 756f 6/5: 795  730 on 07/19/17; 790 ft on 08/23/18,  740 feet, 730 feet 12/20/17, 12/27/17 650 feet     Time  12    Period  Weeks    Status  On-going      PT LONG TERM GOAL #6   Title  Patient will improve gait speed to > 1.2 m/s with least restrictive assistive device to return to normal walking speed     Baseline  .76 m/h 01/28/2017: .87  07/19/17 0.647m; 0.77 m/s on 08/23/17, . 58 m/sec 10/11/17 .6678mc    Time  12    Period  Weeks    Status  On-going 03/21/18 target dat      PT LONG TERM GOAL #7   Title  Patient (< 60 62ars old) will complete five times sit to stand test in < 10 seconds indicating an increased LE strength and improved balance     Baseline  9.45 sec; 9.63 on 08/23/17, 9.63 sec 12/27/17, 10.07 sec    Time  12    Period  Weeks    Status  Achieved 03/26/18 target date      PT LONG TERM GOAL #8   Title  Patient will be able to ambulate on inclines and grass independenlty with LRAD    Baseline  Can ambulate over grassy inclines with decreased speed and safety requiring need loftstrand curtches and SBA    Time  12    Period  Weeks    Status  On-going 03/21/18 target date      PT LONG TERM GOAL  #9   TITLE  Patient will be abe to transfer from low chair or stool without UE support independently    Baseline  Patient needs min A assist to transfer from a low stool    Time  12    Period  Weeks    Status  Partially Met 03/21/18 target date      PT LONG  TERM GOAL  #10   TITLE  Patient will be able to transfer from the floor to standing independently with use of LRAD    Baseline  Patient able to stand from the floor independently if she has not been on the floor longer than about 5 min due  to stiffness with prolonged positioning    Time  12    Period  Weeks    Status  On-going 03/21/18 target date      PT LONG TERM GOAL  #11   TITLE  Patient will step up onto a 6" step 5x with AD independently to increase community mobility    Baseline  Pt. has increased difficulty requiring supervision for safety, but no physical assist    Time  12    Period  Weeks    Status  On-going 01/03/18 target date            Plan - 03/15/18 1014    Clinical Impression Statement  Patient fatigued quickly throughout session due to overdoing it the day prior. Reaching while in quadruped challenged patient stability initially until patient demonstrated ability to contract core musculature for stabilization. Patient will continue to benefit from skilled physical therapy in order to maximize functional mobility and safety.     Rehab Potential  Good    Clinical Impairments Affecting Rehab Potential  weakness and decreased standing balance    PT Frequency  1x / week    PT Duration  12 weeks    PT Treatment/Interventions  Therapeutic exercise;Therapeutic activities;Gait training;Balance training;Stair training;DME Instruction;Neuromuscular re-education;Patient/family education    PT Next Visit Plan  Continue POC    PT Home Exercise Plan  added seated dynamic core UE and LE lift offs with visual cues using mirror, supine marches       Patient will benefit from skilled therapeutic intervention in order to improve the following deficits and impairments:  Abnormal gait, Decreased balance, Decreased endurance, Difficulty walking, Decreased strength  Visit Diagnosis: Muscle weakness (generalized)  Other lack of coordination  Lack of coordination  Difficulty in walking, not elsewhere classified     Problem List There are no active problems to display for this patient.  Janna Arch, PT, DPT   03/15/2018, 10:18 AM  [place] MAIN [facility] SERVICES [address] [place], [place], [address] Phone: [phone]   Fax:  [phone]  Name: Michelle Randall MRN: [id number] Date of Birth: 1995/11/22

## 2018-03-15 NOTE — Therapy (Signed)
[name] MAIN [facility] SERVICES [address] [place], [place], [address] Phone: [phone]   Fax:  [phone]  Occupational Therapy Treatment  Patient Details  Name: MINDI AKERSON MRN: [id number] Date of Birth: Jun 06, 1996 Referring Provider: Langley Gauss MD   Encounter Date: 03/15/2018  OT End of Session - 03/15/18 1036    Visit Number  117    Number of Visits  130    Date for OT Re-Evaluation  02/17/18    Authorization Type  medicaid visit 89 of 118       History reviewed. No pertinent past medical history.  History reviewed. No pertinent surgical history.  There were no vitals filed for this visit.  Subjective Assessment - 03/15/18 1028    Subjective   Pt. reports she was up late last night talking to a friend that attends [employer].    Patient is accompained by:  Family member    Patient Stated Goals  To be as independent as possible at home and at school.     Currently in Pain?  No/denies      OT TREATMENT    Neuro muscular re-education:  Pt. Worked on grasping 1/2" objects in her right hand, and translatory movements moving them from her palm to the the tip of her 2nd through 5th digits to place them in a pegboard. Pt. Dropped a few pegs.  Pt. worked on grasping one inch resistive cubes alternating thumb opposition to the tip of the 2nd through 5th digits. The board was positioned at a vertical angle. Pt. worked on pressing them back into place while isolating 2nd through 5th digits. Pt. worked on grasping, flipping and stacking 2" large pegs on the Instructo board placed at a tabletop surface. Pt. Worked on flipping cards alternating thumb on fingers, and fingers on thumb movements.  Therapeutic Exercise:  Pt. performed gross gripping with grip strengthener. Pt. worked on sustaining grip while grasping pegs and reaching at various heights. Gripper was placed in the 3rd resistive slot with the white resistive spring  for the right, and left hands. Pt. worked on grasping one inch resistive cubes alternating thumb opposition to the tip of the 2nd through 5th digits. The board was positioned at a vertical angle. Pt. worked on pressing them back into place while isolating 2nd through 5th digits. Pt. worked on grasping, flipping and stacking 2" large pegs on the Instructo board placed at a tabletop surface.                        OT Education - 03/15/18 1038    Education provided  Yes    Education Details  strength, and coordination    Person(s) Educated  Patient    Methods  Explanation;Demonstration;Verbal cues    Comprehension  Verbalized understanding;Returned demonstration          OT Long Term Goals - 02/25/18 1827      OT LONG TERM GOAL #1   Title  Patient will demonstrate increased strength in right hand sufficient to scoop ice cream from frozen container with modified independence.    Baseline  goal update:  patient still able to complete with minimal assist at times.    Time  12    Period  Months    Status  On-going      OT LONG TERM GOAL #2   Title  Patient will be able to make her bed with modified independence.  Baseline  she continues to have difficulty with balance and completing this task, increased time, but no assistance needed. Would like to work towards efficiency in time    Time  6    Period  Months    Status  On-going      OT LONG TERM GOAL #3   Title  Patient will complete back closure bra donning with modified independence.     Baseline  max assist at eval    Time  12    Period  Weeks    Status  New      OT LONG TERM GOAL #4   Title  Patient will increase BUE strength by 1 mm grade to be able to self propel her wheelchair over carpet for distances greater than 300 feet    Baseline  short distance mobility over carpet about 100 feet before fatigue.     Time  12    Period  Weeks    Status  New      OT LONG TERM GOAL #5   Title  Patient will  demonstrate the ability to perform small buttons on shirts independently and with good speed in order to dress for school.    Baseline  goal update: Patient can perform with increased time but occasionally needs assist if rushed, still difficult at goal update 11/23/2017    Time  12    Period  Weeks    Status  Partially Met      OT LONG TERM GOAL #6   Title  Patient will donn and doff small earrings with modified independence.    Baseline  Goal update:  still able to hold small backs and dropping only 20% of the time, slow to complete but improving     Time  12    Period  Weeks    Status  Partially Met      OT LONG TERM GOAL #7   Title  Patient will be able to zip her rainboots while they are on her feet with modified independence.      Baseline  patient unable to fit into current boots secondary to new AFOs    Time  12    Period  Weeks    Status  Deferred      OT LONG TERM GOAL #8   Title  Patient to complete am self care demonstrating efficient methods of dressing skills to get to school on time.     Baseline  Goal update:  patient reports it takes about an hour to get dressed, running late one time this week.  Still working on efficiency and speed of tasks    Time  12    Period  Weeks    Status  Partially Met      OT LONG TERM GOAL  #9   Baseline  Patient will don/doff socks and AFO braces with modified independence using adaptive equipment as needed.  Socks and braces are more difficult after showering and if socks are new, otherwise she can do it.     Time  12    Period  Weeks    Status  Partially Met      OT LONG TERM GOAL  #10   TITLE  Patient will demonstrate the ability to pull jeans down over AFOs with modified independence.     Baseline  continues to require min assist with this task 11/23/17    Time  12    Period  Weeks  Status  On-going      OT LONG TERM GOAL  #11   TITLE  Will be able to squeeze tooth paste out of the tube when it is less than half full.     Baseline  difficulty with small travel sized, may look into alternative ways since this has been a chronic issue.    Time  6    Period  Months    Status  Achieved      OT LONG TERM GOAL  #12   TITLE  Patient will demonstrate managing wheelchair rain poncho in inclement weather with modified independence.    Baseline  crutch tips are difficult for anyone to take them off, goal deferred.    Time  6    Period  Months    Status  Achieved      OT LONG TERM GOAL  #13   TITLE  Patient will be able to complete 2 1/2 minutes straight with left shoulder flexion simulating driving with a stearing wheel ball to be able to drive to college.     Baseline  Patient has not been driving, her family takes her to school and drops her off.     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     Baseline  Continued difficulty coordinating the use of both hands to complete task, speed still an issue 11/23/17    Status  Partially Met      OT LONG TERM GOAL  #15   TITLE  Patient will complete meal preparation with minimal assist.     Baseline  moderate assist 11/23/17    Time  12    Period  Weeks    Status  On-going            Plan - 03/15/18 1039    Clinical Impression Statement  Pt. walked alot at school yesterday because her w/c battery died. Pt. reports she attended senior research projects at [employer]. Pt. reports being fatigued today.  Pt. reports her family is in the process of looking for a [employer]ianne [employer]i, and they are getting estimates bathroom renovations. Pt. continues to present with limited UE strength, and coordination skills. Pt. continues to work on improving UE strength, and coordination skills for improved ADL, and IADL functioning.    Occupational Profile and client history currently impacting functional performance   decreased hand strength, balance and coordination.    Occupational performance deficits (Please refer to evaluation for details):  ADL's;IADL's;Leisure;Education    Rehab Potential  Good    OT Frequency  1x / week    OT Duration  12 weeks    OT Treatment/Interventions  Self-care/ADL training;DME and/or AE instruction;Therapeutic activities;Therapeutic exercise;Neuromuscular education;Patient/family education    Plan  Occupational Therapy 1 x per week for ADL and strenghtening.    OT Home Exercise Plan  try elastic shoe laces and new tech for bra donning and doffing    Consulted and Agree with Plan of Care  Patient    Family Member Consulted  mom       Patient will benefit from skilled therapeutic intervention in order to improve the following deficits and impairments:  Impaired UE functional use, Difficulty walking, Decreased strength, Decreased  coordination, Decreased endurance, Decreased balance  Visit Diagnosis: Muscle weakness (generalized)  Other lack of coordination    Problem List There are no active problems to display for this patient.   Harrel Carina, MS, OTR/L 03/15/2018, 10:51 AM  [place] MAIN [facility] SERVICES [address] [place], [place], [address] Phone: [phone]   Fax:  [phone]  Name: CIMONE FAHEY MRN: [id number] Date of Birth: 08-10-1996

## 2018-03-22 ENCOUNTER — Ambulatory Visit: Payer: Medicaid Other | Admitting: Physical Therapy

## 2018-03-22 ENCOUNTER — Encounter: Payer: Self-pay | Admitting: Physical Therapy

## 2018-03-22 ENCOUNTER — Ambulatory Visit: Payer: Medicaid Other | Admitting: Occupational Therapy

## 2018-03-22 ENCOUNTER — Encounter: Payer: Self-pay | Admitting: Occupational Therapy

## 2018-03-22 VITALS — HR 117

## 2018-03-22 DIAGNOSIS — R278 Other lack of coordination: Secondary | ICD-10-CM

## 2018-03-22 DIAGNOSIS — R279 Unspecified lack of coordination: Secondary | ICD-10-CM

## 2018-03-22 DIAGNOSIS — M6281 Muscle weakness (generalized): Secondary | ICD-10-CM

## 2018-03-22 DIAGNOSIS — R262 Difficulty in walking, not elsewhere classified: Secondary | ICD-10-CM

## 2018-03-22 NOTE — Therapy (Signed)
[place] MAIN [facility] SERVICES [address] [place], [place], [address] Phone: [phone]   Fax:  [phone]  Physical Therapy Treatment  Patient Details  Name: Michelle Randall MRN: [id number] Date of Birth: [birth year] Referring Provider: Langley Gauss MD   Encounter Date: 03/22/2018  PT End of Session - 03/22/18 1022    Visit Number  128    Number of Visits  134    Date for PT Re-Evaluation  03/28/18    Authorization Type  8/12    Authorization Time Period  12 visits 3/6-5/28    PT Start Time  1022    PT Stop Time  1055    PT Time Calculation (min)  33 min    Equipment Utilized During Treatment  Gait belt    Activity Tolerance  Patient tolerated treatment well;Patient limited by fatigue    Behavior During Therapy  [facility] for tasks assessed/performed       History reviewed. No pertinent past medical history.  History reviewed. No pertinent surgical history.  There were no vitals filed for this visit.  Subjective Assessment - 03/22/18 1024    Subjective  Pt is feeling well but pt has not eaten or drank since 4am this morning as she is practicing Ramadan.  Pt has charged her WC since last session so this has not been an issue.      Patient is accompained by:  Family member    Limitations  Walking    Patient Stated Goals  Patient wants to improve her core strength.     Currently in Pain?  No/denies       TREATMENT  Generalized BLE stretching in supine: hip IR/ER, hamstrings, Bil knee to chest, single knee to chest, hamstring, hip adductors 2x2 minutes each    Lumbar trunk rotations x20 each direction in hooklying  Seated prayer stretch with theraball x10 with 10 second holds  Pulse remains 100-123.                        PT Education - 03/22/18 1022    Education provided  Yes    Education Details  Reasoning behind stretching interventions.  Pt verbalized understanding of importance of notifying her  doctor if she does not feel well.      Person(s) Educated  Patient    Methods  Explanation;Demonstration;Verbal cues    Comprehension  Verbalized understanding;Returned demonstration;Verbal cues required;Need further instruction          PT Long Term Goals - 01/11/18 1407      PT LONG TERM GOAL #1   Title  Patient will improve Dynamic Gait Index (DGI) score to > 21/24 for meaningful improvement and low falls risk regarding dynamic walking tasks (revised from > 19/24 )    Baseline  14/24; 11/02/16: 15/24 01/28/17: 18/24 6/5: 19/24; 08/23/17 14/24,  14/24 10/11/17 14/24 12/20/17, 12/27/17/  14/24    Time  12    Period  Weeks    Status  On-going      PT LONG TERM GOAL #2   Title  Patient will increase Berg Balance score by >51/56 points to be considered a low risk for falls for improved safety. (revised from >6 points improvement)    Baseline  11/02/16: 34/56 01/28/17: 39/56 6/5: 45/56; 08/23/2017 = 45/56, 43/56 10/11/17, 43/56 12/20/17, 12/27/17  43/56    Time  12    Period  Weeks    Status  On-going  PT LONG TERM GOAL #3   Title  Patient will be able to transfer in and out of a large car with  a high seat with CGA  to improve ability to go to school/doctor visits.     Baseline  Patient requires min A for transfer into large SUV unless it has a step rail on the side    Time  12    Period  Weeks    Status  On-going      PT LONG TERM GOAL #4   Title  Patient will complete a TUG test in < 12 seconds for independent mobility and decreased fall risk     Baseline  11.45; 08/23/2017 = 13.66 sec, 13.40 12/20/17, 12/27/17 12.07 sec    Time  12    Period  Weeks    Status  On-going      PT LONG TERM GOAL #5   Title  Patient will improve 6 minute walk distance by > 150 ft for improved return to functional community activities     Baseline  740 01/28/2017: 744f 6/5: 795  730 on 07/19/17; 790 ft on 08/23/18,  740 feet, 730 feet 12/20/17, 12/27/17 650 feet     Time  12    Period  Weeks    Status   On-going      PT LONG TERM GOAL #6   Title  Patient will improve gait speed to > 1.2 m/s with least restrictive assistive device to return to normal walking speed     Baseline  .76 m/h 01/28/2017: .87  07/19/17 0.66m; 0.77 m/s on 08/23/17, . 58 m/sec 10/11/17 .6651mc    Time  12    Period  Weeks    Status  On-going 03/21/18 target dat      PT LONG TERM GOAL #7   Title  Patient (< 60 14ars old) will complete five times sit to stand test in < 10 seconds indicating an increased LE strength and improved balance     Baseline  9.45 sec; 9.63 on 08/23/17, 9.63 sec 12/27/17, 10.07 sec    Time  12    Period  Weeks    Status  Achieved 03/26/18 target date      [place]   Title  Patient will be able to ambulate on inclines and grass independenlty with LRAD    Baseline  Can ambulate over grassy inclines with decreased speed and safety requiring need loftstrand curtches and SBA    Time  12    Period  Weeks    Status  On-going 03/21/18 target date      PT LONG TERM GOAL  #9   TITLE  Patient will be abe to transfer from low chair or stool without UE support independently    Baseline  Patient needs min A assist to transfer from a low stool    Time  12    Period  Weeks    Status  Partially Met 03/21/18 target date      PT LONG TERM GOAL  #10   TITLE  Patient will be able to transfer from the floor to standing independently with use of LRAD    Baseline  Patient able to stand from the floor independently if she has not been on the floor longer than about 5 min due to stiffness with prolonged positioning    Time  12    Period  Weeks    Status  On-going 03/21/18 target date  PT LONG TERM GOAL  #11   TITLE  Patient will step up onto a 6" step 5x with AD independently to increase community mobility    Baseline  Pt. has increased difficulty requiring supervision for safety, but no physical assist    Time  12    Period  Weeks    Status  On-going 01/03/18 target date            Plan -  03/22/18 1033    Clinical Impression Statement  Pt presented with elevated resting pulse as pt is fasting from Ramadan.   Therefore, had pt complete generalized stretching as pt reports stiffness and tightness as she is spending most of her time studying as she is in finals week at school.  Pt's pulse remained 100-123 thorughout session and tolerated session well.  She denies any dizzines, lightheadedness, chest pain, and reports feeling well.  Instructed pt to call MD or call 911 should she begin to feel unwell.  Pt will benefit from continued skilled PT for improved functional mobility.     Rehab Potential  Good    Clinical Impairments Affecting Rehab Potential  weakness and decreased standing balance    PT Frequency  1x / week    PT Duration  12 weeks    PT Treatment/Interventions  Therapeutic exercise;Therapeutic activities;Gait training;Balance training;Stair training;DME Instruction;Neuromuscular re-education;Patient/family education    PT Next Visit Plan  Continue POC    PT Home Exercise Plan  added seated dynamic core UE and LE lift offs with visual cues using mirror, supine marches       Patient will benefit from skilled therapeutic intervention in order to improve the following deficits and impairments:  Abnormal gait, Decreased balance, Decreased endurance, Difficulty walking, Decreased strength  Visit Diagnosis: Muscle weakness (generalized)  Other lack of coordination  Lack of coordination  Difficulty in walking, not elsewhere classified     Problem List There are no active problems to display for this patient.   Collie Siad PT, DPT 03/22/2018, 10:56 AM  [place] MAIN [facility] SERVICES [address] [place], [place], [address] Phone: [phone]   Fax:  [phone]  Name: Michelle Randall MRN: [id number] Date of Birth: 1996/11/10

## 2018-03-23 NOTE — Therapy (Signed)
[place] MAIN [facility] SERVICES [address] [place], [place], [address] Phone: [phone]   Fax:  [phone]  Occupational Therapy Treatment  Patient Details  Name: Michelle Randall MRN: [id number] Date of Birth: [birth year] Referring Provider: Langley Gauss MD   Encounter Date: 03/22/2018  OT End of Session - 03/23/18 2259    Visit Number  118    Number of Visits  130    Date for OT Re-Evaluation  05/19/18    OT Start Time  1102    OT Stop Time  1147    OT Time Calculation (min)  45 min    Activity Tolerance  Patient tolerated treatment well    Behavior During Therapy  [facility] for tasks assessed/performed       History reviewed. No pertinent past medical history.  History reviewed. No pertinent surgical history.  Vitals:   03/22/18 1107  Pulse: (!) 117    Subjective Assessment - 03/23/18 2257    Subjective   Patient reports she did not sleep much last night, went to bed at midnight and then was up at 4 am to feast for Ramadan.  Her pulse rate was increased to 120 with PT prior to tx and with rest 100 .    Patient Stated Goals  To be as independent as possible at home and at school.     Currently in Pain?  No/denies    Pain Score  0-No pain                   OT Treatments/Exercises (OP) - 03/23/18 2257      Neurological Re-education Exercises   Other Exercises 1  Patient seen for manipulation of 100 pegboard/Judi board pieces to flip from one end to the other and place into grid, cues for prehension patterns at times and speed of completion.  Patient seen for bilateral hand finger strengthening with use of push pins to place on moderate resistive bulletin board.  Cues to make sure pins are pushed all the way into the board.    Other Exercises 2  Patient seen for resistive putty green for scooping exercise like simulated ice cream scoop.  Verbal cues and therapist demo for a variety of techniques, scooping forwards,  pushing back.                    OT Long Term Goals - 02/25/18 1827      OT LONG TERM GOAL #1   Title  Patient will demonstrate increased strength in right hand sufficient to scoop ice cream from frozen container with modified independence.    Baseline  goal update:  patient still able to complete with minimal assist at times.    Time  12    Period  Months    Status  On-going      OT LONG TERM GOAL #2   Title  Patient will be able to make her bed with modified independence.    Baseline  she continues to have difficulty with balance and completing this task, increased time, but no assistance needed. Would like to work towards efficiency in time    Time  6    Period  Months    Status  On-going      OT LONG TERM GOAL #3   Title  Patient will complete back closure bra donning with modified independence.     Baseline  max assist at eval    Time  12    Period  Weeks    Status  New      OT LONG TERM GOAL #4   Title  Patient will increase BUE strength by 1 mm grade to be able to self propel her wheelchair over carpet for distances greater than 300 feet    Baseline  short distance mobility over carpet about 100 feet before fatigue.     Time  12    Period  Weeks    Status  New      OT LONG TERM GOAL #5   Title  Patient will demonstrate the ability to perform small buttons on shirts independently and with good speed in order to dress for school.    Baseline  goal update: Patient can perform with increased time but occasionally needs assist if rushed, still difficult at goal update 11/23/2017    Time  12    Period  Weeks    Status  Partially Met      OT LONG TERM GOAL #6   Title  Patient will donn and doff small earrings with modified independence.    Baseline  Goal update:  still able to hold small backs and dropping only 20% of the time, slow to complete but improving     Time  12    Period  Weeks    Status  Partially Met      OT LONG TERM GOAL #7   Title  Patient will  be able to zip her rainboots while they are on her feet with modified independence.      Baseline  patient unable to fit into current boots secondary to new AFOs    Time  12    Period  Weeks    Status  Deferred      OT LONG TERM GOAL #8   Title  Patient to complete am self care demonstrating efficient methods of dressing skills to get to school on time.     Baseline  Goal update:  patient reports it takes about an hour to get dressed, running late one time this week.  Still working on efficiency and speed of tasks    Time  12    Period  Weeks    Status  Partially Met      OT LONG TERM GOAL  #9   Baseline  Patient will don/doff socks and AFO braces with modified independence using adaptive equipment as needed.  Socks and braces are more difficult after showering and if socks are new, otherwise she can do it.     Time  12    Period  Weeks    Status  Partially Met      OT LONG TERM GOAL  #10   TITLE  Patient will demonstrate the ability to pull jeans down over AFOs with modified independence.     Baseline  continues to require min assist with this task 11/23/17    Time  12    Period  Weeks    Status  On-going      OT LONG TERM GOAL  #11   TITLE  Will be able to squeeze tooth paste out of the tube when it is less than half full.    Baseline  difficulty with small travel sized, may look into alternative ways since this has been a chronic issue.    Time  6    Period  Months    Status  Achieved      OT LONG  TERM GOAL  #12   TITLE  Patient will demonstrate managing wheelchair rain poncho in inclement weather with modified independence.    Baseline  crutch tips are difficult for anyone to take them off, goal deferred.    Time  6    Period  Months    Status  Achieved      OT LONG TERM GOAL  #13   TITLE  Patient will be able to complete 2 1/2 minutes straight with left shoulder flexion simulating driving with a stearing wheel ball to be able to drive to college.     Baseline  Patient has  not been driving, her family takes her to school and drops her off.     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     Baseline  Continued difficulty coordinating the use of both hands to complete task, speed still an issue 11/23/17    Status  Partially Met      OT LONG TERM GOAL  #15   TITLE  Patient will complete meal preparation with minimal assist.     Baseline  moderate assist 11/23/17    Time  12    Period  Weeks    Status  On-going            Plan - 03/23/18 2259    Clinical Impression Statement  Patient continues to progress in all areas and continues to work towards increasing her independence in daily tasks.  She is planning on a trip abroad with her university but will require to be fully independent with her self care tasks.  We have continued to work towards speed of tasks, coordination and fine motor skills to complete tasks such as donning jewerly and managing earrings. She is currently fasting during Ramadan and her pulse rate was high this date therefore did not perform intensive exercises.     Occupational Profile and client history currently impacting functional performance  decreased hand strength, balance and coordination.    Occupational performance deficits (Please refer to evaluation for details):  ADL's;IADL's;Leisure;Education    Rehab Potential  Good    OT Frequency  1x / week    OT Duration  12 weeks    OT Treatment/Interventions  Self-care/ADL training;DME and/or AE instruction;Therapeutic activities;Therapeutic exercise;Neuromuscular education;Patient/family education    Plan  Occupational Therapy 1 x per week for ADL and strenghtening.    Consulted and Agree with Plan of Care  Patient       Patient will benefit from skilled therapeutic intervention in order to improve the  following deficits and impairments:  Impaired UE functional use, Difficulty walking, Decreased strength, Decreased coordination, Decreased endurance, Decreased balance  Visit Diagnosis: Muscle weakness (generalized)  Other lack of coordination    Problem List There are no active problems to display for this patient.  Achilles Dunk, OTR/L, CLT  Crew Goren 03/23/2018, 11:03 PM  [place] MAIN [facility] SERVICES [address] [place], [place], [address] Phone: [phone]   Fax:  [phone]  Name: Michelle Randall MRN: [id number] Date of Birth: 05-11-1996

## 2018-03-29 ENCOUNTER — Ambulatory Visit: Payer: Medicaid Other | Admitting: Occupational Therapy

## 2018-03-29 ENCOUNTER — Encounter: Payer: Self-pay | Admitting: Occupational Therapy

## 2018-03-29 ENCOUNTER — Ambulatory Visit: Payer: Medicaid Other | Admitting: Physical Therapy

## 2018-03-29 VITALS — BP 102/62 | HR 125

## 2018-03-29 DIAGNOSIS — R278 Other lack of coordination: Secondary | ICD-10-CM

## 2018-03-29 DIAGNOSIS — M6281 Muscle weakness (generalized): Secondary | ICD-10-CM | POA: Diagnosis not present

## 2018-03-29 DIAGNOSIS — R279 Unspecified lack of coordination: Secondary | ICD-10-CM

## 2018-03-29 DIAGNOSIS — R262 Difficulty in walking, not elsewhere classified: Secondary | ICD-10-CM

## 2018-03-29 NOTE — Therapy (Signed)
[place] MAIN [facility] SERVICES [address] [place], [place], [address] Phone: [phone]   Fax:  [phone]  Occupational Therapy Treatment  Patient Details  Name: Michelle Randall MRN: [id number] Date of Birth: 06-15-1996 Referring Provider: Langley Gauss MD   Encounter Date: 03/29/2018  OT End of Session - 03/29/18 1033    Visit Number  119    Number of Visits  130    Date for OT Re-Evaluation  05/19/18    Authorization Type  medicaid visit 92 of 39    OT Start Time  1015    OT Stop Time  1100    OT Time Calculation (min)  45 min    Equipment Utilized During Treatment  lemon press    Activity Tolerance  Patient tolerated treatment well    Behavior During Therapy  [facility] for tasks assessed/performed       History reviewed. No pertinent past medical history.  History reviewed. No pertinent surgical history.  There were no vitals filed for this visit.  Subjective Assessment - 03/29/18 1020    Subjective   Pt. reports that she is getting.     Patient is accompained by:  Family member    Patient Stated Goals  To be as independent as possible at home and at school.     Currently in Pain?  No/denies      OT TREATMENT    Neuro muscular re-education:  Pt. worked on grasping 2" sticks and placing them into a soft pegboard placed at a vertical angle to in wrist extension. Pt. worked on removing them with resistive tweezers. Pt. worked grasping 1/8" pegs while manipulating long nosed tweezers to change the position of the small pegs.                          OT Education - 03/29/18 1027    Education provided  Yes    Education Details  [facility]    Person(s) Educated  Patient    Methods  Explanation;Demonstration;Verbal cues    Comprehension  Verbalized understanding;Returned demonstration;Verbal cues required;Need further instruction          OT Long Term Goals - 02/25/18 1827      OT LONG TERM GOAL #1   Title  Patient will demonstrate increased strength in right hand sufficient to scoop ice cream from frozen container with modified independence.    Baseline  goal update:  patient still able to complete with minimal assist at times.    Time  12    Period  Months    Status  On-going      OT LONG TERM GOAL #2   Title  Patient will be able to make her bed with modified independence.    Baseline  she continues to have difficulty with balance and completing this task, increased time, but no assistance needed. Would like to work towards efficiency in time    Time  6    Period  Months    Status  On-going      OT LONG TERM GOAL #3   Title  Patient will complete back closure bra donning with modified independence.     Baseline  max assist at eval    Time  12    Period  Weeks    Status  New      OT LONG TERM GOAL #4   Title  Patient will increase BUE strength by 1  mm grade to be able to self propel her wheelchair over carpet for distances greater than 300 feet    Baseline  short distance mobility over carpet about 100 feet before fatigue.     Time  12    Period  Weeks    Status  New      OT LONG TERM GOAL #5   Title  Patient will demonstrate the ability to perform small buttons on shirts independently and with good speed in order to dress for school.    Baseline  goal update: Patient can perform with increased time but occasionally needs assist if rushed, still difficult at goal update 11/23/2017    Time  12    Period  Weeks    Status  Partially Met      OT LONG TERM GOAL #6   Title  Patient will donn and doff small earrings with modified independence.    Baseline  Goal update:  still able to hold small backs and dropping only 20% of the time, slow to complete but improving     Time  12    Period  Weeks    Status  Partially Met      OT LONG TERM GOAL #7   Title  Patient will be able to zip her rainboots while they are on her feet with modified independence.      Baseline  patient  unable to fit into current boots secondary to new AFOs    Time  12    Period  Weeks    Status  Deferred      OT LONG TERM GOAL #8   Title  Patient to complete am self care demonstrating efficient methods of dressing skills to get to school on time.     Baseline  Goal update:  patient reports it takes about an hour to get dressed, running late one time this week.  Still working on efficiency and speed of tasks    Time  12    Period  Weeks    Status  Partially Met      OT LONG TERM GOAL  #9   Baseline  Patient will don/doff socks and AFO braces with modified independence using adaptive equipment as needed.  Socks and braces are more difficult after showering and if socks are new, otherwise she can do it.     Time  12    Period  Weeks    Status  Partially Met      OT LONG TERM GOAL  #10   TITLE  Patient will demonstrate the ability to pull jeans down over AFOs with modified independence.     Baseline  continues to require min assist with this task 11/23/17    Time  12    Period  Weeks    Status  On-going      OT LONG TERM GOAL  #11   TITLE  Will be able to squeeze tooth paste out of the tube when it is less than half full.    Baseline  difficulty with small travel sized, may look into alternative ways since this has been a chronic issue.    Time  6    Period  Months    Status  Achieved      OT LONG TERM GOAL  #12   TITLE  Patient will demonstrate managing wheelchair rain poncho in inclement weather with modified independence.    Baseline  crutch tips are difficult for anyone to take  them off, goal deferred.    Time  6    Period  Months    Status  Achieved      OT LONG TERM GOAL  #13   TITLE  Patient will be able to complete 2 1/2 minutes straight with left shoulder flexion simulating driving with a stearing wheel ball to be able to drive to college.     Baseline  Patient has not been driving, her family takes her to school and drops her off.     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     Baseline  Continued difficulty coordinating the use of both hands to complete task, speed still an issue 11/23/17    Status  Partially Met      OT LONG TERM GOAL  #15   TITLE  Patient will complete meal preparation with minimal assist.     Baseline  moderate assist 11/23/17    Time  12    Period  Weeks    Status  On-going            Plan - 03/29/18 1035    Clinical Impression Statement  Pt.'s HR 117 bpms. Pt. attributes HR to fasting. Pt. reports they are in the process of shopping for a vehicle. Pt. reports they are planning to make a purchase within the next week or two.  [employer] will do the renovations to the vehicle. Pt. reports they have gotten the estimates for the bathroom renoovations, and is now waiting for the paperwork to go through to begin the renovations. Pt. continues to work on improving [facility] skills in preparation for buttoning, and ADL tasks.   Occupational Profile and client history currently impacting functional performance  decreased hand strength, balance and coordination.    Occupational performance deficits (Please refer to evaluation for details):  ADL's;IADL's;Leisure;Education    Rehab Potential  Good    OT Frequency  1x / week    OT Duration  12 weeks    OT Treatment/Interventions  Self-care/ADL training;DME and/or AE instruction;Therapeutic activities;Therapeutic exercise;Neuromuscular education;Patient/family education    Plan  Occupational Therapy 1 x per week for ADL and strenghtening.    OT Home Exercise Plan  try elastic shoe laces and new tech for bra donning and doffing    Consulted and Agree with Plan of Care  Patient       Patient will benefit from skilled therapeutic intervention in order to improve the following deficits and impairments:  Impaired UE  functional use, Difficulty walking, Decreased strength, Decreased coordination, Decreased endurance, Decreased balance  Visit Diagnosis: Other lack of coordination    Problem List There are no active problems to display for this patient.   Harrel Carina, MS, OTR/L 03/29/2018, 10:52 AM  [place] MAIN [facility] SERVICES [address] [place], [place], [address] Phone: [phone]   Fax:  [phone]  Name: ANYA MURPHEY MRN: [id number] Date of Birth: 1996-04-10

## 2018-03-29 NOTE — Therapy (Signed)
[place] MAIN [facility] SERVICES [address] [place], [place], [address] Phone: [phone]   Fax:  [phone]  Physical Therapy Treatment  Patient Details  Name: Michelle Randall MRN: [id number] Date of Birth: 12-31-1995 Referring Provider: Langley Gauss MD   Encounter Date: 03/29/2018  PT End of Session - 03/29/18 0933    Visit Number  129    Number of Visits  146    Date for PT Re-Evaluation  06/21/18    Authorization Type  9/12    Authorization Time Period  12 visits 3/6-5/28    PT Start Time  0932    PT Stop Time  1013    PT Time Calculation (min)  41 min    Equipment Utilized During Treatment  Gait belt    Activity Tolerance  Patient tolerated treatment well;Patient limited by fatigue    Behavior During Therapy  [facility] for tasks assessed/performed       History reviewed. No pertinent past medical history.  History reviewed. No pertinent surgical history.  Vitals:   03/29/18 0938  BP: 102/62  Pulse: (!) 125  SpO2: 100%    Subjective Assessment - 03/29/18 0937    Subjective  Pt continues to fast for Ramadan.  She is feeling well, no issues since last session.  Is preparing for final examinations.      Patient is accompained by:  Family member    Limitations  Walking    Patient Stated Goals  Patient wants to improve her core strength.     Currently in Pain?  No/denies        TREATMENT   BP 102/62, SpO2 100%, pulse 107-125   Generalized BLE stretching in supine: hip IR/ER, hamstrings, Bil knee to chest, single knee to chest, hamstring, hip adductors 2x2 minutes each ?   Lumbar trunk rotations x20 each direction in hooklying   Bridges 2x10, pulse remains 109-118. Assist provided to hold LEs in proper position.   Hooklying Bil hip Abd/ER isometrics with light manual resistance. Discontinued after 12 reps due to pulse up to 124.   Seated prayer stretch with theraball x10 with 10 second holds   TUG: 13.42 seconds                          PT Education - 03/29/18 0932    Education provided  Yes    Education Details  Exercise technique    Person(s) Educated  Patient    Methods  Explanation;Demonstration;Verbal cues    Comprehension  Verbalized understanding;Returned demonstration;Verbal cues required;Need further instruction          PT Long Term Goals - 03/29/18 1012      PT LONG TERM GOAL #1   Title  Patient will improve Dynamic Gait Index (DGI) score to > 21/24 for meaningful improvement and low falls risk regarding dynamic walking tasks (revised from > 19/24 )    Baseline  14/24; 11/02/16: 15/24 01/28/17: 18/24 6/5: 19/24; 08/23/17 14/24,  14/24 10/11/17 14/24 12/20/17, 12/27/17/  14/24    Time  12    Period  Weeks    Status  On-going      PT LONG TERM GOAL #2   Title  Patient will increase Berg Balance score by >51/56 points to be considered a low risk for falls for improved safety. (revised from >6 points improvement)    Baseline  11/02/16: 34/56 01/28/17: 39/56 6/5: 45/56; 08/23/2017 = 45/56, 43/56 10/11/17,  43/56 12/20/17, 12/27/17  43/56    Time  12    Period  Weeks    Status  On-going      PT LONG TERM GOAL #3   Title  Patient will be able to transfer in and out of a large car with  a high seat with CGA  to improve ability to go to school/doctor visits.     Baseline  Patient requires min A for transfer into large SUV unless it has a step rail on the side; 5/15: pt requires assist with a really high car but can get out of an SUV without assist, typically needs more assits getting in and getting in on the L side    Time  12    Period  Weeks    Status  On-going      PT LONG TERM GOAL #4   Title  Patient will complete a TUG test in < 12 seconds for independent mobility and decreased fall risk     Baseline  11.45; 08/23/2017 = 13.66 sec, 13.40 12/20/17, 12/27/17 12.07 sec; 5/15: 13.42 seconds    Time  12    Period  Weeks    Status  On-going      PT LONG TERM GOAL #5    Title  Patient will improve 6 minute walk distance by > 150 ft for improved return to functional community activities     Baseline  740 01/28/2017: 730f 6/5: 795  730 on 07/19/17; 790 ft on 08/23/18,  740 feet, 730 feet 12/20/17, 12/27/17 650 feet     Time  12    Period  Weeks    Status  On-going      PT LONG TERM GOAL #6   Title  Patient will improve gait speed to > 1.2 m/s with least restrictive assistive device to return to normal walking speed     Baseline  .76 m/h 01/28/2017: .87  07/19/17 0.677m; 0.77 m/s on 08/23/17, . 58 m/sec 10/11/17 .6651mc    Time  12    Period  Weeks    Status  On-going 03/21/18 target dat      PT LONG TERM GOAL #7   Title  Patient (< 60 49ars old) will complete five times sit to stand test in < 10 seconds indicating an increased LE strength and improved balance     Baseline  9.45 sec; 9.63 on 08/23/17, 9.63 sec 12/27/17, 10.07 sec    Time  12    Period  Weeks    Status  Achieved 03/26/18 target date      [place]   Title  Patient will be able to ambulate on inclines and grass independenlty with LRAD    Baseline  Can ambulate over grassy inclines with decreased speed and safety requiring need loftstrand curtches and SBA; 5/15: pt reports she has to walk slowly and feels unsteady but that this is improving    Time  12    Period  Weeks    Status  On-going 03/21/18 target date      PT LONG TERM GOAL  #9   TITLE  Patient will be abe to transfer from low chair or stool without UE support independently    Baseline  Patient needs min A assist to transfer from a low stool    Time  12    Period  Weeks    Status  Partially Met 03/21/18 target date      PT LONG  TERM GOAL  #10   TITLE  Patient will be able to transfer from the floor to standing independently with use of LRAD    Baseline  Patient able to stand from the floor independently if she has not been on the floor longer than about 5 min due to stiffness with prolonged positioning    Time  12    Period  Weeks     Status  On-going 03/21/18 target date      PT LONG TERM GOAL  #11   TITLE  Patient will step up onto a 6" step 5x with AD independently to increase community mobility    Baseline  Pt. has increased difficulty requiring supervision for safety, but no physical assist    Time  12    Period  Weeks    Status  On-going 01/03/18 target date            Plan - 03/29/18 0939    Clinical Impression Statement  Pt continues to demonstrate elevated pulse at rest and increasing with activity.  Pt denies feeling unwell but was instructed to call MD or call 911 should she begin to feel unwell.  Elevated pulse likely a result of fasting for Ramadan. She also has only been getting 4 hrs of sleep for the past week for Ramadan.  Pt tolerated all stretches well and light therapuetic exercises.  Pt reports her stability is improving when ambulating on uneven surfaces in the grass but still feels unsteady at times.  Pt reports she is able to get out of an SUV without assist the mojority of the time but does require assist to get into an SUV/larger car.  Pt will benefit from continued skilled PT interventions for improved flexibility, strength, and independence.      Rehab Potential  Good    Clinical Impairments Affecting Rehab Potential  weakness and decreased standing balance    PT Frequency  1x / week    PT Duration  12 weeks    PT Treatment/Interventions  Therapeutic exercise;Therapeutic activities;Gait training;Balance training;Stair training;DME Instruction;Neuromuscular re-education;Patient/family education    PT Next Visit Plan  Continue POC    PT Home Exercise Plan  added seated dynamic core UE and LE lift offs with visual cues using mirror, supine marches       Patient will benefit from skilled therapeutic intervention in order to improve the following deficits and impairments:  Abnormal gait, Decreased balance, Decreased endurance, Difficulty walking, Decreased strength  Visit Diagnosis: Muscle  weakness (generalized)  Other lack of coordination  Lack of coordination  Difficulty in walking, not elsewhere classified     Problem List There are no active problems to display for this patient.   Collie Siad PT, DPT 03/29/2018, 12:03 PM  [place] MAIN [facility] SERVICES [address] [place], [place], [address] Phone: [phone]   Fax:  [phone]  Name: Michelle Randall MRN: [id number] Date of Birth: 1996-03-15

## 2018-04-05 ENCOUNTER — Ambulatory Visit: Payer: Medicaid Other | Admitting: Physical Therapy

## 2018-04-05 ENCOUNTER — Ambulatory Visit: Payer: Medicaid Other

## 2018-04-07 ENCOUNTER — Ambulatory Visit: Payer: Medicaid Other | Admitting: Occupational Therapy

## 2018-04-12 ENCOUNTER — Ambulatory Visit: Payer: Medicaid Other

## 2018-04-12 ENCOUNTER — Ambulatory Visit: Payer: Medicaid Other | Admitting: Physical Therapy

## 2018-04-12 DIAGNOSIS — M6281 Muscle weakness (generalized): Secondary | ICD-10-CM | POA: Diagnosis not present

## 2018-04-12 DIAGNOSIS — R262 Difficulty in walking, not elsewhere classified: Secondary | ICD-10-CM

## 2018-04-12 NOTE — Therapy (Signed)
[place] MAIN [facility] SERVICES [address] [place], [place], [address] Phone: [phone]   Fax:  [phone]  Physical Therapy Treatment  Patient Details  Name: Michelle Randall MRN: [id number] Date of Birth: May 27, 1996 Referring Provider: Langley Gauss MD   Encounter Date: 04/12/2018  PT End of Session - 04/12/18 1027    Visit Number  130    Number of Visits  146    Date for PT Re-Evaluation  06/21/18    Authorization Type  1/12    Authorization Time Period  12 visits 5/29-8/20    PT Start Time  0930    PT Stop Time  1014    PT Time Calculation (min)  44 min    Equipment Utilized During Treatment  Gait belt    Activity Tolerance  Patient tolerated treatment well    Behavior During Therapy  [facility] for tasks assessed/performed       History reviewed. No pertinent past medical history.  History reviewed. No pertinent surgical history.  There were no vitals filed for this visit.  Subjective Assessment - 04/12/18 1025    Subjective  Pt continues to fast for Ramadan.  She prefers to work on supine and sitting exercises today due to low energy. No specific questions or concerns currently.     Patient is accompained by:  Family member    Limitations  Walking    Patient Stated Goals  Patient wants to improve her core strength.     Currently in Pain?  No/denies        TREATMENT   Ther-ex  BLE HS stretches 30s x 2 bilateral; Isometric resisted lumbar trunk rotations 2 x 10 each direction in hooklying  Bridges 2 x 10, Assist provided to hold LEs in proper position. Hooklying Bil hip Abd/ER isometrics with light manual resistance 2 x 10. Hooklying pball press down 5s hold x 10; AROM hip abduction x 10; Manually resisted hooklying marches 2 x 10 bilateral;                       PT Education - 04/12/18 1027    Education provided  Yes    Education Details  exercise form/technique    Person(s) Educated  Patient     Methods  Explanation    Comprehension  Verbalized understanding          PT Long Term Goals - 03/29/18 1012      PT LONG TERM GOAL #1   Title  Patient will improve Dynamic Gait Index (DGI) score to > 21/24 for meaningful improvement and low falls risk regarding dynamic walking tasks (revised from > 19/24 )    Baseline  14/24; 11/02/16: 15/24 01/28/17: 18/24 6/5: 19/24; 08/23/17 14/24,  14/24 10/11/17 14/24 12/20/17, 12/27/17/  14/24    Time  12    Period  Weeks    Status  On-going      PT LONG TERM GOAL #2   Title  Patient will increase Berg Balance score by >51/56 points to be considered a low risk for falls for improved safety. (revised from >6 points improvement)    Baseline  11/02/16: 34/56 01/28/17: 39/56 6/5: 45/56; 08/23/2017 = 45/56, 43/56 10/11/17, 43/56 12/20/17, 12/27/17  43/56    Time  12    Period  Weeks    Status  On-going      PT LONG TERM GOAL #3   Title  Patient will be able to transfer  in and out of a large car with  a high seat with CGA  to improve ability to go to school/doctor visits.     Baseline  Patient requires min A for transfer into large SUV unless it has a step rail on the side; 5/15: pt requires assist with a really high car but can get out of an SUV without assist, typically needs more assits getting in and getting in on the L side    Time  12    Period  Weeks    Status  On-going      PT LONG TERM GOAL #4   Title  Patient will complete a TUG test in < 12 seconds for independent mobility and decreased fall risk     Baseline  11.45; 08/23/2017 = 13.66 sec, 13.40 12/20/17, 12/27/17 12.07 sec; 5/15: 13.42 seconds    Time  12    Period  Weeks    Status  On-going      PT LONG TERM GOAL #5   Title  Patient will improve 6 minute walk distance by > 150 ft for improved return to functional community activities     Baseline  740 01/28/2017: 793f 6/5: 795  730 on 07/19/17; 790 ft on 08/23/18,  740 feet, 730 feet 12/20/17, 12/27/17 650 feet     Time  12    Period  Weeks     Status  On-going      PT LONG TERM GOAL #6   Title  Patient will improve gait speed to > 1.2 m/s with least restrictive assistive device to return to normal walking speed     Baseline  .76 m/h 01/28/2017: .87  07/19/17 0.64m; 0.77 m/s on 08/23/17, . 58 m/sec 10/11/17 .6647mc    Time  12    Period  Weeks    Status  On-going 03/21/18 target dat      PT LONG TERM GOAL #7   Title  Patient (< 60 27ars old) will complete five times sit to stand test in < 10 seconds indicating an increased LE strength and improved balance     Baseline  9.45 sec; 9.63 on 08/23/17, 9.63 sec 12/27/17, 10.07 sec    Time  12    Period  Weeks    Status  Achieved 03/26/18 target date      [place]   Title  Patient will be able to ambulate on inclines and grass independenlty with LRAD    Baseline  Can ambulate over grassy inclines with decreased speed and safety requiring need loftstrand curtches and SBA; 5/15: pt reports she has to walk slowly and feels unsteady but that this is improving    Time  12    Period  Weeks    Status  On-going 03/21/18 target date      PT LONG TERM GOAL  #9   TITLE  Patient will be abe to transfer from low chair or stool without UE support independently    Baseline  Patient needs min A assist to transfer from a low stool    Time  12    Period  Weeks    Status  Partially Met 03/21/18 target date      PT LONG TERM GOAL  #10   TITLE  Patient will be able to transfer from the floor to standing independently with use of LRAD    Baseline  Patient able to stand from the floor independently if she has not been on  the floor longer than about 5 min due to stiffness with prolonged positioning    Time  12    Period  Weeks    Status  On-going 03/21/18 target date      PT LONG TERM GOAL  #11   TITLE  Patient will step up onto a 6" step 5x with AD independently to increase community mobility    Baseline  Pt. has increased difficulty requiring supervision for safety, but no physical assist     Time  12    Period  Weeks    Status  On-going 01/03/18 target date            Plan - 04/12/18 1029    Clinical Impression Statement  Pt is still fasting for Ramadan and only gets approximately 4 hours of sleep/night so she is somewhat fatigued today. She requested supine and seated exercises today. She is able to complete all exercises as instructed with very weak hip abduction and external rotation noted. Pt encouraged to continue HEP and follow-up as scheduled. Pt will benefit from continued skilled PT interventions for improved flexibility, strength, and independence.    Rehab Potential  Good    Clinical Impairments Affecting Rehab Potential  weakness and decreased standing balance    PT Frequency  1x / week    PT Duration  12 weeks    PT Treatment/Interventions  Therapeutic exercise;Therapeutic activities;Gait training;Balance training;Stair training;DME Instruction;Neuromuscular re-education;Patient/family education    PT Next Visit Plan  Continue POC    PT Home Exercise Plan  added seated dynamic core UE and LE lift offs with visual cues using mirror, supine marches       Patient will benefit from skilled therapeutic intervention in order to improve the following deficits and impairments:  Abnormal gait, Decreased balance, Decreased endurance, Difficulty walking, Decreased strength  Visit Diagnosis: Muscle weakness (generalized)  Difficulty in walking, not elsewhere classified     Problem List There are no active problems to display for this patient.  Phillips Grout PT, DPT   Huprich,Jason 04/12/2018, 5:22 PM  [place] MAIN [facility] SERVICES [address] [place], [place], [address] Phone: [phone]   Fax:  [phone]  Name: Michelle Randall MRN: [id number] Date of Birth: Feb 02, 1996

## 2018-04-13 ENCOUNTER — Encounter: Payer: Medicaid Other | Admitting: Occupational Therapy

## 2018-04-18 ENCOUNTER — Ambulatory Visit: Payer: Medicaid Other

## 2018-04-18 ENCOUNTER — Encounter: Payer: Medicaid Other | Admitting: Occupational Therapy

## 2018-04-20 ENCOUNTER — Ambulatory Visit: Payer: Medicaid Other | Attending: Student

## 2018-04-20 DIAGNOSIS — R278 Other lack of coordination: Secondary | ICD-10-CM | POA: Insufficient documentation

## 2018-04-20 DIAGNOSIS — R262 Difficulty in walking, not elsewhere classified: Secondary | ICD-10-CM | POA: Insufficient documentation

## 2018-04-20 DIAGNOSIS — M6281 Muscle weakness (generalized): Secondary | ICD-10-CM | POA: Insufficient documentation

## 2018-04-21 ENCOUNTER — Ambulatory Visit: Payer: Medicaid Other | Admitting: Occupational Therapy

## 2018-04-26 ENCOUNTER — Ambulatory Visit: Payer: Medicaid Other | Admitting: Occupational Therapy

## 2018-04-26 ENCOUNTER — Ambulatory Visit: Payer: Medicaid Other

## 2018-05-04 ENCOUNTER — Ambulatory Visit: Payer: Medicaid Other | Admitting: Occupational Therapy

## 2018-05-04 ENCOUNTER — Encounter: Payer: Self-pay | Admitting: Occupational Therapy

## 2018-05-04 ENCOUNTER — Ambulatory Visit: Payer: Medicaid Other

## 2018-05-04 DIAGNOSIS — R278 Other lack of coordination: Secondary | ICD-10-CM

## 2018-05-04 DIAGNOSIS — R262 Difficulty in walking, not elsewhere classified: Secondary | ICD-10-CM

## 2018-05-04 DIAGNOSIS — M6281 Muscle weakness (generalized): Secondary | ICD-10-CM

## 2018-05-04 NOTE — Therapy (Signed)
[place] MAIN [facility] SERVICES [address] [place], [place], [address] Phone: [phone]   Fax:  [phone]  Physical Therapy Treatment  Patient Details  Name: Michelle Randall MRN: [id number] Date of Birth: [birth year] Referring Provider: Langley Gauss MD   Encounter Date: 05/04/2018  PT End of Session - 05/04/18 1104    Visit Number  131    Number of Visits  146    Date for PT Re-Evaluation  06/21/18    Authorization Type  2/12    Authorization Time Period  12 visits 5/29-8/20    PT Start Time  6286    PT Stop Time  1100    PT Time Calculation (min)  45 min    Equipment Utilized During Treatment  Gait belt    Activity Tolerance  Patient tolerated treatment well    Behavior During Therapy  [facility] for tasks assessed/performed       History reviewed. No pertinent past medical history.  History reviewed. No pertinent surgical history.  There were no vitals filed for this visit.  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 Ramadan fast is now over and patient has been doing her exercises at home.     Patient is accompained by:  Family member    Limitations  Walking    Patient Stated Goals  Patient wants to improve her core strength.     Currently in Pain?  No/denies       Goals: DGI BERG: 38 / 56  TUG: 12 seconds 10 MWT: 13 seconds= .77 m/s 6 min walk test: 655  Ambulate on inclines and grass independently Transfer from low chair without UE support Transfer from floor to standing   Patient will step up onto a 6" step 5x with AD independently to increase community mobility  Prone hip flexor stretch 3 minutes each leg (PROM)    [facility] PT Assessment - 05/04/18 0001      Berg Balance Test   Sit to Stand  Able to stand without using hands and stabilize independently    Standing  Unsupported  Able to stand safely 2 minutes    Sitting with Back Unsupported but Feet Supported on Floor or Stool  Able to sit safely and securely 2 minutes    Stand to Sit  Sits safely with minimal use of hands    Transfers  Able to transfer safely, minor use of hands    Standing Unsupported with Eyes Closed  Able to stand 10 seconds safely    Standing Ubsupported with Feet Together  Needs help to attain position but able to stand for 30 seconds with feet together    From Standing, Reach Forward with Outstretched Arm  Can reach forward >12 cm safely (5")    From Standing Position, Pick up Object from Floor  Able to pick up shoe, needs supervision    From Standing Position, Turn to Look Behind Over each Shoulder  Looks behind from both sides and weight shifts well    Turn 360 Degrees  Needs close supervision or verbal cueing    Standing Unsupported, Alternately Place Feet on Step/Stool  Able to complete >2 steps/needs minimal assist    Standing Unsupported, One Foot in Front  Needs help to step but can hold 15 seconds    Standing on One Leg  Unable to try or needs assist to prevent fall    Total Score  38                         PT Education - 05/04/18 1102    Education provided  Yes    Education Details  Goal progression, exercise technique     Person(s) Educated  Patient    Methods  Explanation;Demonstration;Verbal cues    Comprehension  Verbalized understanding;Returned demonstration          PT Long Term Goals - 05/04/18 1026      PT LONG TERM GOAL #1   Title  Patient will improve Dynamic Gait Index (DGI) score to > 21/24 for meaningful improvement and low falls risk regarding dynamic walking tasks (revised from > 19/24 )    Baseline  14/24; 11/02/16: 15/24 01/28/17: 18/24 6/5: 19/24; 08/23/17 14/24,  14/24 10/11/17 14/24 12/20/17, 12/27/17/  14/24    Time  12    Period  Weeks    Status  On-going      PT LONG TERM GOAL #2   Title  Patient will increase Berg  Balance score by >51/56 points to be considered a low risk for falls for improved safety. (revised from >6 points improvement)    Baseline  11/02/16: 34/56 01/28/17: 39/56 6/5: 45/56; 08/23/2017 = 45/56, 43/56 10/11/17, 43/56 12/20/17, 12/27/17  43/56 6/20: 38/56 no UE support     Time  12    Period  Weeks    Status  On-going      PT LONG TERM GOAL #3   Title  Patient will be able to transfer in and out of a large car with  a high seat with CGA  to improve ability to go to school/doctor visits.     Baseline  Patient requires min A for transfer into large SUV unless it has a step rail on the side; 5/15: pt requires assist with a really high car but can get out of an SUV without assist, typically needs more assits getting in and getting in on the L side; 6/20: if ledge able to do with little to no assistance.     Time  12    Period  Weeks    Status  Partially Met      PT LONG TERM GOAL #4   Title  Patient will complete a TUG test in < 12 seconds for independent mobility and decreased fall risk     Baseline  11.45; 08/23/2017 = 13.66 sec, 13.40 12/20/17, 12/27/17 12.07 sec; 5/15: 13.42 seconds 6/20: 12 seconds    Time  12    Period  Weeks    Status  Achieved      PT LONG TERM GOAL #5   Title  Patient will improve 6 minute walk distance by > 150 ft for improved return to functional community activities     Baseline  740 01/28/2017: 79f 6/5: 795  730 on 07/19/17; 790 ft on 08/23/18,  740 feet, 730 feet 12/20/17, 12/27/17 650 feet 6/20: 665    Time  12    Period  Weeks    Status  On-going      PT LONG TERM GOAL #6   Title  Patient will improve gait speed to > 1.2 m/s with least restrictive assistive device to return to normal walking speed     Baseline  .76 m/h 01/28/2017: .87  07/19/17 0.627m; 0.77 m/s on 08/23/17, . 58  m/sec 10/11/17 .21msec 6/20: 77 m/s     Time  12    Period  Weeks    Status  On-going 03/21/18 target dat      PT LONG TERM GOAL #7   Title  Patient (< 623years old) will complete five  times sit to stand test in < 10 seconds indicating an increased LE strength and improved balance     Baseline  9.45 sec; 9.63 on 08/23/17, 9.63 sec 12/27/17, 10.07 sec    Time  12    Period  Weeks    Status  Achieved 03/26/18 target date      PT [place]#8   Title  Patient will be able to ambulate on inclines and grass independenlty with LRAD    Baseline  Can ambulate over grassy inclines with decreased speed and safety requiring need loftstrand curtches and SBA; 5/15: pt reports she has to walk slowly and feels unsteady but that this is improving6/20 : reports cautious and slow walk, able to do mod I    Time  12    Period  Weeks    Status  On-going 03/21/18 target date      PT LONG TERM GOAL  #9   TITLE  Patient will be abe to transfer from low chair or stool without UE support independently    Baseline  Patient needs min A assist to transfer from a low stool    Time  12    Period  Weeks    Status  Partially Met 03/21/18 target date      PT LONG TERM GOAL  #10   TITLE  Patient will be able to transfer from the floor to standing independently with use of LRAD    Baseline  Patient able to stand from the floor independently if she has not been on the floor longer than about 5 min due to stiffness with prolonged positioning; 6/20: challenging due to feet falling alseep     Time  12    Period  Weeks    Status  On-going 03/21/18 target date      PT LONG TERM GOAL  #11   TITLE  Patient will step up onto a 6" step 5x with AD independently to increase community mobility    Baseline  6/20: challenging to patient ascending steps, supervision     Time  12    Period  Weeks    Status  On-going 01/03/18 target date            Plan - 05/04/18 1109    Clinical Impression Statement   Patient's goals were re-assessed today. Some progress noted however patient has been absent for most of this month due to internship and class but will return to therapy when returns from trip. Improved stability  without canes/ UE support. Pt will benefit from continued skilled PT interventions for improved flexibility, strength, and independence    Rehab Potential  Good    Clinical Impairments Affecting Rehab Potential  weakness and decreased standing balance    PT Frequency  1x / week    PT Duration  12 weeks    PT Treatment/Interventions  Therapeutic exercise;Therapeutic activities;Gait training;Balance training;Stair training;DME Instruction;Neuromuscular re-education;Patient/family education    PT Next Visit Plan  Continue POC    PT Home Exercise Plan  added seated dynamic core UE and LE lift offs with visual cues using mirror, supine marches       Patient will benefit from skilled  therapeutic intervention in order to improve the following deficits and impairments:  Abnormal gait, Decreased balance, Decreased endurance, Difficulty walking, Decreased strength  Visit Diagnosis: Muscle weakness (generalized)  Difficulty in walking, not elsewhere classified  Other lack of coordination     Problem List There are no active problems to display for this patient.  Janna Arch, PT, DPT   05/04/2018, 11:12 AM  [place] MAIN [facility] SERVICES [place], [place], [address] Phone: [phone]   Fax:  [phone]  Name: Michelle Randall MRN: [id number] Date of Birth: [birth year]

## 2018-05-04 NOTE — Therapy (Signed)
[place] MAIN [facility] SERVICES [address] [place], [place], [address] Phone: [phone]   Fax:  [phone]  Occupational Therapy Treatment  Patient Details  Name: Michelle Randall MRN: [id number] Date of Birth: Jan 01, 1996 Referring Provider: Langley Gauss MD   Encounter Date: 05/04/2018  OT End of Session - 05/04/18 1116    Visit Number  120    Number of Visits  130    Date for OT Re-Evaluation  05/19/18    Authorization Type  medicaid visit 52 of 118    OT Start Time  1100    OT Stop Time  1145    OT Time Calculation (min)  45 min    Equipment Utilized During Treatment  lemon press    Activity Tolerance  Patient tolerated treatment well    Behavior During Therapy  [facility] for tasks assessed/performed       History reviewed. No pertinent past medical history.  History reviewed. No pertinent surgical history.  There were no vitals filed for this visit.  Subjective Assessment - 05/04/18 1109    Subjective   Pt. reports that she is getting.     Patient is accompained by:  Family member    Patient Stated Goals  To be as independent as possible at home and at school.     Currently in Pain?  No/denies    Pain Score  0-No pain      OT TREATMENT    Neuro muscular re-education:  Pt. worked on grasping small 1/4" beads and using bilateral hand coordination skills to connect them with resistance. Pt. Worked on  Disconnecting the beads using a lateral grasp. Pt. performed [facility] skills training to improve speed and dexterity needed for ADL tasks and writing. Pt. demonstrated grasping 1 inch sticks,  inch cylindrical collars, and  inch flat washers on the [employer] pegboard. Pt. performed grasping each item with her 2nd digit and thumb, and storing them in the palm. Pt. worked on Nutritional therapist, and moving them through the palmar aspect of the hand one at a time.                        OT Education - 05/04/18 1114     Education provided  Yes    Education Details  [facility] skills    Person(s) Educated  Patient    Methods  Explanation    Comprehension  Verbalized understanding;Returned demonstration;Tactile cues required          OT Long Term Goals - 02/25/18 1827      OT LONG TERM GOAL #1   Title  Patient will demonstrate increased strength in right hand sufficient to scoop ice cream from frozen container with modified independence.    Baseline  goal update:  patient still able to complete with minimal assist at times.    Time  12    Period  Months    Status  On-going      OT LONG TERM GOAL #2   Title  Patient will be able to make her bed with modified independence.    Baseline  she continues to have difficulty with balance and completing this task, increased time, but no assistance needed. Would like to work towards efficiency in time    Time  6    Period  Months    Status  On-going      OT LONG TERM GOAL #3   Title  Patient will complete back closure bra donning with modified independence.     Baseline  max assist at eval    Time  12    Period  Weeks    Status  New      OT LONG TERM GOAL #4   Title  Patient will increase BUE strength by 1 mm grade to be able to self propel her wheelchair over carpet for distances greater than 300 feet    Baseline  short distance mobility over carpet about 100 feet before fatigue.     Time  12    Period  Weeks    Status  New      OT LONG TERM GOAL #5   Title  Patient will demonstrate the ability to perform small buttons on shirts independently and with good speed in order to dress for school.    Baseline  goal update: Patient can perform with increased time but occasionally needs assist if rushed, still difficult at goal update 11/23/2017    Time  12    Period  Weeks    Status  Partially Met      OT LONG TERM GOAL #6   Title  Patient will donn and doff small earrings with modified independence.    Baseline  Goal update:  still able to hold small  backs and dropping only 20% of the time, slow to complete but improving     Time  12    Period  Weeks    Status  Partially Met      OT LONG TERM GOAL #7   Title  Patient will be able to zip her rainboots while they are on her feet with modified independence.      Baseline  patient unable to fit into current boots secondary to new AFOs    Time  12    Period  Weeks    Status  Deferred      OT LONG TERM GOAL #8   Title  Patient to complete am self care demonstrating efficient methods of dressing skills to get to school on time.     Baseline  Goal update:  patient reports it takes about an hour to get dressed, running late one time this week.  Still working on efficiency and speed of tasks    Time  12    Period  Weeks    Status  Partially Met      OT LONG TERM GOAL  #9   Baseline  Patient will don/doff socks and AFO braces with modified independence using adaptive equipment as needed.  Socks and braces are more difficult after showering and if socks are new, otherwise she can do it.     Time  12    Period  Weeks    Status  Partially Met      OT LONG TERM GOAL  #10   TITLE  Patient will demonstrate the ability to pull jeans down over AFOs with modified independence.     Baseline  continues to require min assist with this task 11/23/17    Time  12    Period  Weeks    Status  On-going      OT LONG TERM GOAL  #11   TITLE  Will be able to squeeze tooth paste out of the tube when it is less than half full.    Baseline  difficulty with small travel sized, may look into alternative ways since this has been a chronic issue.  Time  6    Period  Months    Status  Achieved      OT LONG TERM GOAL  #12   TITLE  Patient will demonstrate managing wheelchair rain poncho in inclement weather with modified independence.    Baseline  crutch tips are difficult for anyone to take them off, goal deferred.    Time  6    Period  Months    Status  Achieved      OT LONG TERM GOAL  #13   TITLE   Patient will be able to complete 2 1/2 minutes straight with left shoulder flexion simulating driving with a stearing wheel ball to be able to drive to college.     Baseline  Patient has not been driving, her family takes her to school and drops her off.     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     Baseline  Continued difficulty coordinating the use of both hands to complete task, speed still an issue 11/23/17    Status  Partially Met      OT LONG TERM GOAL  #15   TITLE  Patient will complete meal preparation with minimal assist.     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 Pt. reports that her vehicle will be prchased in the coming weeks, then they will have the modifications performed. Pt. reports they are waiting for the bathroom modifications at this time.  Pt. continues to work on improving Bilateral hand strength, and coordination skills.      Occupational Profile and client history currently impacting functional performance  decreased hand strength, balance and coordination.    Occupational performance deficits (Please refer to evaluation for details):  ADL's;IADL's;Leisure;Education    Rehab Potential  Good    OT Frequency  1x / week    OT Duration  12 weeks    OT Treatment/Interventions  Self-care/ADL training;DME and/or AE instruction;Therapeutic activities;Therapeutic exercise;Neuromuscular education;Patient/family education    Plan  Occupational Therapy 1 x per week for ADL and strenghtening.    OT Home Exercise Plan  try elastic shoe laces and new tech for bra donning and doffing     Consulted and Agree with Plan of Care  Patient       Patient will benefit from skilled therapeutic intervention in order to improve the following deficits and impairments:  Impaired UE functional use, Difficulty walking, Decreased strength, Decreased coordination, Decreased endurance, Decreased balance  Visit Diagnosis: Muscle weakness (generalized)  Other lack of coordination    Problem List There are no active problems to display for this patient.   Harrel Carina, MS, OTR/L 05/04/2018, 11:27 AM  [place] MAIN [facility] SERVICES [address] [place], [place], [address] Phone: [phone]   Fax:  [phone]  Name: Michelle Randall MRN: [id number] Date of Birth: June 13, 1996

## 2018-05-08 ENCOUNTER — Ambulatory Visit: Payer: Medicaid Other

## 2018-05-09 ENCOUNTER — Ambulatory Visit: Payer: Medicaid Other

## 2018-05-09 ENCOUNTER — Ambulatory Visit: Payer: Medicaid Other | Admitting: Occupational Therapy

## 2018-05-24 ENCOUNTER — Ambulatory Visit: Payer: Medicaid Other | Admitting: Occupational Therapy

## 2018-05-24 ENCOUNTER — Ambulatory Visit: Payer: Medicaid Other | Attending: Student

## 2018-05-24 DIAGNOSIS — M6281 Muscle weakness (generalized): Secondary | ICD-10-CM | POA: Diagnosis present

## 2018-05-24 DIAGNOSIS — R262 Difficulty in walking, not elsewhere classified: Secondary | ICD-10-CM

## 2018-05-24 DIAGNOSIS — R278 Other lack of coordination: Secondary | ICD-10-CM | POA: Diagnosis present

## 2018-05-24 NOTE — Therapy (Signed)
[place] MAIN [facility] SERVICES [address] [place], [place], [address] Phone: [phone]   Fax:  [phone]  Physical Therapy Treatment  Patient Details  Name: Michelle Randall MRN: [id number] Date of Birth: 1996-11-04 Referring Provider: Langley Gauss MD   Encounter Date: 05/24/2018  PT End of Session - 05/24/18 1018    Visit Number  132    Number of Visits  146    Date for PT Re-Evaluation  06/21/18    Authorization Type  3/12    Authorization Time Period  12 visits 5/29-8/20    PT Start Time  3235    PT Stop Time  1100    PT Time Calculation (min)  46 min    Equipment Utilized During Treatment  Gait belt    Activity Tolerance  Patient tolerated treatment well    Behavior During Therapy  [facility] for tasks assessed/performed       History reviewed. No pertinent past medical history.  History reviewed. No pertinent surgical history.  There were no vitals filed for this visit.  Subjective Assessment - 05/24/18 1016    Subjective  Patient has returned from beach and [place] two days ago. Reports it was very busy, was her first time going abroad without  family Reports it was very accessible.     Patient is accompained by:  Family member    Limitations  Walking    Patient Stated Goals  Patient wants to improve her core strength.     Currently in Pain?  No/denies       NuStep Lvl 3 4 minutes with cues for staying >60 bpm for cardiovascular support   Step over and back green band on floor to promote increased step length and control of foot placement. 12x each LE  Side step over green band on floor in // bars, BUE support 12x each LE  Airex pad : balance:2 minutes occasional finger tip support to prevent LOB    Prone : hamstring/hip flexor stretch 2 minutes per side.    Bridge 10x with PT support at feet/.   LE rotation low back stretch 2 minutes   Supine hooklying marches 10x      hooklying abduction 10x    Posterior  pelvic tilts 10x 3 second holds  with TrA activation     Pt. response to medical necessity: . Patient will continue to benefit from skilled physical therapy in order to maximize functional mobility and safety.                           PT Education - 05/24/18 1018    Education provided  Yes    Education Details  exercise technique     Person(s) Educated  Patient    Methods  Explanation;Demonstration;Verbal cues    Comprehension  Returned demonstration;Verbalized understanding          PT Long Term Goals - 05/04/18 1026      PT LONG TERM GOAL #1   Title  Patient will improve Dynamic Gait Index (DGI) score to > 21/24 for meaningful improvement and low falls risk regarding dynamic walking tasks (revised from > 19/24 )    Baseline  14/24; 11/02/16: 15/24 01/28/17: 18/24 6/5: 19/24; 08/23/17 14/24,  14/24 10/11/17 14/24 12/20/17, 12/27/17/  14/24    Time  12    Period  Weeks    Status  On-going      PT LONG TERM  GOAL #2   Title  Patient will increase Berg Balance score by >51/56 points to be considered a low risk for falls for improved safety. (revised from >6 points improvement)    Baseline  11/02/16: 34/56 01/28/17: 39/56 6/5: 45/56; 08/23/2017 = 45/56, 43/56 10/11/17, 43/56 12/20/17, 12/27/17  43/56 6/20: 38/56 no UE support     Time  12    Period  Weeks    Status  On-going      PT LONG TERM GOAL #3   Title  Patient will be able to transfer in and out of a large car with  a high seat with CGA  to improve ability to go to school/doctor visits.     Baseline  Patient requires min A for transfer into large SUV unless it has a step rail on the side; 5/15: pt requires assist with a really high car but can get out of an SUV without assist, typically needs more assits getting in and getting in on the L side; 6/20: if ledge able to do with little to no assistance.     Time  12    Period  Weeks    Status  Partially Met      PT LONG TERM GOAL #4   Title  Patient will  complete a TUG test in < 12 seconds for independent mobility and decreased fall risk     Baseline  11.45; 08/23/2017 = 13.66 sec, 13.40 12/20/17, 12/27/17 12.07 sec; 5/15: 13.42 seconds 6/20: 12 seconds    Time  12    Period  Weeks    Status  Achieved      PT LONG TERM GOAL #5   Title  Patient will improve 6 minute walk distance by > 150 ft for improved return to functional community activities     Baseline  740 01/28/2017: 756f 6/5: 795  730 on 07/19/17; 790 ft on 08/23/18,  740 feet, 730 feet 12/20/17, 12/27/17 650 feet 6/20: 665    Time  12    Period  Weeks    Status  On-going      PT LONG TERM GOAL #6   Title  Patient will improve gait speed to > 1.2 m/s with least restrictive assistive device to return to normal walking speed     Baseline  .76 m/h 01/28/2017: .87  07/19/17 0.654m; 0.77 m/s on 08/23/17, . 58 m/sec 10/11/17 .6617mc 6/20: 77 m/s     Time  12    Period  Weeks    Status  On-going 03/21/18 target dat      PT LONG TERM GOAL #7   Title  Patient (< 60 4ars old) will complete five times sit to stand test in < 10 seconds indicating an increased LE strength and improved balance     Baseline  9.45 sec; 9.63 on 08/23/17, 9.63 sec 12/27/17, 10.07 sec    Time  12    Period  Weeks    Status  Achieved 03/26/18 target date      [place]   Title  Patient will be able to ambulate on inclines and grass independenlty with LRAD    Baseline  Can ambulate over grassy inclines with decreased speed and safety requiring need loftstrand curtches and SBA; 5/15: pt reports she has to walk slowly and feels unsteady but that this is improving6/20 : reports cautious and slow walk, able to do mod I    Time  12    Period  Weeks    Status  On-going 03/21/18 target date      PT LONG TERM GOAL  #9   TITLE  Patient will be abe to transfer from low chair or stool without UE support independently    Baseline  Patient needs min A assist to transfer from a low stool    Time  12    Period  Weeks    Status   Partially Met 03/21/18 target date      PT LONG TERM GOAL  #10   TITLE  Patient will be able to transfer from the floor to standing independently with use of LRAD    Baseline  Patient able to stand from the floor independently if she has not been on the floor longer than about 5 min due to stiffness with prolonged positioning; 6/20: challenging due to feet falling alseep     Time  12    Period  Weeks    Status  On-going 03/21/18 target date      PT LONG TERM GOAL  #11   TITLE  Patient will step up onto a 6" step 5x with AD independently to increase community mobility    Baseline  6/20: challenging to patient ascending steps, supervision     Time  12    Period  Weeks    Status  On-going 01/03/18 target date            Plan - 05/24/18 1106    Clinical Impression Statement  Patient has returned from trip abroad. Demonstrated good compliance with equal step length and ability to negotiate obstacles in standing. Static balance results in excessive lordosis that was relieved with cueing for transverse abdominus activation. Stretching continues to relieve patient's aches combined with posterior pelvic tilts with TrA activation for neutralizing spinal alignment. Patient will continue to benefit from skilled physical therapy in order to maximize functional mobility and safety.    Rehab Potential  Good    Clinical Impairments Affecting Rehab Potential  weakness and decreased standing balance    PT Frequency  1x / week    PT Duration  12 weeks    PT Treatment/Interventions  Therapeutic exercise;Therapeutic activities;Gait training;Balance training;Stair training;DME Instruction;Neuromuscular re-education;Patient/family education    PT Next Visit Plan  Continue POC    PT Home Exercise Plan  added seated dynamic core UE and LE lift offs with visual cues using mirror, supine marches       Patient will benefit from skilled therapeutic intervention in order to improve the following deficits and  impairments:  Abnormal gait, Decreased balance, Decreased endurance, Difficulty walking, Decreased strength  Visit Diagnosis: Muscle weakness (generalized)  Other lack of coordination  Difficulty in walking, not elsewhere classified     Problem List There are no active problems to display for this patient.  Janna Arch, PT, DPT   05/24/2018, 11:07 AM  [place] MAIN [facility] SERVICES [address] [place], [place], [address] Phone: [phone]   Fax:  [phone]  Name: CHEALSEY MIYAMOTO MRN: [id number] Date of Birth: 02-05-1996

## 2018-05-24 NOTE — Therapy (Signed)
[place] MAIN [facility] SERVICES [address] [place], [place], [address] Phone: [phone]   Fax:  [phone]  Occupational Therapy Treatment/Recertification   Patient Details  Name: Michelle Randall MRN: [id number] Date of Birth: Dec 19, 1995 Referring Provider: Langley Gauss MD   Encounter Date: 05/24/2018  OT End of Session - 05/25/18 1605    Visit Number  121    Number of Visits  142    Date for OT Re-Evaluation  08/10/18    OT Start Time  1100    OT Stop Time  1146    OT Time Calculation (min)  46 min    Activity Tolerance  Patient tolerated treatment well    Behavior During Therapy  [facility] for tasks assessed/performed       History reviewed. No pertinent past medical history.  History reviewed. No pertinent surgical history.  There were no vitals filed for this visit.  Subjective Assessment - 05/25/18 1604    Subjective   Patient has been traveling recently, went to the beach with her family and then she took a trip with [employer] to [place] for a week for a school project.      Patient Stated Goals  To be as independent as possible at home and at school.     Currently in Pain?  No/denies    Pain Score  0-No pain    Multiple Pain Sites  No      Reassessment of skills as follows: Right grip 45#, left 35# Right lateral pinch 13#, L 11# 3 point pinch right 17 #, left 16# 2 point pinch right 8#, left 7#  9 hole peg test right 22 secs Left 25 secs  BUE  Strength 4/5 overall at the shoulder, elbow, wrist  Patient seen for resistive pinch strength with all levels to place onto elevated surface to encourage increased reach.  Manipulation of [place] discs with right and left hands then performed simultaneously with emphasis on speed, dexterity and coordination with cues for technique.   Kitchen task assessment:  Difficulty with kitchen tasks, being able to reach items higher on shelves, has difficulty with mixing  thicker batter When scrambling eggs, she has a difficult time wisking them in the bowl to prepare for the pan.   Increased difficulty with cutting harder vegetables and meat, such as carrots.  She is able to cut fruits without difficulty. Difficulty with carrying larger bowls to the sink to wash, especially if they are glass.  She has some difficulty with trying to arrange the dishes in the dishwasher. She also has some trouble with wringing out a rag to wipe the countertops, and with filling a jug/pitcher and carrying it to the table.     Self care tasks:  Focus needs to be on speed of performance to get ready on time  School:  Everything is going well, no identified needs currently.  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   Driving:  She is currently doing some short distance driving at times, but cannot do long distance driving due to difficulty holding the steering wheel or adapted knob  She will be planning to get a wheelchair accessible van soon.    Work:  She is doing an Art therapist currently.  No needs identified currently.   She has some difficulty with opening the mailbox at times if it is closed tightly.     Will need to work more on balance and safety with daily tasks  such as bed making, kitchen tasks.                 OT Education - 05/25/18 1604    Education provided  Yes    Education Details  plan of care, goals, self care assessment    Person(s) Educated  Patient    Methods  Explanation    Comprehension  Verbalized understanding;Returned demonstration;Tactile cues required          OT Long Term Goals - 05/25/18 1606      OT LONG TERM GOAL #1   Title  Patient will demonstrate increased strength in right hand sufficient to scoop ice cream from frozen container with modified independence.    Baseline  update 05/24/2018:  pt has not had ice cream in a while, she will try soon.    Time  12    Period  Weeks    Status  On-going      OT LONG  TERM GOAL #2   Title  Patient will be able to make her bed with modified independence.    Baseline  update 05/24/2018:  still takes her an extended period of time, and occasionally requires help with heavier comforter    Time  6    Period  Weeks    Status  On-going      OT LONG TERM GOAL #3   Title  Patient will complete back closure bra donning with modified independence.     Baseline  max assist at eval    Time  12    Period  Weeks    Status  Achieved      OT LONG TERM GOAL #4   Title  Patient will increase BUE strength by 1 mm grade to be able to self propel her wheelchair over carpet for distances greater than 300 feet    Baseline  update 05/24/2018:  she is able to go longer distances up to 200 feet but has difficulty with rocky, uneven areas.     Time  12    Period  Weeks    Status  On-going      OT LONG TERM GOAL #5   Title  Patient will demonstrate the ability to perform small buttons on shirts independently and with good speed in order to dress for school.    Baseline  goal update: Patient can perform with increased time but occasionally needs assist if rushed, still difficult at goal update 11/23/2017    Time  12    Period  Weeks    Status  Achieved      OT LONG TERM GOAL #6   Title  Patient will donn and doff small earrings with modified independence.    Baseline  update 05/24/2018:  small earrings are still difficult and she has not worn in a while     Time  12    Period  Weeks    Status  Partially Met      OT LONG TERM GOAL #7   Title  Patient will demonstrate cutting vegetables with modified independence with use of adapted equipment and modified techniques    Baseline  unable to cut hard vegetables such as carrots 05/24/18    Time  8    Period  Weeks    Status  New      OT LONG TERM GOAL #8   Title  Patient to complete am self care demonstrating efficient methods of dressing skills to get to school on time.  Baseline  Goal update:  patient reports it takes  about an hour to get dressed, running late one time this week.  Still working on efficiency and speed of tasks    Time  12    Period  Weeks    Status  Achieved      OT LONG TERM GOAL  #9   Baseline  Patient will don/doff socks and AFO braces with modified independence using adaptive equipment as needed.  Socks and braces are more difficult after showering and if socks are new, otherwise she can do it.     Time  12    Period  Weeks    Status  Achieved      OT LONG TERM GOAL  #10   TITLE  Patient will demonstrate the ability to pull jeans down over AFOs with modified independence.     Baseline  update 05/24/2018:  more difficulty if she wears skinny jeans.    Time  12    Period  Weeks    Status  On-going      OT LONG TERM GOAL  #11   TITLE  Patient will demonstrate moving objects in the kitchen from one surface to another with modified independence and good safety techniques.     Baseline  unable to move larger bowls from one place to another.  05/24/2018    Time  12    Period  Weeks    Status  New      OT LONG TERM GOAL  #12   TITLE  Patient will demonstrate increased grip strength to be able to wring out kitchen cloth sufficiently so water does not drip onto the floor and cause a slip hazard.     Baseline  05/24/18:has difficulty getting all the water out of cloth 05/24/2018    Time  8    Period  Weeks    Status  New      OT LONG TERM GOAL  #13   TITLE  Patient will demonstrate ability to use whisk to scramble eggs independently.    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     Baseline  Continued difficulty coordinating the use of both hands to complete task, speed still an issue 11/23/17    Status  Achieved      OT LONG TERM GOAL  #15    TITLE  Patient will complete meal preparation with minimal assist.     Baseline  update:  continues to require moderate assist with meal prep    Time  12    Period  Weeks    Status  On-going            Plan - 05/25/18 1606    Clinical Impression Statement  Patient has continued to make excellent progress in the last few months.  She has been able to travel internationally with a group of students and able to perform basic self care tasks with modified independence.  She had to accomodate for some tasks such as allowing increased time in the am to get herself ready, wear looser pants to be able to get pants over AFO, and  could manage short distances with self propelling in her wheelchair.  She was able to get help with nagivating longer distances in the wheelchair and when navigating over uneven surfaces in [place].  She is currently enrolled in a class at college over the summer as well as participating in an internship.  She will be enrolled in college full time in the fall.  She is planning to start driving more and is working on getting a modified Lucianne Lei to drive and working to see if she needs any other accomodations for driving.  She has improved in her overall strength and pinch but still has difficulty with tasks in the kitchen such as preparing food, stirring a bowl with batter (increase resistance), cutting meat and vegetables, moving items such as bowls and plates from one surface to another.  She has difficulty with using a whisk to scramble eggs, wringing out a cloth and filling a jug and carrying it to the table. She is becoming more independent with basic self care tasks and working towards greater independence with IADL tasks since she is eventually planning to live alone.  She continues to benefit from skilled OT to maximize safety and independence in necessary daily tasks.  Will plan to focus on strategies and/or use of adaptive equipment for kitchen tasks, continued focus on strength and  coordination to perform ADL/IADL tasks, manage small objects such as jewelry/earrings.  Patient currently lives with her family and training will be with patient directly since she is working on independence to transition to independent living.      Occupational Profile and client history currently impacting functional performance  decreased hand strength, balance and coordination.    Occupational performance deficits (Please refer to evaluation for details):  ADL's;IADL's;Leisure;Education    Rehab Potential  Good    OT Frequency  1x / week    OT Duration  12 weeks    OT Treatment/Interventions  Self-care/ADL training;DME and/or AE instruction;Therapeutic activities;Therapeutic exercise;Neuromuscular education;Patient/family education    Consulted and Agree with Plan of Care  Patient       Patient will benefit from skilled therapeutic intervention in order to improve the following deficits and impairments:  Impaired UE functional use, Difficulty walking, Decreased strength, Decreased coordination, Decreased endurance, Decreased balance  Visit Diagnosis: Muscle weakness (generalized)  Other lack of coordination    Problem List There are no active problems to display for this patient.  Achilles Dunk, OTR/L, CLT  Lovett,Amy 05/25/2018, 4:57 PM  [place] MAIN [facility] SERVICES [address] [place], [place], [address] Phone: [phone]   Fax:  [phone]  Name: Michelle Randall MRN: [id number] Date of Birth: 01-02-1996

## 2018-05-25 ENCOUNTER — Encounter: Payer: Self-pay | Admitting: Occupational Therapy

## 2018-05-31 ENCOUNTER — Ambulatory Visit: Payer: Medicaid Other

## 2018-05-31 ENCOUNTER — Ambulatory Visit: Payer: Medicaid Other | Admitting: Occupational Therapy

## 2018-06-07 ENCOUNTER — Ambulatory Visit: Payer: Medicaid Other | Admitting: Occupational Therapy

## 2018-06-07 ENCOUNTER — Ambulatory Visit: Payer: Medicaid Other

## 2018-06-14 ENCOUNTER — Ambulatory Visit: Payer: Medicaid Other

## 2018-06-14 ENCOUNTER — Ambulatory Visit: Payer: Medicaid Other | Admitting: Occupational Therapy

## 2018-06-14 ENCOUNTER — Encounter: Payer: Self-pay | Admitting: Occupational Therapy

## 2018-06-14 DIAGNOSIS — M6281 Muscle weakness (generalized): Secondary | ICD-10-CM | POA: Diagnosis not present

## 2018-06-14 DIAGNOSIS — R278 Other lack of coordination: Secondary | ICD-10-CM

## 2018-06-14 DIAGNOSIS — R262 Difficulty in walking, not elsewhere classified: Secondary | ICD-10-CM

## 2018-06-14 NOTE — Therapy (Signed)
[place] MAIN [facility] SERVICES [address] [place], [place], [address] Phone: [phone]   Fax:  [phone]  Physical Therapy Treatment  Patient Details  Name: Michelle Randall MRN: [id number] Date of Birth: [birth year] Referring Provider: Langley Gauss MD   Encounter Date: 06/14/2018  PT End of Session - 06/14/18 1218    Visit Number  133    Number of Visits  146    Date for PT Re-Evaluation  06/21/18    Authorization Type  4/12    Authorization Time Period  12 visits 5/29-8/20    PT Start Time  8270    PT Stop Time  1100    PT Time Calculation (min)  45 min    Equipment Utilized During Treatment  Gait belt    Activity Tolerance  Patient tolerated treatment well    Behavior During Therapy  [facility] for tasks assessed/performed       History reviewed. No pertinent past medical history.  History reviewed. No pertinent surgical history.  There were no vitals filed for this visit.  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 Reports compliance with her HEPs. Still having difficulty with curbs.     Patient is accompained by:  Family member    Limitations  Walking    Patient Stated Goals  Patient wants to improve her core strength.     Currently in Pain?  No/denies           Nustep Lvl 3 3 minutes with cues for >60 bpm for cardiovascular support  6" step ups in // bars with BUE support. Manual/tactile assistance on R quadricep with verbal cueing for knee flexion of LLE for lifting L LE onto step with right.  16x with improved coordination with repetition  Airex pad: balance 6 minutes with no UE support with noted trunk sway for stability  Airex pad: horizontal head turns 2 minutes  Prone: Hamstring curl/hip flexor stretch 3 minutes per side  Supine: Hooklying abduction 1 leg at a time 10x each leg LE rotation low back stretch 2  minutes Posterior pelvic tilts 10x 3 second holds with TrA activation Hooklying swiss ball deadbug arms only 10x, legs only 10x                      PT Education - 06/14/18 1218    Education provided  Yes    Education Details  exercise technique     Person(s) Educated  Patient    Methods  Explanation;Demonstration;Verbal cues    Comprehension  Verbalized understanding;Returned demonstration          PT Long Term Goals - 05/04/18 1026      PT LONG TERM GOAL #1   Title  Patient will improve Dynamic Gait Index (DGI) score to > 21/24 for meaningful improvement and low falls risk regarding dynamic walking tasks (revised from > 19/24 )    Baseline  14/24; 11/02/16: 15/24 01/28/17: 18/24 6/5: 19/24; 08/23/17 14/24,  14/24 10/11/17 14/24 12/20/17, 12/27/17/  14/24    Time  12    Period  Weeks    Status  On-going      PT LONG TERM GOAL #2   Title  Patient will increase Berg Balance score by >51/56 points to be considered a low risk for falls for improved safety. (revised from >6 points improvement)    Baseline  11/02/16: 34/56 01/28/17: 54/62 6/5: 45/56; 08/23/2017 = 45/56, 43/56 10/11/17, 43/56 12/20/17, 12/27/17  43/56 6/20: 38/56 no UE support     Time  12    Period  Weeks    Status  On-going      PT LONG TERM GOAL #3   Title  Patient will be able to transfer in and out of a large car with  a high seat with CGA  to improve ability to go to school/doctor visits.     Baseline  Patient requires min A for transfer into large SUV unless it has a step rail on the side; 5/15: pt requires assist with a really high car but can get out of an SUV without assist, typically needs more assits getting in and getting in on the L side; 6/20: if ledge able to do with little to no assistance.     Time  12    Period  Weeks    Status  Partially Met      PT LONG TERM GOAL #4   Title  Patient will complete a TUG test in < 12 seconds for independent mobility and decreased fall risk     Baseline   11.45; 08/23/2017 = 13.66 sec, 13.40 12/20/17, 12/27/17 12.07 sec; 5/15: 13.42 seconds 6/20: 12 seconds    Time  12    Period  Weeks    Status  Achieved      PT LONG TERM GOAL #5   Title  Patient will improve 6 minute walk distance by > 150 ft for improved return to functional community activities     Baseline  740 01/28/2017: 777f 6/5: 795  730 on 07/19/17; 790 ft on 08/23/18,  740 feet, 730 feet 12/20/17, 12/27/17 650 feet 6/20: 665    Time  12    Period  Weeks    Status  On-going      PT LONG TERM GOAL #6   Title  Patient will improve gait speed to > 1.2 m/s with least restrictive assistive device to return to normal walking speed     Baseline  .76 m/h 01/28/2017: .87  07/19/17 0.678m; 0.77 m/s on 08/23/17, . 58 m/sec 10/11/17 .6646mc 6/20: 77 m/s     Time  12    Period  Weeks    Status  On-going 03/21/18 target dat      PT LONG TERM GOAL #7   Title  Patient (< 60 66ars old) will complete five times sit to stand test in < 10 seconds indicating an increased LE strength and improved balance     Baseline  9.45 sec; 9.63 on 08/23/17, 9.63 sec 12/27/17, 10.07 sec    Time  12    Period  Weeks    Status  Achieved 03/26/18 target date      PT [place]   Title  Patient will be able to ambulate on inclines and grass independenlty with LRAD    Baseline  Can ambulate over grassy inclines with decreased speed and safety requiring need loftstrand curtches and SBA; 5/15: pt reports she has to walk slowly and feels unsteady but that this is improving6/20 : reports cautious and slow walk, able to do mod I    Time  12    Period  Weeks    Status  On-going 03/21/18 target date      PT LONG TERM GOAL  #9   TITLE  Patient will be abe to transfer from low chair or stool  without UE support independently    Baseline  Patient needs min A assist to transfer from a low stool    Time  12    Period  Weeks    Status  Partially Met 03/21/18 target date      PT LONG TERM GOAL  #10   TITLE  Patient will be able to  transfer from the floor to standing independently with use of LRAD    Baseline  Patient able to stand from the floor independently if she has not been on the floor longer than about 5 min due to stiffness with prolonged positioning; 6/20: challenging due to feet falling alseep     Time  12    Period  Weeks    Status  On-going 03/21/18 target date      PT LONG TERM GOAL  #11   TITLE  Patient will step up onto a 6" step 5x with AD independently to increase community mobility    Baseline  6/20: challenging to patient ascending steps, supervision     Time  12    Period  Weeks    Status  On-going 01/03/18 target date            Plan - 06/14/18 1228    Clinical Impression Statement  Patient demonstrated good static balance on unstable surface however was challenged by including dynamic head turns. Step ups to break apart the challenge of curbs improved with repetition with use of tactile cuing to R quadriceps and verbal cues for abducting and flexion L Knee for clearance onto step. Patient will continue to benefit from skilled physical therapy in order to maximize functional mobility and safety    Rehab Potential  Good    Clinical Impairments Affecting Rehab Potential  weakness and decreased standing balance    PT Frequency  1x / week    PT Duration  12 weeks    PT Treatment/Interventions  Therapeutic exercise;Therapeutic activities;Gait training;Balance training;Stair training;DME Instruction;Neuromuscular re-education;Patient/family education    PT Next Visit Plan  Continue POC    PT Home Exercise Plan  added seated dynamic core UE and LE lift offs with visual cues using mirror, supine marches       Patient will benefit from skilled therapeutic intervention in order to improve the following deficits and impairments:  Abnormal gait, Decreased balance, Decreased endurance, Difficulty walking, Decreased strength  Visit Diagnosis: Muscle weakness (generalized)  Other lack of  coordination  Difficulty in walking, not elsewhere classified     Problem List There are no active problems to display for this patient.  Janna Arch, PT, DPT   06/14/2018, 12:29 PM  [place] MAIN [facility] SERVICES [address] [place], [place], [address] Phone: [phone]   Fax:  [phone]  Name: Michelle Randall MRN: [id number] Date of Birth: 10-29-1996

## 2018-06-14 NOTE — Therapy (Signed)
[place] MAIN [facility] SERVICES [address] [place], [place], [address] Phone: [phone]   Fax:  [phone]  Occupational Therapy Treatment  Patient Details  Name: Michelle Randall MRN: [id number] Date of Birth: [birth year] Referring Provider: Langley Gauss MD   Encounter Date: 06/14/2018  OT End of Session - 06/14/18 1108    Visit Number  122    Number of Visits  142    Date for OT Re-Evaluation  08/10/18    Authorization Type  medicaid visit 59 of 118    OT Start Time  1103    OT Stop Time  1145    OT Time Calculation (min)  42 min    Activity Tolerance  Patient tolerated treatment well    Behavior During Therapy  [facility] for tasks assessed/performed       History reviewed. No pertinent past medical history.  History reviewed. No pertinent surgical history.  There were no vitals filed for this visit.  Subjective Assessment - 06/14/18 1107    Subjective   Pt. reports she has a break now.    Patient is accompained by:  Family member    Patient Stated Goals  To be as independent as possible at home and at school.     Currently in Pain?  No/denies      OT TREATMENT    Neuro muscular re-education:  Pt. worked on [employer], and manipulating small objects. Pt. worked on bilateral [facility] skills needed to grasp small resistive beads. Pt. worked on connecting the beads using a 3pt. pinch, and pt. Pinch grasp. Pt. worked on disconnecting the resistive beads using a lateral pinch grasp, and 3pt. pinch grasp. Pt. Dropped several of the smooth circular beads when disconnecting them. Pt. Worked on manipulating 1/16" tiny beads, and weaving them onto a small dowel. Pt. Worked on grasping mini clips and positioning them onto an elevated surface. Pt. Worked on removing them by using thumb opposition alternating to the tip of her thumb through 5th digits.                      OT Education - 06/14/18 1108    Education provided   Yes    Education Details  plan of care, goals, self care assessment    Person(s) Educated  Patient    Methods  Explanation    Comprehension  Verbalized understanding;Returned demonstration;Tactile cues required          OT Long Term Goals - 05/25/18 1606      OT LONG TERM GOAL #1   Title  Patient will demonstrate increased strength in right hand sufficient to scoop ice cream from frozen container with modified independence.    Baseline  update 05/24/2018:  pt has not had ice cream in a while, she will try soon.    Time  12    Period  Weeks    Status  On-going      OT LONG TERM GOAL #2   Title  Patient will be able to make her bed with modified independence.    Baseline  update 05/24/2018:  still takes her an extended period of time, and occasionally requires help with heavier comforter    Time  6    Period  Weeks    Status  On-going      OT LONG TERM GOAL #3   Title  Patient will complete back closure bra donning with modified independence.  Baseline  max assist at eval    Time  12    Period  Weeks    Status  Achieved      OT LONG TERM GOAL #4   Title  Patient will increase BUE strength by 1 mm grade to be able to self propel her wheelchair over carpet for distances greater than 300 feet    Baseline  update 05/24/2018:  she is able to go longer distances up to 200 feet but has difficulty with rocky, uneven areas.     Time  12    Period  Weeks    Status  On-going      OT LONG TERM GOAL #5   Title  Patient will demonstrate the ability to perform small buttons on shirts independently and with good speed in order to dress for school.    Baseline  goal update: Patient can perform with increased time but occasionally needs assist if rushed, still difficult at goal update 11/23/2017    Time  12    Period  Weeks    Status  Achieved      OT LONG TERM GOAL #6   Title  Patient will donn and doff small earrings with modified independence.    Baseline  update 05/24/2018:  small  earrings are still difficult and she has not worn in a while     Time  12    Period  Weeks    Status  Partially Met      OT LONG TERM GOAL #7   Title  Patient will demonstrate cutting vegetables with modified independence with use of adapted equipment and modified techniques    Baseline  unable to cut hard vegetables such as carrots 05/24/18    Time  8    Period  Weeks    Status  New      OT LONG TERM GOAL #8   Title  Patient to complete am self care demonstrating efficient methods of dressing skills to get to school on time.     Baseline  Goal update:  patient reports it takes about an hour to get dressed, running late one time this week.  Still working on efficiency and speed of tasks    Time  12    Period  Weeks    Status  Achieved      OT LONG TERM GOAL  #9   Baseline  Patient will don/doff socks and AFO braces with modified independence using adaptive equipment as needed.  Socks and braces are more difficult after showering and if socks are new, otherwise she can do it.     Time  12    Period  Weeks    Status  Achieved      OT LONG TERM GOAL  #10   TITLE  Patient will demonstrate the ability to pull jeans down over AFOs with modified independence.     Baseline  update 05/24/2018:  more difficulty if she wears skinny jeans.    Time  12    Period  Weeks    Status  On-going      OT LONG TERM GOAL  #11   TITLE  Patient will demonstrate moving objects in the kitchen from one surface to another with modified independence and good safety techniques.     Baseline  unable to move larger bowls from one place to another.  05/24/2018    Time  12    Period  Weeks    Status  New      OT LONG TERM GOAL  #12   TITLE  Patient will demonstrate increased grip strength to be able to wring out kitchen cloth sufficiently so water does not drip onto the floor and cause a slip hazard.     Baseline  05/24/18:has difficulty getting all the water out of cloth 05/24/2018    Time  8    Period  Weeks     Status  New      OT LONG TERM GOAL  #13   TITLE  Patient will demonstrate ability to use whisk to scramble eggs independently.    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     Baseline  Continued difficulty coordinating the use of both hands to complete task, speed still an issue 11/23/17    Status  Achieved      OT LONG TERM GOAL  #15   TITLE  Patient will complete meal preparation with minimal assist.     Baseline  update:  continues to require moderate assist with meal prep    Time  12    Period  Weeks    Status  On-going            Plan - 06/14/18 1108    Clinical Impression Statement Pt. 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 Pt. now has a break for a few weeks until classes resume. Pt. Continues to have difficulty with [facility] skills, and manipulating small objects. Pt. continues to work on improving [facility] skills, and UE strength needed for improved ADL, and IADL functioning.     Occupational Profile and client history currently impacting functional performance  decreased hand strength, balance and coordination.    Occupational performance deficits (Please refer to evaluation for details):  ADL's;IADL's;Leisure;Education    Rehab Potential  Good    OT Frequency  1x / week    OT Duration  12 weeks    OT Treatment/Interventions  Self-care/ADL training;DME and/or AE instruction;Therapeutic activities;Therapeutic exercise;Neuromuscular education;Patient/family education    Plan  Occupational Therapy 1 x per week for ADL and strenghtening.    OT Home Exercise Plan  try elastic shoe laces and new tech for bra donning and doffing    Consulted and Agree with Plan of Care   Patient    Family Member Consulted  mom       Patient will benefit from skilled therapeutic intervention in order to improve the following deficits and impairments:  Impaired UE functional use, Difficulty walking, Decreased strength, Decreased coordination, Decreased endurance, Decreased balance  Visit Diagnosis: Muscle weakness (generalized)  Other lack of coordination    Problem List There are no active problems to display for this patient.   Harrel Carina, MS,  OTR/L 06/14/2018, 11:48 AM  [place] MAIN [facility] SERVICES [address] [place], [place], [address] Phone: [phone]   Fax:  [phone]  Name: Michelle Randall MRN: [id number] Date of Birth: 1996-04-30

## 2018-06-22 ENCOUNTER — Encounter: Payer: Self-pay | Admitting: Occupational Therapy

## 2018-06-22 ENCOUNTER — Ambulatory Visit: Payer: Medicaid Other | Attending: Student

## 2018-06-22 ENCOUNTER — Ambulatory Visit: Payer: Medicaid Other | Admitting: Occupational Therapy

## 2018-06-22 DIAGNOSIS — R460 Very low level of personal hygiene: Secondary | ICD-10-CM | POA: Insufficient documentation

## 2018-06-22 DIAGNOSIS — R262 Difficulty in walking, not elsewhere classified: Secondary | ICD-10-CM

## 2018-06-22 DIAGNOSIS — M6281 Muscle weakness (generalized): Secondary | ICD-10-CM

## 2018-06-22 DIAGNOSIS — Z9181 History of falling: Secondary | ICD-10-CM | POA: Insufficient documentation

## 2018-06-22 DIAGNOSIS — R278 Other lack of coordination: Secondary | ICD-10-CM

## 2018-06-22 DIAGNOSIS — R531 Weakness: Secondary | ICD-10-CM | POA: Diagnosis present

## 2018-06-22 DIAGNOSIS — R279 Unspecified lack of coordination: Secondary | ICD-10-CM | POA: Insufficient documentation

## 2018-06-22 NOTE — Therapy (Signed)
[place] MAIN [facility] SERVICES [address] [place], [place], [address] Phone: [phone]   Fax:  [phone]  Physical Therapy Treatment  Patient Details  Name: Michelle Randall MRN: [id number] Date of Birth: 03/28/1996 Referring Provider: Langley Gauss MD   Encounter Date: 06/22/2018  PT End of Session - 06/22/18 1530    Visit Number  [id number]    Number of Visits  147    Date for PT Re-Evaluation  09/14/18    Authorization Type  5/12    Authorization Time Period  12 visits 5/29-8/20    PT Start Time  1435    PT Stop Time  1515    PT Time Calculation (min)  40 min    Equipment Utilized During Treatment  Gait belt    Activity Tolerance  Patient tolerated treatment well    Behavior During Therapy  [facility] for tasks assessed/performed       History reviewed. No pertinent past medical history.  History reviewed. No pertinent surgical history.  There were no vitals filed for this visit.  Subjective Assessment - 06/22/18 1510    Subjective  Patient presents to therapy with no falls or c/o of pain. Reports compliance with HEP. Reports challenges include curbs, walking across grass, and prolonged walking.     Patient is accompained by:  Family member    Limitations  Walking    Patient Stated Goals  Patient wants to improve her core strength.     Currently in Pain?  No/denies         [facility] PT Assessment - 06/22/18 0001      Berg Balance Test   Sit to Stand  Able to stand without using hands and stabilize independently    Standing Unsupported  Able to stand safely 2 minutes    Sitting with Back Unsupported but Feet Supported on Floor or Stool  Able to sit safely and securely 2 minutes    Stand to Sit  Sits safely with minimal use of hands    Transfers  Able to transfer safely, minor use of hands    Standing Unsupported with Eyes Closed  Able to stand 10 seconds safely    Standing Ubsupported with Feet Together  Able to place feet together  independently and stand for 1 minute with supervision    From Standing, Reach Forward with Outstretched Arm  Can reach forward >12 cm safely (5")    From Standing Position, Pick up Object from Floor  Able to pick up shoe, needs supervision    From Standing Position, Turn to Look Behind Over each Shoulder  Looks behind from both sides and weight shifts well    Turn 360 Degrees  Needs close supervision or verbal cueing    Standing Unsupported, Alternately Place Feet on Step/Stool  Able to complete >2 steps/needs minimal assist    Standing Unsupported, One Foot in Front  Needs help to step but can hold 15 seconds    Standing on One Leg  Unable to try or needs assist to prevent fall    Total Score  40      BERG:: 40 /56  6 min walk test: 701 with crutches.   10 MWT: 13 seconds  : difficulty with clearing feet when walking on unstable surface.    Prone: Hamstring curl/hip flexor stretch 3 minutes per side  Supine: LE rotation low back stretch 2 minutes Posterior pelvic tilts 10x 3 second holds with TrA activation Sit up  with PT holding feet raising ball above head at full sit up position. 8x                  PT Education - 06/22/18 1529    Education provided  Yes    Education Details  goal progression, exercise technique     Person(s) Educated  Patient    Methods  Explanation;Demonstration;Verbal cues    Comprehension  Verbalized understanding;Returned demonstration       PT Short Term Goals - 06/22/18 1534      PT SHORT TERM GOAL #1   Title  Patient will report compliance with HEP for continued strengthening and stability during functional mobility.     Baseline  HEP compliant    Time  2    Period  Weeks    Status  New        PT Long Term Goals - 06/22/18 1439      PT LONG TERM GOAL #1   Title  Patient will improve Dynamic Gait Index (DGI) score to > 21/24 for meaningful improvement and low falls risk regarding dynamic walking tasks (revised from > 19/24  )    Baseline  14/24; 11/02/16: 15/24 01/28/17: 18/24 6/5: 19/24; 08/23/17 14/24,  14/24 10/11/17 14/24 12/20/17, 12/27/17/  14/24    Time  12    Period  Weeks    Status  On-going    Target Date  09/14/18      PT LONG TERM GOAL #2   Title  Patient will increase Berg Balance score by >51/56 points to be considered a low risk for falls for improved safety. (revised from >6 points improvement)    Baseline  11/02/16: 34/56 01/28/17: 39/56 6/5: 45/56; 08/23/2017 = 45/56, 43/56 10/11/17, 43/56 12/20/17, 12/27/17  43/56 6/20: 38/56 no UE support ; 8/8: 40/56 no UE support    Time  12    Period  Weeks    Status  On-going    Target Date  09/14/18      PT LONG TERM GOAL #3   Title  Patient will be able to transfer in and out of a large car with  a high seat with CGA  to improve ability to go to school/doctor visits.     Baseline  Patient requires min A for transfer into large SUV unless it has a step rail on the side; 5/15: pt requires assist with a really high car but can get out of an SUV without assist, typically needs more assits getting in and getting in on the L side; 6/20: if ledge able to do with little to no assistance.     Time  12    Period  Weeks    Status  Partially Met      PT LONG TERM GOAL #4   Title  Patient will complete a TUG test in < 12 seconds for independent mobility and decreased fall risk     Baseline  11.45; 08/23/2017 = 13.66 sec, 13.40 12/20/17, 12/27/17 12.07 sec; 5/15: 13.42 seconds 6/20: 12 seconds    Time  12    Period  Weeks    Status  Achieved      PT LONG TERM GOAL #5   Title  Patient will improve 6 minute walk distance by > 150 ft for improved return to functional community activities     Baseline  740 01/28/2017: 777f 6/5: 795  730 on 07/19/17; 790 ft on 08/23/18,  740 feet, 730 feet 12/20/17, 12/27/17  650 feet 6/20: 665 8/8: 701    Time  12    Period  Weeks    Status  On-going    Target Date  09/14/18      PT LONG TERM GOAL #6   Title  Patient will improve gait speed to  > 1.2 m/s with least restrictive assistive device to return to normal walking speed     Baseline  .76 m/h 01/28/2017: .87  07/19/17 0.71ms; 0.77 m/s on 08/23/17, . 58 m/sec 10/11/17 .653mec 6/20: 77 m/s  8/8: .778m    Time  12    Period  Weeks    Status  On-going   03/21/18 target dat     PT LONG TERM GOAL #7   Title  Patient (< 60 25ars old) will complete five times sit to stand test in < 10 seconds indicating an increased LE strength and improved balance     Baseline  9.45 sec; 9.63 on 08/23/17, 9.63 sec 12/27/17, 10.07 sec    Time  12    Period  Weeks    Status  Achieved   03/26/18 target date     PT [place]   Title  Patient will be able to ambulate on inclines and grass independenlty with LRAD    Baseline  Can ambulate over grassy inclines with decreased speed and safety requiring need loftstrand curtches and SBA; 5/15: pt reports she has to walk slowly and feels unsteady but that this is improving6/20 : reports cautious and slow walk, able to do mod I    Time  12    Period  Weeks    Status  On-going   03/21/18 target date     PT LONG TERM GOAL  #9   TITLE  Patient will be abe to transfer from low chair or stool without UE support independently    Baseline  able to perform independently when not tired, but when fatigued requires support.     Time  12    Period  Weeks    Status  Partially Met   03/21/18 target date     PT LONG TERM GOAL  #10   TITLE  Patient will be able to transfer from the floor to standing independently with use of LRAD    Baseline  difficult on different types of floor. Slippery floors more challenging and how long on the floor. Only need assistance when not able to put crutch against surface.     Time  12    Period  Weeks    Status  On-going   03/21/18 target date     PT LONG TERM GOAL  #11   TITLE  Patient will step up onto a 6" step 5x with AD independently to increase community mobility    Baseline  6/20: challenging to patient ascending steps,  supervision ; 8/8: difficult clearing L foot     Time  12    Period  Weeks    Status  On-going   01/03/18 target date           Plan - 06/22/18 1533    Clinical Impression Statement  Patient demonstrated improved 6 min walk test and BERG balance . Continued progression of functional ambulation and stability is allowing patient more independence in scholastic and home setting. Continued focus on curb negotiation, ambulatory capacity, and ambulation on unstable surfaces will benefit patient's mobility in natural environment and increase safety.  Patient is progressing in LE strength  and mobility with increased ability to disassociate LE for improved reciprocal motion. Patient improving with ambulatory capacity over stable and unstable surfaces.   Patient will continue to benefit from skilled physical therapy in order to maximize functional mobility and safety    Rehab Potential  Good    Clinical Impairments Affecting Rehab Potential  weakness and decreased standing balance    PT Frequency  1x / week    PT Duration  12 weeks    PT Treatment/Interventions  Therapeutic exercise;Therapeutic activities;Gait training;Balance training;Stair training;DME Instruction;Neuromuscular re-education;Patient/family education    PT Next Visit Plan  Continue POC    PT Home Exercise Plan  added seated dynamic core UE and LE lift offs with visual cues using mirror, supine marches       Patient will benefit from skilled therapeutic intervention in order to improve the following deficits and impairments:  Abnormal gait, Decreased balance, Decreased endurance, Difficulty walking, Decreased strength  Visit Diagnosis: Muscle weakness (generalized)  Other lack of coordination  Difficulty in walking, not elsewhere classified     Problem List There are no active problems to display for this patient.  Janna Arch, PT, DPT   06/22/2018, 4:28 PM  [place] MAIN [facility]  SERVICES [address] [place], [place], [address] Phone: [phone]   Fax:  [phone]  Name: TAWNYA PUJOL MRN: [id number] Date of Birth: 1996/03/23

## 2018-06-22 NOTE — Therapy (Addendum)
[place] MAIN [facility] SERVICES [address] [place], [place], [address] Phone: [phone]   Fax:  [phone]  Occupational Therapy Treatment  Patient Details  Name: Michelle Randall MRN: [id number] Date of Birth: 06/21/1996 Referring Provider: Langley Gauss MD   Encounter Date: 06/22/2018  OT End of Session - 06/22/18 1545    Visit Number  [id number]    Number of Visits  142    Date for OT Re-Evaluation  08/10/18    Authorization Type  medicaid visit 46 of 118    OT Start Time  6945    OT Stop Time  1615    OT Time Calculation (min)  45 min    Activity Tolerance  Patient tolerated treatment well    Behavior During Therapy  [facility] for tasks assessed/performed       History reviewed. No pertinent past medical history.  History reviewed. No pertinent surgical history.  There were no vitals filed for this visit.  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  Going to a [employer] concert with a friend on the 14th    Patient Stated Goals  To be as independent as possible at home and at school.     Currently in Pain?  No/denies    Pain Score  0-No pain          Patient seen for BUE strengthening exercise from seated position with 2# wrist weights bilaterally, to reach and move objects from  One level of shape tower to the next, 4 levels completed with each UE, cues to take rest breaks when quality of movement decreases.  Patient seen for manipulation of pieces from [employer], with small items placed in magnetic bowl and she had to use increased Finger strength to obtain items prior to assembling them on the board. Focused on translatory movements of the hand and using the hand for storage.  Resistive putty for obtaining small items from putty and placing into grid.   Fine motor coordination skills  to pick up small toothpicks and place into container in small holed openings.                   OT Education - 06/22/18 1545    Education provided  Yes    Education Details  2# weights for shoulder exercises    Person(s) Educated  Patient    Methods  Explanation;Demonstration    Comprehension  Verbalized understanding;Returned demonstration;Tactile cues required          OT Long Term Goals - 05/25/18 1606      OT LONG TERM GOAL #1   Title  Patient will demonstrate increased strength in right hand sufficient to scoop ice cream from frozen container with modified independence.    Baseline  update 05/24/2018:  pt has not had ice cream in a while, she will try soon.    Time  12    Period  Weeks    Status  On-going      OT LONG TERM GOAL #2   Title  Patient will be able to make her bed with modified independence.    Baseline  update 05/24/2018:  still takes her an extended period of time, and occasionally requires help with heavier comforter    Time  6    Period  Weeks  Status  On-going      OT LONG TERM GOAL #3   Title  Patient will complete back closure bra donning with modified independence.     Baseline  max assist at eval    Time  12    Period  Weeks    Status  Achieved      OT LONG TERM GOAL #4   Title  Patient will increase BUE strength by 1 mm grade to be able to self propel her wheelchair over carpet for distances greater than 300 feet    Baseline  update 05/24/2018:  she is able to go longer distances up to 200 feet but has difficulty with rocky, uneven areas.     Time  12    Period  Weeks    Status  On-going      OT LONG TERM GOAL #5   Title  Patient will demonstrate the ability to perform small buttons on shirts independently and with good speed in order to dress for school.    Baseline  goal update: Patient can perform with increased time but occasionally needs assist if rushed, still difficult at goal update 11/23/2017    Time  12    Period  Weeks     Status  Achieved      OT LONG TERM GOAL #6   Title  Patient will donn and doff small earrings with modified independence.    Baseline  update 05/24/2018:  small earrings are still difficult and she has not worn in a while     Time  12    Period  Weeks    Status  Partially Met      OT LONG TERM GOAL #7   Title  Patient will demonstrate cutting vegetables with modified independence with use of adapted equipment and modified techniques    Baseline  unable to cut hard vegetables such as carrots 05/24/18    Time  8    Period  Weeks    Status  New      OT LONG TERM GOAL #8   Title  Patient to complete am self care demonstrating efficient methods of dressing skills to get to school on time.     Baseline  Goal update:  patient reports it takes about an hour to get dressed, running late one time this week.  Still working on efficiency and speed of tasks    Time  12    Period  Weeks    Status  Achieved      OT LONG TERM GOAL  #9   Baseline  Patient will don/doff socks and AFO braces with modified independence using adaptive equipment as needed.  Socks and braces are more difficult after showering and if socks are new, otherwise she can do it.     Time  12    Period  Weeks    Status  Achieved      OT LONG TERM GOAL  #10   TITLE  Patient will demonstrate the ability to pull jeans down over AFOs with modified independence.     Baseline  update 05/24/2018:  more difficulty if she wears skinny jeans.    Time  12    Period  Weeks    Status  On-going      OT LONG TERM GOAL  #11   TITLE  Patient will demonstrate moving objects in the kitchen from one surface to another with modified independence and good safety techniques.  Baseline  unable to move larger bowls from one place to another.  05/24/2018    Time  12    Period  Weeks    Status  New      OT LONG TERM GOAL  #12   TITLE  Patient will demonstrate increased grip strength to be able to wring out kitchen cloth sufficiently so water  does not drip onto the floor and cause a slip hazard.     Baseline  05/24/18:has difficulty getting all the water out of cloth 05/24/2018    Time  8    Period  Weeks    Status  New      OT LONG TERM GOAL  #13   TITLE  Patient will demonstrate ability to use whisk to scramble eggs independently.    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     Baseline  Continued difficulty coordinating the use of both hands to complete task, speed still an issue 11/23/17    Status  Achieved      OT LONG TERM GOAL  #15   TITLE  Patient will complete meal preparation with minimal assist.     Baseline  update:  continues to require moderate assist with meal prep    Time  12    Period  Weeks    Status  On-going            Plan - 06/22/18 1546    Clinical Impression Statement  Patient was  inconsistent over the summer with her therapy appointments due to summer classes at [employer] and doing an internship.  She is now done with Summer classes and on a break until, July 03, 2018.  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  She continues to report difficulties with speed of getting am self care completed, making her bed and strength to propel wheelchair. Cues during fine motor tasks for prehension patterns.  Continue to work towards skills to improve independence in daily tasks.    Occupational Profile and client history currently impacting functional performance  decreased hand strength, balance and coordination.    Occupational performance deficits (Please refer to evaluation for details):  ADL's;IADL's;Leisure;Education    Rehab Potential  Good    OT Frequency   1x / week    OT Duration  12 weeks    OT Treatment/Interventions  Self-care/ADL training;DME and/or AE instruction;Therapeutic activities;Therapeutic exercise;Neuromuscular education;Patient/family education    Consulted and Agree with Plan of Care  Patient       Patient will benefit from skilled therapeutic intervention in order to improve the following deficits and impairments:  Impaired UE functional use, Difficulty walking, Decreased strength, Decreased coordination, Decreased endurance, Decreased balance  Visit Diagnosis: Muscle weakness (generalized)  Other lack of coordination    Problem List There are no active problems to display for this patient.  Achilles Dunk, OTR/L, CLT  Michelle Randall 06/22/2018, 4:33 PM  [place] MAIN [facility] SERVICES [address] [place], [place], [address] Phone:  [phone]   Fax:  [phone]  Name: Michelle Randall MRN: [id number] Date of Birth: 1996-01-25

## 2018-06-28 ENCOUNTER — Ambulatory Visit: Payer: Medicaid Other | Admitting: Occupational Therapy

## 2018-06-28 ENCOUNTER — Encounter: Payer: Self-pay | Admitting: Occupational Therapy

## 2018-06-28 ENCOUNTER — Ambulatory Visit: Payer: Medicaid Other

## 2018-06-28 DIAGNOSIS — R279 Unspecified lack of coordination: Secondary | ICD-10-CM

## 2018-06-28 DIAGNOSIS — R278 Other lack of coordination: Secondary | ICD-10-CM

## 2018-06-28 DIAGNOSIS — R262 Difficulty in walking, not elsewhere classified: Secondary | ICD-10-CM

## 2018-06-28 DIAGNOSIS — M6281 Muscle weakness (generalized): Secondary | ICD-10-CM

## 2018-06-28 NOTE — Therapy (Signed)
[place] MAIN [facility] SERVICES [address] [place], [place], [address] Phone: [phone]   Fax:  [phone]  Physical Therapy Treatment  Patient Details  Name: Michelle Randall MRN: [id number] Date of Birth: Jan 06, 1996 Referring Provider: Langley Gauss MD   Encounter Date: 06/28/2018  PT End of Session - 06/28/18 1440    Visit Number  135    Number of Visits  147    Date for PT Re-Evaluation  09/14/18    Authorization Type  6/12    Authorization Time Period  12 visits 5/29-8/20    PT Start Time  6010    PT Stop Time  1432    PT Time Calculation (min)  45 min    Equipment Utilized During Treatment  Gait belt    Activity Tolerance  Patient tolerated treatment well    Behavior During Therapy  [facility] for tasks assessed/performed       History reviewed. No pertinent past medical history.  History reviewed. No pertinent surgical history.  There were no vitals filed for this visit.  Subjective Assessment - 06/28/18 1439    Subjective  Patient presents to therapy excited for attending concert tonight. Reports compliance with HEP.     Patient is accompained by:  Family member    Limitations  Walking    Patient Stated Goals  Patient wants to improve her core strength.     Currently in Pain?  No/denies       ambulate across stable and unstable surface outside. Negotiating changing surfaces from grass to sidewalk, across brick with turns and obstacles in pathway without LOB. 3 seated rest breaks for >850 ft  Negotiate curb: bilateral crutches: descend: place both crutches on street step with weaker leg than stronger leg. Ascend: place both crutches on sidewalk. Bring strong knee up first then cues for lifting and flexing left knee to follow right. Successfully performed 5x.   supine LE horizontal rotations for low back musculature relaxation 2 minutes Hamstring stretch, hamstring stretch with popliteal angle 60 seconds each position each  leg.  TrA activation with marches 10x each leg 2lb dumbbell ipsilateral arm to knee crunch/reverse crunch 7x, contralateral arm to knee crunch/reverse crunch 7x; each arm.                            PT Education - 06/28/18 1439    Education provided  Yes    Education Details  exercise technique, curb negotiation     Person(s) Educated  Patient    Methods  Explanation;Demonstration;Verbal cues    Comprehension  Verbalized understanding;Returned demonstration       PT Short Term Goals - 06/22/18 1534      PT SHORT TERM GOAL #1   Title  Patient will report compliance with HEP for continued strengthening and stability during functional mobility.     Baseline  HEP compliant    Time  2    Period  Weeks    Status  New        PT Long Term Goals - 06/28/18 1441      PT LONG TERM GOAL #1   Title  Patient will improve Dynamic Gait Index (DGI) score to > 21/24 for meaningful improvement and low falls risk regarding dynamic walking tasks (revised from > 19/24 )    Baseline  14/24; 11/02/16: 15/24 01/28/17: 18/24 6/5: 19/24; 08/23/17 14/24,  14/24 10/11/17 14/24 12/20/17, 12/27/17/  14/24  Time  12    Period  Weeks    Status  On-going      PT LONG TERM GOAL #2   Title  Patient will increase Berg Balance score by >51/56 points to be considered a low risk for falls for improved safety. (revised from >6 points improvement)    Baseline  11/02/16: 34/56 01/28/17: 39/56 6/5: 45/56; 08/23/2017 = 45/56, 43/56 10/11/17, 43/56 12/20/17, 12/27/17  43/56 6/20: 38/56 no UE support ; 8/8: 40/56 no UE support    Time  12    Period  Weeks    Status  On-going      PT LONG TERM GOAL #3   Title  Patient will be able to transfer in and out of a large car with  a high seat with CGA  to improve ability to go to school/doctor visits.     Baseline  Patient requires min A for transfer into large SUV unless it has a step rail on the side; 5/15: pt requires assist with a really high car but can  get out of an SUV without assist, typically needs more assits getting in and getting in on the L side; 6/20: if ledge able to do with little to no assistance.     Time  12    Period  Weeks    Status  Partially Met      PT LONG TERM GOAL #4   Title  Patient will complete a TUG test in < 12 seconds for independent mobility and decreased fall risk     Baseline  11.45; 08/23/2017 = 13.66 sec, 13.40 12/20/17, 12/27/17 12.07 sec; 5/15: 13.42 seconds 6/20: 12 seconds    Time  12    Period  Weeks    Status  Achieved      PT LONG TERM GOAL #5   Title  Patient will improve 6 minute walk distance by > 150 ft for improved return to functional community activities     Baseline  740 01/28/2017: 754f 6/5: 795  730 on 07/19/17; 790 ft on 08/23/18,  740 feet, 730 feet 12/20/17, 12/27/17 650 feet 6/20: 665 8/8: 701    Time  12    Period  Weeks    Status  On-going      PT LONG TERM GOAL #6   Title  Patient will improve gait speed to > 1.2 m/s with least restrictive assistive device to return to normal walking speed     Baseline  .76 m/h 01/28/2017: .87  07/19/17 0.670m; 0.77 m/s on 08/23/17, . 58 m/sec 10/11/17 .6648mc 6/20: 77 m/s  8/8: .44m48m   Time  12    Period  Weeks    Status  On-going   03/21/18 target dat     PT LONG TERM GOAL #7   Title  Patient (< 60 y65rs old) will complete five times sit to stand test in < 10 seconds indicating an increased LE strength and improved balance     Baseline  9.45 sec; 9.63 on 08/23/17, 9.63 sec 12/27/17, 10.07 sec    Time  12    Period  Weeks    Status  Achieved   03/26/18 target date     [place]  Title  Patient will be able to ambulate on inclines and grass independenlty with LRAD    Baseline  Can ambulate over grassy inclines with decreased speed and safety requiring need loftstrand curtches and SBA; 5/15: pt reports  she has to walk slowly and feels unsteady but that this is improving6/20 : reports cautious and slow walk, able to do mod I    Time  12     Period  Weeks    Status  On-going   03/21/18 target date     PT LONG TERM GOAL  #9   TITLE  Patient will be abe to transfer from low chair or stool without UE support independently    Baseline  able to perform independently when not tired, but when fatigued requires support.     Time  12    Period  Weeks    Status  Partially Met   03/21/18 target date     PT LONG TERM GOAL  #10   TITLE  Patient will be able to transfer from the floor to standing independently with use of LRAD    Baseline  difficult on different types of floor. Slippery floors more challenging and how long on the floor. Only need assistance when not able to put crutch against surface.     Time  12    Period  Weeks    Status  On-going   03/21/18 target date     PT LONG TERM GOAL  #11   TITLE  Patient will step up onto a 6" step 5x with AD independently to increase community mobility    Baseline  6/20: challenging to patient ascending steps, supervision ; 8/8: difficult clearing L foot /14: can step up curb independently     Time  12    Period  Weeks    Status  Achieved   01/03/18 target date           Plan - 06/28/18 1441    Clinical Impression Statement  Patient demonstrated carryover from step training in previous session with increased clearance of L foot with curb negotiation. Patient verbalized and demonstrated competency of curb negotiation. Patient continues to progress with functional ambulatory capacity with occasional rest breaks required with use of bilateral crutches. Patient will continue to benefit from skilled physical therapy in order to maximize functional mobility and safety    Rehab Potential  Good    Clinical Impairments Affecting Rehab Potential  weakness and decreased standing balance    PT Frequency  1x / week    PT Duration  12 weeks    PT Treatment/Interventions  Therapeutic exercise;Therapeutic activities;Gait training;Balance training;Stair training;DME Instruction;Neuromuscular  re-education;Patient/family education;Manual techniques    PT Next Visit Plan  Continue POC    PT Home Exercise Plan  added seated dynamic core UE and LE lift offs with visual cues using mirror, supine marches       Patient will benefit from skilled therapeutic intervention in order to improve the following deficits and impairments:  Abnormal gait, Decreased balance, Decreased endurance, Difficulty walking, Decreased strength  Visit Diagnosis: Muscle weakness (generalized)  Other lack of coordination  Difficulty in walking, not elsewhere classified  Lack of coordination     Problem List There are no active problems to display for this patient.  Janna Arch, PT, DPT   06/28/2018, 2:43 PM  [place] MAIN [facility] SERVICES [address] [place], [place], [address] Phone: [phone]   Fax:  [phone]  Name: Michelle Randall MRN: [id number] Date of Birth: 1996-04-10

## 2018-06-28 NOTE — Therapy (Signed)
[place] MAIN [facility] SERVICES [address] [place], [place], [address] Phone: [phone]   Fax:  [phone]  Occupational Therapy Treatment  Patient Details  Name: Michelle Randall MRN: [id number] Date of Birth: [birth year] Referring Provider: Langley Gauss MD   Encounter Date: 06/28/2018  OT End of Session - 06/28/18 1351    Visit Number  124    Number of Visits  142    Date for OT Re-Evaluation  08/10/18    OT Start Time  0762    OT Stop Time  1342    OT Time Calculation (min)  44 min    Activity Tolerance  Patient tolerated treatment well    Behavior During Therapy  [facility] for tasks assessed/performed       History reviewed. No pertinent past medical history.  History reviewed. No pertinent surgical history.  There were no vitals filed for this visit.  Subjective Assessment - 06/28/18 1350    Subjective   Patient reports she will be getting her new [employer] in the next couple days and is excited.  She has been practicing driving some in the last couple weeks in the car with modifications.  She is planning to go to a concert tonight with a friend in [place].    Patient Stated Goals  To be as independent as possible at home and at school.     Currently in Pain?  No/denies    Pain Score  0-No pain         Patient seen this date for BUE strengthening with red resistive theraband for shoulder flexion, ABD, diagonal patterns, Elbow flexion, extension, shoulder extension for 2 sets of 15 repetitions each.  Cues for form and technique.   2# wrist weights to each arm and performing reaching tasks with SAEBO ball tower, all 4 levels of reach in sitting, cues for weight shifting and  Reach to highest level.  2 complete trials performed without any rest breaks.   Manipulation of a variety of small earring backs to pick up, using translatory movements of the hand and using the hand for storage for the larger of the sized  Objects.     Manipulation of cards for shuffling with use of bilateral hands, thumb finger combination movements for turning and flipping cards with cues for  Speed and dexterity.                   OT Education - 06/28/18 1351    Education provided  Yes    Education Details  resistive exercises with theraband    Person(s) Educated  Patient    Methods  Explanation;Demonstration    Comprehension  Verbalized understanding;Returned demonstration;Tactile cues required          OT Long Term Goals - 05/25/18 1606      OT LONG TERM GOAL #1   Title  Patient will demonstrate increased strength in right hand sufficient to scoop ice cream from frozen container with modified independence.    Baseline  update 05/24/2018:  pt has not had ice cream in a while, she will try soon.    Time  12    Period  Weeks    Status  On-going      OT LONG TERM GOAL #2   Title  Patient will be able to make her bed with modified independence.    Baseline  update 05/24/2018:  still takes her an extended period of time, and occasionally requires help  with heavier comforter    Time  6    Period  Weeks    Status  On-going      OT LONG TERM GOAL #3   Title  Patient will complete back closure bra donning with modified independence.     Baseline  max assist at eval    Time  12    Period  Weeks    Status  Achieved      OT LONG TERM GOAL #4   Title  Patient will increase BUE strength by 1 mm grade to be able to self propel her wheelchair over carpet for distances greater than 300 feet    Baseline  update 05/24/2018:  she is able to go longer distances up to 200 feet but has difficulty with rocky, uneven areas.     Time  12    Period  Weeks    Status  On-going      OT LONG TERM GOAL #5   Title  Patient will demonstrate the ability to perform small buttons on shirts independently and with good speed in order to dress for school.    Baseline  goal update: Patient can perform with increased time but occasionally  needs assist if rushed, still difficult at goal update 11/23/2017    Time  12    Period  Weeks    Status  Achieved      OT LONG TERM GOAL #6   Title  Patient will donn and doff small earrings with modified independence.    Baseline  update 05/24/2018:  small earrings are still difficult and she has not worn in a while     Time  12    Period  Weeks    Status  Partially Met      OT LONG TERM GOAL #7   Title  Patient will demonstrate cutting vegetables with modified independence with use of adapted equipment and modified techniques    Baseline  unable to cut hard vegetables such as carrots 05/24/18    Time  8    Period  Weeks    Status  New      OT LONG TERM GOAL #8   Title  Patient to complete am self care demonstrating efficient methods of dressing skills to get to school on time.     Baseline  Goal update:  patient reports it takes about an hour to get dressed, running late one time this week.  Still working on efficiency and speed of tasks    Time  12    Period  Weeks    Status  Achieved      OT LONG TERM GOAL  #9   Baseline  Patient will don/doff socks and AFO braces with modified independence using adaptive equipment as needed.  Socks and braces are more difficult after showering and if socks are new, otherwise she can do it.     Time  12    Period  Weeks    Status  Achieved      OT LONG TERM GOAL  #10   TITLE  Patient will demonstrate the ability to pull jeans down over AFOs with modified independence.     Baseline  update 05/24/2018:  more difficulty if she wears skinny jeans.    Time  12    Period  Weeks    Status  On-going      OT LONG TERM GOAL  #11   TITLE  Patient will demonstrate moving objects in  the kitchen from one surface to another with modified independence and good safety techniques.     Baseline  unable to move larger bowls from one place to another.  05/24/2018    Time  12    Period  Weeks    Status  New      OT LONG TERM GOAL  #12   TITLE  Patient will  demonstrate increased grip strength to be able to wring out kitchen cloth sufficiently so water does not drip onto the floor and cause a slip hazard.     Baseline  05/24/18:has difficulty getting all the water out of cloth 05/24/2018    Time  8    Period  Weeks    Status  New      OT LONG TERM GOAL  #13   TITLE  Patient will demonstrate ability to use whisk to scramble eggs independently.    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     Baseline  Continued difficulty coordinating the use of both hands to complete task, speed still an issue 11/23/17    Status  Achieved      OT LONG TERM GOAL  #15   TITLE  Patient will complete meal preparation with minimal assist.     Baseline  update:  continues to require moderate assist with meal prep    Time  12    Period  Weeks    Status  On-going            Plan - 06/28/18 1352    Clinical Impression Statement  Patient requesting to work on strengthening this date since she will be going to back to school soon, will be driving herself in her new handicapped equipped [employer] Lei and will need to manage her bookbag and other items, getting herself in and out of the [employer] and managing the wheelchair lift. She was able to perform exercise with reaching with 2# weights to each wrist and did not have to take a break as she has in the past, was able to complete 2 full trials on each arm without rest breaks. She continues to have difficulty with managing small objects less than 1/2 inch in size, focused this date on picking up small earring backs of various sizes.  Cues at times for prehension patterns.     Occupational Profile and client history currently impacting functional performance  decreased hand strength, balance and  coordination.    Occupational performance deficits (Please refer to evaluation for details):  ADL's;IADL's;Leisure;Education    Rehab Potential  Good    OT Frequency  1x / week    OT Duration  12 weeks    OT Treatment/Interventions  Self-care/ADL training;DME and/or AE instruction;Therapeutic activities;Therapeutic exercise;Neuromuscular education;Patient/family education    Plan  Occupational Therapy 1 x per week for ADL and strenghtening.    Consulted and Agree with Plan of Care  Patient       Patient will benefit from skilled therapeutic intervention in order to improve the following deficits and impairments:  Impaired UE functional use, Difficulty walking, Decreased strength, Decreased coordination, Decreased endurance, Decreased balance  Visit Diagnosis: Muscle weakness (  generalized)  Other lack of coordination    Problem List There are no active problems to display for this patient.  Achilles Dunk, OTR/L, CLT  Lovett,Amy 06/28/2018, 1:56 PM  [place] MAIN [facility] SERVICES [address] [place], [place], [address] Phone: [phone]   Fax:  [phone]  Name: KARA MELCHING MRN: [id number] Date of Birth: 20-Jun-1996

## 2018-07-05 ENCOUNTER — Ambulatory Visit: Payer: Medicaid Other

## 2018-07-06 ENCOUNTER — Ambulatory Visit: Payer: Medicaid Other | Admitting: Occupational Therapy

## 2018-07-06 ENCOUNTER — Encounter: Payer: Self-pay | Admitting: Occupational Therapy

## 2018-07-06 DIAGNOSIS — M6281 Muscle weakness (generalized): Secondary | ICD-10-CM | POA: Diagnosis not present

## 2018-07-06 DIAGNOSIS — R278 Other lack of coordination: Secondary | ICD-10-CM

## 2018-07-06 NOTE — Therapy (Signed)
[place] MAIN [facility] SERVICES [address] [place], [place], [address] Phone: [phone]   Fax:  [phone]  Occupational Therapy Treatment  Patient Details  Name: Michelle Randall MRN: [id number] Date of Birth: 24-Aug-1996 Referring Provider: Langley Gauss MD   Encounter Date: 07/06/2018  OT End of Session - 07/06/18 1313    Visit Number  125    Number of Visits  142    Date for OT Re-Evaluation  08/10/18    Authorization Type  medicaid visit 17 of 118    OT Start Time  3299    OT Stop Time  1345    OT Time Calculation (min)  38 min    Equipment Utilized During Treatment  lemon press    Activity Tolerance  Patient tolerated treatment well    Behavior During Therapy  [facility] for tasks assessed/performed       History reviewed. No pertinent past medical history.  History reviewed. No pertinent surgical history.  There were no vitals filed for this visit.                        OT Education - 07/06/18 1313    Education provided  Yes    Education Details  resistive exercises with theraband    Person(s) Educated  Patient    Methods  Explanation;Demonstration    Comprehension  Verbalized understanding;Returned demonstration;Tactile cues required          OT Long Term Goals - 05/25/18 1606      OT LONG TERM GOAL #1   Title  Patient will demonstrate increased strength in right hand sufficient to scoop ice cream from frozen container with modified independence.    Baseline  update 05/24/2018:  pt has not had ice cream in a while, she will try soon.    Time  12    Period  Weeks    Status  On-going      OT LONG TERM GOAL #2   Title  Patient will be able to make her bed with modified independence.    Baseline  update 05/24/2018:  still takes her an extended period of time, and occasionally requires help with heavier comforter    Time  6    Period  Weeks    Status  On-going      OT LONG TERM GOAL #3   Title  Patient will complete back closure bra donning with modified independence.     Baseline  max assist at eval    Time  12    Period  Weeks    Status  Achieved      OT LONG TERM GOAL #4   Title  Patient will increase BUE strength by 1 mm grade to be able to self propel her wheelchair over carpet for distances greater than 300 feet    Baseline  update 05/24/2018:  she is able to go longer distances up to 200 feet but has difficulty with rocky, uneven areas.     Time  12    Period  Weeks    Status  On-going      OT LONG TERM GOAL #5   Title  Patient will demonstrate the ability to perform small buttons on shirts independently and with good speed in order to dress for school.    Baseline  goal update: Patient can perform with increased time but occasionally needs assist if rushed, still difficult at goal update 11/23/2017  Time  12    Period  Weeks    Status  Achieved      OT LONG TERM GOAL #6   Title  Patient will donn and doff small earrings with modified independence.    Baseline  update 05/24/2018:  small earrings are still difficult and she has not worn in a while     Time  12    Period  Weeks    Status  Partially Met      OT LONG TERM GOAL #7   Title  Patient will demonstrate cutting vegetables with modified independence with use of adapted equipment and modified techniques    Baseline  unable to cut hard vegetables such as carrots 05/24/18    Time  8    Period  Weeks    Status  New      OT LONG TERM GOAL #8   Title  Patient to complete am self care demonstrating efficient methods of dressing skills to get to school on time.     Baseline  Goal update:  patient reports it takes about an hour to get dressed, running late one time this week.  Still working on efficiency and speed of tasks    Time  12    Period  Weeks    Status  Achieved      OT LONG TERM GOAL  #9   Baseline  Patient will don/doff socks and AFO braces with modified independence using adaptive equipment as  needed.  Socks and braces are more difficult after showering and if socks are new, otherwise she can do it.     Time  12    Period  Weeks    Status  Achieved      OT LONG TERM GOAL  #10   TITLE  Patient will demonstrate the ability to pull jeans down over AFOs with modified independence.     Baseline  update 05/24/2018:  more difficulty if she wears skinny jeans.    Time  12    Period  Weeks    Status  On-going      OT LONG TERM GOAL  #11   TITLE  Patient will demonstrate moving objects in the kitchen from one surface to another with modified independence and good safety techniques.     Baseline  unable to move larger bowls from one place to another.  05/24/2018    Time  12    Period  Weeks    Status  New      OT LONG TERM GOAL  #12   TITLE  Patient will demonstrate increased grip strength to be able to wring out kitchen cloth sufficiently so water does not drip onto the floor and cause a slip hazard.     Baseline  05/24/18:has difficulty getting all the water out of cloth 05/24/2018    Time  8    Period  Weeks    Status  New      OT LONG TERM GOAL  #13   TITLE  Patient will demonstrate ability to use whisk to scramble eggs independently.    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     Baseline  Continued difficulty coordinating the use of both hands to complete task, speed still an issue 11/23/17    Status  Achieved      OT LONG TERM GOAL  #15   TITLE  Patient will complete meal preparation with minimal assist.     Baseline  update:  continues to require moderate assist with meal prep    Time  12    Period  Weeks    Status  On-going            Plan - 07/06/18 1313    Clinical Impression Statement Pt. reports that she is doing dual  enrollment, and has started her classes at [employer]. Pt.'s classes start at [employer] in a few weeks. Pt. continues to work on improving bilateral [facility] skills, and UE hand function skills for improved ADL, and IADL tasks.     Occupational Profile and client history currently impacting functional performance  decreased hand strength, balance and coordination.    Occupational performance deficits (Please refer to evaluation for details):  ADL's;IADL's;Leisure;Education    Rehab Potential  Good    OT Frequency  1x / week    OT Duration  12 weeks    OT Treatment/Interventions  Self-care/ADL training;DME and/or AE instruction;Therapeutic activities;Therapeutic exercise;Neuromuscular education;Patient/family education    Plan  Occupational Therapy 1 x per week for ADL and strenghtening.    OT Home Exercise Plan  try elastic shoe laces and new tech for bra donning and doffing    Consulted and Agree with Plan of Care  Patient    Family Member Consulted  mom       Patient will benefit from skilled therapeutic intervention in order to improve the following deficits and impairments:  Impaired UE functional use, Difficulty walking, Decreased strength, Decreased coordination, Decreased endurance, Decreased balance  Visit Diagnosis: Muscle weakness (generalized)  Other lack of coordination    Problem List There are no active problems to display for this patient.   Harrel Carina, MS, OTR/L 07/06/2018, 1:33 PM  [place] MAIN [facility] SERVICES [address] [place], [place], [address] Phone: [phone]   Fax:  [phone]  Name: KELSEA MOUSEL MRN: [id number] Date of Birth: 1996/04/14

## 2018-07-06 NOTE — Therapy (Signed)
[place] MAIN [facility] SERVICES [address] [place], [place], [address] Phone: [phone]   Fax:  [phone]  Occupational Therapy Treatment  Patient Details  Name: Michelle Randall MRN: [id number] Date of Birth: 1996/03/20 Referring Provider: Langley Gauss MD   Encounter Date: 07/06/2018  OT End of Session - 07/06/18 1313    Visit Number  125    Number of Visits  142    Date for OT Re-Evaluation  08/10/18    Authorization Type  medicaid visit 79 of 118    OT Start Time  1771    OT Stop Time  1345    OT Time Calculation (min)  38 min    Equipment Utilized During Treatment  lemon press    Activity Tolerance  Patient tolerated treatment well    Behavior During Therapy  [facility] for tasks assessed/performed       OT TREATMENT    Neuro muscular re-education:  Pt. worked on Multimedia programmer pegs. Pt. worked on placing them on a vertical whiteboard positioned in elevation to encourage sustained shoulder flexion. Pt. worked on removing the pegs alternating thumb opposition to the 4th, and 5th digits. Pt. worked on bilateral [facility] skills needed to grasp small resistive beads. Pt. worked on connecting the beads using a 3pt. pinch, and pt. Pinch grasp. Pt. worked on disconnecting the resistive beads using a lateral pinch grasp, and 3pt. pinch grasp.  Pt. worked on [facility] skills grasping 1/4" pegs placed at the tabletop surface. Pt. worked on using her right hand for storage, and translatory movements moving the objects from her palm tot he tip of her fingers to prepare for placing them in a pegboard. Pt. Worked on removing the pegs while alternating thumb opposition to the tip of her 2nd through 5th digits.                         OT Education - 07/06/18 1313    Education provided  Yes    Education Details  resistive exercises with theraband    Person(s) Educated  Patient    Methods  Explanation;Demonstration     Comprehension  Verbalized understanding;Returned demonstration;Tactile cues required          OT Long Term Goals - 05/25/18 1606      OT LONG TERM GOAL #1   Title  Patient will demonstrate increased strength in right hand sufficient to scoop ice cream from frozen container with modified independence.    Baseline  update 05/24/2018:  pt has not had ice cream in a while, she will try soon.    Time  12    Period  Weeks    Status  On-going      OT LONG TERM GOAL #2   Title  Patient will be able to make her bed with modified independence.    Baseline  update 05/24/2018:  still takes her an extended period of time, and occasionally requires help with heavier comforter    Time  6    Period  Weeks    Status  On-going      OT LONG TERM GOAL #3   Title  Patient will complete back closure bra donning with modified independence.     Baseline  max assist at eval    Time  12    Period  Weeks    Status  Achieved      OT LONG TERM GOAL #4  Title  Patient will increase BUE strength by 1 mm grade to be able to self propel her wheelchair over carpet for distances greater than 300 feet    Baseline  update 05/24/2018:  she is able to go longer distances up to 200 feet but has difficulty with rocky, uneven areas.     Time  12    Period  Weeks    Status  On-going      OT LONG TERM GOAL #5   Title  Patient will demonstrate the ability to perform small buttons on shirts independently and with good speed in order to dress for school.    Baseline  goal update: Patient can perform with increased time but occasionally needs assist if rushed, still difficult at goal update 11/23/2017    Time  12    Period  Weeks    Status  Achieved      OT LONG TERM GOAL #6   Title  Patient will donn and doff small earrings with modified independence.    Baseline  update 05/24/2018:  small earrings are still difficult and she has not worn in a while     Time  12    Period  Weeks    Status  Partially Met      OT LONG  TERM GOAL #7   Title  Patient will demonstrate cutting vegetables with modified independence with use of adapted equipment and modified techniques    Baseline  unable to cut hard vegetables such as carrots 05/24/18    Time  8    Period  Weeks    Status  New      OT LONG TERM GOAL #8   Title  Patient to complete am self care demonstrating efficient methods of dressing skills to get to school on time.     Baseline  Goal update:  patient reports it takes about an hour to get dressed, running late one time this week.  Still working on efficiency and speed of tasks    Time  12    Period  Weeks    Status  Achieved      OT LONG TERM GOAL  #9   Baseline  Patient will don/doff socks and AFO braces with modified independence using adaptive equipment as needed.  Socks and braces are more difficult after showering and if socks are new, otherwise she can do it.     Time  12    Period  Weeks    Status  Achieved      OT LONG TERM GOAL  #10   TITLE  Patient will demonstrate the ability to pull jeans down over AFOs with modified independence.     Baseline  update 05/24/2018:  more difficulty if she wears skinny jeans.    Time  12    Period  Weeks    Status  On-going      OT LONG TERM GOAL  #11   TITLE  Patient will demonstrate moving objects in the kitchen from one surface to another with modified independence and good safety techniques.     Baseline  unable to move larger bowls from one place to another.  05/24/2018    Time  12    Period  Weeks    Status  New      OT LONG TERM GOAL  #12   TITLE  Patient will demonstrate increased grip strength to be able to wring out kitchen cloth sufficiently so water does not drip  onto the floor and cause a slip hazard.     Baseline  05/24/18:has difficulty getting all the water out of cloth 05/24/2018    Time  8    Period  Weeks    Status  New      OT LONG TERM GOAL  #13   TITLE  Patient will demonstrate ability to use whisk to scramble eggs independently.     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     Baseline  Continued difficulty coordinating the use of both hands to complete task, speed still an issue 11/23/17    Status  Achieved      OT LONG TERM GOAL  #15   TITLE  Patient will complete meal preparation with minimal assist.     Baseline  update:  continues to require moderate assist with meal prep    Time  12    Period  Weeks    Status  On-going            Plan - 07/06/18 1313    Clinical Impression Statement Pt. reports that she is doing dual enrollment, and has started her classes at [employer]. Pt. reports that she had to learn to find her classes all over again. Pt.'s classes start at [employer] [facility] [employer] in a few weeks. Pt. continues to work on improving bilateral [facility] skills, and UE hand function skills for improved ADL, and IADL tasks.     Occupational Profile and client history currently impacting functional performance  decreased hand strength, balance and coordination.    Occupational performance deficits (Please refer to evaluation for details):  ADL's;IADL's;Leisure;Education    Rehab Potential  Good    OT Frequency  1x / week    OT Duration  12 weeks    OT Treatment/Interventions  Self-care/ADL training;DME and/or AE instruction;Therapeutic activities;Therapeutic exercise;Neuromuscular education;Patient/family education    Plan  Occupational Therapy 1 x per week for ADL and strenghtening.    OT Home Exercise Plan  try elastic shoe laces and new tech for bra donning and doffing    Consulted and Agree with Plan of Care  Patient    Family Member Consulted  mom       Patient will benefit from skilled therapeutic intervention in order to improve the following deficits and impairments:  Impaired UE  functional use, Difficulty walking, Decreased strength, Decreased coordination, Decreased endurance, Decreased balance  Visit Diagnosis: Muscle weakness (generalized)  Other lack of coordination    Problem List There are no active problems to display for this patient.   Harrel Carina, MS, OTR/L 07/06/2018, 1:36 PM  [place] MAIN [facility] SERVICES [address] [place], [place], [address] Phone: [phone]   Fax:  [phone]  Name: Michelle Randall MRN: [id number] Date of Birth: 1996-09-27

## 2018-07-12 ENCOUNTER — Ambulatory Visit: Payer: Medicaid Other | Admitting: Occupational Therapy

## 2018-07-12 ENCOUNTER — Ambulatory Visit: Payer: Medicaid Other

## 2018-07-13 ENCOUNTER — Ambulatory Visit: Payer: Medicaid Other | Admitting: Physical Therapy

## 2018-07-13 ENCOUNTER — Encounter: Payer: Self-pay | Admitting: Physical Therapy

## 2018-07-13 DIAGNOSIS — M6281 Muscle weakness (generalized): Secondary | ICD-10-CM

## 2018-07-13 DIAGNOSIS — Z741 Need for assistance with personal care: Secondary | ICD-10-CM

## 2018-07-13 DIAGNOSIS — R262 Difficulty in walking, not elsewhere classified: Secondary | ICD-10-CM

## 2018-07-13 DIAGNOSIS — R278 Other lack of coordination: Secondary | ICD-10-CM

## 2018-07-13 DIAGNOSIS — R279 Unspecified lack of coordination: Secondary | ICD-10-CM

## 2018-07-13 DIAGNOSIS — Z9181 History of falling: Secondary | ICD-10-CM

## 2018-07-13 DIAGNOSIS — R531 Weakness: Secondary | ICD-10-CM

## 2018-07-13 DIAGNOSIS — R460 Very low level of personal hygiene: Secondary | ICD-10-CM

## 2018-07-13 NOTE — Therapy (Signed)
[place] MAIN [facility] SERVICES [address] [place], [place], [address] Phone: [phone]   Fax:  [phone]  Physical Therapy Treatment  Patient Details  Name: Michelle Randall MRN: [id number] Date of Birth: 04-May-1996 Referring Provider: Langley Gauss MD   Encounter Date: 07/13/2018    History reviewed. No pertinent past medical history.  History reviewed. No pertinent surgical history.  There were no vitals filed for this visit.  Subjective Assessment - 07/13/18 1413    Subjective  Patient is having a tension headache and it is 8/10.  (Pended)     Patient is accompained by:  Family member  (Pended)     Limitations  Walking  (Pended)     Patient Stated Goals  Patient wants to improve her core strength.   (Pended)     Pain Score  8   (Pended)     Pain Location  Head  (Pended)     Pain Descriptors / Indicators  Aching  (Pended)     Pain Type  Acute pain  (Pended)     Pain Onset  In the past 7 days  (Pended)     Aggravating Factors   unknown  (Pended)     Effect of Pain on Daily Activities  none  (Pended)        Manual therapy:  OA release x 5 mins  Manual traction , intermittent x 10 sec on , 5 sec off x 25 mins  Upper trap stretches BUE 30 sec x 3    Review of HEP and importance of stretching B hips in prone  Patient continues to have headache following manual therapy, 6/10.                       PT Short Term Goals - 06/22/18 1534      PT SHORT TERM GOAL #1   Title  Patient will report compliance with HEP for continued strengthening and stability during functional mobility.     Baseline  HEP compliant    Time  2    Period  Weeks    Status  New        PT Long Term Goals - 06/28/18 1441      PT LONG TERM GOAL #1   Title  Patient will improve Dynamic Gait Index (DGI) score to > 21/24 for meaningful improvement and low falls risk regarding dynamic walking tasks (revised from > 19/24 )    Baseline  14/24; 11/02/16: 15/24 01/28/17: 18/24 6/5: 19/24; 08/23/17 14/24,  14/24 10/11/17 14/24 12/20/17, 12/27/17/  14/24    Time  12    Period  Weeks    Status  On-going      PT LONG TERM GOAL #2   Title  Patient will increase Berg Balance score by >51/56 points to be considered a low risk for falls for improved safety. (revised from >6 points improvement)    Baseline  11/02/16: 34/56 01/28/17: 39/56 6/5: 45/56; 08/23/2017 = 45/56, 43/56 10/11/17, 43/56 12/20/17, 12/27/17  43/56 6/20: 38/56 no UE support ; 8/8: 40/56 no UE support    Time  12    Period  Weeks    Status  On-going      PT LONG TERM GOAL #3   Title  Patient will be able to transfer in and out of a large car with  a high seat with CGA  to improve ability to go to school/doctor visits.  Baseline  Patient requires min A for transfer into large SUV unless it has a step rail on the side; 5/15: pt requires assist with a really high car but can get out of an SUV without assist, typically needs more assits getting in and getting in on the L side; 6/20: if ledge able to do with little to no assistance.     Time  12    Period  Weeks    Status  Partially Met      PT LONG TERM GOAL #4   Title  Patient will complete a TUG test in < 12 seconds for independent mobility and decreased fall risk     Baseline  11.45; 08/23/2017 = 13.66 sec, 13.40 12/20/17, 12/27/17 12.07 sec; 5/15: 13.42 seconds 6/20: 12 seconds    Time  12    Period  Weeks    Status  Achieved      PT LONG TERM GOAL #5   Title  Patient will improve 6 minute walk distance by > 150 ft for improved return to functional community activities     Baseline  740 01/28/2017: 775f 6/5: 795  730 on 07/19/17; 790 ft on 08/23/18,  740 feet, 730 feet 12/20/17, 12/27/17 650 feet 6/20: 665 8/8: 701    Time  12    Period  Weeks    Status  On-going      PT LONG TERM GOAL #6   Title  Patient will improve gait speed to > 1.2 m/s with least restrictive assistive device to return to normal walking speed      Baseline  .76 m/h 01/28/2017: .87  07/19/17 0.63m; 0.77 m/s on 08/23/17, . 58 m/sec 10/11/17 .6620mc 6/20: 77 m/s  8/8: .68m12m   Time  12    Period  Weeks    Status  On-going   03/21/18 target dat     PT LONG TERM GOAL #7   Title  Patient (< 60 y4rs old) will complete five times sit to stand test in < 10 seconds indicating an increased LE strength and improved balance     Baseline  9.45 sec; 9.63 on 08/23/17, 9.63 sec 12/27/17, 10.07 sec    Time  12    Period  Weeks    Status  Achieved   03/26/18 target date     PT [place]  Title  Patient will be able to ambulate on inclines and grass independenlty with LRAD    Baseline  Can ambulate over grassy inclines with decreased speed and safety requiring need loftstrand curtches and SBA; 5/15: pt reports she has to walk slowly and feels unsteady but that this is improving6/20 : reports cautious and slow walk, able to do mod I    Time  12    Period  Weeks    Status  On-going   03/21/18 target date     PT LONG TERM GOAL  #9   TITLE  Patient will be abe to transfer from low chair or stool without UE support independently    Baseline  able to perform independently when not tired, but when fatigued requires support.     Time  12    Period  Weeks    Status  Partially Met   03/21/18 target date     PT LONG TERM GOAL  #10   TITLE  Patient will be able to transfer from the floor to standing independently with use of LRAD  Baseline  difficult on different types of floor. Slippery floors more challenging and how long on the floor. Only need assistance when not able to put crutch against surface.     Time  12    Period  Weeks    Status  On-going   03/21/18 target date     PT LONG TERM GOAL  #11   TITLE  Patient will step up onto a 6" step 5x with AD independently to increase community mobility    Baseline  6/20: challenging to patient ascending steps, supervision ; 8/8: difficult clearing L foot /14: can step up curb independently      Time  12    Period  Weeks    Status  Achieved   01/03/18 target date           Plan - 07/13/18 1500    Clinical Impression Statement  Patient has reports of a headache for the last week. Patient responded minimally to intermittent manual traction , OA release x 5 mins and upper trap stretcches bilaterally . Patinet is educated on HEP and will continue to benefit from skilled PT to improve quality of life.     Rehab Potential  Good    Clinical Impairments Affecting Rehab Potential  weakness and decreased standing balance    PT Frequency  1x / week    PT Duration  12 weeks    PT Treatment/Interventions  Therapeutic exercise;Therapeutic activities;Gait training;Balance training;Stair training;DME Instruction;Neuromuscular re-education;Patient/family education;Manual techniques    PT Next Visit Plan  Continue POC    PT Home Exercise Plan  added seated dynamic core UE and LE lift offs with visual cues using mirror, supine marches       Patient will benefit from skilled therapeutic intervention in order to improve the following deficits and impairments:  Abnormal gait, Decreased balance, Decreased endurance, Difficulty walking, Decreased strength  Visit Diagnosis: Muscle weakness (generalized)  Other lack of coordination  Difficulty in walking, not elsewhere classified  Lack of coordination  Self-care deficit for dressing and grooming  Weakness generalized  Personal history of fall     Problem List There are no active problems to display for this patient.   [address], [place] DPT 07/13/2018, 3:10 PM  [place] MAIN [facility] SERVICES [place], [place], [address] Phone: [phone]   Fax:  [phone]  Name: Michelle Randall MRN: [id number] Date of Birth: November 26, 1995

## 2018-07-14 ENCOUNTER — Encounter: Payer: Self-pay | Admitting: Occupational Therapy

## 2018-07-14 ENCOUNTER — Ambulatory Visit: Payer: Medicaid Other | Admitting: Occupational Therapy

## 2018-07-14 DIAGNOSIS — M6281 Muscle weakness (generalized): Secondary | ICD-10-CM

## 2018-07-14 DIAGNOSIS — R278 Other lack of coordination: Secondary | ICD-10-CM

## 2018-07-14 NOTE — Therapy (Signed)
[place] MAIN [facility] SERVICES [address] [place], [place], [address] Phone: [phone]   Fax:  [phone]  Occupational Therapy Treatment  Patient Details  Name: Michelle Randall MRN: [id number] Date of Birth: 10/26/1996 Referring Provider: Langley Gauss MD   Encounter Date: 07/14/2018  OT End of Session - 07/14/18 0845    Visit Number  126    Number of Visits  142    Date for OT Re-Evaluation  08/10/18    Authorization Type  medicaid visit 95 of 118    OT Start Time  0830    OT Stop Time  0916    OT Time Calculation (min)  46 min    Activity Tolerance  Patient tolerated treatment well    Behavior During Therapy  [facility] for tasks assessed/performed       History reviewed. No pertinent past medical history.  History reviewed. No pertinent surgical history.  There were no vitals filed for this visit.  Subjective Assessment - 07/14/18 0842    Subjective   Patient reports she has had a headache for a week and went to the doctor and diagnosed with tension headaches and was prescribed medication.  She felt dizzy yesterday and almost fell.  She is going back to the doctor today.     Patient Stated Goals  To be as independent as possible at home and at school.     Currently in Pain?  Yes    Pain Score  8     Pain Location  Head    Pain Orientation  Anterior    Pain Descriptors / Indicators  Pounding    Pain Type  Acute pain    Pain Onset  In the past 7 days    Pain Frequency  Constant    Multiple Pain Sites  No        Patient seen for manipulation of small 1/2 to 1/4 sized objects to pick up and place onto elevated surface, cues  For prehension patterns.  Working towards higher level hand skills of translatory movements of the hand (moving items From fingertips to palm and back) as well as using the hand for storage.  Finger strengthening with bilateral hands to manipulate pieces that push and lock together with resistance.  Increased difficulty with The increased resistance at times.                    OT Education - 07/14/18 0845    Education provided  Yes    Education Details  fine motor coordination, strengthening    Person(s) Educated  Patient    Methods  Explanation;Demonstration    Comprehension  Verbalized understanding;Returned demonstration;Tactile cues required          OT Long Term Goals - 07/14/18 0846      OT LONG TERM GOAL #1   Title  Patient will demonstrate increased strength in right hand sufficient to scoop ice cream from frozen container with modified independence.    Baseline  update 05/24/2018:  pt has not had ice cream in a while, she will try soon.    Time  12    Period  Weeks    Status  On-going      OT LONG TERM GOAL #2   Title  Patient will be able to make her bed with modified independence.    Baseline  update 05/24/2018:  still takes her an extended period of time, and occasionally requires help with  heavier comforter    Time  6    Period  Weeks    Status  On-going      OT LONG TERM GOAL #3   Title  Patient will complete back closure bra donning with modified independence.     Baseline  max assist at eval    Time  12    Period  Weeks    Status  Achieved      OT LONG TERM GOAL #4   Title  Patient will increase BUE strength by 1 mm grade to be able to self propel her wheelchair over carpet for distances greater than 300 feet    Baseline  update 05/24/2018:  she is able to go longer distances up to 200 feet but has difficulty with rocky, uneven areas.     Time  12    Period  Weeks    Status  On-going      OT LONG TERM GOAL #5   Title  Patient will demonstrate the ability to perform small buttons on shirts independently and with good speed in order to dress for school.    Baseline  goal update: Patient can perform with increased time but occasionally needs assist if rushed, still difficult at goal update 11/23/2017    Time  12    Period  Weeks     Status  Achieved      OT LONG TERM GOAL #6   Title  Patient will donn and doff small earrings with modified independence.    Baseline  update 05/24/2018:  small earrings are still difficult and she has not worn in a while     Time  12    Period  Weeks    Status  Partially Met      OT LONG TERM GOAL #7   Title  Patient will demonstrate cutting vegetables with modified independence with use of adapted equipment and modified techniques    Baseline  unable to cut hard vegetables such as carrots 05/24/18    Time  8    Period  Weeks    Status  New      OT LONG TERM GOAL #8   Title  Patient to complete am self care demonstrating efficient methods of dressing skills to get to school on time.     Baseline  Goal update:  patient reports it takes about an hour to get dressed, running late one time this week.  Still working on efficiency and speed of tasks    Time  12    Period  Weeks    Status  Achieved      OT LONG TERM GOAL  #9   Baseline  Patient will don/doff socks and AFO braces with modified independence using adaptive equipment as needed.  Socks and braces are more difficult after showering and if socks are new, otherwise she can do it.     Time  12    Period  Weeks    Status  Achieved      OT LONG TERM GOAL  #10   TITLE  Patient will demonstrate the ability to pull jeans down over AFOs with modified independence.     Baseline  update 05/24/2018:  more difficulty if she wears skinny jeans.    Time  12    Period  Weeks    Status  On-going      OT LONG TERM GOAL  #11   TITLE  Patient will demonstrate moving objects in the  kitchen from one surface to another with modified independence and good safety techniques.     Baseline  unable to move larger bowls from one place to another.  05/24/2018    Time  12    Period  Weeks    Status  New      OT LONG TERM GOAL  #12   TITLE  Patient will demonstrate increased grip strength to be able to wring out kitchen cloth sufficiently so water does  not drip onto the floor and cause a slip hazard.     Baseline  05/24/18:has difficulty getting all the water out of cloth 05/24/2018    Time  8    Period  Weeks    Status  New      OT LONG TERM GOAL  #13   TITLE  Patient will demonstrate ability to use whisk to scramble eggs independently.    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     Baseline  Continued difficulty coordinating the use of both hands to complete task, speed still an issue 11/23/17    Status  Achieved      OT LONG TERM GOAL  #15   TITLE  Patient will complete meal preparation with minimal assist.     Baseline  update:  continues to require moderate assist with meal prep    Time  12    Period  Weeks    Status  On-going            Plan - 07/14/18 0846    Clinical Impression Statement  Patient reports she has had a headache for the last week, went to MD and taking medicine and now has some dizziness and nausea in the past day. This has been limiting her ability to participate in therapy and school related tasks as well as increased risk for falls.  She is planning to return to the doctor this afternoon for a follow up.  Patient was able to work towards fine motor coordination and finger strengthening exercises in sitting today with cues, dropping items frequently.     Occupational Profile and client history currently impacting functional performance  decreased hand strength, balance and coordination.    Occupational performance deficits (Please refer to evaluation for details):  ADL's;IADL's;Leisure;Education    Rehab Potential  Good    OT Frequency  1x / week    OT Duration  12 weeks    OT Treatment/Interventions  Self-care/ADL training;DME and/or AE instruction;Therapeutic  activities;Therapeutic exercise;Neuromuscular education;Patient/family education    Consulted and Agree with Plan of Care  Patient       Patient will benefit from skilled therapeutic intervention in order to improve the following deficits and impairments:  Impaired UE functional use, Difficulty walking, Decreased strength, Decreased coordination, Decreased endurance, Decreased balance  Visit Diagnosis: Muscle weakness (generalized)  Other lack of coordination    Problem List There are no active problems to display for this patient.  Achilles Dunk, OTR/L, CLT  Sherian Valenza 07/14/2018, 11:03 AM  [place] MA[place] [facility] SERVICES [address] [place], [place], [address] Phone: [phone]   Fax:  [phone]  Name: Michelle Randall MRN: [id number] Date of Birth: 1996-10-17

## 2018-07-16 ENCOUNTER — Other Ambulatory Visit: Payer: Self-pay

## 2018-07-16 DIAGNOSIS — Q050 Cervical spina bifida with hydrocephalus: Secondary | ICD-10-CM | POA: Diagnosis not present

## 2018-07-16 DIAGNOSIS — Y828 Other medical devices associated with adverse incidents: Secondary | ICD-10-CM | POA: Insufficient documentation

## 2018-07-16 DIAGNOSIS — T85618A Breakdown (mechanical) of other specified internal prosthetic devices, implants and grafts, initial encounter: Secondary | ICD-10-CM | POA: Diagnosis not present

## 2018-07-16 DIAGNOSIS — Z3202 Encounter for pregnancy test, result negative: Secondary | ICD-10-CM | POA: Insufficient documentation

## 2018-07-16 DIAGNOSIS — G936 Cerebral edema: Secondary | ICD-10-CM | POA: Diagnosis not present

## 2018-07-16 DIAGNOSIS — R51 Headache: Secondary | ICD-10-CM | POA: Diagnosis present

## 2018-07-16 NOTE — ED Triage Notes (Signed)
Patient report having headache, states seen at [facility] x 2 last week and told dehydrated and to drink more water.  Reports she drank more water until she threw up and continues to have the headache.

## 2018-07-17 ENCOUNTER — Emergency Department: Payer: Medicaid Other

## 2018-07-17 ENCOUNTER — Emergency Department
Admission: EM | Admit: 2018-07-17 | Discharge: 2018-07-17 | Disposition: A | Discharge status: Other Acute Inpatient Hospital | Payer: Medicaid Other | Attending: Emergency Medicine | Admitting: Emergency Medicine

## 2018-07-17 ENCOUNTER — Encounter: Payer: Self-pay | Admitting: Emergency Medicine

## 2018-07-17 ENCOUNTER — Other Ambulatory Visit: Payer: Medicaid Other

## 2018-07-17 DIAGNOSIS — T8509XA Other mechanical complication of ventricular intracranial (communicating) shunt, initial encounter: Secondary | ICD-10-CM | POA: Insufficient documentation

## 2018-07-17 DIAGNOSIS — T85618A Breakdown (mechanical) of other specified internal prosthetic devices, implants and grafts, initial encounter: Secondary | ICD-10-CM

## 2018-07-17 DIAGNOSIS — G936 Cerebral edema: Secondary | ICD-10-CM

## 2018-07-17 DIAGNOSIS — G919 Hydrocephalus, unspecified: Secondary | ICD-10-CM

## 2018-07-17 HISTORY — DX: Neurogenic bowel, not elsewhere classified: K59.2

## 2018-07-17 HISTORY — DX: Neuromuscular dysfunction of bladder, unspecified: N31.9

## 2018-07-17 HISTORY — DX: Presence of cerebrospinal fluid drainage device: Z98.2

## 2018-07-17 HISTORY — DX: Spina bifida, unspecified: Q05.9

## 2018-07-17 LAB — CBC WITH DIFFERENTIAL/PLATELET
BASOS PCT: 1 %
Basophils Absolute: 0.1 10*3/uL (ref 0–0.1)
EOS ABS: 0.1 10*3/uL (ref 0–0.7)
EOS PCT: 1 %
HCT: 42 % (ref 35.0–47.0)
Hemoglobin: 14.4 g/dL (ref 12.0–16.0)
Lymphocytes Relative: 39 %
Lymphs Abs: 5.3 10*3/uL — ABNORMAL HIGH (ref 1.0–3.6)
MCH: 27.2 pg (ref 26.0–34.0)
MCHC: 34.2 g/dL (ref 32.0–36.0)
MCV: 79.6 fL — ABNORMAL LOW (ref 80.0–100.0)
Monocytes Absolute: 1.3 10*3/uL — ABNORMAL HIGH (ref 0.2–0.9)
Monocytes Relative: 10 %
Neutro Abs: 6.9 10*3/uL — ABNORMAL HIGH (ref 1.4–6.5)
Neutrophils Relative %: 49 %
PLATELETS: 422 10*3/uL (ref 150–440)
RBC: 5.28 MIL/uL — ABNORMAL HIGH (ref 3.80–5.20)
RDW: 15.8 % — ABNORMAL HIGH (ref 11.5–14.5)
WBC: 13.6 10*3/uL — ABNORMAL HIGH (ref 3.6–11.0)

## 2018-07-17 LAB — COMPREHENSIVE METABOLIC PANEL
ALBUMIN: 4.3 g/dL (ref 3.5–5.0)
ALT: 16 U/L (ref 0–44)
AST: 18 U/L (ref 15–41)
Alkaline Phosphatase: 89 U/L (ref 38–126)
Anion gap: 7 (ref 5–15)
BUN: 15 mg/dL (ref 6–20)
CHLORIDE: 101 mmol/L (ref 98–111)
CO2: 26 mmol/L (ref 22–32)
Calcium: 9.5 mg/dL (ref 8.9–10.3)
Creatinine, Ser: 0.53 mg/dL (ref 0.44–1.00)
GFR calc non Af Amer: >60 mL/min (ref >60)
GLUCOSE: 105 mg/dL — AB (ref 70–99)
POTASSIUM: 3.9 mmol/L (ref 3.5–5.1)
Sodium: 134 mmol/L — ABNORMAL LOW (ref 135–145)
Total Bilirubin: 0.3 mg/dL (ref 0.3–1.2)
Total Protein: 8.2 g/dL — ABNORMAL HIGH (ref 6.5–8.1)

## 2018-07-17 LAB — HCG, QUANTITATIVE, PREGNANCY: hCG, Beta Chain, Quant, S: <1 m[IU]/mL (ref <5)

## 2018-07-17 MED ORDER — SODIUM CHLORIDE 0.9 % IV BOLUS
1000.0000 mL | Freq: Once | INTRAVENOUS | Status: AC
  Administered 2018-07-17: 1000 mL via INTRAVENOUS

## 2018-07-17 NOTE — ED Notes (Signed)
EMTALA reviewed by this RN.  

## 2018-07-17 NOTE — ED Notes (Signed)
Patient transported to CT 

## 2018-07-17 NOTE — ED Provider Notes (Signed)
[facility] Emergency Department Provider Note  ____________________________________________   First MD Initiated Contact with Patient 07/17/18 0122     (approximate)  I have reviewed the triage vital signs and the nursing notes.   HISTORY  Chief Complaint Headache    HPI Michelle Randall is a 22 y.o. female with medical history as listed below which most notably includes spina bifida with VP shunt placed when she was an infant at [facility].  She presents for evaluation of persistent and gradually worsening headache which is become severe.  The headache started 1 to 2 weeks ago and is gradually been getting worse.  It is a generalized throbbing headache and nothing in particular makes it better or worse.  She saw a mid-level provider at her primary care office a little over week ago and her symptoms were thought to be due to dehydration.  She then came back a week later (a couple of days ago) and was seen again for worsening headache as well as some dizziness when she stands (she is able to walk with crutches).  They performed blood work and gave her return precautions again and felt that it was secondary to dehydration.  When her symptoms were worse tonight and the dizziness with standing is been getting worse, her parents brought her to the emergency department for further evaluation.  In triage she was noted to be tachycardic at about 130.  She denies fever/chills, neck pain, neck stiffness, visual changes, chest pain, shortness of breath, nausea, vomiting, abdominal pain.  She has no numbness nor tingling in her extremities.  She has had no neurological symptoms different from baseline.  Past Medical History:  Diagnosis Date  . Neurogenic bladder   . Neurogenic bowel   . S/P VP shunt   . Spina bifida (HCC)     There are no active problems to display for this patient.   Past Surgical History:  Procedure Laterality Date  . VENTRICULOPERITONEAL SHUNT       Prior to Admission medications   Medication Sig Start Date End Date Taking? Authorizing Provider  oxybutynin (DITROPAN XL) 15 MG 24 hr tablet Take 15 mg by mouth at bedtime.    [provider]    Allergies Latex  History reviewed. No pertinent family history.  Social History Social History   Tobacco Use  . Smoking status: Never Smoker  . Smokeless tobacco: Never Used  Substance Use Topics  . Alcohol use: No  . Drug use: No    Review of Systems Constitutional: No fever/chills Eyes: No visual changes. ENT: No sore throat. Cardiovascular: Denies chest pain. Respiratory: Denies shortness of breath. Gastrointestinal: No abdominal pain.  No nausea, no vomiting.  No diarrhea.  No constipation. Genitourinary: Negative for dysuria. Musculoskeletal: Negative for neck pain.  Negative for back pain. Integumentary: Negative for rash. Neurological: No acute neurological deficits but gradually worsening headache over several weeks as described above, now with dizziness with standing.   ____________________________________________   PHYSICAL EXAM:  VITAL SIGNS: ED Triage Vitals  Enc Vitals Group     BP 07/16/18 2334 107/68     Pulse Rate 07/16/18 2334 (!) 132     Resp 07/16/18 2334 16     Temp 07/16/18 2334 98.5 F (36.9 C)     Temp Source 07/16/18 2334 Oral     SpO2 07/16/18 2334 100 %     Weight 07/16/18 2340 50.8 kg (112 lb)     Height 07/16/18 2340 1.422 m (  4\' 8" )     Head Circumference --      Peak Flow --      Pain Score 07/16/18 2340 8     Pain Loc --      Pain Edu? --      Excl. in GC? --     Constitutional: Alert and oriented.  Pleasant, interactive, no acute distress Eyes: Conjunctivae are normal. PERRL. EOMI. Head: Atraumatic. Nose: No congestion/rhinnorhea. Mouth/Throat: Mucous membranes are moist. Neck: No stridor.  No meningeal signs.   Cardiovascular: Tachycardia but, regular rhythm. Good peripheral circulation. Grossly normal heart  sounds. Respiratory: Normal respiratory effort.  No retractions. Lungs CTAB. Gastrointestinal: Soft and nontender. No distention.  Musculoskeletal: No lower extremity tenderness nor edema. No gross deformities of extremities. Neurologic: Chronic disabilities in arms and legs as a result of the spina bifida but her strength is intact and consistent with her baseline according to the patient.  She has normal speech and no evidence of any cranial nerve deficits. Skin:  Skin is warm, dry and intact. No rash noted. Psychiatric: Mood and affect are normal. Speech and behavior are normal.  ____________________________________________   LABS (all labs ordered are listed, but only abnormal results are displayed)  Labs Reviewed  CBC WITH DIFFERENTIAL/PLATELET - Abnormal; Notable for the following components:      Result Value   WBC 13.6 (*)    RBC 5.28 (*)    MCV 79.6 (*)    RDW 15.8 (*)    Neutro Abs 6.9 (*)    Lymphs Abs 5.3 (*)    Monocytes Absolute 1.3 (*)    All other components within normal limits  COMPREHENSIVE METABOLIC PANEL - Abnormal; Notable for the following components:   Sodium 134 (*)    Glucose, Bld 105 (*)    Total Protein 8.2 (*)    All other components within normal limits  HCG, QUANTITATIVE, PREGNANCY   ____________________________________________  EKG  ED ECG REPORT I, Loleta Rose, the attending physician, personally viewed and interpreted this ECG.  Date: 07/16/2018 EKG Time: 23: 40 Rate: 125 Rhythm: Sinus tachycardia QRS Axis: normal Intervals: normal ST/T Wave abnormalities: Non-specific ST segment / T-wave changes, but no evidence of acute ischemia. Narrative Interpretation: no evidence of acute ischemia   ____________________________________________  RADIOLOGY I, Loleta Rose, personally viewed and evaluated these images (plain radiographs) as part of my medical decision making, as well as reviewing the written report by the radiologist.  ED MD  interpretation: Discontinuity of VP shunt of at least a 5 cm segment above the reservoir over the left occiput.  CT scan demonstrates severe hydrocephalus with cerebral edema, transependymal flow backing along the catheter tract, and cerebellar tonsillar herniation.  Official radiology report(s): Dg Skull 1-3 Views  Result Date: 07/17/2018 CLINICAL DATA:  22 year old female.  Shunt evaluation. EXAM: SKULL - 1-3 VIEW; ABDOMEN - 1 VIEW; CHEST 1 VIEW; DG CERVICAL SPINE - 1 VIEW COMPARISON:  Head CT dated 07/17/2018 FINDINGS: Left posterior parietal approach ventriculostomy shunt with tip to the right of the midline as seen in the frontal horn of the right lateral ventricle on the earlier CT. There is approximately 5 cm segment of discontinuity of the shunt over the occiput. The shunt extends inferiorly along the left lateral neck and left chest and left upper abdomen. The shunt tube crosses over to the right hemiabdomen at the level of L1-L2 and extends into the pelvis. The tip of the shunt is located in the right hemipelvis. The tubing inferior  to the reservoir is intact. Mild bend in the distal tube within the pelvis without sharp kink. There is no acute cardiopulmonary process. No bowel dilatation or evidence of obstruction. Chronic deformity of the right hip with mild subluxation and uncovering of the lateral femoral head. No acute osseous pathology. IMPRESSION: Discontinuity of the shunt above the reservoir over the left occiput. Electronically Signed   By: Elgie Collard M.D.   On: 07/17/2018 02:22   Dg Chest 1 View  Result Date: 07/17/2018 CLINICAL DATA:  22 year old female.  Shunt evaluation. EXAM: SKULL - 1-3 VIEW; ABDOMEN - 1 VIEW; CHEST 1 VIEW; DG CERVICAL SPINE - 1 VIEW COMPARISON:  Head CT dated 07/17/2018 FINDINGS: Left posterior parietal approach ventriculostomy shunt with tip to the right of the midline as seen in the frontal horn of the right lateral ventricle on the earlier CT. There is  approximately 5 cm segment of discontinuity of the shunt over the occiput. The shunt extends inferiorly along the left lateral neck and left chest and left upper abdomen. The shunt tube crosses over to the right hemiabdomen at the level of L1-L2 and extends into the pelvis. The tip of the shunt is located in the right hemipelvis. The tubing inferior to the reservoir is intact. Mild bend in the distal tube within the pelvis without sharp kink. There is no acute cardiopulmonary process. No bowel dilatation or evidence of obstruction. Chronic deformity of the right hip with mild subluxation and uncovering of the lateral femoral head. No acute osseous pathology. IMPRESSION: Discontinuity of the shunt above the reservoir over the left occiput. Electronically Signed   By: Elgie Collard M.D.   On: 07/17/2018 02:22   Dg Cervical Spine 1 View  Result Date: 07/17/2018 CLINICAL DATA:  22 year old female.  Shunt evaluation. EXAM: SKULL - 1-3 VIEW; ABDOMEN - 1 VIEW; CHEST 1 VIEW; DG CERVICAL SPINE - 1 VIEW COMPARISON:  Head CT dated 07/17/2018 FINDINGS: Left posterior parietal approach ventriculostomy shunt with tip to the right of the midline as seen in the frontal horn of the right lateral ventricle on the earlier CT. There is approximately 5 cm segment of discontinuity of the shunt over the occiput. The shunt extends inferiorly along the left lateral neck and left chest and left upper abdomen. The shunt tube crosses over to the right hemiabdomen at the level of L1-L2 and extends into the pelvis. The tip of the shunt is located in the right hemipelvis. The tubing inferior to the reservoir is intact. Mild bend in the distal tube within the pelvis without sharp kink. There is no acute cardiopulmonary process. No bowel dilatation or evidence of obstruction. Chronic deformity of the right hip with mild subluxation and uncovering of the lateral femoral head. No acute osseous pathology. IMPRESSION: Discontinuity of the shunt  above the reservoir over the left occiput. Electronically Signed   By: Elgie Collard M.D.   On: 07/17/2018 02:22   Dg Abd 1 View  Result Date: 07/17/2018 CLINICAL DATA:  22 year old female.  Shunt evaluation. EXAM: SKULL - 1-3 VIEW; ABDOMEN - 1 VIEW; CHEST 1 VIEW; DG CERVICAL SPINE - 1 VIEW COMPARISON:  Head CT dated 07/17/2018 FINDINGS: Left posterior parietal approach ventriculostomy shunt with tip to the right of the midline as seen in the frontal horn of the right lateral ventricle on the earlier CT. There is approximately 5 cm segment of discontinuity of the shunt over the occiput. The shunt extends inferiorly along the left lateral neck and left chest and  left upper abdomen. The shunt tube crosses over to the right hemiabdomen at the level of L1-L2 and extends into the pelvis. The tip of the shunt is located in the right hemipelvis. The tubing inferior to the reservoir is intact. Mild bend in the distal tube within the pelvis without sharp kink. There is no acute cardiopulmonary process. No bowel dilatation or evidence of obstruction. Chronic deformity of the right hip with mild subluxation and uncovering of the lateral femoral head. No acute osseous pathology. IMPRESSION: Discontinuity of the shunt above the reservoir over the left occiput. Electronically Signed   By: Elgie Collard M.D.   On: 07/17/2018 02:22   Ct Head Wo Contrast  Result Date: 07/17/2018 CLINICAL DATA:  Headache, recently diagnosed with dehydration. History of VP shunt. EXAM: CT HEAD WITHOUT CONTRAST TECHNIQUE: Contiguous axial images were obtained from the base of the skull through the vertex without intravenous contrast. COMPARISON:  None. FINDINGS: BRAIN: Severe ventriculomegaly, periventricular interstitial edema. Ventriculoperitoneal shunt via LEFT parietal burr hole with distal tip RIGHT frontal horn of the lateral ventricle. Low signal about the catheter tract. Diffuse sulcal effacement. No midline shift, acute large  vascular territory infarct or hemorrhage. Downward cerebellar tonsillar herniation. Effaced basal/prepontine cisterns. VASCULAR: Unremarkable. SKULL/SOFT TISSUES: No skull fracture. No significant soft tissue swelling. Discontinuous ventriculoperitoneal shunt catheter above reservoir. ORBITS/SINUSES: The included ocular globes and orbital contents are normal.Trace paranasal sinus mucosal thickening. Mastoid air cells are well aerated. OTHER: None. IMPRESSION: 1. Severe hydrocephalus with interstitial edema/transependymal flow backing along the catheter tract, LEFT VP shunt in place. 2. VP shunt is discontinuous above reservoir. 3. Cerebral edema, effaced cisterns with inferior cerebellar tonsillar herniation. 4. Critical Value/emergent results were called by telephone at the time of interpretation on 07/17/2018 at 2:01 am to Dr. Merrily Brittle , who verbally acknowledged these results. Electronically Signed   By: Awilda Metro M.D.   On: 07/17/2018 02:02    ____________________________________________   PROCEDURES  Critical Care performed: No   Procedure(s) performed:   Procedures   ____________________________________________   INITIAL IMPRESSION / ASSESSMENT AND PLAN / ED COURSE  As part of my medical decision making, I reviewed the following data within the electronic MEDICAL RECORD NUMBER Nursing notes reviewed and incorporated, Labs reviewed , EKG interpreted , Old chart reviewed, Discussed with neurosurgery at [facility], and reviewed Notes from prior ED visits    Differential diagnosis includes, but is not limited to, VP shunt malfunction with resulting hydrocephalus, shunt infection, dehydration/volume depletion, urinary tract infection.  The patient is generally well-appearing but is tachycardic in the 120s to 130s.  She is in no distress at this time with no neurological deficits.  Her lab work is notable for essentially normal conference of metabolic panel, negative pregnancy, and a mild  leukocytosis of 13.6.  The most urgent part of the work-up will be the CT without contrast of her head as well as the shunt series of x-rays.  She does not require any medication at this time but I have ordered 1 L normal saline because she admits that she drinks very little fluids and this could be related to volume depletion.  However feel it is incumbent upon me to evaluate for the possibility of hydrocephalus and shunt malfunction even though we do not have any imaging that I can find in the system against which to compare.  Clinical Course as of Jul 17 532  Mon Jul 17, 2018  0218 My partner, Dr. Lamont Snowball, received a call from radiology  informing him of the abnormal and critical results of malfunctioning VP shunt, severe hydrocephalus, cerebral edema, and cerebellar tonsil herniation.  Contacting [facility] neurosurgery for emergent transfer and additional management recommendations.  Patient remains neurologicall intact, update patient and her parents.    Unfortunately we are experiencing computer downtime on all but one computer in the ED which may delay the transfer process, but my Diplomatic Services operational officer has already contacted [facility].   [CF]  [phone] I spoke by phone with Dr. Lynwood Dawley with [facility] neurosurgery and discussed the case, including reading to him the CT interpretation with all the findings including the discontinuity of the shunt above the reservoir.  He suggested symptomatic treatment with IV fluids and PRN analgesia and as needed antiemetics as needed and wound requested that we transfer ED to ED.  The transfer center then spoke with Dr. Oren Beckmann with the [facility] ED who accepted the patient without needing to speak directly to me.  I have dated the patient and her family and she remains hemodynamically stable except for her tachycardia for which she is currently receiving a 1 L normal saline bolus.  She is also neurologically intact and at her baseline although she still becomes acutely dizzy when she stands  up.  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    Loleta Rose, MD 07/17/18 [phone]

## 2018-07-18 ENCOUNTER — Ambulatory Visit: Payer: Medicaid Other | Admitting: Occupational Therapy

## 2018-07-18 ENCOUNTER — Ambulatory Visit: Payer: Medicaid Other | Admitting: Physical Therapy

## 2018-07-18 MED ORDER — OXYBUTYNIN CHLORIDE 5 MG PO TABS
15.00 | ORAL_TABLET | ORAL | Status: DC
Start: 2018-07-18 — End: 2018-07-18

## 2018-07-18 MED ORDER — DOCUSATE SODIUM 100 MG PO CAPS
100.00 | ORAL_CAPSULE | ORAL | Status: DC
Start: 2018-07-18 — End: 2018-07-18

## 2018-07-18 MED ORDER — OXYCODONE-ACETAMINOPHEN 5-325 MG PO TABS
1.00 | ORAL_TABLET | ORAL | Status: DC
Start: ? — End: 2018-07-18

## 2018-07-18 MED ORDER — ACETAMINOPHEN 325 MG PO TABS
650.00 | ORAL_TABLET | ORAL | Status: DC
Start: ? — End: 2018-07-18

## 2018-07-18 MED ORDER — NITROFURANTOIN MONOHYD MACRO 100 MG PO CAPS
100.00 | ORAL_CAPSULE | ORAL | Status: DC
Start: 2018-07-18 — End: 2018-07-18

## 2018-07-18 MED ORDER — MORPHINE SULFATE 2 MG/ML IJ SOLN
2.00 | INTRAMUSCULAR | Status: DC
Start: ? — End: 2018-07-18

## 2018-07-18 MED ORDER — IMIPRAMINE HCL 25 MG PO TABS
25.00 | ORAL_TABLET | ORAL | Status: DC
Start: 2018-07-18 — End: 2018-07-18

## 2018-07-18 MED ORDER — BISACODYL 10 MG RE SUPP
10.00 | RECTAL | Status: DC
Start: 2018-07-19 — End: 2018-07-18

## 2018-07-20 ENCOUNTER — Encounter: Payer: Self-pay | Admitting: Occupational Therapy

## 2018-07-20 ENCOUNTER — Ambulatory Visit: Payer: Medicaid Other | Attending: Student | Admitting: Occupational Therapy

## 2018-07-20 ENCOUNTER — Ambulatory Visit: Payer: Medicaid Other

## 2018-07-20 DIAGNOSIS — M6281 Muscle weakness (generalized): Secondary | ICD-10-CM | POA: Diagnosis not present

## 2018-07-20 DIAGNOSIS — R262 Difficulty in walking, not elsewhere classified: Secondary | ICD-10-CM | POA: Insufficient documentation

## 2018-07-20 DIAGNOSIS — Z9181 History of falling: Secondary | ICD-10-CM | POA: Insufficient documentation

## 2018-07-20 DIAGNOSIS — R278 Other lack of coordination: Secondary | ICD-10-CM | POA: Diagnosis present

## 2018-07-20 NOTE — Therapy (Signed)
[place] MAIN [facility] SERVICES [address] [place], [place], [address] Phone: [phone]   Fax:  [phone]  Physical Therapy Treatment  Patient Details  Name: Michelle Randall MRN: [id number] Date of Birth: 1996/04/19 No data recorded  Encounter Date: 07/20/2018  PT End of Session - 07/21/18 1257    Visit Number  136    Number of Visits  147    Date for PT Re-Evaluation  09/14/18    Authorization Type  7/12    Authorization Time Period  12 visits 5/29-8/20    PT Start Time  1430    PT Stop Time  1510    PT Time Calculation (min)  40 min    Equipment Utilized During Treatment  Gait belt    Activity Tolerance  Patient tolerated treatment well    Behavior During Therapy  WFL for tasks assessed/performed       Past Medical History:  Diagnosis Date  . Neurogenic bladder   . Neurogenic bowel   . S/P VP shunt   . Spina bifida [facility])     Past Surgical History:  Procedure Laterality Date  . VENTRICULOPERITONEAL SHUNT      There were no vitals filed for this visit.  Subjective Assessment - 07/21/18 0848    Subjective   Today's session was limited due to recent hospitalization/emergency stent placement. Patient went to doctor twice last week and was told it was dehydration and that nothing was wrong with shunt. Thursday saw occupational therapy and went to doctor again later that afternoon. Was told that if headaches didn't get better than go to ER and get an IV fluid so Saturday went to sister's house for BBQ and patient fainted and threw up. Went to ER that night where they did a CT scan.  Patient admitted to hospital over the weekend for hydrocephalus via shunt failure. Emergency shunt revision performed and patient d/c after 24 stay. For next two weeks no laying down, no increasing heartrate, and no lifting bending twisting. Patient had a change of medical status resulting in need for re-assessment of functional mobility and goals.      Patient is accompained by:  Family member    Limitations  Walking;Standing;House hold activities;Other (comment)    How long can you stand comfortably?  require use of bilateral crutches    How long can you walk comfortably?  dependent upon terrain    Patient Stated Goals  Patient wants to be able to negotiate uneven terrain at school, cross thresholds in home environment, and negotiate steps in household     Pain Onset  In the past 7 days         DGI- contraindicated due to precautions BERG- contraindicated due to precautions Car transfer 6 MWT- contraindicated due to precautions  10 MWT : 21 seconds- terminated due to increased HR  Ambulate inclines: inclines more challenging than declines.  Transfer floor to standing    New goal: safety navigate uneven surfaces and curbs : around [place] there are uneven bricks-difficulty picking up feet.   Difficulty with thresholds in buildings.   Steps to get into sisters house which are four higher steeper steps with no railing.       Right Left  Hip flexion 4/5 4-/5  Hip Abduction 1+/5 2-/5   Hip Adduction 3-/5 3-/5  Knee Extension  4/5 4/5  Knee Flexion 2+/5 2+/5  DF    PF        opp arm  opp leg 10x each leg  -difficulty moving one leg without activating opp leg due to difficulty performing reciprocating movements.   Isometric: adduction 10x 5 second holds, abduction 10x 5 second holds: abduction challenging due to limited innervation at that level.   Vitals : 109/73 pulse 107  Patient Patient is a pleasant 22 year old female who presents for physical therapy s/p hospitalization. Patient admitted to hospital over the weekend for hydrocephalus via shunt failure. Emergency shunt revision performed and patient d/c after 24 stay. For next two weeks her precautions include: no laying down, no increasing heartrate, and no lifting bending twisting. Patient had a change of medical status resulting in need for re-assessment of functional  mobility and goals however, due to her current precautions many goals are unable to be assessed at this time. Fortunately the patient's goals were re-assessed within a months time. Her goal progression for the past few re-tests have shown the need for continued therapy as she has a noted drop in scores during her retesting on 6/20 after returning to therapy from multiple absences including: being abroad, performing an internship, and fasting for Ramadan. After her return to therapy with consistent visits she demonstrated a return/increase in functional scoring/goals with an additional new ability to be able to negotiate curbs with supervision.  Since returning to therapy patient has demonstrated improvements indicating need for continued therapy for progression and preservation of functional mobility to decrease reliance upon power chair and increase independence in her natural environment. 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 The same challenge of lifting her legs for clearance of obstacles/terrain that affects her mobility at school additionally affects her ability to negotiate thresholds in home and family environment. Patient is limited in ability to interact and visit her sister due to her sister's house having four steep steps to enter.  Not all locations patient frequents is completely accessible requiring her to negotiate her surroundings with assistive device rather than wheelchair. A continued need for therapy after her 22st birthday next week will include the following planned interventions: postural education and alignment, passive and active range of motion, home exercise training/teaching, hands on therapeutic activities to support age-appropriate development, manual therapy techniques and facilitation,  neuromuscular re-education, balance/coordination activities, strength training and exercises. Patient will continue to benefit from skilled physical therapy to address above mentioned deficits and increase independent mobility for improved quality of life. requires vitals to be monitored throughout session to keep BP and HR down per precautions.                         PT Education - 07/21/18 1255    Education provided  Yes    Education Details  compliance with precautions, goals, current challenges/ difficulties with mobility     Person(s) Educated  Patient    Methods  Explanation    Comprehension  Verbalized understanding       PT Short Term Goals - 07/21/18 1300      PT SHORT TERM GOAL #1   Title  Patient will report compliance with HEP for continued strengthening and stability during functional mobility.     Baseline  HEP compliant    Time  2    Period  Weeks    Status  Achieved        PT Long Term Goals -  07/21/18 1300      PT LONG TERM GOAL #1   Title  Patient will improve Dynamic Gait Index (DGI) score to > 21/24 for meaningful improvement and low falls risk regarding dynamic walking tasks (revised from > 19/24 )    Baseline  14/24; 11/02/16: 15/24 01/28/17: 18/24 6/5: 19/24; 08/23/17 14/24,  14/24 10/11/17 14/24 12/20/17, 12/27/17/  14/24 9/6 deferred due to precautions    Time  12    Period  Weeks    Status  On-going      PT LONG TERM GOAL #2   Title  Patient will increase Berg Balance score by >51/56 points to be considered a low risk for falls for improved safety. (revised from >6 points improvement)    Baseline  11/02/16: 34/56 01/28/17: 39/56 6/5: 45/56; 08/23/2017 = 45/56, 43/56 10/11/17, 43/56 12/20/17, 12/27/17  43/56 6/20: 38/56 no UE support ; 8/8: 40/56 no UE support 9/6: deferred due to precautions    Time  12    Period  Weeks    Status  On-going      PT LONG TERM GOAL #3   Title  Patient will be able to transfer in and out of a large car with  a  high seat with CGA  to improve ability to go to school/doctor visits.     Baseline  Patient requires min A for transfer into large SUV unless it has a step rail on the side; 5/15: pt requires assist with a really high car but can get out of an SUV without assist, typically needs more assits getting in and getting in on the L side; 6/20: if ledge able to do with little to no assistance.  9/6 deferred due to precautions     Time  12    Period  Weeks    Status  Partially Met      PT LONG TERM GOAL #4   Title  Patient will complete a TUG test in < 12 seconds for independent mobility and decreased fall risk     Baseline  11.45; 08/23/2017 = 13.66 sec, 13.40 12/20/17, 12/27/17 12.07 sec; 5/15: 13.42 seconds 6/20: 12 seconds     Time  12    Period  Weeks    Status  Achieved      PT LONG TERM GOAL #5   Title  Patient will improve 6 minute walk distance by > 150 ft for improved return to functional community activities     Baseline  740 01/28/2017: 759f 6/5: 795  730 on 07/19/17; 790 ft on 08/23/18,  740 feet, 730 feet 12/20/17, 12/27/17 650 feet 6/20: 665 8/8: 701 9/6: deferred due to precautions    Time  12    Period  Weeks    Status  On-going      PT LONG TERM GOAL #6   Title  Patient will improve gait speed to > 1.2 m/s with least restrictive assistive device to return to normal walking speed     Baseline  .76 m/h 01/28/2017: .87  07/19/17 0.652m; 0.77 m/s on 08/23/17, . 58 m/sec 10/11/17 .[id number] 6/20: 77 m/s  8/8: .83m58m/6: terminated due to precuations for HR    Time  12    Period  Weeks    Status  On-going   03/21/18 target dat     PT LONG TERM GOAL #7   Title  Patient (< 60 y26rs old) will complete five times sit to stand test in < 10 seconds indicating  an increased LE strength and improved balance     Baseline  9.45 sec; 9.63 on 08/23/17, 9.63 sec 12/27/17, 10.07 sec    Time  12    Period  Weeks    Status  Achieved   03/26/18 target date     PT LONG TERM GOAL #8   Title  Patient will be able to  ambulate on inclines and grass independenlty with LRAD    Baseline  Can ambulate over grassy inclines with decreased speed and safety requiring need loftstrand curtches and SBA; 5/15: pt reports she has to walk slowly and feels unsteady but that this is improving6/20 : reports cautious and slow walk, able to do mod I9/6:  deffered due to precautions    Time  12    Period  Weeks    Status  On-going   03/21/18 target date     PT LONG TERM GOAL  #9   TITLE  Patient will be able to transfer from low chair or stool without UE support independently    Baseline  able to perform independently when not tired, but when fatigued requires support.     Time  12    Period  Weeks    Status  Partially Met   03/21/18 target date     PT LONG TERM GOAL  #10   TITLE  Patient will be able to transfer from the floor to standing independently with use of LRAD    Baseline  difficult on different types of floor. Slippery floors more challenging and how long on the floor. Only need assistance when not able to put crutch against surface.     Time  12    Period  Weeks    Status  On-going   03/21/18 target date     PT LONG TERM GOAL  #11   TITLE  Patient will step up onto a 6" step 5x with AD independently to increase community mobility    Baseline  6/20: challenging to patient ascending steps, supervision ; 8/8: difficult clearing L foot /14: can step up curb independently     Time  12    Period  Weeks    Status  Achieved   01/03/18 target date           Plan - 07/21/18 1258    Clinical Impression Statement  Patient is a pleasant 22 year old female who presents for physical therapy s/p hospitalization. Patient admitted to hospital over the weekend for hydrocephalus via shunt failure. Emergency shunt revision performed and patient d/c after 24 stay. For next two weeks her precautions include: no laying down, no increasing heartrate, and no lifting bending twisting. Patient had a change of medical status resulting  in need for re-assessment of functional mobility and goals however, due to her current precautions many goals are unable to be assessed at this time. Fortunately the patient's goals were re-assessed within a months time. Her goal progression for the past few re-tests have shown the need for continued therapy as she has a noted drop in scores during her retesting on 6/20 after returning to therapy from multiple absences including: being abroad, performing an internship, and fasting for Ramadan. After her return to therapy with consistent visits she demonstrated a return/increase in functional scoring/goals with an additional new ability to be able to negotiate curbs with supervision.  Since returning to therapy patient has demonstrated improvements indicating need for continued therapy for progression and preservation of functional mobility to decrease  reliance upon power chair and increase independence in her natural environment. 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 The same challenge of lifting her legs for clearance of obstacles/terrain that affects her mobility at school additionally affects her ability to negotiate thresholds in home and family environment. Patient is limited in ability to interact and visit her sister due to her sister's house having four steep steps to enter.  Not all locations patient frequents is completely accessible requiring her to negotiate her surroundings with assistive device rather than wheelchair. A continued need for therapy after her 22st birthday next week will include the following planned interventions: postural education and alignment, passive and active range of motion, home exercise training/teaching, hands on therapeutic activities to support age-appropriate development, manual therapy  techniques and facilitation, neuromuscular re-education, balance/coordination activities, strength training and exercises. Patient will co    Rehab Potential  Good    Clinical Impairments Affecting Rehab Potential  weakness and decreased standing balance    PT Frequency  1x / week    PT Duration  12 weeks    PT Treatment/Interventions  Therapeutic exercise;Therapeutic activities;Gait training;Balance training;Stair training;DME Instruction;Neuromuscular re-education;Patient/family education;Manual techniques;Functional mobility training;Passive range of motion    PT Next Visit Plan  Continue POC    PT Home Exercise Plan  added seated dynamic core UE and LE lift offs with visual cues using mirror, supine marches       Patient will benefit from skilled therapeutic intervention in order to improve the following deficits and impairments:  Abnormal gait, Decreased balance, Decreased endurance, Difficulty walking, Decreased strength, Decreased activity tolerance, Decreased coordination, Decreased mobility, Decreased range of motion, Impaired flexibility, Postural dysfunction, Improper body mechanics  Visit Diagnosis: Muscle weakness (generalized)  Other lack of coordination  Difficulty in walking, not elsewhere classified     Problem List There are no active problems to display for this patient.  Janna Arch, PT, DPT   07/21/2018, 1:16 PM  [place] MAIN [facility] SERVICES [address] [place], [place], [address] Phone: [phone]   Fax:  [phone]  Name: ALIVEAH GALLANT MRN: [id number] Date of Birth: 16-Dec-1995

## 2018-07-20 NOTE — Therapy (Signed)
[place] MAIN [facility] SERVICES [address] [place], [place], [address] Phone: [phone]   Fax:  [phone]  Occupational Therapy Treatment, Medicaid Reauthorization/Recertification Note  Patient Details  Name: GERALDYN SHAIN MRN: [id number] Date of Birth: Jan 30, 1996 No data recorded  Encounter Date: 07/20/2018  OT End of Session - 07/20/18 1354    Visit Number  127    Number of Visits  142    Date for OT Re-Evaluation  10/12/18    Authorization Type  medicaid visit 33 of 118    OT Start Time  1545    OT Stop Time  1430    OT Time Calculation (min)  1365 min    Activity Tolerance  Patient tolerated treatment well    Behavior During Therapy  WFL for tasks assessed/performed       Past Medical History:  Diagnosis Date  . Neurogenic bladder   . Neurogenic bowel   . S/P VP shunt   . Spina bifida [facility])     Past Surgical History:  Procedure Laterality Date  . VENTRICULOPERITONEAL SHUNT      There were no vitals filed for this visit.  Subjective Assessment - 07/20/18 1446    Subjective   Pt. had emergent surgery for a shunt revision over the weekend.    Patient is accompained by:  Family member    Patient Stated Goals  To be as independent as possible at home and at school.     Currently in Pain?  No/denies         [facility] OT Assessment - 07/20/18 0001      Coordination   Right 9 Hole Peg Test  19 sec.    Left 9 Hole Peg Test  22 sec.      Hand Function   Right Hand Grip (lbs)  43    Right Hand Lateral Pinch  13 lbs    Right Hand 3 Point Pinch  15 lbs    Left Hand Grip (lbs)  34    Left Hand Lateral Pinch  12 lbs    Left 3 point pinch  12 lbs      OT TREATMENT   Measurements were obtained, and goals were reviewed with the pt.    Neuro muscular re-education:  Pt. Worked on [facility] skills manipulating small earring backings, and simulated placing them on posts. Pt. Worked on grasping one at a time from a pile in a  container.                     OT Education - 07/20/18 1447    Education provided  Yes    Education Details  fine motor coordination, strengthening    Person(s) Educated  Patient    Methods  Explanation;Demonstration    Comprehension  Verbalized understanding;Returned demonstration;Tactile cues required          OT Long Term Goals - 07/20/18 1355      OT LONG TERM GOAL #1   Title  Patient will demonstrate increased strength in right hand sufficient to scoop ice cream from frozen container with modified independence.    Baseline  07/20/2018:  Pt. is able to independently scoop soft ice cream. Requires modA to scoop firm, hard ice cream.    Time  12    Period  Weeks    Status  On-going    Target Date  07/20/18      OT LONG TERM GOAL #2   Title  Patient will be able to make her bed with modified independence.    Baseline  07/20/2018: decreased efficiency with Increased time to complete. Pt. requires help with a heavier comforter.     Time  6    Period  Weeks    Status  On-going    Target Date  07/20/18      OT LONG TERM GOAL #3   Title  Patient will complete back closure bra donning with modified independence.     Baseline  max assist at eval    Time  12    Period  Weeks    Status  Achieved      OT LONG TERM GOAL #4   Title  Patient will increase BUE strength by 1 mm grade to be able to self propel her wheelchair over carpet for distances greater than 300 feet    Baseline  07/20/2018: Before the surgery, pt. was able to go longer distances up to 200 feet but had difficulty with rocky, uneven areas. Pt. now has restrictions following surgery, and is unable to propel her own chair.     Time  12    Period  Weeks    Status  On-going    Target Date  10/12/18      OT LONG TERM GOAL #5   Title  Patient will demonstrate the ability to perform small buttons on shirts independently and with good speed in order to dress for school.    Baseline  goal update: Patient  can manipulate buttons however needs assist, and has difficulty if rushed when dressing in the morning     Time  12    Period  Weeks    Status  Revised    Target Date  10/12/18      OT LONG TERM GOAL #6   Title  Patient will donn and doff small earrings with modified independence.    Baseline  07/20/2018: Pt. has difficulty manipulating the earring backings    Time  12    Period  Weeks    Status  Partially Met      OT LONG TERM GOAL #7   Title  Patient will demonstrate cutting vegetables with modified independence with use of adapted equipment and modified techniques    Baseline  07/20/2018: unable to cut hard vegetables such as carrots     Time  8    Period  Weeks    Status  On-going      OT LONG TERM GOAL #8   Title  Patient to complete am self care demonstrating efficient methods of dressing skills to get to school on time.     Baseline  Goal update:  patient reports it takes about an hour to get dressed, running late one time this week.  Still working on efficiency and speed of tasks (07/20/2018:since the surgery pt. is requiring assist secondary to surgical restrictions)    Time  12    Period  Weeks    Status  Achieved      OT LONG TERM GOAL  #9   Baseline  Patient will don/doff socks and AFO braces with modified independence using adaptive equipment as needed.   Socks and braces are more difficult after showering and if socks are new, otherwise she can do it.  (since recent surgery, pt. Requires assist to complete secondary to surgical restrictions.   Time  12    Period  Weeks    Status  Achieved  OT LONG TERM GOAL  #10   TITLE  Patient will demonstrate the ability to pull jeans down over AFOs with modified independence.     Baseline  07/20/2018:  more difficulty if she wears skinny jeans. (increased assist required secondary to surgical restrictions with bending, twisting, lifting, pulling)    Time  12    Period  Weeks    Status  On-going      OT LONG TERM GOAL  #11    TITLE  Patient will demonstrate moving objects in the kitchen from one surface to another with modified independence and good safety techniques.     Baseline  07/20/2018: unable to move larger bowls from one place to another.     Time  12    Period  Weeks    Status  On-going      OT LONG TERM GOAL  #12   TITLE  Patient will demonstrate increased grip strength to be able to wring out kitchen cloth sufficiently so water does not drip onto the floor and cause a slip hazard.     Baseline  07/20/18: Pt. has difficulty getting all the water out of cloth    Time  8    Period  Weeks    Status  On-going      OT LONG TERM GOAL  #13   TITLE  Patient will demonstrate ability to use whisk to scramble eggs independently.    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     Baseline  Continued difficulty coordinating the use of both hands to complete task, speed still an issue     Time  6    Period  Months    Status  Achieved      OT LONG TERM GOAL  #15   TITLE  Patient will complete meal preparation with minimal assist.     Baseline  07/20/2018: continues to require moderate assist with meal prep    Time  12    Period  Weeks    Status  On-going            Plan - 07/20/18 1448    Clinical Impression Statement Pt. has had emergent surgery for shunt revision over the weekend. Pt. now has restrictions for no bending, twisting, or lifting over 10 lbs until cleared by the neurosurgeon. Pt. has to avoid streuous activity for 2 weeks. She has to avoid laying down in supine, or activity that could result in a bump, or blow to the head.  Pt. has difficulty performing daily self-care tasks with these restrictions, and requires increased assistance to complete. Pt.  continues to present with decreased strength, grip strength, and [place] skills hindering her ability to perfrom buttoning efficiently, applying earrings, and securely fastening the backings, scooping ice cream from a container, and making her bed thoroughly, and efficiently prior to attending classes in the morning. Pt. has had her car modifications completed, and is planning to have her bathroom renovated to optimize independence, and accessibility in the bathroom. Pt. plans to continue taking college level courses and continues to work towards improving her overall functional level in order to achieve  independence with ADLs, IADLs,, and independent living.    Occupational Profile and client history currently impacting functional performance  decreased hand strength, balance and coordination.    Occupational performance deficits (Please refer to evaluation for details):  ADL's;IADL's;Leisure;Education    Rehab Potential  Good    OT Frequency  1x / week    OT Duration  12 weeks    OT Treatment/Interventions  Self-care/ADL training;DME and/or AE instruction;Therapeutic activities;Therapeutic exercise;Neuromuscular education;Patient/family education    Plan  Occupational Therapy 1 x per week for ADL and strenghtening.    OT Home Exercise Plan  try elastic shoe laces and new tech for bra donning and doffing    Consulted and Agree with Plan of Care  Patient    Family Member Consulted  mom       Patient will benefit from skilled therapeutic intervention in order to improve the following deficits and impairments:  Impaired UE functional use, Difficulty walking, Decreased strength, Decreased coordination, Decreased endurance, Decreased balance  Visit Diagnosis: Muscle weakness (generalized)  Other lack of coordination    Problem List There are no active problems to display for this patient.   Harrel Carina, MS, OTR/L 07/20/2018, 6:05 PM  [place] MAIN [facility]  SERVICES [address] [place], [place], [address] Phone: [phone]   Fax:  [phone]  Name: SHEMEKIA PATANE MRN: [id number] Date of Birth: 1995-12-01

## 2018-07-27 ENCOUNTER — Encounter: Payer: Self-pay | Admitting: Occupational Therapy

## 2018-07-27 ENCOUNTER — Ambulatory Visit: Payer: Medicaid Other

## 2018-07-27 ENCOUNTER — Ambulatory Visit: Payer: Medicaid Other | Admitting: Occupational Therapy

## 2018-07-27 DIAGNOSIS — R278 Other lack of coordination: Secondary | ICD-10-CM

## 2018-07-27 DIAGNOSIS — M6281 Muscle weakness (generalized): Secondary | ICD-10-CM

## 2018-07-27 DIAGNOSIS — Z9181 History of falling: Secondary | ICD-10-CM

## 2018-07-27 DIAGNOSIS — R262 Difficulty in walking, not elsewhere classified: Secondary | ICD-10-CM

## 2018-07-27 NOTE — Therapy (Signed)
[place] MAIN [facility] SERVICES [address] [place], [place], [address] Phone: [phone]   Fax:  [phone]  Occupational Therapy Treatment  Patient Details  Name: TASCHA CASARES MRN: [id number] Date of Birth: [birth year] No data recorded  Encounter Date: 07/27/2018  OT End of Session - 07/27/18 1621    Visit Number  128    Number of Visits  142    Date for OT Re-Evaluation  10/12/18    Authorization Type  medicaid visit 31 of [place] During Treatment  [employer]    Activity Tolerance  Patient tolerated treatment well    Behavior During Therapy  WFL for tasks assessed/performed       Past Medical History:  Diagnosis Date  . Neurogenic bladder   . Neurogenic bowel   . S/P VP shunt   . Spina bifida [facility])     Past Surgical History:  Procedure Laterality Date  . VENTRICULOPERITONEAL SHUNT      There were no vitals filed for this visit.  Subjective Assessment - 07/27/18 1621    Subjective   Pt. had emergent surgery for a shunt revision over the weekend.    Patient is accompained by:  Family member    Patient Stated Goals  To be as independent as possible at home and at school.       OT TREATMENT    Neuro muscular re-education:  Pt. Worked on [facility] skills grasping 1/8" pegs with long nosed tweezers, and placing them on a small pegboard. Pt. Worked on removing the pegs alternating thumb opposition to the tip of her 2nd digit through thumb. Pt. worked on grasping coins from a tabletop surface, placing them into a resistive container, and pushing them through the slot while isolating his 2nd digit. Pt. worked on moving the coins through her palm. Pt. worked on manipulating extra small beads, and placing them onto a small dowel. Pt. worked on removing the beds alternating thumb opposition to the tip of her 2nd digit through 5th digits. Pt. worked on grasping various earring backings, and placing them on a small dowel  to simulate post earrings.                         OT Education - 07/27/18 1621    Education provided  Yes    Education Details  fine motor coordination, strengthening    Person(s) Educated  Patient    Methods  Explanation;Demonstration    Comprehension  Verbalized understanding;Returned demonstration;Tactile cues required          OT Long Term Goals - 07/20/18 1355      OT LONG TERM GOAL #1   Title  Patient will demonstrate increased strength in right hand sufficient to scoop ice cream from frozen container with modified independence.    Baseline  07/20/2018:  Pt. is able to independently scoop soft ice cream. Requires modA to scoop firm, hard ice cream.    Time  12    Period  Weeks    Status  On-going    Target Date  07/20/18      OT LONG TERM GOAL #2   Title  Patient will be able to make her bed with modified independence.    Baseline  07/20/2018: decreased efficiency with Increased time to complete. Pt. requires help with a heavier comforter.     Time  6    Period  Weeks  Status  On-going    Target Date  07/20/18      OT LONG TERM GOAL #3   Title  Patient will complete back closure bra donning with modified independence.     Baseline  max assist at eval    Time  12    Period  Weeks    Status  Achieved      OT LONG TERM GOAL #4   Title  Patient will increase BUE strength by 1 mm grade to be able to self propel her wheelchair over carpet for distances greater than 300 feet    Baseline  07/20/2018: Before the surgery, pt. was able to go longer distances up to 200 feet but had difficulty with rocky, uneven areas. Pt. now has restrictions following surgery, and is unable to propel her own chair.     Time  12    Period  Weeks    Status  On-going    Target Date  10/12/18      OT LONG TERM GOAL #5   Title  Patient will demonstrate the ability to perform small buttons on shirts independently and with good speed in order to dress for school.     Baseline  goal update: Patient can manipulate buttons however needs assist, and has difficulty if rushed when dressing in the morning     Time  12    Period  Weeks    Status  Revised    Target Date  10/12/18      OT LONG TERM GOAL #6   Title  Patient will donn and doff small earrings with modified independence.    Baseline  07/20/2018: Pt. has difficulty manipulating the earring backings    Time  12    Period  Weeks    Status  Partially Met      OT LONG TERM GOAL #7   Title  Patient will demonstrate cutting vegetables with modified independence with use of adapted equipment and modified techniques    Baseline  07/20/2018: unable to cut hard vegetables such as carrots     Time  8    Period  Weeks    Status  On-going      OT LONG TERM GOAL #8   Title  Patient to complete am self care demonstrating efficient methods of dressing skills to get to school on time.     Baseline  Goal update:  patient reports it takes about an hour to get dressed, running late one time this week.  Still working on efficiency and speed of tasks (07/20/2018:since the surgery pt. is requiring assist secondary to surgical restrictions)    Time  12    Period  Weeks    Status  Achieved      OT LONG TERM GOAL  #9   Baseline  Patient will don/doff socks and AFO braces with modified independence using adaptive equipment as needed.  Socks and braces are more difficult after showering and if socks are new, otherwise she can do it.     Time  12    Period  Weeks    Status  Achieved      OT LONG TERM GOAL  #10   TITLE  Patient will demonstrate the ability to pull jeans down over AFOs with modified independence.     Baseline  07/20/2018:  more difficulty if she wears skinny jeans. (increased assist required secondary to surgical restrictions with bending, twisting, lifting, pulling)    Time  12  Period  Weeks    Status  On-going      OT LONG TERM GOAL  #11   TITLE  Patient will demonstrate moving objects in the  kitchen from one surface to another with modified independence and good safety techniques.     Baseline  07/20/2018: unable to move larger bowls from one place to another.      Time  12    Period  Weeks    Status  On-going      OT LONG TERM GOAL  #12   TITLE  Patient will demonstrate increased grip strength to be able to wring out kitchen cloth sufficiently so water does not drip onto the floor and cause a slip hazard.     Baseline  07/20/18: Pt. has difficulty getting all the water out of cloth    Time  8    Period  Weeks    Status  On-going      OT LONG TERM GOAL  #13   TITLE  Patient will demonstrate ability to use whisk to scramble eggs independently.    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     Baseline  Continued difficulty coordinating the use of both hands to complete task, speed still an issue     Time  6    Period  Months    Status  Achieved      OT LONG TERM GOAL  #15   TITLE  Patient will complete meal preparation with minimal assist.     Baseline  07/20/2018: continues to require moderate assist with meal prep    Time  12    Period  Weeks    Status  On-going            Plan - 07/27/18 1805    Clinical Impression Statement Pt. continues to have the lifting, bending, and twisting precautions following shunt revision surgery. Pt. walked into the therapy gym from the front hospital entrance. Pt. HR readings were 114bpms, 109, 107.  Pt. worked on improving [facility] skills for extra small objects, in preparation for manipulating, and applying earring backings.    Occupational Profile and client history currently impacting functional performance  decreased hand strength, balance and coordination.    Occupational performance deficits  (Please refer to evaluation for details):  ADL's;IADL's;Leisure;Education    Rehab Potential  Good    OT Frequency  1x / week    OT Duration  12 weeks    OT Treatment/Interventions  Self-care/ADL training;DME and/or AE instruction;Therapeutic activities;Therapeutic exercise;Neuromuscular education;Patient/family education    Plan  Occupational Therapy 1 x per week for ADL and strenghtening.    OT Home Exercise Plan  try elastic shoe laces and new tech for bra donning and doffing    Consulted and Agree with Plan of Care  Patient    Family Member Consulted  mom       Patient will benefit from skilled therapeutic intervention in order to improve the following deficits and impairments:  Impaired UE functional use, Difficulty walking, Decreased strength, Decreased coordination, Decreased endurance, Decreased balance  Visit Diagnosis: Other lack of coordination    Problem  List There are no active problems to display for this patient.   Harrel Carina, MS, OTR/L 07/27/2018, 6:12 PM  [place] MAIN [facility] SERVICES [address] [place], [place], [address] Phone: [phone]   Fax:  [phone]  Name: CARLISS PORCARO MRN: [id number] Date of Birth: 07-23-1996

## 2018-07-27 NOTE — Therapy (Signed)
[place] MAIN [facility] SERVICES [address] [place], [place], [address] Phone: [phone]   Fax:  [phone]  Physical Therapy Treatment  Patient Details  Name: Michelle Randall MRN: [id number] Date of Birth: 1996-08-22 No data recorded  Encounter Date: 07/27/2018  PT End of Session - 07/27/18 1543    Visit Number  137    Number of Visits  147    Date for PT Re-Evaluation  09/14/18    Authorization Type  8/12    Authorization Time Period  12 visits 5/29-8/20    PT Start Time  1516    PT Stop Time  1600    PT Time Calculation (min)  44 min    Equipment Utilized During Treatment  Gait belt    Activity Tolerance  Patient tolerated treatment well    Behavior During Therapy  [facility] for tasks assessed/performed       Past Medical History:  Diagnosis Date  . Neurogenic bladder   . Neurogenic bowel   . S/P VP shunt   . Spina bifida [facility])     Past Surgical History:  Procedure Laterality Date  . VENTRICULOPERITONEAL SHUNT      There were no vitals filed for this visit.  Subjective Assessment - 07/27/18 1539    Subjective  Patient presents today still under precautions: including: no HR raise above 130, no bending, lifting, or twisting, and but not limited to no laying supine. Patient has been completing online courses while under precautions and preparing for her upcoming birthday. Reports no nausea, dizziness or pain. Has been compliant with precautions.      Patient is accompained by:  Family member    Limitations  Walking;Standing;House hold activities;Other (comment)    How long can you stand comfortably?  require use of bilateral crutches    How long can you walk comfortably?  dependent upon terrain    Patient Stated Goals  Patient wants to be able to negotiate uneven terrain at school, cross thresholds in home environment, and negotiate steps in household     Currently in Pain?  No/denies       Vitals at start of session:   120/65 pulse 114  Pulse 117 -120 Purse lipped breathing education  Precautions include: no HR above 130, no laying supine, no bending, lifting, twisting  Vitals monitored throughout session: Patient required to be in dark calm quiet environment to keep heart rate in managable range  TherEx seated:  LAQ 10x each leg: (HR 117)  Marches: 12x each leg ; required breaks after every 4 x (HR increased to 127, took rest break until decreased to 111)  Reciprocating opposite arm opposite leg raises: 4x each leg rest break enforced due to HR increase to 129, began again once HR returned to 112 to perform a total of 12 each side.   Adduction isometrics against PT resistance 10x 3 second holds HR maintain range of 113-128.   Abduction isometrics against PT resistance 10x 3 seconds; one leg at a time. Tactile and visual cue used to push against PT hand.  TrA contractions: 10x 3 second holds : (HR range between 109-123). Verbal cues to breathe throughout exercise   Cone reach inside and outside BOS. Reaching left, right, up, and down unsupported. 47mnutes   2lb weighted bar: back away from back of chair to increase postural musculature challenge. Straight arm raises 10x. 2 sets HR monitored between (113-126). Verbal cues to maintain elbow extension  Patient required frequent  rest breaks with purse lipped breathing to reduce HR within range of precautions.                  PT Education - 07/27/18 1542    Education provided  Yes    Education Details  compliance with precautions, continuation of HEP within precaution HR ranges/positional restrictions.     Person(s) Educated  Patient    Methods  Explanation    Comprehension  Verbalized understanding       PT Short Term Goals - 07/21/18 1300      PT SHORT TERM GOAL #1   Title  Patient will report compliance with HEP for continued strengthening and stability during functional mobility.     Baseline  HEP compliant    Time  2     Period  Weeks    Status  Achieved        PT Long Term Goals - 07/21/18 1300      PT LONG TERM GOAL #1   Title  Patient will improve Dynamic Gait Index (DGI) score to > 21/24 for meaningful improvement and low falls risk regarding dynamic walking tasks (revised from > 19/24 )    Baseline  14/24; 11/02/16: 15/24 01/28/17: 18/24 6/5: 19/24; 08/23/17 14/24,  14/24 10/11/17 14/24 12/20/17, 12/27/17/  14/24 9/6 deferred due to precautions    Time  12    Period  Weeks    Status  On-going      PT LONG TERM GOAL #2   Title  Patient will increase Berg Balance score by >51/56 points to be considered a low risk for falls for improved safety. (revised from >6 points improvement)    Baseline  11/02/16: 34/56 01/28/17: 39/56 6/5: 45/56; 08/23/2017 = 45/56, 43/56 10/11/17, 43/56 12/20/17, 12/27/17  43/56 6/20: 38/56 no UE support ; 8/8: 40/56 no UE support 9/6: deferred due to precautions    Time  12    Period  Weeks    Status  On-going      PT LONG TERM GOAL #3   Title  Patient will be able to transfer in and out of a large car with  a high seat with CGA  to improve ability to go to school/doctor visits.     Baseline  Patient requires min A for transfer into large SUV unless it has a step rail on the side; 5/15: pt requires assist with a really high car but can get out of an SUV without assist, typically needs more assits getting in and getting in on the L side; 6/20: if ledge able to do with little to no assistance.  9/6 deferred due to precautions     Time  12    Period  Weeks    Status  Partially Met      PT LONG TERM GOAL #4   Title  Patient will complete a TUG test in < 12 seconds for independent mobility and decreased fall risk     Baseline  11.45; 08/23/2017 = 13.66 sec, 13.40 12/20/17, 12/27/17 12.07 sec; 5/15: 13.42 seconds 6/20: 12 seconds     Time  12    Period  Weeks    Status  Achieved      PT LONG TERM GOAL #5   Title  Patient will improve 6 minute walk distance by > 150 ft for improved return  to functional community activities     Baseline  740 01/28/2017: 7108f 6/5: 795  730 on 07/19/17; 790 ft on  08/23/18,  740 feet, 730 feet 12/20/17, 12/27/17 650 feet 6/20: 665 8/8: 701 9/6: deferred due to precautions    Time  12    Period  Weeks    Status  On-going      PT LONG TERM GOAL #6   Title  Patient will improve gait speed to > 1.2 m/s with least restrictive assistive device to return to normal walking speed     Baseline  .76 m/h 01/28/2017: .87  07/19/17 0.76ms; 0.77 m/s on 08/23/17, . 58 m/sec 10/11/17 .636mec 6/20: 77 m/s  8/8: .7766m9/6: terminated due to precuations for HR    Time  12    Period  Weeks    Status  On-going   03/21/18 target dat     PT LONG TERM GOAL #7   Title  Patient (< 60 85ars old) will complete five times sit to stand test in < 10 seconds indicating an increased LE strength and improved balance     Baseline  9.45 sec; 9.63 on 08/23/17, 9.63 sec 12/27/17, 10.07 sec    Time  12    Period  Weeks    Status  Achieved   03/26/18 target date     [place]   Title  Patient will be able to ambulate on inclines and grass independenlty with LRAD    Baseline  Can ambulate over grassy inclines with decreased speed and safety requiring need loftstrand curtches and SBA; 5/15: pt reports she has to walk slowly and feels unsteady but that this is improving6/20 : reports cautious and slow walk, able to do mod I9/6:  deffered due to precautions    Time  12    Period  Weeks    Status  On-going   03/21/18 target date     PT LONG TERM GOAL  #9   TITLE  Patient will be able to transfer from low chair or stool without UE support independently    Baseline  able to perform independently when not tired, but when fatigued requires support.     Time  12    Period  Weeks    Status  Partially Met   03/21/18 target date     PT LONG TERM GOAL  #10   TITLE  Patient will be able to transfer from the floor to standing independently with use of LRAD    Baseline  difficult on different  types of floor. Slippery floors more challenging and how long on the floor. Only need assistance when not able to put crutch against surface.     Time  12    Period  Weeks    Status  On-going   03/21/18 target date     PT LONG TERM GOAL  #11   TITLE  Patient will step up onto a 6" step 5x with AD independently to increase community mobility    Baseline  6/20: challenging to patient ascending steps, supervision ; 8/8: difficult clearing L foot /14: can step up curb independently     Time  12    Period  Weeks    Status  Achieved   01/03/18 target date           Plan - 07/27/18 1626    Clinical Impression Statement  Patient returns to physical therapy still under precautions secondary to emergency shunt revision <2 weeks prior. Precautions include but are not limited to : keeping HR below 130, no bending, lifting, or twisting, and no  supine positioning.  Session focused on seated exercises with constant HR monitoring due precautions limiting HR >130pbm. Was unable to reassess goals at this time because of above mentioned precautions. Patient has made progress towards goals prior to emergency shunt revision however has not been able to complete more functionally based activities in therapy including sit to stands, ambulation, and stair negotiation since surgery as discussed in last treatment note with further detail. Patient will no longer be under precautions next week and patient will be able to complete standing exercises and activities to further improve strength, ambulation, transfers, and functional mobility to aid in improving quality of life and ability to navigate a college campus. 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     Rehab Potential  Good    Clinical Impairments  Affecting Rehab Potential  weakness and decreased standing balance    PT Frequency  1x / week    PT Duration  12 weeks    PT Treatment/Interventions  Therapeutic exercise;Therapeutic activities;Gait training;Balance training;Stair training;DME Instruction;Neuromuscular re-education;Patient/family education;Manual techniques;Functional mobility training;Passive range of motion    PT Next Visit Plan  Continue POC    PT Home Exercise Plan  added seated dynamic core UE and LE lift offs with visual cues using mirror, supine marches       Patient will benefit from skilled therapeutic intervention in order to improve the following deficits and impairments:  Abnormal gait, Decreased balance, Decreased endurance, Difficulty walking, Decreased strength, Decreased activity tolerance, Decreased coordination, Decreased mobility, Decreased range of motion, Impaired flexibility, Postural dysfunction, Improper body mechanics  Visit Diagnosis: Muscle weakness (generalized)  Other lack of coordination  Difficulty in walking, not elsewhere classified  Personal history of fall     Problem List There are no active problems to display for this patient.  Erick Blinks, SPT This entire session was performed under direct supervision and direction of a licensed therapist/therapist assistant . I have personally read, edited and approve of the note as written. Janna Arch, PT, DPT   07/27/2018, 4:27 PM  [place] MAIN [facility] SERVICES [address] [place], [place], [address] Phone: [phone]   Fax:  [phone]  Name: Michelle Randall MRN: [id number] Date of Birth: 06/26/1996

## 2018-08-02 ENCOUNTER — Encounter: Payer: Medicaid Other | Admitting: Occupational Therapy

## 2018-08-02 ENCOUNTER — Ambulatory Visit: Payer: Medicaid Other

## 2018-08-09 ENCOUNTER — Encounter: Payer: Self-pay | Admitting: Occupational Therapy

## 2018-08-09 ENCOUNTER — Ambulatory Visit: Payer: Medicaid Other | Admitting: Occupational Therapy

## 2018-08-09 ENCOUNTER — Ambulatory Visit: Payer: Medicaid Other

## 2018-08-09 VITALS — BP 110/66 | HR 101

## 2018-08-09 DIAGNOSIS — R262 Difficulty in walking, not elsewhere classified: Secondary | ICD-10-CM

## 2018-08-09 DIAGNOSIS — R278 Other lack of coordination: Secondary | ICD-10-CM

## 2018-08-09 DIAGNOSIS — M6281 Muscle weakness (generalized): Secondary | ICD-10-CM

## 2018-08-09 NOTE — Therapy (Addendum)
[place] [facility] REHAB SERVICES [address] [place], [place], [address] Phone: [phone]   Fax:  [phone]  Physical Therapy Treatment/   Dates of reporting period  08/09/18 to 10/31/18   Patient Details  Name: Michelle Randall MRN: [id number] Date of Birth: 11/11/1996 No data recorded  Encounter Date: 08/09/2018  PT End of Session - 08/09/18 1618    Visit Number  138    Number of Visits  150    Date for PT Re-Evaluation  10/31/18    Authorization Time Period  1/12 visits authorization 9/25-12/17     PT Start Time  1600    PT Stop Time  1644    PT Time Calculation (min)  44 min    Equipment Utilized During Treatment  Gait belt    Activity Tolerance  Patient tolerated treatment well    Behavior During Therapy  WFL for tasks assessed/performed       Past Medical History:  Diagnosis Date  . Neurogenic bladder   . Neurogenic bowel   . S/P VP shunt   . Spina bifida (HCC)     Past Surgical History:  Procedure Laterality Date  . VENTRICULOPERITONEAL SHUNT      Vitals:   08/09/18 1603  BP: 110/66  Pulse: (!) 101    Subjective Assessment - 08/09/18 1602    Subjective  Patient states she is doing great. She reports she got her staples out last Monday and the doctor told her she could return to whatever she was doing prior to surgery.    Patient is accompained by:  Family member    Limitations  Walking;Standing;House hold activities;Other (comment)    How long can you stand comfortably?  require use of bilateral crutches    How long can you walk comfortably?  dependent upon terrain    Patient Stated Goals  Patient wants to be able to negotiate uneven terrain at school, cross thresholds in home environment, and negotiate steps in household     Currently in Pain?  No/denies    Pain Onset  In the past 7 days           Reassessed goals   BERG-42/56 6MWT- 640ft 10mwt- 18sec; 0.55m/s  Bathroom break shortened testing  session  During BERG, patient required CGA intermittently for dynamic balance.  During 6mwt, patient ambulated with forearm crutches and bilateral AFOs. Observed limited muscle activation availability of posterior musculature limiting hip extension at terminal stance and limiting foot clearance from limited hip flexion.   Vitals end of session: BP:115/82mmhg HR: 115bpm     Patient's condition has the potential to improve in response to therapy. Maximum improvement is yet to be obtained. The anticipated improvement is attainable and reasonable in a generally predictable time. Patient reports her energy is improving and is ready to begin therapy again.                PT Education - 08/09/18 1614    Education provided  Yes    Education Details  HEP, goals    Person(s) Educated  Patient    Methods  Explanation;Verbal cues    Comprehension  Verbalized understanding;Returned demonstration       PT Short Term Goals - 08/10/18 0837      PT SHORT TERM GOAL #1   Title  Patient will report compliance with HEP for continued strengthening and stability during functional mobility.     Baseline  HEP compliant    Time    2    Period  Weeks    Status  Achieved        PT Long Term Goals - 08/09/18 1625      PT LONG TERM GOAL #1   Title  Patient will improve Dynamic Gait Index (DGI) score to > 21/24 for meaningful improvement and low falls risk regarding dynamic walking tasks (revised from > 19/24 )    Baseline  14/24; 11/02/16: 15/24 01/28/17: 18/24 6/5: 19/24; 08/23/17 14/24,  14/24 10/11/17 14/24 12/20/17, 12/27/17/  14/24 9/6 deferred due to precautions    Time  12    Period  Weeks    Status  On-going    Target Date  10/31/18      PT LONG TERM GOAL #2   Title  Patient will increase Berg Balance score by >51/56 points to be considered a low risk for falls for improved safety. (revised from >6 points improvement)    Baseline  11/02/16: 34/56 01/28/17: 39/56 6/5: 45/56; 08/23/2017 =  45/56, 43/56 10/11/17, 43/56 12/20/17, 12/27/17  43/56 6/20: 38/56 no UE support ; 8/8: 40/56 no UE support 9/6: deferred due to precautions; 9/25: 42/56    Time  12    Period  Weeks    Status  On-going    Target Date  10/31/18      PT LONG TERM GOAL #3   Title  Patient will be able to transfer in and out of a large car with  a high seat with CGA  to improve ability to go to school/doctor visits.     Baseline  Patient requires min A for transfer into large SUV unless it has a step rail on the side; 5/15: pt requires assist with a really high car but can get out of an SUV without assist, typically needs more assits getting in and getting in on the L side; 6/20: if ledge able to do with little to no assistance.  9/6 deferred due to precautions 9/25: if ledge is able to do with little or not assistance    Time  12    Period  Weeks    Status  Partially Met    Target Date  10/31/18      PT LONG TERM GOAL #4   Title  Patient will complete a TUG test in < 12 seconds for independent mobility and decreased fall risk     Baseline  11.45; 08/23/2017 = 13.66 sec, 13.40 12/20/17, 12/27/17 12.07 sec; 5/15: 13.42 seconds 6/20: 12 seconds     Time  12    Period  Weeks    Status  Achieved      PT LONG TERM GOAL #5   Title  Patient will improve 6 minute walk distance by > 150 ft for improved return to functional community activities     Baseline  740 01/28/2017: 756f 6/5: 795  730 on 07/19/17; 790 ft on 08/23/18,  740 feet, 730 feet 12/20/17, 12/27/17 650 feet 6/20: 665 8/8: 701 9/6: deferred due to precautions 9/25: 6447f   Time  12    Period  Weeks    Status  On-going    Target Date  10/31/18      PT LONG TERM GOAL #6   Title  Patient will improve gait speed to > 1.2 m/s with least restrictive assistive device to return to normal walking speed     Baseline  .76 m/h 01/28/2017: .87  07/19/17 0.691m 0.77 m/s on 08/23/17, . 58 m/sec 10/11/17 .  76msec 6/20: 77 m/s  8/8: .758m 9/6: terminated due to precuations for HR  9/25: 0.5571m   Time  12    Period  Weeks    Status  On-going   03/21/18 target dat     PT LONG TERM GOAL #7   Title  Patient (< 60 66ars old) will complete five times sit to stand test in < 10 seconds indicating an increased LE strength and improved balance     Baseline  9.45 sec; 9.63 on 08/23/17, 9.63 sec 12/27/17, 10.07 sec    Time  12    Period  Weeks    Status  Achieved   03/26/18 target date     [place]   Title  Patient will be able to ambulate on inclines and grass independenlty with LRAD    Baseline  Can ambulate over grassy inclines with decreased speed and safety requiring need loftstrand curtches and SBA; 5/15: pt reports she has to walk slowly and feels unsteady but that this is improving6/20 : reports cautious and slow walk, able to do mod I9/6:  deffered due to precautions9/25: able to do slowly and with caution due to fear of falling, able to do mod I    Time  12    Period  Weeks    Status  On-going   03/21/18 target date     PT LONG TERM GOAL  #9   TITLE  Patient will be able to transfer from low chair or stool without UE support independently    Baseline  able to perform independently when not tired, but when fatigued requires support.     Time  12    Period  Weeks    Status  Partially Met   03/21/18 target date     PT LONG TERM GOAL  #10   TITLE  Patient will be able to transfer from the floor to standing independently with use of LRAD    Baseline  difficult on different types of floor. Slippery floors more challenging and how long on the floor. Only need assistance when not able to put crutch against surface. 9/25: can do mod I if immediately stands back up, requires assistance if is in position for long, more difficulty with harder surfaces    Time  12    Period  Weeks    Status  On-going   03/21/18 target date     PT LONG TERM GOAL  #11   TITLE  Patient will step up onto a 6" step 5x with AD independently to increase community mobility    Baseline   6/20: challenging to patient ascending steps, supervision ; 8/8: difficult clearing L foot /14: can step up curb independently     Time  12    Period  Weeks    Status  Achieved   01/03/18 target date           Plan - 08/09/18 1753    Clinical Impression Statement  Patient is no longer under precautions following emergency shunt revision and goals were reassessed. Patient improved BERG balance score to 42/56 demonstrating improvements in balance. However, limited progress has been made on other goals secondary to emergency shunt surgery (9/2) and patient precautions including no increase in heart rate or lying supine not allowing patient to perform active mobility for approximately 3 weeks. These precautions are no longer in effect and patient can return to prior level of function. Patient's condition has the potential to  improve in response to therapy. Maximum improvement is yet to be obtained. The anticipated improvement is attainable and reasonable in a generally predictable time. Patient will continue to benefit from skilled physical therapy services to increase independent mobility for improved quality of life.    Rehab Potential  Good    Clinical Impairments Affecting Rehab Potential  weakness and decreased standing balance    PT Frequency  1x / week    PT Duration  12 weeks    PT Treatment/Interventions  Therapeutic exercise;Therapeutic activities;Gait training;Balance training;Stair training;DME Instruction;Neuromuscular re-education;Patient/family education;Manual techniques;Functional mobility training;Passive range of motion;Electrical Stimulation;Energy conservation;Orthotic Fit/Training    PT Next Visit Plan  strength, floor transfer, ambulation inclines/grass    PT Home Exercise Plan  added seated dynamic core UE and LE lift offs with visual cues using mirror, supine marches    Consulted and Agree with Plan of Care  Patient       Patient will benefit from skilled therapeutic  intervention in order to improve the following deficits and impairments:  Abnormal gait, Decreased balance, Decreased endurance, Difficulty walking, Decreased strength, Decreased activity tolerance, Decreased coordination, Decreased mobility, Decreased range of motion, Impaired flexibility, Postural dysfunction, Improper body mechanics  Visit Diagnosis: Other lack of coordination  Muscle weakness (generalized)  Difficulty in walking, not elsewhere classified     Problem List There are no active problems to display for this patient.  Erick Blinks, SPT  This entire session was performed under direct supervision and direction of a licensed therapist/therapist assistant . I have personally read, edited and approve of the note as written.   Janna Arch, PT, DPT   08/10/2018, 8:41 AM  [place] MAIN [facility] SERVICES [address] [place], [place], [address] Phone: [phone]   Fax:  [phone]  Name: CHABELI BARSAMIAN MRN: [id number] Date of Birth: 12-04-1995

## 2018-08-09 NOTE — Therapy (Addendum)
[place] MAIN [facility] SERVICES [address] [place], [place], [address] Phone: [phone]   Fax:  [phone]  Occupational Therapy Treatment  Patient Details  Name: Michelle Randall MRN: [id number] Date of Birth: Aug 05, 1996 No data recorded  Encounter Date: 08/09/2018  OT End of Session - 08/09/18 1614    Visit Number  129    Number of Visits  142    Date for OT Re-Evaluation  10/12/18    Authorization Type  medicaid visit 43 of 118    OT Start Time  1515    OT Stop Time  1600    OT Time Calculation (min)  45 min    Activity Tolerance  Patient tolerated treatment well    Behavior During Therapy  WFL for tasks assessed/performed       Past Medical History:  Diagnosis Date  . Neurogenic bladder   . Neurogenic bowel   . S/P VP shunt   . Spina bifida [facility])     Past Surgical History:  Procedure Laterality Date  . VENTRICULOPERITONEAL SHUNT      There were no vitals filed for this visit.  Subjective Assessment - 08/09/18 1610    Subjective   Pt recently had staples removed and reports she is doing well    Patient Stated Goals  To be as independent as possible at home and at school.     Currently in Pain?  No/denies        OT TREATMENT  Therapeutic exercise:  Pt completed grip strengthening exercises including opening clips with various resistances x3 before placing and removing them from a small wooden dowel and connecting and disconnecting beads with resistance. Pt alternated between using 3 pt pinch and lateral pinch for exercises. Pt had minimal difficulty with yellow and red clips, and moderate difficulty with green and blue clips. Black clips were withheld. Pt required increased time and demonstrated increased effort to push resistive beads together.  Neuromuscular re-education:  Pt completed [facility] activities that required her to place and remove small pegs, washers, and collars on a pegboard. Pt demonstrated fair  coordination while working with pegs, washers, and collars. Pt also completed activity the required her to place and remove washers on hooks placed on an elevated, vertically placed whiteboard. Pt used slow and deliberate movements when reaching towards hooks and demonstrated good coordination and precision. Pt completed activity that required her to place small earring backs on an extra small dowel. Pt was instructed to position small dowel at ear level to simulate donning earrings. Pt required increased time and demonstrated decreased precision and coordination when vision was occluded.                 OT Education - 08/09/18 1613    Education provided  Yes    Education Details  fine motor coordination, grip strengthening    Person(s) Educated  Patient    Methods  Explanation;Demonstration    Comprehension  Verbalized understanding;Returned demonstration;Tactile cues required          OT Long Term Goals - 07/20/18 1355      OT LONG TERM GOAL #1   Title  Patient will demonstrate increased strength in right hand sufficient to scoop ice cream from frozen container with modified independence.    Baseline  07/20/2018:  Pt. is able to independently scoop soft ice cream. Requires modA to scoop firm, hard ice cream.    Time  12    Period  Weeks  Status  On-going    Target Date  07/20/18      OT LONG TERM GOAL #2   Title  Patient will be able to make her bed with modified independence.    Baseline  07/20/2018: decreased efficiency with Increased time to complete. Pt. requires help with a heavier comforter.     Time  6    Period  Weeks    Status  On-going    Target Date  07/20/18      OT LONG TERM GOAL #3   Title  Patient will complete back closure bra donning with modified independence.     Baseline  max assist at eval    Time  12    Period  Weeks    Status  Achieved      OT LONG TERM GOAL #4   Title  Patient will increase BUE strength by 1 mm grade to be able to self  propel her wheelchair over carpet for distances greater than 300 feet    Baseline  07/20/2018: Before the surgery, pt. was able to go longer distances up to 200 feet but had difficulty with rocky, uneven areas. Pt. now has restrictions following surgery, and is unable to propel her own chair.     Time  12    Period  Weeks    Status  On-going    Target Date  10/12/18      OT LONG TERM GOAL #5   Title  Patient will demonstrate the ability to perform small buttons on shirts independently and with good speed in order to dress for school.    Baseline  goal update: Patient can manipulate buttons however needs assist, and has difficulty if rushed when dressing in the morning     Time  12    Period  Weeks    Status  Revised    Target Date  10/12/18      OT LONG TERM GOAL #6   Title  Patient will donn and doff small earrings with modified independence.    Baseline  07/20/2018: Pt. has difficulty manipulating the earring backings    Time  12    Period  Weeks    Status  Partially Met      OT LONG TERM GOAL #7   Title  Patient will demonstrate cutting vegetables with modified independence with use of adapted equipment and modified techniques    Baseline  07/20/2018: unable to cut hard vegetables such as carrots     Time  8    Period  Weeks    Status  On-going      OT LONG TERM GOAL #8   Title  Patient to complete am self care demonstrating efficient methods of dressing skills to get to school on time.     Baseline  Goal update:  patient reports it takes about an hour to get dressed, running late one time this week.  Still working on efficiency and speed of tasks (07/20/2018:since the surgery pt. is requiring assist secondary to surgical restrictions)    Time  12    Period  Weeks    Status  Achieved      OT LONG TERM GOAL  #9   Baseline  Patient will don/doff socks and AFO braces with modified independence using adaptive equipment as needed.  Socks and braces are more difficult after showering  and if socks are new, otherwise she can do it.     Time  12  Period  Weeks    Status  Achieved      OT LONG TERM GOAL  #10   TITLE  Patient will demonstrate the ability to pull jeans down over AFOs with modified independence.     Baseline  07/20/2018:  more difficulty if she wears skinny jeans. (increased assist required secondary to surgical restrictions with bending, twisting, lifting, pulling)    Time  12    Period  Weeks    Status  On-going      OT LONG TERM GOAL  #11   TITLE  Patient will demonstrate moving objects in the kitchen from one surface to another with modified independence and good safety techniques.     Baseline  07/20/2018: unable to move larger bowls from one place to another.      Time  12    Period  Weeks    Status  On-going      OT LONG TERM GOAL  #12   TITLE  Patient will demonstrate increased grip strength to be able to wring out kitchen cloth sufficiently so water does not drip onto the floor and cause a slip hazard.     Baseline  07/20/18: Pt. has difficulty getting all the water out of cloth    Time  8    Period  Weeks    Status  On-going      OT LONG TERM GOAL  #13   TITLE  Patient will demonstrate ability to use whisk to scramble eggs independently.    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     Baseline  Continued difficulty coordinating the use of both hands to complete task, speed still an issue     Time  6    Period  Months    Status  Achieved      OT LONG TERM GOAL  #15   TITLE  Patient will complete meal preparation with minimal assist.     Baseline  07/20/2018: continues to require moderate assist with meal prep    Time  12    Period  Weeks    Status  On-going            Plan - 08/09/18 1615     Clinical Impression Statement  Pt recently had staples removed and received a good report from doctor's visit. Pt continues to work on [employer] skills and grip strengthening. Pt reports having difficulty opening containers at home due to decreased grip strength. Pt worked to improve [facility] skills with vision occluded in preparation for donning earring backings.    Occupational Profile and client history currently impacting functional performance  decreased hand strength, balance and coordination.    Occupational performance deficits (Please refer to evaluation for details):  ADL's;IADL's;Leisure;Education    Rehab Potential  Good    OT Frequency  1x / week    OT Duration  12 weeks    OT Treatment/Interventions  Self-care/ADL training;DME and/or AE instruction;Therapeutic activities;Therapeutic exercise;Neuromuscular education;Patient/family education    Plan  Occupational Therapy 1 x per week for ADL and strenghtening.    OT Home Exercise Plan  try elastic shoe laces and new  tech for bra donning and doffing    Consulted and Agree with Plan of Care  Patient       Patient will benefit from skilled therapeutic intervention in order to improve the following deficits and impairments:  Impaired UE functional use, Difficulty walking, Decreased strength, Decreased coordination, Decreased endurance, Decreased balance  Visit Diagnosis: Other lack of coordination  Muscle weakness (generalized)    Problem List There are no active problems to display for this patient.   Oliver Hum, OTS 08/09/2018, 4:20 PM   This entire session was performed under direct supervision and direction of a licensed therapist/therapist assistant . I have personally read, edited and approve of the note as written.  Harrel Carina, MS, OTR/L   [place] MAIN [facility] SERVICES [address] [place], [place], [address] Phone: [phone]   Fax:  [phone]  Name: Michelle Randall MRN: [id number] Date of Birth: 03/29/1996

## 2018-08-10 NOTE — Addendum Note (Signed)
Addended by: Claudie Fisherman on: 08/10/2018 08:49 AM   Modules accepted: Orders

## 2018-08-15 ENCOUNTER — Ambulatory Visit: Payer: Medicaid Other | Attending: Student | Admitting: Occupational Therapy

## 2018-08-15 ENCOUNTER — Encounter: Payer: Self-pay | Admitting: Occupational Therapy

## 2018-08-15 DIAGNOSIS — Z9181 History of falling: Secondary | ICD-10-CM | POA: Diagnosis present

## 2018-08-15 DIAGNOSIS — R278 Other lack of coordination: Secondary | ICD-10-CM

## 2018-08-15 DIAGNOSIS — R460 Very low level of personal hygiene: Secondary | ICD-10-CM | POA: Diagnosis present

## 2018-08-15 DIAGNOSIS — R262 Difficulty in walking, not elsewhere classified: Secondary | ICD-10-CM | POA: Insufficient documentation

## 2018-08-15 DIAGNOSIS — R279 Unspecified lack of coordination: Secondary | ICD-10-CM | POA: Diagnosis present

## 2018-08-15 DIAGNOSIS — M6281 Muscle weakness (generalized): Secondary | ICD-10-CM | POA: Insufficient documentation

## 2018-08-15 DIAGNOSIS — R531 Weakness: Secondary | ICD-10-CM | POA: Insufficient documentation

## 2018-08-15 NOTE — Therapy (Addendum)
[place] MAIN [facility] SERVICES [address] [place], [place], [address] Phone: [phone]   Fax:  [phone]  Occupational Therapy Treatment  Patient Details  Name: Michelle Randall MRN: [id number] Date of Birth: 1996/10/11 No data recorded  Encounter Date: 08/15/2018  OT End of Session - 08/15/18 1725    Visit Number  130    Number of Visits  142    Date for OT Re-Evaluation  10/12/18    Authorization Type  medicaid visit 63 of 31    OT Start Time  1645    OT Stop Time  1730    OT Time Calculation (min)  45 min    Activity Tolerance  Patient tolerated treatment well    Behavior During Therapy  WFL for tasks assessed/performed       Past Medical History:  Diagnosis Date  . Neurogenic bladder   . Neurogenic bowel   . S/P VP shunt   . Spina bifida [facility])     Past Surgical History:  Procedure Laterality Date  . VENTRICULOPERITONEAL SHUNT      There were no vitals filed for this visit.  Subjective Assessment - 08/15/18 1723    Subjective   Pt reports having two exams coming this week and feeling prepared for both.    Patient is accompained by:  --    Patient Stated Goals  To be as independent as possible at home and at school.     Currently in Pain?  No/denies    Pain Score  0-No pain       OT TREATMENT  Self-care:  Pt completed  UB dressing task that required her to button a buttondown from shirt while seated. Pt was able to button shirt with increased time. Pt reported increased hand fatigue as the activity progressed. Pt completed IADL task that required her to wring out wet washcloths. Pt was able to wring out more water using R hand compared to her L hand. Pt reported increased hand fatigue and was not able to wring out as much water as the activity progressed. Pt completed task while standing at the sink and did not require seated rest breaks.  Neuromuscular re-education:  Pt completed [facility] task that required her to  place 1/8" pegs on peg board using needle nose tweezers. Pt used tweezers with R hand and gripped them initially underhanded using the tweezers vertically, and switched her hand position to use the tweezers in a horizontal posiiton. Pt alternated opposing thumb to digits 2-5 to remove pegs using both hands. Pt demonstrated decreased [place] with L hand.  Therapeutic exercise:  Pt. placed and removed green, blue, and black resistive clips on a dowel after squeezing x3. Pt tolerated green and blue clips well and demonstrated moderate difficulty with black clips. Pt reported increased hand fatigue as task progressed and was unable to pinch black clips towards to the end of the task.          OT Education - 08/15/18 1724    Education provided  Yes    Education Details  [place], grip strengthening, UB dressing    Person(s) Educated  Patient    Methods  Explanation;Demonstration    Comprehension  Verbalized understanding;Returned demonstration;Tactile cues required          OT Long Term Goals - 07/20/18 1355      OT LONG TERM GOAL #1   Title  Patient will demonstrate increased strength in right hand sufficient to scoop ice  cream from frozen container with modified independence.    Baseline  07/20/2018:  Pt. is able to independently scoop soft ice cream. Requires modA to scoop firm, hard ice cream.    Time  12    Period  Weeks    Status  On-going    Target Date  07/20/18      OT LONG TERM GOAL #2   Title  Patient will be able to make her bed with modified independence.    Baseline  07/20/2018: decreased efficiency with Increased time to complete. Pt. requires help with a heavier comforter.     Time  6    Period  Weeks    Status  On-going    Target Date  07/20/18      OT LONG TERM GOAL #3   Title  Patient will complete back closure bra donning with modified independence.     Baseline  max assist at eval    Time  12    Period  Weeks    Status  Achieved      OT LONG TERM GOAL #4    Title  Patient will increase BUE strength by 1 mm grade to be able to self propel her wheelchair over carpet for distances greater than 300 feet    Baseline  07/20/2018: Before the surgery, pt. was able to go longer distances up to 200 feet but had difficulty with rocky, uneven areas. Pt. now has restrictions following surgery, and is unable to propel her own chair.     Time  12    Period  Weeks    Status  On-going    Target Date  10/12/18      OT LONG TERM GOAL #5   Title  Patient will demonstrate the ability to perform small buttons on shirts independently and with good speed in order to dress for school.    Baseline  goal update: Patient can manipulate buttons however needs assist, and has difficulty if rushed when dressing in the morning     Time  12    Period  Weeks    Status  Revised    Target Date  10/12/18      OT LONG TERM GOAL #6   Title  Patient will donn and doff small earrings with modified independence.    Baseline  07/20/2018: Pt. has difficulty manipulating the earring backings    Time  12    Period  Weeks    Status  Partially Met      OT LONG TERM GOAL #7   Title  Patient will demonstrate cutting vegetables with modified independence with use of adapted equipment and modified techniques    Baseline  07/20/2018: unable to cut hard vegetables such as carrots     Time  8    Period  Weeks    Status  On-going      OT LONG TERM GOAL #8   Title  Patient to complete am self care demonstrating efficient methods of dressing skills to get to school on time.     Baseline  Goal update:  patient reports it takes about an hour to get dressed, running late one time this week.  Still working on efficiency and speed of tasks (07/20/2018:since the surgery pt. is requiring assist secondary to surgical restrictions)    Time  12    Period  Weeks    Status  Achieved      OT LONG TERM GOAL  #9   Baseline  Patient will don/doff socks and AFO braces with modified independence using adaptive  equipment as needed.  Socks and braces are more difficult after showering and if socks are new, otherwise she can do it.     Time  12    Period  Weeks    Status  Achieved      OT LONG TERM GOAL  #10   TITLE  Patient will demonstrate the ability to pull jeans down over AFOs with modified independence.     Baseline  07/20/2018:  more difficulty if she wears skinny jeans. (increased assist required secondary to surgical restrictions with bending, twisting, lifting, pulling)    Time  12    Period  Weeks    Status  On-going      OT LONG TERM GOAL  #11   TITLE  Patient will demonstrate moving objects in the kitchen from one surface to another with modified independence and good safety techniques.     Baseline  07/20/2018: unable to move larger bowls from one place to another.      Time  12    Period  Weeks    Status  On-going      OT LONG TERM GOAL  #12   TITLE  Patient will demonstrate increased grip strength to be able to wring out kitchen cloth sufficiently so water does not drip onto the floor and cause a slip hazard.     Baseline  07/20/18: Pt. has difficulty getting all the water out of cloth    Time  8    Period  Weeks    Status  On-going      OT LONG TERM GOAL  #13   TITLE  Patient will demonstrate ability to use whisk to scramble eggs independently.    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     Baseline  Continued difficulty coordinating the use of both hands to complete task, speed still an issue     Time  6    Period  Months    Status  Achieved      OT LONG TERM GOAL  #15   TITLE  Patient will complete meal preparation with minimal assist.     Baseline  07/20/2018: continues to require moderate assist with meal prep    Time  12     Period  Weeks    Status  On-going            Plan - 08/15/18 1726    Clinical Impression Statement  Pt continues to work on [facility] skills, grip strengthening, and ADL performance using both hands. Pt reports being able to complete full AM self-care routine but requiring increased time to do so. Pt reports things are going well during classes at [employer], and is preparing for a test.   Occupational Profile and client history currently impacting functional performance  decreased hand strength, balance and coordination.    Occupational performance deficits (Please refer to evaluation for details):  ADL's;IADL's;Leisure;Education    Rehab Potential  Good    OT Frequency  1x / week    OT Duration  12 weeks  OT Treatment/Interventions  Self-care/ADL training;DME and/or AE instruction;Therapeutic activities;Therapeutic exercise;Neuromuscular education;Patient/family education    Plan  Occupational Therapy 1 x per week for ADL and strenghtening.    OT Home Exercise Plan  try elastic shoe laces and new tech for bra donning and doffing    Consulted and Agree with Plan of Care  Patient       Patient will benefit from skilled therapeutic intervention in order to improve the following deficits and impairments:  Impaired UE functional use, Difficulty walking, Decreased strength, Decreased coordination, Decreased endurance, Decreased balance  Visit Diagnosis: Muscle weakness (generalized)  Other lack of coordination    Problem List There are no active problems to display for this patient.   Oliver Hum, OTS 08/15/2018, 5:35 PM   This entire session was performed under direct supervision and direction of a licensed therapist/therapist assistant . I have personally read, edited and approve of the note as written.  Harrel Carina, MS, OTR/L   [place] MAIN [facility] SERVICES [address] [place], [place], [address] Phone: [phone]   Fax:   [phone]  Name: Michelle Randall MRN: [id number] Date of Birth: 04-08-1996

## 2018-08-17 ENCOUNTER — Encounter: Payer: Self-pay | Admitting: Physical Therapy

## 2018-08-17 ENCOUNTER — Ambulatory Visit: Payer: Medicaid Other | Admitting: Physical Therapy

## 2018-08-17 DIAGNOSIS — M6281 Muscle weakness (generalized): Secondary | ICD-10-CM | POA: Diagnosis not present

## 2018-08-17 DIAGNOSIS — R278 Other lack of coordination: Secondary | ICD-10-CM

## 2018-08-17 DIAGNOSIS — R460 Very low level of personal hygiene: Secondary | ICD-10-CM

## 2018-08-17 DIAGNOSIS — Z9181 History of falling: Secondary | ICD-10-CM

## 2018-08-17 DIAGNOSIS — R531 Weakness: Secondary | ICD-10-CM

## 2018-08-17 DIAGNOSIS — R279 Unspecified lack of coordination: Secondary | ICD-10-CM

## 2018-08-17 DIAGNOSIS — R262 Difficulty in walking, not elsewhere classified: Secondary | ICD-10-CM

## 2018-08-17 DIAGNOSIS — Z741 Need for assistance with personal care: Secondary | ICD-10-CM

## 2018-08-17 NOTE — Therapy (Signed)
[place] MAIN [facility] SERVICES [address] [place], [place], [address] Phone: [phone]   Fax:  [phone]  Physical Therapy Treatment  Patient Details  Name: Michelle Randall MRN: [id number] Date of Birth: 02-18-1996 No data recorded  Encounter Date: 08/17/2018  PT End of Session - 08/17/18 1621    Visit Number  139    Number of Visits  150    Date for PT Re-Evaluation  10/31/18    Authorization Time Period  2/12 visits authorization 9/25-12/17     PT Start Time  0415    PT Stop Time  0500    PT Time Calculation (min)  45 min    Equipment Utilized During Treatment  Gait belt    Activity Tolerance  Patient tolerated treatment well    Behavior During Therapy  [facility] for tasks assessed/performed       Past Medical History:  Diagnosis Date  . Neurogenic bladder   . Neurogenic bowel   . S/P VP shunt   . Spina bifida [facility])     Past Surgical History:  Procedure Laterality Date  . VENTRICULOPERITONEAL SHUNT      There were no vitals filed for this visit.  Subjective Assessment - 08/17/18 1619    Subjective  Patient states she is doing great. She is feeling so much better.     Patient is accompained by:  Family member    Limitations  Walking;Standing;House hold activities;Other (comment)    How long can you stand comfortably?  require use of bilateral crutches    How long can you walk comfortably?  dependent upon terrain    Patient Stated Goals  Patient wants to be able to negotiate uneven terrain at school, cross thresholds in home environment, and negotiate steps in household     Currently in Pain?  No/denies    Pain Score  0-No pain    Pain Onset  In the past 7 days        Treatment: UE support with all TM : TM walking with incline 1 , speed 1.0 for 10 mins  Side stepping left level , . 4 miles/ hour x 3 mins Side stepping right , llevel . 3 miles / hour x 2 mins Backwards walking . 3 miles/ hour x 2 mins   Leg press  100 lbs x 20 reps x 3 sets  Seated on stool with wheels and propelling backwards x 100 feet   Patient needs VC for posture correction and correct technique                     PT Education - 08/17/18 1620    Education provided  Yes    Education Details  HEP, goals    Person(s) Educated  Patient    Methods  Explanation    Comprehension  Verbalized understanding;Returned demonstration;Need further instruction       PT Short Term Goals - 08/10/18 0837      PT SHORT TERM GOAL #1   Title  Patient will report compliance with HEP for continued strengthening and stability during functional mobility.     Baseline  HEP compliant    Time  2    Period  Weeks    Status  Achieved        PT Long Term Goals - 08/09/18 1625      PT LONG TERM GOAL #1   Title  Patient will improve Dynamic Gait Index (DGI) score to >  21/24 for meaningful improvement and low falls risk regarding dynamic walking tasks (revised from > 19/24 )    Baseline  14/24; 11/02/16: 15/24 01/28/17: 18/24 6/5: 19/24; 08/23/17 14/24,  14/24 10/11/17 14/24 12/20/17, 12/27/17/  14/24 9/6 deferred due to precautions    Time  12    Period  Weeks    Status  On-going    Target Date  10/31/18      PT LONG TERM GOAL #2   Title  Patient will increase Berg Balance score by >51/56 points to be considered a low risk for falls for improved safety. (revised from >6 points improvement)    Baseline  11/02/16: 34/56 01/28/17: 39/56 6/5: 45/56; 08/23/2017 = 45/56, 43/56 10/11/17, 43/56 12/20/17, 12/27/17  43/56 6/20: 38/56 no UE support ; 8/8: 40/56 no UE support 9/6: deferred due to precautions; 9/25: 42/56    Time  12    Period  Weeks    Status  On-going    Target Date  10/31/18      PT LONG TERM GOAL #3   Title  Patient will be able to transfer in and out of a large car with  a high seat with CGA  to improve ability to go to school/doctor visits.     Baseline  Patient requires min A for transfer into large SUV unless it has a  step rail on the side; 5/15: pt requires assist with a really high car but can get out of an SUV without assist, typically needs more assits getting in and getting in on the L side; 6/20: if ledge able to do with little to no assistance.  9/6 deferred due to precautions 9/25: if ledge is able to do with little or not assistance    Time  12    Period  Weeks    Status  Partially Met    Target Date  10/31/18      PT LONG TERM GOAL #4   Title  Patient will complete a TUG test in < 12 seconds for independent mobility and decreased fall risk     Baseline  11.45; 08/23/2017 = 13.66 sec, 13.40 12/20/17, 12/27/17 12.07 sec; 5/15: 13.42 seconds 6/20: 12 seconds     Time  12    Period  Weeks    Status  Achieved      PT LONG TERM GOAL #5   Title  Patient will improve 6 minute walk distance by > 150 ft for improved return to functional community activities     Baseline  740 01/28/2017: 761f 6/5: 795  730 on 07/19/17; 790 ft on 08/23/18,  740 feet, 730 feet 12/20/17, 12/27/17 650 feet 6/20: 665 8/8: 701 9/6: deferred due to precautions 9/25: 6436f   Time  12    Period  Weeks    Status  On-going    Target Date  10/31/18      PT LONG TERM GOAL #6   Title  Patient will improve gait speed to > 1.2 m/s with least restrictive assistive device to return to normal walking speed     Baseline  .76 m/h 01/28/2017: .87  07/19/17 0.6979m 0.77 m/s on 08/23/17, . 58 m/sec 10/11/17 .31m51m 6/20: 77 m/s  8/8: .2m/54m6: terminated due to precuations for HR 9/25: 0.25m/s62mTime  12    Period  Weeks    Status  On-going   03/21/18 target dat     PT LONG TERM GOAL #7   Title  Patient (< 46 years old) will complete five times sit to stand test in < 10 seconds indicating an increased LE strength and improved balance     Baseline  9.45 sec; 9.63 on 08/23/17, 9.63 sec 12/27/17, 10.07 sec    Time  12    Period  Weeks    Status  Achieved   03/26/18 target date     PT LONG TERM GOAL #8   Title  Patient will be able to ambulate on  inclines and grass independenlty with LRAD    Baseline  Can ambulate over grassy inclines with decreased speed and safety requiring need loftstrand curtches and SBA; 5/15: pt reports she has to walk slowly and feels unsteady but that this is improving6/20 : reports cautious and slow walk, able to do mod I9/6:  deffered due to precautions9/25: able to do slowly and with caution due to fear of falling, able to do mod I    Time  12    Period  Weeks    Status  On-going   03/21/18 target date     PT LONG TERM GOAL  #9   TITLE  Patient will be able to transfer from low chair or stool without UE support independently    Baseline  able to perform independently when not tired, but when fatigued requires support.     Time  12    Period  Weeks    Status  Partially Met   03/21/18 target date     PT LONG TERM GOAL  #10   TITLE  Patient will be able to transfer from the floor to standing independently with use of LRAD    Baseline  difficult on different types of floor. Slippery floors more challenging and how long on the floor. Only need assistance when not able to put crutch against surface. 9/25: can do mod I if immediately stands back up, requires assistance if is in position for long, more difficulty with harder surfaces    Time  12    Period  Weeks    Status  On-going   03/21/18 target date     PT LONG TERM GOAL  #11   TITLE  Patient will step up onto a 6" step 5x with AD independently to increase community mobility    Baseline  6/20: challenging to patient ascending steps, supervision ; 8/8: difficult clearing L foot /14: can step up curb independently     Time  12    Period  Weeks    Status  Achieved   01/03/18 target date           Plan - 08/17/18 1646    Clinical Impression Statement  Patient performs side stepping, backwards walking, and elevated TM walking at . 2, . 3 and . 4 miles per hour for LE  Strengthening.and challenged gait .  She needs UE support and  short rest periods as well  as posture correction cues. She will continue to benefit from skilled PT to impropve strength and mobiity .     Rehab Potential  Good    Clinical Impairments Affecting Rehab Potential  weakness and decreased standing balance    PT Frequency  1x / week    PT Duration  12 weeks    PT Treatment/Interventions  Therapeutic exercise;Therapeutic activities;Gait training;Balance training;Stair training;DME Instruction;Neuromuscular re-education;Patient/family education;Manual techniques;Functional mobility training;Passive range of motion;Electrical Stimulation;Energy conservation;Orthotic Fit/Training    PT Next Visit Plan  strength, floor transfer, ambulation inclines/grass  PT Home Exercise Plan  added seated dynamic core UE and LE lift offs with visual cues using mirror, supine marches    Consulted and Agree with Plan of Care  Patient       Patient will benefit from skilled therapeutic intervention in order to improve the following deficits and impairments:  Abnormal gait, Decreased balance, Decreased endurance, Difficulty walking, Decreased strength, Decreased activity tolerance, Decreased coordination, Decreased mobility, Decreased range of motion, Impaired flexibility, Postural dysfunction, Improper body mechanics  Visit Diagnosis: Muscle weakness (generalized)  Other lack of coordination  Difficulty in walking, not elsewhere classified  Personal history of fall  Lack of coordination  Self-care deficit for dressing and grooming  Weakness generalized     Problem List There are no active problems to display for this patient.   [address] [place] DPT 08/17/2018, 5:03 PM  [place] MAIN [facility] SERVICES [place], [place], [address] Phone: [phone]   Fax:  [phone]  Name: Michelle Randall MRN: [id number] Date of Birth: February 26, 1996

## 2018-08-22 ENCOUNTER — Ambulatory Visit: Payer: Medicaid Other

## 2018-08-23 ENCOUNTER — Encounter: Payer: Self-pay | Admitting: Physical Therapy

## 2018-08-23 ENCOUNTER — Encounter: Payer: Self-pay | Admitting: Occupational Therapy

## 2018-08-23 ENCOUNTER — Ambulatory Visit: Payer: Medicaid Other | Admitting: Physical Therapy

## 2018-08-23 ENCOUNTER — Ambulatory Visit: Payer: Medicaid Other | Admitting: Occupational Therapy

## 2018-08-23 DIAGNOSIS — Z9181 History of falling: Secondary | ICD-10-CM

## 2018-08-23 DIAGNOSIS — R278 Other lack of coordination: Secondary | ICD-10-CM

## 2018-08-23 DIAGNOSIS — M6281 Muscle weakness (generalized): Secondary | ICD-10-CM | POA: Diagnosis not present

## 2018-08-23 DIAGNOSIS — R262 Difficulty in walking, not elsewhere classified: Secondary | ICD-10-CM

## 2018-08-23 NOTE — Therapy (Signed)
[place] MAIN [facility] SERVICES [address] [place], [place], [address] Phone: [phone]   Fax:  [phone]  Physical Therapy Treatment  Patient Details  Name: Michelle Randall MRN: [id number] Date of Birth: [birth year] No data recorded  Encounter Date: 08/23/2018  PT End of Session - 08/23/18 1807    Visit Number  140    Number of Visits  150    Date for PT Re-Evaluation  10/31/18    Authorization Time Period  3/12 visits authorization 9/25-12/17     PT Start Time  1602    PT Stop Time  1644    PT Time Calculation (min)  42 min    Activity Tolerance  Patient tolerated treatment well    Behavior During Therapy  [facility] for tasks assessed/performed       Past Medical History:  Diagnosis Date  . Neurogenic bladder   . Neurogenic bowel   . S/P VP shunt   . Spina bifida [facility])     Past Surgical History:  Procedure Laterality Date  . VENTRICULOPERITONEAL SHUNT      There were no vitals filed for this visit.  Subjective Assessment - 08/23/18 1804    Subjective  Pt states she is doing well, but is frustrated because she has an eye infection that was misdiagnosed. She reports that she now has the right medicine.     Patient is accompained by:  Family member    Limitations  Walking;Standing;House hold activities;Other (comment)    How long can you stand comfortably?  require use of bilateral crutches    How long can you walk comfortably?  dependent upon terrain    Patient Stated Goals  Patient wants to be able to negotiate uneven terrain at school, cross thresholds in home environment, and negotiate steps in household     Currently in Pain?  No/denies      TREATMENT  Therapeutic Exercise: Supine pelvic tilts x10 Supine pelvic lateral tilts x10 Supine pelvic clocks x4 each direction Chest lift/hundreds prep x10 Hundreds (Pilates pulse)  Roll up w/ dowel and tactile assist x10 Supine articulating bridge x10  Supine bridge x10 R  bias SLR B x10 Supine B leg circles (Mod A to position LE in hip flexion) x6 each direction S/L hip abduction x10, B S/L hip extension x10 B Prone hip extension x10 B Quadruped spinal articulation x10 Quadruped bird dog, BUE only x5   Patient educated on exercise technique and importance of core strength for performing transfers and to assist in navigating curbs when in community ambulation.    PT Education - 08/23/18 1806    Education provided  Yes    Education Details  exercise technique, HEP    Person(s) Educated  Patient    Methods  Explanation;Tactile cues    Comprehension  Verbalized understanding;Returned demonstration       PT Short Term Goals - 08/10/18 0837      PT SHORT TERM GOAL #1   Title  Patient will report compliance with HEP for continued strengthening and stability during functional mobility.     Baseline  HEP compliant    Time  2    Period  Weeks    Status  Achieved        PT Long Term Goals - 08/09/18 1625      PT LONG TERM GOAL #1   Title  Patient will improve Dynamic Gait Index (DGI) score to > 21/24 for meaningful improvement and low falls  risk regarding dynamic walking tasks (revised from > 19/24 )    Baseline  14/24; 11/02/16: 15/24 01/28/17: 18/24 6/5: 19/24; 08/23/17 14/24,  14/24 10/11/17 14/24 12/20/17, 12/27/17/  14/24 9/6 deferred due to precautions    Time  12    Period  Weeks    Status  On-going    Target Date  10/31/18      PT LONG TERM GOAL #2   Title  Patient will increase Berg Balance score by >51/56 points to be considered a low risk for falls for improved safety. (revised from >6 points improvement)    Baseline  11/02/16: 34/56 01/28/17: 39/56 6/5: 45/56; 08/23/2017 = 45/56, 43/56 10/11/17, 43/56 12/20/17, 12/27/17  43/56 6/20: 38/56 no UE support ; 8/8: 40/56 no UE support 9/6: deferred due to precautions; 9/25: 42/56    Time  12    Period  Weeks    Status  On-going    Target Date  10/31/18      PT LONG TERM GOAL #3   Title  Patient  will be able to transfer in and out of a large car with  a high seat with CGA  to improve ability to go to school/doctor visits.     Baseline  Patient requires min A for transfer into large SUV unless it has a step rail on the side; 5/15: pt requires assist with a really high car but can get out of an SUV without assist, typically needs more assits getting in and getting in on the L side; 6/20: if ledge able to do with little to no assistance.  9/6 deferred due to precautions 9/25: if ledge is able to do with little or not assistance    Time  12    Period  Weeks    Status  Partially Met    Target Date  10/31/18      PT LONG TERM GOAL #4   Title  Patient will complete a TUG test in < 12 seconds for independent mobility and decreased fall risk     Baseline  11.45; 08/23/2017 = 13.66 sec, 13.40 12/20/17, 12/27/17 12.07 sec; 5/15: 13.42 seconds 6/20: 12 seconds     Time  12    Period  Weeks    Status  Achieved      PT LONG TERM GOAL #5   Title  Patient will improve 6 minute walk distance by > 150 ft for improved return to functional community activities     Baseline  740 01/28/2017: 774f 6/5: 795  730 on 07/19/17; 790 ft on 08/23/18,  740 feet, 730 feet 12/20/17, 12/27/17 650 feet 6/20: 665 8/8: 701 9/6: deferred due to precautions 9/25: 64101f   Time  12    Period  Weeks    Status  On-going    Target Date  10/31/18      PT LONG TERM GOAL #6   Title  Patient will improve gait speed to > 1.2 m/s with least restrictive assistive device to return to normal walking speed     Baseline  .76 m/h 01/28/2017: .87  07/19/17 0.6953m 0.77 m/s on 08/23/17, . 58 m/sec 10/11/17 .40m43m 6/20: 77 m/s  8/8: .97m/59m6: terminated due to precuations for HR 9/25: 0.95m/s53mTime  12    Period  Weeks    Status  On-going   03/21/18 target dat     PT LONG TERM GOAL #7   Title  Patient (< 61 yea63 old) will  complete five times sit to stand test in < 10 seconds indicating an increased LE strength and improved balance      Baseline  9.45 sec; 9.63 on 08/23/17, 9.63 sec 12/27/17, 10.07 sec    Time  12    Period  Weeks    Status  Achieved   03/26/18 target date     PT LONG TERM GOAL #8   Title  Patient will be able to ambulate on inclines and grass independenlty with LRAD    Baseline  Can ambulate over grassy inclines with decreased speed and safety requiring need loftstrand curtches and SBA; 5/15: pt reports she has to walk slowly and feels unsteady but that this is improving6/20 : reports cautious and slow walk, able to do mod I9/6:  deffered due to precautions9/25: able to do slowly and with caution due to fear of falling, able to do mod I    Time  12    Period  Weeks    Status  On-going   03/21/18 target date     PT LONG TERM GOAL  #9   TITLE  Patient will be able to transfer from low chair or stool without UE support independently    Baseline  able to perform independently when not tired, but when fatigued requires support.     Time  12    Period  Weeks    Status  Partially Met   03/21/18 target date     PT LONG TERM GOAL  #10   TITLE  Patient will be able to transfer from the floor to standing independently with use of LRAD    Baseline  difficult on different types of floor. Slippery floors more challenging and how long on the floor. Only need assistance when not able to put crutch against surface. 9/25: can do mod I if immediately stands back up, requires assistance if is in position for long, more difficulty with harder surfaces    Time  12    Period  Weeks    Status  On-going   03/21/18 target date     PT LONG TERM GOAL  #11   TITLE  Patient will step up onto a 6" step 5x with AD independently to increase community mobility    Baseline  6/20: challenging to patient ascending steps, supervision ; 8/8: difficult clearing L foot /14: can step up curb independently     Time  12    Period  Weeks    Status  Achieved   01/03/18 target date           Plan - 08/23/18 1808    Clinical Impression  Statement  Patient presents to clinic with excellent motivation to address core strengthening in order to optimize function and balance during gait. Patient demonstrated good motor control and strength as evidenced by her ability to achieve spinal articulation in supine and quadruped positions and appropriately activate her trunk musculature in order to transfer supine to sit independently during exercises. Patient will benefit from continued skilled therapeutic intervention to address deficits in balance, strength, and mobility as well as improve overall QOL.   Rehab Potential  Good    Clinical Impairments Affecting Rehab Potential  weakness and decreased standing balance    PT Frequency  1x / week    PT Duration  12 weeks    PT Treatment/Interventions  Therapeutic exercise;Therapeutic activities;Gait training;Balance training;Stair training;DME Instruction;Neuromuscular re-education;Patient/family education;Manual techniques;Functional mobility training;Passive range of motion;Electrical Stimulation;Energy conservation;Orthotic Fit/Training  PT Next Visit Plan  strength, floor transfer, ambulation inclines/grass    PT Home Exercise Plan  added seated dynamic core UE and LE lift offs with visual cues using mirror, supine marches    Consulted and Agree with Plan of Care  Patient       Patient will benefit from skilled therapeutic intervention in order to improve the following deficits and impairments:  Abnormal gait, Decreased balance, Decreased endurance, Difficulty walking, Decreased strength, Decreased activity tolerance, Decreased coordination, Decreased mobility, Decreased range of motion, Impaired flexibility, Postural dysfunction, Improper body mechanics  Visit Diagnosis: Muscle weakness (generalized)  Other lack of coordination  Difficulty in walking, not elsewhere classified  Personal history of fall     Problem List There are no active problems to display for this  patient.    Myles Gip PT, DPT [phone] 08/23/2018, 6:15 PM  [place] MAIN [facility] SERVICES [address] [place], [place], [address] Phone: [phone]   Fax:  [phone]  Name: Michelle Randall MRN: [id number] Date of Birth: 26-Sep-1996

## 2018-08-23 NOTE — Therapy (Addendum)
[place] MAIN [facility] SERVICES [address] [place], [place], [address] Phone: [phone]   Fax:  [phone]  Occupational Therapy Treatment  Patient Details  Name: Michelle Randall MRN: [id number] Date of Birth: [birth year] No data recorded  Encounter Date: 08/23/2018  OT End of Session - 08/23/18 1700    Visit Number  131    Number of Visits  142    Date for OT Re-Evaluation  10/12/18    Authorization Type  medicaid visit 100 of 7    OT Start Time  1645    OT Stop Time  1730    OT Time Calculation (min)  45 min    Activity Tolerance  Patient tolerated treatment well    Behavior During Therapy  WFL for tasks assessed/performed       Past Medical History:  Diagnosis Date  . Neurogenic bladder   . Neurogenic bowel   . S/P VP shunt   . Spina bifida [facility])     Past Surgical History:  Procedure Laterality Date  . VENTRICULOPERITONEAL SHUNT      There were no vitals filed for this visit.  Subjective Assessment - 08/23/18 1656    Subjective   Pt reports doing well on her exams this past week and preparing for a weekend with friends.    Patient is accompained by:  Family member    Patient Stated Goals  To be as independent as possible at home and at school.     Currently in Pain?  No/denies    Pain Score  0-No pain       OT TREATMENT  Self-care:  Pt completed [facility] task that required her to manipulate small earring backings and place them on a small dowel to simulate donning and doffing earrings. Pt required increased time to manipulate smallest backings especially with the L hand. Pt did not drop any backings during the task. Pt demonstrated increased exertion when placing smallest backings on dowel as the resistance to slide them on increased.  Therapeutic exercise:  Pt completed bilateral hand exercises including green theraputty exercises for opposition, gross grasp, 3 point pinch, 2 point pinch, lateral pinch, and digit  extension  for 1 set3 of 3 reps each. Pt reported having green theraputty at home but only completing gross grasping and opposition occasionally. Pt completed bilateral hand strengthening exercise that required her to use gray gripper set with the Laverne Klugh spring in the 3rd notch from the top to remove large pegs from a pegbaord. Pt required increased time when completing task with the L hand and frequently dropped pegs when reaching to replace in container. Pt took 2 rest breaks when complete task with the L hand.                    OT Education - 08/23/18 1659    Education provided  Yes    Education Details  grip strengthening, pinch strengthening, and [facility]    Person(s) Educated  Patient    Methods  Explanation;Demonstration    Comprehension  Verbalized understanding;Returned demonstration;Tactile cues required          OT Long Term Goals - 07/20/18 1355      OT LONG TERM GOAL #1   Title  Patient will demonstrate increased strength in right hand sufficient to scoop ice cream from frozen container with modified independence.    Baseline  07/20/2018:  Pt. is able to independently scoop soft ice cream. Requires modA to  scoop firm, hard ice cream.    Time  12    Period  Weeks    Status  On-going    Target Date  07/20/18      OT LONG TERM GOAL #2   Title  Patient will be able to make her bed with modified independence.    Baseline  07/20/2018: decreased efficiency with Increased time to complete. Pt. requires help with a heavier comforter.     Time  6    Period  Weeks    Status  On-going    Target Date  07/20/18      OT LONG TERM GOAL #3   Title  Patient will complete back closure bra donning with modified independence.     Baseline  max assist at eval    Time  12    Period  Weeks    Status  Achieved      OT LONG TERM GOAL #4   Title  Patient will increase BUE strength by 1 mm grade to be able to self propel her wheelchair over carpet for distances greater than 300  feet    Baseline  07/20/2018: Before the surgery, pt. was able to go longer distances up to 200 feet but had difficulty with rocky, uneven areas. Pt. now has restrictions following surgery, and is unable to propel her own chair.     Time  12    Period  Weeks    Status  On-going    Target Date  10/12/18      OT LONG TERM GOAL #5   Title  Patient will demonstrate the ability to perform small buttons on shirts independently and with good speed in order to dress for school.    Baseline  goal update: Patient can manipulate buttons however needs assist, and has difficulty if rushed when dressing in the morning     Time  12    Period  Weeks    Status  Revised    Target Date  10/12/18      OT LONG TERM GOAL #6   Title  Patient will donn and doff small earrings with modified independence.    Baseline  07/20/2018: Pt. has difficulty manipulating the earring backings    Time  12    Period  Weeks    Status  Partially Met      OT LONG TERM GOAL #7   Title  Patient will demonstrate cutting vegetables with modified independence with use of adapted equipment and modified techniques    Baseline  07/20/2018: unable to cut hard vegetables such as carrots     Time  8    Period  Weeks    Status  On-going      OT LONG TERM GOAL #8   Title  Patient to complete am self care demonstrating efficient methods of dressing skills to get to school on time.     Baseline  Goal update:  patient reports it takes about an hour to get dressed, running late one time this week.  Still working on efficiency and speed of tasks (07/20/2018:since the surgery pt. is requiring assist secondary to surgical restrictions)    Time  12    Period  Weeks    Status  Achieved      OT LONG TERM GOAL  #9   Baseline  Patient will don/doff socks and AFO braces with modified independence using adaptive equipment as needed.  Socks and braces are more difficult after showering and  if socks are new, otherwise she can do it.     Time  12     Period  Weeks    Status  Achieved      OT LONG TERM GOAL  #10   TITLE  Patient will demonstrate the ability to pull jeans down over AFOs with modified independence.     Baseline  07/20/2018:  more difficulty if she wears skinny jeans. (increased assist required secondary to surgical restrictions with bending, twisting, lifting, pulling)    Time  12    Period  Weeks    Status  On-going      OT LONG TERM GOAL  #11   TITLE  Patient will demonstrate moving objects in the kitchen from one surface to another with modified independence and good safety techniques.     Baseline  07/20/2018: unable to move larger bowls from one place to another.      Time  12    Period  Weeks    Status  On-going      OT LONG TERM GOAL  #12   TITLE  Patient will demonstrate increased grip strength to be able to wring out kitchen cloth sufficiently so water does not drip onto the floor and cause a slip hazard.     Baseline  07/20/18: Pt. has difficulty getting all the water out of cloth    Time  8    Period  Weeks    Status  On-going      OT LONG TERM GOAL  #13   TITLE  Patient will demonstrate ability to use whisk to scramble eggs independently.    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     Baseline  Continued difficulty coordinating the use of both hands to complete task, speed still an issue     Time  6    Period  Months    Status  Achieved      OT LONG TERM GOAL  #15   TITLE  Patient will complete meal preparation with minimal assist.     Baseline  07/20/2018: continues to require moderate assist with meal prep    Time  12    Period  Weeks    Status  On-going            Plan - 08/23/18 1714    Clinical Impression Statement  Pt continues to work on [facility]  skills and grip strengthening. Pt reports not wearing earrings for a while due to not being able to independently manipulate backings. Pt continues to work to strengthen hands and grip to increase functional independence.     Occupational Profile and client history currently impacting functional performance  decreased hand strength, balance and coordination.    Occupational performance deficits (Please refer to evaluation for details):  ADL's;IADL's;Leisure;Education    Rehab Potential  Good    OT Frequency  1x / week    OT Duration  12 weeks    OT Treatment/Interventions  Self-care/ADL training;DME and/or AE instruction;Therapeutic activities;Therapeutic exercise;Neuromuscular education;Patient/family education    Plan  Occupational Therapy 1 x per week for ADL and strenghtening.    OT  Home Exercise Plan  try elastic shoe laces and new tech for bra donning and doffing    Consulted and Agree with Plan of Care  Patient       Patient will benefit from skilled therapeutic intervention in order to improve the following deficits and impairments:  Impaired UE functional use, Difficulty walking, Decreased strength, Decreased coordination, Decreased endurance, Decreased balance  Visit Diagnosis: Muscle weakness (generalized)  Other lack of coordination    Problem List There are no active problems to display for this patient.   Oliver Hum, OTS 08/23/2018, 5:28 PM   This entire session was performed under direct supervision and direction of a licensed therapist/therapist assistant . I have personally read, edited and approve of the note as written.  Harrel Carina, MS, OTR/L   [place] MAIN [facility] SERVICES [address] [place], [place], [address] Phone: [phone]   Fax:  [phone]  Name: Michelle Randall MRN: [id number] Date of Birth: [birth year]

## 2018-08-30 ENCOUNTER — Encounter: Payer: Self-pay | Admitting: Occupational Therapy

## 2018-08-30 ENCOUNTER — Ambulatory Visit: Payer: Medicaid Other

## 2018-08-30 ENCOUNTER — Ambulatory Visit: Payer: Medicaid Other | Admitting: Occupational Therapy

## 2018-08-30 DIAGNOSIS — R262 Difficulty in walking, not elsewhere classified: Secondary | ICD-10-CM

## 2018-08-30 DIAGNOSIS — R278 Other lack of coordination: Secondary | ICD-10-CM

## 2018-08-30 DIAGNOSIS — M6281 Muscle weakness (generalized): Secondary | ICD-10-CM

## 2018-08-30 NOTE — Therapy (Addendum)
[place] MAIN [facility] SERVICES [address] [place], [place], [address] Phone: [phone]   Fax:  [phone]  Occupational Therapy Treatment  Patient Details  Name: Michelle Randall MRN: [id number] Date of Birth: [birth year] No data recorded  Encounter Date: 08/30/2018  OT End of Session - 08/30/18 1708    Visit Number  132    Number of Visits  142    Date for OT Re-Evaluation  10/12/18    Authorization Type  medicaid visit 101 of 71    OT Start Time  1645    OT Stop Time  1730    OT Time Calculation (min)  45 min    Activity Tolerance  Patient tolerated treatment well    Behavior During Therapy  WFL for tasks assessed/performed       Past Medical History:  Diagnosis Date  . Neurogenic bladder   . Neurogenic bowel   . S/P VP shunt   . Spina bifida [facility])     Past Surgical History:  Procedure Laterality Date  . VENTRICULOPERITONEAL SHUNT      There were no vitals filed for this visit.  Subjective Assessment - 08/30/18 1704    Subjective   Pt reports having a good day so far and enjoying her fall break.    Patient is accompained by:  Family member    Patient Stated Goals  To be as independent as possible at home and at school.     Currently in Pain?  No/denies    Pain Score  0-No pain      OT TREATMENT  Neuro re-education:   Pt completed [facility] task that required her to place extra small tubular pegs on a peg board using her hands. Pt required increased time to complete the task and used very slow and deliberate movements to prevent pegs from falling over. Pt frequently dropped pegs when using her L hand.  Self-care:  Pt. Worked on wringing out a Psychologist, sport and exercise into a container transferring water with Bilateral hands in standing. Pt used R and L hand independently to wring out water for x10 reps each. Pt then used both hands to wring water x10 reps. Pt required x1 standing rest break during the task. Pt was able to displaced more  water when using her R hand.  Therapeutic exercise:   Pt assembled and disassembled small pop beads utilizing a three-point pinch. Pt demonstrated greater pinch strength in her R dominant hand. Pt used the L hand to stabilize the beads while the R hand exerted force to assemble and disassemble the beads. Pt exerted more effort when disassembling beads and dropped x3. Pt reported increased hand fatigue as the task progressed. Pt required x2 rest breaks during the task.               OT Education - 08/30/18 1707    Education provided  Yes    Education Details  grip strengthening, pinch strengthening, and [facility]    Person(s) Educated  Patient    Methods  Explanation;Demonstration    Comprehension  Verbalized understanding;Returned demonstration;Tactile cues required          OT Long Term Goals - 07/20/18 1355      OT LONG TERM GOAL #1   Title  Patient will demonstrate increased strength in right hand sufficient to scoop ice cream from frozen container with modified independence.    Baseline  07/20/2018:  Pt. is able to independently scoop soft ice cream. Requires modA  to scoop firm, hard ice cream.    Time  12    Period  Weeks    Status  On-going    Target Date  07/20/18      OT LONG TERM GOAL #2   Title  Patient will be able to make her bed with modified independence.    Baseline  07/20/2018: decreased efficiency with Increased time to complete. Pt. requires help with a heavier comforter.     Time  6    Period  Weeks    Status  On-going    Target Date  07/20/18      OT LONG TERM GOAL #3   Title  Patient will complete back closure bra donning with modified independence.     Baseline  max assist at eval    Time  12    Period  Weeks    Status  Achieved      OT LONG TERM GOAL #4   Title  Patient will increase BUE strength by 1 mm grade to be able to self propel her wheelchair over carpet for distances greater than 300 feet    Baseline  07/20/2018: Before the surgery, pt.  was able to go longer distances up to 200 feet but had difficulty with rocky, uneven areas. Pt. now has restrictions following surgery, and is unable to propel her own chair.     Time  12    Period  Weeks    Status  On-going    Target Date  10/12/18      OT LONG TERM GOAL #5   Title  Patient will demonstrate the ability to perform small buttons on shirts independently and with good speed in order to dress for school.    Baseline  goal update: Patient can manipulate buttons however needs assist, and has difficulty if rushed when dressing in the morning     Time  12    Period  Weeks    Status  Revised    Target Date  10/12/18      OT LONG TERM GOAL #6   Title  Patient will donn and doff small earrings with modified independence.    Baseline  07/20/2018: Pt. has difficulty manipulating the earring backings    Time  12    Period  Weeks    Status  Partially Met      OT LONG TERM GOAL #7   Title  Patient will demonstrate cutting vegetables with modified independence with use of adapted equipment and modified techniques    Baseline  07/20/2018: unable to cut hard vegetables such as carrots     Time  8    Period  Weeks    Status  On-going      OT LONG TERM GOAL #8   Title  Patient to complete am self care demonstrating efficient methods of dressing skills to get to school on time.     Baseline  Goal update:  patient reports it takes about an hour to get dressed, running late one time this week.  Still working on efficiency and speed of tasks (07/20/2018:since the surgery pt. is requiring assist secondary to surgical restrictions)    Time  12    Period  Weeks    Status  Achieved      OT LONG TERM GOAL  #9   Baseline  Patient will don/doff socks and AFO braces with modified independence using adaptive equipment as needed.  Socks and braces are more difficult after showering  and if socks are new, otherwise she can do it.     Time  12    Period  Weeks    Status  Achieved      OT LONG TERM  GOAL  #10   TITLE  Patient will demonstrate the ability to pull jeans down over AFOs with modified independence.     Baseline  07/20/2018:  more difficulty if she wears skinny jeans. (increased assist required secondary to surgical restrictions with bending, twisting, lifting, pulling)    Time  12    Period  Weeks    Status  On-going      OT LONG TERM GOAL  #11   TITLE  Patient will demonstrate moving objects in the kitchen from one surface to another with modified independence and good safety techniques.     Baseline  07/20/2018: unable to move larger bowls from one place to another.      Time  12    Period  Weeks    Status  On-going      OT LONG TERM GOAL  #12   TITLE  Patient will demonstrate increased grip strength to be able to wring out kitchen cloth sufficiently so water does not drip onto the floor and cause a slip hazard.     Baseline  07/20/18: Pt. has difficulty getting all the water out of cloth    Time  8    Period  Weeks    Status  On-going      OT LONG TERM GOAL  #13   TITLE  Patient will demonstrate ability to use whisk to scramble eggs independently.    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     Baseline  Continued difficulty coordinating the use of both hands to complete task, speed still an issue     Time  6    Period  Months    Status  Achieved      OT LONG TERM GOAL  #15   TITLE  Patient will complete meal preparation with minimal assist.     Baseline  07/20/2018: continues to require moderate assist with meal prep    Time  12    Period  Weeks    Status  On-going            Plan - 08/30/18 1712    Clinical Impression Statement Pt continues to make progress in [facility] skills and hand strengthening. Pt demonstrates  greater hand strength and [place] in her dominant R hand. Pt. fatigues with prolonged activity. Pt reports requiring increased time for fine-motor tasks during her day such as fastening fasteners and manipulating earrings. Increased FMC and hand strengthening is necessary to promote independence during ADLs and IADLs.    Occupational Profile and client history currently impacting functional performance  decreased hand strength, balance and coordination.    Occupational performance deficits (Please refer to evaluation for details):  ADL's;IADL's;Leisure;Education    Rehab Potential  Good    OT Frequency  1x / week    OT Duration  12 weeks    OT Treatment/Interventions  Self-care/ADL training;DME and/or AE instruction;Therapeutic activities;Therapeutic exercise;Neuromuscular education;Patient/family education  Plan  Occupational Therapy 1 x per week for ADL and strenghtening.    OT Home Exercise Plan  try elastic shoe laces and new tech for bra donning and doffing    Consulted and Agree with Plan of Care  Patient       Patient will benefit from skilled therapeutic intervention in order to improve the following deficits and impairments:  Impaired UE functional use, Difficulty walking, Decreased strength, Decreased coordination, Decreased endurance, Decreased balance  Visit Diagnosis: Muscle weakness (generalized)  Other lack of coordination    Problem List There are no active problems to display for this patient.   Oliver Hum, OTS 08/30/2018, 5:40 PM   This entire session was performed under direct supervision and direction of a licensed therapist/therapist assistant . I have personally read, edited and approve of the note as written.  Harrel Carina, MS, OTR/L   [place] MAIN [facility] SERVICES [address] [place], [place], [address] Phone: [phone]   Fax:  [phone]  Name: ANTINETTE KEOUGH MRN: [id number] Date of Birth: 06-12-1996

## 2018-08-30 NOTE — Therapy (Signed)
[place] MAIN [facility] SERVICES [address] [place], [place], [address] Phone: [phone]   Fax:  [phone]  Physical Therapy Treatment  Patient Details  Name: Michelle Randall MRN: [id number] Date of Birth: May 24, 1996 No data recorded  Encounter Date: 08/30/2018  PT End of Session - 08/30/18 1610    Visit Number  141    Number of Visits  150    Date for PT Re-Evaluation  10/31/18    Authorization Time Period  4/12 visits authorization 9/25-12/17     PT Start Time  1605    PT Stop Time  1645    PT Time Calculation (min)  40 min    Activity Tolerance  Patient tolerated treatment well    Behavior During Therapy  [facility] for tasks assessed/performed       Past Medical History:  Diagnosis Date  . Neurogenic bladder   . Neurogenic bowel   . S/P VP shunt   . Spina bifida [facility])     Past Surgical History:  Procedure Laterality Date  . VENTRICULOPERITONEAL SHUNT      There were no vitals filed for this visit.  Subjective Assessment - 08/30/18 1609    Subjective  Pt reports that she is doing well today. Denies pain or headaches. No specific questions or concerns    Patient is accompained by:  Family member    Limitations  Walking;Standing;House hold activities;Other (comment)    How long can you stand comfortably?  require use of bilateral crutches    How long can you walk comfortably?  dependent upon terrain    Patient Stated Goals  Patient wants to be able to negotiate uneven terrain at school, cross thresholds in home environment, and negotiate steps in household     Currently in Pain?  No/denies            TREATMENT  Therapeutic Exercise: Supine pelvic tilts x 10; Supine pelvic lateral tilts x 10; Chest lift/hundreds prep x10 Hundreds (Pilates pulse)  Supine bridge x 10; SLR bilateral x 10 with assist to get to end range; Isometric manually resisted rotation 3s hold x 10 each direction; Manually resisted leg press x  10 bilateral; Supine hip abduction x 10 bilateral with gentle manual resistance; Quadruped cat/cow x 10; Quadruped bird dog, BUE only with dynamic reaching in multiple directions as challenged by therapist; Seated on stool with wheels and propelling forwards and backwards x 30' each;   Pt educated throughout session about proper posture and technique with exercises. Improved exercise technique, movement at target joints, use of target muscles after min to mod verbal, visual, tactile cues.    Patient presents to clinic with excellent motivation. She continues to benefit from supine and quadruped strengthening. Pt encouraged to continue HEP. Patient will benefit from continued skilled therapeutic intervention to address deficits in balance, strength, and mobility as well as improve overall QOL.                   PT Short Term Goals - 08/10/18 4097      PT SHORT TERM GOAL #1   Title  Patient will report compliance with HEP for continued strengthening and stability during functional mobility.     Baseline  HEP compliant    Time  2    Period  Weeks    Status  Achieved        PT Long Term Goals - 08/09/18 1625      PT LONG TERM  GOAL #1   Title  Patient will improve Dynamic Gait Index (DGI) score to > 21/24 for meaningful improvement and low falls risk regarding dynamic walking tasks (revised from > 19/24 )    Baseline  14/24; 11/02/16: 15/24 01/28/17: 18/24 6/5: 19/24; 08/23/17 14/24,  14/24 10/11/17 14/24 12/20/17, 12/27/17/  14/24 9/6 deferred due to precautions    Time  12    Period  Weeks    Status  On-going    Target Date  10/31/18      PT LONG TERM GOAL #2   Title  Patient will increase Berg Balance score by >51/56 points to be considered a low risk for falls for improved safety. (revised from >6 points improvement)    Baseline  11/02/16: 34/56 01/28/17: 39/56 6/5: 45/56; 08/23/2017 = 45/56, 43/56 10/11/17, 43/56 12/20/17, 12/27/17  43/56 6/20: 38/56 no UE support ; 8/8:  40/56 no UE support 9/6: deferred due to precautions; 9/25: 42/56    Time  12    Period  Weeks    Status  On-going    Target Date  10/31/18      PT LONG TERM GOAL #3   Title  Patient will be able to transfer in and out of a large car with  a high seat with CGA  to improve ability to go to school/doctor visits.     Baseline  Patient requires min A for transfer into large SUV unless it has a step rail on the side; 5/15: pt requires assist with a really high car but can get out of an SUV without assist, typically needs more assits getting in and getting in on the L side; 6/20: if ledge able to do with little to no assistance.  9/6 deferred due to precautions 9/25: if ledge is able to do with little or not assistance    Time  12    Period  Weeks    Status  Partially Met    Target Date  10/31/18      PT LONG TERM GOAL #4   Title  Patient will complete a TUG test in < 12 seconds for independent mobility and decreased fall risk     Baseline  11.45; 08/23/2017 = 13.66 sec, 13.40 12/20/17, 12/27/17 12.07 sec; 5/15: 13.42 seconds 6/20: 12 seconds     Time  12    Period  Weeks    Status  Achieved      PT LONG TERM GOAL #5   Title  Patient will improve 6 minute walk distance by > 150 ft for improved return to functional community activities     Baseline  740 01/28/2017: 772f 6/5: 795  730 on 07/19/17; 790 ft on 08/23/18,  740 feet, 730 feet 12/20/17, 12/27/17 650 feet 6/20: 665 8/8: 701 9/6: deferred due to precautions 9/25: 6470f   Time  12    Period  Weeks    Status  On-going    Target Date  10/31/18      PT LONG TERM GOAL #6   Title  Patient will improve gait speed to > 1.2 m/s with least restrictive assistive device to return to normal walking speed     Baseline  .76 m/h 01/28/2017: .87  07/19/17 0.6926m 0.77 m/s on 08/23/17, . 58 m/sec 10/11/17 .33m30m 6/20: 77 m/s  8/8: .66m/74m6: terminated due to precuations for HR 9/25: 0.37m/s99mTime  12    Period  Weeks    Status  On-going  03/21/18 target  dat     PT LONG TERM GOAL #7   Title  Patient (< 66 years old) will complete five times sit to stand test in < 10 seconds indicating an increased LE strength and improved balance     Baseline  9.45 sec; 9.63 on 08/23/17, 9.63 sec 12/27/17, 10.07 sec    Time  12    Period  Weeks    Status  Achieved   03/26/18 target date     PT LONG TERM GOAL #8   Title  Patient will be able to ambulate on inclines and grass independenlty with LRAD    Baseline  Can ambulate over grassy inclines with decreased speed and safety requiring need loftstrand curtches and SBA; 5/15: pt reports she has to walk slowly and feels unsteady but that this is improving6/20 : reports cautious and slow walk, able to do mod I9/6:  deffered due to precautions9/25: able to do slowly and with caution due to fear of falling, able to do mod I    Time  12    Period  Weeks    Status  On-going   03/21/18 target date     PT LONG TERM GOAL  #9   TITLE  Patient will be able to transfer from low chair or stool without UE support independently    Baseline  able to perform independently when not tired, but when fatigued requires support.     Time  12    Period  Weeks    Status  Partially Met   03/21/18 target date     PT LONG TERM GOAL  #10   TITLE  Patient will be able to transfer from the floor to standing independently with use of LRAD    Baseline  difficult on different types of floor. Slippery floors more challenging and how long on the floor. Only need assistance when not able to put crutch against surface. 9/25: can do mod I if immediately stands back up, requires assistance if is in position for long, more difficulty with harder surfaces    Time  12    Period  Weeks    Status  On-going   03/21/18 target date     PT LONG TERM GOAL  #11   TITLE  Patient will step up onto a 6" step 5x with AD independently to increase community mobility    Baseline  6/20: challenging to patient ascending steps, supervision ; 8/8: difficult clearing L  foot /14: can step up curb independently     Time  12    Period  Weeks    Status  Achieved   01/03/18 target date           Plan - 08/30/18 1610    Clinical Impression Statement  Patient presents to clinic with excellent motivation. She continues to benefit from supine and quadruped strengthening. Pt encouraged to continue HEP. Patient will benefit from continued skilled therapeutic intervention to address deficits in balance, strength, and mobility as well as improve overall QOL.    Clinical Presentation  Stable    Clinical Decision Making  Low    Rehab Potential  Good    Clinical Impairments Affecting Rehab Potential  weakness and decreased standing balance    PT Frequency  1x / week    PT Duration  12 weeks    PT Treatment/Interventions  Therapeutic exercise;Therapeutic activities;Gait training;Balance training;Stair training;DME Instruction;Neuromuscular re-education;Patient/family education;Manual techniques;Functional mobility training;Passive range of motion;Electrical Stimulation;Energy conservation;Orthotic Fit/Training  PT Next Visit Plan  strength, floor transfer, ambulation inclines/grass    PT Home Exercise Plan  added seated dynamic core UE and LE lift offs with visual cues using mirror, supine marches    Consulted and Agree with Plan of Care  Patient       Patient will benefit from skilled therapeutic intervention in order to improve the following deficits and impairments:  Abnormal gait, Decreased balance, Decreased endurance, Difficulty walking, Decreased strength, Decreased activity tolerance, Decreased coordination, Decreased mobility, Decreased range of motion, Impaired flexibility, Postural dysfunction, Improper body mechanics  Visit Diagnosis: Muscle weakness (generalized)  Other lack of coordination  Difficulty in walking, not elsewhere classified     Problem List There are no active problems to display for this patient.  Phillips Grout PT, DPT, GCS   Michelle Randall 08/31/2018, 8:13 AM  [place] MAIN [facility] SERVICES [address] [place], [place], [address] Phone: [phone]   Fax:  [phone]  Name: Michelle Randall MRN: [id number] Date of Birth: 17-Mar-1996

## 2018-09-04 ENCOUNTER — Encounter: Payer: Self-pay | Admitting: Occupational Therapy

## 2018-09-04 ENCOUNTER — Ambulatory Visit: Payer: Medicaid Other | Admitting: Occupational Therapy

## 2018-09-04 DIAGNOSIS — M6281 Muscle weakness (generalized): Secondary | ICD-10-CM

## 2018-09-04 DIAGNOSIS — R278 Other lack of coordination: Secondary | ICD-10-CM

## 2018-09-04 NOTE — Therapy (Addendum)
[place] MAIN [facility] SERVICES [address] [place], [place], [address] Phone: [phone]   Fax:  [phone]  Occupational Therapy Treatment  Patient Details  Name: Michelle Randall MRN: [id number] Date of Birth: 16-Mar-1996 No data recorded  Encounter Date: 09/04/2018  OT End of Session - 09/04/18 1658    Visit Number  133    Number of Visits  142    Date for OT Re-Evaluation  10/12/18    Authorization Type  medicaid visit 15 of 9    OT Start Time  1651    OT Stop Time  1730    OT Time Calculation (min)  39 min    Activity Tolerance  Patient tolerated treatment well    Behavior During Therapy  WFL for tasks assessed/performed       Past Medical History:  Diagnosis Date  . Neurogenic bladder   . Neurogenic bowel   . S/P VP shunt   . Spina bifida [facility])     Past Surgical History:  Procedure Laterality Date  . VENTRICULOPERITONEAL SHUNT      There were no vitals filed for this visit.  Subjective Assessment - 09/04/18 1653    Subjective   Pt reports having a good day and preparing to travel to see her sister while on fall break.    Patient is accompained by:  Family member    Patient Stated Goals  To be as independent as possible at home and at school.     Currently in Pain?  No/denies    Pain Score  0-No pain      OT TREATMENT  Neuromuscular re-education:   Pt worked on [facility] skills grasping, and manipulating knots using string of varying sizes. Pt started with thicker rope and worked towards small lace rope. Pt worked on knots of varying degrees of difficulty. Pt required increased time to untie tight knots. Pt completed tasks that required her to place extra small beads on a small dowel. Pt required increased time to complete beading. Pt dropped x3 beads during the task.  Therapeutic exercise:   Pt performed gross gripping with grip strengthener using her right hand. Pt worked on sustaining grip while grasping pegs and  reaching at various heights. Gripper was placed in the 4th resistive slot with the Vearl Aitken resistive spring. Pt required x4 short rest breaks during the task. Pt completed PVC pipe tree task that required her to assemble PVC pieces according to a template. Pt was encouraged to secure pieces firmly to prevent tree from falling. Pt required min A to secure pieces at the top due to decreased grip strength with UE sustained above head. Pt exerted increased effort to disassemble pieces.                   OT Education - 09/04/18 1654    Education Details  grip strengthening and [facility]    Person(s) Educated  Patient    Methods  Explanation;Demonstration    Comprehension  Verbalized understanding;Returned demonstration;Tactile cues required          OT Long Term Goals - 07/20/18 1355      OT LONG TERM GOAL #1   Title  Patient will demonstrate increased strength in right hand sufficient to scoop ice cream from frozen container with modified independence.    Baseline  07/20/2018:  Pt. is able to independently scoop soft ice cream. Requires modA to scoop firm, hard ice cream.    Time  12  Period  Weeks    Status  On-going    Target Date  07/20/18      OT LONG TERM GOAL #2   Title  Patient will be able to make her bed with modified independence.    Baseline  07/20/2018: decreased efficiency with Increased time to complete. Pt. requires help with a heavier comforter.     Time  6    Period  Weeks    Status  On-going    Target Date  07/20/18      OT LONG TERM GOAL #3   Title  Patient will complete back closure bra donning with modified independence.     Baseline  max assist at eval    Time  12    Period  Weeks    Status  Achieved      OT LONG TERM GOAL #4   Title  Patient will increase BUE strength by 1 mm grade to be able to self propel her wheelchair over carpet for distances greater than 300 feet    Baseline  07/20/2018: Before the surgery, pt. was able to go longer distances  up to 200 feet but had difficulty with rocky, uneven areas. Pt. now has restrictions following surgery, and is unable to propel her own chair.     Time  12    Period  Weeks    Status  On-going    Target Date  10/12/18      OT LONG TERM GOAL #5   Title  Patient will demonstrate the ability to perform small buttons on shirts independently and with good speed in order to dress for school.    Baseline  goal update: Patient can manipulate buttons however needs assist, and has difficulty if rushed when dressing in the morning     Time  12    Period  Weeks    Status  Revised    Target Date  10/12/18      OT LONG TERM GOAL #6   Title  Patient will donn and doff small earrings with modified independence.    Baseline  07/20/2018: Pt. has difficulty manipulating the earring backings    Time  12    Period  Weeks    Status  Partially Met      OT LONG TERM GOAL #7   Title  Patient will demonstrate cutting vegetables with modified independence with use of adapted equipment and modified techniques    Baseline  07/20/2018: unable to cut hard vegetables such as carrots     Time  8    Period  Weeks    Status  On-going      OT LONG TERM GOAL #8   Title  Patient to complete am self care demonstrating efficient methods of dressing skills to get to school on time.     Baseline  Goal update:  patient reports it takes about an hour to get dressed, running late one time this week.  Still working on efficiency and speed of tasks (07/20/2018:since the surgery pt. is requiring assist secondary to surgical restrictions)    Time  12    Period  Weeks    Status  Achieved      OT LONG TERM GOAL  #9   Baseline  Patient will don/doff socks and AFO braces with modified independence using adaptive equipment as needed.  Socks and braces are more difficult after showering and if socks are new, otherwise she can do it.     Time  12    Period  Weeks    Status  Achieved      OT LONG TERM GOAL  #10   TITLE  Patient will  demonstrate the ability to pull jeans down over AFOs with modified independence.     Baseline  07/20/2018:  more difficulty if she wears skinny jeans. (increased assist required secondary to surgical restrictions with bending, twisting, lifting, pulling)    Time  12    Period  Weeks    Status  On-going      OT LONG TERM GOAL  #11   TITLE  Patient will demonstrate moving objects in the kitchen from one surface to another with modified independence and good safety techniques.     Baseline  07/20/2018: unable to move larger bowls from one place to another.      Time  12    Period  Weeks    Status  On-going      OT LONG TERM GOAL  #12   TITLE  Patient will demonstrate increased grip strength to be able to wring out kitchen cloth sufficiently so water does not drip onto the floor and cause a slip hazard.     Baseline  07/20/18: Pt. has difficulty getting all the water out of cloth    Time  8    Period  Weeks    Status  On-going      OT LONG TERM GOAL  #13   TITLE  Patient will demonstrate ability to use whisk to scramble eggs independently.    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     Baseline  Continued difficulty coordinating the use of both hands to complete task, speed still an issue     Time  6    Period  Months    Status  Achieved      OT LONG TERM GOAL  #15   TITLE  Patient will complete meal preparation with minimal assist.     Baseline  07/20/2018: continues to require moderate assist with meal prep    Time  12    Period  Weeks    Status  On-going            Plan - 09/04/18 1708    Clinical Impression Statement  Pt continues to make progress in [facility] skills and hand strengthening. Pt requires increased time for tasks that require  increased hand strength including manipulating knots in laces, drawstrings, and jewelry. Increase hand strength and [facility] skills are required to support independence during ADLs and IADLs.    Occupational Profile and client history currently impacting functional performance  decreased hand strength, balance and coordination.    Occupational performance deficits (Please refer to evaluation for details):  ADL's;IADL's;Leisure;Education    Rehab Potential  Good    OT Frequency  1x / week    OT Duration  12 weeks    OT Treatment/Interventions  Self-care/ADL training;DME and/or AE instruction;Therapeutic activities;Therapeutic exercise;Neuromuscular education;Patient/family education    Plan  Occupational Therapy 1 x per week for ADL and strenghtening.    OT Home Exercise Plan  try elastic shoe laces and new tech  for bra donning and doffing    Consulted and Agree with Plan of Care  Patient    Family Member Consulted  mom       Patient will benefit from skilled therapeutic intervention in order to improve the following deficits and impairments:  Impaired UE functional use, Difficulty walking, Decreased strength, Decreased coordination, Decreased endurance, Decreased balance  Visit Diagnosis: Muscle weakness (generalized)  Other lack of coordination    Problem List There are no active problems to display for this patient.   Oliver Hum, OTS 09/04/2018, 5:23 PM   This entire session was performed under direct supervision and direction of a licensed therapist/therapist assistant . I have personally read, edited and approve of the note as written.  Harrel Carina, MS, OTR/L   [place] MAIN [facility] SERVICES [address] [place], [place], [address] Phone: [phone]   Fax:  [phone]  Name: Michelle Randall MRN: [id number] Date of Birth: [birth year]

## 2018-09-05 ENCOUNTER — Ambulatory Visit: Payer: Medicaid Other

## 2018-09-05 DIAGNOSIS — M6281 Muscle weakness (generalized): Secondary | ICD-10-CM

## 2018-09-05 DIAGNOSIS — R262 Difficulty in walking, not elsewhere classified: Secondary | ICD-10-CM

## 2018-09-05 NOTE — Therapy (Signed)
[place] MAIN [facility] SERVICES [address] [place], [place], [address] Phone: [phone]   Fax:  [phone]  Physical Therapy Treatment  Patient Details  Name: Michelle Randall MRN: [id number] Date of Birth: Feb 14, 1996 No data recorded  Encounter Date: 09/05/2018  PT End of Session - 09/05/18 1605    Visit Number  142    Number of Visits  150    Date for PT Re-Evaluation  10/31/18    Authorization Time Period  4/12 visits authorization 9/25-12/17     PT Start Time  1602    PT Stop Time  1645    PT Time Calculation (min)  43 min    Activity Tolerance  Patient tolerated treatment well    Behavior During Therapy  [facility] for tasks assessed/performed       Past Medical History:  Diagnosis Date  . Neurogenic bladder   . Neurogenic bowel   . S/P VP shunt   . Spina bifida [facility])     Past Surgical History:  Procedure Laterality Date  . VENTRICULOPERITONEAL SHUNT      There were no vitals filed for this visit.  Subjective Assessment - 09/05/18 1605    Subjective  Pt reports that she is doing well today. Denies pain. No specific questions or concerns    Patient is accompained by:  Family member    Limitations  Walking;Standing;House hold activities;Other (comment)    How long can you stand comfortably?  require use of bilateral crutches    How long can you walk comfortably?  dependent upon terrain    Patient Stated Goals  Patient wants to be able to negotiate uneven terrain at school, cross thresholds in home environment, and negotiate steps in household     Currently in Pain?  No/denies          TREATMENT  Therapeutic Exercise: Supine bridge x 10; Hooklying marching x 10 bilateral; Supine pelvic tilts x 10; Supine pelvic lateral tilts x 10; Chest lift/hundreds prep x 10 Hundreds (Pilates pulse); SLR bilateral x 10 with assist to get to end range; Isometric manually resisted rotation 3s hold x 10 each direction; Manually  resisted leg press x 10 bilateral; Supine hip abduction x 10 bilateral with gentle manual resistance; Supine resisted isometric abd flexion and lumbar extension with pt holding cane out in front of chest and therapist providing resistance x 10; Quadruped cat/cow x 10; Quadruped bird dog, BUE only with dynamic reaching in multiple directions as challenged by therapist; Prayer stretch 30s hold x 2; Quadruped fire hydrants x 10 bilateral;   Pt educated throughout session about proper posture and technique with exercises. Improved exercise technique, movement at target joints, use of target muscles after min to mod verbal, visual, tactile cues.     Patient continues to present to clinic with excellent motivation. She continues to benefit from supine and quadruped strengthening especially with abdominal stabilization. Pt encouraged to continue HEP. She will benefit from continued skilled therapeutic intervention to address deficits in balance, strength, and mobility as well as improve overall QOL.                       PT Short Term Goals - 08/10/18 5993      PT SHORT TERM GOAL #1   Title  Patient will report compliance with HEP for continued strengthening and stability during functional mobility.     Baseline  HEP compliant    Time  2  Period  Weeks    Status  Achieved        PT Long Term Goals - 08/09/18 1625      PT LONG TERM GOAL #1   Title  Patient will improve Dynamic Gait Index (DGI) score to > 21/24 for meaningful improvement and low falls risk regarding dynamic walking tasks (revised from > 19/24 )    Baseline  14/24; 11/02/16: 15/24 01/28/17: 18/24 6/5: 19/24; 08/23/17 14/24,  14/24 10/11/17 14/24 12/20/17, 12/27/17/  14/24 9/6 deferred due to precautions    Time  12    Period  Weeks    Status  On-going    Target Date  10/31/18      PT LONG TERM GOAL #2   Title  Patient will increase Berg Balance score by >51/56 points to be considered a low risk for  falls for improved safety. (revised from >6 points improvement)    Baseline  11/02/16: 34/56 01/28/17: 39/56 6/5: 45/56; 08/23/2017 = 45/56, 43/56 10/11/17, 43/56 12/20/17, 12/27/17  43/56 6/20: 38/56 no UE support ; 8/8: 40/56 no UE support 9/6: deferred due to precautions; 9/25: 42/56    Time  12    Period  Weeks    Status  On-going    Target Date  10/31/18      PT LONG TERM GOAL #3   Title  Patient will be able to transfer in and out of a large car with  a high seat with CGA  to improve ability to go to school/doctor visits.     Baseline  Patient requires min A for transfer into large SUV unless it has a step rail on the side; 5/15: pt requires assist with a really high car but can get out of an SUV without assist, typically needs more assits getting in and getting in on the L side; 6/20: if ledge able to do with little to no assistance.  9/6 deferred due to precautions 9/25: if ledge is able to do with little or not assistance    Time  12    Period  Weeks    Status  Partially Met    Target Date  10/31/18      PT LONG TERM GOAL #4   Title  Patient will complete a TUG test in < 12 seconds for independent mobility and decreased fall risk     Baseline  11.45; 08/23/2017 = 13.66 sec, 13.40 12/20/17, 12/27/17 12.07 sec; 5/15: 13.42 seconds 6/20: 12 seconds     Time  12    Period  Weeks    Status  Achieved      PT LONG TERM GOAL #5   Title  Patient will improve 6 minute walk distance by > 150 ft for improved return to functional community activities     Baseline  740 01/28/2017: 75f 6/5: 795  730 on 07/19/17; 790 ft on 08/23/18,  740 feet, 730 feet 12/20/17, 12/27/17 650 feet 6/20: 665 8/8: 701 9/6: deferred due to precautions 9/25: 6436f   Time  12    Period  Weeks    Status  On-going    Target Date  10/31/18      PT LONG TERM GOAL #6   Title  Patient will improve gait speed to > 1.2 m/s with least restrictive assistive device to return to normal walking speed     Baseline  .76 m/h 01/28/2017: .87   07/19/17 0.6942m 0.77 m/s on 08/23/17, . 58 m/sec 10/11/17 .74m73m 6/20: 77  m/s  8/8: .40ms 9/6: terminated due to precuations for HR 9/25: 0.532m    Time  12    Period  Weeks    Status  On-going   03/21/18 target dat     PT LONG TERM GOAL #7   Title  Patient (< [age]ears old) will complete five times sit to stand test in < 10 seconds indicating an increased LE strength and improved balance     Baseline  9.45 sec; 9.63 on 08/23/17, 9.63 sec 12/27/17, 10.07 sec    Time  12    Period  Weeks    Status  Achieved   03/26/18 target date     PT [email or web address]   Title  Patient will be able to ambulate on inclines and grass independenlty with LRAD    Baseline  Can ambulate over grassy inclines with decreased speed and safety requiring need loftstrand curtches and SBA; 5/15: pt reports she has to walk slowly and feels unsteady but that this is improving6/20 : reports cautious and slow walk, able to do mod I9/6:  deffered due to precautions9/25: able to do slowly and with caution due to fear of falling, able to do mod I    Time  12    Period  Weeks    Status  On-going   03/21/18 target date     PT LONG TERM GOAL  #9   TITLE  Patient will be able to transfer from low chair or stool without UE support independently    Baseline  able to perform independently when not tired, but when fatigued requires support.     Time  12    Period  Weeks    Status  Partially Met   03/21/18 target date     PT LONG TERM GOAL  #10   TITLE  Patient will be able to transfer from the floor to standing independently with use of LRAD    Baseline  difficult on different types of floor. Slippery floors more challenging and how long on the floor. Only need assistance when not able to put crutch against surface. 9/25: can do mod I if immediately stands back up, requires assistance if is in position for long, more difficulty with harder surfaces    Time  12    Period  Weeks    Status  On-going   03/21/18 target date     PT LONG  TERM GOAL  #11   TITLE  Patient will step up onto a 6" step 5x with AD independently to increase community mobility    Baseline  6/20: challenging to patient ascending steps, supervision ; 8/8: difficult clearing L foot /14: can step up curb independently     Time  12    Period  Weeks    Status  Achieved   01/03/18 target date           Plan - 09/05/18 1606    Clinical Impression Statement  Patient continues to present to clinic with excellent motivation. She continues to benefit from supine and quadruped strengthening especially with abdominal stabilization. Pt encouraged to continue HEP. She will benefit from continued skilled therapeutic intervention to address deficits in balance, strength, and mobility as well as improve overall QOL.    Rehab Potential  Good    Clinical Impairments Affecting Rehab Potential  weakness and decreased standing balance    PT Frequency  1x / week    PT Duration  12 weeks  PT Treatment/Interventions  Therapeutic exercise;Therapeutic activities;Gait training;Balance training;Stair training;DME Instruction;Neuromuscular re-education;Patient/family education;Manual techniques;Functional mobility training;Passive range of motion;Electrical Stimulation;Energy conservation;Orthotic Fit/Training    PT Next Visit Plan  strength, floor transfer, ambulation inclines/grass    PT Home Exercise Plan  added seated dynamic core UE and LE lift offs with visual cues using mirror, supine marches    Consulted and Agree with Plan of Care  Patient       Patient will benefit from skilled therapeutic intervention in order to improve the following deficits and impairments:  Abnormal gait, Decreased balance, Decreased endurance, Difficulty walking, Decreased strength, Decreased activity tolerance, Decreased coordination, Decreased mobility, Decreased range of motion, Impaired flexibility, Postural dysfunction, Improper body mechanics  Visit Diagnosis: Muscle weakness  (generalized)  Difficulty in walking, not elsewhere classified     Problem List There are no active problems to display for this patient.  Phillips Grout PT, DPT, GCS  Huprich,Jason 09/06/2018, 5:37 PM  [place] MAIN [facility] SERVICES [address] [place], [place], [address] Phone: [phone]   Fax:  [phone]  Name: Michelle Randall MRN: [id number] Date of Birth: 1996-10-06

## 2018-09-06 ENCOUNTER — Ambulatory Visit: Payer: Medicaid Other

## 2018-09-06 ENCOUNTER — Encounter: Payer: Medicaid Other | Admitting: Occupational Therapy

## 2018-09-13 ENCOUNTER — Ambulatory Visit: Payer: Medicaid Other

## 2018-09-13 ENCOUNTER — Ambulatory Visit: Payer: Medicaid Other | Admitting: Occupational Therapy

## 2018-09-13 ENCOUNTER — Encounter: Payer: Self-pay | Admitting: Occupational Therapy

## 2018-09-13 DIAGNOSIS — R262 Difficulty in walking, not elsewhere classified: Secondary | ICD-10-CM

## 2018-09-13 DIAGNOSIS — M6281 Muscle weakness (generalized): Secondary | ICD-10-CM

## 2018-09-13 DIAGNOSIS — R278 Other lack of coordination: Secondary | ICD-10-CM

## 2018-09-13 NOTE — Therapy (Addendum)
[place] MAIN [facility] SERVICES [address] [place], [place], [address] Phone: [phone]   Fax:  [phone]  Occupational Therapy Treatment  Patient Details  Name: Michelle Randall MRN: [id number] Date of Birth: 01-May-1996 No data recorded  Encounter Date: 09/13/2018  OT End of Session - 09/13/18 1648    Visit Number  134    Number of Visits  142    Date for OT Re-Evaluation  10/12/18    Authorization Type  medicaid visit 49 of 37    OT Start Time  1645    OT Stop Time  1730    OT Time Calculation (min)  45 min    Activity Tolerance  Patient tolerated treatment well    Behavior During Therapy  WFL for tasks assessed/performed       Past Medical History:  Diagnosis Date  . Neurogenic bladder   . Neurogenic bowel   . S/P VP shunt   . Spina bifida [facility])     Past Surgical History:  Procedure Laterality Date  . VENTRICULOPERITONEAL SHUNT      There were no vitals filed for this visit.  Subjective Assessment - 09/13/18 1645    Subjective   Pt reports having a good day and is excited for this weekend and homecoming festivities.    Patient is accompained by:  Family member    Patient Stated Goals  To be as independent as possible at home and at school.     Currently in Pain?  No/denies    Pain Score  0-No pain       OT TREATMENT     Neuro muscular re-education:   Pt. worked on grasping 2" sticks and pressing them through a resistive mat with her 2nd digit. Pt used tweezers to remove pegs from resistive mat. Pt sustained a grip on tweezers for the duration of the task and did not require rest breaks today. Pt completed [facility] task that required her to carefully to remove push pins from a container and push into a corkboard using a 3 point pinch and securing them by isolating her 2nd digit or thumb. Pt exerted more effort when pressing pins in when board was placed at a vertical angle. Pt had difficulty and required more time when  pushing pins into places on the board that had not been used previously. Pt required x2 short rest breaks during the task.                     OT Education - 09/13/18 1647    Education provided  Yes    Education Details  Hand strengthening, [facility] skills    Person(s) Educated  Patient    Methods  Explanation;Demonstration    Comprehension  Verbalized understanding;Returned demonstration;Tactile cues required          OT Long Term Goals - 07/20/18 1355      OT LONG TERM GOAL #1   Title  Patient will demonstrate increased strength in right hand sufficient to scoop ice cream from frozen container with modified independence.    Baseline  07/20/2018:  Pt. is able to independently scoop soft ice cream. Requires modA to scoop firm, hard ice cream.    Time  12    Period  Weeks    Status  On-going    Target Date  07/20/18      OT LONG TERM GOAL #2   Title  Patient will be able to make her  bed with modified independence.    Baseline  07/20/2018: decreased efficiency with Increased time to complete. Pt. requires help with a heavier comforter.     Time  6    Period  Weeks    Status  On-going    Target Date  07/20/18      OT LONG TERM GOAL #3   Title  Patient will complete back closure bra donning with modified independence.     Baseline  max assist at eval    Time  12    Period  Weeks    Status  Achieved      OT LONG TERM GOAL #4   Title  Patient will increase BUE strength by 1 mm grade to be able to self propel her wheelchair over carpet for distances greater than 300 feet    Baseline  07/20/2018: Before the surgery, pt. was able to go longer distances up to 200 feet but had difficulty with rocky, uneven areas. Pt. now has restrictions following surgery, and is unable to propel her own chair.     Time  12    Period  Weeks    Status  On-going    Target Date  10/12/18      OT LONG TERM GOAL #5   Title  Patient will demonstrate the ability to perform small buttons on  shirts independently and with good speed in order to dress for school.    Baseline  goal update: Patient can manipulate buttons however needs assist, and has difficulty if rushed when dressing in the morning     Time  12    Period  Weeks    Status  Revised    Target Date  10/12/18      OT LONG TERM GOAL #6   Title  Patient will donn and doff small earrings with modified independence.    Baseline  07/20/2018: Pt. has difficulty manipulating the earring backings    Time  12    Period  Weeks    Status  Partially Met      OT LONG TERM GOAL #7   Title  Patient will demonstrate cutting vegetables with modified independence with use of adapted equipment and modified techniques    Baseline  07/20/2018: unable to cut hard vegetables such as carrots     Time  8    Period  Weeks    Status  On-going      OT LONG TERM GOAL #8   Title  Patient to complete am self care demonstrating efficient methods of dressing skills to get to school on time.     Baseline  Goal update:  patient reports it takes about an hour to get dressed, running late one time this week.  Still working on efficiency and speed of tasks (07/20/2018:since the surgery pt. is requiring assist secondary to surgical restrictions)    Time  12    Period  Weeks    Status  Achieved      OT LONG TERM GOAL  #9   Baseline  Patient will don/doff socks and AFO braces with modified independence using adaptive equipment as needed.  Socks and braces are more difficult after showering and if socks are new, otherwise she can do it.     Time  12    Period  Weeks    Status  Achieved      OT LONG TERM GOAL  #10   TITLE  Patient will demonstrate the ability to pull jeans  down over AFOs with modified independence.     Baseline  07/20/2018:  more difficulty if she wears skinny jeans. (increased assist required secondary to surgical restrictions with bending, twisting, lifting, pulling)    Time  12    Period  Weeks    Status  On-going      OT LONG  TERM GOAL  #11   TITLE  Patient will demonstrate moving objects in the kitchen from one surface to another with modified independence and good safety techniques.     Baseline  07/20/2018: unable to move larger bowls from one place to another.      Time  12    Period  Weeks    Status  On-going      OT LONG TERM GOAL  #12   TITLE  Patient will demonstrate increased grip strength to be able to wring out kitchen cloth sufficiently so water does not drip onto the floor and cause a slip hazard.     Baseline  07/20/18: Pt. has difficulty getting all the water out of cloth    Time  8    Period  Weeks    Status  On-going      OT LONG TERM GOAL  #13   TITLE  Patient will demonstrate ability to use whisk to scramble eggs independently.    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     Baseline  Continued difficulty coordinating the use of both hands to complete task, speed still an issue     Time  6    Period  Months    Status  Achieved      OT LONG TERM GOAL  #15   TITLE  Patient will complete meal preparation with minimal assist.     Baseline  07/20/2018: continues to require moderate assist with meal prep    Time  12    Period  Weeks    Status  On-going            Plan - 09/13/18 1650    Clinical Impression Statement  Pt continues to work to improve [facility] skills and hand strengthening. Pt presents with decreased [facility] skills and hand muscle weakness that affects her abilities to manipulate fasteners on her clothes, pull her pants over her braces, and open containers independently. Pt has made improvements with her [facility] skills and hand strength which supports her independence during ADLs and IADLs.    Occupational Profile and client history currently  impacting functional performance  decreased hand strength, balance and coordination.    Occupational performance deficits (Please refer to evaluation for details):  ADL's;IADL's;Leisure;Education    Rehab Potential  Good    OT Frequency  1x / week    OT Duration  12 weeks    OT Treatment/Interventions  Self-care/ADL training;DME and/or AE instruction;Therapeutic activities;Therapeutic exercise;Neuromuscular education;Patient/family education    Plan  Occupational Therapy 1 x per week for ADL and strenghtening.    Consulted and Agree with Plan of Care  Patient    Family Member Consulted  mom       Patient will benefit from skilled therapeutic intervention in order to  improve the following deficits and impairments:  Impaired UE functional use, Difficulty walking, Decreased strength, Decreased coordination, Decreased endurance, Decreased balance  Visit Diagnosis: Muscle weakness (generalized)  Other lack of coordination    Problem List There are no active problems to display for this patient.   Oliver Hum, OTS 09/13/2018, 5:09 PM   This entire session was performed under direct supervision and direction of a licensed therapist/therapist assistant . I have personally read, edited and approve of the note as written.  Harrel Carina, MS, OTR/L   [place] MAIN [facility] SERVICES [address] [place], [place], [address] Phone: [phone]   Fax:  [phone]  Name: Michelle Randall MRN: [id number] Date of Birth: 1996/01/27

## 2018-09-13 NOTE — Therapy (Signed)
[place] MAIN [facility] SERVICES [address] [place], [place], [address] Phone: [phone]   Fax:  [phone]  Physical Therapy Treatment  Patient Details  Name: Michelle Randall MRN: [id number] Date of Birth: June 03, 1996 No data recorded  Encounter Date: 09/13/2018  PT End of Session - 09/13/18 1632    Visit Number  143    Number of Visits  150    Date for PT Re-Evaluation  10/31/18    Authorization Time Period  4/12 visits authorization 9/25-12/17     PT Start Time  1602    PT Stop Time  1645    PT Time Calculation (min)  43 min    Equipment Utilized During Treatment  Gait belt    Activity Tolerance  Patient tolerated treatment well    Behavior During Therapy  [facility] for tasks assessed/performed       Past Medical History:  Diagnosis Date  . Neurogenic bladder   . Neurogenic bowel   . S/P VP shunt   . Spina bifida [facility])     Past Surgical History:  Procedure Laterality Date  . VENTRICULOPERITONEAL SHUNT      There were no vitals filed for this visit.  Subjective Assessment - 09/13/18 1631    Subjective  Pt reports that she is doing well today. Denies pain. No specific questions or concerns    Patient is accompained by:  Family member    Limitations  Walking;Standing;House hold activities;Other (comment)    How long can you stand comfortably?  require use of bilateral crutches    How long can you walk comfortably?  dependent upon terrain    Patient Stated Goals  Patient wants to be able to negotiate uneven terrain at school, cross thresholds in home environment, and negotiate steps in household     Currently in Pain?  No/denies           TREATMENT   Therapeutic Exercise: Supine bridge x 15; SLR x 15 bilateral; Resisted isometric lumbar rotation 3s hold x 10 bilateral; Supinehip abduction x 10 bilateral with gentle manual resistance; Manually resisted leg press x 10 bilateral; Chest lift/hundreds prep x  10 Hundreds (Pilates pulse); Tall kneeling to half kneeling practice, pt unable to complete without heavy assistance so discontinued; Tall kneeling pball roll outs x 10; Tall kneeling balloon pass in varying planes x 4 minutes; Quadruped bird dog, BUE onlywith dynamic reaching in multiple directions as challenged by therapist; Quadruped fire hydrants x 10 bilateral;   Pt educated throughout session about proper posture and technique with exercises. Improved exercise technique, movement at target joints, use of target muscles after min to mod verbal, visual, tactile cues.    Patient continues to present to clinic with excellent motivation. She continues to benefit from supine and quadruped strengthening especially with abdominal stabilization. Worked on hip strengthening today in tall kneeling as well. Pt encouraged to continue HEP.She will benefit from continued skilled therapeutic intervention to address deficits in balance, strength, and mobility as well as improve overall QOL.                      PT Short Term Goals - 08/10/18 6010      PT SHORT TERM GOAL #1   Title  Patient will report compliance with HEP for continued strengthening and stability during functional mobility.     Baseline  HEP compliant    Time  2    Period  Weeks  Status  Achieved        PT Long Term Goals - 08/09/18 1625      PT LONG TERM GOAL #1   Title  Patient will improve Dynamic Gait Index (DGI) score to > 21/24 for meaningful improvement and low falls risk regarding dynamic walking tasks (revised from > 19/24 )    Baseline  14/24; 11/02/16: 15/24 01/28/17: 18/24 6/5: 19/24; 08/23/17 14/24,  14/24 10/11/17 14/24 12/20/17, 12/27/17/  14/24 9/6 deferred due to precautions    Time  12    Period  Weeks    Status  On-going    Target Date  10/31/18      PT LONG TERM GOAL #2   Title  Patient will increase Berg Balance score by >51/56 points to be considered a low risk for falls for  improved safety. (revised from >6 points improvement)    Baseline  11/02/16: 34/56 01/28/17: 39/56 6/5: 45/56; 08/23/2017 = 45/56, 43/56 10/11/17, 43/56 12/20/17, 12/27/17  43/56 6/20: 38/56 no UE support ; 8/8: 40/56 no UE support 9/6: deferred due to precautions; 9/25: 42/56    Time  12    Period  Weeks    Status  On-going    Target Date  10/31/18      PT LONG TERM GOAL #3   Title  Patient will be able to transfer in and out of a large car with  a high seat with CGA  to improve ability to go to school/doctor visits.     Baseline  Patient requires min A for transfer into large SUV unless it has a step rail on the side; 5/15: pt requires assist with a really high car but can get out of an SUV without assist, typically needs more assits getting in and getting in on the L side; 6/20: if ledge able to do with little to no assistance.  9/6 deferred due to precautions 9/25: if ledge is able to do with little or not assistance    Time  12    Period  Weeks    Status  Partially Met    Target Date  10/31/18      PT LONG TERM GOAL #4   Title  Patient will complete a TUG test in < 12 seconds for independent mobility and decreased fall risk     Baseline  11.45; 08/23/2017 = 13.66 sec, 13.40 12/20/17, 12/27/17 12.07 sec; 5/15: 13.42 seconds 6/20: 12 seconds     Time  12    Period  Weeks    Status  Achieved      PT LONG TERM GOAL #5   Title  Patient will improve 6 minute walk distance by > 150 ft for improved return to functional community activities     Baseline  740 01/28/2017: 741f 6/5: 795  730 on 07/19/17; 790 ft on 08/23/18,  740 feet, 730 feet 12/20/17, 12/27/17 650 feet 6/20: 665 8/8: 701 9/6: deferred due to precautions 9/25: 6413f   Time  12    Period  Weeks    Status  On-going    Target Date  10/31/18      PT LONG TERM GOAL #6   Title  Patient will improve gait speed to > 1.2 m/s with least restrictive assistive device to return to normal walking speed     Baseline  .76 m/h 01/28/2017: .87  07/19/17  0.6974m 0.77 m/s on 08/23/17, . 58 m/sec 10/11/17 .61m8m 6/20: 77 m/s  8/8: .41m/72m6: terminated  due to precuations for HR 9/25: 0.45ms    Time  12    Period  Weeks    Status  On-going   03/21/18 target dat     PT LONG TERM GOAL #7   Title  Patient (< 633years old) will complete five times sit to stand test in < 10 seconds indicating an increased LE strength and improved balance     Baseline  9.45 sec; 9.63 on 08/23/17, 9.63 sec 12/27/17, 10.07 sec    Time  12    Period  Weeks    Status  Achieved   03/26/18 target date     PT [place]#8   Title  Patient will be able to ambulate on inclines and grass independenlty with LRAD    Baseline  Can ambulate over grassy inclines with decreased speed and safety requiring need loftstrand curtches and SBA; 5/15: pt reports she has to walk slowly and feels unsteady but that this is improving6/20 : reports cautious and slow walk, able to do mod I9/6:  deffered due to precautions9/25: able to do slowly and with caution due to fear of falling, able to do mod I    Time  12    Period  Weeks    Status  On-going   03/21/18 target date     PT LONG TERM GOAL  #9   TITLE  Patient will be able to transfer from low chair or stool without UE support independently    Baseline  able to perform independently when not tired, but when fatigued requires support.     Time  12    Period  Weeks    Status  Partially Met   03/21/18 target date     PT LONG TERM GOAL  #10   TITLE  Patient will be able to transfer from the floor to standing independently with use of LRAD    Baseline  difficult on different types of floor. Slippery floors more challenging and how long on the floor. Only need assistance when not able to put crutch against surface. 9/25: can do mod I if immediately stands back up, requires assistance if is in position for long, more difficulty with harder surfaces    Time  12    Period  Weeks    Status  On-going   03/21/18 target date     PT LONG TERM  GOAL  #11   TITLE  Patient will step up onto a 6" step 5x with AD independently to increase community mobility    Baseline  6/20: challenging to patient ascending steps, supervision ; 8/8: difficult clearing L foot /14: can step up curb independently     Time  12    Period  Weeks    Status  Achieved   01/03/18 target date           Plan - 09/13/18 1632    Clinical Impression Statement  Patient continues to present to clinic with excellent motivation. She continues to benefit from supine and quadruped strengthening especially with abdominal stabilization. Worked on hip strengthening today in tall kneeling as well. Pt encouraged to continue HEP.She will benefit from continued skilled therapeutic intervention to address deficits in balance, strength, and mobility as well as improve overall QOL.    Rehab Potential  Good    Clinical Impairments Affecting Rehab Potential  weakness and decreased standing balance    PT Frequency  1x / week    PT Duration  12  weeks    PT Treatment/Interventions  Therapeutic exercise;Therapeutic activities;Gait training;Balance training;Stair training;DME Instruction;Neuromuscular re-education;Patient/family education;Manual techniques;Functional mobility training;Passive range of motion;Electrical Stimulation;Energy conservation;Orthotic Fit/Training    PT Next Visit Plan  strength, floor transfer, ambulation inclines/grass    PT Home Exercise Plan  added seated dynamic core UE and LE lift offs with visual cues using mirror, supine marches    Consulted and Agree with Plan of Care  Patient       Patient will benefit from skilled therapeutic intervention in order to improve the following deficits and impairments:  Abnormal gait, Decreased balance, Decreased endurance, Difficulty walking, Decreased strength, Decreased activity tolerance, Decreased coordination, Decreased mobility, Decreased range of motion, Impaired flexibility, Postural dysfunction, Improper body  mechanics  Visit Diagnosis: Muscle weakness (generalized)  Difficulty in walking, not elsewhere classified     Problem List There are no active problems to display for this patient.  Phillips Grout PT, DPT, GCS  Shawanna Zanders 09/13/2018, 4:59 PM  [place] MAIN [facility] SERVICES [address] [place], [place], [address] Phone: [phone]   Fax:  [phone]  Name: REDA CITRON MRN: [id number] Date of Birth: Dec 27, 1995

## 2018-09-20 ENCOUNTER — Encounter: Payer: Self-pay | Admitting: Occupational Therapy

## 2018-09-20 ENCOUNTER — Ambulatory Visit: Payer: Medicaid Other | Admitting: Occupational Therapy

## 2018-09-20 ENCOUNTER — Ambulatory Visit: Payer: Medicaid Other | Attending: Student

## 2018-09-20 DIAGNOSIS — R278 Other lack of coordination: Secondary | ICD-10-CM | POA: Diagnosis present

## 2018-09-20 DIAGNOSIS — R279 Unspecified lack of coordination: Secondary | ICD-10-CM | POA: Diagnosis present

## 2018-09-20 DIAGNOSIS — R262 Difficulty in walking, not elsewhere classified: Secondary | ICD-10-CM | POA: Diagnosis present

## 2018-09-20 DIAGNOSIS — M6281 Muscle weakness (generalized): Secondary | ICD-10-CM

## 2018-09-20 DIAGNOSIS — Z9181 History of falling: Secondary | ICD-10-CM | POA: Insufficient documentation

## 2018-09-20 DIAGNOSIS — R531 Weakness: Secondary | ICD-10-CM | POA: Diagnosis present

## 2018-09-20 NOTE — Therapy (Addendum)
[place] MAIN [facility] SERVICES [address] [place], [place], [address] Phone: [phone]   Fax:  [phone]  Occupational Therapy Treatment  Patient Details  Name: Michelle Randall MRN: [id number] Date of Birth: July 25, 1996 No data recorded  Encounter Date: 09/20/2018  OT End of Session - 09/20/18 1612    Visit Number  135    Number of Visits  142    Date for OT Re-Evaluation  10/12/18    Authorization Type  medicaid visit 104 of 77    OT Start Time  1600    OT Stop Time  1645    OT Time Calculation (min)  45 min    Activity Tolerance  Patient tolerated treatment well    Behavior During Therapy  WFL for tasks assessed/performed       Past Medical History:  Diagnosis Date  . Neurogenic bladder   . Neurogenic bowel   . S/P VP shunt   . Spina bifida [facility])     Past Surgical History:  Procedure Laterality Date  . VENTRICULOPERITONEAL SHUNT      There were no vitals filed for this visit.  Subjective Assessment - 09/20/18 1610    Subjective   Pt reports having a good day and preparing soon for final exams.    Patient is accompained by:  Family member    Patient Stated Goals  To be as independent as possible at home and at school.     Currently in Pain?  No/denies    Pain Score  0-No pain      OT TREATMENT  Neuromuscular re-education:  Pt completed [facility] task that required her to manipulate small earring backings and place them on a small dowel to simulate donning and doffing earrings. Pt required increased time to manipulate smallest backings especially with the L hand. Pt did not drop any backings during the task. Pt demonstrated increased exertion when placing smallest backings on dowel as the resistance to slide them on increased.  Pt. worked on grasping, and manipulating knots using string of varying sizes. Pt started with thicker rope and worked towards small lace rope. Pt worked on knots of varying degrees of difficulty. Pt  required increased time to untie tight knots.  Therapeutic exercise:  Pt. worked on [employer] using [employer]  tools with pink theraputty using the knob turn, cap turn, and key turn attachments to target specific components of movements in the hand. Pt required increased time to turn each attachment in both directions in putty. Pt demonstrated signs of increased exertion including tremors and strain as attachments were placed deeper in putty. Pt was encouraged to use attachments in both directions to strengthen muscle for tightening and loosening containers, locking and unlocking locks, etc.              OT Education - 09/20/18 1611    Education provided  Yes    Education Details  Hand strengthening, [facility] skills    Person(s) Educated  Patient    Methods  Explanation;Demonstration    Comprehension  Verbalized understanding;Returned demonstration;Tactile cues required          OT Long Term Goals - 07/20/18 1355      OT LONG TERM GOAL #1   Title  Patient will demonstrate increased strength in right hand sufficient to scoop ice cream from frozen container with modified independence.    Baseline  07/20/2018:  Pt. is able to independently scoop soft ice cream. Requires modA to scoop  firm, hard ice cream.    Time  12    Period  Weeks    Status  On-going    Target Date  07/20/18      OT LONG TERM GOAL #2   Title  Patient will be able to make her bed with modified independence.    Baseline  07/20/2018: decreased efficiency with Increased time to complete. Pt. requires help with a heavier comforter.     Time  6    Period  Weeks    Status  On-going    Target Date  07/20/18      OT LONG TERM GOAL #3   Title  Patient will complete back closure bra donning with modified independence.     Baseline  max assist at eval    Time  12    Period  Weeks    Status  Achieved      OT LONG TERM GOAL #4   Title  Patient will increase BUE strength by 1 mm grade to be able to self  propel her wheelchair over carpet for distances greater than 300 feet    Baseline  07/20/2018: Before the surgery, pt. was able to go longer distances up to 200 feet but had difficulty with rocky, uneven areas. Pt. now has restrictions following surgery, and is unable to propel her own chair.     Time  12    Period  Weeks    Status  On-going    Target Date  10/12/18      OT LONG TERM GOAL #5   Title  Patient will demonstrate the ability to perform small buttons on shirts independently and with good speed in order to dress for school.    Baseline  goal update: Patient can manipulate buttons however needs assist, and has difficulty if rushed when dressing in the morning     Time  12    Period  Weeks    Status  Revised    Target Date  10/12/18      OT LONG TERM GOAL #6   Title  Patient will donn and doff small earrings with modified independence.    Baseline  07/20/2018: Pt. has difficulty manipulating the earring backings    Time  12    Period  Weeks    Status  Partially Met      OT LONG TERM GOAL #7   Title  Patient will demonstrate cutting vegetables with modified independence with use of adapted equipment and modified techniques    Baseline  07/20/2018: unable to cut hard vegetables such as carrots     Time  8    Period  Weeks    Status  On-going      OT LONG TERM GOAL #8   Title  Patient to complete am self care demonstrating efficient methods of dressing skills to get to school on time.     Baseline  Goal update:  patient reports it takes about an hour to get dressed, running late one time this week.  Still working on efficiency and speed of tasks (07/20/2018:since the surgery pt. is requiring assist secondary to surgical restrictions)    Time  12    Period  Weeks    Status  Achieved      OT LONG TERM GOAL  #9   Baseline  Patient will don/doff socks and AFO braces with modified independence using adaptive equipment as needed.  Socks and braces are more difficult after showering  and  if socks are new, otherwise she can do it.     Time  12    Period  Weeks    Status  Achieved      OT LONG TERM GOAL  #10   TITLE  Patient will demonstrate the ability to pull jeans down over AFOs with modified independence.     Baseline  07/20/2018:  more difficulty if she wears skinny jeans. (increased assist required secondary to surgical restrictions with bending, twisting, lifting, pulling)    Time  12    Period  Weeks    Status  On-going      OT LONG TERM GOAL  #11   TITLE  Patient will demonstrate moving objects in the kitchen from one surface to another with modified independence and good safety techniques.     Baseline  07/20/2018: unable to move larger bowls from one place to another.      Time  12    Period  Weeks    Status  On-going      OT LONG TERM GOAL  #12   TITLE  Patient will demonstrate increased grip strength to be able to wring out kitchen cloth sufficiently so water does not drip onto the floor and cause a slip hazard.     Baseline  07/20/18: Pt. has difficulty getting all the water out of cloth    Time  8    Period  Weeks    Status  On-going      OT LONG TERM GOAL  #13   TITLE  Patient will demonstrate ability to use whisk to scramble eggs independently.    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     Baseline  Continued difficulty coordinating the use of both hands to complete task, speed still an issue     Time  6    Period  Months    Status  Achieved      OT LONG TERM GOAL  #15   TITLE  Patient will complete meal preparation with minimal assist.     Baseline  07/20/2018: continues to require moderate assist with meal prep    Time  12    Period  Weeks    Status  On-going            Plan - 09/20/18 1613     Clinical Impression Statement  Pt continues to work to improve [facility] skills and hand strength. Pt presents with decreased [facility] skills and hand muscle weakness that affects her abilities to complete daily tasks. Pt has made improvements with her [facility] skills as she is able to don and doff earrings with increased time and open previously opened containers. Pt to continue to work to improve [facility] skills and hand strength to promote independence during ADLs and IADLs.    Occupational Profile and client history currently impacting functional performance  decreased hand strength, balance and coordination.    Occupational performance deficits (Please refer to evaluation for details):  ADL's;IADL's;Leisure;Education    Rehab Potential  Good    OT Frequency  1x / week    OT Duration  12 weeks  OT Treatment/Interventions  Self-care/ADL training;DME and/or AE instruction;Therapeutic activities;Therapeutic exercise;Neuromuscular education;Patient/family education    Plan  Occupational Therapy 1 x per week for ADL and strenghtening.    Consulted and Agree with Plan of Care  Patient    Family Member Consulted  mom       Patient will benefit from skilled therapeutic intervention in order to improve the following deficits and impairments:  Impaired UE functional use, Difficulty walking, Decreased strength, Decreased coordination, Decreased endurance, Decreased balance  Visit Diagnosis: Muscle weakness (generalized)  Lack of coordination    Problem List There are no active problems to display for this patient.   Oliver Hum, OTS 09/20/2018, 4:49 PM   This entire session was performed under direct supervision and direction of a licensed therapist/therapist assistant . I have personally read, edited and approve of the note as written.  Harrel Carina, MS, OTR/L   [place] MAIN [facility] SERVICES [address] [place], [place], [address] Phone: [phone]   Fax:   [phone]  Name: Michelle Randall MRN: [id number] Date of Birth: 05-27-1996

## 2018-09-20 NOTE — Therapy (Signed)
[place] MAIN [facility] SERVICES [address] [place], [place], [address] Phone: [phone]   Fax:  [phone]  Physical Therapy Treatment  Patient Details  Name: Michelle Randall MRN: [id number] Date of Birth: 1996/11/11 No data recorded  Encounter Date: 09/20/2018  PT End of Session - 09/20/18 1531    Visit Number  144    Number of Visits  150    Date for PT Re-Evaluation  10/31/18    Authorization Time Period  7/12 visits authorization 9/25-12/17     PT Start Time  1516    PT Stop Time  1600    PT Time Calculation (min)  44 min    Equipment Utilized During Treatment  Gait belt    Activity Tolerance  Patient tolerated treatment well    Behavior During Therapy  [facility] for tasks assessed/performed       Past Medical History:  Diagnosis Date  . Neurogenic bladder   . Neurogenic bowel   . S/P VP shunt   . Spina bifida [facility])     Past Surgical History:  Procedure Laterality Date  . VENTRICULOPERITONEAL SHUNT      There were no vitals filed for this visit.  Subjective Assessment - 09/20/18 1522    Subjective  Pt reports that she is doing well today. Denies pain. No specific questions or concerns.     Patient is accompained by:  Family member    Limitations  Walking;Standing;House hold activities;Other (comment)    How long can you stand comfortably?  require use of bilateral crutches    How long can you walk comfortably?  dependent upon terrain    Patient Stated Goals  Patient wants to be able to negotiate uneven terrain at school, cross thresholds in home environment, and negotiate steps in household     Currently in Pain?  No/denies         TREATMENT   Therapeutic Exercise: Supine bridge x 15; SLR x 15 bilateral with assist; Resisted isometric lumbar rotation 3s hold x 10 bilateral; Supinehip abduction x 10 bilateral with gentle manual resistance; Manually resisted leg press x 10 bilateral; Chest lift/hundreds prep x  10 Hundreds (Pilates pulse); Tall kneeling to half kneeling practice, pt unable to complete without heavy assistance so discontinued; Tall kneeling pball roll outs x 10; Tall kneeling balloon pass in varying planes x 4 minutes; Quadruped bird dog, BUE onlywith dynamic reaching in multiple directions as challenged by therapist; Quadruped fire hydrants x 10 bilateral; Front knee planks 30s hold x 4 with intermittent tactile cues for proper positioning;   Pt educated throughout session about proper posture and technique with exercises. Improved exercise technique, movement at target joints, use of target muscles after min to mod verbal, visual, tactile cues.   Patientcontinues topresent to clinic with excellent motivation. She continues to benefit from supine and quadruped strengtheningespecially with abdominal stabilization. Worked on hip strengthening today in tall kneeling as well. Will update goals in one of the upcoming sessions. Pt encouraged to continue HEP.Shewill benefit from continued skilled therapeutic intervention to address deficits in balance, strength, and mobility as well as improve overall QOL.                         PT Short Term Goals - 08/10/18 2633      PT SHORT TERM GOAL #1   Title  Patient will report compliance with HEP for continued strengthening and stability during functional mobility.  Baseline  HEP compliant    Time  2    Period  Weeks    Status  Achieved        PT Long Term Goals - 08/09/18 1625      PT LONG TERM GOAL #1   Title  Patient will improve Dynamic Gait Index (DGI) score to > 21/24 for meaningful improvement and low falls risk regarding dynamic walking tasks (revised from > 19/24 )    Baseline  14/24; 11/02/16: 15/24 01/28/17: 18/24 6/5: 19/24; 08/23/17 14/24,  14/24 10/11/17 14/24 12/20/17, 12/27/17/  14/24 9/6 deferred due to precautions    Time  12    Period  Weeks    Status  On-going    Target Date  10/31/18       PT LONG TERM GOAL #2   Title  Patient will increase Berg Balance score by >51/56 points to be considered a low risk for falls for improved safety. (revised from >6 points improvement)    Baseline  11/02/16: 34/56 01/28/17: 39/56 6/5: 45/56; 08/23/2017 = 45/56, 43/56 10/11/17, 43/56 12/20/17, 12/27/17  43/56 6/20: 38/56 no UE support ; 8/8: 40/56 no UE support 9/6: deferred due to precautions; 9/25: 42/56    Time  12    Period  Weeks    Status  On-going    Target Date  10/31/18      PT LONG TERM GOAL #3   Title  Patient will be able to transfer in and out of a large car with  a high seat with CGA  to improve ability to go to school/doctor visits.     Baseline  Patient requires min A for transfer into large SUV unless it has a step rail on the side; 5/15: pt requires assist with a really high car but can get out of an SUV without assist, typically needs more assits getting in and getting in on the L side; 6/20: if ledge able to do with little to no assistance.  9/6 deferred due to precautions 9/25: if ledge is able to do with little or not assistance    Time  12    Period  Weeks    Status  Partially Met    Target Date  10/31/18      PT LONG TERM GOAL #4   Title  Patient will complete a TUG test in < 12 seconds for independent mobility and decreased fall risk     Baseline  11.45; 08/23/2017 = 13.66 sec, 13.40 12/20/17, 12/27/17 12.07 sec; 5/15: 13.42 seconds 6/20: 12 seconds     Time  12    Period  Weeks    Status  Achieved      PT LONG TERM GOAL #5   Title  Patient will improve 6 minute walk distance by > 150 ft for improved return to functional community activities     Baseline  740 01/28/2017: 763f 6/5: 795  730 on 07/19/17; 790 ft on 08/23/18,  740 feet, 730 feet 12/20/17, 12/27/17 650 feet 6/20: 665 8/8: 701 9/6: deferred due to precautions 9/25: 6483f   Time  12    Period  Weeks    Status  On-going    Target Date  10/31/18      PT LONG TERM GOAL #6   Title  Patient will improve gait speed  to > 1.2 m/s with least restrictive assistive device to return to normal walking speed     Baseline  .76 m/h 01/28/2017: .87  07/19/17 0.38ms; 0.77 m/s on 08/23/17, . 58 m/sec 10/11/17 .657mec 6/20: 77 m/s  8/8: .7761m9/6: terminated due to precuations for HR 9/25: 0.4m25m  Time  12    Period  Weeks    Status  On-going   03/21/18 target dat     PT LONG TERM GOAL #7   Title  Patient (< 60 y31rs old) will complete five times sit to stand test in < 10 seconds indicating an increased LE strength and improved balance     Baseline  9.45 sec; 9.63 on 08/23/17, 9.63 sec 12/27/17, 10.07 sec    Time  12    Period  Weeks    Status  Achieved   03/26/18 target date     PT [place]  Title  Patient will be able to ambulate on inclines and grass independenlty with LRAD    Baseline  Can ambulate over grassy inclines with decreased speed and safety requiring need loftstrand curtches and SBA; 5/15: pt reports she has to walk slowly and feels unsteady but that this is improving6/20 : reports cautious and slow walk, able to do mod I9/6:  deffered due to precautions9/25: able to do slowly and with caution due to fear of falling, able to do mod I    Time  12    Period  Weeks    Status  On-going   03/21/18 target date     PT LONG TERM GOAL  #9   TITLE  Patient will be able to transfer from low chair or stool without UE support independently    Baseline  able to perform independently when not tired, but when fatigued requires support.     Time  12    Period  Weeks    Status  Partially Met   03/21/18 target date     PT LONG TERM GOAL  #10   TITLE  Patient will be able to transfer from the floor to standing independently with use of LRAD    Baseline  difficult on different types of floor. Slippery floors more challenging and how long on the floor. Only need assistance when not able to put crutch against surface. 9/25: can do mod I if immediately stands back up, requires assistance if is in position for long,  more difficulty with harder surfaces    Time  12    Period  Weeks    Status  On-going   03/21/18 target date     PT LONG TERM GOAL  #11   TITLE  Patient will step up onto a 6" step 5x with AD independently to increase community mobility    Baseline  6/20: challenging to patient ascending steps, supervision ; 8/8: difficult clearing L foot /14: can step up curb independently     Time  12    Period  Weeks    Status  Achieved   01/03/18 target date           Plan - 09/20/18 1532    Clinical Impression Statement  Patientcontinues topresent to clinic with excellent motivation. She continues to benefit from supine and quadruped strengtheningespecially with abdominal stabilization. Worked on hip strengthening today in tall kneeling as well. Will update goals in one of the upcoming sessions. Pt encouraged to continue HEP.Shewill benefit from continued skilled therapeutic intervention to address deficits in balance, strength, and mobility as well as improve overall QOL.    Rehab Potential  Good    Clinical Impairments Affecting  Rehab Potential  weakness and decreased standing balance    PT Frequency  1x / week    PT Duration  12 weeks    PT Treatment/Interventions  Therapeutic exercise;Therapeutic activities;Gait training;Balance training;Stair training;DME Instruction;Neuromuscular re-education;Patient/family education;Manual techniques;Functional mobility training;Passive range of motion;Electrical Stimulation;Energy conservation;Orthotic Fit/Training    PT Next Visit Plan  strength, floor transfer, ambulation inclines/grass    PT Home Exercise Plan  added seated dynamic core UE and LE lift offs with visual cues using mirror, supine marches    Consulted and Agree with Plan of Care  Patient       Patient will benefit from skilled therapeutic intervention in order to improve the following deficits and impairments:  Abnormal gait, Decreased balance, Decreased endurance, Difficulty walking,  Decreased strength, Decreased activity tolerance, Decreased coordination, Decreased mobility, Decreased range of motion, Impaired flexibility, Postural dysfunction, Improper body mechanics  Visit Diagnosis: Muscle weakness (generalized)  Difficulty in walking, not elsewhere classified     Problem List There are no active problems to display for this patient.  Phillips Grout PT, DPT, GCS  Michelle Randall 09/20/2018, 4:08 PM  [place] MAIN [facility] SERVICES [address] [place], [place], [address] Phone: [phone]   Fax:  [phone]  Name: Michelle Randall MRN: [id number] Date of Birth: [birth year]

## 2018-09-27 ENCOUNTER — Encounter: Payer: Self-pay | Admitting: Occupational Therapy

## 2018-09-27 ENCOUNTER — Ambulatory Visit: Payer: Medicaid Other | Admitting: Occupational Therapy

## 2018-09-27 ENCOUNTER — Ambulatory Visit: Payer: Medicaid Other

## 2018-09-27 DIAGNOSIS — M6281 Muscle weakness (generalized): Secondary | ICD-10-CM

## 2018-09-27 DIAGNOSIS — R278 Other lack of coordination: Secondary | ICD-10-CM

## 2018-09-27 DIAGNOSIS — R262 Difficulty in walking, not elsewhere classified: Secondary | ICD-10-CM

## 2018-09-27 DIAGNOSIS — R279 Unspecified lack of coordination: Secondary | ICD-10-CM

## 2018-09-27 DIAGNOSIS — Z9181 History of falling: Secondary | ICD-10-CM

## 2018-09-27 NOTE — Therapy (Signed)
[place] MAIN [facility] SERVICES [address] [place], [place], [address] Phone: [phone]   Fax:  [phone]  Physical Therapy Treatment  Patient Details  Name: Michelle Randall MRN: [id number] Date of Birth: 09-28-1996 No data recorded  Encounter Date: 09/27/2018  PT End of Session - 09/27/18 1445    Visit Number  145    Number of Visits  150    Date for PT Re-Evaluation  10/31/18    Authorization Time Period  8/12 visits authorization 9/25-12/17     PT Start Time  9735    PT Stop Time  1600    PT Time Calculation (min)  44 min    Equipment Utilized During Treatment  Gait belt    Activity Tolerance  Patient tolerated treatment well    Behavior During Therapy  [facility] for tasks assessed/performed       Past Medical History:  Diagnosis Date  . Neurogenic bladder   . Neurogenic bowel   . S/P VP shunt   . Spina bifida [facility])     Past Surgical History:  Procedure Laterality Date  . VENTRICULOPERITONEAL SHUNT      There were no vitals filed for this visit.  Subjective Assessment - 09/27/18 1609    Subjective  Patient reports doing well. Has been compliant with her HEP, enjoys doing the posterior pelvic tilts before bed for low back relief.     Patient is accompained by:  Family member    Limitations  Walking;Standing;House hold activities;Other (comment)    How long can you stand comfortably?  require use of bilateral crutches    How long can you walk comfortably?  dependent upon terrain    Patient Stated Goals  Patient wants to be able to negotiate uneven terrain at school, cross thresholds in home environment, and negotiate steps in household     Currently in Pain?  No/denies       NuStep Lvl 2 4 minutes with cues for staying >60 bpm for cardiovascular support   Airex pad; balloon taps 4 minutes; reaching inside and outside BOS; occasional need for UE support for stability   Reaching for bubbles inside and outside BOS with  single UE support via crutch 4 minutes, Reaching for bubbles without crutch support 3 minutes; CGA ; requires occasional squats and reaching up on tip toes to reach different levels of bubbles    Prone :   Hip flexor stretch 2 minutes each LE with overpressure  Supine:  Bridge 15x with PT support at feet/.  LE rotation low back stretch 2 minutes   Supine hooklying marches 10x with TrA activation    TrA activation with 4lb bar overhead raise and marches 10x each LE.    4lb bar reverse crunches 15x.    Posterior pelvic tilts 10x 3 second holds      Pt. response to medical necessity: . Patient will continue to benefit from skilled physical therapy in order to maximize functional mobility and safety.                          PT Education - 09/27/18 1445    Education provided  Yes    Education Details  exercise technique, stability     Person(s) Educated  Patient    Methods  Explanation;Demonstration;Verbal cues    Comprehension  Verbalized understanding;Returned demonstration       PT Short Term Goals - 08/10/18 [phone]  PT SHORT TERM GOAL #1   Title  Patient will report compliance with HEP for continued strengthening and stability during functional mobility.     Baseline  HEP compliant    Time  2    Period  Weeks    Status  Achieved        PT Long Term Goals - 08/09/18 1625      PT LONG TERM GOAL #1   Title  Patient will improve Dynamic Gait Index (DGI) score to > 21/24 for meaningful improvement and low falls risk regarding dynamic walking tasks (revised from > 19/24 )    Baseline  14/24; 11/02/16: 15/24 01/28/17: 18/24 6/5: 19/24; 08/23/17 14/24,  14/24 10/11/17 14/24 12/20/17, 12/27/17/  14/24 9/6 deferred due to precautions    Time  12    Period  Weeks    Status  On-going    Target Date  10/31/18      PT LONG TERM GOAL #2   Title  Patient will increase Berg Balance score by >51/56 points to be considered a low risk for falls for improved  safety. (revised from >6 points improvement)    Baseline  11/02/16: 34/56 01/28/17: 39/56 6/5: 45/56; 08/23/2017 = 45/56, 43/56 10/11/17, 43/56 12/20/17, 12/27/17  43/56 6/20: 38/56 no UE support ; 8/8: 40/56 no UE support 9/6: deferred due to precautions; 9/25: 42/56    Time  12    Period  Weeks    Status  On-going    Target Date  10/31/18      PT LONG TERM GOAL #3   Title  Patient will be able to transfer in and out of a large car with  a high seat with CGA  to improve ability to go to school/doctor visits.     Baseline  Patient requires min A for transfer into large SUV unless it has a step rail on the side; 5/15: pt requires assist with a really high car but can get out of an SUV without assist, typically needs more assits getting in and getting in on the L side; 6/20: if ledge able to do with little to no assistance.  9/6 deferred due to precautions 9/25: if ledge is able to do with little or not assistance    Time  12    Period  Weeks    Status  Partially Met    Target Date  10/31/18      PT LONG TERM GOAL #4   Title  Patient will complete a TUG test in < 12 seconds for independent mobility and decreased fall risk     Baseline  11.45; 08/23/2017 = 13.66 sec, 13.40 12/20/17, 12/27/17 12.07 sec; 5/15: 13.42 seconds 6/20: 12 seconds     Time  12    Period  Weeks    Status  Achieved      PT LONG TERM GOAL #5   Title  Patient will improve 6 minute walk distance by > 150 ft for improved return to functional community activities     Baseline  740 01/28/2017: 738f 6/5: 795  730 on 07/19/17; 790 ft on 08/23/18,  740 feet, 730 feet 12/20/17, 12/27/17 650 feet 6/20: 665 8/8: 701 9/6: deferred due to precautions 9/25: 6447f   Time  12    Period  Weeks    Status  On-going    Target Date  10/31/18      PT LONG TERM GOAL #6   Title  Patient will improve gait speed  to > 1.2 m/s with least restrictive assistive device to return to normal walking speed     Baseline  .76 m/h 01/28/2017: .87  07/19/17 0.62ms; 0.77  m/s on 08/23/17, . 58 m/sec 10/11/17 .662mec 6/20: 77 m/s  8/8: .772m9/6: terminated due to precuations for HR 9/25: 0.47m107m  Time  12    Period  Weeks    Status  On-going   03/21/18 target dat     PT LONG TERM GOAL #7   Title  Patient (< 60 y7rs old) will complete five times sit to stand test in < 10 seconds indicating an increased LE strength and improved balance     Baseline  9.45 sec; 9.63 on 08/23/17, 9.63 sec 12/27/17, 10.07 sec    Time  12    Period  Weeks    Status  Achieved   03/26/18 target date     [place]  Title  Patient will be able to ambulate on inclines and grass independenlty with LRAD    Baseline  Can ambulate over grassy inclines with decreased speed and safety requiring need loftstrand curtches and SBA; 5/15: pt reports she has to walk slowly and feels unsteady but that this is improving6/20 : reports cautious and slow walk, able to do mod I9/6:  deffered due to precautions9/25: able to do slowly and with caution due to fear of falling, able to do mod I    Time  12    Period  Weeks    Status  On-going   03/21/18 target date     PT LONG TERM GOAL  #9   TITLE  Patient will be able to transfer from low chair or stool without UE support independently    Baseline  able to perform independently when not tired, but when fatigued requires support.     Time  12    Period  Weeks    Status  Partially Met   03/21/18 target date     PT LONG TERM GOAL  #10   TITLE  Patient will be able to transfer from the floor to standing independently with use of LRAD    Baseline  difficult on different types of floor. Slippery floors more challenging and how long on the floor. Only need assistance when not able to put crutch against surface. 9/25: can do mod I if immediately stands back up, requires assistance if is in position for long, more difficulty with harder surfaces    Time  12    Period  Weeks    Status  On-going   03/21/18 target date     PT LONG TERM GOAL  #11    TITLE  Patient will step up onto a 6" step 5x with AD independently to increase community mobility    Baseline  6/20: challenging to patient ascending steps, supervision ; 8/8: difficult clearing L foot /14: can step up curb independently     Time  12    Period  Weeks    Status  Achieved   01/03/18 target date           Plan - 09/27/18 1612    Clinical Impression Statement  Patient fatigued quickly when reaching inside and outside BOS without UE support with LLE fatiguing quicker than R. Patient demonstrated good stability initially with occasional assistance required when fatigued.   Patient will continue to benefit from skilled physical therapy in order to maximize functional mobility and safety.  Rehab Potential  Good    Clinical Impairments Affecting Rehab Potential  weakness and decreased standing balance    PT Frequency  1x / week    PT Duration  12 weeks    PT Treatment/Interventions  Therapeutic exercise;Therapeutic activities;Gait training;Balance training;Stair training;DME Instruction;Neuromuscular re-education;Patient/family education;Manual techniques;Functional mobility training;Passive range of motion;Electrical Stimulation;Energy conservation;Orthotic Fit/Training    PT Next Visit Plan  strength, floor transfer, ambulation inclines/grass    PT Home Exercise Plan  added seated dynamic core UE and LE lift offs with visual cues using mirror, supine marches    Consulted and Agree with Plan of Care  Patient       Patient will benefit from skilled therapeutic intervention in order to improve the following deficits and impairments:  Abnormal gait, Decreased balance, Decreased endurance, Difficulty walking, Decreased strength, Decreased activity tolerance, Decreased coordination, Decreased mobility, Decreased range of motion, Impaired flexibility, Postural dysfunction, Improper body mechanics  Visit Diagnosis: Muscle weakness (generalized)  Lack of coordination  Difficulty  in walking, not elsewhere classified  Other lack of coordination  Personal history of fall     Problem List There are no active problems to display for this patient.   Janna Arch, PT, DPT   09/27/2018, 4:13 PM  [place] MAIN [facility] SERVICES [address] [place], [place], [address] Phone: [phone]   Fax:  [phone]  Name: Michelle Randall MRN: [id number] Date of Birth: 09/08/1996

## 2018-09-27 NOTE — Therapy (Addendum)
[place] MAIN [facility] SERVICES [address] [place], [place], [address] Phone: [phone]   Fax:  [phone]  Occupational Therapy Treatment  Patient Details  Name: Michelle Randall MRN: [id number] Date of Birth: 1996/07/15 No data recorded  Encounter Date: 09/27/2018  OT End of Session - 09/27/18 1615    Visit Number  136    Number of Visits  142    Date for OT Re-Evaluation  10/12/18    Authorization Type  medicaid visit 105 of 118    OT Start Time  1430    OT Stop Time  1515    OT Time Calculation (min)  45 min    Activity Tolerance  Patient tolerated treatment well    Behavior During Therapy  WFL for tasks assessed/performed       Past Medical History:  Diagnosis Date  . Neurogenic bladder   . Neurogenic bowel   . S/P VP shunt   . Spina bifida [facility])     Past Surgical History:  Procedure Laterality Date  . VENTRICULOPERITONEAL SHUNT      There were no vitals filed for this visit.  Subjective Assessment - 09/27/18 1443    Subjective   Pt reports she is having a good day and that she is looking forward to the end of the semester.    Patient is accompained by:  --    Patient Stated Goals  To be as independent as possible at home and at school.     Currently in Pain?  No/denies    Pain Score  0-No pain      OT TREATMENT  Therapeutic exercise:   Pt. worked on Retail [employer] using Putty[employer] tools with pink theraputty using the knob turn, cap turn, and key turn attachments to target specific components of movements in the hand. Pt demonstrated increased effort when using the key turn attach and required increased time to turn in both directions. Pt required x4 rest breaks during the task.  Self-care:   Pt completed simulated bed making task that required her to place a pillowcase on a pillow and dress a mat with a flat sheet. Pt required increased time to complete task. Pt used strategy to complete one side at a time  to conserve energy and minimize the number of time she has to walk to either side of the mat. Pt required minimal assistance to unfold the flat sheet before placing it on the mat.                 OT Education - 09/27/18 1447    Education provided  Yes    Education Details  hand strengthening and making bed    Person(s) Educated  Patient    Methods  Explanation;Demonstration    Comprehension  Verbalized understanding;Returned demonstration;Tactile cues required          OT Long Term Goals - 07/20/18 1355      OT LONG TERM GOAL #1   Title  Patient will demonstrate increased strength in right hand sufficient to scoop ice cream from frozen container with modified independence.    Baseline  07/20/2018:  Pt. is able to independently scoop soft ice cream. Requires modA to scoop firm, hard ice cream.    Time  12    Period  Weeks    Status  On-going    Target Date  07/20/18      OT LONG TERM GOAL #2   Title  Patient will  be able to make her bed with modified independence.    Baseline  07/20/2018: decreased efficiency with Increased time to complete. Pt. requires help with a heavier comforter.     Time  6    Period  Weeks    Status  On-going    Target Date  07/20/18      OT LONG TERM GOAL #3   Title  Patient will complete back closure bra donning with modified independence.     Baseline  max assist at eval    Time  12    Period  Weeks    Status  Achieved      OT LONG TERM GOAL #4   Title  Patient will increase BUE strength by 1 mm grade to be able to self propel her wheelchair over carpet for distances greater than 300 feet    Baseline  07/20/2018: Before the surgery, pt. was able to go longer distances up to 200 feet but had difficulty with rocky, uneven areas. Pt. now has restrictions following surgery, and is unable to propel her own chair.     Time  12    Period  Weeks    Status  On-going    Target Date  10/12/18      OT LONG TERM GOAL #5   Title  Patient will  demonstrate the ability to perform small buttons on shirts independently and with good speed in order to dress for school.    Baseline  goal update: Patient can manipulate buttons however needs assist, and has difficulty if rushed when dressing in the morning     Time  12    Period  Weeks    Status  Revised    Target Date  10/12/18      OT LONG TERM GOAL #6   Title  Patient will donn and doff small earrings with modified independence.    Baseline  07/20/2018: Pt. has difficulty manipulating the earring backings    Time  12    Period  Weeks    Status  Partially Met      OT LONG TERM GOAL #7   Title  Patient will demonstrate cutting vegetables with modified independence with use of adapted equipment and modified techniques    Baseline  07/20/2018: unable to cut hard vegetables such as carrots     Time  8    Period  Weeks    Status  On-going      OT LONG TERM GOAL #8   Title  Patient to complete am self care demonstrating efficient methods of dressing skills to get to school on time.     Baseline  Goal update:  patient reports it takes about an hour to get dressed, running late one time this week.  Still working on efficiency and speed of tasks (07/20/2018:since the surgery pt. is requiring assist secondary to surgical restrictions)    Time  12    Period  Weeks    Status  Achieved      OT LONG TERM GOAL  #9   Baseline  Patient will don/doff socks and AFO braces with modified independence using adaptive equipment as needed.  Socks and braces are more difficult after showering and if socks are new, otherwise she can do it.     Time  12    Period  Weeks    Status  Achieved      OT LONG TERM GOAL  #10   TITLE  Patient will demonstrate  the ability to pull jeans down over AFOs with modified independence.     Baseline  07/20/2018:  more difficulty if she wears skinny jeans. (increased assist required secondary to surgical restrictions with bending, twisting, lifting, pulling)    Time  12     Period  Weeks    Status  On-going      OT LONG TERM GOAL  #11   TITLE  Patient will demonstrate moving objects in the kitchen from one surface to another with modified independence and good safety techniques.     Baseline  07/20/2018: unable to move larger bowls from one place to another.      Time  12    Period  Weeks    Status  On-going      OT LONG TERM GOAL  #12   TITLE  Patient will demonstrate increased grip strength to be able to wring out kitchen cloth sufficiently so water does not drip onto the floor and cause a slip hazard.     Baseline  07/20/18: Pt. has difficulty getting all the water out of cloth    Time  8    Period  Weeks    Status  On-going      OT LONG TERM GOAL  #13   TITLE  Patient will demonstrate ability to use whisk to scramble eggs independently.    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     Baseline  Continued difficulty coordinating the use of both hands to complete task, speed still an issue     Time  6    Period  Months    Status  Achieved      OT LONG TERM GOAL  #15   TITLE  Patient will complete meal preparation with minimal assist.     Baseline  07/20/2018: continues to require moderate assist with meal prep    Time  12    Period  Weeks    Status  On-going            Plan - 09/27/18 1616    Clinical Impression Statement  Pt continues to work to improve [facility] skills and hand strength to increase independence during ADLs and IADLs. Pt worked on managing bed linens to simulate bed making. Pt has difficulties managing bed linens of increased weight and sizes including her queen size down comforter. Pt to continue to develop strategies to increase independence and efficiency when making her bed. Pt worked on  hand strengthening exercises to promote independence when opening jars and containers.    Occupational Profile and client history currently impacting functional performance  decreased hand strength, balance and coordination.    Occupational performance deficits (Please refer to evaluation for details):  ADL's;IADL's;Leisure;Education    Rehab Potential  Good    OT Frequency  1x / week    OT Duration  12 weeks    OT Treatment/Interventions  Self-care/ADL training;DME and/or AE instruction;Therapeutic activities;Therapeutic exercise;Neuromuscular education;Patient/family education    Consulted and Agree with Plan of Care  Patient    Family Member Consulted  mom       Patient will benefit from skilled therapeutic  intervention in order to improve the following deficits and impairments:  Impaired UE functional use, Difficulty walking, Decreased strength, Decreased coordination, Decreased endurance, Decreased balance  Visit Diagnosis: Muscle weakness (generalized)    Problem List There are no active problems to display for this patient.   Oliver Hum, OTS 09/27/2018, 4:22 PM  This entire session was performed under direct supervision and direction of a licensed therapist/therapist assistant . I have personally read, edited and approve of the note as written.  Harrel Carina, MS, OTR/L  [place] MAIN [facility] SERVICES [address] [place], [place], [address] Phone: [phone]   Fax:  [phone]  Name: Michelle Randall MRN: [id number] Date of Birth: 10/04/1996

## 2018-10-04 ENCOUNTER — Encounter: Payer: Self-pay | Admitting: Occupational Therapy

## 2018-10-04 ENCOUNTER — Ambulatory Visit: Payer: Medicaid Other | Admitting: Occupational Therapy

## 2018-10-04 ENCOUNTER — Ambulatory Visit: Payer: Medicaid Other

## 2018-10-04 DIAGNOSIS — M6281 Muscle weakness (generalized): Secondary | ICD-10-CM

## 2018-10-04 DIAGNOSIS — R262 Difficulty in walking, not elsewhere classified: Secondary | ICD-10-CM

## 2018-10-04 DIAGNOSIS — R531 Weakness: Secondary | ICD-10-CM

## 2018-10-04 NOTE — Therapy (Signed)
[place] MAIN [facility] SERVICES [address] [place], [place], [address] Phone: [phone]   Fax:  [phone]  Physical Therapy Treatment/Progress Note  Dates of reporting period 08/09/18   to   10/04/18  Patient Details  Name: Michelle Randall MRN: [id number] Date of Birth: [birth year] No data recorded  Encounter Date: 10/04/2018  PT End of Session - 10/04/18 1539    Visit Number  146    Number of Visits  150    Date for PT Re-Evaluation  10/31/18    Authorization Type  9/12    Authorization Time Period  9/12 visits authorization 9/25-12/17     PT Start Time  1515    PT Stop Time  1600    PT Time Calculation (min)  45 min    Equipment Utilized During Treatment  Gait belt    Activity Tolerance  Patient tolerated treatment well    Behavior During Therapy  [facility] for tasks assessed/performed       Past Medical History:  Diagnosis Date  . Neurogenic bladder   . Neurogenic bowel   . S/P VP shunt   . Spina bifida [facility])     Past Surgical History:  Procedure Laterality Date  . VENTRICULOPERITONEAL SHUNT      There were no vitals filed for this visit.  Subjective Assessment - 10/04/18 1535    Subjective  Patient reports doing well. Has been compliant with her HEP. No pain. No specific questions/concerns    Patient is accompained by:  Family member    Limitations  Walking;Standing;House hold activities;Other (comment)    How long can you stand comfortably?  require use of bilateral crutches    How long can you walk comfortably?  dependent upon terrain    Patient Stated Goals  Patient wants to be able to negotiate uneven terrain at school, cross thresholds in home environment, and negotiate steps in household     Currently in Pain?  No/denies                [facility] PT Assessment - 10/04/18 1538      Standardized Balance Assessment   Standardized Balance Assessment  Dynamic Gait Index      Dynamic Gait Index   Level Surface   Mild Impairment    Change in Gait Speed  Mild Impairment    Gait with Horizontal Head Turns  Normal    Gait with Vertical Head Turns  Mild Impairment    Gait and Pivot Turn  Normal    Step Over Obstacle  Moderate Impairment    Step Around Obstacles  Normal    Steps  Moderate Impairment    Total Score  17            TREATMENT   Therapeutic Exercise: TUG: 13.0s,  68m self-selected: 16.2s = 0.62 m/s, fastest: 14.2s = 0.70 m/s DGI: 17/24 Chest lift/hundreds prep x 10 Hundreds (Pilates pulse); Supine bridge x 15; Hooklying marching x 10 bilateral; Resisted isometric lumbar rotation 3s hold x 10 bilateral; Hooklying clams with gentle manual resistance on L side x 15 bilateral; Hooklying adductor squeeze with gentle manual resistance x 15; Hip abduction x 10 bilateral with gentle manual resistance; Manually resisted leg press x 10 bilateral; Front knee planks 30s hold x 4 with intermittent tactile cues for proper positioning as well as intermittent perturbations;   Pt educated throughout session about proper posture and technique with exercises. Improved exercise technique, movement at target joints, use  of target muscles after min to mod verbal, visual, tactile cues.   Patientcontinues topresent to clinic with excellent motivation. She continues to benefit from strengtheningespecially with abdominal stabilization.Improved strength noted during front planks. Completed TUG, 40mgait speed, and DGI with patient which are unchanged since last time they were updated. Will complete additional outcome measures at next visit such as 6MWT and BERG. Pt encouraged to continue HEP.Shewill benefit from continued skilled therapeutic intervention to address deficits in balance, strength, and mobility as well as improve overall QOL.                     PT Short Term Goals - 10/04/18 1716      PT SHORT TERM GOAL #1   Title  Patient will report compliance with HEP  for continued strengthening and stability during functional mobility.     Baseline  HEP compliant    Time  2    Period  Weeks    Status  Achieved        PT Long Term Goals - 10/04/18 1524      PT LONG TERM GOAL #1   Title  Patient will improve Dynamic Gait Index (DGI) score to > 21/24 for meaningful improvement and low falls risk regarding dynamic walking tasks (revised from > 19/24 )    Baseline  14/24; 11/02/16: 15/24 01/28/17: 18/24 6/5: 19/24; 08/23/17 14/24,  14/24 10/11/17 14/24 12/20/17, 12/27/17/  14/24 9/6 deferred due to precautions; 10/04/18: 17/24    Time  12    Period  Weeks    Status  On-going    Target Date  10/31/18      PT LONG TERM GOAL #2   Title  Patient will increase Berg Balance score by >51/56 points to be considered a low risk for falls for improved safety. (revised from >6 points improvement)    Baseline  11/02/16: 34/56 01/28/17: 39/56 6/5: 45/56; 08/23/2017 = 45/56, 43/56 10/11/17, 43/56 12/20/17, 12/27/17  43/56 6/20: 38/56 no UE support ; 8/8: 40/56 no UE support 9/6: deferred due to precautions; 9/25: 42/56    Time  12    Period  Weeks    Status  Deferred    Target Date  10/31/18      PT LONG TERM GOAL #3   Title  Patient will be able to transfer in and out of a large car with  a high seat with CGA  to improve ability to go to school/doctor visits.     Baseline  Patient requires min A for transfer into large SUV unless it has a step rail on the side; 5/15: pt requires assist with a really high car but can get out of an SUV without assist, typically needs more assits getting in and getting in on the L side; 6/20: if ledge able to do with little to no assistance.  9/6 deferred due to precautions 9/25: if ledge is able to do with little or not assistance    Time  12    Period  Weeks    Status  Partially Met    Target Date  10/31/18      PT LONG TERM GOAL #4   Title  Patient will complete a TUG test in < 12 seconds for independent mobility and decreased fall risk      Baseline  11.45; 08/23/2017 = 13.66 sec, 13.40 12/20/17, 12/27/17 12.07 sec; 5/15: 13.42 seconds 6/20: 12 seconds, 10/04/18: 13.0s     Time  12  Period  Weeks    Status  On-going    Target Date  10/31/18      PT LONG TERM GOAL #5   Title  Patient will improve 6 minute walk distance by > 150 ft for improved return to functional community activities     Baseline  740 01/28/2017: 75f 6/5: 795  730 on 07/19/17; 790 ft on 08/23/18,  740 feet, 730 feet 12/20/17, 12/27/17 650 feet 6/20: 665 8/8: 701 9/6: deferred due to precautions 9/25: 6466f   Time  12    Period  Weeks    Status  Deferred    Target Date  10/31/18      PT LONG TERM GOAL #6   Title  Patient will improve gait speed to > 1.2 m/s with least restrictive assistive device to return to normal walking speed     Baseline  .76 m/h 01/28/2017: .87  07/19/17 0.6940m 0.77 m/s on 08/23/17, . 58 m/sec 10/11/17 .62m25m 6/20: 77 m/s  8/8: .45m/27m6: terminated due to precuations for HR 9/25: 0.65m/s44mlf-selected: 16.2s = 0.62 m/s, fastest: 14.2s = 0.70 m/s    Time  12    Period  Weeks    Status  On-going   03/21/18 target dat     PT LONG TERM GOAL #7   Title  Patient (< 60 yea25 old) will complete five times sit to stand test in < 10 seconds indicating an increased LE strength and improved balance     Baseline  9.45 sec; 9.63 on 08/23/17, 9.63 sec 12/27/17, 10.07 sec    Time  12    Period  Weeks    Status  Achieved   03/26/18 target date     PT LONG TPly[place]Title  Patient will be able to ambulate on inclines and grass independenlty with LRAD    Baseline  Can ambulate over grassy inclines with decreased speed and safety requiring need loftstrand curtches and SBA; 5/15: pt reports she has to walk slowly and feels unsteady but that this is improving6/20 : reports cautious and slow walk, able to do mod I9/6:  deffered due to precautions9/25: able to do slowly and with caution due to fear of falling, able to do mod I    Time  12    Period   Weeks    Status  On-going   03/21/18 target date     PT LONG TERM GOAL  #9   TITLE  Patient will be able to transfer from low chair or stool without UE support independently    Baseline  able to perform independently when not tired, but when fatigued requires support.     Time  12    Period  Weeks    Status  Partially Met   03/21/18 target date     PT LONG TERM GOAL  #10   TITLE  Patient will be able to transfer from the floor to standing independently with use of LRAD    Baseline  difficult on different types of floor. Slippery floors more challenging and how long on the floor. Only need assistance when not able to put crutch against surface. 9/25: can do mod I if immediately stands back up, requires assistance if is in position for long, more difficulty with harder surfaces    Time  12    Period  Weeks    Status  On-going   03/21/18 target date     PT LONG TERM GOAL  #  11   TITLE  Patient will step up onto a 6" step 5x with AD independently to increase community mobility    Baseline  6/20: challenging to patient ascending steps, supervision ; 8/8: difficult clearing L foot /14: can step up curb independently     Time  12    Period  Weeks    Status  Achieved   01/03/18 target date           Plan - 10/04/18 1540    Clinical Impression Statement  Patientcontinues topresent to clinic with excellent motivation. She continues to benefit from strengtheningespecially with abdominal stabilization.Improved strength noted during front planks. Completed TUG, 65mgait speed, and DGI with patient which are unchanged since last time they were updated. Will complete additional outcome measures at next visit such as 6MWT and BERG. Pt encouraged to continue HEP.Shewill benefit from continued skilled therapeutic intervention to address deficits in balance, strength, and mobility as well as improve overall QOL.    Rehab Potential  Good    Clinical Impairments Affecting Rehab Potential  weakness and  decreased standing balance    PT Frequency  1x / week    PT Duration  12 weeks    PT Treatment/Interventions  Therapeutic exercise;Therapeutic activities;Gait training;Balance training;Stair training;DME Instruction;Neuromuscular re-education;Patient/family education;Manual techniques;Functional mobility training;Passive range of motion;Electrical Stimulation;Energy conservation;Orthotic Fit/Training    PT Next Visit Plan  strength, floor transfer, ambulation inclines/grass    PT Home Exercise Plan  added seated dynamic core UE and LE lift offs with visual cues using mirror, supine marches    Consulted and Agree with Plan of Care  Patient       Patient will benefit from skilled therapeutic intervention in order to improve the following deficits and impairments:  Abnormal gait, Decreased balance, Decreased endurance, Difficulty walking, Decreased strength, Decreased activity tolerance, Decreased coordination, Decreased mobility, Decreased range of motion, Impaired flexibility, Postural dysfunction, Improper body mechanics  Visit Diagnosis: Muscle weakness (generalized)  Difficulty in walking, not elsewhere classified  Weakness generalized     Problem List There are no active problems to display for this patient.  JPhillips GroutPT, DPT, GCS  Huprich,Jason 10/04/2018, 5:22 PM  [place] [facility] [address] [place] [address]Phone: [phone]  Fax:  [phone] Name: Michelle SKUFCAMRN: [id number] of Birth: 908-21-1997

## 2018-10-04 NOTE — Therapy (Signed)
[place] MAIN [facility] SERVICES [address] [place], [place], [address] Phone: [phone]   Fax:  [phone]  Occupational Therapy Treatment  Patient Details  Name: Michelle Randall MRN: [id number] Date of Birth: July 28, 1996 No data recorded  Encounter Date: 10/04/2018  OT End of Session - 10/04/18 1438    Visit Number  137    Number of Visits  142    Date for OT Re-Evaluation  10/12/18    Authorization Type  medicaid visit 106 of 118    OT Start Time  1430    OT Stop Time  1515    OT Time Calculation (min)  45 min    Activity Tolerance  Patient tolerated treatment well    Behavior During Therapy  WFL for tasks assessed/performed       Past Medical History:  Diagnosis Date  . Neurogenic bladder   . Neurogenic bowel   . S/P VP shunt   . Spina bifida [facility])     Past Surgical History:  Procedure Laterality Date  . VENTRICULOPERITONEAL SHUNT      There were no vitals filed for this visit.  Subjective Assessment - 10/04/18 1435    Subjective   Pt reports she is doing well and that she is preparing for final exams soon.    Patient is accompained by:  Family member    Patient Stated Goals  To be as independent as possible at home and at school.     Currently in Pain?  No/denies    Pain Score  0-No pain       OT TREATMENT  Therapeutic exercise: Pt worked on increasing 3-point and lateral pinch strength bilaterally. Pt pushed and removed push pins into a cork board with increased resistance. Pt required increased time and exerted increased effort when using 3-point pinch to push pins into board at a vertical angle.  Self-care: Pt then used a standard steel kitchen fork and butter knife to cut green theraputty to simulate cutting tough foods. Pt required increased time as putty was formed in larger pieces to simulate larger/tougher meats.                  OT Education - 10/04/18 1436    Education provided  Yes     Education Details  hand strengthening and making bed    Person(s) Educated  Patient    Methods  Explanation;Demonstration    Comprehension  Verbalized understanding;Returned demonstration;Tactile cues required          OT Long Term Goals - 07/20/18 1355      OT LONG TERM GOAL #1   Title  Patient will demonstrate increased strength in right hand sufficient to scoop ice cream from frozen container with modified independence.    Baseline  07/20/2018:  Pt. is able to independently scoop soft ice cream. Requires modA to scoop firm, hard ice cream.    Time  12    Period  Weeks    Status  On-going    Target Date  07/20/18      OT LONG TERM GOAL #2   Title  Patient will be able to make her bed with modified independence.    Baseline  07/20/2018: decreased efficiency with Increased time to complete. Pt. requires help with a heavier comforter.     Time  6    Period  Weeks    Status  On-going    Target Date  07/20/18  OT LONG TERM GOAL #3   Title  Patient will complete back closure bra donning with modified independence.     Baseline  max assist at eval    Time  12    Period  Weeks    Status  Achieved      OT LONG TERM GOAL #4   Title  Patient will increase BUE strength by 1 mm grade to be able to self propel her wheelchair over carpet for distances greater than 300 feet    Baseline  07/20/2018: Before the surgery, pt. was able to go longer distances up to 200 feet but had difficulty with rocky, uneven areas. Pt. now has restrictions following surgery, and is unable to propel her own chair.     Time  12    Period  Weeks    Status  On-going    Target Date  10/12/18      OT LONG TERM GOAL #5   Title  Patient will demonstrate the ability to perform small buttons on shirts independently and with good speed in order to dress for school.    Baseline  goal update: Patient can manipulate buttons however needs assist, and has difficulty if rushed when dressing in the morning     Time  12     Period  Weeks    Status  Revised    Target Date  10/12/18      OT LONG TERM GOAL #6   Title  Patient will donn and doff small earrings with modified independence.    Baseline  07/20/2018: Pt. has difficulty manipulating the earring backings    Time  12    Period  Weeks    Status  Partially Met      OT LONG TERM GOAL #7   Title  Patient will demonstrate cutting vegetables with modified independence with use of adapted equipment and modified techniques    Baseline  07/20/2018: unable to cut hard vegetables such as carrots     Time  8    Period  Weeks    Status  On-going      OT LONG TERM GOAL #8   Title  Patient to complete am self care demonstrating efficient methods of dressing skills to get to school on time.     Baseline  Goal update:  patient reports it takes about an hour to get dressed, running late one time this week.  Still working on efficiency and speed of tasks (07/20/2018:since the surgery pt. is requiring assist secondary to surgical restrictions)    Time  12    Period  Weeks    Status  Achieved      OT LONG TERM GOAL  #9   Baseline  Patient will don/doff socks and AFO braces with modified independence using adaptive equipment as needed.  Socks and braces are more difficult after showering and if socks are new, otherwise she can do it.     Time  12    Period  Weeks    Status  Achieved      OT LONG TERM GOAL  #10   TITLE  Patient will demonstrate the ability to pull jeans down over AFOs with modified independence.     Baseline  07/20/2018:  more difficulty if she wears skinny jeans. (increased assist required secondary to surgical restrictions with bending, twisting, lifting, pulling)    Time  12    Period  Weeks    Status  On-going  OT LONG TERM GOAL  #11   TITLE  Patient will demonstrate moving objects in the kitchen from one surface to another with modified independence and good safety techniques.     Baseline  07/20/2018: unable to move larger bowls from one  place to another.      Time  12    Period  Weeks    Status  On-going      OT LONG TERM GOAL  #12   TITLE  Patient will demonstrate increased grip strength to be able to wring out kitchen cloth sufficiently so water does not drip onto the floor and cause a slip hazard.     Baseline  07/20/18: Pt. has difficulty getting all the water out of cloth    Time  8    Period  Weeks    Status  On-going      OT LONG TERM GOAL  #13   TITLE  Patient will demonstrate ability to use whisk to scramble eggs independently.    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     Baseline  Continued difficulty coordinating the use of both hands to complete task, speed still an issue     Time  6    Period  Months    Status  Achieved      OT LONG TERM GOAL  #15   TITLE  Patient will complete meal preparation with minimal assist.     Baseline  07/20/2018: continues to require moderate assist with meal prep    Time  12    Period  Weeks    Status  On-going            Plan - 10/04/18 1439    Clinical Impression Statement  Pt continues to work on bilateral hand strength to increase independence during ADLs and IADLs. 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 Pt requires increased time to due so and exerts increased effort.    Occupational Profile and client history currently impacting functional performance  decreased hand strength, balance and coordination.    Occupational performance deficits (Please refer to evaluation for details):  ADL's;IADL's;Leisure;Education    Rehab Potential  Good    OT Frequency  1x / week    OT Duration  12 weeks    OT Treatment/Interventions   Self-care/ADL training;DME and/or AE instruction;Therapeutic activities;Therapeutic exercise;Neuromuscular education;Patient/family education    Plan  Occupational Therapy 1 x per week for ADL and strenghtening.    OT Home Exercise Plan  try elastic shoe laces and new tech for bra donning and doffing    Consulted and Agree with Plan of Care  Patient    Family Member Consulted  mom       Patient will benefit from skilled therapeutic intervention in order to improve the following deficits and impairments:  Impaired UE functional use, Difficulty walking, Decreased strength, Decreased coordination, Decreased endurance, Decreased balance  Visit Diagnosis: Muscle weakness (generalized)    Problem List There are no active problems to display for this patient.   Oliver Hum, OTS 10/04/2018,  2:44 PM  [place] MAIN [facility] SERVICES [address] [place], [place], [address] Phone: [phone]   Fax:  [phone]  Name: KARLA PAVONE MRN: [id number] Date of Birth: 09/05/1996

## 2018-10-11 ENCOUNTER — Encounter: Payer: Self-pay | Admitting: Occupational Therapy

## 2018-10-11 ENCOUNTER — Ambulatory Visit: Payer: Medicaid Other | Admitting: Physical Therapy

## 2018-10-11 ENCOUNTER — Ambulatory Visit: Payer: Medicaid Other | Admitting: Occupational Therapy

## 2018-10-11 ENCOUNTER — Encounter: Payer: Self-pay | Admitting: Physical Therapy

## 2018-10-11 DIAGNOSIS — R278 Other lack of coordination: Secondary | ICD-10-CM

## 2018-10-11 DIAGNOSIS — R262 Difficulty in walking, not elsewhere classified: Secondary | ICD-10-CM

## 2018-10-11 DIAGNOSIS — M6281 Muscle weakness (generalized): Secondary | ICD-10-CM | POA: Diagnosis not present

## 2018-10-11 DIAGNOSIS — Z9181 History of falling: Secondary | ICD-10-CM

## 2018-10-11 NOTE — Therapy (Signed)
[place] MAIN [facility] SERVICES [address] [place], [place], [address] Phone: [phone]   Fax:  [phone]  Physical Therapy Treatment  Patient Details  Name: Michelle Randall MRN: [id number] Date of Birth: 09-Feb-1996 No data recorded  Encounter Date: 10/11/2018  PT End of Session - 10/11/18 1510    Visit Number  147    Number of Visits  150    Date for PT Re-Evaluation  10/31/18    Authorization Type  10/12    Authorization Time Period  10/12 visits authorization 9/25-12/17     PT Start Time  1520    PT Stop Time  1600    PT Time Calculation (min)  40 min    Equipment Utilized During Treatment  Gait belt    Activity Tolerance  Patient tolerated treatment well    Behavior During Therapy  [facility] for tasks assessed/performed       Past Medical History:  Diagnosis Date  . Neurogenic bladder   . Neurogenic bowel   . S/P VP shunt   . Spina bifida [facility])     Past Surgical History:  Procedure Laterality Date  . VENTRICULOPERITONEAL SHUNT      There were no vitals filed for this visit.  Subjective Assessment - 10/11/18 1624    Subjective  Patient states that she is doing well and has no significant changes to report since her last visit. She is excited to attend a wedding for her extended family tomorrow and is looking forward to the end of the semester.    Limitations  Walking;Standing;House hold activities;Other (comment)    How long can you stand comfortably?  require use of bilateral crutches    How long can you walk comfortably?  dependent upon terrain    Patient Stated Goals  Patient wants to be able to negotiate uneven terrain at school, cross thresholds in home environment, and negotiate steps in household     Currently in Pain?  No/denies    Pain Score  0-No pain    Multiple Pain Sites  No      TREATMENT  Therapeutic Exercise  6MWT:  640 feet   Prone :   Hip flexor stretch 2 minutes each LE with overpressure     Supine:  Posterior Pelvic tilts 3 sec hold x10 Bridge 15x with PT support at feet   LE rotation low back stretch 2 minutes Roll up w/ 4lb bar  Pilates Spine Twist (long-sitting with thoracic rotation w/ UE abducted and ER) x6 each side Pilates Saw (long-sitting with thoracic rotation and trunk flexion w/ UE abducted and ER) x6 each side Pilates Deepen the Curve (TrA activation in short sitting) w/ feet on round side of BOSU x5 Pilates Short Box Abdominals Prep (TrA activation in short sitting w/ BUE shoulder flexion 4lb bar ) w/ feet on round side of BOSU x10 Pilates Short Box Abdominals [place] Prep modified (TrA activation with 4lb bar raise and modified marches) 10x each LE. Emphasis on keeping hips level throughout and maintaining erect posture and balance on sitting bones.    PT Education - 10/11/18 1509    Education provided  Yes    Education Details  exercise technique, body mechanics, movement adaptations    Person(s) Educated  Patient    Methods  Explanation;Demonstration;Verbal cues;Tactile cues    Comprehension  Verbalized understanding;Need further instruction;Returned demonstration       PT Short Term Goals - 10/04/18 1716  PT SHORT TERM GOAL #1   Title  Patient will report compliance with HEP for continued strengthening and stability during functional mobility.     Baseline  HEP compliant    Time  2    Period  Weeks    Status  Achieved        PT Long Term Goals - 10/04/18 1524      PT LONG TERM GOAL #1   Title  Patient will improve Dynamic Gait Index (DGI) score to > 21/24 for meaningful improvement and low falls risk regarding dynamic walking tasks (revised from > 19/24 )    Baseline  14/24; 11/02/16: 15/24 01/28/17: 18/24 6/5: 19/24; 08/23/17 14/24,  14/24 10/11/17 14/24 12/20/17, 12/27/17/  14/24 9/6 deferred due to precautions; 10/04/18: 17/24    Time  12    Period  Weeks    Status  On-going    Target Date  10/31/18      PT LONG TERM GOAL #2    Title  Patient will increase Berg Balance score by >51/56 points to be considered a low risk for falls for improved safety. (revised from >6 points improvement)    Baseline  11/02/16: 34/56 01/28/17: 39/56 6/5: 45/56; 08/23/2017 = 45/56, 43/56 10/11/17, 43/56 12/20/17, 12/27/17  43/56 6/20: 38/56 no UE support ; 8/8: 40/56 no UE support 9/6: deferred due to precautions; 9/25: 42/56    Time  12    Period  Weeks    Status  Deferred    Target Date  10/31/18      PT LONG TERM GOAL #3   Title  Patient will be able to transfer in and out of a large car with  a high seat with CGA  to improve ability to go to school/doctor visits.     Baseline  Patient requires min A for transfer into large SUV unless it has a step rail on the side; 5/15: pt requires assist with a really high car but can get out of an SUV without assist, typically needs more assits getting in and getting in on the L side; 6/20: if ledge able to do with little to no assistance.  9/6 deferred due to precautions 9/25: if ledge is able to do with little or not assistance    Time  12    Period  Weeks    Status  Partially Met    Target Date  10/31/18      PT LONG TERM GOAL #4   Title  Patient will complete a TUG test in < 12 seconds for independent mobility and decreased fall risk     Baseline  11.45; 08/23/2017 = 13.66 sec, 13.40 12/20/17, 12/27/17 12.07 sec; 5/15: 13.42 seconds 6/20: 12 seconds, 10/04/18: 13.0s     Time  12    Period  Weeks    Status  On-going    Target Date  10/31/18      PT LONG TERM GOAL #5   Title  Patient will improve 6 minute walk distance by > 150 ft for improved return to functional community activities     Baseline  740 01/28/2017: 757f 6/5: 795  730 on 07/19/17; 790 ft on 08/23/18,  740 feet, 730 feet 12/20/17, 12/27/17 650 feet 6/20: 665 8/8: 701 9/6: deferred due to precautions 9/25: 6462f   Time  12    Period  Weeks    Status  Deferred    Target Date  10/31/18      PT LONG TERM  GOAL #6   Title  Patient will  improve gait speed to > 1.2 m/s with least restrictive assistive device to return to normal walking speed     Baseline  .76 m/h 01/28/2017: .87  07/19/17 0.59ms; 0.77 m/s on 08/23/17, . 58 m/sec 10/11/17 .624mec 6/20: 77 m/s  8/8: .7724m9/6: terminated due to precuations for HR 9/25: 0.65m63mself-selected: 16.2s = 0.62 m/s, fastest: 14.2s = 0.70 m/s    Time  12    Period  Weeks    Status  On-going   03/21/18 target dat     PT LONG TERM GOAL #7   Title  Patient (< 60 y40rs old) will complete five times sit to stand test in < 10 seconds indicating an increased LE strength and improved balance     Baseline  9.45 sec; 9.63 on 08/23/17, 9.63 sec 12/27/17, 10.07 sec    Time  12    Period  Weeks    Status  Achieved   03/26/18 target date     [place]  Title  Patient will be able to ambulate on inclines and grass independenlty with LRAD    Baseline  Can ambulate over grassy inclines with decreased speed and safety requiring need loftstrand curtches and SBA; 5/15: pt reports she has to walk slowly and feels unsteady but that this is improving6/20 : reports cautious and slow walk, able to do mod I9/6:  deffered due to precautions9/25: able to do slowly and with caution due to fear of falling, able to do mod I    Time  12    Period  Weeks    Status  On-going   03/21/18 target date     PT LONG TERM GOAL  #9   TITLE  Patient will be able to transfer from low chair or stool without UE support independently    Baseline  able to perform independently when not tired, but when fatigued requires support.     Time  12    Period  Weeks    Status  Partially Met   03/21/18 target date     PT LONG TERM GOAL  #10   TITLE  Patient will be able to transfer from the floor to standing independently with use of LRAD    Baseline  difficult on different types of floor. Slippery floors more challenging and how long on the floor. Only need assistance when not able to put crutch against surface. 9/25: can do mod I  if immediately stands back up, requires assistance if is in position for long, more difficulty with harder surfaces    Time  12    Period  Weeks    Status  On-going   03/21/18 target date     PT LONG TERM GOAL  #11   TITLE  Patient will step up onto a 6" step 5x with AD independently to increase community mobility    Baseline  6/20: challenging to patient ascending steps, supervision ; 8/8: difficult clearing L foot /14: can step up curb independently     Time  12    Period  Weeks    Status  Achieved   01/03/18 target date           Plan - 10/11/18 1511    Clinical Impression Statement  Patient presents to clinic with excellent motivation to participate in therapy. Patient achieved 640 feet during a 6MWT with dual task and did not need any rest breaks.  Patient demonstrates improved trunk strength as evidenced by her ability to perform Pilates trunk flexion exercises with appropriate form and min VCs adn decreased compensatory movement patterns. Patient also able to dissociate thoracic rotation from lumbopelvic complex in long-sitting activities with no VCs or TCs. Patient will continue to benefit from skilled therapeutic intervention in order to progress strength, activity tolerance, balance, and mobility for improved overall QOL.    Rehab Potential  Good    Clinical Impairments Affecting Rehab Potential  weakness and decreased standing balance    PT Frequency  1x / week    PT Duration  12 weeks    PT Treatment/Interventions  Therapeutic exercise;Therapeutic activities;Gait training;Balance training;Stair training;DME Instruction;Neuromuscular re-education;Patient/family education;Manual techniques;Functional mobility training;Passive range of motion;Electrical Stimulation;Energy conservation;Orthotic Fit/Training    PT Next Visit Plan  strength, floor transfer, ambulation inclines/grass    PT Home Exercise Plan  added seated dynamic core UE and LE lift offs with visual cues using mirror,  supine marches    Consulted and Agree with Plan of Care  Patient       Patient will benefit from skilled therapeutic intervention in order to improve the following deficits and impairments:  Abnormal gait, Decreased balance, Decreased endurance, Difficulty walking, Decreased strength, Decreased activity tolerance, Decreased coordination, Decreased mobility, Decreased range of motion, Impaired flexibility, Postural dysfunction, Improper body mechanics  Visit Diagnosis: Muscle weakness (generalized)  Difficulty in walking, not elsewhere classified  Personal history of fall  Other lack of coordination     Problem List There are no active problems to display for this patient.   Myles Gip PT, DPT [phone] 10/11/2018, 4:25 PM  [place] MAIN [facility] SERVICES [address] [place], [place], [address] Phone: [phone]   Fax:  [phone]  Name: Michelle Randall MRN: [id number] Date of Birth: 1996-09-22

## 2018-10-11 NOTE — Therapy (Addendum)
[place] MAIN [facility] SERVICES [address] [place], [place], [address] Phone: [phone]   Fax:  [phone]  Occupational Therapy Treatment/Recertification Note  Patient Details  Name: Michelle Randall MRN: [id number] Date of Birth: 10-05-1996 No data recorded  Encounter Date: 10/11/2018  OT End of Session - 10/11/18 1441    Visit Number  138    Number of Visits  142    Date for OT Re-Evaluation  01/03/19    Authorization Type  medicaid visit 107 of 79    OT Start Time  1430    OT Stop Time  1515    OT Time Calculation (min)  45 min    Activity Tolerance  Patient tolerated treatment well    Behavior During Therapy  WFL for tasks assessed/performed       Past Medical History:  Diagnosis Date  . Neurogenic bladder   . Neurogenic bowel   . S/P VP shunt   . Spina bifida [facility])     Past Surgical History:  Procedure Laterality Date  . VENTRICULOPERITONEAL SHUNT      There were no vitals filed for this visit.  Subjective Assessment - 10/11/18 1441    Subjective   Pt reports she is doing well and is looking forward to a family wedding tomorrow.    Patient is accompained by:  Family member    Patient Stated Goals  To be as independent as possible at home and at school.     Currently in Pain?  No/denies    Pain Score  0-No pain         [facility] OT Assessment - 10/11/18 1458      Coordination   Right 9 Hole Peg Test  18 secs.    Left 9 Hole Peg Test  23 secs.      Hand Function   Right Hand Grip (lbs)  49    Right Hand Lateral Pinch  16 lbs    Right Hand 3 Point Pinch  11 lbs    Left Hand Grip (lbs)  45    Left Hand Lateral Pinch  13 lbs    Left 3 point pinch  13 lbs      Measurements for grip/pinch strength were obtained and goals were reviewed, discussed, and updated to reflect progress thus far.  OT TREATMENT  Neuromuscular re-education:  Pt. performed bilateral [facility] skills training to improve speed and dexterity.  Pt. demonstrated grasping 1 inch sticks,  inch cylindrical collars, and  inch flat washers on the [employer]ue pegboard. Pt. performed grasping each item with her 2nd digit and thumb, and storing them in the palm. Pt. presented with difficulty when increasing speed while completing task and frequently dropped items.        OT Education - 10/11/18 1441    Education provided  Yes    Education Details  [place] skills, POC, goals    Person(s) Educated  Patient    Methods  Explanation    Comprehension  Verbalized understanding          OT Long Term Goals - 10/11/18 1542      OT LONG TERM GOAL #1   Title  Patient will demonstrate increased strength in right hand sufficient to scoop ice cream from frozen container with modified independence.    Baseline  10/11/2018: Pt continues to work to be able to scoop firm, hard ice cream. Pt requires min/mod A at this time.    Time  12  Period  Weeks    Status  On-going    Target Date  01/03/19      OT LONG TERM GOAL #2   Title  Patient will be able to make her bed with modified independence.    Baseline  10/11/2018: Pt requires assistance to manage a large comforter    Time  12   Period  Weeks    Status  On-going    Target Date  01/03/19      OT LONG TERM GOAL #4   Title  Patient will increase BUE strength by 1 mm grade to be able to self propel her wheelchair over carpet for distances greater than 300 feet    Baseline  10/11/2018: Pt is able to propel maual w/c on carpeted surfaces for ~2 mins however fatigues quickly when doing so    Time  12    Period  Weeks    Status  On-going    Target Date  01/03/19      OT LONG TERM GOAL #5   Title  Patient will demonstrate the ability to perform small buttons on shirts independently and with good speed in order to dress for school.    Baseline  10/11/2018: Pt can fasten small buttons however demonstrates decreased accuracy when under time constraints.    Time  12    Period  Weeks    Status   On-going    Target Date  01/03/19      OT LONG TERM GOAL #6   Title  Patient will donn and doff small earrings with modified independence.    Baseline  10/11/2018: Pt requires increased time and frequently drops earrings when donning/doffing earrings with small rubber backings.    Time  12    Period  Weeks    Status  Partially Met      OT LONG TERM GOAL #7   Title  Patient will demonstrate cutting vegetables with modified independence with use of adapted equipment and modified techniques    Baseline  10/11/2018: Pt is unable to safely cut hard vegetables    Time  12   Period  Weeks    Status  On-going      OT LONG TERM GOAL  #10   TITLE  Patient will demonstrate the ability to pull jeans down over AFOs with modified independence.     Baseline  10/11/2018: Pt difficulty managing skinny jeans over her AFOs without assistance.    Time  12    Period  Weeks    Status  On-going      OT LONG TERM GOAL  #11   TITLE  Patient will demonstrate moving objects in the kitchen from one surface to another with modified independence and good safety techniques.     Baseline  10/11/2018: Pt requires increased time to move bowls with liquid contents without spilling or dropping them. Pt is able to move empty dishes of various sizes to/from various surfaces with modified independence.    Time  12    Period  Weeks    Status  On-going      OT LONG TERM GOAL  #12   TITLE  Patient will demonstrate increased grip strength to be able to wring out kitchen cloth sufficiently so water does not drip onto the floor and cause a slip hazard.     Baseline  10/11/2018: Pt continues to work to consistently wring cloths out to minimize water dripping onto floor.    Time  12  Period  Weeks    Status  On-going      OT LONG TERM GOAL  #15   TITLE  Patient will complete meal preparation with minimal assist.     Baseline  10/11/2018: Pt requires mod assistance for light meal prep    Time  12    Period  Weeks     Status  On-going            Plan - 10/11/18 1529    Clinical Impression Statement Measurements for grip/pinch strength and FMC were obtained and goals were reviewed, discussed, and updated to reflect progress thus far. Pt continues to work to increase independence during daily tasks such as scooping firm ice cream, donning/doffing small earrings, and preparing light meals. Pt has made progress in hand and grip strength and [place] bilaterally. Pt is now able to open various types of containers and get dressed more efficiently. Pt to continue to benefit from skilled OT services to increase skills to promote independence, safety, and efficiency in daily tasks such as increasing UE strength in order to propel w/c on various surfaces independently, to make a bed with a heavy comforter, and to chop hard vegetables during meal prep; to increase [facility] skills to fasten small buttons efficiently.    Occupational Profile and client history currently impacting functional performance  decreased hand strength, balance and coordination.    Occupational performance deficits (Please refer to evaluation for details):  ADL's;IADL's;Leisure;Education    Rehab Potential  Good    OT Frequency  1x / week    OT Duration  12 weeks    OT Treatment/Interventions  Self-care/ADL training;DME and/or AE instruction;Therapeutic activities;Therapeutic exercise;Neuromuscular education;Patient/family education    Plan  Occupational Therapy 1 x per week for ADL and strenghtening.    OT Home Exercise Plan  try elastic shoe laces and new tech for bra donning and doffing    Consulted and Agree with Plan of Care  Patient    Family Member Consulted  mom       Patient will benefit from skilled therapeutic intervention in order to improve the following deficits and impairments:  Impaired UE functional use, Difficulty walking, Decreased strength, Decreased coordination, Decreased endurance, Decreased balance  Visit Diagnosis: Other lack  of coordination    Problem List There are no active problems to display for this patient.   Oliver Hum, OTS 10/11/2018, 3:54 PM   This entire session was performed under direct supervision and direction of a licensed therapist/therapist assistant . I have personally read, edited and approve of the note as written.  Harrel Carina, MS, OTR/L   [place] MAIN [facility] SERVICES [address] [place], [place], [address] Phone: [phone]   Fax:  [phone]  Name: KJERSTIN ABRIGO MRN: [id number] Date of Birth: 16-Jan-1996

## 2018-10-18 ENCOUNTER — Ambulatory Visit: Payer: Medicaid Other | Attending: Student

## 2018-10-18 ENCOUNTER — Ambulatory Visit: Payer: Medicaid Other | Admitting: Occupational Therapy

## 2018-10-18 DIAGNOSIS — R262 Difficulty in walking, not elsewhere classified: Secondary | ICD-10-CM | POA: Diagnosis present

## 2018-10-18 DIAGNOSIS — R278 Other lack of coordination: Secondary | ICD-10-CM | POA: Diagnosis present

## 2018-10-18 DIAGNOSIS — M6281 Muscle weakness (generalized): Secondary | ICD-10-CM | POA: Diagnosis not present

## 2018-10-18 NOTE — Therapy (Signed)
[place] MAIN [facility] SERVICES [address] [place], [place], [address] Phone: [phone]   Fax:  [phone]  Physical Therapy Treatment  Patient Details  Name: Michelle Randall MRN: [id number] Date of Birth: Sep 23, 1996 No data recorded  Encounter Date: 10/18/2018  PT End of Session - 10/19/18 1156    Visit Number  148    Number of Visits  150    Date for PT Re-Evaluation  10/31/18    Authorization Type  11/12    Authorization Time Period  10/12 visits authorization 9/25-12/17     PT Start Time  1520    PT Stop Time  1600    PT Time Calculation (min)  40 min    Equipment Utilized During Treatment  Gait belt    Activity Tolerance  Patient tolerated treatment well    Behavior During Therapy  [facility] for tasks assessed/performed       Past Medical History:  Diagnosis Date  . Neurogenic bladder   . Neurogenic bowel   . S/P VP shunt   . Spina bifida [facility])     Past Surgical History:  Procedure Laterality Date  . VENTRICULOPERITONEAL SHUNT      There were no vitals filed for this visit.  Subjective Assessment - 10/19/18 1155    Subjective  Patient states that she is doing well and has no significant changes to report since her last visit. She is has exams next Wednesay through Friday. No specific questions or concerns currently    Limitations  Walking;Standing;House hold activities;Other (comment)    How long can you stand comfortably?  require use of bilateral crutches    How long can you walk comfortably?  dependent upon terrain    Patient Stated Goals  Patient wants to be able to negotiate uneven terrain at school, cross thresholds in home environment, and negotiate steps in household     Currently in Pain?  No/denies           TREATMENT   Therapeutic Exercise: Supine bridge x 15; Resisted isometric lumbar rotation 3s hold x 10 bilateral; Supinehip abduction x 10 bilateral with gentle manual resistance; Supine hip  adduction x 10 bilateral with gentle manual resistance; Manually resisted leg press x 10 bilateral; Chest lift/hundreds prep x 10 Hundreds (Pilates pulse); Hooklying clams with gentle manual resistance x 10; Seated pball balance NBOS with alternating marche x 10 bilateral; Seated pball balance NBOS with external perturbations x 2 minutes; Seated pball balance NBOS with dynamic reaching for cones alternating UE and crossing midline intermittently x 4 minutes;   Pt educated throughout session about proper posture and technique with exercises. Improved exercise technique, movement at target joints, use of target muscles after min to mod verbal, visual, tactile cues.   Patientcontinues topresent to clinic with excellent motivation. She continues to benefit from supine strengtheningespecially with abdominal stabilization.Worked on dynamic abdominal/lumbar strengthening sitting on physioball. Pt will need a few updated outcome measures/goals at next visit as well as reauthorization from [place]. Pt encouraged to continue HEP.Shewill benefit from continued skilled therapeutic intervention to address deficits in balance, strength, and mobility as well as improve overall QOL.                     PT Short Term Goals - 10/04/18 1716      PT SHORT TERM GOAL #1   Title  Patient will report compliance with HEP for continued strengthening and stability during functional mobility.  Baseline  HEP compliant    Time  2    Period  Weeks    Status  Achieved        PT Long Term Goals - 10/19/18 1158      PT LONG TERM GOAL #1   Title  Patient will improve Dynamic Gait Index (DGI) score to > 21/24 for meaningful improvement and low falls risk regarding dynamic walking tasks (revised from > 19/24 )    Baseline  14/24; 11/02/16: 15/24 01/28/17: 18/24 6/5: 19/24; 08/23/17 14/24,  14/24 10/11/17 14/24 12/20/17, 12/27/17/  14/24 9/6 deferred due to precautions; 10/04/18: 17/24    Time   12    Period  Weeks    Status  On-going      PT LONG TERM GOAL #2   Title  Patient will increase Berg Balance score by >51/56 points to be considered a low risk for falls for improved safety. (revised from >6 points improvement)    Baseline  11/02/16: 34/56 01/28/17: 39/56 6/5: 45/56; 08/23/2017 = 45/56, 43/56 10/11/17, 43/56 12/20/17, 12/27/17  43/56 6/20: 38/56 no UE support ; 8/8: 40/56 no UE support 9/6: deferred due to precautions; 9/25: 42/56    Time  12    Period  Weeks    Status  Deferred      PT LONG TERM GOAL #3   Title  Patient will be able to transfer in and out of a large car with  a high seat with CGA  to improve ability to go to school/doctor visits.     Baseline  Patient requires min A for transfer into large SUV unless it has a step rail on the side; 5/15: pt requires assist with a really high car but can get out of an SUV without assist, typically needs more assits getting in and getting in on the L side; 6/20: if ledge able to do with little to no assistance.  9/6 deferred due to precautions 9/25: if ledge is able to do with little or not assistance    Time  12    Period  Weeks    Status  Partially Met      PT LONG TERM GOAL #4   Title  Patient will complete a TUG test in < 12 seconds for independent mobility and decreased fall risk     Baseline  11.45; 08/23/2017 = 13.66 sec, 13.40 12/20/17, 12/27/17 12.07 sec; 5/15: 13.42 seconds 6/20: 12 seconds, 10/04/18: 13.0s     Time  12    Period  Weeks    Status  On-going      PT LONG TERM GOAL #5   Title  Patient will improve 6 minute walk distance by > 150 ft for improved return to functional community activities     Baseline  740 01/28/2017: 767f 6/5: 795  730 on 07/19/17; 790 ft on 08/23/18,  740 feet, 730 feet 12/20/17, 12/27/17 650 feet 6/20: 665 8/8: 701 9/6: deferred due to precautions 9/25: 6480f 10/11/18: 64067f  Time  12    Period  Weeks    Status  Deferred      PT LONG TERM GOAL #6   Title  Patient will improve gait speed  to > 1.2 m/s with least restrictive assistive device to return to normal walking speed     Baseline  .76 m/h 01/28/2017: .87  07/19/17 0.51m102m0.77 m/s on 08/23/17, . 58 m/sec 10/11/17 .45m/80m6/20: 77 m/s  8/8: .48m/s47m: terminated due to precuations  for HR 9/25: 0.34ms, self-selected: 16.2s = 0.62 m/s, fastest: 14.2s = 0.70 m/s    Time  12    Period  Weeks    Status  On-going   03/21/18 target dat     PT LONG TERM GOAL #7   Title  Patient (< 649years old) will complete five times sit to stand test in < 10 seconds indicating an increased LE strength and improved balance     Baseline  9.45 sec; 9.63 on 08/23/17, 9.63 sec 12/27/17, 10.07 sec    Time  12    Period  Weeks    Status  Achieved   03/26/18 target date     PT LCameron#8   Title  Patient will be able to ambulate on inclines and grass independenlty with LRAD    Baseline  Can ambulate over grassy inclines with decreased speed and safety requiring need loftstrand curtches and SBA; 5/15: pt reports she has to walk slowly and feels unsteady but that this is improving6/20 : reports cautious and slow walk, able to do mod I9/6:  deffered due to precautions9/25: able to do slowly and with caution due to fear of falling, able to do mod I    Time  12    Period  Weeks    Status  On-going   03/21/18 target date     PT LONG TERM GOAL  #9   TITLE  Patient will be able to transfer from low chair or stool without UE support independently    Baseline  able to perform independently when not tired, but when fatigued requires support.     Time  12    Period  Weeks    Status  Partially Met   03/21/18 target date     PT LONG TERM GOAL  #10   TITLE  Patient will be able to transfer from the floor to standing independently with use of LRAD    Baseline  difficult on different types of floor. Slippery floors more challenging and how long on the floor. Only need assistance when not able to put crutch against surface. 9/25: can do mod I if immediately  stands back up, requires assistance if is in position for long, more difficulty with harder surfaces    Time  12    Period  Weeks    Status  On-going   03/21/18 target date     PT LONG TERM GOAL  #11   TITLE  Patient will step up onto a 6" step 5x with AD independently to increase community mobility    Baseline  6/20: challenging to patient ascending steps, supervision ; 8/8: difficult clearing L foot /14: can step up curb independently     Time  12    Period  Weeks    Status  Achieved   01/03/18 target date           Plan - 10/19/18 1156    Clinical Impression Statement  Patientcontinues topresent to clinic with excellent motivation. She continues to benefit from supine strengtheningespecially with abdominal stabilization.Worked on dynamic abdominal/lumbar strengthening sitting on physioball. Pt will need a few updated outcome measures/goals at next visit as well as reauthorization from [place] Pt encouraged to continue HEP.Shewill benefit from continued skilled therapeutic intervention to address deficits in balance, strength, and mobility as well as improve overall QOL.    Rehab Potential  Good    Clinical Impairments Affecting Rehab Potential  weakness and decreased standing balance  PT Frequency  1x / week    PT Duration  12 weeks    PT Treatment/Interventions  Therapeutic exercise;Therapeutic activities;Gait training;Balance training;Stair training;DME Instruction;Neuromuscular re-education;Patient/family education;Functional mobility training;Passive range of motion;Electrical Stimulation;Energy conservation;Orthotic Fit/Training;Manual techniques    PT Next Visit Plan  Pt will need some updated outcome measures/goals, will need recert/reauthorization, continue strengthening/balance    PT Home Exercise Plan  added seated dynamic core UE and LE lift offs with visual cues using mirror, supine marches    Consulted and Agree with Plan of Care  Patient       Patient will  benefit from skilled therapeutic intervention in order to improve the following deficits and impairments:  Abnormal gait, Decreased balance, Decreased endurance, Difficulty walking, Decreased strength, Decreased activity tolerance, Decreased coordination, Decreased mobility, Decreased range of motion, Impaired flexibility, Postural dysfunction, Improper body mechanics  Visit Diagnosis: Muscle weakness (generalized)  Difficulty in walking, not elsewhere classified  Other lack of coordination     Problem List There are no active problems to display for this patient.   Huprich,Jason 10/19/2018, 12:08 PM  [place] MAIN [facility] SERVICES [address] [place], [place], [address] Phone: [phone]   Fax:  [phone]  Name: Michelle Randall MRN: [id number] Date of Birth: 02-10-1996

## 2018-10-18 NOTE — Therapy (Signed)
[place] MAIN [facility] SERVICES [address] [place], [place], [address] Phone: [phone]   Fax:  [phone]  Occupational Therapy Treatment  Patient Details  Name: Michelle Randall MRN: [id number] Date of Birth: 01-05-1996 No data recorded  Encounter Date: 10/18/2018  OT End of Session - 10/18/18 1642    Visit Number  140    Number of Visits  142    Date for OT Re-Evaluation  01/03/19    Authorization Type  medicaid visit 109 of 118    OT Start Time  1430    OT Stop Time  1515    OT Time Calculation (min)  45 min    Equipment Utilized During Treatment  lemon press    Activity Tolerance  Patient tolerated treatment well    Behavior During Therapy  WFL for tasks assessed/performed       Past Medical History:  Diagnosis Date  . Neurogenic bladder   . Neurogenic bowel   . S/P VP shunt   . Spina bifida [facility])     Past Surgical History:  Procedure Laterality Date  . VENTRICULOPERITONEAL SHUNT      There were no vitals filed for this visit.  Subjective Assessment - 10/18/18 1640    Subjective   Pt. has finished her semester classses, and will have final tomorrow.    Patient is accompained by:  Family member    Patient Stated Goals  To be as independent as possible at home and at school.     Currently in Pain?  No/denies      OT TREATMENT    Neuro muscular re-education:  Pt. worked on grasping, flipping, turning, and stacking [place] style discs. Pt. required visual demonstration, and cues for movement patterns. Pt. worked on speed, and Soil scientist. Pt. performed 2 reps. Pt. worked on grasping, and Stage manager. Pt. performed [facility] tasks using the Grooved pegboard. Pt. worked on grasping the grooved pegs from a horizontal position, and moving the pegs to a vertical position in the hand to prepare for placing them in the grooved slot.                          OT Education -  10/18/18 1641    Education provided  Yes    Education Details  Rossie skills, POC, goals    Person(s) Educated  Patient    Methods  Explanation    Comprehension  Verbalized understanding    Person(s) Educated  Patient    Methods  Explanation;Demonstration;Verbal cues;Tactile cues    Comprehension  Verbalized understanding;Need further instruction;Returned demonstration          OT Long Term Goals - 10/18/18 1655      OT LONG TERM GOAL #1   Title  Patient will demonstrate increased strength in right hand sufficient to scoop ice cream from frozen container with modified independence.    Baseline  10/18/2018: Pt continues to work to be able to scoop firm, hard ice cream. Pt requires min/mod A at this time.    Time  12    Period  Weeks    Status  On-going    Target Date  01/03/19      OT LONG TERM GOAL #2   Title  Patient will be able to make her bed with modified independence.    Baseline  10/18/2018: Pt requires assistance to manage a large comforter    Time  12  Period  Weeks    Status  On-going    Target Date  01/03/19      OT LONG TERM GOAL #4   Title  Patient will increase BUE strength by 1 mm grade to be able to self propel her wheelchair over carpet for distances greater than 300 feet    Baseline  10/18/2018: Pt is able to propel maual w/c on carpeted surfaces for ~2 mins however fatigues quickly when doing so    Time  12    Period  Weeks    Status  On-going    Target Date  01/03/19      OT LONG TERM GOAL #5   Title  Patient will demonstrate the ability to perform small buttons on shirts independently and with good speed in order to dress for school.    Baseline  10/18/2018: Pt can fasten small buttons however demonstrates decreased accuracy when under time constraints.    Time  12    Period  Weeks    Status  On-going    Target Date  01/03/19      OT LONG TERM GOAL #6   Title  Patient will donn and doff small earrings with modified independence.    Baseline   10/18/2018: Pt requires increased time and frequently drops earrings when donning/doffing earrings with small rubber backings.    Time  12    Period  Weeks    Status  Partially Met      OT LONG TERM GOAL #7   Title  Patient will demonstrate cutting vegetables with modified independence with use of adapted equipment and modified techniques    Baseline  10/18/2018: Pt is unable to safely cut hard vegetables    Time  12    Period  Weeks    Status  On-going      OT LONG TERM GOAL  #10   TITLE  Patient will demonstrate the ability to pull jeans down over AFOs with modified independence.     Baseline  10/18/2018: Pt difficulty managing skinny jeans over her AFOs without assistance.    Time  12    Period  Weeks    Status  On-going      OT LONG TERM GOAL  #11   TITLE  Patient will demonstrate moving objects in the kitchen from one surface to another with modified independence and good safety techniques.     Baseline  10/18/2018: Pt requires increased time to move bowls with liquid contents without spilling or dropping them. Pt is able to move empty dishes of various sizes to/from various surfaces with modified independence.    Time  12    Period  Weeks    Status  On-going      OT LONG TERM GOAL  #12   TITLE  Patient will demonstrate increased grip strength to be able to wring out kitchen cloth sufficiently so water does not drip onto the floor and cause a slip hazard.     Baseline  10/18/2018: Pt continues to work to consistently wring cloths out to minimize water dripping onto floor.    Time  12    Period  Weeks    Status  On-going      OT LONG TERM GOAL  #15   TITLE  Patient will complete meal preparation with minimal assist.     Baseline  10/18/2018: Pt requires mod assistance for light meal prep    Time  12    Period  Weeks  Status  On-going            Plan - 10/18/18 1644    Clinical Impression Statement  Pt. is making progress overall with bilateral grip strength, and  [facility] skills. Pt. has improved with using her hands to open containers, as well as performing self-dressing tasks more efficiently. Pt. continues to to require work on increasing independence, and efficiency with propelling her w/c on on various surfaces, making a bed with a heavy comforter, chopping vegitables during meal preparation, fastening buttons, donning/doffing earrings, and using an ice cream scooper. Pt. continues to work towards increasing independence at home, on college campus, and when travelling.     Occupational Profile and client history currently impacting functional performance  decreased hand strength, balance and coordination.    Occupational performance deficits (Please refer to evaluation for details):  ADL's;IADL's;Leisure;Education    Rehab Potential  Good    OT Frequency  1x / week    OT Duration  12 weeks    OT Treatment/Interventions  Self-care/ADL training;DME and/or AE instruction;Therapeutic activities;Therapeutic exercise;Neuromuscular education;Patient/family education    Plan  Occupational Therapy 1 x per week for ADL and strenghtening.    Consulted and Agree with Plan of Care  Patient    Family Member Consulted  mom       Patient will benefit from skilled therapeutic intervention in order to improve the following deficits and impairments:  Impaired UE functional use, Difficulty walking, Decreased strength, Decreased coordination, Decreased endurance, Decreased balance  Visit Diagnosis: Muscle weakness (generalized)  Other lack of coordination    Problem List There are no active problems to display for this patient.   Harrel Carina, MS, OTR/L 10/18/2018, 5:00 PM  [place] MAIN [facility] SERVICES [address] [place], [place], [address] Phone: [phone]   Fax:  [phone]  Name: Michelle Randall MRN: [id number] Date of Birth: [birth year]

## 2018-10-25 ENCOUNTER — Encounter: Payer: Self-pay | Admitting: Occupational Therapy

## 2018-10-25 ENCOUNTER — Ambulatory Visit: Payer: Medicaid Other

## 2018-10-25 ENCOUNTER — Ambulatory Visit: Payer: Medicaid Other | Admitting: Occupational Therapy

## 2018-10-25 DIAGNOSIS — R262 Difficulty in walking, not elsewhere classified: Secondary | ICD-10-CM

## 2018-10-25 DIAGNOSIS — M6281 Muscle weakness (generalized): Secondary | ICD-10-CM

## 2018-10-25 DIAGNOSIS — R278 Other lack of coordination: Secondary | ICD-10-CM

## 2018-10-25 NOTE — Therapy (Signed)
[place] MAIN [facility] SERVICES [address] [place], [place], [address] Phone: [phone]   Fax:  [phone]  Physical Therapy Treatment  Patient Details  Name: Michelle Randall MRN: [id number] Date of Birth: 1996/10/26 No data recorded  Encounter Date: 10/25/2018  PT End of Session - 10/25/18 1526    Visit Number  149    Number of Visits  162    Date for PT Re-Evaluation  01/17/19    Authorization Type  12/12    Authorization Time Period  12/12 visits authorization 9/25-12/17     PT Start Time  1535    PT Stop Time  1615    PT Time Calculation (min)  40 min    Equipment Utilized During Treatment  Gait belt    Activity Tolerance  Patient tolerated treatment well    Behavior During Therapy  WFL for tasks assessed/performed       Past Medical History:  Diagnosis Date  . Neurogenic bladder   . Neurogenic bowel   . S/P VP shunt   . Spina bifida [facility])     Past Surgical History:  Procedure Laterality Date  . VENTRICULOPERITONEAL SHUNT      There were no vitals filed for this visit.     Subjective Assessment - 10/25/18 1526    Subjective  Patient states that she is doing well and has no significant changes to report since her last visit. She finished her Biology exam this morning and has 2 more days of final exams. No specific questions or concerns currently    Limitations  Walking;Standing;House hold activities;Other (comment)    How long can you stand comfortably?  require use of bilateral crutches    How long can you walk comfortably?  dependent upon terrain    Patient Stated Goals  Patient wants to be able to negotiate uneven terrain at school, cross thresholds in home environment, and negotiate steps in household     Currently in Pain?  No/denies         [facility] PT Assessment - 10/25/18 1602      Berg Balance Test   Sit to Stand  Able to stand without using hands and stabilize independently    Standing Unsupported   Able to stand safely 2 minutes    Sitting with Back Unsupported but Feet Supported on Floor or Stool  Able to sit safely and securely 2 minutes    Stand to Sit  Sits safely with minimal use of hands    Transfers  Able to transfer safely, definite need of hands    Standing Unsupported with Eyes Closed  Able to stand 10 seconds safely    Standing Ubsupported with Feet Together  Able to place feet together independently and stand 1 minute safely    From Standing, Reach Forward with Outstretched Arm  Can reach forward >12 cm safely (5")    From Standing Position, Pick up Object from Floor  Able to pick up shoe, needs supervision    From Standing Position, Turn to Look Behind Over each Shoulder  Looks behind from both sides and weight shifts well    Turn 360 Degrees  Needs assistance while turning    Standing Unsupported, Alternately Place Feet on Step/Stool  Able to complete >2 steps/needs minimal assist    Standing Unsupported, One Foot in Front  Able to take small step independently and hold 30 seconds    Standing on One Leg  Tries to lift leg/unable  to hold 3 seconds but remains standing independently    Total Score  41            TREATMENT  Neuromuscular Re-education  Performed outcome measures with patient including BERG (41/56), 48mgait speed ( fastest: 12.7s=0.79 m/s), sit to stand without UE support x 5 and floor to standing transfers; Practice floor to stand transfers on red mat. Pt goes from long sitting to quadruped and then tall kneeling. She is able to push up to standing from tall kneeling with use of her lofstrand crutches. She is unable to perform on tile because of the angle she has to push with her lofstrands will slide on the tile. Pt educated about how to adjust the length of her crutches to shorten in order to press to standing from a more vertical angle and avoid sliding on the tile. Using this technique pt is able to come to standing.  Updated goals with patient and  discussed plan of care.      Pt demonstrates improvement in her DGI and BERG today. Her DGI improved from 14/24 initially to 17/24 today. Her BERG has gradually improved from 34/56 to 41/56. TUG, 193mait, Five Time Sit to Stand, and Six Minute Walk Test have remained relatively consistent. She is improving in her ability to perform floor to stand transfers as well which has promoted more functional independence. She had to have her VP shunt replaced in September due to malfunction which did cause some setbacks related to physical therapy. She needs continued PT services to continue improving her balance and maintain her functional strength gains. Pt will benefit from PT services to address deficits in strength, balance, and mobility in order to improve and maintain full function at home and school.                      PT Education - 10/26/18 1431    Education provided  Yes    Education Details  Plan of care    Person(s) Educated  Patient    Methods  Explanation    Comprehension  Verbalized understanding       PT Short Term Goals - 10/25/18 1527      PT SHORT TERM GOAL #1   Title  Patient will report compliance with HEP for continued strengthening and stability during functional mobility.     Baseline  HEP compliant    Time  2    Period  Weeks    Status  Achieved        PT Long Term Goals - 10/25/18 1527      PT LONG TERM GOAL #1   Title  Patient will improve Dynamic Gait Index (DGI) score to > 21/24 for meaningful improvement and low falls risk regarding dynamic walking tasks (revised from > 19/24 )    Baseline  14/24; 11/02/16: 15/24 01/28/17: 18/24 6/5: 19/24; 08/23/17 14/24,  14/24 10/11/17 14/24 12/20/17, 12/27/17/  14/24 9/6 deferred due to precautions; 10/04/18: 17/24    Time  12    Period  Weeks    Status  On-going    Target Date  01/17/19      PT LONG TERM GOAL #2   Title  Patient will increase Berg Balance score by >51/56 points to be considered a low risk  for falls for improved safety. (revised from >6 points improvement)    Baseline  11/02/16: 34/56 01/28/17: 39/56 6/5: 45/56; 08/23/2017 = 45/56, 43/56 10/11/17, 43/56 12/20/17, 12/27/17  43/56 6/20: 38/56 no UE support ; 8/8: 40/56 no UE support 9/6: deferred due to precautions; 9/25: 42/56    Time  12    Period  Weeks    Status  Partially Met    Target Date  01/17/19      PT LONG TERM GOAL #3   Title  Patient will be able to transfer in and out of a large car with  a high seat with CGA  to improve ability to go to school/doctor visits.     Baseline  Patient requires min A for transfer into large SUV unless it has a step rail on the side; 5/15: pt requires assist with a really high car but can get out of an SUV without assist, typically needs more assits getting in and getting in on the L side; 6/20: if ledge able to do with little to no assistance.  9/6 deferred due to precautions 9/25: if ledge is able to do with little or not assistance    Time  12    Period  Weeks    Status  Partially Met    Target Date  01/18/19      PT LONG TERM GOAL #4   Title  Patient will complete a TUG test in < 12 seconds for independent mobility and decreased fall risk     Baseline  11.45; 08/23/2017 = 13.66 sec, 13.40 12/20/17, 12/27/17 12.07 sec; 5/15: 13.42 seconds 6/20: 12 seconds, 10/04/18: 13.0s     Time  12    Period  Weeks    Status  On-going    Target Date  01/18/19      PT LONG TERM GOAL #5   Title  Patient will improve 6 minute walk distance by > 150 ft for improved return to functional community activities     Baseline  740 01/28/2017: 760f 6/5: 795  730 on 07/19/17; 790 ft on 08/23/18,  740 feet, 730 feet 12/20/17, 12/27/17 650 feet 6/20: 665 8/8: 701 9/6: deferred due to precautions 9/25: 6469f 10/11/18: 64056f  Time  12    Period  Weeks    Status  Deferred    Target Date  01/18/19      PT LONG TERM GOAL #6   Title  Patient will improve gait speed to > 1.2 m/s with least restrictive assistive device to  return to normal walking speed     Baseline  .76 m/h 01/28/2017: .87  07/19/17 0.2m52m0.77 m/s on 08/23/17, . 58 m/sec 10/11/17 .62m/42m6/20: 77 m/s  8/8: .63m/s42m: terminated due to precuations for HR 9/25: 0.48m/s,73mf-selected: 16.2s = 0.62 m/s, fastest: 14.2s = 0.70 m/s    Time  12    Period  Weeks    Status  On-going   03/21/18 target dat     PT LONG TERM GOAL #7   Title  Patient (< 60 year27old) will complete five times sit to stand test in < 10 seconds indicating an increased LE strength and improved balance     Baseline  9.45 sec; 9.63 on 08/23/17, 9.63 sec 12/27/17, 10.07 sec    Time  12    Period  Weeks    Status  Achieved   03/26/18 target date     PT LONG TEOld[place]itle  Patient will be able to ambulate on inclines and grass independenlty with LRAD    Baseline  Can ambulate over grassy inclines with decreased speed and safety  requiring need loftstrand curtches and SBA; 5/15: pt reports she has to walk slowly and feels unsteady but that this is improving6/20 : reports cautious and slow walk, able to do mod I9/6:  deffered due to precautions9/25: able to do slowly and with caution due to fear of falling, able to do mod I, 10/25/18:  able to do slowly and with caution due to fear of falling, able to do mod I,     Time  12    Period  Weeks    Status  On-going   03/21/18 target date     PT LONG TERM GOAL  #9   TITLE  Patient will be able to transfer from low chair or stool without UE support independently    Baseline  able to perform independently when not tired, but when fatigued requires support. 10/25/18: Able to perform when legs are not fatigued, mostly limited by balance;    Time  12    Period  Weeks    Status  Partially Met   03/21/18 target date     PT LONG TERM GOAL  #10   TITLE  Patient will be able to transfer from the floor to standing independently with use of LRAD    Baseline  difficult on different types of floor. Slippery floors more challenging and how long on  the floor. Only need assistance when not able to put crutch against surface. 9/25: can do mod I if immediately stands back up, requires assistance if is in position for long, more difficulty with harder surfaces; 10/25/18: able to perform on soft surfaces for loftstrand traction, learned how to shorten loftrands so it can be performed on tile without falling    Time  12    Period  Weeks    Status  Achieved   03/21/18 target date     PT LONG TERM GOAL  #11   TITLE  Patient will step up onto a 6" step 5x with AD independently to increase community mobility    Baseline  6/20: challenging to patient ascending steps, supervision ; 8/8: difficult clearing L foot /14: can step up curb independently     Time  12    Period  Weeks    Status  Achieved   01/03/18 target date           Plan - 10/25/18 1526    Clinical Impression Statement  Pt demonstrates improvement in her DGI and BERG today. Her DGI improved from 14/24 initially to 17/24 today. Her BERG has gradually improved from 34/56 to 41/56. TUG, 18mgait, Five Time Sit to Stand, and Six Minute Walk Test have remained relatively consistent. She is improving in her ability to perform floor to stand transfers as well which has promoted more functional independence. She had to have her VP shunt replaced in September due to malfunction which did cause some setbacks related to physical therapy. She needs continued PT services to continue improving her balance and maintain her functional strength gains. Pt will benefit from PT services to address deficits in strength, balance, and mobility in order to improve and maintain full function at home and school.     Rehab Potential  Good    Clinical Impairments Affecting Rehab Potential  weakness and decreased standing balance    PT Frequency  1x / week    PT Duration  12 weeks    PT Treatment/Interventions  Therapeutic exercise;Therapeutic activities;Gait training;Balance training;Stair training;DME  Instruction;Neuromuscular re-education;Patient/family education;Functional mobility training;Passive range of  motion;Electrical Stimulation;Energy conservation;Orthotic Fit/Training;Manual techniques    PT Next Visit Plan  Continue strength/balance    PT Home Exercise Plan  added seated dynamic core UE and LE lift offs with visual cues using mirror, supine marches    Consulted and Agree with Plan of Care  Patient       Patient will benefit from skilled therapeutic intervention in order to improve the following deficits and impairments:  Abnormal gait, Decreased balance, Decreased endurance, Difficulty walking, Decreased strength, Decreased activity tolerance, Decreased coordination, Decreased mobility, Decreased range of motion, Impaired flexibility, Postural dysfunction, Improper body mechanics  Visit Diagnosis: Muscle weakness (generalized)  Difficulty in walking, not elsewhere classified     Problem List There are no active problems to display for this patient.  Phillips Grout PT, DPT, GCS  Elita Dame 10/26/2018, 3:07 PM  [place] MAIN [facility] SERVICES [address] [place], [place], [address] Phone: [phone]   Fax:  [phone]  Name: KELLAN BOEHLKE MRN: [id number] Date of Birth: 1995-12-18

## 2018-10-25 NOTE — Therapy (Signed)
[place] MAIN [facility] SERVICES [address] [place], [place], [address] Phone: [phone]   Fax:  [phone]  Occupational Therapy Treatment  Patient Details  Name: Michelle Randall MRN: [id number] Date of Birth: [birth year] No data recorded  Encounter Date: 10/25/2018  OT End of Session - 10/25/18 1453    Visit Number  141    Number of Visits  142    Date for OT Re-Evaluation  01/03/19    Authorization Type  medicaid visit 110 of 118    OT Start Time  1430    OT Stop Time  1515    OT Time Calculation (min)  45 min    Activity Tolerance  Patient tolerated treatment well    Behavior During Therapy  WFL for tasks assessed/performed       Past Medical History:  Diagnosis Date  . Neurogenic bladder   . Neurogenic bowel   . S/P VP shunt   . Spina bifida [facility])     Past Surgical History:  Procedure Laterality Date  . VENTRICULOPERITONEAL SHUNT      There were no vitals filed for this visit.  Subjective Assessment - 10/25/18 1450    Subjective   Pt. has finals this week.     Patient is accompained by:  Family member    Patient Stated Goals  To be as independent as possible at home and at school.     Currently in Pain?  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 Rest breaks were required. Pt. worked on following progressively more difficult patterns.  Pt. worked on grasping, and disconnecting resistive beads using a lateral pinch grasp, and 3pt. pinch grasp. Pt. worked on [facility] skills manipulating small [place], and placing them on a post.                        OT Education - 10/25/18 1453    Education provided  Yes    Education Details  [place] skills, POC, goals    Person(s) Educated  Patient    Methods  Explanation    Comprehension   Verbalized understanding          OT Long Term Goals - 10/18/18 1655      OT LONG TERM GOAL #1   Title  Patient will demonstrate increased strength in right hand sufficient to scoop ice cream from frozen container with modified independence.    Baseline  10/18/2018: Pt continues to work to be able to scoop firm, hard ice cream. Pt requires min/mod A at this time.    Time  12    Period  Weeks    Status  On-going    Target Date  01/03/19      OT LONG TERM GOAL #2   Title  Patient will be able to make her bed with modified independence.    Baseline  10/18/2018: Pt requires assistance to manage a large comforter    Time  12    Period  Weeks    Status  On-going    Target Date  01/03/19      OT LONG TERM GOAL #4   Title  Patient will increase BUE strength by 1 mm grade to be able to self propel her wheelchair over carpet for  distances greater than 300 feet    Baseline  10/18/2018: Pt is able to propel maual w/c on carpeted surfaces for ~2 mins however fatigues quickly when doing so    Time  12    Period  Weeks    Status  On-going    Target Date  01/03/19      OT LONG TERM GOAL #5   Title  Patient will demonstrate the ability to perform small buttons on shirts independently and with good speed in order to dress for school.    Baseline  10/18/2018: Pt can fasten small buttons however demonstrates decreased accuracy when under time constraints.    Time  12    Period  Weeks    Status  On-going    Target Date  01/03/19      OT LONG TERM GOAL #6   Title  Patient will donn and doff small earrings with modified independence.    Baseline  10/18/2018: Pt requires increased time and frequently drops earrings when donning/doffing earrings with small rubber backings.    Time  12    Period  Weeks    Status  Partially Met      OT LONG TERM GOAL #7   Title  Patient will demonstrate cutting vegetables with modified independence with use of adapted equipment and modified techniques     Baseline  10/18/2018: Pt is unable to safely cut hard vegetables    Time  12    Period  Weeks    Status  On-going      OT LONG TERM GOAL  #10   TITLE  Patient will demonstrate the ability to pull jeans down over AFOs with modified independence.     Baseline  10/18/2018: Pt difficulty managing skinny jeans over her AFOs without assistance.    Time  12    Period  Weeks    Status  On-going      OT LONG TERM GOAL  #11   TITLE  Patient will demonstrate moving objects in the kitchen from one surface to another with modified independence and good safety techniques.     Baseline  10/18/2018: Pt requires increased time to move bowls with liquid contents without spilling or dropping them. Pt is able to move empty dishes of various sizes to/from various surfaces with modified independence.    Time  12    Period  Weeks    Status  On-going      OT LONG TERM GOAL  #12   TITLE  Patient will demonstrate increased grip strength to be able to wring out kitchen cloth sufficiently so water does not drip onto the floor and cause a slip hazard.     Baseline  10/18/2018: Pt continues to work to consistently wring cloths out to minimize water dripping onto floor.    Time  12    Period  Weeks    Status  On-going      OT LONG TERM GOAL  #15   TITLE  Patient will complete meal preparation with minimal assist.     Baseline  10/18/2018: Pt requires mod assistance for light meal prep    Time  12    Period  Weeks    Status  On-going            Plan - 10/25/18 1454    Clinical Impression Statement  Pt. presents with limited [facility] skills,  bilateral hand coordination, and hand function skills while UEs are sustained in elevation. Pt. worked  on improving UE functional reach requiring cues, and rest breaks with sustained reaching overhead. Pt. continues to work on these skills in order to improve independence with ADLIADLs tasks that require sustained overhead reach.    Occupational Profile and client history  currently impacting functional performance  decreased hand strength, balance and coordination.    Occupational performance deficits (Please refer to evaluation for details):  ADL's;IADL's;Leisure;Education    Rehab Potential  Good    OT Frequency  1x / week    OT Duration  12 weeks    OT Treatment/Interventions  Self-care/ADL training;DME and/or AE instruction;Therapeutic activities;Therapeutic exercise;Neuromuscular education;Patient/family education    Plan  Occupational Therapy 1 x per week for ADL and strenghtening.    OT Home Exercise Plan  try elastic shoe laces and new tech for bra donning and doffing    Consulted and Agree with Plan of Care  Patient    Family Member Consulted  mom       Patient will benefit from skilled therapeutic intervention in order to improve the following deficits and impairments:  Impaired UE functional use, Difficulty walking, Decreased strength, Decreased coordination, Decreased endurance, Decreased balance  Visit Diagnosis: Muscle weakness (generalized)  Other lack of coordination    Problem List There are no active problems to display for this patient.   Harrel Carina, MS, OTR/L 10/25/2018, 4:12 PM  [place] MAIN [facility] SERVICES [address] [place], [place], [address] Phone: [phone]   Fax:  [phone]  Name: Michelle Randall MRN: [id number] Date of Birth: 1996/01/27

## 2018-10-26 NOTE — Addendum Note (Signed)
Addended by: Ria CommentHUPRICH, Colisha Redler D on: 10/26/2018 03:16 PM   Modules accepted: Orders

## 2018-10-31 ENCOUNTER — Ambulatory Visit: Payer: Medicaid Other

## 2018-10-31 ENCOUNTER — Encounter: Payer: Medicaid Other | Admitting: Occupational Therapy

## 2018-11-01 ENCOUNTER — Encounter: Payer: Medicaid Other | Admitting: Occupational Therapy

## 2018-11-01 ENCOUNTER — Ambulatory Visit: Payer: Medicaid Other

## 2018-11-20 ENCOUNTER — Encounter: Payer: Medicaid Other | Admitting: Occupational Therapy

## 2018-11-20 ENCOUNTER — Ambulatory Visit: Payer: Medicaid Other

## 2018-11-21 ENCOUNTER — Ambulatory Visit: Payer: Medicaid Other | Admitting: Occupational Therapy

## 2018-11-22 ENCOUNTER — Ambulatory Visit: Payer: Medicaid Other | Attending: Student

## 2018-11-22 ENCOUNTER — Ambulatory Visit: Payer: Medicaid Other | Admitting: Occupational Therapy

## 2018-11-22 ENCOUNTER — Encounter: Payer: Self-pay | Admitting: Occupational Therapy

## 2018-11-22 DIAGNOSIS — R262 Difficulty in walking, not elsewhere classified: Secondary | ICD-10-CM | POA: Diagnosis present

## 2018-11-22 DIAGNOSIS — R278 Other lack of coordination: Secondary | ICD-10-CM

## 2018-11-22 DIAGNOSIS — M6281 Muscle weakness (generalized): Secondary | ICD-10-CM

## 2018-11-22 NOTE — Therapy (Signed)
[place] MAIN [facility] SERVICES [address] [place], [place], [address] Phone: [phone]   Fax:  [phone]  Physical Therapy Treatment  Patient Details  Name: Michelle Randall MRN: [id number] Date of Birth: [birth year] No data recorded  Encounter Date: 11/22/2018  PT End of Session - 11/22/18 1639    Visit Number  150    Number of Visits  162    Date for PT Re-Evaluation  01/17/19    Authorization Type  1/3    Authorization Time Period  1/3 visits authorization 1/6-1/26     PT Start Time  1600    PT Stop Time  1645    PT Time Calculation (min)  45 min    Equipment Utilized During Treatment  Gait belt    Activity Tolerance  Patient tolerated treatment well    Behavior During Therapy  WFL for tasks assessed/performed       Past Medical History:  Diagnosis Date  . Neurogenic bladder   . Neurogenic bowel   . S/P VP shunt   . Spina bifida [facility])     Past Surgical History:  Procedure Laterality Date  . VENTRICULOPERITONEAL SHUNT      There were no vitals filed for this visit.  Subjective Assessment - 11/22/18 1639    Subjective  Patient states that she is doing well and has no significant changes to report over the holidays. Most of her family is currently in [place] but she has stayed home with her Father and one of her siblings. She started her winter term today. No specific questions or concerns at this time. No pain.     Limitations  Walking;Standing;House hold activities;Other (comment)    How long can you stand comfortably?  require use of bilateral crutches    How long can you walk comfortably?  dependent upon terrain    Patient Stated Goals  Patient wants to be able to negotiate uneven terrain at school, cross thresholds in home environment, and negotiate steps in household     Currently in Pain?  No/denies            TREATMENT   Therapeutic Exercise: Supine SLR x 15 bilateral; Supine bridge x 15; Resisted  isometric lumbar rotation 3s hold x 10 bilateral; Supinehip abduction x 15 bilateral with gentle manual resistance; Supine hip adduction x 15 bilateral with gentle manual resistance; Hooklying hip adductor stretch 30s hold x 2 bilateral; Manually resisted leg press 2 x 10 bilateral; Chest lift/hundreds prep x 10 Hundreds (Pilates pulse); Hooklying clams with gentle manual resistance x 10; Sit to stand without UE support x 10;   Pt educated throughout session about proper posture and technique with exercises. Improved exercise technique, movement at target joints, use of target muscles after min to mod verbal, visual, tactile cues.   Patientcontinues topresent to clinic with excellent motivation. She continues to benefit from supine strengthening. She reports no significant changes since last visit and does not demonstrate any loss in function or strength today. She is able to complete all exercises as instructed with verbal and tactile cues.Pt encouraged to continue HEP.Shewill benefit from continued skilled therapeutic intervention to address deficits in balance, strength, and mobility as well as improve overall QOL.                       PT Short Term Goals - 10/25/18 1527      PT SHORT TERM GOAL #1   Title  Patient will report compliance with HEP for continued strengthening and stability during functional mobility.     Baseline  HEP compliant    Time  2    Period  Weeks    Status  Achieved        PT Long Term Goals - 10/25/18 1527      PT LONG TERM GOAL #1   Title  Patient will improve Dynamic Gait Index (DGI) score to > 21/24 for meaningful improvement and low falls risk regarding dynamic walking tasks (revised from > 19/24 )    Baseline  14/24; 11/02/16: 15/24 01/28/17: 18/24 6/5: 19/24; 08/23/17 14/24,  14/24 10/11/17 14/24 12/20/17, 12/27/17/  14/24 9/6 deferred due to precautions; 10/04/18: 17/24    Time  12    Period  Weeks    Status  On-going     Target Date  01/17/19      PT LONG TERM GOAL #2   Title  Patient will increase Berg Balance score by >51/56 points to be considered a low risk for falls for improved safety. (revised from >6 points improvement)    Baseline  11/02/16: 34/56 01/28/17: 39/56 6/5: 45/56; 08/23/2017 = 45/56, 43/56 10/11/17, 43/56 12/20/17, 12/27/17  43/56 6/20: 38/56 no UE support ; 8/8: 40/56 no UE support 9/6: deferred due to precautions; 9/25: 42/56    Time  12    Period  Weeks    Status  Partially Met    Target Date  01/17/19      PT LONG TERM GOAL #3   Title  Patient will be able to transfer in and out of a large car with  a high seat with CGA  to improve ability to go to school/doctor visits.     Baseline  Patient requires min A for transfer into large SUV unless it has a step rail on the side; 5/15: pt requires assist with a really high car but can get out of an SUV without assist, typically needs more assits getting in and getting in on the L side; 6/20: if ledge able to do with little to no assistance.  9/6 deferred due to precautions 9/25: if ledge is able to do with little or not assistance    Time  12    Period  Weeks    Status  Partially Met    Target Date  01/18/19      PT LONG TERM GOAL #4   Title  Patient will complete a TUG test in < 12 seconds for independent mobility and decreased fall risk     Baseline  11.45; 08/23/2017 = 13.66 sec, 13.40 12/20/17, 12/27/17 12.07 sec; 5/15: 13.42 seconds 6/20: 12 seconds, 10/04/18: 13.0s     Time  12    Period  Weeks    Status  On-going    Target Date  01/18/19      PT LONG TERM GOAL #5   Title  Patient will improve 6 minute walk distance by > 150 ft for improved return to functional community activities     Baseline  740 01/28/2017: 773f 6/5: 795  730 on 07/19/17; 790 ft on 08/23/18,  740 feet, 730 feet 12/20/17, 12/27/17 650 feet 6/20: 665 8/8: 701 9/6: deferred due to precautions 9/25: 642f 10/11/18: 64070f  Time  12    Period  Weeks    Status  Deferred     Target Date  01/18/19      PT LONG TERM GOAL #6   Title  Patient will improve gait speed to > 1.2 m/s with least restrictive assistive device to return to normal walking speed     Baseline  .76 m/h 01/28/2017: .87  07/19/17 0.58ms; 0.77 m/s on 08/23/17, . 58 m/sec 10/11/17 .654mec 6/20: 77 m/s  8/8: .771m9/6: terminated due to precuations for HR 9/25: 0.35m18mself-selected: 16.2s = 0.62 m/s, fastest: 14.2s = 0.70 m/s    Time  12    Period  Weeks    Status  On-going   03/21/18 target dat     PT LONG TERM GOAL #7   Title  Patient (< 60 y80rs old) will complete five times sit to stand test in < 10 seconds indicating an increased LE strength and improved balance     Baseline  9.45 sec; 9.63 on 08/23/17, 9.63 sec 12/27/17, 10.07 sec    Time  12    Period  Weeks    Status  Achieved   03/26/18 target date     [place]  Title  Patient will be able to ambulate on inclines and grass independenlty with LRAD    Baseline  Can ambulate over grassy inclines with decreased speed and safety requiring need loftstrand curtches and SBA; 5/15: pt reports she has to walk slowly and feels unsteady but that this is improving6/20 : reports cautious and slow walk, able to do mod I9/6:  deffered due to precautions9/25: able to do slowly and with caution due to fear of falling, able to do mod I, 10/25/18:  able to do slowly and with caution due to fear of falling, able to do mod I,     Time  12    Period  Weeks    Status  On-going   03/21/18 target date     PT LONG TERM GOAL  #9   TITLE  Patient will be able to transfer from low chair or stool without UE support independently    Baseline  able to perform independently when not tired, but when fatigued requires support. 10/25/18: Able to perform when legs are not fatigued, mostly limited by balance;    Time  12    Period  Weeks    Status  Partially Met   03/21/18 target date     PT LONG TERM GOAL  #10   TITLE  Patient will be able to transfer from the  floor to standing independently with use of LRAD    Baseline  difficult on different types of floor. Slippery floors more challenging and how long on the floor. Only need assistance when not able to put crutch against surface. 9/25: can do mod I if immediately stands back up, requires assistance if is in position for long, more difficulty with harder surfaces; 10/25/18: able to perform on soft surfaces for loftstrand traction, learned how to shorten loftrands so it can be performed on tile without falling    Time  12    Period  Weeks    Status  Achieved   03/21/18 target date     PT LONG TERM GOAL  #11   TITLE  Patient will step up onto a 6" step 5x with AD independently to increase community mobility    Baseline  6/20: challenging to patient ascending steps, supervision ; 8/8: difficult clearing L foot /14: can step up curb independently     Time  12    Period  Weeks    Status  Achieved   01/03/18 target date  Plan - 11/22/18 1640    Clinical Impression Statement  Patientcontinues topresent to clinic with excellent motivation. She continues to benefit from supine strengthening. She reports no significant changes since last visit and does not demonstrate any loss in function or strength today. She is able to complete all exercises as instructed with verbal and tactile cues.Pt encouraged to continue HEP.Shewill benefit from continued skilled therapeutic intervention to address deficits in balance, strength, and mobility as well as improve overall QOL.    Rehab Potential  Good    Clinical Impairments Affecting Rehab Potential  weakness and decreased standing balance    PT Frequency  1x / week    PT Duration  12 weeks    PT Treatment/Interventions  Therapeutic exercise;Therapeutic activities;Gait training;Balance training;Stair training;DME Instruction;Neuromuscular re-education;Patient/family education;Functional mobility training;Passive range of motion;Electrical  Stimulation;Energy conservation;Orthotic Fit/Training;Manual techniques    PT Next Visit Plan  Continue strength/balance    PT Home Exercise Plan  added seated dynamic core UE and LE lift offs with visual cues using mirror, supine marches    Consulted and Agree with Plan of Care  Patient       Patient will benefit from skilled therapeutic intervention in order to improve the following deficits and impairments:  Abnormal gait, Decreased balance, Decreased endurance, Difficulty walking, Decreased strength, Decreased activity tolerance, Decreased coordination, Decreased mobility, Decreased range of motion, Impaired flexibility, Postural dysfunction, Improper body mechanics  Visit Diagnosis: Difficulty in walking, not elsewhere classified  Muscle weakness (generalized)     Problem List There are no active problems to display for this patient.  Phillips Grout PT, DPT, GCS  Damarious Holtsclaw 11/23/2018, 12:49 PM  [place] MAIN [facility] SERVICES [address] [place], [place], [address] Phone: [phone]   Fax:  [phone]  Name: Michelle Randall MRN: [id number] Date of Birth: 02/02/1996

## 2018-11-22 NOTE — Therapy (Signed)
[place] MAIN [facility] SERVICES [address] [place], [place], [address] Phone: [phone]   Fax:  [phone]  Occupational Therapy Treatment  Patient Details  Name: Michelle Randall MRN: [id number] Date of Birth: 1995-12-02 No data recorded  Encounter Date: 11/22/2018  OT End of Session - 11/22/18 1740    Visit Number  142    Number of Visits  166    Authorization Type  medicaid visit 111 of 52    OT Start Time  1645    OT Stop Time  1730    OT Time Calculation (min)  45 min    Activity Tolerance  Patient tolerated treatment well    Behavior During Therapy  WFL for tasks assessed/performed       Past Medical History:  Diagnosis Date  . Neurogenic bladder   . Neurogenic bowel   . S/P VP shunt   . Spina bifida [facility])     Past Surgical History:  Procedure Laterality Date  . VENTRICULOPERITONEAL SHUNT      There were no vitals filed for this visit.  Subjective Assessment - 11/22/18 1707    Subjective   Pt. had her first class.    Patient is accompained by:  Family member    Patient Stated Goals  To be as independent as possible at home and at school.     Currently in Pain?  No/denies      OT TREATMENT    Neuro muscular re-education:  Pt. worked on grasping one inch resistive cubes alternating thumb opposition to the tip of the 2nd through 5th digits. The board was positioned at a vertical angle. Pt. worked on pressing them back into place while isolating her 2nd through 5th digits. Pt. Required verbal cues, and visual demonstration. Pt. Worked on grasping small 1/2" pegs with circular tops to challenge [facility] skills. Pt. Worked on translatory movements of the hand, moving the objects through her palm to the tip of her thumb,a nd 2nd digit in preparation for placing them in the pegboard. Pt. Worked on these skills in order to work towards manipulating small objects during ADL tasks, and manipulating buttons on clothing.  Therapeutic  Exercise:  Pt. performed bilateral gross gripping with grip strengthener. Pt. worked on sustaining grip while grasping pegs and reaching at various heights. The gripper was placed in the 3rd resistive slot with the white resistive spring. Pt. worked on pinch strengthening in the bilateral hands hand for lateral, and 3pt. pinch using yellow, red, green, and blue resistive clips. Pt. worked on placing the clips at various vertical and horizontal angles. Tactile and verbal cues were required for eliciting the desired movement. Pt. worked on these hand strengthening tasks in order to work towards being able to grip items during ADLs, and open containers.                         OT Education - 11/22/18 1721    Education provided  Yes    Education Details  [place] skills, POC, goals    Person(s) Educated  Patient    Methods  Explanation    Comprehension  Verbalized understanding    Education Details  POC     Person(s) Educated  Patient    Methods  Explanation    Comprehension  Verbalized understanding          OT Long Term Goals - 10/18/18 1655      OT LONG TERM  GOAL #1   Title  Patient will demonstrate increased strength in right hand sufficient to scoop ice cream from frozen container with modified independence.    Baseline  10/18/2018: Pt continues to work to be able to scoop firm, hard ice cream. Pt requires min/mod A at this time.    Time  12    Period  Weeks    Status  On-going    Target Date  01/03/19      OT LONG TERM GOAL #2   Title  Patient will be able to make her bed with modified independence.    Baseline  10/18/2018: Pt requires assistance to manage a large comforter    Time  12    Period  Weeks    Status  On-going    Target Date  01/03/19      OT LONG TERM GOAL #4   Title  Patient will increase BUE strength by 1 mm grade to be able to self propel her wheelchair over carpet for distances greater than 300 feet    Baseline  10/18/2018: Pt is able to  propel maual w/c on carpeted surfaces for ~2 mins however fatigues quickly when doing so    Time  12    Period  Weeks    Status  On-going    Target Date  01/03/19      OT LONG TERM GOAL #5   Title  Patient will demonstrate the ability to perform small buttons on shirts independently and with good speed in order to dress for school.    Baseline  10/18/2018: Pt can fasten small buttons however demonstrates decreased accuracy when under time constraints.    Time  12    Period  Weeks    Status  On-going    Target Date  01/03/19      OT LONG TERM GOAL #6   Title  Patient will donn and doff small earrings with modified independence.    Baseline  10/18/2018: Pt requires increased time and frequently drops earrings when donning/doffing earrings with small rubber backings.    Time  12    Period  Weeks    Status  Partially Met      OT LONG TERM GOAL #7   Title  Patient will demonstrate cutting vegetables with modified independence with use of adapted equipment and modified techniques    Baseline  10/18/2018: Pt is unable to safely cut hard vegetables    Time  12    Period  Weeks    Status  On-going      OT LONG TERM GOAL  #10   TITLE  Patient will demonstrate the ability to pull jeans down over AFOs with modified independence.     Baseline  10/18/2018: Pt difficulty managing skinny jeans over her AFOs without assistance.    Time  12    Period  Weeks    Status  On-going      OT LONG TERM GOAL  #11   TITLE  Patient will demonstrate moving objects in the kitchen from one surface to another with modified independence and good safety techniques.     Baseline  10/18/2018: Pt requires increased time to move bowls with liquid contents without spilling or dropping them. Pt is able to move empty dishes of various sizes to/from various surfaces with modified independence.    Time  12    Period  Weeks    Status  On-going      OT LONG TERM GOAL  #  12   TITLE  Patient will demonstrate increased  grip strength to be able to wring out kitchen cloth sufficiently so water does not drip onto the floor and cause a slip hazard.     Baseline  10/18/2018: Pt continues to work to consistently wring cloths out to minimize water dripping onto floor.    Time  12    Period  Weeks    Status  On-going      OT LONG TERM GOAL  #15   TITLE  Patient will complete meal preparation with minimal assist.     Baseline  10/18/2018: Pt requires mod assistance for light meal prep    Time  12    Period  Weeks    Status  On-going            Plan - 11/22/18 1741    Clinical Impression Statement  Pt. has started her winter semester public speaking class. Pt. reports being excited about returning to [employer], and is anticipating taking abnormal Pyschology in the spring semester. Pt. has been approved for additional visits for OT treatment. Pt. continues to work on improving BUE strength, and [place] skillls in order to improve overall ADL, and IADL functioning.     Occupational Profile and client history currently impacting functional performance  decreased hand strength, balance and coordination.    Occupational performance deficits (Please refer to evaluation for details):  ADL's;IADL's;Leisure;Education    Rehab Potential  Good    OT Frequency  1x / week    OT Duration  12 weeks    OT Treatment/Interventions  Self-care/ADL training;DME and/or AE instruction;Therapeutic activities;Therapeutic exercise;Neuromuscular education;Patient/family education    Plan  Occupational Therapy 1 x per week for ADL and strenghtening.    OT Home Exercise Plan  try elastic shoe laces and new tech for bra donning and doffing    Consulted and Agree with Plan of Care  Patient       Patient will benefit from skilled therapeutic intervention in order to improve the following deficits and impairments:  Impaired UE functional use, Difficulty walking, Decreased strength, Decreased coordination, Decreased endurance, Decreased  balance  Visit Diagnosis: Muscle weakness (generalized)  Other lack of coordination    Problem List There are no active problems to display for this patient.   Harrel Carina, MS, OTR/L 11/22/2018, 5:53 PM  [place] MAIN [facility] SERVICES [address] [place], [place], [address] Phone: [phone]   Fax:  [phone]  Name: Michelle Randall MRN: [id number] Date of Birth: [birth year]

## 2018-11-23 ENCOUNTER — Ambulatory Visit: Payer: Medicaid Other

## 2018-11-28 ENCOUNTER — Encounter: Payer: Medicaid Other | Admitting: Occupational Therapy

## 2018-11-29 ENCOUNTER — Ambulatory Visit: Payer: Medicaid Other

## 2018-11-29 ENCOUNTER — Encounter: Payer: Self-pay | Admitting: Occupational Therapy

## 2018-11-29 ENCOUNTER — Ambulatory Visit: Payer: Medicaid Other | Admitting: Occupational Therapy

## 2018-11-29 DIAGNOSIS — M6281 Muscle weakness (generalized): Secondary | ICD-10-CM

## 2018-11-29 DIAGNOSIS — R262 Difficulty in walking, not elsewhere classified: Secondary | ICD-10-CM | POA: Diagnosis not present

## 2018-11-29 DIAGNOSIS — R278 Other lack of coordination: Secondary | ICD-10-CM

## 2018-11-29 NOTE — Therapy (Signed)
[place] MAIN [facility] SERVICES [address] [place], [place], [address] Phone: [phone]   Fax:  [phone]  Occupational Therapy Treatment  Patient Details  Name: Michelle Randall MRN: [id number] Date of Birth: 12-07-1995 No data recorded  Encounter Date: 11/29/2018  OT End of Session - 11/29/18 1813    Visit Number  143    Number of Visits  166    Date for OT Re-Evaluation  01/03/19    Authorization Type  medicaid visit 112 of 70    OT Start Time  1705    OT Stop Time  1730    OT Time Calculation (min)  25 min    Activity Tolerance  Patient tolerated treatment well    Behavior During Therapy  WFL for tasks assessed/performed       Past Medical History:  Diagnosis Date  . Neurogenic bladder   . Neurogenic bowel   . S/P VP shunt   . Spina bifida [facility])     Past Surgical History:  Procedure Laterality Date  . VENTRICULOPERITONEAL SHUNT      There were no vitals filed for this visit.  Subjective Assessment - 11/29/18 1813    Subjective   Pt. reports her familiy is now back from [place]    Patient is accompained by:  Family member    Patient Stated Goals  To be as independent as possible at home and at school.     Currently in Pain?  No/denies      OT TREATMENT    Therapeutic Exercise:  Pt. performed Bilateral gross gripping with grip strengthener. Pt. worked on sustaining grip while grasping pegs and reaching at various heights. The Gripper was placed in the 3rd resistive slot with the white resistive spring. Pt. Tolerated it well, and required cues.  Selfcare:  Pt. worked on Warehouse manager unfolding, placing, and spreading flat sheets. Pt. required increased time to complete, and close supervision. Work simplification strategies were reviewed with the pt. Pt. worked on Stage manager, and removing one at a time from a container of earrings, and sorting the  earrings.                        OT Education - 11/29/18 1813    Education provided  Yes    Education Details  [place] skills, POC, goals    Person(s) Educated  Patient    Methods  Explanation    Comprehension  Verbalized understanding          OT Long Term Goals - 10/18/18 1655      OT LONG TERM GOAL #1   Title  Patient will demonstrate increased strength in right hand sufficient to scoop ice cream from frozen container with modified independence.    Baseline  10/18/2018: Pt continues to work to be able to scoop firm, hard ice cream. Pt requires min/mod A at this time.    Time  12    Period  Weeks    Status  On-going    Target Date  01/03/19      OT LONG TERM GOAL #2   Title  Patient will be able to make her bed with modified independence.    Baseline  10/18/2018: Pt requires assistance to manage a large comforter    Time  12    Period  Weeks    Status  On-going    Target Date  01/03/19  OT LONG TERM GOAL #4   Title  Patient will increase BUE strength by 1 mm grade to be able to self propel her wheelchair over carpet for distances greater than 300 feet    Baseline  10/18/2018: Pt is able to propel maual w/c on carpeted surfaces for ~2 mins however fatigues quickly when doing so    Time  12    Period  Weeks    Status  On-going    Target Date  01/03/19      OT LONG TERM GOAL #5   Title  Patient will demonstrate the ability to perform small buttons on shirts independently and with good speed in order to dress for school.    Baseline  10/18/2018: Pt can fasten small buttons however demonstrates decreased accuracy when under time constraints.    Time  12    Period  Weeks    Status  On-going    Target Date  01/03/19      OT LONG TERM GOAL #6   Title  Patient will donn and doff small earrings with modified independence.    Baseline  10/18/2018: Pt requires increased time and frequently drops earrings when donning/doffing earrings with small rubber  backings.    Time  12    Period  Weeks    Status  Partially Met      OT LONG TERM GOAL #7   Title  Patient will demonstrate cutting vegetables with modified independence with use of adapted equipment and modified techniques    Baseline  10/18/2018: Pt is unable to safely cut hard vegetables    Time  12    Period  Weeks    Status  On-going      OT LONG TERM GOAL  #10   TITLE  Patient will demonstrate the ability to pull jeans down over AFOs with modified independence.     Baseline  10/18/2018: Pt difficulty managing skinny jeans over her AFOs without assistance.    Time  12    Period  Weeks    Status  On-going      OT LONG TERM GOAL  #11   TITLE  Patient will demonstrate moving objects in the kitchen from one surface to another with modified independence and good safety techniques.     Baseline  10/18/2018: Pt requires increased time to move bowls with liquid contents without spilling or dropping them. Pt is able to move empty dishes of various sizes to/from various surfaces with modified independence.    Time  12    Period  Weeks    Status  On-going      OT LONG TERM GOAL  #12   TITLE  Patient will demonstrate increased grip strength to be able to wring out kitchen cloth sufficiently so water does not drip onto the floor and cause a slip hazard.     Baseline  10/18/2018: Pt continues to work to consistently wring cloths out to minimize water dripping onto floor.    Time  12    Period  Weeks    Status  On-going      OT LONG TERM GOAL  #15   TITLE  Patient will complete meal preparation with minimal assist.     Baseline  10/18/2018: Pt requires mod assistance for light meal prep    Time  12    Period  Weeks    Status  On-going            Plan - 11/29/18 1814  Clinical Impression Statement  Pt. plans to modify the number of OT visits that she will receive for 2020 as there is a combined cap of 27 visits per year for OT,a nd PT services. Pt. continues to benefit from OT  services  to work on improving ueE strength, and cooridnation skills during ADLS, and IADLs, home management tasks, and school related tasks.     Occupational Profile and client history currently impacting functional performance  decreased hand strength, balance and coordination.    Occupational performance deficits (Please refer to evaluation for details):  ADL's;IADL's;Leisure;Education    Rehab Potential  Good    OT Frequency  1x / week    OT Duration  12 weeks    OT Treatment/Interventions  Self-care/ADL training;DME and/or AE instruction;Therapeutic activities;Therapeutic exercise;Neuromuscular education;Patient/family education    Plan  Occupational Therapy 1 x per week for ADL and strenghtening.    OT Home Exercise Plan  try elastic shoe laces and new tech for bra donning and doffing    Consulted and Agree with Plan of Care  Patient       Patient will benefit from skilled therapeutic intervention in order to improve the following deficits and impairments:  Impaired UE functional use, Difficulty walking, Decreased strength, Decreased coordination, Decreased endurance, Decreased balance  Visit Diagnosis: Muscle weakness (generalized)  Other lack of coordination    Problem List There are no active problems to display for this patient.   Harrel Carina, MS, OTR/L 11/29/2018, 6:22 PM  [place] MAIN [facility] SERVICES [address] [place], [place], [address] Phone: [phone]   Fax:  [phone]  Name: Michelle Randall MRN: [id number] Date of Birth: 1996/06/11

## 2018-11-30 NOTE — Therapy (Signed)
[facility] SERVICES [address] [place], [place], [address] Phone: [phone]   Fax:  [phone]  Physical Therapy Treatment  Patient Details  Name: Michelle Randall MRN: [id number] Date of Birth: 01/07/1996 No data recorded  Encounter Date: 11/29/2018  PT End of Session - 11/30/18 2026    Visit Number  [id number]    Number of Visits  162    Date for PT Re-Evaluation  01/17/19    Authorization Type  2/3    Authorization Time Period  3 visit authorization 1/6-1/26     PT Start Time  1605    PT Stop Time  1650    PT Time Calculation (min)  45 min    Equipment Utilized During Treatment  Gait belt    Activity Tolerance  Patient tolerated treatment well    Behavior During Therapy  WFL for tasks assessed/performed       Past Medical History:  Diagnosis Date  . Neurogenic bladder   . Neurogenic bowel   . S/P VP shunt   . Spina bifida (HCC)     Past Surgical History:  Procedure Laterality Date  . VENTRICULOPERITONEAL SHUNT      There were no vitals filed for this visit.  Subjective Assessment - 11/30/18 2025    Subjective  Patient states that she is doing well and has no significant changes to report. No specific questions or concerns at this time. No pain. She continues to struggle with curbs and stairs and would like to work on these activities.     Limitations  Walking;Standing;House hold activities;Other (comment)    How long can you stand comfortably?  require use of bilateral crutches    How long can you walk comfortably?  dependent upon terrain    Patient Stated Goals  Patient wants to be able to negotiate uneven terrain at school, cross thresholds in home environment, and negotiate steps in household     Currently in Pain?  No/denies           TREATMENT   Therapeutic Exercise: Supine SLR x 15 bilateral; Supine bridge x 15; Chest lift/hundreds prep x 10 Hundreds (Pilates pulse); Hooklying clams with gentle  manual resistance x 10; Step-ups to 4" step with BUE support with SMO donned but AFO removed alternating LE x 10 each;   Neuromuscular Re-education  Assess ankle ROM and strength with LE bracing removed. Pt has bilateral dorsiflexion strength and good PROM. She lacks any plantarflexion strength; Gait training in rehab gym with lofstrand crutches with AFO removed but SMO donned. Worked on balance and step length and assessed for stability; Toe taps to 4" step with BUE support with SMO donned but AFO removed alternating LE x 10 each;   Pt educated throughout session about proper posture and technique with exercises. Improved exercise technique, movement at target joints, use of target muscles after min to mod verbal, visual, tactile cues.   Patientcontinues topresent to clinic with excellent motivation. She continues to benefit from supine strengthening. Worked on gait, toe taps, and step-ups without AFOs to challenge ankle and knee strength. Pt also better able to clear toes on step without AFOs as she has good dorsiflexion AROM. Will continue to work on LE strengthening and balance.Pt encouraged to continue HEP.Shewill benefit from continued skilled therapeutic intervention to address deficits in balance, strength, and mobility as well as improve overall QOL.                         PT Short Term Goals - 10/25/18 1527      PT SHORT TERM GOAL #1   Title  Patient will report compliance with HEP for continued strengthening and stability during functional mobility.     Baseline  HEP compliant    Time  2    Period  Weeks    Status  Achieved        PT Long Term Goals - 10/25/18 1527      PT LONG TERM GOAL #1   Title  Patient will improve Dynamic Gait Index (DGI) score to > 21/24 for meaningful improvement and low falls risk regarding dynamic walking tasks (revised from > 19/24 )    Baseline  14/24; 11/02/16: 15/24 01/28/17: 18/24 6/5: 19/24; 08/23/17 14/24,  14/24  10/11/17 14/24 12/20/17, 12/27/17/  14/24 9/6 deferred due to precautions; 10/04/18: 17/24    Time  12    Period  Weeks    Status  On-going    Target Date  01/17/19      PT LONG TERM GOAL #2   Title  Patient will increase Berg Balance score by >51/56 points to be considered a low risk for falls for improved safety. (revised from >6 points improvement)    Baseline  11/02/16: 34/56 01/28/17: 39/56 6/5: 45/56; 08/23/2017 = 45/56, 43/56 10/11/17, 43/56 12/20/17, 12/27/17  43/56 6/20: 38/56 no UE support ; 8/8: 40/56 no UE support 9/6: deferred due to precautions; 9/25: 42/56    Time  12    Period  Weeks    Status  Partially Met    Target Date  01/17/19      PT LONG TERM GOAL #3   Title  Patient will be able to transfer in and out of a large car with  a high seat with CGA  to improve ability to go to school/doctor visits.     Baseline  Patient requires min A for transfer into large SUV unless it has a step rail on the side; 5/15: pt requires assist with a really high car but can get out of an SUV without assist, typically needs more assits getting in and getting in on the L side; 6/20: if ledge able to do with little to no assistance.  9/6 deferred due to precautions 9/25: if ledge is able to do with little or not assistance    Time  12    Period  Weeks    Status  Partially Met    Target Date  01/18/19      PT LONG TERM GOAL #4   Title  Patient will complete a TUG test in < 12 seconds for independent mobility and decreased fall risk     Baseline  11.45; 08/23/2017 = 13.66 sec, 13.40 12/20/17, 12/27/17 12.07 sec; 5/15: 13.42 seconds 6/20: 12 seconds, 10/04/18: 13.0s     Time  12    Period  Weeks    Status  On-going    Target Date  01/18/19      PT LONG TERM GOAL #5   Title  Patient will improve 6 minute walk distance by > 150 ft for improved return to functional community activities     Baseline  740 01/28/2017: 790ft 6/5: 795  730 on 07/19/17; 790 ft on 08/23/18,  740 feet, 730 feet 12/20/17, 12/27/17 650  feet 6/20: 665 8/8: 701 9/6: deferred due to precautions 9/25: 640ft; 10/11/18: 640ft    Time  12    Period  Weeks    Status    Deferred    Target Date  01/18/19      PT LONG TERM GOAL #6   Title  Patient will improve gait speed to > 1.2 m/s with least restrictive assistive device to return to normal walking speed     Baseline  .76 m/h 01/28/2017: .87  07/19/17 0.69m/s; 0.77 m/s on 08/23/17, . 58 m/sec 10/11/17 .66m/sec 6/20: 77 m/s  8/8: .77m/s 9/6: terminated due to precuations for HR 9/25: 0.55m/s, self-selected: 16.2s = 0.62 m/s, fastest: 14.2s = 0.70 m/s    Time  12    Period  Weeks    Status  On-going   03/21/18 target dat     PT LONG TERM GOAL #7   Title  Patient (< 60 years old) will complete five times sit to stand test in < 10 seconds indicating an increased LE strength and improved balance     Baseline  9.45 sec; 9.63 on 08/23/17, 9.63 sec 12/27/17, 10.07 sec    Time  12    Period  Weeks    Status  Achieved   03/26/18 target date     PT LONG TERM GOAL #8   Title  Patient will be able to ambulate on inclines and grass independenlty with LRAD    Baseline  Can ambulate over grassy inclines with decreased speed and safety requiring need loftstrand curtches and SBA; 5/15: pt reports she has to walk slowly and feels unsteady but that this is improving6/20 : reports cautious and slow walk, able to do mod I9/6:  deffered due to precautions9/25: able to do slowly and with caution due to fear of falling, able to do mod I, 10/25/18:  able to do slowly and with caution due to fear of falling, able to do mod I,     Time  12    Period  Weeks    Status  On-going   03/21/18 target date     PT LONG TERM GOAL  #9   TITLE  Patient will be able to transfer from low chair or stool without UE support independently    Baseline  able to perform independently when not tired, but when fatigued requires support. 10/25/18: Able to perform when legs are not fatigued, mostly limited by balance;    Time  12     Period  Weeks    Status  Partially Met   03/21/18 target date     PT LONG TERM GOAL  #10   TITLE  Patient will be able to transfer from the floor to standing independently with use of LRAD    Baseline  difficult on different types of floor. Slippery floors more challenging and how long on the floor. Only need assistance when not able to put crutch against surface. 9/25: can do mod I if immediately stands back up, requires assistance if is in position for long, more difficulty with harder surfaces; 10/25/18: able to perform on soft surfaces for loftstrand traction, learned how to shorten loftrands so it can be performed on tile without falling    Time  12    Period  Weeks    Status  Achieved   03/21/18 target date     PT LONG TERM GOAL  #11   TITLE  Patient will step up onto a 6" step 5x with AD independently to increase community mobility    Baseline  6/20: challenging to patient ascending steps, supervision ; 8/8: difficult clearing L foot /14: can step up curb independently       Time  12    Period  Weeks    Status  Achieved   01/03/18 target date           Plan - 11/30/18 2027    Clinical Impression Statement  Patientcontinues topresent to clinic with excellent motivation. She continues to benefit from supine strengthening. Worked on gait, toe taps, and step-ups without AFOs to challenge ankle and knee strength. Pt also better able to clear toes on step without AFOs as she has good dorsiflexion AROM. Will continue to work on LE strengthening and balance.Pt encouraged to continue HEP.Shewill benefit from continued skilled therapeutic intervention to address deficits in balance, strength, and mobility as well as improve overall QOL.    Rehab Potential  Good    Clinical Impairments Affecting Rehab Potential  weakness and decreased standing balance    PT Frequency  1x / week    PT Duration  12 weeks    PT Treatment/Interventions  Therapeutic exercise;Therapeutic activities;Gait  training;Balance training;Stair training;DME Instruction;Neuromuscular re-education;Patient/family education;Functional mobility training;Passive range of motion;Electrical Stimulation;Energy conservation;Orthotic Fit/Training;Manual techniques    PT Next Visit Plan  Update goals, needs recertification from Medicaid, continue strength/balance, continue working on step-ups to pratice curb negotiation. Remove AFOs for additional LE strengthening    PT Home Exercise Plan  added seated dynamic core UE and LE lift offs with visual cues using mirror, supine marches    Consulted and Agree with Plan of Care  Patient       Patient will benefit from skilled therapeutic intervention in order to improve the following deficits and impairments:  Abnormal gait, Decreased balance, Decreased endurance, Difficulty walking, Decreased strength, Decreased activity tolerance, Decreased coordination, Decreased mobility, Decreased range of motion, Impaired flexibility, Postural dysfunction, Improper body mechanics  Visit Diagnosis: Muscle weakness (generalized)  Difficulty in walking, not elsewhere classified     Problem List There are no active problems to display for this patient.  Jason D Huprich PT, DPT, GCS  Huprich,Jason 11/30/2018, 8:34 PM  [facility] [address] [place], [place], [address] Phone: [phone]   Fax:  [phone]  Name: Michelle Randall MRN: [id number] Date of Birth: 11/08/1996   

## 2018-12-05 ENCOUNTER — Encounter: Payer: Self-pay | Admitting: Occupational Therapy

## 2018-12-05 ENCOUNTER — Ambulatory Visit: Payer: Medicaid Other | Admitting: Occupational Therapy

## 2018-12-05 DIAGNOSIS — R278 Other lack of coordination: Secondary | ICD-10-CM

## 2018-12-05 DIAGNOSIS — R262 Difficulty in walking, not elsewhere classified: Secondary | ICD-10-CM | POA: Diagnosis not present

## 2018-12-05 DIAGNOSIS — M6281 Muscle weakness (generalized): Secondary | ICD-10-CM

## 2018-12-05 NOTE — Therapy (Signed)
[place] [facility]IN [facility] SERVICES [address] [place], [place], [address] Phone: [phone]   Fax:  [phone]  Occupational Therapy Treatment  Patient Details  Name: Michelle Randall MRN: [id number] Date of Birth: [birth year] No data recorded  Encounter Date: 12/05/2018  OT End of Session - 12/05/18 1610    Visit Number  144    Number of Visits  166    Date for OT Re-Evaluation  01/03/19    Authorization Type  medicaid visit 113 of 14    OT Start Time  1600    OT Stop Time  1645    OT Time Calculation (min)  45 min    Activity Tolerance  Patient tolerated treatment well    Behavior During Therapy  WFL for tasks assessed/performed       Past Medical History:  Diagnosis Date  . Neurogenic bladder   . Neurogenic bowel   . S/P VP shunt   . Spina bifida [facility])     Past Surgical History:  Procedure Laterality Date  . VENTRICULOPERITONEAL SHUNT      There were no vitals filed for this visit.  Subjective Assessment - 12/05/18 1608    Subjective   Pt. was early for her session today.    Patient Stated Goals  To be as independent as possible at home and at school.     Currently in Pain?  No/denies      OT TREATMENT    Neuro muscular re-education:  Pt. worked on [facility] skills manipulating small screw drivers to unscrew extra small screws on glasses in preparation for being able to fix her glasses. Pt. education was provided about a HEP for [facility]. Pt. was provided with a handout. Pt. worked on [facility] skills flipping cards while alternating thumb on fingers, and fingers on thumb movements with progressively increasing speed. Pt. worked on [facility] grasping, manipulating, and sorting earring backings of varying sizes.                        OT Education - 12/05/18 1610    Education provided  Yes    Education Details  [place] skills, POC, goals    Person(s) Educated  Patient    Methods  Explanation    Comprehension  Verbalized  understanding          OT Long Term Goals - 10/18/18 1655      OT LONG TERM GOAL #1   Title  Patient will demonstrate increased strength in right hand sufficient to scoop ice cream from frozen container with modified independence.    Baseline  10/18/2018: Pt continues to work to be able to scoop firm, hard ice cream. Pt requires min/mod A at this time.    Time  12    Period  Weeks    Status  On-going    Target Date  01/03/19      OT LONG TERM GOAL #2   Title  Patient will be able to make her bed with modified independence.    Baseline  10/18/2018: Pt requires assistance to manage a large comforter    Time  12    Period  Weeks    Status  On-going    Target Date  01/03/19      OT LONG TERM GOAL #4   Title  Patient will increase BUE strength by 1 mm grade to be able to self propel her wheelchair over carpet for distances greater than  300 feet    Baseline  10/18/2018: Pt is able to propel maual w/c on carpeted surfaces for ~2 mins however fatigues quickly when doing so    Time  12    Period  Weeks    Status  On-going    Target Date  01/03/19      OT LONG TERM GOAL #5   Title  Patient will demonstrate the ability to perform small buttons on shirts independently and with good speed in order to dress for school.    Baseline  10/18/2018: Pt can fasten small buttons however demonstrates decreased accuracy when under time constraints.    Time  12    Period  Weeks    Status  On-going    Target Date  01/03/19      OT LONG TERM GOAL #6   Title  Patient will donn and doff small earrings with modified independence.    Baseline  10/18/2018: Pt requires increased time and frequently drops earrings when donning/doffing earrings with small rubber backings.    Time  12    Period  Weeks    Status  Partially Met      OT LONG TERM GOAL #7   Title  Patient will demonstrate cutting vegetables with modified independence with use of adapted equipment and modified techniques    Baseline   10/18/2018: Pt is unable to safely cut hard vegetables    Time  12    Period  Weeks    Status  On-going      OT LONG TERM GOAL  #10   TITLE  Patient will demonstrate the ability to pull jeans down over AFOs with modified independence.     Baseline  10/18/2018: Pt difficulty managing skinny jeans over her AFOs without assistance.    Time  12    Period  Weeks    Status  On-going      OT LONG TERM GOAL  #11   TITLE  Patient will demonstrate moving objects in the kitchen from one surface to another with modified independence and good safety techniques.     Baseline  10/18/2018: Pt requires increased time to move bowls with liquid contents without spilling or dropping them. Pt is able to move empty dishes of various sizes to/from various surfaces with modified independence.    Time  12    Period  Weeks    Status  On-going      OT LONG TERM GOAL  #12   TITLE  Patient will demonstrate increased grip strength to be able to wring out kitchen cloth sufficiently so water does not drip onto the floor and cause a slip hazard.     Baseline  10/18/2018: Pt continues to work to consistently wring cloths out to minimize water dripping onto floor.    Time  12    Period  Weeks    Status  On-going      OT LONG TERM GOAL  #15   TITLE  Patient will complete meal preparation with minimal assist.     Baseline  10/18/2018: Pt requires mod assistance for light meal prep    Time  12    Period  Weeks    Status  On-going            Plan - 12/05/18 1612    Clinical Impression Statement Pt. has been practicing driving her new [employer]i with modifications. Pt. reports that she needs more practice before independently driving as pt. is used to driving  a Nissan Ultima. Pt. continues to present with limited [facility] skills, and continues to work on performing [facility] skills needecd for ADLs, IADLs, and college related tasks.    Occupational Profile and client history currently impacting functional performance  decreased  hand strength, balance and coordination.    Occupational performance deficits (Please refer to evaluation for details):  ADL's;IADL's;Leisure;Education    Rehab Potential  Good    OT Frequency  1x / week    OT Duration  12 weeks    OT Treatment/Interventions  Self-care/ADL training;DME and/or AE instruction;Therapeutic activities;Therapeutic exercise;Neuromuscular education;Patient/family education    Plan  Occupational Therapy 1 x per week for ADL and strenghtening.    Consulted and Agree with Plan of Care  Patient    Family Member Consulted  mom       Patient will benefit from skilled therapeutic intervention in order to improve the following deficits and impairments:  Impaired UE functional use, Difficulty walking, Decreased strength, Decreased coordination, Decreased endurance, Decreased balance  Visit Diagnosis: Muscle weakness (generalized)  Other lack of coordination    Problem List There are no active problems to display for this patient.   Harrel Carina, MS, OTR/L 12/05/2018, 4:41 PM  [place] [facility] SERVICES [address] [place], [place], [address] Phone: [phone]   Fax:  [phone]  Name: Michelle Randall MRN: [id number] Date of Birth: [birth year]

## 2018-12-06 ENCOUNTER — Ambulatory Visit: Payer: Medicaid Other

## 2018-12-07 ENCOUNTER — Ambulatory Visit: Payer: Medicaid Other

## 2018-12-07 DIAGNOSIS — M6281 Muscle weakness (generalized): Secondary | ICD-10-CM

## 2018-12-07 DIAGNOSIS — R262 Difficulty in walking, not elsewhere classified: Secondary | ICD-10-CM | POA: Diagnosis not present

## 2018-12-07 NOTE — Therapy (Signed)
[place] [facility] [address] [place], [place], [address] Phone: [phone]   Fax:  [phone]  Physical Therapy Treatment/Goal Update  Patient Details  Name: Michelle Randall MRN: [id number] Date of Birth: 1996/11/03 No data recorded  Encounter Date: 12/07/2018  PT End of Session - 12/07/18 1649    Visit Number  [id number]    Number of Visits  162    Date for PT Re-Evaluation  01/17/19    Authorization Type  3/3, last goals 12/07/18    Authorization Time Period  3 visit authorization 1/6-1/26     PT Start Time  1650    PT Stop Time  1735    PT Time Calculation (min)  45 min    Equipment Utilized During Treatment  Gait belt    Activity Tolerance  Patient tolerated treatment well    Behavior During Therapy  WFL for tasks assessed/performed       Past Medical History:  Diagnosis Date  . Neurogenic bladder   . Neurogenic bowel   . S/P VP shunt   . Spina bifida [facility])     Past Surgical History:  Procedure Laterality Date  . VENTRICULOPERITONEAL SHUNT      There were no vitals filed for this visit.  Subjective Assessment - 12/07/18 1649    Subjective  Patient states that she is doing well and has no significant changes to report. No specific questions or concerns at this time. No pain. She continues to struggle with curbs and stairs and would like to work on these activities. She believes that physical therapy has been very helpful for her strength and balance.      Limitations  Walking;Standing;House hold activities;Other (comment)    How long can you stand comfortably?  require use of bilateral crutches    How long can you walk comfortably?  dependent upon terrain    Patient Stated Goals  Patient wants to be able to negotiate uneven terrain at school, cross thresholds in home environment, and negotiate steps in household     Currently in Pain?  No/denies         [facility] PT Assessment - 12/07/18 1711      6 Minute walk-  Post Test   6 Minute Walk Post Test  yes    Modified Borg Scale for Dyspnea  7- Severe shortness of breath or very hard breathing    Perceived Rate of Exertion (Borg)  15- Hard      6 minute walk test results    Aerobic Endurance Distance Walked  715      Standardized Balance Assessment   Standardized Balance Assessment  Berg Balance Test;Five Times Sit to Stand;10 meter walk test;Timed Up and Go Test    Five times sit to stand comments   10.2    10 Meter Walk  self-selected: 20.1s=0.50 m/s fastest: 13.0s=0.77 m/s      Berg Balance Test   Sit to Stand  Able to stand without using hands and stabilize independently    Standing Unsupported  Able to stand safely 2 minutes    Sitting with Back Unsupported but Feet Supported on Floor or Stool  Able to sit safely and securely 2 minutes    Stand to Sit  Sits safely with minimal use of hands    Transfers  Able to transfer safely, definite need of hands    Standing Unsupported with Eyes Closed  Able to stand 10 seconds safely  Standing Ubsupported with Feet Together  Able to place feet together independently and stand 1 minute safely    From Standing, Reach Forward with Outstretched Arm  Can reach forward >12 cm safely (5")    From Standing Position, Pick up Object from Gerald to pick up shoe safely and easily    From Standing Position, Turn to Look Behind Over each Shoulder  Looks behind from both sides and weight shifts well    Turn 360 Degrees  Needs assistance while turning    Standing Unsupported, Alternately Place Feet on Step/Stool  Able to complete >2 steps/needs minimal assist    Standing Unsupported, One Foot in Front  Able to take small step independently and hold 30 seconds    Standing on One Leg  Tries to lift leg/unable to hold 3 seconds but remains standing independently    Total Score  42      Timed Up and Go Test   TUG  Normal TUG    Normal TUG (seconds)  11.6        TREATMENT  Ther-ex  Performed outcome measures  with patient including: TUG: 11.6 seconds 71mgait speed: self-selected: 20.1s=0.50 m/s fastest: 13.0s=0.77 m/s BERG: 42/56    6MWT: 715 feet 5TSTS: 10.2 s Updated goals with patient and discussed plan of care;   Pt educated throughout session about proper posture and technique with outcome measures. Improved exercise technique, movement at target joints, use of target muscles after min to mod verbal, visual, tactile cues. Fatigue continuously monitored throughout. CGA provided for all balance outcome measures for safety;   Performed outcome measures and updated goals with patient today. She had demonstrated a decline in her outcome measures due to deconditioning following her VP shunt revision however her outcome measures are showing improvement today. Her 144mait speed has returned to it's previously recorded speed and her 6MWT was longer than it has been in quite a while. Her TUG is also faster today than it has been in quite a while. She demonstrates excellent maintenance of her functional balance as demonstrated by consistent scores on the BERG. She needs continued PT services to continue improving her balance and maintain her functional strength gains. Pt will benefit from PT services to address deficits in strength, balance, and mobility in order to improve and maintain full function at home and school.                         PT Short Term Goals - 12/07/18 1654      PT SHORT TERM GOAL #1   Title  Patient will report compliance with HEP for continued strengthening and stability during functional mobility.     Baseline  HEP compliant    Time  2    Period  Weeks    Status  Achieved        PT Long Term Goals - 12/07/18 1658      PT LONG TERM GOAL #1   Title  Patient will improve Dynamic Gait Index (DGI) score to > 21/24 for meaningful improvement and low falls risk regarding dynamic walking tasks (revised from > 19/24 )    Baseline  14/24; 11/02/16: 15/24  01/28/17: 18/24 6/5: 19/24; 08/23/17 14/24,  14/24 10/11/17 14/24 12/20/17, 12/27/17/  14/24 9/6 deferred due to precautions; 10/04/18: 17/24; 12/07/18: Deferred as this is a poor goals for patient given inherit limitations and scoring system    Time  12  Period  Weeks    Status  Deferred      PT LONG TERM GOAL #2   Title  Patient will increase Berg Balance score by >51/56 points to be considered a low risk for falls for improved safety. (revised from >6 points improvement)    Baseline  11/02/16: 34/56 01/28/17: 39/56 6/5: 45/56; 08/23/2017 = 45/56, 43/56 10/11/17, 43/56 12/20/17, 12/27/17  43/56 6/20: 38/56 no UE support ; 8/8: 40/56 no UE support 9/6: deferred due to precautions; 9/25: 42/56; 12/08/18: 42/56    Time  12    Period  Weeks    Status  Partially Met    Target Date  01/17/19      PT LONG TERM GOAL #3   Title  Patient will be able to transfer in and out of a large car with  a high seat with CGA  to improve ability to go to school/doctor visits.     Baseline  Patient requires min A for transfer into large SUV unless it has a step rail on the side; 5/15: pt requires assist with a really high car but can get out of an SUV without assist, typically needs more assits getting in and getting in on the L side; 6/20: if ledge able to do with little to no assistance.  9/6 deferred due to precautions 9/25: if ledge is able to do with little or not assistance; 12/07/18: if ledge is able to do with little or no assistance    Time  12    Period  Weeks    Status  Partially Met    Target Date  01/18/19      PT LONG TERM GOAL #4   Title  Patient will complete a TUG test in < 12 seconds for independent mobility and decreased fall risk     Baseline  11.45; 08/23/2017 = 13.66 sec, 13.40 12/20/17, 12/27/17 12.07 sec; 5/15: 13.42 seconds 6/20: 12 seconds, 10/04/18: 13.0s; 12/07/18: 11.6 seconds    Time  12    Period  Weeks    Status  Achieved      PT LONG TERM GOAL #5   Title  Patient will improve 6 minute walk  distance by > 150 ft for improved return to functional community activities     Baseline  740 01/28/2017: 751f 6/5: 795  730 on 07/19/17; 790 ft on 08/23/18,  740 feet, 730 feet 12/20/17, 12/27/17 650 feet 6/20: 665 8/8: 701 9/6: deferred due to precautions 9/25: 6458f 10/11/18: 64062f1/23/20: 715 feet    Time  12    Period  Weeks    Status  On-going    Target Date  01/18/19      PT LONG TERM GOAL #6   Title  Patient will improve gait speed to > 1.2 m/s with least restrictive assistive device to return to normal walking speed     Baseline  .76 m/h 01/28/2017: .87  07/19/17 0.13m80m0.77 m/s on 08/23/17, . 58 m/sec 10/11/17 .35m/29m6/20: 77 m/s  8/8: .32m/s65m: terminated due to precuations for HR 9/25: 0.37m/s,53mf-selected: 16.2s = 0.62 m/s, fastest: 14.2s = 0.70 m/s; self-selected: 20.1s=0.50 m/s fastest: 13.0s=0.77 m/s    Time  12    Period  Weeks    Status  On-going   01/18/19 target dat     PT LONG TERM GOAL #7   Title  Patient (< 60 year41old) will complete five times sit to stand test in < 10 seconds indicating an  increased LE strength and improved balance     Baseline  9.45 sec; 9.63 on 08/23/17, 9.63 sec 12/27/17, 10.07 sec; 12/08/18: 10.2 seconds    Time  12    Period  Weeks    Status  Partially Met   01/18/19 target date     PT LONG TERM GOAL #8   Title  Patient will be able to ambulate on inclines and grass independenlty with LRAD    Baseline  Can ambulate over grassy inclines with decreased speed and safety requiring need loftstrand curtches and SBA; 5/15: pt reports she has to walk slowly and feels unsteady but that this is improving6/20 : reports cautious and slow walk, able to do mod I9/6:  deffered due to precautions9/25: able to do slowly and with caution due to fear of falling, able to do mod I, 10/25/18:  able to do slowly and with caution due to fear of falling, able to do mod I; 12/07/18: still able to perform with lofstrand crutches but remains a challenge with fear of falling     Time  12    Period  Weeks    Status  On-going   03/21/18 target date     PT LONG TERM GOAL  #9   TITLE  Patient will be able to transfer from low chair or stool without UE support independently    Baseline  able to perform independently when not tired, but when fatigued requires support. 10/25/18: Able to perform when legs are not fatigued, mostly limited by balance; 12/07/18: Able to perform from regular height chair and slightly lower surface, limited by balance and better when legs are not fatigued;    Time  12    Period  Weeks    Status  Partially Met   01/18/19 target date     PT LONG TERM GOAL  #10   TITLE  Patient will be able to transfer from the floor to standing independently with use of LRAD    Baseline  difficult on different types of floor. Slippery floors more challenging and how long on the floor. Only need assistance when not able to put crutch against surface. 9/25: can do mod I if immediately stands back up, requires assistance if is in position for long, more difficulty with harder surfaces; 10/25/18: able to perform on soft surfaces for loftstrand traction, learned how to shorten loftrands so it can be performed on tile without falling    Time  12    Period  Weeks    Status  Achieved   03/21/18 target date     PT LONG TERM GOAL  #11   TITLE  Patient will step up onto a 6" step 5x with AD independently to increase community mobility    Baseline  6/20: challenging to patient ascending steps, supervision ; 8/8: difficult clearing L foot /14: can step up curb independently     Time  12    Period  Weeks    Status  Achieved   01/03/18 target date           Plan - 12/07/18 1654    Clinical Impression Statement  Performed outcome measures and updated goals with patient today. She had demonstrated a decline in her outcome measures due to deconditioning following her VP shunt revision however her outcome measures are showing improvement today. Her 44mgait speed has returned to  it's previously recorded speed and her 6MWT was longer than it has been in quite a while. Her  TUG is also faster today than it has been in quite a while. She demonstrates excellent maintenance of her functional balance as demonstrated by consistent scores on the BERG. She needs continued PT services to continue improving her balance and maintain her functional strength gains. Pt will benefit from PT services to address deficits in strength, balance, and mobility in order to improve and maintain full function at home and school.      Rehab Potential  Good    Clinical Impairments Affecting Rehab Potential  weakness and decreased standing balance    PT Frequency  1x / week    PT Duration  12 weeks    PT Treatment/Interventions  Therapeutic exercise;Therapeutic activities;Gait training;Balance training;Stair training;DME Instruction;Neuromuscular re-education;Patient/family education;Functional mobility training;Passive range of motion;Electrical Stimulation;Energy conservation;Orthotic Fit/Training;Manual techniques    PT Next Visit Plan  Continue strength/balance, continue working on step-ups to pratice curb negotiation. Remove AFOs for additional LE strengthening    PT Home Exercise Plan  added seated dynamic core UE and LE lift offs with visual cues using mirror, supine marches    Consulted and Agree with Plan of Care  Patient       Patient will benefit from skilled therapeutic intervention in order to improve the following deficits and impairments:  Abnormal gait, Decreased balance, Decreased endurance, Difficulty walking, Decreased strength, Decreased activity tolerance, Decreased coordination, Decreased mobility, Decreased range of motion, Impaired flexibility, Postural dysfunction, Improper body mechanics  Visit Diagnosis: Muscle weakness (generalized)  Difficulty in walking, not elsewhere classified     Problem List There are no active problems to display for this patient.  Phillips Grout PT, DPT, GCS  Huprich,Jason 12/08/2018, 9:51 PM  [place] [facility] [address] [place], [place], [address] Phone: [phone]   Fax:  [phone]  Name: Michelle Randall MRN: [id number] Date of Birth: 09/25/1996

## 2018-12-13 ENCOUNTER — Ambulatory Visit: Payer: Medicaid Other | Admitting: Occupational Therapy

## 2018-12-13 ENCOUNTER — Ambulatory Visit: Payer: Medicaid Other

## 2018-12-25 ENCOUNTER — Ambulatory Visit: Payer: Medicaid Other | Attending: Student

## 2018-12-25 DIAGNOSIS — R262 Difficulty in walking, not elsewhere classified: Secondary | ICD-10-CM | POA: Diagnosis present

## 2018-12-25 DIAGNOSIS — R279 Unspecified lack of coordination: Secondary | ICD-10-CM | POA: Diagnosis present

## 2018-12-25 DIAGNOSIS — R531 Weakness: Secondary | ICD-10-CM | POA: Diagnosis present

## 2018-12-25 DIAGNOSIS — R278 Other lack of coordination: Secondary | ICD-10-CM | POA: Diagnosis present

## 2018-12-25 DIAGNOSIS — M6281 Muscle weakness (generalized): Secondary | ICD-10-CM | POA: Diagnosis not present

## 2018-12-25 DIAGNOSIS — Z9181 History of falling: Secondary | ICD-10-CM | POA: Diagnosis present

## 2018-12-25 NOTE — Therapy (Signed)
[place] MAIN [facility] SERVICES [address] [place], [place], [address] Phone: [phone]   Fax:  [phone]  Physical Therapy Treatment  Patient Details  Name: Michelle Randall MRN: [id number] Date of Birth: 08-21-1996 No data recorded  Encounter Date: 12/25/2018  PT End of Session - 12/26/18 0825    Visit Number  [id number]    Number of Visits  162    Date for PT Re-Evaluation  01/17/19    Authorization Type  3/3, last goals 12/07/18    Authorization Time Period  12 units 2/10-03/18/19     PT Start Time  1645    PT Stop Time  1731    PT Time Calculation (min)  46 min    Equipment Utilized During Treatment  Gait belt    Activity Tolerance  Patient tolerated treatment well    Behavior During Therapy  WFL for tasks assessed/performed       Past Medical History:  Diagnosis Date  . Neurogenic bladder   . Neurogenic bowel   . S/P VP shunt   . Spina bifida [facility])     Past Surgical History:  Procedure Laterality Date  . VENTRICULOPERITONEAL SHUNT      There were no vitals filed for this visit.  Subjective Assessment - 12/26/18 0824    Subjective  Patient reports she will be getting new AFO's on the 13th (sunday) which hopefully will not make her feet bloody like her current ones. Reports her current AFO's are very painful and she is unable to put them on herself.     Limitations  Walking;Standing;House hold activities;Other (comment)    How long can you stand comfortably?  require use of bilateral crutches    How long can you walk comfortably?  dependent upon terrain    Patient Stated Goals  Patient wants to be able to negotiate uneven terrain at school, cross thresholds in home environment, and negotiate steps in household     Currently in Pain?  No/denies         TREATMENT     Therapeutic Exercise: Supine SLR x 15 bilateral; Supine bridge x 15;arms crossed with stabilization provided to LE's.  2000Gr weighted ball modifed abdominal  activation reach towards knees in hooklying 15x for core strengthening.  Hooklying clams with gentle manual resistance x 10;  Step-ups to 6" step with BUE support with SMO donned but AFO removed alternating LE x 10 each; verbal cueing for weight shift prior to lifting foot up, flexing trailing LE for improving clearance and decreased foot drag.      Neuromuscular Re-education  Gait training in rehab gym with lofstrand crutches with AFO removed but SMO donned. Worked on balance and step length and assessed for stability;86 ft x 2 trials Sit to stands with SMO's donned/ AFO's removed for ankle stabilization, CGA and chair in front for decreased episodes of instability Standing in // bars with SMO's donned and CGA with no UE support for ankle stabilization and challenge 3x 30 seconds    Pt educated throughout session about proper posture and technique with exercises. Improved exercise technique, movement at target joints, use of target muscles after min to mod verbal, visual, tactile cues                       PT Education - 12/26/18 0824    Education provided  Yes    Education Details  exercise technique, manual, mobility with SMO  Person(s) Educated  Patient    Methods  Explanation;Demonstration;Tactile cues;Verbal cues    Comprehension  Verbalized understanding;Returned demonstration;Verbal cues required;Tactile cues required;Need further instruction       PT Short Term Goals - 12/07/18 1654      PT SHORT TERM GOAL #1   Title  Patient will report compliance with HEP for continued strengthening and stability during functional mobility.     Baseline  HEP compliant    Time  2    Period  Weeks    Status  Achieved        PT Long Term Goals - 12/07/18 1658      PT LONG TERM GOAL #1   Title  Patient will improve Dynamic Gait Index (DGI) score to > 21/24 for meaningful improvement and low falls risk regarding dynamic walking tasks (revised from > 19/24 )     Baseline  14/24; 11/02/16: 15/24 01/28/17: 18/24 6/5: 19/24; 08/23/17 14/24,  14/24 10/11/17 14/24 12/20/17, 12/27/17/  14/24 9/6 deferred due to precautions; 10/04/18: 17/24; 12/07/18: Deferred as this is a poor goals for patient given inherit limitations and scoring system    Time  12    Period  Weeks    Status  Deferred      PT LONG TERM GOAL #2   Title  Patient will increase Berg Balance score by >51/56 points to be considered a low risk for falls for improved safety. (revised from >6 points improvement)    Baseline  11/02/16: 34/56 01/28/17: 39/56 6/5: 45/56; 08/23/2017 = 45/56, 43/56 10/11/17, 43/56 12/20/17, 12/27/17  43/56 6/20: 38/56 no UE support ; 8/8: 40/56 no UE support 9/6: deferred due to precautions; 9/25: 42/56; 12/08/18: 42/56    Time  12    Period  Weeks    Status  Partially Met    Target Date  01/17/19      PT LONG TERM GOAL #3   Title  Patient will be able to transfer in and out of a large car with  a high seat with CGA  to improve ability to go to school/doctor visits.     Baseline  Patient requires min A for transfer into large SUV unless it has a step rail on the side; 5/15: pt requires assist with a really high car but can get out of an SUV without assist, typically needs more assits getting in and getting in on the L side; 6/20: if ledge able to do with little to no assistance.  9/6 deferred due to precautions 9/25: if ledge is able to do with little or not assistance; 12/07/18: if ledge is able to do with little or no assistance    Time  12    Period  Weeks    Status  Partially Met    Target Date  01/18/19      PT LONG TERM GOAL #4   Title  Patient will complete a TUG test in < 12 seconds for independent mobility and decreased fall risk     Baseline  11.45; 08/23/2017 = 13.66 sec, 13.40 12/20/17, 12/27/17 12.07 sec; 5/15: 13.42 seconds 6/20: 12 seconds, 10/04/18: 13.0s; 12/07/18: 11.6 seconds    Time  12    Period  Weeks    Status  Achieved      PT LONG TERM GOAL #5   Title   Patient will improve 6 minute walk distance by > 150 ft for improved return to functional community activities     Baseline  740 01/28/2017:  751f 6/5: 795  730 on 07/19/17; 790 ft on 08/23/18,  740 feet, 730 feet 12/20/17, 12/27/17 650 feet 6/20: 665 8/8: 701 9/6: deferred due to precautions 9/25: 6433f 10/11/18: 6404f1/23/20: 715 feet    Time  12    Period  Weeks    Status  On-going    Target Date  01/18/19      PT LONG TERM GOAL #6   Title  Patient will improve gait speed to > 1.2 m/s with least restrictive assistive device to return to normal walking speed     Baseline  .76 m/h 01/28/2017: .87  07/19/17 0.53m51m0.77 m/s on 08/23/17, . 58 m/sec 10/11/17 .76m/41m6/20: 77 m/s  8/8: .85m/s59m: terminated due to precuations for HR 9/25: 0.35m/s,56mf-selected: 16.2s = 0.62 m/s, fastest: 14.2s = 0.70 m/s; self-selected: 20.1s=0.50 m/s fastest: 13.0s=0.77 m/s    Time  12    Period  Weeks    Status  On-going   01/18/19 target dat     PT LONG TERM GOAL #7   Title  Patient (< 61 year62old) will complete five times sit to stand test in < 10 seconds indicating an increased LE strength and improved balance     Baseline  9.45 sec; 9.63 on 08/23/17, 9.63 sec 12/27/17, 10.07 sec; 12/08/18: 10.2 seconds    Time  12    Period  Weeks    Status  Partially Met   01/18/19 target date     PT LONG TERM GOAL #8   Title  Patient will be able to ambulate on inclines and grass independenlty with LRAD    Baseline  Can ambulate over grassy inclines with decreased speed and safety requiring need loftstrand curtches and SBA; 5/15: pt reports she has to walk slowly and feels unsteady but that this is improving6/20 : reports cautious and slow walk, able to do mod I9/6:  deffered due to precautions9/25: able to do slowly and with caution due to fear of falling, able to do mod I, 10/25/18:  able to do slowly and with caution due to fear of falling, able to do mod I; 12/07/18: still able to perform with lofstrand crutches but remains a  challenge with fear of falling    Time  12    Period  Weeks    Status  On-going   03/21/18 target date     PT LONG TERM GOAL  #9   TITLE  Patient will be able to transfer from low chair or stool without UE support independently    Baseline  able to perform independently when not tired, but when fatigued requires support. 10/25/18: Able to perform when legs are not fatigued, mostly limited by balance; 12/07/18: Able to perform from regular height chair and slightly lower surface, limited by balance and better when legs are not fatigued;    Time  12    Period  Weeks    Status  Partially Met   01/18/19 target date     PT LONG TERM GOAL  #10   TITLE  Patient will be able to transfer from the floor to standing independently with use of LRAD    Baseline  difficult on different types of floor. Slippery floors more challenging and how long on the floor. Only need assistance when not able to put crutch against surface. 9/25: can do mod I if immediately stands back up, requires assistance if is in position for long, more difficulty with harder surfaces; 10/25/18: able to  perform on soft surfaces for loftstrand traction, learned how to shorten loftrands so it can be performed on tile without falling    Time  12    Period  Weeks    Status  Achieved   03/21/18 target date     PT LONG TERM GOAL  #11   TITLE  Patient will step up onto a 6" step 5x with AD independently to increase community mobility    Baseline  6/20: challenging to patient ascending steps, supervision ; 8/8: difficult clearing L foot /14: can step up curb independently     Time  12    Period  Weeks    Status  Achieved   01/03/18 target date           Plan - 12/26/18 0840    Clinical Impression Statement  Patient continues to present with excellent motivation to clinic. She is progressing with mobility with decreased stability/ increased independence from AD support (utilizing SMO's rather than full AFOs at this time). She will  receive new AFO's for prolonged mobility and independent mobility later this week. Worked on gait, toe taps, and step-ups without AFOs to challenge ankle and knee strength. Pt also better able to clear toes on step without AFOs as she has good dorsiflexion AROM. Marland Kitchen Pt will benefit from PT services to address deficits in strength, balance, and mobility in order to improve and maintain full function at home and school.     Rehab Potential  Good    Clinical Impairments Affecting Rehab Potential  weakness and decreased standing balance    PT Frequency  1x / week    PT Duration  12 weeks    PT Treatment/Interventions  Therapeutic exercise;Therapeutic activities;Gait training;Balance training;Stair training;DME Instruction;Neuromuscular re-education;Patient/family education;Functional mobility training;Passive range of motion;Electrical Stimulation;Energy conservation;Orthotic Fit/Training;Manual techniques    PT Next Visit Plan  Continue strength/balance, continue working on step-ups to pratice curb negotiation. Remove AFOs for additional LE strengthening    PT Home Exercise Plan  added seated dynamic core UE and LE lift offs with visual cues using mirror, supine marches    Consulted and Agree with Plan of Care  Patient       Patient will benefit from skilled therapeutic intervention in order to improve the following deficits and impairments:  Abnormal gait, Decreased balance, Decreased endurance, Difficulty walking, Decreased strength, Decreased activity tolerance, Decreased coordination, Decreased mobility, Decreased range of motion, Impaired flexibility, Postural dysfunction, Improper body mechanics  Visit Diagnosis: Muscle weakness (generalized)  Difficulty in walking, not elsewhere classified  Other lack of coordination     Problem List There are no active problems to display for this patient.   Janna Arch, PT, DPT    12/26/2018, 8:41 AM  [place] MAIN [facility] SERVICES [address] [place], [place], [address] Phone: [phone]   Fax:  [phone]  Name: JEIRY BIRNBAUM MRN: [id number] Date of Birth: 08-04-1996

## 2019-01-01 ENCOUNTER — Ambulatory Visit: Payer: Medicaid Other | Admitting: Physical Therapy

## 2019-01-01 ENCOUNTER — Encounter: Payer: Self-pay | Admitting: Physical Therapy

## 2019-01-01 DIAGNOSIS — R262 Difficulty in walking, not elsewhere classified: Secondary | ICD-10-CM

## 2019-01-01 DIAGNOSIS — M6281 Muscle weakness (generalized): Secondary | ICD-10-CM

## 2019-01-01 DIAGNOSIS — R278 Other lack of coordination: Secondary | ICD-10-CM

## 2019-01-01 DIAGNOSIS — R279 Unspecified lack of coordination: Secondary | ICD-10-CM

## 2019-01-01 DIAGNOSIS — Z9181 History of falling: Secondary | ICD-10-CM

## 2019-01-01 DIAGNOSIS — R531 Weakness: Secondary | ICD-10-CM

## 2019-01-01 NOTE — Therapy (Signed)
[place] MAIN [facility] SERVICES [place], [place], [address] Phone: [phone]   Fax:  [phone]  Physical Therapy Treatment  Patient Details  Name: Michelle Randall MRN: [id number] Date of Birth: Jan 01, 1996 No data recorded  Encounter Date: 01/01/2019  PT End of Session - 01/01/19 1512    Visit Number  154    Number of Visits  162    Date for PT Re-Evaluation  01/17/19    Authorization Type  3/3, last goals 12/07/18    Authorization Time Period  12 units 2/10-03/18/19     PT Start Time  0148    PT Stop Time  0230    PT Time Calculation (min)  42 min    Equipment Utilized During Treatment  Gait belt    Activity Tolerance  Patient tolerated treatment well    Behavior During Therapy  WFL for tasks assessed/performed       Past Medical History:  Diagnosis Date  . Neurogenic bladder   . Neurogenic bowel   . S/P VP shunt   . Spina bifida [facility])     Past Surgical History:  Procedure Laterality Date  . VENTRICULOPERITONEAL SHUNT      There were no vitals filed for this visit.  Subjective Assessment - 01/01/19 1512    Subjective  Patient reports that she is doing fine. No pain to report.    Patient is accompained by:  Family member    Limitations  Walking;Standing;House hold activities;Other (comment)    How long can you stand comfortably?  require use of bilateral crutches    How long can you walk comfortably?  dependent upon terrain    Patient Stated Goals  Patient wants to be able to negotiate uneven terrain at school, cross thresholds in home environment, and negotiate steps in household     Pain Onset  In the past 7 days      Treatment: Nu-step x 5 mins BUE and BLE  Quadriped UE and LE extension x 10 with 5 sec hold Tall kneeling with BUE trunk rotation x 10  Bridging x 10  Hip flexor stretch x 30 sec x 3   BLE AAROM x BLE  Sit to stand without AD x 3 from mat Patient needs cues for correct technique and  form.                             PT Education - 01/01/19 1512    Education provided  Yes    Education Details  HEP    Person(s) Educated  Patient    Methods  Explanation    Comprehension  Verbalized understanding;Returned demonstration;Need further instruction       PT Short Term Goals - 12/07/18 1654      PT SHORT TERM GOAL #1   Title  Patient will report compliance with HEP for continued strengthening and stability during functional mobility.     Baseline  HEP compliant    Time  2    Period  Weeks    Status  Achieved        PT Long Term Goals - 12/07/18 1658      PT LONG TERM GOAL #1   Title  Patient will improve Dynamic Gait Index (DGI) score to > 21/24 for meaningful improvement and low falls risk regarding dynamic walking tasks (revised from > 19/24 )    Baseline  14/24; 11/02/16: 15/24 01/28/17:  18/24 6/5: 19/24; 08/23/17 14/24,  14/24 10/11/17 14/24 12/20/17, 12/27/17/  14/24 9/6 deferred due to precautions; 10/04/18: 17/24; 12/07/18: Deferred as this is a poor goals for patient given inherit limitations and scoring system    Time  12    Period  Weeks    Status  Deferred      PT LONG TERM GOAL #2   Title  Patient will increase Berg Balance score by >51/56 points to be considered a low risk for falls for improved safety. (revised from >6 points improvement)    Baseline  11/02/16: 34/56 01/28/17: 39/56 6/5: 45/56; 08/23/2017 = 45/56, 43/56 10/11/17, 43/56 12/20/17, 12/27/17  43/56 6/20: 38/56 no UE support ; 8/8: 40/56 no UE support 9/6: deferred due to precautions; 9/25: 42/56; 12/08/18: 42/56    Time  12    Period  Weeks    Status  Partially Met    Target Date  01/17/19      PT LONG TERM GOAL #3   Title  Patient will be able to transfer in and out of a large car with  a high seat with CGA  to improve ability to go to school/doctor visits.     Baseline  Patient requires min A for transfer into large SUV unless it has a step rail on the side; 5/15: pt  requires assist with a really high car but can get out of an SUV without assist, typically needs more assits getting in and getting in on the L side; 6/20: if ledge able to do with little to no assistance.  9/6 deferred due to precautions 9/25: if ledge is able to do with little or not assistance; 12/07/18: if ledge is able to do with little or no assistance    Time  12    Period  Weeks    Status  Partially Met    Target Date  01/18/19      PT LONG TERM GOAL #4   Title  Patient will complete a TUG test in < 12 seconds for independent mobility and decreased fall risk     Baseline  11.45; 08/23/2017 = 13.66 sec, 13.40 12/20/17, 12/27/17 12.07 sec; 5/15: 13.42 seconds 6/20: 12 seconds, 10/04/18: 13.0s; 12/07/18: 11.6 seconds    Time  12    Period  Weeks    Status  Achieved      PT LONG TERM GOAL #5   Title  Patient will improve 6 minute walk distance by > 150 ft for improved return to functional community activities     Baseline  740 01/28/2017: 779f 6/5: 795  730 on 07/19/17; 790 ft on 08/23/18,  740 feet, 730 feet 12/20/17, 12/27/17 650 feet 6/20: 665 8/8: 701 9/6: deferred due to precautions 9/25: 6493f 10/11/18: 64012f1/23/20: 715 feet    Time  12    Period  Weeks    Status  On-going    Target Date  01/18/19      PT LONG TERM GOAL #6   Title  Patient will improve gait speed to > 1.2 m/s with least restrictive assistive device to return to normal walking speed     Baseline  .76 m/h 01/28/2017: .87  07/19/17 0.54m65m0.77 m/s on 08/23/17, . 58 m/sec 10/11/17 .30m/72m6/20: 77 m/s  8/8: .106m/s61m: terminated due to precuations for HR 9/25: 0.30m/s,93mf-selected: 16.2s = 0.62 m/s, fastest: 14.2s = 0.70 m/s; self-selected: 20.1s=0.50 m/s fastest: 13.0s=0.77 m/s    Time  12    Period  Weeks    Status  On-going   01/18/19 target dat     PT LONG TERM GOAL #7   Title  Patient (< 74 years old) will complete five times sit to stand test in < 10 seconds indicating an increased LE strength and improved balance      Baseline  9.45 sec; 9.63 on 08/23/17, 9.63 sec 12/27/17, 10.07 sec; 12/08/18: 10.2 seconds    Time  12    Period  Weeks    Status  Partially Met   01/18/19 target date     PT LONG TERM GOAL #8   Title  Patient will be able to ambulate on inclines and grass independenlty with LRAD    Baseline  Can ambulate over grassy inclines with decreased speed and safety requiring need loftstrand curtches and SBA; 5/15: pt reports she has to walk slowly and feels unsteady but that this is improving6/20 : reports cautious and slow walk, able to do mod I9/6:  deffered due to precautions9/25: able to do slowly and with caution due to fear of falling, able to do mod I, 10/25/18:  able to do slowly and with caution due to fear of falling, able to do mod I; 12/07/18: still able to perform with lofstrand crutches but remains a challenge with fear of falling    Time  12    Period  Weeks    Status  On-going   03/21/18 target date     PT LONG TERM GOAL  #9   TITLE  Patient will be able to transfer from low chair or stool without UE support independently    Baseline  able to perform independently when not tired, but when fatigued requires support. 10/25/18: Able to perform when legs are not fatigued, mostly limited by balance; 12/07/18: Able to perform from regular height chair and slightly lower surface, limited by balance and better when legs are not fatigued;    Time  12    Period  Weeks    Status  Partially Met   01/18/19 target date     PT LONG TERM GOAL  #10   TITLE  Patient will be able to transfer from the floor to standing independently with use of LRAD    Baseline  difficult on different types of floor. Slippery floors more challenging and how long on the floor. Only need assistance when not able to put crutch against surface. 9/25: can do mod I if immediately stands back up, requires assistance if is in position for long, more difficulty with harder surfaces; 10/25/18: able to perform on soft surfaces for  loftstrand traction, learned how to shorten loftrands so it can be performed on tile without falling    Time  12    Period  Weeks    Status  Achieved   03/21/18 target date     PT LONG TERM GOAL  #11   TITLE  Patient will step up onto a 6" step 5x with AD independently to increase community mobility    Baseline  6/20: challenging to patient ascending steps, supervision ; 8/8: difficult clearing L foot /14: can step up curb independently     Time  12    Period  Weeks    Status  Achieved   01/03/18 target date           Plan - 01/01/19 1514    Clinical Impression Statement  Patient demonstrates improved stability and strength allowing patient to perform longer duration standing interventions with  rest periods.  Patient performs intermediate standing exercises to shift weight and perform bilateral leg standing activities with SBA . Patient fatigues  with exercises requiring rest breaks intermittently. Patient will continue to benefit from skilled physical therapy to improve pain and mobility.    Rehab Potential  Good    Clinical Impairments Affecting Rehab Potential  weakness and decreased standing balance    PT Frequency  1x / week    PT Duration  12 weeks    PT Treatment/Interventions  Therapeutic exercise;Therapeutic activities;Gait training;Balance training;Stair training;DME Instruction;Neuromuscular re-education;Patient/family education;Functional mobility training;Passive range of motion;Electrical Stimulation;Energy conservation;Orthotic Fit/Training;Manual techniques    PT Next Visit Plan  Continue strength/balance, continue working on step-ups to pratice curb negotiation. Remove AFOs for additional LE strengthening    PT Home Exercise Plan  added seated dynamic core UE and LE lift offs with visual cues using mirror, supine marches    Consulted and Agree with Plan of Care  Patient       Patient will benefit from skilled therapeutic intervention in order to improve the following  deficits and impairments:  Abnormal gait, Decreased balance, Decreased endurance, Difficulty walking, Decreased strength, Decreased activity tolerance, Decreased coordination, Decreased mobility, Decreased range of motion, Impaired flexibility, Postural dysfunction, Improper body mechanics  Visit Diagnosis: Muscle weakness (generalized)  Difficulty in walking, not elsewhere classified  Other lack of coordination  Personal history of fall  Weakness generalized  Lack of coordination     Problem List There are no active problems to display for this patient.   [address], [place] DPT 01/01/2019, 3:16 PM  [place] MAIN [facility] SERVICES [place], [place], [address] Phone: [phone]   Fax:  [phone]  Name: TRENISHA LAFAVOR MRN: [id number] Date of Birth: Apr 07, 1996

## 2019-01-03 ENCOUNTER — Ambulatory Visit: Payer: Medicaid Other

## 2019-01-15 ENCOUNTER — Ambulatory Visit: Payer: Medicaid Other | Attending: Student

## 2019-01-15 DIAGNOSIS — M6281 Muscle weakness (generalized): Secondary | ICD-10-CM | POA: Diagnosis present

## 2019-01-15 DIAGNOSIS — Z9181 History of falling: Secondary | ICD-10-CM

## 2019-01-15 DIAGNOSIS — R262 Difficulty in walking, not elsewhere classified: Secondary | ICD-10-CM | POA: Insufficient documentation

## 2019-01-15 DIAGNOSIS — R278 Other lack of coordination: Secondary | ICD-10-CM | POA: Insufficient documentation

## 2019-01-15 NOTE — Therapy (Signed)
[place] MAIN [facility] SERVICES [address] [place], [place], [address] Phone: [phone]   Fax:  [phone]  Physical Therapy Treatment/ RECERT  Patient Details  Name: Michelle Randall MRN: [id number] Date of Birth: 05-06-1996 No data recorded  Encounter Date: 01/15/2019  PT End of Session - 01/15/19 1440    Visit Number  [id number]    Number of Visits  167    Date for PT Re-Evaluation  04/09/19    Authorization Type  last goals 3/2     Authorization Time Period  12 units 2/10-03/18/19     PT Start Time  1344    PT Stop Time  1430    PT Time Calculation (min)  46 min    Equipment Utilized During Treatment  Gait belt    Activity Tolerance  Patient tolerated treatment well    Behavior During Therapy  WFL for tasks assessed/performed       Past Medical History:  Diagnosis Date  . Neurogenic bladder   . Neurogenic bowel   . S/P VP shunt   . Spina bifida [facility])     Past Surgical History:  Procedure Laterality Date  . VENTRICULOPERITONEAL SHUNT      There were no vitals filed for this visit.  Subjective Assessment - 01/15/19 1437    Subjective  Patient reports she was unable to go to the conference over the weekend due to her plane being cancelled. Reports she has been compliant with HEP but has been more stiff lately due to school stress.     Patient is accompained by:  Family member    Limitations  Walking;Standing;House hold activities;Other (comment)    How long can you stand comfortably?  require use of bilateral crutches    How long can you walk comfortably?  dependent upon terrain    Patient Stated Goals  Patient wants to be able to negotiate uneven terrain at school, cross thresholds in home environment, and negotiate steps in household     Currently in Pain?  No/denies       Recert BERG: 78/93   Patient will improve gait speed to > 1.2 m/s with least restrictive assistive device to return to normal walking speed  5x STS: 9  seconds with crutches , 9 seconds without crutches  11 seconds 10 MWT: lofstrand crutches   Patient will be able to ambulate on inclines and grass independently with LRAD. Ascending harder than descending. Standard 12 to 1 ratio of incline is able to be performed.   Patient will be able to transfer from low chair or stool without UE support independently- no change    Treatment: Supine:  Hamstring stretch 2x 60 seconds each LE, popliteal angle 2x 60 seconds each LE, adductor stretch 2x 60 seconds each LE  Posterior pelvic tilts 12x 3 second holds  SLR with opp LE in hooklying 10x   LAD to RLE due to inch of apparent leg length discrepancy from muscle tightness.   Prone:  Hip flexor stretch 2 minutes each LE   [facility] PT Assessment - 01/15/19 0001      Berg Balance Test   Sit to Stand  Able to stand without using hands and stabilize independently    Standing Unsupported  Able to stand safely 2 minutes    Sitting with Back Unsupported but Feet Supported on Floor or Stool  Able to sit safely and securely 2 minutes    Stand to Sit  Sits safely with minimal  use of hands    Transfers  Able to transfer safely, minor use of hands    Standing Unsupported with Eyes Closed  Able to stand 10 seconds safely    Standing Unsupported with Feet Together  Able to place feet together independently and stand 1 minute safely    From Standing, Reach Forward with Outstretched Arm  Can reach forward >12 cm safely (5")    From Standing Position, Pick up Object from [place] to pick up shoe safely and easily    From Standing Position, Turn to Look Behind Over each Shoulder  Looks behind from both sides and weight shifts well    Turn 360 Degrees  Able to turn 360 degrees safely but slowly    Standing Unsupported, Alternately Place Feet on Step/Stool  Able to complete >2 steps/needs minimal assist    Standing Unsupported, One Foot in Front  Able to take small step independently and hold 30 seconds     Standing on One Leg  Tries to lift leg/unable to hold 3 seconds but remains standing independently    Total Score  45      .                         PT Education - 01/15/19 1438    Education provided  Yes    Education Details  exercise technique, goals, POC,     Person(s) Educated  Patient    Methods  Explanation;Demonstration;Tactile cues;Verbal cues    Comprehension  Verbalized understanding;Returned demonstration;Verbal cues required;Tactile cues required;Need further instruction       PT Short Term Goals - 01/15/19 1442      PT SHORT TERM GOAL #1   Title  Patient will report compliance with HEP for continued strengthening and stability during functional mobility.     Baseline  HEP compliant    Time  2    Period  Weeks    Status  Achieved        PT Long Term Goals - 01/15/19 1442      PT LONG TERM GOAL #1   Title  Patient will improve Dynamic Gait Index (DGI) score to > 21/24 for meaningful improvement and low falls risk regarding dynamic walking tasks (revised from > 19/24 )    Baseline  14/24; 11/02/16: 15/24 01/28/17: 18/24 6/5: 19/24; 08/23/17 14/24,  14/24 10/11/17 14/24 12/20/17, 12/27/17/  14/24 9/6 deferred due to precautions; 10/04/18: 17/24; 12/07/18: Deferred as this is a poor goals for patient given inherit limitations and scoring system    Time  12    Period  Weeks    Status  Deferred      PT LONG TERM GOAL #2   Title  Patient will increase Berg Balance score by >51/56 points to be considered a low risk for falls for improved safety. (revised from >6 points improvement)    Baseline  11/02/16: 34/56 01/28/17: 39/56 6/5: 45/56; 08/23/2017 = 45/56, 43/56 10/11/17, 43/56 12/20/17, 12/27/17  43/56 6/20: 38/56 no UE support ; 8/8: 40/56 no UE support 9/6: deferred due to precautions; 9/25: 42/56; 12/08/18: 42/56 3/2: 45/56     Time  12    Period  Weeks    Status  Partially Met    Target Date  04/09/19      PT LONG TERM GOAL #3   Title  Patient will be  able to transfer in and out of a large car with  a  high seat with CGA  to improve ability to go to school/doctor visits.     Baseline  Patient requires min A for transfer into large SUV unless it has a step rail on the side; 5/15: pt requires assist with a really high car but can get out of an SUV without assist, typically needs more assits getting in and getting in on the L side; 6/20: if ledge able to do with little to no assistance.  9/6 deferred due to precautions 9/25: if ledge is able to do with little or not assistance; 12/07/18: if ledge is able to do with little or no assistance 3/2: if ledge able to perform     Time  12    Period  Weeks    Status  Partially Met    Target Date  04/09/19      PT LONG TERM GOAL #4   Title  Patient will complete a TUG test in < 12 seconds for independent mobility and decreased fall risk     Baseline  11.45; 08/23/2017 = 13.66 sec, 13.40 12/20/17, 12/27/17 12.07 sec; 5/15: 13.42 seconds 6/20: 12 seconds, 10/04/18: 13.0s; 12/07/18: 11.6 seconds    Time  12    Period  Weeks    Status  Achieved      PT LONG TERM GOAL #5   Title  Patient will improve 6 minute walk distance by > 150 ft for improved return to functional community activities     Baseline  740 01/28/2017: 770f 6/5: 795  730 on 07/19/17; 790 ft on 08/23/18,  740 feet, 730 feet 12/20/17, 12/27/17 650 feet 6/20: 665 8/8: 701 9/6: deferred due to precautions 9/25: 6472f 10/11/18: 64069f1/23/20: 715 feet 3/2: will perform next session with new AFO's    Time  12    Period  Weeks    Status  On-going      PT LONG TERM GOAL #6   Title  Patient will improve gait speed to > 1.2 m/s with least restrictive assistive device to return to normal walking speed     Baseline  .76 m/h 01/28/2017: .87  07/19/17 0.42m65m0.77 m/s on 08/23/17, . 58 m/sec 10/11/17 .29m/34m6/20: 77 m/s  8/8: .15m/s42m: terminated due to precuations for HR 9/25: 0.53m/s,36mf-selected: 16.2s = 0.62 m/s, fastest: 14.2s = 0.70 m/s; self-selected:  20.1s=0.50 m/s fastest: 13.0s=0.77 m/s    Time  12    Period  Weeks    Status  On-going   04/09/19 target date     PT LONG TERM GOAL #7   Title  Patient (< 60 year61old) will complete five times sit to stand test in < 10 seconds indicating an increased LE strength and improved balance     Baseline  9.45 sec; 9.63 on 08/23/17, 9.63 sec 12/27/17, 10.07 sec; 12/08/18: 10.2 seconds 3/2: 9 seconds no UE support    Time  12    Period  Weeks    Status  Achieved   01/18/19 target date     PT LONG TERM GOAL #8   Title  Patient will be able to ambulate on inclines and grass independenlty with LRAD    Baseline  Can ambulate over grassy inclines with decreased speed and safety requiring need loftstrand curtches and SBA; 5/15: pt reports she has to walk slowly and feels unsteady but that this is improving6/20 : reports cautious and slow walk, able to do mod I9/6:  deffered due to precautions9/25: able to do slowly  and with caution due to fear of falling, able to do mod I, 10/25/18:  able to do slowly and with caution due to fear of falling, able to do mod I; 12/07/18: still able to perform with lofstrand crutches but remains a challenge with fear of falling 3/2: uphill more challenging than downhill, grass continues to be a challenge     Time  12    Period  Weeks    Status  On-going   04/09/19 target date     PT LONG TERM GOAL  #9   TITLE  Patient will be able to transfer from low chair or stool without UE support independently    Baseline  able to perform independently when not tired, but when fatigued requires support. 10/25/18: Able to perform when legs are not fatigued, mostly limited by balance; 12/07/18: Able to perform from regular height chair and slightly lower surface, limited by balance and better when legs are not fatigued; 3/2: limited when LE's are <90 degree angle    Time  12    Period  Weeks    Status  Partially Met   04/09/19 target date     PT LONG TERM GOAL  #10   TITLE  Patient will be  able to transfer from the floor to standing independently with use of LRAD    Baseline  difficult on different types of floor. Slippery floors more challenging and how long on the floor. Only need assistance when not able to put crutch against surface. 9/25: can do mod I if immediately stands back up, requires assistance if is in position for long, more difficulty with harder surfaces; 10/25/18: able to perform on soft surfaces for loftstrand traction, learned how to shorten loftrands so it can be performed on tile without falling    Time  12    Period  Weeks    Status  Achieved   03/21/18 target date     PT LONG TERM GOAL  #11   TITLE  Patient will step up onto a 6" step 5x with AD independently to increase community mobility    Baseline  6/20: challenging to patient ascending steps, supervision ; 8/8: difficult clearing L foot /14: can step up curb independently     Time  12    Period  Weeks    Status  Achieved   01/03/18 target date           Plan - 01/15/19 1450    Clinical Impression Statement  Patient presents to physical therapy with good motivation and ready for outcome measure performance. The 6 MWT will be performed next session with patient's new AFO's due to current AFOs being too painful to ambulate prolonged distances with. Patient has met her 5x STS goal without UE support. Patient demonstrates improved stability with BERG increasing to 45/56.  Patient's condition has the potential to improve in response to therapy. Maximum improvement is yet to be obtained. The anticipated improvement is attainable and reasonable in a generally predictable time. Pt will benefit from PT services to address deficits in strength, balance, and mobility in order to improve and maintain full function at home and school.     Rehab Potential  Good    Clinical Impairments Affecting Rehab Potential  weakness and decreased standing balance    PT Frequency  1x / week    PT Duration  12 weeks    PT  Treatment/Interventions  Therapeutic exercise;Therapeutic activities;Gait training;Balance training;Stair training;DME Instruction;Neuromuscular re-education;Patient/family education;Functional mobility training;Passive  range of motion;Electrical Stimulation;Energy conservation;Orthotic Fit/Training;Manual techniques    PT Next Visit Plan  Continue strength/balance, continue working on step-ups to pratice curb negotiation. Remove AFOs for additional LE strengthening    PT Home Exercise Plan  added seated dynamic core UE and LE lift offs with visual cues using mirror, supine marches    Consulted and Agree with Plan of Care  Patient       Patient will benefit from skilled therapeutic intervention in order to improve the following deficits and impairments:  Abnormal gait, Decreased balance, Decreased endurance, Difficulty walking, Decreased strength, Decreased activity tolerance, Decreased coordination, Decreased mobility, Decreased range of motion, Impaired flexibility, Postural dysfunction, Improper body mechanics  Visit Diagnosis: Muscle weakness (generalized)  Difficulty in walking, not elsewhere classified  Other lack of coordination  Personal history of fall     Problem List There are no active problems to display for this patient.  Janna Arch, PT, DPT   01/15/2019, 2:51 PM  [place] MAIN [facility] SERVICES [address] [place], [place], [address] Phone: [phone]   Fax:  [phone]  Name: Michelle Randall MRN: [id number] Date of Birth: Apr 28, 1996

## 2019-01-24 ENCOUNTER — Other Ambulatory Visit: Payer: Self-pay

## 2019-01-24 ENCOUNTER — Ambulatory Visit: Payer: Medicaid Other

## 2019-01-24 DIAGNOSIS — Z9181 History of falling: Secondary | ICD-10-CM

## 2019-01-24 DIAGNOSIS — R278 Other lack of coordination: Secondary | ICD-10-CM

## 2019-01-24 DIAGNOSIS — M6281 Muscle weakness (generalized): Secondary | ICD-10-CM

## 2019-01-24 DIAGNOSIS — R262 Difficulty in walking, not elsewhere classified: Secondary | ICD-10-CM

## 2019-01-24 NOTE — Therapy (Addendum)
[place] [facility]IN [facility] SERVICES [address] [place], [place], [address] Phone: [phone]   Fax:  [phone]  Physical Therapy Treatment  Patient Details  Name: Michelle Randall MRN: [id number] Date of Birth: 10-Feb-1996 No data recorded  Encounter Date: 01/24/2019  PT End of Session - 01/24/19 1651    Visit Number  [id number]    Number of Visits  167    Date for PT Re-Evaluation  04/09/19    Authorization Type  last goals 3/2     Authorization Time Period  12 units 2/10-03/18/19     Authorization - Visit Number  4    Authorization - Number of Visits  12    PT Start Time  9741    PT Stop Time  1542    PT Time Calculation (min)  43 min    Equipment Utilized During Treatment  Gait belt    Activity Tolerance  Patient tolerated treatment well    Behavior During Therapy  WFL for tasks assessed/performed       Past Medical History:  Diagnosis Date  . Neurogenic bladder   . Neurogenic bowel   . S/P VP shunt   . Spina bifida [facility])     Past Surgical History:  Procedure Laterality Date  . VENTRICULOPERITONEAL SHUNT      There were no vitals filed for this visit.  Subjective Assessment - 01/24/19 1649    Subjective  Patient reports that she is doing well today. She notes that she has an appointment for her new AFOs tomorrow and will hopefully have them for her next appointment. She denies pain today. Experienced one 'tumble' at home when there was a puddle of water and her crutch slipped on it. Patient was able to catch herself and remain upright and was ultimately uninjured with the exception of a very minor bruise on her L arm.    Patient is accompained by:  Family member    Limitations  Walking;Standing;House hold activities;Other (comment)    How long can you stand comfortably?  require use of bilateral crutches    How long can you walk comfortably?  dependent upon terrain    Patient Stated Goals  Patient wants to be able to negotiate uneven  terrain at school, cross thresholds in home environment, and negotiate steps in household     Currently in Pain?  No/denies       Therapeutic Exercise: Forward step-ups onto 6" step with moderate use of BUE in // bars to assist with performance and for balance/stability; x8 each LE with SMOs donned (no AFOs); CGA; patient benefits from intermittent verbal cues to push through legs as opposed to arms; no LOB  2000Gr weighted ball mini oblique twists for core and trunk stabilization in seated 2000Gr vertical ball lifts in seated for core and trunk stabilization in seated  Supine:             Hamstring stretch 1x 60 seconds each LE  Popliteal angle 1x 60 seconds each LE  Adductor stretch 1x 60 seconds each LE     Neuromuscular Re-education  Gait training in rehab gym with loftstrand crutches with AFOs removed but SMOs donned. Worked on balance and step length and assessed for stability; 86 ft x 1 trial; CGA, no LOB  Pendulum stepping in // bars wearing SMOs (no AFOs) x10 each side for ankle stabilization and to promote weight shifting/step length; CGA; requires use of BUE in // bars for balance/stability; patient benefits  from intermittent verbal cues to take larger step backwards; no LOB  Alternating forward taps onto 6" step x10 each LE wearing SMOs (no AFOs); CGA; requires use of BUE in // bars for balance/stability; requires intermittent verbal cues to promote weight shift; no LOB                       PT Education - 01/24/19 1650    Education provided  Yes    Education Details  exercise technique, stability (with SMOs), tentative plan for next session    Person(s) Educated  Patient    Methods  Explanation;Demonstration;Tactile cues;Verbal cues    Comprehension  Verbalized understanding;Returned demonstration;Verbal cues required;Tactile cues required;Need further instruction       PT Short Term Goals - 01/15/19 1442      PT SHORT TERM GOAL #1   Title  Patient  will report compliance with HEP for continued strengthening and stability during functional mobility.     Baseline  HEP compliant    Time  2    Period  Weeks    Status  Achieved        PT Long Term Goals - 01/15/19 1442      PT LONG TERM GOAL #1   Title  Patient will improve Dynamic Gait Index (DGI) score to > 21/24 for meaningful improvement and low falls risk regarding dynamic walking tasks (revised from > 19/24 )    Baseline  14/24; 11/02/16: 15/24 01/28/17: 18/24 6/5: 19/24; 08/23/17 14/24,  14/24 10/11/17 14/24 12/20/17, 12/27/17/  14/24 9/6 deferred due to precautions; 10/04/18: 17/24; 12/07/18: Deferred as this is a poor goals for patient given inherit limitations and scoring system    Time  12    Period  Weeks    Status  Deferred      PT LONG TERM GOAL #2   Title  Patient will increase Berg Balance score by >51/56 points to be considered a low risk for falls for improved safety. (revised from >6 points improvement)    Baseline  11/02/16: 34/56 01/28/17: 39/56 6/5: 45/56; 08/23/2017 = 45/56, 43/56 10/11/17, 43/56 12/20/17, 12/27/17  43/56 6/20: 38/56 no UE support ; 8/8: 40/56 no UE support 9/6: deferred due to precautions; 9/25: 42/56; 12/08/18: 42/56 3/2: 45/56     Time  12    Period  Weeks    Status  Partially Met    Target Date  04/09/19      PT LONG TERM GOAL #3   Title  Patient will be able to transfer in and out of a large car with  a high seat with CGA  to improve ability to go to school/doctor visits.     Baseline  Patient requires min A for transfer into large SUV unless it has a step rail on the side; 5/15: pt requires assist with a really high car but can get out of an SUV without assist, typically needs more assits getting in and getting in on the L side; 6/20: if ledge able to do with little to no assistance.  9/6 deferred due to precautions 9/25: if ledge is able to do with little or not assistance; 12/07/18: if ledge is able to do with little or no assistance 3/2: if ledge able  to perform     Time  12    Period  Weeks    Status  Partially Met    Target Date  04/09/19      PT LONG TERM  GOAL #4   Title  Patient will complete a TUG test in < 12 seconds for independent mobility and decreased fall risk     Baseline  11.45; 08/23/2017 = 13.66 sec, 13.40 12/20/17, 12/27/17 12.07 sec; 5/15: 13.42 seconds 6/20: 12 seconds, 10/04/18: 13.0s; 12/07/18: 11.6 seconds    Time  12    Period  Weeks    Status  Achieved      PT LONG TERM GOAL #5   Title  Patient will improve 6 minute walk distance by > 150 ft for improved return to functional community activities     Baseline  740 01/28/2017: 744f 6/5: 795  730 on 07/19/17; 790 ft on 08/23/18,  740 feet, 730 feet 12/20/17, 12/27/17 650 feet 6/20: 665 8/8: 701 9/6: deferred due to precautions 9/25: 6462f 10/11/18: [id number]/23/20: 715 feet 3/2: will perform next session with new AFO's    Time  12    Period  Weeks    Status  On-going      PT LONG TERM GOAL #6   Title  Patient will improve gait speed to > 1.2 m/s with least restrictive assistive device to return to normal walking speed     Baseline  .76 m/h 01/28/2017: .87  07/19/17 0.41m74m0.77 m/s on 08/23/17, . 58 m/sec 10/11/17 .49m/48m6/20: 77 m/s  8/8: .75m/s24m: terminated due to precuations for HR 9/25: 0.37m/s,32mf-selected: 16.2s = 0.62 m/s, fastest: 14.2s = 0.70 m/s; self-selected: 20.1s=0.50 m/s fastest: 13.0s=0.77 m/s    Time  12    Period  Weeks    Status  On-going   04/09/19 target date     PT LONG TERM GOAL #7   Title  Patient (< 60 year88old) will complete five times sit to stand test in < 10 seconds indicating an increased LE strength and improved balance     Baseline  9.45 sec; 9.63 on 08/23/17, 9.63 sec 12/27/17, 10.07 sec; 12/08/18: 10.2 seconds 3/2: 9 seconds no UE support    Time  12    Period  Weeks    Status  Achieved   01/18/19 target date     PT LONG TERM GOAL #8   Title  Patient will be able to ambulate on inclines and grass independenlty with LRAD    Baseline   Can ambulate over grassy inclines with decreased speed and safety requiring need loftstrand curtches and SBA; 5/15: pt reports she has to walk slowly and feels unsteady but that this is improving6/20 : reports cautious and slow walk, able to do mod I9/6:  deffered due to precautions9/25: able to do slowly and with caution due to fear of falling, able to do mod I, 10/25/18:  able to do slowly and with caution due to fear of falling, able to do mod I; 12/07/18: still able to perform with lofstrand crutches but remains a challenge with fear of falling 3/2: uphill more challenging than downhill, grass continues to be a challenge     Time  12    Period  Weeks    Status  On-going   04/09/19 target date     PT LONG TERM GOAL  #9   TITLE  Patient will be able to transfer from low chair or stool without UE support independently    Baseline  able to perform independently when not tired, but when fatigued requires support. 10/25/18: Able to perform when legs are not fatigued, mostly limited by balance; 12/07/18: Able to perform from regular height  chair and slightly lower surface, limited by balance and better when legs are not fatigued; 3/2: limited when LE's are <90 degree angle    Time  12    Period  Weeks    Status  Partially Met   04/09/19 target date     PT LONG TERM GOAL  #10   TITLE  Patient will be able to transfer from the floor to standing independently with use of LRAD    Baseline  difficult on different types of floor. Slippery floors more challenging and how long on the floor. Only need assistance when not able to put crutch against surface. 9/25: can do mod I if immediately stands back up, requires assistance if is in position for long, more difficulty with harder surfaces; 10/25/18: able to perform on soft surfaces for loftstrand traction, learned how to shorten loftrands so it can be performed on tile without falling    Time  12    Period  Weeks    Status  Achieved   03/21/18 target date     PT  LONG TERM GOAL  #11   TITLE  Patient will step up onto a 6" step 5x with AD independently to increase community mobility    Baseline  6/20: challenging to patient ascending steps, supervision ; 8/8: difficult clearing L foot /14: can step up curb independently     Time  12    Period  Weeks    Status  Achieved   01/03/18 target date           Plan - 01/24/19 1713    Clinical Impression Statement  The patient continues to present to PT motivated and is eager to improve. Patient was able to tolerate and perform standing exercises (both balance and strengthening) with only SMOs donned this session (no AFOs). Although the patient indicates it is more challenging for her from both a balance and strength standpoint with just her SMOs, she continues to perform exercises with proper form and appreciates a good challenge. Tentative plan for next session will be to perform 6MWT if appropriate and if patient has her new AFOs. The patient will continue to benefit from skilled PT services in order to address deficits in strength, balance, and mobility in order to improve and maintain full function at home and school.     Rehab Potential  Good    Clinical Impairments Affecting Rehab Potential  weakness and decreased standing balance    PT Frequency  1x / week    PT Duration  12 weeks    PT Treatment/Interventions  Therapeutic exercise;Therapeutic activities;Gait training;Balance training;Stair training;DME Instruction;Neuromuscular re-education;Patient/family education;Functional mobility training;Passive range of motion;Electrical Stimulation;Energy conservation;Orthotic Fit/Training;Manual techniques    PT Next Visit Plan  Continue strength/balance, continue working on step-ups to pratice curb negotiation. Remove AFOs for additional LE strengthening; 6MWT if patient has new AFOs    PT Home Exercise Plan  added seated dynamic core UE and LE lift offs with visual cues using mirror, supine marches     Consulted and Agree with Plan of Care  Patient       Patient will benefit from skilled therapeutic intervention in order to improve the following deficits and impairments:  Abnormal gait, Decreased balance, Decreased endurance, Difficulty walking, Decreased strength, Decreased activity tolerance, Decreased coordination, Decreased mobility, Decreased range of motion, Impaired flexibility, Postural dysfunction, Improper body mechanics  Visit Diagnosis: Muscle weakness (generalized)  Difficulty in walking, not elsewhere classified  Other lack of coordination  Personal history  of fall     Problem List There are no active problems to display for this patient.  Orlean Patten, SPT  This entire session was performed under direct supervision and direction of a licensed therapist/therapist assistant . I have personally read, edited and approve of the note as written.  Janna Arch, PT, DPT   01/25/2019, 11:53 AM  [place] MAIN [facility] SERVICES [address] [place], [place], [address] Phone: [phone]   Fax:  [phone]  Name: PRESLEE REGAS MRN: [id number] Date of Birth: Aug 08, 1996

## 2019-01-29 ENCOUNTER — Ambulatory Visit: Payer: Medicaid Other

## 2019-01-29 ENCOUNTER — Other Ambulatory Visit: Payer: Self-pay

## 2019-01-29 DIAGNOSIS — Z9181 History of falling: Secondary | ICD-10-CM

## 2019-01-29 DIAGNOSIS — R278 Other lack of coordination: Secondary | ICD-10-CM

## 2019-01-29 DIAGNOSIS — R262 Difficulty in walking, not elsewhere classified: Secondary | ICD-10-CM

## 2019-01-29 DIAGNOSIS — M6281 Muscle weakness (generalized): Secondary | ICD-10-CM | POA: Diagnosis not present

## 2019-01-29 NOTE — Therapy (Addendum)
[place] MAIN [facility] SERVICES [address] [place], [place], [address] Phone: [phone]   Fax:  [phone]  Physical Therapy Treatment  Patient Details  Name: Michelle Randall MRN: [id number] Date of Birth: [birth year] No data recorded  Encounter Date: 01/29/2019  PT End of Session - 01/29/19 1822    Visit Number  157    Number of Visits  167    Date for PT Re-Evaluation  04/09/19    Authorization Type  last goals 3/2     Authorization Time Period  12 units 2/10-03/18/19     Authorization - Visit Number  5    Authorization - Number of Visits  12    PT Start Time  4503    PT Stop Time  1728    PT Time Calculation (min)  42 min    Equipment Utilized During Treatment  Gait belt    Activity Tolerance  Patient tolerated treatment well    Behavior During Therapy  WFL for tasks assessed/performed       Past Medical History:  Diagnosis Date  . Neurogenic bladder   . Neurogenic bowel   . S/P VP shunt   . Spina bifida [facility])     Past Surgical History:  Procedure Laterality Date  . VENTRICULOPERITONEAL SHUNT      There were no vitals filed for this visit.  Subjective Assessment - 01/29/19 1820    Subjective  Patient reports that she is doing well today. Denies pain today as well as falls/stumbles since last visit. She notes that she does not have her new AFOs yet, as they did not fit her correctly when she went to her appointment. She notes that she hopes they will be in for next session but is not sure what the timeline is.     Patient is accompained by:  Family member    Limitations  Walking;Standing;House hold activities;Other (comment)    How long can you stand comfortably?  require use of bilateral crutches    How long can you walk comfortably?  dependent upon terrain    Patient Stated Goals  Patient wants to be able to negotiate uneven terrain at school, cross thresholds in home environment, and negotiate steps in household     Currently in Pain?  No/denies        Therapeutic Exercise: -Seated balloon taps reaching inside and outside BOS 3 minutes for core and trunk stabilization.    -Hooklying clams with gentle manual resistance x 12  -Supine: Hamstring stretch 1x60 seconds each LE  -Long sitting on mat while engaged in UE/cognitive task x7 minutes for core/trunk strength and stabilization; patient demonstrates proper use of musculature to maintain good upright sitting posture    Neuro Re-Education: -Gait training in rehab gym with loftstrand crutches with AFOs removed but SMOs donned. Worked on balance and step length and assessed for stability; 1x60 feet and 1x40 feet; CGA, no LOB  -Alternating forward taps onto 6" step x10 each LE wearing SMOs (no AFOs); CGA; requires use of BUE in // bars for balance/stability; requires intermittent verbal cues to promote weight shift; no LOB  -Lateral walking in // bars down and back x2 with SMOs donned (no AFOs) with UE support for balance; CGA; no LOB; patient indicates first down and back easy, second more difficult due to fatigue (patient demonstrates decreased step length and increased reliance on UEs for assistance  PT Education - 01/29/19 1821    Education provided  Yes    Education Details  exercise technique, stability and mobility (with SMOs only)    Person(s) Educated  Patient    Methods  Explanation;Demonstration;Tactile cues;Verbal cues    Comprehension  Verbalized understanding;Returned demonstration;Verbal cues required;Tactile cues required;Need further instruction       PT Short Term Goals - 01/15/19 1442      PT SHORT TERM GOAL #1   Title  Patient will report compliance with HEP for continued strengthening and stability during functional mobility.     Baseline  HEP compliant    Time  2    Period  Weeks    Status  Achieved        PT Long Term Goals - 01/15/19 1442      PT LONG TERM GOAL #1   Title   Patient will improve Dynamic Gait Index (DGI) score to > 21/24 for meaningful improvement and low falls risk regarding dynamic walking tasks (revised from > 19/24 )    Baseline  14/24; 11/02/16: 15/24 01/28/17: 18/24 6/5: 19/24; 08/23/17 14/24,  14/24 10/11/17 14/24 12/20/17, 12/27/17/  14/24 9/6 deferred due to precautions; 10/04/18: 17/24; 12/07/18: Deferred as this is a poor goals for patient given inherit limitations and scoring system    Time  12    Period  Weeks    Status  Deferred      PT LONG TERM GOAL #2   Title  Patient will increase Berg Balance score by >51/56 points to be considered a low risk for falls for improved safety. (revised from >6 points improvement)    Baseline  11/02/16: 34/56 01/28/17: 39/56 6/5: 45/56; 08/23/2017 = 45/56, 43/56 10/11/17, 43/56 12/20/17, 12/27/17  43/56 6/20: 38/56 no UE support ; 8/8: 40/56 no UE support 9/6: deferred due to precautions; 9/25: 42/56; 12/08/18: 42/56 3/2: 45/56     Time  12    Period  Weeks    Status  Partially Met    Target Date  04/09/19      PT LONG TERM GOAL #3   Title  Patient will be able to transfer in and out of a large car with  a high seat with CGA  to improve ability to go to school/doctor visits.     Baseline  Patient requires min A for transfer into large SUV unless it has a step rail on the side; 5/15: pt requires assist with a really high car but can get out of an SUV without assist, typically needs more assits getting in and getting in on the L side; 6/20: if ledge able to do with little to no assistance.  9/6 deferred due to precautions 9/25: if ledge is able to do with little or not assistance; 12/07/18: if ledge is able to do with little or no assistance 3/2: if ledge able to perform     Time  12    Period  Weeks    Status  Partially Met    Target Date  04/09/19      PT LONG TERM GOAL #4   Title  Patient will complete a TUG test in < 12 seconds for independent mobility and decreased fall risk     Baseline  11.45; 08/23/2017 =  13.66 sec, 13.40 12/20/17, 12/27/17 12.07 sec; 5/15: 13.42 seconds 6/20: 12 seconds, 10/04/18: 13.0s; 12/07/18: 11.6 seconds    Time  12    Period  Weeks    Status  Achieved  PT LONG TERM GOAL #5   Title  Patient will improve 6 minute walk distance by > 150 ft for improved return to functional community activities     Baseline  740 01/28/2017: 74f 6/5: 795  730 on 07/19/17; 790 ft on 08/23/18,  740 feet, 730 feet 12/20/17, 12/27/17 650 feet 6/20: 665 8/8: 701 9/6: deferred due to precautions 9/25: 6459f 10/11/18: [id number]/23/20: 715 feet 3/2: will perform next session with new AFO's    Time  12    Period  Weeks    Status  On-going      PT LONG TERM GOAL #6   Title  Patient will improve gait speed to > 1.2 m/s with least restrictive assistive device to return to normal walking speed     Baseline  .76 m/h 01/28/2017: .87  07/19/17 0.58m88m0.77 m/s on 08/23/17, . 58 m/sec 10/11/17 .34m/20m6/20: 77 m/s  8/8: .87m/s37m: terminated due to precuations for HR 9/25: 0.22m/s,35mf-selected: 16.2s = 0.62 m/s, fastest: 14.2s = 0.70 m/s; self-selected: 20.1s=0.50 m/s fastest: 13.0s=0.77 m/s    Time  12    Period  Weeks    Status  On-going   04/09/19 target date     PT LONG TERM GOAL #7   Title  Patient (< 60 year74old) will complete five times sit to stand test in < 10 seconds indicating an increased LE strength and improved balance     Baseline  9.45 sec; 9.63 on 08/23/17, 9.63 sec 12/27/17, 10.07 sec; 12/08/18: 10.2 seconds 3/2: 9 seconds no UE support    Time  12    Period  Weeks    Status  Achieved   01/18/19 target date     PT LONG TERM GOAL #8   Title  Patient will be able to ambulate on inclines and grass independenlty with LRAD    Baseline  Can ambulate over grassy inclines with decreased speed and safety requiring need loftstrand curtches and SBA; 5/15: pt reports she has to walk slowly and feels unsteady but that this is improving6/20 : reports cautious and slow walk, able to do mod I9/6:  deffered  due to precautions9/25: able to do slowly and with caution due to fear of falling, able to do mod I, 10/25/18:  able to do slowly and with caution due to fear of falling, able to do mod I; 12/07/18: still able to perform with lofstrand crutches but remains a challenge with fear of falling 3/2: uphill more challenging than downhill, grass continues to be a challenge     Time  12    Period  Weeks    Status  On-going   04/09/19 target date     PT LONG TERM GOAL  #9   TITLE  Patient will be able to transfer from low chair or stool without UE support independently    Baseline  able to perform independently when not tired, but when fatigued requires support. 10/25/18: Able to perform when legs are not fatigued, mostly limited by balance; 12/07/18: Able to perform from regular height chair and slightly lower surface, limited by balance and better when legs are not fatigued; 3/2: limited when LE's are <90 degree angle    Time  12    Period  Weeks    Status  Partially Met   04/09/19 target date     PT LONG TERM GOAL  #10   TITLE  Patient will be able to transfer from the floor to standing independently  with use of LRAD    Baseline  difficult on different types of floor. Slippery floors more challenging and how long on the floor. Only need assistance when not able to put crutch against surface. 9/25: can do mod I if immediately stands back up, requires assistance if is in position for long, more difficulty with harder surfaces; 10/25/18: able to perform on soft surfaces for loftstrand traction, learned how to shorten loftrands so it can be performed on tile without falling    Time  12    Period  Weeks    Status  Achieved   03/21/18 target date     PT LONG TERM GOAL  #11   TITLE  Patient will step up onto a 6" step 5x with AD independently to increase community mobility    Baseline  6/20: challenging to patient ascending steps, supervision ; 8/8: difficult clearing L foot /14: can step up curb independently      Time  12    Period  Weeks    Status  Achieved   01/03/18 target date           Plan - 01/29/19 1836    Clinical Impression Statement  The patient continues to present to PT motivated. Patient was again able to tolerate and perform standing exercises (both balance and strengthening) with only SMOs donned this session (no AFOs). Patient demonstrated good ankle stability and balance during performance of lateral walking this session with just SMOs donned, but demonstrated BLE fatigue during second lap down and back in parallel bars which required increased reliance on her upper extremities to assist. Tentative plan for next session will be to perform 6MWT if appropriate and if patient has her new AFOs. The patient will continue to benefit from skilled PT services in order to address deficits in strength, balance, and mobility in order to improve and maintain full function at home and school.     Rehab Potential  Good    Clinical Impairments Affecting Rehab Potential  weakness and decreased standing balance    PT Frequency  1x / week    PT Duration  12 weeks    PT Treatment/Interventions  Therapeutic exercise;Therapeutic activities;Gait training;Balance training;Stair training;DME Instruction;Neuromuscular re-education;Patient/family education;Functional mobility training;Passive range of motion;Electrical Stimulation;Energy conservation;Orthotic Fit/Training;Manual techniques    PT Next Visit Plan  Continue strength/balance, continue working on step-ups to pratice curb negotiation. Remove AFOs for additional LE strengthening; 6MWT if patient has new AFOs    PT Home Exercise Plan  added seated dynamic core UE and LE lift offs with visual cues using mirror, supine marches    Consulted and Agree with Plan of Care  Patient       Patient will benefit from skilled therapeutic intervention in order to improve the following deficits and impairments:  Abnormal gait, Decreased balance, Decreased  endurance, Difficulty walking, Decreased strength, Decreased activity tolerance, Decreased coordination, Decreased mobility, Decreased range of motion, Impaired flexibility, Postural dysfunction, Improper body mechanics  Visit Diagnosis: Muscle weakness (generalized)  Difficulty in walking, not elsewhere classified  Other lack of coordination  Personal history of fall     Problem List There are no active problems to display for this patient.  Orlean Patten, SPT  This entire session was performed under direct supervision and direction of a licensed therapist/therapist assistant . I have personally read, edited and approve of the note as written.  Janna Arch, PT, DPT   01/30/2019, 9:36 AM  [facility] MAIN REHAB  [place], [place], [address] Phone: [phone]   Fax:  [phone]  Name: Michelle Randall MRN: [id number] Date of Birth: 31-Mar-1996

## 2019-02-05 ENCOUNTER — Ambulatory Visit: Payer: Medicaid Other

## 2019-02-06 NOTE — Therapy (Signed)
[place] [facility] MAIN [facility] SERVICES [address] [place], [place], [address] Phone: [phone]   Fax:  [phone]  Patient Details  Name: Michelle Randall MRN: [id number] Date of Birth: April 23, 1996 Referring Provider:  No ref. provider found  Encounter Date: 02/06/2019  Patient called by PT to ensure patient is doing well, does not have questions, and review HEP. Called due to current outpatient closure for COVID- 19. Patient did not pick up PT call. PT left voicemail stating reason for call and number for call back for further questions/concerns.   Precious Bard, PT, DPT   02/06/2019, 9:56 AM  [place] [facility] MAIN [facility] SERVICES [address] [place], [place], [address] Phone: [phone]   Fax:  [phone]

## 2019-02-12 ENCOUNTER — Ambulatory Visit: Payer: Medicaid Other

## 2019-02-19 ENCOUNTER — Ambulatory Visit: Payer: Medicaid Other

## 2019-02-26 ENCOUNTER — Ambulatory Visit: Payer: Medicaid Other

## 2019-02-26 NOTE — Therapy (Signed)
[place] [facility] MAIN [facility] SERVICES [address] [place], [place], [address] Phone: [phone]   Fax:  [phone]  Patient Details  Name: Michelle Randall MRN: [id number] Date of Birth: June 30, 1996 Referring Provider:  No ref. provider found  Encounter Date: 02/26/2019   The patient has been contacted today in regards to telehealth services. The patient expressed an interest in participating in telehealth visits. Pt has access to high speed internet and a connected device with video. Patient has been informed that an [facility] support representative will be reaching out to them to verify their insurance benefits and for scheduling     Lynnea Maizes PT, DPT, GCS  Huprich,Jason 02/26/2019, 2:51 PM   [facility] MAIN [facility] SERVICES [address] [place], [place], [address] Phone: [phone]   Fax:  [phone]

## 2019-03-05 ENCOUNTER — Ambulatory Visit: Payer: Medicaid Other

## 2019-03-06 ENCOUNTER — Ambulatory Visit: Payer: Medicaid Other | Attending: Student

## 2019-03-06 ENCOUNTER — Other Ambulatory Visit: Payer: Self-pay

## 2019-03-06 DIAGNOSIS — R262 Difficulty in walking, not elsewhere classified: Secondary | ICD-10-CM | POA: Diagnosis present

## 2019-03-06 DIAGNOSIS — M6281 Muscle weakness (generalized): Secondary | ICD-10-CM | POA: Insufficient documentation

## 2019-03-06 NOTE — Therapy (Addendum)
[place] MAIN [facility] SERVICES [address] [place], [place], [address] Phone: [phone]   Fax:  [phone]  Physical Therapy Treatment/Goal Update  Patient Details  Name: Michelle Randall MRN: [id number] Date of Birth: [birth year] No data recorded  Encounter Date: 03/06/2019  PT End of Session - 03/06/19 0905    Visit Number  [id number]    Number of Visits  167    Date for PT Re-Evaluation  04/09/19    Authorization Type  last goals 3/2, telemedicine update 03/06/19    Authorization Time Period  12 units 2/10-03/18/19     Authorization - Visit Number  6    Authorization - Number of Visits  12    PT Start Time  0900    PT Stop Time  1000    PT Time Calculation (min)  60 min    Equipment Utilized During Treatment  Gait belt    Activity Tolerance  Patient tolerated treatment well    Behavior During Therapy  WFL for tasks assessed/performed       Past Medical History:  Diagnosis Date  . Neurogenic bladder   . Neurogenic bowel   . S/P VP shunt   . Spina bifida [facility])     Past Surgical History:  Procedure Laterality Date  . VENTRICULOPERITONEAL SHUNT      There were no vitals filed for this visit.  Subjective Assessment - 03/06/19 0904    Subjective  Patient reports that she is doing well today. Denies pain today as well as falls/stumbles since last visit. She notes that she has her new AFOs and they added an anterior strap which has helped keeping her ankle from slipping out. She has remained active during the quarantine time by walking outside.    Patient is accompained by:  Family member    Limitations  Walking;Standing;House hold activities;Other (comment)    How long can you stand comfortably?  require use of bilateral crutches    How long can you walk comfortably?  dependent upon terrain    Patient Stated Goals  Patient wants to be able to negotiate uneven terrain at school, cross thresholds in home environment, and negotiate steps in  household     Currently in Pain?  No/denies          Physical Therapy Telehealth Visit:  I connected with Signora Duke today at 9:00 by Webex video conference and verified that I am speaking with the correct person using two identifiers. I discussed the limitations, risks, security and privacy concerns of performing an evaluation and management service by Webex and the availability of in person appointments. I also discussed with the patient that there may be a patient responsible charge related to this service. The patient expressed understanding and agreed to proceed. Identified to the patient that therapist is a licensed physical therapist in the state of [place].  Other persons participating in the visit and their role in the encounter: Sister present and assists with guarding and positioning as needed Patient's location: Home Patient's address: [place] [address] (confirmed in case of emergency) Patient's phone #: [phone] (confirmed in case of technical difficulties) Provider's location: [facility] OP clinic at main hospital in [place] Patient agreed to evaluation/treatment by telemedicine    TREATMENT   Ther-ex  [place] Hooklying adductor pillow squeeze 3-5s hold, 1 minute x 2; Hooklying lateral knee rocking 1 minute x 2; Hook[place] posterior pelvic tilt 3-5s hold 1 minute x 2; Hooklying  bridges with feet blocked against the desk 1 minute x 2, coordinated exhale for deep abdominal contraction; Hooklying marches knee to chest alternating 1 minute x 2, coordinated exhale for deep abdominal contraction; Abdominal crunch roll up 3s hold x 1 minute; Quadruped alternating UE extension 5s hold x 1 minute; Quadruped alternating LE extension 5s hold x 1 minute; Quadruped  and LE extension x 10 with 5 sec hold; Sit to stand without AD x 10 from couch, sister guarding; Discussed use of LE ergometer and attempted to practice however pt is unable to utilize  appropriately due to LE weakness and tone. Discussed use for UE on table to achieve appropriate increase in HR and challenge to cardiopulmonary system;   Patient needs cues for correct technique and form occasionally especially cues for breathing.  Pt educated throughout session about proper posture and technique with exercises. Improved exercise technique, movement at target joints, use of target muscles after min to mod verbal cues.     Updated goals with pt today however unable to perform outcome measures due to telemedicine visit. Pt is doing well and has continued to remain active in the absence of formal therapy sessions. She is able to complete all therapy exercises today during session with intermittent assistance from her sister. Discussed use of LE ergometer and attempted to practice however pt is unable to utilize appropriately due to LE weakness and increased LE tone. Discussed use for UE on table to achieve appropriate increase in HR and challenge to cardiopulmonary system. Pt provided additional HEP that she can perform sitting at desk while studying. Pt encouraged to continue her walking program as well as additional HEP and will follow-up next week for further therapy.Pt will benefit from PT services to address deficits in strength, balance, and mobility in order toimprove and maintainfull function at home and school.                       PT Short Term Goals - 03/06/19 1043      PT SHORT TERM GOAL #1   Title  Patient will report compliance with HEP for continued strengthening and stability during functional mobility.     Baseline  HEP compliant    Time  2    Period  Weeks    Status  Achieved        PT Long Term Goals - 03/06/19 1043      PT LONG TERM GOAL #1   Title  Patient will improve Dynamic Gait Index (DGI) score to > 21/24 for meaningful improvement and low falls risk regarding dynamic walking tasks (revised from > 19/24 )    Baseline  14/24;  11/02/16: 15/24 01/28/17: 18/24 6/5: 19/24; 08/23/17 14/24,  14/24 10/11/17 14/24 12/20/17, 12/27/17/  14/24 9/6 deferred due to precautions; 10/04/18: 17/24; 12/07/18: Deferred as this is a poor goals for patient given inherit limitations and scoring system    Time  12    Period  Weeks    Status  Deferred      PT LONG TERM GOAL #2   Title  Patient will increase Berg Balance score by >51/56 points to be considered a low risk for falls for improved safety. (revised from >6 points improvement)    Baseline  11/02/16: 34/56 01/28/17: 39/56 6/5: 45/56; 08/23/2017 = 45/56, 43/56 10/11/17, 43/56 12/20/17, 12/27/17  43/56 6/20: 38/56 no UE support ; 8/8: 40/56 no UE support 9/6: deferred due to precautions; 9/25: 42/56; 12/08/18: 42/56 3/2: 45/56;  03/06/19: Unable to perform over telemedicine    Time  12    Period  Weeks    Status  Deferred      PT LONG TERM GOAL #3   Title  Patient will be able to transfer in and out of a large car with  a high seat with CGA  to improve ability to go to school/doctor visits.     Baseline  Patient requires min A for transfer into large SUV unless it has a step rail on the side; 5/15: pt requires assist with a really high car but can get out of an SUV without assist, typically needs more assits getting in and getting in on the L side; 6/20: if ledge able to do with little to no assistance.  9/6 deferred due to precautions 9/25: if ledge is able to do with little or not assistance; 12/07/18: if ledge is able to do with little or no assistance 3/2: if ledge able to perform; 03/06/19: Unchanged at this time    Time  12    Period  Weeks    Status  Partially Met      PT LONG TERM GOAL #4   Title  Patient will complete a TUG test in < 12 seconds for independent mobility and decreased fall risk     Baseline  11.45; 08/23/2017 = 13.66 sec, 13.40 12/20/17, 12/27/17 12.07 sec; 5/15: 13.42 seconds 6/20: 12 seconds, 10/04/18: 13.0s; 12/07/18: 11.6 seconds;    Time  12    Period  Weeks    Status   Achieved      PT LONG TERM GOAL #5   Title  Patient will improve 6 minute walk distance by > 150 ft for improved return to functional community activities     Baseline  740 01/28/2017: 732f 6/5: 795  730 on 07/19/17; 790 ft on 08/23/18,  740 feet, 730 feet 12/20/17, 12/27/17 650 feet 6/20: 665 8/8: 701 9/6: deferred due to precautions 9/25: 6464f 10/11/18: 64041f1/23/20: 715 feet 3/2: will perform next session with new AFO's; 03/06/19:  03/06/19: Unable to perform over telemedicine    Time  12    Period  Weeks    Status  Deferred      PT LONG TERM GOAL #6   Title  Patient will improve gait speed to > 1.2 m/s with least restrictive assistive device to return to normal walking speed     Baseline  .76 m/h 01/28/2017: .87  07/19/17 0.13m54m0.77 m/s on 08/23/17, . 58 m/sec 10/11/17 .62m/31m6/20: 77 m/s  8/8: .71m/s62m: terminated due to precuations for HR 9/25: 0.33m/s,80mf-selected: 16.2s = 0.62 m/s, fastest: 14.2s = 0.70 m/s; self-selected: 20.1s=0.50 m/s fastest: 13.0s=0.77 m/s; 03/06/19:  03/06/19: Unable to perform over telemedicine    Time  12    Period  Weeks    Status  Deferred   04/09/19 target date     PT LONG TERM GOAL #7   Title  Patient (< 60 year89old) will complete five times sit to stand test in < 10 seconds indicating an increased LE strength and improved balance     Baseline  9.45 sec; 9.63 on 08/23/17, 9.63 sec 12/27/17, 10.07 sec; 12/08/18: 10.2 seconds 3/2: 9 seconds no UE support    Time  12    Period  Weeks    Status  Achieved   01/18/19 target date     PT LONG TERM GOAL #8   Title  Patient will be  able to ambulate on inclines and grass independenlty with LRAD    Baseline  Can ambulate over grassy inclines with decreased speed and safety requiring need loftstrand curtches and SBA; 5/15: pt reports she has to walk slowly and feels unsteady but that this is improving6/20 : reports cautious and slow walk, able to do mod I9/6:  deffered due to precautions9/25: able to do slowly and with  caution due to fear of falling, able to do mod I, 10/25/18:  able to do slowly and with caution due to fear of falling, able to do mod I; 12/07/18: still able to perform with lofstrand crutches but remains a challenge with fear of falling 3/2: uphill more challenging than downhill, grass continues to be a challenge; 03/06/19: Unchanged    Time  12    Period  Weeks    Status  On-going   04/09/19 target date     PT LONG TERM GOAL  #9   TITLE  Patient will be able to transfer from low chair or stool without UE support independently    Baseline  able to perform independently when not tired, but when fatigued requires support. 10/25/18: Able to perform when legs are not fatigued, mostly limited by balance; 12/07/18: Able to perform from regular height chair and slightly lower surface, limited by balance and better when legs are not fatigued; 3/2: limited when LE's are <90 degree angle; 03/06/19: unchanged    Time  12    Period  Weeks    Status  Partially Met   04/09/19 target date     PT LONG TERM GOAL  #10   TITLE  Patient will be able to transfer from the floor to standing independently with use of LRAD    Baseline  difficult on different types of floor. Slippery floors more challenging and how long on the floor. Only need assistance when not able to put crutch against surface. 9/25: can do mod I if immediately stands back up, requires assistance if is in position for long, more difficulty with harder surfaces; 10/25/18: able to perform on soft surfaces for loftstrand traction, learned how to shorten loftrands so it can be performed on tile without falling    Time  12    Period  Weeks    Status  Achieved   03/21/18 target date     PT LONG TERM GOAL  #11   TITLE  Patient will step up onto a 6" step 5x with AD independently to increase community mobility    Baseline  6/20: challenging to patient ascending steps, supervision ; 8/8: difficult clearing L foot /14: can step up curb independently     Time  12     Period  Weeks    Status  Achieved   01/03/18 target date           Plan - 03/06/19 1042    Clinical Impression Statement  Updated goals with pt today however unable to perform outcome measures due to telemedicine visit. Pt is doing well and has continued to remain active in the absence of formal therapy sessions. She is able to complete all therapy exercises today during session with intermittent assistance from her sister. Discussed use of LE ergometer and attempted to practice however pt is unable to utilize appropriately due to LE weakness and increased LE tone. Discussed use for UE on table to achieve appropriate increase in HR and challenge to cardiopulmonary system. Pt provided additional HEP that she can perform sitting at  desk while studying. Pt encouraged to continue her walking program as well as additional HEP and will follow-up next week for further therapy.Pt will benefit from PT services to address deficits in strength, balance, and mobility in order toimprove and maintainfull function at home and school.    Rehab Potential  Good    Clinical Impairments Affecting Rehab Potential  weakness and decreased standing balance    PT Frequency  1x / week    PT Duration  12 weeks    PT Treatment/Interventions  Therapeutic exercise;Therapeutic activities;Gait training;Balance training;Stair training;DME Instruction;Neuromuscular re-education;Patient/family education;Functional mobility training;Passive range of motion;Electrical Stimulation;Energy conservation;Orthotic Fit/Training;Manual techniques    PT Next Visit Plan  Continue strength/balance, continue working on step-ups to pratice curb negotiation when back in clinic. Remove AFOs for additional LE strengthening; 6MWT with new AFOs once pt is back in clinic    PT Home Exercise Plan  added seated dynamic core UE and LE lift offs with visual cues using mirror, supine marches, Seated program: [email or web address]     Consulted and Agree with  Plan of Care  Patient       Patient will benefit from skilled therapeutic intervention in order to improve the following deficits and impairments:  Abnormal gait, Decreased balance, Decreased endurance, Difficulty walking, Decreased strength, Decreased activity tolerance, Decreased coordination, Decreased mobility, Decreased range of motion, Impaired flexibility, Postural dysfunction, Improper body mechanics  Visit Diagnosis: Muscle weakness (generalized)  Difficulty in walking, not elsewhere classified     Problem List There are no active problems to display for this patient.  Phillips Grout PT, DPT, GCS  Annmarie Plemmons 03/06/2019, 11:53 AM  [place] MAIN [facility] SERVICES [address] [place], [place], [address] Phone: [phone]   Fax:  [phone]  Name: Michelle Randall MRN: [id number] Date of Birth: January 08, 1996

## 2019-03-06 NOTE — Patient Instructions (Signed)
Access Code: [email or web address]  URL: [email or web address]  Date: 03/06/2019  Prepared by: Ria Comment   Exercises  Seated March - 10 reps - 2 sets - 3 seconds hold - 1x daily - 7x weekly  Seated Hip Adduction Isometrics with Ball - 10 reps - 2 sets - 3 seconds hold - 1x daily - 7x weekly  Seated Isometric Hip Abduction - 10 reps - 2 sets - 3 seconds hold - 1x daily - 7x weekly  Seated Long Arc Quad - 10 reps - 2 sets - 1x daily - 7x weekly  Seated Shoulder Circles - 10 reps - 2 sets - 1x daily - 7x weekly  Seated Scapular Retraction - 10 reps - 2 sets - 3 seconds hold - 1x daily - 7x weekly  Seated Hamstring Stretch - 3 reps - 30 seconds hold - 2x daily - 7x weekly  Seated Piriformis Stretch - 3 reps - 30 seconds hold - 2x daily - 7x weekly  Seated Thoracic Lumbar Extension with Pectoralis Stretch - 3 reps - 20-30 seconds hold - 2x daily - 7x weekly

## 2019-03-12 ENCOUNTER — Ambulatory Visit: Payer: Medicaid Other

## 2019-03-14 ENCOUNTER — Ambulatory Visit: Payer: Medicaid Other

## 2019-03-14 ENCOUNTER — Other Ambulatory Visit: Payer: Self-pay

## 2019-03-14 DIAGNOSIS — M6281 Muscle weakness (generalized): Secondary | ICD-10-CM

## 2019-03-14 DIAGNOSIS — R262 Difficulty in walking, not elsewhere classified: Secondary | ICD-10-CM

## 2019-03-14 NOTE — Therapy (Signed)
[place] MAIN [facility] SERVICES [address] [place], [place], [address] Phone: [phone]   Fax:  [phone]  Physical Therapy Treatment  Patient Details  Name: Michelle Randall MRN: [id number] Date of Birth: 1996/05/15 No data recorded  Encounter Date: 03/14/2019  PT End of Session - 03/14/19 1526    Visit Number  [id number]    Number of Visits  167    Date for PT Re-Evaluation  04/09/19    Authorization Type  last goals 3/2, telemedicine update 03/06/19    Authorization Time Period  12 visits 2/10-03/18/19     Authorization - Visit Number  7    Authorization - Number of Visits  12    PT Start Time  8811    PT Stop Time  1545    PT Time Calculation (min)  47 min    Equipment Utilized During Treatment  Gait belt    Activity Tolerance  Patient tolerated treatment well    Behavior During Therapy  WFL for tasks assessed/performed       Past Medical History:  Diagnosis Date  . Neurogenic bladder   . Neurogenic bowel   . S/P VP shunt   . Spina bifida [facility])     Past Surgical History:  Procedure Laterality Date  . VENTRICULOPERITONEAL SHUNT      There were no vitals filed for this visit.  Subjective Assessment - 03/14/19 1508    Subjective  Patient reports that she is doing well today. Denies pain today as well as falls/stumbles since last visit. Reports that the new AFOs are working well. No specific questions or concerns at this time.    Patient is accompained by:  Family member    Limitations  Walking;Standing;House hold activities;Other (comment)    How long can you stand comfortably?  require use of bilateral crutches    How long can you walk comfortably?  dependent upon terrain    Patient Stated Goals  Patient wants to be able to negotiate uneven terrain at school, cross thresholds in home environment, and negotiate steps in household     Currently in Pain?  No/denies         TREATMENT   Physical Therapy Telehealth  Visit:  I connected with Joyce Hillmer today at 15:00 by Webex video conference and verified that I am speaking with the correct person using two identifiers. I discussed the limitations, risks, security and privacy concerns of performing an evaluation and management service by Webex. I also discussed with the patient that there may be a patient responsible charge related to this service. The patient expressed understanding and agreed to proceed.Identified to the patient that therapist is a licensed physical therapist in the state of [place].  Other persons participating in the visit and their role in the encounter:Sister present and assists with guarding and positioning as needed Patient's location:Home Patient's address: [place] [address] (confirmed in case of emergency) Patient's phone #: [phone] (confirmed in case of technical difficulties) Provider's location:[facility] OP clinic at main hospital in [place] Patient agreed to evaluation/treatment by telemedicine   Ther-ex  [place]; Hooklying alternating marching with coordinated exhalation x 1 minute; Hooklying alternating hip fallouts with coordinated exhalation x 1 minute; Hooklying adductor pillow squeeze 3-5s hold 1 minute x 2; Hooklying alternating marching with opposite arm tap for oblique activation 1 minute x 2; Hooklying lateral knee rocking with lumbar rotation 1 minute x 2; Hooklying posterior pelvic tilt 3-5s hold 1  minute x 2; Hooklying bridges with feet blocked against the desk 1 minute x 2, coordinated exhale for deep abdominal contraction; Quadruped alternating UE extension 5s hold x 1 minute; Quadruped alternating LE extension 5s hold x 1 minute; Quadruped  and LE extension 5s hold x 1 minute; Front knee planks 30s hold/30s hold x 3; Right and left rolling on floor with UE reaching for oblique activation 1 minute x 2; Sit to stand without AD x 10 from couch holding 2-3# textbook,  sister guarding;   Patient needs cues for correct technique and form occasionally especially cues for breathing.  Pt educated throughout session about proper posture and technique with exercises. Improved exercise technique, movement at target joints, use of target muscles after min to mod verbal cues.    Updated goals with pt during last visit and she demonstrates good maintenance during clinic closure.  She has been able to remain active in the absence of formal therapy sessions. She is able to complete all therapy exercises today during session with intermittent assistance from her sister. She demonstrates excellent motivation. Worked on abdominal and low back strengthening in straight plane and diagonal patterns. Cues provided for coordinated breathing for contraction of deep abdominal musculature. Pt encouraged to continue her walking program as well as additional HEP and will follow-up next week for further therapy.Pt will benefit from PT services to address deficits in strength, balance, and mobility in order toimprove and maintainfull function at home and school.                       PT Short Term Goals - 03/06/19 1043      PT SHORT TERM GOAL #1   Title  Patient will report compliance with HEP for continued strengthening and stability during functional mobility.     Baseline  HEP compliant    Time  2    Period  Weeks    Status  Achieved        PT Long Term Goals - 03/06/19 1043      PT LONG TERM GOAL #1   Title  Patient will improve Dynamic Gait Index (DGI) score to > 21/24 for meaningful improvement and low falls risk regarding dynamic walking tasks (revised from > 19/24 )    Baseline  14/24; 11/02/16: 15/24 01/28/17: 18/24 6/5: 19/24; 08/23/17 14/24,  14/24 10/11/17 14/24 12/20/17, 12/27/17/  14/24 9/6 deferred due to precautions; 10/04/18: 17/24; 12/07/18: Deferred as this is a poor goals for patient given inherit limitations and scoring system    Time  12     Period  Weeks    Status  Deferred      PT LONG TERM GOAL #2   Title  Patient will increase Berg Balance score by >51/56 points to be considered a low risk for falls for improved safety. (revised from >6 points improvement)    Baseline  11/02/16: 34/56 01/28/17: 39/56 6/5: 45/56; 08/23/2017 = 45/56, 43/56 10/11/17, 43/56 12/20/17, 12/27/17  43/56 6/20: 38/56 no UE support ; 8/8: 40/56 no UE support 9/6: deferred due to precautions; 9/25: 42/56; 12/08/18: 42/56 3/2: 45/56; 03/06/19: Unable to perform over telemedicine    Time  12    Period  Weeks    Status  Deferred      PT LONG TERM GOAL #3   Title  Patient will be able to transfer in and out of a large car with  a high seat with CGA  to improve ability to  go to school/doctor visits.     Baseline  Patient requires min A for transfer into large SUV unless it has a step rail on the side; 5/15: pt requires assist with a really high car but can get out of an SUV without assist, typically needs more assits getting in and getting in on the L side; 6/20: if ledge able to do with little to no assistance.  9/6 deferred due to precautions 9/25: if ledge is able to do with little or not assistance; 12/07/18: if ledge is able to do with little or no assistance 3/2: if ledge able to perform; 03/06/19: Unchanged at this time    Time  12    Period  Weeks    Status  Partially Met      PT LONG TERM GOAL #4   Title  Patient will complete a TUG test in < 12 seconds for independent mobility and decreased fall risk     Baseline  11.45; 08/23/2017 = 13.66 sec, 13.40 12/20/17, 12/27/17 12.07 sec; 5/15: 13.42 seconds 6/20: 12 seconds, 10/04/18: 13.0s; 12/07/18: 11.6 seconds;    Time  12    Period  Weeks    Status  Achieved      PT LONG TERM GOAL #5   Title  Patient will improve 6 minute walk distance by > 150 ft for improved return to functional community activities     Baseline  740 01/28/2017: 751f 6/5: 795  730 on 07/19/17; 790 ft on 08/23/18,  740 feet, 730 feet 12/20/17,  12/27/17 650 feet 6/20: 665 8/8: 701 9/6: deferred due to precautions 9/25: 6458f 10/11/18: 64041f1/23/20: 715 feet 3/2: will perform next session with new AFO's; 03/06/19:  03/06/19: Unable to perform over telemedicine    Time  12    Period  Weeks    Status  Deferred      PT LONG TERM GOAL #6   Title  Patient will improve gait speed to > 1.2 m/s with least restrictive assistive device to return to normal walking speed     Baseline  .76 m/h 01/28/2017: .87  07/19/17 0.25m33m0.77 m/s on 08/23/17, . 58 m/sec 10/11/17 .12m/67m6/20: 77 m/s  8/8: .12m/s24m: terminated due to precuations for HR 9/25: 0.28m/s,57mf-selected: 16.2s = 0.62 m/s, fastest: 14.2s = 0.70 m/s; self-selected: 20.1s=0.50 m/s fastest: 13.0s=0.77 m/s; 03/06/19:  03/06/19: Unable to perform over telemedicine    Time  12    Period  Weeks    Status  Deferred   04/09/19 target date     PT LONG TERM GOAL #7   Title  Patient (< 60 year53old) will complete five times sit to stand test in < 10 seconds indicating an increased LE strength and improved balance     Baseline  9.45 sec; 9.63 on 08/23/17, 9.63 sec 12/27/17, 10.07 sec; 12/08/18: 10.2 seconds 3/2: 9 seconds no UE support    Time  12    Period  Weeks    Status  Achieved   01/18/19 target date     PT LONG TERM GOAL #8   Title  Patient will be able to ambulate on inclines and grass independenlty with LRAD    Baseline  Can ambulate over grassy inclines with decreased speed and safety requiring need loftstrand curtches and SBA; 5/15: pt reports she has to walk slowly and feels unsteady but that this is improving6/20 : reports cautious and slow walk, able to do mod I9/6:  deffered due to precautions9/25:  able to do slowly and with caution due to fear of falling, able to do mod I, 10/25/18:  able to do slowly and with caution due to fear of falling, able to do mod I; 12/07/18: still able to perform with lofstrand crutches but remains a challenge with fear of falling 3/2: uphill more challenging  than downhill, grass continues to be a challenge; 03/06/19: Unchanged    Time  12    Period  Weeks    Status  On-going   04/09/19 target date     PT LONG TERM GOAL  #9   TITLE  Patient will be able to transfer from low chair or stool without UE support independently    Baseline  able to perform independently when not tired, but when fatigued requires support. 10/25/18: Able to perform when legs are not fatigued, mostly limited by balance; 12/07/18: Able to perform from regular height chair and slightly lower surface, limited by balance and better when legs are not fatigued; 3/2: limited when LE's are <90 degree angle; 03/06/19: unchanged    Time  12    Period  Weeks    Status  Partially Met   04/09/19 target date     PT LONG TERM GOAL  #10   TITLE  Patient will be able to transfer from the floor to standing independently with use of LRAD    Baseline  difficult on different types of floor. Slippery floors more challenging and how long on the floor. Only need assistance when not able to put crutch against surface. 9/25: can do mod I if immediately stands back up, requires assistance if is in position for long, more difficulty with harder surfaces; 10/25/18: able to perform on soft surfaces for loftstrand traction, learned how to shorten loftrands so it can be performed on tile without falling    Time  12    Period  Weeks    Status  Achieved   03/21/18 target date     PT LONG TERM GOAL  #11   TITLE  Patient will step up onto a 6" step 5x with AD independently to increase community mobility    Baseline  6/20: challenging to patient ascending steps, supervision ; 8/8: difficult clearing L foot /14: can step up curb independently     Time  12    Period  Weeks    Status  Achieved   01/03/18 target date           Plan - 03/14/19 1526    Clinical Impression Statement  Updated goals with pt during last visit and she demonstrates good maintenance during clinic closure.  She has been able to remain  active in the absence of formal therapy sessions. She is able to complete all therapy exercises today during session with intermittent assistance from her sister. She demonstrates excellent motivation. Worked on abdominal and low back strengthening in straight plane and diagonal patterns. Cues provided for coordinated breathing for contraction of deep abdominal musculature. Pt encouraged to continue her walking program as well as additional HEP and will follow-up next week for further therapy.Pt will benefit from PT services to address deficits in strength, balance, and mobility in order toimprove and maintainfull function at home and school.    Rehab Potential  Good    Clinical Impairments Affecting Rehab Potential  weakness and decreased standing balance    PT Frequency  1x / week    PT Duration  12 weeks    PT Treatment/Interventions  Therapeutic  exercise;Therapeutic activities;Gait training;Balance training;Stair training;DME Instruction;Neuromuscular re-education;Patient/family education;Functional mobility training;Passive range of motion;Electrical Stimulation;Energy conservation;Orthotic Fit/Training;Manual techniques    PT Next Visit Plan  Continue strength/balance, continue working on step-ups to pratice curb negotiation when back in clinic. Remove AFOs for additional LE strengthening; 6MWT with new AFOs once pt is back in clinic    PT Home Exercise Plan  added seated dynamic core UE and LE lift offs with visual cues using mirror, supine marches, Seated program: [id number]     Consulted and Agree with Plan of Care  Patient       Patient will benefit from skilled therapeutic intervention in order to improve the following deficits and impairments:  Abnormal gait, Decreased balance, Decreased endurance, Difficulty walking, Decreased strength, Decreased activity tolerance, Decreased coordination, Decreased mobility, Decreased range of motion, Impaired flexibility, Postural dysfunction, Improper  body mechanics  Visit Diagnosis: Muscle weakness (generalized)  Difficulty in walking, not elsewhere classified     Problem List There are no active problems to display for this patient.  Phillips Grout PT, DPT, GCS  [place] 03/14/2019, 4:31 PM  [place] MAIN [facility] SERVICES [address] [place], [place], [address] Phone: [phone]   Fax:  [phone]  Name: Michelle Randall MRN: [id number] Date of Birth: 1996/11/06

## 2019-03-19 ENCOUNTER — Ambulatory Visit: Payer: Medicaid Other

## 2019-04-05 ENCOUNTER — Other Ambulatory Visit: Payer: Self-pay

## 2019-04-05 ENCOUNTER — Ambulatory Visit: Payer: Medicaid Other | Attending: Student

## 2019-04-05 DIAGNOSIS — M6281 Muscle weakness (generalized): Secondary | ICD-10-CM | POA: Diagnosis present

## 2019-04-05 DIAGNOSIS — R262 Difficulty in walking, not elsewhere classified: Secondary | ICD-10-CM | POA: Diagnosis present

## 2019-04-05 NOTE — Therapy (Addendum)
[place] MAIN [facility] SERVICES [address] [place], [place], [address] Phone: [phone]   Fax:  [phone]  Physical Therapy Treatment/Progress Note/Recertification  Dates of reporting period  11/29/18   to   04/05/19  Patient Details  Name: Michelle Randall MRN: [id number] Date of Birth: Aug 30, 1996 No data recorded  Encounter Date: 04/05/2019  PT End of Session - 04/05/19 1445    Visit Number  [id number]    Number of Visits  179    Date for PT Re-Evaluation  06/28/19    Authorization Type  last goals 3/2, telemedicine update 03/06/19, updated today 04/05/19    Authorization Time Period  12 visits 2/10-03/18/19     Authorization - Visit Number  8    Authorization - Number of Visits  12    PT Start Time  2119    PT Stop Time  1523    PT Time Calculation (min)  46 min    Equipment Utilized During Treatment  Gait belt    Activity Tolerance  Patient tolerated treatment well    Behavior During Therapy  WFL for tasks assessed/performed       Past Medical History:  Diagnosis Date  . Neurogenic bladder   . Neurogenic bowel   . S/P VP shunt   . Spina bifida [facility])     Past Surgical History:  Procedure Laterality Date  . VENTRICULOPERITONEAL SHUNT      There were no vitals filed for this visit.  Subjective Assessment - 04/05/19 1445    Subjective  Patient reports that she is doing well today. Denies pain today as well as falls/stumbles since last visit. Reports that the new AFOs are working well. No specific questions or concerns at this time.    Patient is accompained by:  Family member    Limitations  Walking;Standing;House hold activities;Other (comment)    How long can you stand comfortably?  require use of bilateral crutches    How long can you walk comfortably?  dependent upon terrain    Patient Stated Goals  Patient wants to be able to negotiate uneven terrain at school, cross thresholds in home environment, and negotiate steps in  household     Currently in Pain?  No/denies         [facility] PT Assessment - 04/05/19 1513      6 Minute walk- Post Test   6 Minute Walk Post Test  yes    Modified Borg Scale for Dyspnea  7- Severe shortness of breath or very hard breathing    Perceived Rate of Exertion (Borg)  13- Somewhat hard      6 minute walk test results    Aerobic Endurance Distance Walked  700    Endurance additional comments  Lofstrand crutches      Standardized Balance Assessment   Standardized Balance Assessment  Berg Balance Test;Timed Up and Go Test;Five Times Sit to Stand;10 meter walk test    Five times sit to stand comments   11.3s    10 Meter Walk  self-selected: 16.7s=0.60 m/s fastest: 13.7s=0.73 m/s      Berg Balance Test   Sit to Stand  Able to stand without using hands and stabilize independently    Standing Unsupported  Able to stand safely 2 minutes    Sitting with Back Unsupported but Feet Supported on Floor or Stool  Able to sit safely and securely 2 minutes    Stand to Sit  Sits safely with minimal  use of hands    Transfers  Able to transfer safely, minor use of hands    Standing Unsupported with Eyes Closed  Able to stand 10 seconds safely    Standing Unsupported with Feet Together  Able to place feet together independently and stand 1 minute safely    From Standing, Reach Forward with Outstretched Arm  Can reach forward >12 cm safely (5")    From Standing Position, Pick up Object from [place] to pick up shoe safely and easily    From Standing Position, Turn to Look Behind Over each Shoulder  Looks behind from both sides and weight shifts well    Turn 360 Degrees  Needs assistance while turning    Standing Unsupported, Alternately Place Feet on Step/Stool  Able to complete >2 steps/needs minimal assist    Standing Unsupported, One Foot in Front  Able to take small step independently and hold 30 seconds    Standing on One Leg  Tries to lift leg/unable to hold 3 seconds but remains  standing independently    Total Score  43      Timed Up and Go Test   TUG  Normal TUG    Normal TUG (seconds)  11.7         TREATMENT  Ther-ex  Performed outcome measures with patient including: TUG: 11.7 seconds 76mgait speed: self-selected: 16.7s=0.60 m/s fastest: 13.7s=0.73 m/s BERG: 43/56    6MWT: 700 feet; 5TSTS: 11.3s Updated goals with patient and discussed plan of care; Pilates 100; Hookyling clams x 10; Hooklying manually resisted trunk rotation 3s hold x 10 each direction; Hooklying SLR x 10 bilateral; Hooklying bridges x 10;   Pt educated throughout session about proper posture and technique with outcome measures. Improved exercise technique, movement at target joints, use of target muscles after min to mod verbal, visual, tactile cues. Fatigue continuously monitored throughout. CGA provided for all balance outcome measures for safety;   Performed outcome measures and updated goals with patient today. She demonstrated good maintenance of her outcome measures using the telehealth platform with the exception of a drop in her BERG balance test and a slight drop in her 6MWT distance. She has new AFOs and these will be investigated more in the upcoming sessions. Will continue to work on dynamic balance in the parallel bars and work toward her goal of added safety while navigating curbs. She needs continued PT services to continue improving her balance and maintain her functional strength gains.Pt will benefit from PT services to address deficits in strength, balance, and mobility in order toimprove and maintainfull function at home and school.                       PT Short Term Goals - 04/05/19 1453      PT SHORT TERM GOAL #1   Title  Patient will report compliance with HEP for continued strengthening and stability during functional mobility.     Baseline  HEP compliant    Time  2    Period  Weeks    Status  Achieved        PT Long  Term Goals - 04/05/19 1453      PT LONG TERM GOAL #1   Title  Patient will improve Dynamic Gait Index (DGI) score to > 21/24 for meaningful improvement and low falls risk regarding dynamic walking tasks (revised from > 19/24 )    Baseline  14/24; 11/02/16: 15/24 01/28/17: 18/24  6/5: 19/24; 08/23/17 14/24,  14/24 10/11/17 14/24 12/20/17, 12/27/17/  14/24 9/6 deferred due to precautions; 10/04/18: 17/24; 12/07/18: Deferred as this is a poor goals for patient given inherit limitations and scoring system; 04/05/19: deferred    Time  12    Period  Weeks    Status  Deferred      PT LONG TERM GOAL #2   Title  Patient will increase Berg Balance score by >51/56 points to be considered a low risk for falls for improved safety. (revised from >6 points improvement)    Baseline  11/02/16: 34/56 01/28/17: 39/56 6/5: 45/56; 08/23/2017 = 45/56, 43/56 10/11/17, 43/56 12/20/17, 12/27/17  43/56 6/20: 38/56 no UE support ; 8/8: 40/56 no UE support 9/6: deferred due to precautions; 9/25: 42/56; 12/08/18: 42/56 3/2: 45/56; 03/06/19: Unable to perform over telemedicine; 04/05/19: 43/56    Time  12    Period  Weeks    Status  Partially Met    Target Date  06/28/19      PT LONG TERM GOAL #3   Title  Patient will be able to transfer in and out of a large car with  a high seat with CGA  to improve ability to go to school/doctor visits.     Baseline  Patient requires min A for transfer into large SUV unless it has a step rail on the side; 5/15: pt requires assist with a really high car but can get out of an SUV without assist, typically needs more assits getting in and getting in on the L side; 6/20: if ledge able to do with little to no assistance.  9/6 deferred due to precautions 9/25: if ledge is able to do with little or not assistance; 12/07/18: if ledge is able to do with little or no assistance 3/2: if ledge able to perform; 03/06/19: Unchanged at this time; 04/05/19: Unchanged at this time    Time  12    Period  Weeks    Status   Partially Met    Target Date  06/28/19      PT LONG TERM GOAL #4   Title  Patient will complete a TUG test in < 12 seconds for independent mobility and decreased fall risk     Baseline  11.45; 08/23/2017 = 13.66 sec, 13.40 12/20/17, 12/27/17 12.07 sec; 5/15: 13.42 seconds 6/20: 12 seconds, 10/04/18: 13.0s; 12/07/18: 11.6 seconds; 04/05/19: 11.7s    Time  12    Period  Weeks    Status  Achieved      PT LONG TERM GOAL #5   Title  Patient will improve 6 minute walk distance by > 150 ft for improved return to functional community activities     Baseline  740 01/28/2017: 753f 6/5: 795  730 on 07/19/17; 790 ft on 08/23/18,  740 feet, 730 feet 12/20/17, 12/27/17 650 feet 6/20: 665 8/8: 701 9/6: deferred due to precautions 9/25: 6418f 10/11/18: [id number]/23/20: 715 feet 3/2: will perform next session with new AFO's; 03/06/19:  03/06/19: Unable to perform over telemedicine; 04/05/19: 700'    Time  12    Period  Weeks    Status  Partially Met    Target Date  06/28/19      PT LONG TERM GOAL #6   Title  Patient will improve gait speed to > 1.2 m/s with least restrictive assistive device to return to normal walking speed     Baseline  .76 m/h 01/28/2017: .87  07/19/17 0.44m36m0.77 m/s  on 08/23/17, . 58 m/sec 10/11/17 .73msec 6/20: 77 m/s  8/8: .738m 9/6: terminated due to precuations for HR 9/25: 0.5524m self-selected: 16.2s = 0.62 m/s, fastest: 14.2s = 0.70 m/s; self-selected: 20.1s=0.50 m/s fastest: 13.0s=0.77 m/s; 03/06/19:  03/06/19: Unable to perform over telemedicine; 04/05/19: self-selected: 16.7s=0.60 m/s fastest: 13.7s=0.73 m/s    Time  12    Period  Weeks    Status  Partially Met   06/28/19 target date     PT LONG TERM GOAL #7   Title  Patient (< 60 65ars old) will complete five times sit to stand test in < 10 seconds indicating an increased LE strength and improved balance     Baseline  9.45 sec; 9.63 on 08/23/17, 9.63 sec 12/27/17, 10.07 sec; 12/08/18: 10.2 seconds 3/2: 9 seconds no UE support; 04/05/19: 11.3s     Time  12    Period  Weeks    Status  Partially Met   06/28/19 target date     PT LONG TERM GOAL #8   Title  Patient will be able to ambulate on inclines and grass independenlty with LRAD    Baseline  Can ambulate over grassy inclines with decreased speed and safety requiring need loftstrand curtches and SBA; 5/15: pt reports she has to walk slowly and feels unsteady but that this is improving6/20 : reports cautious and slow walk, able to do mod I9/6:  deffered due to precautions9/25: able to do slowly and with caution due to fear of falling, able to do mod I, 10/25/18:  able to do slowly and with caution due to fear of falling, able to do mod I; 12/07/18: still able to perform with lofstrand crutches but remains a challenge with fear of falling 3/2: uphill more challenging than downhill, grass continues to be a challenge; 03/06/19: Unchanged; 04/05/19: Unchanged    Time  12    Period  Weeks    Status  On-going   06/28/19 target date     PT LONG TERM GOAL  #9   TITLE  Patient will be able to transfer from low chair or stool without UE support independently    Baseline  able to perform independently when not tired, but when fatigued requires support. 10/25/18: Able to perform when legs are not fatigued, mostly limited by balance; 12/07/18: Able to perform from regular height chair and slightly lower surface, limited by balance and better when legs are not fatigued; 3/2: limited when LE's are <90 degree angle; 03/06/19: unchanged; 04/05/19: Unchanged    Time  12    Period  Weeks    Status  On-going   06/28/19 target date     PT LONG TERM GOAL  #10   TITLE  Patient will be able to transfer from the floor to standing independently with use of LRAD    Baseline  difficult on different types of floor. Slippery floors more challenging and how long on the floor. Only need assistance when not able to put crutch against surface. 9/25: can do mod I if immediately stands back up, requires assistance if is in  position for long, more difficulty with harder surfaces; 10/25/18: able to perform on soft surfaces for loftstrand traction, learned how to shorten loftrands so it can be performed on tile without falling    Time  12    Period  Weeks    Status  Achieved   03/21/18 target date     PT LONG TERM GOAL  #11   TITLE  Patient will step up onto a 6" step 5x with AD independently to increase community mobility    Baseline  6/20: challenging to patient ascending steps, supervision ; 8/8: difficult clearing L foot /14: can step up curb independently     Time  12    Period  Weeks    Status  Achieved   01/03/18 target date           Plan - 04/05/19 1453    Clinical Impression Statement  Performed outcome measures and updated goals with patient today. She demonstrated good maintenance of her outcome measures using the telehealth platform with the exception of a drop in her BERG balance test and a slight drop in her 6MWT distance. She has new AFOs and these will be investigated more in the upcoming sessions. Will continue to work on dynamic balance in the parallel bars and work toward her goal of added safety while navigating curbs. She needs continued PT services to continue improving her balance and maintain her functional strength gains.Pt will benefit from PT services to address deficits in strength, balance, and mobility in order toimprove and maintainfull function at home and school.    Rehab Potential  Good    Clinical Impairments Affecting Rehab Potential  weakness and decreased standing balance    PT Frequency  1x / week    PT Duration  12 weeks    PT Treatment/Interventions  Therapeutic exercise;Therapeutic activities;Gait training;Balance training;Stair training;DME Instruction;Neuromuscular re-education;Patient/family education;Functional mobility training;Passive range of motion;Electrical Stimulation;Energy conservation;Orthotic Fit/Training;Manual techniques    PT Next Visit Plan   Continue strength/balance, continue working on step-ups to pratice curb negotiation when back in clinic. Remove AFOs for additional LE strengthening; 6MWT with new AFOs once pt is back in clinic    PT Home Exercise Plan  added seated dynamic core UE and LE lift offs with visual cues using mirror, supine marches, Seated program: [id number]     Consulted and Agree with Plan of Care  Patient       Patient will benefit from skilled therapeutic intervention in order to improve the following deficits and impairments:  Abnormal gait, Decreased balance, Decreased endurance, Difficulty walking, Decreased strength, Decreased activity tolerance, Decreased coordination, Decreased mobility, Decreased range of motion, Impaired flexibility, Postural dysfunction, Improper body mechanics  Visit Diagnosis: Muscle weakness (generalized)  Difficulty in walking, not elsewhere classified     Problem List There are no active problems to display for this patient.  Phillips Grout PT, DPT, GCS  Huprich,Jason 04/06/2019, 11:54 AM  [place] MAIN [facility] SERVICES [address] [place], [place], [address] Phone: [phone]   Fax:  [phone]  Name: CLARE FENNIMORE MRN: [id number] Date of Birth: 01/04/1996

## 2019-04-12 ENCOUNTER — Ambulatory Visit: Payer: Medicaid Other

## 2019-04-12 ENCOUNTER — Other Ambulatory Visit: Payer: Self-pay

## 2019-04-12 DIAGNOSIS — M6281 Muscle weakness (generalized): Secondary | ICD-10-CM | POA: Diagnosis not present

## 2019-04-12 DIAGNOSIS — R262 Difficulty in walking, not elsewhere classified: Secondary | ICD-10-CM

## 2019-04-12 NOTE — Therapy (Signed)
[place] MAIN [facility] SERVICES [address] [place], [place], [address] Phone: [phone]   Fax:  [phone]  Physical Therapy Treatment  Patient Details  Name: NATSUMI WHITSITT MRN: [id number] Date of Birth: [birth year] No data recorded  Encounter Date: 04/12/2019  PT End of Session - 04/12/19 1445    Visit Number  161    Number of Visits  179    Date for PT Re-Evaluation  06/28/19    Authorization Type  last goals 3/2, telemedicine update 03/06/19, updated today 04/05/19    Authorization Time Period  12 visits 03/27/19-06/18/19    Authorization - Visit Number  2    Authorization - Number of Visits  12    PT Start Time  1430    PT Stop Time  1515    PT Time Calculation (min)  45 min    Equipment Utilized During Treatment  Gait belt    Activity Tolerance  Patient tolerated treatment well    Behavior During Therapy  WFL for tasks assessed/performed       Past Medical History:  Diagnosis Date  . Neurogenic bladder   . Neurogenic bowel   . S/P VP shunt   . Spina bifida [facility])     Past Surgical History:  Procedure Laterality Date  . VENTRICULOPERITONEAL SHUNT      There were no vitals filed for this visit.  Subjective Assessment - 04/12/19 1443    Subjective  Patient reports that she is doing well today. Denies pain today as well as falls/stumbles since last visit. Reports that the new AFOs are working well. No specific questions or concerns at this time.    Patient is accompained by:  Family member    Limitations  Walking;Standing;House hold activities;Other (comment)    How long can you stand comfortably?  require use of bilateral crutches    How long can you walk comfortably?  dependent upon terrain    Patient Stated Goals  Patient wants to be able to negotiate uneven terrain at school, cross thresholds in home environment, and negotiate steps in household     Currently in Pain?  No/denies        TREATMENT  Ther-ex   Hooklying manually resisted trunk rotation 3s hold x 10 each direction; Hooklying SLR x 10 bilateral; Hooklying bridges x 10; Standing mini squats in // bars with AFOs' doffed, BUE support x 10, x 15; Split squats alternating forward LE x 10 each; Static squat holds in // bars without UE support 15s on/15s off x 4 cycles;   Neuromuscular Re-education  AFO's doffed for exercises in // bars Wide base of support (WBOS) static balance x 30s, intermittent 2 finger touch to stabilize; Alternating 4" step taps with BUE support x 10 each; 4" step-ups alternating lead LE x 10 each; Side stepping in // bars with BUE support x 4 lengths; Forward/backward stepping in // bars with BUE support x 4 lengths;    Pt educated throughout session about proper posture and technique with exercises. Improved exercise technique, movement at target joints, use of target muscles after min to mod verbal, visual, tactile cues.    Pt demonstrates excellent motivation during session and is able to complete all exercises as instructed by therapist. She demonstrates more difficulty with balance in parallel bars with her AFO's doffed. She demonstrates good form with split squats. Will continue to work on dynamic balance in the parallel bars and work toward her goal of added safety  while navigating curbs.She needs continued PT services to continue improving her balance and maintain her functional strength gains.Pt will benefit from PT services to address deficits in strength, balance, and mobility in order toimprove and maintainfull function at home and school.                       PT Short Term Goals - 04/05/19 1453      PT SHORT TERM GOAL #1   Title  Patient will report compliance with HEP for continued strengthening and stability during functional mobility.     Baseline  HEP compliant    Time  2    Period  Weeks    Status  Achieved        PT Long Term Goals - 04/05/19 1453      PT  LONG TERM GOAL #1   Title  Patient will improve Dynamic Gait Index (DGI) score to > 21/24 for meaningful improvement and low falls risk regarding dynamic walking tasks (revised from > 19/24 )    Baseline  14/24; 11/02/16: 15/24 01/28/17: 18/24 6/5: 19/24; 08/23/17 14/24,  14/24 10/11/17 14/24 12/20/17, 12/27/17/  14/24 9/6 deferred due to precautions; 10/04/18: 17/24; 12/07/18: Deferred as this is a poor goals for patient given inherit limitations and scoring system; 04/05/19: deferred    Time  12    Period  Weeks    Status  Deferred      PT LONG TERM GOAL #2   Title  Patient will increase Berg Balance score by >51/56 points to be considered a low risk for falls for improved safety. (revised from >6 points improvement)    Baseline  11/02/16: 34/56 01/28/17: 39/56 6/5: 45/56; 08/23/2017 = 45/56, 43/56 10/11/17, 43/56 12/20/17, 12/27/17  43/56 6/20: 38/56 no UE support ; 8/8: 40/56 no UE support 9/6: deferred due to precautions; 9/25: 42/56; 12/08/18: 42/56 3/2: 45/56; 03/06/19: Unable to perform over telemedicine; 04/05/19: 43/56    Time  12    Period  Weeks    Status  Partially Met    Target Date  06/28/19      PT LONG TERM GOAL #3   Title  Patient will be able to transfer in and out of a large car with  a high seat with CGA  to improve ability to go to school/doctor visits.     Baseline  Patient requires min A for transfer into large SUV unless it has a step rail on the side; 5/15: pt requires assist with a really high car but can get out of an SUV without assist, typically needs more assits getting in and getting in on the L side; 6/20: if ledge able to do with little to no assistance.  9/6 deferred due to precautions 9/25: if ledge is able to do with little or not assistance; 12/07/18: if ledge is able to do with little or no assistance 3/2: if ledge able to perform; 03/06/19: Unchanged at this time; 04/05/19: Unchanged at this time    Time  12    Period  Weeks    Status  Partially Met    Target Date  06/28/19       PT LONG TERM GOAL #4   Title  Patient will complete a TUG test in < 12 seconds for independent mobility and decreased fall risk     Baseline  11.45; 08/23/2017 = 13.66 sec, 13.40 12/20/17, 12/27/17 12.07 sec; 5/15: 13.42 seconds 6/20: 12 seconds, 10/04/18: 13.0s; 12/07/18: 11.6  seconds; 04/05/19: 11.7s    Time  12    Period  Weeks    Status  Achieved      PT LONG TERM GOAL #5   Title  Patient will improve 6 minute walk distance by > 150 ft for improved return to functional community activities     Baseline  740 01/28/2017: 737f 6/5: 795  730 on 07/19/17; 790 ft on 08/23/18,  740 feet, 730 feet 12/20/17, 12/27/17 650 feet 6/20: 665 8/8: 701 9/6: deferred due to precautions 9/25: 6475f 10/11/18: 64025f1/23/20: 715 feet 3/2: will perform next session with new AFO's; 03/06/19:  03/06/19: Unable to perform over telemedicine; 04/05/19: 700'    Time  12    Period  Weeks    Status  Partially Met    Target Date  06/28/19      PT LONG TERM GOAL #6   Title  Patient will improve gait speed to > 1.2 m/s with least restrictive assistive device to return to normal walking speed     Baseline  .76 m/h 01/28/2017: .87  07/19/17 0.65m16m0.77 m/s on 08/23/17, . 58 m/sec 10/11/17 .49m/64m6/20: 77 m/s  8/8: .44m/s24m: terminated due to precuations for HR 9/25: 0.49m/s,11mf-selected: 16.2s = 0.62 m/s, fastest: 14.2s = 0.70 m/s; self-selected: 20.1s=0.50 m/s fastest: 13.0s=0.77 m/s; 03/06/19:  03/06/19: Unable to perform over telemedicine; 04/05/19: self-selected: 16.7s=0.60 m/s fastest: 13.7s=0.73 m/s    Time  12    Period  Weeks    Status  Partially Met   06/28/19 target date     PT LONG TERM GOAL #7   Title  Patient (< 60 year70old) will complete five times sit to stand test in < 10 seconds indicating an increased LE strength and improved balance     Baseline  9.45 sec; 9.63 on 08/23/17, 9.63 sec 12/27/17, 10.07 sec; 12/08/18: 10.2 seconds 3/2: 9 seconds no UE support; 04/05/19: 11.3s    Time  12    Period  Weeks    Status   Partially Met   06/28/19 target date     PT LONG TERM GOAL #8   Title  Patient will be able to ambulate on inclines and grass independenlty with LRAD    Baseline  Can ambulate over grassy inclines with decreased speed and safety requiring need loftstrand curtches and SBA; 5/15: pt reports she has to walk slowly and feels unsteady but that this is improving6/20 : reports cautious and slow walk, able to do mod I9/6:  deffered due to precautions9/25: able to do slowly and with caution due to fear of falling, able to do mod I, 10/25/18:  able to do slowly and with caution due to fear of falling, able to do mod I; 12/07/18: still able to perform with lofstrand crutches but remains a challenge with fear of falling 3/2: uphill more challenging than downhill, grass continues to be a challenge; 03/06/19: Unchanged; 04/05/19: Unchanged    Time  12    Period  Weeks    Status  On-going   06/28/19 target date     PT LONG TERM GOAL  #9   TITLE  Patient will be able to transfer from low chair or stool without UE support independently    Baseline  able to perform independently when not tired, but when fatigued requires support. 10/25/18: Able to perform when legs are not fatigued, mostly limited by balance; 12/07/18: Able to perform from regular height chair and slightly lower surface, limited by  balance and better when legs are not fatigued; 3/2: limited when LE's are <90 degree angle; 03/06/19: unchanged; 04/05/19: Unchanged    Time  12    Period  Weeks    Status  On-going   06/28/19 target date     PT LONG TERM GOAL  #10   TITLE  Patient will be able to transfer from the floor to standing independently with use of LRAD    Baseline  difficult on different types of floor. Slippery floors more challenging and how long on the floor. Only need assistance when not able to put crutch against surface. 9/25: can do mod I if immediately stands back up, requires assistance if is in position for long, more difficulty with  harder surfaces; 10/25/18: able to perform on soft surfaces for loftstrand traction, learned how to shorten loftrands so it can be performed on tile without falling    Time  12    Period  Weeks    Status  Achieved   03/21/18 target date     PT LONG TERM GOAL  #11   TITLE  Patient will step up onto a 6" step 5x with AD independently to increase community mobility    Baseline  6/20: challenging to patient ascending steps, supervision ; 8/8: difficult clearing L foot /14: can step up curb independently     Time  12    Period  Weeks    Status  Achieved   01/03/18 target date           Plan - 04/12/19 1446    Clinical Impression Statement  Pt demonstrates excellent motivation during session and is able to complete all exercises as instructed by therapist. She demonstrates more difficulty with balance in parallel bars with her AFO's doffed. She demonstrates good form with split squats. Will continue to work on dynamic balance in the parallel bars and work toward her goal of added safety while navigating curbs.She needs continued PT services to continue improving her balance and maintain her functional strength gains.Pt will benefit from PT services to address deficits in strength, balance, and mobility in order toimprove and maintainfull function at home and school.    Rehab Potential  Good    Clinical Impairments Affecting Rehab Potential  weakness and decreased standing balance    PT Frequency  1x / week    PT Duration  12 weeks    PT Treatment/Interventions  Therapeutic exercise;Therapeutic activities;Gait training;Balance training;Stair training;DME Instruction;Neuromuscular re-education;Patient/family education;Functional mobility training;Passive range of motion;Electrical Stimulation;Energy conservation;Orthotic Fit/Training;Manual techniques    PT Next Visit Plan  Continue strength/balance, continue working on step-ups to pratice curb negotiation when back in clinic. Remove AFOs for  additional LE strengthening; 6MWT with new AFOs once pt is back in clinic    PT Home Exercise Plan  added seated dynamic core UE and LE lift offs with visual cues using mirror, supine marches, Seated program: [id number]     Consulted and Agree with Plan of Care  Patient       Patient will benefit from skilled therapeutic intervention in order to improve the following deficits and impairments:  Abnormal gait, Decreased balance, Decreased endurance, Difficulty walking, Decreased strength, Decreased activity tolerance, Decreased coordination, Decreased mobility, Decreased range of motion, Impaired flexibility, Postural dysfunction, Improper body mechanics  Visit Diagnosis: Muscle weakness (generalized)  Difficulty in walking, not elsewhere classified     Problem List There are no active problems to display for this patient.  Lyndel Safe Aiven Kampe PT, DPT, GCS  Alera Quevedo 04/12/2019, 3:49 PM  [place] [facility] MAIN [facility] SERVICES [address] [place], [place], [address] Phone: [phone]   Fax:  [phone]  Name: MONTIE GELARDI MRN: [id number] Date of Birth: May 06, 1996

## 2019-04-19 ENCOUNTER — Ambulatory Visit: Payer: Medicaid Other | Attending: Student

## 2019-04-19 ENCOUNTER — Other Ambulatory Visit: Payer: Self-pay

## 2019-04-19 DIAGNOSIS — M6281 Muscle weakness (generalized): Secondary | ICD-10-CM | POA: Diagnosis present

## 2019-04-19 DIAGNOSIS — R262 Difficulty in walking, not elsewhere classified: Secondary | ICD-10-CM

## 2019-04-19 DIAGNOSIS — R278 Other lack of coordination: Secondary | ICD-10-CM | POA: Diagnosis present

## 2019-04-19 DIAGNOSIS — Z9181 History of falling: Secondary | ICD-10-CM | POA: Insufficient documentation

## 2019-04-19 DIAGNOSIS — R531 Weakness: Secondary | ICD-10-CM | POA: Insufficient documentation

## 2019-04-19 NOTE — Therapy (Signed)
[facility] SERVICES [address] [place], [place], [address] Phone: [phone]   Fax:  [phone]  Physical Therapy Treatment  Patient Details  Name: Michelle Randall MRN: [id number] Date of Birth: 05/21/1996 No data recorded  Encounter Date: 04/19/2019  PT End of Session - 04/19/19 1625    Visit Number  162    Number of Visits  179    Date for PT Re-Evaluation  06/28/19    Authorization Type  last goals 3/2, telemedicine update 03/06/19, updated today 04/05/19    Authorization Time Period  12 visits 03/27/19-06/18/19    Authorization - Visit Number  3    Authorization - Number of Visits  12    PT Start Time  9166    PT Stop Time  1616    PT Time Calculation (min)  46 min    Equipment Utilized During Treatment  Gait belt    Activity Tolerance  Patient tolerated treatment well    Behavior During Therapy  WFL for tasks assessed/performed       Past Medical History:  Diagnosis Date  . Neurogenic bladder   . Neurogenic bowel   . S/P VP shunt   . Spina bifida [facility])     Past Surgical History:  Procedure Laterality Date  . VENTRICULOPERITONEAL SHUNT      There were no vitals filed for this visit.  Subjective Assessment - 04/19/19 1624    Subjective  Patient reports she is doing well, Has been compliant with HEP and has been using her new AFOs without complaint.     Patient is accompained by:  Family member    Limitations  Walking;Standing;House hold activities;Other (comment)    How long can you stand comfortably?  require use of bilateral crutches    How long can you walk comfortably?  dependent upon terrain    Patient Stated Goals  Patient wants to be able to negotiate uneven terrain at school, cross thresholds in home environment, and negotiate steps in household     Currently in Pain?  No/denies       TREATMENT   Ther-ex  Hooklying with overpressure to end range trunk rotation 60 seconds  Hooklying SLR x 10  bilateral, PT stabilization to opp LE to improve reciprocating motion Hooklying bridges x 10;PT stabilization to [employer] for lateral abdominal activation and stability 10x each LE hooklying hand slides up knees for core activation 10x  Sit to stand with green pad under LLE 10x with assistance for pelvis weight acceptance onto RLE.   Prone: hip flexor stretch with overpressure 2 minutes each side with increasing muscle lengthening with progression of time Prone: hamstring curl AAROM 10x each LE  Neuromuscular Re-education  AFO's doffed for exercises Green pad under LLE  standing weight shift onto RLE with lofstrands and doffed AFOs: 2x 45 seconds Static stand marching with noted collapse with RLE single leg stance 10x  Ambulate 90 ft with turning at end of each 30 ft with Lofstrand crutches, noted decreased velocity with increased need for focus/attention to maintain stability, no episodes of knee hyperextension.      Pt educated throughout session about proper posture and technique with exercises. Improved exercise technique, movement at target joints, use of target muscles after min to mod verbal, visual, tactile cues.                              PT  Education - 04/19/19 1625    Education provided  Yes    Education Details  exercise technique, stability, mobility    Person(s) Educated  Patient    Methods  Explanation;Demonstration;Tactile cues;Verbal cues    Comprehension  Verbalized understanding;Returned demonstration;Verbal cues required;Tactile cues required       PT Short Term Goals - 04/05/19 1453      PT SHORT TERM GOAL #1   Title  Patient will report compliance with HEP for continued strengthening and stability during functional mobility.     Baseline  HEP compliant    Time  2    Period  Weeks    Status  Achieved        PT Long Term Goals - 04/05/19 1453      PT LONG TERM GOAL #1   Title  Patient will improve Dynamic Gait Index (DGI)  score to > 21/24 for meaningful improvement and low falls risk regarding dynamic walking tasks (revised from > 19/24 )    Baseline  14/24; 11/02/16: 15/24 01/28/17: 18/24 6/5: 19/24; 08/23/17 14/24,  14/24 10/11/17 14/24 12/20/17, 12/27/17/  14/24 9/6 deferred due to precautions; 10/04/18: 17/24; 12/07/18: Deferred as this is a poor goals for patient given inherit limitations and scoring system; 04/05/19: deferred    Time  12    Period  Weeks    Status  Deferred      PT LONG TERM GOAL #2   Title  Patient will increase Berg Balance score by >51/56 points to be considered a low risk for falls for improved safety. (revised from >6 points improvement)    Baseline  11/02/16: 34/56 01/28/17: 39/56 6/5: 45/56; 08/23/2017 = 45/56, 43/56 10/11/17, 43/56 12/20/17, 12/27/17  43/56 6/20: 38/56 no UE support ; 8/8: 40/56 no UE support 9/6: deferred due to precautions; 9/25: 42/56; 12/08/18: 42/56 3/2: 45/56; 03/06/19: Unable to perform over telemedicine; 04/05/19: 43/56    Time  12    Period  Weeks    Status  Partially Met    Target Date  06/28/19      PT LONG TERM GOAL #3   Title  Patient will be able to transfer in and out of a large car with  a high seat with CGA  to improve ability to go to school/doctor visits.     Baseline  Patient requires min A for transfer into large SUV unless it has a step rail on the side; 5/15: pt requires assist with a really high car but can get out of an SUV without assist, typically needs more assits getting in and getting in on the L side; 6/20: if ledge able to do with little to no assistance.  9/6 deferred due to precautions 9/25: if ledge is able to do with little or not assistance; 12/07/18: if ledge is able to do with little or no assistance 3/2: if ledge able to perform; 03/06/19: Unchanged at this time; 04/05/19: Unchanged at this time    Time  12    Period  Weeks    Status  Partially Met    Target Date  06/28/19      PT LONG TERM GOAL #4   Title  Patient will complete a TUG test in  < 12 seconds for independent mobility and decreased fall risk     Baseline  11.45; 08/23/2017 = 13.66 sec, 13.40 12/20/17, 12/27/17 12.07 sec; 5/15: 13.42 seconds 6/20: 12 seconds, 10/04/18: 13.0s; 12/07/18: 11.6 seconds; 04/05/19: 11.7s    Time  12  Period  Weeks    Status  Achieved      PT LONG TERM GOAL #5   Title  Patient will improve 6 minute walk distance by > 150 ft for improved return to functional community activities     Baseline  740 01/28/2017: 764f 6/5: 795  730 on 07/19/17; 790 ft on 08/23/18,  740 feet, 730 feet 12/20/17, 12/27/17 650 feet 6/20: 665 8/8: 701 9/6: deferred due to precautions 9/25: 641f 10/11/18: 64032f1/23/20: 715 feet 3/2: will perform next session with new AFO's; 03/06/19:  03/06/19: Unable to perform over telemedicine; 04/05/19: 700'    Time  12    Period  Weeks    Status  Partially Met    Target Date  06/28/19      PT LONG TERM GOAL #6   Title  Patient will improve gait speed to > 1.2 m/s with least restrictive assistive device to return to normal walking speed     Baseline  .76 m/h 01/28/2017: .87  07/19/17 0.48m36m0.77 m/s on 08/23/17, . 58 m/sec 10/11/17 .23m/61m6/20: 77 m/s  8/8: .74m/s86m: terminated due to precuations for HR 9/25: 0.84m/s,54mf-selected: 16.2s = 0.62 m/s, fastest: 14.2s = 0.70 m/s; self-selected: 20.1s=0.50 m/s fastest: 13.0s=0.77 m/s; 03/06/19:  03/06/19: Unable to perform over telemedicine; 04/05/19: self-selected: 16.7s=0.60 m/s fastest: 13.7s=0.73 m/s    Time  12    Period  Weeks    Status  Partially Met   06/28/19 target date     PT LONG TERM GOAL #7   Title  Patient (< 60 year14old) will complete five times sit to stand test in < 10 seconds indicating an increased LE strength and improved balance     Baseline  9.45 sec; 9.63 on 08/23/17, 9.63 sec 12/27/17, 10.07 sec; 12/08/18: 10.2 seconds 3/2: 9 seconds no UE support; 04/05/19: 11.3s    Time  12    Period  Weeks    Status  Partially Met   06/28/19 target date     PT LONG TERM GOAL #8   Title   Patient will be able to ambulate on inclines and grass independenlty with LRAD    Baseline  Can ambulate over grassy inclines with decreased speed and safety requiring need loftstrand curtches and SBA; 5/15: pt reports she has to walk slowly and feels unsteady but that this is improving6/20 : reports cautious and slow walk, able to do mod I9/6:  deffered due to precautions9/25: able to do slowly and with caution due to fear of falling, able to do mod I, 10/25/18:  able to do slowly and with caution due to fear of falling, able to do mod I; 12/07/18: still able to perform with lofstrand crutches but remains a challenge with fear of falling 3/2: uphill more challenging than downhill, grass continues to be a challenge; 03/06/19: Unchanged; 04/05/19: Unchanged    Time  12    Period  Weeks    Status  On-going   06/28/19 target date     PT LONG TERM GOAL  #9   TITLE  Patient will be able to transfer from low chair or stool without UE support independently    Baseline  able to perform independently when not tired, but when fatigued requires support. 10/25/18: Able to perform when legs are not fatigued, mostly limited by balance; 12/07/18: Able to perform from regular height chair and slightly lower surface, limited by balance and better when legs are not fatigued; 3/2: limited when LE's  are <90 degree angle; 03/06/19: unchanged; 04/05/19: Unchanged    Time  12    Period  Weeks    Status  On-going   06/28/19 target date     PT LONG TERM GOAL  #10   TITLE  Patient will be able to transfer from the floor to standing independently with use of LRAD    Baseline  difficult on different types of floor. Slippery floors more challenging and how long on the floor. Only need assistance when not able to put crutch against surface. 9/25: can do mod I if immediately stands back up, requires assistance if is in position for long, more difficulty with harder surfaces; 10/25/18: able to perform on soft surfaces for loftstrand  traction, learned how to shorten loftrands so it can be performed on tile without falling    Time  12    Period  Weeks    Status  Achieved   03/21/18 target date     PT LONG TERM GOAL  #11   TITLE  Patient will step up onto a 6" step 5x with AD independently to increase community mobility    Baseline  6/20: challenging to patient ascending steps, supervision ; 8/8: difficult clearing L foot /14: can step up curb independently     Time  12    Period  Weeks    Status  Achieved   01/03/18 target date           Plan - 04/19/19 1627    Clinical Impression Statement  Patient presents to physical therapy with fantastic motivation and compliance with HEP. She is challenged with single limb stability with RLE, interventions focused on stability and muscle activation performed with increasing fatigue with repetition.  Prolonged hold hip flexor lengthening improved patient posture in standing and with increased step length. She needs continued PT services to continue improving her balance and maintain her functional strength gains. Pt will benefit from PT services to address deficits in strength, balance, and mobility in order to improve and maintain full function at home and school.      Rehab Potential  Good    Clinical Impairments Affecting Rehab Potential  weakness and decreased standing balance    PT Frequency  1x / week    PT Duration  12 weeks    PT Treatment/Interventions  Therapeutic exercise;Therapeutic activities;Gait training;Balance training;Stair training;DME Instruction;Neuromuscular re-education;Patient/family education;Functional mobility training;Passive range of motion;Electrical Stimulation;Energy conservation;Orthotic Fit/Training;Manual techniques    PT Next Visit Plan  Continue strength/balance, continue working on step-ups to pratice curb negotiation when back in clinic. Remove AFOs for additional LE strengthening; 6MWT with new AFOs once pt is back in clinic    PT Home  Exercise Plan  added seated dynamic core UE and LE lift offs with visual cues using mirror, supine marches, Seated program: [id number]     Consulted and Agree with Plan of Care  Patient       Patient will benefit from skilled therapeutic intervention in order to improve the following deficits and impairments:  Abnormal gait, Decreased balance, Decreased endurance, Difficulty walking, Decreased strength, Decreased activity tolerance, Decreased coordination, Decreased mobility, Decreased range of motion, Impaired flexibility, Postural dysfunction, Improper body mechanics  Visit Diagnosis: Muscle weakness (generalized)  Difficulty in walking, not elsewhere classified  Other lack of coordination  Personal history of fall     Problem List There are no active problems to display for this patient.  Janna Arch, PT, DPT   04/19/2019, 4:28 PM  [facility] SERVICES [address] [place], [place], [address] Phone: [phone]   Fax:  [phone]  Name: Michelle Randall MRN: [id number] Date of Birth: 14-May-1996

## 2019-04-26 ENCOUNTER — Other Ambulatory Visit: Payer: Self-pay

## 2019-04-26 ENCOUNTER — Ambulatory Visit: Payer: Medicaid Other

## 2019-04-26 DIAGNOSIS — M6281 Muscle weakness (generalized): Secondary | ICD-10-CM

## 2019-04-26 DIAGNOSIS — R262 Difficulty in walking, not elsewhere classified: Secondary | ICD-10-CM

## 2019-04-26 NOTE — Therapy (Signed)
[place] MAIN [facility] SERVICES [address] [place], [place], [address] Phone: [phone]   Fax:  [phone]  Physical Therapy Treatment  Patient Details  Name: Michelle Randall MRN: [id number] Date of Birth: 06/27/1996 No data recorded  Encounter Date: 04/26/2019  PT End of Session - 04/26/19 1444    Visit Number  163    Number of Visits  179    Date for PT Re-Evaluation  06/28/19    Authorization Type  last goals 04/05/19    Authorization Time Period  12 visits 03/27/19-06/18/19    Authorization - Visit Number  4    Authorization - Number of Visits  12    PT Start Time  8938    PT Stop Time  1527    PT Time Calculation (min)  45 min    Equipment Utilized During Treatment  Gait belt    Activity Tolerance  Patient tolerated treatment well    Behavior During Therapy  WFL for tasks assessed/performed       Past Medical History:  Diagnosis Date  . Neurogenic bladder   . Neurogenic bowel   . S/P VP shunt   . Spina bifida [facility])     Past Surgical History:  Procedure Laterality Date  . VENTRICULOPERITONEAL SHUNT      There were no vitals filed for this visit.  Subjective Assessment - 04/26/19 1444    Subjective  Patient reports she is doing well, Has been compliant with HEP and has been using her new AFOs without complaint. No falls or LOB. No pain. No specific questions or concerns at this time    Patient is accompained by:  Family member    Limitations  Walking;Standing;House hold activities;Other (comment)    How long can you stand comfortably?  require use of bilateral crutches    How long can you walk comfortably?  dependent upon terrain    Patient Stated Goals  Patient wants to be able to negotiate uneven terrain at school, cross thresholds in home environment, and negotiate steps in household     Currently in Pain?  No/denies           TREATMENT   Ther-ex Mini squats in // bars with BUE support x 10, cues to keep  knees separated; 6" step-ups alternating lead LE x 5 each; Ambulate 90 ft with turning at end of each 30 ft with Lofstrand crutches, noted decreased velocity with increased need for focus/attention to maintain stability, no episodes of knee hyperextension.  Partial split squats in // bars with BUE support alternating forward LE x 10 each; Single leg quantum leg press 75# x 20, 90# x 20 bilateral; Gait in rehab gym x 60' with lofstrand crutches and no AFOs;   Neuromuscular Re-education AFO's doffed for exercises in // bars Wide base of support (WBOS) static balance x 30s, intermittent 2 finger touch to stabilize; Wide base of support (WBOS) lateral weight shifting intermittent 2 finger touch to stabilize; Wide base of support (WBOS) balance with horizontal and then vertical head turns x 30s each, intermittent 2 finger touch to stabilize; Staggered stance static balance alternating forward LE x 30s each; Staggered stance forward/backward weight shifting alternating forward LE x 30s each; Alternating 6" step taps with BUE support x 10 each;   Pt educated throughout session about proper posture and technique with exercises. Improved exercise technique, movement at target joints, use of target muscles after min to mod verbal, visual, tactile cues.  Pt demonstrates excellent motivation during session and is able to complete all exercises as instructed by therapist. She demonstrates good form with split squats. She is able to perform leg press today. Will continue to work on dynamic balance in the parallel bars and work toward her goal of added safety while navigating curbs.She needs continued PT services to continue improving her balance and maintain her functional strength gains.Pt will benefit from PT services to address deficits in strength, balance, and mobility in order toimprove and maintainfull function at home and school.                      PT Short Term Goals  - 04/05/19 1453      PT SHORT TERM GOAL #1   Title  Patient will report compliance with HEP for continued strengthening and stability during functional mobility.     Baseline  HEP compliant    Time  2    Period  Weeks    Status  Achieved        PT Long Term Goals - 04/05/19 1453      PT LONG TERM GOAL #1   Title  Patient will improve Dynamic Gait Index (DGI) score to > 21/24 for meaningful improvement and low falls risk regarding dynamic walking tasks (revised from > 19/24 )    Baseline  14/24; 11/02/16: 15/24 01/28/17: 18/24 6/5: 19/24; 08/23/17 14/24,  14/24 10/11/17 14/24 12/20/17, 12/27/17/  14/24 9/6 deferred due to precautions; 10/04/18: 17/24; 12/07/18: Deferred as this is a poor goals for patient given inherit limitations and scoring system; 04/05/19: deferred    Time  12    Period  Weeks    Status  Deferred      PT LONG TERM GOAL #2   Title  Patient will increase Berg Balance score by >51/56 points to be considered a low risk for falls for improved safety. (revised from >6 points improvement)    Baseline  11/02/16: 34/56 01/28/17: 39/56 6/5: 45/56; 08/23/2017 = 45/56, 43/56 10/11/17, 43/56 12/20/17, 12/27/17  43/56 6/20: 38/56 no UE support ; 8/8: 40/56 no UE support 9/6: deferred due to precautions; 9/25: 42/56; 12/08/18: 42/56 3/2: 45/56; 03/06/19: Unable to perform over telemedicine; 04/05/19: 43/56    Time  12    Period  Weeks    Status  Partially Met    Target Date  06/28/19      PT LONG TERM GOAL #3   Title  Patient will be able to transfer in and out of a large car with  a high seat with CGA  to improve ability to go to school/doctor visits.     Baseline  Patient requires min A for transfer into large SUV unless it has a step rail on the side; 5/15: pt requires assist with a really high car but can get out of an SUV without assist, typically needs more assits getting in and getting in on the L side; 6/20: if ledge able to do with little to no assistance.  9/6 deferred due to  precautions 9/25: if ledge is able to do with little or not assistance; 12/07/18: if ledge is able to do with little or no assistance 3/2: if ledge able to perform; 03/06/19: Unchanged at this time; 04/05/19: Unchanged at this time    Time  12    Period  Weeks    Status  Partially Met    Target Date  06/28/19      PT LONG  TERM GOAL #4   Title  Patient will complete a TUG test in < 12 seconds for independent mobility and decreased fall risk     Baseline  11.45; 08/23/2017 = 13.66 sec, 13.40 12/20/17, 12/27/17 12.07 sec; 5/15: 13.42 seconds 6/20: 12 seconds, 10/04/18: 13.0s; 12/07/18: 11.6 seconds; 04/05/19: 11.7s    Time  12    Period  Weeks    Status  Achieved      PT LONG TERM GOAL #5   Title  Patient will improve 6 minute walk distance by > 150 ft for improved return to functional community activities     Baseline  740 01/28/2017: 763f 6/5: 795  730 on 07/19/17; 790 ft on 08/23/18,  740 feet, 730 feet 12/20/17, 12/27/17 650 feet 6/20: 665 8/8: 701 9/6: deferred due to precautions 9/25: [id number] 10/11/18: [id number]/23/20: 715 feet 3/2: will perform next session with new AFO's; 03/06/19:  03/06/19: Unable to perform over telemedicine; 04/05/19: 700'    Time  12    Period  Weeks    Status  Partially Met    Target Date  06/28/19      PT LONG TERM GOAL #6   Title  Patient will improve gait speed to > 1.2 m/s with least restrictive assistive device to return to normal walking speed     Baseline  .76 m/h 01/28/2017: .87  07/19/17 0.48m3m0.77 m/s on 08/23/17, . 58 m/sec 10/11/17 .72m/26m6/20: 77 m/s  8/8: .74m/s70m: terminated due to precuations for HR 9/25: 0.1m/s,28mf-selected: 16.2s = 0.62 m/s, fastest: 14.2s = 0.70 m/s; self-selected: 20.1s=0.50 m/s fastest: 13.0s=0.77 m/s; 03/06/19:  03/06/19: Unable to perform over telemedicine; 04/05/19: self-selected: 16.7s=0.60 m/s fastest: 13.7s=0.73 m/s    Time  12    Period  Weeks    Status  Partially Met   06/28/19 target date     PT LONG TERM GOAL #7   Title  Patient  (< 61 year15old) will complete five times sit to stand test in < 10 seconds indicating an increased LE strength and improved balance     Baseline  9.45 sec; 9.63 on 08/23/17, 9.63 sec 12/27/17, 10.07 sec; 12/08/18: 10.2 seconds 3/2: 9 seconds no UE support; 04/05/19: 11.3s    Time  12    Period  Weeks    Status  Partially Met   06/28/19 target date     PT LONG TERM GOAL #8   Title  Patient will be able to ambulate on inclines and grass independenlty with LRAD    Baseline  Can ambulate over grassy inclines with decreased speed and safety requiring need loftstrand curtches and SBA; 5/15: pt reports she has to walk slowly and feels unsteady but that this is improving6/20 : reports cautious and slow walk, able to do mod I9/6:  deffered due to precautions9/25: able to do slowly and with caution due to fear of falling, able to do mod I, 10/25/18:  able to do slowly and with caution due to fear of falling, able to do mod I; 12/07/18: still able to perform with lofstrand crutches but remains a challenge with fear of falling 3/2: uphill more challenging than downhill, grass continues to be a challenge; 03/06/19: Unchanged; 04/05/19: Unchanged    Time  12    Period  Weeks    Status  On-going   06/28/19 target date     PT LONG TERM GOAL  #9   TITLE  Patient will be able to transfer from low chair  or stool without UE support independently    Baseline  able to perform independently when not tired, but when fatigued requires support. 10/25/18: Able to perform when legs are not fatigued, mostly limited by balance; 12/07/18: Able to perform from regular height chair and slightly lower surface, limited by balance and better when legs are not fatigued; 3/2: limited when LE's are <90 degree angle; 03/06/19: unchanged; 04/05/19: Unchanged    Time  12    Period  Weeks    Status  On-going   06/28/19 target date     PT LONG TERM GOAL  #10   TITLE  Patient will be able to transfer from the floor to standing independently with  use of LRAD    Baseline  difficult on different types of floor. Slippery floors more challenging and how long on the floor. Only need assistance when not able to put crutch against surface. 9/25: can do mod I if immediately stands back up, requires assistance if is in position for long, more difficulty with harder surfaces; 10/25/18: able to perform on soft surfaces for loftstrand traction, learned how to shorten loftrands so it can be performed on tile without falling    Time  12    Period  Weeks    Status  Achieved   03/21/18 target date     PT LONG TERM GOAL  #11   TITLE  Patient will step up onto a 6" step 5x with AD independently to increase community mobility    Baseline  6/20: challenging to patient ascending steps, supervision ; 8/8: difficult clearing L foot /14: can step up curb independently     Time  12    Period  Weeks    Status  Achieved   01/03/18 target date           Plan - 04/26/19 1445    Clinical Impression Statement  Pt demonstrates excellent motivation during session and is able to complete all exercises as instructed by therapist. She demonstrates good form with split squats. She is able to perform leg press today. Will continue to work on dynamic balance in the parallel bars and work toward her goal of added safety while navigating curbs. She needs continued PT services to continue improving her balance and maintain her functional strength gains. Pt will benefit from PT services to address deficits in strength, balance, and mobility in order to improve and maintain full function at home and school.    Rehab Potential  Good    Clinical Impairments Affecting Rehab Potential  weakness and decreased standing balance    PT Frequency  1x / week    PT Duration  12 weeks    PT Treatment/Interventions  Therapeutic exercise;Therapeutic activities;Gait training;Balance training;Stair training;DME Instruction;Neuromuscular re-education;Patient/family education;Functional mobility  training;Passive range of motion;Electrical Stimulation;Energy conservation;Orthotic Fit/Training;Manual techniques    PT Next Visit Plan  Continue strength/balance, continue working on step-ups to pratice curb negotiation when back in clinic. Remove AFOs for additional LE strengthening; 6MWT with new AFOs once pt is back in clinic    PT Home Exercise Plan  added seated dynamic core UE and LE lift offs with visual cues using mirror, supine marches, Seated program: [id number]     Consulted and Agree with Plan of Care  Patient       Patient will benefit from skilled therapeutic intervention in order to improve the following deficits and impairments:  Abnormal gait, Decreased balance, Decreased endurance, Difficulty walking, Decreased strength, Decreased activity tolerance, Decreased coordination,  Decreased mobility, Decreased range of motion, Impaired flexibility, Postural dysfunction, Improper body mechanics  Visit Diagnosis: Muscle weakness (generalized)  Difficulty in walking, not elsewhere classified     Problem List There are no active problems to display for this patient.  Phillips Grout PT, DPT, GCS  Huprich,Jason 04/26/2019, 4:22 PM  [place] MAIN [facility] SERVICES [address] [place], [place], [address] Phone: [phone]   Fax:  [phone]  Name: Michelle Randall MRN: [id number] Date of Birth: [birth year]

## 2019-05-03 ENCOUNTER — Other Ambulatory Visit: Payer: Self-pay

## 2019-05-03 ENCOUNTER — Ambulatory Visit: Payer: Medicaid Other

## 2019-05-03 DIAGNOSIS — R278 Other lack of coordination: Secondary | ICD-10-CM

## 2019-05-03 DIAGNOSIS — R262 Difficulty in walking, not elsewhere classified: Secondary | ICD-10-CM

## 2019-05-03 DIAGNOSIS — R531 Weakness: Secondary | ICD-10-CM

## 2019-05-03 DIAGNOSIS — M6281 Muscle weakness (generalized): Secondary | ICD-10-CM | POA: Diagnosis not present

## 2019-05-03 NOTE — Therapy (Signed)
[place] MAIN [facility] SERVICES [address] [place], [place], [address] Phone: [phone]   Fax:  [phone]  Physical Therapy Treatment  Patient Details  Name: Michelle Randall MRN: [id number] Date of Birth: 04-09-1996 No data recorded  Encounter Date: 05/03/2019  PT End of Session - 05/03/19 1433    Visit Number  [id number]    Number of Visits  179    Date for PT Re-Evaluation  06/28/19    Authorization Type  last goals 04/05/19    Authorization Time Period  12 visits 03/27/19-06/18/19    Authorization - Visit Number  4    Authorization - Number of Visits  12    PT Start Time  1435    PT Stop Time  1520    PT Time Calculation (min)  45 min    Equipment Utilized During Treatment  Gait belt    Activity Tolerance  Patient tolerated treatment well    Behavior During Therapy  WFL for tasks assessed/performed       Past Medical History:  Diagnosis Date  . Neurogenic bladder   . Neurogenic bowel   . S/P VP shunt   . Spina bifida [facility])     Past Surgical History:  Procedure Laterality Date  . VENTRICULOPERITONEAL SHUNT      There were no vitals filed for this visit.  Subjective Assessment - 05/03/19 1523    Subjective  Patient reported that she is doing well today, no complaints, comes wearing her AFOs, no falls to report    Limitations  Walking;Standing;House hold activities;Other (comment)    How long can you stand comfortably?  require use of bilateral crutches    How long can you walk comfortably?  dependent upon terrain    Patient Stated Goals  Patient wants to be able to negotiate uneven terrain at school, cross thresholds in home environment, and negotiate steps in household     Currently in Pain?  No/denies       TREATMENT   Ther-ex  (doffing of AFOs) Mini squats in // bars with BUE support x 10, cues to keep knees separated; 6" step-ups alternating lead LE x 5 each; Ambulate 90 ft with turning at end of each 30 ft with  Lofstrand crutches, noted decreased velocity with increased need for focus/attention to maintain stability, no episodes of knee hyperextension.  Partial split squats in // bars with BUE support alternating forward LE x 10 each; Single leg quantum leg press 75# x 20, 90# x 20 bilateral; Gait in rehab gym x 60' with lofstrand crutches and no AFOs;      Neuromuscular Re-education  AFO's doffed for exercises in // bars Wide base of support (WBOS) static balance x 30s, intermittent 2 finger touch to stabilize; Wide base of support (WBOS) lateral weight shifting intermittent 2 finger touch to stabilize; Wide base of support (WBOS) balance with horizontal and then vertical head turns x 30s each, intermittent 2 finger touch to stabilize; Staggered stance static balance alternating forward LE x 30s each; Staggered stance forward/backward weight shifting alternating forward LE x 30s each; Alternating 6" step taps with BUE support x 12 each; Pink hedgehog taps  With BUE support, cues to shift weight through disc      Pt educated throughout session about proper posture and technique with exercises. Improved exercise technique, movement at target joints, use of target muscles after min verbal, visual, tactile cues.    Pt demonstrates excellent motivation during session, in  good spirits and willing to attempt any exercises. With fatigue pt demonstrated decreased LLE strength, decreased foot clearance, and increased hip drop during exercise repetitions. Very few rest breaks provided to improve endurance for activities. Pt ambulation without AFOs with decreased gait velocity and wider base of support but no instances of buckling/knee extension noted. Pt will benefit from PT services to address deficits in strength, balance, and mobility in order to improve and maintain full function at home and school.  Patient also requesting a PT note to speak to pt necessity of a motorized wheelchair that also has height  adjustments to improve ability to participate in recreational, ADLs, and functional activities.      PT Education - 05/03/19 1433    Education provided  Yes    Education Details  exercise technique/form    Person(s) Educated  Patient    Methods  Explanation;Demonstration;Tactile cues;Verbal cues    Comprehension  Verbalized understanding       PT Short Term Goals - 04/05/19 1453      PT SHORT TERM GOAL #1   Title  Patient will report compliance with HEP for continued strengthening and stability during functional mobility.     Baseline  HEP compliant    Time  2    Period  Weeks    Status  Achieved        PT Long Term Goals - 04/05/19 1453      PT LONG TERM GOAL #1   Title  Patient will improve Dynamic Gait Index (DGI) score to > 21/24 for meaningful improvement and low falls risk regarding dynamic walking tasks (revised from > 19/24 )    Baseline  14/24; 11/02/16: 15/24 01/28/17: 18/24 6/5: 19/24; 08/23/17 14/24,  14/24 10/11/17 14/24 12/20/17, 12/27/17/  14/24 9/6 deferred due to precautions; 10/04/18: 17/24; 12/07/18: Deferred as this is a poor goals for patient given inherit limitations and scoring system; 04/05/19: deferred    Time  12    Period  Weeks    Status  Deferred      PT LONG TERM GOAL #2   Title  Patient will increase Berg Balance score by >51/56 points to be considered a low risk for falls for improved safety. (revised from >6 points improvement)    Baseline  11/02/16: 34/56 01/28/17: 39/56 6/5: 45/56; 08/23/2017 = 45/56, 43/56 10/11/17, 43/56 12/20/17, 12/27/17  43/56 6/20: 38/56 no UE support ; 8/8: 40/56 no UE support 9/6: deferred due to precautions; 9/25: 42/56; 12/08/18: 42/56 3/2: 45/56; 03/06/19: Unable to perform over telemedicine; 04/05/19: 43/56    Time  12    Period  Weeks    Status  Partially Met    Target Date  06/28/19      PT LONG TERM GOAL #3   Title  Patient will be able to transfer in and out of a large car with  a high seat with CGA  to improve ability  to go to school/doctor visits.     Baseline  Patient requires min A for transfer into large SUV unless it has a step rail on the side; 5/15: pt requires assist with a really high car but can get out of an SUV without assist, typically needs more assits getting in and getting in on the L side; 6/20: if ledge able to do with little to no assistance.  9/6 deferred due to precautions 9/25: if ledge is able to do with little or not assistance; 12/07/18: if ledge is able to do with  little or no assistance 3/2: if ledge able to perform; 03/06/19: Unchanged at this time; 04/05/19: Unchanged at this time    Time  12    Period  Weeks    Status  Partially Met    Target Date  06/28/19      PT LONG TERM GOAL #4   Title  Patient will complete a TUG test in < 12 seconds for independent mobility and decreased fall risk     Baseline  11.45; 08/23/2017 = 13.66 sec, 13.40 12/20/17, 12/27/17 12.07 sec; 5/15: 13.42 seconds 6/20: 12 seconds, 10/04/18: 13.0s; 12/07/18: 11.6 seconds; 04/05/19: 11.7s    Time  12    Period  Weeks    Status  Achieved      PT LONG TERM GOAL #5   Title  Patient will improve 6 minute walk distance by > 150 ft for improved return to functional community activities     Baseline  740 01/28/2017: 755f 6/5: 795  730 on 07/19/17; 790 ft on 08/23/18,  740 feet, 730 feet 12/20/17, 12/27/17 650 feet 6/20: 665 8/8: 701 9/6: deferred due to precautions 9/25: 6435f 10/11/18: 64087f1/23/20: 715 feet 3/2: will perform next session with new AFO's; 03/06/19:  03/06/19: Unable to perform over telemedicine; 04/05/19: 700'    Time  12    Period  Weeks    Status  Partially Met    Target Date  06/28/19      PT LONG TERM GOAL #6   Title  Patient will improve gait speed to > 1.2 m/s with least restrictive assistive device to return to normal walking speed     Baseline  .76 m/h 01/28/2017: .87  07/19/17 0.57m62m0.77 m/s on 08/23/17, . 58 m/sec 10/11/17 .48m/100m6/20: 77 m/s  8/8: .87m/s56m: terminated due to precuations for HR  9/25: 0.58m/s,79mf-selected: 16.2s = 0.62 m/s, fastest: 14.2s = 0.70 m/s; self-selected: 20.1s=0.50 m/s fastest: 13.0s=0.77 m/s; 03/06/19:  03/06/19: Unable to perform over telemedicine; 04/05/19: self-selected: 16.7s=0.60 m/s fastest: 13.7s=0.73 m/s    Time  12    Period  Weeks    Status  Partially Met   06/28/19 target date     PT LONG TERM GOAL #7   Title  Patient (< 60 year55old) will complete five times sit to stand test in < 10 seconds indicating an increased LE strength and improved balance     Baseline  9.45 sec; 9.63 on 08/23/17, 9.63 sec 12/27/17, 10.07 sec; 12/08/18: 10.2 seconds 3/2: 9 seconds no UE support; 04/05/19: 11.3s    Time  12    Period  Weeks    Status  Partially Met   06/28/19 target date     PT LONG TERM GOAL #8   Title  Patient will be able to ambulate on inclines and grass independenlty with LRAD    Baseline  Can ambulate over grassy inclines with decreased speed and safety requiring need loftstrand curtches and SBA; 5/15: pt reports she has to walk slowly and feels unsteady but that this is improving6/20 : reports cautious and slow walk, able to do mod I9/6:  deffered due to precautions9/25: able to do slowly and with caution due to fear of falling, able to do mod I, 10/25/18:  able to do slowly and with caution due to fear of falling, able to do mod I; 12/07/18: still able to perform with lofstrand crutches but remains a challenge with fear of falling 3/2: uphill more challenging than downhill, grass continues to  be a challenge; 03/06/19: Unchanged; 04/05/19: Unchanged    Time  12    Period  Weeks    Status  On-going   06/28/19 target date     PT LONG TERM GOAL  #9   TITLE  Patient will be able to transfer from low chair or stool without UE support independently    Baseline  able to perform independently when not tired, but when fatigued requires support. 10/25/18: Able to perform when legs are not fatigued, mostly limited by balance; 12/07/18: Able to perform from regular  height chair and slightly lower surface, limited by balance and better when legs are not fatigued; 3/2: limited when LE's are <90 degree angle; 03/06/19: unchanged; 04/05/19: Unchanged    Time  12    Period  Weeks    Status  On-going   06/28/19 target date     PT LONG TERM GOAL  #10   TITLE  Patient will be able to transfer from the floor to standing independently with use of LRAD    Baseline  difficult on different types of floor. Slippery floors more challenging and how long on the floor. Only need assistance when not able to put crutch against surface. 9/25: can do mod I if immediately stands back up, requires assistance if is in position for long, more difficulty with harder surfaces; 10/25/18: able to perform on soft surfaces for loftstrand traction, learned how to shorten loftrands so it can be performed on tile without falling    Time  12    Period  Weeks    Status  Achieved   03/21/18 target date     PT LONG TERM GOAL  #11   TITLE  Patient will step up onto a 6" step 5x with AD independently to increase community mobility    Baseline  6/20: challenging to patient ascending steps, supervision ; 8/8: difficult clearing L foot /14: can step up curb independently     Time  12    Period  Weeks    Status  Achieved   01/03/18 target date           Plan - 05/03/19 1524    Clinical Impression Statement  Pt demonstrates excellent motivation during session, in good spirits and willing to attempt any exercises. With fatigue pt demonstrated decreased LLE strength, decreased foot clearance, and increased hip drop during exercise repetitions. Very few rest breaks provided to improve endurance for activities. Pt ambulation without AFOs with decreased gait velocity and wider base of support but no instances of buckling/knee extension noted. Pt will benefit from PT services to address deficits in strength, balance, and mobility in order to improve and maintain full function at home and school.     Rehab Potential  Good    Clinical Impairments Affecting Rehab Potential  weakness and decreased standing balance    PT Frequency  1x / week    PT Duration  12 weeks    PT Treatment/Interventions  Therapeutic exercise;Therapeutic activities;Gait training;Balance training;Stair training;DME Instruction;Neuromuscular re-education;Patient/family education;Functional mobility training;Passive range of motion;Electrical Stimulation;Energy conservation;Orthotic Fit/Training;Manual techniques    PT Next Visit Plan  Continue strength/balance, continue working on step-ups to pratice curb negotiation when back in clinic. Remove AFOs for additional LE strengthening; 6MWT with new AFOs once pt is back in clinic    PT Home Exercise Plan  added seated dynamic core UE and LE lift offs with visual cues using mirror, supine marches, Seated program: [id number]     Consulted and  Agree with Plan of Care  Patient       Patient will benefit from skilled therapeutic intervention in order to improve the following deficits and impairments:  Abnormal gait, Decreased balance, Decreased endurance, Difficulty walking, Decreased strength, Decreased activity tolerance, Decreased coordination, Decreased mobility, Decreased range of motion, Impaired flexibility, Postural dysfunction, Improper body mechanics  Visit Diagnosis: 1. Muscle weakness (generalized)   2. Difficulty in walking, not elsewhere classified   3. Other lack of coordination   4. Weakness generalized        Problem List There are no active problems to display for this patient.  Lieutenant Diego PT, DPT 4:21 PM,05/03/19 [place] MAIN [facility] SERVICES [address] [place], [place], [address] Phone: [phone]   Fax:  [phone]  Name: Michelle Randall MRN: [id number] Date of Birth: January 14, 1996

## 2019-05-09 ENCOUNTER — Ambulatory Visit: Payer: Medicaid Other | Admitting: Physical Therapy

## 2019-05-10 ENCOUNTER — Ambulatory Visit: Payer: Medicaid Other

## 2019-05-10 ENCOUNTER — Other Ambulatory Visit: Payer: Self-pay

## 2019-05-10 DIAGNOSIS — M6281 Muscle weakness (generalized): Secondary | ICD-10-CM

## 2019-05-10 DIAGNOSIS — R262 Difficulty in walking, not elsewhere classified: Secondary | ICD-10-CM

## 2019-05-10 NOTE — Therapy (Signed)
[place] MAIN [facility] SERVICES [address] [place], [place], [address] Phone: [phone]   Fax:  [phone]  Physical Therapy Treatment  Patient Details  Name: Michelle Randall MRN: [id number] Date of Birth: September 01, 1996 No data recorded  Encounter Date: 05/10/2019  PT End of Session - 05/11/19 0838    Visit Number  165    Number of Visits  179    Date for PT Re-Evaluation  06/28/19    Authorization Type  last goals 04/05/19    Authorization Time Period  12 visits 03/27/19-06/18/19    Authorization - Visit Number  6    Authorization - Number of Visits  12    PT Start Time  1430    PT Stop Time  1525    PT Time Calculation (min)  55 min    Equipment Utilized During Treatment  Gait belt    Activity Tolerance  Patient tolerated treatment well    Behavior During Therapy  WFL for tasks assessed/performed       Past Medical History:  Diagnosis Date  . Neurogenic bladder   . Neurogenic bowel   . S/P VP shunt   . Spina bifida [facility])     Past Surgical History:  Procedure Laterality Date  . VENTRICULOPERITONEAL SHUNT      There were no vitals filed for this visit.  Subjective Assessment - 05/10/19 1543    Subjective  Patient reported that she is doing well today, no complaints, comes wearing her AFOs, no falls to report. No specific questions or concerns at this time.    Limitations  Walking;Standing;House hold activities;Other (comment)    How long can you stand comfortably?  require use of bilateral crutches    How long can you walk comfortably?  dependent upon terrain    Patient Stated Goals  Patient wants to be able to negotiate uneven terrain at school, cross thresholds in home environment, and negotiate steps in household     Currently in Pain?  No/denies          TREATMENT   Ther-ex Hooklying bridges x 10; Hooklying clams x 10; Hooklying adductor squeeze x 10; Hooklying resisted trunk rotation 3s hold x 10 each  direction; Mini squats in // bars with BUE support 2 x 10, cues to keep knees separated; Partial split squats in // bars with BUE support alternating forward LE x 10 each; Sit to stand from regular height chair with limited UE support and 4" step under single LE x 5 on each side;   Neuromuscular Re-education AFO's doffed for exercises in // bars Wide base of support (WBOS) static balance x 30s, intermittent 2 finger touch to stabilize; Forward ambulation in // bars with limited BUE support as little as needed x 2 lengths; Backward ambulation in // bars with limited BUE support as little as needed x 2 lengths; Side stepping in // bars with limited BUE support as little as needed x 2 lengths; Alternating 6" step taps with BUE support x 10 each;   Pt educated throughout session about proper posture and technique with exercises. Improved exercise technique, movement at target joints, use of target muscles after min to mod verbal, visual, tactile cues.   Pt demonstrates excellent motivation during session and is able to complete all exercises as instructed by therapist. She continues to demonstrate significant hip abduction and flexion weakness and requires UE support to maintain her balance during all dynamic tasks. Will need updated outcome measures and  goals at some point over the next few visits.She needs continued PT services to continue improving her balance and maintain her functional strength gains.Pt will benefit from PT services to address deficits in strength, balance, and mobility in order toimprove and maintainfull function at home and school.                         PT Short Term Goals - 04/05/19 1453      PT SHORT TERM GOAL #1   Title  Patient will report compliance with HEP for continued strengthening and stability during functional mobility.     Baseline  HEP compliant    Time  2    Period  Weeks    Status  Achieved        PT Long Term Goals  - 04/05/19 1453      PT LONG TERM GOAL #1   Title  Patient will improve Dynamic Gait Index (DGI) score to > 21/24 for meaningful improvement and low falls risk regarding dynamic walking tasks (revised from > 19/24 )    Baseline  14/24; 11/02/16: 15/24 01/28/17: 18/24 6/5: 19/24; 08/23/17 14/24,  14/24 10/11/17 14/24 12/20/17, 12/27/17/  14/24 9/6 deferred due to precautions; 10/04/18: 17/24; 12/07/18: Deferred as this is a poor goals for patient given inherit limitations and scoring system; 04/05/19: deferred    Time  12    Period  Weeks    Status  Deferred      PT LONG TERM GOAL #2   Title  Patient will increase Berg Balance score by >51/56 points to be considered a low risk for falls for improved safety. (revised from >6 points improvement)    Baseline  11/02/16: 34/56 01/28/17: 39/56 6/5: 45/56; 08/23/2017 = 45/56, 43/56 10/11/17, 43/56 12/20/17, 12/27/17  43/56 6/20: 38/56 no UE support ; 8/8: 40/56 no UE support 9/6: deferred due to precautions; 9/25: 42/56; 12/08/18: 42/56 3/2: 45/56; 03/06/19: Unable to perform over telemedicine; 04/05/19: 43/56    Time  12    Period  Weeks    Status  Partially Met    Target Date  06/28/19      PT LONG TERM GOAL #3   Title  Patient will be able to transfer in and out of a large car with  a high seat with CGA  to improve ability to go to school/doctor visits.     Baseline  Patient requires min A for transfer into large SUV unless it has a step rail on the side; 5/15: pt requires assist with a really high car but can get out of an SUV without assist, typically needs more assits getting in and getting in on the L side; 6/20: if ledge able to do with little to no assistance.  9/6 deferred due to precautions 9/25: if ledge is able to do with little or not assistance; 12/07/18: if ledge is able to do with little or no assistance 3/2: if ledge able to perform; 03/06/19: Unchanged at this time; 04/05/19: Unchanged at this time    Time  12    Period  Weeks    Status  Partially Met     Target Date  06/28/19      PT LONG TERM GOAL #4   Title  Patient will complete a TUG test in < 12 seconds for independent mobility and decreased fall risk     Baseline  11.45; 08/23/2017 = 13.66 sec, 13.40 12/20/17, 12/27/17 12.07 sec; 5/15: 13.42  seconds 6/20: 12 seconds, 10/04/18: 13.0s; 12/07/18: 11.6 seconds; 04/05/19: 11.7s    Time  12    Period  Weeks    Status  Achieved      PT LONG TERM GOAL #5   Title  Patient will improve 6 minute walk distance by > 150 ft for improved return to functional community activities     Baseline  740 01/28/2017: 727f 6/5: 795  730 on 07/19/17; 790 ft on 08/23/18,  740 feet, 730 feet 12/20/17, 12/27/17 650 feet 6/20: 665 8/8: 701 9/6: deferred due to precautions 9/25: 6435f 10/11/18: [id number]/23/20: 715 feet 3/2: will perform next session with new AFO's; 03/06/19:  03/06/19: Unable to perform over telemedicine; 04/05/19: 700'    Time  12    Period  Weeks    Status  Partially Met    Target Date  06/28/19      PT LONG TERM GOAL #6   Title  Patient will improve gait speed to > 1.2 m/s with least restrictive assistive device to return to normal walking speed     Baseline  .76 m/h 01/28/2017: .87  07/19/17 0.29m45m0.77 m/s on 08/23/17, . 58 m/sec 10/11/17 .62m/8m6/20: 77 m/s  8/8: .53m/s26m: terminated due to precuations for HR 9/25: 0.76m/s,83mf-selected: 16.2s = 0.62 m/s, fastest: 14.2s = 0.70 m/s; self-selected: 20.1s=0.50 m/s fastest: 13.0s=0.77 m/s; 03/06/19:  03/06/19: Unable to perform over telemedicine; 04/05/19: self-selected: 16.7s=0.60 m/s fastest: 13.7s=0.73 m/s    Time  12    Period  Weeks    Status  Partially Met   06/28/19 target date     PT LONG TERM GOAL #7   Title  Patient (< 60 year43old) will complete five times sit to stand test in < 10 seconds indicating an increased LE strength and improved balance     Baseline  9.45 sec; 9.63 on 08/23/17, 9.63 sec 12/27/17, 10.07 sec; 12/08/18: 10.2 seconds 3/2: 9 seconds no UE support; 04/05/19: 11.3s    Time  12     Period  Weeks    Status  Partially Met   06/28/19 target date     PT LONG TERM GOAL #8   Title  Patient will be able to ambulate on inclines and grass independenlty with LRAD    Baseline  Can ambulate over grassy inclines with decreased speed and safety requiring need loftstrand curtches and SBA; 5/15: pt reports she has to walk slowly and feels unsteady but that this is improving6/20 : reports cautious and slow walk, able to do mod I9/6:  deffered due to precautions9/25: able to do slowly and with caution due to fear of falling, able to do mod I, 10/25/18:  able to do slowly and with caution due to fear of falling, able to do mod I; 12/07/18: still able to perform with lofstrand crutches but remains a challenge with fear of falling 3/2: uphill more challenging than downhill, grass continues to be a challenge; 03/06/19: Unchanged; 04/05/19: Unchanged    Time  12    Period  Weeks    Status  On-going   06/28/19 target date     PT LONG TERM GOAL  #9   TITLE  Patient will be able to transfer from low chair or stool without UE support independently    Baseline  able to perform independently when not tired, but when fatigued requires support. 10/25/18: Able to perform when legs are not fatigued, mostly limited by balance; 12/07/18: Able to perform from regular  height chair and slightly lower surface, limited by balance and better when legs are not fatigued; 3/2: limited when LE's are <90 degree angle; 03/06/19: unchanged; 04/05/19: Unchanged    Time  12    Period  Weeks    Status  On-going   06/28/19 target date     PT LONG TERM GOAL  #10   TITLE  Patient will be able to transfer from the floor to standing independently with use of LRAD    Baseline  difficult on different types of floor. Slippery floors more challenging and how long on the floor. Only need assistance when not able to put crutch against surface. 9/25: can do mod I if immediately stands back up, requires assistance if is in position for  long, more difficulty with harder surfaces; 10/25/18: able to perform on soft surfaces for loftstrand traction, learned how to shorten loftrands so it can be performed on tile without falling    Time  12    Period  Weeks    Status  Achieved   03/21/18 target date     PT LONG TERM GOAL  #11   TITLE  Patient will step up onto a 6" step 5x with AD independently to increase community mobility    Baseline  6/20: challenging to patient ascending steps, supervision ; 8/8: difficult clearing L foot /14: can step up curb independently     Time  12    Period  Weeks    Status  Achieved   01/03/18 target date           Plan - 05/11/19 0840    Clinical Impression Statement  Pt demonstrates excellent motivation during session and is able to complete all exercises as instructed by therapist. She continues to demonstrate significant hip abduction and flexion weakness and requires UE support to maintain her balance during all dynamic tasks. Will need updated outcome measures and goals at some point over the next few visits. She needs continued PT services to continue improving her balance and maintain her functional strength gains. Pt will benefit from PT services to address deficits in strength, balance, and mobility in order to improve and maintain full function at home and school.    Personal Factors and Comorbidities  Comorbidity 1;Past/Current Experience;Social Background;Time since onset of injury/illness/exacerbation    Comorbidities  spina bifida    Examination-Activity Limitations  Bend;Carry;Continence;Lift;Reach Overhead;Squat;Stairs    Examination-Participation Restrictions  Church;Community Activity;Driving;Laundry;Meal Prep;Volunteer;Yard Work    Stability/Clinical Decision Making  Stable/Uncomplicated    Designer, jewellery  Low    Rehab Potential  Good    Clinical Impairments Affecting Rehab Potential  weakness and decreased standing balance    PT Frequency  1x / week    PT Duration   12 weeks    PT Treatment/Interventions  Therapeutic exercise;Therapeutic activities;Gait training;Balance training;Stair training;DME Instruction;Neuromuscular re-education;Patient/family education;Functional mobility training;Passive range of motion;Electrical Stimulation;Energy conservation;Orthotic Fit/Training;Manual techniques    PT Next Visit Plan  Continue strength/balance, continue working on step-ups to pratice curb negotiation when back in clinic. Remove AFOs for additional LE strengthening; 6MWT with new AFOs once pt is back in clinic    PT Home Exercise Plan  added seated dynamic core UE and LE lift offs with visual cues using mirror, supine marches, Seated program: [id number]     Consulted and Agree with Plan of Care  Patient       Patient will benefit from skilled therapeutic intervention in order to improve the following deficits and impairments:  Abnormal gait, Decreased balance, Decreased endurance, Difficulty walking, Decreased strength, Decreased activity tolerance, Decreased coordination, Decreased mobility, Decreased range of motion, Impaired flexibility, Postural dysfunction, Improper body mechanics  Visit Diagnosis: 1. Muscle weakness (generalized)   2. Difficulty in walking, not elsewhere classified        Problem List There are no active problems to display for this patient.  Phillips Grout PT, DPT, GCS  , 05/11/2019, 8:52 AM  [place] MAIN [facility] SERVICES [address] [place], [place], [address] Phone: [phone]   Fax:  [phone]  Name: Michelle Randall MRN: [id number] Date of Birth: 1995/12/26

## 2019-05-24 ENCOUNTER — Other Ambulatory Visit: Payer: Self-pay

## 2019-05-24 ENCOUNTER — Ambulatory Visit: Payer: Medicaid Other | Attending: Student

## 2019-05-24 DIAGNOSIS — R278 Other lack of coordination: Secondary | ICD-10-CM | POA: Diagnosis present

## 2019-05-24 DIAGNOSIS — R531 Weakness: Secondary | ICD-10-CM | POA: Insufficient documentation

## 2019-05-24 DIAGNOSIS — R262 Difficulty in walking, not elsewhere classified: Secondary | ICD-10-CM | POA: Diagnosis present

## 2019-05-24 DIAGNOSIS — M6281 Muscle weakness (generalized): Secondary | ICD-10-CM

## 2019-05-24 NOTE — Therapy (Addendum)
[place] MAIN [facility] SERVICES [address] [place], [place], [address] Phone: [phone]   Fax:  [phone]  Physical Therapy Treatment  Patient Details  Name: Michelle Randall MRN: [id number] Date of Birth: 02-17-1996 No data recorded  Encounter Date: 05/24/2019  PT End of Session - 05/24/19 1612    Visit Number  [id number]    Number of Visits  179    Date for PT Re-Evaluation  06/28/19    Authorization Type  last goals 04/05/19    Authorization Time Period  12 visits 03/27/19-06/18/19    Authorization - Visit Number  7    Authorization - Number of Visits  12    PT Start Time  9476    PT Stop Time  1600    PT Time Calculation (min)  45 min    Equipment Utilized During Treatment  Gait belt    Activity Tolerance  Patient tolerated treatment well    Behavior During Therapy  WFL for tasks assessed/performed       Past Medical History:  Diagnosis Date  . Neurogenic bladder   . Neurogenic bowel   . S/P VP shunt   . Spina bifida [facility])     Past Surgical History:  Procedure Laterality Date  . VENTRICULOPERITONEAL SHUNT      There were no vitals filed for this visit.  Subjective Assessment - 05/24/19 1611    Subjective  Patient reported that she is doing well today, no complaints, comes wearing bilateral AFOs, no falls to report. No specific questions or concerns at this time.    Limitations  Walking;Standing;House hold activities;Other (comment)    How long can you stand comfortably?  require use of bilateral crutches    How long can you walk comfortably?  dependent upon terrain    Patient Stated Goals  Patient wants to be able to negotiate uneven terrain at school, cross thresholds in home environment, and negotiate steps in household     Currently in Pain?  No/denies    Pain Score  --       TREATMENT   Ther-ex Hooklying bridges x20; Hooklying clams x20; Hooklying adductor squeeze x20; Hooklying SLR x20 each; Mini squats in //  bars with BUE support 2 x 10, cues to keep knees separated; Partialsplit squatsin // bars with BUE support alternating forward LE x 15 each;   Neuromuscular Re-education AFO's doffed for exercises in // bars Side stepping in // bars with limited BUE support as little as needed  4x2 lengths; Alternating6" step taps with BUE support x 12 each;   Pt educated throughout session about proper posture and technique with exercises. Improved exercise technique, movement at target joints, use of target muscles after min to mod verbal, visual, tactile cues.   Pt demonstrates excellent motivation during session and is able to complete all exercises as instructed by therapist. Pt. Continues to demonstrate significant LE weakness with hooklying clamshells, adductor squeeze, and bridging. She continues to demonstrate significant hip flexor weakness with step ups in // bars but it able to only use 50% UE support 50% of the time; pt. Compensates with lateral trunk lean and lower lumbar extension/excessive anterior pelvic tilt. Pt. Requires significant UE support during dynamic exercises for stability due to abductor weakness. Will need updated outcome measures and goals at some point over the next few visits.She needs continued PT services to continue improving her balance and maintain her functional strength gains.Pt will benefit from PT services to address  deficits in strength, balance, and mobility in order toimprove and maintainfull function at home and school.     PT Short Term Goals - 04/05/19 1453      PT SHORT TERM GOAL #1   Title  Patient will report compliance with HEP for continued strengthening and stability during functional mobility.     Baseline  HEP compliant    Time  2    Period  Weeks    Status  Achieved        PT Long Term Goals - 04/05/19 1453      PT LONG TERM GOAL #1   Title  Patient will improve Dynamic Gait Index (DGI) score to > 21/24 for meaningful improvement  and low falls risk regarding dynamic walking tasks (revised from > 19/24 )    Baseline  14/24; 11/02/16: 15/24 01/28/17: 18/24 6/5: 19/24; 08/23/17 14/24,  14/24 10/11/17 14/24 12/20/17, 12/27/17/  14/24 9/6 deferred due to precautions; 10/04/18: 17/24; 12/07/18: Deferred as this is a poor goals for patient given inherit limitations and scoring system; 04/05/19: deferred    Time  12    Period  Weeks    Status  Deferred      PT LONG TERM GOAL #2   Title  Patient will increase Berg Balance score by >51/56 points to be considered a low risk for falls for improved safety. (revised from >6 points improvement)    Baseline  11/02/16: 34/56 01/28/17: 39/56 6/5: 45/56; 08/23/2017 = 45/56, 43/56 10/11/17, 43/56 12/20/17, 12/27/17  43/56 6/20: 38/56 no UE support ; 8/8: 40/56 no UE support 9/6: deferred due to precautions; 9/25: 42/56; 12/08/18: 42/56 3/2: 45/56; 03/06/19: Unable to perform over telemedicine; 04/05/19: 43/56    Time  12    Period  Weeks    Status  Partially Met    Target Date  06/28/19      PT LONG TERM GOAL #3   Title  Patient will be able to transfer in and out of a large car with  a high seat with CGA  to improve ability to go to school/doctor visits.     Baseline  Patient requires min A for transfer into large SUV unless it has a step rail on the side; 5/15: pt requires assist with a really high car but can get out of an SUV without assist, typically needs more assits getting in and getting in on the L side; 6/20: if ledge able to do with little to no assistance.  9/6 deferred due to precautions 9/25: if ledge is able to do with little or not assistance; 12/07/18: if ledge is able to do with little or no assistance 3/2: if ledge able to perform; 03/06/19: Unchanged at this time; 04/05/19: Unchanged at this time    Time  12    Period  Weeks    Status  Partially Met    Target Date  06/28/19      PT LONG TERM GOAL #4   Title  Patient will complete a TUG test in < 12 seconds for independent mobility and  decreased fall risk     Baseline  11.45; 08/23/2017 = 13.66 sec, 13.40 12/20/17, 12/27/17 12.07 sec; 5/15: 13.42 seconds 6/20: 12 seconds, 10/04/18: 13.0s; 12/07/18: 11.6 seconds; 04/05/19: 11.7s    Time  12    Period  Weeks    Status  Achieved      PT LONG TERM GOAL #5   Title  Patient will improve 6 minute walk distance by >  150 ft for improved return to functional community activities     Baseline  740 01/28/2017: 74f 6/5: 795  730 on 07/19/17; 790 ft on 08/23/18,  740 feet, 730 feet 12/20/17, 12/27/17 650 feet 6/20: 665 8/8: 701 9/6: deferred due to precautions 9/25: 6414f 10/11/18: 6405f1/23/20: 715 feet 3/2: will perform next session with new AFO's; 03/06/19:  03/06/19: Unable to perform over telemedicine; 04/05/19: 700'    Time  12    Period  Weeks    Status  Partially Met    Target Date  06/28/19      PT LONG TERM GOAL #6   Title  Patient will improve gait speed to > 1.2 m/s with least restrictive assistive device to return to normal walking speed     Baseline  .76 m/h 01/28/2017: .87  07/19/17 0.76m30m0.77 m/s on 08/23/17, . 58 m/sec 10/11/17 .73m/63m6/20: 77 m/s  8/8: .85m/s52m: terminated due to precuations for HR 9/25: 0.55m/s,35mf-selected: 16.2s = 0.62 m/s, fastest: 14.2s = 0.70 m/s; self-selected: 20.1s=0.50 m/s fastest: 13.0s=0.77 m/s; 03/06/19:  03/06/19: Unable to perform over telemedicine; 04/05/19: self-selected: 16.7s=0.60 m/s fastest: 13.7s=0.73 m/s    Time  12    Period  Weeks    Status  Partially Met   06/28/19 target date     PT LONG TERM GOAL #7   Title  Patient (< 60 year74old) will complete five times sit to stand test in < 10 seconds indicating an increased LE strength and improved balance     Baseline  9.45 sec; 9.63 on 08/23/17, 9.63 sec 12/27/17, 10.07 sec; 12/08/18: 10.2 seconds 3/2: 9 seconds no UE support; 04/05/19: 11.3s    Time  12    Period  Weeks    Status  Partially Met   06/28/19 target date     PT LONG TERM GOAL #8   Title  Patient will be able to ambulate on  inclines and grass independenlty with LRAD    Baseline  Can ambulate over grassy inclines with decreased speed and safety requiring need loftstrand curtches and SBA; 5/15: pt reports she has to walk slowly and feels unsteady but that this is improving6/20 : reports cautious and slow walk, able to do mod I9/6:  deffered due to precautions9/25: able to do slowly and with caution due to fear of falling, able to do mod I, 10/25/18:  able to do slowly and with caution due to fear of falling, able to do mod I; 12/07/18: still able to perform with lofstrand crutches but remains a challenge with fear of falling 3/2: uphill more challenging than downhill, grass continues to be a challenge; 03/06/19: Unchanged; 04/05/19: Unchanged    Time  12    Period  Weeks    Status  On-going   06/28/19 target date     PT LONG TERM GOAL  #9   TITLE  Patient will be able to transfer from low chair or stool without UE support independently    Baseline  able to perform independently when not tired, but when fatigued requires support. 10/25/18: Able to perform when legs are not fatigued, mostly limited by balance; 12/07/18: Able to perform from regular height chair and slightly lower surface, limited by balance and better when legs are not fatigued; 3/2: limited when LE's are <90 degree angle; 03/06/19: unchanged; 04/05/19: Unchanged    Time  12    Period  Weeks    Status  On-going   06/28/19 target date  PT LONG TERM GOAL  #10   TITLE  Patient will be able to transfer from the floor to standing independently with use of LRAD    Baseline  difficult on different types of floor. Slippery floors more challenging and how long on the floor. Only need assistance when not able to put crutch against surface. 9/25: can do mod I if immediately stands back up, requires assistance if is in position for long, more difficulty with harder surfaces; 10/25/18: able to perform on soft surfaces for loftstrand traction, learned how to shorten  loftrands so it can be performed on tile without falling    Time  12    Period  Weeks    Status  Achieved   03/21/18 target date     PT LONG TERM GOAL  #11   TITLE  Patient will step up onto a 6" step 5x with AD independently to increase community mobility    Baseline  6/20: challenging to patient ascending steps, supervision ; 8/8: difficult clearing L foot /14: can step up curb independently     Time  12    Period  Weeks    Status  Achieved   01/03/18 target date           Plan - 05/24/19 1636    Clinical Impression Statement  Pt demonstrates excellent motivation during session and is able to complete all exercises as instructed by therapist. Pt. Continues to demonstrate significant LE weakness with hooklying clamshells, adductor squeeze, and bridging. She continues to demonstrate significant hip flexor weakness with step ups in // bars but it able to only use 50% UE support 50% of the time; pt. Compensates with lateral trunk lean and lower lumbar extension/excessive anterior pelvic tilt. Pt. Requires significant UE support during dynamic exercises for stability due to abductor weakness. Will need updated outcome measures and goals at some point over the next few visits. She needs continued PT services to continue improving her balance and maintain her functional strength gains. Pt will benefit from PT services to address deficits in strength, balance, and mobility in order to improve and maintain full function at home and school.    Personal Factors and Comorbidities  Comorbidity 1;Past/Current Experience;Social Background;Time since onset of injury/illness/exacerbation    Comorbidities  spina bifida    Examination-Activity Limitations  Bend;Carry;Continence;Lift;Reach Overhead;Squat;Stairs    Examination-Participation Restrictions  Church;Community Activity;Driving;Laundry;Meal Prep;Volunteer;Yard Work    Stability/Clinical Decision Making  Stable/Uncomplicated    Rehab Potential  Good     Clinical Impairments Affecting Rehab Potential  weakness and decreased standing balance    PT Frequency  1x / week    PT Duration  12 weeks    PT Treatment/Interventions  Therapeutic exercise;Therapeutic activities;Gait training;Balance training;Stair training;DME Instruction;Neuromuscular re-education;Patient/family education;Functional mobility training;Passive range of motion;Electrical Stimulation;Energy conservation;Orthotic Fit/Training;Manual techniques    PT Next Visit Plan  Continue strength/balance, continue working on step-ups to pratice curb negotiation when back in clinic. Remove AFOs for additional LE strengthening; 6MWT with new AFOs once pt is back in clinic    PT Home Exercise Plan  added seated dynamic core UE and LE lift offs with visual cues using mirror, supine marches, Seated program: [id number]     Consulted and Agree with Plan of Care  Patient       Patient will benefit from skilled therapeutic intervention in order to improve the following deficits and impairments:  Abnormal gait, Decreased balance, Decreased endurance, Difficulty walking, Decreased strength, Decreased activity tolerance, Decreased coordination, Decreased mobility,  Decreased range of motion, Impaired flexibility, Postural dysfunction, Improper body mechanics  Visit Diagnosis: 1. Muscle weakness (generalized)   2. Difficulty in walking, not elsewhere classified        Problem List There are no active problems to display for this patient.   This entire session was performed under direct supervision and direction of a licensed therapist/therapist assistant . I have personally read, edited and approve of the note as written.   Elmyra Ricks Bolivar Koranda SPT Phillips Grout PT, DPT, GCS  Huprich,Jason 05/25/2019, 10:23 AM  [place] MAIN [facility] SERVICES [address] [place], [place], [address] Phone: [phone]   Fax:  [phone]  Name: Michelle Randall MRN:  [id number] Date of Birth: October 10, 1996

## 2019-05-30 ENCOUNTER — Ambulatory Visit: Payer: Medicaid Other

## 2019-05-30 ENCOUNTER — Ambulatory Visit: Payer: Medicaid Other | Admitting: Physical Therapy

## 2019-05-30 ENCOUNTER — Other Ambulatory Visit: Payer: Self-pay

## 2019-05-30 DIAGNOSIS — R278 Other lack of coordination: Secondary | ICD-10-CM

## 2019-05-30 DIAGNOSIS — M6281 Muscle weakness (generalized): Secondary | ICD-10-CM

## 2019-05-30 DIAGNOSIS — R262 Difficulty in walking, not elsewhere classified: Secondary | ICD-10-CM

## 2019-05-30 DIAGNOSIS — R531 Weakness: Secondary | ICD-10-CM

## 2019-05-30 NOTE — Therapy (Addendum)
[place] MAIN [facility] SERVICES [address] [place], [place], [address] Phone: [phone]   Fax:  [phone]  Physical Therapy Treatment  Patient Details  Name: Michelle Randall MRN: [id number] Date of Birth: Mar 22, 1996 No data recorded  Encounter Date: 05/30/2019  PT End of Session - 05/30/19 1252    Visit Number  [id number]    Number of Visits  179    Date for PT Re-Evaluation  06/28/19    Authorization Type  last goals 04/05/19    Authorization Time Period  12 visits 03/27/19-06/18/19    Authorization - Visit Number  8    Authorization - Number of Visits  12    PT Start Time  1108    PT Stop Time  1158    PT Time Calculation (min)  50 min    Equipment Utilized During Treatment  Gait belt    Activity Tolerance  Patient tolerated treatment well    Behavior During Therapy  WFL for tasks assessed/performed       Past Medical History:  Diagnosis Date  . Neurogenic bladder   . Neurogenic bowel   . S/P VP shunt   . Spina bifida [facility])     Past Surgical History:  Procedure Laterality Date  . VENTRICULOPERITONEAL SHUNT      There were no vitals filed for this visit.  Subjective Assessment - 05/30/19 1250    Subjective  Pt. reports doing well with no recent falls. No current concerns or complaints. Pt. remains compliant with HEP.    Limitations  Walking;Standing;House hold activities;Other (comment)    How long can you stand comfortably?  require use of bilateral crutches    How long can you walk comfortably?  dependent upon terrain    Patient Stated Goals  Patient wants to be able to negotiate uneven terrain at school, cross thresholds in home environment, and negotiate steps in household     Currently in Pain?  No/denies         TREATMENT CGA for all exercises, however patient is safe with all exercises   Ther-ex  Hooklying clams x10; Hooklying adductor squeeze x10; Hooklying SLR x10 each; Supine SAQ with bolster under knees x10  each;     Neuromuscular Re-education  AFO's doffed for exercises in // bars Side stepping in // bars with limited BUE support as little as needed 4x2 lengths; Forward/backward ambulation in // bars with limited BUE support x2 lengths each; Wide BOS static balance x30s;  Wide BOS balance with horizontal and vertical head turns 2x30s each Wide BOS stance with M/L weight shifting x60sec Staggered stance forward/backward weight shifting alt lead LE x60 each Alternating lead LE 6" step-ups with BUE support x6 each  Hedge hog taps with BUE support x5 each LE, verbal cues to shift weight through disc; 4 consecutive taps in a straight horizontal line to increase single leg stance time  Gait with obstacles: thera block step up, step over 1-hurdle, and turn around a disc with max UE support x6 lengths with BUE support and CGA     Pt educated throughout session about proper posture and technique with exercises. Improved exercise technique, movement at target joints, use of target muscles after min to mod verbal, visual, tactile cues.      Pt demonstrates excellent motivation during session and is able to complete all exercises as instructed by therapist. Pt continues to demonstrate significant LE weakness with hooklying clamshells, and adductor squeeze. Pt demonstrates significant hip  flexor weakness during step ups requiring max BUE support and increased difficulty with step over hurdle during gait with obstacles. She tends to perform hip flexion well with the lead LE to step over the hurdle but demonstrates difficulty to get the other LE over hurdle by hip circumduction. Pt compensates with lateral trunk lean and excessive anterior pelvic tilt with step ups and side stepping. She works hard and is willing to attempt new exercises with motivation. Will need updated outcome measures and goals at some point over the next few visits. She needs continued PT services to continue improving her balance and  maintain her functional strength gains. Pt will benefit from PT services to address deficits in strength, balance, and mobility in order to improve and maintain full function at home and school.       PT Education - 05/30/19 1251    Education provided  Yes    Education Details  Cues for technique and form throughout therex    Person(s) Educated  Patient    Methods  Explanation;Demonstration;Verbal cues    Comprehension  Returned demonstration;Verbalized understanding;Verbal cues required       PT Short Term Goals - 04/05/19 1453      PT SHORT TERM GOAL #1   Title  Patient will report compliance with HEP for continued strengthening and stability during functional mobility.     Baseline  HEP compliant    Time  2    Period  Weeks    Status  Achieved        PT Long Term Goals - 04/05/19 1453      PT LONG TERM GOAL #1   Title  Patient will improve Dynamic Gait Index (DGI) score to > 21/24 for meaningful improvement and low falls risk regarding dynamic walking tasks (revised from > 19/24 )    Baseline  14/24; 11/02/16: 15/24 01/28/17: 18/24 6/5: 19/24; 08/23/17 14/24,  14/24 10/11/17 14/24 12/20/17, 12/27/17/  14/24 9/6 deferred due to precautions; 10/04/18: 17/24; 12/07/18: Deferred as this is a poor goals for patient given inherit limitations and scoring system; 04/05/19: deferred    Time  12    Period  Weeks    Status  Deferred      PT LONG TERM GOAL #2   Title  Patient will increase Berg Balance score by >51/56 points to be considered a low risk for falls for improved safety. (revised from >6 points improvement)    Baseline  11/02/16: 34/56 01/28/17: 39/56 6/5: 45/56; 08/23/2017 = 45/56, 43/56 10/11/17, 43/56 12/20/17, 12/27/17  43/56 6/20: 38/56 no UE support ; 8/8: 40/56 no UE support 9/6: deferred due to precautions; 9/25: 42/56; 12/08/18: 42/56 3/2: 45/56; 03/06/19: Unable to perform over telemedicine; 04/05/19: 43/56    Time  12    Period  Weeks    Status  Partially Met    Target Date   06/28/19      PT LONG TERM GOAL #3   Title  Patient will be able to transfer in and out of a large car with  a high seat with CGA  to improve ability to go to school/doctor visits.     Baseline  Patient requires min A for transfer into large SUV unless it has a step rail on the side; 5/15: pt requires assist with a really high car but can get out of an SUV without assist, typically needs more assits getting in and getting in on the L side; 6/20: if ledge able to do with  little to no assistance.  9/6 deferred due to precautions 9/25: if ledge is able to do with little or not assistance; 12/07/18: if ledge is able to do with little or no assistance 3/2: if ledge able to perform; 03/06/19: Unchanged at this time; 04/05/19: Unchanged at this time    Time  12    Period  Weeks    Status  Partially Met    Target Date  06/28/19      PT LONG TERM GOAL #4   Title  Patient will complete a TUG test in < 12 seconds for independent mobility and decreased fall risk     Baseline  11.45; 08/23/2017 = 13.66 sec, 13.40 12/20/17, 12/27/17 12.07 sec; 5/15: 13.42 seconds 6/20: 12 seconds, 10/04/18: 13.0s; 12/07/18: 11.6 seconds; 04/05/19: 11.7s    Time  12    Period  Weeks    Status  Achieved      PT LONG TERM GOAL #5   Title  Patient will improve 6 minute walk distance by > 150 ft for improved return to functional community activities     Baseline  740 01/28/2017: 773f 6/5: 795  730 on 07/19/17; 790 ft on 08/23/18,  740 feet, 730 feet 12/20/17, 12/27/17 650 feet 6/20: 665 8/8: 701 9/6: deferred due to precautions 9/25: 6417f 10/11/18: [id number]/23/20: 715 feet 3/2: will perform next session with new AFO's; 03/06/19:  03/06/19: Unable to perform over telemedicine; 04/05/19: 700'    Time  12    Period  Weeks    Status  Partially Met    Target Date  06/28/19      PT LONG TERM GOAL #6   Title  Patient will improve gait speed to > 1.2 m/s with least restrictive assistive device to return to normal walking speed     Baseline  .76 m/h  01/28/2017: .87  07/19/17 0.80m15m0.77 m/s on 08/23/17, . 58 m/sec 10/11/17 .90m/22m6/20: 77 m/s  8/8: .60m/s63m: terminated due to precuations for HR 9/25: 0.53m/s,71mf-selected: 16.2s = 0.62 m/s, fastest: 14.2s = 0.70 m/s; self-selected: 20.1s=0.50 m/s fastest: 13.0s=0.77 m/s; 03/06/19:  03/06/19: Unable to perform over telemedicine; 04/05/19: self-selected: 16.7s=0.60 m/s fastest: 13.7s=0.73 m/s    Time  12    Period  Weeks    Status  Partially Met   06/28/19 target date     PT LONG TERM GOAL #7   Title  Patient (< 60 year33old) will complete five times sit to stand test in < 10 seconds indicating an increased LE strength and improved balance     Baseline  9.45 sec; 9.63 on 08/23/17, 9.63 sec 12/27/17, 10.07 sec; 12/08/18: 10.2 seconds 3/2: 9 seconds no UE support; 04/05/19: 11.3s    Time  12    Period  Weeks    Status  Partially Met   06/28/19 target date     PT LONG TERM GOAL #8   Title  Patient will be able to ambulate on inclines and grass independenlty with LRAD    Baseline  Can ambulate over grassy inclines with decreased speed and safety requiring need loftstrand curtches and SBA; 5/15: pt reports she has to walk slowly and feels unsteady but that this is improving6/20 : reports cautious and slow walk, able to do mod I9/6:  deffered due to precautions9/25: able to do slowly and with caution due to fear of falling, able to do mod I, 10/25/18:  able to do slowly and with caution due to fear of falling,  able to do mod I; 12/07/18: still able to perform with lofstrand crutches but remains a challenge with fear of falling 3/2: uphill more challenging than downhill, grass continues to be a challenge; 03/06/19: Unchanged; 04/05/19: Unchanged    Time  12    Period  Weeks    Status  On-going   06/28/19 target date     PT LONG TERM GOAL  #9   TITLE  Patient will be able to transfer from low chair or stool without UE support independently    Baseline  able to perform independently when not tired, but when  fatigued requires support. 10/25/18: Able to perform when legs are not fatigued, mostly limited by balance; 12/07/18: Able to perform from regular height chair and slightly lower surface, limited by balance and better when legs are not fatigued; 3/2: limited when LE's are <90 degree angle; 03/06/19: unchanged; 04/05/19: Unchanged    Time  12    Period  Weeks    Status  On-going   06/28/19 target date     PT LONG TERM GOAL  #10   TITLE  Patient will be able to transfer from the floor to standing independently with use of LRAD    Baseline  difficult on different types of floor. Slippery floors more challenging and how long on the floor. Only need assistance when not able to put crutch against surface. 9/25: can do mod I if immediately stands back up, requires assistance if is in position for long, more difficulty with harder surfaces; 10/25/18: able to perform on soft surfaces for loftstrand traction, learned how to shorten loftrands so it can be performed on tile without falling    Time  12    Period  Weeks    Status  Achieved   03/21/18 target date     PT LONG TERM GOAL  #11   TITLE  Patient will step up onto a 6" step 5x with AD independently to increase community mobility    Baseline  6/20: challenging to patient ascending steps, supervision ; 8/8: difficult clearing L foot /14: can step up curb independently     Time  12    Period  Weeks    Status  Achieved   01/03/18 target date           Plan - 05/30/19 1409    Clinical Impression Statement  Pt demonstrates excellent motivation during session and is able to complete all exercises as instructed by therapist. Pt continues to demonstrate significant LE weakness with hooklying clamshells, and adductor squeeze. Pt demonstrates significant hip flexor weakness during step ups requiring max BUE support and increased difficulty with step over hurdle during gait with obstacles. She tends to perform hip flexion well with the lead LE to step over the  hurdle but demonstrates difficulty to get the other LE over hurdle by hip circumduction. Pt compensates with lateral trunk lean and excessive anterior pelvic tilt with step ups and side stepping. She works hard and is willing to attempt new exercises with motivation. Will need updated outcome measures and goals at some point over the next few visits. She needs continued PT services to continue improving her balance and maintain her functional strength gains. Pt will benefit from PT services to address deficits in strength, balance, and mobility in order to improve and maintain full function at home and school.    Personal Factors and Comorbidities  Comorbidity 1;Past/Current Experience;Social Background;Time since onset of injury/illness/exacerbation    Comorbidities  spina  bifida    Examination-Activity Limitations  Bend;Carry;Continence;Lift;Reach Overhead;Squat;Stairs    Examination-Participation Restrictions  Church;Community Activity;Driving;Laundry;Meal Prep;Volunteer;Yard Work    Stability/Clinical Decision Making  Stable/Uncomplicated    Rehab Potential  Good    Clinical Impairments Affecting Rehab Potential  weakness and decreased standing balance    PT Frequency  1x / week    PT Duration  12 weeks    PT Treatment/Interventions  Therapeutic exercise;Therapeutic activities;Gait training;Balance training;Stair training;DME Instruction;Neuromuscular re-education;Patient/family education;Functional mobility training;Passive range of motion;Electrical Stimulation;Energy conservation;Orthotic Fit/Training;Manual techniques    PT Next Visit Plan  Update outcome measures. Continue strength/balance, continue working on step-ups to pratice curb negotiation when back in clinic. Remove AFOs for additional LE strengthening;    PT Home Exercise Plan  added seated dynamic core UE and LE lift offs with visual cues using mirror, supine marches, Seated program: [id number]     Consulted and Agree with Plan of Care   Patient       Patient will benefit from skilled therapeutic intervention in order to improve the following deficits and impairments:  Abnormal gait, Decreased balance, Decreased endurance, Difficulty walking, Decreased strength, Decreased activity tolerance, Decreased coordination, Decreased mobility, Decreased range of motion, Impaired flexibility, Postural dysfunction, Improper body mechanics  Visit Diagnosis: 1. Muscle weakness (generalized)   2. Difficulty in walking, not elsewhere classified   3. Other lack of coordination   4. Weakness generalized        Problem List There are no active problems to display for this patient.   This entire session was performed under direct supervision and direction of a licensed therapist/therapist assistant . I have personally read, edited and approve of the note as written.    Elmyra Ricks Edker Punt SPT Phillips Grout PT, DPT, GCS  Huprich,Jason 05/30/2019, 4:33 PM  [place] MAIN [facility] SERVICES [address] [place], [place], [address] Phone: [phone]   Fax:  [phone]  Name: Michelle Randall MRN: [id number] Date of Birth: 1996/04/23

## 2019-06-07 ENCOUNTER — Ambulatory Visit: Payer: Medicaid Other

## 2019-06-07 ENCOUNTER — Other Ambulatory Visit: Payer: Self-pay

## 2019-06-07 DIAGNOSIS — R531 Weakness: Secondary | ICD-10-CM

## 2019-06-07 DIAGNOSIS — M6281 Muscle weakness (generalized): Secondary | ICD-10-CM

## 2019-06-07 DIAGNOSIS — R278 Other lack of coordination: Secondary | ICD-10-CM

## 2019-06-07 DIAGNOSIS — R262 Difficulty in walking, not elsewhere classified: Secondary | ICD-10-CM

## 2019-06-07 NOTE — Therapy (Signed)
[place] MA[facility] SERVICES [address] [place], [place], [address] Phone: [phone]   Fax:  [phone]  Physical Therapy Treatment/Goal Update  Patient Details  Name: Michelle Randall MRN: [id number] Date of Birth: 04-29-1996 No data recorded  Encounter Date: 06/07/2019  PT End of Session - 06/07/19 1612    Visit Number  [id number]    Number of Visits  179    Date for PT Re-Evaluation  06/28/19    Authorization Type  last goals 06/07/19    Authorization Time Period  12 visits 03/27/19-06/18/19    Authorization - Visit Number  9    Authorization - Number of Visits  12    PT Start Time  8502    PT Stop Time  1600    PT Time Calculation (min)  45 min    Equipment Utilized During Treatment  Gait belt    Activity Tolerance  Patient tolerated treatment well    Behavior During Therapy  WFL for tasks assessed/performed       Past Medical History:  Diagnosis Date  . Neurogenic bladder   . Neurogenic bowel   . S/P VP shunt   . Spina bifida [facility])     Past Surgical History:  Procedure Laterality Date  . VENTRICULOPERITONEAL SHUNT      There were no vitals filed for this visit.  Subjective Assessment - 06/07/19 1516    Subjective  Pt reports doing well today with no concerns or questions at this time.    Limitations  Walking;Standing;House hold activities;Other (comment)    How long can you stand comfortably?  require use of bilateral crutches    How long can you walk comfortably?  dependent upon terrain    Patient Stated Goals  Patient wants to be able to negotiate uneven terrain at school, cross thresholds in home environment, and negotiate steps in household     Currently in Pain?  No/denies        TREATMENT CGA for all activities   Performed the following outcome measures:  5xSTS: 11.53s with loftstrand crutches, 12.48s without loftsrand crutches  BERG: 42/56, during turning task pt loses balance places feet on floor, assist by  therapist to get back to upright standing; 6MWT: deferred  TUG: 15.69s 10MWT for gait speed: self-selected 22.34s = 0.38ms; fastest 16.31s = 0.636m HR elevated at 120bpm following.    Pt educated throughout session about proper posture and technique withoutcome measures. Improved exercise technique, movement at target joints, use of target muscles after min to mod verbal, visual, tactile cues.Fatigue continuously monitored throughout. CGA provided for all balance outcome measures for safety;   Performed outcome measures and updated goals with patient today. She demonstrated unchanged progress at this time in regards to outcome measures but does not require intermittent rest breaks between each task. All outcome measures were performed with bilateral donned AFOs, mobility tasks with bilateral loftstrand crutches, and balance tasks performed in parallel bars for safety. 6MWT is deferred until next session. Will continue to work on dynamic balance in the parallel bars and work toward her goal of added safety while navigating curbs.She needs continued PT services to continue improving her balance and maintain her functional strength gains.Pt will benefit from PT services to address deficits in strength, balance, and mobility in order toimprove and maintainfull function at home and school.      PT Short Term Goals - 04/05/19 1453      PT SHORT TERM GOAL #1  Title  Patient will report compliance with HEP for continued strengthening and stability during functional mobility.     Baseline  HEP compliant    Time  2    Period  Weeks    Status  Achieved        PT Long Term Goals - 06/07/19 1604      PT LONG TERM GOAL #1   Title  Patient will be able to transfer from the floor to standing independently with use of LRAD    Baseline  difficult on different types of floor. Slippery floors more challenging and how long on the floor. Only need assistance when not able to put crutch against  surface. 9/25: can do mod I if immediately stands back up, requires assistance if is in position for long, more difficulty with harder surfaces; 10/25/18: able to perform on soft surfaces for loftstrand traction, learned how to shorten loftrands so it can be performed on tile without falling    Time  12    Period  Weeks    Status  Achieved      PT LONG TERM GOAL #2   Title  Patient will increase Berg Balance score by >51/56 points to be considered a low risk for falls for improved safety. (revised from >6 points improvement)    Baseline  11/02/16: 34/56 01/28/17: 39/56 6/5: 45/56; 08/23/2017 = 45/56, 43/56 10/11/17, 43/56 12/20/17, 12/27/17  43/56 6/20: 38/56 no UE support ; 8/8: 40/56 no UE support 9/6: deferred due to precautions; 9/25: 42/56; 12/08/18: 42/56 3/2: 45/56; 03/06/19: Unable to perform over telemedicine; 04/05/19: 43/56; 06/07/19: 42/56    Time  12    Period  Weeks    Status  Partially Met    Target Date  06/28/19      PT LONG TERM GOAL #3   Title  Patient will be able to transfer in and out of a large car with  a high seat with CGA  to improve ability to go to school/doctor visits.     Baseline  Patient requires min A for transfer into large SUV unless it has a step rail on the side; 5/15: pt requires assist with a really high car but can get out of an SUV without assist, typically needs more assits getting in and getting in on the L side; 6/20: if ledge able to do with little to no assistance.  9/6 deferred due to precautions 9/25: if ledge is able to do with little or not assistance; 12/07/18: if ledge is able to do with little or no assistance 3/2: if ledge able to perform; 03/06/19: Unchanged at this time; 04/05/19: Unchanged at this time; 06/07/19: very difficult at this time    Time  12    Period  Weeks    Status  Partially Met    Target Date  06/28/19      PT LONG TERM GOAL #4   Title  Patient will complete a TUG test in < 12 seconds for independent mobility and decreased fall risk      Baseline  11.45; 08/23/2017 = 13.66 sec, 13.40 12/20/17, 12/27/17 12.07 sec; 5/15: 13.42 seconds 6/20: 12 seconds, 10/04/18: 13.0s; 12/07/18: 11.6 seconds; 04/05/19: 11.7s; 06/07/19: 15.69s    Time  12    Period  Weeks    Status  Partially Met    Target Date  06/28/19      PT LONG TERM GOAL #5   Title  Patient will improve 6 minute walk distance by >  150 ft for improved return to functional community activities     Baseline  740 01/28/2017: 768f 6/5: 795  730 on 07/19/17; 790 ft on 08/23/18,  740 feet, 730 feet 12/20/17, 12/27/17 650 feet 6/20: 665 8/8: 701 9/6: deferred due to precautions 9/25: 6457f 10/11/18: 64037f1/23/20: 715 feet 3/2: will perform next session with new AFO's; 03/06/19:  03/06/19: Unable to perform over telemedicine; 04/05/19: 700'    Time  12    Period  Weeks    Status  Deferred      PT LONG TERM GOAL #6   Title  Patient will improve gait speed to > 1.2 m/s with least restrictive assistive device to return to normal walking speed     Baseline  .76 m/h 01/28/2017: .87  07/19/17 0.63m20m0.77 m/s on 08/23/17, . 58 m/sec 10/11/17 .52m/30m6/20: 77 m/s  8/8: .4m/s18m: terminated due to precuations for HR 9/25: 0.15m/s,82mf-selected: 16.2s = 0.62 m/s, fastest: 14.2s = 0.70 m/s; self-selected: 20.1s=0.50 m/s fastest: 13.0s=0.77 m/s; 03/06/19:  03/06/19: Unable to perform over telemedicine; 04/05/19: self-selected: 16.7s=0.60 m/s fastest: 13.7s=0.73 m/s; 06/07/19:  self-selected 22.34s = 0.64m/s; 27mest 16.31s = 0.68m/s   14me  12    Period  Weeks    Status  Partially Met   06/28/19 target date     PT LONG TERM GOAL #7   Title  Patient (< 60 years 33d) will complete five times sit to stand test in < 10 seconds indicating an increased LE strength and improved balance     Baseline  9.45 sec; 9.63 on 08/23/17, 9.63 sec 12/27/17, 10.07 sec; 12/08/18: 10.2 seconds 3/2: 9 seconds no UE support; 04/05/19: 11.3s; 06/07/19:12.48s    Time  12    Period  Weeks    Status  Partially Met   06/28/19 target  date     PT LONG TERM GOAL #8   Title  Patient will be able to ambulate on inclines and grass independenlty with LRAD    Baseline  Can ambulate over grassy inclines with decreased speed and safety requiring need loftstrand curtches and SBA; 5/15: pt reports she has to walk slowly and feels unsteady but that this is improving6/20 : reports cautious and slow walk, able to do mod I9/6:  deffered due to precautions9/25: able to do slowly and with caution due to fear of falling, able to do mod I, 10/25/18:  able to do slowly and with caution due to fear of falling, able to do mod I; 12/07/18: still able to perform with lofstrand crutches but remains a challenge with fear of falling 3/2: uphill more challenging than downhill, grass continues to be a challenge; 03/06/19: Unchanged; 04/05/19: Unchanged; 06/07/19: unchanged    Time  12    Period  Weeks    Status  On-going   06/28/19 target date     PT LONG TERM GOAL  #9   TITLE  Patient will be able to transfer from low chair or stool without UE support independently    Baseline  able to perform independently when not tired, but when fatigued requires support. 10/25/18: Able to perform when legs are not fatigued, mostly limited by balance; 12/07/18: Able to perform from regular height chair and slightly lower surface, limited by balance and better when legs are not fatigued; 3/2: limited when LE's are <90 degree angle; 03/06/19: unchanged; 04/05/19: Unchanged; 06/07/19: unchanged    Time  12    Period  Weeks    Status  On-going   06/28/19 target date     PT LONG TERM GOAL  #10   TITLE  --    Baseline  --    Time  --    Period  --    Status  --   03/21/18 target date     PT LONG TERM GOAL  #11   TITLE  --    Baseline  --    Time  --    Period  --    Status  --   01/03/18 target date           Plan - 06/07/19 1516    Clinical Impression Statement  Performed outcome measures and updated goals with patient today. She demonstrated unchanged progress  at this time in regards to outcome measures but does not require intermittent rest breaks between each task. All outcome measures were performed with bilateral donned AFOs, mobility tasks with bilateral loftstrand crutches, and balance tasks performed in parallel bars for safety. 6MWT is deferred until next session. Will continue to work on dynamic balance in the parallel bars and work toward her goal of added safety while navigating curbs. She needs continued PT services to continue improving her balance and maintain her functional strength gains. Pt will benefit from PT services to address deficits in strength, balance, and mobility in order to improve and maintain full function at home and school.    Personal Factors and Comorbidities  Comorbidity 1;Past/Current Experience;Social Background;Time since onset of injury/illness/exacerbation    Comorbidities  spina bifida    Examination-Activity Limitations  Bend;Carry;Continence;Lift;Reach Overhead;Squat;Stairs    Examination-Participation Restrictions  Church;Community Activity;Driving;Laundry;Meal Prep;Volunteer;Yard Work    Stability/Clinical Decision Making  Stable/Uncomplicated    Rehab Potential  Good    Clinical Impairments Affecting Rehab Potential  weakness and decreased standing balance    PT Frequency  1x / week    PT Duration  12 weeks    PT Treatment/Interventions  Therapeutic exercise;Therapeutic activities;Gait training;Balance training;Stair training;DME Instruction;Neuromuscular re-education;Patient/family education;Functional mobility training;Passive range of motion;Electrical Stimulation;Energy conservation;Orthotic Fit/Training;Manual techniques    PT Next Visit Plan  Update outcome measures. Continue strength/balance, continue working on step-ups to pratice curb negotiation when back in clinic. Remove AFOs for additional LE strengthening;    PT Home Exercise Plan  added seated dynamic core UE and LE lift offs with visual cues using  mirror, supine marches, Seated program: [id number]     Consulted and Agree with Plan of Care  Patient       Patient will benefit from skilled therapeutic intervention in order to improve the following deficits and impairments:  Abnormal gait, Decreased balance, Decreased endurance, Difficulty walking, Decreased strength, Decreased activity tolerance, Decreased coordination, Decreased mobility, Decreased range of motion, Impaired flexibility, Postural dysfunction, Improper body mechanics  Visit Diagnosis: 1. Muscle weakness (generalized)   2. Difficulty in walking, not elsewhere classified   3. Other lack of coordination   4. Weakness generalized        Problem List There are no active problems to display for this patient.  This entire session was performed under direct supervision and direction of a licensed therapist/therapist assistant . I have personally read, edited and approve of the note as written.   Elmyra Ricks Zillah Alexie SPT Phillips Grout PT, DPT, GCS  Huprich,Jason 06/08/2019, 1:14 PM  [place] MAIN [facility] SERVICES [address] [place], [place], [address] Phone: [phone]   Fax:  [phone]  Name: JAZIAH KWASNIK MRN: [id number] Date of  Birth: August 12, 1996

## 2019-06-13 ENCOUNTER — Other Ambulatory Visit: Payer: Self-pay

## 2019-06-13 ENCOUNTER — Encounter: Payer: Self-pay | Admitting: Physical Therapy

## 2019-06-13 ENCOUNTER — Ambulatory Visit: Payer: Medicaid Other | Admitting: Physical Therapy

## 2019-06-13 DIAGNOSIS — M6281 Muscle weakness (generalized): Secondary | ICD-10-CM | POA: Diagnosis not present

## 2019-06-13 DIAGNOSIS — R262 Difficulty in walking, not elsewhere classified: Secondary | ICD-10-CM

## 2019-06-13 DIAGNOSIS — R278 Other lack of coordination: Secondary | ICD-10-CM

## 2019-06-13 NOTE — Therapy (Signed)
[facility] [address] [place], [place], [address] Phone: [phone]   Fax:  [phone]  Physical Therapy Treatment  Patient Details  Name: Michelle Randall MRN: [id number] Date of Birth: [birth year] No data recorded  Encounter Date: 06/13/2019  PT End of Session - 06/13/19 1315    Visit Number  169    Number of Visits  179    Date for PT Re-Evaluation  06/28/19    Authorization Type  last goals 06/07/19    Authorization Time Period  12 visits 03/27/19-06/18/19    Authorization - Visit Number  10    Authorization - Number of Visits  12    PT Start Time  1100    PT Stop Time  1145    PT Time Calculation (min)  45 min    Equipment Utilized During Treatment  Gait belt    Activity Tolerance  Patient tolerated treatment well    Behavior During Therapy  WFL for tasks assessed/performed       Past Medical History:  Diagnosis Date  . Neurogenic bladder   . Neurogenic bowel   . S/P VP shunt   . Spina bifida [facility])     Past Surgical History:  Procedure Laterality Date  . VENTRICULOPERITONEAL SHUNT      There were no vitals filed for this visit.  Subjective Assessment - 06/13/19 1108    Subjective  Pt reports doing well today; she reports soreness after last session that lasted a day.  No falls since last visit.  No new questions or concerns at this time.    Limitations  Walking;Standing;House hold activities;Other (comment)    How long can you stand comfortably?  require use of bilateral crutches    How long can you walk comfortably?  dependent upon terrain    Patient Stated Goals  Patient wants to be able to negotiate uneven terrain at school, cross thresholds in home environment, and negotiate steps in household     Currently in Pain?  No/denies         TREATMENT   CGA for all exercises, however patient is safe with all exercises     Neuromuscular Re-education    AFO's doffed for exercises in // bars    Side stepping in // bars with limited BUE support as little as needed 3x2 lengths;    Forward/backward ambulation in // bars with limited BUE support x9 forward x5 backwards lengths each;   Wide BOS static balance 3x30s; intermittent SUE support for steadying    Wide BOS balance with horizontal and vertical head turns 3x30s each   Staggered stance forward/backward weight shifting alt lead LE x60 each       Pt educated throughout session about proper posture and technique with exercises. Improved exercise technique, movement at target joints, use of target muscles after min to mod verbal, visual, tactile cues.     Pt demonstrates excellent motivation during session and is able to complete all exercises as instructed by therapist.  She demonstrates decreased hip extension with retrogait in the // bars, and compensates with hip circumduction for forward and retro gait.  BUE support on // bars used throughout.  She demonstrates a lateral trunk lean and excessive anterior pelvic tilt with side stepping, increasing with fatigue.   With static balance, she requires UE support, attempting for intermittent SUE support, with 1 second instances without UE support.  She needs continued PT services to continue improving her balance  and maintain her functional strength gains. Pt will benefit from PT services to address deficits in strength, balance, and mobility in order to improve and maintain full function at home and school.      PT Short Term Goals - 04/05/19 1453      PT SHORT TERM GOAL #1   Title  Patient will report compliance with HEP for continued strengthening and stability during functional mobility.     Baseline  HEP compliant    Time  2    Period  Weeks    Status  Achieved        PT Long Term Goals - 06/07/19 1604      PT LONG TERM GOAL #1   Title  Patient will be able to transfer from the floor to standing independently with use of LRAD    Baseline  difficult on different types  of floor. Slippery floors more challenging and how long on the floor. Only need assistance when not able to put crutch against surface. 9/25: can do mod I if immediately stands back up, requires assistance if is in position for long, more difficulty with harder surfaces; 10/25/18: able to perform on soft surfaces for loftstrand traction, learned how to shorten loftrands so it can be performed on tile without falling    Time  12    Period  Weeks    Status  Achieved      PT LONG TERM GOAL #2   Title  Patient will increase Berg Balance score by >51/56 points to be considered a low risk for falls for improved safety. (revised from >6 points improvement)    Baseline  11/02/16: 34/56 01/28/17: 39/56 6/5: 45/56; 08/23/2017 = 45/56, 43/56 10/11/17, 43/56 12/20/17, 12/27/17  43/56 6/20: 38/56 no UE support ; 8/8: 40/56 no UE support 9/6: deferred due to precautions; 9/25: 42/56; 12/08/18: 42/56 3/2: 45/56; 03/06/19: Unable to perform over telemedicine; 04/05/19: 43/56; 06/07/19: 42/56    Time  12    Period  Weeks    Status  Partially Met    Target Date  06/28/19      PT LONG TERM GOAL #3   Title  Patient will be able to transfer in and out of a large car with  a high seat with CGA  to improve ability to go to school/doctor visits.     Baseline  Patient requires min A for transfer into large SUV unless it has a step rail on the side; 5/15: pt requires assist with a really high car but can get out of an SUV without assist, typically needs more assits getting in and getting in on the L side; 6/20: if ledge able to do with little to no assistance.  9/6 deferred due to precautions 9/25: if ledge is able to do with little or not assistance; 12/07/18: if ledge is able to do with little or no assistance 3/2: if ledge able to perform; 03/06/19: Unchanged at this time; 04/05/19: Unchanged at this time; 06/07/19: very difficult at this time    Time  12    Period  Weeks    Status  Partially Met    Target Date  06/28/19      PT  LONG TERM GOAL #4   Title  Patient will complete a TUG test in < 12 seconds for independent mobility and decreased fall risk     Baseline  11.45; 08/23/2017 = 13.66 sec, 13.40 12/20/17, 12/27/17 12.07 sec; 5/15: 13.42 seconds 6/20: 12 seconds, 10/04/18:  13.0s; 12/07/18: 11.6 seconds; 04/05/19: 11.7s; 06/07/19: 15.69s    Time  12    Period  Weeks    Status  Partially Met    Target Date  06/28/19      PT LONG TERM GOAL #5   Title  Patient will improve 6 minute walk distance by > 150 ft for improved return to functional community activities     Baseline  740 01/28/2017: 768f 6/5: 795  730 on 07/19/17; 790 ft on 08/23/18,  740 feet, 730 feet 12/20/17, 12/27/17 650 feet 6/20: 665 8/8: 701 9/6: deferred due to precautions 9/25: 6454f 10/11/18: 64075f1/23/20: 715 feet 3/2: will perform next session with new AFO's; 03/06/19:  03/06/19: Unable to perform over telemedicine; 04/05/19: 700'    Time  12    Period  Weeks    Status  Deferred      PT LONG TERM GOAL #6   Title  Patient will improve gait speed to > 1.2 m/s with least restrictive assistive device to return to normal walking speed     Baseline  .76 m/h 01/28/2017: .87  07/19/17 0.91m56m0.77 m/s on 08/23/17, . 58 m/sec 10/11/17 .90m/63m6/20: 77 m/s  8/8: .72m/s76m: terminated due to precuations for HR 9/25: 0.62m/s,69mf-selected: 16.2s = 0.62 m/s, fastest: 14.2s = 0.70 m/s; self-selected: 20.1s=0.50 m/s fastest: 13.0s=0.77 m/s; 03/06/19:  03/06/19: Unable to perform over telemedicine; 04/05/19: self-selected: 16.7s=0.60 m/s fastest: 13.7s=0.73 m/s; 06/07/19:  self-selected 22.34s = 0.37m/s; 67mest 16.31s = 0.69m/s   72me  12    Period  Weeks    Status  Partially Met   06/28/19 target date     PT LONG TERM GOAL #7   Title  Patient (< 60 years 32d) will complete five times sit to stand test in < 10 seconds indicating an increased LE strength and improved balance     Baseline  9.45 sec; 9.63 on 08/23/17, 9.63 sec 12/27/17, 10.07 sec; 12/08/18: 10.2 seconds 3/2: 9  seconds no UE support; 04/05/19: 11.3s; 06/07/19:12.48s    Time  12    Period  Weeks    Status  Partially Met   06/28/19 target date     PT LONG TERM GOAL #8   Title  Patient will be able to ambulate on inclines and grass independenlty with LRAD    Baseline  Can ambulate over grassy inclines with decreased speed and safety requiring need loftstrand curtches and SBA; 5/15: pt reports she has to walk slowly and feels unsteady but that this is improving6/20 : reports cautious and slow walk, able to do mod I9/6:  deffered due to precautions9/25: able to do slowly and with caution due to fear of falling, able to do mod I, 10/25/18:  able to do slowly and with caution due to fear of falling, able to do mod I; 12/07/18: still able to perform with lofstrand crutches but remains a challenge with fear of falling 3/2: uphill more challenging than downhill, grass continues to be a challenge; 03/06/19: Unchanged; 04/05/19: Unchanged; 06/07/19: unchanged    Time  12    Period  Weeks    Status  On-going   06/28/19 target date     PT LONG TERM GOAL  #9   TITLE  Patient will be able to transfer from low chair or stool without UE support independently    Baseline  able to perform independently when not tired, but when fatigued requires support. 10/25/18: Able to perform when legs are not fatigued,  mostly limited by balance; 12/07/18: Able to perform from regular height chair and slightly lower surface, limited by balance and better when legs are not fatigued; 3/2: limited when LE's are <90 degree angle; 03/06/19: unchanged; 04/05/19: Unchanged; 06/07/19: unchanged    Time  12    Period  Weeks    Status  On-going   06/28/19 target date     PT LONG TERM GOAL  #10   TITLE  --    Baseline  --    Time  --    Period  --    Status  --   03/21/18 target date     PT LONG TERM GOAL  #11   TITLE  --    Baseline  --    Time  --    Period  --    Status  --   01/03/18 target date           Plan - 06/13/19 1323     Clinical Impression Statement  Pt demonstrates excellent motivation during session and is able to complete all exercises as instructed by therapist.  She demonstrates decreased hip extension with retrogait in the // bars, and compensates with hip circumduction for forward and retro gait.  BUE support on // bars used throughout.  She demonstrates a lateral trunk lean and excessive anterior pelvic tilt with side stepping, increasing with fatigue.   With static balance, she requires UE support, attempting for intermittent SUE support, with 1 second instances without UE support.  She needs continued PT services to continue improving her balance and maintain her functional strength gains. Pt will benefit from PT services to address deficits in strength, balance, and mobility in order to improve and maintain full function at home and school.    Personal Factors and Comorbidities  Comorbidity 1;Past/Current Experience;Social Background;Time since onset of injury/illness/exacerbation    Comorbidities  spina bifida    Examination-Activity Limitations  Bend;Carry;Continence;Lift;Reach Overhead;Squat;Stairs    Examination-Participation Restrictions  Church;Community Activity;Driving;Laundry;Meal Prep;Volunteer;Yard Work    Stability/Clinical Decision Making  Stable/Uncomplicated    Rehab Potential  Good    Clinical Impairments Affecting Rehab Potential  weakness and decreased standing balance    PT Frequency  1x / week    PT Duration  12 weeks    PT Treatment/Interventions  Therapeutic exercise;Therapeutic activities;Gait training;Balance training;Stair training;DME Instruction;Neuromuscular re-education;Patient/family education;Functional mobility training;Passive range of motion;Electrical Stimulation;Energy conservation;Orthotic Fit/Training;Manual techniques    PT Next Visit Plan  Update outcome measures. Continue strength/balance, continue working on step-ups to pratice curb negotiation when back in clinic.  Remove AFOs for additional LE strengthening;    PT Home Exercise Plan  added seated dynamic core UE and LE lift offs with visual cues using mirror, supine marches, Seated program: [id number]     Consulted and Agree with Plan of Care  Patient       Patient will benefit from skilled therapeutic intervention in order to improve the following deficits and impairments:  Abnormal gait, Decreased balance, Decreased endurance, Difficulty walking, Decreased strength, Decreased activity tolerance, Decreased coordination, Decreased mobility, Decreased range of motion, Impaired flexibility, Postural dysfunction, Improper body mechanics  Visit Diagnosis: 1. Muscle weakness (generalized)   2. Difficulty in walking, not elsewhere classified   3. Other lack of coordination        Problem List There are no active problems to display for this patient.   Lutricia Horsfall, SPT This entire session was performed under direct supervision and direction of a licensed therapist/therapist assistant . I  have personally read, edited and approve of the note as written.   Trotter,Margaret PT, DPT 06/14/2019, 7:57 AM  [facility] [address] [place], [place], [address] Phone: [phone]   Fax:  [phone]  Name: Michelle Randall MRN: [id number] Date of Birth: July 22, 1996

## 2019-06-18 ENCOUNTER — Ambulatory Visit: Payer: Medicaid Other

## 2019-06-20 NOTE — Addendum Note (Signed)
Addended by: Roxana Hires D on: 06/20/2019 01:25 PM   Modules accepted: Orders

## 2019-06-27 ENCOUNTER — Ambulatory Visit: Payer: Medicaid Other

## 2019-06-28 ENCOUNTER — Encounter: Payer: Self-pay | Admitting: Physical Therapy

## 2019-06-28 ENCOUNTER — Ambulatory Visit: Payer: Medicaid Other | Attending: Student

## 2019-06-28 DIAGNOSIS — R262 Difficulty in walking, not elsewhere classified: Secondary | ICD-10-CM | POA: Diagnosis present

## 2019-06-28 DIAGNOSIS — R278 Other lack of coordination: Secondary | ICD-10-CM

## 2019-06-28 DIAGNOSIS — M6281 Muscle weakness (generalized): Secondary | ICD-10-CM | POA: Diagnosis present

## 2019-06-28 DIAGNOSIS — R531 Weakness: Secondary | ICD-10-CM | POA: Diagnosis present

## 2019-06-28 NOTE — Therapy (Signed)
[place] MAIN [facility] SERVICES [address] [place], [place], [address] Phone: [phone]   Fax:  [phone]  Physical Therapy Treatment/Recertification  Patient Details  Name: Michelle Randall MRN: [id number] Date of Birth: 01/01/1996 No data recorded  Encounter Date: 06/28/2019  PT End of Session - 06/28/19 1026    Visit Number  170    Number of Visits  191    Date for PT Re-Evaluation  09/20/19    Authorization Type  last goals 06/07/19    Authorization Time Period  4 visits 06/28/19-07/25/19    Authorization - Visit Number  1    Authorization - Number of Visits  4    PT Start Time  0935    PT Stop Time  1015    PT Time Calculation (min)  40 min    Equipment Utilized During Treatment  Gait belt    Activity Tolerance  Patient tolerated treatment well    Behavior During Therapy  WFL for tasks assessed/performed       Past Medical History:  Diagnosis Date  . Neurogenic bladder   . Neurogenic bowel   . S/P VP shunt   . Spina bifida [facility])     Past Surgical History:  Procedure Laterality Date  . VENTRICULOPERITONEAL SHUNT      There were no vitals filed for this visit.  Subjective Assessment - 06/28/19 0939    Subjective  Pt reports doing well. She states that she forgot her appt was on a different day last week and missed coming in for therapy. No questions or concerns at this time.    Limitations  Walking;Standing;House hold activities;Other (comment)    How long can you stand comfortably?  require use of bilateral crutches    How long can you walk comfortably?  dependent upon terrain    Patient Stated Goals  Patient wants to be able to negotiate uneven terrain at school, cross thresholds in home environment, and negotiate steps in household     Currently in Pain?  No/denies        TREATMENT CGA for all exercises, however patient is safe with all exercises    Therapeutic Exercise Hooklying clams with min manual resistance  x10; Hooklying adductor squeeze with min manual resistance x10; Hooklying SLR x10 each; Supine SAQ with bolster under knees x10 each;   Mini squats in // bars with BUE support 2 x 10, cues to keep knees separated; Seated LAQ x10 each LE     Neuromuscular Re-education  AFO's doffed for exercises in // bars Side stepping in // bars with limited BUE support as little as needed x3 lengths each ; Forward/backward ambulation in // bars with limited BUE support x3 lengths each; Wide BOS static balance x30s;  Wide BOS stance with M/L weight shifting x60s Staggered stance A/P weight shifting alt lead LE x60s each Staggered stance balance x30s each;  Alternating lead LE 6" step-ups with BUE support x8 each  Alternating step ups 2" foam with cues for hip flexion without compensation x10 each       Pt educated throughout session about proper posture and technique with exercises. Improved exercise technique, movement at target joints, use of target muscles after min to mod verbal, visual, tactile cues.      Pt demonstrates excellent motivation during session and is able to complete all exercises as instructed by therapist. Pt continues to demonstrate significant LE external rotator and adductor weakness with exercises like hooklying clamshells and  adductor squeeze. Pt demonstrates significant hip flexor weakness during step ups requiring max BUE support. She compensates for weak hip flexors bilaterally by performing bilateral hip circumduction, anterior pelvic tilt and lateral trunk leaning during 6" step ups requiring mod verbal and tactile cueing. Pt is able to demonstrate improved hip flexion and reduced compensatory strategies with 2" step up. She demonstrates bilateral hip abduction weakness with side stepping compensating with significant bilateral lateral trunk leaning. Pt requires min-mod verbal cueing for mini squats to dec medial collapse of knees for proper muscle engagement. Pt requires mod  tactile cueing at the pelvis during steps ups and side stepping to minimize compensatory strategies to target desired muscle groups that demonstrate weakness such as bilateral hip flexors, abductors and external rotators. Shedemonstrates unchanged progress at this time in regards to outcome measures. However, she does demonstrate improved endurance and activity tolerance as she does not require as frequent rest breaks between each exercise. Pt will benefit from PT services to address deficits in strength, balance, and mobility in order to improve and maintain full function at home and school.     PT Short Term Goals - 04/05/19 1453      PT SHORT TERM GOAL #1   Title  Patient will report compliance with HEP for continued strengthening and stability during functional mobility.     Baseline  HEP compliant    Time  2    Period  Weeks    Status  Achieved        PT Long Term Goals - 06/28/19 1039      PT LONG TERM GOAL #1   Title  Patient will be able to transfer from the floor to standing independently with use of LRAD    Baseline  difficult on different types of floor. Slippery floors more challenging and how long on the floor. Only need assistance when not able to put crutch against surface. 9/25: can do mod I if immediately stands back up, requires assistance if is in position for long, more difficulty with harder surfaces; 10/25/18: able to perform on soft surfaces for loftstrand traction, learned how to shorten loftrands so it can be performed on tile without falling    Time  12    Period  Weeks    Status  Achieved      PT LONG TERM GOAL #2   Title  Patient will increase Berg Balance score by >51/56 points to be considered a low risk for falls for improved safety. (revised from >6 points improvement)    Baseline  11/02/16: 34/56 01/28/17: 39/56 6/5: 45/56; 08/23/2017 = 45/56, 43/56 10/11/17, 43/56 12/20/17, 12/27/17  43/56 6/20: 38/56 no UE support ; 8/8: 40/56 no UE support 9/6: deferred due  to precautions; 9/25: 42/56; 12/08/18: 42/56 3/2: 45/56; 03/06/19: Unable to perform over telemedicine; 04/05/19: 43/56; 06/07/19: 42/56    Time  12    Period  Weeks    Status  Partially Met    Target Date  09/20/19      PT LONG TERM GOAL #3   Title  Patient will be able to transfer in and out of a large car with  a high seat with CGA  to improve ability to go to school/doctor visits.     Baseline  Patient requires min A for transfer into large SUV unless it has a step rail on the side; 5/15: pt requires assist with a really high car but can get out of an SUV without assist, typically needs  more assits getting in and getting in on the L side; 6/20: if ledge able to do with little to no assistance.  9/6 deferred due to precautions 9/25: if ledge is able to do with little or not assistance; 12/07/18: if ledge is able to do with little or no assistance 3/2: if ledge able to perform; 03/06/19: Unchanged at this time; 04/05/19: Unchanged at this time; 06/07/19: very difficult at this time    Time  12    Period  Weeks    Status  Partially Met    Target Date  09/20/19      PT LONG TERM GOAL #4   Title  Patient will complete a TUG test in < 12 seconds for independent mobility and decreased fall risk     Baseline  11.45; 08/23/2017 = 13.66 sec, 13.40 12/20/17, 12/27/17 12.07 sec; 5/15: 13.42 seconds 6/20: 12 seconds, 10/04/18: 13.0s; 12/07/18: 11.6 seconds; 04/05/19: 11.7s; 06/07/19: 15.69s    Time  12    Period  Weeks    Status  Partially Met    Target Date  09/20/19      PT LONG TERM GOAL #5   Title  Patient will improve 6 minute walk distance by > 150 ft for improved return to functional community activities     Baseline  740 01/28/2017: 739f 6/5: 795  730 on 07/19/17; 790 ft on 08/23/18,  740 feet, 730 feet 12/20/17, 12/27/17 650 feet 6/20: 665 8/8: 701 9/6: deferred due to precautions 9/25: 6462f 10/11/18: 64030f1/23/20: 715 feet 3/2: will perform next session with new AFO's; 03/06/19:  03/06/19: Unable to perform  over telemedicine; 04/05/19: 700'    Time  12    Period  Weeks    Status  Deferred    Target Date  09/20/19      PT LONG TERM GOAL #6   Title  Patient will improve gait speed to > 1.2 m/s with least restrictive assistive device to return to normal walking speed     Baseline  .76 m/h 01/28/2017: .87  07/19/17 0.71m78m0.77 m/s on 08/23/17, . 58 m/sec 10/11/17 .75m/15m6/20: 77 m/s  8/8: .84m/s5m: terminated due to precuations for HR 9/25: 0.78m/s,19mf-selected: 16.2s = 0.62 m/s, fastest: 14.2s = 0.70 m/s; self-selected: 20.1s=0.50 m/s fastest: 13.0s=0.77 m/s; 03/06/19:  03/06/19: Unable to perform over telemedicine; 04/05/19: self-selected: 16.7s=0.60 m/s fastest: 13.7s=0.73 m/s; 06/07/19:  self-selected 22.34s = 0.76m/s; 75mest 16.31s = 0.5m/s   76me  12    Period  Weeks    Status  Partially Met   09/20/2019 target date     PT LONG TERM GOAL #7   Title  Patient (< 60 years 26d) will complete five times sit to stand test in < 10 seconds indicating an increased LE strength and improved balance     Baseline  9.45 sec; 9.63 on 08/23/17, 9.63 sec 12/27/17, 10.07 sec; 12/08/18: 10.2 seconds 3/2: 9 seconds no UE support; 04/05/19: 11.3s; 06/07/19:12.48s    Time  12    Period  Weeks    Status  Partially Met   09/20/2019 target date     PT LONG TERM GOAL #8   Title  Patient will be able to ambulate on inclines and grass independenlty with LRAD    Baseline  Can ambulate over grassy inclines with decreased speed and safety requiring need loftstrand curtches and SBA; 5/15: pt reports she has to walk slowly and feels unsteady but that this is improving6/20 : reports cautious  and slow walk, able to do mod I9/6:  deffered due to precautions9/25: able to do slowly and with caution due to fear of falling, able to do mod I, 10/25/18:  able to do slowly and with caution due to fear of falling, able to do mod I; 12/07/18: still able to perform with lofstrand crutches but remains a challenge with fear of falling 3/2: uphill  more challenging than downhill, grass continues to be a challenge; 03/06/19: Unchanged; 04/05/19: Unchanged; 06/07/19: unchanged    Time  12    Period  Weeks    Status  On-going   09/20/2019 target date     PT LONG TERM GOAL  #9   TITLE  Patient will be able to transfer from low chair or stool without UE support independently    Baseline  able to perform independently when not tired, but when fatigued requires support. 10/25/18: Able to perform when legs are not fatigued, mostly limited by balance; 12/07/18: Able to perform from regular height chair and slightly lower surface, limited by balance and better when legs are not fatigued; 3/2: limited when LE's are <90 degree angle; 03/06/19: unchanged; 04/05/19: Unchanged; 06/07/19: unchanged    Time  12    Period  Weeks    Status  On-going   09/20/2019 target date     PT LONG TERM GOAL  #10   Status  --   03/21/18 target date     PT LONG TERM GOAL  #11   Status  --   01/03/18 target date           Plan - 06/28/19 1027    Clinical Impression Statement  Pt demonstrates excellent motivation during session and is able to complete all exercises as instructed by therapist. Pt continues to demonstrate significant LE external rotator and adductor weakness with exercises like hooklying clamshells and adductor squeeze. Pt demonstrates significant hip flexor weakness during step ups requiring max BUE support. She compensates for weak hip flexors bilaterally by performing bilateral hip circumduction, anterior pelvic tilt and lateral trunk leaning during 6" step ups requiring mod verbal and tactile cueing. Pt is able to demonstrate improved hip flexion and reduced compensatory strategies with 2" step up. She demonstrates bilateral hip abduction weakness with side stepping compensating with significant bilateral lateral trunk leaning. Pt requires min-mod verbal cueing for mini squats to dec medial collapse of knees for proper muscle engagement. Pt requires mod  tactile cueing at the pelvis during steps ups and side stepping to minimize compensatory strategies to target desired muscle groups that demonstrate weakness such as bilateral hip flexors, abductors and external rotators. She demonstrates unchanged progress at this time in regards to outcome measures. However, she does demonstrate improved endurance and activity tolerance as she does not require as frequent rest breaks between each exercise. Pt will benefit from PT services to address deficits in strength, balance, and mobility in order to improve and maintain full function at home and school.    Personal Factors and Comorbidities  Comorbidity 1;Past/Current Experience;Social Background;Time since onset of injury/illness/exacerbation    Comorbidities  spina bifida    Examination-Activity Limitations  Bend;Carry;Continence;Lift;Reach Overhead;Squat;Stairs    Examination-Participation Restrictions  Church;Community Activity;Driving;Laundry;Meal Prep;Volunteer;Yard Work    Stability/Clinical Decision Making  Stable/Uncomplicated    Rehab Potential  Good    Clinical Impairments Affecting Rehab Potential  weakness and decreased standing balance    PT Frequency  1x / week    PT Duration  12 weeks    PT  Treatment/Interventions  Therapeutic exercise;Therapeutic activities;Gait training;Balance training;Stair training;DME Instruction;Neuromuscular re-education;Patient/family education;Functional mobility training;Passive range of motion;Electrical Stimulation;Energy conservation;Orthotic Fit/Training;Manual techniques    PT Next Visit Plan  Update outcome measures. Continue strength/balance, continue working on step-ups to pratice curb negotiation when back in clinic. Remove AFOs for additional LE strengthening;    PT Home Exercise Plan  added seated dynamic core UE and LE lift offs with visual cues using mirror, supine marches, Seated program: [id number]     Consulted and Agree with Plan of Care  Patient        Patient will benefit from skilled therapeutic intervention in order to improve the following deficits and impairments:  Abnormal gait, Decreased balance, Decreased endurance, Difficulty walking, Decreased strength, Decreased activity tolerance, Decreased coordination, Decreased mobility, Decreased range of motion, Impaired flexibility, Postural dysfunction, Improper body mechanics  Visit Diagnosis: 1. Muscle weakness (generalized)   2. Difficulty in walking, not elsewhere classified   3. Other lack of coordination   4. Weakness generalized    This entire session was performed under the direct supervision of a liscensed physical therapist. During this treatment session, the therapist was present throughout. participation in and directing the treatment as needed. I have reviewed and agree with student's findings and recommendations.   4:42 PM, 06/28/19 Etta Grandchild, PT, DPT Physical Therapist - [facility]  [phone] [facility])      Problem List There are no active problems to display for this patient.  Juliann Pulse SPT  Buccola,Allan C 06/28/2019, 4:42 PM  [place] MAIN [facility] SERVICES [address] [name], [place], [address] Phone: [phone]   Fax:  [phone]  Name: KAYNA SUPPA MRN: [id number] Date of Birth: 07/19/1996

## 2019-07-05 ENCOUNTER — Ambulatory Visit: Payer: Medicaid Other | Admitting: Physical Therapy

## 2019-07-05 ENCOUNTER — Encounter: Payer: Self-pay | Admitting: Physical Therapy

## 2019-07-05 ENCOUNTER — Other Ambulatory Visit: Payer: Self-pay

## 2019-07-05 DIAGNOSIS — R531 Weakness: Secondary | ICD-10-CM

## 2019-07-05 DIAGNOSIS — M6281 Muscle weakness (generalized): Secondary | ICD-10-CM | POA: Diagnosis not present

## 2019-07-05 DIAGNOSIS — R278 Other lack of coordination: Secondary | ICD-10-CM

## 2019-07-05 DIAGNOSIS — R262 Difficulty in walking, not elsewhere classified: Secondary | ICD-10-CM

## 2019-07-05 NOTE — Therapy (Signed)
[place] MAIN [facility] SERVICES [address] [place], [place], [address] Phone: [phone]   Fax:  [phone]  Physical Therapy Treatment  Patient Details  Name: Michelle Randall MRN: [id number] Date of Birth: [birth year] No data recorded  Encounter Date: 07/05/2019  PT End of Session - 07/05/19 1355    Visit Number  [id number]    Number of Visits  191    Date for PT Re-Evaluation  09/20/19    Authorization Type  last goals 06/07/19    Authorization Time Period  4 visits 06/28/19-07/25/19    Authorization - Visit Number  2    Authorization - Number of Visits  4    PT Start Time  3888    PT Stop Time  1430    PT Time Calculation (min)  42 min    Equipment Utilized During Treatment  Gait belt    Activity Tolerance  Patient tolerated treatment well    Behavior During Therapy  WFL for tasks assessed/performed       Past Medical History:  Diagnosis Date  . Neurogenic bladder   . Neurogenic bowel   . S/P VP shunt   . Spina bifida [facility])     Past Surgical History:  Procedure Laterality Date  . VENTRICULOPERITONEAL SHUNT      There were no vitals filed for this visit.  Subjective Assessment - 07/05/19 1354    Subjective  Patient reports doing well; denies any soreness; reports that she is still having some weakness in her core and legs and feeling unsteady;    Limitations  Walking;Standing;House hold activities;Other (comment)    How long can you stand comfortably?  require use of bilateral crutches    How long can you walk comfortably?  dependent upon terrain    Patient Stated Goals  Patient wants to be able to negotiate uneven terrain at school, cross thresholds in home environment, and negotiate steps in household     Currently in Pain?  No/denies    Multiple Pain Sites  No          TREATMENT: NMR: Patient doffed AFO in parallel bars to challenge dynamic/static balance: Forward/backward walking with B rail assist x3 laps with min VCs  to increase step length, improve erect posture and increase hip extension for better stance control;  Isometric reversals for pelvic anterior/posterior activation in staggered stance x5 reps each foot in front x2 sets;  Standing on airex: -one foot on 4 inch step, one foot on airex, unsupported with head turns side/side x5 reps each direction; Patient required min A for ankle stabilization as she often pronates and collapses internally; required cues for erect posture and to improve upper trunk control for less hip flexion and less compensation; -feet together: arms across chest trunk rotation x5 reps each direction with min A for safety; Patient required cues for improved core activation/upper trunk control for better balance. She does exhibit increased flexion and rotation with instability due to weakness in LE and impaired weight shift;     Exercise: Patient transitioned to tall kneeling: -squat to heels and come up to tall kneeling x10 reps with min A for balance and cues for increased hip extension; -tall kneeling 3# bar shoulder flexion with min A for balance and cues for hip extension and proper exercise technique for better core activation;  -tall kneeling holding ball trunk rotation x10 reps each direction with min A for balance and cues for increased upper trunk control for better  balance;  -Qped: alternate UE lift x10 reps each UE with min A for balance; Qped alternate LE lift x5 reps each LE; Patient required mod VCs to increase core activation for better trunk control and to increase hip extension for increased ROM; patient exhibits increased weakness with LE movement compared to UE movement;  Response to treatment: tolerated well. Patient able to exhibit better gluteal activation in standing balance exercise. She did fatigue quickly with advanced kneeling exercise requiring short seated rest breaks. Verbalized understanding for ways to advance HEP;                PT  Education - 07/05/19 1355    Education provided  Yes    Education Details  balance/strength; HEP    Person(s) Educated  Patient    Methods  Explanation;Verbal cues    Comprehension  Verbalized understanding;Returned demonstration;Verbal cues required;Need further instruction       PT Short Term Goals - 04/05/19 1453      PT SHORT TERM GOAL #1   Title  Patient will report compliance with HEP for continued strengthening and stability during functional mobility.     Baseline  HEP compliant    Time  2    Period  Weeks    Status  Achieved        PT Long Term Goals - 06/28/19 1039      PT LONG TERM GOAL #1   Title  Patient will be able to transfer from the floor to standing independently with use of LRAD    Baseline  difficult on different types of floor. Slippery floors more challenging and how long on the floor. Only need assistance when not able to put crutch against surface. 9/25: can do mod I if immediately stands back up, requires assistance if is in position for long, more difficulty with harder surfaces; 10/25/18: able to perform on soft surfaces for loftstrand traction, learned how to shorten loftrands so it can be performed on tile without falling    Time  12    Period  Weeks    Status  Achieved      PT LONG TERM GOAL #2   Title  Patient will increase Berg Balance score by >51/56 points to be considered a low risk for falls for improved safety. (revised from >6 points improvement)    Baseline  11/02/16: 34/56 01/28/17: 39/56 6/5: 45/56; 08/23/2017 = 45/56, 43/56 10/11/17, 43/56 12/20/17, 12/27/17  43/56 6/20: 38/56 no UE support ; 8/8: 40/56 no UE support 9/6: deferred due to precautions; 9/25: 42/56; 12/08/18: 42/56 3/2: 45/56; 03/06/19: Unable to perform over telemedicine; 04/05/19: 43/56; 06/07/19: 42/56    Time  12    Period  Weeks    Status  Partially Met    Target Date  09/20/19      PT LONG TERM GOAL #3   Title  Patient will be able to transfer in and out of a large car with   a high seat with CGA  to improve ability to go to school/doctor visits.     Baseline  Patient requires min A for transfer into large SUV unless it has a step rail on the side; 5/15: pt requires assist with a really high car but can get out of an SUV without assist, typically needs more assits getting in and getting in on the L side; 6/20: if ledge able to do with little to no assistance.  9/6 deferred due to precautions 9/25: if ledge is able  to do with little or not assistance; 12/07/18: if ledge is able to do with little or no assistance 3/2: if ledge able to perform; 03/06/19: Unchanged at this time; 04/05/19: Unchanged at this time; 06/07/19: very difficult at this time    Time  12    Period  Weeks    Status  Partially Met    Target Date  09/20/19      PT LONG TERM GOAL #4   Title  Patient will complete a TUG test in < 12 seconds for independent mobility and decreased fall risk     Baseline  11.45; 08/23/2017 = 13.66 sec, 13.40 12/20/17, 12/27/17 12.07 sec; 5/15: 13.42 seconds 6/20: 12 seconds, 10/04/18: 13.0s; 12/07/18: 11.6 seconds; 04/05/19: 11.7s; 06/07/19: 15.69s    Time  12    Period  Weeks    Status  Partially Met    Target Date  09/20/19      PT LONG TERM GOAL #5   Title  Patient will improve 6 minute walk distance by > 150 ft for improved return to functional community activities     Baseline  740 01/28/2017: 772f 6/5: 795  730 on 07/19/17; 790 ft on 08/23/18,  740 feet, 730 feet 12/20/17, 12/27/17 650 feet 6/20: 665 8/8: 701 9/6: deferred due to precautions 9/25: 6451f 10/11/18: 64010f1/23/20: 715 feet 3/2: will perform next session with new AFO's; 03/06/19:  03/06/19: Unable to perform over telemedicine; 04/05/19: 700'    Time  12    Period  Weeks    Status  Deferred    Target Date  09/20/19      PT LONG TERM GOAL #6   Title  Patient will improve gait speed to > 1.2 m/s with least restrictive assistive device to return to normal walking speed     Baseline  .76 m/h 01/28/2017: .87  07/19/17  0.58m27m0.77 m/s on 08/23/17, . 58 m/sec 10/11/17 .66m/36m6/20: 77 m/s  8/8: .3m/s3m: terminated due to precuations for HR 9/25: 0.23m/s,68mf-selected: 16.2s = 0.62 m/s, fastest: 14.2s = 0.70 m/s; self-selected: 20.1s=0.50 m/s fastest: 13.0s=0.77 m/s; 03/06/19:  03/06/19: Unable to perform over telemedicine; 04/05/19: self-selected: 16.7s=0.60 m/s fastest: 13.7s=0.73 m/s; 06/07/19:  self-selected 22.34s = 0.36m/s; 75mest 16.31s = 0.11m/s   68me  12    Period  Weeks    Status  Partially Met   09/20/2019 target date     PT LONG TERM GOAL #7   Title  Patient (< 60 years 60d) will complete five times sit to stand test in < 10 seconds indicating an increased LE strength and improved balance     Baseline  9.45 sec; 9.63 on 08/23/17, 9.63 sec 12/27/17, 10.07 sec; 12/08/18: 10.2 seconds 3/2: 9 seconds no UE support; 04/05/19: 11.3s; 06/07/19:12.48s    Time  12    Period  Weeks    Status  Partially Met   09/20/2019 target date     PT LONG TERM GOAL #8   Title  Patient will be able to ambulate on inclines and grass independenlty with LRAD    Baseline  Can ambulate over grassy inclines with decreased speed and safety requiring need loftstrand curtches and SBA; 5/15: pt reports she has to walk slowly and feels unsteady but that this is improving6/20 : reports cautious and slow walk, able to do mod I9/6:  deffered due to precautions9/25: able to do slowly and with caution due to fear of falling, able to do mod I, 10/25/18:  able  to do slowly and with caution due to fear of falling, able to do mod I; 12/07/18: still able to perform with lofstrand crutches but remains a challenge with fear of falling 3/2: uphill more challenging than downhill, grass continues to be a challenge; 03/06/19: Unchanged; 04/05/19: Unchanged; 06/07/19: unchanged    Time  12    Period  Weeks    Status  On-going   09/20/2019 target date     PT LONG TERM GOAL  #9   TITLE  Patient will be able to transfer from low chair or stool without UE  support independently    Baseline  able to perform independently when not tired, but when fatigued requires support. 10/25/18: Able to perform when legs are not fatigued, mostly limited by balance; 12/07/18: Able to perform from regular height chair and slightly lower surface, limited by balance and better when legs are not fatigued; 3/2: limited when LE's are <90 degree angle; 03/06/19: unchanged; 04/05/19: Unchanged; 06/07/19: unchanged    Time  12    Period  Weeks    Status  On-going   09/20/2019 target date     PT LONG TERM GOAL  #10   Status  --   03/21/18 target date     PT LONG TERM GOAL  #11   Status  --   01/03/18 target date           Plan - 07/05/19 1527    Clinical Impression Statement  Patient motivated and participated well within session. Instructed patient in advanced balance exercise with cues for weight shift and increased gluteal activation for better stance control. Patient able to exhibit better step length with improved pelvic rotation; Advanced LE/core strengthening with kneeling activities. Patient fatigues quickly requiring short rest breaks. She would benefit from additional skilled PT intervention to improve strength, balance and mobility;    Personal Factors and Comorbidities  Comorbidity 1;Past/Current Experience;Social Background;Time since onset of injury/illness/exacerbation    Comorbidities  spina bifida    Examination-Activity Limitations  Bend;Carry;Continence;Lift;Reach Overhead;Squat;Stairs    Examination-Participation Restrictions  Church;Community Activity;Driving;Laundry;Meal Prep;Volunteer;Yard Work    Stability/Clinical Decision Making  Stable/Uncomplicated    Rehab Potential  Good    Clinical Impairments Affecting Rehab Potential  weakness and decreased standing balance    PT Frequency  1x / week    PT Duration  12 weeks    PT Treatment/Interventions  Therapeutic exercise;Therapeutic activities;Gait training;Balance training;Stair training;DME  Instruction;Neuromuscular re-education;Patient/family education;Functional mobility training;Passive range of motion;Electrical Stimulation;Energy conservation;Orthotic Fit/Training;Manual techniques    PT Next Visit Plan  Update outcome measures. Continue strength/balance, continue working on step-ups to pratice curb negotiation when back in clinic. Remove AFOs for additional LE strengthening;    PT Home Exercise Plan  added seated dynamic core UE and LE lift offs with visual cues using mirror, supine marches, Seated program: [id number]     Consulted and Agree with Plan of Care  Patient       Patient will benefit from skilled therapeutic intervention in order to improve the following deficits and impairments:  Abnormal gait, Decreased balance, Decreased endurance, Difficulty walking, Decreased strength, Decreased activity tolerance, Decreased coordination, Decreased mobility, Decreased range of motion, Impaired flexibility, Postural dysfunction, Improper body mechanics  Visit Diagnosis: Muscle weakness (generalized)  Difficulty in walking, not elsewhere classified  Other lack of coordination  Weakness generalized     Problem List There are no active problems to display for this patient.   Dellas Guard PT, DPT 07/05/2019, 3:29 PM  [place]  [place] MAIN [facility] SERVICES [place], [place], [address] Phone: [phone]   Fax:  [phone]  Name: Michelle Randall MRN: [id number] Date of Birth: 16-Nov-1995

## 2019-07-11 ENCOUNTER — Other Ambulatory Visit: Payer: Self-pay

## 2019-07-11 ENCOUNTER — Ambulatory Visit: Payer: Medicaid Other

## 2019-07-11 DIAGNOSIS — M6281 Muscle weakness (generalized): Secondary | ICD-10-CM

## 2019-07-11 DIAGNOSIS — R262 Difficulty in walking, not elsewhere classified: Secondary | ICD-10-CM

## 2019-07-11 NOTE — Therapy (Signed)
[place] MAIN [facility] SERVICES [address] [place], [place], [address] Phone: [phone]   Fax:  [phone]  Physical Therapy Treatment  Patient Details  Name: Michelle Randall MRN: [id number] Date of Birth: September 11, 1996 No data recorded  Encounter Date: 07/11/2019  PT End of Session - 07/11/19 1545    Visit Number  [id number]    Number of Visits  191    Date for PT Re-Evaluation  09/20/19    Authorization Type  last goals 06/07/19    Authorization Time Period  4 visits 06/28/19-07/25/19    Authorization - Visit Number  3    Authorization - Number of Visits  4    PT Start Time  8341    PT Stop Time  1600    PT Time Calculation (min)  42 min    Equipment Utilized During Treatment  Gait belt    Activity Tolerance  Patient tolerated treatment well    Behavior During Therapy  WFL for tasks assessed/performed       Past Medical History:  Diagnosis Date  . Neurogenic bladder   . Neurogenic bowel   . S/P VP shunt   . Spina bifida [facility])     Past Surgical History:  Procedure Laterality Date  . VENTRICULOPERITONEAL SHUNT      There were no vitals filed for this visit.  Subjective Assessment - 07/11/19 1544    Subjective  Patient reports doing well; denies any soreness. HEP is going well. No specific questions or concerns at this time.    Limitations  Walking;Standing;House hold activities;Other (comment)    How long can you stand comfortably?  require use of bilateral crutches    How long can you walk comfortably?  dependent upon terrain    Patient Stated Goals  Patient wants to be able to negotiate uneven terrain at school, cross thresholds in home environment, and negotiate steps in household     Currently in Pain?  No/denies          TREATMENT   Neuromuscular Re-education  Patient doffed AFO for treatment and all exercises performed in parallel bars with CGA for safety: Lateral sidestepping with rail assist x 4 lengths with min  VCs; Forward/backward walking with BUE rail assist x 4 lengths with min VCs to increase step length, improve erect posture; Balance without UE support but 2 finger assist occasionally in wide stance x 30s; Balance without UE support but 2 finger assist occasionally in staggered stance alternating forward LE x 30s each; Balance without UE support but 2 finger assist occasionally in wide stance on Airex pad x 30s; 2" Airex toe taps with BUE support x 10 bilateral;   Ther-ex  Rolling stool seated forward scooting length of parallel bars x 2 lengths; Rolling stool seated backward scooting length of parallel bars x 2 lengths; Squats with BUE support x 15; Lunges alternating forward LUE with BUE support x 15; Seated LAQ with gentle manual resistance x 15 each; Physioball seated balance with lateral abduction steps alternating LE x 10 each; Physioball seated balance with forward/backward steps alternating LE x 10 each; Physioball seated balance with hip flexion marching alternating LE x 10 each;   Response to treatment: tolerated well. Patient able to exhibit better gluteal activation in standing balance exercise. She did fatigue quickly with advanced kneeling exercise requiring short seated rest breaks. Verbalized understanding for ways to advance HEP;   Patient motivated and participated well within session. Instructed patient in advanced balance  exercises including seated physioball stability. She continues to demonstrate limited standing balance without UE support. [place] will contact PCP and request and order for a wheelchair evaluation to be scheduled with Zara Council of [employer] [facility] [employer]. She has one more approved visit for this calendar year and will need updated outcome measures, goals, and discharge. She would benefit from additional skilled PT intervention to improve strength, balance and mobility.                    PT Short Term Goals - 04/05/19 1453       PT SHORT TERM GOAL #1   Title  Patient will report compliance with HEP for continued strengthening and stability during functional mobility.     Baseline  HEP compliant    Time  2    Period  Weeks    Status  Achieved        PT Long Term Goals - 06/28/19 1039      PT LONG TERM GOAL #1   Title  Patient will be able to transfer from the floor to standing independently with use of LRAD    Baseline  difficult on different types of floor. Slippery floors more challenging and how long on the floor. Only need assistance when not able to put crutch against surface. 9/25: can do mod I if immediately stands back up, requires assistance if is in position for long, more difficulty with harder surfaces; 10/25/18: able to perform on soft surfaces for loftstrand traction, learned how to shorten loftrands so it can be performed on tile without falling    Time  12    Period  Weeks    Status  Achieved      PT LONG TERM GOAL #2   Title  Patient will increase Berg Balance score by >51/56 points to be considered a low risk for falls for improved safety. (revised from >6 points improvement)    Baseline  11/02/16: 34/56 01/28/17: 39/56 6/5: 45/56; 08/23/2017 = 45/56, 43/56 10/11/17, 43/56 12/20/17, 12/27/17  43/56 6/20: 38/56 no UE support ; 8/8: 40/56 no UE support 9/6: deferred due to precautions; 9/25: 42/56; 12/08/18: 42/56 3/2: 45/56; 03/06/19: Unable to perform over telemedicine; 04/05/19: 43/56; 06/07/19: 42/56    Time  12    Period  Weeks    Status  Partially Met    Target Date  09/20/19      PT LONG TERM GOAL #3   Title  Patient will be able to transfer in and out of a large car with  a high seat with CGA  to improve ability to go to school/doctor visits.     Baseline  Patient requires min A for transfer into large SUV unless it has a step rail on the side; 5/15: pt requires assist with a really high car but can get out of an SUV without assist, typically needs more assits getting in and getting in on the L  side; 6/20: if ledge able to do with little to no assistance.  9/6 deferred due to precautions 9/25: if ledge is able to do with little or not assistance; 12/07/18: if ledge is able to do with little or no assistance 3/2: if ledge able to perform; 03/06/19: Unchanged at this time; 04/05/19: Unchanged at this time; 06/07/19: very difficult at this time    Time  12    Period  Weeks    Status  Partially Met    Target Date  09/20/19  PT LONG TERM GOAL #4   Title  Patient will complete a TUG test in < 12 seconds for independent mobility and decreased fall risk     Baseline  11.45; 08/23/2017 = 13.66 sec, 13.40 12/20/17, 12/27/17 12.07 sec; 5/15: 13.42 seconds 6/20: 12 seconds, 10/04/18: 13.0s; 12/07/18: 11.6 seconds; 04/05/19: 11.7s; 06/07/19: 15.69s    Time  12    Period  Weeks    Status  Partially Met    Target Date  09/20/19      PT LONG TERM GOAL #5   Title  Patient will improve 6 minute walk distance by > 150 ft for improved return to functional community activities     Baseline  740 01/28/2017: 719f 6/5: 795  730 on 07/19/17; 790 ft on 08/23/18,  740 feet, 730 feet 12/20/17, 12/27/17 650 feet 6/20: 665 8/8: 701 9/6: deferred due to precautions 9/25: 6469f 10/11/18: 64051f1/23/20: 715 feet 3/2: will perform next session with new AFO's; 03/06/19:  03/06/19: Unable to perform over telemedicine; 04/05/19: 700'    Time  12    Period  Weeks    Status  Deferred    Target Date  09/20/19      PT LONG TERM GOAL #6   Title  Patient will improve gait speed to > 1.2 m/s with least restrictive assistive device to return to normal walking speed     Baseline  .76 m/h 01/28/2017: .87  07/19/17 0.23m54m0.77 m/s on 08/23/17, . 58 m/sec 10/11/17 .19m/42m6/20: 77 m/s  8/8: .108m/s19m: terminated due to precuations for HR 9/25: 0.76m/s,51mf-selected: 16.2s = 0.62 m/s, fastest: 14.2s = 0.70 m/s; self-selected: 20.1s=0.50 m/s fastest: 13.0s=0.77 m/s; 03/06/19:  03/06/19: Unable to perform over telemedicine; 04/05/19: self-selected:  16.7s=0.60 m/s fastest: 13.7s=0.73 m/s; 06/07/19:  self-selected 22.34s = 0.76m/s; 65mest 16.31s = 0.29m/s   53me  12    Period  Weeks    Status  Partially Met   09/20/2019 target date     PT LONG TERM GOAL #7   Title  Patient (< 60 years 12d) will complete five times sit to stand test in < 10 seconds indicating an increased LE strength and improved balance     Baseline  9.45 sec; 9.63 on 08/23/17, 9.63 sec 12/27/17, 10.07 sec; 12/08/18: 10.2 seconds 3/2: 9 seconds no UE support; 04/05/19: 11.3s; 06/07/19:12.48s    Time  12    Period  Weeks    Status  Partially Met   09/20/2019 target date     PT LONG TERM GOAL #8   Title  Patient will be able to ambulate on inclines and grass independenlty with LRAD    Baseline  Can ambulate over grassy inclines with decreased speed and safety requiring need loftstrand curtches and SBA; 5/15: pt reports she has to walk slowly and feels unsteady but that this is improving6/20 : reports cautious and slow walk, able to do mod I9/6:  deffered due to precautions9/25: able to do slowly and with caution due to fear of falling, able to do mod I, 10/25/18:  able to do slowly and with caution due to fear of falling, able to do mod I; 12/07/18: still able to perform with lofstrand crutches but remains a challenge with fear of falling 3/2: uphill more challenging than downhill, grass continues to be a challenge; 03/06/19: Unchanged; 04/05/19: Unchanged; 06/07/19: unchanged    Time  12    Period  Weeks    Status  On-going   09/20/2019 target  date     PT LONG TERM GOAL  #9   TITLE  Patient will be able to transfer from low chair or stool without UE support independently    Baseline  able to perform independently when not tired, but when fatigued requires support. 10/25/18: Able to perform when legs are not fatigued, mostly limited by balance; 12/07/18: Able to perform from regular height chair and slightly lower surface, limited by balance and better when legs are not fatigued; 3/2:  limited when LE's are <90 degree angle; 03/06/19: unchanged; 04/05/19: Unchanged; 06/07/19: unchanged    Time  12    Period  Weeks    Status  On-going   09/20/2019 target date     PT LONG TERM GOAL  #10   Status  --   03/21/18 target date     PT LONG TERM GOAL  #11   Status  --   01/03/18 target date           Plan - 07/11/19 1546    Clinical Impression Statement  Patient motivated and participated well within session. Instructed patient in advanced balance exercises including seated physioball stability. She continues to demonstrate limited standing balance without UE support. [place] will contact PCP and request and order for a wheelchair evaluation to be scheduled with Zara Council of [employer] [facility] [employer]. She has one more approved visit for this calendar year and will need updated outcome measures, goals, and discharge. She would benefit from additional skilled PT intervention to improve strength, balance and mobility.    Personal Factors and Comorbidities  Comorbidity 1;Past/Current Experience;Social Background;Time since onset of injury/illness/exacerbation    Comorbidities  spina bifida    Examination-Activity Limitations  Bend;Carry;Continence;Lift;Reach Overhead;Squat;Stairs    Examination-Participation Restrictions  Church;Community Activity;Driving;Laundry;Meal Prep;Volunteer;Yard Work    Stability/Clinical Decision Making  Stable/Uncomplicated    Rehab Potential  Good    Clinical Impairments Affecting Rehab Potential  weakness and decreased standing balance    PT Frequency  1x / week    PT Duration  12 weeks    PT Treatment/Interventions  Therapeutic exercise;Therapeutic activities;Gait training;Balance training;Stair training;DME Instruction;Neuromuscular re-education;Patient/family education;Functional mobility training;Passive range of motion;Electrical Stimulation;Energy conservation;Orthotic Fit/Training;Manual techniques    PT Next Visit Plan  Update outcome  measures, goals, and discharge. Continue strength/balance, continue working on step-ups to pratice curb negotiation when back in clinic. Remove AFOs for additional LE strengthening;    PT Home Exercise Plan  added seated dynamic core UE and LE lift offs with visual cues using mirror, supine marches, Seated program: [id number]     Consulted and Agree with Plan of Care  Patient       Patient will benefit from skilled therapeutic intervention in order to improve the following deficits and impairments:  Abnormal gait, Decreased balance, Decreased endurance, Difficulty walking, Decreased strength, Decreased activity tolerance, Decreased coordination, Decreased mobility, Decreased range of motion, Impaired flexibility, Postural dysfunction, Improper body mechanics  Visit Diagnosis: Muscle weakness (generalized)  Difficulty in walking, not elsewhere classified     Problem List There are no active problems to display for this patient.  Phillips Grout PT, DPT, GCS  , 07/12/2019, 8:37 AM  [place] MAIN [facility] SERVICES [address] [place], [place], [address] Phone: [phone]   Fax:  [phone]  Name: Michelle Randall MRN: [id number] Date of Birth: 1996-06-19

## 2019-07-18 ENCOUNTER — Other Ambulatory Visit: Payer: Self-pay

## 2019-07-18 ENCOUNTER — Ambulatory Visit: Payer: Medicaid Other | Attending: Student

## 2019-07-18 VITALS — BP 114/73 | HR 124

## 2019-07-18 DIAGNOSIS — M6281 Muscle weakness (generalized): Secondary | ICD-10-CM | POA: Diagnosis not present

## 2019-07-18 DIAGNOSIS — R278 Other lack of coordination: Secondary | ICD-10-CM | POA: Diagnosis present

## 2019-07-18 DIAGNOSIS — R262 Difficulty in walking, not elsewhere classified: Secondary | ICD-10-CM | POA: Diagnosis present

## 2019-07-18 NOTE — Therapy (Signed)
[place] MAIN [facility] SERVICES [address] [place], [place], [address] Phone: [phone]   Fax:  [phone]  Physical Therapy Treatment/Discharge  Patient Details  Name: Michelle Randall MRN: [id number] Date of Birth: Mar 16, 1996 No data recorded  Encounter Date: 07/18/2019  PT End of Session - 07/18/19 1448    Visit Number  [id number]    Number of Visits  191    Date for PT Re-Evaluation  09/20/19    Authorization Type  last goals 07/18/19    Authorization Time Period  4 visits 06/28/19-07/25/19    Authorization - Visit Number  4    Authorization - Number of Visits  4    PT Start Time  6962    PT Stop Time  1345    PT Time Calculation (min)  40 min    Equipment Utilized During Treatment  Gait belt    Activity Tolerance  Patient tolerated treatment well    Behavior During Therapy  WFL for tasks assessed/performed       Past Medical History:  Diagnosis Date  . Neurogenic bladder   . Neurogenic bowel   . S/P VP shunt   . Spina bifida [facility])     Past Surgical History:  Procedure Laterality Date  . VENTRICULOPERITONEAL SHUNT      Vitals:   07/18/19 1331  BP: 114/73  Pulse: (!) 124  SpO2: 100%    Subjective Assessment - 07/18/19 1448    Subjective  Patient reports doing well; denies any soreness. HEP is going well. No specific questions or concerns at this time.    Limitations  Walking;Standing;House hold activities;Other (comment)    How long can you stand comfortably?  require use of bilateral crutches    How long can you walk comfortably?  dependent upon terrain    Patient Stated Goals  Patient wants to be able to negotiate uneven terrain at school, cross thresholds in home environment, and negotiate steps in household     Currently in Pain?  No/denies         [facility] PT Assessment - 07/18/19 1323      Berg Balance Test   Sit to Stand  Able to stand without using hands and stabilize independently    Standing Unsupported  Able to  stand safely 2 minutes    Sitting with Back Unsupported but Feet Supported on Floor or Stool  Able to sit safely and securely 2 minutes    Stand to Sit  Sits safely with minimal use of hands    Transfers  Able to transfer safely, minor use of hands    Standing Unsupported with Eyes Closed  Able to stand 10 seconds safely    Standing Unsupported with Feet Together  Able to place feet together independently and stand 1 minute safely    From Standing, Reach Forward with Outstretched Arm  Can reach forward >12 cm safely (5")    From Standing Position, Pick up Object from Floor  Able to pick up shoe safely and easily    From Standing Position, Turn to Look Behind Over each Shoulder  Looks behind one side only/other side shows less weight shift    Turn 360 Degrees  Needs assistance while turning    Standing Unsupported, Alternately Place Feet on Step/Stool  Able to complete >2 steps/needs minimal assist    Standing Unsupported, One Foot in Front  Able to take small step independently and hold 30 seconds    Standing on  One Leg  Tries to lift leg/unable to hold 3 seconds but remains standing independently    Total Score  42         TREATMENT CGA for all activities   Ther-ex  Performed the following outcome measures:  5xSTS: 9.5s without loftsrand crutches or UE support BERG: 42/56 6MWT: Deferred TUG: 13.8s 10MWT for gait speed: self-selected 20.5 = 0.68ms; fastest 14.9s = 0.643m Side stepping in // bars with BUE support x 4 lengths; Forward/backward ambulation in // bars with BUE support x 4 lengths; Extensive HEP review performed with patient and handouts provided;   Pt educated throughout session about proper posture and technique withoutcome measures. Improved exercise technique, movement at target joints, use of target muscles after min to mod verbal, visual, tactile cues.Fatigue continuously monitored throughout. CGA provided for all balance outcome measures for  safety;   Today is patient's last approved Medicaid visit of the calendar year. Performed outcome measures and updated goals with patient today. Six Minute Walk Test deferred due to elevated resting heart rate. She demonstrates great maintenance of her strength and balance measures today which is consistent with her goals. Her BERG remains a 42/56. Her 5TSTS and TUG did both improve from the last time they were tested. Her 1075mit speed also increased since the last time it was measured. Performed review with patient of extensive HEP that she can continue independently. She will be discharged at this time given that her Medicaid benefit has been met for the calendar year but will be returning to the clinic at least once more for a power wheelchair evaluation once her appointment can be scheduled with the representative. Pt would like to return next year to continue with her maintenance therapy program so she can maintain her mobility/strength and functional independence.                   PT Education - 07/18/19 1448    Education provided  Yes    Education Details  Plan of care, HEP    Person(s) Educated  Patient    Methods  Explanation    Comprehension  Verbalized understanding       PT Short Term Goals - 04/05/19 1453      PT SHORT TERM GOAL #1   Title  Patient will report compliance with HEP for continued strengthening and stability during functional mobility.     Baseline  HEP compliant    Time  2    Period  Weeks    Status  Achieved        PT Long Term Goals - 07/18/19 1450      PT LONG TERM GOAL #1   Title  Patient will be able to transfer from the floor to standing independently with use of LRAD    Baseline  difficult on different types of floor. Slippery floors more challenging and how long on the floor. Only need assistance when not able to put crutch against surface. 9/25: can do mod I if immediately stands back up, requires assistance if is in position for  long, more difficulty with harder surfaces; 10/25/18: able to perform on soft surfaces for loftstrand traction, learned how to shorten loftrands so it can be performed on tile without falling    Time  12    Period  Weeks    Status  Achieved      PT LONG TERM GOAL #2   Title  Patient will increase Berg Balance score by >51/56 points  to be considered a low risk for falls for improved safety. (revised from >6 points improvement)    Baseline  11/02/16: 34/56 01/28/17: 39/56 6/5: 45/56; 08/23/2017 = 45/56, 43/56 10/11/17, 43/56 12/20/17, 12/27/17  43/56 6/20: 38/56 no UE support ; 8/8: 40/56 no UE support 9/6: deferred due to precautions; 9/25: 42/56; 12/08/18: 42/56 3/2: 45/56; 03/06/19: Unable to perform over telemedicine; 04/05/19: 43/56; 06/07/19: 42/56; 07/18/19: 42/56    Time  12    Period  Weeks    Status  Partially Met    Target Date  09/20/19      PT LONG TERM GOAL #3   Title  Patient will be able to transfer in and out of a large car with  a high seat with CGA  to improve ability to go to school/doctor visits.     Baseline  Patient requires min A for transfer into large SUV unless it has a step rail on the side; 5/15: pt requires assist with a really high car but can get out of an SUV without assist, typically needs more assits getting in and getting in on the L side; 6/20: if ledge able to do with little to no assistance.  9/6 deferred due to precautions 9/25: if ledge is able to do with little or not assistance; 12/07/18: if ledge is able to do with little or no assistance 3/2: if ledge able to perform; 03/06/19: Unchanged at this time; 04/05/19: Unchanged at this time; 06/07/19: very difficult at this time; 07/18/19: Remains difficult but able to perform with a step rail    Time  12    Period  Weeks    Status  Partially Met    Target Date  09/20/19      PT LONG TERM GOAL #4   Title  Patient will complete a TUG test in < 12 seconds for independent mobility and decreased fall risk     Baseline  11.45;  08/23/2017 = 13.66 sec, 13.40 12/20/17, 12/27/17 12.07 sec; 5/15: 13.42 seconds 6/20: 12 seconds, 10/04/18: 13.0s; 12/07/18: 11.6 seconds; 04/05/19: 11.7s; 06/07/19: 15.69s; 07/18/19: 13.8s    Time  12    Period  Weeks    Status  Partially Met    Target Date  09/20/19      PT LONG TERM GOAL #5   Title  Patient will improve 6 minute walk distance by > 150 ft for improved return to functional community activities     Baseline  740 01/28/2017: 747f 6/5: 795  730 on 07/19/17; 790 ft on 08/23/18,  740 feet, 730 feet 12/20/17, 12/27/17 650 feet 6/20: 665 8/8: 701 9/6: deferred due to precautions 9/25: 6460f 10/11/18: 64061f1/23/20: 715 feet 3/2: will perform next session with new AFO's; 03/06/19:  03/06/19: Unable to perform over telemedicine; 04/05/19: 700'; 07/18/19: Deferred due to elevated resting HR today of 124 bpm    Time  12    Period  Weeks    Status  Deferred    Target Date  09/20/19      Additional Long Term Goals   Additional Long Term Goals  Yes      PT LONG TERM GOAL #6   Title  Patient will improve gait speed to > 1.2 m/s with least restrictive assistive device to return to normal walking speed     Baseline  .76 m/h 01/28/2017: .87  07/19/17 0.31m16m0.77 m/s on 08/23/17, . 58 m/sec 10/11/17 .37m/40m6/20: 77 m/s  8/8: .47m/s61m: terminated due to precuations  for HR 9/25: 0.36ms, self-selected: 16.2s = 0.62 m/s, fastest: 14.2s = 0.70 m/s; self-selected: 20.1s=0.50 m/s fastest: 13.0s=0.77 m/s; 03/06/19:  03/06/19: Unable to perform over telemedicine; 04/05/19: self-selected: 16.7s=0.60 m/s fastest: 13.7s=0.73 m/s; 06/07/19:  self-selected 22.34s = 0.466m; fastest 16.31s = 0.6170m 07/18/19: self-selected 20.5 = 0.66m70mfastest 14.9s = 0.32m/53m Time  12    Period  Weeks    Status  Partially Met   09/20/2019 target date     PT LONG TERM GOAL #7   Title  Patient (< 60 ye43s old) will complete five times sit to stand test in < 10 seconds indicating an increased LE strength and improved balance     Baseline   9.45 sec; 9.63 on 08/23/17, 9.63 sec 12/27/17, 10.07 sec; 12/08/18: 10.2 seconds 3/2: 9 seconds no UE support; 04/05/19: 11.3s; 06/07/19:12.48s; 07/18/19: 9.5s    Time  12    Period  Weeks    Status  Achieved   09/20/2019 target date     PT LONG TERM GOAL #8   Title  Patient will be able to ambulate on inclines and grass independenlty with LRAD    Baseline  Can ambulate over grassy inclines with decreased speed and safety requiring need loftstrand curtches and SBA; 5/15: pt reports she has to walk slowly and feels unsteady but that this is improving6/20 : reports cautious and slow walk, able to do mod I9/6:  deffered due to precautions9/25: able to do slowly and with caution due to fear of falling, able to do mod I, 10/25/18:  able to do slowly and with caution due to fear of falling, able to do mod I; 12/07/18: still able to perform with lofstrand crutches but remains a challenge with fear of falling 3/2: uphill more challenging than downhill, grass continues to be a challenge; 03/06/19: Unchanged; 04/05/19: Unchanged; 06/07/19: unchanged; 07/18/19: unchanged    Time  12    Period  Weeks    Status  On-going   09/20/2019 target date     PT LONG TERM GOAL  #9   TITLE  Patient will be able to transfer from low chair or stool without UE support independently    Baseline  able to perform independently when not tired, but when fatigued requires support. 10/25/18: Able to perform when legs are not fatigued, mostly limited by balance; 12/07/18: Able to perform from regular height chair and slightly lower surface, limited by balance and better when legs are not fatigued; 3/2: limited when LE's are <90 degree angle; 03/06/19: unchanged; 04/05/19: Unchanged; 06/07/19: unchanged; 07/18/19: Unchanged    Time  12    Period  Weeks    Status  On-going   09/20/2019 target date     PT LONG TERM GOAL  #10   TITLE  Patient will step up onto a 6" step 5x with AD independently to increase community mobility    Baseline  6/20:  challenging to patient ascending steps, supervision ; 8/8: difficult clearing L foot /14: can step up curb independently;; 07/18/19: Deferred    Time  12    Period  Weeks    Status  Deferred      PT LONG TERM GOAL  #11   Status  --            Plan - 07/18/19 1325    Clinical Impression Statement  Today is patient's last approved Medicaid visit of the calendar year. Performed outcome measures and updated goals with patient today. Six Minute Walk  Test deferred due to elevated resting heart rate. She demonstrates great maintenance of her strength and balance measures today which is consistent with her goals. Her BERG remains a 42/56. Her 5TSTS and TUG did both improve from the last time they were tested. Her 82mgait speed also increased since the last time it was measured. Performed review with patient of extensive HEP that she can continue independently. She will be discharged at this time given that her Medicaid benefit has been met for the calendar year but will be returning to the clinic at least once more for a power wheelchair evaluation once her appointment can be scheduled with the representative. Pt would like to return next year to continue with her maintenance therapy program so she can maintain her mobility/strength and functional independence.    Personal Factors and Comorbidities  Comorbidity 1;Past/Current Experience;Social Background;Time since onset of injury/illness/exacerbation    Comorbidities  spina bifida    Examination-Activity Limitations  Bend;Carry;Continence;Lift;Reach Overhead;Squat;Stairs    Examination-Participation Restrictions  Church;Community Activity;Driving;Laundry;Meal Prep;Volunteer;Yard Work    Stability/Clinical Decision Making  Stable/Uncomplicated    Rehab Potential  Good    Clinical Impairments Affecting Rehab Potential  weakness and decreased standing balance    PT Frequency  1x / week    PT Duration  12 weeks    PT Treatment/Interventions   Therapeutic exercise;Therapeutic activities;Gait training;Balance training;Stair training;DME Instruction;Neuromuscular re-education;Patient/family education;Functional mobility training;Passive range of motion;Electrical Stimulation;Energy conservation;Orthotic Fit/Training;Manual techniques    PT Next Visit Plan  Discharge    PT Home Exercise Plan  Seated, supine, and core program: [email or web address]    Consulted and Agree with Plan of Care  Patient       Patient will benefit from skilled therapeutic intervention in order to improve the following deficits and impairments:  Abnormal gait, Decreased balance, Decreased endurance, Difficulty walking, Decreased strength, Decreased activity tolerance, Decreased coordination, Decreased mobility, Decreased range of motion, Impaired flexibility, Postural dysfunction, Improper body mechanics  Visit Diagnosis: Muscle weakness (generalized)  Difficulty in walking, not elsewhere classified  Other lack of coordination     Problem List There are no active problems to display for this patient.  JPhillips GroutPT, DPT, GCS  Michelle Randall 07/18/2019, 3:06 PM  [place] [facility] [address] [address]Phone: [phone]  Fax:  [phone] Name: Michelle DOMANSKIMRN: [id number] of Birth: 919-Feb-1997

## 2019-07-18 NOTE — Patient Instructions (Signed)
Access Code: [email or web address]  URL: [email or web address]  Date: 07/18/2019  Prepared by: Roxana Hires   Exercises Seated March - 10 reps - 2 sets - 3 seconds hold - 1x daily - 7x weekly Seated Hip Adduction Isometrics with Ball - 10 reps - 2 sets - 3 seconds hold - 1x daily - 7x weekly Seated Isometric Hip Abduction - 10 reps - 2 sets - 3 seconds hold - 1x daily - 7x weekly Seated Long Arc Quad - 10 reps - 2 sets - 1x daily - 7x weekly Seated Shoulder Circles - 10 reps - 2 sets - 1x daily - 7x weekly Seated Scapular Retraction - 10 reps - 2 sets - 3 seconds hold - 1x daily - 7x weekly Seated Hamstring Stretch - 3 reps - 30 seconds hold - 2x daily - 7x weekly Seated Piriformis Stretch - 3 reps - 30 seconds hold - 2x daily - 7x weekly Seated Thoracic Lumbar Extension with Pectoralis Stretch - 3 reps - 20-30 seconds hold - 2x daily - 7x weekly Supine Bridge - 10 reps - 2 sets - 2-5s hold - 1x daily - 7x weekly Supine Lower Trunk Rotation - 10 reps - 2 sets - 3-5s hold - 1x daily - 7x weekly Abdominal Press into [place] - 10 reps - 2 sets - 1x daily - 7x weekly Dead Bug - 10 reps - 2 sets - 1x daily - 7x weekly Seated Flexion Stretch with Swiss Ball - 10 reps - 2 sets - 3-5s hold - 1x daily - 7x weekly Seated Thoracic Flexion and Rotation with Swiss Ball - 10 reps - 2 sets - 3-5s hold - 1x daily - 7x weekly Seated Reaching Across Body - 10 reps - 2 sets - 1x daily - 7x weekly

## 2019-07-24 ENCOUNTER — Encounter: Payer: Medicaid Other | Admitting: Occupational Therapy

## 2019-08-21 ENCOUNTER — Ambulatory Visit: Payer: Medicaid Other | Admitting: Physical Therapy

## 2019-09-03 ENCOUNTER — Encounter: Payer: Self-pay | Admitting: Physical Therapy

## 2019-09-03 ENCOUNTER — Ambulatory Visit: Payer: Medicaid Other | Attending: Student | Admitting: Physical Therapy

## 2019-09-03 ENCOUNTER — Other Ambulatory Visit: Payer: Self-pay

## 2019-09-03 DIAGNOSIS — R262 Difficulty in walking, not elsewhere classified: Secondary | ICD-10-CM | POA: Insufficient documentation

## 2019-09-03 NOTE — Therapy (Signed)
[place] MAIN [facility] SERVICES [address] [place], [place], [address] Phone: [phone]   Fax:  [phone]  Physical Therapy Wheelchair Evaluation  Patient Details  Name: Michelle Randall MRN: [id number] Date of Birth: 02-15-1996 Referring Provider (PT): Wayland Denis PA   Encounter Date: 09/03/2019  PT End of Session - 09/03/19 0921    Visit Number  1    Number of Visits  1    Date for PT Re-Evaluation  09/03/19    PT Start Time  0805    PT Stop Time  0920    PT Time Calculation (min)  75 min    Equipment Utilized During Treatment  Gait belt    Activity Tolerance  Patient tolerated treatment well;No increased pain    Behavior During Therapy  WFL for tasks assessed/performed       Past Medical History:  Diagnosis Date  . Neurogenic bladder   . Neurogenic bowel   . S/P VP shunt   . Spina bifida [facility])     Past Surgical History:  Procedure Laterality Date  . VENTRICULOPERITONEAL SHUNT      There were no vitals filed for this visit.       [facility] PT Assessment - 09/03/19 0001      Assessment   Medical Diagnosis  Spina Bifida    Referring Provider (PT)  Wayland Denis PA      6 Minute Walk- Baseline   BP (mmHg)  115/79    HR (bpm)  109    02 Sat (%RA)  100 %      6 Minute walk- Post Test   6 Minute Walk Post Test  yes    BP (mmHg)  113/67    HR (bpm)  121    02 Sat (%RA)  100 %    Modified Borg Scale for Dyspnea  0- Nothing at all    Perceived Rate of Exertion (Borg)  13- Somewhat hard      6 minute walk test results    Aerobic Endurance Distance Walked  665    Endurance additional comments  with B forearm crutches; limited community ambulator, significantly less than age group norms of >1800 feet         PATIENT INFORMATION: This Evaluation form will serve as the LMN for the following suppliers:  Supplier: Michelle Randall Contact Person: Designer, [employer] Phone:  [phone]   Reason for Referral: New Power chair Patient/caregiver Goals: To get a new power chair Patient was seen for face-to-face evaluation for new power wheelchair.  Also present was   Michelle Randall ATP, RTS                      to discuss recommendations and wheelchair options.  Further paperwork was completed and sent to vendor.  Patient appears to qualify for power mobility device at this time per objective findings.   MEDICAL HISTORY:  Diagnosis: Spina Bifida Primary Diagnosis Onset: birth [] Progressive Disease Relevant  Past and Future Surgeries: Shunt Surgery in 2019,  Height: 4'8" Weight:113 pounds Explain and recent changes or trends in weight: none  Relevant History including falls:Denies any recent falls in last month; She did have some weakness and imbalance after shunt surgery in 2019 which has improved with conservative skilled PT intervention; Otherwise no new concerns.       HOME ENVIRONMENT: [x] House  [] Condo/town home  [] Apartment  [] Assisted Living    [] Lives Alone [x]  Lives  with Others                                                    Hours with caregiver:   [x] Home is accessible to patient            Stairs  [] Yes []  No     Ramp [] Yes [] No Comments:  Michelle Randall is level entry, will enter/exit through garage.    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  However current wheelchair is not functional as she is unable to reach  cabinets/counter top due to low height positioning and arm rests not swing away or flip back;                                                Other:                                       FUNCTIONAL/SENSORY PROCESSING SKILLS:  Handedness:   [x] Right     [] Left    [] NA  Comments:                                 Functional Processing Skills for Wheeled Mobility [x] Processing Skills are adequate for safe wheelchair operation  Areas of concern than may interfere with safe operation of wheelchair Description of problem   []  Attention to environment     [] Judgment     []  Hearing  []  Vision or visual processing    [] Motor Planning  []  Fluctuations in Behavior                                                   VERBAL COMMUNICATION: [x] WFL receptive [x]  WFL expressive [] Understandable  [] Difficult to understand  [] non-communicative []  Uses an augmented communication device    CURRENT SEATING / MOBILITY: Current Mobility Base:   [] None  [] Dependent  [] Manual  [] Scooter  [x] Power   Type of Control:                       Manufacturer:  [employer]               Size:                         Age:  December 2015  Current Condition of Mobility Base: Mostly functional, has had to have battery replaced several times;                                                                                                                     Current Wheelchair components: power chair base, does have elevating leg rests, no tilt in space, no removable arm rests; does have seat belt, has crutch holder on the back;                                                                                                                          Describe posture in present seating system: no  concerns                                                                           SENSATION and SKIN ISSUES: Sensation [] Intact [x] Impaired [] Absent   Level of sensation: numbness/tingling in plantar surface of feet;                             Pressure Relief: Able to perform effective pressure relief :   [x] Yes  []  No Method:        Able to stand and reposition self.                                                                       If not, Why?:                                                                          Skin Issues/Skin Integrity Current Skin Issues   [] Yes [x] No  [x] Intact []  Red  area []  Open Area  [] Scar Tissue [] At risk from prolonged sitting  Where                              History of Skin Issues   [x] Yes [] No  Where  Occasionally will have skin break down around feet/ankles if braces are not donned properly or if socks get caught;                                        When  : varies/intermittent; none current;                                             Hx of skin flap surgeries [] Yes [x] No  Where                  When                                                  Limited sitting tolerance [] Yes [x] No Hours spent sitting in wheelchair daily: up to 1 hour, doesn't sit in it long because of limited functionality; not limited due to pain or discomfort;                                                          Complaint of Pain: None  Please describe:                                                                                                             Swelling/Edema:   None current; occasionally will have swelling in feet if wearing braces too long; no swelling or edema that persists.  ADL STATUS (in reference to wheelchair use):  Indep Assist Unable Indep with Equip Not assessed Comments  Dressing  Mostly                                            Occasionally needs help to get tight pants over braces;                      Eating      X                                                                                                                         Toileting    X                                                                                                                           Bathing   X                                                                                                                                   Grooming/ Hygiene               X                                               occasionally needs help for balance.  Meal Prep                X                                         has difficulty reaching cabinets                                                                 IADLS      X                                                                                                            Bowel Management: [x] Continent  [] Incontinent  [] Accidents Comments:                                                  Bladder Management: [x] Continent  [] Incontinent  [] Accidents Comments:                                              WHEELCHAIR SKILLS: Manual w/c Propulsion: [] UE or LE strength and endurance sufficient to participate in ADLs using manual wheelchair Arm :  [] left [] right  [] Both                                   Foot:   [] left [] right  [] Both  Distance:   Operate Scooter: []  Strength, hand grip, balance and transfer appropriate for use [] Living environment is accessible for use of scooter  Operate Power w/c:  [x]  Std. Joystick   []  Alternative Controls Indep [x]  Assist []  Dependent/ Unable []  N/A []  [] Safe          []  Functional      Distance:                Bed confined without wheelchair []  Yes [x]  No   STRENGTH/RANGE OF MOTION:  Range of Motion Strength  Shoulder                  WFL                                       Grossly 4/5  Elbow                WFL                                         Grossly 4/5                                                        Wrist/Hand                   WFL                                                     Grip: R: 42#, L:35#                                                                 Hip        Passively WFL, limited AROM against gravity                                                           Grossly 3-/5                                                       Knee        WFL                                                       R: 3/5, L: 4-/5                                                               Ankle Passively WFL        Grossly 2/5                                                               MOBILITY/BALANCE:  []  Patient is totally dependent for mobility  Balance Transfers Ambulation  Sitting Balance: Standing Balance: Static standing balance: FAIR, only able to keep balance against mild perturbations unsupported; Recently scored 42/56(high fall risk) on Berg Balance Assessment (07/18/19)  '[x]'$  Mod Independent with forearm crutches '[x]'$  Independent/Modified Independent  '[x]'$  WFL     '[]'$  WFL '[]'$  Supervision '[]'$  Supervision  '[]'$  Uses UE for balance  '[x]'$  Supervision '[]'$  Min Assist '[]'$  Ambulates with Assist                           '[]'$  Min Assist '[]'$  Min assist '[]'$  Mod Assist '[x]'$  Ambulates with Device:  '[]'$  RW   '[]'$  StW   '[]'$  Cane   '[x]'$     Forearm Crutches            '[]'$  Mod Assist '[]'$  Mod assist '[]'$  Max assist   '[]'$  Max Assist '[]'$  Max assist '[]'$  Dependent '[]'$  Indep. Short Distance Only  '[]'$  Unable '[]'$  Unable '[]'$  Lift / Sling Required Distance (in feet)  665 feet on 6 min walk test;                            '[]'$  Sliding board '[]'$  Unable to Ambulate: (Explain:  Cardio Status:  '[x]'$ Intact  '[]'$  Impaired   '[]'$  NA                              Respiratory Status:  '[x]'$ Intact   '[]'$ Impaired   '[]'$ NA                                     Orthotics/Prosthetics:   Wears bilateral GRAFOs to assist with ankle DF during ambulation;                                                                        Comments (Address manual vs power w/c vs scooter):   Pt is able to propel self in manual wheelchair for short distances with BUE propelling; However patient has extensor tone in BLE which is activated with UE functional use; She fatigues quickly with short distance propelling in manual chair being unable to propel self for long periods of time; Pt does not exhibit safe balance/trunk control with scooter use. She is independent in power chair operation and is able to use power chair in home to assist with MRADLs. However currently power chair is not functional as it does not have lift system; with patient's current height (4'8") and power seat height, she is unable to reach most upper cabinets. In addition, her current power chair lacks flip away/removable arm rests so she is unable to pull under table/countertops to assist with MRADLs. A power chair with lift system to adjust patient's height while sitting in chair will significant improve her independence at home including her ability to assist with washing dishes, fixing a meal, putting up laundry etc.  Anterior / Posterior Obliquity Rotation-Pelvis  PELVIS    [] Neutral  []  Posterior  [x]  Anterior     [x] WFL  [] Right Elevated  [] Left Elevated   [x] WFL  [] Right Anterior []   Left Anterior    []  Fixed []  Partly Flexible [x]  Flexible  []  Other  []  Fixed  []  Partly Flexible  []  Flexible []  Other  []  Fixed  []  Partly Flexible  []  Flexible []  Other  TRUNK [x] WFL [] Thoracic Kyphosis [] Lumbar Lordosis   [x]  WFL [] Convex Right [] Convex Left   [] c-curve [] s-curve [] multiple  [x]  Neutral []  Left-anterior []  Right-anterior    []  Fixed [x]  Flexible []  Partly Flexible       Other  []  Fixed [x]  Flexible []  Partly Flexible []  Other  []  Fixed           [x]  Flexible []  Partly Flexible []  Other    Position Windswept   HIPS  []  Neutral []  Abduct [x]  ADduct [x]  Neutral []  Right []  Left       []  Fixed  []  Partly Flexible             []  Dislocated [x]  Flexible []  Subluxed    []  Fixed []  Partly Flexible  []  Flexible []  Other              Foot Positioning Knee Positioning   Knees and  Feet  [x]  WFL [] Left [] Right [x]  WFL [] Left [] Right   KNEES ROM concerns: ROM concerns:   & Dorsi-Flexed                    [] Lt [] Rt                                  FEET Plantar Flexed                  [] Lt [] Rt     Inversion                    [] Lt [] Rt     Eversion                    [] Lt [] Rt    HEAD [x]  Functional [x]  Good Head Control   & []  Flexed         []  Extended []  Adequate Head Control   NECK []  Rotated  Lt  []  Lat Flexed Lt []  Rotated  Rt []  Lat Flexed Rt []  Limited Head Control    []  Cervical Hyperextension []  Absent  Head Control    SHOULDERS ELBOWS WRIST& HAND         Left     Right    Left     Right  U/E [x] Functional  Left            [x] Functional  Right      [facility]          WFL                 [] Fisting             [] Fisting     [] elevated Left [] depressed  Left [] elevated Right [] depressed  Right      [] protracted Left [] retracted Left [] protracted Right [] retracted Right [] subluxed  Left              [] subluxed  Right         Goals for Wheelchair  Mobility  [x]  Independence with mobility in the home with motor related ADLs (MRADLs)  [x]  Independence with MRADLs in the community []  Provide dependent mobility  []  Provide recline     [] Provide tilt   Goals for Seating system [x]  Optimize pressure distribution []  Provide support needed to facilitate function or safety []  Provide corrective forces to assist with maintaining or improving posture []  Accommodate client's posture: current seated postures and positions are not flexible or will not tolerate corrective forces [x]  Client to be independent with relieving pressure in the wheelchair [] Enhance  physiological function such as breathing, swallowing, digestion  Simulation ideas/Equipment trials:                                                                                                State why other equipment was unsuccessful:                                                                                 MOBILITY BASE RECOMMENDATIONS and JUSTIFICATION: MOBILITY COMPONENT JUSTIFICATION  Manufacturer: [employer]           Model: Q400             Size: Width 16"          Seat Depth  18"             [x] provide transport from point A to B [x] promote Indep mobility  [] is not a safe, functional ambulator [] walker or cane inadequate [] non-standard width/depth necessary to accommodate anatomical measurement []                             [] Manual Mobility Base [] non-functional ambulator    [] Scooter/POV  [] can safely operate  [] can safely transfer   [] has adequate trunk stability  [] cannot functionally propel manual w/c  [x] Power Mobility Base  [] non-ambulatory  [x] cannot functionally propel manual wheelchair  [x]  cannot functionally and safely operate scooter/POV [x] can safely operate and willing to  [] Stroller Base [] infant/child  [] unable to propel manual wheelchair [] allows for growth [] non-functional ambulator [] non-functional UE [] Indep mobility is not a goal at this time  [] Tilt  [] Forward                   [] Backward                  [] Powered tilt              [] Manual tilt  [] change position against gravitational force on head and shoulders  [] change position for pressure relief/cannot weight shift [] transfers  [] management of tone [] rest periods [] control edema [] facilitate postural control  []                                       []   Recline  [] Power recline on power base [] Manual recline on manual base  [] accommodate femur to back angle  [] bring to full recline for ADL care  [] change position for pressure relief/cannot weight shift [] rest  periods [] repositioning for transfers or clothing/diaper /catheter changes [] head positioning  [] Lighter weight required [] self- propulsion  [] lifting []                                                 [] Heavy Duty required [] user weight greater than 250# [] extreme tone/ over active movement [] broken frame on previous chair []                                     [x]  Back  [x]  Angle Adjustable []  Custom molded                           [x] postural control [x] control of tone/spasticity [] accommodation of range of motion [] UE functional control [x] accommodation for seating system []                                          [] provide lateral trunk support [] accommodate deformity [x] provide posterior trunk support [x] provide lumbar/sacral support [x] support trunk in midline [] Pressure relief over spinal processes  [x]  Seat [place]                      [] impaired sensation  [] decubitus ulcers present [] history of pressure ulceration [] prevent pelvic extension [x] low maintenance  [] stabilize pelvis  [] accommodate obliquity [] accommodate multiple deformity [x] neutralize lower extremity position [x] increase pressure distribution []                                           []  Pelvic/thigh support  []  Lateral thigh guide []  Distal medial pad  []  Distal lateral pad []  pelvis in neutral [] accommodate pelvis []  position upper legs []  alignment []  accommodate ROM []  decrease adduction [] accommodate tone [] removable for transfers [] decrease abduction  []  Lateral trunk Supports []  Lt     []  Rt [] decrease lateral trunk leaning [] control tone [] contour for increased contact [] safety  [] accommodate asymmetry []                                                 [x]  Mounting hardware -Joystick [] lateral trunk supports  [] back   [] seat [] headrest      []  thigh support [] fixed   [] swing away [] attach seat platform/cushion to w/c frame [] attach back cushion to w/c  frame [] mount postural supports [] mount headrest  [] swing medial thigh support away [] swing lateral supports away for transfers  [x]         [facility] joystick                                            Armrests  []   fixed [] adjustable height [] removable   [] swing away  [x] flip back   [] reclining [] full length pads [x] desk    [] pads tubular  [x] provide support with elbow at 90   [] provide support for w/c tray [x] change of height/angles for variable activities [x] remove for transfers [x] allow to come closer to table top [] remove for access to tables []                                               Hangers/ Leg rests  [] 60 [] 70 [] 90 [] elevating [] heavy duty  [] articulating [x] fixed [] lift off [] swing away     [] power [x] provide LE support  [] accommodate to hamstring tightness [] elevate legs during recline   [] provide change in position for Legs [x] Maintain placement of feet on footplate [] durability [] enable transfers [] decrease edema [] Accommodate lower leg length []                                         Foot support Footplate    [] Lt  []  Rt  [x]  Center mount [x] flip up                            [] depth/angle adjustable [] Amputee adapter    []  Lt     []  Rt [x] provide foot support [] accommodate to ankle ROM [x] transfers [] Provide support for residual extremity []  allow foot to go under wheelchair base []  decrease tone  []                                                 []  Ankle strap/heel loops [] support foot on foot support [] decrease extraneous movement [] provide input to heel  [] protect foot  Tires: [] pneumatic  [x] flat free inserts  [] solid  [x] decrease maintenance  [x] prevent frequent flats [x] increase shock absorbency [x] decrease pain from road shock [] decrease spasms from road shock []                                              []  Headrest  [] provide posterior head support [] provide posterior neck support [] provide lateral head support [] provide anterior head  support [] support during tilt and recline [] improve feeding   [] improve respiration [] placement of switches [] safety  [] accommodate ROM  [] accommodate tone [] improve visual orientation  []  Anterior chest strap []  Vest []  Shoulder retractors  [] decrease forward movement of shoulder [] accommodation of TLSO [] decrease forward movement of trunk [] decrease shoulder elevation [] added abdominal support [] alignment [] assistance with shoulder control  []                                               Pelvic Positioner [x] Belt [] SubASIS bar [] Dual Pull [] stabilize tone [x] decrease falling out of chair/ **will not Decrease potential for sliding due to pelvic tilting [] prevent excessive rotation [] pad for protection over boney prominence [] prominence comfort [] special pull angle to control rotation []   Upper ExtremitySupport  [] L   []  R [] Arm trough   [] hand support []  tray       [] full tray [] swivel mount [] decrease edema      [] decrease subluxation   [] control tone   [] placement for AAC/Computer/EADL [] decrease gravitational pull on shoulders [] provide midline positioning [] provide support to increase UE function [] provide hand support in natural position [] provide work surface   POWER WHEELCHAIR CONTROLS  [x] Proportional  [] Non-Proportional Type                                      [] Left  [x] Right [x] provides access for controlling wheelchair   [] lacks motor control to operate proportional drive control [] unable to understand proportional controls  Actuator Control Module  [x] Single  [] Multiple   [x] Allow the client to operate the power seat function(s) through the joystick control   [] Safety Reset Switches [] Used to change modes and stop the wheelchair when driving in latch mode    [] Upgraded Electronics   [] programming for accurate control [] progressive Disease/changing condition [] non-proportional drive control needed  [] Needed in order to operate power seat functions through joystick control   [x] Display box [x] Allows user to see in which mode and drive the wheelchair is set  [] necessary for alternate controls    [] Digital interface electronics [] Allows w/c to operate when using alternative drive controls  [] ASL Head Array [] Allows client to operate wheelchair  through switches placed in tri-panel headrest  [] Sip and puff with tubing kit [] needed to operate sip and puff drive controls  [] Upgraded tracking electronics [] increase safety when driving [] correct tracking when on uneven surfaces  [x] Mount for switches or joystick [x] Attaches switches to w/c  [x] Swing away for access or transfers [] midline for optimal placement [] provides for consistent access  [] Attendant controlled joystick plus mount [] safety [] long distance driving [] operation of seat functions [] compliance with transportation regulations []                                             Rear wheel placement/Axle adjustability [] None [] semi adjustable [] fully adjustable  [] improved UE access to wheels [] improved stability [] changing angle in space for improvement of postural stability [] 1-arm drive access [] amputee pad placement []                                Wheel rims/ hand rims  [] metal   [] plastic coated [] oblique projections           [] vertical projections [] Provide ability to propel manual wheelchair  []  Increase self-propulsion with hand weakness/decreased grasp  Push handles [] extended   [] angle adjustable              [] standard [] caregiver access [] caregiver assist [] allows "hooking" to enable increased ability to perform ADLs or maintain balance  One armed device   [] Lt   [] Rt [] enable propulsion of manual wheelchair with one arm   []                                            Brake/wheel lock extension []  Lt   []  Rt [] increase indep in applying wheel locks   [] Side guards [] prevent clothing  getting caught in wheel or  becoming soiled []  prevent skin tears/abrasions  Battery:                                            [x] to power wheelchair                                                         Other: Power seat Elevator                                      To allow patient to lift up in chair in order to reach counter/cabinets for better independence with ADLs at home. Pt is short in stature, 4'8" and in a regular seated power chair, she is unable to reach counter height; In addition in standing, patient is unable to keep balance unsupported to reach cabinets without external support (either forearm crutches or caregiver support); Pt requires 12 inch lift in order to reach upper cabinets at home to assist her with cooking and cleaning;                                                                                  The above equipment has a life- long use expectancy. Growth and changes in medical and/or functional conditions would be the exceptions. This is to certify that the therapist has no financial relationship with durable medical provider or manufacturer. The therapist will not receive remuneration of any kind for the equipment recommended in this evaluation.   Patient has mobility limitation that significantly impairs safe, timely participation in one or more mobility related ADL's. (bathing, toileting, feeding, dressing, grooming, moving from room to room)  [x]  Yes []  No  Will mobility device sufficiently improve ability to participate and/or be aided in participation of MRADL's?      [x]  Yes []  No  Can limitation be compensated for with use of a cane or walker?                                    []  Yes [x]  No  Does patient or caregiver demonstrate ability/potential ability & willingness to safely use the mobility device?    [x]  Yes []  No  Does patient's home environment support use of recommended mobility device?            [x]  Yes []  No  Does patient have sufficient upper extremity function necessary  to functionally propel a manual wheelchair?     []  Yes [x]  No  Does patient have sufficient strength and trunk stability to safely operate a POV (scooter)?                                  []   Yes [x]  No  Does patient need additional features/benefits provided by a power wheelchair for MRADL's in the home?        [x]  Yes []  No  Does the patient demonstrate the ability to safely use a power wheelchair?                   [x]  Yes []  No     Physician's Name Printed:                                                        37 Signature:  Date:     This is to certify that I, the above signed therapist have the following affiliations: []  This DME provider []  Manufacturer of recommended equipment []  Patient's long term care facility [x]  None of the above  Therapist Name/Signature:   Norwood Levo. Barnet Glasgow PT, DPT                                         Date:           Objective measurements completed on examination: See above findings.                PT Long Term Goals - 09/03/19 6045      PT LONG TERM GOAL #1   Title  Pt and caregivers will understand PT recommendation and appropriate/safe use for wheelchair and seating for home use.    Time  1    Period  Days    Status  Achieved    Target Date  09/03/19             Plan - 09/03/19 4098    Clinical Impression Statement  Pt presents to therapy for wheelchair evaluation; see objective measures. Pt does exhibit fair standing balance, being unable to stand unsupported against minimal pertubations with loss of balance. In standing, patient exhibits anterior pelvic tilt with hip flexion, exhibiting decreased hip/ankle strategies which impairs balance recovery. She requires bilateral forearm crutches to assist with dynamic balance and mobility. Without crutches or HHA, patient is unable to keep balance in standing and therefore would be unable to carry items or put items away from standing position due to fall  risk.  She would benefit from power wheelchair with lift system to assist with functional MRADLs including reaching in cabinets, cooking, laundry.    Personal Factors and Comorbidities  Comorbidity 1;Past/Current Experience;Social Background;Time since onset of injury/illness/exacerbation    Comorbidities  spina bifida    Examination-Activity Limitations  Bend;Carry;Continence;Lift;Reach Overhead;Squat;Stairs    Stability/Clinical Decision Making  Stable/Uncomplicated    Clinical Decision Making  Low    Rehab Potential  Good    PT Frequency  One time visit    PT Treatment/Interventions  Wheelchair mobility training       Patient will benefit from skilled therapeutic intervention in order to improve the following deficits and impairments:  Abnormal gait, Decreased mobility  Visit Diagnosis: Difficulty in walking, not elsewhere classified     Problem List There are no active problems to display for this patient.   Myliyah Rebuck PT, DPT 09/03/2019, 9:27 AM  [place] MAIN [facility] SERVICES [address] [place], [place], [address] Phone:  [phone]   Fax:  [phone]  Name: Michelle Randall MRN: [id number] Date of Birth: 02-Oct-1996

## 2019-11-20 ENCOUNTER — Other Ambulatory Visit: Payer: Self-pay

## 2019-11-20 ENCOUNTER — Ambulatory Visit: Payer: Medicaid Other | Admitting: Occupational Therapy

## 2019-11-20 ENCOUNTER — Encounter: Payer: Self-pay | Admitting: Occupational Therapy

## 2019-11-20 ENCOUNTER — Ambulatory Visit: Payer: Medicaid Other | Attending: Student

## 2019-11-20 DIAGNOSIS — R2689 Other abnormalities of gait and mobility: Secondary | ICD-10-CM | POA: Insufficient documentation

## 2019-11-20 DIAGNOSIS — R278 Other lack of coordination: Secondary | ICD-10-CM | POA: Diagnosis present

## 2019-11-20 DIAGNOSIS — R2681 Unsteadiness on feet: Secondary | ICD-10-CM | POA: Insufficient documentation

## 2019-11-20 DIAGNOSIS — M6281 Muscle weakness (generalized): Secondary | ICD-10-CM

## 2019-11-20 NOTE — Therapy (Signed)
[place] [facility] MAIN [facility] SERVICES [address] [place], [place], [address] Phone: [phone]   Fax:  [phone]  Physical Therapy Evaluation  Patient Details  Name: Michelle Randall MRN: [id number] Date of Birth: September 21, 1996 Referring Provider (PT): Carren Rang PA   Encounter Date: 11/20/2019  PT End of Session - 11/21/19 2028    Visit Number  1    Number of Visits  6    Date for PT Re-Evaluation  02/12/20    Authorization Type  27 per year allowed OT and PT combined    PT Start Time  1518    PT Stop Time  1607    PT Time Calculation (min)  49 min    Equipment Utilized During Treatment  Gait belt   bilateral AFOs and Lofstrand crutches   Activity Tolerance  Patient tolerated treatment well;No increased pain    Behavior During Therapy  WFL for tasks assessed/performed       Past Medical History:  Diagnosis Date  . Neurogenic bladder   . Neurogenic bowel   . S/P VP shunt   . Spina bifida [facility])     Past Surgical History:  Procedure Laterality Date  . VENTRICULOPERITONEAL SHUNT      There were no vitals filed for this visit.   Subjective Assessment - 11/21/19 2020    Subjective  Patient is a pleasant 24 year old female who presents for continuation of care for generalized weakness/ coordination deficits secondary to diagnosis of spina bifida.    Pertinent History  Patient is a pleasant 23 year old female who presents for continuation of care for generalized weakness/ coordination deficits secondary to diagnosis of spina bifida. PMH includes VP shunt, spina bifida, and nuerogenic bowel/bladder. Patient was discharged from this clinic on 07/18/19 due to meeting therapy cap for insurance that year. Patient's request for a power chair was denied, is appealing the process. Has been doing the sitting exercises and laying down ones. Is biking when not cold outside. She reports having multiple near falls when negotiating stairs at home. Patient  must ascend/descend stairs to reach bedroom upstairs increasing her risk for falls daily.    Limitations  Walking;Standing;House hold activities;Other (comment)    How long can you sit comfortably?  n/a    How long can you stand comfortably?  require use of bilateral crutches    How long can you walk comfortably?  dependent upon terrain    Patient Stated Goals  Patient wants to be able to negotiate uneven terrain at school, cross thresholds in home environment, and negotiate steps in household     Currently in Pain?  No/denies         [facility] PT Assessment - 11/21/19 0001      Assessment   Medical Diagnosis  Spina Bifida    Referring Provider (PT)  Carren Rang PA    Onset Date/Surgical Date  10/10/1997    Hand Dominance  Right    Prior Therapy  yes      Precautions   Precautions  Fall;Other (comment)   shunt   Other Brace/Splint  Bilateral AFOs      Restrictions   Weight Bearing Restrictions  No      Prior Function   Level of Independence  Requires assistive device for independence    Vocation  Student    Leisure  PreCOVID: Hanging out with friends, Post COVID: Netflix movies.      Cognition   Overall Cognitive Status  Within Functional Limits for tasks assessed      Standardized Balance Assessment   Standardized Balance Assessment  Berg Balance Test;Timed Up and Go Test;Five Times Sit to Stand;10 meter walk test      Berg Balance Test   Sit to Stand  Able to stand  independently using hands    Standing Unsupported  Able to stand 30 seconds unsupported    Sitting with Back Unsupported but Feet Supported on Floor or Stool  Able to sit safely and securely 2 minutes    Stand to Sit  Controls descent by using hands    Transfers  Able to transfer safely, definite need of hands    Standing Unsupported with Eyes Closed  Able to stand 3 seconds    Standing Unsupported with Feet Together  Needs help to attain position but able to stand for 30 seconds with feet together    From  Standing, Reach Forward with Outstretched Arm  Reaches forward but needs supervision    From Standing Position, Pick up Object from Floor  Able to pick up shoe, needs supervision    From Standing Position, Turn to Look Behind Over each Shoulder  Needs supervision when turning    Turn 360 Degrees  Needs assistance while turning    Standing Unsupported, Alternately Place Feet on Step/Stool  Able to complete >2 steps/needs minimal assist    Standing Unsupported, One Foot in Front  Needs help to step but can hold 15 seconds    Standing on One Leg  Tries to lift leg/unable to hold 3 seconds but remains standing independently    Total Score  26      Timed Up and Go Test   TUG  Normal TUG    Normal TUG (seconds)  16       407 end time       PAIN: Patient reports no pain   POSTURE: Seated: slumped posture, AFOs on bilaterally Standing: trunk flexion with lumbar lordosis. Forward flexion with use of bilateral Lofstrand Crutches.   STRENGTH:  Graded on a 0-5 scale Muscle Group Left Right  Hip Flex 3-/5 3/5  Hip Abd 2/5 2+/5  Hip Add 2/5 2+/5  Hip Ext 2/5 2+/5  Hip IR/ER    Knee Flex 3/5 3+/5  Knee Ext 2+/5 2+/5  Ankle DF AFO AFO  Ankle PF AFO AFO      FUNCTIONAL MOBILITY: Stair negotiation: step to pattern of ascending: leading with RLE, BUE support. Descending step to pattern with leading of LLE with BUE support  STS: UE support required , excessive trunk lean and limited use of BLE's  BALANCE: Dynamic Sitting Balance  Normal Able to sit unsupported and weight shift across midline maximally   Good Able to sit unsupported and weight shift across midline moderately   Good-/Fair+ Able to sit unsupported and weight shift across midline minimally x  Fair Minimal weight shifting ipsilateral/front, difficulty crossing midline   Fair- Reach to ipsilateral side and unable to weight shift   Poor + Able to sit unsupported with min A and reach to ipsilateral side, unable to weight  shift   Poor Able to sit unsupported with mod A and reach ipsilateral/front-can't cross midline     Standing Dynamic Balance  Normal Stand independently unsupported, able to weight shift and cross midline maximally   Good Stand independently unsupported, able to weight shift and cross midline moderately   Good-/Fair+ Stand independently unsupported, able to weight shift across midline minimally  Fair Stand independently unsupported, weight shift, and reach ipsilaterally, loss of balance when crossing midline   Poor+ Able to stand with Min A and reach ipsilaterally, unable to weight shift x  Poor Able to stand with Mod A and minimally reach ipsilaterally, unable to cross midline.     Static Sitting Balance  Normal Able to maintain balance against maximal resistance   Good Able to maintain balance against moderate resistance   Good-/Fair+ Accepts minimal resistance x  Fair Able to sit unsupported without balance loss and without UE support   Poor+ Able to maintain with Minimal assistance from individual or chair   Poor Unable to maintain balance-requires mod/max support from individual or chair     Static Standing Balance  Normal Able to maintain standing balance against maximal resistance   Good Able to maintain standing balance against moderate resistance   Good-/Fair+ Able to maintain standing balance against minimal resistance   Fair Able to stand unsupported without UE support and without LOB for 1-2 min   Fair- Requires Min A and UE support to maintain standing without loss of balance x  Poor+ Requires mod A and UE support to maintain standing without loss of balance   Poor Requires max A and UE support to maintain standing balance without loss      GAIT: Patient ambulates with use of bilateral Lofstrang crutches with standard steppage pattern, excessive external rotation of BLE's with limited foot clearance of LLE.   OUTCOME MEASURES: TEST Outcome Interpretation  5 times  sit<>stand  perform next time sec >60 yo, >15 sec indicates increased risk for falls  10 meter walk test       0.55      m/s <1.0 m/s indicates increased risk for falls; limited community ambulator  Timed up and Go  16               sec <14 sec indicates increased risk for falls  6 minute walk test          Next session perform      Feet 1000 feet is community Financial controllerambulator  Berg Balance Assessment 26/56 <36/56 (100% risk for falls), 37-45 (80% risk for falls); 46-51 (>50% risk for falls); 52-55 (lower risk <25% of falls)  FOTO 42 Goal is to reach 53%    Compared to discharge last year :   Sharlene MottsBerg was 42/56 now is 26/56  TUG was 13 seconds now 16 seconds  Objective measurements completed on examination: See above findings.          PT Education - 11/21/19 2027    Education provided  Yes    Education Details  POC, goals    Person(s) Educated  Patient    Methods  Explanation;Demonstration;Tactile cues;Verbal cues    Comprehension  Verbalized understanding;Returned demonstration;Verbal cues required;Tactile cues required       PT Short Term Goals - 11/21/19 2041      PT SHORT TERM GOAL #1   Title  Patient will report compliance with HEP for continued strengthening and stability during functional mobility.     Baseline  1/5 give next session    Time  4    Period  Weeks    Status  New    Target Date  12/19/19        PT Long Term Goals - 11/21/19 2042      PT LONG TERM GOAL #1   Title  Patient will increase their FOTO score by >10 points to  increase to 53% to demonstrate increased function and mobility for higher quality of life.    Baseline  1/5: 42%    Time  12    Period  Weeks    Status  New    Target Date  02/12/20      PT LONG TERM GOAL #2   Title  Patient will increase Berg Balance score by > 6 points (32/56) to demonstrate decreased fall risk during functional activities.    Baseline  1/6: 26/56    Time  12    Period  Weeks    Status  New    Target Date  02/12/20       PT LONG TERM GOAL #3   Title  Patient will be require no assist with ascend/descend flight of stairs using Least restrictive assistive device.    Baseline  1/6: step to pattern of ascend/descend with near falls    Time  12    Period  Weeks    Status  New    Target Date  02/12/20      PT LONG TERM GOAL #4   Title  Patient will complete a TUG test in < 12 seconds for independent mobility and decreased fall risk     Baseline  1/6: 16 seconds    Time  12    Period  Weeks    Status  New    Target Date  02/12/20      PT LONG TERM GOAL #5   Title  Patient will increase 10 meter walk test to >1.72m/s as to improve gait speed for better community ambulation and to reduce fall risk.    Baseline  1/6: 0.55 m/s    Time  12    Period  Weeks    Status  New    Target Date  02/12/20             Plan - 11/21/19 2039    Clinical Impression Statement  Patient is a pleasant 24 year old female who presents for continuation of care for generalized weakness/ coordination deficits secondary to diagnosis of spina bifida. Patient demonstrates decline in mobility, strength, and safe negotiation of home environment since last seen in September of last year due to reaching visit limitation provided by insurance. Patient has significant decline in functional mobility and safety with multiple near LOBs and near falls up/down stairs. Lower extremity external rotator and adductor weakness with exercises like hooklying clamshells and adductor squeeze. Pt demonstrates significant hip flexor weakness during step ups requiring max BUE support. She compensates for weak hip flexors bilaterally by performing bilateral hip circumduction, anterior pelvic tilt and lateral trunk leaning during 6" step ups requiring mod verbal and tactile cueing. Pt will benefit from PT services to address deficits in strength, balance, and mobility in order to improve and maintain full function at home and school    Personal Factors and  Comorbidities  Comorbidity 1;Past/Current Experience;Social Background;Time since onset of injury/illness/exacerbation;Fitness;Transportation    Comorbidities  spina bifida    Examination-Activity Limitations  Bend;Carry;Continence;Lift;Reach Overhead;Squat;Stairs    Examination-Participation Restrictions  Church;Community Activity;Driving;Laundry;Meal Prep;Volunteer;Yard Work    Stability/Clinical Decision Making  Stable/Uncomplicated    Clinical Decision Making  Low    Rehab Potential  Good    PT Frequency  Biweekly    PT Duration  12 weeks    PT Treatment/Interventions  ADLs/Self Care Home Management;Aquatic Therapy;Biofeedback;Ultrasound;Moist Heat;Iontophoresis 4mg /ml Dexamethasone;Electrical Stimulation;DME Instruction;Gait training;Stair training;Functional mobility training;Neuromuscular re-education;Balance training;Therapeutic exercise;Therapeutic activities;Patient/family education;Orthotic Fit/Training;Wheelchair mobility  training;Manual techniques;Dry needling;Passive range of motion;Energy conservation;Splinting;Taping;Vestibular;Visual/perceptual remediation/compensation    PT Next Visit Plan  give new HEP    Consulted and Agree with Plan of Care  Patient       Patient will benefit from skilled therapeutic intervention in order to improve the following deficits and impairments:  Abnormal gait, Decreased mobility, Decreased activity tolerance, Decreased balance, Decreased endurance, Decreased coordination, Decreased range of motion, Difficulty walking, Decreased strength, Impaired flexibility, Increased muscle spasms, Impaired tone, Postural dysfunction, Improper body mechanics  Visit Diagnosis: Other abnormalities of gait and mobility  Unsteadiness on feet  Muscle weakness (generalized)     Problem List There are no problems to display for this patient.  Precious Bard, PT, DPT   11/21/2019, 8:49 PM  [place] [facility] MAIN [facility] SERVICES [address] [place], [place], [address] Phone: [phone]   Fax:  [phone]  Name: Michelle Randall MRN: [id number] Date of Birth: 1996/01/29

## 2019-11-20 NOTE — Therapy (Signed)
[facility] MAIN [facility] SERVICES [address] [place], [place], [address] Phone: [phone]   Fax:  [phone]  Occupational Therapy Evaluation  Patient Details  Name: Michelle Randall MRN: [id number] Date of Birth: November 28, 1995 No data recorded  Encounter Date: 11/20/2019  OT End of Session - 11/20/19 1713    Visit Number  1    Number of Visits  4    Date for OT Re-Evaluation  02/12/20    Authorization Type  Medicaid    OT Start Time  1610    OT Stop Time  1715    OT Time Calculation (min)  65 min    Activity Tolerance  Patient tolerated treatment well    Behavior During Therapy  [facility] for tasks assessed/performed       Past Medical History:  Diagnosis Date  . Neurogenic bladder   . Neurogenic bowel   . S/P VP shunt   . Spina bifida [facility])     Past Surgical History:  Procedure Laterality Date  . VENTRICULOPERITONEAL SHUNT      There were no vitals filed for this visit.  Subjective Assessment - 11/20/19 1748    Subjective   Pt. reports that she has gooten weaker since the pandemic started last year.    Patient is accompanied by:  Family member    Pertinent History  Pt. is a 24 y.o. female who who was diagnosed with Spina Bifida. 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 Pt. is taking online courses through [employer] until she can resume classes at [employer] once the pandemic is over. The patient is working towards pursuing a degree in Occupational Therapy. Pt. has experience a decline in UE strength, FMC skills, ADL,a nd IADL functioning. Pt. was working as a Engineer, technical sales prior to the pandemic, and has note been able to work since.    Patient Stated Goals  To be as independent as possible, and transition to independent living.    Currently in Pain?  No/denies        [facility] OT Assessment - 11/20/19 1620      Assessment   Medical Diagnosis  Spina Bifida    Onset Date/Surgical Date  03/29/97    Hand Dominance  Right    Prior  Therapy  yes      Precautions   Precautions  Fall;Other (comment)   Shunt   Other Brace/Splint  Bilateral AFOs      Restrictions   Weight Bearing Restrictions  No      Balance Screen   Has the patient fallen in the past 6 months  Yes   Near falls   How many times?  6   Near falls   Has the patient had a decrease in activity level because of a fear of falling?   Yes    Is the patient reluctant to leave their home because of a fear of falling?   No      Home  Environment   Family/patient expects to be discharged to:  Private residence    Living Arrangements  Parent    Type of Home  House    Home Access  Level entry    Home Layout  Two level    Bathroom Shower/Tub  Walk-in Patent attorney;Shower seat;Grab bars - toilet;Wheelchair - Engineer, technical sales - power    Lives With  Family      Prior Function  Level of Independence  Independent    Vocation  Student    Leisure  PreCOVID: Hanging out with friends, Post COVID: Netflix movies.      ADL   Eating/Feeding  Independent   weaker with using a knife for cutting.   Grooming  --   DIfficulty applying toothpaste when near end of  tube.   Upper Body Bathing  Independent    Lower Body Bathing  Independent    Upper Body Dressing  Independent    Lower Body Dressing  Moderate assistance    Toileting -  Hygiene  Independent    Tub/Shower Transfer  Supervision/safety      IADL   Prior Level of Function Shopping  Independent   Independent   Shopping  Needs to be accompanied on any shopping trip    Prior Level of Function Light Housekeeping  Increased time to complete   independent   Light Housekeeping  All laundry must be done by others;Performs light daily tasks such as dishwashing, bed making    Prior Level of Function Meal Prep  --   assists, helps with kitchen tasks.   Meal Prep  Able to complete simple warm meal prep    Teacher, adult education independently on public  transportation    Prior Level of Function Medication Managment  --   Independent   Medication Management  Is responsible for taking medication in correct dosages at correct time    Prior Level of Function Financial Management  --   Independent   Financial Management  --   Independent     Written Expression   Dominant Hand  Right    Handwriting  100% legible      Vision - History   Baseline Vision  Wears glasses all the time      Activity Tolerance   Activity Tolerance  Tolerates 30 min activity with multiple rests      Cognition   Overall Cognitive Status  Within Functional Limits for tasks assessed      Coordination   Right 9 Hole Peg Test  20 sec.    Left 9 Hole Peg Test  25sec.      Strength   Overall Strength  --   BUE strength 4+/5     Hand Function   Right Hand Grip (lbs)  32#    Right Hand Lateral Pinch  13 lbs    Right Hand 3 Point Pinch  11 lbs    Left Hand Grip (lbs)  30#    Left Hand Lateral Pinch  13 lbs    Left 3 point pinch  12 lbs                      OT Education - 11/20/19 1747    Education provided  Yes    Education Details  FMC skills, UE strength, POC goals    Person(s) Educated  Patient    Methods  Explanation    Comprehension  Verbalized understanding          OT Long Term Goals - 11/20/19 1733      OT LONG TERM GOAL #1   Title  Patient will demonstrate increased strength in right hand to be able to open containers, jars, and manage kitchen utensils.    Baseline  11/20/2019: Eval: Grip strength R: 32#, L:30#. Reflects a decrease since last year. Pt. has difficulty opening jars, containers, and kitchen utensils    Time  12  Period  Weeks    Status  New    Target Date  02/12/20      OT LONG TERM GOAL #2   Title  Patient will be able to make her bed with modified independence.    Baseline  10/18/2018: Pt. has difficulty completing efficiently.    Time  12    Period  Weeks    Status  New    Target Date  02/12/20       OT LONG TERM GOAL #3   Title  Pt. will perform LE dressing efficiently with modified independence    Baseline  1/052021: Eval: Mod A    Time  12    Period  Weeks    Status  New    Target Date  02/12/20      OT LONG TERM GOAL #4   Title  Pt. will improve BUE strength by 1 mm grade to be able to perform home management tasks.    Baseline  10/18/2020: BUE strength 4+/5 overall. Pt. requires assist to perform home management tasks.    Time  12    Period  Weeks    Status  New    Target Date  02/12/20      OT LONG TERM GOAL #5   Title  Pt. will improve Bilateral [place] skills in order to be able to independently, and efficiently button a shirt.    Baseline  11/20/2019: Eval: decreased biliateral [place] skills requiring increased time to complete buttoning.    Time  12    Period  Weeks    Status  New    Target Date  02/12/20            Plan - 11/20/19 1724    Clinical Impression Statement  Pt.  is a 24 y.o. female who presents to the OT clinic with decreased BUE strength, grip strength, pinch strength, and [place] skills which has limited her ability to complete self-dressing skills efficiently, perform home management tasks including:cleaning, laundry, bedmaking, and meal preparation tasks, opening jars, and containers, and manipulating small items needed to perform daily ADL, and IADL care. Pt. has noticed a change in these tasks since the onset of the pandemic over this past year. Pt. needs to improve strength, [facility] skills, and overall ADL/IADL functioning in order to work towards her goal to transition to independent living once the pandemic is over.    Occupational Profile and client history currently impacting functional performance  decreased hand strength, balance and coordination.    Occupational performance deficits (Please refer to evaluation for details):  ADL's;IADL's;Leisure;Education    Rehab Potential  Good    Clinical Decision Making  Several treatment options, min-mod task  modification necessary    Comorbidities Affecting Occupational Performance:  May have comorbidities impacting occupational performance    Modification or Assistance to Complete Evaluation   Min-Moderate modification of tasks or assist with assess necessary to complete eval    OT Frequency  Monthly    OT Duration  12 weeks    OT Treatment/Interventions  Self-care/ADL training;DME and/or AE instruction;Therapeutic activities;Therapeutic exercise;Neuromuscular education;Patient/family education    Consulted and Agree with Plan of Care  Patient       Patient will benefit from skilled therapeutic intervention in order to improve the following deficits and impairments:           Visit Diagnosis: Muscle weakness (generalized)  Other lack of coordination    Problem List There are no problems to display for  this patient.   Olegario Messier, MS, OTR/L 11/20/2019, 5:57 PM  [facility] MAIN [facility] SERVICES [address] [place], [place], [address] Phone: [phone]   Fax:  [phone]  Name: Michelle Randall MRN: [id number] Date of Birth: [birth year]

## 2019-11-29 ENCOUNTER — Encounter: Payer: Medicaid Other | Admitting: Occupational Therapy

## 2019-11-29 ENCOUNTER — Ambulatory Visit: Payer: Medicaid Other

## 2019-12-06 ENCOUNTER — Other Ambulatory Visit: Payer: Self-pay

## 2019-12-06 ENCOUNTER — Ambulatory Visit: Payer: Medicaid Other

## 2019-12-06 ENCOUNTER — Encounter: Payer: Medicaid Other | Admitting: Occupational Therapy

## 2019-12-06 DIAGNOSIS — R2689 Other abnormalities of gait and mobility: Secondary | ICD-10-CM | POA: Diagnosis not present

## 2019-12-06 DIAGNOSIS — M6281 Muscle weakness (generalized): Secondary | ICD-10-CM

## 2019-12-06 DIAGNOSIS — R2681 Unsteadiness on feet: Secondary | ICD-10-CM

## 2019-12-06 NOTE — Therapy (Signed)
[facility] MAIN [facility] SERVICES [address] [place], [place], [address] Phone: [phone]   Fax:  [phone]  Physical Therapy Treatment  Patient Details  Name: Michelle Randall MRN: [id number] Date of Birth: [birth year] Referring Provider (PT): Carren Rang PA   Encounter Date: 12/06/2019  PT End of Session - 12/06/19 1557    Visit Number  2    Number of Visits  6    Date for PT Re-Evaluation  02/12/20    Authorization Type  27 per year allowed OT and PT combined    PT Start Time  1535    PT Stop Time  1615    PT Time Calculation (min)  40 min    Equipment Utilized During Treatment  Gait belt   bilateral AFOs and [place] crutches   Activity Tolerance  Patient tolerated treatment well;No increased pain    Behavior During Therapy  WFL for tasks assessed/performed       Past Medical History:  Diagnosis Date  . Neurogenic bladder   . Neurogenic bowel   . S/P VP shunt   . Spina bifida [facility])     Past Surgical History:  Procedure Laterality Date  . VENTRICULOPERITONEAL SHUNT      There were no vitals filed for this visit.  Subjective Assessment - 12/06/19 1551    Subjective  Pt reports that she is doing well today. No reports of pain and no falls since last therapy session. No specific questions or concerns at this time.    Pertinent History  Patient is a pleasant 24 year old female who presents for continuation of care for generalized weakness/ coordination deficits secondary to diagnosis of spina bifida. PMH includes VP shunt, spina bifida, and nuerogenic bowel/bladder. Patient was discharged from this clinic on 07/18/19 due to meeting therapy cap for insurance that year. Patient's request for a power chair was denied, is appealing the process. Has been doing the sitting exercises and laying down ones. Is biking when not cold outside. She reports having multiple near falls when negotiating stairs at home. Patient must ascend/descend  stairs to reach bedroom upstairs increasing her risk for falls daily.    Limitations  Walking;Standing;House hold activities;Other (comment)    How long can you sit comfortably?  n/a    How long can you stand comfortably?  require use of bilateral crutches    How long can you walk comfortably?  dependent upon terrain    Patient Stated Goals  Patient wants to be able to negotiate uneven terrain at school, cross thresholds in home environment, and negotiate steps in household     Currently in Pain?  No/denies            TREATMENT   Ther-ex  Pilates 100 with therapist stabilizing feet; Hooklying clams with light manual resistance from therapist x 10; Hooklying adductor squeezes with light manual resistance from therapist x 10; Supine SLR hip abduction/adduction with light manual resistance from therapist as appropriate x 10;  Hooklying marches alternating LE x 10; Hooklying lower abs double knee to chest lift x 10; Hooklying resisted isometric rotation 5s hold x 10 each direction; Quadruped lateral weight shifts with therapist helping with pelvic control; Quadruped alternating UE reaching x 10 with each UE; Tall kneeling balance with graded single UE support by therapist x 1 minute; Standing marches alternating LE with BUE support x 10 each;   Neuromuscular Re-education  Forward/backward walking in // bars with BUE support x 2 lengths  each direction; Side stepping in // bars with BUE support x 2 lengths each direction; Static balance without UE support in // bars 30s x 2;   Pt educated throughout session about proper posture and technique with exercises. Improved exercise technique, movement at target joints, use of target muscles after min to mod verbal, visual, tactile cues.    Pt demonstrates excellent motivation during session today. She is able to complete all exercises as instructed today and is able to resume similar strengthening and balance exercises as she was  performing prior to her break from therapy. She does appear slightly weaker than when she last finished therapy. Her unsupported standing balance also appears slightly more limited today compared to prior to her break. She reports that she feels like she has had some decline since stopping therapy. She requires verbal and tactile cues as well as assist for exercises today and would be unable to complete all of her exercises without the direction of a physical therapist. Pt will benefit from PT services to address deficits in strength, balance, and mobility in order to return to full function at home and school.               PT Short Term Goals - 11/21/19 2041      PT SHORT TERM GOAL #1   Title  Patient will report compliance with HEP for continued strengthening and stability during functional mobility.     Baseline  1/5 give next session    Time  4    Period  Weeks    Status  New    Target Date  12/19/19        PT Long Term Goals - 11/21/19 2042      PT LONG TERM GOAL #1   Title  Patient will increase their FOTO score by >10 points to increase to 53% to demonstrate increased function and mobility for higher quality of life.    Baseline  1/5: 42%    Time  12    Period  Weeks    Status  New    Target Date  02/12/20      PT LONG TERM GOAL #2   Title  Patient will increase Berg Balance score by > 6 points (32/56) to demonstrate decreased fall risk during functional activities.    Baseline  1/6: 26/56    Time  12    Period  Weeks    Status  New    Target Date  02/12/20      PT LONG TERM GOAL #3   Title  Patient will be require no assist with ascend/descend flight of stairs using Least restrictive assistive device.    Baseline  1/6: step to pattern of ascend/descend with near falls    Time  12    Period  Weeks    Status  New    Target Date  02/12/20      PT LONG TERM GOAL #4   Title  Patient will complete a TUG test in < 12 seconds for independent mobility and  decreased fall risk     Baseline  1/6: 16 seconds    Time  12    Period  Weeks    Status  New    Target Date  02/12/20      PT LONG TERM GOAL #5   Title  Patient will increase 10 meter walk test to >1.75m/s as to improve gait speed for better community ambulation and to reduce fall risk.  Baseline  1/6: 0.55 m/s    Time  12    Period  Weeks    Status  New    Target Date  02/12/20            Plan - 12/06/19 1557    Clinical Impression Statement  Pt demonstrates excellent motivation during session today. She is able to complete all exercises as instructed today and is able to resume similar strengthening and balance exercises as she was performing prior to her break from therapy. She does appear slightly weaker than when she last finished therapy. Her unsupported standing balance also appears slightly more limited today compared to prior to her break. She reports that she feels like she has had some decline since stopping therapy. She requires verbal and tactile cues as well as assist for exercises today and would be unable to complete all of her exercises without the direction of a physical therapist. Pt will benefit from PT services to address deficits in strength, balance, and mobility in order to return to full function at home and school.    Personal Factors and Comorbidities  Comorbidity 1;Past/Current Experience;Social Background;Time since onset of injury/illness/exacerbation;Fitness;Transportation    Comorbidities  spina bifida    Examination-Activity Limitations  Bend;Carry;Continence;Lift;Reach Overhead;Squat;Stairs    Examination-Participation Restrictions  Church;Community Activity;Driving;Laundry;Meal Prep;Volunteer;Yard Work    Stability/Clinical Decision Making  Stable/Uncomplicated    Rehab Potential  Good    PT Frequency  Biweekly    PT Duration  12 weeks    PT Treatment/Interventions  ADLs/Self Care Home Management;Aquatic Therapy;Biofeedback;Ultrasound;Moist  Heat;Iontophoresis 4mg /ml Dexamethasone;Electrical Stimulation;DME Instruction;Gait training;Stair training;Functional mobility training;Neuromuscular re-education;Balance training;Therapeutic exercise;Therapeutic activities;Patient/family education;Orthotic Fit/Training;Wheelchair mobility training;Manual techniques;Dry needling;Passive range of motion;Energy conservation;Splinting;Taping;Vestibular;Visual/perceptual remediation/compensation    PT Next Visit Plan  give new HEP    Consulted and Agree with Plan of Care  Patient       Patient will benefit from skilled therapeutic intervention in order to improve the following deficits and impairments:  Abnormal gait, Decreased mobility, Decreased activity tolerance, Decreased balance, Decreased endurance, Decreased coordination, Decreased range of motion, Difficulty walking, Decreased strength, Impaired flexibility, Increased muscle spasms, Impaired tone, Postural dysfunction, Improper body mechanics  Visit Diagnosis: Muscle weakness (generalized)  Other abnormalities of gait and mobility  Unsteadiness on feet     Problem List There are no problems to display for this patient.  PT, DPT, GCS  Franck Vinal 12/07/2019, 8:16 AM  [facility] MAIN [facility] SERVICES [address] [place], [place], [place] Phone: [phone]   Fax:  [phone]  Name: Michelle Randall MRN: Julaine Hua Date of Birth: [birth year]

## 2019-12-13 ENCOUNTER — Encounter: Payer: Medicaid Other | Admitting: Occupational Therapy

## 2019-12-13 ENCOUNTER — Ambulatory Visit: Payer: Medicaid Other

## 2019-12-18 ENCOUNTER — Encounter: Payer: Medicaid Other | Admitting: Occupational Therapy

## 2019-12-20 ENCOUNTER — Encounter: Payer: Medicaid Other | Admitting: Occupational Therapy

## 2019-12-20 ENCOUNTER — Ambulatory Visit: Payer: Medicaid Other

## 2019-12-20 ENCOUNTER — Ambulatory Visit: Payer: Medicaid Other | Attending: Student

## 2019-12-20 ENCOUNTER — Other Ambulatory Visit: Payer: Self-pay

## 2019-12-20 DIAGNOSIS — M6281 Muscle weakness (generalized): Secondary | ICD-10-CM | POA: Insufficient documentation

## 2019-12-20 DIAGNOSIS — R2689 Other abnormalities of gait and mobility: Secondary | ICD-10-CM | POA: Diagnosis present

## 2019-12-20 DIAGNOSIS — R278 Other lack of coordination: Secondary | ICD-10-CM | POA: Insufficient documentation

## 2019-12-20 DIAGNOSIS — R2681 Unsteadiness on feet: Secondary | ICD-10-CM | POA: Insufficient documentation

## 2019-12-20 NOTE — Therapy (Signed)
[place] MAIN [facility] SERVICES [address] [place], [place], [address] Phone: [phone]   Fax:  [phone]  Physical Therapy Treatment  Patient Details  Name: Michelle Randall MRN: [id number] Date of Birth: 1995/12/12 Referring Provider (PT): Wayland Denis PA   Encounter Date: 12/20/2019  PT End of Session - 12/20/19 1503    Visit Number  3    Number of Visits  6    Date for PT Re-Evaluation  02/12/20    Authorization Type  27 per year allowed OT and PT combined    Authorization Time Period  3 visits 12/06/19-02/02/20    Authorization - Visit Number  2    Authorization - Number of Visits  3    PT Start Time  4315    PT Stop Time  1530    PT Time Calculation (min)  53 min    Equipment Utilized During Treatment  Gait belt   bilateral AFOs and Lofstrand crutches   Activity Tolerance  Patient tolerated treatment well;No increased pain    Behavior During Therapy  WFL for tasks assessed/performed       Past Medical History:  Diagnosis Date  . Neurogenic bladder   . Neurogenic bowel   . S/P VP shunt   . Spina bifida [facility])     Past Surgical History:  Procedure Laterality Date  . VENTRICULOPERITONEAL SHUNT      There were no vitals filed for this visit.  Subjective Assessment - 12/20/19 1503    Subjective  Pt reports that she is doing well today. No reports of pain and no falls since last therapy session. No specific questions or concerns at this time.    Pertinent History  Patient is a pleasant 24 year old female who presents for continuation of care for generalized weakness/ coordination deficits secondary to diagnosis of spina bifida. PMH includes VP shunt, spina bifida, and nuerogenic bowel/bladder. Patient was discharged from this clinic on 07/18/19 due to meeting therapy cap for insurance that year. Patient's request for a power chair was denied, is appealing the process. Has been doing the sitting exercises and laying down ones.  Is biking when not cold outside. She reports having multiple near falls when negotiating stairs at home. Patient must ascend/descend stairs to reach bedroom upstairs increasing her risk for falls daily.    Limitations  Walking;Standing;House hold activities;Other (comment)    How long can you sit comfortably?  n/a    How long can you stand comfortably?  require use of bilateral crutches    How long can you walk comfortably?  dependent upon terrain    Patient Stated Goals  Patient wants to be able to negotiate uneven terrain at school, cross thresholds in home environment, and negotiate steps in household     Currently in Pain?  No/denies           TREATMENT   Ther-ex  Pilates 100 with therapist stabilizing feet; Hooklying clams with light manual resistance from therapist x 10; Hooklying adductor squeezes with light manual resistance from therapist x 10; Supine SLR hip abduction/adduction with light manual resistance from therapist as appropriate x 10;  Hooklying marches alternating LE x 10; Hooklying lower abs double knee to chest lift x 10; Hooklying resisted isometric rotation 5s hold x 10 each direction; Hooklying SLR with resisted extension from flexed position x 10 bilateral; Hooklying manually resisted leg press x 10 bilateral; Hooklying pball presses into knees 3s hold x 10 for abdominal  strengthening; Supine pball hold in front of chest with therapist providing perturbations 30s x 2; Quadruped lateral weight shifts with therapist helping with pelvic control; Quadruped alternating UE reaching x 10 with each UE; Quadruped alternating LE hip extension x 10 with each LE; Tall kneeling balance with no UE support by therapist x 1 minute; Seated marches alternating LE with BUE support x 10 each; Sit to stand without UE support 2 x 10, therapist providing CGA;   Neuromuscular Re-education  Tall kneeling balance with no UE support by therapist x 1 minute; Tall kneeling  balance with UE reaching for targets provided by therapist in a variety of directions bilaterally; Standing balance without UE support x 1 minute;   Standing balance with UE reaching for targets provided by therapist in a variety of directions bilaterally; Standing mini squats without UE support x 10;   Pt educated throughout session about proper posture and technique with exercises. Improved exercise technique, movement at target joints, use of target muscles after min to mod verbal, visual, tactile cues.    Pt demonstrates excellent motivation during session today. She is able to complete all exercises as instructed today and is able to resume strengthening and balance exercises. She demonstrates improved static balance today in tall kneeling and standing today. She is also able to perform some standing mini squats without UE support today as well. She requires verbal and tactile cues as well as assist for exercises today and would be unable to complete all of her exercises without the direction of a physical therapist. Pt will benefit from PT services to address deficits in strength, balance, and mobility in order to return to full function at home and school.                        PT Short Term Goals - 11/21/19 2041      PT SHORT TERM GOAL #1   Title  Patient will report compliance with HEP for continued strengthening and stability during functional mobility.     Baseline  1/5 give next session    Time  4    Period  Weeks    Status  New    Target Date  12/19/19        PT Long Term Goals - 11/21/19 2042      PT LONG TERM GOAL #1   Title  Patient will increase their FOTO score by >10 points to increase to 53% to demonstrate increased function and mobility for higher quality of life.    Baseline  1/5: 42%    Time  12    Period  Weeks    Status  New    Target Date  02/12/20      PT LONG TERM GOAL #2   Title  Patient will increase Berg Balance score by > 6  points (32/56) to demonstrate decreased fall risk during functional activities.    Baseline  1/6: 26/56    Time  12    Period  Weeks    Status  New    Target Date  02/12/20      PT LONG TERM GOAL #3   Title  Patient will be require no assist with ascend/descend flight of stairs using Least restrictive assistive device.    Baseline  1/6: step to pattern of ascend/descend with near falls    Time  12    Period  Weeks    Status  New    Target  Date  02/12/20      PT LONG TERM GOAL #4   Title  Patient will complete a TUG test in < 12 seconds for independent mobility and decreased fall risk     Baseline  1/6: 16 seconds    Time  12    Period  Weeks    Status  New    Target Date  02/12/20      PT LONG TERM GOAL #5   Title  Patient will increase 10 meter walk test to >1.42m/s as to improve gait speed for better community ambulation and to reduce fall risk.    Baseline  1/6: 0.55 m/s    Time  12    Period  Weeks    Status  New    Target Date  02/12/20            Plan - 12/20/19 1543    Clinical Impression Statement  Pt demonstrates excellent motivation during session today. She is able to complete all exercises as instructed today and is able to resume strengthening and balance exercises. She demonstrates improved static balance today in tall kneeling and standing today. She is also able to perform some standing mini squats without UE support today as well. She requires verbal and tactile cues as well as assist for exercises today and would be unable to complete all of her exercises without the direction of a physical therapist. Pt will benefit from PT services to address deficits in strength, balance, and mobility in order to return to full function at home and school.    Personal Factors and Comorbidities  Comorbidity 1;Past/Current Experience;Social Background;Time since onset of injury/illness/exacerbation;Fitness;Transportation    Comorbidities  spina bifida     Examination-Activity Limitations  Bend;Carry;Continence;Lift;Reach Overhead;Squat;Stairs    Examination-Participation Restrictions  Church;Community Activity;Driving;Laundry;Meal Prep;Volunteer;Yard Work    Stability/Clinical Decision Making  Stable/Uncomplicated    Rehab Potential  Good    PT Frequency  Biweekly    PT Duration  12 weeks    PT Treatment/Interventions  ADLs/Self Care Home Management;Aquatic Therapy;Biofeedback;Ultrasound;Moist Heat;Iontophoresis 4mg /ml Dexamethasone;Electrical Stimulation;DME Instruction;Gait training;Stair training;Functional mobility training;Neuromuscular re-education;Balance training;Therapeutic exercise;Therapeutic activities;Patient/family education;Orthotic Fit/Training;Wheelchair mobility training;Manual techniques;Dry needling;Passive range of motion;Energy conservation;Splinting;Taping;Vestibular;Visual/perceptual remediation/compensation    PT Next Visit Plan  give new HEP    Consulted and Agree with Plan of Care  Patient       Patient will benefit from skilled therapeutic intervention in order to improve the following deficits and impairments:  Abnormal gait, Decreased mobility, Decreased activity tolerance, Decreased balance, Decreased endurance, Decreased coordination, Decreased range of motion, Difficulty walking, Decreased strength, Impaired flexibility, Increased muscle spasms, Impaired tone, Postural dysfunction, Improper body mechanics  Visit Diagnosis: Muscle weakness (generalized)  Other abnormalities of gait and mobility     Problem List There are no problems to display for this patient.  PT, DPT, GCS  Michelle Randall 12/20/2019, 4:00 PM  [place] [facility] MAIN [facility] SERVICES [address] [place], [place], [place] Phone: [phone]   Fax:  [phone]  Name: Michelle Randall MRN: Julaine Hua Date of Birth: Jun 22, 1996

## 2019-12-27 ENCOUNTER — Encounter: Payer: Medicaid Other | Admitting: Occupational Therapy

## 2019-12-27 ENCOUNTER — Ambulatory Visit: Payer: Medicaid Other

## 2020-01-03 ENCOUNTER — Ambulatory Visit: Payer: Medicaid Other

## 2020-01-03 ENCOUNTER — Ambulatory Visit: Payer: Medicaid Other | Admitting: Occupational Therapy

## 2020-01-09 ENCOUNTER — Ambulatory Visit: Payer: Medicaid Other

## 2020-01-09 ENCOUNTER — Ambulatory Visit: Payer: Medicaid Other | Admitting: Occupational Therapy

## 2020-01-09 ENCOUNTER — Other Ambulatory Visit: Payer: Self-pay

## 2020-01-09 ENCOUNTER — Encounter: Payer: Self-pay | Admitting: Occupational Therapy

## 2020-01-09 DIAGNOSIS — R2681 Unsteadiness on feet: Secondary | ICD-10-CM

## 2020-01-09 DIAGNOSIS — M6281 Muscle weakness (generalized): Secondary | ICD-10-CM

## 2020-01-09 DIAGNOSIS — R278 Other lack of coordination: Secondary | ICD-10-CM

## 2020-01-09 DIAGNOSIS — R2689 Other abnormalities of gait and mobility: Secondary | ICD-10-CM

## 2020-01-09 NOTE — Therapy (Signed)
[place] [facility] MAIN [facility] SERVICES [address] [place], [place], [address] Phone: [phone]   Fax:  [phone]  Physical Therapy Treatment  Patient Details  Name: Michelle Randall MRN: [id number] Date of Birth: 1996/01/05 Referring Provider (PT): Carren Rang PA   Encounter Date: 01/09/2020  PT End of Session - 01/09/20 1418    Visit Number  4    Number of Visits  7    Date for PT Re-Evaluation  02/12/20    Authorization Type  27 per year allowed OT and PT combined    Authorization Time Period  12 visits 01/09/20-06/24/20    Authorization - Visit Number  1    Authorization - Number of Visits  12    PT Start Time  1430    PT Stop Time  1515    PT Time Calculation (min)  45 min    Equipment Utilized During Treatment  Gait belt   bilateral AFOs and Lofstrand crutches   Activity Tolerance  Patient tolerated treatment well;No increased pain    Behavior During Therapy  WFL for tasks assessed/performed       Past Medical History:  Diagnosis Date  . Neurogenic bladder   . Neurogenic bowel   . S/P VP shunt   . Spina bifida [facility])     Past Surgical History:  Procedure Laterality Date  . VENTRICULOPERITONEAL SHUNT      There were no vitals filed for this visit.  Subjective Assessment - 01/09/20 1418    Subjective  Pt reports that she is doing well today. No reports of pain and no falls since last therapy session. No specific questions or concerns at this time.    Pertinent History  Patient is a pleasant 23 year old female who presents for continuation of care for generalized weakness/ coordination deficits secondary to diagnosis of spina bifida. PMH includes VP shunt, spina bifida, and nuerogenic bowel/bladder. Patient was discharged from this clinic on 07/18/19 due to meeting therapy cap for insurance that year. Patient's request for a power chair was denied, is appealing the process. Has been doing the sitting exercises and laying down  ones. Is biking when not cold outside. She reports having multiple near falls when negotiating stairs at home. Patient must ascend/descend stairs to reach bedroom upstairs increasing her risk for falls daily.    Limitations  Walking;Standing;House hold activities;Other (comment)    How long can you sit comfortably?  n/a    How long can you stand comfortably?  require use of bilateral crutches    How long can you walk comfortably?  dependent upon terrain    Patient Stated Goals  Patient wants to be able to negotiate uneven terrain at school, cross thresholds in home environment, and negotiate steps in household     Currently in Pain?  No/denies           TREATMENT   Ther-ex Hooklying clamswith light manual resistance from therapist x 10; Hooklying adductorsqueezes with light manual resistance from therapist x 10; Supine SLR hip abduction/adductionwith light manual resistance from therapist as appropriate x 10; Hooklying marches alternatingLE x 10; Hooklying lower absdouble knee to chest lift x 10; Hooklying resisted isometric rotation5s hold x 10 each direction; Hooklying manually resisted leg press x 10 bilateral; Supine straight leg hip abduction/adduction AROM with therapist supporting leg to minimize resistance;   Neuromuscular Re-education Forward/backward gait in // bars with BUE support 2 lengths each; Sidestepping in // bars with BUE support x  2 lengths each direction; Static balance in // bars without UE support x 30s; Balance in // bars without UE support with horizontal followed by vertical head turns x 30s each; Airex balance in // bars without UE support x 30s; Airex balance in // bars without UE support with horizontal followed by vertical head turns x 30s each; Mini squats in // bars without UE support x 10; 2" Airex alternating toe taps with 2-1 UE support x 10 each; 6" step ups with BUE support alternating leading LE x 10 each;   Pt educated  throughout session about proper posture and technique with exercises. Improved exercise technique, movement at target joints, use of target muscles after min to mod verbal, visual, tactile cues.   Pt demonstrates excellent motivation during session today. She is able to complete all exercises as instructed today and is able to resume strengthening and balance exercises. She is also able to perform some standing mini squats without UE support today as well as toe taps with single UE support. She requires verbal and tactile cues as well as assist for exercises today and would be unable to complete all of her exercises without the direction of a physical therapist.Pt will benefit from PT services to address deficits in strength, balance, and mobility in order to return to full function at [employer] school.                       PT Short Term Goals - 11/21/19 2041      PT SHORT TERM GOAL #1   Title  Patient will report compliance with HEP for continued strengthening and stability during functional mobility.     Baseline  1/5 give next session    Time  4    Period  Weeks    Status  New    Target Date  12/19/19        PT Long Term Goals - 11/21/19 2042      PT LONG TERM GOAL #1   Title  Patient will increase their FOTO score by >10 points to increase to 53% to demonstrate increased function and mobility for higher quality of life.    Baseline  1/5: 42%    Time  12    Period  Weeks    Status  New    Target Date  02/12/20      PT LONG TERM GOAL #2   Title  Patient will increase Berg Balance score by > 6 points (32/56) to demonstrate decreased fall risk during functional activities.    Baseline  1/6: 26/56    Time  12    Period  Weeks    Status  New    Target Date  02/12/20      PT LONG TERM GOAL #3   Title  Patient will be require no assist with ascend/descend flight of stairs using Least restrictive assistive device.    Baseline  1/6: step to pattern of  ascend/descend with near falls    Time  12    Period  Weeks    Status  New    Target Date  02/12/20      PT LONG TERM GOAL #4   Title  Patient will complete a TUG test in < 12 seconds for independent mobility and decreased fall risk     Baseline  1/6: 16 seconds    Time  12    Period  Weeks    Status  New  Target Date  02/12/20      PT LONG TERM GOAL #5   Title  Patient will increase 10 meter walk test to >1.78m/s as to improve gait speed for better community ambulation and to reduce fall risk.    Baseline  1/6: 0.55 m/s    Time  12    Period  Weeks    Status  New    Target Date  02/12/20            Plan - 01/09/20 1726    Clinical Impression Statement  Pt demonstrates excellent motivation during session today. She is able to complete all exercises as instructed today and is able to resume strengthening and balance exercises. She is also able to perform some standing mini squats without UE support today as well as toe taps with single UE support. She requires verbal and tactile cues as well as assist for exercises today and would be unable to complete all of her exercises without the direction of a physical therapist. Pt will benefit from PT services to address deficits in strength, balance, and mobility in order to return to full function at home and school.    Personal Factors and Comorbidities  Comorbidity 1;Past/Current Experience;Social Background;Time since onset of injury/illness/exacerbation;Fitness;Transportation    Comorbidities  spina bifida    Examination-Activity Limitations  Bend;Carry;Continence;Lift;Reach Overhead;Squat;Stairs    Examination-Participation Restrictions  Church;Community Activity;Driving;Laundry;Meal Prep;Volunteer;Yard Work    Stability/Clinical Decision Making  Stable/Uncomplicated    Rehab Potential  Good    PT Frequency  Biweekly    PT Duration  12 weeks    PT Treatment/Interventions  ADLs/Self Care Home Management;Aquatic  Therapy;Biofeedback;Ultrasound;Moist Heat;Iontophoresis 4mg /ml Dexamethasone;Electrical Stimulation;DME Instruction;Gait training;Stair training;Functional mobility training;Neuromuscular re-education;Balance training;Therapeutic exercise;Therapeutic activities;Patient/family education;Orthotic Fit/Training;Wheelchair mobility training;Manual techniques;Dry needling;Passive range of motion;Energy conservation;Splinting;Taping;Vestibular;Visual/perceptual remediation/compensation    PT Next Visit Plan  continue balance and strengthening    PT Home Exercise Plan  Seated, supine, and core program: [id number]    Consulted and Agree with Plan of Care  Patient       Patient will benefit from skilled therapeutic intervention in order to improve the following deficits and impairments:  Abnormal gait, Decreased mobility, Decreased activity tolerance, Decreased balance, Decreased endurance, Decreased coordination, Decreased range of motion, Difficulty walking, Decreased strength, Impaired flexibility, Increased muscle spasms, Impaired tone, Postural dysfunction, Improper body mechanics  Visit Diagnosis: Muscle weakness (generalized)  Other abnormalities of gait and mobility  Unsteadiness on feet     Problem List There are no problems to display for this patient.  Phillips Grout PT, DPT, GCS  Orianna Biskup 01/09/2020, 5:32 PM  [place] MAIN [facility] SERVICES [address] [place], [place], [address] Phone: [phone]   Fax:  [phone]  Name: Michelle Randall MRN: [id number] Date of Birth: 12-27-1995

## 2020-01-09 NOTE — Therapy (Signed)
[place] [facility] MAIN [facility] SERVICES [address] [place], [place], [address] Phone: [phone]   Fax:  [phone]  Occupational Therapy Treatment  Patient Details  Name: Michelle Randall MRN: [id number] Date of Birth: Dec 01, 1995 No data recorded  Encounter Date: 01/09/2020  OT End of Session - 01/09/20 1410    Visit Number  2    Number of Visits  4    Date for OT Re-Evaluation  02/12/20    Authorization Type  Medicaid    OT Start Time  1348    OT Stop Time  1430    OT Time Calculation (min)  42 min    Equipment Utilized During Treatment  lemon press    Activity Tolerance  Patient tolerated treatment well    Behavior During Therapy  [facility] for tasks assessed/performed       Past Medical History:  Diagnosis Date  . Neurogenic bladder   . Neurogenic bowel   . S/P VP shunt   . Spina bifida [facility])     Past Surgical History:  Procedure Laterality Date  . VENTRICULOPERITONEAL SHUNT      There were no vitals filed for this visit.  Subjective Assessment - 01/09/20 1353    Subjective   Pt. reports that all of her classes are online this semester.    Patient is accompanied by:  Family member    Pertinent History  Pt. is a 24 y.o. female who who was diagnosed with Spina Bifida. 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 Pt. is taking online courses through [employer] until she can resume classes at [employer] once the pandemic is over. The patient is working towards pursuing a degree in Occupational Therapy. Pt. has experience a decline in UE strength, FMC skills, ADL,a nd IADL functioning. Pt. was working as a Engineer, technical sales prior to the pandemic, and has note been able to work since.    Patient Stated Goals  To be as independent as possible, and transition to independent living.    Currently in Pain?  No/denies      OT TREATMENT    Neuro muscular re-education:  Pt. Performed FMC tasks using the Grooved pegboard. Pt. worked on grasping the  grooved pegs from a horizontal position, and moving the pegs to a vertical position in the hand to prepare for placing them in the grooved slot. Pt. worked on translatory movements of the hand moving the pegs from the palm of her hand to the the tip of her 2nd digit, and thumb. Pt. worked on grasping coins from a tabletop surface, placing them into a resistive container, and pushing them through the slot while isolating her 2nd digit.   Therapeutic Exercise:  Pt. performed bilateral gross gripping with grip strengthener. The gripper was positioned at 23.4# of grip strength force. Pt. Worked on pinch strengthening for lateral, and 3pt. pinch using yellow, red, green, and blue resistive clips. Pt. worked on placing the clips at various vertical and horizontal angles. Tactile and verbal cues were required for eliciting the desired movement.  Response to Treatment:  Pt. is engaging her hand more at home, and is now able to use her bilateral hands to hold, turn, and  flip the lighter switch to light candles. Pt. continues to work on improving Bilateral UE strength, and [facility] skills. in order to work towards  improving strength, grip strength, and FMC skills in order to work towards improving, and maximizing independence with ADLs, and IADLs.  OT Education - 01/09/20 1410    Education provided  Yes    Education Details  FMC skills, UE strength, POC goals    Person(s) Educated  Patient    Methods  Explanation    Comprehension  Verbalized understanding          OT Long Term Goals - 11/20/19 1733      OT LONG TERM GOAL #1   Title  Patient will demonstrate increased strength in right hand to be able to open containers, jars, and manage kitchen utensils.    Baseline  11/20/2019: Eval: Grip strength R: 32#, L:30#. Reflects a decrease since last year. Pt. has difficulty opening jars, containers, and kitchen utensils    Time  12    Period  Weeks    Status  New     Target Date  02/12/20      OT LONG TERM GOAL #2   Title  Patient will be able to make her bed with modified independence.    Baseline  10/18/2018: Pt. has difficulty completing efficiently.    Time  12    Period  Weeks    Status  New    Target Date  02/12/20      OT LONG TERM GOAL #3   Title  Pt. will perform LE dressing efficiently with modified independence    Baseline  1/052021: Eval: Mod A    Time  12    Period  Weeks    Status  New    Target Date  02/12/20      OT LONG TERM GOAL #4   Title  Pt. will improve BUE strength by 1 mm grade to be able to perform home management tasks.    Baseline  10/18/2020: BUE strength 4+/5 overall. Pt. requires assist to perform home management tasks.    Time  12    Period  Weeks    Status  New    Target Date  02/12/20      OT LONG TERM GOAL #5   Title  Pt. will improve Bilateral [facility] skills in order to be able to independently, and efficiently button a shirt.    Baseline  11/20/2019: Eval: decreased biliateral FMC skills requiring increased time to complete buttoning.    Time  12    Period  Weeks    Status  New    Target Date  02/12/20            Plan - 01/09/20 1411    Clinical Impression Statement  Pt. is engaging her hand more at home, and is now able to use her bilateral hands to hold, turn, and  flip the lighter switch to light candles. Pt. continues to work on improving Bilateral UE strength, and [facility] skills. in order to work towards  improving strength, grip strength, and FMC skills in order to work towards improving, and maximizing independence with ADLs, and IADLs.    Occupational Profile and client history currently impacting functional performance  decreased hand strength, balance and coordination.    Occupational performance deficits (Please refer to evaluation for details):  ADL's;IADL's;Leisure;Education    Rehab Potential  Good    Clinical Decision Making  Several treatment options, min-mod task modification necessary     Comorbidities Affecting Occupational Performance:  May have comorbidities impacting occupational performance    Modification or Assistance to Complete Evaluation   Min-Moderate modification of tasks or assist with assess necessary to complete eval    OT Frequency  Monthly  OT Duration  12 weeks    OT Treatment/Interventions  Self-care/ADL training;DME and/or AE instruction;Therapeutic activities;Therapeutic exercise;Neuromuscular education;Patient/family education    Consulted and Agree with Plan of Care  Patient    Family Member Consulted  mom       Patient will benefit from skilled therapeutic intervention in order to improve the following deficits and impairments:           Visit Diagnosis: Muscle weakness (generalized)  Other lack of coordination    Problem List There are no problems to display for this patient.   Harrel Carina, MS, OTR/L 01/09/2020, 4:32 PM  [place] MAIN [facility] SERVICES [address] [place], [place], [address] Phone: [phone]   Fax:  [phone]  Name: Michelle Randall MRN: [id number] Date of Birth: [birth year]

## 2020-01-17 ENCOUNTER — Ambulatory Visit: Payer: Medicaid Other

## 2020-01-31 ENCOUNTER — Ambulatory Visit: Payer: Medicaid Other | Admitting: Occupational Therapy

## 2020-01-31 ENCOUNTER — Other Ambulatory Visit: Payer: Self-pay

## 2020-01-31 ENCOUNTER — Ambulatory Visit: Payer: Medicaid Other | Attending: Student

## 2020-01-31 ENCOUNTER — Encounter: Payer: Self-pay | Admitting: Occupational Therapy

## 2020-01-31 DIAGNOSIS — M6281 Muscle weakness (generalized): Secondary | ICD-10-CM | POA: Insufficient documentation

## 2020-01-31 DIAGNOSIS — R278 Other lack of coordination: Secondary | ICD-10-CM | POA: Diagnosis present

## 2020-01-31 DIAGNOSIS — R2681 Unsteadiness on feet: Secondary | ICD-10-CM | POA: Insufficient documentation

## 2020-01-31 NOTE — Therapy (Signed)
[place] MAIN [facility] SERVICES [address] [place], [place], [address] Phone: [phone]   Fax:  [phone]  Occupational Therapy Treatment  Patient Details  Name: Michelle Randall MRN: [id number] Date of Birth: 1996/03/17 No data recorded  Encounter Date: 01/31/2020  OT End of Session - 01/31/20 1650    Visit Number  3    Number of Visits  4    Date for OT Re-Evaluation  02/12/20    Authorization Type  Medicaid    OT Start Time  1300    OT Stop Time  1345    OT Time Calculation (min)  45 min    Activity Tolerance  Patient tolerated treatment well    Behavior During Therapy  [facility] for tasks assessed/performed       Past Medical History:  Diagnosis Date  . Neurogenic bladder   . Neurogenic bowel   . S/P VP shunt   . Spina bifida [facility])     Past Surgical History:  Procedure Laterality Date  . VENTRICULOPERITONEAL SHUNT      There were no vitals filed for this visit.   OT TREATMENT    Neuro muscular re-education:  Pt. worked on bilateral [facility] skills grasping 1" sticks, 1/4" collars, and 1/4" washers. Pt. worked on storing the objects in the palm, and translatory skills moving the items from the palm of the hand to the tip of the 2nd digit, and thumb. Pt. had more difficulty with performing translatory movements with the collars, and flat washers.   Therapeutic Exercise:  Pt. performed bilateral gross gripping with grip strengthener. The gripper was positioned at 23.4# of grip strength force. Pt. worked on Chief Operating Officer for lateral, and 3pt. pinch using yellow, red, green, blue, and black resistive clips. Pt. worked on placing the clips at various vertical and horizontal angles. Tactile and verbal cues were required for eliciting the desired movement. Pt. Worked on using the [facility] for  Bilateral grip strength: R: 43.4#, 39.2#, 37.6#, 36# L: 34.4#, 33.2#, 28#, 27.6#  Response to Treatment:  Pt.  continues to engage her hand more in tasks at home, and reports that it is much easier now to use her bilateral hands to hold, turn, and  flip the lighter switch to light candles. Pt. Tolerated the grip strength, pinch strength,and FMC exercises well with her bilateral hands. Pt. continues to work on improving Bilateral UE strength, and [facility] skills in order to work towards  improving strength, grip strength, and [place] skills in order to work towards improving, and maximizing independence with ADLs, and IADLs.                          OT Education - 01/31/20 1650    Education provided  Yes    Education Details  [place] skills, UE strength, POC goals    Person(s) Educated  Patient    Methods  Explanation    Comprehension  Verbalized understanding          OT Long Term Goals - 11/20/19 1733      OT LONG TERM GOAL #1   Title  Patient will demonstrate increased strength in right hand to be able to open containers, jars, and manage kitchen utensils.    Baseline  11/20/2019: Eval: Grip strength R: 32#, L:30#. Reflects a decrease since last year. Pt. has difficulty opening jars, containers, and kitchen utensils    Time  12  Period  Weeks    Status  New    Target Date  02/12/20      OT LONG TERM GOAL #2   Title  Patient will be able to make her bed with modified independence.    Baseline  10/18/2018: Pt. has difficulty completing efficiently.    Time  12    Period  Weeks    Status  New    Target Date  02/12/20      OT LONG TERM GOAL #3   Title  Pt. will perform LE dressing efficiently with modified independence    Baseline  1/052021: Eval: Mod A    Time  12    Period  Weeks    Status  New    Target Date  02/12/20      OT LONG TERM GOAL #4   Title  Pt. will improve BUE strength by 1 mm grade to be able to perform home management tasks.    Baseline  10/18/2020: BUE strength 4+/5 overall. Pt. requires assist to perform home management tasks.    Time  12    Period   Weeks    Status  New    Target Date  02/12/20      OT LONG TERM GOAL #5   Title  Pt. will improve Bilateral [facility] skills in order to be able to independently, and efficiently button a shirt.    Baseline  11/20/2019: Eval: decreased biliateral FMC skills requiring increased time to complete buttoning.    Time  12    Period  Weeks    Status  New    Target Date  02/12/20            Plan - 01/31/20 1652    Clinical Impression Statement Pt. continues to engage her hand more in tasks at home, and reports that it is much easier now to use her bilateral hands to hold, turn, and  flip the lighter switch to light candles. Pt. Tolerated the grip strength, pinch strength,and FMC exercises well with her bilateral hands. Pt. continues to work on improving Bilateral UE strength, and [facility] skills in order to work towards  improving strength, grip strength, and FMC skills in order to work towards improving, and maximizing independence with ADLs, and IADLs.    Occupational Profile and client history currently impacting functional performance  decreased hand strength, balance and coordination.    Occupational performance deficits (Please refer to evaluation for details):  ADL's;IADL's;Leisure;Education    Rehab Potential  Good    Clinical Decision Making  Several treatment options, min-mod task modification necessary    Comorbidities Affecting Occupational Performance:  May have comorbidities impacting occupational performance    Modification or Assistance to Complete Evaluation   Min-Moderate modification of tasks or assist with assess necessary to complete eval    OT Frequency  Monthly    OT Duration  12 weeks    OT Treatment/Interventions  Self-care/ADL training;DME and/or AE instruction;Therapeutic activities;Therapeutic exercise;Neuromuscular education;Patient/family education    Consulted and Agree with Plan of Care  Patient       Patient will benefit from skilled therapeutic intervention in order to  improve the following deficits and impairments:           Visit Diagnosis: Muscle weakness (generalized)  Other lack of coordination    Problem List There are no problems to display for this patient.   Olegario Messier, MS, OTR/L 01/31/2020, 5:11 PM  [place] [facility] MAIN REHAB  SERVICES [address] [place], [place], [address] Phone: [phone]   Fax:  [phone]  Name: Michelle Randall MRN: [id number] Date of Birth: 08-11-1996

## 2020-02-01 NOTE — Therapy (Signed)
[place] [facility] MAIN [facility] SERVICES [address] [place], [place], [address] Phone: [phone]   Fax:  [phone]  Physical Therapy Treatment  Patient Details  Name: Michelle Randall MRN: [id number] Date of Birth: 11-25-1995 Referring Provider (PT): Carren Rang PA   Encounter Date: 01/31/2020  PT End of Session - 01/31/20 1523    Visit Number  5    Number of Visits  7    Date for PT Re-Evaluation  02/12/20    Authorization Type  27 per year allowed OT and PT combined    Authorization Time Period  12 visits 01/09/20-06/24/20    Authorization - Visit Number  2    Authorization - Number of Visits  12    PT Start Time  1519    PT Stop Time  1615    PT Time Calculation (min)  56 min    Equipment Utilized During Treatment  Gait belt   bilateral AFOs and [place] crutches   Activity Tolerance  Patient tolerated treatment well;No increased pain    Behavior During Therapy  WFL for tasks assessed/performed       Past Medical History:  Diagnosis Date  . Neurogenic bladder   . Neurogenic bowel   . S/P VP shunt   . Spina bifida [facility])     Past Surgical History:  Procedure Laterality Date  . VENTRICULOPERITONEAL SHUNT      There were no vitals filed for this visit.  Subjective Assessment - 01/31/20 1521    Subjective  Pt reports that she is doing well today. No reports of pain. She did have one fall since last therapy session from LOB. She fell backwards onto her buttocks but did not injure herself. No specific questions or concerns at this time.    Pertinent History  Patient is a pleasant 24 year old female who presents for continuation of care for generalized weakness/ coordination deficits secondary to diagnosis of spina bifida. PMH includes VP shunt, spina bifida, and nuerogenic bowel/bladder. Patient was discharged from this clinic on 07/18/19 due to meeting therapy cap for insurance that year. Patient's request for a power chair was denied,  is appealing the process. Has been doing the sitting exercises and laying down ones. Is biking when not cold outside. She reports having multiple near falls when negotiating stairs at home. Patient must ascend/descend stairs to reach bedroom upstairs increasing her risk for falls daily.    Limitations  Walking;Standing;House hold activities;Other (comment)    How long can you sit comfortably?  n/a    How long can you stand comfortably?  require use of bilateral crutches    How long can you walk comfortably?  dependent upon terrain    Patient Stated Goals  Patient wants to be able to negotiate uneven terrain at school, cross thresholds in home environment, and negotiate steps in household     Currently in Pain?  No/denies          TREATMENT   Ther-ex Seated marches x 15 bilateral; Seated LAQ with manual resistance from therapist x 10 bilateral; Seated HS curls with manual resistance from therapist x 10 bilateral; Seated adductor squeeze with manual resistance from therapist x 10 bilateral; Standing marching with BUE support in // bars x 15 bilateral; Mini squats in // bars without UE support x 10; Split squats with BUE support on // bars alternating forward LE x 10 each; Ball kicks in // bars with BUE support x multiple bout on each  leg;   Neuromuscular Re-education Forward/backward gait in // bars with BUE support 2 lengths each; Sidestepping in // bars with BUE support x 2 lengths each direction; Static balance in // bars without UE support in wide and narrow stance x 30s each; Balance in // bars WBOS/NBOS without UE support with horizontal followed by vertical head turns x 30s each in both positions; Airex balance WBOS in // bars without UE support x 30s; Airex balance in // bars without UE support WBOS with horizontal followed by vertical head turns x 30s each; 2" Airex alternating toe taps with 2-1 UE support x 10 each; 6" forward and lateral step ups with BUE support  alternating leading LE x 10 each, with lateral performed x 10 to both directions;   Pt educated throughout session about proper posture and technique with exercises. Improved exercise technique, movement at target joints, use of target muscles after min to mod verbal, visual, tactile cues.   Pt demonstrates excellent motivation during session today. She is able to complete all exercises as instructed today and is able to resumestrengthening and balance exercises. Session focused on exercises in the // bars today. Utilized bilateral and unilateral support for standing exercises.She requires verbal and tactile cues as well as assist for exercises today and would be unable to complete all of her exercises without the direction of a physical therapist.Pt will benefit from PT services to address deficits in strength, balance, and mobility in order to return to full function at [employer] school.                        PT Short Term Goals - 11/21/19 2041      PT SHORT TERM GOAL #1   Title  Patient will report compliance with HEP for continued strengthening and stability during functional mobility.     Baseline  1/5 give next session    Time  4    Period  Weeks    Status  New    Target Date  12/19/19        PT Long Term Goals - 11/21/19 2042      PT LONG TERM GOAL #1   Title  Patient will increase their FOTO score by >10 points to increase to 53% to demonstrate increased function and mobility for higher quality of life.    Baseline  1/5: 42%    Time  12    Period  Weeks    Status  New    Target Date  02/12/20      PT LONG TERM GOAL #2   Title  Patient will increase Berg Balance score by > 6 points (32/56) to demonstrate decreased fall risk during functional activities.    Baseline  1/6: 26/56    Time  12    Period  Weeks    Status  New    Target Date  02/12/20      PT LONG TERM GOAL #3   Title  Patient will be require no assist with ascend/descend  flight of stairs using Least restrictive assistive device.    Baseline  1/6: step to pattern of ascend/descend with near falls    Time  12    Period  Weeks    Status  New    Target Date  02/12/20      PT LONG TERM GOAL #4   Title  Patient will complete a TUG test in < 12 seconds for independent mobility and decreased  fall risk     Baseline  1/6: 16 seconds    Time  12    Period  Weeks    Status  New    Target Date  02/12/20      PT LONG TERM GOAL #5   Title  Patient will increase 10 meter walk test to >1.84m/s as to improve gait speed for better community ambulation and to reduce fall risk.    Baseline  1/6: 0.55 m/s    Time  12    Period  Weeks    Status  New    Target Date  02/12/20            Plan - 01/31/20 1539    Clinical Impression Statement  Pt demonstrates excellent motivation during session today. She is able to complete all exercises as instructed today and is able to resume strengthening and balance exercises. Session focused on exercises in the // bars today. Utilized bilateral and unilateral support for standing exercises. She requires verbal and tactile cues as well as assist for exercises today and would be unable to complete all of her exercises without the direction of a physical therapist. Pt will benefit from PT services to address deficits in strength, balance, and mobility in order to return to full function at home and school.    Personal Factors and Comorbidities  Comorbidity 1;Past/Current Experience;Social Background;Time since onset of injury/illness/exacerbation;Fitness;Transportation    Comorbidities  spina bifida    Examination-Activity Limitations  Bend;Carry;Continence;Lift;Reach Overhead;Squat;Stairs    Examination-Participation Restrictions  Church;Community Activity;Driving;Laundry;Meal Prep;Volunteer;Yard Work    Stability/Clinical Decision Making  Stable/Uncomplicated    Rehab Potential  Good    PT Frequency  Biweekly    PT Duration  12 weeks     PT Treatment/Interventions  ADLs/Self Care Home Management;Aquatic Therapy;Biofeedback;Ultrasound;Moist Heat;Iontophoresis 4mg /ml Dexamethasone;Electrical Stimulation;DME Instruction;Gait training;Stair training;Functional mobility training;Neuromuscular re-education;Balance training;Therapeutic exercise;Therapeutic activities;Patient/family education;Orthotic Fit/Training;Wheelchair mobility training;Manual techniques;Dry needling;Passive range of motion;Energy conservation;Splinting;Taping;Vestibular;Visual/perceptual remediation/compensation    PT Next Visit Plan  continue balance and strengthening    PT Home Exercise Plan  Seated, supine, and core program: [id number]    Consulted and Agree with Plan of Care  Patient       Patient will benefit from skilled therapeutic intervention in order to improve the following deficits and impairments:  Abnormal gait, Decreased mobility, Decreased activity tolerance, Decreased balance, Decreased endurance, Decreased coordination, Decreased range of motion, Difficulty walking, Decreased strength, Impaired flexibility, Increased muscle spasms, Impaired tone, Postural dysfunction, Improper body mechanics  Visit Diagnosis: Muscle weakness (generalized)  Unsteadiness on feet     Problem List There are no problems to display for this patient.  PT, DPT, GCS  Carolyn Sylvia 02/01/2020, 9:17 AM  [place] [facility] MAIN [facility] SERVICES [address] [employer], [place], [place] Phone: [phone]   Fax:  [phone]  Name: PETREA FREDENBURG MRN: Julaine Hua Date of Birth: 1996-07-24

## 2020-02-03 ENCOUNTER — Encounter: Payer: Self-pay | Admitting: Occupational Therapy

## 2020-02-14 ENCOUNTER — Other Ambulatory Visit: Payer: Self-pay

## 2020-02-14 ENCOUNTER — Ambulatory Visit: Payer: Medicaid Other | Attending: Student

## 2020-02-14 DIAGNOSIS — M6281 Muscle weakness (generalized): Secondary | ICD-10-CM | POA: Diagnosis present

## 2020-02-14 DIAGNOSIS — R2681 Unsteadiness on feet: Secondary | ICD-10-CM | POA: Insufficient documentation

## 2020-02-14 DIAGNOSIS — R278 Other lack of coordination: Secondary | ICD-10-CM | POA: Insufficient documentation

## 2020-02-14 DIAGNOSIS — R2689 Other abnormalities of gait and mobility: Secondary | ICD-10-CM | POA: Insufficient documentation

## 2020-02-14 NOTE — Therapy (Signed)
[place] MAIN [facility] SERVICES [address] [place], [place], [address] Phone: [phone]   Fax:  [phone]  Physical Therapy Treatment/Recertification  Patient Details  Name: Michelle Randall MRN: [id number] Date of Birth: Mar 14, 1996 Referring Provider (PT): Michelle Denis PA   Encounter Date: 02/14/2020  PT End of Session - 02/14/20 1538    Visit Number  6    Number of Visits  13    Date for PT Re-Evaluation  05/08/20    Authorization Type  27 per year allowed OT and PT combined    Authorization Time Period  12 visits 01/09/20-06/24/20    Authorization - Visit Number  3    Authorization - Number of Visits  12    PT Start Time  1520    PT Stop Time  1615    PT Time Calculation (min)  55 min    Equipment Utilized During Treatment  Gait belt   bilateral AFOs and [place] crutches   Activity Tolerance  Patient tolerated treatment well;No increased pain    Behavior During Therapy  WFL for tasks assessed/performed       Past Medical History:  Diagnosis Date  . Neurogenic bladder   . Neurogenic bowel   . S/P VP shunt   . Spina bifida [facility])     Past Surgical History:  Procedure Laterality Date  . VENTRICULOPERITONEAL SHUNT      There were no vitals filed for this visit.  Subjective Assessment - 02/14/20 1524    Subjective  Pt reports that she is doing well today. No reports of pain. She did have one fall since last therapy session from LOB while in the bathroom. She has a small bruise on her R hip but no problems bearing weight on RLE. Otherwise no specific questions or concerns.    Pertinent History  Patient is a pleasant 24 year old female who presents for continuation of care for generalized weakness/ coordination deficits secondary to diagnosis of spina bifida. PMH includes VP shunt, spina bifida, and nuerogenic bowel/bladder. Patient was discharged from this clinic on 07/18/19 due to meeting therapy cap for insurance that year.  Patient's request for a power chair was denied, is appealing the process. Has been doing the sitting exercises and laying down ones. Is biking when not cold outside. She reports having multiple near falls when negotiating stairs at home. Patient must ascend/descend stairs to reach bedroom upstairs increasing her risk for falls daily.    Limitations  Walking;Standing;House hold activities;Other (comment)    How long can you sit comfortably?  n/a    How long can you stand comfortably?  require use of bilateral crutches    How long can you walk comfortably?  dependent upon terrain    Patient Stated Goals  Patient wants to be able to negotiate uneven terrain at school, cross thresholds in home environment, and negotiate steps in household     Currently in Pain?  No/denies         [facility] PT Assessment - 02/14/20 1548      Berg Balance Test   Sit to Stand  Able to stand  independently using hands    Standing Unsupported  Able to stand 2 minutes with supervision    Sitting with Back Unsupported but Feet Supported on Floor or Stool  Able to sit safely and securely 2 minutes    Stand to Sit  Controls descent by using hands    Transfers  Able to transfer  safely, definite need of hands    Standing Unsupported with Eyes Closed  Able to stand 10 seconds with supervision    Standing Unsupported with Feet Together  Needs help to attain position but able to stand for 30 seconds with feet together    From Standing, Reach Forward with Outstretched Arm  Can reach forward >5 cm safely (2")    From Standing Position, Pick up Object from Floor  Able to pick up shoe, needs supervision    From Standing Position, Turn to Look Behind Over each Shoulder  Needs supervision when turning    Turn 360 Degrees  Needs assistance while turning    Standing Unsupported, Alternately Place Feet on Step/Stool  Able to complete >2 steps/needs minimal assist    Standing Unsupported, One Foot in Front  Able to take small step  independently and hold 30 seconds    Standing on One Leg  Tries to lift leg/unable to hold 3 seconds but remains standing independently    Total Score  30          TREATMENT   Ther-ex  Removed AFO's for exercises in // bars: Standing marching with BUE support in // bars x 15 bilateral; Mini squats in // bars without UE support 2 x 15; Split squats with BUE support on // bars alternating forward LE 2 x 15 each; 6" forward and lateral step ups with BUE support alternating leading LE x 10 each, with lateral performed x 10 to both directions;    Neuromuscular Re-education Forward/backward gait in // bars with BUE support 2 lengths each direction Sidestepping in // bars with BUE support x 2 lengths each direction; Static balance in // bars without UE support in wide and narrow stance x 30s each; Balance in // bars WBOS/NBOS without UE support with horizontal followed by vertical head turns x 30s each in both positions; Airex balance WBOS in // bars without UE support x 30s; Airex balance in // bars without UE support WBOS with horizontal followed by vertical head turns x 30s each; 2" Airex alternating toe taps with 2-1 UE support x 10 each; Updated outcome measures and goals with patient (see below)     OUTCOME MEASURES: TEST Outcome Interpretation  5 times sit<>stand  10.0s >60 yo, >15 sec indicates increased risk for falls  10 meter walk test      Self-selected: 18.1s = 0.55 m/s, Fastest: 15.8s = 0.63 m/s (0.55      m/s) <1.0 m/s indicates increased risk for falls; limited community ambulator  Timed up and Go  14.9s (16               sec) <14 sec indicates increased risk for falls  Berg Balance Assessment 30/56 (26/56) <36/56 (100% risk for falls), 37-45 (80% risk for falls); 46-51 (>50% risk for falls); 52-55 (lower risk <25% of falls)  FOTO 53% Goals achieved        Pt educated throughout session about proper posture and technique with exercises. Improved exercise  technique, movement at target joints, use of target muscles after min to mod verbal, visual, tactile cues.   Pt demonstrates excellent motivation during session today. She is able to complete all exercises as instructed today and is able to resumestrengthening and balance exercises. Also updated outcome measures and goals with patient today. Her TUG dropped from 16s to 14.9s and her BERG improved from 26/56 to 30/56. Her FOTO score increased from a 42 at initial evaluation to 53 today.  34mgait speed increased from 0.55 m/s to 0.63 m/s. Her 5TSTS is WNL. Pt is making excellent progress with therapy but demonstrates that she had a definite decline in function due to having temporarily stop therapy secondary to payor constraints at the end of 2020. She requires verbal and tactile cues as well as assist for exercises today and would be unable to complete all of her exercises without the direction of a physical therapist.Pt will benefit from PT services to address deficits in strength, balance, and mobility in order to return to full function at [facility].                     PT Short Term Goals - 02/15/20 0816      PT SHORT TERM GOAL #1   Title  Patient will report compliance with HEP for continued strengthening and stability during functional mobility.     Time  6    Period  Weeks    Status  On-going    Target Date  03/28/20        PT Long Term Goals - 02/14/20 1539      PT LONG TERM GOAL #1   Title  Patient will increase their FOTO score by >10 points to increase to 53% to demonstrate increased function and mobility for higher quality of life.    Baseline  1/5: 42%; 02/14/20: 53    Time  12    Period  Weeks    Status  Achieved      PT LONG TERM GOAL #2   Title  Patient will increase Berg Balance score by > 6 points (32/56) to demonstrate decreased fall risk during functional activities.    Baseline  1/6: 26/56; 02/14/20: 30/56    Time  12    Period  Weeks     Status  Partially Met    Target Date  05/08/20      PT LONG TERM GOAL #3   Title  Patient will be require no assist with ascend/descend flight of stairs using Least restrictive assistive device.    Baseline  1/6: step to pattern of ascend/descend with near falls; 02/14/20: step-to pattern with CGA    Time  12    Period  Weeks    Status  Partially Met    Target Date  05/08/20      PT LONG TERM GOAL #4   Title  Patient will complete a TUG test in < 12 seconds for independent mobility and decreased fall risk     Baseline  1/6: 16 seconds; 02/14/20: 14.9s    Time  12    Period  Weeks    Status  Partially Met    Target Date  05/08/20      PT LONG TERM GOAL #5   Title  Patient will increase 10 meter walk test to >1.027m as to improve gait speed for better community ambulation and to reduce fall risk.    Baseline  1/6: 0.55 m/s; 02/14/20: 0.63 m/s    Time  12    Period  Weeks    Status  Partially Met    Target Date  05/08/20            Plan - 02/14/20 1539    Clinical Impression Statement  Pt demonstrates excellent motivation during session today. She is able to complete all exercises as instructed today and is able to resume strengthening and balance exercises. Also updated outcome measures and goals with  patient today. Her TUG dropped from 16s to 14.9s and her BERG improved from 26/56 to 30/56. Her FOTO score increased from a 42 at initial evaluation to 53 today. 23mgait speed increased from 0.55 m/s to 0.63 m/s. Her 5TSTS is WNL. Pt is making excellent progress with therapy but demonstrates that she had a definite decline in function due to having temporarily stop therapy secondary to payor constraints at the end of 2020. She requires verbal and tactile cues as well as assist for exercises today and would be unable to complete all of her exercises without the direction of a physical therapist. Pt will benefit from PT services to address deficits in strength, balance, and mobility in  order to return to full function at home and school.    Personal Factors and Comorbidities  Comorbidity 1;Past/Current Experience;Social Background;Time since onset of injury/illness/exacerbation;Fitness;Transportation    Comorbidities  spina bifida    Examination-Activity Limitations  Bend;Carry;Continence;Lift;Reach Overhead;Squat;Stairs    Examination-Participation Restrictions  Church;Community Activity;Driving;Laundry;Meal Prep;Volunteer;Yard Work    Stability/Clinical Decision Making  Stable/Uncomplicated    Rehab Potential  Good    PT Frequency  Biweekly    PT Duration  12 weeks    PT Treatment/Interventions  ADLs/Self Care Home Management;Aquatic Therapy;Biofeedback;Ultrasound;Moist Heat;Iontophoresis 455mml Dexamethasone;Electrical Stimulation;DME Instruction;Gait training;Stair training;Functional mobility training;Neuromuscular re-education;Balance training;Therapeutic exercise;Therapeutic activities;Patient/family education;Orthotic Fit/Training;Wheelchair mobility training;Manual techniques;Dry needling;Passive range of motion;Energy conservation;Splinting;Taping;Vestibular;Visual/perceptual remediation/compensation    PT Next Visit Plan  continue balance and strengthening    PT Home Exercise Plan  Seated, supine, and core program: [id number]    Consulted and Agree with Plan of Care  Patient       Patient will benefit from skilled therapeutic intervention in order to improve the following deficits and impairments:  Abnormal gait, Decreased mobility, Decreased activity tolerance, Decreased balance, Decreased endurance, Decreased coordination, Decreased range of motion, Difficulty walking, Decreased strength, Impaired flexibility, Increased muscle spasms, Impaired tone, Postural dysfunction, Improper body mechanics  Visit Diagnosis: Muscle weakness (generalized)  Unsteadiness on feet     Problem List There are no problems to display for this patient.  JaPhillips GroutT, DPT,  GCS  Michelle Randall 02/15/2020, 8:43 AM  [place] REDart[facility] CenterERVICES [address]NC[place]hone: [phone] Fax:  [phone]: Michelle QUIZONRN: [id number] of Birth: 9/08-13-1997

## 2020-02-28 ENCOUNTER — Ambulatory Visit: Payer: Medicaid Other

## 2020-02-28 ENCOUNTER — Other Ambulatory Visit: Payer: Self-pay

## 2020-02-28 ENCOUNTER — Encounter: Payer: Self-pay | Admitting: Occupational Therapy

## 2020-02-28 ENCOUNTER — Ambulatory Visit: Payer: Medicaid Other | Admitting: Occupational Therapy

## 2020-02-28 DIAGNOSIS — R278 Other lack of coordination: Secondary | ICD-10-CM

## 2020-02-28 DIAGNOSIS — M6281 Muscle weakness (generalized): Secondary | ICD-10-CM | POA: Diagnosis not present

## 2020-02-28 DIAGNOSIS — R2681 Unsteadiness on feet: Secondary | ICD-10-CM

## 2020-02-28 NOTE — Therapy (Addendum)
[place] MAIN [facility] SERVICES [address] [place], [place], [address] Phone: [phone]   Fax:  [phone]  Occupational Therapy Treatment/Recertification Note  Patient Details  Name: Michelle Randall MRN: [id number] Date of Birth: 1996/10/17 No data recorded  Encounter Date: 02/28/2020  OT End of Session - 02/28/20 1615    Visit Number  4    Number of Visits  7    Date for OT Re-Evaluation  05/22/20    OT Start Time  1620    OT Stop Time  1700    OT Time Calculation (min)  40 min    Activity Tolerance  Patient tolerated treatment well    Behavior During Therapy  [facility] for tasks assessed/performed       Past Medical History:  Diagnosis Date  . Neurogenic bladder   . Neurogenic bowel   . S/P VP shunt   . Spina bifida [facility])     Past Surgical History:  Procedure Laterality Date  . VENTRICULOPERITONEAL SHUNT      There were no vitals filed for this visit.      [facility] OT Assessment - 02/28/20 1622      Coordination   Right 9 Hole Peg Test  20 sec,    Left 9 Hole Peg Test  24 sec.      Hand Function   Right Hand Grip (lbs)  40    Right Hand Lateral Pinch  13 lbs    Right Hand 3 Point Pinch  12 lbs    Left Hand Grip (lbs)  35    Left Hand Lateral Pinch  11 lbs    Left 3 point pinch  15 lbs     Measurements were obtained, and goals were reviewed   OT TREATMENT   Neuro muscular re-education:  Pt. performed [facility] tasks using the Grooved pegboard. Pt. worked on grasping the grooved pegs from a horizontal position, and moving the pegs to a vertical position in the hand to prepare for placing them in the grooved slot. Pt. worked on translatory movements of the hand moving, and storing the pegs in her hand.   Therapeutic Exercise:  Pt. performedbilateralgross gripping with grip strengthener.The gripper was positioned at 23.4# of grip strength force.Pt. worked on Chief Operating Officer for lateral, and 3pt. pinch using  yellow, red, green, blue, and black resistive clips. Pt. worked on placing the clips at various vertical and horizontal angles. Tactile and verbal cues were required for eliciting the desired movement. Pt. Worked on using the [facility] for  Bilateral grip strength: R: 33#, 34.8#, 30.8#, 31.6# L: 39.4#, 33.8#, 36#, 33.2#  Response to Treatment:  Pt. Is making progress overall with UE strength, grip strength, and [place] skills. Pt. continues to engage her hand more in tasks at home, and reports that it is much easier now to use her bilateral hands to hold, turn, and flip the lighter switch to light candles using both hands succeeding on the first try. Pt. Is able to perform the task with one hand, however takes several attempts. Pt. Tolerated the grip strength, pinch strength,and FMC exercises well with her bilateral hands. Pt. continues to work on improving Bilateral UE strength, and [facility] skills in order to work towards improving strength, grip strength, and [place] skills in order to work towards improving her ability to open containers, make her bed, and continue maximizing independence with ADLs, and IADLs.  OT Education - 02/28/20 1615    Education provided  Yes    Education Details  [place] skills, UE strength, POC goals    Person(s) Educated  Patient    Methods  Explanation    Comprehension  Verbalized understanding          OT Long Term Goals - 02/28/20 1622      OT LONG TERM GOAL #1   Title  Patient will demonstrate increased strength in right hand to be able to open containers, jars, and manage kitchen utensils.    Baseline  Bilateral grip strength has improved.  Pt. conitnues to have difficulty opening jars, containers, and kitchen utensils    Time  12    Period  Weeks    Status  On-going    Target Date  05/22/20      OT LONG TERM GOAL #2   Title  Patient will be able to make her bed with modified independence.    Baseline  Pt. has  difficulty completing efficiently.    Time  12    Period  Weeks    Status  New    Target Date  05/22/20      OT LONG TERM GOAL #3   Title  Pt. will perform LE dressing efficiently with modified independence    Baseline  Pt. has is able to manage looser fitting slothing, however has difficulty with tighter fitting clothing.    Time  12    Period  Weeks    Status  On-going    Target Date  05/22/20      OT LONG TERM GOAL #4   Title  Pt. will improve BUE strength by 1 mm grade to be able to perform home management tasks.    Baseline  BUE strength 5/5 overall. Pt. continues to assist to perform home management tasks.    Time  12    Period  Weeks    Status  On-going    Target Date  05/22/20      OT LONG TERM GOAL #5   Title  Pt. will improve Bilateral [facility] skills in order to be able to independently, and efficiently button a shirt.    Baseline  Pt.presents with decreased biliateral [facility] skills requiring increased time to complete buttoning.    Time  12    Period  Weeks    Status  Partially Met    Target Date  05/22/20            Plan - 02/28/20 1621    Clinical Impression Statement Pt. Is making progress overall with UE strength, grip strength, and [place] skills. Pt. continues to engage her hand more in tasks at home, and reports that it is much easier now to use her bilateral hands to hold, turn, and flip the lighter switch to light candles using both hands succeeding on the first try. Pt. Is able to perform the task with one hand, however takes several attempts. Pt. Tolerated the grip strength, pinch strength,and FMC exercises well with her bilateral hands. Pt. continues to work on improving Bilateral UE strength, and [facility] skills in order to work towards improving strength, grip strength, and [place] skills in order to work towards improving her ability to open containers, make her bed, and continue maximizing independence with ADLs, and IADLs.    Occupational Profile and client history  currently impacting functional performance  decreased hand strength, balance and coordination.    Occupational performance deficits (Please refer to evaluation  for details):  ADL's;IADL's;Leisure;Education    Rehab Potential  Good    Clinical Decision Making  Several treatment options, min-mod task modification necessary    Comorbidities Affecting Occupational Performance:  May have comorbidities impacting occupational performance    Modification or Assistance to Complete Evaluation   Min-Moderate modification of tasks or assist with assess necessary to complete eval    OT Frequency  Monthly    OT Duration  12 weeks    OT Treatment/Interventions  Self-care/ADL training;DME and/or AE instruction;Therapeutic activities;Therapeutic exercise;Neuromuscular education;Patient/family education    Plan  Occupational Therapy 1 x per week for ADL and strenghtening.    Consulted and Agree with Plan of Care  Patient       Patient will benefit from skilled therapeutic intervention in order to improve the following deficits and impairments:           Visit Diagnosis: Other lack of coordination  Muscle weakness (generalized)    Problem List There are no problems to display for this patient.   Harrel Carina, MS, OTR/L 02/28/2020, 4:56 PM  [place] MAIN [facility] SERVICES [address] [place], [place], [address] Phone: [phone]   Fax:  [phone]  Name: WINDY DUDEK MRN: [id number] Date of Birth: 01/31/1996

## 2020-02-28 NOTE — Therapy (Signed)
[place] MAIN [facility] SERVICES [address] [place], [place], [address] Phone: [phone]   Fax:  [phone]  Physical Therapy Treatment  Patient Details  Name: Michelle Randall MRN: [id number] Date of Birth: 01/21/1996 Referring Provider (PT): Wayland Denis PA   Encounter Date: 02/28/2020  PT End of Session - 02/28/20 1724    Visit Number  7    Number of Visits  13    Date for PT Re-Evaluation  05/08/20    Authorization Type  27 per year allowed OT and PT combined    Authorization Time Period  12 visits 01/09/20-06/24/20    Authorization - Visit Number  4    Authorization - Number of Visits  12    PT Start Time  1520    PT Stop Time  1615    PT Time Calculation (min)  55 min    Equipment Utilized During Treatment  Gait belt   bilateral AFOs and Lofstrand crutches   Activity Tolerance  Patient tolerated treatment well;No increased pain    Behavior During Therapy  WFL for tasks assessed/performed       Past Medical History:  Diagnosis Date  . Neurogenic bladder   . Neurogenic bowel   . S/P VP shunt   . Spina bifida [facility])     Past Surgical History:  Procedure Laterality Date  . VENTRICULOPERITONEAL SHUNT      There were no vitals filed for this visit.  Subjective Assessment - 02/28/20 1523    Subjective  Pt reports that she is doing well today. No reports of pain. No falls since last therapy session. No specific questions or concerns.    Pertinent History  Patient is a pleasant 24 year old female who presents for continuation of care for generalized weakness/ coordination deficits secondary to diagnosis of spina bifida. PMH includes VP shunt, spina bifida, and nuerogenic bowel/bladder. Patient was discharged from this clinic on 07/18/19 due to meeting therapy cap for insurance that year. Patient's request for a power chair was denied, is appealing the process. Has been doing the sitting exercises and laying down ones. Is biking  when not cold outside. She reports having multiple near falls when negotiating stairs at home. Patient must ascend/descend stairs to reach bedroom upstairs increasing her risk for falls daily.    Limitations  Walking;Standing;House hold activities;Other (comment)    How long can you sit comfortably?  n/a    How long can you stand comfortably?  require use of bilateral crutches    How long can you walk comfortably?  dependent upon terrain    Patient Stated Goals  Patient wants to be able to negotiate uneven terrain at school, cross thresholds in home environment, and negotiate steps in household     Currently in Pain?  No/denies          TREATMENT   Ther-ex  Removed AFO's for exercises in // bars: Standing marching with BUE support in // bars x 20 bilateral; Mini squats in // bars without UE support x 20; Split squats with BUE support on // bars alternating forward LE x 20 each; 6"forwardstep ups with BUE support alternating leading LE x 10 each, with lateral performed x 10 to both directions;    Neuromuscular Re-education Forward/backward gait in // bars with BUE support 2 lengths each direction Sidestepping in // bars with BUE support x 2 lengths each direction; Static balance in // bars without UE supportin wide and narrow stancex 30s  each; Balance in // barsWBOS/NBOSwithout UE support with horizontal followed by vertical head turns x 30s eachin both positions; Airex balanceWBOSin // bars without UE support x 30s; Airex balance in // bars without UE supportWBOSwith horizontal followed by vertical head turns x 30s each; 6" step alternating toe taps with 2-1 UE support x 10 each; Airex balance basketball tosses x 7 minutes playing "HORSE" with therapist. Pt tosses ball in WBOS without UE support but with occasional 2 finger assist;    Pt educated throughout session about proper posture and technique with exercises. Improved exercise technique, movement at target  joints, use of target muscles after min to mod verbal, visual, tactile cues.    Pt demonstrates excellent motivation during session today. She is able to complete all exercises as instructed today and is able to resumestrengthening and balance exercises. Session focused on exercises in the // bars today with more focus on balance. Utilized bilateral and unilateral support for standing exercises. Incorporated basketball tosses today to challenge dynamic balance on unstable Airex pad today.She requires verbal and tactile cues as well as assist for exercises today and would be unable to complete all of her exercises without the direction of a physical therapist.Pt will benefit from PT services to address deficits in strength, balance, and mobility in order to return to full function at [employer] school.                  PT Short Term Goals - 02/15/20 0816      PT SHORT TERM GOAL #1   Title  Patient will report compliance with HEP for continued strengthening and stability during functional mobility.     Time  6    Period  Weeks    Status  On-going    Target Date  03/28/20        PT Long Term Goals - 02/14/20 1539      PT LONG TERM GOAL #1   Title  Patient will increase their FOTO score by >10 points to increase to 53% to demonstrate increased function and mobility for higher quality of life.    Baseline  1/5: 42%; 02/14/20: 53    Time  12    Period  Weeks    Status  Achieved      PT LONG TERM GOAL #2   Title  Patient will increase Berg Balance score by > 6 points (32/56) to demonstrate decreased fall risk during functional activities.    Baseline  1/6: 26/56; 02/14/20: 30/56    Time  12    Period  Weeks    Status  Partially Met    Target Date  05/08/20      PT LONG TERM GOAL #3   Title  Patient will be require no assist with ascend/descend flight of stairs using Least restrictive assistive device.    Baseline  1/6: step to pattern of ascend/descend with near falls;  02/14/20: step-to pattern with CGA    Time  12    Period  Weeks    Status  Partially Met    Target Date  05/08/20      PT LONG TERM GOAL #4   Title  Patient will complete a TUG test in < 12 seconds for independent mobility and decreased fall risk     Baseline  1/6: 16 seconds; 02/14/20: 14.9s    Time  12    Period  Weeks    Status  Partially Met    Target Date  05/08/20  PT LONG TERM GOAL #5   Title  Patient will increase 10 meter walk test to >1.6ms as to improve gait speed for better community ambulation and to reduce fall risk.    Baseline  1/6: 0.55 m/s; 02/14/20: 0.63 m/s    Time  12    Period  Weeks    Status  Partially Met    Target Date  05/08/20            Plan - 02/28/20 1724    Clinical Impression Statement  Pt demonstrates excellent motivation during session today. She is able to complete all exercises as instructed today and is able to resume strengthening and balance exercises. Session focused on exercises in the // bars today with more focus on balance. Utilized bilateral and unilateral support for standing exercises. Incorporated basketball tosses today to challenge dynamic balance on unstable Airex pad today. She requires verbal and tactile cues as well as assist for exercises today and would be unable to complete all of her exercises without the direction of a physical therapist. Pt will benefit from PT services to address deficits in strength, balance, and mobility in order to return to full function at home and school.    Personal Factors and Comorbidities  Comorbidity 1;Past/Current Experience;Social Background;Time since onset of injury/illness/exacerbation;Fitness;Transportation    Comorbidities  spina bifida    Examination-Activity Limitations  Bend;Carry;Continence;Lift;Reach Overhead;Squat;Stairs    Examination-Participation Restrictions  Church;Community Activity;Driving;Laundry;Meal Prep;Volunteer;Yard Work    Stability/Clinical Decision Making   Stable/Uncomplicated    Rehab Potential  Good    PT Frequency  Biweekly    PT Duration  12 weeks    PT Treatment/Interventions  ADLs/Self Care Home Management;Aquatic Therapy;Biofeedback;Ultrasound;Moist Heat;Iontophoresis 499mml Dexamethasone;Electrical Stimulation;DME Instruction;Gait training;Stair training;Functional mobility training;Neuromuscular re-education;Balance training;Therapeutic exercise;Therapeutic activities;Patient/family education;Orthotic Fit/Training;Wheelchair mobility training;Manual techniques;Dry needling;Passive range of motion;Energy conservation;Splinting;Taping;Vestibular;Visual/perceptual remediation/compensation    PT Next Visit Plan  continue balance and strengthening    PT Home Exercise Plan  Seated, supine, and core program: [id number]    Consulted and Agree with Plan of Care  Patient       Patient will benefit from skilled therapeutic intervention in order to improve the following deficits and impairments:  Abnormal gait, Decreased mobility, Decreased activity tolerance, Decreased balance, Decreased endurance, Decreased coordination, Decreased range of motion, Difficulty walking, Decreased strength, Impaired flexibility, Increased muscle spasms, Impaired tone, Postural dysfunction, Improper body mechanics  Visit Diagnosis: Muscle weakness (generalized)  Unsteadiness on feet     Problem List There are no problems to display for this patient.  JaPhillips GroutT, DPT, GCS  Angelina Neece 02/28/2020, 5:30 PM  [place] REHunting[facility] CenterERVICES [address]dKn[place]hone: [phone] Fax:  [phone]: Michelle ZUBARN: [id number] of Birth: [birth year]

## 2020-02-28 NOTE — Addendum Note (Signed)
Addended by: Avon Gully on: 02/28/2020 05:20 PM   Modules accepted: Orders

## 2020-03-13 ENCOUNTER — Other Ambulatory Visit: Payer: Self-pay

## 2020-03-13 ENCOUNTER — Ambulatory Visit: Payer: Medicaid Other

## 2020-03-13 DIAGNOSIS — R2681 Unsteadiness on feet: Secondary | ICD-10-CM

## 2020-03-13 DIAGNOSIS — M6281 Muscle weakness (generalized): Secondary | ICD-10-CM

## 2020-03-13 DIAGNOSIS — R2689 Other abnormalities of gait and mobility: Secondary | ICD-10-CM

## 2020-03-13 NOTE — Therapy (Signed)
[place] MAIN [facility] SERVICES [address] [place], [place], [address] Phone: [phone]   Fax:  [phone]  Physical Therapy Treatment  Patient Details  Name: Michelle Randall MRN: [id number] Date of Birth: 06-17-1996 Referring Provider (PT): Wayland Denis PA   Encounter Date: 03/13/2020  PT End of Session - 03/13/20 1530    Visit Number  8    Number of Visits  13    Date for PT Re-Evaluation  05/08/20    Authorization Type  27 per year allowed OT and PT combined    Authorization Time Period  12 visits 01/09/20-06/24/20    Authorization - Visit Number  5    Authorization - Number of Visits  12    PT Start Time  1520    PT Stop Time  1615    PT Time Calculation (min)  55 min    Equipment Utilized During Treatment  Gait belt   bilateral AFOs and Lofstrand crutches   Activity Tolerance  Patient tolerated treatment well;No increased pain    Behavior During Therapy  WFL for tasks assessed/performed       Past Medical History:  Diagnosis Date  . Neurogenic bladder   . Neurogenic bowel   . S/P VP shunt   . Spina bifida [facility])     Past Surgical History:  Procedure Laterality Date  . VENTRICULOPERITONEAL SHUNT      There were no vitals filed for this visit.  Subjective Assessment - 03/13/20 1528    Subjective  Pt reports that she is doing well today. No reports of pain. No falls since last therapy session. No specific questions or concerns.    Pertinent History  Patient is a pleasant 24 year old female who presents for continuation of care for generalized weakness/ coordination deficits secondary to diagnosis of spina bifida. PMH includes VP shunt, spina bifida, and nuerogenic bowel/bladder. Patient was discharged from this clinic on 07/18/19 due to meeting therapy cap for insurance that year. Patient's request for a power chair was denied, is appealing the process. Has been doing the sitting exercises and laying down ones. Is biking  when not cold outside. She reports having multiple near falls when negotiating stairs at home. Patient must ascend/descend stairs to reach bedroom upstairs increasing her risk for falls daily.    Limitations  Walking;Standing;House hold activities;Other (comment)    How long can you sit comfortably?  n/a    How long can you stand comfortably?  require use of bilateral crutches    How long can you walk comfortably?  dependent upon terrain    Patient Stated Goals  Patient wants to be able to negotiate uneven terrain at school, cross thresholds in home environment, and negotiate steps in household     Currently in Pain?  No/denies         TREATMENT   Ther-ex Removed AFO's for exercises in // bars: Standing marching with BUE support in // bars x 20 bilateral; Mini squats in // bars without UE supportx 20; Split squats with BUE support on // bars alternating forward LEx 20each; Seated LAQ with manual resistance from therapist x 10 bilateral; Seated HS curls with red tband x 10 bilateral; Seated adductor squeeze with manual resistance from therapist x 10 bilateral; Sit to stand without UE support x 10; 6"forwardstep ups with BUE support alternating leading LE x 10 each;   Neuromuscular Re-education Ball kicks in // bars with BUE support x multiple bout on each  leg; Forward/backward gait in // bars with BUE support 2 lengths eachdirection Sidestepping in // bars with BUE support x 2 lengths each direction; Static balance in // bars without UE supportin wide and narrow stancex 30s each; Airex balanceWBOSin // bars without UE support x 30s;    Pt educated throughout session about proper posture and technique with exercises. Improved exercise technique, movement at target joints, use of target muscles after min to mod verbal, visual, tactile cues.    Pt demonstrates excellent motivation during session today. She is able to complete all exercises as instructed today and is  able to resumestrengthening and balance exercises.Session focused on exercises in the // bars today with more focus on balance. Utilized bilateral and unilateral support for standing exercises. AFOs removed during session to challenge ankle stability.She requires verbal and tactile cues as well as assist for exercises today and would be unable to complete all of her exercises without the direction of a physical therapist.Pt will benefit from PT services to address deficits in strength, balance, and mobility in order to return to full function at [employer] school.                            PT Short Term Goals - 02/15/20 0816      PT SHORT TERM GOAL #1   Title  Patient will report compliance with HEP for continued strengthening and stability during functional mobility.     Time  6    Period  Weeks    Status  On-going    Target Date  03/28/20        PT Long Term Goals - 02/14/20 1539      PT LONG TERM GOAL #1   Title  Patient will increase their FOTO score by >10 points to increase to 53% to demonstrate increased function and mobility for higher quality of life.    Baseline  1/5: 42%; 02/14/20: 53    Time  12    Period  Weeks    Status  Achieved      PT LONG TERM GOAL #2   Title  Patient will increase Berg Balance score by > 6 points (32/56) to demonstrate decreased fall risk during functional activities.    Baseline  1/6: 26/56; 02/14/20: 30/56    Time  12    Period  Weeks    Status  Partially Met    Target Date  05/08/20      PT LONG TERM GOAL #3   Title  Patient will be require no assist with ascend/descend flight of stairs using Least restrictive assistive device.    Baseline  1/6: step to pattern of ascend/descend with near falls; 02/14/20: step-to pattern with CGA    Time  12    Period  Weeks    Status  Partially Met    Target Date  05/08/20      PT LONG TERM GOAL #4   Title  Patient will complete a TUG test in < 12 seconds for independent  mobility and decreased fall risk     Baseline  1/6: 16 seconds; 02/14/20: 14.9s    Time  12    Period  Weeks    Status  Partially Met    Target Date  05/08/20      PT LONG TERM GOAL #5   Title  Patient will increase 10 meter walk test to >1.31ms as to improve gait speed for better community  ambulation and to reduce fall risk.    Baseline  1/6: 0.55 Michelle/s; 02/14/20: 0.63 Michelle/s    Time  12    Period  Weeks    Status  Partially Met    Target Date  05/08/20            Plan - 03/13/20 1530    Clinical Impression Statement  Pt demonstrates excellent motivation during session today. She is able to complete all exercises as instructed today and is able to resume strengthening and balance exercises. Session focused on exercises in the // bars today with more focus on balance. Utilized bilateral and unilateral support for standing exercises. AFOs removed during session to challenge ankle stability. She requires verbal and tactile cues as well as assist for exercises today and would be unable to complete all of her exercises without the direction of a physical therapist. Pt will benefit from PT services to address deficits in strength, balance, and mobility in order to return to full function at home and school.    Personal Factors and Comorbidities  Comorbidity 1;Past/Current Experience;Social Background;Time since onset of injury/illness/exacerbation;Fitness;Transportation    Comorbidities  spina bifida    Examination-Activity Limitations  Bend;Carry;Continence;Lift;Reach Overhead;Squat;Stairs    Examination-Participation Restrictions  Church;Community Activity;Driving;Laundry;Meal Prep;Volunteer;Yard Work    Stability/Clinical Decision Making  Stable/Uncomplicated    Rehab Potential  Good    PT Frequency  Biweekly    PT Duration  12 weeks    PT Treatment/Interventions  ADLs/Self Care Home Management;Aquatic Therapy;Biofeedback;Ultrasound;Moist Heat;Iontophoresis 1m/ml Dexamethasone;Electrical  Stimulation;DME Instruction;Gait training;Stair training;Functional mobility training;Neuromuscular re-education;Balance training;Therapeutic exercise;Therapeutic activities;Patient/family education;Orthotic Fit/Training;Wheelchair mobility training;Manual techniques;Dry needling;Passive range of motion;Energy conservation;Splinting;Taping;Vestibular;Visual/perceptual remediation/compensation    PT Next Visit Plan  continue balance and strengthening    PT Home Exercise Plan  Seated, supine, and core program: [id number]    Consulted and Agree with Plan of Care  Patient       Patient will benefit from skilled therapeutic intervention in order to improve the following deficits and impairments:  Abnormal gait, Decreased mobility, Decreased activity tolerance, Decreased balance, Decreased endurance, Decreased coordination, Decreased range of motion, Difficulty walking, Decreased strength, Impaired flexibility, Increased muscle spasms, Impaired tone, Postural dysfunction, Improper body mechanics  Visit Diagnosis: Muscle weakness (generalized)  Unsteadiness on feet  Other abnormalities of gait and mobility     Problem List There are no problems to display for this patient.  JPhillips GroutPT, DPT, GCS   Michelle Randall 03/13/2020, 5:33 PM  [place] [facility]Mer[facility]ClintonSERVICES [address] N[place] [address]Phone: [phone]  Fax:  [phone] Name: MGARI TROVATOMRN: [id number] of Birth: 906-Dec-1997

## 2020-03-24 ENCOUNTER — Ambulatory Visit: Payer: Medicaid Other | Attending: Student | Admitting: Occupational Therapy

## 2020-03-24 ENCOUNTER — Ambulatory Visit: Payer: Medicaid Other

## 2020-03-24 ENCOUNTER — Encounter: Payer: Self-pay | Admitting: Occupational Therapy

## 2020-03-24 ENCOUNTER — Other Ambulatory Visit: Payer: Self-pay

## 2020-03-24 DIAGNOSIS — M6281 Muscle weakness (generalized): Secondary | ICD-10-CM | POA: Insufficient documentation

## 2020-03-24 DIAGNOSIS — R531 Weakness: Secondary | ICD-10-CM | POA: Diagnosis not present

## 2020-03-24 DIAGNOSIS — R2681 Unsteadiness on feet: Secondary | ICD-10-CM | POA: Diagnosis present

## 2020-03-24 DIAGNOSIS — R278 Other lack of coordination: Secondary | ICD-10-CM | POA: Diagnosis present

## 2020-03-24 NOTE — Therapy (Signed)
[place] MAIN [facility] SERVICES [address] [place], [place], [address] Phone: [phone]   Fax:  [phone]  Occupational Therapy Treatment  Patient Details  Name: Michelle Randall MRN: [id number] Date of Birth: [birth year] No data recorded  Encounter Date: 03/24/2020  OT End of Session - 03/24/20 1328    Visit Number  5    Date for OT Re-Evaluation  05/22/20    Authorization Type  Medicaid    OT Start Time  1315    OT Stop Time  1345    OT Time Calculation (min)  30 min    Activity Tolerance  Patient tolerated treatment well    Behavior During Therapy  [facility] for tasks assessed/performed       Past Medical History:  Diagnosis Date  . Neurogenic bladder   . Neurogenic bowel   . S/P VP shunt   . Spina bifida [facility])     Past Surgical History:  Procedure Laterality Date  . VENTRICULOPERITONEAL SHUNT      There were no vitals filed for this visit.  Subjective Assessment - 03/24/20 1328    Subjective   Pt. reports doing well today    Patient is accompanied by:  Family member    Pertinent History  Pt. is a 24 y.o. female who who was diagnosed with Spina Bifida. 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 Pt. is taking online courses through [employer] until she can resume classes at [employer] once the pandemic is over. The patient is working towards pursuing a degree in Occupational Therapy. Pt. has experience a decline in UE strength, [place] skills, ADL,a nd IADL functioning. Pt. was working as a Writer prior to the pandemic, and has note been able to work since.    Currently in Pain?  No/denies       OT TREATMENT   Neuro muscular re-education:  Pt. worked on [facility] skills grasping 1" sticks, 1/4" collars, and 1/4" washers. Pt. worked on storing the objects in the palm, and translatory skills moving the items from the palm of the hand to the tip of the 2nd digit, and thumb.  Pt. Removed the items alternating thumb opposition to  the tip of her 2nd digit to thumb.   Therapeutic Exercise:  Pt. performedbilateralgross gripping with grip strengthener.The gripper was positioned 17.9# of grip strength force. Pt. worked on placing the clips at various vertical and horizontal angles. Pt. worked on using the Hydrographic surveyor for Bilateral grip strength: R: 32.2#, 33.8#, 37.4#, 27.6# L: 30.8#, 34#, 29.4#, 36#  Response to Treatment:  Pt. continues to make progress overall with BUE strength, grip strength, and [place] skills. Pt.continues to tolerate grip strengthening, and [place] and [place] tasks wellwith her bilateral hands.Pt. continues to work on improving Bilateral UE strength, and [facility] skills in order to work towards improving strength, grip strength, and [place] skills in order to work towards improving her ability to open containers, make her bed, and continue maximizing independence with ADLs, and IADLs.                        OT Education - 03/24/20 1328    Education provided  Yes    Education Details  [place] skills, UE strength, POC goals    Person(s) Educated  Patient    Methods  Explanation    Comprehension  Verbalized understanding          OT Long Term Goals -  02/28/20 1622      OT LONG TERM GOAL #1   Title  Patient will demonstrate increased strength in right hand to be able to open containers, jars, and manage kitchen utensils.    Baseline  Bilateral grip strength has improved.  Pt. conitnues to have difficulty opening jars, containers, and kitchen utensils    Time  12    Period  Weeks    Status  On-going    Target Date  05/22/20      OT LONG TERM GOAL #2   Title  Patient will be able to make her bed with modified independence.    Baseline  Pt. has difficulty completing efficiently.    Time  12    Period  Weeks    Status  New    Target Date  05/22/20      OT LONG TERM GOAL #3   Title  Pt. will perform LE dressing efficiently with modified independence    Baseline  Pt.  has is able to manage looser fitting slothing, however has difficulty with tighter fitting clothing.    Time  12    Period  Weeks    Status  On-going    Target Date  05/22/20      OT LONG TERM GOAL #4   Title  Pt. will improve BUE strength by 1 mm grade to be able to perform home management tasks.    Baseline  BUE strength 5/5 overall. Pt. continues to assist to perform home management tasks.    Time  12    Period  Weeks    Status  On-going    Target Date  05/22/20      OT LONG TERM GOAL #5   Title  Pt. will improve Bilateral [facility] skills in order to be able to independently, and efficiently button a shirt.    Baseline  Pt.presents with decreased biliateral [facility] skills requiring increased time to complete buttoning.    Time  12    Period  Weeks    Status  Partially Met    Target Date  05/22/20            Plan - 03/24/20 1329    Clinical Impression Statement  Pt. continues to make progress overall with BUE strength, grip strength, and [place] skills. Pt.continues to tolerate grip strengthening, and [place] and [place] tasks wellwith her bilateral hands.Pt. continues to work on improving Bilateral UE strength, and [facility] skills in order to work towards improving strength, grip strength, and [place] skills in order to work towards improving her ability to open containers, make her bed, and continue maximizing independence with ADLs, and IADLs.      Occupational Profile and client history currently impacting functional performance  decreased hand strength, balance and coordination.    Occupational performance deficits (Please refer to evaluation for details):  ADL's;IADL's;Leisure;Education    Rehab Potential  Good    Clinical Decision Making  Several treatment options, min-mod task modification necessary    Modification or Assistance to Complete Evaluation   Min-Moderate modification of tasks or assist with assess necessary to complete eval    OT Frequency  Monthly    OT Duration  12 weeks    OT  Treatment/Interventions  Self-care/ADL training;DME and/or AE instruction;Therapeutic activities;Therapeutic exercise;Neuromuscular education;Patient/family education    Consulted and Agree with Plan of Care  Patient       Patient will benefit from skilled therapeutic intervention in order to improve the following deficits and impairments:  Visit Diagnosis: Muscle weakness (generalized)  Other lack of coordination    Problem List There are no problems to display for this patient.   Harrel Carina, MS, OTR/L 03/24/2020, 1:31 PM  [place] MAIN [facility] SERVICES [address] [place], [place], [address] Phone: [phone]   Fax:  [phone]  Name: MILLEY VINING MRN: [id number] Date of Birth: [birth year]

## 2020-03-24 NOTE — Therapy (Signed)
[place] MAIN [facility] SERVICES [address] [place], [place], [address] Phone: [phone]   Fax:  [phone]  Physical Therapy Treatment  Patient Details  Name: Michelle Randall MRN: [id number] Date of Birth: [birth year] Referring Provider (PT): Wayland Denis PA   Encounter Date: 03/24/2020  PT End of Session - 03/24/20 1405    Visit Number  9    Number of Visits  13    Date for PT Re-Evaluation  05/08/20    Authorization Type  27 per year allowed OT and PT combined    Authorization Time Period  12 visits 01/09/20-06/24/20    Authorization - Visit Number  6    Authorization - Number of Visits  12    PT Start Time  1400    PT Stop Time  1430    PT Time Calculation (min)  30 min    Equipment Utilized During Treatment  Gait belt;Other (comment)   AFO   Activity Tolerance  Patient tolerated treatment well;No increased pain    Behavior During Therapy  WFL for tasks assessed/performed       Past Medical History:  Diagnosis Date  . Neurogenic bladder   . Neurogenic bowel   . S/P VP shunt   . Spina bifida [facility])     Past Surgical History:  Procedure Laterality Date  . VENTRICULOPERITONEAL SHUNT      There were no vitals filed for this visit.  Subjective Assessment - 03/24/20 1402    Subjective  Pt says she is doing good in general. Has been doing bike ride when weather has nice, otherwise limited activity during day d/t fasting for holiday.    Pertinent History  Patient is a pleasant 24 year old female who presents for continuation of care for generalized weakness/ coordination deficits secondary to diagnosis of spina bifida. PMH includes VP shunt, spina bifida, and nuerogenic bowel/bladder. Patient was discharged from this clinic on 07/18/19 due to meeting therapy cap for insurance that year. Patient's request for a power chair was denied, is appealing the process. Has been doing the sitting exercises and laying down ones. Is biking when  not cold outside. She reports having multiple near falls when negotiating stairs at home. Patient must ascend/descend stairs to reach bedroom upstairs increasing her risk for falls daily.    Currently in Pain?  No/denies       INTERVENTION THIS DATE Removed AFO's for exercises in // bars:  -sit to stand without UE support x 10; -seated stability marching on dyna disc, LUE intermittent support 1x20 -mini squats in // bars without UE support x 20; -standing marching with BUE support in // bars x 20 bilateral;  -Sidestepping in // bars with BUE support x 2 lengths each direction. -Forward/backward gait in // bars with BUE support 2 lengths each direction -Airex balance WBOS in // bars without UE support x 60s;     PT Short Term Goals - 02/15/20 0816      PT SHORT TERM GOAL #1   Title  Patient will report compliance with HEP for continued strengthening and stability during functional mobility.     Time  6    Period  Weeks    Status  On-going    Target Date  03/28/20        PT Long Term Goals - 02/14/20 1539      PT LONG TERM GOAL #1   Title  Patient will increase their FOTO score by >10 points  to increase to 53% to demonstrate increased function and mobility for higher quality of life.    Baseline  1/5: 42%; 02/14/20: 53    Time  12    Period  Weeks    Status  Achieved      PT LONG TERM GOAL #2   Title  Patient will increase Berg Balance score by > 6 points (32/56) to demonstrate decreased fall risk during functional activities.    Baseline  1/6: 26/56; 02/14/20: 30/56    Time  12    Period  Weeks    Status  Partially Met    Target Date  05/08/20      PT LONG TERM GOAL #3   Title  Patient will be require no assist with ascend/descend flight of stairs using Least restrictive assistive device.    Baseline  1/6: step to pattern of ascend/descend with near falls; 02/14/20: step-to pattern with CGA    Time  12    Period  Weeks    Status  Partially Met    Target Date  05/08/20       PT LONG TERM GOAL #4   Title  Patient will complete a TUG test in < 12 seconds for independent mobility and decreased fall risk     Baseline  1/6: 16 seconds; 02/14/20: 14.9s    Time  12    Period  Weeks    Status  Partially Met    Target Date  05/08/20      PT LONG TERM GOAL #5   Title  Patient will increase 10 meter walk test to >1.82ms as to improve gait speed for better community ambulation and to reduce fall risk.    Baseline  1/6: 0.55 m/s; 02/14/20: 0.63 m/s    Time  12    Period  Weeks    Status  Partially Met    Target Date  05/08/20            Plan - 03/24/20 1407    Clinical Impression Statement Pt able to complete entire session as planned with rest breaks provided as needed. Pt maintains high level of focus and motivation. Extensive verbal, visual, and tactile cues are provided for most accurate form possible. Author provides minA intermittently for full ROM when needed. Overall pt continues to make steady progress toward treatment goals.    Personal Factors and Comorbidities  Comorbidity 1;Past/Current Experience;Social Background;Time since onset of injury/illness/exacerbation;Fitness;Transportation    Comorbidities  spina bifida    Examination-Activity Limitations  Bend;Carry;Continence;Lift;Reach Overhead;Squat;Stairs    Examination-Participation Restrictions  Church;Community Activity;Driving;Laundry;Meal Prep;Volunteer;Yard Work    Stability/Clinical Decision Making  Stable/Uncomplicated    Clinical Decision Making  Low    Rehab Potential  Good    PT Frequency  Biweekly    PT Duration  12 weeks    PT Treatment/Interventions  ADLs/Self Care Home Management;Aquatic Therapy;Biofeedback;Ultrasound;Moist Heat;Iontophoresis 4110mml Dexamethasone;Electrical Stimulation;DME Instruction;Gait training;Stair training;Functional mobility training;Neuromuscular re-education;Balance training;Therapeutic exercise;Therapeutic activities;Patient/family education;Orthotic  Fit/Training;Wheelchair mobility training;Manual techniques;Dry needling;Passive range of motion;Energy conservation;Splinting;Taping;Vestibular;Visual/perceptual remediation/compensation    PT Next Visit Plan  continue balance and strengthening    PT Home Exercise Plan  Seated, supine, and core program: [id number]       Patient will benefit from skilled therapeutic intervention in order to improve the following deficits and impairments:  Abnormal gait, Decreased mobility, Decreased activity tolerance, Decreased balance, Decreased endurance, Decreased coordination, Decreased range of motion, Difficulty walking, Decreased strength, Impaired flexibility, Increased muscle spasms, Impaired tone, Postural dysfunction, Improper body mechanics  Visit Diagnosis: Muscle weakness (generalized)  Unsteadiness on feet  Weakness generalized     Problem List There are no problems to display for this patient.  2:28 PM, 03/24/20 Etta Grandchild, PT, DPT Physical Therapist - [facility]  Outpatient Physical Therapy- [place] [phone]     Etta Grandchild 03/24/2020, 2:18 PM  [place] MAIN [facility] SERVICES [address] [place], [place], [address] Phone: [phone]   Fax:  [phone]  Name: Michelle Randall MRN: [id number] Date of Birth: [birth year]

## 2020-03-27 ENCOUNTER — Encounter: Payer: Medicaid Other | Admitting: Occupational Therapy

## 2020-03-27 ENCOUNTER — Ambulatory Visit: Payer: Medicaid Other

## 2020-04-09 ENCOUNTER — Other Ambulatory Visit: Payer: Self-pay

## 2020-04-09 ENCOUNTER — Ambulatory Visit: Payer: Medicaid Other

## 2020-04-09 DIAGNOSIS — M6281 Muscle weakness (generalized): Secondary | ICD-10-CM | POA: Diagnosis not present

## 2020-04-09 DIAGNOSIS — R2681 Unsteadiness on feet: Secondary | ICD-10-CM

## 2020-04-09 NOTE — Therapy (Signed)
[place] MAIN [facility] SERVICES [address] [place], [place], [address] Phone: [phone]   Fax:  [phone]  Physical Therapy Progress Note   Dates of reporting period  11/20/19   to   04/09/20  Patient Details  Name: Michelle Randall MRN: [id number] Date of Birth: 06-01-1996 Referring Provider (PT): Wayland Denis PA   Encounter Date: 04/09/2020  PT End of Session - 04/09/20 1525    Visit Number  10    Number of Visits  13    Date for PT Re-Evaluation  05/08/20    Authorization Type  27 per year allowed OT and PT combined    Authorization Time Period  12 visits 01/09/20-06/24/20    Authorization - Visit Number  7    Authorization - Number of Visits  12    PT Start Time  9937    PT Stop Time  1600    PT Time Calculation (min)  43 min    Equipment Utilized During Treatment  Gait belt;Other (comment)   AFO   Activity Tolerance  Patient tolerated treatment well;No increased pain    Behavior During Therapy  WFL for tasks assessed/performed       Past Medical History:  Diagnosis Date  . Neurogenic bladder   . Neurogenic bowel   . S/P VP shunt   . Spina bifida [facility])     Past Surgical History:  Procedure Laterality Date  . VENTRICULOPERITONEAL SHUNT      There were no vitals filed for this visit.  Subjective Assessment - 04/10/20 0952    Subjective  Pt reports that she is doing well today. No reports of pain. No falls since last therapy session. No specific questions or concerns.    Pertinent History  Patient is a pleasant 24 year old female who presents for continuation of care for generalized weakness/ coordination deficits secondary to diagnosis of spina bifida. PMH includes VP shunt, spina bifida, and nuerogenic bowel/bladder. Patient was discharged from this clinic on 07/18/19 due to meeting therapy cap for insurance that year. Patient's request for a power chair was denied, is appealing the process. Has been doing the sitting  exercises and laying down ones. Is biking when not cold outside. She reports having multiple near falls when negotiating stairs at home. Patient must ascend/descend stairs to reach bedroom upstairs increasing her risk for falls daily.    Limitations  Walking;Standing;House hold activities;Other (comment)    How long can you sit comfortably?  n/a    How long can you stand comfortably?  require use of bilateral crutches    How long can you walk comfortably?  dependent upon terrain    Patient Stated Goals  Patient wants to be able to negotiate uneven terrain at school, cross thresholds in home environment, and negotiate steps in household     Currently in Pain?  No/denies             [facility] PT Assessment - 04/09/20 1537      Berg Balance Test   Sit to Stand  Able to stand  independently using hands    Standing Unsupported  Able to stand 2 minutes with supervision    Sitting with Back Unsupported but Feet Supported on Floor or Stool  Able to sit safely and securely 2 minutes    Stand to Sit  Controls descent by using hands    Transfers  Able to transfer safely, definite need of hands    Standing Unsupported  with Eyes Closed  Able to stand 10 seconds with supervision    Standing Unsupported with Feet Together  Needs help to attain position but able to stand for 30 seconds with feet together    From Standing, Reach Forward with Outstretched Arm  Can reach forward >5 cm safely (2")    From Standing Position, Pick up Object from Floor  Able to pick up shoe, needs supervision    From Standing Position, Turn to Look Behind Over each Shoulder  Needs supervision when turning    Turn 360 Degrees  Needs assistance while turning    Standing Unsupported, Alternately Place Feet on Step/Stool  Able to complete >2 steps/needs minimal assist    Standing Unsupported, One Foot in [place] to take small step independently and hold 30 seconds    Standing on One Leg  Tries to lift leg/unable to hold 3  seconds but remains standing independently    Total Score  30          TREATMENT   Ther-ex Removed AFO's for exercises in // bars: Standing marching with BUE support in // bars x20bilateral; Mini squats in // bars with BUE supportx 20; Split squats with BUE support on // bars alternating forward LEx 20each; Updated outcome measures and goals with patient:   OUTCOME MEASURES: TEST 02/14/20 04/09/20 Interpretation  5 times sit<>stand 10.0s 7.4s >60 yo, >15 sec indicates increased risk for falls  10 meter walk test Self-selected: 18.1s = 0.55 m/s, Fastest: 15.8s = 0.63 m/s  Self-selected: 18.0s = 0.56 m/s, Fastest: 15.0s = 0.67  m/s  <1.0 m/s indicates increased risk for falls; limited community ambulator  Timed up and Go 14.9s 14.3s <14 sec indicates increased risk for falls  Berg Balance Assessment 30/56 30/56 <36/56 (100% risk for falls), 37-45 (80% risk for falls); 46-51 (>50% risk for falls); 52-55 (lower risk <25% of falls)  FOTO 53 53 Goal achieved     Neuromuscular Re-education  Forward/backward gait in // bars with BUE support 2 lengths eachdirection Sidestepping in // bars with BUE support x 2 lengths each direction; Static balance in // bars without UE supportin wide stancex 30s;   Pt educated throughout session about proper posture and technique with exercises. Improved exercise technique, movement at target joints, use of target muscles after min to mod verbal, visual, tactile cues.   Pt demonstrates excellent motivation during session today. She is able to complete all exercises as instructed today and continued in parallel bars without AFOs. Updated outcome measures and goals with patient today. Her TUG, BERG, 57mgait speed, and FOTO remained relatively consistent from the last time they were updated. Her 5TSTS improved from 10.0s to 7.4s. Pt shows nice maintenance of her functional outcome measures compared to the drop she experienced from a prolonged  absence out of therapy during the end of last year. Pt requires physical therapy intervention to maintain her functional status including LE strength, balance, and gait speed/quality. She will benefit from continued PT services to address these deficits and [facility]                  PT Short Term Goals - 04/09/20 1525      PT SHORT TERM GOAL #1   Title  Patient will report compliance with HEP for continued strengthening and stability during functional mobility.     Time  6    Period  Weeks    Status  On-going  Target Date  03/28/20        PT Long Term Goals - 04/09/20 1526      PT LONG TERM GOAL #1   Title  Patient will increase their FOTO score by >10 points to increase to 53% to demonstrate increased function and mobility for higher quality of life.    Baseline  11/20/19 42; 02/14/20: 53; 04/09/20: 53    Time  12    Period  Weeks    Status  Achieved      PT LONG TERM GOAL #2   Title  Patient will increase Berg Balance score by > 6 points (32/56) to demonstrate decreased fall risk during functional activities.    Baseline  1/6: 26/56; 02/14/20: 30/56; 04/09/20: 30/56    Time  12    Period  Weeks    Status  Partially Met    Target Date  05/08/20      PT LONG TERM GOAL #3   Title  Patient will be require no assist with ascend/descend flight of stairs using Least restrictive assistive device.    Baseline  1/6: step to pattern of ascend/descend with near falls; 02/14/20: step-to pattern with CGA; 04/09/20: step-to pattern with CGA    Time  12    Period  Weeks    Status  Partially Met    Target Date  05/08/20      PT LONG TERM GOAL #4   Title  Patient will complete a TUG test in < 12 seconds for independent mobility and decreased fall risk     Baseline  1/6: 16 seconds; 02/14/20: 14.9s; 04/09/20: 14.3s    Time  12    Period  Weeks    Status  Partially Met    Target Date  05/08/20      PT LONG TERM GOAL #5   Title  Patient will  increase fastest 10 meter walk test to >1.5ms as to improve gait speed for better community ambulation and to reduce fall risk.    Baseline  1/6: 0.55 m/s; 02/14/20: 0.63 m/s; 04/09/20: 0.67 m/s    Time  12    Period  Weeks    Status  Partially Met    Target Date  05/08/20            Plan - 04/09/20 1525    Clinical Impression Statement  Pt demonstrates excellent motivation during session today. She is able to complete all exercises as instructed today and continued in parallel bars without AFOs. Updated outcome measures and goals with patient today. Her TUG, BERG, 176mait speed, and FOTO remained relatively consistent from the last time they were updated. Her 5TSTS improved from 10.0s to 7.4s. Pt shows nice maintenance of her functional outcome measures compared to the drop she experienced from a prolonged absence out of therapy during the end of last year. Pt requires physical therapy intervention to maintain her functional status including LE strength, balance, and gait speed/quality. She will benefit from continued PT services to address these deficits and maintain her function at home and school.    Personal Factors and Comorbidities  Comorbidity 1;Past/Current Experience;Social Background;Time since onset of injury/illness/exacerbation;Fitness;Transportation    Comorbidities  spina bifida    Examination-Activity Limitations  Bend;Carry;Continence;Lift;Reach Overhead;Squat;Stairs    Examination-Participation Restrictions  Church;Community Activity;Driving;Laundry;Meal Prep;Volunteer;Yard Work    Stability/Clinical Decision Making  Stable/Uncomplicated    Rehab Potential  Good    PT Frequency  Biweekly    PT Duration  12 weeks  PT Treatment/Interventions  ADLs/Self Care Home Management;Aquatic Therapy;Biofeedback;Ultrasound;Moist Heat;Iontophoresis 57m/ml Dexamethasone;Electrical Stimulation;DME Instruction;Gait training;Stair training;Functional mobility training;Neuromuscular  re-education;Balance training;Therapeutic exercise;Therapeutic activities;Patient/family education;Orthotic Fit/Training;Wheelchair mobility training;Manual techniques;Dry needling;Passive range of motion;Energy conservation;Splinting;Taping;Vestibular;Visual/perceptual remediation/compensation    PT Next Visit Plan  continue balance and strengthening    PT Home Exercise Plan  Seated, supine, and core program: [email or web address]       Patient will benefit from skilled therapeutic intervention in order to improve the following deficits and impairments:  Abnormal gait, Decreased mobility, Decreased activity tolerance, Decreased balance, Decreased endurance, Decreased coordination, Decreased range of motion, Difficulty walking, Decreased strength, Impaired flexibility, Increased muscle spasms, Impaired tone, Postural dysfunction, Improper body mechanics  Visit Diagnosis: Muscle weakness (generalized)  Unsteadiness on feet     Problem List There are no problems to display for this patient.  JPhillips GroutPT, DPT, GCS  Demitris Pokorny 04/10/2020, 10:12 AM  [facility]MAIN [facility] [address] [address]Phone: [phone]  Fax:  [phone] Name: MSHENETTA SCHNACKENBERGMRN: [id number] of Birth: 9March 05, 1997

## 2020-04-10 ENCOUNTER — Ambulatory Visit: Payer: Medicaid Other

## 2020-04-24 ENCOUNTER — Other Ambulatory Visit: Payer: Self-pay

## 2020-04-24 ENCOUNTER — Ambulatory Visit: Payer: Medicaid Other | Attending: Student

## 2020-04-24 DIAGNOSIS — Z9181 History of falling: Secondary | ICD-10-CM | POA: Insufficient documentation

## 2020-04-24 DIAGNOSIS — R2681 Unsteadiness on feet: Secondary | ICD-10-CM | POA: Diagnosis present

## 2020-04-24 DIAGNOSIS — R2689 Other abnormalities of gait and mobility: Secondary | ICD-10-CM | POA: Insufficient documentation

## 2020-04-24 DIAGNOSIS — R531 Weakness: Secondary | ICD-10-CM | POA: Diagnosis present

## 2020-04-24 DIAGNOSIS — R278 Other lack of coordination: Secondary | ICD-10-CM | POA: Insufficient documentation

## 2020-04-24 DIAGNOSIS — R262 Difficulty in walking, not elsewhere classified: Secondary | ICD-10-CM | POA: Diagnosis present

## 2020-04-24 DIAGNOSIS — M6281 Muscle weakness (generalized): Secondary | ICD-10-CM | POA: Insufficient documentation

## 2020-04-24 DIAGNOSIS — R279 Unspecified lack of coordination: Secondary | ICD-10-CM | POA: Diagnosis present

## 2020-04-24 NOTE — Therapy (Signed)
[place] MAIN [facility] SERVICES [address] [place], [place], [address] Phone: [phone]   Fax:  [phone]  Physical Therapy Treatment  Patient Details  Name: Michelle Randall MRN: [id number] Date of Birth: 1996/07/29 Referring Provider (PT): Wayland Denis PA   Encounter Date: 04/24/2020   PT End of Session - 04/24/20 1658    Visit Number 11    Number of Visits 13    Date for PT Re-Evaluation 05/08/20    Authorization Type 27 per year allowed OT and PT combined    Authorization Time Period 12 visits 01/09/20-06/24/20    Authorization - Visit Number 8    Authorization - Number of Visits 12    PT Start Time 8280    PT Stop Time 1730    PT Time Calculation (min) 45 min    Equipment Utilized During Treatment Gait belt;Other (comment)   AFO   Activity Tolerance Patient tolerated treatment well;No increased pain    Behavior During Therapy WFL for tasks assessed/performed           Past Medical History:  Diagnosis Date  . Neurogenic bladder   . Neurogenic bowel   . S/P VP shunt   . Spina bifida [facility])     Past Surgical History:  Procedure Laterality Date  . VENTRICULOPERITONEAL SHUNT      There were no vitals filed for this visit.   Subjective Assessment - 04/24/20 1653    Subjective Pt reports that she is doing well today. No reports of pain. No falls since last therapy session. No specific questions or concerns.    Pertinent History Patient is a pleasant 24 year old female who presents for continuation of care for generalized weakness/ coordination deficits secondary to diagnosis of spina bifida. PMH includes VP shunt, spina bifida, and nuerogenic bowel/bladder. Patient was discharged from this clinic on 07/18/19 due to meeting therapy cap for insurance that year. Patient's request for a power chair was denied, is appealing the process. Has been doing the sitting exercises and laying down ones. Is biking when not cold outside. She  reports having multiple near falls when negotiating stairs at home. Patient must ascend/descend stairs to reach bedroom upstairs increasing her risk for falls daily.    Limitations Walking;Standing;House hold activities;Other (comment)    How long can you sit comfortably? n/a    How long can you stand comfortably? require use of bilateral crutches    How long can you walk comfortably? dependent upon terrain    Patient Stated Goals Patient wants to be able to negotiate uneven terrain at school, cross thresholds in home environment, and negotiate steps in household     Currently in Pain? No/denies                TREATMENT   Ther-ex Removed AFO's for exercises in // bars: Standing marching with BUE support in // bars x20bilateral; Mini squats in // bars without UE supportx 20; Split squats with BUE support on // bars alternating forward LEx 20each; Seated LAQ with manual resistance from therapist x 10 bilateral; Seated HS curls with red tband x 10 bilateral; Seated adductor squeeze with manual resistance from therapist x 10 bilateral; Sit to stand without UE support x 10; 6"forwardstep ups with BUE support alternating leading LE x 10 each;   Neuromuscular Re-education Standing ball kicks in // bars with BUE support x multiple bouts on each leg; Forward/backward gait in // bars with BUE support 2 lengths eachdirection  Sidestepping in // bars with BUE support x 2 lengths each direction; Static balance in // bars without UE supportin wide and narrow stancex 30s each, also incorporated dynamic reaching to challenge balance; Airex balanceWBOSin // bars without UE support x 30s; 2" alternating step taps with BUE support x 10 each;   Pt educated throughout session about proper posture and technique with exercises. Improved exercise technique, movement at target joints, use of target muscles after min to mod verbal, visual, tactile cues.   Pt demonstrates  excellent motivation during session today. She is able to complete all exercises as instructed today and is able to resumestrengthening and balance exercises.Session focused on exercises in the // bars todaywith a mixture of strength and balance exercises. Utilized bilateral and unilateral support for standing exercises.AFOs removed during session to challenge ankle stability.She requires verbal and tactile cues as well as assist for exercises today and would be unable to complete all of her exercises without the direction of a physical therapist.Pt will benefit from PT services to address deficits in strength, balance, and mobility in order to return to full function at [employer] school.                          PT Short Term Goals - 04/09/20 1525      PT SHORT TERM GOAL #1   Title Patient will report compliance with HEP for continued strengthening and stability during functional mobility.     Time 6    Period Weeks    Status On-going    Target Date 03/28/20             PT Long Term Goals - 04/09/20 1526      PT LONG TERM GOAL #1   Title Patient will increase their FOTO score by >10 points to increase to 53% to demonstrate increased function and mobility for higher quality of life.    Baseline 11/20/19 42; 02/14/20: 53; 04/09/20: 53    Time 12    Period Weeks    Status Achieved      PT LONG TERM GOAL #2   Title Patient will increase Berg Balance score by > 6 points (32/56) to demonstrate decreased fall risk during functional activities.    Baseline 1/6: 26/56; 02/14/20: 30/56; 04/09/20: 30/56    Time 12    Period Weeks    Status Partially Met    Target Date 05/08/20      PT LONG TERM GOAL #3   Title Patient will be require no assist with ascend/descend flight of stairs using Least restrictive assistive device.    Baseline 1/6: step to pattern of ascend/descend with near falls; 02/14/20: step-to pattern with CGA; 04/09/20: step-to pattern with CGA    Time 12      Period Weeks    Status Partially Met    Target Date 05/08/20      PT LONG TERM GOAL #4   Title Patient will complete a TUG test in < 12 seconds for independent mobility and decreased fall risk     Baseline 1/6: 16 seconds; 02/14/20: 14.9s; 04/09/20: 14.3s    Time 12    Period Weeks    Status Partially Met    Target Date 05/08/20      PT LONG TERM GOAL #5   Title Patient will increase fastest 10 meter walk test to >1.54ms as to improve gait speed for better community ambulation and to reduce fall risk.  Baseline 1/6: 0.55 m/s; 02/14/20: 0.63 m/s; 04/09/20: 0.67 m/s    Time 12    Period Weeks    Status Partially Met    Target Date 05/08/20                 Plan - 04/24/20 1659    Clinical Impression Statement Pt demonstrates excellent motivation during session today. She is able to complete all exercises as instructed today and is able to resume strengthening and balance exercises. Session focused on exercises in the // bars today with a mixture of strength and balance exercises. Utilized bilateral and unilateral support for standing exercises. AFOs removed during session to challenge ankle stability. She requires verbal and tactile cues as well as assist for exercises today and would be unable to complete all of her exercises without the direction of a physical therapist. Pt will benefit from PT services to address deficits in strength, balance, and mobility in order to return to full function at home and school.    Personal Factors and Comorbidities Comorbidity 1;Past/Current Experience;Social Background;Time since onset of injury/illness/exacerbation;Fitness;Transportation    Comorbidities spina bifida    Examination-Activity Limitations Bend;Carry;Continence;Lift;Reach Overhead;Squat;Stairs    Examination-Participation Restrictions Church;Community Activity;Driving;Laundry;Meal Prep;Volunteer;Yard Work    Stability/Clinical Decision Making Stable/Uncomplicated    Rehab Potential  Good    PT Frequency Biweekly    PT Duration 12 weeks    PT Treatment/Interventions ADLs/Self Care Home Management;Aquatic Therapy;Biofeedback;Ultrasound;Moist Heat;Iontophoresis 71m/ml Dexamethasone;Electrical Stimulation;DME Instruction;Gait training;Stair training;Functional mobility training;Neuromuscular re-education;Balance training;Therapeutic exercise;Therapeutic activities;Patient/family education;Orthotic Fit/Training;Wheelchair mobility training;Manual techniques;Dry needling;Passive range of motion;Energy conservation;Splinting;Taping;Vestibular;Visual/perceptual remediation/compensation    PT Next Visit Plan continue balance and strengthening    PT Home Exercise Plan Seated, supine, and core program: [id number]           Patient will benefit from skilled therapeutic intervention in order to improve the following deficits and impairments:  Abnormal gait, Decreased mobility, Decreased activity tolerance, Decreased balance, Decreased endurance, Decreased coordination, Decreased range of motion, Difficulty walking, Decreased strength, Impaired flexibility, Increased muscle spasms, Impaired tone, Postural dysfunction, Improper body mechanics  Visit Diagnosis: Muscle weakness (generalized)  Unsteadiness on feet  Difficulty in walking, not elsewhere classified     Problem List There are no problems to display for this patient.  JPhillips GroutPT, DPT, GCS  Huprich,Jason 04/25/2020, 8:43 AM  CDeWittMAIN [facility] CenterSERVICES [address] [address]Phone: [phone]  Fax:  [phone] Name: Michelle PECOREMRN: [id number] of Birth: [birth year]

## 2020-05-05 IMAGING — CT CT HEAD W/O CM
3 series · 15 of 46 positions shown, 18 images · non-contrast
Comparison: None.

CLINICAL DATA: Headache, recently diagnosed with dehydration.
History of VP shunt.

EXAM:
CT HEAD WITHOUT CONTRAST
TECHNIQUE: Contiguous axial images were obtained from the base of the skull
through the vertex without intravenous contrast.

[Series 3: head wo · axial · 0.41mm/px · z∈[+413,+533]mm · 9 of 29 slices shown, 12 images]
[im 3/29  brain]
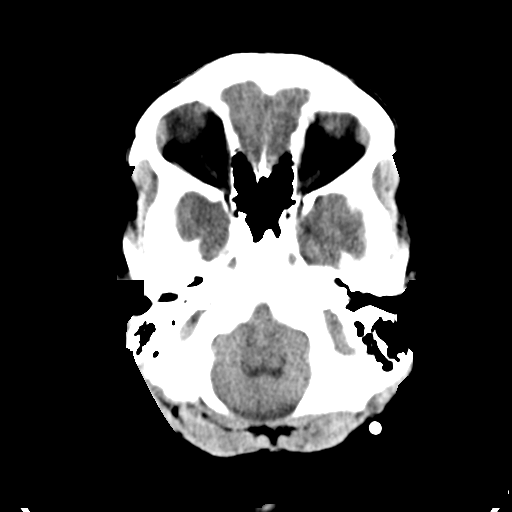
[im 3/29  bone]
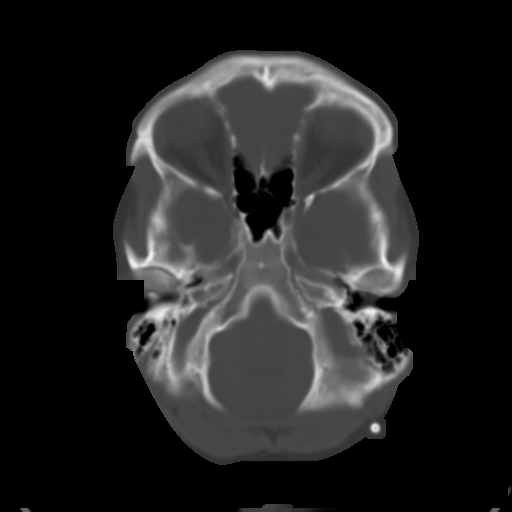
[im 6/29  brain]
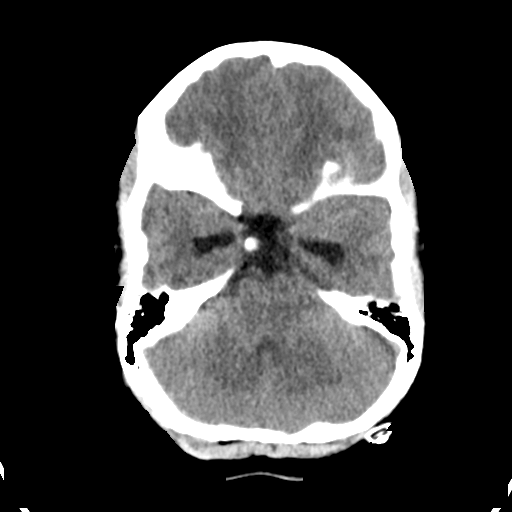
[im 9/29  brain]
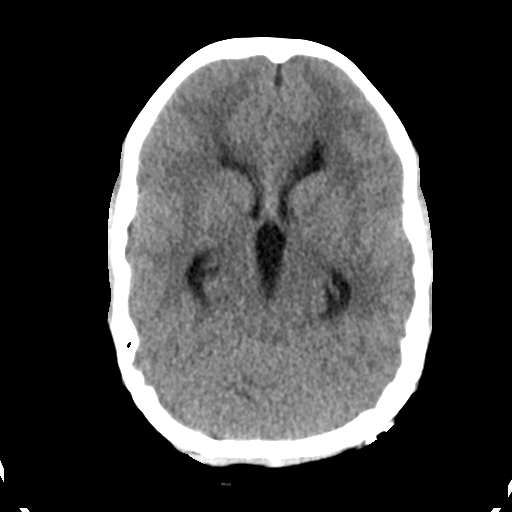
[im 12/29  brain]
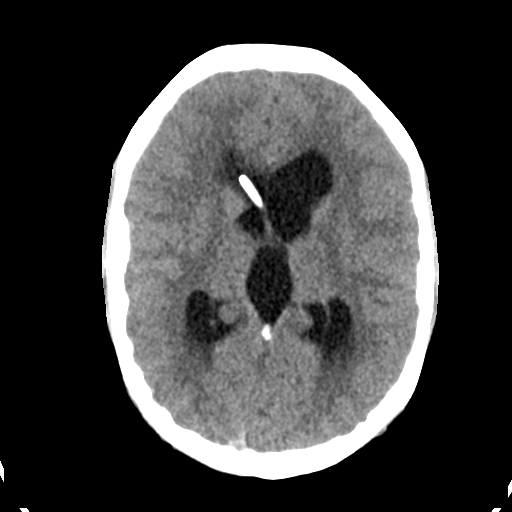
[im 15/29  brain]
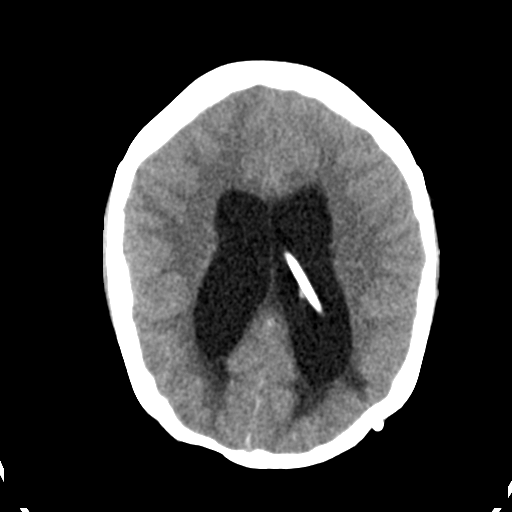
[im 15/29  bone]
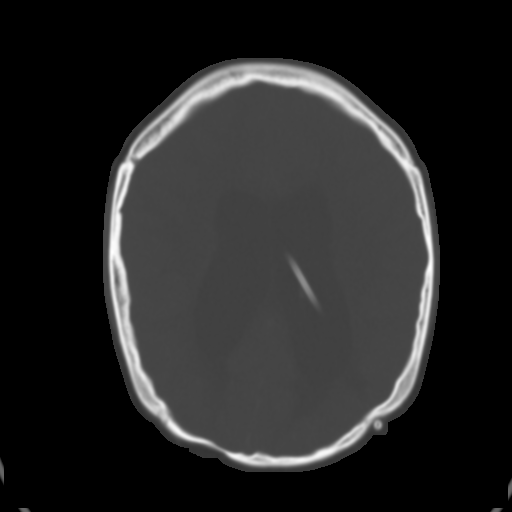
[im 18/29  brain]
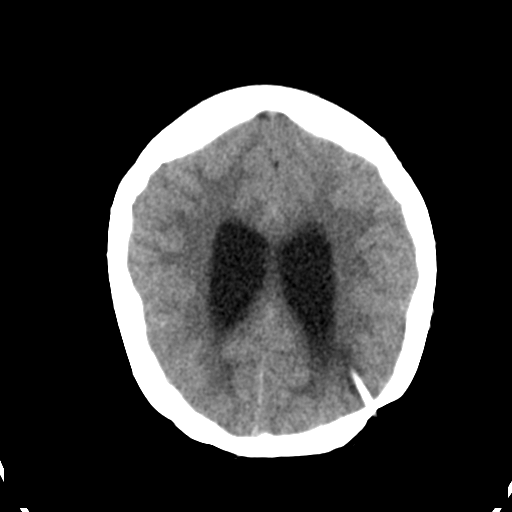
[im 21/29  brain]
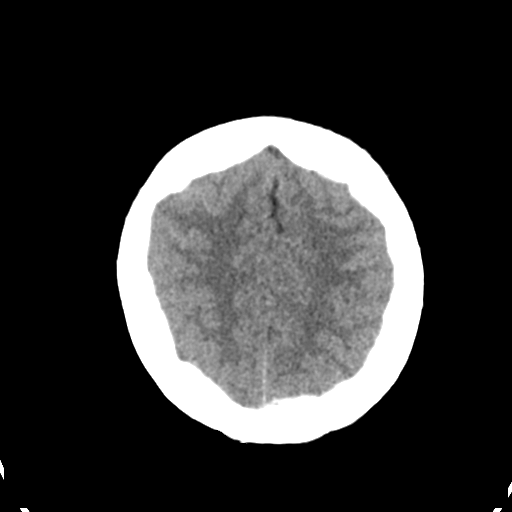
[im 24/29  brain]
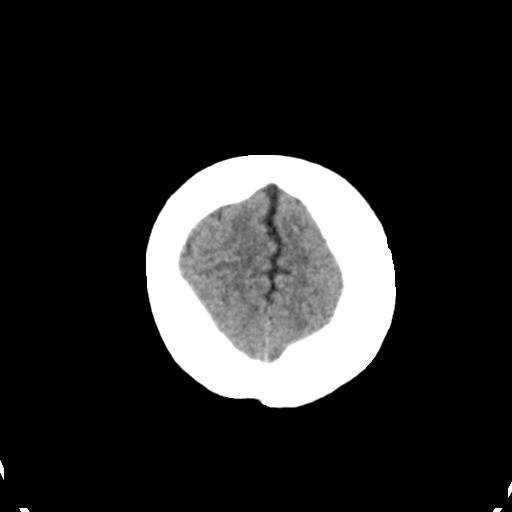
[im 27/29  brain]
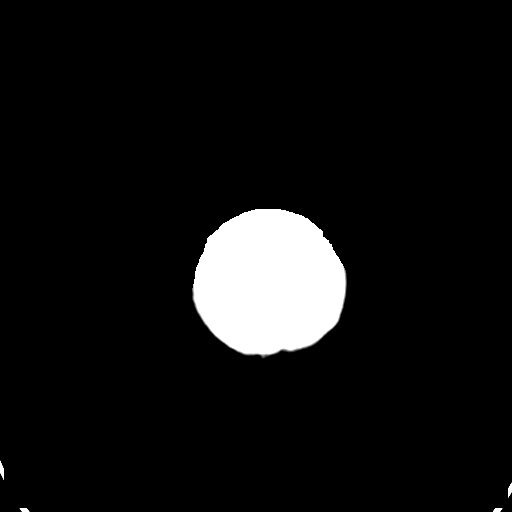
[im 27/29  bone]
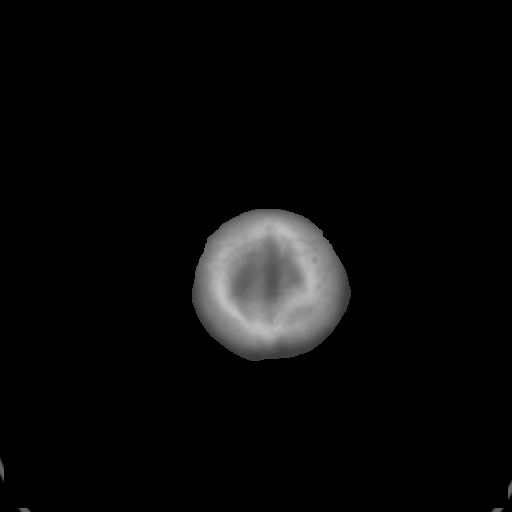

[Series 4: coronal soft tissue · coronal · 0.28mm/px · 3 of 62 slices shown]
[im 21/62  brain]
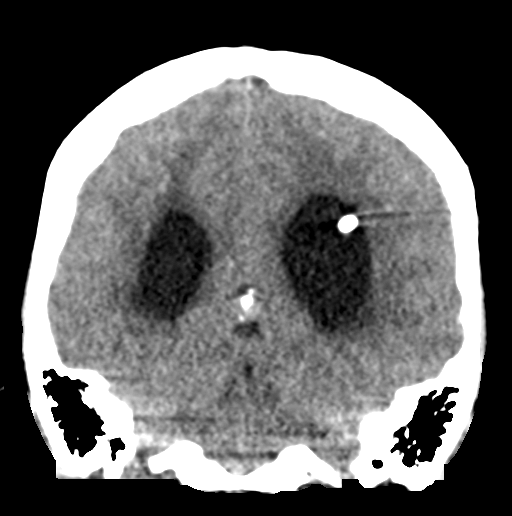
[im 28/62  brain]
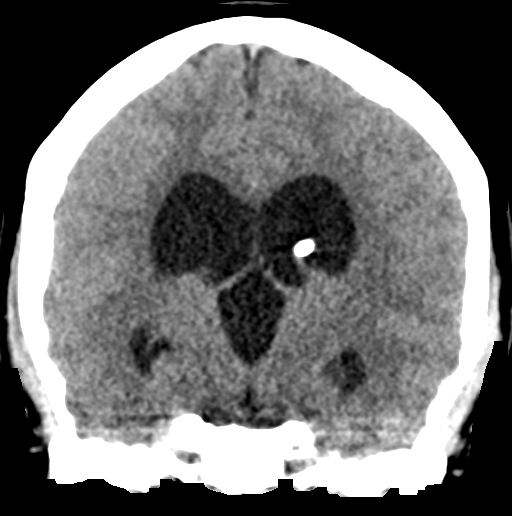
[im 34/62  brain]
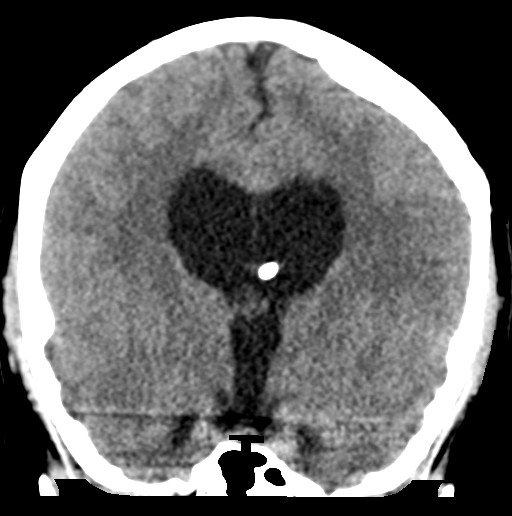

[Series 5: sagittal soft tissue · sagittal · 0.29mm/px · 3 of 49 slices shown]
[im 17/49  brain]
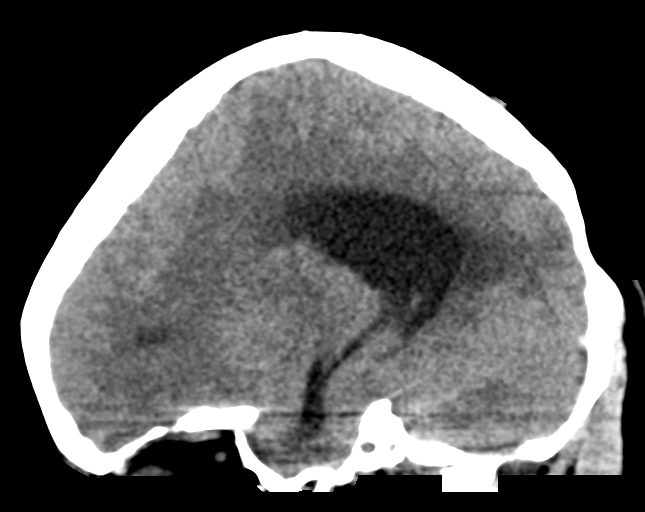
[im 25/49  brain]
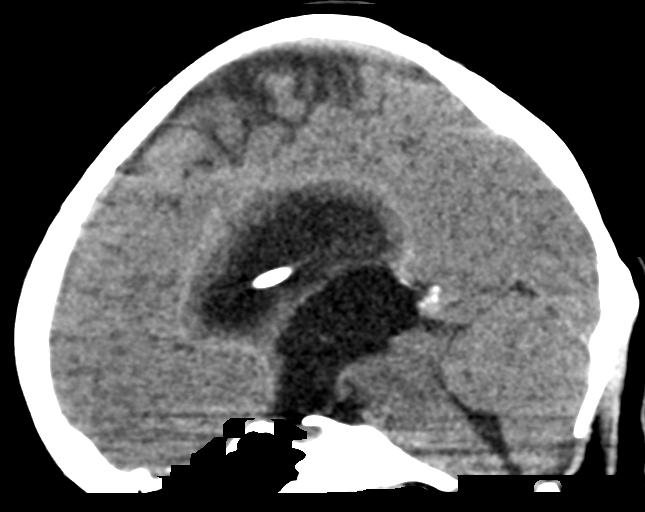
[im 33/49  brain]
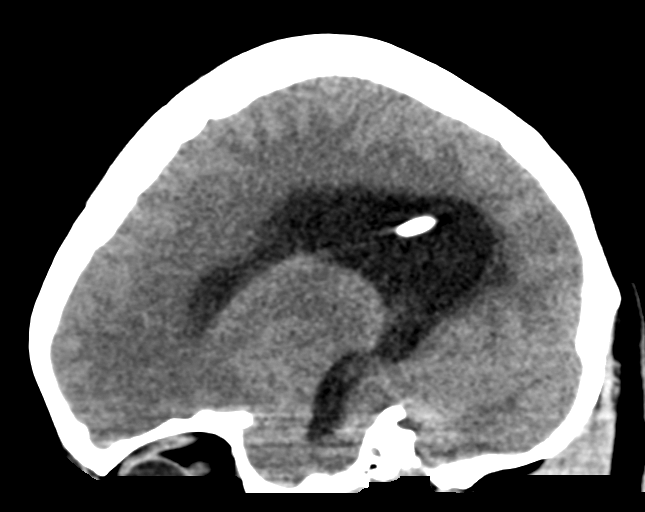

[15 of 46 positions shown; findings below may reference images not displayed]

FINDINGS: BRAIN: Severe ventriculomegaly, periventricular interstitial edema.
Ventriculoperitoneal shunt via LEFT parietal burr hole with distal
tip RIGHT frontal horn of the lateral ventricle. Low signal about
the catheter tract. Diffuse sulcal effacement. No midline shift,
acute large vascular territory infarct or hemorrhage. Downward
cerebellar tonsillar herniation. Effaced basal/prepontine cisterns.

VASCULAR: Unremarkable.

SKULL/SOFT TISSUES: No skull fracture. No significant soft tissue
swelling. Discontinuous ventriculoperitoneal shunt catheter above
reservoir.

ORBITS/SINUSES: The included ocular globes and orbital contents are
normal.Trace paranasal sinus mucosal thickening. Mastoid air cells
are well aerated.

OTHER: None.
IMPRESSION: 1. Severe hydrocephalus with interstitial edema/transependymal flow
backing along the catheter tract, LEFT VP shunt in place.
2. VP shunt is discontinuous above reservoir.
3. Cerebral edema, effaced cisterns with inferior cerebellar
tonsillar herniation.
4. Critical Value/emergent results were called by telephone at the
time of interpretation on 07/17/2018 at [DATE] to Dr. JII-PEE TROBERG
, who verbally acknowledged these results.

## 2020-05-05 IMAGING — CR DG CERVICAL SPINE 1V
1 series · 2 of 2 positions shown · non-contrast
Comparison: Head CT dated 07/17/2018

CLINICAL DATA: 21-year-old female.  Shunt evaluation.

EXAM:
SKULL - 1-3 VIEW; ABDOMEN - 1 VIEW; CHEST 1 VIEW; DG CERVICAL SPINE
- 1 VIEW

[Series 1: dg cervical spine 1 view · 0.14mm/px · 2 of 2 slices shown]
[im 1/2]
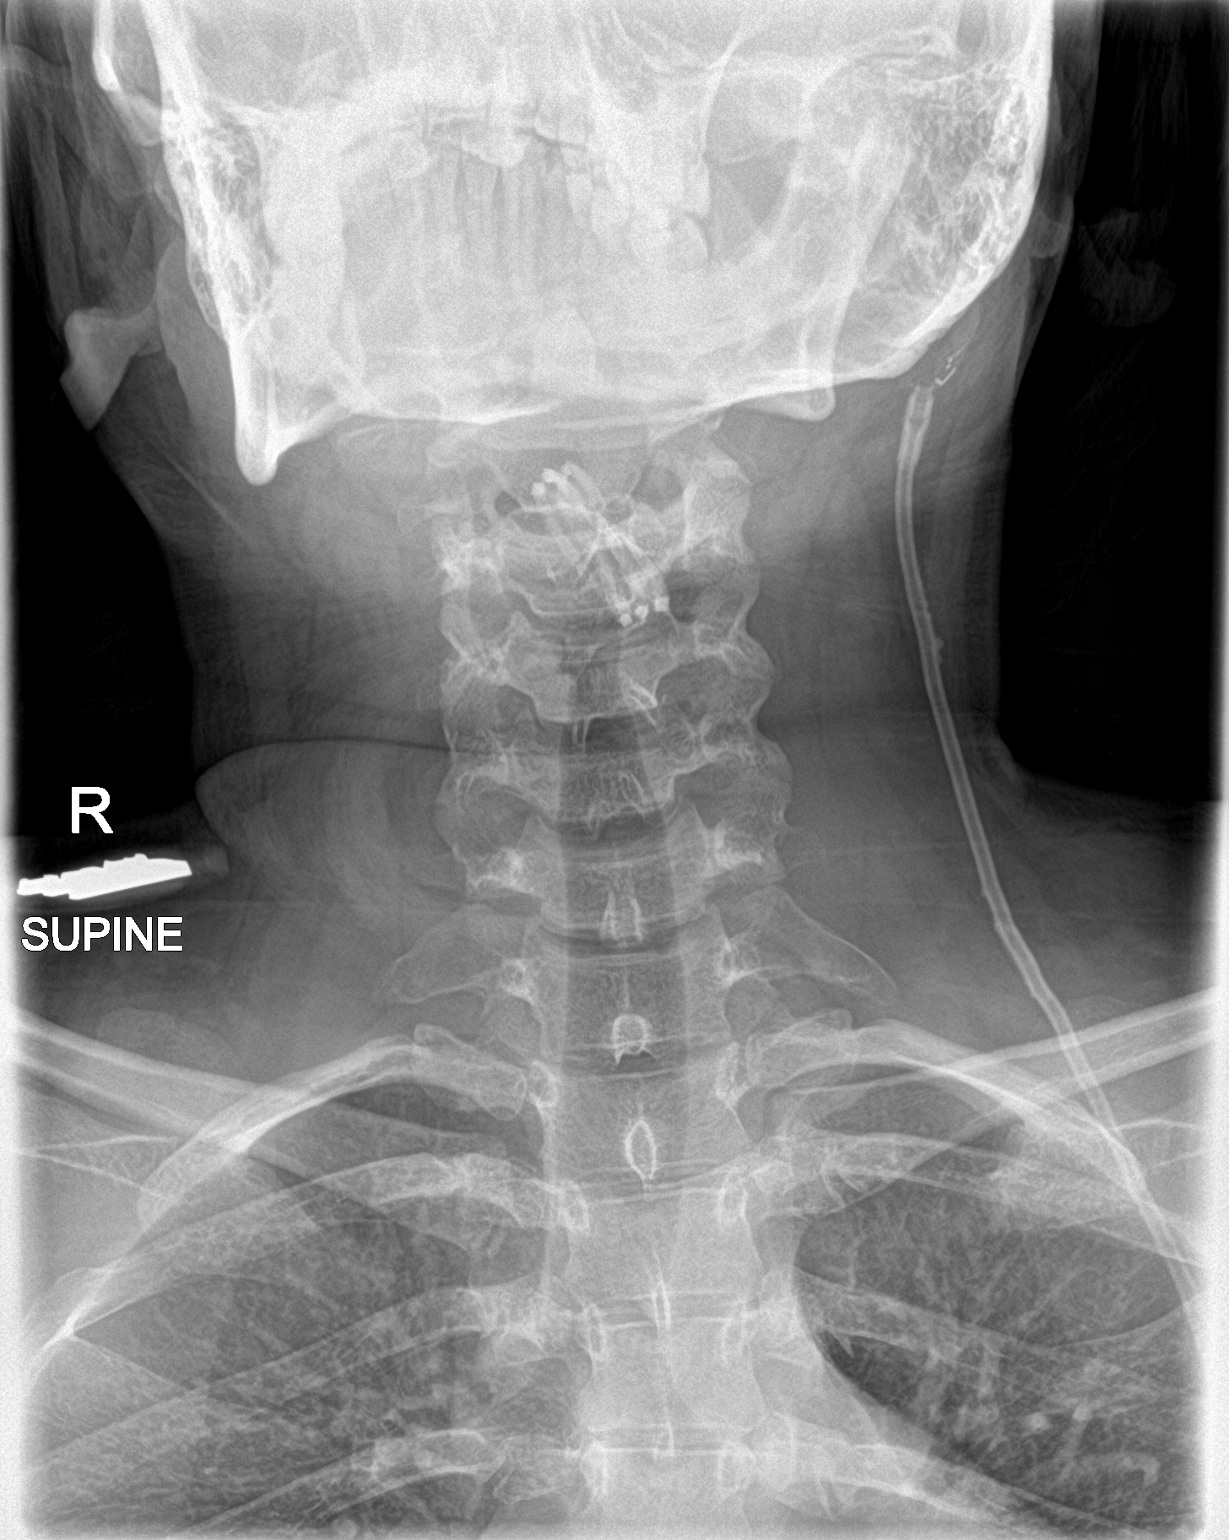
[im 2/2]
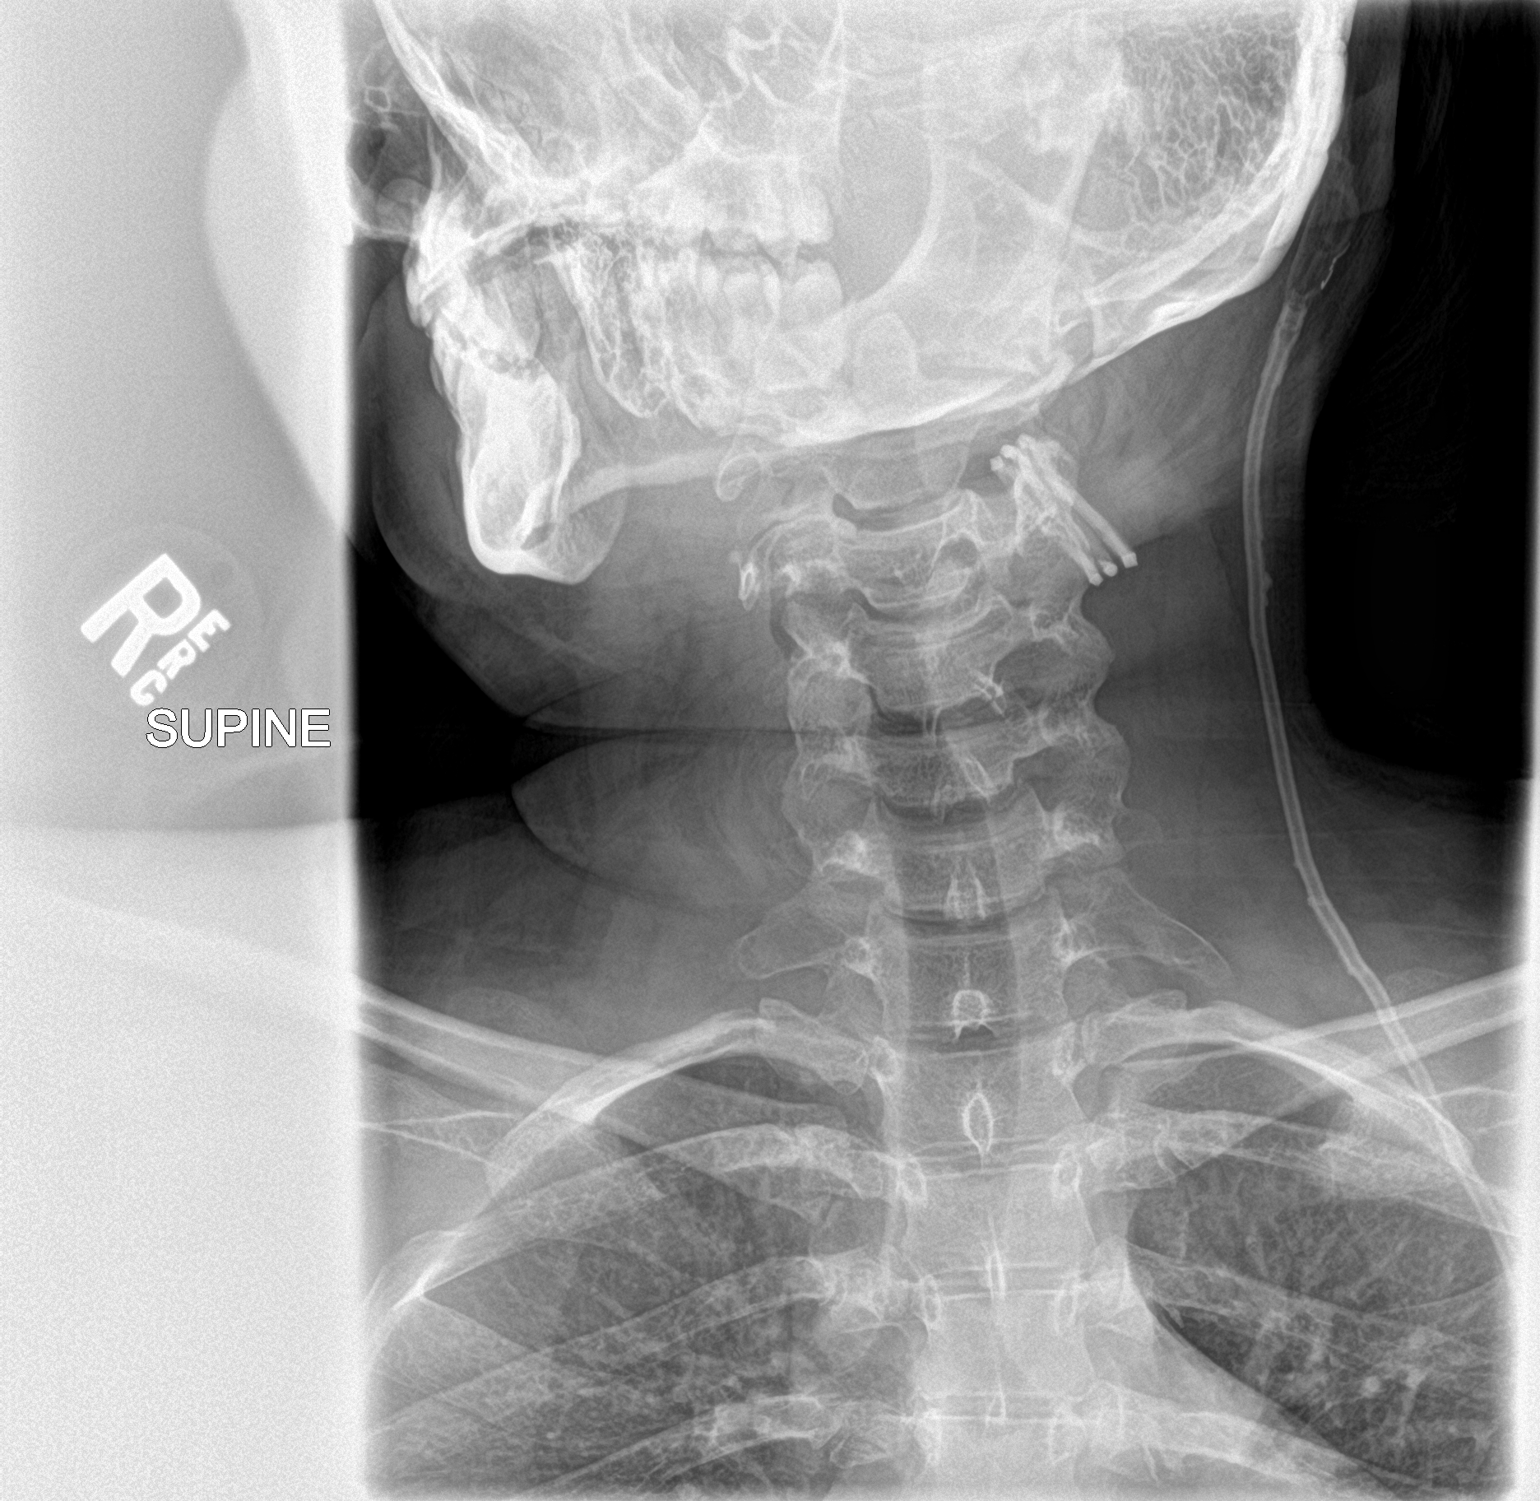

[2 of 2 positions shown; findings below may reference images not displayed]

FINDINGS: Left posterior parietal approach ventriculostomy shunt with tip to
the right of the midline as seen in the frontal horn of the right
lateral ventricle on the earlier CT. There is approximately 5 cm
segment of discontinuity of the shunt over the occiput. The shunt
extends inferiorly along the left lateral neck and left chest and
left upper abdomen. The shunt tube crosses over to the right
hemiabdomen at the level of L1-L2 and extends into the pelvis. The
tip of the shunt is located in the right hemipelvis. The tubing
inferior to the reservoir is intact. Mild bend in the distal tube
within the pelvis without sharp kink.

There is no acute cardiopulmonary process. No bowel dilatation or
evidence of obstruction. Chronic deformity of the right hip with
mild subluxation and uncovering of the lateral femoral head. No
acute osseous pathology.
IMPRESSION: Discontinuity of the shunt above the reservoir over the left
occiput.

## 2020-05-07 ENCOUNTER — Ambulatory Visit: Payer: Medicaid Other

## 2020-05-07 ENCOUNTER — Other Ambulatory Visit: Payer: Self-pay

## 2020-05-07 ENCOUNTER — Ambulatory Visit: Payer: Medicaid Other | Admitting: Occupational Therapy

## 2020-05-07 ENCOUNTER — Encounter: Payer: Self-pay | Admitting: Occupational Therapy

## 2020-05-07 DIAGNOSIS — R2689 Other abnormalities of gait and mobility: Secondary | ICD-10-CM

## 2020-05-07 DIAGNOSIS — R531 Weakness: Secondary | ICD-10-CM

## 2020-05-07 DIAGNOSIS — R2681 Unsteadiness on feet: Secondary | ICD-10-CM

## 2020-05-07 DIAGNOSIS — R278 Other lack of coordination: Secondary | ICD-10-CM

## 2020-05-07 DIAGNOSIS — M6281 Muscle weakness (generalized): Secondary | ICD-10-CM

## 2020-05-07 DIAGNOSIS — Z9181 History of falling: Secondary | ICD-10-CM

## 2020-05-07 DIAGNOSIS — R262 Difficulty in walking, not elsewhere classified: Secondary | ICD-10-CM

## 2020-05-07 DIAGNOSIS — R279 Unspecified lack of coordination: Secondary | ICD-10-CM

## 2020-05-07 NOTE — Therapy (Signed)
[place] MAIN [facility] SERVICES [address] [place], [place], [address] Phone: [phone]   Fax:  [phone]  Occupational Therapy Treatment/Discharge Note  Patient Details  Name: Michelle Randall MRN: [id number] Date of Birth: [birth year] No data recorded  Encounter Date: 05/07/2020   OT End of Session - 05/07/20 1625    Visit Number 6    Number of Visits 6    Date for OT Re-Evaluation 05/22/20    Authorization Type Medicaid    OT Start Time 1604    OT Stop Time 1645    OT Time Calculation (min) 41 min    Activity Tolerance Patient tolerated treatment well    Behavior During Therapy [facility] for tasks assessed/performed           Past Medical History:  Diagnosis Date  . Neurogenic bladder   . Neurogenic bowel   . S/P VP shunt   . Spina bifida [facility])     Past Surgical History:  Procedure Laterality Date  . VENTRICULOPERITONEAL SHUNT      There were no vitals filed for this visit.   Subjective Assessment - 05/07/20 1623    Subjective  Today is the patient's 6th visit    Patient is accompanied by: Family member    Pertinent History Pt. is a 24 y.o. female who who was diagnosed with Spina Bifida. 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 Pt. is taking online courses through [employer] until she can resume classes at [employer] once the pandemic is over. The patient is working towards pursuing a degree in Occupational Therapy. Pt. has experience a decline in UE strength, [place] skills, ADL,a nd IADL functioning. Pt. was working as a Writer prior to the pandemic, and has note been able to work since.    Patient Stated Goals To be as independent as possible, and transition to independent living.    Currently in Pain? No/denies         OT TREATMENT   Neuro muscular re-education:  Pt. worked on [facility] skills grasping 1" sticks, 1/4" collars, and 1/4" washers. Pt. worked on storing the objects in herpalm, and translatory skills  moving the items from the palm of the hand to the tip of the 2nd digit, and thumb.  Pt. removed the items alternating thumb opposition to the tip of her 2nd digit to thumb.   Therapeutic Exercise:  Pt. performedbilateralgross gripping with grip strengthener.The gripper was positioned 17.9# of grip strength force. Pt. worked on placing the clips at various vertical and horizontal angles. Pt. worked on using the Hydrographic surveyor for Bilateral grip strength: R:31.6#, 32.6#, 29.8#, 32.8# L: 28.6#, 27.8#, 32.8#, 29.2#  Response to Treatment:  Pt. has made progress overall with BUE strength, grip strength, and [place] skills.Pt.continues to tolerate grip strengthening, and [place] and [place] tasks wellwith her bilateral hands.Pt.  has progressed with using her hands more to help at home during ADL, and IADL tasks. Pt. Is able to use cooking utensils, fold grocery bags, and use the lemon press. Pt. continues to have difficulty with dressing immediately after getting out of the shower, buttoning, and making her bed. Pt.'s OT visit limit has been reached for the year. OT services are now discharged.                        OT Education - 05/07/20 1625    Education provided Yes    Education Details [facility] skills, UE  strength, POC goals    Person(s) Educated Patient    Methods Explanation    Comprehension Verbalized understanding               OT Long Term Goals - 02/28/20 1622      OT LONG TERM GOAL #1   Title Patient will demonstrate increased strength in right hand to be able to open containers, jars, and manage kitchen utensils.    Baseline Bilateral grip strength has improved.  Pt. conitnues to have difficulty opening jars, containers, and kitchen utensils    Time 12    Period Weeks    Status On-going    Target Date 05/22/20      OT LONG TERM GOAL #2   Title Patient will be able to make her bed with modified independence.    Baseline Pt. has difficulty  completing efficiently.    Time 12    Period Weeks    Status New    Target Date 05/22/20      OT LONG TERM GOAL #3   Title Pt. will perform LE dressing efficiently with modified independence    Baseline Pt. has is able to manage looser fitting slothing, however has difficulty with tighter fitting clothing.    Time 12    Period Weeks    Status On-going    Target Date 05/22/20      OT LONG TERM GOAL #4   Title Pt. will improve BUE strength by 1 mm grade to be able to perform home management tasks.    Baseline BUE strength 5/5 overall. Pt. continues to assist to perform home management tasks.    Time 12    Period Weeks    Status On-going    Target Date 05/22/20      OT LONG TERM GOAL #5   Title Pt. will improve Bilateral [facility] skills in order to be able to independently, and efficiently button a shirt.    Baseline Pt.presents with decreased biliateral [facility] skills requiring increased time to complete buttoning.    Time 12    Period Weeks    Status Partially Met    Target Date 05/22/20                 Plan - 05/07/20 1627    Clinical Impression Statement Pt. has made progress overall with BUE strength, grip strength, and [place] skills.Pt.continues to tolerate grip strengthening, and [place] and [place] tasks wellwith her bilateral hands.Pt.  has progressed with using her hands more to help at home during ADL, and IADL tasks. Pt. Is able to use cooking utensils, fold grocery bags, and use the lemon press. Pt. continues to have difficulty with dressing immediately after getting out of the shower, buttoning, and making her bed. Pt.'s OT visit limit has been reached for the year. OT services are now discharged.   Occupational Profile and client history currently impacting functional performance decreased hand strength, balance and coordination.    Occupational performance deficits (Please refer to evaluation for details): ADL's;IADL's;Leisure;Education    Body Structure / Function / Physical  Skills ADL;Coordination;FMC;Strength;IADL;UE functional use    Rehab Potential Good    Clinical Decision Making Several treatment options, min-mod task modification necessary    Comorbidities Affecting Occupational Performance: May have comorbidities impacting occupational performance    Modification or Assistance to Complete Evaluation  Min-Moderate modification of tasks or assist with assess necessary to complete eval    OT Frequency Monthly    OT Duration 12 weeks  OT Treatment/Interventions Self-care/ADL training;DME and/or AE instruction;Therapeutic activities;Therapeutic exercise;Neuromuscular education;Patient/family education    Consulted and Agree with Plan of Care Patient           Patient will benefit from skilled therapeutic intervention in order to improve the following deficits and impairments:   Body Structure / Function / Physical Skills: ADL, Coordination, FMC, Strength, IADL, UE functional use       Visit Diagnosis: Muscle weakness (generalized)  Other lack of coordination    Problem List There are no problems to display for this patient.   Harrel Carina, MS, OTR/L 05/07/2020, 4:33 PM  [place] MAIN [facility] SERVICES [address] [place], [place], [address] Phone: [phone]   Fax:  [phone]  Name: Michelle Randall MRN: [id number] Date of Birth: 08/18/1996

## 2020-05-07 NOTE — Therapy (Signed)
[place] MAIN [facility] SERVICES [address] [place], [place], [address] Phone: [phone]   Fax:  [phone]  Physical Therapy Treatment  Patient Details  Name: Michelle Randall MRN: [id number] Date of Birth: 05-Aug-1996 Referring Provider (PT): Wayland Denis PA   Encounter Date: 05/07/2020   PT End of Session - 05/07/20 1526    Visit Number 12    Number of Visits 13    Date for PT Re-Evaluation 05/08/20    Authorization Type 27 per year allowed OT and PT combined    Authorization Time Period 12 visits 01/09/20-06/24/20    Authorization - Visit Number 9    Authorization - Number of Visits 12    PT Start Time 5631    PT Stop Time 1558    PT Time Calculation (min) 40 min    Equipment Utilized During Treatment Gait belt;Other (comment)    Activity Tolerance Patient tolerated treatment well;No increased pain    Behavior During Therapy WFL for tasks assessed/performed           Past Medical History:  Diagnosis Date  . Neurogenic bladder   . Neurogenic bowel   . S/P VP shunt   . Spina bifida [facility])     Past Surgical History:  Procedure Laterality Date  . VENTRICULOPERITONEAL SHUNT      There were no vitals filed for this visit.   Subjective Assessment - 05/07/20 1521    Subjective Pt doing well, no major updates. No falls since last visit. Still working on getting her new power WC situated. Classes are going well.    Pertinent History Patient is a pleasant 24 year old female who presents for continuation of care for generalized weakness/ coordination deficits secondary to diagnosis of spina bifida. PMH includes VP shunt, spina bifida, and nuerogenic bowel/bladder. Patient was discharged from this clinic on 07/18/19 due to meeting therapy cap for insurance that year. Patient's request for a power chair was denied, is appealing the process. Has been doing the sitting exercises and laying down ones. Is biking when not cold outside. She  reports having multiple near falls when negotiating stairs at home. Patient must ascend/descend stairs to reach bedroom upstairs increasing her risk for falls daily.    Limitations Walking;Standing;House hold activities;Other (comment)    How long can you sit comfortably? n/a    How long can you stand comfortably? require use of bilateral lofstrand crutches    How long can you walk comfortably? dependent upon terrain    Patient Stated Goals Patient wants to be able to negotiate uneven terrain at school, cross thresholds in home environment, and negotiate steps in household     Currently in Pain? No/denies           INTERVENTION THIS DATE Removed AFO's for exercises in // bars  Ther-ex  Standing marching with BUE support in // bars x 20 bilateral; Mini squats in // bars without UE support x 20; Seated HS curls on cable machine 5lb, 1x10bilat c author assist, then 1x10 c 3lb assist at ankle Seated LAQ 2lbAW 1x10 bilat (full TKE ROM appreciated) Split squats with BUE support on // bars alternating forward LE x 20 each; Sit to stand without UE support x 10  Neuromuscular Re-education  Forward/backward gait in // bars with BUE support 2 lengths each direction Sidestepping in // bars with BUE support x 2 lengths each direction;     PT Short Term Goals - 04/09/20 1525  PT SHORT TERM GOAL #1   Title Patient will report compliance with HEP for continued strengthening and stability during functional mobility.     Time 6    Period Weeks    Status On-going    Target Date 03/28/20             PT Long Term Goals - 04/09/20 1526      PT LONG TERM GOAL #1   Title Patient will increase their FOTO score by >10 points to increase to 53% to demonstrate increased function and mobility for higher quality of life.    Baseline 11/20/19 42; 02/14/20: 53; 04/09/20: 53    Time 12    Period Weeks    Status Achieved      PT LONG TERM GOAL #2   Title Patient will increase Berg Balance score by >  6 points (32/56) to demonstrate decreased fall risk during functional activities.    Baseline 1/6: 26/56; 02/14/20: 30/56; 04/09/20: 30/56    Time 12    Period Weeks    Status Partially Met    Target Date 05/08/20      PT LONG TERM GOAL #3   Title Patient will be require no assist with ascend/descend flight of stairs using Least restrictive assistive device.    Baseline 1/6: step to pattern of ascend/descend with near falls; 02/14/20: step-to pattern with CGA; 04/09/20: step-to pattern with CGA    Time 12    Period Weeks    Status Partially Met    Target Date 05/08/20      PT LONG TERM GOAL #4   Title Patient will complete a TUG test in < 12 seconds for independent mobility and decreased fall risk     Baseline 1/6: 16 seconds; 02/14/20: 14.9s; 04/09/20: 14.3s    Time 12    Period Weeks    Status Partially Met    Target Date 05/08/20      PT LONG TERM GOAL #5   Title Patient will increase fastest 10 meter walk test to >1.0m/s as to improve gait speed for better community ambulation and to reduce fall risk.    Baseline 1/6: 0.55 m/s; 02/14/20: 0.63 m/s; 04/09/20: 0.67 m/s    Time 12    Period Weeks    Status Partially Met    Target Date 05/08/20                 Plan - 05/07/20 1530    Clinical Impression Statement Continued with current POC. Started session with isolated single plane open chain resistance training. Played around with higher intensity loading at hamstrings and quads with shorter rep sets which pt tolerates well. Ended with neuromotor retraining with multiplane gait in //bars. Pt able to complete entire session as planned with rest breaks provided as needed. Pt maintains high level of focus and motivation. Extensive verbal, visual, and tactile cues are provided for most accurate form possible. Author provides minA intermittently for full ROM when needed. Overall pt continues to make steady progress toward treatment goals.    Personal Factors and Comorbidities Comorbidity  1;Past/Current Experience;Social Background;Time since onset of injury/illness/exacerbation;Fitness;Transportation    Comorbidities spina bifida    Examination-Activity Limitations Bend;Carry;Continence;Lift;Reach Overhead;Squat;Stairs    Examination-Participation Restrictions Church;Community Activity;Driving;Laundry;Meal Prep;Volunteer;Yard Work    Stability/Clinical Decision Making Stable/Uncomplicated    Clinical Decision Making Low    Rehab Potential Good    Clinical Impairments Affecting Rehab Potential weakness and decreased standing balance    PT Frequency Biweekly      PT Duration 12 weeks    PT Treatment/Interventions ADLs/Self Care Home Management;Aquatic Therapy;Biofeedback;Ultrasound;Moist Heat;Iontophoresis 30m/ml Dexamethasone;Electrical Stimulation;DME Instruction;Gait training;Stair training;Functional mobility training;Neuromuscular re-education;Balance training;Therapeutic exercise;Therapeutic activities;Patient/family education;Orthotic Fit/Training;Wheelchair mobility training;Manual techniques;Dry needling;Passive range of motion;Energy conservation;Splinting;Taping;Vestibular;Visual/perceptual remediation/compensation    PT Next Visit Plan continue balance and strengthening    PT Home Exercise Plan Seated, supine, and core program: [id number]    Consulted and Agree with Plan of Care Patient           Patient will benefit from skilled therapeutic intervention in order to improve the following deficits and impairments:  Abnormal gait, Decreased mobility, Decreased activity tolerance, Decreased balance, Decreased endurance, Decreased coordination, Decreased range of motion, Difficulty walking, Decreased strength, Impaired flexibility, Increased muscle spasms, Impaired tone, Postural dysfunction, Improper body mechanics  Visit Diagnosis: Muscle weakness (generalized)  Unsteadiness on feet  Difficulty in walking, not elsewhere classified  Weakness generalized  Other lack  of coordination  Other abnormalities of gait and mobility  Personal history of fall  Lack of coordination     Problem List There are no problems to display for this patient.  4:02 PM, 05/07/20 AEtta Grandchild PT, DPT Physical Therapist - [phone]   BEtta Grandchild6/23/2021, 3:53 PM  [place] [address]Phone: [phone]  Fax:  [phone] Name: Michelle VEACHMRN: [id number] of Birth: [birth year]

## 2020-05-21 ENCOUNTER — Other Ambulatory Visit: Payer: Self-pay

## 2020-05-21 ENCOUNTER — Ambulatory Visit: Payer: Medicaid Other | Attending: Student

## 2020-05-21 DIAGNOSIS — R278 Other lack of coordination: Secondary | ICD-10-CM | POA: Insufficient documentation

## 2020-05-21 DIAGNOSIS — M6281 Muscle weakness (generalized): Secondary | ICD-10-CM | POA: Insufficient documentation

## 2020-05-21 DIAGNOSIS — R2681 Unsteadiness on feet: Secondary | ICD-10-CM | POA: Diagnosis present

## 2020-05-21 NOTE — Therapy (Signed)
[place] [facility]IN [facility] [address] [place], [place], [address] Phone: [phone]   Fax:  [phone]  Physical Therapy Treatment/Recertification  Patient Details  Name: Michelle Randall MRN: [id number] Date of Birth: 02/09/1996 Referring Provider (PT): Wayland Denis PA   Encounter Date: 05/21/2020   PT End of Session - 05/21/20 1450    Visit Number 13    Number of Visits 19    Date for PT Re-Evaluation 08/13/20    Authorization Type 27 per year allowed OT and PT combined    Authorization Time Period 12 visits 01/09/20-06/24/20    Authorization - Visit Number 10    Authorization - Number of Visits 12    PT Start Time 4431    PT Stop Time 5400    PT Time Calculation (min) 45 min    Equipment Utilized During Treatment Gait belt;Other (comment)    Activity Tolerance Patient tolerated treatment well;No increased pain    Behavior During Therapy WFL for tasks assessed/performed           Past Medical History:  Diagnosis Date  . Neurogenic bladder   . Neurogenic bowel   . S/P VP shunt   . Spina bifida [facility])     Past Surgical History:  Procedure Laterality Date  . VENTRICULOPERITONEAL SHUNT      There were no vitals filed for this visit.   Subjective Assessment - 05/21/20 1448    Subjective Pt doing well, no major updates. No falls since last visit. No specific questions concerns at this time. Classes are going well.    Pertinent History Patient is a pleasant 24 year old female who presents for continuation of care for generalized weakness/ coordination deficits secondary to diagnosis of spina bifida. PMH includes VP shunt, spina bifida, and nuerogenic bowel/bladder. Patient was discharged from this clinic on 07/18/19 due to meeting therapy cap for insurance that year. Patient's request for a power chair was denied, is appealing the process. Has been doing the sitting exercises and laying down ones. Is biking when not cold  outside. She reports having multiple near falls when negotiating stairs at home. Patient must ascend/descend stairs to reach bedroom upstairs increasing her risk for falls daily.    Limitations Walking;Standing;House hold activities;Other (comment)    How long can you sit comfortably? n/a    How long can you stand comfortably? require use of bilateral lofstrand crutches    How long can you walk comfortably? dependent upon terrain    Patient Stated Goals Patient wants to be able to negotiate uneven terrain at school, cross thresholds in home environment, and negotiate steps in household     Currently in Pain? No/denies              TREATMENT   Ther-ex Removed AFO's for exercises in // bars: Standing marching with BUE support in // bars x20bilateral; Mini squats in // bars without UE supportx 20; 6"forwardstep ups with BUE support alternating leading LE x 10 each; Sit to stand from regular height chair without UE support x 10; Sit to stand from regular height chair without UE support and Airex pad under feet x 10, more difficult for patient; Standing hip abduction with BUE support x 20 BLE; Split squats with BUE support on // bars alternating forward LEx 20each; Seated LAQ with 1# ankle weight x 10 bilateral; Seated HS curls with red tband x 10 bilateral; Seated adductor squeeze with manual resistance from therapist x 10 bilateral; Seated  abduction with manual resistance from therapist x 10 bilateral    Neuromuscular Re-education Airex WBOS static balance without UE support x 60s; Airex NBOS static balance without UE support eyes open/closed x 60s each; Airex alternating stepping stone taps without UE support Forward/backward gait in // bars with BUE support 4 lengths eachdirection; Sidestepping in // bars with BUE support x 2 lengths each direction; Standing ball kicks in // bars with BUE support x multiple bouts on each leg;   Pt educated throughout session  about proper posture and technique with exercises. Improved exercise technique, movement at target joints, use of target muscles after min to mod verbal, visual, tactile cues.   Pt demonstrates excellent motivation during session today. Balance progressed to harder exercises utilizing bilateral and unilateral support for standing. She is able to complete all exercises as instructed today. Session focused on exercises in the // bars todaywith a mixture of strength and balance exercises. AFOs removed during session to challenge ankle stability.She requires verbal and tactile cues as well as assist for exercises today and would be unable to complete all of her exercises without the direction of a physical therapist. Updated outcome measures and goals during previous visit. Her TUG, BERG, 59mgait speed, and FOTO remained relatively consistent. Her 5TSTS improved from 10.0s to 7.4s. Pt shows nice maintenance of her functional outcome measures compared to the drop she experienced from a prolonged absence out of therapy during the end of last year. She requires physical therapy intervention to maintain her functional status including LE strength, balance, and gait speed/quality. She will benefit from continued PT services to address these deficits and [facility].                                  PT Short Term Goals - 05/21/20 1623      PT SHORT TERM GOAL #1   Title Patient will report compliance with HEP for continued strengthening and stability during functional mobility.     Time 6    Period Weeks    Status On-going    Target Date 07/02/20             PT Long Term Goals - 05/21/20 1623      PT LONG TERM GOAL #1   Title Patient will increase their FOTO score by >10 points to increase to 53% to demonstrate increased function and mobility for higher quality of life.    Baseline 11/20/19 42; 02/14/20: 53; 04/09/20: 53    Time 12     Period Weeks    Status Achieved      PT LONG TERM GOAL #2   Title Patient will increase Berg Balance score by > 6 points (32/56) to demonstrate decreased fall risk during functional activities.    Baseline 1/6: 26/56; 02/14/20: 30/56; 04/09/20: 30/56    Time 12    Period Weeks    Status Partially Met    Target Date 08/13/20      PT LONG TERM GOAL #3   Title Patient will be require no assist with ascend/descend flight of stairs using Least restrictive assistive device.    Baseline 1/6: step to pattern of ascend/descend with near falls; 02/14/20: step-to pattern with CGA; 04/09/20: step-to pattern with CGA    Time 12    Period Weeks    Status Partially Met    Target Date 08/13/20  PT LONG TERM GOAL #4   Title Patient will complete a TUG test in < 12 seconds for independent mobility and decreased fall risk     Baseline 1/6: 16 seconds; 02/14/20: 14.9s; 04/09/20: 14.3s    Time 12    Period Weeks    Status Partially Met    Target Date 08/13/20      PT LONG TERM GOAL #5   Title Patient will increase fastest 10 meter walk test to >1.35ms as to improve gait speed for better community ambulation and to reduce fall risk.    Baseline 1/6: 0.55 m/s; 02/14/20: 0.63 m/s; 04/09/20: 0.67 m/s    Time 12    Period Weeks    Status Partially Met    Target Date 08/13/20                 Plan - 05/21/20 1454    Clinical Impression Statement Pt demonstrates excellent motivation during session today. Balance progressed to harder exercises utilizing bilateral and unilateral support for standing. She is able to complete all exercises as instructed today. Session focused on exercises in the // bars today with a mixture of strength and balance exercises. AFOs removed during session to challenge ankle stability. She requires verbal and tactile cues as well as assist for exercises today and would be unable to complete all of her exercises without the direction of a physical therapist. Updated outcome measures  and goals during previous visit. Her TUG, BERG, 143mait speed, and FOTO remained relatively consistent. Her 5TSTS improved from 10.0s to 7.4s. Pt shows nice maintenance of her functional outcome measures compared to the drop she experienced from a prolonged absence out of therapy during the end of last year. She requires physical therapy intervention to maintain her functional status including LE strength, balance, and gait speed/quality. She will benefit from continued PT services to address these deficits and maintain her function at home and school.    Personal Factors and Comorbidities Comorbidity 1;Past/Current Experience;Social Background;Time since onset of injury/illness/exacerbation;Fitness;Transportation    Comorbidities spina bifida    Examination-Activity Limitations Bend;Carry;Continence;Lift;Reach Overhead;Squat;Stairs    Examination-Participation Restrictions Church;Community Activity;Driving;Laundry;Meal Prep;Volunteer;Yard Work    Stability/Clinical Decision Making Stable/Uncomplicated    Rehab Potential Good    Clinical Impairments Affecting Rehab Potential weakness and decreased standing balance    PT Frequency Biweekly    PT Duration 12 weeks    PT Treatment/Interventions ADLs/Self Care Home Management;Aquatic Therapy;Biofeedback;Ultrasound;Moist Heat;Iontophoresis 57m15ml Dexamethasone;Electrical Stimulation;DME Instruction;Gait training;Stair training;Functional mobility training;Neuromuscular re-education;Balance training;Therapeutic exercise;Therapeutic activities;Patient/family education;Orthotic Fit/Training;Wheelchair mobility training;Manual techniques;Dry needling;Passive range of motion;Energy conservation;Splinting;Taping;Vestibular;Visual/perceptual remediation/compensation    PT Next Visit Plan continue balance and strengthening    PT Home Exercise Plan Seated, supine, and core program: [id number]    Consulted and Agree with Plan of Care Patient           Patient  will benefit from skilled therapeutic intervention in order to improve the following deficits and impairments:  Abnormal gait, Decreased mobility, Decreased activity tolerance, Decreased balance, Decreased endurance, Decreased coordination, Decreased range of motion, Difficulty walking, Decreased strength, Impaired flexibility, Increased muscle spasms, Impaired tone, Postural dysfunction, Improper body mechanics  Visit Diagnosis: Muscle weakness (generalized)  Unsteadiness on feet     Problem List There are no problems to display for this patient.  JasPhillips Grout, DPT, GCS  Klaryssa Fauth 05/21/2020, 4:24 PM  [facility] [address] [place]one: [phone]:  [phone]  Name: Michelle Randall MRN: [id number] Date of Birth: 05-27-1996

## 2020-06-04 ENCOUNTER — Other Ambulatory Visit: Payer: Self-pay

## 2020-06-04 ENCOUNTER — Encounter: Payer: Medicaid Other | Admitting: Occupational Therapy

## 2020-06-04 ENCOUNTER — Ambulatory Visit: Payer: Medicaid Other

## 2020-06-04 DIAGNOSIS — R278 Other lack of coordination: Secondary | ICD-10-CM

## 2020-06-04 DIAGNOSIS — M6281 Muscle weakness (generalized): Secondary | ICD-10-CM | POA: Diagnosis not present

## 2020-06-04 DIAGNOSIS — R2681 Unsteadiness on feet: Secondary | ICD-10-CM

## 2020-06-04 NOTE — Therapy (Signed)
[place] MAIN [facility] SERVICES [address] [place], [place], [address] Phone: [phone]   Fax:  [phone]  Physical Therapy Treatment  Patient Details  Name: Michelle Randall MRN: [id number] Date of Birth: Sep 07, 1996 Referring Provider (PT): Wayland Denis PA   Encounter Date: 06/04/2020   PT End of Session - 06/04/20 1708    Visit Number 14    Number of Visits 19    Date for PT Re-Evaluation 08/13/20    Authorization Type 27 per year allowed OT and PT combined    Authorization Time Period 12 visits 01/09/20-06/24/20    Authorization - Visit Number 11    Authorization - Number of Visits 12    PT Start Time 3612    PT Stop Time 1600    PT Time Calculation (min) 39 min    Equipment Utilized During Treatment Gait belt;Other (comment)    Activity Tolerance Patient tolerated treatment well;No increased pain    Behavior During Therapy WFL for tasks assessed/performed           Past Medical History:  Diagnosis Date  . Neurogenic bladder   . Neurogenic bowel   . S/P VP shunt   . Spina bifida [facility])     Past Surgical History:  Procedure Laterality Date  . VENTRICULOPERITONEAL SHUNT      There were no vitals filed for this visit.   Subjective Assessment - 06/04/20 1710    Subjective Pt is doing well and reports no major updates. No falls since last visit. No specific questions concerns at this time.    Pertinent History Patient is a pleasant 24 year old female who presents for continuation of care for generalized weakness/ coordination deficits secondary to diagnosis of spina bifida. PMH includes VP shunt, spina bifida, and nuerogenic bowel/bladder. Patient was discharged from this clinic on 07/18/19 due to meeting therapy cap for insurance that year. Patient's request for a power chair was denied, is appealing the process. Has been doing the sitting exercises and laying down ones. Is biking when not cold outside. She reports having  multiple near falls when negotiating stairs at home. Patient must ascend/descend stairs to reach bedroom upstairs increasing her risk for falls daily.    Limitations Walking;Standing;House hold activities;Other (comment)    How long can you sit comfortably? n/a    How long can you stand comfortably? require use of bilateral lofstrand crutches    How long can you walk comfortably? dependent upon terrain    Patient Stated Goals Patient wants to be able to negotiate uneven terrain at school, cross thresholds in home environment, and negotiate steps in household             TREATMENT    Ther-ex  Removed AFO's for exercises in // bars: Standing marching with BUE support in // bars x 20 bilateral; Mini squats in // bars without UE support x 20; 6" forward step ups with BUE support alternating leading LE x 10 each; Sit to stand from regular height chair without UE support x 10; Sit to stand from regular height chair without UE support and Airex pad under feet x 15, more difficult for patient;     Neuromuscular Re-education  Forward/backward gait in // bars with BUE support 4 lengths each direction; Airex NBOS static balance without UE support and balloon tosses; Airex alternating stepping stone taps without UE support; FT on airex R reaches for ball to shoot in the hoop, patient able to hold reach  and maintain balance; no UE support needed when shooting; Standing ball kicks in // bars with BUE support x multiple bouts on each leg, cued to take L UE off of bars;     Pt educated throughout session about proper posture and technique with exercises. Improved exercise technique, movement at target joints, use of target muscles after min to mod verbal, visual, tactile cues.      Pt demonstrates excellent motivation during session today. Balance progressed to harder dynamic exercises utilizing bilateral and unilateral support for standing. Patient has increased reps of sit to stand with airex  pad under feet indicating increased strength. She is able to complete all exercises as instructed today. Session focused on exercises in the // bars today with a mixture of strength and balance exercises. AFOs removed during session to challenge ankle stability. She requires verbal and tactile cues as well as assist for exercises today and would be unable to complete all of her exercises without the direction of a physical therapist. She will benefit from continued PT services to address these deficits and maintain her function at home and school.                 PT Short Term Goals - 05/21/20 1623      PT SHORT TERM GOAL #1   Title Patient will report compliance with HEP for continued strengthening and stability during functional mobility.     Time 6    Period Weeks    Status On-going    Target Date 07/02/20             PT Long Term Goals - 05/21/20 1623      PT LONG TERM GOAL #1   Title Patient will increase their FOTO score by >10 points to increase to 53% to demonstrate increased function and mobility for higher quality of life.    Baseline 11/20/19 42; 02/14/20: 53; 04/09/20: 53    Time 12    Period Weeks    Status Achieved      PT LONG TERM GOAL #2   Title Patient will increase Berg Balance score by > 6 points (32/56) to demonstrate decreased fall risk during functional activities.    Baseline 1/6: 26/56; 02/14/20: 30/56; 04/09/20: 30/56    Time 12    Period Weeks    Status Partially Met    Target Date 08/13/20      PT LONG TERM GOAL #3   Title Patient will be require no assist with ascend/descend flight of stairs using Least restrictive assistive device.    Baseline 1/6: step to pattern of ascend/descend with near falls; 02/14/20: step-to pattern with CGA; 04/09/20: step-to pattern with CGA    Time 12    Period Weeks    Status Partially Met    Target Date 08/13/20      PT LONG TERM GOAL #4   Title Patient will complete a TUG test in < 12 seconds for independent  mobility and decreased fall risk     Baseline 1/6: 16 seconds; 02/14/20: 14.9s; 04/09/20: 14.3s    Time 12    Period Weeks    Status Partially Met    Target Date 08/13/20      PT LONG TERM GOAL #5   Title Patient will increase fastest 10 meter walk test to >1.44ms as to improve gait speed for better community ambulation and to reduce fall risk.    Baseline 1/6: 0.55 m/s; 02/14/20: 0.63 m/s; 04/09/20: 0.67 m/s  Time 12    Period Weeks    Status Partially Met    Target Date 08/13/20                 Plan - 06/04/20 1708    Clinical Impression Statement Pt demonstrates excellent motivation during session today. Balance progressed to harder dynamic exercises utilizing bilateral and unilateral support for standing. Patient has increased reps of sit to stand with airex pad under feet indicating increased strength. She is able to complete all exercises as instructed today. Session focused on exercises in the // bars today with a mixture of strength and balance exercises. AFOs removed during session to challenge ankle stability. She requires verbal and tactile cues as well as assist for exercises today and would be unable to complete all of her exercises without the direction of a physical therapist. She will benefit from continued PT services to address these deficits and maintain her function at home and school.    Personal Factors and Comorbidities Comorbidity 1;Past/Current Experience;Social Background;Time since onset of injury/illness/exacerbation;Fitness;Transportation    Comorbidities spina bifida    Examination-Activity Limitations Bend;Carry;Continence;Lift;Reach Overhead;Squat;Stairs    Examination-Participation Restrictions Church;Community Activity;Driving;Laundry;Meal Prep;Volunteer;Yard Work    Stability/Clinical Decision Making Stable/Uncomplicated    Rehab Potential Good    Clinical Impairments Affecting Rehab Potential weakness and decreased standing balance    PT Frequency  Biweekly    PT Duration 12 weeks    PT Treatment/Interventions ADLs/Self Care Home Management;Aquatic Therapy;Biofeedback;Ultrasound;Moist Heat;Iontophoresis 21m/ml Dexamethasone;Electrical Stimulation;DME Instruction;Gait training;Stair training;Functional mobility training;Neuromuscular re-education;Balance training;Therapeutic exercise;Therapeutic activities;Patient/family education;Orthotic Fit/Training;Wheelchair mobility training;Manual techniques;Dry needling;Passive range of motion;Energy conservation;Splinting;Taping;Vestibular;Visual/perceptual remediation/compensation    PT Next Visit Plan continue balance and strengthening    PT Home Exercise Plan Seated, supine, and core program: [id number]    Consulted and Agree with Plan of Care Patient           Patient will benefit from skilled therapeutic intervention in order to improve the following deficits and impairments:  Abnormal gait, Decreased mobility, Decreased activity tolerance, Decreased balance, Decreased endurance, Decreased coordination, Decreased range of motion, Difficulty walking, Decreased strength, Impaired flexibility, Increased muscle spasms, Impaired tone, Postural dysfunction, Improper body mechanics  Visit Diagnosis: Muscle weakness (generalized)  Unsteadiness on feet  Other lack of coordination     Problem List There are no problems to display for this patient.  This entire session was performed under direct supervision and direction of a licensed therapist/therapist assistant . I have personally read, edited and approve of the note as written.   KNoemi Chapel SPT JPhillips GroutPT, DPT, GCS  Huprich,Jason 06/05/2020, 11:52 AM  CC[facility]MAIN RUnited [facility] CenterSERVICES [address] [address]Phone: [phone]  Fax:  [phone] Name: Michelle WICKEYMRN: [id number] of Birth: 9April 27, 1997

## 2020-06-17 ENCOUNTER — Ambulatory Visit: Payer: Medicaid Other | Attending: Student

## 2020-06-17 ENCOUNTER — Other Ambulatory Visit: Payer: Self-pay

## 2020-06-17 DIAGNOSIS — R2681 Unsteadiness on feet: Secondary | ICD-10-CM | POA: Insufficient documentation

## 2020-06-17 DIAGNOSIS — M6281 Muscle weakness (generalized): Secondary | ICD-10-CM | POA: Insufficient documentation

## 2020-06-17 DIAGNOSIS — R262 Difficulty in walking, not elsewhere classified: Secondary | ICD-10-CM | POA: Insufficient documentation

## 2020-06-17 DIAGNOSIS — R278 Other lack of coordination: Secondary | ICD-10-CM | POA: Insufficient documentation

## 2020-06-17 NOTE — Therapy (Signed)
[place] MAIN [facility] SERVICES [address] [place], [place], [address] Phone: [phone]   Fax:  [phone]  Physical Therapy Treatment  Patient Details  Name: Michelle Randall MRN: [id number] Date of Birth: [birth year] Referring Provider (PT): Wayland Denis PA   Encounter Date: 06/17/2020   PT End of Session - 06/17/20 1651    Visit Number 15    Number of Visits 19    Date for PT Re-Evaluation 08/13/20    Authorization Type 27 per year allowed OT and PT combined    Authorization Time Period 12 visits 01/09/20-06/24/20    Authorization - Visit Number 11    Authorization - Number of Visits 12    PT Start Time 9169    PT Stop Time 4503    PT Time Calculation (min) 49 min    Equipment Utilized During Treatment Gait belt;Other (comment)    Activity Tolerance Patient tolerated treatment well;No increased pain    Behavior During Therapy WFL for tasks assessed/performed           Past Medical History:  Diagnosis Date   Neurogenic bladder    Neurogenic bowel    S/P VP shunt    Spina bifida [facility])     Past Surgical History:  Procedure Laterality Date   VENTRICULOPERITONEAL SHUNT      There were no vitals filed for this visit.   Subjective Assessment - 06/17/20 1645    Subjective Pt is doing well and reports no major updates. No falls since last visit, she notes she has been catching her foot on the last step and feels unsafe to do stairs without someone behind her at home. No specific questions concerns at this time.    Pertinent History Patient is a pleasant 24 year old female who presents for continuation of care for generalized weakness/ coordination deficits secondary to diagnosis of spina bifida. PMH includes VP shunt, spina bifida, and nuerogenic bowel/bladder. Patient was discharged from this clinic on 07/18/19 due to meeting therapy cap for insurance that year. Patient's request for a power chair was denied, is appealing the  process. Has been doing the sitting exercises and laying down ones. Is biking when not cold outside. She reports having multiple near falls when negotiating stairs at home. Patient must ascend/descend stairs to reach bedroom upstairs increasing her risk for falls daily.    Limitations Walking;Standing;House hold activities;Other (comment)    How long can you sit comfortably? n/a    How long can you stand comfortably? require use of bilateral lofstrand crutches    How long can you walk comfortably? dependent upon terrain    Patient Stated Goals Patient wants to be able to negotiate uneven terrain at school, cross thresholds in home environment, and negotiate steps in household     Currently in Pain? No/denies            TREATMENT   Ther-ex  Removed AFO's for exercises in // bars: Forward/backward gait in // bars with BUE support 2 lengths each direction; Sidestepping in // bars with BUE support x 2 lengths each direction;  Standing hip exercises with 1.5# AW with BUE support Marching x 10 each; Hip extensions x 15 each Hip abductions x 15 each;  Seated LAQ with 1.5 AW x 10 bilateral  Stair training for home set-up, AFO's stayed on.   6" forward step ups with BUE support alternating leading LE x 10 each, AFO's on   Pt educated throughout session about proper  posture and technique with exercises. Improved exercise technique, movement at target joints, use of target muscles after min to mod verbal, visual, tactile cues.      Pt demonstrates excellent motivation during session today. Session focused on strengthening exercises in the parallel bars. Added ankle weights to exercises for added resistance. Patient notes difficulty climbing up the last stair at home due to railing ending at the top. This is a new problem that has come up in the last few months due to muscle weakness. She notes she doesn't feel as strong as she did before because of less PT visits despite doing HEP. Patient  encouraged to follow up as scheduled and continue with HEP with emphasis on the hip flexion exercises. She will benefit from continued PT services to address these deficits and maintain her function at home and school.        PT Short Term Goals - 05/21/20 1623      PT SHORT TERM GOAL #1   Title Patient will report compliance with HEP for continued strengthening and stability during functional mobility.     Time 6    Period Weeks    Status On-going    Target Date 07/02/20             PT Long Term Goals - 05/21/20 1623      PT LONG TERM GOAL #1   Title Patient will increase their FOTO score by >10 points to increase to 53% to demonstrate increased function and mobility for higher quality of life.    Baseline 11/20/19 42; 02/14/20: 53; 04/09/20: 53    Time 12    Period Weeks    Status Achieved      PT LONG TERM GOAL #2   Title Patient will increase Berg Balance score by > 6 points (32/56) to demonstrate decreased fall risk during functional activities.    Baseline 1/6: 26/56; 02/14/20: 30/56; 04/09/20: 30/56    Time 12    Period Weeks    Status Partially Met    Target Date 08/13/20      PT LONG TERM GOAL #3   Title Patient will be require no assist with ascend/descend flight of stairs using Least restrictive assistive device.    Baseline 1/6: step to pattern of ascend/descend with near falls; 02/14/20: step-to pattern with CGA; 04/09/20: step-to pattern with CGA    Time 12    Period Weeks    Status Partially Met    Target Date 08/13/20      PT LONG TERM GOAL #4   Title Patient will complete a TUG test in < 12 seconds for independent mobility and decreased fall risk     Baseline 1/6: 16 seconds; 02/14/20: 14.9s; 04/09/20: 14.3s    Time 12    Period Weeks    Status Partially Met    Target Date 08/13/20      PT LONG TERM GOAL #5   Title Patient will increase fastest 10 meter walk test to >1.71ms as to improve gait speed for better community ambulation and to reduce fall risk.     Baseline 1/6: 0.55 m/s; 02/14/20: 0.63 m/s; 04/09/20: 0.67 m/s    Time 12    Period Weeks    Status Partially Met    Target Date 08/13/20                 Plan - 06/17/20 1651    Clinical Impression Statement Pt demonstrates excellent motivation during session today. Session focused on  strengthening exercises in the parallel bars. Added ankle weights to exercises for added resistance. Patient notes difficulty climbing up the last stair at home due to railing ending at the top. This is a new problem that has come up in the last few months due to muscle weakness. She notes she doesn't feel as strong as she did before because of less PT visits despite doing HEP. Patient encouraged to follow up as scheduled and continue with HEP with emphasis on the hip flexion exercises. She will benefit from continued PT services to address these deficits and maintain her function at home and school.    Personal Factors and Comorbidities Comorbidity 1;Past/Current Experience;Social Background;Time since onset of injury/illness/exacerbation;Fitness;Transportation    Comorbidities spina bifida    Examination-Activity Limitations Bend;Carry;Continence;Lift;Reach Overhead;Squat;Stairs    Examination-Participation Restrictions Church;Community Activity;Driving;Laundry;Meal Prep;Volunteer;Yard Work    Stability/Clinical Decision Making Stable/Uncomplicated    Rehab Potential Good    Clinical Impairments Affecting Rehab Potential weakness and decreased standing balance    PT Frequency Biweekly    PT Duration 12 weeks    PT Treatment/Interventions ADLs/Self Care Home Management;Aquatic Therapy;Biofeedback;Ultrasound;Moist Heat;Iontophoresis 90m/ml Dexamethasone;Electrical Stimulation;DME Instruction;Gait training;Stair training;Functional mobility training;Neuromuscular re-education;Balance training;Therapeutic exercise;Therapeutic activities;Patient/family education;Orthotic Fit/Training;Wheelchair mobility  training;Manual techniques;Dry needling;Passive range of motion;Energy conservation;Splinting;Taping;Vestibular;Visual/perceptual remediation/compensation    PT Next Visit Plan continue balance and strengthening    PT Home Exercise Plan Seated, supine, and core program: [id number]    Consulted and Agree with Plan of Care Patient           Patient will benefit from skilled therapeutic intervention in order to improve the following deficits and impairments:  Abnormal gait, Decreased mobility, Decreased activity tolerance, Decreased balance, Decreased endurance, Decreased coordination, Decreased range of motion, Difficulty walking, Decreased strength, Impaired flexibility, Increased muscle spasms, Impaired tone, Postural dysfunction, Improper body mechanics  Visit Diagnosis: Unsteadiness on feet  Muscle weakness (generalized)  Other lack of coordination  Difficulty in walking, not elsewhere classified     Problem List There are no problems to display for this patient.  This entire session was performed under direct supervision and direction of a licensed therapist/therapist assistant . I have personally read, edited and approve of the note as written.   KNoemi Chapel SPT JPhillips GroutPT, DPT, GCS  Huprich,Jason 06/18/2020, 11:25 AM  [place] [address] [address]Phone: [phone]  Fax:  [phone] Name: Michelle ANGELLMRN: [id number] of Birth: [birth year]

## 2020-06-18 ENCOUNTER — Ambulatory Visit: Payer: Medicaid Other

## 2020-06-19 ENCOUNTER — Ambulatory Visit: Payer: Medicaid Other

## 2020-07-01 ENCOUNTER — Other Ambulatory Visit: Payer: Self-pay

## 2020-07-01 ENCOUNTER — Ambulatory Visit: Payer: Medicaid Other

## 2020-07-01 DIAGNOSIS — R2681 Unsteadiness on feet: Secondary | ICD-10-CM | POA: Diagnosis not present

## 2020-07-01 DIAGNOSIS — M6281 Muscle weakness (generalized): Secondary | ICD-10-CM

## 2020-07-01 NOTE — Therapy (Signed)
[place] MAIN [facility] SERVICES [address] [place], [place], [address] Phone: [phone]   Fax:  [phone]  Physical Therapy Treatment  Patient Details  Name: Michelle Randall MRN: [id number] Date of Birth: 1995-12-23 Referring Provider (PT): Wayland Denis PA   Encounter Date: 07/01/2020   PT End of Session - 07/01/20 1700    Visit Number 16    Number of Visits 19    Date for PT Re-Evaluation 08/13/20    Authorization Type 27 per year allowed OT and PT combined    Authorization Time Period 8 visits 07/01/20-10/20/20    Authorization - Visit Number 1    Authorization - Number of Visits 8    PT Start Time 5400    PT Stop Time 1730    PT Time Calculation (min) 43 min    Equipment Utilized During Treatment Gait belt;Other (comment)   Bilateral AFOs   Activity Tolerance Patient tolerated treatment well;No increased pain    Behavior During Therapy WFL for tasks assessed/performed           Past Medical History:  Diagnosis Date   Neurogenic bladder    Neurogenic bowel    S/P VP shunt    Spina bifida [facility])     Past Surgical History:  Procedure Laterality Date   VENTRICULOPERITONEAL SHUNT      There were no vitals filed for this visit.   Subjective Assessment - 07/01/20 1652    Subjective Pt is doing well and reports no major updates. No falls since last visit, she notes she has been catching her foot on the last step due to it being 8 inches and the rest 6 inches. Her HEP has been going well. No specific questions concerns at this time.    Pertinent History Patient is a pleasant 24 year old female who presents for continuation of care for generalized weakness/ coordination deficits secondary to diagnosis of spina bifida. PMH includes VP shunt, spina bifida, and nuerogenic bowel/bladder. Patient was discharged from this clinic on 07/18/19 due to meeting therapy cap for insurance that year. Patient's request for a power chair was  denied, is appealing the process. Has been doing the sitting exercises and laying down ones. Is biking when not cold outside. She reports having multiple near falls when negotiating stairs at home. Patient must ascend/descend stairs to reach bedroom upstairs increasing her risk for falls daily.    Limitations Walking;Standing;House hold activities;Other (comment)    How long can you sit comfortably? n/a    How long can you stand comfortably? require use of bilateral lofstrand crutches    How long can you walk comfortably? dependent upon terrain    Patient Stated Goals Patient wants to be able to negotiate uneven terrain at school, cross thresholds in home environment, and negotiate steps in household     Currently in Pain? No/denies            TREATMENT      Ther-ex   Removed AFO's for exercises in // bars:  Standing exercises with 1.5# AW with BUE support:  Hip marches x 20 each;  Hip extensions x 20 each  Hip abductions x 20 each;  Heel toe raises x 20 each, patient reports muscles working but not able to see much ROM;   Seated LAQ with 1.5 AW x 10 bilateral, cued to hold for 3 seconds  8" forward step ups with BUE support x 10 each LE, without AFOs, put AFOs back on  when her shoe came off as it was too loose without her AFOs on.    Pt educated throughout session about proper posture and technique with exercises. Improved exercise technique, movement at target joints, use of target muscles after min to mod verbal, visual, tactile cues.     Pt demonstrates excellent motivation during session today. Session focused on strengthening exercises in the parallel bars without AFOs on until her shoe came off due to shoe being too loose. Added ankle weights to exercises for added resistance. Patient notes difficulty climbing up the last stair at home due to it being 8" and the rest 6" so worked on doing step ups with 8" step. Patient encouraged to follow up as scheduled and continue with HEP.  She will benefit from continued PT services to address these deficits and maintain her function at home and school.           PT Short Term Goals - 05/21/20 1623      PT SHORT TERM GOAL #1   Title Patient will report compliance with HEP for continued strengthening and stability during functional mobility.     Time 6    Period Weeks    Status On-going    Target Date 07/02/20             PT Long Term Goals - 05/21/20 1623      PT LONG TERM GOAL #1   Title Patient will increase their FOTO score by >10 points to increase to 53% to demonstrate increased function and mobility for higher quality of life.    Baseline 11/20/19 42; 02/14/20: 53; 04/09/20: 53    Time 12    Period Weeks    Status Achieved      PT LONG TERM GOAL #2   Title Patient will increase Berg Balance score by > 6 points (32/56) to demonstrate decreased fall risk during functional activities.    Baseline 1/6: 26/56; 02/14/20: 30/56; 04/09/20: 30/56    Time 12    Period Weeks    Status Partially Met    Target Date 08/13/20      PT LONG TERM GOAL #3   Title Patient will be require no assist with ascend/descend flight of stairs using Least restrictive assistive device.    Baseline 1/6: step to pattern of ascend/descend with near falls; 02/14/20: step-to pattern with CGA; 04/09/20: step-to pattern with CGA    Time 12    Period Weeks    Status Partially Met    Target Date 08/13/20      PT LONG TERM GOAL #4   Title Patient will complete a TUG test in < 12 seconds for independent mobility and decreased fall risk     Baseline 1/6: 16 seconds; 02/14/20: 14.9s; 04/09/20: 14.3s    Time 12    Period Weeks    Status Partially Met    Target Date 08/13/20      PT LONG TERM GOAL #5   Title Patient will increase fastest 10 meter walk test to >1.48ms as to improve gait speed for better community ambulation and to reduce fall risk.    Baseline 1/6: 0.55 m/s; 02/14/20: 0.63 m/s; 04/09/20: 0.67 m/s    Time 12    Period Weeks    Status  Partially Met    Target Date 08/13/20                 Plan - 07/01/20 1700    Clinical Impression Statement Pt demonstrates excellent motivation  during session today. Session focused on strengthening exercises in the parallel bars without AFOs on until her shoe came off due to shoe being too loose. Added ankle weights to exercises for added resistance. Patient notes difficulty climbing up the last stair at home due to it being 8" and the rest 6" so worked on doing step ups with 8" step. Patient encouraged to follow up as scheduled and continue with HEP. She will benefit from continued PT services to address these deficits and maintain her function at home and school.    Personal Factors and Comorbidities Comorbidity 1;Past/Current Experience;Social Background;Time since onset of injury/illness/exacerbation;Fitness;Transportation    Comorbidities spina bifida    Examination-Activity Limitations Bend;Carry;Continence;Lift;Reach Overhead;Squat;Stairs    Examination-Participation Restrictions Church;Community Activity;Driving;Laundry;Meal Prep;Volunteer;Yard Work    Stability/Clinical Decision Making Stable/Uncomplicated    Rehab Potential Good    Clinical Impairments Affecting Rehab Potential weakness and decreased standing balance    PT Frequency Biweekly    PT Duration 12 weeks    PT Treatment/Interventions ADLs/Self Care Home Management;Aquatic Therapy;Biofeedback;Ultrasound;Moist Heat;Iontophoresis 63m/ml Dexamethasone;Electrical Stimulation;DME Instruction;Gait training;Stair training;Functional mobility training;Neuromuscular re-education;Balance training;Therapeutic exercise;Therapeutic activities;Patient/family education;Orthotic Fit/Training;Wheelchair mobility training;Manual techniques;Dry needling;Passive range of motion;Energy conservation;Splinting;Taping;Vestibular;Visual/perceptual remediation/compensation    PT Next Visit Plan continue balance and strengthening    PT Home  Exercise Plan Seated, supine, and core program: [id number]    Consulted and Agree with Plan of Care Patient           Patient will benefit from skilled therapeutic intervention in order to improve the following deficits and impairments:  Abnormal gait, Decreased mobility, Decreased activity tolerance, Decreased balance, Decreased endurance, Decreased coordination, Decreased range of motion, Difficulty walking, Decreased strength, Impaired flexibility, Increased muscle spasms, Impaired tone, Postural dysfunction, Improper body mechanics  Visit Diagnosis: Unsteadiness on feet  Muscle weakness (generalized)     Problem List There are no problems to display for this patient.  This entire session was performed under direct supervision and direction of a licensed therapist/therapist assistant . I have personally read, edited and approve of the note as written.   KNoemi Chapel SPT JPhillips GroutPT, DPT, GCS  Huprich,Jason 07/02/2020, 1:24 PM  [place]MAIN R[facility] HospitalSERV[facility] [address] [address]Phone: [phone]  Fax:  [phone] Name: Michelle GONIAMRN: [id number] of Birth: [birth year]

## 2020-07-17 ENCOUNTER — Ambulatory Visit: Payer: Medicaid Other | Attending: Student

## 2020-07-17 ENCOUNTER — Other Ambulatory Visit: Payer: Self-pay

## 2020-07-17 DIAGNOSIS — R2681 Unsteadiness on feet: Secondary | ICD-10-CM | POA: Diagnosis not present

## 2020-07-17 DIAGNOSIS — M6281 Muscle weakness (generalized): Secondary | ICD-10-CM | POA: Diagnosis present

## 2020-07-17 DIAGNOSIS — R2689 Other abnormalities of gait and mobility: Secondary | ICD-10-CM | POA: Insufficient documentation

## 2020-07-17 NOTE — Therapy (Signed)
[place] MAIN [facility] SERVICES [address] [place], [place], [address] Phone: [phone]   Fax:  [phone]  Physical Therapy Treatment  Patient Details  Name: Michelle Randall MRN: [id number] Date of Birth: June 05, 1996 Referring Provider (PT): Wayland Denis PA   Encounter Date: 07/17/2020   PT End of Session - 07/17/20 1659    Visit Number 17    Number of Visits 19    Date for PT Re-Evaluation 08/13/20    Authorization Type 27 per year allowed OT and PT combined    Authorization Time Period 8 visits 07/01/20-10/20/20    Authorization - Visit Number 2    Authorization - Number of Visits 8    PT Start Time 7867    PT Stop Time 1730    PT Time Calculation (min) 43 min    Equipment Utilized During Treatment Gait belt;Other (comment)   Bilateral AFOs   Activity Tolerance Patient tolerated treatment well;No increased pain    Behavior During Therapy WFL for tasks assessed/performed           Past Medical History:  Diagnosis Date   Neurogenic bladder    Neurogenic bowel    S/P VP shunt    Spina bifida [facility])     Past Surgical History:  Procedure Laterality Date   VENTRICULOPERITONEAL SHUNT      There were no vitals filed for this visit.   Subjective Assessment - 07/17/20 1651    Subjective Pt is doing well and reports no major updates. No falls since last visit, she notes she is still catching her foot on the last step. Her HEP has been going well. No specific questions concerns at this time.    Pertinent History Patient is a pleasant 24 year old female who presents for continuation of care for generalized weakness/ coordination deficits secondary to diagnosis of spina bifida. PMH includes VP shunt, spina bifida, and nuerogenic bowel/bladder. Patient was discharged from this clinic on 07/18/19 due to meeting therapy cap for insurance that year. Patient's request for a power chair was denied, is appealing the process. Has been doing  the sitting exercises and laying down ones. Is biking when not cold outside. She reports having multiple near falls when negotiating stairs at home. Patient must ascend/descend stairs to reach bedroom upstairs increasing her risk for falls daily.    Limitations Walking;Standing;House hold activities;Other (comment)    How long can you sit comfortably? n/a    How long can you stand comfortably? require use of bilateral lofstrand crutches    How long can you walk comfortably? dependent upon terrain    Patient Stated Goals Patient wants to be able to negotiate uneven terrain at school, cross thresholds in home environment, and negotiate steps in household     Currently in Pain? No/denies                TREATMENT      Ther-ex   Removed AFO's for exercises in // bars:  Forward/backward ambulation with BUE support x 2 lengths each; Side stepping x 2 lengths each direction with BUE support; Standing mini squats with BUE support x 10; 8" forward step ups with BUE support x 10 each LE; Lunges with BUE support x 10 with each foot forward; Soccer ball kicks with therapist with BUE alternating LE x multiple bouts each; Soccer ball circles with each foot CW/CCW x 10 each direction on both sides with BUE support;   Neuromuscular Re-education  NBOS static  balance without UE support 2 x 60s; Modified tandem stance balance without UE support alternating forward LE 2 x 60s each;   Pt educated throughout session about proper posture and technique with exercises. Improved exercise technique, movement at target joints, use of target muscles after min to mod verbal, visual, tactile cues.     Pt demonstrates excellent motivation during session today. Session focused on strengthening exercises in the parallel bars without AFOs as well as a couple balance exercises. Continue to practice the 8" step as this is what pt is struggling with at home. Noticed improved force with soccer ball kicking today.  Continued to provide intermittent seated rest breaks throughout session. Patient encouraged to follow up as scheduled and continue with HEP. She will benefit from continued PT services to address these deficits and maintain her function at home and school.                           PT Short Term Goals - 05/21/20 1623      PT SHORT TERM GOAL #1   Title Patient will report compliance with HEP for continued strengthening and stability during functional mobility.     Time 6    Period Weeks    Status On-going    Target Date 07/02/20             PT Long Term Goals - 05/21/20 1623      PT LONG TERM GOAL #1   Title Patient will increase their FOTO score by >10 points to increase to 53% to demonstrate increased function and mobility for higher quality of life.    Baseline 11/20/19 42; 02/14/20: 53; 04/09/20: 53    Time 12    Period Weeks    Status Achieved      PT LONG TERM GOAL #2   Title Patient will increase Berg Balance score by > 6 points (32/56) to demonstrate decreased fall risk during functional activities.    Baseline 1/6: 26/56; 02/14/20: 30/56; 04/09/20: 30/56    Time 12    Period Weeks    Status Partially Met    Target Date 08/13/20      PT LONG TERM GOAL #3   Title Patient will be require no assist with ascend/descend flight of stairs using Least restrictive assistive device.    Baseline 1/6: step to pattern of ascend/descend with near falls; 02/14/20: step-to pattern with CGA; 04/09/20: step-to pattern with CGA    Time 12    Period Weeks    Status Partially Met    Target Date 08/13/20      PT LONG TERM GOAL #4   Title Patient will complete a TUG test in < 12 seconds for independent mobility and decreased fall risk     Baseline 1/6: 16 seconds; 02/14/20: 14.9s; 04/09/20: 14.3s    Time 12    Period Weeks    Status Partially Met    Target Date 08/13/20      PT LONG TERM GOAL #5   Title Patient will increase fastest 10 meter walk test to >1.22ms as to improve  gait speed for better community ambulation and to reduce fall risk.    Baseline 1/6: 0.55 m/s; 02/14/20: 0.63 m/s; 04/09/20: 0.67 m/s    Time 12    Period Weeks    Status Partially Met    Target Date 08/13/20  Plan - 07/17/20 1700    Clinical Impression Statement Pt demonstrates excellent motivation during session today. Session focused on strengthening exercises in the parallel bars without AFOs as well as a couple balance exercises. Continue to practice the 8" step as this is what pt is struggling with at home. Noticed improved force with soccer ball kicking today. Continued to provide intermittent seated rest breaks throughout session. Patient encouraged to follow up as scheduled and continue with HEP. She will benefit from continued PT services to address these deficits and maintain her function at home and school.    Personal Factors and Comorbidities Comorbidity 1;Past/Current Experience;Social Background;Time since onset of injury/illness/exacerbation;Fitness;Transportation    Comorbidities spina bifida    Examination-Activity Limitations Bend;Carry;Continence;Lift;Reach Overhead;Squat;Stairs    Examination-Participation Restrictions Church;Community Activity;Driving;Laundry;Meal Prep;Volunteer;Yard Work    Stability/Clinical Decision Making Stable/Uncomplicated    Rehab Potential Good    Clinical Impairments Affecting Rehab Potential weakness and decreased standing balance    PT Frequency Biweekly    PT Duration 12 weeks    PT Treatment/Interventions ADLs/Self Care Home Management;Aquatic Therapy;Biofeedback;Ultrasound;Moist Heat;Iontophoresis 3m/ml Dexamethasone;Electrical Stimulation;DME Instruction;Gait training;Stair training;Functional mobility training;Neuromuscular re-education;Balance training;Therapeutic exercise;Therapeutic activities;Patient/family education;Orthotic Fit/Training;Wheelchair mobility training;Manual techniques;Dry needling;Passive range of  motion;Energy conservation;Splinting;Taping;Vestibular;Visual/perceptual remediation/compensation    PT Next Visit Plan continue balance and strengthening    PT Home Exercise Plan Seated, supine, and core program: [id number]    Consulted and Agree with Plan of Care Patient           Patient will benefit from skilled therapeutic intervention in order to improve the following deficits and impairments:  Abnormal gait, Decreased mobility, Decreased activity tolerance, Decreased balance, Decreased endurance, Decreased coordination, Decreased range of motion, Difficulty walking, Decreased strength, Impaired flexibility, Increased muscle spasms, Impaired tone, Postural dysfunction, Improper body mechanics  Visit Diagnosis: Unsteadiness on feet  Muscle weakness (generalized)     Problem List There are no problems to display for this patient.  JPhillips GroutPT, DPT, GCS  Kenlie Seki 07/18/2020, 11:31 AM  [place] [facility] [address] [address]Phone: [phone]  Fax:  [phone] Name: MQUITA MCGRORYMRN: [id number] of Birth: 908-Feb-1997

## 2020-07-29 ENCOUNTER — Other Ambulatory Visit: Payer: Self-pay

## 2020-07-29 ENCOUNTER — Ambulatory Visit: Payer: Medicaid Other

## 2020-07-29 DIAGNOSIS — M6281 Muscle weakness (generalized): Secondary | ICD-10-CM

## 2020-07-29 DIAGNOSIS — R2681 Unsteadiness on feet: Secondary | ICD-10-CM | POA: Diagnosis not present

## 2020-07-29 NOTE — Therapy (Signed)
[name] [facility] SERVICES [address] [place], [place], [address] Phone: [phone]   Fax:  [phone]  Physical Therapy Treatment  Patient Details  Name: Michelle Randall MRN: [id number] Date of Birth: Feb 19, 1996 Referring Provider (PT): Wayland Denis PA   Encounter Date: 07/29/2020   PT End of Session - 07/29/20 1701    Visit Number 18    Number of Visits 19    Date for PT Re-Evaluation 08/13/20    Authorization Type 27 per year allowed OT and PT combined    Authorization Time Period 8 visits 07/01/20-10/20/20    Authorization - Visit Number 2    Authorization - Number of Visits 8    PT Start Time 6720    PT Stop Time 1730    PT Time Calculation (min) 45 min    Equipment Utilized During Treatment Gait belt;Other (comment)   Bilateral AFOs   Activity Tolerance Patient tolerated treatment well;No increased pain    Behavior During Therapy WFL for tasks assessed/performed           Past Medical History:  Diagnosis Date  . Neurogenic bladder   . Neurogenic bowel   . S/P VP shunt   . Spina bifida [facility])     Past Surgical History:  Procedure Laterality Date  . VENTRICULOPERITONEAL SHUNT      There were no vitals filed for this visit.   Subjective Assessment - 07/29/20 1700    Subjective Pt is doing well and reports no major updates. No falls since last visit. Her HEP has been going well. No specific questions concerns at this time.    Pertinent History Patient is a pleasant 24 year old female who presents for continuation of care for generalized weakness/ coordination deficits secondary to diagnosis of spina bifida. PMH includes VP shunt, spina bifida, and nuerogenic bowel/bladder. Patient was discharged from this clinic on 07/18/19 due to meeting therapy cap for insurance that year. Patient's request for a power chair was denied, is appealing the process. Has been doing the sitting exercises and laying down ones. Is biking when  not cold outside. She reports having multiple near falls when negotiating stairs at home. Patient must ascend/descend stairs to reach bedroom upstairs increasing her risk for falls daily.    Limitations Walking;Standing;House hold activities;Other (comment)    How long can you sit comfortably? n/a    How long can you stand comfortably? require use of bilateral lofstrand crutches    How long can you walk comfortably? dependent upon terrain    Patient Stated Goals Patient wants to be able to negotiate uneven terrain at school, cross thresholds in home environment, and negotiate steps in household     Currently in Pain? No/denies                 TREATMENT    Ther-ex  Supine mainly resisted straight leg abduction and adduction x10 bilateral; Hook lying bridges x10 with therapist blocking feet; Hook lying straight line leg raises x10 bilateral lower extremity; Hook lying clams with gentle manual resistance from therapist x10 bilateral lower extremity; Hook lying adductor squeezes with light manual resistance from therapist x10 bilateral lower extremity; Short arc quad over bolster with manual resistance from therapist x10 BLE; Bicycles with light assist from therapist x30 seconds; Standing mini squats without BUE support x 10; 8" forward step ups (6" step with 2" airex pad on top) with BUE support x 10 eachLE; Lunges with BUE support x 10  with each foot forward;   Neuromuscular Re-education  Semitandem balance without UE support alternating forward LE x 30s each; NBOS static balance without UE support with eyes closed x 30s; Half foam roller (flat side up) static balance without upper extremity support x30 seconds; Airex pad NBOS horizontal and vertical head turns x30 seconds each;   Pt educated throughout session about proper posture and technique with exercises. Improved exercise technique, movement at target joints, use of target muscles after min to mod verbal, visual,  tactile cues.    Pt demonstrates excellent motivation during session today. Session focused on strengthening exercises on mat table as well as in standing in the parallel bars.  AFOs left on during session today to maximize time well for exercise. Continued to practice the 8" step as this is what pt is struggling with at home. Continued to provide intermittent seated rest breaks throughout session. Patient encouraged to follow up as scheduled and continue with HEP.She will benefit from continued PT services to address these deficits and maintain her function at home and school.                         PT Short Term Goals - 05/21/20 1623      PT SHORT TERM GOAL #1   Title Patient will report compliance with HEP for continued strengthening and stability during functional mobility.     Time 6    Period Weeks    Status On-going    Target Date 07/02/20             PT Long Term Goals - 05/21/20 1623      PT LONG TERM GOAL #1   Title Patient will increase their FOTO score by >10 points to increase to 53% to demonstrate increased function and mobility for higher quality of life.    Baseline 11/20/19 42; 02/14/20: 53; 04/09/20: 53    Time 12    Period Weeks    Status Achieved      PT LONG TERM GOAL #2   Title Patient will increase Berg Balance score by > 6 points (32/56) to demonstrate decreased fall risk during functional activities.    Baseline 1/6: 26/56; 02/14/20: 30/56; 04/09/20: 30/56    Time 12    Period Weeks    Status Partially Met    Target Date 08/13/20      PT LONG TERM GOAL #3   Title Patient will be require no assist with ascend/descend flight of stairs using Least restrictive assistive device.    Baseline 1/6: step to pattern of ascend/descend with near falls; 02/14/20: step-to pattern with CGA; 04/09/20: step-to pattern with CGA    Time 12    Period Weeks    Status Partially Met    Target Date 08/13/20      PT LONG TERM GOAL #4   Title Patient will  complete a TUG test in < 12 seconds for independent mobility and decreased fall risk     Baseline 1/6: 16 seconds; 02/14/20: 14.9s; 04/09/20: 14.3s    Time 12    Period Weeks    Status Partially Met    Target Date 08/13/20      PT LONG TERM GOAL #5   Title Patient will increase fastest 10 meter walk test to >1.79ms as to improve gait speed for better community ambulation and to reduce fall risk.    Baseline 1/6: 0.55 m/s; 02/14/20: 0.63 m/s; 04/09/20: 0.67 m/s  Time 12    Period Weeks    Status Partially Met    Target Date 08/13/20                 Plan - 07/29/20 1702    Clinical Impression Statement Pt demonstrates excellent motivation during session today. Session focused on strengthening exercises on mat table as well as in standing in the parallel bars.  AFOs left on during session today to maximize time well for exercise. Continued to practice the 8" step as this is what pt is struggling with at home. Continued to provide intermittent seated rest breaks throughout session. Patient encouraged to follow up as scheduled and continue with HEP. She will benefit from continued PT services to address these deficits and maintain her function at home and school.    Personal Factors and Comorbidities Comorbidity 1;Past/Current Experience;Social Background;Time since onset of injury/illness/exacerbation;Fitness;Transportation    Comorbidities spina bifida    Examination-Activity Limitations Bend;Carry;Continence;Lift;Reach Overhead;Squat;Stairs    Examination-Participation Restrictions Church;Community Activity;Driving;Laundry;Meal Prep;Volunteer;Yard Work    Stability/Clinical Decision Making Stable/Uncomplicated    Rehab Potential Good    Clinical Impairments Affecting Rehab Potential weakness and decreased standing balance    PT Frequency Biweekly    PT Duration 12 weeks    PT Treatment/Interventions ADLs/Self Care Home Management;Aquatic Therapy;Biofeedback;Ultrasound;Moist  Heat;Iontophoresis 68m/ml Dexamethasone;Electrical Stimulation;DME Instruction;Gait training;Stair training;Functional mobility training;Neuromuscular re-education;Balance training;Therapeutic exercise;Therapeutic activities;Patient/family education;Orthotic Fit/Training;Wheelchair mobility training;Manual techniques;Dry needling;Passive range of motion;Energy conservation;Splinting;Taping;Vestibular;Visual/perceptual remediation/compensation    PT Next Visit Plan continue balance and strengthening    PT Home Exercise Plan Seated, supine, and core program: [email or web address]    Consulted and Agree with Plan of Care Patient           Patient will benefit from skilled therapeutic intervention in order to improve the following deficits and impairments:  Abnormal gait, Decreased mobility, Decreased activity tolerance, Decreased balance, Decreased endurance, Decreased coordination, Decreased range of motion, Difficulty walking, Decreased strength, Impaired flexibility, Increased muscle spasms, Impaired tone, Postural dysfunction, Improper body mechanics  Visit Diagnosis: Unsteadiness on feet  Muscle weakness (generalized)     Problem List There are no problems to display for this patient.  JPhillips GroutPT, DPT, GCS  Michelle Randall 07/30/2020, 1:47 PM  [place]MAIN [facility]Land[facility] FloridaSERVICES [address] [address]Phone: [phone]  Fax:  [phone] Name: Michelle TREESEMRN: [id number] of Birth: [birth year]

## 2020-08-12 ENCOUNTER — Ambulatory Visit: Payer: Medicaid Other

## 2020-08-12 ENCOUNTER — Other Ambulatory Visit: Payer: Self-pay

## 2020-08-12 DIAGNOSIS — R2689 Other abnormalities of gait and mobility: Secondary | ICD-10-CM

## 2020-08-12 DIAGNOSIS — M6281 Muscle weakness (generalized): Secondary | ICD-10-CM

## 2020-08-12 DIAGNOSIS — R2681 Unsteadiness on feet: Secondary | ICD-10-CM | POA: Diagnosis not present

## 2020-08-12 NOTE — Therapy (Signed)
[place] MAIN [facility] SERVICES [address] [place], [place], [address] Phone: [phone]   Fax:  [phone]  Physical Therapy Treatment/Recertification  Patient Details  Name: Michelle Randall MRN: [id number] Date of Birth: [birth year] Referring Provider (PT): Wayland Denis PA   Encounter Date: 08/12/2020   PT End of Session - 08/12/20 1654    Visit Number 19    Number of Visits 25    Date for PT Re-Evaluation 11/04/20    Authorization Type 27 per year allowed OT and PT combined    Authorization Time Period 8 visits 07/01/20-10/20/20    Authorization - Visit Number 4    Authorization - Number of Visits 8    PT Start Time 9357    PT Stop Time 1730    PT Time Calculation (min) 39 min    Equipment Utilized During Treatment Gait belt;Other (comment)   Bilateral AFOs   Activity Tolerance Patient tolerated treatment well;No increased pain    Behavior During Therapy WFL for tasks assessed/performed           Past Medical History:  Diagnosis Date   Neurogenic bladder    Neurogenic bowel    S/P VP shunt    Spina bifida [facility])     Past Surgical History:  Procedure Laterality Date   VENTRICULOPERITONEAL SHUNT      There were no vitals filed for this visit.   Subjective Assessment - 08/12/20 1654    Subjective Pt is doing well and reports no major updates.  She continues is struggling with the top step of her staircase at home but has discovered that she has an easier time if she goes sideways. No falls since last visit. Her HEP has been going well. No specific questions concerns at this time.    Pertinent History Patient is a pleasant 24 year old female who presents for continuation of care for generalized weakness/ coordination deficits secondary to diagnosis of spina bifida. PMH includes VP shunt, spina bifida, and nuerogenic bowel/bladder. Patient was discharged from this clinic on 07/18/19 due to meeting therapy cap for insurance  that year. Patient's request for a power chair was denied, is appealing the process. Has been doing the sitting exercises and laying down ones. Is biking when not cold outside. She reports having multiple near falls when negotiating stairs at home. Patient must ascend/descend stairs to reach bedroom upstairs increasing her risk for falls daily.    Limitations Walking;Standing;House hold activities;Other (comment)    How long can you sit comfortably? n/a    How long can you stand comfortably? require use of bilateral lofstrand crutches    How long can you walk comfortably? dependent upon terrain    Patient Stated Goals Patient wants to be able to negotiate uneven terrain at school, cross thresholds in home environment, and negotiate steps in household     Currently in Pain? No/denies               TREATMENT   Ther-ex Standing marching with BUE support in // bars x10bilateral; Mini squats in // bars with BUE supportx 10; Split squats with BUE support on // bars alternating forward LEx 10each; Step ups to 6" step with BUE support x 10 leading with each LE;   Neuromuscular Re-education  Forward/backward gait in // bars with BUE support 2 lengths eachdirection Sidestepping in // bars with BUE support x 2 lengths each direction; Updated outcome measures and goals with patient (see below)  TEST 02/14/20 04/09/20 08/12/20 Interpretation  5 times sit<>stand 10.0s 7.4s 8.6s >60 yo, >15 sec indicates increased risk for falls  10 meter walk test Self-selected: 18.1s = 0.55 m/s, Fastest: 15.8s = 0.63 m/s  Self-selected: 18.0s = 0.56 m/s, Fastest: 15.0s = 0.67  m/s  Self-selected: 15.4s = 0.65 m/s, Fastest: 13.2s = 0.76 m/s  <1.0 m/s indicates increased risk for falls; limited community ambulator  Timed up and Go 14.9s 14.3s 14.8s <14 sec indicates increased risk for falls  Berg Balance Assessment 30/56 30/56 30/56  <36/56 (100% risk for falls), 37-45 (80% risk for falls); 46-51 (>50%  risk for falls); 52-55 (lower risk <25% of falls)  FOTO 53 53 53 Goal achieved       Pt demonstrates excellent motivation during session today. She is able to complete all exercises as instructed today.Updated outcome measures and goals with patient today. Her TUG, BERG, 78mgait speed, and FOTO remained relatively consistent from the last time they were updated. Her 162mait speed actually did increase slightly. Pt shows nice maintenance of her functional outcome measures compared to the drop she experienced from a prolonged absence out of therapy during the end of last year. Pt requires physical therapy intervention to maintain her functional status including LE strength, balance, and gait speed/quality. She will benefit from continued PT services to address these deficits and [facility]                               PT Short Term Goals - 08/13/20 083419    PT SHORT TERM GOAL #1   Title Patient will report compliance with HEP for continued strengthening and stability during functional mobility.     Time 6    Period Weeks    Status On-going    Target Date 09/24/20             PT Long Term Goals - 08/13/20 0824      PT LONG TERM GOAL #1   Title Patient will increase their FOTO score by >10 points to increase to 53% to demonstrate increased function and mobility for higher quality of life.    Baseline 11/20/19 42; 02/14/20: 53; 04/09/20: 53; 08/12/20: 53    Time 12    Period Weeks    Status Achieved      PT LONG TERM GOAL #2   Title Patient will increase Berg Balance score by > 6 points (32/56) to demonstrate decreased fall risk during functional activities.    Baseline 1/6: 26/56; 02/14/20: 30/56; 04/09/20: 30/56; 08/13/20: 30/56    Time 12    Period Weeks    Status Partially Met    Target Date 11/04/20      PT LONG TERM GOAL #3   Title Patient will be require no assist with ascend/descend flight of stairs using Least  restrictive assistive device.    Baseline 1/6: step to pattern of ascend/descend with near falls; 02/14/20: step-to pattern with CGA; 04/09/20: step-to pattern with CGA; 08/13/20: step-to pattern with CGA    Time 12    Period Weeks    Status Partially Met    Target Date 11/04/20      PT LONG TERM GOAL #4   Title Patient will complete a TUG test in < 12 seconds for independent mobility and decreased fall risk     Baseline 1/6: 16 seconds; 02/14/20: 14.9s; 04/09/20: 14.3s; 08/13/20: 14.8s  Time 12    Period Weeks    Status Partially Met    Target Date 11/04/20      PT LONG TERM GOAL #5   Title Patient will increase fastest 10 meter walk test to >1.14ms as to improve gait speed for better community ambulation and to reduce fall risk.    Baseline 1/6: 0.55 m/s; 02/14/20: 0.63 m/s; 04/09/20: 0.67 m/s; 08/13/20: Fastest: 13.2s = 0.76 m/s    Time 12    Period Weeks    Status Partially Met    Target Date 11/04/20                 Plan - 08/12/20 1655    Clinical Impression Statement Pt demonstrates excellent motivation during session today. She is able to complete all exercises as instructed today. Updated outcome measures and goals with patient today. Her TUG, BERG, 139mait speed, and FOTO remained relatively consistent from the last time they were updated. Her 1064mit speed actually did increase slightly. Pt shows nice maintenance of her functional outcome measures compared to the drop she experienced from a prolonged absence out of therapy during the end of last year. Pt requires physical therapy intervention to maintain her functional status including LE strength, balance, and gait speed/quality. She will benefit from continued PT services to address these deficits and maintain her function at home and school.    Personal Factors and Comorbidities Comorbidity 1;Past/Current Experience;Social Background;Time since onset of injury/illness/exacerbation;Fitness;Transportation    Comorbidities spina  bifida    Examination-Activity Limitations Bend;Carry;Continence;Lift;Reach Overhead;Squat;Stairs    Examination-Participation Restrictions Church;Community Activity;Driving;Laundry;Meal Prep;Volunteer;Yard Work    Stability/Clinical Decision Making Stable/Uncomplicated    Rehab Potential Good    Clinical Impairments Affecting Rehab Potential weakness and decreased standing balance    PT Frequency Biweekly    PT Duration 12 weeks    PT Treatment/Interventions ADLs/Self Care Home Management;Aquatic Therapy;Biofeedback;Ultrasound;Moist Heat;Iontophoresis 4mg74m Dexamethasone;Electrical Stimulation;DME Instruction;Gait training;Stair training;Functional mobility training;Neuromuscular re-education;Balance training;Therapeutic exercise;Therapeutic activities;Patient/family education;Orthotic Fit/Training;Wheelchair mobility training;Manual techniques;Dry needling;Passive range of motion;Energy conservation;Splinting;Taping;Vestibular;Visual/perceptual remediation/compensation    PT Next Visit Plan continue balance and strengthening    PT Home Exercise Plan Seated, supine, and core program: [id number]    Consulted and Agree with Plan of Care Patient           Patient will benefit from skilled therapeutic intervention in order to improve the following deficits and impairments:  Abnormal gait, Decreased mobility, Decreased activity tolerance, Decreased balance, Decreased endurance, Decreased coordination, Decreased range of motion, Difficulty walking, Decreased strength, Impaired flexibility, Increased muscle spasms, Impaired tone, Postural dysfunction, Improper body mechanics  Visit Diagnosis: Unsteadiness on feet - Plan: PT plan of care cert/re-cert  Muscle weakness (generalized) - Plan: PT plan of care cert/re-cert  Other abnormalities of gait and mobility - Plan: PT plan of care cert/re-cert     Problem List There are no problems to display for this patient.  JasoPhillips Grout DPT, GCS    Kanai Berrios 08/13/2020, 9:05 AM  [facility] [address], [place]: [phone]:  [phone]: Michelle Randall: [phone] of Birth: [birth year]

## 2020-08-26 ENCOUNTER — Ambulatory Visit: Payer: Medicaid Other | Attending: Student

## 2020-08-26 ENCOUNTER — Other Ambulatory Visit: Payer: Self-pay

## 2020-08-26 DIAGNOSIS — R2681 Unsteadiness on feet: Secondary | ICD-10-CM | POA: Diagnosis present

## 2020-08-26 DIAGNOSIS — M6281 Muscle weakness (generalized): Secondary | ICD-10-CM | POA: Insufficient documentation

## 2020-08-26 NOTE — Therapy (Signed)
[place] MAIN [facility] SERVICES [address] [place], [place], [address] Phone: [phone]   Fax:  [phone]  Physical Therapy Treatment/Progress Note  Dates of reporting period  04/09/20   to   08/26/20  Patient Details  Name: Michelle Randall MRN: [id number] Date of Birth: Dec 03, 1995 Referring Provider (PT): Wayland Denis PA    Encounter Date: 08/26/2020    PT End of Session - 08/26/20 1704    Visit Number 20    Number of Visits 25    Date for PT Re-Evaluation 11/04/20    Authorization Type 27 per year allowed OT and PT combined    Authorization Time Period 8 visits 07/01/20-10/20/20    Authorization - Visit Number 5    Authorization - Number of Visits 8    PT Start Time 2595    PT Stop Time 1730    PT Time Calculation (min) 43 min    Equipment Utilized During Treatment Gait belt;Other (comment)   Bilateral AFOs   Activity Tolerance Patient tolerated treatment well;No increased pain    Behavior During Therapy WFL for tasks assessed/performed           Past Medical History:  Diagnosis Date  . Neurogenic bladder   . Neurogenic bowel   . S/P VP shunt   . Spina bifida [facility])     Past Surgical History:  Procedure Laterality Date  . VENTRICULOPERITONEAL SHUNT      There were no vitals filed for this visit.   Subjective Assessment - 08/27/20 1037    Subjective Pt is doing well and reports no major updates.  She denies any health or medication changes since her last therapy session.  No falls since last visit. Her HEP has been going well. No specific questions concerns at this time.    Pertinent History Patient is a pleasant 24 year old female who presents for continuation of care for generalized weakness/ coordination deficits secondary to diagnosis of spina bifida. PMH includes VP shunt, spina bifida, and nuerogenic bowel/bladder. Patient was discharged from this clinic on 07/18/19 due to meeting therapy cap for insurance that  year. Patient's request for a power chair was denied, is appealing the process. Has been doing the sitting exercises and laying down ones. Is biking when not cold outside. She reports having multiple near falls when negotiating stairs at home. Patient must ascend/descend stairs to reach bedroom upstairs increasing her risk for falls daily.    Limitations Walking;Standing;House hold activities;Other (comment)    How long can you sit comfortably? n/a    How long can you stand comfortably? require use of bilateral lofstrand crutches    How long can you walk comfortably? dependent upon terrain    Patient Stated Goals Patient wants to be able to negotiate uneven terrain at school, cross thresholds in home environment, and negotiate steps in household     Currently in Pain? No/denies            TREATMENT   Ther-ex  Supine SLR hip flexion x 10 BLE; Supine straight leg hip abduction x 10 BLE AROM;  Supine straight leg hip adduction x 10 BLE, with manual resistance; Supine heel slides with resisted extension x10 bilateral lower extremity; Hooklying SAQ over bolster x 10 BLE, with manual resistance; Hooklying bridges with arms at side x 10; Hooklying alternating heel taps starting in 90/90 position with cues from therapist to keep low back on mat table 2 x 10; Hooklying bicycles challenging pt throughout  to lower each leg closer to mat table without touching x 10;  AFO's removed for following exercises: Standing mini squats without BUE support x 10; Lunges with BUE support x 10 with each foot forward; 6" forward step ups with BUE support x 10 eachLE;    Neuromuscular Re-education  Forward/backward ambulation in // bars with BUE support x 2 lengths each; Lateral stepping in // bars with BUE support x 2 lengths each; Semitandem balance without UE support alternating forward LE x 30s each; NBOS static balance without UE support with eyes open x 30s; Airex pad WBOS static balance without UE  support x 30s;   Pt educated throughout session about proper posture and technique with exercises. Improved exercise technique, movement at target joints, use of target muscles after min to mod verbal, visual, tactile cues.    Pt demonstrates excellent motivation during session today. She is able to complete all exercises as instructed today.Session focused on strengthening exercises on mat table as well as in standing in the parallel bars. Also incorporated balance exercises today. Utilized combination of AFO's on and off during session. Updated outcome measures and goals during last session so no need to update on this date. Her TUG, BERG, 54mgait speed, and FOTO remained relatively consistent from the last time they were updated. Her 151mait speed actually did increase slightly. Pt shows nice maintenance of her functional outcome measures compared to the drop she experienced from a prolonged absence out of therapy during the end of last year. Pt requires physical therapy intervention to maintain her functional status including LE strength, balance, and gait speed/quality and has not achieved maximal benefit from therapy services. Shewill benefit fromcontinuedPT services to address these deficits and maintain herfunction at [employer]                                            PT Short Term Goals - 08/13/20 0824      PT SHORT TERM GOAL #1   Title Patient will report compliance with HEP for continued strengthening and stability during functional mobility.     Time 6    Period Weeks    Status On-going    Target Date 09/24/20             PT Long Term Goals - 08/13/20 0824      PT LONG TERM GOAL #1   Title Patient will increase their FOTO score by >10 points to increase to 53% to demonstrate increased function and mobility for higher quality of life.    Baseline 11/20/19 42; 02/14/20: 53; 04/09/20: 53; 08/12/20: 53    Time 12    Period Weeks      Status Achieved      PT LONG TERM GOAL #2   Title Patient will increase Berg Balance score by > 6 points (32/56) to demonstrate decreased fall risk during functional activities.    Baseline 1/6: 26/56; 02/14/20: 30/56; 04/09/20: 30/56; 08/13/20: 30/56    Time 12    Period Weeks    Status Partially Met    Target Date 11/04/20      PT LONG TERM GOAL #3   Title Patient will be require no assist with ascend/descend flight of stairs using Least restrictive assistive device.    Baseline 1/6: step to pattern of ascend/descend with near falls; 02/14/20: step-to pattern with CGA; 04/09/20: step-to  pattern with CGA; 08/13/20: step-to pattern with CGA    Time 12    Period Weeks    Status Partially Met    Target Date 11/04/20      PT LONG TERM GOAL #4   Title Patient will complete a TUG test in < 12 seconds for independent mobility and decreased fall risk     Baseline 1/6: 16 seconds; 02/14/20: 14.9s; 04/09/20: 14.3s; 08/13/20: 14.8s    Time 12    Period Weeks    Status Partially Met    Target Date 11/04/20      PT LONG TERM GOAL #5   Title Patient will increase fastest 10 meter walk test to >1.92ms as to improve gait speed for better community ambulation and to reduce fall risk.    Baseline 1/6: 0.55 m/s; 02/14/20: 0.63 m/s; 04/09/20: 0.67 m/s; 08/13/20: Fastest: 13.2s = 0.76 m/s    Time 12    Period Weeks    Status Partially Met    Target Date 11/04/20                 Plan - 08/26/20 1704    Clinical Impression Statement Pt demonstrates excellent motivation during session today. She is able to complete all exercises as instructed today. Session focused on strengthening exercises on mat table as well as in standing in the parallel bars. Also incorporated balance exercises today. Utilized combination of AFO's on and off during session. Updated outcome measures and goals during last session so no need to update on this date. Her TUG, BERG, 163mait speed, and FOTO remained relatively consistent  from the last time they were updated. Her 1060mit speed actually did increase slightly. Pt shows nice maintenance of her functional outcome measures compared to the drop she experienced from a prolonged absence out of therapy during the end of last year. Pt requires physical therapy intervention to maintain her functional status including LE strength, balance, and gait speed/quality and has not achieved maximal benefit from therapy services. She will benefit from continued PT services to address these deficits and maintain her function at home and school    Personal Factors and Comorbidities Comorbidity 1;Past/Current Experience;Social Background;Time since onset of injury/illness/exacerbation;Fitness;Transportation    Comorbidities spina bifida    Examination-Activity Limitations Bend;Carry;Continence;Lift;Reach Overhead;Squat;Stairs    Examination-Participation Restrictions Church;Community Activity;Driving;Laundry;Meal Prep;Volunteer;Yard Work    Stability/Clinical Decision Making Stable/Uncomplicated    Rehab Potential Good    Clinical Impairments Affecting Rehab Potential weakness and decreased standing balance    PT Frequency Biweekly    PT Duration 12 weeks    PT Treatment/Interventions ADLs/Self Care Home Management;Aquatic Therapy;Biofeedback;Ultrasound;Moist Heat;Iontophoresis 4mg33m Dexamethasone;Electrical Stimulation;DME Instruction;Gait training;Stair training;Functional mobility training;Neuromuscular re-education;Balance training;Therapeutic exercise;Therapeutic activities;Patient/family education;Orthotic Fit/Training;Wheelchair mobility training;Manual techniques;Dry needling;Passive range of motion;Energy conservation;Splinting;Taping;Vestibular;Visual/perceptual remediation/compensation    PT Next Visit Plan continue balance and strengthening    PT Home Exercise Plan Seated, supine, and core program: [id number]    Consulted and Agree with Plan of Care Patient           Patient  will benefit from skilled therapeutic intervention in order to improve the following deficits and impairments:  Abnormal gait, Decreased mobility, Decreased activity tolerance, Decreased balance, Decreased endurance, Decreased coordination, Decreased range of motion, Difficulty walking, Decreased strength, Impaired flexibility, Increased muscle spasms, Impaired tone, Postural dysfunction, Improper body mechanics  Visit Diagnosis: Unsteadiness on feet  Muscle weakness (generalized)     Problem List There are no problems to display for this patient.  JasoLyndel Saferich PT,  DPT, GCS  Chinedu Agustin 08/27/2020, 10:46 AM  [place] MAIN [facility] SERVICES [place], [place], [address] Phone: [phone]   Fax:  [phone]  Name: MARCELYN RUPPE MRN: [id number] Date of Birth: 1996-04-15

## 2020-09-09 ENCOUNTER — Other Ambulatory Visit: Payer: Self-pay

## 2020-09-09 ENCOUNTER — Ambulatory Visit: Payer: Medicaid Other

## 2020-09-09 DIAGNOSIS — R2681 Unsteadiness on feet: Secondary | ICD-10-CM | POA: Diagnosis not present

## 2020-09-09 DIAGNOSIS — M6281 Muscle weakness (generalized): Secondary | ICD-10-CM

## 2020-09-09 NOTE — Therapy (Signed)
[place] MAIN [facility] SERVICES [address] [place], [place], [address] Phone: [phone]   Fax:  [phone]  Physical Therapy Treatment  Patient Details  Name: Michelle Randall MRN: [id number] Date of Birth: 08-Oct-1996 Referring Provider (PT): Wayland Denis PA   Encounter Date: 09/09/2020   PT End of Session - 09/09/20 1653    Visit Number 21    Number of Visits 25    Date for PT Re-Evaluation 11/04/20    Authorization Type 27 per year allowed OT and PT combined    Authorization Time Period 8 visits 07/01/20-10/20/20    Authorization - Visit Number 6    Authorization - Number of Visits 8    PT Start Time 5597    PT Stop Time 1730    PT Time Calculation (min) 43 min    Equipment Utilized During Treatment Gait belt;Other (comment)   Bilateral AFOs   Activity Tolerance Patient tolerated treatment well;No increased pain    Behavior During Therapy WFL for tasks assessed/performed           Past Medical History:  Diagnosis Date  . Neurogenic bladder   . Neurogenic bowel   . S/P VP shunt   . Spina bifida [facility])     Past Surgical History:  Procedure Laterality Date  . VENTRICULOPERITONEAL SHUNT      There were no vitals filed for this visit.   Subjective Assessment - 09/09/20 1652    Subjective Pt is doing well and reports no major updates. She denies any health or medication changes since her last therapy session. No falls since last visit. Her HEP has been going well. No specific questions concerns at this time.    Pertinent History Patient is a pleasant 24 year old female who presents for continuation of care for generalized weakness/ coordination deficits secondary to diagnosis of spina bifida. PMH includes VP shunt, spina bifida, and nuerogenic bowel/bladder. Patient was discharged from this clinic on 07/18/19 due to meeting therapy cap for insurance that year. Patient's request for a power chair was denied, is appealing the  process. Has been doing the sitting exercises and laying down ones. Is biking when not cold outside. She reports having multiple near falls when negotiating stairs at home. Patient must ascend/descend stairs to reach bedroom upstairs increasing her risk for falls daily.    Limitations Walking;Standing;House hold activities;Other (comment)    How long can you sit comfortably? n/a    How long can you stand comfortably? require use of bilateral lofstrand crutches    How long can you walk comfortably? dependent upon terrain    Patient Stated Goals Patient wants to be able to negotiate uneven terrain at school, cross thresholds in home environment, and negotiate steps in household     Currently in Pain? No/denies             TREATMENT   Ther-ex  AFO's removed for following exercises: Standing mini squats without BUE support x 10; Lunges with BUE support x 10 with each foot forward; Forward/backward ambulation with BUE support x 2 lengths each; Side stepping x 2 lengths each direction with BUE support; Standing mini squats with BUE support x 10; 8" forward step ups with BUE support x 10 each LE; Soccer ball kicks in standing for LAQ as well as coordination x multiple bouts on each leg; Soccer ball kicks with therapist in standing with BUE support alternating LE x multiple bouts each;   Neuromuscular Re-education  NBOS  static balance without UE support with eyes open x 30s; WBOS static balance without UE support eyes open/closed x 30s each; Semitandem balance without UE support alternating forward LE x 30s each;   Pt educated throughout session about proper posture and technique with exercises. Improved exercise technique, movement at target joints, use of target muscles after min to mod verbal, visual, tactile cues.    Pt demonstrates excellent motivation during session today. She is able to complete all exercises as instructed today.Session focused on strengthening and balance  exercises in the parallel bars today. AFOs removed for all exercises today. She continues to demonstrate typical impairment in static and dynamic balance. Pt requires physical therapy intervention to maintain her functional status including LE strength, balance, and gait speed/quality and has not achieved maximal benefit from therapy services. Shewill benefit fromcontinuedPT services to address these deficits and maintain herfunction at [employer].                           PT Short Term Goals - 08/13/20 2536      PT SHORT TERM GOAL #1   Title Patient will report compliance with HEP for continued strengthening and stability during functional mobility.     Time 6    Period Weeks    Status On-going    Target Date 09/24/20             PT Long Term Goals - 08/13/20 0824      PT LONG TERM GOAL #1   Title Patient will increase their FOTO score by >10 points to increase to 53% to demonstrate increased function and mobility for higher quality of life.    Baseline 11/20/19 42; 02/14/20: 53; 04/09/20: 53; 08/12/20: 53    Time 12    Period Weeks    Status Achieved      PT LONG TERM GOAL #2   Title Patient will increase Berg Balance score by > 6 points (32/56) to demonstrate decreased fall risk during functional activities.    Baseline 1/6: 26/56; 02/14/20: 30/56; 04/09/20: 30/56; 08/13/20: 30/56    Time 12    Period Weeks    Status Partially Met    Target Date 11/04/20      PT LONG TERM GOAL #3   Title Patient will be require no assist with ascend/descend flight of stairs using Least restrictive assistive device.    Baseline 1/6: step to pattern of ascend/descend with near falls; 02/14/20: step-to pattern with CGA; 04/09/20: step-to pattern with CGA; 08/13/20: step-to pattern with CGA    Time 12    Period Weeks    Status Partially Met    Target Date 11/04/20      PT LONG TERM GOAL #4   Title Patient will complete a TUG test in < 12 seconds for independent mobility  and decreased fall risk     Baseline 1/6: 16 seconds; 02/14/20: 14.9s; 04/09/20: 14.3s; 08/13/20: 14.8s    Time 12    Period Weeks    Status Partially Met    Target Date 11/04/20      PT LONG TERM GOAL #5   Title Patient will increase fastest 10 meter walk test to >1.41ms as to improve gait speed for better community ambulation and to reduce fall risk.    Baseline 1/6: 0.55 m/s; 02/14/20: 0.63 m/s; 04/09/20: 0.67 m/s; 08/13/20: Fastest: 13.2s = 0.76 m/s    Time 12    Period Weeks    Status  Partially Met    Target Date 11/04/20                 Plan - 09/09/20 1655    Clinical Impression Statement Pt demonstrates excellent motivation during session today. She is able to complete all exercises as instructed today. Session focused on strengthening and balance exercises in the parallel bars today. AFOs removed for all exercises today. She continues to demonstrate typical impairment in static and dynamic balance. Pt requires physical therapy intervention to maintain her functional status including LE strength, balance, and gait speed/quality and has not achieved maximal benefit from therapy services. She will benefit from continued PT services to address these deficits and maintain her function at home and school.    Personal Factors and Comorbidities Comorbidity 1;Past/Current Experience;Social Background;Time since onset of injury/illness/exacerbation;Fitness;Transportation    Comorbidities spina bifida    Examination-Activity Limitations Bend;Carry;Continence;Lift;Reach Overhead;Squat;Stairs    Examination-Participation Restrictions Church;Community Activity;Driving;Laundry;Meal Prep;Volunteer;Yard Work    Stability/Clinical Decision Making Stable/Uncomplicated    Rehab Potential Good    Clinical Impairments Affecting Rehab Potential weakness and decreased standing balance    PT Frequency Biweekly    PT Duration 12 weeks    PT Treatment/Interventions ADLs/Self Care Home Management;Aquatic  Therapy;Biofeedback;Ultrasound;Moist Heat;Iontophoresis 38m/ml Dexamethasone;Electrical Stimulation;DME Instruction;Gait training;Stair training;Functional mobility training;Neuromuscular re-education;Balance training;Therapeutic exercise;Therapeutic activities;Patient/family education;Orthotic Fit/Training;Wheelchair mobility training;Manual techniques;Dry needling;Passive range of motion;Energy conservation;Splinting;Taping;Vestibular;Visual/perceptual remediation/compensation    PT Next Visit Plan continue balance and strengthening    PT Home Exercise Plan Seated, supine, and core program: [id number]    Consulted and Agree with Plan of Care Patient           Patient will benefit from skilled therapeutic intervention in order to improve the following deficits and impairments:  Abnormal gait, Decreased mobility, Decreased activity tolerance, Decreased balance, Decreased endurance, Decreased coordination, Decreased range of motion, Difficulty walking, Decreased strength, Impaired flexibility, Increased muscle spasms, Impaired tone, Postural dysfunction, Improper body mechanics  Visit Diagnosis: Unsteadiness on feet  Muscle weakness (generalized)     Problem List There are no problems to display for this patient.  JPhillips GroutPT, DPT, GCS  Huprich,Jason 09/10/2020, 9:17 AM  [place]MAIN [facility] CenterSERV[facility] [address]RHer[address] [place] [address]Phone: [phone]  Fax:  [phone] Name: Michelle GUPTONMRN: [id number] of Birth: 902-07-1996

## 2020-09-23 ENCOUNTER — Ambulatory Visit: Payer: Medicaid Other | Attending: Student

## 2020-09-23 ENCOUNTER — Other Ambulatory Visit: Payer: Self-pay

## 2020-09-23 DIAGNOSIS — M6281 Muscle weakness (generalized): Secondary | ICD-10-CM | POA: Diagnosis present

## 2020-09-23 DIAGNOSIS — R2681 Unsteadiness on feet: Secondary | ICD-10-CM | POA: Insufficient documentation

## 2020-09-24 NOTE — Therapy (Signed)
[place] MAIN [facility] SERVICES [address] [place], [place], [address] Phone: [phone]   Fax:  [phone]  Physical Therapy Treatment  Patient Details  Name: Michelle Randall MRN: [id number] Date of Birth: [birth year] Referring Provider (PT): Wayland Denis PA   Encounter Date: 09/23/2020   PT End of Session - 09/24/20 1632    Visit Number 22    Number of Visits 25    Date for PT Re-Evaluation 11/04/20    Authorization Type 27 per year allowed OT and PT combined    Authorization Time Period 8 visits 07/01/20-10/20/20    Authorization - Visit Number 7    Authorization - Number of Visits 8    PT Start Time 3716    PT Stop Time 1740    PT Time Calculation (min) 45 min    Equipment Utilized During Treatment Gait belt;Other (comment)   Bilateral AFOs   Activity Tolerance Patient tolerated treatment well;No increased pain    Behavior During Therapy WFL for tasks assessed/performed           Past Medical History:  Diagnosis Date  . Neurogenic bladder   . Neurogenic bowel   . S/P VP shunt   . Spina bifida [facility])     Past Surgical History:  Procedure Laterality Date  . VENTRICULOPERITONEAL SHUNT      There were no vitals filed for this visit.   Subjective Assessment - 09/23/20 1736    Subjective Pt is doing well and reports no major updates. She denies any health or medication changes since her last therapy session. No falls since last visit. Her HEP has been going well. No specific questions concerns at this time.    Pertinent History Patient is a pleasant 24 year old female who presents for continuation of care for generalized weakness/ coordination deficits secondary to diagnosis of spina bifida. PMH includes VP shunt, spina bifida, and nuerogenic bowel/bladder. Patient was discharged from this clinic on 07/18/19 due to meeting therapy cap for insurance that year. Patient's request for a power chair was denied, is appealing the  process. Has been doing the sitting exercises and laying down ones. Is biking when not cold outside. She reports having multiple near falls when negotiating stairs at home. Patient must ascend/descend stairs to reach bedroom upstairs increasing her risk for falls daily.    Limitations Walking;Standing;House hold activities;Other (comment)    How long can you sit comfortably? n/a    How long can you stand comfortably? require use of bilateral lofstrand crutches    How long can you walk comfortably? dependent upon terrain    Patient Stated Goals Patient wants to be able to negotiate uneven terrain at school, cross thresholds in home environment, and negotiate steps in household     Currently in Pain? No/denies              TREATMENT   Ther-ex  AFO's removed for following exercises: Standing mini squats without BUE support x 10; Lunges with BUE support x 10 with each foot forward; Forward/backward ambulation with BUE support x 2 lengths each; Side stepping x 2 lengths each direction with BUE support; 8" forward step ups with BUE support x 10 each LE;   Neuromuscular Re-education  NBOS static balance without UE support with eyes open x 30s; WBOS static balance without UE support eyes open/closed x 30s each; WBOS eyes open with horizontal and vertical head turns x 30s each;   Pt educated throughout session about  proper posture and technique with exercises. Improved exercise technique, movement at target joints, use of target muscles after min to mod verbal, visual, tactile cues.    Pt demonstrates excellent motivation during session today. She is able to complete all exercises as instructed today.Session focused on strengthening and balance exercises in the parallel bars today. AFOs removed for all exercises today. She continues to demonstrate typical impairment in static and dynamic balance. Next therapy session will be her last therapy appointment of 2021 and she will need updated  goals at that time. Pt requires physical therapy intervention to maintain her functional status including LE strength, balance, and gait speed/quality and has not achieved maximal benefit from therapy services. Shewill benefit fromcontinuedPT services to address these deficits and maintain herfunction at [employer].                              PT Short Term Goals - 08/13/20 2094      PT SHORT TERM GOAL #1   Title Patient will report compliance with HEP for continued strengthening and stability during functional mobility.     Time 6    Period Weeks    Status On-going    Target Date 09/24/20             PT Long Term Goals - 08/13/20 0824      PT LONG TERM GOAL #1   Title Patient will increase their FOTO score by >10 points to increase to 53% to demonstrate increased function and mobility for higher quality of life.    Baseline 11/20/19 42; 02/14/20: 53; 04/09/20: 53; 08/12/20: 53    Time 12    Period Weeks    Status Achieved      PT LONG TERM GOAL #2   Title Patient will increase Berg Balance score by > 6 points (32/56) to demonstrate decreased fall risk during functional activities.    Baseline 1/6: 26/56; 02/14/20: 30/56; 04/09/20: 30/56; 08/13/20: 30/56    Time 12    Period Weeks    Status Partially Met    Target Date 11/04/20      PT LONG TERM GOAL #3   Title Patient will be require no assist with ascend/descend flight of stairs using Least restrictive assistive device.    Baseline 1/6: step to pattern of ascend/descend with near falls; 02/14/20: step-to pattern with CGA; 04/09/20: step-to pattern with CGA; 08/13/20: step-to pattern with CGA    Time 12    Period Weeks    Status Partially Met    Target Date 11/04/20      PT LONG TERM GOAL #4   Title Patient will complete a TUG test in < 12 seconds for independent mobility and decreased fall risk     Baseline 1/6: 16 seconds; 02/14/20: 14.9s; 04/09/20: 14.3s; 08/13/20: 14.8s    Time 12    Period Weeks     Status Partially Met    Target Date 11/04/20      PT LONG TERM GOAL #5   Title Patient will increase fastest 10 meter walk test to >1.47ms as to improve gait speed for better community ambulation and to reduce fall risk.    Baseline 1/6: 0.55 m/s; 02/14/20: 0.63 m/s; 04/09/20: 0.67 m/s; 08/13/20: Fastest: 13.2s = 0.76 m/s    Time 12    Period Weeks    Status Partially Met    Target Date 11/04/20  Plan - 09/24/20 1634    Clinical Impression Statement Pt demonstrates excellent motivation during session today. She is able to complete all exercises as instructed today. Session focused on strengthening and balance exercises in the parallel bars today. AFOs removed for all exercises today. She continues to demonstrate typical impairment in static and dynamic balance. Next therapy session will be her last therapy appointment of 2021 and she will need updated goals at that time. Pt requires physical therapy intervention to maintain her functional status including LE strength, balance, and gait speed/quality and has not achieved maximal benefit from therapy services. She will benefit from continued PT services to address these deficits and maintain her function at home and school.    Personal Factors and Comorbidities Comorbidity 1;Past/Current Experience;Social Background;Time since onset of injury/illness/exacerbation;Fitness;Transportation    Comorbidities spina bifida    Examination-Activity Limitations Bend;Carry;Continence;Lift;Reach Overhead;Squat;Stairs    Examination-Participation Restrictions Church;Community Activity;Driving;Laundry;Meal Prep;Volunteer;Yard Work    Stability/Clinical Decision Making Stable/Uncomplicated    Rehab Potential Good    Clinical Impairments Affecting Rehab Potential weakness and decreased standing balance    PT Frequency Biweekly    PT Duration 12 weeks    PT Treatment/Interventions ADLs/Self Care Home Management;Aquatic  Therapy;Biofeedback;Ultrasound;Moist Heat;Iontophoresis 37m/ml Dexamethasone;Electrical Stimulation;DME Instruction;Gait training;Stair training;Functional mobility training;Neuromuscular re-education;Balance training;Therapeutic exercise;Therapeutic activities;Patient/family education;Orthotic Fit/Training;Wheelchair mobility training;Manual techniques;Dry needling;Passive range of motion;Energy conservation;Splinting;Taping;Vestibular;Visual/perceptual remediation/compensation    PT Next Visit Plan Outcome measures and update goals, continue balance and strengthening    PT Home Exercise Plan Seated, supine, and core program: [email or web address]    Consulted and Agree with Plan of Care Patient           Patient will benefit from skilled therapeutic intervention in order to improve the following deficits and impairments:  Abnormal gait, Decreased mobility, Decreased activity tolerance, Decreased balance, Decreased endurance, Decreased coordination, Decreased range of motion, Difficulty walking, Decreased strength, Impaired flexibility, Increased muscle spasms, Impaired tone, Postural dysfunction, Improper body mechanics  Visit Diagnosis: Unsteadiness on feet  Muscle weakness (generalized)     Problem List There are no problems to display for this patient.  JPhillips GroutPT, DPT, GCS  Michelle Randall 09/24/2020, 4:50 PM  CL[place]aMAIN [facility]ictor[facility] CenterSERVICES [address] [address]Phone: [phone]  Fax:  [phone] Name: Michelle SLOOPMRN: [id number] of Birth: 910-Jun-1997

## 2020-10-07 ENCOUNTER — Ambulatory Visit: Payer: Medicaid Other

## 2020-10-15 ENCOUNTER — Ambulatory Visit: Payer: Medicaid Other | Attending: Student

## 2020-10-15 ENCOUNTER — Other Ambulatory Visit: Payer: Self-pay

## 2020-10-15 DIAGNOSIS — M6281 Muscle weakness (generalized): Secondary | ICD-10-CM | POA: Diagnosis present

## 2020-10-15 DIAGNOSIS — R278 Other lack of coordination: Secondary | ICD-10-CM | POA: Diagnosis present

## 2020-10-15 DIAGNOSIS — R2689 Other abnormalities of gait and mobility: Secondary | ICD-10-CM | POA: Diagnosis present

## 2020-10-15 DIAGNOSIS — R2681 Unsteadiness on feet: Secondary | ICD-10-CM

## 2020-10-16 NOTE — Therapy (Signed)
[place] [facility] SERVICES [address] [place], [place], [address] Phone: [phone]   Fax:  [phone]  Physical Therapy Treatment/DISCHARGE  Patient Details  Name: Michelle Randall MRN: [id number] Date of Birth: 1996/08/11 Referring Provider (PT): Wayland Denis PA   Encounter Date: 10/15/2020   PT End of Session - 10/16/20 1325    Visit Number 23    Number of Visits 25    Date for PT Re-Evaluation 11/04/20    Authorization Type 27 per year allowed OT and PT combined    Authorization Time Period 8 visits 07/01/20-10/20/20    Authorization - Visit Number 8    Authorization - Number of Visits 8    PT Start Time 1245    PT Stop Time 1730    PT Time Calculation (min) 39 min    Equipment Utilized During Treatment Gait belt;Other (comment)   Bilateral AFOs   Activity Tolerance Patient tolerated treatment well;No increased pain    Behavior During Therapy WFL for tasks assessed/performed           Past Medical History:  Diagnosis Date  . Neurogenic bladder   . Neurogenic bowel   . S/P VP shunt   . Spina bifida [facility])     Past Surgical History:  Procedure Laterality Date  . VENTRICULOPERITONEAL SHUNT      There were no vitals filed for this visit.   Subjective Assessment - 10/16/20 0855    Subjective Patient reports her stair lift continues to be malfunctioning, is hoping for a new one. Has been practicing her walking and stairs since last session. Had some illness in her family resulting in her missing a session. No falls or LOB since last session. No questions or concerns, is aware today's session is last approved for year.    Pertinent History Patient is a pleasant 24 year old female who presents for continuation of care for generalized weakness/ coordination deficits secondary to diagnosis of spina bifida. PMH includes VP shunt, spina bifida, and nuerogenic bowel/bladder. Patient was discharged from this clinic on 07/18/19 due  to meeting therapy cap for insurance that year. Patient's request for a power chair was denied, is appealing the process. Has been doing the sitting exercises and laying down ones. Is biking when not cold outside. She reports having multiple near falls when negotiating stairs at home. Patient must ascend/descend stairs to reach bedroom upstairs increasing her risk for falls daily.    Limitations Walking;Standing;House hold activities;Other (comment)    How long can you sit comfortably? n/a    How long can you stand comfortably? require use of bilateral lofstrand crutches    How long can you walk comfortably? dependent upon terrain    Patient Stated Goals Patient wants to be able to negotiate uneven terrain at school, cross thresholds in home environment, and negotiate steps in household     Currently in Pain? No/denies              [facility] PT Assessment - 10/16/20 0001      Standardized Balance Assessment   Standardized Balance Assessment Berg Balance Test      Berg Balance Test   Sit to Stand Able to stand without using hands and stabilize independently    Standing Unsupported Able to stand safely 2 minutes    Sitting with Back Unsupported but Feet Supported on Floor or Stool Able to sit safely and securely 2 minutes    Stand to Sit Controls descent by  using hands    Transfers Able to transfer safely, definite need of hands    Standing Unsupported with Eyes Closed Able to stand 10 seconds with supervision    Standing Unsupported with Feet Together Needs help to attain position but able to stand for 30 seconds with feet together    From Standing, Reach Forward with Outstretched Arm Can reach forward >5 cm safely (2")    From Standing Position, Pick up Object from Floor Able to pick up shoe, needs supervision    From Standing Position, Turn to Look Behind Over each Shoulder Needs supervision when turning    Turn 360 Degrees Needs assistance while turning    Standing Unsupported,  Alternately Place Feet on Step/Stool Able to complete >2 steps/needs minimal assist    Standing Unsupported, One Foot in Front Able to take small step independently and hold 30 seconds    Standing on One Leg Tries to lift leg/unable to hold 3 seconds but remains standing independently    Total Score 32          Goals:    BERG: 32/ 56  Flight of stairs with LRAD TUG: 14.05   FOTO: 50.32  Treatment:   New HEP: Performed to demonstrate understanding:    Access Code: [email or web address] URL: [email or web address] Date: 10/15/2020 Prepared by: Janna Arch  Exercises  . Seated March - 1 x daily - 7 x weekly - 3 sets - 10 reps . Seated Long Arc Quad - 1 x daily - 7 x weekly - 3 sets - 10 reps . Seated Hip Abduction with Resistance - 1 x daily - 7 x weekly - 2 sets - 10 reps - 5 hold . Seated Shoulder Flexion Full Range - 1 x daily - 7 x weekly - 3 sets - 10 reps . Seated Shoulder Circles - 1 x daily - 7 x weekly - 3 sets - 10 reps . Seated Scapular Retraction - 1 x daily - 7 x weekly - 3 sets - 10 reps . Seated Hamstring Curls with Resistance - 1 x daily - 7 x weekly - 2 sets - 10 reps - 5 hold . Seated Gluteal Sets - 1 x daily - 7 x weekly - 2 sets - 10 reps - 5 hold . Standing March with Counter Support - 1 x daily - 7 x weekly - 2 sets - 10 reps - 5 hold . Forward Step Up with Counter Support - 1 x daily - 7 x weekly - 2 sets - 10 reps - 5 hold . Standing Hip Extension with Counter Support - 1 x daily - 7 x weekly - 2 sets - 10 reps - 5 hold . Side Stepping with Counter Support - 1 x daily - 7 x weekly - 2 sets - 10 reps - 5 hold . Supine Bridge - 1 x daily - 7 x weekly - 2 sets - 10 reps - 5 hold . Cat-Camel - 1 x daily - 7 x weekly - 2 sets - 10 reps - 5 hold Quadruped Alternating Arm Lift - 1 x daily - 7 x weekly - 2 sets - 10 reps - 5 hold     Today is patient's last session due to insurance coverage. Will discharge today with hopes of new referral in the new year for  continuation of care. Patient given HEP for meanwhile with her demonstrating understanding. She performs her goals, with improved balance as noted with BERG improvement. Her stair negotiation is improving  per patient report however continues to require use of railing and/or Lofstrand crutches. I will be happy to see this patient in the future as needed.                  PT Education - 10/16/20 1313    Education provided Yes    Education Details goals, discharge, HEP    Person(s) Educated Patient    Methods Explanation;Demonstration;Tactile cues;Verbal cues    Comprehension Verbalized understanding;Returned demonstration;Verbal cues required;Tactile cues required            PT Short Term Goals - 10/16/20 1320      PT SHORT TERM GOAL #1   Title Patient will report compliance with HEP for continued strengthening and stability during functional mobility.     Baseline HEP compliant    Time 6    Period Weeks    Status Achieved    Target Date 09/24/20             PT Long Term Goals - 10/16/20 1320      PT LONG TERM GOAL #1   Title Patient will increase their FOTO score by >10 points to increase to 53% to demonstrate increased function and mobility for higher quality of life.    Baseline 11/20/19 42; 02/14/20: 53; 04/09/20: 53; 08/12/20: 53 12/1: 50.32    Time 12    Period Weeks    Status Achieved      PT LONG TERM GOAL #2   Title Patient will increase Berg Balance score by > 6 points (32/56) to demonstrate decreased fall risk during functional activities.    Baseline 1/6: 26/56; 02/14/20: 30/56; 04/09/20: 30/56; 08/13/20: 30/56 12/2: 32/56    Time 12    Period Weeks    Status Achieved      PT LONG TERM GOAL #3   Title Patient will be require no assist with ascend/descend flight of stairs using Least restrictive assistive device.    Baseline 1/6: step to pattern of ascend/descend with near falls; 02/14/20: step-to pattern with CGA; 04/09/20: step-to pattern with CGA; 08/13/20:  step-to pattern with CGA 12/1: requires use of Lofstrand crutches and/or railing    Time 12    Period Weeks    Status Partially Met      PT LONG TERM GOAL #4   Title Patient will complete a TUG test in < 12 seconds for independent mobility and decreased fall risk     Baseline 1/6: 16 seconds; 02/14/20: 14.9s; 04/09/20: 14.3s; 08/13/20: 14.8s 12/1: 14.05    Time 12    Period Weeks    Status Partially Met      PT LONG TERM GOAL #5   Title Patient will increase fastest 10 meter walk test to >1.75ms as to improve gait speed for better community ambulation and to reduce fall risk.    Baseline 1/6: 0.55 m/s; 02/14/20: 0.63 m/s; 04/09/20: 0.67 m/s; 08/13/20: Fastest: 13.2s = 0.76 m/s    Time 12    Period Weeks    Status Partially Met                 Plan - 10/16/20 1327    Clinical Impression Statement Today is patient's last session due to insurance coverage. Will discharge today with hopes of new referral in the new year for continuation of care. Patient given HEP for meanwhile with her demonstrating understanding. She performs her goals, with improved balance as noted with BERG improvement. Her stair negotiation is improving per patient  report however continues to require use of railing and/or Lofstrand crutches. I will be happy to see this patient in the future as needed.    Personal Factors and Comorbidities Comorbidity 1;Past/Current Experience;Social Background;Time since onset of injury/illness/exacerbation;Fitness;Transportation    Comorbidities spina bifida    Examination-Activity Limitations Bend;Carry;Continence;Lift;Reach Overhead;Squat;Stairs    Examination-Participation Restrictions Church;Community Activity;Driving;Laundry;Meal Prep;Volunteer;Yard Work    Stability/Clinical Decision Making Stable/Uncomplicated    Rehab Potential Good    Clinical Impairments Affecting Rehab Potential weakness and decreased standing balance    PT Frequency Biweekly    PT Duration 12 weeks    PT  Treatment/Interventions ADLs/Self Care Home Management;Aquatic Therapy;Biofeedback;Ultrasound;Moist Heat;Iontophoresis 68m/ml Dexamethasone;Electrical Stimulation;DME Instruction;Gait training;Stair training;Functional mobility training;Neuromuscular re-education;Balance training;Therapeutic exercise;Therapeutic activities;Patient/family education;Orthotic Fit/Training;Wheelchair mobility training;Manual techniques;Dry needling;Passive range of motion;Energy conservation;Splinting;Taping;Vestibular;Visual/perceptual remediation/compensation    PT Next Visit Plan Outcome measures and update goals, continue balance and strengthening    PT Home Exercise Plan Seated, supine, and core program: [email or web address]    Consulted and Agree with Plan of Care Patient           Patient will benefit from skilled therapeutic intervention in order to improve the following deficits and impairments:  Abnormal gait, Decreased mobility, Decreased activity tolerance, Decreased balance, Decreased endurance, Decreased coordination, Decreased range of motion, Difficulty walking, Decreased strength, Impaired flexibility, Increased muscle spasms, Impaired tone, Postural dysfunction, Improper body mechanics  Visit Diagnosis: Unsteadiness on feet  Muscle weakness (generalized)  Other abnormalities of gait and mobility  Other lack of coordination     Problem List There are no problems to display for this patient.  MJanna Arch PT, DPT   10/16/2020, 1:35 PM  [facility]MAIN [facility]ERVICES [address] [place] [address]Phone: [phone]  Fax:  [phone] Name: MGEMA RINGOLDMRN: [id number] of Birth: [birth year]

## 2020-10-21 ENCOUNTER — Ambulatory Visit: Payer: Medicaid Other

## 2020-11-04 ENCOUNTER — Ambulatory Visit: Payer: Medicaid Other

## 2020-11-18 ENCOUNTER — Ambulatory Visit: Payer: Medicaid Other

## 2020-12-02 ENCOUNTER — Ambulatory Visit: Payer: Medicaid Other

## 2020-12-11 ENCOUNTER — Other Ambulatory Visit: Payer: Self-pay

## 2020-12-11 ENCOUNTER — Ambulatory Visit: Payer: Medicaid Other | Attending: Student

## 2020-12-11 DIAGNOSIS — R2681 Unsteadiness on feet: Secondary | ICD-10-CM | POA: Insufficient documentation

## 2020-12-11 DIAGNOSIS — R2689 Other abnormalities of gait and mobility: Secondary | ICD-10-CM | POA: Insufficient documentation

## 2020-12-11 DIAGNOSIS — M6281 Muscle weakness (generalized): Secondary | ICD-10-CM | POA: Diagnosis present

## 2020-12-11 NOTE — Therapy (Signed)
[facility] [address]. [place], [place], [address] Phone: [phone]   Fax:  [phone]  Physical Therapy Evaluation  Patient Details  Name: Michelle Randall MRN: [id number] Date of Birth: 03/06/1996 Referring Provider (PT): Carren Rang PA   Encounter Date: 12/11/2020   PT End of Session - 12/11/20 1459    Visit Number 1    Number of Visits 7    Date for PT Re-Evaluation 03/05/21    Authorization Type eval: 12/11/20    PT Start Time 0930    PT Stop Time 1025    PT Time Calculation (min) 55 min    Equipment Utilized During Treatment Gait belt;Other (comment)   Bilateral AFOs   Activity Tolerance Patient tolerated treatment well    Behavior During Therapy WFL for tasks assessed/performed           Past Medical History:  Diagnosis Date  . Neurogenic bladder   . Neurogenic bowel   . S/P VP shunt   . Spina bifida [facility])     Past Surgical History:  Procedure Laterality Date  . VENTRICULOPERITONEAL SHUNT      There were no vitals filed for this visit.    Subjective Assessment - 12/11/20 0937    Subjective Balance and weakness    Pertinent History Patient is a pleasant 25 year old female who presents for continuation of care for generalized weakness/ coordination deficits secondary to diagnosis of spina bifida. PMH includes VP shunt, spina bifida, and neurogenic bowel/bladder. Patient was discharged from this clinic at the end of 2021 due to meeting therapy cap for insurance that year. Patient reports that she has continued performing her HEP but notes a decline when she stops therapy due to inability to safely perform all of the balance and strength exercises as at home. One of the biggest challenge she is experiencing is stair navigation  She reports having multiple near falls when negotiating stairs at home. Patient must ascend/descend stairs to reach bedroom upstairs increasing her risk for falls daily.    Limitations  Walking;Standing;House hold activities;Other (comment)   Stairs   How long can you sit comfortably? n/a    How long can you stand comfortably? require use of bilateral lofstrand crutches    How long can you walk comfortably? dependent upon terrain    Patient Stated Goals Patient wants to be able to negotiate uneven terrain, cross thresholds in home environment, and negotiate steps in household    Currently in Pain? No/denies               [facility] PT Assessment - 12/11/20 1006      Assessment   Medical Diagnosis Unsteadiness    Referring Provider (PT) Carren Rang PA    Onset Date/Surgical Date 12-21-1996    Hand Dominance Right    Next MD Visit Not reported    Prior Therapy Yes, pt is well known to this clinic      Precautions   Precautions Fall;Other (comment)   Shunt   Required Braces or Orthoses Other Brace/Splint    Other Brace/Splint Bilateral AFOs      Restrictions   Weight Bearing Restrictions No      Balance Screen   Has the patient had a decrease in activity level because of a fear of falling?  No    Is the patient reluctant to leave their home because of a fear of falling?  No      Home Environment  Living Environment Private residence    Research officer, trade union;Other relatives    Available Help at Discharge Family    Type of Home House    Home Layout Two level;Bed/bath upstairs    Home Equipment Wheelchair - power      Prior Function   Level of Independence Requires assistive device for independence    Vocation Student    Leisure PreCOVID: Hanging out with friends, Post COVID: Netflix movies, playing video games, reading      Cognition   Overall Cognitive Status Within Functional Limits for tasks assessed      [employer]   Sit to Stand Able to stand without using hands and stabilize independently    Standing Unsupported Able to stand safely 2 minutes    Sitting with Back Unsupported but Feet Supported on Floor or Stool Able to sit safely and  securely 2 minutes    Stand to Sit Controls descent by using hands    Transfers Able to transfer safely, definite need of hands    Standing Unsupported with Eyes Closed Able to stand 10 seconds with supervision    Standing Unsupported with Feet Together Needs help to attain position but able to stand for 30 seconds with feet together    From Standing, Reach Forward with Outstretched Arm Can reach forward >5 cm safely (2")    From Standing Position, Pick up Object from Floor Able to pick up shoe, needs supervision    From Standing Position, Turn to Look Behind Over each Shoulder Needs supervision when turning    Turn 360 Degrees Needs assistance while turning    Standing Unsupported, Alternately Place Feet on Step/Stool Able to complete >2 steps/needs minimal assist    Standing Unsupported, One Foot in Front Able to take small step independently and hold 30 seconds    Standing on One Leg Tries to lift leg/unable to hold 3 seconds but remains standing independently    Total Score 32             SUBJECTIVE Patient is a pleasant 25 year old female who presents for continuation of care for generalized weakness/ coordination deficits secondary to diagnosis of spina bifida. PMH includes VP shunt, spina bifida, and neurogenic bowel/bladder. Patient was discharged from this clinic at the end of 2021 due to meeting therapy cap for insurance that year. Patient reports that she has continued performing her HEP but notes a decline when she stops therapy due to inability to safely perform all of the balance and strength exercises as at home. One of the biggest challenge she is experiencing is stair navigation  She reports having multiple near falls when negotiating stairs at home. Patient must ascend/descend stairs to reach bedroom upstairs increasing her risk for falls daily.   PAIN: Patient reports no pain    POSTURE: Seated: forward head and rounded shoulders, AFOs on bilaterally Standing: trunk  flexion with lumbar lordosis. Forward flexion with use of bilateral Lofstrand Crutches.    MUSCULOSKELETAL: Tremor: Absent Bulk: Decrease muscle bulk throughout BLE;   NEUROLOGICAL: Mental Status Patient is oriented to person, place and time.  Recent memory is intact.  Remote memory is intact.  Attention span and concentration are intact.  Expressive speech is intact.  Patient's fund of knowledge is within normal limits for educational level.  Cranial Nerves Visual acuity and visual fields are intact  Extraocular muscles are intact  Facial sensation is intact bilaterally  Facial strength is intact bilaterally  Hearing is normal  as tested by gross conversation  Sensation Deferred  Reflexes Deferred  Coordination/Cerebellar Deferred   STRENGTH:  Graded on a 0-5 scale Muscle Group Left Right  Hip Flex 4-/5 3+/5  Hip Abd 2+/5 2+/5  Hip Add 4+/5 4+/5  Hip Ext 3/5 2+/5  Hip ER 3/5 3/5  Knee Flex 3/5 3+/5  Knee Ext 4/5 4/5  Ankle DF AFO AFO  Ankle PF AFO AFO    FUNCTIONAL MOBILITY:  Pt is able to perform sit to stand without UE support but is much safer with use of UEs, and ideally lofstrand crutches. She requires UE assist for safe transfers between surfaces. Stair assessment deferred to next session.   BALANCE:     Dynamic Sitting Balance  Normal Able to sit unsupported and weight shift across midline maximally   Good Able to sit unsupported and weight shift across midline moderately   Good-/Fair+ Able to sit unsupported and weight shift across midline minimally x  Fair Minimal weight shifting ipsilateral/front, difficulty crossing midline   Fair- Reach to ipsilateral side and unable to weight shift   Poor + Able to sit unsupported with min A and reach to ipsilateral side, unable to weight shift   Poor Able to sit unsupported with mod A and reach ipsilateral/front-can't cross midline         Standing Dynamic Balance  Normal Stand independently  unsupported, able to weight shift and cross midline maximally   Good Stand independently unsupported, able to weight shift and cross midline moderately   Good-/Fair+ Stand independently unsupported, able to weight shift across midline minimally   Fair Stand independently unsupported, weight shift, and reach ipsilaterally, loss of balance when crossing midline   Poor+ Able to stand with Min A and reach ipsilaterally, unable to weight shift x  Poor Able to stand with Mod A and minimally reach ipsilaterally, unable to cross midline.         Static Sitting Balance  Normal Able to maintain balance against maximal resistance   Good Able to maintain balance against moderate resistance   Good-/Fair+ Accepts minimal resistance x  Fair Able to sit unsupported without balance loss and without UE support   Poor+ Able to maintain with Minimal assistance from individual or chair   Poor Unable to maintain balance-requires mod/max support from individual or chair         Static Standing Balance  Normal Able to maintain standing balance against maximal resistance   Good Able to maintain standing balance against moderate resistance   Good-/Fair+ Able to maintain standing balance against minimal resistance   Fair Able to stand unsupported without UE support and without LOB for 1-2 min x  Fair- Requires Min A and UE support to maintain standing without loss of balance   Poor+ Requires mod A and UE support to maintain standing without loss of balance   Poor Requires max A and UE support to maintain standing balance without loss      GAIT: Patient ambulates with use of bilateral Lofstrand crutches with standard steppage pattern, excessive external rotation of BLE's with limited foot clearance of LLE.    OUTCOME MEASURES: TEST Outcome Interpretation  5 times sit<>stand 10.6s WNL, however pt has one LOB requiring therapist assist to prevent fall  10 meter walk test 14.6s = 0.68  m/s <1.0 m/s indicates increased risk for falls; limited community ambulator  Timed up and Go 13.4s WNL, use of lofstrand crutches, >14 sec indicates increased risk for falls  6 minute  walk test Deferred 1000 feet is community ambulator  BERG Balance Assessment 32/56 <36/56 (100% risk for falls), 37-45 (80% risk for falls); 46-51 (>50% risk for falls); 52-55 (lower risk <25% of falls)  FOTO 51 Goal is to reach 53%    Patient is a pleasant 25 year old female who presents for continuation of care for generalized weakness/ coordination deficits secondary to diagnosis of spina bifida.  Patient was discharged at the end of last year due to reaching insurance limit. Patient demonstrates deficits in mobility, strength, and safe negotiation of home environment today. Therapy has previously discharged patient during prior episodes of care however upon resumption of therapy after short absence she has demonstrated significant decline in functional mobility and safety with multiple near LOBs and near falls up/down stairs.  During evaluation today pt demonstrates significant hip flexor weakness during step ups requiring max BUE support. She compensates for weak hip flexors bilaterally by performing bilateral hip circumduction, anterior pelvic tilt and lateral trunk leaning during 6" step ups requiring mod verbal and tactile cueing.  Her gait speed is below what is necessary for full community mobility.  Balance deficits identified by BERG score of 32/56.  Patient requires continuation of physical therapy services in order to prevent decline in strength, balance, and mobility so she can maintain function at home and school and decrease her risk for future falls.      Objective measurements completed on examination: See above findings.               PT Education - 12/11/20 1459    Education provided Yes    Education Details Plan of care    Person(s) Educated Patient    Methods Explanation     Comprehension Verbalized understanding            PT Short Term Goals - 12/11/20 1502      PT SHORT TERM GOAL #1   Title Patient will report compliance with HEP for continued strengthening and stability during functional mobility.     Time 6    Period Weeks    Status New    Target Date 01/22/21             PT Long Term Goals - 12/11/20 1502      PT LONG TERM GOAL #1   Title Patient will increase their FOTO score to at least 59 to demonstrate increased function and mobility for higher quality of life.    Baseline 12/11/20: 51    Time 12    Period Weeks    Status New    Target Date 03/05/21      PT LONG TERM GOAL #2   Title Patient will maintain Berg Balance score of at least 32/56 in order to demonstrate decreased fall risk during functional activities.    Baseline 12/11/20: 32/56    Time 12    Period Weeks    Status New    Target Date 03/05/21      PT LONG TERM GOAL #3   Title Patient will be able to maintain safe performance with ascending/descending a flight of stairs using lofstrand crutches and railing in order to allow for safe navigation of her home environment.    Baseline 12/11/20: currently able to use lofstrands and/or railing with supervision by family    Time 12    Period Weeks    Status New    Target Date 03/05/21      PT LONG TERM GOAL #4  Title Patient will complete a TUG test in no more than 13.5 seconds for independent mobility and decreased fall risk maintenance    Baseline 12/11/20: 13.4s    Time 12    Period Weeks    Status New    Target Date 03/05/21      PT LONG TERM GOAL #5   Title Patient will increase fastest 10 meter walk test to >0.81 m/s as to improve gait speed for better community ambulation and to reduce fall risk.    Baseline 12/11/20: 14.6s = 0.68 m/s    Time 12    Period Weeks    Status New    Target Date 03/05/21                  Plan - 12/11/20 1500    Clinical Impression Statement Patient is a pleasant 25 year  old female who presents for continuation of care for generalized weakness/ coordination deficits secondary to diagnosis of spina bifida.  Patient was discharged at the end of last year due to reaching insurance limit. Patient demonstrates deficits in mobility, strength, and safe negotiation of home environment today. Therapy has previously discharged patient during prior episodes of care however upon resumption of therapy after short absence she has demonstrated significant decline in functional mobility and safety with multiple near LOBs and near falls up/down stairs.  During evaluation today pt demonstrates significant hip flexor weakness during step ups requiring max BUE support. She compensates for weak hip flexors bilaterally by performing bilateral hip circumduction, anterior pelvic tilt and lateral trunk leaning during 6" step ups requiring mod verbal and tactile cueing.  Her gait speed is below what is necessary for full community mobility.  Balance deficits identified by BERG score of 32/56.  Patient requires continuation of physical therapy services in order to prevent decline in strength, balance, and mobility so she can maintain function at home and school and decrease her risk for future falls.    Personal Factors and Comorbidities Comorbidity 1;Past/Current Experience;Time since onset of injury/illness/exacerbation;Transportation;Social Background    Comorbidities spina bifida    Examination-Activity Limitations Bend;Carry;Continence;Lift;Reach Overhead;Squat;Stairs;Stand;Transfers    Examination-Participation Restrictions Church;Community Activity;Driving;Laundry;Meal Prep;Volunteer;Yard Work    Stability/Clinical Decision Making Stable/Uncomplicated    Optometrist Low    Rehab Potential Good    Clinical Impairments Affecting Rehab Potential weakness and decreased standing balance    PT Frequency Biweekly    PT Duration 12 weeks    PT Treatment/Interventions ADLs/Self Care Home  Management;Aquatic Therapy;Biofeedback;Ultrasound;Moist Heat;Iontophoresis 4mg /ml Dexamethasone;Electrical Stimulation;DME Instruction;Gait training;Stair training;Functional mobility training;Neuromuscular re-education;Balance training;Therapeutic exercise;Therapeutic activities;Patient/family education;Orthotic Fit/Training;Wheelchair mobility training;Manual techniques;Dry needling;Passive range of motion;Energy conservation;Splinting;Taping;Vestibular;Visual/perceptual remediation/compensation;Canalith Repostioning;Cryotherapy    PT Next Visit Plan , Review HEP, balance and strength exercises    PT Home Exercise Plan Seated, supine, and core program: [id number]    Consulted and Agree with Plan of Care Patient           Patient will benefit from skilled therapeutic intervention in order to improve the following deficits and impairments:  Abnormal gait,Decreased mobility,Decreased balance,Decreased coordination,Difficulty walking,Decreased strength,Impaired tone,Postural dysfunction,Improper body mechanics  Visit Diagnosis: Unsteadiness on feet  Muscle weakness (generalized)  Other abnormalities of gait and mobility     Problem List There are no problems to display for this patient.  Sharalyn Ink Orvan Papadakis PT, DPT, GCS  Aariv Medlock 12/11/2020, 5:14 PM   [facility] [address] [place], [place], [address] Phone: [phone]   Fax:  [phone]  Name:  Michelle Randall MRN: [id number] Date of Birth: 1996-01-17

## 2020-12-18 ENCOUNTER — Ambulatory Visit: Payer: Medicaid Other

## 2020-12-24 NOTE — Patient Instructions (Incomplete)
Review HEP, balance and strength exercises

## 2020-12-25 ENCOUNTER — Ambulatory Visit: Payer: Medicaid Other | Attending: Student

## 2020-12-25 ENCOUNTER — Other Ambulatory Visit: Payer: Self-pay

## 2020-12-25 DIAGNOSIS — R2681 Unsteadiness on feet: Secondary | ICD-10-CM | POA: Insufficient documentation

## 2020-12-25 DIAGNOSIS — M6281 Muscle weakness (generalized): Secondary | ICD-10-CM | POA: Insufficient documentation

## 2020-12-26 NOTE — Therapy (Signed)
[facility] [address] [place] [address] [place], [place], [address] Phone: [phone]   Fax:  [phone]  Physical Therapy Treatment  Patient Details  Name: Michelle Randall MRN: [id number] Date of Birth: 1995/12/04 Referring Provider (PT): Carren Rang PA   Encounter Date: 12/25/2020   PT End of Session - 12/26/20 1855    Visit Number 2    Number of Visits 7    Date for PT Re-Evaluation 03/05/21    Authorization Type eval: 12/11/20    Authorization Time Period 3 visits 12/26/19-01/22/20    Authorization - Visit Number 1    Authorization - Number of Visits 3    PT Start Time 1650    PT Stop Time 1730    PT Time Calculation (min) 40 min    Equipment Utilized During Treatment Gait belt;Other (comment)   Bilateral AFOs   Activity Tolerance Patient tolerated treatment well    Behavior During Therapy WFL for tasks assessed/performed           Past Medical History:  Diagnosis Date  . Neurogenic bladder   . Neurogenic bowel   . S/P VP shunt   . Spina bifida [facility])     Past Surgical History:  Procedure Laterality Date  . VENTRICULOPERITONEAL SHUNT      There were no vitals filed for this visit.   Subjective Assessment - 12/26/20 1852    Subjective Pt reports that she is doing well today. She denies any pain upon arrival. She did [place] the airport [place] after slipping on a slick floor that was covered in stainless steel metal cleaner. She reports a bruise on her R hip with pain when rolling onto her side. She denies any increase in hip pain with ambulation.    Pertinent History Patient is a pleasant 25 year old female who presents for continuation of care for generalized weakness/ coordination deficits secondary to diagnosis of spina bifida. PMH includes VP shunt, spina bifida, and neurogenic bowel/bladder. Patient was discharged from this clinic at the end of 2021 due to meeting therapy cap for insurance  that year. Patient reports that she has continued performing her HEP but notes a decline when she stops therapy due to inability to safely perform all of the balance and strength exercises as at home. One of the biggest challenge she is experiencing is stair navigation  She reports having multiple near falls when negotiating stairs at home. Patient must ascend/descend stairs to reach bedroom upstairs increasing her risk for falls daily.    Limitations Walking;Standing;House hold activities;Other (comment)   Stairs   How long can you sit comfortably? n/a    How long can you stand comfortably? require use of bilateral lofstrand crutches    How long can you walk comfortably? dependent upon terrain    Patient Stated Goals Patient wants to be able to negotiate uneven terrain, cross thresholds in home environment, and negotiate steps in household    Currently in Pain? No/denies                TREATMENT   Ther-ex  Hooklying pilates 100's; Hooklying marching x 10 BLE; Hooklying SLR x 10 BLE; Supine straight leg hip abduction and adduction with light manual resistance x 10 each BLE; Hooklying bridges x 10; Hooklying manually resisted lumbothoracic rotation 5s hold x 5 each direction; Hooklying clams with manual resistance x 10; Hooklying adductor squeezes with manual resistance x 10; Standing mini squats  without BUE support x 10; Lunges with BUE support x 10 with each foot forward; 6" forward step ups with BUE support x 10 each LE; Total Gym L2 BLE squats x 10 (AFO's removed); Total Gym L2 single leg squats x 10 BLE (AFO's removed); Total Gym L2 double leg jumps 2 x 10 BLE (AFO's removed);   Pt educated throughout session about proper posture and technique with exercises. Improved exercise technique, movement at target joints, use of target muscles after min to mod verbal, visual, tactile cues.    Pt demonstrates excellent motivation during session today. She is able to complete all  exercises as instructed today.Session focused on strengthening. Introduced squats and jumps on the Total Gym and AFOs were removed for these exercises. She continues to demonstrate typical impairment in static and dynamic positions requiring BUE support. Pt requires physical therapy interventions to maintain her functional status including LE strength, balance, and gait speed/quality and has not achieved maximal benefit from therapy services. Shewill benefit fromcontinuedPT services to address these deficits and maintain herfunction at [employer].                                   PT Short Term Goals - 12/11/20 1502      PT SHORT TERM GOAL #1   Title Patient will report compliance with HEP for continued strengthening and stability during functional mobility.     Time 6    Period Weeks    Status New    Target Date 01/22/21             PT Long Term Goals - 12/11/20 1502      PT LONG TERM GOAL #1   Title Patient will increase their FOTO score to at least 59 to demonstrate increased function and mobility for higher quality of life.    Baseline 12/11/20: 51    Time 12    Period Weeks    Status New    Target Date 03/05/21      PT LONG TERM GOAL #2   Title Patient will maintain Berg Balance score of at least 32/56 in order to demonstrate decreased fall risk during functional activities.    Baseline 12/11/20: 32/56    Time 12    Period Weeks    Status New    Target Date 03/05/21      PT LONG TERM GOAL #3   Title Patient will be able to maintain safe performance with ascending/descending a flight of stairs using lofstrand crutches and railing in order to allow for safe navigation of her home environment.    Baseline 12/11/20: currently able to use lofstrands and/or railing with supervision by family    Time 12    Period Weeks    Status New    Target Date 03/05/21      PT LONG TERM GOAL #4   Title Patient will complete a TUG test in no more than  13.5 seconds for independent mobility and decreased fall risk maintenance    Baseline 12/11/20: 13.4s    Time 12    Period Weeks    Status New    Target Date 03/05/21      PT LONG TERM GOAL #5   Title Patient will increase fastest 10 meter walk test to >0.81 m/s as to improve gait speed for better community ambulation and to reduce fall risk.    Baseline 12/11/20: 14.6s = 0.68  m/s    Time 12    Period Weeks    Status New    Target Date 03/05/21                 Plan - 12/26/20 1856    Clinical Impression Statement Pt demonstrates excellent motivation during session today. She is able to complete all exercises as instructed today. Session focused on strengthening. Introduced squats and jumps on the Total Gym and AFOs were removed for these exercises. She continues to demonstrate typical impairment in static and dynamic positions requiring BUE support. Pt requires physical therapy interventions to maintain her functional status including LE strength, balance, and gait speed/quality and has not achieved maximal benefit from therapy services. She will benefit from continued PT services to address these deficits and maintain her function at home and school.    Personal Factors and Comorbidities Comorbidity 1;Past/Current Experience;Time since onset of injury/illness/exacerbation;Transportation;Social Background    Comorbidities spina bifida    Examination-Activity Limitations Bend;Carry;Continence;Lift;Reach Overhead;Squat;Stairs;Stand;Transfers    Examination-Participation Restrictions Church;Community Activity;Driving;Laundry;Meal Prep;Volunteer;Yard Work    Stability/Clinical Decision Making Stable/Uncomplicated    Rehab Potential Good    Clinical Impairments Affecting Rehab Potential weakness and decreased standing balance    PT Frequency Biweekly    PT Duration 12 weeks    PT Treatment/Interventions ADLs/Self Care Home Management;Aquatic Therapy;Biofeedback;Ultrasound;Moist  Heat;Iontophoresis 4mg /ml Dexamethasone;Electrical Stimulation;DME Instruction;Gait training;Stair training;Functional mobility training;Neuromuscular re-education;Balance training;Therapeutic exercise;Therapeutic activities;Patient/family education;Orthotic Fit/Training;Wheelchair mobility training;Manual techniques;Dry needling;Passive range of motion;Energy conservation;Splinting;Taping;Vestibular;Visual/perceptual remediation/compensation;Canalith Repostioning;Cryotherapy    PT Next Visit Plan Review HEP, balance and strength exercises    PT Home Exercise Plan Seated, supine, and core program: [id number]    Consulted and Agree with Plan of Care Patient           Patient will benefit from skilled therapeutic intervention in order to improve the following deficits and impairments:  Abnormal gait,Decreased mobility,Decreased balance,Decreased coordination,Difficulty walking,Decreased strength,Impaired tone,Postural dysfunction,Improper body mechanics  Visit Diagnosis: Unsteadiness on feet  Muscle weakness (generalized)     Problem List There are no problems to display for this patient.   Huprich,Jason 12/26/2020, 7:18 PM  [facility] [address] [address] [place], [place], [place] Phone: [phone]   Fax:  [phone]  Name: Michelle Randall MRN: Julaine Hua Date of Birth: 1996/02/03

## 2021-01-01 ENCOUNTER — Ambulatory Visit: Payer: Medicaid Other

## 2021-01-08 ENCOUNTER — Other Ambulatory Visit: Payer: Self-pay

## 2021-01-08 ENCOUNTER — Ambulatory Visit: Payer: Medicaid Other

## 2021-01-08 DIAGNOSIS — M6281 Muscle weakness (generalized): Secondary | ICD-10-CM

## 2021-01-08 DIAGNOSIS — R2681 Unsteadiness on feet: Secondary | ICD-10-CM

## 2021-01-09 NOTE — Therapy (Addendum)
[place] [facility] [address] [place], [place], [address] Phone: [phone]   Fax:  [phone]  Physical Therapy Treatment  Patient Details  Name: Michelle Randall MRN: [id number] Date of Birth: [birth year] Referring Provider (PT): Carren Rang PA   Encounter Date: 01/08/2021   PT End of Session - 01/09/21 1226    Visit Number 3    Number of Visits 7    Date for PT Re-Evaluation 03/05/21    Authorization Type eval: 12/11/20    Authorization Time Period 3 visits 12/26/19-01/22/20    Authorization - Visit Number 2    Authorization - Number of Visits 3    PT Start Time 1650    PT Stop Time 1730    PT Time Calculation (min) 40 min    Equipment Utilized During Treatment Gait belt;Other (comment)   Bilateral AFOs   Activity Tolerance Patient tolerated treatment well    Behavior During Therapy WFL for tasks assessed/performed           Past Medical History:  Diagnosis Date  . Neurogenic bladder   . Neurogenic bowel   . S/P VP shunt   . Spina bifida [facility])     Past Surgical History:  Procedure Laterality Date  . VENTRICULOPERITONEAL SHUNT      There were no vitals filed for this visit.   Subjective Assessment - 01/09/21 1226    Subjective Pt reports that she is doing well today. She denies any pain upon arrival. No specific questions or concerns currently.    Pertinent History Patient is a pleasant 25 year old female who presents for continuation of care for generalized weakness/ coordination deficits secondary to diagnosis of spina bifida. PMH includes VP shunt, spina bifida, and neurogenic bowel/bladder. Patient was discharged from this clinic at the end of 2021 due to meeting therapy cap for insurance that year. Patient reports that she has continued performing her HEP but notes a decline when she stops therapy due to inability to safely perform all of the balance and strength exercises as at home. One of the biggest challenge  she is experiencing is stair navigation  She reports having multiple near falls when negotiating stairs at home. Patient must ascend/descend stairs to reach bedroom upstairs increasing her risk for falls daily.    Limitations Walking;Standing;House hold activities;Other (comment)   Stairs   How long can you sit comfortably? n/a    How long can you stand comfortably? require use of bilateral lofstrand crutches    How long can you walk comfortably? dependent upon terrain    Patient Stated Goals Patient wants to be able to negotiate uneven terrain, cross thresholds in home environment, and negotiate steps in household    Currently in Pain? No/denies                TREATMENT   Ther-ex  Hooklying pilates 100's; Hooklying marching x 10 BLE; Hooklying SLR x 10 BLE; Supine straight leg hip abduction and adduction with light manual resistance x 10 each BLE; Hooklying bridges x 10; Hooklying manually resisted lumbothoracic rotation 5s hold x 5 each direction; Hooklying clams with manual resistance x 10; Hooklying adductor squeezes with manual resistance x 10; Standing mini squats without BUE support x 10; Lunges with light BUE support x 10 with each foot forward; 8" forward step ups with BUE support x 10 each LE;   Neuromuscular Re-education  WBOS cone pick-ups from floor without UE support x multiple bouts; WBOS eyes  open/closed x 30s each; WBOS eyes open horizontal and vertical head turns x 30s each; NBOS eyes open/closed x 30s each; NBOS eyes open horizontal and vertical head turns x 30s each;   Pt educated throughout session about proper posture and technique with exercises. Improved exercise technique, movement at target joints, use of target muscles after min to mod verbal, visual, tactile cues.    Pt demonstrates excellent motivation during session today. She is able to continue with supine/hooklying and standing exercises.Incorporated balance with standing exercises by  minimizing UE support. She continues to demonstrate typical impairment in static and dynamic positions requiring BUE support. She was able to perform balance exercises today as well in both narrow and wide stance. Pt requires physical therapy interventions to maintain her functional status including LE strength, balance, and gait speed/quality and has not achieved maximal benefit from therapy services. Shewill benefit fromcontinuedPT services to address these deficits and maintain herfunction at [employer].                                        PT Short Term Goals - 12/11/20 1502      PT SHORT TERM GOAL #1   Title Patient will report compliance with HEP for continued strengthening and stability during functional mobility.     Time 6    Period Weeks    Status New    Target Date 01/22/21             PT Long Term Goals - 12/11/20 1502      PT LONG TERM GOAL #1   Title Patient will increase their FOTO score to at least 59 to demonstrate increased function and mobility for higher quality of life.    Baseline 12/11/20: 51    Time 12    Period Weeks    Status New    Target Date 03/05/21      PT LONG TERM GOAL #2   Title Patient will maintain Berg Balance score of at least 32/56 in order to demonstrate decreased fall risk during functional activities.    Baseline 12/11/20: 32/56    Time 12    Period Weeks    Status New    Target Date 03/05/21      PT LONG TERM GOAL #3   Title Patient will be able to maintain safe performance with ascending/descending a flight of stairs using lofstrand crutches and railing in order to allow for safe navigation of her home environment.    Baseline 12/11/20: currently able to use lofstrands and/or railing with supervision by family    Time 12    Period Weeks    Status New    Target Date 03/05/21      PT LONG TERM GOAL #4   Title Patient will complete a TUG test in no more than 13.5 seconds for independent  mobility and decreased fall risk maintenance    Baseline 12/11/20: 13.4s    Time 12    Period Weeks    Status New    Target Date 03/05/21      PT LONG TERM GOAL #5   Title Patient will increase fastest 10 meter walk test to >0.81 m/s as to improve gait speed for better community ambulation and to reduce fall risk.    Baseline 12/11/20: 14.6s = 0.68 m/s    Time 12    Period Weeks    Status  New    Target Date 03/05/21                 Plan - 01/09/21 1226    Clinical Impression Statement Pt demonstrates excellent motivation during session today. She is able to continue with supine/hooklying and standing exercises. Incorporated balance with standing exercises by minimizing UE support. She continues to demonstrate typical impairment in static and dynamic positions requiring BUE support. She was able to perform balance exercises today as well in both narrow and wide stance. Pt requires physical therapy interventions to maintain her functional status including LE strength, balance, and gait speed/quality and has not achieved maximal benefit from therapy services. She will benefit from continued PT services to address these deficits and maintain her function at home and school.    Personal Factors and Comorbidities Comorbidity 1;Past/Current Experience;Time since onset of injury/illness/exacerbation;Transportation;Social Background    Comorbidities spina bifida    Examination-Activity Limitations Bend;Carry;Continence;Lift;Reach Overhead;Squat;Stairs;Stand;Transfers    Examination-Participation Restrictions Church;Community Activity;Driving;Laundry;Meal Prep;Volunteer;Yard Work    Stability/Clinical Decision Making Stable/Uncomplicated    Rehab Potential Good    Clinical Impairments Affecting Rehab Potential weakness and decreased standing balance    PT Frequency Biweekly    PT Duration 12 weeks    PT Treatment/Interventions ADLs/Self Care Home Management;Aquatic  Therapy;Biofeedback;Ultrasound;Moist Heat;Iontophoresis 4mg /ml Dexamethasone;Electrical Stimulation;DME Instruction;Gait training;Stair training;Functional mobility training;Neuromuscular re-education;Balance training;Therapeutic exercise;Therapeutic activities;Patient/family education;Orthotic Fit/Training;Wheelchair mobility training;Manual techniques;Dry needling;Passive range of motion;Energy conservation;Splinting;Taping;Vestibular;Visual/perceptual remediation/compensation;Canalith Repostioning;Cryotherapy    PT Next Visit Plan Review HEP, balance and strength exercises    PT Home Exercise Plan Seated, supine, and core program: [id number]    Consulted and Agree with Plan of Care Patient           Patient will benefit from skilled therapeutic intervention in order to improve the following deficits and impairments:  Abnormal gait,Decreased mobility,Decreased balance,Decreased coordination,Difficulty walking,Decreased strength,Impaired tone,Postural dysfunction,Improper body mechanics  Visit Diagnosis: Unsteadiness on feet  Muscle weakness (generalized)     Problem List There are no problems to display for this patient.  PT, DPT, GCS  Henlee Donovan 01/09/2021, 8:55 PM  [facility] [address] [place], [place], [place] Phone: [phone]   Fax:  [phone]  Name: KALEENA CORROW MRN: Julaine Hua Date of Birth: [birth year]

## 2021-01-15 ENCOUNTER — Ambulatory Visit: Payer: Medicaid Other

## 2021-01-22 ENCOUNTER — Other Ambulatory Visit: Payer: Self-pay

## 2021-01-22 ENCOUNTER — Ambulatory Visit: Payer: Medicaid Other | Attending: Student

## 2021-01-22 DIAGNOSIS — R2681 Unsteadiness on feet: Secondary | ICD-10-CM | POA: Diagnosis present

## 2021-01-22 DIAGNOSIS — M6281 Muscle weakness (generalized): Secondary | ICD-10-CM | POA: Insufficient documentation

## 2021-01-22 NOTE — Therapy (Signed)
[facility] [address]. [place], [place], [address] Phone: [phone]   Fax:  [phone]  Physical Therapy Treatment  Patient Details  Name: Michelle Randall MRN: [id number] Date of Birth: 02/09/1996 Referring Provider (PT): Carren Rang PA   Encounter Date: 01/22/2021   PT End of Session - 01/22/21 2034    Visit Number 4    Number of Visits 7    Date for PT Re-Evaluation 03/05/21    Authorization Type eval: 12/11/20    Authorization Time Period 3 visits 12/26/19-01/22/20    Authorization - Visit Number 3    Authorization - Number of Visits 3    PT Start Time 1650    PT Stop Time 1735    PT Time Calculation (min) 45 min    Equipment Utilized During Treatment Gait belt;Other (comment)   Bilateral AFOs   Activity Tolerance Patient tolerated treatment well    Behavior During Therapy WFL for tasks assessed/performed           Past Medical History:  Diagnosis Date  . Neurogenic bladder   . Neurogenic bowel   . S/P VP shunt   . Spina bifida [facility])     Past Surgical History:  Procedure Laterality Date  . VENTRICULOPERITONEAL SHUNT      There were no vitals filed for this visit.   Subjective Assessment - 01/22/21 1650    Subjective Pt reports that she is doing well today. She denies any pain upon arrival. No specific questions or concerns currently.    Pertinent History Patient is a pleasant 25 year old female who presents for continuation of care for generalized weakness/ coordination deficits secondary to diagnosis of spina bifida. PMH includes VP shunt, spina bifida, and neurogenic bowel/bladder. Patient was discharged from this clinic at the end of 2021 due to meeting therapy cap for insurance that year. Patient reports that she has continued performing her HEP but notes a decline when she stops therapy due to inability to safely perform all of the balance and strength exercises as at home. One of the biggest challenge  she is experiencing is stair navigation  She reports having multiple near falls when negotiating stairs at home. Patient must ascend/descend stairs to reach bedroom upstairs increasing her risk for falls daily.    Limitations Walking;Standing;House hold activities;Other (comment)   Stairs   How long can you sit comfortably? n/a    How long can you stand comfortably? require use of bilateral lofstrand crutches    How long can you walk comfortably? dependent upon terrain    Patient Stated Goals Patient wants to be able to negotiate uneven terrain, cross thresholds in home environment, and negotiate steps in household    Currently in Pain? No/denies              TREATMENT   Ther-ex  NuStep L2 x 5 minutes BUE/BLE during history (4 minutes unbilled); Total Gym L11 BLE squats 2 x 10 (AFOs removed); Total Gym L11 single leg squats x 10 BLE (AFOs removed); Total Gym L11 double leg jumps 2 x 10 BLE (AFOs removed); Attempted heel raises on L4 however pt unable to activate plantarflexors; Standing mini squats without BUE support x 10 (AFOs removed) Lunges with light BUE support x 10 with each foot forward (AFOs removed) 8" forward step ups with BUE support x 10 each LE (with AFOs);   Neuromuscular Re-education  Forward and retro gait in // bars x 2 lengths each (AFOs  removed); Side stepping in // bars x 2 lengths each (AFOs removed); WBOS eyes open x 30s (AFOs removed); NBOS eyes/closed x 30s each (AFOs donned); Semitandem balance alternating forward LE x 30s each (AFOs donned);   Pt educated throughout session about proper posture and technique with exercises. Improved exercise technique, movement at target joints, use of target muscles after min to mod verbal, visual, tactile cues.    Pt demonstrates excellent motivation during session today. Repeated Total Gym single and double leg squats as well as jumps today but at a more difficult level. Removed AFOs for the majority of session  today to challenge ankle and knee stability. She continues to demonstrate typical impairment in static and dynamic positions requiring BUE support. She was able to perform balance exercises today as well in both narrow and wide stance. Notable improvement in balance with AFOs donned. Will continue to progress balance and LE strengthening in future sessions as well as focus on steps which is currently the biggest functional challenge for patient. Pt requires physical therapy interventions to maintain her functional status including LE strength, balance, and gait speed/quality and has not achieved maximal benefit from therapy services. Shewill benefit fromcontinuedPT services to address these deficits and maintain herfunction at [employer].                            PT Short Term Goals - 12/11/20 1502      PT SHORT TERM GOAL #1   Title Patient will report compliance with HEP for continued strengthening and stability during functional mobility.     Time 6    Period Weeks    Status New    Target Date 01/22/21             PT Long Term Goals - 12/11/20 1502      PT LONG TERM GOAL #1   Title Patient will increase their FOTO score to at least 59 to demonstrate increased function and mobility for higher quality of life.    Baseline 12/11/20: 51    Time 12    Period Weeks    Status New    Target Date 03/05/21      PT LONG TERM GOAL #2   Title Patient will maintain Berg Balance score of at least 32/56 in order to demonstrate decreased fall risk during functional activities.    Baseline 12/11/20: 32/56    Time 12    Period Weeks    Status New    Target Date 03/05/21      PT LONG TERM GOAL #3   Title Patient will be able to maintain safe performance with ascending/descending a flight of stairs using lofstrand crutches and railing in order to allow for safe navigation of her home environment.    Baseline 12/11/20: currently able to use lofstrands and/or railing  with supervision by family    Time 12    Period Weeks    Status New    Target Date 03/05/21      PT LONG TERM GOAL #4   Title Patient will complete a TUG test in no more than 13.5 seconds for independent mobility and decreased fall risk maintenance    Baseline 12/11/20: 13.4s    Time 12    Period Weeks    Status New    Target Date 03/05/21      PT LONG TERM GOAL #5   Title Patient will increase fastest 10 meter walk test  to >0.81 m/s as to improve gait speed for better community ambulation and to reduce fall risk.    Baseline 12/11/20: 14.6s = 0.68 m/s    Time 12    Period Weeks    Status New    Target Date 03/05/21                 Plan - 01/22/21 2034    Clinical Impression Statement Pt demonstrates excellent motivation during session today. Repeated Total Gym single and double leg squats as well as jumps today but at a more difficult level. Removed AFOs for the majority of session today to challenge ankle and knee stability. She continues to demonstrate typical impairment in static and dynamic positions requiring BUE support. She was able to perform balance exercises today as well in both narrow and wide stance. Notable improvement in balance with AFOs donned. Will continue to progress balance and LE strengthening in future sessions as well as focus on steps which is currently the biggest functional challenge for patient. Pt requires physical therapy interventions to maintain her functional status including LE strength, balance, and gait speed/quality and has not achieved maximal benefit from therapy services. She will benefit from continued PT services to address these deficits and maintain her function at home and school.    Personal Factors and Comorbidities Comorbidity 1;Past/Current Experience;Time since onset of injury/illness/exacerbation;Transportation;Social Background    Comorbidities spina bifida    Examination-Activity Limitations Bend;Carry;Continence;Lift;Reach  Overhead;Squat;Stairs;Stand;Transfers    Examination-Participation Restrictions Church;Community Activity;Driving;Laundry;Meal Prep;Volunteer;Yard Work    Stability/Clinical Decision Making Stable/Uncomplicated    Rehab Potential Good    Clinical Impairments Affecting Rehab Potential weakness and decreased standing balance    PT Frequency Biweekly    PT Duration 12 weeks    PT Treatment/Interventions ADLs/Self Care Home Management;Aquatic Therapy;Biofeedback;Ultrasound;Moist Heat;Iontophoresis 4mg /ml Dexamethasone;Electrical Stimulation;DME Instruction;Gait training;Stair training;Functional mobility training;Neuromuscular re-education;Balance training;Therapeutic exercise;Therapeutic activities;Patient/family education;Orthotic Fit/Training;Wheelchair mobility training;Manual techniques;Dry needling;Passive range of motion;Energy conservation;Splinting;Taping;Vestibular;Visual/perceptual remediation/compensation;Canalith Repostioning;Cryotherapy    PT Next Visit Plan Review HEP, balance and strength exercises    PT Home Exercise Plan Seated, supine, and core program: [id number]    Consulted and Agree with Plan of Care Patient           Patient will benefit from skilled therapeutic intervention in order to improve the following deficits and impairments:  Abnormal gait,Decreased mobility,Decreased balance,Decreased coordination,Difficulty walking,Decreased strength,Impaired tone,Postural dysfunction,Improper body mechanics  Visit Diagnosis: Unsteadiness on feet  Muscle weakness (generalized)     Problem List There are no problems to display for this patient.  PT, DPT, GCS  Michelle Randall 01/22/2021, 8:49 PM  [facility] [address] [place], [place], [place] Phone: [phone]   Fax:  [phone]  Name: Michelle Randall MRN: Julaine Hua Date of Birth: August 02, 1996

## 2021-01-29 ENCOUNTER — Ambulatory Visit: Payer: Medicaid Other

## 2021-02-04 NOTE — Patient Instructions (Incomplete)
TREATMENT   Ther-ex  NuStep L2 x 5 minutes BUE/BLE during history (4 minutes unbilled); Total Gym L11 BLE squats 2 x 10 (AFOs removed); Total Gym L11 single leg squats x 10 BLE (AFOs removed); Total Gym L11 double leg jumps 2 x 10 BLE (AFOs removed); Attempted heel raises on L4 however pt unable to activate plantarflexors; Standing mini squats without BUE support x 10 (AFOs removed); Lunges with light BUE support x 10 with each foot forward (AFOs removed); 8" forward step ups with BUE support x 10 eachLE (with AFOs);   Neuromuscular Re-education  Forward and retro gait in // bars x 2 lengths each (AFOs removed); Side stepping in // bars x 2 lengths each (AFOs removed); WBOS eyes open x 30s (AFOs removed); NBOS eyes/closed x 30s each (AFOs donned); Semitandem balance alternating forward LE x 30s each (AFOs donned);   Pt educated throughout session about proper posture and technique with exercises. Improved exercise technique, movement at target joints, use of target muscles after min to mod verbal, visual, tactile cues.    Pt demonstrates excellent motivation during session today. Repeated Total Gym single and double leg squats as well as jumps today but at a more difficult level. Removed AFOs for the majority of session today to challenge ankle and knee stability. She continues to demonstrate typical impairment in static and dynamic positions requiring BUE support. She was able to perform balance exercises today as well in both narrow and wide stance. Notable improvement in balance with AFOs donned. Will continue to progress balance and LE strengthening in future sessions as well as focus on steps which is currently the biggest functional challenge for patient. Pt requires physical therapy interventions to maintain her functional status including LE strength, balance, and gait speed/quality and has not achieved maximal benefit from therapy services. Shewill benefit  fromcontinuedPT services to address these deficits and maintain herfunction at [employer].

## 2021-02-05 ENCOUNTER — Ambulatory Visit: Payer: Medicaid Other

## 2021-02-05 ENCOUNTER — Other Ambulatory Visit: Payer: Self-pay

## 2021-02-05 DIAGNOSIS — R2681 Unsteadiness on feet: Secondary | ICD-10-CM

## 2021-02-05 DIAGNOSIS — M6281 Muscle weakness (generalized): Secondary | ICD-10-CM

## 2021-02-05 NOTE — Therapy (Signed)
[facility] [address] [place], [place], [address] Phone: [phone]   Fax:  [phone]  Physical Therapy Treatment  Patient Details  Name: Michelle Randall MRN: [id number] Date of Birth: January 22, 1996 Referring Provider (PT): Carren Rang PA   Encounter Date: 02/05/2021   PT End of Session - 02/06/21 1217    Visit Number 5    Number of Visits 7    Date for PT Re-Evaluation 03/05/21    Authorization Type eval: 12/11/20    Authorization Time Period 12 visits 01/22/21-07/08/21    Authorization - Visit Number 2    Authorization - Number of Visits 12    PT Start Time 1640    PT Stop Time 1730    PT Time Calculation (min) 50 min    Equipment Utilized During Treatment Gait belt;Other (comment)   Bilateral AFOs   Activity Tolerance Patient tolerated treatment well    Behavior During Therapy WFL for tasks assessed/performed           Past Medical History:  Diagnosis Date  . Neurogenic bladder   . Neurogenic bowel   . S/P VP shunt   . Spina bifida [facility])     Past Surgical History:  Procedure Laterality Date  . VENTRICULOPERITONEAL SHUNT      There were no vitals filed for this visit.   Subjective Assessment - 02/05/21 1654    Subjective Pt reports that she is doing well today. She denies any pain upon arrival.  No falls since last therapy session and no updates.  No specific questions or concerns currently.    Pertinent History Patient is a pleasant 25 year old female who presents for continuation of care for generalized weakness/ coordination deficits secondary to diagnosis of spina bifida. PMH includes VP shunt, spina bifida, and neurogenic bowel/bladder. Patient was discharged from this clinic at the end of 2021 due to meeting therapy cap for insurance that year. Patient reports that she has continued performing her HEP but notes a decline when she stops therapy due to inability to safely perform all of the balance and  strength exercises as at home. One of the biggest challenge she is experiencing is stair navigation  She reports having multiple near falls when negotiating stairs at home. Patient must ascend/descend stairs to reach bedroom upstairs increasing her risk for falls daily.    Limitations Walking;Standing;House hold activities;Other (comment)   Stairs   How long can you sit comfortably? n/a    How long can you stand comfortably? require use of bilateral lofstrand crutches    How long can you walk comfortably? dependent upon terrain    Patient Stated Goals Patient wants to be able to negotiate uneven terrain, cross thresholds in home environment, and negotiate steps in household    Currently in Pain? No/denies               TREATMENT   Ther-ex  NuStep L2 x 5 minutes BUE/BLE during history (unbilled); Total Gym L14 BLE squats 2 x 10 (AFOs removed); Total Gym L14 single leg squats x 10 BLE (AFOs removed); Total Gym L14 double leg jumps 2 x 10 BLE (AFOs removed); Lunges with light BUE support x 10 with each foot forward (AFOs removed); Sit to stand without UE support x 10 (AFOs donned) Seated LAQ with manual resistance from therapist x 10 BLE; Seated HS curls with manual resistance from therapist x 10 BLE; Seated marches with manual resistance from therapist x 10 BLE;  Seated clams with manual resistance from therapist x 10 BLE; Seated adductor squeeze with manual resistance from therapist x 10 BLE;   Neuromuscular Re-education  Forward and retro gait in // bars x 2 lengths each (AFOs donned); Side stepping in // bars x 2 lengths each (AFOs donned); WBOS eyes open/closed x 30s each (AFOs removed); NBOS eyes open/closed x 30s each (AFOs removed); Semitandem balance alternating forward LE x 30s each (AFOs removed); Airex WBOS eyes open, cues for knee separation and improved activation of gluts/external rotators x 60s;   Pt educated throughout session about proper posture and  technique with exercises. Improved exercise technique, movement at target joints, use of target muscles after min to mod verbal, visual, tactile cues.    Pt demonstrates excellent motivation during session today. Repeated Total Gym single and double leg squats as well as jumps today but once again at a more difficult level. Removed AFOs for the majority of session today to challenge ankle and knee stability. She continues to demonstrate typical impairment in static and dynamic positions requiring BUE support. She was able to perform balance exercises today as well in both narrow and wide stance.  Added some Airex pad balance to challenge patient on an unstable surface. Will continue to progress balance and LE strengthening in future sessions as well as focus on steps which is currently the biggest functional challenge for patient. Pt requires physical therapy interventions to maintain her functional status including LE strength, balance, and gait speed/quality and has not achieved maximal benefit from therapy services. Shewill benefit fromcontinuedPT services to address these deficits and maintain herfunction at [employer].                          PT Short Term Goals - 12/11/20 1502      PT SHORT TERM GOAL #1   Title Patient will report compliance with HEP for continued strengthening and stability during functional mobility.     Time 6    Period Weeks    Status New    Target Date 01/22/21             PT Long Term Goals - 12/11/20 1502      PT LONG TERM GOAL #1   Title Patient will increase their FOTO score to at least 59 to demonstrate increased function and mobility for higher quality of life.    Baseline 12/11/20: 51    Time 12    Period Weeks    Status New    Target Date 03/05/21      PT LONG TERM GOAL #2   Title Patient will maintain Berg Balance score of at least 32/56 in order to demonstrate decreased fall risk during functional activities.     Baseline 12/11/20: 32/56    Time 12    Period Weeks    Status New    Target Date 03/05/21      PT LONG TERM GOAL #3   Title Patient will be able to maintain safe performance with ascending/descending a flight of stairs using lofstrand crutches and railing in order to allow for safe navigation of her home environment.    Baseline 12/11/20: currently able to use lofstrands and/or railing with supervision by family    Time 12    Period Weeks    Status New    Target Date 03/05/21      PT LONG TERM GOAL #4   Title Patient will complete a TUG test  in no more than 13.5 seconds for independent mobility and decreased fall risk maintenance    Baseline 12/11/20: 13.4s    Time 12    Period Weeks    Status New    Target Date 03/05/21      PT LONG TERM GOAL #5   Title Patient will increase fastest 10 meter walk test to >0.81 m/s as to improve gait speed for better community ambulation and to reduce fall risk.    Baseline 12/11/20: 14.6s = 0.68 m/s    Time 12    Period Weeks    Status New    Target Date 03/05/21                 Plan - 02/06/21 1219    Clinical Impression Statement Pt demonstrates excellent motivation during session today. Repeated Total Gym single and double leg squats as well as jumps today but once again at a more difficult level. Removed AFOs for the majority of session today to challenge ankle and knee stability. She continues to demonstrate typical impairment in static and dynamic positions requiring BUE support. She was able to perform balance exercises today as well in both narrow and wide stance.  Added some Airex pad balance to challenge patient on an unstable surface. Will continue to progress balance and LE strengthening in future sessions as well as focus on steps which is currently the biggest functional challenge for patient. Pt requires physical therapy interventions to maintain her functional status including LE strength, balance, and gait speed/quality and has  not achieved maximal benefit from therapy services. She will benefit from continued PT services to address these deficits and maintain her function at home and school.    Personal Factors and Comorbidities Comorbidity 1;Past/Current Experience;Time since onset of injury/illness/exacerbation;Transportation;Social Background    Comorbidities spina bifida    Examination-Activity Limitations Bend;Carry;Continence;Lift;Reach Overhead;Squat;Stairs;Stand;Transfers    Examination-Participation Restrictions Church;Community Activity;Driving;Laundry;Meal Prep;Volunteer;Yard Work    Stability/Clinical Decision Making Stable/Uncomplicated    Rehab Potential Good    Clinical Impairments Affecting Rehab Potential weakness and decreased standing balance    PT Frequency Biweekly    PT Duration 12 weeks    PT Treatment/Interventions ADLs/Self Care Home Management;Aquatic Therapy;Biofeedback;Ultrasound;Moist Heat;Iontophoresis 4mg /ml Dexamethasone;Electrical Stimulation;DME Instruction;Gait training;Stair training;Functional mobility training;Neuromuscular re-education;Balance training;Therapeutic exercise;Therapeutic activities;Patient/family education;Orthotic Fit/Training;Wheelchair mobility training;Manual techniques;Dry needling;Passive range of motion;Energy conservation;Splinting;Taping;Vestibular;Visual/perceptual remediation/compensation;Canalith Repostioning;Cryotherapy    PT Next Visit Plan Review HEP, balance and strength exercises    PT Home Exercise Plan Seated, supine, and core program: [id number]    Consulted and Agree with Plan of Care Patient           Patient will benefit from skilled therapeutic intervention in order to improve the following deficits and impairments:  Abnormal gait,Decreased mobility,Decreased balance,Decreased coordination,Difficulty walking,Decreased strength,Impaired tone,Postural dysfunction,Improper body mechanics  Visit Diagnosis: Unsteadiness on feet  Muscle weakness  (generalized)     Problem List There are no problems to display for this patient.  Avin Gibbons PT, DPT, GCS  Winni Ehrhard 02/06/2021, 12:26 PM  [place] [facility] [address] [place], [place], [place] Phone: [phone]   Fax:  [phone]  Name: SUMMIT BORCHARDT MRN: Julaine Hua Date of Birth: 01-15-1996

## 2021-02-19 ENCOUNTER — Other Ambulatory Visit: Payer: Self-pay

## 2021-02-19 ENCOUNTER — Ambulatory Visit: Payer: Medicaid Other | Attending: Student

## 2021-02-19 DIAGNOSIS — R2681 Unsteadiness on feet: Secondary | ICD-10-CM | POA: Diagnosis not present

## 2021-02-19 DIAGNOSIS — M6281 Muscle weakness (generalized): Secondary | ICD-10-CM | POA: Insufficient documentation

## 2021-02-19 NOTE — Therapy (Signed)
[place] [facility] [address] [place], [place], [address] Phone: [phone]   Fax:  [phone]  Physical Therapy Treatment  Patient Details  Name: Michelle Randall MRN: [id number] Date of Birth: 07/26/1996 Referring Provider (PT): Carren Rang PA   Encounter Date: 02/19/2021   PT End of Session - 02/19/21 1706    Visit Number 6    Number of Visits 7    Date for PT Re-Evaluation 03/05/21    Authorization Type eval: 12/11/20    Authorization Time Period 12 visits 01/22/21-07/08/21    Authorization - Visit Number 2    Authorization - Number of Visits 12    PT Start Time 1700    PT Stop Time 1745    PT Time Calculation (min) 45 min    Equipment Utilized During Treatment Gait belt;Other (comment)   Bilateral AFOs   Activity Tolerance Patient tolerated treatment well    Behavior During Therapy WFL for tasks assessed/performed           Past Medical History:  Diagnosis Date  . Neurogenic bladder   . Neurogenic bowel   . S/P VP shunt   . Spina bifida [facility])     Past Surgical History:  Procedure Laterality Date  . VENTRICULOPERITONEAL SHUNT      There were no vitals filed for this visit.   Subjective Assessment - 02/19/21 1705    Subjective Pt reports that she is doing well today. She denies any pain upon arrival. She was diagnosed with a UTI since her last therapy session and finished her last antibiotic this morning. No falls since last therapy session and no updates. She has not been fasting for Ramadan due to the UTI. No specific questions or concerns currently.    Pertinent History Patient is a pleasant 25 year old female who presents for continuation of care for generalized weakness/ coordination deficits secondary to diagnosis of spina bifida. PMH includes VP shunt, spina bifida, and neurogenic bowel/bladder. Patient was discharged from this clinic at the end of 2021 due to meeting therapy cap for insurance that year. Patient  reports that she has continued performing her HEP but notes a decline when she stops therapy due to inability to safely perform all of the balance and strength exercises as at home. One of the biggest challenge she is experiencing is stair navigation  She reports having multiple near falls when negotiating stairs at home. Patient must ascend/descend stairs to reach bedroom upstairs increasing her risk for falls daily.    Limitations Walking;Standing;House hold activities;Other (comment)   Stairs   How long can you sit comfortably? n/a    How long can you stand comfortably? require use of bilateral lofstrand crutches    How long can you walk comfortably? dependent upon terrain    Patient Stated Goals Patient wants to be able to negotiate uneven terrain, cross thresholds in home environment, and negotiate steps in household    Currently in Pain? No/denies              TREATMENT   Ther-ex NuStep L2 x 2 minutes BUE/BLE during history for warm-up (3 minutes unbilled); Total Gym L14BLE squatsx 10, verbal and tactile cues from therapist to prevent BLE valgus  (AFOs removed); Total Gym L14single leg squats x 10 BLE (AFOs removed); Total Gym L14double leg jumps 2 x 10 BLE (AFOs removed); Total Gym L16single leg squats x 10 BLE (AFOs removed); Lunges with light BUE support x 10 with each  foot forward(AFOs removed); Seated LAQ with 3# ankle weights x 1 minute; 6" alternating LE step-ups with BUE support and 3# ankle weights x 10 BLE;   Neuromuscular Re-education Forward and retro gait in // bars x 2 lengths each (AFOs donned); Side stepping in // bars x 2 lengths each (AFOs donned); WBOS eyes open/closed x 30s each (AFOs removed); NBOS eyes open/closed x 30s each (AFOs removed); Semitandem balance alternating forward LE x 30s each (AFOs removed);   Pt educated throughout session about proper posture and technique with exercises. Improved exercise technique, movement at target  joints, use of target muscles after min to mod verbal, visual, tactile cues.    Pt demonstrates excellent motivation during session today.Repeated Total Gym single and double leg squats as well as jumps today but once again at a more difficult level. Removed AFOs for the majority of session today to challenge ankle and knee stability.She continues to demonstrate typical impairment in static and dynamic positions requiring BUE support. She was able to perform balance exercises today as well in both narrow and wide stance.  Continued to work on quad, glut, and hamstring strengthening as steps are her most challenging functional activity at home. Maintained ankle weights for step-ups to challenge hip flexor strength. Will continue to progress balance and LE strengthening in future sessions as well as focus on steps which is currently the biggest functional challenge for patient.Pt requires physical therapy interventions to maintain her functional status including LE strength, balance, and gait speed/quality and has not achieved maximal benefit from therapy services. Shewill benefit fromcontinuedPT services to address these deficits and maintain herfunction at [employer].                           PT Short Term Goals - 12/11/20 1502      PT SHORT TERM GOAL #1   Title Patient will report compliance with HEP for continued strengthening and stability during functional mobility.     Time 6    Period Weeks    Status New    Target Date 01/22/21             PT Long Term Goals - 12/11/20 1502      PT LONG TERM GOAL #1   Title Patient will increase their FOTO score to at least 59 to demonstrate increased function and mobility for higher quality of life.    Baseline 12/11/20: 51    Time 12    Period Weeks    Status New    Target Date 03/05/21      PT LONG TERM GOAL #2   Title Patient will maintain Berg Balance score of at least 32/56 in order to demonstrate  decreased fall risk during functional activities.    Baseline 12/11/20: 32/56    Time 12    Period Weeks    Status New    Target Date 03/05/21      PT LONG TERM GOAL #3   Title Patient will be able to maintain safe performance with ascending/descending a flight of stairs using lofstrand crutches and railing in order to allow for safe navigation of her home environment.    Baseline 12/11/20: currently able to use lofstrands and/or railing with supervision by family    Time 12    Period Weeks    Status New    Target Date 03/05/21      PT LONG TERM GOAL #4   Title Patient will complete  a TUG test in no more than 13.5 seconds for independent mobility and decreased fall risk maintenance    Baseline 12/11/20: 13.4s    Time 12    Period Weeks    Status New    Target Date 03/05/21      PT LONG TERM GOAL #5   Title Patient will increase fastest 10 meter walk test to >0.81 m/s as to improve gait speed for better community ambulation and to reduce fall risk.    Baseline 12/11/20: 14.6s = 0.68 m/s    Time 12    Period Weeks    Status New    Target Date 03/05/21                 Plan - 02/19/21 1706    Clinical Impression Statement Pt demonstrates excellent motivation during session today. Repeated Total Gym single and double leg squats as well as jumps today but once again at a more difficult level. Removed AFOs for the majority of session today to challenge ankle and knee stability. She continues to demonstrate typical impairment in static and dynamic positions requiring BUE support. She was able to perform balance exercises today as well in both narrow and wide stance.  Continued to work on quad, glut, and hamstring strengthening as steps are her most challenging functional activity at home. Maintained ankle weights for step-ups to challenge hip flexor strength. Will continue to progress balance and LE strengthening in future sessions as well as focus on steps which is currently the biggest  functional challenge for patient. Pt requires physical therapy interventions to maintain her functional status including LE strength, balance, and gait speed/quality and has not achieved maximal benefit from therapy services. She will benefit from continued PT services to address these deficits and maintain her function at home and school.    Personal Factors and Comorbidities Comorbidity 1;Past/Current Experience;Time since onset of injury/illness/exacerbation;Transportation;Social Background    Comorbidities spina bifida    Examination-Activity Limitations Bend;Carry;Continence;Lift;Reach Overhead;Squat;Stairs;Stand;Transfers    Examination-Participation Restrictions Church;Community Activity;Driving;Laundry;Meal Prep;Volunteer;Yard Work    Stability/Clinical Decision Making Stable/Uncomplicated    Rehab Potential Good    Clinical Impairments Affecting Rehab Potential weakness and decreased standing balance    PT Frequency Biweekly    PT Duration 12 weeks    PT Treatment/Interventions ADLs/Self Care Home Management;Aquatic Therapy;Biofeedback;Ultrasound;Moist Heat;Iontophoresis 4mg /ml Dexamethasone;Electrical Stimulation;DME Instruction;Gait training;Stair training;Functional mobility training;Neuromuscular re-education;Balance training;Therapeutic exercise;Therapeutic activities;Patient/family education;Orthotic Fit/Training;Wheelchair mobility training;Manual techniques;Dry needling;Passive range of motion;Energy conservation;Splinting;Taping;Vestibular;Visual/perceptual remediation/compensation;Canalith Repostioning;Cryotherapy    PT Next Visit Plan Review HEP, balance and strength exercises    PT Home Exercise Plan Seated, supine, and core program: [id number]    Consulted and Agree with Plan of Care Patient           Patient will benefit from skilled therapeutic intervention in order to improve the following deficits and impairments:  Abnormal gait,Decreased mobility,Decreased  balance,Decreased coordination,Difficulty walking,Decreased strength,Impaired tone,Postural dysfunction,Improper body mechanics  Visit Diagnosis: Unsteadiness on feet  Muscle weakness (generalized)     Problem List There are no problems to display for this patient.  Rether Rison PT, DPT, GCS  Michelle Randall 02/20/2021, 10:50 AM  [place] [facility] [address] [place], [place], [place] Phone: [phone]   Fax:  [phone]  Name: Michelle Randall MRN: Julaine Hua Date of Birth: 07-25-1996

## 2021-03-02 ENCOUNTER — Ambulatory Visit: Payer: Medicaid Other

## 2021-03-02 ENCOUNTER — Other Ambulatory Visit: Payer: Self-pay

## 2021-03-02 DIAGNOSIS — M6281 Muscle weakness (generalized): Secondary | ICD-10-CM

## 2021-03-02 DIAGNOSIS — R2681 Unsteadiness on feet: Secondary | ICD-10-CM | POA: Diagnosis not present

## 2021-03-02 NOTE — Therapy (Signed)
[place] [facility] [address] [place], [place], [address] Phone: [phone]   Fax:  [phone]  Physical Therapy Treatment/Recertification  Patient Details  Name: Michelle Randall MRN: [id number] Date of Birth: [birth year] Referring Provider (PT): Carren Rang PA   Encounter Date: 03/02/2021   PT End of Session - 03/02/21 1448    Visit Number 7    Number of Visits 13    Date for PT Re-Evaluation 05/25/21    Authorization Type eval: 12/11/20, recert: 03/02/21    Authorization Time Period 12 visits 01/22/21-07/08/21    Authorization - Visit Number 4    Authorization - Number of Visits 12    PT Start Time 1445    PT Stop Time 1530    PT Time Calculation (min) 45 min    Equipment Utilized During Treatment Gait belt;Other (comment)   Bilateral AFOs   Activity Tolerance Patient tolerated treatment well    Behavior During Therapy WFL for tasks assessed/performed           Past Medical History:  Diagnosis Date  . Neurogenic bladder   . Neurogenic bowel   . S/P VP shunt   . Spina bifida [facility])     Past Surgical History:  Procedure Laterality Date  . VENTRICULOPERITONEAL SHUNT      There were no vitals filed for this visit.   Subjective Assessment - 03/02/21 1447    Subjective Pt reports that she is doing well today. She denies any pain upon arrival. No falls since last therapy session and no updates. She has been fasting for Ramadan. New stair lift gets delivered and installed this Friday. No specific questions or concerns currently.    Pertinent History Patient is a pleasant 25 year old female who presents for continuation of care for generalized weakness/ coordination deficits secondary to diagnosis of spina bifida. PMH includes VP shunt, spina bifida, and neurogenic bowel/bladder. Patient was discharged from this clinic at the end of 2021 due to meeting therapy cap for insurance that year. Patient reports that she has continued  performing her HEP but notes a decline when she stops therapy due to inability to safely perform all of the balance and strength exercises as at home. One of the biggest challenge she is experiencing is stair navigation  She reports having multiple near falls when negotiating stairs at home. Patient must ascend/descend stairs to reach bedroom upstairs increasing her risk for falls daily.    Limitations Walking;Standing;House hold activities;Other (comment)   Stairs   How long can you sit comfortably? n/a    How long can you stand comfortably? require use of bilateral lofstrand crutches    How long can you walk comfortably? dependent upon terrain    Patient Stated Goals Patient wants to be able to negotiate uneven terrain, cross thresholds in home environment, and negotiate steps in household    Currently in Pain? No/denies              [facility] PT Assessment - 03/02/21 1504      Standardized Balance Assessment   Standardized Balance Assessment Berg Balance Test      Berg Balance Test   Sit to Stand Able to stand without using hands and stabilize independently    Standing Unsupported Able to stand safely 2 minutes    Sitting with Back Unsupported but Feet Supported on Floor or Stool Able to sit safely and securely 2 minutes    Stand to Sit Controls descent by using  hands    Transfers Able to transfer safely, definite need of hands    Standing Unsupported with Eyes Closed Able to stand 10 seconds with supervision    Standing Unsupported with Feet Together Needs help to attain position but able to stand for 30 seconds with feet together    From Standing, Reach Forward with Outstretched Arm Can reach forward >5 cm safely (2")    From Standing Position, Pick up Object from Floor Able to pick up shoe, needs supervision    From Standing Position, Turn to Look Behind Over each Shoulder Needs supervision when turning    Turn 360 Degrees Needs assistance while turning    Standing Unsupported,  Alternately Place Feet on Step/Stool Able to complete >2 steps/needs minimal assist    Standing Unsupported, One Foot in Front Able to take small step independently and hold 30 seconds    Standing on One Leg Tries to lift leg/unable to hold 3 seconds but remains standing independently    Total Score 32            TREATMENT   Neuromuscular Re-education  Updated outcome measures and goals with patient today:   OUTCOME MEASURES:  TEST 12/11/20 03/02/21 Interpretation  5 times sit<>stand 10.6s 9.6s WNL, however pt has one LOB requiring therapist assist to prevent fall  10 meter walk test 14.6s = 0.68 m/s 14.3s = 0.70 m/s <1.0 m/s indicates increased risk for falls; limited community ambulator  Timed up and Go 13.4s 13.5s WNL, use of lofstrand crutches, >14 sec indicates increased risk for falls  6 minute walk test Deferred Deferred 1000 feet is community ambulator  BERG Balance Assessment 32/56 32/56 <36/56 (100% risk for falls), 37-45 (80% risk for falls); 46-51 (>50% risk for falls); 52-55 (lower risk <25% of falls)  FOTO 51 49 Goal is to reach 53%   NBOS eyesopen/closed x 30s each (AFOsdonned); NBOS eyesopen with horizontal and vertical head turns x 30s each (AFOsdonned);   Ther-ex  Total Gym L16BLE squatsx 10, (AFOs donned); Total Gym L16double leg jumps 2 x 10 BLE (AFOs donned); Total Gym L16single leg squats 2 x 10 BLE (AFOs donned); 6" alternating LE step-ups with BUE support on hand rails (rails not optimal height but intentionally designed to practice community steps)   Pt educated throughout session about proper posture and technique with exercises. Improved exercise technique, movement at target joints, use of target muscles after min to mod verbal, visual, tactile cues.    Pt demonstrates excellent motivation during session today. Updated outcome measures and goals during visit today. Her TUG, BERG, 3558m gait speed, and FOTO remained relatively consistent  from the last time they were updated.Her 6358m gait speed actually did increase slightly.Pt shows nice maintenance of her functional outcome measures which is consistent with her goals for therapy. Discharge attempted at the end of 2021 and she demonstrated a drop in her function during her prolonged absence out of therapy despite good consistency with her HEP. Additional therapy today focused on strengthening and balance activities today. Pt requires physical therapy intervention to maintain her functional status including LE strength, balance, and gait speed/quality and she has not achieved maximal benefit from therapy services. Shewill benefit fromcontinuedPT services to address these deficits and maintain herfunction at [employer].                            PT Short Term Goals - 03/02/21 1624  PT SHORT TERM GOAL #1   Title Patient will report compliance with HEP for continued strengthening and stability during functional mobility.     Time 6    Period Weeks    Status Achieved             PT Long Term Goals - 03/02/21 1624      PT LONG TERM GOAL #1   Title Patient will increase her FOTO score to at least 59 to demonstrate increased function and mobility for higher quality of life.    Baseline 12/11/20: 51; 03/02/21: 49    Time 12    Period Weeks    Status On-going    Target Date 05/25/21      PT LONG TERM GOAL #2   Title Patient will maintain Berg Balance score of at least 32/56 in order to demonstrate decreased fall risk during functional activities.    Baseline 12/11/20: 32/56; 03/02/21: 32/56    Time 12    Period Weeks    Status On-going    Target Date 05/25/21      PT LONG TERM GOAL #3   Title Patient will be able to maintain safe performance with ascending/descending a flight of stairs using lofstrand crutches and railing in order to allow for safe navigation of her home environment.    Baseline 12/11/20: currently able to use lofstrands  and/or railing with supervision by family; 03/02/21: Good maintenance observed    Time 12    Period Weeks    Status On-going    Target Date 05/25/21      PT LONG TERM GOAL #4   Title Patient will complete a TUG test in no more than 13.5 seconds for independent mobility and decreased fall risk maintenance    Baseline 12/11/20: 13.4s; 03/02/21: 13.5s    Time 12    Period Weeks    Status On-going    Target Date 05/25/21      PT LONG TERM GOAL #5   Title Patient will increase fastest 10 meter walk test to >0.81 m/s as to improve gait speed for better community ambulation and to reduce fall risk.    Baseline 12/11/20: 14.6s = 0.68 m/s; 03/02/21: 14.3s = 0.70 m/s    Time 12    Period Weeks    Status On-going    Target Date 05/25/21                 Plan - 03/02/21 1449    Clinical Impression Statement Pt demonstrates excellent motivation during session today. Updated outcome measures and goals during visit today. Her TUG, BERG, 81m gait speed, and FOTO remained relatively consistent from the last time they were updated. Her 40m gait speed actually did increase slightly. Pt shows nice maintenance of her functional outcome measures which is consistent with her goals for therapy. Discharge attempted at the end of 2021 and she demonstrated a drop in her function during her prolonged absence out of therapy despite good consistency with her HEP. Additional therapy today focused on strengthening and balance activities today. Pt requires physical therapy intervention to maintain her functional status including LE strength, balance, and gait speed/quality and she has not achieved maximal benefit from therapy services. She will benefit from continued PT services to address these deficits and maintain her function at home and school.    Personal Factors and Comorbidities Comorbidity 1;Past/Current Experience;Time since onset of injury/illness/exacerbation;Transportation;Social Background    Comorbidities  spina bifida    Examination-Activity Limitations Bend;Carry;Continence;Lift;Reach Overhead;Squat;Stairs;Stand;Transfers  Examination-Participation Restrictions Church;Community Activity;Driving;Laundry;Meal Prep;Volunteer;Yard Work    Stability/Clinical Decision Making Stable/Uncomplicated    Rehab Potential Good    Clinical Impairments Affecting Rehab Potential weakness and decreased standing balance    PT Frequency Biweekly    PT Duration 12 weeks    PT Treatment/Interventions ADLs/Self Care Home Management;Aquatic Therapy;Biofeedback;Ultrasound;Moist Heat;Iontophoresis 4mg /ml Dexamethasone;Electrical Stimulation;DME Instruction;Gait training;Stair training;Functional mobility training;Neuromuscular re-education;Balance training;Therapeutic exercise;Therapeutic activities;Patient/family education;Orthotic Fit/Training;Wheelchair mobility training;Manual techniques;Dry needling;Passive range of motion;Energy conservation;Splinting;Taping;Vestibular;Visual/perceptual remediation/compensation;Canalith Repostioning;Cryotherapy    PT Next Visit Plan Review HEP, balance and strength exercises    PT Home Exercise Plan Seated, supine, and core program: [id number]    Consulted and Agree with Plan of Care Patient           Patient will benefit from skilled therapeutic intervention in order to improve the following deficits and impairments:  Abnormal gait,Decreased mobility,Decreased balance,Decreased coordination,Difficulty walking,Decreased strength,Impaired tone,Postural dysfunction,Improper body mechanics  Visit Diagnosis: Unsteadiness on feet  Muscle weakness (generalized)     Problem List There are no problems to display for this patient.  Michelle Randall PT, DPT, GCS  Michelle Randall 03/02/2021, 4:28 PM  [place] [facility] [address] [place], [place], [place] Phone: [phone]   Fax:  [phone]  Name: Michelle Randall MRN:  Julaine Hua Date of Birth: December 02, 1995

## 2021-03-05 ENCOUNTER — Ambulatory Visit: Payer: Medicaid Other

## 2021-03-19 NOTE — Patient Instructions (Incomplete)
TREATMENT   Ther-ex NuStep L2 x 2 minutes BUE/BLE during history for warm-up (3 minutes unbilled); Total Gym L16BLE squatsx 10, (AFOs donned); Total Gym L16double leg jumps 2 x 10 BLE (AFOs donned); Total Gym L16single leg squats 2 x 10 BLE (AFOs donned); 6" alternating LE step-ups with BUE support on hand rails (rails not optimal height but intentionally designed to practice community steps)Lunges with light BUE support x 10 with each foot forward(AFOs removed); Seated LAQ with 3# ankle weights x 1 minute; 6" alternating LE step-ups with BUE support and 3# ankle weights x 10 BLE;   Neuromuscular Re-education Forward and retro gait in // bars x 2 lengths each (AFOsdonned); Side stepping in // bars x 2 lengths each (AFOsdonned); WBOS eyes open/closedx 30s each(AFOs removed); NBOS eyesopen/closed x 30s each (AFOsremoved); Semitandem balance alternating forward LE x 30s each (AFOsremoved);   Pt educated throughout session about proper posture and technique with exercises. Improved exercise technique, movement at target joints, use of target muscles after min to mod verbal, visual, tactile cues.    Pt demonstrates excellent motivation during session today.Repeated Total Gym single and double leg squats as well as jumps today butonce againat a more difficult level. Removed AFOs for the majority of session today to challenge ankle and knee stability.She continues to demonstrate typical impairment in static and dynamic positions requiring BUE support. She was able to perform balance exercises today as well in both narrow and wide stance.Continued to work on quad, glut, and hamstring strengthening as steps are her most challenging functional activity at home. Maintained ankle weights for step-ups to challenge hip flexor strength.Will continue to progress balance and LE strengthening in future sessions as well as focus on steps which is currently the biggest functional  challenge for patient.Pt requires physical therapy interventions to maintain her functional status including LE strength, balance, and gait speed/quality and has not achieved maximal benefit from therapy services. Shewill benefit fromcontinuedPT services to address these deficits and maintain herfunction at [employer].

## 2021-03-20 ENCOUNTER — Other Ambulatory Visit: Payer: Self-pay

## 2021-03-20 ENCOUNTER — Ambulatory Visit: Payer: Medicaid Other | Attending: Student

## 2021-03-20 DIAGNOSIS — R2681 Unsteadiness on feet: Secondary | ICD-10-CM | POA: Insufficient documentation

## 2021-03-20 DIAGNOSIS — M6281 Muscle weakness (generalized): Secondary | ICD-10-CM | POA: Diagnosis present

## 2021-03-20 NOTE — Therapy (Signed)
[facility] [address]. [place], [place], [address] Phone: [phone]   Fax:  [phone]  Physical Therapy Treatment  Patient Details  Name: Michelle Randall MRN: [id number] Date of Birth: 1996-05-04 Referring Provider (PT): Carren Rang PA   Encounter Date: 03/20/2021   PT End of Session - 03/20/21 1156    Visit Number 8    Number of Visits 13    Date for PT Re-Evaluation 05/25/21    Authorization Type eval: 12/11/20, recert: 03/02/21    Authorization Time Period 12 visits 01/22/21-07/08/21    Authorization - Visit Number 5    Authorization - Number of Visits 12    PT Start Time 1150    PT Stop Time 1235    PT Time Calculation (min) 45 min    Equipment Utilized During Treatment Gait belt;Other (comment)   Bilateral AFOs   Activity Tolerance Patient tolerated treatment well    Behavior During Therapy WFL for tasks assessed/performed           Past Medical History:  Diagnosis Date  . Neurogenic bladder   . Neurogenic bowel   . S/P VP shunt   . Spina bifida [facility])     Past Surgical History:  Procedure Laterality Date  . VENTRICULOPERITONEAL SHUNT      There were no vitals filed for this visit.   Subjective Assessment - 03/20/21 1154    Subjective Pt reports that she is doing well today. She denies any pain upon arrival. No falls since last therapy session and no updates. New stair lift installed last week. No specific questions or concerns currently.    Pertinent History Patient is a pleasant 25 year old female who presents for continuation of care for generalized weakness/ coordination deficits secondary to diagnosis of spina bifida. PMH includes VP shunt, spina bifida, and neurogenic bowel/bladder. Patient was discharged from this clinic at the end of 2021 due to meeting therapy cap for insurance that year. Patient reports that she has continued performing her HEP but notes a decline when she stops therapy due to  inability to safely perform all of the balance and strength exercises as at home. One of the biggest challenge she is experiencing is stair navigation  She reports having multiple near falls when negotiating stairs at home. Patient must ascend/descend stairs to reach bedroom upstairs increasing her risk for falls daily.    Limitations Walking;Standing;House hold activities;Other (comment)   Stairs   How long can you sit comfortably? n/a    How long can you stand comfortably? require use of bilateral lofstrand crutches    How long can you walk comfortably? dependent upon terrain    Patient Stated Goals Patient wants to be able to negotiate uneven terrain, cross thresholds in home environment, and negotiate steps in household    Currently in Pain? No/denies                  TREATMENT   Ther-ex NuStep L2 x 5 minutes BLE only during history for warm-up (2 minutes unbilled); Total Gym L18BLE squatsx 10, (AFOs doffed); Total Gym L18double leg jumps 2 x 10 BLE (AFOs doffed); Total Gym L18single leg squats 2 x 20 BLE (AFOs doffed); Nautilus resisted gait 20# forward and backward x 3 each direction (AFO donned); 6" alternating LE step-ups with BUE support x 5 on each side;   Neuromuscular Re-education All balance exercises performed in // bars without UE support unless otherwise specified; WBOS eyes open/closedx  30s each(AFOs donned); WBOS eyes open with horizontal and vertical head turns x 30s each(AFOs donned); NBOS eyes open/closedx 30s each(AFOs donned); NBOS eyes open with horizontal and vertical head turns x 30s each(AFOs donned); Semitandem balance eyes openx 30s with each foot forward(AFOs donned);   Pt educated throughout session about proper posture and technique with exercises. Improved exercise technique, movement at target joints, use of target muscles after min to mod verbal, visual, tactile cues.    Pt demonstrates excellent motivation during  session today.Repeated Total Gym single and double leg squats as well as jumps today butonce againat a more difficult level. She continues to demonstrate typical impairment in static and dynamic positions requiring BUE support. She was able to perform balance exercises today as well in both narrow and wide stance and well as semitandem. Introduced resisted gait today with the Nautilus machine. Continued to work on quad, glut, and hamstring strengthening as steps are her most challenging functional activity at home.Will continue to progress balance and LE strengthening in future sessions as well as focus on steps which is currently the biggest functional challenge for patient.Pt requires physical therapy interventions to maintain her functional status including LE strength, balance, and gait speed/quality and has not achieved maximal benefit from therapy services. Shewill benefit fromcontinuedPT services to address these deficits and maintain herfunction at [employer].                      PT Short Term Goals - 03/02/21 1624      PT SHORT TERM GOAL #1   Title Patient will report compliance with HEP for continued strengthening and stability during functional mobility.     Time 6    Period Weeks    Status Achieved             PT Long Term Goals - 03/02/21 1624      PT LONG TERM GOAL #1   Title Patient will increase her FOTO score to at least 59 to demonstrate increased function and mobility for higher quality of life.    Baseline 12/11/20: 51; 03/02/21: 49    Time 12    Period Weeks    Status On-going    Target Date 05/25/21      PT LONG TERM GOAL #2   Title Patient will maintain Berg Balance score of at least 32/56 in order to demonstrate decreased fall risk during functional activities.    Baseline 12/11/20: 32/56; 03/02/21: 32/56    Time 12    Period Weeks    Status On-going    Target Date 05/25/21      PT LONG TERM GOAL #3   Title Patient will be able  to maintain safe performance with ascending/descending a flight of stairs using lofstrand crutches and railing in order to allow for safe navigation of her home environment.    Baseline 12/11/20: currently able to use lofstrands and/or railing with supervision by family; 03/02/21: Good maintenance observed    Time 12    Period Weeks    Status On-going    Target Date 05/25/21      PT LONG TERM GOAL #4   Title Patient will complete a TUG test in no more than 13.5 seconds for independent mobility and decreased fall risk maintenance    Baseline 12/11/20: 13.4s; 03/02/21: 13.5s    Time 12    Period Weeks    Status On-going    Target Date 05/25/21      PT LONG  TERM GOAL #5   Title Patient will increase fastest 10 meter walk test to >0.81 m/s as to improve gait speed for better community ambulation and to reduce fall risk.    Baseline 12/11/20: 14.6s = 0.68 m/s; 03/02/21: 14.3s = 0.70 m/s    Time 12    Period Weeks    Status On-going    Target Date 05/25/21                 Plan - 03/20/21 1157    Clinical Impression Statement Pt demonstrates excellent motivation during session today. Repeated Total Gym single and double leg squats as well as jumps today but once again at a more difficult level. She continues to demonstrate typical impairment in static and dynamic positions requiring BUE support. She was able to perform balance exercises today as well in both narrow and wide stance and well as semitandem. Introduced resisted gait today with the Nautilus machine. Continued to work on quad, glut, and hamstring strengthening as steps are her most challenging functional activity at home. Will continue to progress balance and LE strengthening in future sessions as well as focus on steps which is currently the biggest functional challenge for patient. Pt requires physical therapy interventions to maintain her functional status including LE strength, balance, and gait speed/quality and has not achieved  maximal benefit from therapy services. She will benefit from continued PT services to address these deficits and maintain her function at home and school.    Personal Factors and Comorbidities Comorbidity 1;Past/Current Experience;Time since onset of injury/illness/exacerbation;Transportation;Social Background    Comorbidities spina bifida    Examination-Activity Limitations Bend;Carry;Continence;Lift;Reach Overhead;Squat;Stairs;Stand;Transfers    Examination-Participation Restrictions Church;Community Activity;Driving;Laundry;Meal Prep;Volunteer;Yard Work    Stability/Clinical Decision Making Stable/Uncomplicated    Rehab Potential Good    Clinical Impairments Affecting Rehab Potential weakness and decreased standing balance    PT Frequency Biweekly    PT Duration 12 weeks    PT Treatment/Interventions ADLs/Self Care Home Management;Aquatic Therapy;Biofeedback;Ultrasound;Moist Heat;Iontophoresis 4mg /ml Dexamethasone;Electrical Stimulation;DME Instruction;Gait training;Stair training;Functional mobility training;Neuromuscular re-education;Balance training;Therapeutic exercise;Therapeutic activities;Patient/family education;Orthotic Fit/Training;Wheelchair mobility training;Manual techniques;Dry needling;Passive range of motion;Energy conservation;Splinting;Taping;Vestibular;Visual/perceptual remediation/compensation;Canalith Repostioning;Cryotherapy    PT Next Visit Plan Review HEP, balance and strength exercises    PT Home Exercise Plan Seated, supine, and core program: [id number]    Consulted and Agree with Plan of Care Patient           Patient will benefit from skilled therapeutic intervention in order to improve the following deficits and impairments:  Abnormal gait,Decreased mobility,Decreased balance,Decreased coordination,Difficulty walking,Decreased strength,Impaired tone,Postural dysfunction,Improper body mechanics  Visit Diagnosis: Unsteadiness on feet  Muscle weakness  (generalized)     Problem List There are no problems to display for this patient.  Gretchen Weinfeld PT, DPT, GCS  Jatinder Mcdonagh 03/20/2021, 3:39 PM  [facility] [address] [place], [place], [place] Phone: [phone]   Fax:  [phone]  Name: Michelle Randall MRN: [id number] Date of Birth: 06/03/1996

## 2021-04-01 NOTE — Patient Instructions (Addendum)
TREATMENT   Ther-ex NuStep L2 x21minutes BLE only during history for warm-up(2 minutesunbilled); Total Gym L18BLE squatsx 10,(AFOsdoffed); Total Gym L18double leg jumps 2 x 10 BLE (AFOsdoffed); Total Gym L18single leg squats2x 20 BLE (AFOsdoffed); Nautilus resisted gait 20# forward and backward x 3 each direction (AFO donned); 6" alternating LE step-ups with BUE support x 5 on each side;   Neuromuscular Re-education All balance exercises performed in // bars without UE support unless otherwise specified; WBOS eyes open/closedx 30s each(AFOs donned); WBOS eyes open with horizontal and vertical head turns x 30s each(AFOs donned); NBOS eyes open/closedx 30s each(AFOs donned); NBOS eyes open with horizontal and vertical head turns x 30s each(AFOs donned); Semitandem balance eyes openx 30s with each foot forward(AFOs donned);   Pt educated throughout session about proper posture and technique with exercises. Improved exercise technique, movement at target joints, use of target muscles after min to mod verbal, visual, tactile cues.    Pt demonstrates excellent motivation during session today.Repeated Total Gym single and double leg squats as well as jumps today butonce againat a more difficult level. She continues to demonstrate typical impairment in static and dynamic positions requiring BUE support. She was able to perform balance exercises today as well in both narrow and wide stance and well as semitandem. Introduced resisted gait today with the Nautilus machine. Continued to work on quad, glut, and hamstring strengthening as steps are her most challenging functional activity at home.Will continue to progress balance and LE strengthening in future sessions as well as focus on steps which is currently the biggest functional challenge for patient.Pt requires physical therapy interventions to maintain her functional status including LE strength, balance, and  gait speed/quality and has not achieved maximal benefit from therapy services. Shewill benefit fromcontinuedPT services to address these deficits and maintain herfunction at [employer].

## 2021-04-02 ENCOUNTER — Other Ambulatory Visit: Payer: Self-pay

## 2021-04-02 ENCOUNTER — Ambulatory Visit: Payer: Medicaid Other

## 2021-04-02 DIAGNOSIS — R2681 Unsteadiness on feet: Secondary | ICD-10-CM | POA: Diagnosis not present

## 2021-04-02 DIAGNOSIS — M6281 Muscle weakness (generalized): Secondary | ICD-10-CM

## 2021-04-02 NOTE — Therapy (Signed)
[facility] [address]. [place], [place], [address] Phone: [phone]   Fax:  [phone]  Physical Therapy Treatment  Patient Details  Name: Michelle Randall MRN: [id number] Date of Birth: [birth year] Referring Provider (PT): Carren Rang PA   Encounter Date: 04/02/2021   PT End of Session - 04/03/21 1420    Visit Number 9    Number of Visits 13    Date for PT Re-Evaluation 05/25/21    Authorization Type eval: 12/11/20, recert: 03/02/21    Authorization Time Period 12 visits 01/22/21-07/08/21    Authorization - Visit Number 5    Authorization - Number of Visits 12    Equipment Utilized During Treatment Gait belt;Other (comment)   Bilateral AFOs   Activity Tolerance Patient tolerated treatment well    Behavior During Therapy WFL for tasks assessed/performed           Past Medical History:  Diagnosis Date  . Neurogenic bladder   . Neurogenic bowel   . S/P VP shunt   . Spina bifida [facility])     Past Surgical History:  Procedure Laterality Date  . VENTRICULOPERITONEAL SHUNT      There were no vitals filed for this visit.   Subjective Assessment - 04/02/21 1716    Subjective Pt reports that she is doing well today. She denies any pain upon arrival. No updates since last therapy session. New stair lift continues to work well. No specific questions or concerns currently.    Pertinent History Patient is a pleasant 25 year old female who presents for continuation of care for generalized weakness/ coordination deficits secondary to diagnosis of spina bifida. PMH includes VP shunt, spina bifida, and neurogenic bowel/bladder. Patient was discharged from this clinic at the end of 2021 due to meeting therapy cap for insurance that year. Patient reports that she has continued performing her HEP but notes a decline when she stops therapy due to inability to safely perform all of the balance and strength exercises as at home. One of the  biggest challenge she is experiencing is stair navigation  She reports having multiple near falls when negotiating stairs at home. Patient must ascend/descend stairs to reach bedroom upstairs increasing her risk for falls daily.    Limitations Walking;Standing;House hold activities;Other (comment)   Stairs   How long can you sit comfortably? n/a    How long can you stand comfortably? require use of bilateral lofstrand crutches    How long can you walk comfortably? dependent upon terrain    Patient Stated Goals Patient wants to be able to negotiate uneven terrain, cross thresholds in home environment, and negotiate steps in household    Currently in Pain? No/denies               TREATMENT   Ther-ex NuStep L2-4 x59minutes BLE only during history for warm-up(unbilled) Total Gym L19BLE squats 2x 10,(AFOsdoffed); Total Gym L19double leg jumps 2 x 10 BLE (AFOsdoffed); Total Gym L19single leg squats2x 20 BLE (AFOsdoffed); Alternating LE lunges in // bars with BUE support x 10 BLE: 6" alternating LE step-ups with BUE support x 5 on each side;   Neuromuscular Re-education All balance exercises performed in // bars without UE support unless otherwise specified; WBOS eyes open/closedx 30s each(AFOs donned); WBOS eyes open with horizontal and vertical head turns x 30s each(AFOs donned); NBOS eyes open/closedx 30s each(AFOs donned); NBOS eyes open with horizontal and vertical head turns x 30s each(AFOs donned); Semitandem balance eyes openx  30s with each foot forward(AFOs donned); Forward/backward gait in // bars with BUE support x 6 lengths; Lateral gait in // bars with BUE support x 6 lengths;   Pt educated throughout session about proper posture and technique with exercises. Improved exercise technique, movement at target joints, use of target muscles after min to mod verbal, visual, tactile cues.    Pt demonstrates excellent motivation during session  today.Repeated Total Gym single and double leg squats as well as jumps today butonce againat a more difficult level. She continues to demonstrate typical impairment in static and dynamic positions requiring BUE support. She was able to perform balance exercises today as well in both narrow and wide stance and well as semitandem. Continued to work on quad, glut, and hamstring strengthening as steps are her most challenging functional activity at home.Will continue to progress balance and LE strengthening in future sessions as well as focus on steps which is currently the biggest functional challenge for patient.She need a progress note at her next visit. Pt requires physical therapy interventions to maintain her functional status including LE strength, balance, and gait speed/quality and has not achieved maximal benefit from therapy services. Shewill benefit fromcontinuedPT services to address these deficits and maintain herfunction at [employer].                         PT Short Term Goals - 03/02/21 1624      PT SHORT TERM GOAL #1   Title Patient will report compliance with HEP for continued strengthening and stability during functional mobility.     Time 6    Period Weeks    Status Achieved             PT Long Term Goals - 03/02/21 1624      PT LONG TERM GOAL #1   Title Patient will increase her FOTO score to at least 59 to demonstrate increased function and mobility for higher quality of life.    Baseline 12/11/20: 51; 03/02/21: 49    Time 12    Period Weeks    Status On-going    Target Date 05/25/21      PT LONG TERM GOAL #2   Title Patient will maintain Berg Balance score of at least 32/56 in order to demonstrate decreased fall risk during functional activities.    Baseline 12/11/20: 32/56; 03/02/21: 32/56    Time 12    Period Weeks    Status On-going    Target Date 05/25/21      PT LONG TERM GOAL #3   Title Patient will be able to maintain safe  performance with ascending/descending a flight of stairs using lofstrand crutches and railing in order to allow for safe navigation of her home environment.    Baseline 12/11/20: currently able to use lofstrands and/or railing with supervision by family; 03/02/21: Good maintenance observed    Time 12    Period Weeks    Status On-going    Target Date 05/25/21      PT LONG TERM GOAL #4   Title Patient will complete a TUG test in no more than 13.5 seconds for independent mobility and decreased fall risk maintenance    Baseline 12/11/20: 13.4s; 03/02/21: 13.5s    Time 12    Period Weeks    Status On-going    Target Date 05/25/21      PT LONG TERM GOAL #5   Title Patient will increase fastest 10 meter walk  test to >0.81 m/s as to improve gait speed for better community ambulation and to reduce fall risk.    Baseline 12/11/20: 14.6s = 0.68 m/s; 03/02/21: 14.3s = 0.70 m/s    Time 12    Period Weeks    Status On-going    Target Date 05/25/21                 Plan - 04/03/21 1420    Clinical Impression Statement Pt demonstrates excellent motivation during session today. Repeated Total Gym single and double leg squats as well as jumps today but once again at a more difficult level. She continues to demonstrate typical impairment in static and dynamic positions requiring BUE support. She was able to perform balance exercises today as well in both narrow and wide stance and well as semitandem. Continued to work on quad, glut, and hamstring strengthening as steps are her most challenging functional activity at home. Will continue to progress balance and LE strengthening in future sessions as well as focus on steps which is currently the biggest functional challenge for patient. She need a progress note at her next visit. Pt requires physical therapy interventions to maintain her functional status including LE strength, balance, and gait speed/quality and has not achieved maximal benefit from therapy  services. She will benefit from continued PT services to address these deficits and maintain her function at home and school.    Personal Factors and Comorbidities Comorbidity 1;Past/Current Experience;Time since onset of injury/illness/exacerbation;Transportation;Social Background    Comorbidities spina bifida    Examination-Activity Limitations Bend;Carry;Continence;Lift;Reach Overhead;Squat;Stairs;Stand;Transfers    Examination-Participation Restrictions Church;Community Activity;Driving;Laundry;Meal Prep;Volunteer;Yard Work    Stability/Clinical Decision Making Stable/Uncomplicated    Rehab Potential Good    Clinical Impairments Affecting Rehab Potential weakness and decreased standing balance    PT Frequency Biweekly    PT Duration 12 weeks    PT Treatment/Interventions ADLs/Self Care Home Management;Aquatic Therapy;Biofeedback;Ultrasound;Moist Heat;Iontophoresis 4mg /ml Dexamethasone;Electrical Stimulation;DME Instruction;Gait training;Stair training;Functional mobility training;Neuromuscular re-education;Balance training;Therapeutic exercise;Therapeutic activities;Patient/family education;Orthotic Fit/Training;Wheelchair mobility training;Manual techniques;Dry needling;Passive range of motion;Energy conservation;Splinting;Taping;Vestibular;Visual/perceptual remediation/compensation;Canalith Repostioning;Cryotherapy    PT Next Visit Plan Progress note, review HEP, balance and strength exercises    PT Home Exercise Plan Seated, supine, and core program: [id number]    Consulted and Agree with Plan of Care Patient           Patient will benefit from skilled therapeutic intervention in order to improve the following deficits and impairments:  Abnormal gait,Decreased mobility,Decreased balance,Decreased coordination,Difficulty walking,Decreased strength,Impaired tone,Postural dysfunction,Improper body mechanics  Visit Diagnosis: Unsteadiness on feet  Muscle weakness  (generalized)     Problem List There are no problems to display for this patient.  Donovyn Guidice PT, DPT, GCS  Keltin Baird 04/03/2021, 2:26 PM  [facility] [address] [place], [place], [place] Phone: [phone]   Fax:  [phone]  Name: CHEYENNA PANKOWSKI MRN: Julaine Hua Date of Birth: 1996-07-16

## 2021-04-16 ENCOUNTER — Other Ambulatory Visit: Payer: Self-pay

## 2021-04-16 ENCOUNTER — Ambulatory Visit: Payer: Medicaid Other | Attending: Student

## 2021-04-16 DIAGNOSIS — R262 Difficulty in walking, not elsewhere classified: Secondary | ICD-10-CM | POA: Insufficient documentation

## 2021-04-16 DIAGNOSIS — R2689 Other abnormalities of gait and mobility: Secondary | ICD-10-CM | POA: Diagnosis present

## 2021-04-16 DIAGNOSIS — M6281 Muscle weakness (generalized): Secondary | ICD-10-CM | POA: Diagnosis present

## 2021-04-16 DIAGNOSIS — R2681 Unsteadiness on feet: Secondary | ICD-10-CM | POA: Insufficient documentation

## 2021-04-16 NOTE — Therapy (Signed)
[facility] [address]. [place], [place], [address] Phone: [phone]   Fax:  [phone]  Physical Therapy Progress Note   Dates of reporting period  12/11/20   to   04/16/21  Patient Details  Name: Michelle Randall MRN: [id number] Date of Birth: [birth year] Referring Provider (PT): Carren Rang PA   Encounter Date: 04/16/2021   PT End of Session - 04/16/21 1713    Visit Number 10    Number of Visits 13    Date for PT Re-Evaluation 05/25/21    Authorization Type eval: 12/11/20, recert: 03/02/21    Authorization Time Period 12 visits 01/22/21-07/08/21    Authorization - Visit Number 7    Authorization - Number of Visits 12    PT Start Time 1710    PT Stop Time 1745    PT Time Calculation (min) 35 min    Equipment Utilized During Treatment Gait belt;Other (comment)   Bilateral AFOs   Activity Tolerance Patient tolerated treatment well    Behavior During Therapy WFL for tasks assessed/performed           Past Medical History:  Diagnosis Date  . Neurogenic bladder   . Neurogenic bowel   . S/P VP shunt   . Spina bifida [facility])     Past Surgical History:  Procedure Laterality Date  . VENTRICULOPERITONEAL SHUNT      There were no vitals filed for this visit.   Subjective Assessment - 04/16/21 1712    Subjective Pt reports that she is doing well today. She denies any pain upon arrival. No updates since last therapy session. No specific questions or concerns currently.    Pertinent History Patient is a pleasant 25 year old female who presents for continuation of care for generalized weakness/ coordination deficits secondary to diagnosis of spina bifida. PMH includes VP shunt, spina bifida, and neurogenic bowel/bladder. Patient was discharged from this clinic at the end of 2021 due to meeting therapy cap for insurance that year. Patient reports that she has continued performing her HEP but notes a decline when she stops therapy  due to inability to safely perform all of the balance and strength exercises as at home. One of the biggest challenge she is experiencing is stair navigation  She reports having multiple near falls when negotiating stairs at home. Patient must ascend/descend stairs to reach bedroom upstairs increasing her risk for falls daily.    Limitations Walking;Standing;House hold activities;Other (comment)   Stairs   How long can you sit comfortably? n/a    How long can you stand comfortably? require use of bilateral lofstrand crutches    How long can you walk comfortably? dependent upon terrain    Patient Stated Goals Patient wants to be able to negotiate uneven terrain, cross thresholds in home environment, and negotiate steps in household    Currently in Pain? No/denies               TREATMENT   Ther-ex NuStep L2-4 x74minutes BLEonlyduring history for warm-up(unbilled) Total Gym L21BLE squats 2x 10,(AFOsdonned); Total Gym L21double leg jumps 2 x 10 BLE (AFOsdonned); Total Gym L21single leg squats2x20 BLE (AFOsdonned); Supine SLR hip flexion x 10 BLE; Supine straight leg hip abduction x 10 BLE, Supine straight leg hip adduction x 10 BLE, Hooklying marching x 10 BLE; Hooklying SAQ over bolster 2 x 10 BLE, with manual resistance; Hooklying clams x 10 BLE, with manual resistance; Hooklying adductor squeeze x 10 BLE, with manual  resistance; Hooklying bridges with arms across chest 2 x 10; Hooklying alternating heel taps starting in 90/90 position with cues from therapist to keep low back on mat table x 10 BLE; Hooklying dead bug (supine with hips, knees, and shoulders flexed to 90 starting position then alternating hip/knee extension and contralateral shoulder flexion with abdominal activation). 3 second holds x 10 on each side, cuing for core activation  Bolster knee bridges x 10   Pt educated throughout session about proper posture and technique with exercises. Improved  exercise technique, movement at target joints, use of target muscles after min to mod verbal, visual, tactile cues.    Pt demonstrates excellent motivation during session today.Repeated Total Gym single and double leg squats as well as jumps today butonce againat a more difficult level. She continues to demonstrate typical impairment in static and dynamic positions requiring BUE support. Updated outcome measures and goalsduring 03/02/21 visit. Her TUG, BERG, 77m gait speed, and FOTO remained relatively consistent from the last time they were updated.Her 78m gait speed actually did increase slightly.Pt shows nice maintenance of her functional outcome measures which is consistent with her goals for therapy. Discharge attempted at the end of 2021 and she demonstrated a drop in her function during her prolonged absence out of therapy despite good consistency with her HEP. Performed mat table strengthening today. Pt requires physical therapy intervention to maintain her functional status including LE strength, balance, and gait speed/qualityand she has not achieved maximal benefit from therapy services. Shewill benefit fromcontinuedPT services to address these deficits and maintain herfunction at [employer].                  PT Short Term Goals - 04/16/21 2150      PT SHORT TERM GOAL #1   Title Patient will report compliance with HEP for continued strengthening and stability during functional mobility.     Time 6    Period Weeks    Status Achieved             PT Long Term Goals - 04/16/21 2150      PT LONG TERM GOAL #1   Title Patient will increase her FOTO score to at least 59 to demonstrate increased function and mobility for higher quality of life.    Baseline 12/11/20: 51; 03/02/21: 49    Time 12    Period Weeks    Status On-going    Target Date 05/25/21      PT LONG TERM GOAL #2   Title Patient will maintain Berg Balance score of at least 32/56 in order to  demonstrate decreased fall risk during functional activities.    Baseline 12/11/20: 32/56; 03/02/21: 32/56    Time 12    Period Weeks    Status On-going    Target Date 05/25/21      PT LONG TERM GOAL #3   Title Patient will be able to maintain safe performance with ascending/descending a flight of stairs using lofstrand crutches and railing in order to allow for safe navigation of her home environment.    Baseline 12/11/20: currently able to use lofstrands and/or railing with supervision by family; 03/02/21: Good maintenance observed    Time 12    Period Weeks    Status On-going    Target Date 05/25/21      PT LONG TERM GOAL #4   Title Patient will complete a TUG test in no more than 13.5 seconds for independent mobility and decreased fall risk maintenance  Baseline 12/11/20: 13.4s; 03/02/21: 13.5s    Time 12    Period Weeks    Status On-going    Target Date 05/25/21      PT LONG TERM GOAL #5   Title Patient will increase fastest 10 meter walk test to >0.81 m/s as to improve gait speed for better community ambulation and to reduce fall risk.    Baseline 12/11/20: 14.6s = 0.68 m/s; 03/02/21: 14.3s = 0.70 m/s    Time 12    Period Weeks    Status On-going    Target Date 05/25/21                 Plan - 04/16/21 1713    Clinical Impression Statement Pt demonstrates excellent motivation during session today. Repeated Total Gym single and double leg squats as well as jumps today but once again at a more difficult level. She continues to demonstrate typical impairment in static and dynamic positions requiring BUE support. Updated outcome measures and goals during 03/02/21 visit. Her TUG, BERG, 31m gait speed, and FOTO remained relatively consistent from the last time they were updated. Her 75m gait speed actually did increase slightly. Pt shows nice maintenance of her functional outcome measures which is consistent with her goals for therapy. Discharge attempted at the end of 2021 and she  demonstrated a drop in her function during her prolonged absence out of therapy despite good consistency with her HEP. Performed mat table strengthening today. Pt requires physical therapy intervention to maintain her functional status including LE strength, balance, and gait speed/quality and she has not achieved maximal benefit from therapy services. She will benefit from continued PT services to address these deficits and maintain her function at home and school.    Personal Factors and Comorbidities Comorbidity 1;Past/Current Experience;Time since onset of injury/illness/exacerbation;Transportation;Social Background    Comorbidities spina bifida    Examination-Activity Limitations Bend;Carry;Continence;Lift;Reach Overhead;Squat;Stairs;Stand;Transfers    Examination-Participation Restrictions Church;Community Activity;Driving;Laundry;Meal Prep;Volunteer;Yard Work    Stability/Clinical Decision Making Stable/Uncomplicated    Rehab Potential Good    Clinical Impairments Affecting Rehab Potential weakness and decreased standing balance    PT Frequency Biweekly    PT Duration 12 weeks    PT Treatment/Interventions ADLs/Self Care Home Management;Aquatic Therapy;Biofeedback;Ultrasound;Moist Heat;Iontophoresis 4mg /ml Dexamethasone;Electrical Stimulation;DME Instruction;Gait training;Stair training;Functional mobility training;Neuromuscular re-education;Balance training;Therapeutic exercise;Therapeutic activities;Patient/family education;Orthotic Fit/Training;Wheelchair mobility training;Manual techniques;Dry needling;Passive range of motion;Energy conservation;Splinting;Taping;Vestibular;Visual/perceptual remediation/compensation;Canalith Repostioning;Cryotherapy    PT Next Visit Plan review HEP, balance and strength exercises    PT Home Exercise Plan Seated, supine, and core program: [id number]    Consulted and Agree with Plan of Care Patient           Patient will benefit from skilled therapeutic  intervention in order to improve the following deficits and impairments:  Abnormal gait,Decreased mobility,Decreased balance,Decreased coordination,Difficulty walking,Decreased strength,Impaired tone,Postural dysfunction,Improper body mechanics  Visit Diagnosis: Unsteadiness on feet  Muscle weakness (generalized)  Other abnormalities of gait and mobility     Problem List There are no problems to display for this patient.  PT, DPT, GCS  Nathanuel Cabreja 04/16/2021, 9:53 PM  [facility] [address]. [place], [place], [place] Phone: [phone]   Fax:  [phone]  Name: Michelle Randall MRN: Julaine Hua Date of Birth: 1995/12/29

## 2021-04-30 ENCOUNTER — Ambulatory Visit: Payer: Medicaid Other

## 2021-04-30 DIAGNOSIS — R262 Difficulty in walking, not elsewhere classified: Secondary | ICD-10-CM

## 2021-04-30 DIAGNOSIS — R2681 Unsteadiness on feet: Secondary | ICD-10-CM | POA: Diagnosis not present

## 2021-04-30 DIAGNOSIS — M6281 Muscle weakness (generalized): Secondary | ICD-10-CM

## 2021-04-30 NOTE — Therapy (Signed)
[facility] [address] [place], [place], [address] Phone: [phone]   Fax:  [phone]  Physical Therapy Treatment  Patient Details  Name: Michelle Randall MRN: [id number] Date of Birth: 1996-05-29 Referring Provider (PT): Carren Rang PA   Encounter Date: 04/30/2021   PT End of Session - 04/30/21 1716     Visit Number 11    Number of Visits 13    Date for PT Re-Evaluation 05/25/21    Authorization Type eval: 12/11/20, recert: 03/02/21    Authorization Time Period 12 visits 01/22/21-07/08/21    Authorization - Visit Number 8    Authorization - Number of Visits 12    PT Start Time 1705    PT Stop Time 1748    PT Time Calculation (min) 43 min    Equipment Utilized During Treatment Gait belt;Other (comment)   Bilateral AFOs   Activity Tolerance Patient tolerated treatment well    Behavior During Therapy WFL for tasks assessed/performed             Past Medical History:  Diagnosis Date   Neurogenic bladder    Neurogenic bowel    S/P VP shunt    Spina bifida [facility])     Past Surgical History:  Procedure Laterality Date   VENTRICULOPERITONEAL SHUNT      There were no vitals filed for this visit.   Subjective Assessment - 04/30/21 1712     Subjective Pt reports that she is doing well today. She denies any pain upon arrival. No updates since last therapy session. No specific questions or concerns currently.    Pertinent History Patient is a pleasant 25 year old female who presents for continuation of care for generalized weakness/ coordination deficits secondary to diagnosis of spina bifida. PMH includes VP shunt, spina bifida, and neurogenic bowel/bladder. Patient was discharged from this clinic at the end of 2021 due to meeting therapy cap for insurance that year. Patient reports that she has continued performing her HEP but notes a decline when she stops therapy due to inability to safely perform all of the balance  and strength exercises as at home. One of the biggest challenge she is experiencing is stair navigation  She reports having multiple near falls when negotiating stairs at home. Patient must ascend/descend stairs to reach bedroom upstairs increasing her risk for falls daily.    Limitations Walking;Standing;House hold activities;Other (comment)   Stairs   How long can you sit comfortably? n/a    How long can you stand comfortably? require use of bilateral lofstrand crutches    How long can you walk comfortably? dependent upon terrain    Patient Stated Goals Patient wants to be able to negotiate uneven terrain, cross thresholds in home environment, and negotiate steps in household    Currently in Pain? No/denies                        TREATMENT     Ther-ex  NuStep L2-4 x 5 minutes BLE only during history for warm-up (unbilled) Total Gym L22 BLE squats 2 x 20, (AFOs donned); Total Gym L22 double leg jumps 2 x 10 BLE (AFOs donned); Total Gym L22 single leg squats 2 x 10 BLE (AFOs donned); Mini squats in // bars with BUE support x 15; 6" alternating step-ups with BUE support x 10 BLE;   Neuromuscular Re-education  Wide base of support static balance with eyes open/closed; Staggered stance balance with front  foot on 6" step alternating forward LE;    Pt educated throughout session about proper posture and technique with exercises. Improved exercise technique, movement at target joints, use of target muscles after min to mod verbal, visual, tactile cues.       Pt demonstrates excellent motivation during session today. Repeated Total Gym single and double leg squats as well as jumps today but once again at a more difficult level. She continues to demonstrate typical impairment in static and dynamic positions requiring BUE support. Introduced staggered balance with front foot on step. She will need a re certification upcoming at which point outcome measures/goals will be updated.  Pt requires physical therapy intervention to maintain her functional status including LE strength, balance, and gait speed/quality and she has not achieved maximal benefit from therapy services. She will benefit from continued PT services to address these deficits and maintain her function at home and school.                PT Short Term Goals - 04/16/21 2150       PT SHORT TERM GOAL #1   Title Patient will report compliance with HEP for continued strengthening and stability during functional mobility.     Time 6    Period Weeks    Status Achieved               PT Long Term Goals - 04/16/21 2150       PT LONG TERM GOAL #1   Title Patient will increase her FOTO score to at least 59 to demonstrate increased function and mobility for higher quality of life.    Baseline 12/11/20: 51; 03/02/21: 49    Time 12    Period Weeks    Status On-going    Target Date 05/25/21      PT LONG TERM GOAL #2   Title Patient will maintain Berg Balance score of at least 32/56 in order to demonstrate decreased fall risk during functional activities.    Baseline 12/11/20: 32/56; 03/02/21: 32/56    Time 12    Period Weeks    Status On-going    Target Date 05/25/21      PT LONG TERM GOAL #3   Title Patient will be able to maintain safe performance with ascending/descending a flight of stairs using lofstrand crutches and railing in order to allow for safe navigation of her home environment.    Baseline 12/11/20: currently able to use lofstrands and/or railing with supervision by family; 03/02/21: Good maintenance observed    Time 12    Period Weeks    Status On-going    Target Date 05/25/21      PT LONG TERM GOAL #4   Title Patient will complete a TUG test in no more than 13.5 seconds for independent mobility and decreased fall risk maintenance    Baseline 12/11/20: 13.4s; 03/02/21: 13.5s    Time 12    Period Weeks    Status On-going    Target Date 05/25/21      PT LONG TERM GOAL #5    Title Patient will increase fastest 10 meter walk test to >0.81 m/s as to improve gait speed for better community ambulation and to reduce fall risk.    Baseline 12/11/20: 14.6s = 0.68 m/s; 03/02/21: 14.3s = 0.70 m/s    Time 12    Period Weeks    Status On-going    Target Date 05/25/21  Plan - 05/01/21 1233     Clinical Impression Statement Pt demonstrates excellent motivation during session today. Repeated Total Gym single and double leg squats as well as jumps today but once again at a more difficult level. She continues to demonstrate typical impairment in static and dynamic positions requiring BUE support. Introduced staggered balance with front foot on step. She will need a re certification upcoming at which point outcome measures/goals will be updated. Pt requires physical therapy intervention to maintain her functional status including LE strength, balance, and gait speed/quality and she has not achieved maximal benefit from therapy services. She will benefit from continued PT services to address these deficits and maintain her function at home and school.    Personal Factors and Comorbidities Comorbidity 1;Past/Current Experience;Time since onset of injury/illness/exacerbation;Transportation;Social Background    Comorbidities spina bifida    Examination-Activity Limitations Bend;Carry;Continence;Lift;Reach Overhead;Squat;Stairs;Stand;Transfers    Examination-Participation Restrictions Church;Community Activity;Driving;Laundry;Meal Prep;Volunteer;Yard Work    Stability/Clinical Decision Making Stable/Uncomplicated    Rehab Potential Good    Clinical Impairments Affecting Rehab Potential weakness and decreased standing balance    PT Frequency Biweekly    PT Duration 12 weeks    PT Treatment/Interventions ADLs/Self Care Home Management;Aquatic Therapy;Biofeedback;Ultrasound;Moist Heat;Iontophoresis 4mg /ml Dexamethasone;Electrical Stimulation;DME Instruction;Gait  training;Stair training;Functional mobility training;Neuromuscular re-education;Balance training;Therapeutic exercise;Therapeutic activities;Patient/family education;Orthotic Fit/Training;Wheelchair mobility training;Manual techniques;Dry needling;Passive range of motion;Energy conservation;Splinting;Taping;Vestibular;Visual/perceptual remediation/compensation;Canalith Repostioning;Cryotherapy    PT Next Visit Plan review HEP, balance and strength exercises    PT Home Exercise Plan Seated, supine, and core program: [id number]    Consulted and Agree with Plan of Care Patient             Patient will benefit from skilled therapeutic intervention in order to improve the following deficits and impairments:  Abnormal gait, Decreased mobility, Decreased balance, Decreased coordination, Difficulty walking, Decreased strength, Impaired tone, Postural dysfunction, Improper body mechanics  Visit Diagnosis: Unsteadiness on feet  Muscle weakness (generalized)  Difficulty in walking, not elsewhere classified     Problem List There are no problems to display for this patient.  Jendaya Gossett PT, DPT, GCS  Jeriko Kowalke 05/01/2021, 12:45 PM  [facility] [address] [place], [place], [place] Phone: [phone]   Fax:  [phone]  Name: Michelle Randall MRN: Julaine Hua Date of Birth: 1996/05/04

## 2021-05-14 ENCOUNTER — Other Ambulatory Visit: Payer: Self-pay

## 2021-05-14 ENCOUNTER — Ambulatory Visit: Payer: Medicaid Other | Admitting: Physical Therapy

## 2021-05-14 DIAGNOSIS — R2681 Unsteadiness on feet: Secondary | ICD-10-CM

## 2021-05-14 DIAGNOSIS — M6281 Muscle weakness (generalized): Secondary | ICD-10-CM

## 2021-05-14 DIAGNOSIS — R2689 Other abnormalities of gait and mobility: Secondary | ICD-10-CM

## 2021-05-14 DIAGNOSIS — R262 Difficulty in walking, not elsewhere classified: Secondary | ICD-10-CM

## 2021-05-14 NOTE — Therapy (Signed)
[facility] [address] [place], [place], [address] Phone: [phone]   Fax:  [phone]  Physical Therapy Treatment  Patient Details  Name: Michelle Randall MRN: [id number] Date of Birth: 03/01/1996 Referring Provider (PT): Carren Rang PA   Encounter Date: 05/14/2021   PT End of Session - 05/14/21 1708     Visit Number 12    Number of Visits 13    Date for PT Re-Evaluation 05/25/21    Authorization Type eval: 12/11/20, recert: 03/02/21    Authorization Time Period 12 visits 01/22/21-07/08/21    Authorization - Visit Number 8    Authorization - Number of Visits 12    PT Start Time 1700    PT Stop Time 1745    PT Time Calculation (min) 45 min    Equipment Utilized During Treatment Gait belt;Other (comment)   Bilateral AFOs   Activity Tolerance Patient tolerated treatment well    Behavior During Therapy WFL for tasks assessed/performed             Past Medical History:  Diagnosis Date   Neurogenic bladder    Neurogenic bowel    S/P VP shunt    Spina bifida [facility])     Past Surgical History:  Procedure Laterality Date   VENTRICULOPERITONEAL SHUNT      There were no vitals filed for this visit.   Subjective Assessment - 05/14/21 1707     Subjective Pt reports that she is doing well today. She denies any pain upon arrival. No updates since last therapy session. No specific questions or concerns currently.    Pertinent History Patient is a pleasant 25 year old female who presents for continuation of care for generalized weakness/ coordination deficits secondary to diagnosis of spina bifida. PMH includes VP shunt, spina bifida, and neurogenic bowel/bladder. Patient was discharged from this clinic at the end of 2021 due to meeting therapy cap for insurance that year. Patient reports that she has continued performing her HEP but notes a decline when she stops therapy due to inability to safely perform all of the balance  and strength exercises as at home. One of the biggest challenge she is experiencing is stair navigation  She reports having multiple near falls when negotiating stairs at home. Patient must ascend/descend stairs to reach bedroom upstairs increasing her risk for falls daily.    Limitations Walking;Standing;House hold activities;Other (comment)   Stairs   How long can you sit comfortably? n/a    How long can you stand comfortably? require use of bilateral lofstrand crutches    How long can you walk comfortably? dependent upon terrain    Patient Stated Goals Patient wants to be able to negotiate uneven terrain, cross thresholds in home environment, and negotiate steps in household    Currently in Pain? No/denies               TREATMENT     Ther-ex  NuStep L2-4 x 5 minutes (upper and lower extremities used) during history for warm-up (unbilled) Total Gym L22 BLE squats 2 x 20, (AFOs donned); Total Gym L22 double leg jumps 2 x 10 BLE (AFOs donned); Total Gym L22 single leg squats 2 x 10 BLE (AFOs donned); Mini squats in // bars with BUE support 2 x 15; 6" alternating step-ups with BUE support x 10 BLE; Lateral steps at // bars with yellow RB 2 x 12 steps;     Neuromuscular Re-education  Static standing on airex pad  w/ occasional UE support 2 x 1 minute; A-P weight shifting on airex pad x 20 each direction;     Pt educated throughout session about proper posture and technique with exercises. Improved exercise technique, movement at target joints, use of target muscles after min to mod verbal, visual, tactile cues.       Pt demonstrates excellent motivation during session today. Repeated Total Gym single and double leg squats as well as jumps at the most difficult setting on the Total Gym. She performed increased sets of squats and resisted side steps with yellow RB were introduced. Occasional UE support required during during static standing on airex pad. Pt reported mild fatigue upon  completion of session. Pt requires physical therapy intervention to maintain her functional status including LE strength, balance, and gait speed/quality and she has not achieved maximal benefit from therapy services. She will benefit from continued PT services to address these deficits and maintain her function at home and school.        PT Short Term Goals - 04/16/21 2150       PT SHORT TERM GOAL #1   Title Patient will report compliance with HEP for continued strengthening and stability during functional mobility.     Time 6    Period Weeks    Status Achieved               PT Long Term Goals - 04/16/21 2150       PT LONG TERM GOAL #1   Title Patient will increase her FOTO score to at least 59 to demonstrate increased function and mobility for higher quality of life.    Baseline 12/11/20: 51; 03/02/21: 49    Time 12    Period Weeks    Status On-going    Target Date 05/25/21      PT LONG TERM GOAL #2   Title Patient will maintain Berg Balance score of at least 32/56 in order to demonstrate decreased fall risk during functional activities.    Baseline 12/11/20: 32/56; 03/02/21: 32/56    Time 12    Period Weeks    Status On-going    Target Date 05/25/21      PT LONG TERM GOAL #3   Title Patient will be able to maintain safe performance with ascending/descending a flight of stairs using lofstrand crutches and railing in order to allow for safe navigation of her home environment.    Baseline 12/11/20: currently able to use lofstrands and/or railing with supervision by family; 03/02/21: Good maintenance observed    Time 12    Period Weeks    Status On-going    Target Date 05/25/21      PT LONG TERM GOAL #4   Title Patient will complete a TUG test in no more than 13.5 seconds for independent mobility and decreased fall risk maintenance    Baseline 12/11/20: 13.4s; 03/02/21: 13.5s    Time 12    Period Weeks    Status On-going    Target Date 05/25/21      PT LONG TERM GOAL  #5   Title Patient will increase fastest 10 meter walk test to >0.81 m/s as to improve gait speed for better community ambulation and to reduce fall risk.    Baseline 12/11/20: 14.6s = 0.68 m/s; 03/02/21: 14.3s = 0.70 m/s    Time 12    Period Weeks    Status On-going    Target Date 05/25/21  Plan - 05/14/21 1752     Clinical Impression Statement Pt demonstrates excellent motivation during session today. Repeated Total Gym single and double leg squats as well as jumps at the most difficult setting on the Total Gym. She performed increased sets of squats and resisted side steps with yellow RB were introduced. Occasional UE support required during during static standing on airex pad. Pt reported mild fatigue upon completion of session. Pt requires physical therapy intervention to maintain her functional status including LE strength, balance, and gait speed/quality and she has not achieved maximal benefit from therapy services. She will benefit from continued PT services to address these deficits and maintain her function at home and school.    Personal Factors and Comorbidities Comorbidity 1;Past/Current Experience;Time since onset of injury/illness/exacerbation;Transportation;Social Background    Comorbidities spina bifida    Examination-Activity Limitations Bend;Carry;Continence;Lift;Reach Overhead;Squat;Stairs;Stand;Transfers    Examination-Participation Restrictions Church;Community Activity;Driving;Laundry;Meal Prep;Volunteer;Yard Work    Stability/Clinical Decision Making Stable/Uncomplicated    Rehab Potential Good    Clinical Impairments Affecting Rehab Potential weakness and decreased standing balance    PT Frequency Biweekly    PT Duration 12 weeks    PT Treatment/Interventions ADLs/Self Care Home Management;Aquatic Therapy;Biofeedback;Ultrasound;Moist Heat;Iontophoresis 4mg /ml Dexamethasone;Electrical Stimulation;DME Instruction;Gait training;Stair  training;Functional mobility training;Neuromuscular re-education;Balance training;Therapeutic exercise;Therapeutic activities;Patient/family education;Orthotic Fit/Training;Wheelchair mobility training;Manual techniques;Dry needling;Passive range of motion;Energy conservation;Splinting;Taping;Vestibular;Visual/perceptual remediation/compensation;Canalith Repostioning;Cryotherapy    PT Next Visit Plan review HEP, balance and strength exercises    PT Home Exercise Plan Seated, supine, and core program: [id number]    Consulted and Agree with Plan of Care Patient             Patient will benefit from skilled therapeutic intervention in order to improve the following deficits and impairments:  Abnormal gait, Decreased mobility, Decreased balance, Decreased coordination, Difficulty walking, Decreased strength, Impaired tone, Postural dysfunction, Improper body mechanics  Visit Diagnosis: Unsteadiness on feet  Muscle weakness (generalized)  Difficulty in walking, not elsewhere classified  Other abnormalities of gait and mobility     Problem List There are no problems to display for this patient.  PT, DPT  Basilia Jumbo 05/14/2021, 5:54 PM  [facility] [address] [place], [place], [place] Phone: [phone]   Fax:  [phone]  Name: Michelle Randall MRN: Julaine Hua Date of Birth: [birth year]

## 2021-05-28 ENCOUNTER — Other Ambulatory Visit: Payer: Self-pay

## 2021-05-28 ENCOUNTER — Ambulatory Visit: Payer: Medicaid Other | Attending: Student

## 2021-05-28 DIAGNOSIS — M6281 Muscle weakness (generalized): Secondary | ICD-10-CM

## 2021-05-28 DIAGNOSIS — R2681 Unsteadiness on feet: Secondary | ICD-10-CM

## 2021-05-28 NOTE — Therapy (Signed)
[facility] [address] [place], [place], [address] Phone: [phone]   Fax:  [phone]  Physical Therapy Treatment/Recertification  Patient Details  Name: Michelle Randall MRN: [id number] Date of Birth: 11-Aug-1996 Referring Provider (PT): Carren Rang PA   Encounter Date: 05/28/2021   PT End of Session - 05/29/21 1128     Visit Number 13    Number of Visits 25    Date for PT Re-Evaluation 08/20/21    Authorization Type eval: 12/11/20, recert: 03/02/21    Authorization Time Period 12 visits 01/22/21-07/08/21    Authorization - Visit Number 8    Authorization - Number of Visits 12    PT Start Time 1703    PT Stop Time 1748    PT Time Calculation (min) 45 min    Equipment Utilized During Treatment Gait belt;Other (comment)   Bilateral AFOs   Activity Tolerance Patient tolerated treatment well    Behavior During Therapy WFL for tasks assessed/performed              Past Medical History:  Diagnosis Date   Neurogenic bladder    Neurogenic bowel    S/P VP shunt    Spina bifida [facility])     Past Surgical History:  Procedure Laterality Date   VENTRICULOPERITONEAL SHUNT      There were no vitals filed for this visit.   Subjective Assessment - 05/28/21 1713     Subjective Pt reports that she is doing well today. She denies any pain upon arrival. No updates since last therapy session. Chair lift is still working well for her at home. No specific questions or concerns currently.    Pertinent History Patient is a pleasant 25 year old female who presents for continuation of care for generalized weakness/ coordination deficits secondary to diagnosis of spina bifida. PMH includes VP shunt, spina bifida, and neurogenic bowel/bladder. Patient was discharged from this clinic at the end of 2021 due to meeting therapy cap for insurance that year. Patient reports that she has continued performing her HEP but notes a decline when she  stops therapy due to inability to safely perform all of the balance and strength exercises as at home. One of the biggest challenge she is experiencing is stair navigation  She reports having multiple near falls when negotiating stairs at home. Patient must ascend/descend stairs to reach bedroom upstairs increasing her risk for falls daily.    Limitations Walking;Standing;House hold activities;Other (comment)   Stairs   How long can you sit comfortably? n/a    How long can you stand comfortably? require use of bilateral lofstrand crutches    How long can you walk comfortably? dependent upon terrain    Patient Stated Goals Patient wants to be able to negotiate uneven terrain, cross thresholds in home environment, and negotiate steps in household               TREATMENT     Ther-ex  NuStep L2-4 x 5 minutes, LE only during history for warm-up (unbilled) Total Gym L22 BLE squats 2 x 20, (AFOs donned); Total Gym L22 double leg jumps 2 x 10 BLE (AFOs donned); Total Gym L22 single leg squats 2 x 10 BLE (AFOs donned); Mini squats in // bars with BUE support 2 x 15; 6" alternating step-ups with BUE support x 10 BLE; Forward lunges alternating leading LE x 10 each;     Neuromuscular Re-education  All exercises performed without UE support unless otherwise  noted; Feet apart static balance eyes open/closed x 30s each; Feet apart balance with horizontal and vertical head turns x 30s each; Feet together static balance eyes open/closed x 30s each; Feet together balance with horizontal and vertical head turns x 30s each; Static standing on airex pad with feet apart/together x 30s each;     Pt educated throughout session about proper posture and technique with exercises. Improved exercise technique, movement at target joints, use of target muscles after min to mod verbal, visual, tactile cues.       Pt demonstrates excellent motivation during session today. Repeated Total Gym single and double  leg squats as well as jumps at the most difficult setting on the Total Gym. Continued with standing exercises in parallel bars as well as balance training a variety of conditions. Pt requires physical therapy intervention to maintain her functional status including LE strength, balance, and gait speed/quality and she has not achieved maximal benefit from therapy services. She is unable to complete exercises safely at home either independently or with family assistance due to need from physical therapist to monitor and correct form and technique as well as  to provide safe guarding. She will benefit from continued PT services to address these deficits and maintain her function at home and school.        PT Short Term Goals - 05/29/21 1355       PT SHORT TERM GOAL #1   Title Patient will report compliance with HEP for continued strengthening and stability during functional mobility.     Time 6    Period Weeks    Status Achieved               PT Long Term Goals - 05/29/21 1355       PT LONG TERM GOAL #1   Title Patient will increase her FOTO score to at least 59 to demonstrate increased function and mobility for higher quality of life.    Baseline 12/11/20: 51; 03/02/21: 49    Time 12    Period Weeks    Status On-going    Target Date 08/20/21      PT LONG TERM GOAL #2   Title Patient will maintain Berg Balance score of at least 32/56 in order to demonstrate decreased fall risk during functional activities.    Baseline 12/11/20: 32/56; 03/02/21: 32/56    Time 12    Period Weeks    Status On-going    Target Date 08/20/21      PT LONG TERM GOAL #3   Title Patient will be able to maintain safe performance with ascending/descending a flight of stairs using lofstrand crutches and railing in order to allow for safe navigation of her home environment.    Baseline 12/11/20: currently able to use lofstrands and/or railing with supervision by family; 03/02/21: Good maintenance observed     Time 12    Period Weeks    Status On-going    Target Date 08/20/21      PT LONG TERM GOAL #4   Title Patient will complete a TUG test in no more than 13.5 seconds for independent mobility and decreased fall risk maintenance    Baseline 12/11/20: 13.4s; 03/02/21: 13.5s    Time 12    Period Weeks    Status On-going    Target Date 08/20/21      PT LONG TERM GOAL #5   Title Patient will increase fastest 10 meter walk test to >0.81 m/s as to improve  gait speed for better community ambulation and to reduce fall risk.    Baseline 12/11/20: 14.6s = 0.68 m/s; 03/02/21: 14.3s = 0.70 m/s    Time 12    Period Weeks    Status On-going    Target Date 08/20/21                   Plan - 05/29/21 1354     Clinical Impression Statement Pt demonstrates excellent motivation during session today. Repeated Total Gym single and double leg squats as well as jumps at the most difficult setting on the Total Gym. Continued with standing exercises in parallel bars as well as balance training a variety of conditions. Pt requires physical therapy intervention to maintain her functional status including LE strength, balance, and gait speed/quality and she has not achieved maximal benefit from therapy services. She is unable to complete exercises safely at home either independently or with family assistance due to need from physical therapist to monitor and correct form and technique as well as  to provide safe guarding. She will benefit from continued PT services to address these deficits and maintain her function at home and school.    Personal Factors and Comorbidities Comorbidity 1;Past/Current Experience;Time since onset of injury/illness/exacerbation;Transportation;Social Background    Comorbidities spina bifida    Examination-Activity Limitations Bend;Carry;Continence;Lift;Reach Overhead;Squat;Stairs;Stand;Transfers    Examination-Participation Restrictions Church;Community Activity;Driving;Laundry;Meal  Prep;Volunteer;Yard Work    Stability/Clinical Decision Making Stable/Uncomplicated    Rehab Potential Good    Clinical Impairments Affecting Rehab Potential weakness and decreased standing balance    PT Frequency Biweekly    PT Duration 12 weeks    PT Treatment/Interventions ADLs/Self Care Home Management;Aquatic Therapy;Biofeedback;Ultrasound;Moist Heat;Iontophoresis 4mg /ml Dexamethasone;Electrical Stimulation;DME Instruction;Gait training;Stair training;Functional mobility training;Neuromuscular re-education;Balance training;Therapeutic exercise;Therapeutic activities;Patient/family education;Orthotic Fit/Training;Wheelchair mobility training;Manual techniques;Dry needling;Passive range of motion;Energy conservation;Splinting;Taping;Vestibular;Visual/perceptual remediation/compensation;Canalith Repostioning;Cryotherapy    PT Next Visit Plan review HEP, balance and strength exercises    PT Home Exercise Plan Seated, supine, and core program: [id number]    Consulted and Agree with Plan of Care Patient              Patient will benefit from skilled therapeutic intervention in order to improve the following deficits and impairments:  Abnormal gait, Decreased mobility, Decreased balance, Decreased coordination, Difficulty walking, Decreased strength, Impaired tone, Postural dysfunction, Improper body mechanics  Visit Diagnosis: Unsteadiness on feet  Muscle weakness (generalized)     Problem List There are no problems to display for this patient.   Clydette Privitera PT, DPT, GCS  Keian Odriscoll 05/29/2021, 2:02 PM  [place] [facility] [address] [place], [place], [place] Phone: [phone]   Fax:  [phone]  Name: SAUNDRA GIN MRN: Julaine Hua Date of Birth: [birth year]

## 2021-06-11 ENCOUNTER — Ambulatory Visit: Payer: Medicaid Other

## 2021-06-11 ENCOUNTER — Other Ambulatory Visit: Payer: Self-pay

## 2021-06-11 DIAGNOSIS — R2681 Unsteadiness on feet: Secondary | ICD-10-CM

## 2021-06-11 DIAGNOSIS — M6281 Muscle weakness (generalized): Secondary | ICD-10-CM

## 2021-06-11 NOTE — Therapy (Signed)
[facility] [address] [place], [place], [address] Phone: [phone]   Fax:  [phone]  Physical Therapy Treatment/Goal Update   Patient Details  Name: Michelle Randall MRN: [id number] Date of Birth: 12/02/1995 Referring Provider (PT): Wayland Denis PA   Encounter Date: 06/11/2021   PT End of Session - 06/11/21 1711     Visit Number 14    Number of Visits 25    Date for PT Re-Evaluation 08/20/21    Authorization Type eval: 03/13/75, recert: 06/25/56    Authorization Time Period 12 visits 01/22/21-07/08/21    Authorization - Visit Number 11    Authorization - Number of Visits 12    PT Start Time 2620    PT Stop Time 1750    PT Time Calculation (min) 44 min    Equipment Utilized During Treatment Gait belt;Other (comment)   Bilateral AFOs   Activity Tolerance Patient tolerated treatment well    Behavior During Therapy WFL for tasks assessed/performed              Past Medical History:  Diagnosis Date   Neurogenic bladder    Neurogenic bowel    S/P VP shunt    Spina bifida ([place])     Past Surgical History:  Procedure Laterality Date   VENTRICULOPERITONEAL SHUNT      Vitals:     Subjective Assessment - 06/11/21 1710     Subjective Pt reports that she is doing well today. She denies any pain upon arrival. No updates since last therapy session. No recent falls. No specific questions or concerns currently.    Pertinent History Patient is a pleasant 25 year old female who presents for continuation of care for generalized weakness/ coordination deficits secondary to diagnosis of spina bifida. PMH includes VP shunt, spina bifida, and neurogenic bowel/bladder. Patient was discharged from this clinic at the end of 2021 due to meeting therapy cap for insurance that year. Patient reports that she has continued performing her HEP but notes a decline when she stops therapy due to inability to safely perform all of the balance  and strength exercises as at home. One of the biggest challenge she is experiencing is stair navigation  She reports having multiple near falls when negotiating stairs at home. Patient must ascend/descend stairs to reach bedroom upstairs increasing her risk for falls daily.    Limitations Walking;Standing;House hold activities;Other (comment)   Stairs   How long can you sit comfortably? n/a    How long can you stand comfortably? require use of bilateral lofstrand crutches    How long can you walk comfortably? dependent upon terrain    Patient Stated Goals Patient wants to be able to negotiate uneven terrain, cross thresholds in home environment, and negotiate steps in household               TREATMENT    TREATMENT     Neuromuscular Re-education  Updated outcome measures and goals with patient today:   OUTCOME MEASURES:  TEST 12/11/20 03/02/21 06/11/21 Interpretation  5 times sit<>stand 10.6s 9.6s 8.1s WNL  10 meter walk test 14.6s = 0.68 m/s 14.3s = 0.70 m/s 13.2s =  0.76 m/s <1.0 m/s indicates increased risk for falls; limited community ambulator  Timed up and Go 13.4s 13.5s 11.4s WNL, use of lofstrand crutches, >14 sec indicates increased risk for falls  6 minute walk test Deferred Deferred Deferred 1000 feet is community ambulator  BERG Balance Assessment 32/56 32/56 33/56   <  36/56 (100% risk for falls), 37-45 (80% risk for falls); 46-51 (>50% risk for falls); 52-55 (lower risk <25% of falls)  FOTO 51 49 53 Goal is to reach 59      Ther-ex  6" alternating step-ups with BUE support x 10 on each side;  Squats with BUE support x 10; Side stepping in // bars with BUE support x 4 lengths;     Pt educated throughout session about proper posture and technique with exercises. Improved exercise technique, movement at target joints, use of target muscles after min to mod verbal, visual, tactile cues.      Pt demonstrates excellent motivation during session today. Updated outcome  measures and goals during visit today. Her TUG, BERG, 16m gait speed, and FOTO showed slight improvement since they were last updated. Pt shows nice maintenance of her functional outcome measures which is consistent with her goals for therapy. Discharge attempted at the end of 2021 and she demonstrated a drop in her function during her prolonged absence out of therapy despite good consistency with her HEP. She is unable to complete all necessary exercises safely at home either independently or with family assistance due to need from physical therapist to monitor and correct form and technique as well as  to provide safe guarding. Additional therapy today focused on strengthening. Pt requires physical therapy intervention to maintain her functional status including LE strength, balance, and gait speed/quality and she has not achieved maximal benefit from therapy services. She will benefit from continued PT services to address these deficits and maintain her function at home and school.        PT Short Term Goals - 06/11/21 1714       PT SHORT TERM GOAL #1   Title Patient will report compliance with HEP for continued strengthening and stability during functional mobility.     Time 6    Period Weeks    Status Achieved               PT Long Term Goals - 06/12/21 1212       PT LONG TERM GOAL #1   Title Patient will increase her FOTO score to at least 59 to demonstrate increased function and mobility for higher quality of life.    Baseline 12/11/20: 51; 03/02/21: 49; 06/11/21: 53    Time 12    Period Weeks    Status Partially Met    Target Date 08/20/21      PT LONG TERM GOAL #2   Title Patient will maintain Berg Balance score of at least 32/56 in order to demonstrate decreased fall risk during functional activities.    Baseline 12/11/20: 32/56; 03/02/21: 32/56; 06/11/21: 33/56    Time 12    Period Weeks    Status On-going    Target Date 08/20/21      PT LONG TERM GOAL #3   Title  Patient will be able to maintain safe performance with ascending/descending a flight of stairs using lofstrand crutches and railing in order to allow for safe navigation of her home environment.    Baseline 12/11/20: currently able to use lofstrands and/or railing with supervision by family; 03/02/21: Good maintenance observed; 06/11/21: Good maintenance observed;    Time 12    Period Weeks    Status On-going    Target Date 08/20/21      PT LONG TERM GOAL #4   Title Patient will complete a TUG test in no more than 13.5 seconds for independent mobility  and decreased fall risk maintenance    Baseline 12/11/20: 13.4s; 03/02/21: 13.5s; 06/11/21: 11.4s    Time 12    Period Weeks    Status On-going    Target Date 08/20/21      PT LONG TERM GOAL #5   Title Patient will increase fastest 10 meter walk test to >0.81 m/s as to improve gait speed for better community ambulation and to reduce fall risk.    Baseline 12/11/20: 14.6s = 0.68 m/s; 03/02/21: 14.3s = 0.70 m/s; 06/11/21: 13.2s = 0.76 m/s    Time 12    Period Weeks    Status Partially Met    Target Date 08/20/21                   Plan - 06/11/21 1713     Clinical Impression Statement Pt demonstrates excellent motivation during session today. Updated outcome measures and goals during visit today. Her TUG, BERG, 75mgait speed, and FOTO showed slight improvement since they were last updated. Pt shows nice maintenance of her functional outcome measures which is consistent with her goals for therapy. Discharge attempted at the end of 2021 and she demonstrated a drop in her function during her prolonged absence out of therapy despite good consistency with her HEP. She is unable to complete all necessary exercises safely at home either independently or with family assistance due to need from physical therapist to monitor and correct form and technique as well as  to provide safe guarding. Additional therapy today focused on strengthening. Pt requires  physical therapy intervention to maintain her functional status including LE strength, balance, and gait speed/quality and she has not achieved maximal benefit from therapy services. She will benefit from continued PT services to address these deficits and maintain her function at home and school.    Personal Factors and Comorbidities Comorbidity 1;Past/Current Experience;Time since onset of injury/illness/exacerbation;Transportation;Social Background    Comorbidities spina bifida    Examination-Activity Limitations Bend;Carry;Continence;Lift;Reach Overhead;Squat;Stairs;Stand;Transfers    Examination-Participation Restrictions Church;Community Activity;Driving;Laundry;Meal Prep;Volunteer;Yard Work    Stability/Clinical Decision Making Stable/Uncomplicated    Rehab Potential Good    Clinical Impairments Affecting Rehab Potential weakness and decreased standing balance    PT Frequency Biweekly    PT Duration 12 weeks    PT Treatment/Interventions ADLs/Self Care Home Management;Aquatic Therapy;Biofeedback;Ultrasound;Moist Heat;Iontophoresis 463mml Dexamethasone;Electrical Stimulation;DME Instruction;Gait training;Stair training;Functional mobility training;Neuromuscular re-education;Balance training;Therapeutic exercise;Therapeutic activities;Patient/family education;Orthotic Fit/Training;Wheelchair mobility training;Manual techniques;Dry needling;Passive range of motion;Energy conservation;Splinting;Taping;Vestibular;Visual/perceptual remediation/compensation;Canalith Repostioning;Cryotherapy    PT Next Visit Plan review HEP, balance and strength exercises    PT Home Exercise Plan Seated, supine, and core program: [id number]    Consulted and Agree with Plan of Care Patient              Patient will benefit from skilled therapeutic intervention in order to improve the following deficits and impairments:  Abnormal gait, Decreased mobility, Decreased balance, Decreased coordination, Difficulty  walking, Decreased strength, Impaired tone, Postural dysfunction, Improper body mechanics  Visit Diagnosis: Unsteadiness on feet  Muscle weakness (generalized)     Problem List There are no problems to display for this patient.   JaLyndel Safeuprich PT, DPT, GCS  Winnell Bento 06/12/2021, 1:16 PM  [place] [id number]: [phone] Fax:  [phone]: MiBRITTLYN CLOERN: [id number] of Birth: 07/26/00/1997

## 2021-06-25 ENCOUNTER — Ambulatory Visit: Payer: Medicaid Other | Attending: Student

## 2021-06-25 ENCOUNTER — Other Ambulatory Visit: Payer: Self-pay

## 2021-06-25 DIAGNOSIS — M6281 Muscle weakness (generalized): Secondary | ICD-10-CM | POA: Diagnosis present

## 2021-06-25 DIAGNOSIS — R2681 Unsteadiness on feet: Secondary | ICD-10-CM | POA: Diagnosis present

## 2021-06-26 NOTE — Therapy (Signed)
[place] [address] [place], [place], [address] Phone: [phone]   Fax:  [phone]  Physical Therapy Treatment   Patient Details  Name: Michelle Randall MRN: [id number] Date of Birth: [birth year] Referring Provider (PT): Wayland Denis PA   Encounter Date: 06/25/2021   PT End of Session - 06/25/21 1706     Visit Number 15    Number of Visits 25    Date for PT Re-Evaluation 08/20/21    Authorization Type eval: 9/83/38, recert: 2/50/53    Authorization Time Period 12 visits 01/22/21-07/08/21    Authorization - Visit Number 12    Authorization - Number of Visits 12    PT Start Time 9767    PT Stop Time 3419    PT Time Calculation (min) 42 min    Equipment Utilized During Treatment Gait belt;Other (comment)   Bilateral AFOs   Activity Tolerance Patient tolerated treatment well    Behavior During Therapy WFL for tasks assessed/performed              Past Medical History:  Diagnosis Date   Neurogenic bladder    Neurogenic bowel    S/P VP shunt    Spina bifida [facility])     Past Surgical History:  Procedure Laterality Date   VENTRICULOPERITONEAL SHUNT      There were no vitals filed for this visit.    Subjective Assessment - 06/25/21 1706     Subjective Pt reports that she is doing well today. She denies any pain upon arrival. No updates since last therapy session. No recent falls. No specific questions or concerns currently.    Pertinent History Patient is a pleasant 25 year old female who presents for continuation of care for generalized weakness/ coordination deficits secondary to diagnosis of spina bifida. PMH includes VP shunt, spina bifida, and neurogenic bowel/bladder. Patient was discharged from this clinic at the end of 2021 due to meeting therapy cap for insurance that year. Patient reports that she has continued performing her HEP but notes a decline when she stops therapy due to inability to safely  perform all of the balance and strength exercises as at home. One of the biggest challenge she is experiencing is stair navigation  She reports having multiple near falls when negotiating stairs at home. Patient must ascend/descend stairs to reach bedroom upstairs increasing her risk for falls daily.    Limitations Walking;Standing;House hold activities;Other (comment)   Stairs   How long can you sit comfortably? n/a    How long can you stand comfortably? require use of bilateral lofstrand crutches    How long can you walk comfortably? dependent upon terrain    Patient Stated Goals Patient wants to be able to negotiate uneven terrain, cross thresholds in home environment, and negotiate steps in household               TREATMENT    TREATMENT     Ther-ex  Total Gym L22 BLE squats 2 x 20, (AFOs donned); Total Gym L22 double leg jumps 2 x 10 BLE (AFOs donned); Total Gym L22 single leg squats 2 x 10 BLE (AFOs donned); 6" alternating step-ups with BUE support on stair case x 10 BLE;  Side stepping in // bars with BUE support x 4 lengths; Forward BOSU lunges (rounds side up), alternating forward LE x 10 each;    Neuromuscular Re-education  All exercises performed without UE support unless otherwise noted; Feet apart static balance eyes  open/closed x 30s each; Feet apart balance with horizontal and vertical head turns x 30s each; Feet together static balance eyes open/closed x 30s each; Feet together balance with horizontal and vertical head turns x 30s each; Static standing on Airex pad with feet apart/together x 30s each;  Static standing on Airex pad with feed together and eyes closed x 30s; Semitandem balance alternating forward LE x 30s each;     Pt educated throughout session about proper posture and technique with exercises. Improved exercise technique, movement at target joints, use of target muscles after min to mod verbal, visual, tactile cues.      Pt demonstrates  excellent motivation during session today. Returned to strengthening on the [employer]. Also practiced steps on the staircase during session. Balance exercises performed with patient in the parallel bars. Discharge attempted at the end of 2021 and she demonstrated a drop in her function during her prolonged absence out of therapy despite good consistency with her HEP. She is unable to complete all necessary exercises safely at home either independently or with family assistance due to need from physical therapist to monitor and correct form and technique as well as  to provide safe guarding. Pt requires physical therapy intervention to maintain her functional status including LE strength, balance, and gait speed/quality and she has not achieved maximal benefit from therapy services. She will benefit from continued PT services to address these deficits and maintain her function at home and school.        PT Short Term Goals - 06/11/21 1714       PT SHORT TERM GOAL #1   Title Patient will report compliance with HEP for continued strengthening and stability during functional mobility.     Time 6    Period Weeks    Status Achieved               PT Long Term Goals - 06/12/21 1212       PT LONG TERM GOAL #1   Title Patient will increase her FOTO score to at least 59 to demonstrate increased function and mobility for higher quality of life.    Baseline 12/11/20: 51; 03/02/21: 49; 06/11/21: 53    Time 12    Period Weeks    Status Partially Met    Target Date 08/20/21      PT LONG TERM GOAL #2   Title Patient will maintain Berg Balance score of at least 32/56 in order to demonstrate decreased fall risk during functional activities.    Baseline 12/11/20: 32/56; 03/02/21: 32/56; 06/11/21: 33/56    Time 12    Period Weeks    Status On-going    Target Date 08/20/21      PT LONG TERM GOAL #3   Title Patient will be able to maintain safe performance with ascending/descending a flight of stairs  using lofstrand crutches and railing in order to allow for safe navigation of her home environment.    Baseline 12/11/20: currently able to use lofstrands and/or railing with supervision by family; 03/02/21: Good maintenance observed; 06/11/21: Good maintenance observed;    Time 12    Period Weeks    Status On-going    Target Date 08/20/21      PT LONG TERM GOAL #4   Title Patient will complete a TUG test in no more than 13.5 seconds for independent mobility and decreased fall risk maintenance    Baseline 12/11/20: 13.4s; 03/02/21: 13.5s; 06/11/21: 11.4s    Time 12  Period Weeks    Status On-going    Target Date 08/20/21      PT LONG TERM GOAL #5   Title Patient will increase fastest 10 meter walk test to >0.81 m/s as to improve gait speed for better community ambulation and to reduce fall risk.    Baseline 12/11/20: 14.6s = 0.68 m/s; 03/02/21: 14.3s = 0.70 m/s; 06/11/21: 13.2s = 0.76 m/s    Time 12    Period Weeks    Status Partially Met    Target Date 08/20/21                   Plan - 06/25/21 1706     Clinical Impression Statement Pt demonstrates excellent motivation during session today. Returned to strengthening on the [employer]. Also practiced steps on the staircase during session. Balance exercises performed with patient in the parallel bars. Discharge attempted at the end of 2021 and she demonstrated a drop in her function during her prolonged absence out of therapy despite good consistency with her HEP. She is unable to complete all necessary exercises safely at home either independently or with family assistance due to need from physical therapist to monitor and correct form and technique as well as  to provide safe guarding. Pt requires physical therapy intervention to maintain her functional status including LE strength, balance, and gait speed/quality and she has not achieved maximal benefit from therapy services. She will benefit from continued PT services to address these  deficits and maintain her function at home and school.    Personal Factors and Comorbidities Comorbidity 1;Past/Current Experience;Time since onset of injury/illness/exacerbation;Transportation;Social Background    Comorbidities spina bifida    Examination-Activity Limitations Bend;Carry;Continence;Lift;Reach Overhead;Squat;Stairs;Stand;Transfers    Examination-Participation Restrictions Church;Community Activity;Driving;Laundry;Meal Prep;Volunteer;Yard Work    Stability/Clinical Decision Making Stable/Uncomplicated    Rehab Potential Good    Clinical Impairments Affecting Rehab Potential weakness and decreased standing balance    PT Frequency Biweekly    PT Duration 12 weeks    PT Treatment/Interventions ADLs/Self Care Home Management;Aquatic Therapy;Biofeedback;Ultrasound;Moist Heat;Iontophoresis 106m/ml Dexamethasone;Electrical Stimulation;DME Instruction;Gait training;Stair training;Functional mobility training;Neuromuscular re-education;Balance training;Therapeutic exercise;Therapeutic activities;Patient/family education;Orthotic Fit/Training;Wheelchair mobility training;Manual techniques;Dry needling;Passive range of motion;Energy conservation;Splinting;Taping;Vestibular;Visual/perceptual remediation/compensation;Canalith Repostioning;Cryotherapy    PT Next Visit Plan review HEP, balance and strength exercises    PT Home Exercise Plan Seated, supine, and core program: [id number]    Consulted and Agree with Plan of Care Patient              Patient will benefit from skilled therapeutic intervention in order to improve the following deficits and impairments:  Abnormal gait, Decreased mobility, Decreased balance, Decreased coordination, Difficulty walking, Decreased strength, Impaired tone, Postural dysfunction, Improper body mechanics  Visit Diagnosis: Unsteadiness on feet     Problem List There are no problems to display for this patient.   JLyndel SafeHuprich PT, DPT, GCS   Nuri Branca 06/26/2021, 2:07 PM  [place] [facility] [place] [address]Phone: [phone]  Fax:  [phone] Name: Michelle SAMADMRN: [id number] of Birth: 9Apr 09, 1997

## 2021-07-09 ENCOUNTER — Ambulatory Visit: Payer: Medicaid Other

## 2021-07-09 ENCOUNTER — Other Ambulatory Visit: Payer: Self-pay

## 2021-07-09 DIAGNOSIS — R2681 Unsteadiness on feet: Secondary | ICD-10-CM | POA: Diagnosis not present

## 2021-07-09 DIAGNOSIS — M6281 Muscle weakness (generalized): Secondary | ICD-10-CM

## 2021-07-09 NOTE — Therapy (Signed)
[facility] [address] [place], [place], [address] Phone: [phone]   Fax:  [phone]  Physical Therapy Treatment   Patient Details  Name: Michelle Randall MRN: [id number] Date of Birth: [birth year] Referring Provider (PT): Wayland Denis PA   Encounter Date: 07/09/2021   PT End of Session - 07/10/21 0920     Visit Number 16    Number of Visits 25    Date for PT Re-Evaluation 08/20/21    Authorization Type eval: 0/17/79, recert: 3/90/30, 0/92/33    Authorization Time Period 6 visits 8/25-11/16/22    Authorization - Visit Number 1    Authorization - Number of Visits 6    PT Start Time 1700    PT Stop Time 0076    PT Time Calculation (min) 45 min    Equipment Utilized During Treatment Gait belt;Other (comment)   Bilateral AFOs   Activity Tolerance Patient tolerated treatment well    Behavior During Therapy WFL for tasks assessed/performed               Past Medical History:  Diagnosis Date   Neurogenic bladder    Neurogenic bowel    S/P VP shunt    Spina bifida [facility])     Past Surgical History:  Procedure Laterality Date   VENTRICULOPERITONEAL SHUNT      There were no vitals filed for this visit.    Subjective Assessment - 07/10/21 0919     Subjective Pt reports that she is doing well today. She denies any pain upon arrival. No updates since last therapy session. No recent falls. No specific questions or concerns currently.    Pertinent History Patient is a pleasant 25 year old female who presents for continuation of care for generalized weakness/ coordination deficits secondary to diagnosis of spina bifida. PMH includes VP shunt, spina bifida, and neurogenic bowel/bladder. Patient was discharged from this clinic at the end of 2021 due to meeting therapy cap for insurance that year. Patient reports that she has continued performing her HEP but notes a decline when she stops therapy due to inability to safely  perform all of the balance and strength exercises as at home. One of the biggest challenge she is experiencing is stair navigation  She reports having multiple near falls when negotiating stairs at home. Patient must ascend/descend stairs to reach bedroom upstairs increasing her risk for falls daily.    Limitations Walking;Standing;House hold activities;Other (comment)   Stairs   How long can you sit comfortably? n/a    How long can you stand comfortably? require use of bilateral lofstrand crutches    How long can you walk comfortably? dependent upon terrain    Patient Stated Goals Patient wants to be able to negotiate uneven terrain, cross thresholds in home environment, and negotiate steps in household                 TREATMENT    Ther-ex  Total Gym L22 BLE squats 2 x 20, (AFOs donned); Total Gym L22 double leg jumps 2 x 10 BLE (AFOs donned); Total Gym L22 single leg squats 2 x 10 BLE (AFOs donned); Nautilus 20# resisted gait, forward x 5, backwards x 3; 6" alternating forward step-ups with BUE support x 10 BLE;  6" alternating lateral step-ups with BUE support x 10 BLE;  Forward lunges alternating forward LE x 10 on each side;    Neuromuscular Re-education  All exercises performed without UE support unless otherwise noted;  Airex feet apart static balance eyes open/closed x 30s each; Airex feet apart balance with horizontal and vertical head turns x 30s each; Airex feet together static balance eyes open/closed x 30s each; Airex feet together balance with horizontal and vertical head turns x 30s each; Airex semitandem balance alternating forward LE x 30s each;     Pt educated throughout session about proper posture and technique with exercises. Improved exercise technique, movement at target joints, use of target muscles after min to mod verbal, visual, tactile cues.      Pt demonstrates excellent motivation during session today. Continued with strengthening on the Total Gym  today and returned to resisted gait with the Nautilus machine. Continued practicing forward and lateral step-ups as this is the biggest functional challenge for patient outside of therapy. Pt encouraged to continue HEP. She requires physical therapy intervention to maintain her functional status including LE strength, balance, and gait speed/quality and she has not achieved maximal benefit from therapy services. She will benefit from continued PT services to address these deficits and maintain her function at home and school.        PT Short Term Goals - 06/11/21 1714       PT SHORT TERM GOAL #1   Title Patient will report compliance with HEP for continued strengthening and stability during functional mobility.     Time 6    Period Weeks    Status Achieved               PT Long Term Goals - 06/12/21 1212       PT LONG TERM GOAL #1   Title Patient will increase her FOTO score to at least 59 to demonstrate increased function and mobility for higher quality of life.    Baseline 12/11/20: 51; 03/02/21: 49; 06/11/21: 53    Time 12    Period Weeks    Status Partially Met    Target Date 08/20/21      PT LONG TERM GOAL #2   Title Patient will maintain Berg Balance score of at least 32/56 in order to demonstrate decreased fall risk during functional activities.    Baseline 12/11/20: 32/56; 03/02/21: 32/56; 06/11/21: 33/56    Time 12    Period Weeks    Status On-going    Target Date 08/20/21      PT LONG TERM GOAL #3   Title Patient will be able to maintain safe performance with ascending/descending a flight of stairs using lofstrand crutches and railing in order to allow for safe navigation of her home environment.    Baseline 12/11/20: currently able to use lofstrands and/or railing with supervision by family; 03/02/21: Good maintenance observed; 06/11/21: Good maintenance observed;    Time 12    Period Weeks    Status On-going    Target Date 08/20/21      PT LONG TERM GOAL #4    Title Patient will complete a TUG test in no more than 13.5 seconds for independent mobility and decreased fall risk maintenance    Baseline 12/11/20: 13.4s; 03/02/21: 13.5s; 06/11/21: 11.4s    Time 12    Period Weeks    Status On-going    Target Date 08/20/21      PT LONG TERM GOAL #5   Title Patient will increase fastest 10 meter walk test to >0.81 m/s as to improve gait speed for better community ambulation and to reduce fall risk.    Baseline 12/11/20: 14.6s = 0.68 m/s; 03/02/21: 14.3s =  0.70 m/s; 06/11/21: 13.2s = 0.76 m/s    Time 12    Period Weeks    Status Partially Met    Target Date 08/20/21                   Plan - 07/10/21 5301     Clinical Impression Statement Pt demonstrates excellent motivation during session today. Continued with strengthening on the Total Gym today and returned to resisted gait with the Nautilus machine. Continued practicing forward and lateral step-ups as this is the biggest functional challenge for patient outside of therapy. Pt encouraged to continue HEP. She requires physical therapy intervention to maintain her functional status including LE strength, balance, and gait speed/quality and she has not achieved maximal benefit from therapy services. She will benefit from continued PT services to address these deficits and maintain her function at home and school.    Personal Factors and Comorbidities Comorbidity 1;Past/Current Experience;Time since onset of injury/illness/exacerbation;Transportation;Social Background    Comorbidities spina bifida    Examination-Activity Limitations Bend;Carry;Continence;Lift;Reach Overhead;Squat;Stairs;Stand;Transfers    Examination-Participation Restrictions Church;Community Activity;Driving;Laundry;Meal Prep;Volunteer;Yard Work    Stability/Clinical Decision Making Stable/Uncomplicated    Rehab Potential Good    Clinical Impairments Affecting Rehab Potential weakness and decreased standing balance    PT Frequency  Biweekly    PT Duration 12 weeks    PT Treatment/Interventions ADLs/Self Care Home Management;Aquatic Therapy;Biofeedback;Ultrasound;Moist Heat;Iontophoresis 10m/ml Dexamethasone;Electrical Stimulation;DME Instruction;Gait training;Stair training;Functional mobility training;Neuromuscular re-education;Balance training;Therapeutic exercise;Therapeutic activities;Patient/family education;Orthotic Fit/Training;Wheelchair mobility training;Manual techniques;Dry needling;Passive range of motion;Energy conservation;Splinting;Taping;Vestibular;Visual/perceptual remediation/compensation;Canalith Repostioning;Cryotherapy    PT Next Visit Plan review HEP, balance and strength exercises    PT Home Exercise Plan Seated, supine, and core program: [id number]    Consulted and Agree with Plan of Care Patient               Patient will benefit from skilled therapeutic intervention in order to improve the following deficits and impairments:  Abnormal gait, Decreased mobility, Decreased balance, Decreased coordination, Difficulty walking, Decreased strength, Impaired tone, Postural dysfunction, Improper body mechanics  Visit Diagnosis: Unsteadiness on feet  Muscle weakness (generalized)     Problem List There are no problems to display for this patient.   JLyndel SafeHuprich PT, DPT, GCS  Rilea Arutyunyan 07/10/2021, 1:11 PM  [place] [facility] [place] [address]Phone: [phone]  Fax:  [phone] Name: MLINDSEY DEMONTEMRN: [id number] of Birth: 9April 15, 1997

## 2021-07-23 ENCOUNTER — Ambulatory Visit: Payer: Medicaid Other | Attending: Student

## 2021-07-23 ENCOUNTER — Other Ambulatory Visit: Payer: Self-pay

## 2021-07-23 DIAGNOSIS — M6281 Muscle weakness (generalized): Secondary | ICD-10-CM | POA: Insufficient documentation

## 2021-07-23 DIAGNOSIS — R2681 Unsteadiness on feet: Secondary | ICD-10-CM | POA: Diagnosis present

## 2021-07-23 NOTE — Therapy (Signed)
[place] [facility] [address] [place], [place], [address] Phone: [phone]   Fax:  [phone]  Physical Therapy Treatment   Patient Details  Name: Michelle Randall MRN: [id number] Date of Birth: 03/27/1996 Referring Provider (PT): Wayland Denis PA   Encounter Date: 07/23/2021   PT End of Session - 07/23/21 1705     Visit Number 17    Number of Visits 25    Date for PT Re-Evaluation 08/20/21    Authorization Type eval: 08/08/25, recert: 8/34/19, 05/06/28    Authorization Time Period 6 visits 8/25-11/16/22    Authorization - Visit Number 2    Authorization - Number of Visits 6    PT Start Time 1700    PT Stop Time 7989    PT Time Calculation (min) 45 min    Equipment Utilized During Treatment Gait belt;Other (comment)   Bilateral AFOs   Activity Tolerance Patient tolerated treatment well    Behavior During Therapy WFL for tasks assessed/performed               Past Medical History:  Diagnosis Date   Neurogenic bladder    Neurogenic bowel    S/P VP shunt    Spina bifida [facility])     Past Surgical History:  Procedure Laterality Date   VENTRICULOPERITONEAL SHUNT      There were no vitals filed for this visit.    Subjective Assessment - 07/23/21 1704     Subjective Pt reports that she is doing well today. She denies any pain upon arrival. No updates since last therapy session. No recent falls. No specific questions or concerns currently.    Pertinent History Patient is a pleasant 26 year old female who presents for continuation of care for generalized weakness/ coordination deficits secondary to diagnosis of spina bifida. PMH includes VP shunt, spina bifida, and neurogenic bowel/bladder. Patient was discharged from this clinic at the end of 2021 due to meeting therapy cap for insurance that year. Patient reports that she has continued performing her HEP but notes a decline when she stops therapy due to inability to safely  perform all of the balance and strength exercises as at home. One of the biggest challenge she is experiencing is stair navigation  She reports having multiple near falls when negotiating stairs at home. Patient must ascend/descend stairs to reach bedroom upstairs increasing her risk for falls daily.    Limitations Walking;Standing;House hold activities;Other (comment)   Stairs   How long can you sit comfortably? n/a    How long can you stand comfortably? require use of bilateral lofstrand crutches    How long can you walk comfortably? dependent upon terrain    Patient Stated Goals Patient wants to be able to negotiate uneven terrain, cross thresholds in home environment, and negotiate steps in household                 TREATMENT    Ther-ex  Total Gym L22 BLE squats 2 x 20, (AFOs donned); Total Gym L22 double leg jumps 2 x 10 BLE (AFOs donned); Total Gym L22 single leg squats 2 x 10 BLE (AFOs donned); 12" alternating forward step-ups with BUE support x 5 BLE;  Seated LAQ with 2# ankle weights (AW) x 10 BLE; Standing marches with 2# AW x 10 BLE;   Neuromuscular Re-education  All exercises performed without UE support unless otherwise noted; Forward/backward gait in // bars x 4 lengths; Lateral gait in // bars x  4 lengths; Semitandem balance alternating forward LE x 30s each; Semitandem balance alternating forward LE with horizontal and vertical head turns x 30s each on both sides;     Pt educated throughout session about proper posture and technique with exercises. Improved exercise technique, movement at target joints, use of target muscles after min to mod verbal, visual, tactile cues.      Pt demonstrates excellent motivation during session today. Continued with strengthening on the Total Gym today. Incorporated step-ups into session today but for the first time utilized a 12" step. Pt is able to perform but it is very challenging and she uses heavy UE assist. Step-ups remain  the biggest functional challenge for patient outside of therapy. Pt encouraged to continue HEP. She requires physical therapy intervention to maintain her functional status including LE strength, balance, and gait speed/quality and she has not achieved maximal benefit from therapy services. She will benefit from continued PT services to address these deficits and maintain her function at home and school.        PT Short Term Goals - 06/11/21 1714       PT SHORT TERM GOAL #1   Title Patient will report compliance with HEP for continued strengthening and stability during functional mobility.     Time 6    Period Weeks    Status Achieved               PT Long Term Goals - 06/12/21 1212       PT LONG TERM GOAL #1   Title Patient will increase her FOTO score to at least 59 to demonstrate increased function and mobility for higher quality of life.    Baseline 12/11/20: 51; 03/02/21: 49; 06/11/21: 53    Time 12    Period Weeks    Status Partially Met    Target Date 08/20/21      PT LONG TERM GOAL #2   Title Patient will maintain Berg Balance score of at least 32/56 in order to demonstrate decreased fall risk during functional activities.    Baseline 12/11/20: 32/56; 03/02/21: 32/56; 06/11/21: 33/56    Time 12    Period Weeks    Status On-going    Target Date 08/20/21      PT LONG TERM GOAL #3   Title Patient will be able to maintain safe performance with ascending/descending a flight of stairs using lofstrand crutches and railing in order to allow for safe navigation of her home environment.    Baseline 12/11/20: currently able to use lofstrands and/or railing with supervision by family; 03/02/21: Good maintenance observed; 06/11/21: Good maintenance observed;    Time 12    Period Weeks    Status On-going    Target Date 08/20/21      PT LONG TERM GOAL #4   Title Patient will complete a TUG test in no more than 13.5 seconds for independent mobility and decreased fall risk  maintenance    Baseline 12/11/20: 13.4s; 03/02/21: 13.5s; 06/11/21: 11.4s    Time 12    Period Weeks    Status On-going    Target Date 08/20/21      PT LONG TERM GOAL #5   Title Patient will increase fastest 10 meter walk test to >0.81 m/s as to improve gait speed for better community ambulation and to reduce fall risk.    Baseline 12/11/20: 14.6s = 0.68 m/s; 03/02/21: 14.3s = 0.70 m/s; 06/11/21: 13.2s = 0.76 m/s    Time 12  Period Weeks    Status Partially Met    Target Date 08/20/21                   Plan - 07/23/21 1705     Clinical Impression Statement Pt demonstrates excellent motivation during session today. Continued with strengthening on the Total Gym today. Incorporated step-ups into session today but for the first time utilized a 12" step. Pt is able to perform but it is very challenging and she uses heavy UE assist. Step-ups remain the biggest functional challenge for patient outside of therapy. Pt encouraged to continue HEP. She requires physical therapy intervention to maintain her functional status including LE strength, balance, and gait speed/quality and she has not achieved maximal benefit from therapy services. She will benefit from continued PT services to address these deficits and maintain her function at home and school.    Personal Factors and Comorbidities Comorbidity 1;Past/Current Experience;Time since onset of injury/illness/exacerbation;Transportation;Social Background    Comorbidities spina bifida    Examination-Activity Limitations Bend;Carry;Continence;Lift;Reach Overhead;Squat;Stairs;Stand;Transfers    Examination-Participation Restrictions Church;Community Activity;Driving;Laundry;Meal Prep;Volunteer;Yard Work    Stability/Clinical Decision Making Stable/Uncomplicated    Rehab Potential Good    Clinical Impairments Affecting Rehab Potential weakness and decreased standing balance    PT Frequency Biweekly    PT Duration 12 weeks    PT  Treatment/Interventions ADLs/Self Care Home Management;Aquatic Therapy;Biofeedback;Ultrasound;Moist Heat;Iontophoresis 63m/ml Dexamethasone;Electrical Stimulation;DME Instruction;Gait training;Stair training;Functional mobility training;Neuromuscular re-education;Balance training;Therapeutic exercise;Therapeutic activities;Patient/family education;Orthotic Fit/Training;Wheelchair mobility training;Manual techniques;Dry needling;Passive range of motion;Energy conservation;Splinting;Taping;Vestibular;Visual/perceptual remediation/compensation;Canalith Repostioning;Cryotherapy    PT Next Visit Plan review HEP, balance and strength exercises    PT Home Exercise Plan Seated, supine, and core program: [id number]    Consulted and Agree with Plan of Care Patient               Patient will benefit from skilled therapeutic intervention in order to improve the following deficits and impairments:  Abnormal gait, Decreased mobility, Decreased balance, Decreased coordination, Difficulty walking, Decreased strength, Impaired tone, Postural dysfunction, Improper body mechanics  Visit Diagnosis: Unsteadiness on feet  Muscle weakness (generalized)     Problem List There are no problems to display for this patient.   JLyndel SafeHuprich PT, DPT, GCS  Jayro Mcmath 07/24/2021, 11:45 AM  [facility] [place] [address]Phone: [phone]  Fax:  [phone] Name: Michelle LAUMANNMRN: [id number] of Birth: 9Aug 09, 1997

## 2021-08-06 ENCOUNTER — Other Ambulatory Visit: Payer: Self-pay

## 2021-08-06 ENCOUNTER — Ambulatory Visit: Payer: Medicaid Other

## 2021-08-06 DIAGNOSIS — M6281 Muscle weakness (generalized): Secondary | ICD-10-CM

## 2021-08-06 DIAGNOSIS — R2681 Unsteadiness on feet: Secondary | ICD-10-CM | POA: Diagnosis not present

## 2021-08-07 NOTE — Therapy (Signed)
[place] [facility] [address] [place], [place], [address] Phone: [phone]   Fax:  [phone]  Physical Therapy Treatment   Patient Details  Name: Michelle Randall MRN: [id number] Date of Birth: 1996-02-23 Referring Provider (PT): Wayland Denis PA   Encounter Date: 08/06/2021   PT End of Session - 08/06/21 1714     Visit Number 18    Number of Visits 25    Date for PT Re-Evaluation 08/20/21    Authorization Type eval: 0/03/70, recert: 4/88/89, 1/69/45    Authorization Time Period 6 visits 8/25-11/16/22    Authorization - Visit Number 3    Authorization - Number of Visits 6    PT Start Time 0388    PT Stop Time 1745    PT Time Calculation (min) 38 min    Equipment Utilized During Treatment Gait belt;Other (comment)   Bilateral AFOs   Activity Tolerance Patient tolerated treatment well    Behavior During Therapy WFL for tasks assessed/performed               Past Medical History:  Diagnosis Date   Neurogenic bladder    Neurogenic bowel    S/P VP shunt    Spina bifida [facility])     Past Surgical History:  Procedure Laterality Date   VENTRICULOPERITONEAL SHUNT      There were no vitals filed for this visit.    Subjective Assessment - 08/06/21 1714     Subjective Pt reports that she is doing well today. She denies any pain upon arrival. No updates since last therapy session. No recent falls. No specific questions or concerns currently.    Pertinent History Patient is a pleasant 25 year old female who presents for continuation of care for generalized weakness/ coordination deficits secondary to diagnosis of spina bifida. PMH includes VP shunt, spina bifida, and neurogenic bowel/bladder. Patient was discharged from this clinic at the end of 2021 due to meeting therapy cap for insurance that year. Patient reports that she has continued performing her HEP but notes a decline when she stops therapy due to inability to safely  perform all of the balance and strength exercises as at home. One of the biggest challenge she is experiencing is stair navigation  She reports having multiple near falls when negotiating stairs at home. Patient must ascend/descend stairs to reach bedroom upstairs increasing her risk for falls daily.    Limitations Walking;Standing;House hold activities;Other (comment)   Stairs   How long can you sit comfortably? n/a    How long can you stand comfortably? require use of bilateral lofstrand crutches    How long can you walk comfortably? dependent upon terrain    Patient Stated Goals Patient wants to be able to negotiate uneven terrain, cross thresholds in home environment, and negotiate steps in household                 TREATMENT    Ther-ex  NuStep BLE only x 2-3 x 5 minutes for warm-up during interval history; Forward/backward gait in // bars x 4 lengths; Lateral gait in // bars x 4 lengths; Standing hip abduction with 2# ankle weights (AW) x 10 BLE; Seated LAQ with 2# AW  x 10 BLE; Standing marches with 2# AW x 10 BLE; Lateral stepping in // bars with 2# AW x 4 lengths; 6" alternating step-ups with 2# AW x 10 BLE; Forward/backward stepping over 1/2 foam roll with BUE support x 5 leading with each  LE; Nautilus resisted forward and backward ambulation with bilateral Lofstrand crutches 30# x 3 each direction;     Pt educated throughout session about proper posture and technique with exercises. Improved exercise technique, movement at target joints, use of target muscles after min to mod verbal, visual, tactile cues.      Pt demonstrates excellent motivation during session today.  Session today focused primarily on strengthening.  Continued to work on step-ups as they remain the biggest functional challenge for patient outside of therapy.  Included additional standing and seated ankle weight resisted exercises and reintroduced resisted gait using the Nautilus machine.  Pt encouraged  to continue HEP. She requires physical therapy intervention to maintain her functional status including LE strength, balance, and gait speed/quality and she has not achieved maximal benefit from therapy services. She will benefit from continued PT services to address these deficits and maintain her function at home and school.        PT Short Term Goals - 06/11/21 1714       PT SHORT TERM GOAL #1   Title Patient will report compliance with HEP for continued strengthening and stability during functional mobility.     Time 6    Period Weeks    Status Achieved               PT Long Term Goals - 06/12/21 1212       PT LONG TERM GOAL #1   Title Patient will increase her FOTO score to at least 59 to demonstrate increased function and mobility for higher quality of life.    Baseline 12/11/20: 51; 03/02/21: 49; 06/11/21: 53    Time 12    Period Weeks    Status Partially Met    Target Date 08/20/21      PT LONG TERM GOAL #2   Title Patient will maintain Berg Balance score of at least 32/56 in order to demonstrate decreased fall risk during functional activities.    Baseline 12/11/20: 32/56; 03/02/21: 32/56; 06/11/21: 33/56    Time 12    Period Weeks    Status On-going    Target Date 08/20/21      PT LONG TERM GOAL #3   Title Patient will be able to maintain safe performance with ascending/descending a flight of stairs using lofstrand crutches and railing in order to allow for safe navigation of her home environment.    Baseline 12/11/20: currently able to use lofstrands and/or railing with supervision by family; 03/02/21: Good maintenance observed; 06/11/21: Good maintenance observed;    Time 12    Period Weeks    Status On-going    Target Date 08/20/21      PT LONG TERM GOAL #4   Title Patient will complete a TUG test in no more than 13.5 seconds for independent mobility and decreased fall risk maintenance    Baseline 12/11/20: 13.4s; 03/02/21: 13.5s; 06/11/21: 11.4s    Time 12     Period Weeks    Status On-going    Target Date 08/20/21      PT LONG TERM GOAL #5   Title Patient will increase fastest 10 meter walk test to >0.81 m/s as to improve gait speed for better community ambulation and to reduce fall risk.    Baseline 12/11/20: 14.6s = 0.68 m/s; 03/02/21: 14.3s = 0.70 m/s; 06/11/21: 13.2s = 0.76 m/s    Time 12    Period Weeks    Status Partially Met    Target Date 08/20/21  Plan - 08/06/21 1715     Clinical Impression Statement Pt demonstrates excellent motivation during session today.  Session today focused primarily on strengthening.  Continued to work on step-ups as they remain the biggest functional challenge for patient outside of therapy.  Included additional standing and seated ankle weight resisted exercises and reintroduced resisted gait using the Nautilus machine.  Pt encouraged to continue HEP. She requires physical therapy intervention to maintain her functional status including LE strength, balance, and gait speed/quality and she has not achieved maximal benefit from therapy services. She will benefit from continued PT services to address these deficits and maintain her function at home and school    Personal Factors and Comorbidities Comorbidity 1;Past/Current Experience;Time since onset of injury/illness/exacerbation;Transportation;Social Background    Comorbidities spina bifida    Examination-Activity Limitations Bend;Carry;Continence;Lift;Reach Overhead;Squat;Stairs;Stand;Transfers    Examination-Participation Restrictions Church;Community Activity;Driving;Laundry;Meal Prep;Volunteer;Yard Work    Stability/Clinical Decision Making Stable/Uncomplicated    Rehab Potential Good    Clinical Impairments Affecting Rehab Potential weakness and decreased standing balance    PT Frequency Biweekly    PT Duration 12 weeks    PT Treatment/Interventions ADLs/Self Care Home Management;Aquatic Therapy;Biofeedback;Ultrasound;Moist  Heat;Iontophoresis 80m/ml Dexamethasone;Electrical Stimulation;DME Instruction;Gait training;Stair training;Functional mobility training;Neuromuscular re-education;Balance training;Therapeutic exercise;Therapeutic activities;Patient/family education;Orthotic Fit/Training;Wheelchair mobility training;Manual techniques;Dry needling;Passive range of motion;Energy conservation;Splinting;Taping;Vestibular;Visual/perceptual remediation/compensation;Canalith Repostioning;Cryotherapy    PT Next Visit Plan review HEP, balance and strength exercises    PT Home Exercise Plan Seated, supine, and core program: [id number]    Consulted and Agree with Plan of Care Patient               Patient will benefit from skilled therapeutic intervention in order to improve the following deficits and impairments:  Abnormal gait, Decreased mobility, Decreased balance, Decreased coordination, Difficulty walking, Decreased strength, Impaired tone, Postural dysfunction, Improper body mechanics  Visit Diagnosis: Unsteadiness on feet  Muscle weakness (generalized)     Problem List There are no problems to display for this patient.   JPhillips GroutPT, DPT, GCS  Tanicia Wolaver 08/07/2021, 12:56 PM  [place] [facility] [place] [address]Phone: [phone]  Fax:  [phone] Name: MMARDA BREIDENBACHMRN: [id number] of Birth: [birth year]

## 2021-08-20 ENCOUNTER — Ambulatory Visit: Payer: Medicaid Other | Attending: Student | Admitting: Physical Therapy

## 2021-08-20 ENCOUNTER — Other Ambulatory Visit: Payer: Self-pay

## 2021-08-20 ENCOUNTER — Encounter: Payer: Self-pay | Admitting: Physical Therapy

## 2021-08-20 DIAGNOSIS — R262 Difficulty in walking, not elsewhere classified: Secondary | ICD-10-CM | POA: Diagnosis present

## 2021-08-20 DIAGNOSIS — R2681 Unsteadiness on feet: Secondary | ICD-10-CM | POA: Insufficient documentation

## 2021-08-20 DIAGNOSIS — M6281 Muscle weakness (generalized): Secondary | ICD-10-CM | POA: Diagnosis present

## 2021-08-20 NOTE — Therapy (Signed)
[facility] [address]. [place], [place], [address] Phone: [phone]   Fax:  [phone]  Physical Therapy Treatment  Patient Details  Name: Michelle Randall MRN: [id number] Date of Birth: Jan 16, 1996 Referring Provider (PT): Wayland Denis PA   Encounter Date: 08/20/2021   PT End of Session - 08/20/21 1640     Visit Number 19    Number of Visits 31    Date for PT Re-Evaluation 11/12/21    Authorization Type eval: 7/00/17, recert: 4/94/49, 6/75/91, 08/20/21    Authorization Time Period 6 visits 8/25-11/16/22    Authorization - Visit Number 4    Authorization - Number of Visits 6    PT Start Time 1630    PT Stop Time 1710    PT Time Calculation (min) 40 min    Equipment Utilized During Treatment Gait belt;Other (comment)   Bilateral AFOs   Activity Tolerance Patient tolerated treatment well    Behavior During Therapy WFL for tasks assessed/performed             Past Medical History:  Diagnosis Date   Neurogenic bladder    Neurogenic bowel    S/P VP shunt    Spina bifida [facility])     Past Surgical History:  Procedure Laterality Date   VENTRICULOPERITONEAL SHUNT      There were no vitals filed for this visit.   Subjective Assessment - 08/20/21 1639     Subjective Patient denies any falls or significant pain since last session. Patient presents with no questions or new concerns.    Pertinent History Patient is a pleasant 25 year old female who presents for continuation of care for generalized weakness/ coordination deficits secondary to diagnosis of spina bifida. PMH includes VP shunt, spina bifida, and neurogenic bowel/bladder. Patient was discharged from this clinic at the end of 2021 due to meeting therapy cap for insurance that year. Patient reports that she has continued performing her HEP but notes a decline when she stops therapy due to inability to safely perform all of the balance and strength exercises as at  home. One of the biggest challenge she is experiencing is stair navigation  She reports having multiple near falls when negotiating stairs at home. Patient must ascend/descend stairs to reach bedroom upstairs increasing her risk for falls daily.    Limitations Walking;Standing;House hold activities;Other (comment)   Stairs   How long can you sit comfortably? n/a    How long can you stand comfortably? require use of bilateral lofstrand crutches    How long can you walk comfortably? dependent upon terrain    Patient Stated Goals Patient wants to be able to negotiate uneven terrain, cross thresholds in home environment, and negotiate steps in household    Currently in Pain? No/denies                [facility] PT Assessment - 08/20/21 0001       Berg Balance Test   Sit to Stand Able to stand without using hands and stabilize independently    Standing Unsupported Able to stand safely 2 minutes    Sitting with Back Unsupported but Feet Supported on Floor or Stool Able to sit safely and securely 2 minutes    Stand to Sit Controls descent by using hands    Transfers Able to transfer safely, definite need of hands    Standing Unsupported with Eyes Closed Able to stand 10 seconds with supervision    Standing Unsupported  with Feet Together Able to place feet together independently and stand for 1 minute with supervision    From Standing, Reach Forward with Outstretched Arm Can reach forward >5 cm safely (2")    From Standing Position, Pick up Object from [place] to pick up shoe, needs supervision    From Standing Position, Turn to Look Behind Over each Shoulder Needs supervision when turning    Turn 360 Degrees Needs assistance while turning    Standing Unsupported, Alternately Place Feet on Step/Stool Able to complete >2 steps/needs minimal assist    Standing Unsupported, One Foot in [place] to take small step independently and hold 30 seconds    Standing on One Leg Tries to lift leg/unable to  hold 3 seconds but remains standing independently    Total Score 34             TREATMENT  Neuromuscular Re-education: Reassessed goals; see below.  Standing postural control in // bars: SUE Pallof press variation/unilateral Pilates hug a tree , GTB, SBA, faded UE support as tolerated, to fatigue  Patient educated throughout session on appropriate technique and form using multi-modal cueing, HEP, and activity modification. Patient articulated understanding and returned demonstration.  Patient Response to interventions: Notes some soreness in the legs from workout  ASSESSMENT Patient presents to clinic with excellent motivation to participate in therapy. Patient demonstrates continued deficits in BLE strength, balance, postural control, and gait speed/quality. Goal reassessment during the visit today indicated continued maintenance with physical therapy intervention in combination with comprehensive HEP. Patient able to perform variation of Pallof press/Pilates postural control exercise against resistance with minimal TCs and faded UE support during today's session and responded positively to active interventions. Patient has not received/achieved maximum benefit from physical therapy and requires continued skilled intervention to maintain current level of independence and function. Patient will benefit from continued skilled therapeutic intervention to address remaining deficits in BLE strength, balance, postural control, and gait speed/quality in order to maintain function, level of independence, and QoL.     PT Short Term Goals - 06/11/21 1714       PT SHORT TERM GOAL #1   Title Patient will report compliance with HEP for continued strengthening and stability during functional mobility.     Time 6    Period Weeks    Status Achieved               PT Long Term Goals - 08/20/21 1641       PT LONG TERM GOAL #1   Title Patient will increase her FOTO score to at least 59 to  demonstrate increased function and mobility for higher quality of life.    Baseline 12/11/20: 51; 03/02/21: 49; 06/11/21: 53; 08/20/21: 53    Time 12    Period Weeks    Status Partially Met    Target Date 11/12/21      PT LONG TERM GOAL #2   Title Patient will maintain Berg Balance score of at least 32/56 in order to demonstrate decreased fall risk during functional activities.    Baseline 12/11/20: 32/56; 03/02/21: 32/56; 06/11/21: 33/56; 08/20/2021: 34/56    Time 12    Period Weeks    Status On-going    Target Date 11/12/21      PT LONG TERM GOAL #3   Title Patient will be able to maintain safe performance with ascending/descending a flight of stairs using lofstrand crutches and railing in order to allow for safe navigation of her  home environment.    Baseline 12/11/20: currently able to use lofstrands and/or railing with supervision by family; 03/02/21: Good maintenance observed; 06/11/21: Good maintenance observed; 08/20/2021: Good maintenance observed    Time 12    Period Weeks    Status On-going    Target Date 11/12/21      PT LONG TERM GOAL #4   Title Patient will complete a TUG test in no more than 13.5 seconds for independent mobility and decreased fall risk maintenance    Baseline 12/11/20: 13.4s; 03/02/21: 13.5s; 06/11/21: 11.4s; 08/20/21: 12.35s    Time 12    Period Weeks    Status On-going    Target Date 11/12/21      PT LONG TERM GOAL #5   Title Patient will increase fastest 10 meter walk test to >0.81 m/s as to improve gait speed for better community ambulation and to reduce fall risk.    Baseline 12/11/20: 14.6s = 0.68 m/s; 03/02/21: 14.3s = 0.70 m/s; 06/11/21: 13.2s = 0.76 m/s; 08/20/21: 13.5s= 0.74 m/s    Time 12    Period Weeks    Status Partially Met    Target Date 11/12/21                   Plan - 08/20/21 1752     Clinical Impression Statement Patient presents to clinic with excellent motivation to participate in therapy. Patient demonstrates continued deficits  in BLE strength, balance, postural control, and gait speed/quality. Goal reassessment during the visit today indicated continued maintenance with physical therapy intervention in combination with comprehensive HEP. Patient able to perform variation of Pallof press/Pilates postural control exercise against resistance with minimal TCs and faded UE support during today's session and responded positively to active interventions. Patient has not received/achieved maximum benefit from physical therapy and requires continued skilled intervention to maintain current level of independence and function. Patient will benefit from continued skilled therapeutic intervention to address remaining deficits in BLE strength, balance, postural control, and gait speed/quality in order to maintain function, level of independence, and QoL.    Personal Factors and Comorbidities Comorbidity 1;Past/Current Experience;Time since onset of injury/illness/exacerbation;Transportation;Social Background    Comorbidities spina bifida    Examination-Activity Limitations Bend;Carry;Continence;Lift;Reach Overhead;Squat;Stairs;Stand;Transfers    Examination-Participation Restrictions Church;Community Activity;Driving;Laundry;Meal Prep;Volunteer;Yard Work    Stability/Clinical Decision Making Stable/Uncomplicated    Rehab Potential Good    Clinical Impairments Affecting Rehab Potential weakness and decreased standing balance    PT Frequency Biweekly    PT Duration 12 weeks    PT Treatment/Interventions ADLs/Self Care Home Management;Aquatic Therapy;Biofeedback;Ultrasound;Moist Heat;Iontophoresis 89m/ml Dexamethasone;Electrical Stimulation;DME Instruction;Gait training;Stair training;Functional mobility training;Neuromuscular re-education;Balance training;Therapeutic exercise;Therapeutic activities;Patient/family education;Orthotic Fit/Training;Wheelchair mobility training;Manual techniques;Dry needling;Passive range of motion;Energy  conservation;Splinting;Taping;Vestibular;Visual/perceptual remediation/compensation;Canalith Repostioning;Cryotherapy    PT Next Visit Plan review HEP, balance and strength exercises    PT Home Exercise Plan Seated, supine, and core program: [id number]    Consulted and Agree with Plan of Care Patient             Patient will benefit from skilled therapeutic intervention in order to improve the following deficits and impairments:  Abnormal gait, Decreased mobility, Decreased balance, Decreased coordination, Difficulty walking, Decreased strength, Impaired tone, Postural dysfunction, Improper body mechanics  Visit Diagnosis: Unsteadiness on feet  Muscle weakness (generalized)  Difficulty in walking, not elsewhere classified     Problem List There are no problems to display for this patient.  KMyles GipPT, DPT #[phone] 08/20/2021, 5:53 PM  [place]  [facility] [address] [place], [place], [address] Phone: [phone]   Fax:  [phone]  Name: MARIAMA SAINTVIL MRN: [id number] Date of Birth: 23-Jul-1996

## 2021-09-03 ENCOUNTER — Ambulatory Visit: Payer: Medicaid Other

## 2021-09-03 ENCOUNTER — Other Ambulatory Visit: Payer: Self-pay

## 2021-09-03 DIAGNOSIS — R2681 Unsteadiness on feet: Secondary | ICD-10-CM

## 2021-09-03 DIAGNOSIS — M6281 Muscle weakness (generalized): Secondary | ICD-10-CM

## 2021-09-03 NOTE — Therapy (Signed)
[facility] [address] [place], [place], [address] Phone: [phone]   Fax:  [phone]  Physical Therapy Progress Note  Dates of reporting period  04/16/21   to   09/03/21   Patient Details  Name: Michelle Randall MRN: [id number] Date of Birth: 1996-10-02 Referring Provider (PT): Wayland Denis PA   Encounter Date: 09/03/2021   PT End of Session - 09/03/21 1712     Visit Number 20    Number of Visits 31    Date for PT Re-Evaluation 11/12/21    Authorization Type eval: 4/40/10, recert: 2/72/53, 6/64/40, 08/20/21    Authorization Time Period 6 visits 8/25-11/16/22    Authorization - Visit Number 5    Authorization - Number of Visits 6    PT Start Time 3474    PT Stop Time 2595    PT Time Calculation (min) 42 min    Equipment Utilized During Treatment Gait belt;Other (comment)   Bilateral AFOs   Activity Tolerance Patient tolerated treatment well    Behavior During Therapy WFL for tasks assessed/performed               Past Medical History:  Diagnosis Date   Neurogenic bladder    Neurogenic bowel    S/P VP shunt    Spina bifida [facility])     Past Surgical History:  Procedure Laterality Date   VENTRICULOPERITONEAL SHUNT      There were no vitals filed for this visit.    Subjective Assessment - 09/03/21 1711     Subjective Patient denies any falls or significant pain since last session. Patient presents with no questions or new concerns. Her stair lift is working however the motor has been making noise and the technician is coming soon to replace the motor.    Pertinent History Patient is a pleasant 25 year old female who presents for continuation of care for generalized weakness/ coordination deficits secondary to diagnosis of spina bifida. PMH includes VP shunt, spina bifida, and neurogenic bowel/bladder. Patient was discharged from this clinic at the end of 2021 due to meeting therapy cap for insurance that  year. Patient reports that she has continued performing her HEP but notes a decline when she stops therapy due to inability to safely perform all of the balance and strength exercises as at home. One of the biggest challenge she is experiencing is stair navigation  She reports having multiple near falls when negotiating stairs at home. Patient must ascend/descend stairs to reach bedroom upstairs increasing her risk for falls daily.    Limitations Walking;Standing;House hold activities;Other (comment)   Stairs   How long can you sit comfortably? n/a    How long can you stand comfortably? require use of bilateral lofstrand crutches    How long can you walk comfortably? dependent upon terrain    Patient Stated Goals Patient wants to be able to negotiate uneven terrain, cross thresholds in home environment, and negotiate steps in household    Currently in Pain? No/denies                 TREATMENT    Neuromuscular Re-education  NuStep BLE only L1-2 x 5 minutes for warm-up during interval history; Forward/backward gait in // bars x 4 lengths; Lateral gait in // bars x 4 lengths; Standing postural control in // bars BUE Pallof press x 10 toward both side; Pball seated static balance x 30s; Pball seated alternating marching x 30s; Pball seated bicep  curls with 4# dumbbells (DB) x 10 BUE; Pball seated overhead shoulder press with 4# DB x 10 BUE; 6" alternating step-ups with x 10 BLE; Airex wide stance static balance x 30s; Airex staggered stance with front foot on 6" step x 30s on each leg;    Pt educated throughout session about proper posture and technique with exercises. Improved exercise technique, movement at target joints, use of target muscles after min to mod verbal, visual, tactile cues.      Patient presents to clinic with excellent motivation to participate in therapy. Patient demonstrates continued deficits in BLE strength, balance, postural control, and gait speed/quality.  Goals updated during last session so they were not repeated again today.  During last visit outcome measures indicated continued maintenance with physical therapy intervention in combination with comprehensive HEP. Continued to progress dynamic balance on unstable surfaces such as sitting on physioball and standing on Airex pad. Also repeated Pallof press for dynamic challenge. Patient has not received/achieved maximum benefit from physical therapy. She requires physical therapy intervention to maintain her functional status including LE strength, balance, and gait speed/quality and she has not achieved maximal benefit from therapy services. Patient encouraged to continue HEP. She will benefit from continued skilled therapeutic intervention to address remaining deficits in BLE strength, balance, postural control, and gait speed/quality in order to maintain function, level of independence, and QoL.        PT Short Term Goals - 09/04/21 1852       PT SHORT TERM GOAL #1   Title Patient will report compliance with HEP for continued strengthening and stability during functional mobility.     Time 6    Period Weeks    Status Achieved               PT Long Term Goals - 09/04/21 1853       PT LONG TERM GOAL #1   Title Patient will increase her FOTO score to at least 59 to demonstrate increased function and mobility for higher quality of life.    Baseline 12/11/20: 51; 03/02/21: 49; 06/11/21: 53; 08/20/21: 53    Time 12    Period Weeks    Status Partially Met    Target Date 11/12/21      PT LONG TERM GOAL #2   Title Patient will maintain Berg Balance score of at least 32/56 in order to demonstrate decreased fall risk during functional activities.    Baseline 12/11/20: 32/56; 03/02/21: 32/56; 06/11/21: 33/56; 08/20/2021: 34/56    Time 12    Period Weeks    Status On-going    Target Date 11/12/21      PT LONG TERM GOAL #3   Title Patient will be able to maintain safe performance with  ascending/descending a flight of stairs using lofstrand crutches and railing in order to allow for safe navigation of her home environment.    Baseline 12/11/20: currently able to use lofstrands and/or railing with supervision by family; 03/02/21: Good maintenance observed; 06/11/21: Good maintenance observed; 08/20/2021: Good maintenance observed    Time 12    Period Weeks    Status On-going    Target Date 11/12/21      PT LONG TERM GOAL #4   Title Patient will complete a TUG test in no more than 13.5 seconds for independent mobility and decreased fall risk maintenance    Baseline 12/11/20: 13.4s; 03/02/21: 13.5s; 06/11/21: 11.4s; 08/20/21: 12.35s    Time 12    Period [place]  Status On-going    Target Date 11/12/21      PT LONG TERM GOAL #5   Title Patient will increase fastest 10 meter walk test to >0.81 m/s as to improve gait speed for better community ambulation and to reduce fall risk.    Baseline 12/11/20: 14.6s = 0.68 m/s; 03/02/21: 14.3s = 0.70 m/s; 06/11/21: 13.2s = 0.76 m/s; 08/20/21: 13.5s= 0.74 m/s    Time 12    Period Weeks    Status Partially Met    Target Date 11/12/21                   Plan - 09/03/21 1713     Clinical Impression Statement Patient presents to clinic with excellent motivation to participate in therapy. Patient demonstrates continued deficits in BLE strength, balance, postural control, and gait speed/quality. Goals updated during last session so they were not repeated again today.  During last visit outcome measures indicated continued maintenance with physical therapy intervention in combination with comprehensive HEP. Continued to progress dynamic balance on unstable surfaces such as sitting on physioball and standing on Airex pad. Also repeated Pallof press for dynamic challenge. Patient has not received/achieved maximum benefit from physical therapy. She requires physical therapy intervention to maintain her functional status including LE strength, balance,  and gait speed/quality and she has not achieved maximal benefit from therapy services. Patient encouraged to continue HEP. She will benefit from continued skilled therapeutic intervention to address remaining deficits in BLE strength, balance, postural control, and gait speed/quality in order to maintain function, level of independence, and QoL.    Personal Factors and Comorbidities Comorbidity 1;Past/Current Experience;Time since onset of injury/illness/exacerbation;Transportation;Social Background    Comorbidities spina bifida    Examination-Activity Limitations Bend;Carry;Continence;Lift;Reach Overhead;Squat;Stairs;Stand;Transfers    Examination-Participation Restrictions Church;Community Activity;Driving;Laundry;Meal Prep;Volunteer;Yard Work    Stability/Clinical Decision Making Stable/Uncomplicated    Rehab Potential Good    Clinical Impairments Affecting Rehab Potential weakness and decreased standing balance    PT Frequency Biweekly    PT Duration 12 weeks    PT Treatment/Interventions ADLs/Self Care Home Management;Aquatic Therapy;Biofeedback;Ultrasound;Moist Heat;Iontophoresis 106m/ml Dexamethasone;Electrical Stimulation;DME Instruction;Gait training;Stair training;Functional mobility training;Neuromuscular re-education;Balance training;Therapeutic exercise;Therapeutic activities;Patient/family education;Orthotic Fit/Training;Wheelchair mobility training;Manual techniques;Dry needling;Passive range of motion;Energy conservation;Splinting;Taping;Vestibular;Visual/perceptual remediation/compensation;Canalith Repostioning;Cryotherapy    PT Next Visit Plan review HEP, balance and strength exercises    PT Home Exercise Plan Seated, supine, and core program: [id number]    Consulted and Agree with Plan of Care Patient               Patient will benefit from skilled therapeutic intervention in order to improve the following deficits and impairments:  Abnormal gait, Decreased mobility, Decreased  balance, Decreased coordination, Difficulty walking, Decreased strength, Impaired tone, Postural dysfunction, Improper body mechanics  Visit Diagnosis: Unsteadiness on feet  Muscle weakness (generalized)     Problem List There are no problems to display for this patient.   JPhillips GroutPT, DPT, GCS  Isiaih Hollenbach 09/04/2021, 6:54 PM  [place] [facility] [place] [address]Phone: [phone]  Fax:  [phone] Name: MMAZELL AYLESWORTHMRN: [id number] of Birth: [birth year]

## 2021-09-17 ENCOUNTER — Other Ambulatory Visit: Payer: Self-pay

## 2021-09-17 ENCOUNTER — Ambulatory Visit: Payer: Medicaid Other | Attending: Student

## 2021-09-17 DIAGNOSIS — M6281 Muscle weakness (generalized): Secondary | ICD-10-CM | POA: Diagnosis present

## 2021-09-17 DIAGNOSIS — R2681 Unsteadiness on feet: Secondary | ICD-10-CM | POA: Insufficient documentation

## 2021-09-17 DIAGNOSIS — R262 Difficulty in walking, not elsewhere classified: Secondary | ICD-10-CM | POA: Diagnosis present

## 2021-09-17 NOTE — Therapy (Signed)
[facility] [address] [place], [place], [address] Phone: [phone]   Fax:  [phone]  Physical Therapy Treatment/Progress Note    Patient Details  Name: Michelle Randall MRN: [id number] Date of Birth: 08/15/1996 Referring Provider (PT): Wayland Denis PA   Encounter Date: 09/17/2021   PT End of Session - 09/17/21 1712     Visit Number 21    Number of Visits 31    Date for PT Re-Evaluation 11/12/21    Authorization Type eval: 1/44/31, recert: 5/40/08, 6/76/19, 08/20/21    Authorization Time Period 6 visits 8/25-11/16/22    Authorization - Visit Number 6    Authorization - Number of Visits 6    PT Start Time 1700    PT Stop Time 5093    PT Time Calculation (min) 45 min    Equipment Utilized During Treatment Gait belt;Other (comment)   Bilateral AFOs   Activity Tolerance Patient tolerated treatment well    Behavior During Therapy WFL for tasks assessed/performed                Past Medical History:  Diagnosis Date   Neurogenic bladder    Neurogenic bowel    S/P VP shunt    Spina bifida [facility])     Past Surgical History:  Procedure Laterality Date   VENTRICULOPERITONEAL SHUNT      There were no vitals filed for this visit.    Subjective Assessment - 09/18/21 0926     Subjective Patient denies any falls or significant pain since last session. Her stair lift was fixed at home and is now working well. Patient presents with no questions or new concerns.    Pertinent History Patient is a pleasant 25 year old female who presents for continuation of care for generalized weakness/ coordination deficits secondary to diagnosis of spina bifida. PMH includes VP shunt, spina bifida, and neurogenic bowel/bladder. Patient was discharged from this clinic at the end of 2021 due to meeting therapy cap for insurance that year. Patient reports that she has continued performing her HEP but notes a decline when she stops therapy  due to inability to safely perform all of the balance and strength exercises as at home. One of the biggest challenge she is experiencing is stair navigation  She reports having multiple near falls when negotiating stairs at home. Patient must ascend/descend stairs to reach bedroom upstairs increasing her risk for falls daily.    Limitations Walking;Standing;House hold activities;Other (comment)   Stairs   How long can you sit comfortably? n/a    How long can you stand comfortably? require use of bilateral lofstrand crutches    How long can you walk comfortably? dependent upon terrain    Patient Stated Goals Patient wants to be able to negotiate uneven terrain, cross thresholds in home environment, and negotiate steps in household    Currently in Pain? No/denies                  TREATMENT    Ther-ex  NuStep BLE only L1-2 x 5 minutes for warm-up during interval history; Total Gym L22 BLE squats 2 x 20, (AFOs donned); Total Gym L22 double leg jumps 2 x 10 BLE (AFOs donned); Total Gym L22 single leg squats 2 x 10 BLE (AFOs donned); Nautilus resisted forward and backward ambulation with bilateral Lofstrand crutches 30# x 3 each direction; 6" alternating step-ups with x 10 BLE;   Pt educated throughout session about proper posture and  technique with exercises. Improved exercise technique, movement at target joints, use of target muscles after min to mod verbal, visual, tactile cues.      Patient presents to clinic with excellent motivation to participate in therapy. Patient demonstrates continued deficits in BLE strength, balance, postural control, and gait speed/quality. Goals updated during visit on 08/20/21 so outcome measures were not repeated as not enough time has elapsed.  During last visit outcome measures indicated continued maintenance with physical therapy intervention in combination with comprehensive HEP. Today's session focused on dynamic strengthening on Total Gym Patient  and with resisted walking using Nautilus Freedom Trainer. She has not received/achieved maximum benefit from physical therapy. She requires physical therapy intervention to maintain her functional status including LE strength, balance, and gait speed/quality and she has not achieved maximal benefit from therapy services. Patient encouraged to continue HEP. She will benefit from continued skilled therapeutic intervention to address remaining deficits in BLE strength, balance, postural control, and gait speed/quality in order to maintain function, level of independence, and QoL.        PT Short Term Goals - 09/18/21 0955       PT SHORT TERM GOAL #1   Title Patient will report compliance with HEP for continued strengthening and stability during functional mobility.     Time 6    Period Weeks    Status Achieved               PT Long Term Goals - 09/18/21 0955       PT LONG TERM GOAL #1   Title Patient will increase her FOTO score to at least 59 to demonstrate increased function and mobility for higher quality of life.    Baseline 12/11/20: 51; 03/02/21: 49; 06/11/21: 53; 08/20/21: 53    Time 12    Period Weeks    Status Partially Met    Target Date 11/12/21      PT LONG TERM GOAL #2   Title Patient will maintain Berg Balance score of at least 32/56 in order to demonstrate decreased fall risk during functional activities.    Baseline 12/11/20: 32/56; 03/02/21: 32/56; 06/11/21: 33/56; 08/20/2021: 34/56    Time 12    Period Weeks    Status On-going    Target Date 11/12/21      PT LONG TERM GOAL #3   Title Patient will be able to maintain safe performance with ascending/descending a flight of stairs using lofstrand crutches and railing in order to allow for safe navigation of her home environment.    Baseline 12/11/20: currently able to use lofstrands and/or railing with supervision by family; 03/02/21: Good maintenance observed; 06/11/21: Good maintenance observed; 08/20/2021: Good  maintenance observed    Time 12    Period Weeks    Status On-going    Target Date 11/12/21      PT LONG TERM GOAL #4   Title Patient will complete a TUG test in no more than 13.5 seconds for independent mobility and decreased fall risk maintenance    Baseline 12/11/20: 13.4s; 03/02/21: 13.5s; 06/11/21: 11.4s; 08/20/21: 12.35s    Time 12    Period Weeks    Status On-going    Target Date 11/12/21      PT LONG TERM GOAL #5   Title Patient will increase fastest 10 meter walk test to >0.81 m/s as to improve gait speed for better community ambulation and to reduce fall risk.    Baseline 12/11/20: 14.6s = 0.68 m/s; 03/02/21: 14.3s =  0.70 m/s; 06/11/21: 13.2s = 0.76 m/s; 08/20/21: 13.5s= 0.74 m/s    Time 12    Period Weeks    Status Partially Met    Target Date 11/12/21                   Plan - 09/18/21 0954     Clinical Impression Statement Patient presents to clinic with excellent motivation to participate in therapy. Patient demonstrates continued deficits in BLE strength, balance, postural control, and gait speed/quality. Goals updated during visit on 08/20/21 so outcome measures were not repeated as not enough time has elapsed.  During last visit outcome measures indicated continued maintenance with physical therapy intervention in combination with comprehensive HEP. Today's session focused on dynamic strengthening on Total Gym Patient and with resisted walking using Nautilus Freedom Trainer. She has not received/achieved maximum benefit from physical therapy. She requires physical therapy intervention to maintain her functional status including LE strength, balance, and gait speed/quality and she has not achieved maximal benefit from therapy services. Patient encouraged to continue HEP. She will benefit from continued skilled therapeutic intervention to address remaining deficits in BLE strength, balance, postural control, and gait speed/quality in order to maintain function, level of  independence, and QoL.    Personal Factors and Comorbidities Comorbidity 1;Past/Current Experience;Time since onset of injury/illness/exacerbation;Transportation;Social Background    Comorbidities spina bifida    Examination-Activity Limitations Bend;Carry;Continence;Lift;Reach Overhead;Squat;Stairs;Stand;Transfers    Examination-Participation Restrictions Church;Community Activity;Driving;Laundry;Meal Prep;Volunteer;Yard Work    Stability/Clinical Decision Making Stable/Uncomplicated    Rehab Potential Good    Clinical Impairments Affecting Rehab Potential weakness and decreased standing balance    PT Frequency Biweekly    PT Duration 12 weeks    PT Treatment/Interventions ADLs/Self Care Home Management;Aquatic Therapy;Biofeedback;Ultrasound;Moist Heat;Iontophoresis 60m/ml Dexamethasone;Electrical Stimulation;DME Instruction;Gait training;Stair training;Functional mobility training;Neuromuscular re-education;Balance training;Therapeutic exercise;Therapeutic activities;Patient/family education;Orthotic Fit/Training;Wheelchair mobility training;Manual techniques;Dry needling;Passive range of motion;Energy conservation;Splinting;Taping;Vestibular;Visual/perceptual remediation/compensation;Canalith Repostioning;Cryotherapy    PT Next Visit Plan review HEP, balance and strength exercises    PT Home Exercise Plan Seated, supine, and core program: [id number]    Consulted and Agree with Plan of Care Patient                Patient will benefit from skilled therapeutic intervention in order to improve the following deficits and impairments:  Abnormal gait, Decreased mobility, Decreased balance, Decreased coordination, Difficulty walking, Decreased strength, Impaired tone, Postural dysfunction, Improper body mechanics  Visit Diagnosis: Unsteadiness on feet  Muscle weakness (generalized)  Difficulty in walking, not elsewhere classified     Problem List There are no problems to display for  this patient.   JLyndel SafeHuprich PT, DPT, GCS  Michelle Randall 09/18/2021, 10:53 AM  [place] [facility] [place] [address]Phone: [phone]  Fax:  [phone] Name: Michelle PECINAMRN: [id number] of Birth: [birth year]

## 2021-10-01 ENCOUNTER — Ambulatory Visit: Payer: Medicaid Other

## 2021-10-15 ENCOUNTER — Other Ambulatory Visit: Payer: Self-pay

## 2021-10-15 ENCOUNTER — Ambulatory Visit: Payer: Medicaid Other | Attending: Student

## 2021-10-15 DIAGNOSIS — R262 Difficulty in walking, not elsewhere classified: Secondary | ICD-10-CM | POA: Insufficient documentation

## 2021-10-15 DIAGNOSIS — M6281 Muscle weakness (generalized): Secondary | ICD-10-CM | POA: Insufficient documentation

## 2021-10-15 DIAGNOSIS — R531 Weakness: Secondary | ICD-10-CM | POA: Insufficient documentation

## 2021-10-15 DIAGNOSIS — Z9181 History of falling: Secondary | ICD-10-CM | POA: Diagnosis present

## 2021-10-15 DIAGNOSIS — R2681 Unsteadiness on feet: Secondary | ICD-10-CM | POA: Insufficient documentation

## 2021-10-17 NOTE — Therapy (Signed)
[facility] [address] [place], [place], [address] Phone: [phone]   Fax:  [phone]  Physical Therapy Treatment    Patient Details  Name: Michelle Randall MRN: [id number] Date of Birth: 02/24/1996 Referring Provider (PT): Wayland Denis PA   Encounter Date: 10/15/2021   PT End of Session - 10/17/21 0948     Visit Number 22    Number of Visits 31    Date for PT Re-Evaluation 11/12/21    Authorization Type eval: 7/98/92, recert: 12/04/39, 7/40/81, 08/20/21    Authorization Time Period [facility] auth 11/21-12/31 3 PT visits  Medicaid  VL: 27 combined PT/OT/SLP per year  Auth: is required    Authorization - Visit Number 1    Authorization - Number of Visits 3    PT Start Time 4481    PT Stop Time 1750    PT Time Calculation (min) 45 min    Equipment Utilized During Treatment Gait belt;Other (comment)   Bilateral AFOs, lofstrand crutches   Activity Tolerance Patient tolerated treatment well    Behavior During Therapy WFL for tasks assessed/performed                 Past Medical History:  Diagnosis Date   Neurogenic bladder    Neurogenic bowel    S/P VP shunt    Spina bifida [facility])     Past Surgical History:  Procedure Laterality Date   VENTRICULOPERITONEAL SHUNT      There were no vitals filed for this visit.    Subjective Assessment - 10/17/21 0945     Subjective Patient denies any falls or significant pain since last session. No significant changes since the last therapy sessions. Patient presents with no questions or new concerns.    Pertinent History Patient is a pleasant 25 year old female who presents for continuation of care for generalized weakness/ coordination deficits secondary to diagnosis of spina bifida. PMH includes VP shunt, spina bifida, and neurogenic bowel/bladder. Patient was discharged from this clinic at the end of 2021 due to meeting therapy cap for insurance that year. Patient reports  that she has continued performing her HEP but notes a decline when she stops therapy due to inability to safely perform all of the balance and strength exercises as at home. One of the biggest challenge she is experiencing is stair navigation  She reports having multiple near falls when negotiating stairs at home. Patient must ascend/descend stairs to reach bedroom upstairs increasing her risk for falls daily.    Limitations Walking;Standing;House hold activities;Other (comment)   Stairs   How long can you sit comfortably? n/a    How long can you stand comfortably? require use of bilateral lofstrand crutches    How long can you walk comfortably? dependent upon terrain    Patient Stated Goals Patient wants to be able to negotiate uneven terrain, cross thresholds in home environment, and negotiate steps in household    Currently in Pain? No/denies                  TREATMENT    Ther-ex  NuStep BLE only L2 x 5 minutes for warm-up during interval history; Total Gym L22 BLE squats 2 x 20, (AFOs donned); Total Gym L22 double leg jumps 2 x 10 BLE (AFOs donned); Total Gym L22 single leg squats 2 x 10 BLE (AFOs donned); Sit to stand from regular height chair without UE support 2 x 10;   Neuromuscular Re-education  All exercises performed without UE support unless otherwise noted; Forward/backward gait in // bars x 4 lengths, cues for minimal BUE assistance; Lateral gait in // bars x 4 lengths, cues for minimal BUE assistance; Semitandem balance alternating forward LE x 30s each; Feet together eyes open static balance x 60s; Feet together eyes closed static balance x 60s; Education with patient and mother regarding plan of care, progress, and need for additional therapy; HEP importance reinforced;    Pt educated throughout session about proper posture and technique with exercises. Improved exercise technique, movement at target joints, use of target muscles after min to mod verbal,  visual, tactile cues.      Patient presents to clinic with excellent motivation to participate in therapy. Continued with strength and balance exercises with patient today. Pt has demonstrated good maintenance of balance and strength since working with therapy. However she demonstrates continued deficits in BLE strength, balance, postural control, and gait speed/quality. She has not received/achieved maximum benefit from physical therapy. She requires physical therapy intervention to maintain her functional status including LE strength, balance, and gait speed/quality and she has not achieved maximal benefit from therapy services. Patient encouraged to continue HEP. She will benefit from continued skilled therapeutic intervention to address remaining deficits in BLE strength, balance, postural control, and gait speed/quality in order to maintain function, level of independence, and QoL.        PT Short Term Goals - 09/18/21 0955       PT SHORT TERM GOAL #1   Title Patient will report compliance with HEP for continued strengthening and stability during functional mobility.     Time 6    Period Weeks    Status Achieved               PT Long Term Goals - 09/18/21 0955       PT LONG TERM GOAL #1   Title Patient will increase her FOTO score to at least 59 to demonstrate increased function and mobility for higher quality of life.    Baseline 12/11/20: 51; 03/02/21: 49; 06/11/21: 53; 08/20/21: 53    Time 12    Period Weeks    Status Partially Met    Target Date 11/12/21      PT LONG TERM GOAL #2   Title Patient will maintain Berg Balance score of at least 32/56 in order to demonstrate decreased fall risk during functional activities.    Baseline 12/11/20: 32/56; 03/02/21: 32/56; 06/11/21: 33/56; 08/20/2021: 34/56    Time 12    Period Weeks    Status On-going    Target Date 11/12/21      PT LONG TERM GOAL #3   Title Patient will be able to maintain safe performance with  ascending/descending a flight of stairs using lofstrand crutches and railing in order to allow for safe navigation of her home environment.    Baseline 12/11/20: currently able to use lofstrands and/or railing with supervision by family; 03/02/21: Good maintenance observed; 06/11/21: Good maintenance observed; 08/20/2021: Good maintenance observed    Time 12    Period Weeks    Status On-going    Target Date 11/12/21      PT LONG TERM GOAL #4   Title Patient will complete a TUG test in no more than 13.5 seconds for independent mobility and decreased fall risk maintenance    Baseline 12/11/20: 13.4s; 03/02/21: 13.5s; 06/11/21: 11.4s; 08/20/21: 12.35s    Time 12    Period [place]  Status On-going    Target Date 11/12/21      PT LONG TERM GOAL #5   Title Patient will increase fastest 10 meter walk test to >0.81 m/s as to improve gait speed for better community ambulation and to reduce fall risk.    Baseline 12/11/20: 14.6s = 0.68 m/s; 03/02/21: 14.3s = 0.70 m/s; 06/11/21: 13.2s = 0.76 m/s; 08/20/21: 13.5s= 0.74 m/s    Time 12    Period Weeks    Status Partially Met    Target Date 11/12/21                   Plan - 10/17/21 0946     Clinical Impression Statement Patient presents to clinic with excellent motivation to participate in therapy. Continued with strength and balance exercises with patient today. Pt has demonstrated good maintenance of balance and strength since working with therapy. However she demonstrates continued deficits in BLE strength, balance, postural control, and gait speed/quality. She has not received/achieved maximum benefit from physical therapy. She requires physical therapy intervention to maintain her functional status including LE strength, balance, and gait speed/quality and she has not achieved maximal benefit from therapy services. Patient encouraged to continue HEP. She will benefit from continued skilled therapeutic intervention to address remaining deficits in BLE  strength, balance, postural control, and gait speed/quality in order to maintain function, level of independence, and QoL.    Personal Factors and Comorbidities Comorbidity 1;Past/Current Experience;Time since onset of injury/illness/exacerbation;Transportation;Social Background    Comorbidities spina bifida    Examination-Activity Limitations Bend;Carry;Continence;Lift;Reach Overhead;Squat;Stairs;Stand;Transfers    Examination-Participation Restrictions Church;Community Activity;Driving;Laundry;Meal Prep;Volunteer;Yard Work    Stability/Clinical Decision Making Stable/Uncomplicated    Rehab Potential Good    Clinical Impairments Affecting Rehab Potential weakness and decreased standing balance    PT Frequency Biweekly    PT Duration 12 weeks    PT Treatment/Interventions ADLs/Self Care Home Management;Aquatic Therapy;Biofeedback;Ultrasound;Moist Heat;Iontophoresis 53m/ml Dexamethasone;Electrical Stimulation;DME Instruction;Gait training;Stair training;Functional mobility training;Neuromuscular re-education;Balance training;Therapeutic exercise;Therapeutic activities;Patient/family education;Orthotic Fit/Training;Wheelchair mobility training;Manual techniques;Dry needling;Passive range of motion;Energy conservation;Splinting;Taping;Vestibular;Visual/perceptual remediation/compensation;Canalith Repostioning;Cryotherapy    PT Next Visit Plan review HEP, balance and strength exercises    PT Home Exercise Plan Seated, supine, and core program: [id number]    Consulted and Agree with Plan of Care Patient                Patient will benefit from skilled therapeutic intervention in order to improve the following deficits and impairments:  Abnormal gait, Decreased mobility, Decreased balance, Decreased coordination, Difficulty walking, Decreased strength, Impaired tone, Postural dysfunction, Improper body mechanics  Visit Diagnosis: Unsteadiness on feet  Muscle weakness (generalized)  Difficulty  in walking, not elsewhere classified     Problem List There are no problems to display for this patient.   JLyndel SafeHuprich PT, DPT, GCS  Diana Davenport 10/17/2021, 11:03 AM  [place] [facility] [place] [address]Phone: [phone]  Fax:  [phone] Name: MANSHI JALLOHMRN: [id number] of Birth: [birth year]

## 2021-10-29 ENCOUNTER — Other Ambulatory Visit: Payer: Self-pay

## 2021-10-29 ENCOUNTER — Ambulatory Visit: Payer: Medicaid Other

## 2021-10-29 DIAGNOSIS — R262 Difficulty in walking, not elsewhere classified: Secondary | ICD-10-CM

## 2021-10-29 DIAGNOSIS — R531 Weakness: Secondary | ICD-10-CM

## 2021-10-29 DIAGNOSIS — M6281 Muscle weakness (generalized): Secondary | ICD-10-CM

## 2021-10-29 DIAGNOSIS — Z9181 History of falling: Secondary | ICD-10-CM

## 2021-10-29 DIAGNOSIS — R2681 Unsteadiness on feet: Secondary | ICD-10-CM

## 2021-10-29 NOTE — Therapy (Addendum)
[facility] [address] [place], [place], [address] Phone: [phone]   Fax:  [phone]  Physical Therapy Treatment  Patient Details  Name: Michelle Randall MRN: [id number] Date of Birth: 1996-04-30 Referring Provider (PT): Wayland Denis PA   Encounter Date: 10/29/2021   PT End of Session - 10/29/21 1416     Visit Number 23    Number of Visits 31    Date for PT Re-Evaluation 11/12/21    Authorization Type eval: 08/05/18, recert: 03/02/39, 06/29/47, 08/20/21    Authorization Time Period [employer] auth 11/21-12/31 3 PT visits  Medicaid  VL: 27 combined PT/OT/SLP per year  Auth: is required    Authorization - Visit Number 1    Authorization - Number of Visits 3    PT Start Time 1400    PT Stop Time 1446    PT Time Calculation (min) 46 min    Equipment Utilized During Treatment Gait belt;Other (comment)   Bilateral AFOs, lofstrand crutches   Activity Tolerance Patient tolerated treatment well    Behavior During Therapy WFL for tasks assessed/performed             Past Medical History:  Diagnosis Date   Neurogenic bladder    Neurogenic bowel    S/P VP shunt    Spina bifida [facility])     Past Surgical History:  Procedure Laterality Date   VENTRICULOPERITONEAL SHUNT      There were no vitals filed for this visit.   Subjective Assessment - 10/29/21 1414     Subjective Pt denies falls. reports minor sorenss after previous session.    Pertinent History Patient is a pleasant 25 year old female who presents for continuation of care for generalized weakness/ coordination deficits secondary to diagnosis of spina bifida. PMH includes VP shunt, spina bifida, and neurogenic bowel/bladder. Patient was discharged from this clinic at the end of 2021 due to meeting therapy cap for insurance that year. Patient reports that she has continued performing her HEP but notes a decline when she stops therapy due to inability to safely perform all of  the balance and strength exercises as at home. One of the biggest challenge she is experiencing is stair navigation  She reports having multiple near falls when negotiating stairs at home. Patient must ascend/descend stairs to reach bedroom upstairs increasing her risk for falls daily.    Limitations Walking;Standing;House hold activities;Other (comment)   Stairs   How long can you sit comfortably? n/a    How long can you stand comfortably? require use of bilateral lofstrand crutches    How long can you walk comfortably? dependent upon terrain    Patient Stated Goals Patient wants to be able to negotiate uneven terrain, cross thresholds in home environment, and negotiate steps in household    Currently in Pain? No/denies             There.ex:   NuStep BLE only L2 x 5 minutes for warm-up during interval history Total Gym L22 BLE squats 2 x 20, (AFOs donned) Total Gym L22 double leg jumps 2 x 10 BLE (AFOs donned) to improve power production Total Gym L22 single leg squats 2 x 10 BLE (AFOs donned)   BUE support alternating 6" step ups for power production. CGA,1x10/LE. Cuing for eccentric control with same leg for concentric portion of exercise.    Neuro Re-Ed: Supervision for all activity  All exercises performed without UE support unless otherwise noted.  Forward/backward gait  in // bars x 4 lengths, cues for attempting SUE support. Cuing for wider BOS for improved stability and gluteal activation for upright posture. Good carryover after cuing with use of mirror and blue line on floor for visual cues. Lateral gait in // bars x 4 lengths, cues for attempting SUE support Semitandem ambulation with SUE x4 laps    Pt attempts with no UE support but ultimately requires intermittent touching with BUE's. Regressed to consistent, light SUE support with improved carryover.   PT Education - 10/29/21 1415     Education provided Yes    Education Details form/technique with exercise     Person(s) Educated Patient    Methods Explanation;Demonstration    Comprehension Verbalized understanding;Returned demonstration              PT Short Term Goals - 09/18/21 0955       PT SHORT TERM GOAL #1   Title Patient will report compliance with HEP for continued strengthening and stability during functional mobility.     Time 6    Period Weeks    Status Achieved               PT Long Term Goals - 09/18/21 0955       PT LONG TERM GOAL #1   Title Patient will increase her FOTO score to at least 59 to demonstrate increased function and mobility for higher quality of life.    Baseline 12/11/20: 51; 03/02/21: 49; 06/11/21: 53; 08/20/21: 53    Time 12    Period Weeks    Status Partially Met    Target Date 11/12/21      PT LONG TERM GOAL #2   Title Patient will maintain Berg Balance score of at least 32/56 in order to demonstrate decreased fall risk during functional activities.    Baseline 12/11/20: 32/56; 03/02/21: 32/56; 06/11/21: 33/56; 08/20/2021: 34/56    Time 12    Period Weeks    Status On-going    Target Date 11/12/21      PT LONG TERM GOAL #3   Title Patient will be able to maintain safe performance with ascending/descending a flight of stairs using lofstrand crutches and railing in order to allow for safe navigation of her home environment.    Baseline 12/11/20: currently able to use lofstrands and/or railing with supervision by family; 03/02/21: Good maintenance observed; 06/11/21: Good maintenance observed; 08/20/2021: Good maintenance observed    Time 12    Period Weeks    Status On-going    Target Date 11/12/21      PT LONG TERM GOAL #4   Title Patient will complete a TUG test in no more than 13.5 seconds for independent mobility and decreased fall risk maintenance    Baseline 12/11/20: 13.4s; 03/02/21: 13.5s; 06/11/21: 11.4s; 08/20/21: 12.35s    Time 12    Period Weeks    Status On-going    Target Date 11/12/21      PT LONG TERM GOAL #5   Title Patient will  increase fastest 10 meter walk test to >0.81 m/s as to improve gait speed for better community ambulation and to reduce fall risk.    Baseline 12/11/20: 14.6s = 0.68 m/s; 03/02/21: 14.3s = 0.70 m/s; 06/11/21: 13.2s = 0.76 m/s; 08/20/21: 13.5s= 0.74 m/s    Time 12    Period Weeks    Status Partially Met    Target Date 11/12/21  Plan - 10/29/21 1416     Clinical Impression Statement Pt remains highly motivated to improve LE strength and balance. Does display deficits in strength with inability to complete full ranges of motion due to weakness in gravity reduced positions. Attempts made to progress balance exercises to progress to SUE with fair carryover. Does require frequent need for BUE touch support with progression to SUE but can perform with supervision. Will continue to benefit from skilled PT services to progress strength and balance to reduce risk of falls.    Personal Factors and Comorbidities Comorbidity 1;Past/Current Experience;Time since onset of injury/illness/exacerbation;Transportation;Social Background    Comorbidities spina bifida    Examination-Activity Limitations Bend;Carry;Continence;Lift;Reach Overhead;Squat;Stairs;Stand;Transfers    Examination-Participation Restrictions Church;Community Activity;Driving;Laundry;Meal Prep;Volunteer;Yard Work    Stability/Clinical Decision Making Stable/Uncomplicated    Rehab Potential Good    Clinical Impairments Affecting Rehab Potential weakness and decreased standing balance    PT Frequency Biweekly    PT Duration 12 weeks    PT Treatment/Interventions ADLs/Self Care Home Management;Aquatic Therapy;Biofeedback;Ultrasound;Moist Heat;Iontophoresis 62m/ml Dexamethasone;Electrical Stimulation;DME Instruction;Gait training;Stair training;Functional mobility training;Neuromuscular re-education;Balance training;Therapeutic exercise;Therapeutic activities;Patient/family education;Orthotic Fit/Training;Wheelchair mobility  training;Manual techniques;Dry needling;Passive range of motion;Energy conservation;Splinting;Taping;Vestibular;Visual/perceptual remediation/compensation;Canalith Repostioning;Cryotherapy    PT Next Visit Plan review HEP, balance and strength exercises    PT Home Exercise Plan Seated, supine, and core program: [id number]    Consulted and Agree with Plan of Care Patient             Patient will benefit from skilled therapeutic intervention in order to improve the following deficits and impairments:  Abnormal gait, Decreased mobility, Decreased balance, Decreased coordination, Difficulty walking, Decreased strength, Impaired tone, Postural dysfunction, Improper body mechanics  Visit Diagnosis: Unsteadiness on feet  Muscle weakness (generalized)  Difficulty in walking, not elsewhere classified  Weakness generalized  Personal history of fall     Problem List There are no problems to display for this patient.   MSalem Caster Fairly IV, PT, DPT Physical Therapist- [facility] 10/29/2021, 3:39 PM  [place] [facility] [place] [address]Phone: [phone]  Fax:  [phone] Name: Michelle GREENHOUSEMRN: [id number] of Birth: 908/18/1997

## 2021-11-12 ENCOUNTER — Other Ambulatory Visit: Payer: Self-pay

## 2021-11-12 ENCOUNTER — Ambulatory Visit: Payer: Medicaid Other

## 2021-11-12 DIAGNOSIS — M6281 Muscle weakness (generalized): Secondary | ICD-10-CM

## 2021-11-12 DIAGNOSIS — R2681 Unsteadiness on feet: Secondary | ICD-10-CM | POA: Diagnosis not present

## 2021-11-12 DIAGNOSIS — R262 Difficulty in walking, not elsewhere classified: Secondary | ICD-10-CM

## 2021-11-12 NOTE — Therapy (Addendum)
[facility] [address] [place], [place], [address] Phone: [phone]   Fax:  [phone]  Physical Therapy Treatment    Patient Details  Name: Michelle Randall MRN: [id number] Date of Birth: February 07, 1996 Referring Provider (PT): Wayland Denis PA    Encounter Date: 11/12/2021   PT End of Session - 11/12/21 1406     Visit Number 24    Number of Visits 37    Date for PT Re-Evaluation 02/04/22   Authorization Type eval: 0/76/22, recert: 6/33/35, 4/56/25, 08/20/21, 11/11/21    Authorization Time Period [employer] auth 11/21-12/31 3 PT visits  Medicaid  VL: 27 combined PT/OT/SLP per year  Auth: is required    Authorization - Visit Number 3    Authorization - Number of Visits 3    PT Start Time 1400    PT Stop Time 1445    PT Time Calculation (min) 45 min    Equipment Utilized During Treatment Gait belt;Other (comment)   Bilateral AFOs, lofstrand crutches   Activity Tolerance Patient tolerated treatment well    Behavior During Therapy WFL for tasks assessed/performed                 Past Medical History:  Diagnosis Date   Neurogenic bladder    Neurogenic bowel    S/P VP shunt    Spina bifida [facility])     Past Surgical History:  Procedure Laterality Date   VENTRICULOPERITONEAL SHUNT      There were no vitals filed for this visit.    Subjective Assessment - 11/12/21 1405     Subjective Pt reports that she is doing well. She denies falls since last session. No significant changes. She continues with her HEP. No specific questions or concerns.    Pertinent History Patient is a pleasant 25 year old female who presents for continuation of care for generalized weakness/ coordination deficits secondary to diagnosis of spina bifida. PMH includes VP shunt, spina bifida, and neurogenic bowel/bladder. Patient was discharged from this clinic at the end of 2021 due to meeting therapy cap for insurance that year. Patient reports that  she has continued performing her HEP but notes a decline when she stops therapy due to inability to safely perform all of the balance and strength exercises as at home. One of the biggest challenge she is experiencing is stair navigation  She reports having multiple near falls when negotiating stairs at home. Patient must ascend/descend stairs to reach bedroom upstairs increasing her risk for falls daily.    Limitations Walking;Standing;House hold activities;Other (comment)   Stairs   How long can you sit comfortably? n/a    How long can you stand comfortably? require use of bilateral lofstrand crutches    How long can you walk comfortably? dependent upon terrain    Patient Stated Goals Patient wants to be able to negotiate uneven terrain, cross thresholds in home environment, and negotiate steps in household    Currently in Pain? No/denies                 TREATMENT     Neuromuscular Re-education  Updated outcome measures and goals with patient today:     OUTCOME MEASURES:   TEST 12/11/20 03/02/21 06/11/21 08/20/21 11/12/21 Interpretation  5 times sit<>stand 10.6s 9.6s 8.1s Not Tested 7.4s WNL  10 meter walk test 14.6s = 0.68 m/s 14.3s = 0.70 m/s 13.2s =  0.76 m/s 13.5s=0.74 m/s 14.4s = 0.69 m/s <1.0 m/s indicates increased  risk for falls; limited community ambulator  Timed up and Go 13.4s 13.5s 11.4s 12.35s 12.2s WNL, use of lofstrand crutches, >14 sec indicates increased risk for falls  6 minute walk test Deferred Deferred Deferred Deferred Deferred 1000 feet is community ambulator  BERG Balance Assessment 32/56 32/56 33/56    34/56 34/56 <36/56 (100% risk for falls), 37-45 (80% risk for falls); 46-51 (>50% risk for falls); 52-55 (lower risk <25% of falls)  FOTO 51 49 53 53 49 Goal is to reach 59        6" alternating step-ups with BUE support x 10 on each side;  Feet together eyes closed balance x 30s; Staggered stance balance eyes open x 30s; Airex balance with feet together  and no UE support x 60s; Side stepping in // bars with BUE support x 4 lengths;     Pt educated throughout session about proper posture and technique with exercises. Improved exercise technique, movement at target joints, use of target muscles after min to mod verbal, visual, tactile cues.       Pt demonstrates excellent motivation during session today. Updated outcome measures and goals during visit today. Her TUG and BERG showed excellent maintenance of her function and her 5TSTS was 7.4s which is notably faster than where it started at 10.6s at the beginning of the year. She showed a slight decline in her 75mgait speed and FOTO scores from where they were when last tested. Overall pt shows nice maintenance of her functional outcome measures which is consistent with her goals for therapy. Discharge attempted at the end of 2021 and she demonstrated a drop in her function during her prolonged absence out of therapy despite good consistency with her HEP. Pt is regularly exercising with the assistance of family as needed/appropriate. She is unable to complete all necessary exercises safely at home either independently or with family assistance due to need from physical therapist to monitor and correct form and technique as well as to provide safe guarding. Pt requires physical therapy intervention to maintain her functional status including LE strength, balance, and gait speed/quality and she has not achieved maximal benefit from therapy services. She will benefit from continued PT services to address these deficits and maintain her function at home and school.               PT Short Term Goals - 11/13/21 1332       PT SHORT TERM GOAL #1   Title Patient will report compliance with HEP for continued strengthening and stability during functional mobility.     Time 6    Period Weeks    Status Achieved               PT Long Term Goals - 11/13/21 1332       PT LONG TERM GOAL #1    Title Patient will increase her FOTO score to at least 59 to demonstrate increased function and mobility for higher quality of life.    Baseline 12/11/20: 51; 03/02/21: 49; 06/11/21: 53; 08/20/21: 53; 11/12/21: 49    Time 12    Period Weeks    Status Partially Met    Target Date 02/04/22      PT LONG TERM GOAL #2   Title Patient will maintain Berg Balance score of at least 32/56 in order to demonstrate decreased fall risk during functional activities.    Baseline 12/11/20: 32/56; 03/02/21: 32/56; 06/11/21: 33/56; 08/20/2021: 34/56; 11/12/21: 34/56    Time 12  Period Weeks    Status On-going    Target Date 02/04/22      PT LONG TERM GOAL #3   Title Patient will be able to maintain safe performance with ascending/descending a flight of stairs using lofstrand crutches and railing in order to allow for safe navigation of her home environment.    Baseline 12/11/20: currently able to use lofstrands and/or railing with supervision by family; 03/02/21: Good maintenance observed; 06/11/21: Good maintenance observed; 08/20/2021: Good maintenance observed; 11/13/21: Good maintenance observed, family reports it continues to be the most challenging activity for patient    Time 12    Period Weeks    Status On-going    Target Date 02/04/22      PT LONG TERM GOAL #4   Title Patient will complete a TUG test in no more than 13.5 seconds for independent mobility and decreased fall risk maintenance    Baseline 12/11/20: 13.4s; 03/02/21: 13.5s; 06/11/21: 11.4s; 08/20/21: 12.35s; 11/12/21: 12.2s    Time 12    Period Weeks    Status On-going    Target Date 02/04/22      PT LONG TERM GOAL #5   Title Patient will increase fastest 10 meter walk test to >0.81 m/s as to improve gait speed for better community ambulation and to reduce fall risk.    Baseline 12/11/20: 14.6s = 0.68 m/s; 03/02/21: 14.3s = 0.70 m/s; 06/11/21: 13.2s = 0.76 m/s; 08/20/21: 13.5s= 0.74 m/s; 11/12/21: 14.4s = 0.69 m/s    Time 12    Period Weeks    Status  Partially Met    Target Date 02/04/22                   Plan - 11/13/21 1332     Clinical Impression Statement Pt demonstrates excellent motivation during session today. Updated outcome measures and goals during visit today. Her TUG and BERG showed excellent maintenance of her function and her 5TSTS was 7.4s which is notably faster than where it started at 10.6s at the beginning of the year. She showed a slight decline in her 73mgait speed and FOTO scores from where they were when last tested. Overall pt shows nice maintenance of her functional outcome measures which is consistent with her goals for therapy. Discharge attempted at the end of 2021 and she demonstrated a drop in her function during her prolonged absence out of therapy despite good consistency with her HEP. Pt is regularly exercising with the assistance of family as needed/appropriate. She is unable to complete all necessary exercises safely at home either independently or with family assistance due to need from physical therapist to monitor and correct form and technique as well as to provide safe guarding. Pt requires physical therapy intervention to maintain her functional status including LE strength, balance, and gait speed/quality and she has not achieved maximal benefit from therapy services. She will benefit from continued PT services to address these deficits and maintain her function at home and school.    Personal Factors and Comorbidities Comorbidity 1;Past/Current Experience;Time since onset of injury/illness/exacerbation;Transportation;Social Background    Comorbidities spina bifida    Examination-Activity Limitations Bend;Carry;Continence;Lift;Reach Overhead;Squat;Stairs;Stand;Transfers    Examination-Participation Restrictions Church;Community Activity;Driving;Laundry;Meal Prep;Volunteer;Yard Work    Stability/Clinical Decision Making Stable/Uncomplicated    Rehab Potential Good    Clinical Impairments Affecting  Rehab Potential weakness and decreased standing balance    PT Frequency Biweekly    PT Duration 12 weeks    PT Treatment/Interventions ADLs/Self Care Home Management;Aquatic Therapy;Biofeedback;Ultrasound;Moist  Heat;Iontophoresis 80m/ml Dexamethasone;Electrical Stimulation;DME Instruction;Gait training;Stair training;Functional mobility training;Neuromuscular re-education;Balance training;Therapeutic exercise;Therapeutic activities;Patient/family education;Orthotic Fit/Training;Wheelchair mobility training;Manual techniques;Dry needling;Passive range of motion;Energy conservation;Splinting;Taping;Vestibular;Visual/perceptual remediation/compensation;Canalith Repostioning;Cryotherapy    PT Next Visit Plan review HEP, balance and strength exercises    PT Home Exercise Plan Seated, supine, and core program: [id number]    Consulted and Agree with Plan of Care Patient                 Patient will benefit from skilled therapeutic intervention in order to improve the following deficits and impairments:  Abnormal gait, Decreased mobility, Decreased balance, Decreased coordination, Difficulty walking, Decreased strength, Impaired tone, Postural dysfunction, Improper body mechanics  Visit Diagnosis: Unsteadiness on feet  Muscle weakness (generalized)  Difficulty in walking, not elsewhere classified     Problem List There are no problems to display for this patient.    JPhillips GroutPT, DPT, GCS  Deon Duer 11/13/2021, 1:44 PM  [facility] [place] [address]Phone: [phone]  Fax:  [phone] Name: Michelle WEYANDTMRN: [id number] of Birth: 9March 27, 1997

## 2021-11-17 NOTE — Addendum Note (Signed)
Addended by: Ria Comment D on: 11/17/2021 10:03 AM   Modules accepted: Orders

## 2021-11-24 ENCOUNTER — Other Ambulatory Visit: Payer: Self-pay

## 2021-11-24 ENCOUNTER — Ambulatory Visit: Payer: Medicaid Other | Attending: Student

## 2021-11-24 DIAGNOSIS — R279 Unspecified lack of coordination: Secondary | ICD-10-CM | POA: Diagnosis present

## 2021-11-24 DIAGNOSIS — R2681 Unsteadiness on feet: Secondary | ICD-10-CM | POA: Diagnosis present

## 2021-11-24 DIAGNOSIS — M6281 Muscle weakness (generalized): Secondary | ICD-10-CM | POA: Insufficient documentation

## 2021-11-24 DIAGNOSIS — R531 Weakness: Secondary | ICD-10-CM | POA: Insufficient documentation

## 2021-11-24 NOTE — Therapy (Incomplete)
[facility] [address] [place], [place], [address] Phone: [phone]   Fax:  [phone]  Physical Therapy Treatment    Patient Details  Name: Michelle Randall MRN: [id number] Date of Birth: 04-10-1996 Referring Provider (PT): Wayland Denis PA    Encounter Date: 11/24/2021   PT End of Session - 11/12/21 1406     Visit Number 24    Number of Visits 37    Date for PT Re-Evaluation 02/04/22   Authorization Type eval: 6/96/29, recert: 04/11/40, 02/06/39, 08/20/21, 11/11/21    Authorization Time Period [employer] auth 11/21-12/31 3 PT visits  Medicaid  VL: 27 combined PT/OT/SLP per year  Auth: is required    Authorization - Visit Number 3    Authorization - Number of Visits 3    PT Start Time 1400    PT Stop Time 1445    PT Time Calculation (min) 45 min    Equipment Utilized During Treatment Gait belt;Other (comment)   Bilateral AFOs, lofstrand crutches   Activity Tolerance Patient tolerated treatment well    Behavior During Therapy WFL for tasks assessed/performed                 Past Medical History:  Diagnosis Date   Neurogenic bladder    Neurogenic bowel    S/P VP shunt    Spina bifida [facility])     Past Surgical History:  Procedure Laterality Date   VENTRICULOPERITONEAL SHUNT      There were no vitals filed for this visit.          TREATMENT   Ther-ex  NuStep BLE only L2 x 5 minutes for warm-up during interval history; Total Gym L22 BLE squats 2 x 20, (AFOs donned); Total Gym L22 single leg squats 2 x 15 BLE (AFOs donned); Total Gym L22 double leg jumps 2 x 15 BLE (AFOs donned);  6" alternating step-ups with BUE support x 10 on each side;  Sit to stand from regular height chair without UE support 2 x 10;   Neuromuscular Re-education  6" alternating step taps with BUE support; Forward/backward gait in // bars x 4 lengths, with 2# ankle weights cues for minimal BUE assistance; Lateral gait in // bars  x 4 lengths, with 2# ankle weights cues for minimal BUE assistance; Semitandem balance alternating forward LE x 30s each; Feet together eyes open static balance x 60s; Feet together eyes closed static balance x 60s; Education with patient and mother regarding plan of care, progress, and need for additional therapy; HEP importance reinforced;    Neuromuscular Re-education  Updated outcome measures and goals with patient today:         Feet together eyes closed balance x 30s; Staggered stance balance eyes open x 30s; Airex balance with feet together and no UE support x 60s; Side stepping in // bars with BUE support x 4 lengths;     Pt educated throughout session about proper posture and technique with exercises. Improved exercise technique, movement at target joints, use of target muscles after min to mod verbal, visual, tactile cues.       Pt demonstrates excellent motivation during session today. Updated outcome measures and goals during visit today. Her TUG and BERG showed excellent maintenance of her function and her 5TSTS was 7.4s which is notably faster than where it started at 10.6s at the beginning of the year. She showed a slight decline in her 53m gait speed and FOTO scores from where  they were when last tested. Overall pt shows nice maintenance of her functional outcome measures which is consistent with her goals for therapy. Discharge attempted at the end of 2021 and she demonstrated a drop in her function during her prolonged absence out of therapy despite good consistency with her HEP. Pt is regularly exercising with the assistance of family as needed/appropriate. She is unable to complete all necessary exercises safely at home either independently or with family assistance due to need from physical therapist to monitor and correct form and technique as well as to provide safe guarding. Pt requires physical therapy intervention to maintain her functional status including LE  strength, balance, and gait speed/quality and she has not achieved maximal benefit from therapy services. She will benefit from continued PT services to address these deficits and maintain her function at home and school.               PT Short Term Goals - 11/13/21 1332       PT SHORT TERM GOAL #1   Title Patient will report compliance with HEP for continued strengthening and stability during functional mobility.     Time 6    Period Weeks    Status Achieved               PT Long Term Goals - 11/13/21 1332       PT LONG TERM GOAL #1   Title Patient will increase her FOTO score to at least 59 to demonstrate increased function and mobility for higher quality of life.    Baseline 12/11/20: 51; 03/02/21: 49; 06/11/21: 53; 08/20/21: 53; 11/12/21: 49    Time 12    Period Weeks    Status Partially Met    Target Date 02/04/22      PT LONG TERM GOAL #2   Title Patient will maintain Berg Balance score of at least 32/56 in order to demonstrate decreased fall risk during functional activities.    Baseline 12/11/20: 32/56; 03/02/21: 32/56; 06/11/21: 33/56; 08/20/2021: 34/56; 11/12/21: 34/56    Time 12    Period Weeks    Status On-going    Target Date 02/04/22      PT LONG TERM GOAL #3   Title Patient will be able to maintain safe performance with ascending/descending a flight of stairs using lofstrand crutches and railing in order to allow for safe navigation of her home environment.    Baseline 12/11/20: currently able to use lofstrands and/or railing with supervision by family; 03/02/21: Good maintenance observed; 06/11/21: Good maintenance observed; 08/20/2021: Good maintenance observed; 11/13/21: Good maintenance observed, family reports it continues to be the most challenging activity for patient    Time 12    Period Weeks    Status On-going    Target Date 02/04/22      PT LONG TERM GOAL #4   Title Patient will complete a TUG test in no more than 13.5 seconds for independent  mobility and decreased fall risk maintenance    Baseline 12/11/20: 13.4s; 03/02/21: 13.5s; 06/11/21: 11.4s; 08/20/21: 12.35s; 11/12/21: 12.2s    Time 12    Period Weeks    Status On-going    Target Date 02/04/22      PT LONG TERM GOAL #5   Title Patient will increase fastest 10 meter walk test to >0.81 m/s as to improve gait speed for better community ambulation and to reduce fall risk.    Baseline 12/11/20: 14.6s = 0.68 m/s; 03/02/21: 14.3s = 0.70 m/s;  06/11/21: 13.2s = 0.76 m/s; 08/20/21: 13.5s= 0.74 m/s; 11/12/21: 14.4s = 0.69 m/s    Time 12    Period Weeks    Status Partially Met    Target Date 02/04/22                         Patient will benefit from skilled therapeutic intervention in order to improve the following deficits and impairments:     Visit Diagnosis: No diagnosis found.     Problem List There are no problems to display for this patient.    Phillips Grout PT, DPT, GCS  Jermiah Soderman 11/24/2021, 4:34 PM  [facility] [address] [place], [place], [address] Phone: [phone]   Fax:  [phone]  Name: LISA MILIAN MRN: [id number] Date of Birth: 04/27/1996

## 2021-11-25 NOTE — Therapy (Signed)
[facility] [address]. [place], [place], [address] Phone: [phone]   Fax:  [phone]  Physical Therapy Treatment  Patient Details  Name: VERONICA FRETZ MRN: [id number] Date of Birth: 05-11-1996 Referring Provider (PT): Wayland Denis PA   Encounter Date: 11/24/2021   PT End of Session - 11/25/21 0830     Visit Number 26    Number of Visits 37    Date for PT Re-Evaluation 02/04/22    Authorization Type eval: 9/41/74, recert: 0/81/44, 07/03/55, 08/20/21, 11/11/21    Authorization Time Period [employer] auth 11/23/21-12/23/21 3 PT visits, Medicaid  VL: 27 combined PT/OT/SLP per year  Auth: is required    Authorization - Visit Number 1    Authorization - Number of Visits 3    PT Start Time 3149    PT Stop Time 1700    PT Time Calculation (min) 45 min    Equipment Utilized During Treatment Gait belt;Other (comment)   Bilateral AFOs, lofstrand crutches   Activity Tolerance Patient tolerated treatment well    Behavior During Therapy WFL for tasks assessed/performed             Past Medical History:  Diagnosis Date   Neurogenic bladder    Neurogenic bowel    S/P VP shunt    Spina bifida [facility])     Past Surgical History:  Procedure Laterality Date   VENTRICULOPERITONEAL SHUNT      There were no vitals filed for this visit.   Subjective Assessment - 11/25/21 0815     Subjective Pt reports that she is doing well. She denies falls since last session. Power chair and stair lift are working well. No significant changes. She continues with her HEP. No specific questions or concerns.    Pertinent History Patient is a pleasant 26 year old female who presents for continuation of care for generalized weakness/ coordination deficits secondary to diagnosis of spina bifida. PMH includes VP shunt, spina bifida, and neurogenic bowel/bladder. Patient was discharged from this clinic at the end of 2021 due to meeting therapy cap for insurance  that year. Patient reports that she has continued performing her HEP but notes a decline when she stops therapy due to inability to safely perform all of the balance and strength exercises as at home. One of the biggest challenge she is experiencing is stair navigation  She reports having multiple near falls when negotiating stairs at home. Patient must ascend/descend stairs to reach bedroom upstairs increasing her risk for falls daily.    Limitations Walking;Standing;House hold activities;Other (comment)   Stairs   How long can you sit comfortably? n/a    How long can you stand comfortably? require use of bilateral lofstrand crutches    How long can you walk comfortably? dependent upon terrain    Patient Stated Goals Patient wants to be able to negotiate uneven terrain, cross thresholds in home environment, and negotiate steps in household    Currently in Pain? No/denies                TREATMENT    Ther-ex  NuStep BLE only L2 x 6 minutes for warm-up during interval history; Total Gym L22 BLE squats 2 x 20, (AFOs donned); Total Gym L22 single leg squats 2 x 15 BLE (AFOs donned); Total Gym L22 double leg jumps 2 x 15 BLE (AFOs donned); 6" alternating step-ups with BUE support x 10 on each side;  Sit to stand from regular height chair  without UE support 2 x 10;   Neuromuscular Re-education  6" alternating step taps with BUE support; Forward/backward gait in // bars x 4 lengths, with 2# ankle weights cues for minimal BUE assistance; Lateral gait in // bars x 4 lengths, with 2# ankle weights cues for minimal BUE assistance; Semitandem balance alternating forward LE x 30s each, no UE support; Feet together eyes open static balance x 30s, no UE support; Feet together eyes closed static balance x 30s, no UE support; Feet together eyes open horizontal and vertical head turns x 30s each, no UE support; HEP importance reinforced;      Pt educated throughout session about proper posture  and technique with exercises. Improved exercise technique, movement at target joints, use of target muscles after min to mod verbal, visual, tactile cues.       Pt demonstrates excellent motivation during session today. Continued with strengthening as well as balance exercises with focus on activities that require the direction of physical therapy for proper technique and safety. Continued to work on progressing repetitions for squats and jumps on Total Gym to progress endurance and power of BLE. She is regularly exercising with the assistance of family as needed/appropriate. She is unable to complete all necessary exercises safely at home either independently or with family assistance due to need from physical therapist to monitor and correct form and technique as well as to provide safe guarding. Pt requires physical therapy intervention to maintain her functional status including LE strength, balance, and gait speed/quality and she has not achieved maximal benefit from therapy services. She will benefit from continued PT services to address these deficits and maintain her function at home and school.                              PT Short Term Goals - 11/13/21 1332       PT SHORT TERM GOAL #1   Title Patient will report compliance with HEP for continued strengthening and stability during functional mobility.     Time 6    Period Weeks    Status Achieved               PT Long Term Goals - 11/13/21 1332       PT LONG TERM GOAL #1   Title Patient will increase her FOTO score to at least 59 to demonstrate increased function and mobility for higher quality of life.    Baseline 12/11/20: 51; 03/02/21: 49; 06/11/21: 53; 08/20/21: 53; 11/12/21: 49    Time 12    Period Weeks    Status Partially Met    Target Date 11/12/21      PT LONG TERM GOAL #2   Title Patient will maintain Berg Balance score of at least 32/56 in order to demonstrate decreased fall risk during  functional activities.    Baseline 12/11/20: 32/56; 03/02/21: 32/56; 06/11/21: 33/56; 08/20/2021: 34/56; 11/12/21: 34/56    Time 12    Period Weeks    Status On-going    Target Date 11/12/21      PT LONG TERM GOAL #3   Title Patient will be able to maintain safe performance with ascending/descending a flight of stairs using lofstrand crutches and railing in order to allow for safe navigation of her home environment.    Baseline 12/11/20: currently able to use lofstrands and/or railing with supervision by family; 03/02/21: Good maintenance observed; 06/11/21: Good maintenance observed; 08/20/2021: Kermit Balo  maintenance observed; 11/13/21: Good maintenance observed, family reports it continues to be the most challenging activity for patient    Time 12    Period Weeks    Status On-going    Target Date 11/12/21      PT LONG TERM GOAL #4   Title Patient will complete a TUG test in no more than 13.5 seconds for independent mobility and decreased fall risk maintenance    Baseline 12/11/20: 13.4s; 03/02/21: 13.5s; 06/11/21: 11.4s; 08/20/21: 12.35s; 11/12/21: 12.2s    Time 12    Period Weeks    Status On-going    Target Date 11/12/21      PT LONG TERM GOAL #5   Title Patient will increase fastest 10 meter walk test to >0.81 m/s as to improve gait speed for better community ambulation and to reduce fall risk.    Baseline 12/11/20: 14.6s = 0.68 m/s; 03/02/21: 14.3s = 0.70 m/s; 06/11/21: 13.2s = 0.76 m/s; 08/20/21: 13.5s= 0.74 m/s; 11/12/21: 14.4s = 0.69 m/s    Time 12    Period Weeks    Status Partially Met    Target Date 11/12/21                   Plan - 11/25/21 0842     Clinical Impression Statement Pt demonstrates excellent motivation during session today. Continued with strengthening as well as balance exercises with focus on activities that require the direction of physical therapy for proper technique and safety. Continued to work on progressing repetitions for squats and jumps on Total Gym to  progress endurance and power of BLE. She is regularly exercising with the assistance of family as needed/appropriate. She is unable to complete all necessary exercises safely at home either independently or with family assistance due to need from physical therapist to monitor and correct form and technique as well as to provide safe guarding. Pt requires physical therapy intervention to maintain her functional status including LE strength, balance, and gait speed/quality and she has not achieved maximal benefit from therapy services. She will benefit from continued PT services to address these deficits and maintain her function at home and school.    Personal Factors and Comorbidities Comorbidity 1;Past/Current Experience;Time since onset of injury/illness/exacerbation;Transportation;Social Background    Comorbidities spina bifida    Examination-Activity Limitations Bend;Carry;Continence;Lift;Reach Overhead;Squat;Stairs;Stand;Transfers    Examination-Participation Restrictions Church;Community Activity;Driving;Laundry;Meal Prep;Volunteer;Yard Work    Stability/Clinical Decision Making Stable/Uncomplicated    Rehab Potential Good    Clinical Impairments Affecting Rehab Potential weakness and decreased standing balance    PT Frequency Biweekly    PT Duration 12 weeks    PT Treatment/Interventions ADLs/Self Care Home Management;Aquatic Therapy;Biofeedback;Ultrasound;Moist Heat;Iontophoresis 7m/ml Dexamethasone;Electrical Stimulation;DME Instruction;Gait training;Stair training;Functional mobility training;Neuromuscular re-education;Balance training;Therapeutic exercise;Therapeutic activities;Patient/family education;Orthotic Fit/Training;Wheelchair mobility training;Manual techniques;Dry needling;Passive range of motion;Energy conservation;Splinting;Taping;Vestibular;Visual/perceptual remediation/compensation;Canalith Repostioning;Cryotherapy    PT Next Visit Plan review HEP, balance and strength exercises     PT Home Exercise Plan Seated, supine, and core program: [id number]    Consulted and Agree with Plan of Care Patient             Patient will benefit from skilled therapeutic intervention in order to improve the following deficits and impairments:  Abnormal gait, Decreased mobility, Decreased balance, Decreased coordination, Difficulty walking, Decreased strength, Impaired tone, Postural dysfunction, Improper body mechanics  Visit Diagnosis: Unsteadiness on feet  Muscle weakness (generalized)     Problem List There are no problems to display for this patient.  JLyndel SafeHuprich PT, DPT, GCS  Azoria Abbett, PT 11/25/2021, 9:27 AM  [facility] [address] [place], [place], [address] Phone: [phone]   Fax:  [phone]  Name: JALAYSIA LOBB MRN: [id number] Date of Birth: 08-30-1996

## 2021-12-07 ENCOUNTER — Ambulatory Visit: Payer: Medicaid Other

## 2021-12-07 ENCOUNTER — Other Ambulatory Visit: Payer: Self-pay

## 2021-12-07 DIAGNOSIS — R531 Weakness: Secondary | ICD-10-CM

## 2021-12-07 DIAGNOSIS — R2681 Unsteadiness on feet: Secondary | ICD-10-CM

## 2021-12-07 DIAGNOSIS — M6281 Muscle weakness (generalized): Secondary | ICD-10-CM

## 2021-12-07 DIAGNOSIS — R279 Unspecified lack of coordination: Secondary | ICD-10-CM

## 2021-12-07 NOTE — Therapy (Addendum)
[facility] [address]. [place], [place], [address] Phone: [phone]   Fax:  [phone]  Physical Therapy Treatment  Patient Details  Name: Michelle Randall MRN: [id number] Date of Birth: [birth year] Referring Provider (PT): Wayland Denis PA   Encounter Date: 12/07/2021   PT End of Session - 12/07/21 1622     Visit Number 27    Number of Visits 37    Date for PT Re-Evaluation 02/04/22    Authorization Type eval: 0/07/12, recert: 1/97/58, 8/32/54, 08/20/21, 11/11/21    Authorization Time Period [employer] auth 11/23/21-12/23/21 3 PT visits, Medicaid  VL: 27 combined PT/OT/SLP per year  Auth: is required    Authorization - Visit Number 2    Authorization - Number of Visits 3    PT Start Time 9826    PT Stop Time 4158    PT Time Calculation (min) 43 min    Equipment Utilized During Treatment Gait belt;Other (comment)   Bilateral AFOs, lofstrand crutches   Activity Tolerance Patient tolerated treatment well    Behavior During Therapy WFL for tasks assessed/performed             Past Medical History:  Diagnosis Date   Neurogenic bladder    Neurogenic bowel    S/P VP shunt    Spina bifida [facility]     Past Surgical History:  Procedure Laterality Date   VENTRICULOPERITONEAL SHUNT      There were no vitals filed for this visit.   Subjective Assessment - 12/07/21 1617     Subjective Pt reports that she is doing well. She denies any significant changes since the last therapy session. No pain upon arrival. She continues with her HEP and reports no issues.    Pertinent History Patient is a pleasant 26 year old female who presents for continuation of care for generalized weakness/ coordination deficits secondary to diagnosis of spina bifida. PMH includes VP shunt, spina bifida, and neurogenic bowel/bladder. Patient was discharged from this clinic at the end of 2021 due to meeting therapy cap for insurance that year. Patient reports that  she has continued performing her HEP but notes a decline when she stops therapy due to inability to safely perform all of the balance and strength exercises as at home. One of the biggest challenge she is experiencing is stair navigation  She reports having multiple near falls when negotiating stairs at home. Patient must ascend/descend stairs to reach bedroom upstairs increasing her risk for falls daily.    Limitations Walking;Standing;House hold activities;Other (comment)   Stairs   How long can you sit comfortably? n/a    How long can you stand comfortably? require use of bilateral lofstrand crutches    How long can you walk comfortably? dependent upon terrain    Patient Stated Goals Patient wants to be able to negotiate uneven terrain, cross thresholds in home environment, and negotiate steps in household    Currently in Pain? No/denies                TREATMENT    Ther-ex  NuStep BLE only L2 x 6 minutes for warm-up during interval history; Total Gym L22 BLE squats 2 x 20, (AFOs donned); Total Gym L22 single leg squats 2 x 15 BLE (AFOs donned); Total Gym L22 double leg jumps 2 x 15 BLE (AFOs donned); 6" alternating step-ups on staircase with BUE support 2 x 10 on each side;  Resisted Forward and backwards walking in // bars  with one black theraband tube x 4 each direction Resisted lateral walking in // bars with one black theraband tube x 4 each direction   Neuromuscular Re-education  Feet together eyes open static balance x 30s, no UE support; Feet together eyes closed static balance x 30s, no UE support; Feet apart eyes open static balance on airex pad x 30s no UE support    Pt educated throughout session about proper posture and technique with exercises. Improved exercise technique, movement at target joints, use of target muscles after min to mod verbal, visual, tactile cues.     Pt demonstrates excellent motivation during today's session. Continued with strengthening  and balance exercises. Worked in a few exercises that combined strengthening and balance to better simulate community ambulation. Continued to work on progressing squats and jumps on the Total Gym with direction and assistance of physical therapist for proper technique and safety. She is still regularly exercising at home with the assistance of family. Pt will benefit from continued therapy as she is not able to complete all necessary exercises safely at home even with family assistance. Pt requires PT sessions to continue to maintain her functional status of living by continue to work on her LE strength, balance and gait.                                PT Short Term Goals - 11/13/21 1332       PT SHORT TERM GOAL #1   Title Patient will report compliance with HEP for continued strengthening and stability during functional mobility.     Time 6    Period Weeks    Status Achieved               PT Long Term Goals - 11/13/21 1332       PT LONG TERM GOAL #1   Title Patient will increase her FOTO score to at least 59 to demonstrate increased function and mobility for higher quality of life.    Baseline 12/11/20: 51; 03/02/21: 49; 06/11/21: 53; 08/20/21: 53; 11/12/21: 49    Time 12    Period Weeks    Status Partially Met    Target Date 11/12/21      PT LONG TERM GOAL #2   Title Patient will maintain Berg Balance score of at least 32/56 in order to demonstrate decreased fall risk during functional activities.    Baseline 12/11/20: 32/56; 03/02/21: 32/56; 06/11/21: 33/56; 08/20/2021: 34/56; 11/12/21: 34/56    Time 12    Period Weeks    Status On-going    Target Date 11/12/21      PT LONG TERM GOAL #3   Title Patient will be able to maintain safe performance with ascending/descending a flight of stairs using lofstrand crutches and railing in order to allow for safe navigation of her home environment.    Baseline 12/11/20: currently able to use lofstrands and/or railing  with supervision by family; 03/02/21: Good maintenance observed; 06/11/21: Good maintenance observed; 08/20/2021: Good maintenance observed; 11/13/21: Good maintenance observed, family reports it continues to be the most challenging activity for patient    Time 12    Period Weeks    Status On-going    Target Date 11/12/21      PT LONG TERM GOAL #4   Title Patient will complete a TUG test in no more than 13.5 seconds for independent mobility and decreased fall risk maintenance  Baseline 12/11/20: 13.4s; 03/02/21: 13.5s; 06/11/21: 11.4s; 08/20/21: 12.35s; 11/12/21: 12.2s    Time 12    Period Weeks    Status On-going    Target Date 11/12/21      PT LONG TERM GOAL #5   Title Patient will increase fastest 10 meter walk test to >0.81 m/s as to improve gait speed for better community ambulation and to reduce fall risk.    Baseline 12/11/20: 14.6s = 0.68 m/s; 03/02/21: 14.3s = 0.70 m/s; 06/11/21: 13.2s = 0.76 m/s; 08/20/21: 13.5s= 0.74 m/s; 11/12/21: 14.4s = 0.69 m/s    Time 12    Period Weeks    Status Partially Met    Target Date 11/12/21                   Plan - 12/07/21 1624     Clinical Impression Statement Pt demonstrates excellent motivation during today's session. Continued with strengthening and balance exercises. Worked in a few exercises that combined strengthening and balance to better simulate community ambulation. Continued to work on progressing squats and jumps on the Total Gym with direction and assistance of physical therapist for proper technique and safety. She is still regularly exercising at home with the assistance of family. Pt will benefit from continued therapy as she is not able to complete all necessary exercises safely at home even with family assistance. Pt requires PT sessions to continue to maintain her functional status of living by continue to work on her LE strength, balance and gait.    Personal Factors and Comorbidities Comorbidity 1;Past/Current  Experience;Time since onset of injury/illness/exacerbation;Transportation;Social Background    Comorbidities spina bifida    Examination-Activity Limitations Bend;Carry;Continence;Lift;Reach Overhead;Squat;Stairs;Stand;Transfers    Examination-Participation Restrictions Church;Community Activity;Driving;Laundry;Meal Prep;Volunteer;Yard Work    Stability/Clinical Decision Making Stable/Uncomplicated    Rehab Potential Good    Clinical Impairments Affecting Rehab Potential weakness and decreased standing balance    PT Frequency Biweekly    PT Duration 12 weeks    PT Treatment/Interventions ADLs/Self Care Home Management;Aquatic Therapy;Biofeedback;Ultrasound;Moist Heat;Iontophoresis 15m/ml Dexamethasone;Electrical Stimulation;DME Instruction;Gait training;Stair training;Functional mobility training;Neuromuscular re-education;Balance training;Therapeutic exercise;Therapeutic activities;Patient/family education;Orthotic Fit/Training;Wheelchair mobility training;Manual techniques;Dry needling;Passive range of motion;Energy conservation;Splinting;Taping;Vestibular;Visual/perceptual remediation/compensation;Canalith Repostioning;Cryotherapy    PT Next Visit Plan Update outcome measures/goals, review HEP, balance and strength exercises    PT Home Exercise Plan Seated, supine, and core program: [id number]    Consulted and Agree with Plan of Care Patient             Patient will benefit from skilled therapeutic intervention in order to improve the following deficits and impairments:  Abnormal gait, Decreased mobility, Decreased balance, Decreased coordination, Difficulty walking, Decreased strength, Impaired tone, Postural dysfunction, Improper body mechanics  Visit Diagnosis: Muscle weakness (generalized)  Unsteadiness on feet  Weakness generalized  Lack of coordination     Problem List There are no problems to display for this patient.  JPhillips GroutPT, DPT, GCS  Michelle Randall  SPT Huprich,Michelle Randall, PT 12/08/2021, 10:29 AM  [place] [facility] [place] [address]Phone: [phone]  Fax:  [phone] Name: Michelle FLITTONMRN: [id number] of Birth: 901-23-1997

## 2021-12-21 ENCOUNTER — Ambulatory Visit: Payer: Medicaid Other | Attending: Student

## 2021-12-21 ENCOUNTER — Other Ambulatory Visit: Payer: Self-pay

## 2021-12-21 DIAGNOSIS — R2681 Unsteadiness on feet: Secondary | ICD-10-CM | POA: Diagnosis present

## 2021-12-21 DIAGNOSIS — M6281 Muscle weakness (generalized): Secondary | ICD-10-CM | POA: Diagnosis not present

## 2021-12-21 NOTE — Therapy (Signed)
[facility] [address] [place], [place], [address] Phone: [phone]   Fax:  [phone]  Physical Therapy Treatment/Goal Update  Patient Details  Name: SUELLYN MEENAN MRN: [id number] Date of Birth: Jun 01, 1996 No data recorded   Encounter Date: 12/21/2021   PT End of Session - 12/22/21 1207     Visit Number 28    Number of Visits 37    Date for PT Re-Evaluation 02/04/22    Authorization Type eval: 3/66/29, recert: 4/76/54, 6/50/35, 08/20/21, 11/11/21    Authorization Time Period [employer] auth 11/23/21-12/23/21 3 PT visits, Medicaid  VL: 27 combined PT/OT/SLP per year  Auth: is required    Authorization - Visit Number 3    Authorization - Number of Visits 3    PT Start Time 1620    PT Stop Time 1705    PT Time Calculation (min) 45 min    Equipment Utilized During Treatment Gait belt;Other (comment)   Bilateral AFOs, lofstrand crutches   Activity Tolerance Patient tolerated treatment well    Behavior During Therapy WFL for tasks assessed/performed              Past Medical History:  Diagnosis Date   Neurogenic bladder    Neurogenic bowel    S/P VP shunt    Spina bifida [facility])     Past Surgical History:  Procedure Laterality Date   VENTRICULOPERITONEAL SHUNT      There were no vitals filed for this visit.   Subjective Assessment - 12/22/21 1205     Subjective Pt reports that she is doing well. She denies any significant changes since the last therapy session. No pain upon arrival. She continues with her HEP and reports no issues. No specific questions or concerns currently.    Pertinent History Patient is a pleasant 26 year old female who presents for continuation of care for generalized weakness/ coordination deficits secondary to diagnosis of spina bifida. PMH includes VP shunt, spina bifida, and neurogenic bowel/bladder. Patient was discharged from this clinic at the end of 2021 due to meeting therapy cap for insurance  that year. Patient reports that she has continued performing her HEP but notes a decline when she stops therapy due to inability to safely perform all of the balance and strength exercises as at home. One of the biggest challenge she is experiencing is stair navigation  She reports having multiple near falls when negotiating stairs at home. Patient must ascend/descend stairs to reach bedroom upstairs increasing her risk for falls daily.    Limitations Walking;Standing;House hold activities;Other (comment)   Stairs   How long can you sit comfortably? n/a    How long can you stand comfortably? require use of bilateral lofstrand crutches    How long can you walk comfortably? dependent upon terrain    Patient Stated Goals Patient wants to be able to negotiate uneven terrain, cross thresholds in home environment, and negotiate steps in household    Currently in Pain? No/denies                 [facility] PT Assessment - 12/21/21 1638       Standardized Balance Assessment   Standardized Balance Assessment Berg Balance Test      Berg Balance Test   Sit to Stand Able to stand without using hands and stabilize independently    Standing Unsupported Able to stand safely 2 minutes    Sitting with Back Unsupported but Feet Supported on Floor or  Stool Able to sit safely and securely 2 minutes    Stand to Sit Controls descent by using hands    Transfers Able to transfer safely, definite need of hands    Standing Unsupported with Eyes Closed Able to stand 10 seconds with supervision    Standing Unsupported with Feet Together Able to place feet together independently and stand for 1 minute with supervision    From Standing, Reach Forward with Outstretched Arm Can reach forward >5 cm safely (2")    From Standing Position, Pick up Object from [place] to pick up shoe, needs supervision    From Standing Position, Turn to Look Behind Over each Shoulder Needs supervision when turning    Turn 360 Degrees Needs  assistance while turning    Standing Unsupported, Alternately Place Feet on Step/Stool Able to complete >2 steps/needs minimal assist    Standing Unsupported, One Foot in Front Able to take small step independently and hold 30 seconds    Standing on One Leg Tries to lift leg/unable to hold 3 seconds but remains standing independently    Total Score 34                  TREATMENT     Neuromuscular Re-education  Updated outcome measures and goals with patient today:     OUTCOME MEASURES:   TEST 12/11/20 03/02/21 06/11/21 08/20/21 11/12/21 12/21/21 Interpretation  5 times sit<>stand 10.6s 9.6s 8.1s Not Tested 7.4s 8.2s WNL  10 meter walk test 14.6s = 0.68 m/s 14.3s = 0.70 m/s 13.2s =  0.76 m/s 13.5s=0.74 m/s 14.4s = 0.69 m/s 14.1s = 0.71 m/s <1.0 m/s indicates increased risk for falls; limited community ambulator  Timed up and Go 13.4s 13.5s 11.4s 12.35s 12.2s 12.0s WNL, use of lofstrand crutches, >14 sec indicates increased risk for falls  6 minute walk test Deferred Deferred Deferred Deferred Deferred Deferred 1000 feet is community ambulator  BERG Balance Assessment 32/56 32/56 33/56    34/56 34/56 34/56  <36/56 (100% risk for falls), 37-45 (80% risk for falls); 46-51 (>50% risk for falls); 52-55 (lower risk <25% of falls)  FOTO 51 49 53 53 49 50 Goal is to reach 59     Feet together eyes open/closed static balance x 30s, no UE support; Feet apart eyes open static balance on airex pad x 30s no UE support 6" alternating step-ups with BUE support x 10 on each side;  Staggered stance balance on 6" step eyes open x 30s on each side; Forward/backward stepping in // bars with BUE support x 2 lengths; Side stepping in // bars with BUE support x 2 lengths;     Pt educated throughout session about proper posture and technique with exercises. Improved exercise technique, movement at target joints, use of target muscles after min to mod verbal, visual, tactile cues.       Pt demonstrates  excellent motivation during session today. Updated outcome measures and goals during visit today. Her TUG and BERG showed excellent maintenance of her function and her 5TSTS was 8.2s which is slightly slower than when it was last updated however it is consistent with measurements obtained over the last year.  She showed a slight improvement in her 74mgait speed which is not clinically significant and demonstrates excellent maintenance as well. Her FOTO score increased from 49 to 50. Overall pt shows nice maintenance of her functional outcome measures which is consistent with her goals for therapy. Discharge attempted at the end of 2021 and  she demonstrated a drop in her function during her prolonged absence out of therapy despite good consistency with her HEP. Pt is regularly exercising with the assistance of family as needed/appropriate. She is unable to complete all necessary exercises safely at home either independently or with family assistance due to need from physical therapist to monitor and correct form and technique as well as to provide safe guarding. Pt requires physical therapy intervention to maintain her functional status including LE strength, balance, and gait speed/quality and she has not achieved maximal benefit from therapy services. She will benefit from continued PT services to address these deficits and maintain her function at home and school.                            PT Short Term Goals - 12/22/21 1228       PT SHORT TERM GOAL #1   Title Patient will report compliance with HEP for continued strengthening and stability during functional mobility.     Time 6    Period Weeks    Status Achieved               PT Long Term Goals - 12/22/21 1228       PT LONG TERM GOAL #1   Title Patient will increase her FOTO score to at least 59 to demonstrate increased function and mobility for higher quality of life.    Baseline 12/11/20: 51; 03/02/21: 49; 06/11/21:  53; 08/20/21: 53; 11/12/21: 49; 12/21/21: 50    Time 12    Period Weeks    Status Partially Met    Target Date 02/04/22      PT LONG TERM GOAL #2   Title Patient will maintain Berg Balance score of at least 32/56 in order to demonstrate decreased fall risk during functional activities.    Baseline 12/11/20: 32/56; 03/02/21: 32/56; 06/11/21: 33/56; 08/20/2021: 34/56; 11/12/21: 34/56; 12/22/21: 34/56    Time 12    Period Weeks    Status On-going    Target Date 11/12/21      PT LONG TERM GOAL #3   Title Patient will be able to maintain safe performance with ascending/descending a flight of stairs using lofstrand crutches and railing in order to allow for safe navigation of her home environment.    Baseline 12/11/20: currently able to use lofstrands and/or railing with supervision by family; 03/02/21: Good maintenance observed; 06/11/21: Good maintenance observed; 08/20/2021: Good maintenance observed; 11/13/21: Good maintenance observed, family reports it continues to be the most challenging activity for patient; 12/22/21: Pt demonstrates good maintenance with stairs. She ascends/descends the stairs at home as often as she can without using her chair stairlift    Time 12    Period Weeks    Status On-going    Target Date 02/04/22      PT LONG TERM GOAL #4   Title Patient will complete a TUG test in no more than 13.5 seconds for independent mobility and decreased fall risk maintenance    Baseline 12/11/20: 13.4s; 03/02/21: 13.5s; 06/11/21: 11.4s; 08/20/21: 12.35s; 11/12/21: 12.2s; 12/21/21: 12.0s    Time 12    Period Weeks    Status On-going    Target Date 02/04/22      PT LONG TERM GOAL #5   Title Patient will increase fastest 10 meter walk test to >0.81 m/s as to improve gait speed for better community ambulation and to reduce fall risk.    Baseline  12/11/20: 14.6s = 0.68 m/s; 03/02/21: 14.3s = 0.70 m/s; 06/11/21: 13.2s = 0.76 m/s; 08/20/21: 13.5s= 0.74 m/s; 11/12/21: 14.4s = 0.69 m/s; 12/21/21: 14.1s = 0.71 m/s     Time 12    Period Weeks    Status Partially Met    Target Date 11/12/21                   Plan - 12/22/21 1211     Clinical Impression Statement Pt demonstrates excellent motivation during session today. Updated outcome measures and goals during visit today. Her TUG and BERG showed excellent maintenance of her function and her 5TSTS was 8.2s which is slightly slower than when it was last updated however it is consistent with measurements obtained over the last year.  She showed a slight improvement in her 42mgait speed which is not clinically significant and demonstrates excellent maintenance as well. Her FOTO score increased from 49 to 50. Overall pt shows nice maintenance of her functional outcome measures which is consistent with her goals for therapy. Discharge attempted at the end of 2021 and she demonstrated a drop in her function during her prolonged absence out of therapy despite good consistency with her HEP. Pt is regularly exercising with the assistance of family as needed/appropriate. She is unable to complete all necessary exercises safely at home either independently or with family assistance due to need from physical therapist to monitor and correct form and technique as well as to provide safe guarding. Pt requires physical therapy intervention to maintain her functional status including LE strength, balance, and gait speed/quality and she has not achieved maximal benefit from therapy services. She will benefit from continued PT services to address these deficits and maintain her function at home and school.    Personal Factors and Comorbidities Comorbidity 1;Past/Current Experience;Time since onset of injury/illness/exacerbation;Transportation;Social Background    Comorbidities spina bifida    Examination-Activity Limitations Bend;Carry;Continence;Lift;Reach Overhead;Squat;Stairs;Stand;Transfers    Examination-Participation Restrictions Church;Community  Activity;Driving;Laundry;Meal Prep;Volunteer;Yard Work    Stability/Clinical Decision Making Stable/Uncomplicated    Rehab Potential Good    Clinical Impairments Affecting Rehab Potential weakness and decreased standing balance    PT Frequency Biweekly    PT Duration 12 weeks    PT Treatment/Interventions ADLs/Self Care Home Management;Aquatic Therapy;Biofeedback;Ultrasound;Moist Heat;Iontophoresis 473mml Dexamethasone;Electrical Stimulation;DME Instruction;Gait training;Stair training;Functional mobility training;Neuromuscular re-education;Balance training;Therapeutic exercise;Therapeutic activities;Patient/family education;Orthotic Fit/Training;Wheelchair mobility training;Manual techniques;Dry needling;Passive range of motion;Energy conservation;Splinting;Taping;Vestibular;Visual/perceptual remediation/compensation;Canalith Repostioning;Cryotherapy    PT Next Visit Plan Review HEP, balance and strength exercises    PT Home Exercise Plan Seated, supine, and core program: [id number]    Consulted and Agree with Plan of Care Patient              Patient will benefit from skilled therapeutic intervention in order to improve the following deficits and impairments:  Abnormal gait, Decreased mobility, Decreased balance, Decreased coordination, Difficulty walking, Decreased strength, Impaired tone, Postural dysfunction, Improper body mechanics  Visit Diagnosis: Muscle weakness (generalized)  Unsteadiness on feet     Problem List There are no problems to display for this patient.  JaPhillips GroutT, DPT, GCS  Shaneika Rossa, PT 12/22/2021, 12:36 PM  [place] [facility]Me[place] [place]hone: [phone] Fax:  [phone]: MiAVORY RAHIMIRN: [id number] of Birth: 07/1996-11-18

## 2022-01-07 ENCOUNTER — Ambulatory Visit: Payer: Medicaid Other

## 2022-01-21 ENCOUNTER — Other Ambulatory Visit: Payer: Self-pay

## 2022-01-21 ENCOUNTER — Ambulatory Visit: Payer: Medicaid Other | Attending: Student

## 2022-01-21 DIAGNOSIS — R2681 Unsteadiness on feet: Secondary | ICD-10-CM | POA: Insufficient documentation

## 2022-01-21 DIAGNOSIS — M6281 Muscle weakness (generalized): Secondary | ICD-10-CM

## 2022-01-21 NOTE — Therapy (Signed)
[facility] [place] [address] [place], [place], [address] Phone: [phone]   Fax:  [phone]  Physical Therapy Treatment  Patient Details  Name: KEMI GELL MRN: [id number] Date of Birth: 05-17-1996 No data recorded   Encounter Date: 01/21/2022   PT End of Session - 01/21/22 1622     Visit Number 29    Number of Visits 37    Date for PT Re-Evaluation 02/04/22    Authorization Type eval: 0/99/83, recert: 3/82/50, 5/39/76, 08/20/21, 11/11/21    Authorization Time Period [employer] auth 11/23/21-12/23/21 3 PT visits, Medicaid  VL: 27 combined PT/OT/SLP per year  Auth: is required    Authorization - Visit Number 3    Authorization - Number of Visits 3    PT Start Time 7341    PT Stop Time 1700    PT Time Calculation (min) 45 min    Equipment Utilized During Treatment Gait belt;Other (comment)   Bilateral AFOs, lofstrand crutches   Activity Tolerance Patient tolerated treatment well    Behavior During Therapy WFL for tasks assessed/performed               Past Medical History:  Diagnosis Date   Neurogenic bladder    Neurogenic bowel    S/P VP shunt    Spina bifida [facility])     Past Surgical History:  Procedure Laterality Date   VENTRICULOPERITONEAL SHUNT      There were no vitals filed for this visit.   Subjective Assessment - 01/21/22 1619     Subjective Pt reports that she is doing well. She denies any significant changes since the last therapy session. She will be fitted for new braces later this month. She has eye surgery scheduled for tomorrow. Pt continues performing her HEP consistently and reports no issues. No specific questions or concerns currently.    Pertinent History Patient is a pleasant 26 year old female who presents for continuation of care for generalized weakness/ coordination deficits secondary to diagnosis of spina bifida. PMH includes VP shunt, spina bifida, and neurogenic bowel/bladder. Patient was  discharged from this clinic at the end of 2021 due to meeting therapy cap for insurance that year. Patient reports that she has continued performing her HEP but notes a decline when she stops therapy due to inability to safely perform all of the balance and strength exercises as at home. One of the biggest challenge she is experiencing is stair navigation  She reports having multiple near falls when negotiating stairs at home. Patient must ascend/descend stairs to reach bedroom upstairs increasing her risk for falls daily.    Limitations Walking;Standing;House hold activities;Other (comment)   Stairs   How long can you sit comfortably? n/a    How long can you stand comfortably? require use of bilateral lofstrand crutches    How long can you walk comfortably? dependent upon terrain    Patient Stated Goals Patient wants to be able to negotiate uneven terrain, cross thresholds in home environment, and negotiate steps in household    Currently in Pain? No/denies                        TREATMENT    Ther-ex  NuStep BLE only L2 x 5 minutes for warm-up during interval history (2 minutes unbilled); 6" alternating step-ups on staircase with BUE support x 10 on each side;  Sit to stand from regular height chair with min/modA+1 support from therapist due  to imbalance; Forward and backwards walking in // bars x 4 lengths with CGA from therapist, cues for increased step length and to avoid hip adduction, AFO's donned; Lateral walking in // bars x 4 lengths with CGA from therapist, AFO's donned;   Neuromuscular Re-education  Feet apart eyes open static balance x 30s, no UE support, minA+1 with occasional finger touches by pt; Feet apart eyes open horizontal and vertical head turns x 30s each, no UE support, minA+1 with occasional finger touches by pt; Feet apart eyes closed static balance x 30s, no UE support, minA+1 with occasional finger touches by pt and pt occasionally opening her  eyes; Feet together eyes open static balance x 30s, no UE support, minA+1 with occasional finger touches by pt; Feet together eyes open horizontal and vertical head turns x 30s each, no UE support, minA+1 with occasional finger touches by pt; Feet together eyes closed static balance x 30s, no UE support, minA+1 with occasional finger touches by pt and pt occasionally opening her eyes; Feet staggered eyes open static balance alternating forward LE x 30s each, no UE support, minA+1 with occasional finger touches by pt; Feet staggered eyes open horizontal and vertical head turns alternating forward LE x 30s each, no UE support, minA+1 with occasional finger touches by pt; Feet staggered eyes closed static balance alternating forward LE x 30s, no UE support, minA+1 with occasional finger touches by pt and pt occasionally opening her eyes; Airex feet together eyes open static balance x 30s, no UE support, minA+1 with occasional finger touches by pt; Airex feet together eyes open horizontal and vertical head turns x 30s each, no UE support, minA+1 with occasional finger touches by pt;    Pt educated throughout session about proper posture and technique with exercises. Improved exercise technique, movement at target joints, use of target muscles after min to mod verbal, visual, tactile cues.     Pt demonstrates excellent motivation during today's session. Continued with strengthening and balance exercises with greater focus today on balance exercises. Discussed plan to work toward decreasing frequency of her therapy sessions to once/month to assess if she is able to maintain her strength, balance, and functional status with less frequent therapy. Reviewed HEP with patient today and she reports consistent performance however her family is unable to provide the same correction to form and technique as well as progressions/regressions to her HEP as therapist. Pt will benefit from continued therapy as she is not  able to complete all necessary exercises safely at home even with family assistance. Pt requires PT sessions to continue to maintain her functional status of living by continue to work on her LE strength, balance and gait.  Discharge attempted at the end of 2021 and she demonstrated a drop in her function during her prolonged absence out of therapy despite good consistency with her HEP. Pt is regularly exercising with the assistance of family as needed/appropriate. She is unable to complete all necessary exercises safely at home either independently or with family assistance due to need from physical therapist to monitor and correct form and technique as well as to provide safe guarding. Pt requires physical therapy intervention to maintain her functional status including LE strength, balance, and gait speed/quality and she has not achieved maximal benefit from therapy services. She will benefit from continued PT services to address these deficits and maintain her function at home and school.  PT Short Term Goals - 12/22/21 1228       PT SHORT TERM GOAL #1   Title Patient will report compliance with HEP for continued strengthening and stability during functional mobility.     Time 6    Period Weeks    Status Achieved               PT Long Term Goals - 12/22/21 1228       PT LONG TERM GOAL #1   Title Patient will increase her FOTO score to at least 59 to demonstrate increased function and mobility for higher quality of life.    Baseline 12/11/20: 51; 03/02/21: 49; 06/11/21: 53; 08/20/21: 53; 11/12/21: 49; 12/21/21: 50    Time 12    Period Weeks    Status Partially Met    Target Date 02/04/22      PT LONG TERM GOAL #2   Title Patient will maintain Berg Balance score of at least 32/56 in order to demonstrate decreased fall risk during functional activities.    Baseline 12/11/20: 32/56; 03/02/21: 32/56; 06/11/21: 33/56; 08/20/2021: 34/56;  11/12/21: 34/56; 12/22/21: 34/56    Time 12    Period Weeks    Status On-going    Target Date 11/12/21      PT LONG TERM GOAL #3   Title Patient will be able to maintain safe performance with ascending/descending a flight of stairs using lofstrand crutches and railing in order to allow for safe navigation of her home environment.    Baseline 12/11/20: currently able to use lofstrands and/or railing with supervision by family; 03/02/21: Good maintenance observed; 06/11/21: Good maintenance observed; 08/20/2021: Good maintenance observed; 11/13/21: Good maintenance observed, family reports it continues to be the most challenging activity for patient; 12/22/21: Pt demonstrates good maintenance with stairs. She ascends/descends the stairs at home as often as she can without using her chair stairlift    Time 12    Period Weeks    Status On-going    Target Date 02/04/22      PT LONG TERM GOAL #4   Title Patient will complete a TUG test in no more than 13.5 seconds for independent mobility and decreased fall risk maintenance    Baseline 12/11/20: 13.4s; 03/02/21: 13.5s; 06/11/21: 11.4s; 08/20/21: 12.35s; 11/12/21: 12.2s; 12/21/21: 12.0s    Time 12    Period Weeks    Status On-going    Target Date 02/04/22      PT LONG TERM GOAL #5   Title Patient will increase fastest 10 meter walk test to >0.81 m/s as to improve gait speed for better community ambulation and to reduce fall risk.    Baseline 12/11/20: 14.6s = 0.68 m/s; 03/02/21: 14.3s = 0.70 m/s; 06/11/21: 13.2s = 0.76 m/s; 08/20/21: 13.5s= 0.74 m/s; 11/12/21: 14.4s = 0.69 m/s; 12/21/21: 14.1s = 0.71 m/s    Time 12    Period Weeks    Status Partially Met    Target Date 11/12/21                   Plan - 01/21/22 1622     Clinical Impression Statement Pt demonstrates excellent motivation during today's session. Continued with strengthening and balance exercises with greater focus today on balance exercises. Discussed plan to work toward decreasing  frequency of her therapy sessions to once/month to assess if she is able to maintain her strength, balance, and functional status with less frequent therapy. Reviewed HEP with patient today and she reports consistent  performance however her family is unable to provide the same correction to form and technique as well as progressions/regressions to her HEP as therapist. Pt will benefit from continued therapy as she is not able to complete all necessary exercises safely at home even with family assistance. Pt requires PT sessions to continue to maintain her functional status of living by continue to work on her LE strength, balance and gait.  Discharge attempted at the end of 2021 and she demonstrated a drop in her function during her prolonged absence out of therapy despite good consistency with her HEP. Pt is regularly exercising with the assistance of family as needed/appropriate. She is unable to complete all necessary exercises safely at home either independently or with family assistance due to need from physical therapist to monitor and correct form and technique as well as to provide safe guarding. Pt requires physical therapy intervention to maintain her functional status including LE strength, balance, and gait speed/quality and she has not achieved maximal benefit from therapy services. She will benefit from continued PT services to address these deficits and maintain her function at home and school.    Personal Factors and Comorbidities Comorbidity 1;Past/Current Experience;Time since onset of injury/illness/exacerbation;Transportation;Social Background    Comorbidities spina bifida    Examination-Activity Limitations Bend;Carry;Continence;Lift;Reach Overhead;Squat;Stairs;Stand;Transfers    Examination-Participation Restrictions Church;Community Activity;Driving;Laundry;Meal Prep;Volunteer;Yard Work    Stability/Clinical Decision Making Stable/Uncomplicated    Rehab Potential Good    Clinical  Impairments Affecting Rehab Potential weakness and decreased standing balance    PT Frequency Biweekly    PT Duration 12 weeks    PT Treatment/Interventions ADLs/Self Care Home Management;Aquatic Therapy;Biofeedback;Ultrasound;Moist Heat;Iontophoresis 9m/ml Dexamethasone;Electrical Stimulation;DME Instruction;Gait training;Stair training;Functional mobility training;Neuromuscular re-education;Balance training;Therapeutic exercise;Therapeutic activities;Patient/family education;Orthotic Fit/Training;Wheelchair mobility training;Manual techniques;Dry needling;Passive range of motion;Energy conservation;Splinting;Taping;Vestibular;Visual/perceptual remediation/compensation;Canalith Repostioning;Cryotherapy    PT Next Visit Plan Progress note, review and modify HEP as appropriate, balance and strength exercises    PT Home Exercise Plan Seated, supine, and core program: [id number]    Consulted and Agree with Plan of Care Patient               Patient will benefit from skilled therapeutic intervention in order to improve the following deficits and impairments:  Abnormal gait, Decreased mobility, Decreased balance, Decreased coordination, Difficulty walking, Decreased strength, Impaired tone, Postural dysfunction, Improper body mechanics  Visit Diagnosis: Muscle weakness (generalized)  Unsteadiness on feet     Problem List There are no problems to display for this patient.  JPhillips GroutPT, DPT, GCS  Zadia Uhde, PT 01/22/2022, 5:51 PM  [place] [place] [facility] [place] [address]Phone: [phone]  Fax:  [phone] Name: MLAURIELLE SELMONMRN: [id number] of Birth: [birth year]

## 2022-02-03 NOTE — Therapy (Signed)
[place] ?[facility] ?[address] [place], [place], [address] ?Phone: [phone]   Fax:  [phone] ? ?Physical Therapy Progress Note/Treatment/Recertification ? ?Dates of reporting period  09/03/22   to   02/04/22 ? ?Patient Details  ?Name: Michelle Randall ?MRN: [id number] ?Date of Birth: [birth year] ?No data recorded ? ? ?Encounter Date: 02/04/2022 ? ? PT End of Session - 02/05/22 1212   ? ? Visit Number 30   ? Number of Visits 43   ? Date for PT Re-Evaluation 04/29/22   ? Authorization Type eval: 0/96/28, recert: 3/66/29, 4/76/54, 08/20/21, 11/11/21, 02/04/22   ? Authorization Time Period [employer] auth 11/23/21-12/23/21 3 PT visits, 01/21/22-04/14/22 6 PT visits, Medicaid  VL: 27 combined PT/OT/SLP per year  Auth: is required   ? Authorization - Visit Number 2   ? Authorization - Number of Visits 6   ? PT Start Time 1615   ? PT Stop Time 1700   ? PT Time Calculation (min) 45 min   ? Equipment Utilized During Treatment Gait belt;Other (comment)   Bilateral AFOs, lofstrand crutches  ? Activity Tolerance Patient tolerated treatment well   ? Behavior During Therapy [facility] for tasks assessed/performed   ? ?  ?  ? ?  ? ? ? ? ? ?Past Medical History:  ?Diagnosis Date  ? Neurogenic bladder   ? Neurogenic bowel   ? S/P VP shunt   ? Spina bifida ([place])   ? ? ?Past Surgical History:  ?Procedure Laterality Date  ? VENTRICULOPERITONEAL SHUNT    ? ? ?There were no vitals filed for this visit. ? ? Subjective Assessment - 02/04/22 1622   ? ? Subjective Pt reports that she is doing well today. She had eye surgery on 01/22/22 which went well and she was able to rest over her spring break. Today is the first full day of Ramadan so she is fasting today. She denies any significant changes since the last therapy session. She will be fitted for new braces on 02/11/22. Pt continues performing her HEP consistently and reports no issues. No specific questions or concerns currently.   ? Pertinent History Patient is a  pleasant 26 year old female who presents for continuation of care for generalized weakness/ coordination deficits secondary to diagnosis of spina bifida. PMH includes VP shunt, spina bifida, and neurogenic bowel/bladder. Patient was discharged from this clinic at the end of 2021 due to meeting therapy cap for insurance that year. Patient reports that she has continued performing her HEP but notes a decline when she stops therapy due to inability to safely perform all of the balance and strength exercises as at home. One of the biggest challenge she is experiencing is stair navigation  She reports having multiple near falls when negotiating stairs at home. Patient must ascend/descend stairs to reach bedroom upstairs increasing her risk for falls daily.   ? Limitations Walking;Standing;House hold activities;Other (comment)   Stairs  ? How long can you sit comfortably? n/a   ? How long can you stand comfortably? require use of bilateral lofstrand crutches   ? How long can you walk comfortably? dependent upon terrain   ? Patient Stated Goals Patient wants to be able to negotiate uneven terrain, cross thresholds in home environment, and negotiate steps in household   ? Currently in Pain? No/denies   ? ?  ?  ? ?  ? ? ? ? ? ?  ?TREATMENT ?  ? ?Ther-ex  ?NuStep BLE only  L2 x 5 minutes for warm-up during interval history (2 minutes unbilled); ? ?Mat table exercises: ?Supine SLR 2 x 10 BLE with verbal and tactile cues from therapist to maintain neutral hip rotation and to prevent adductor spasticity from forcing LE's into adduction; ?Hooklying clams with light manual resistance from therapist 2 x 10; ?Hooklying adductor squeezes with light manual resistance from therapist 2 x 10; ?Hooklying marches alternating LEs 2 x 10; ?Supine SLR hip abduction/adduction with light manual resistance from therapist as appropriate as well as verbal and tactile cues to maintain neutral hip rotation preventing attempts to substitute with hip  flexors 2 x 10 each;  ?Hooklying bridges 2 x 10, verbal cues from therapist to increase height of bridge and duration of hold; ?Supine 90/90 bicycles with verbal and tactile cues as well as assist to prevent bilateral hip adduction x 10 BLE; ?Seated hip flexion marching against gravity x 10 BLE; ?Seated LAQ and hamstring curls with manual resistance from therapist 2 x 10 each with therapist modifying resistance for appropriate challenge but to allow for reasonable success; ? ? ?Pt educated throughout session about proper posture and technique with exercises. Improved exercise technique, movement at target joints, use of target muscles after min to mod verbal, visual, tactile cues.  ? ? ?Pt demonstrates excellent motivation during session today. Session today focused on review of home exercise program by performing strengthening on the mat table. Progress note written for visit today. However, her outcome measures were not updated today because pt has only had one therapy appointment since goals were last updated and she has been fasting all day since it is the first day of Ramadan. Outcome measures were last updated during visit on 12/22/21. At that time her TUG and BERG showed excellent maintenance of her function and her 5TSTS was 8.2s which was slightly slower than prior test however it was consistent with measurements that were obtained over the prior year.  Pt showed a slight improvement in her 16mgait speed (not clinically significant)  demonstrating excellent maintenance of her gait as well. Her FOTO score increased from 49 to 50. Overall pt shows nice maintenance of her functional outcome measures which is consistent with her goals for therapy. Discharge attempted at the end of 2021 and she demonstrated a drop in her function during her prolonged absence out of therapy despite good consistency with her HEP. Pt is regularly exercising with the assistance of family as needed/appropriate. She is unable to  complete all necessary exercises safely at home either independently or with family assistance due to need from physical therapist to monitor and correct form and technique as well as to provide safe guarding. Pt requires physical therapy intervention to maintain her functional status including LE strength, balance, and gait speed/quality and she has not achieved maximal benefit from therapy services. She will benefit from continued PT services to address these deficits and maintain her function at home and school. ?  ?  ? ? ?  ?  ?  ?  ? ? ? ? ? ? ? ? ? ? ? ? ? ? ? ? ? ? ? ? ? ? ? ? ? PT Short Term Goals - 02/05/22 1216   ? ?  ? PT SHORT TERM GOAL #1  ? Title Patient will report compliance with HEP for continued strengthening and stability during functional mobility.    ? Time 6   ? Period Weeks   ? Status Achieved   ? ?  ?  ? ?  ? ? ? ?  PT Long Term Goals - 02/05/22 1216   ? ?  ? PT LONG TERM GOAL #1  ? Title Patient will increase her FOTO score to at least 59 to demonstrate increased function and mobility for higher quality of life.   ? Baseline 12/11/20: 51; 03/02/21: 49; 06/11/21: 53; 08/20/21: 53; 11/12/21: 49; 12/21/21: 50   ? Time 12   ? Period Weeks   ? Status Partially Met   ? Target Date 04/29/22   ?  ? PT LONG TERM GOAL #2  ? Title Patient will maintain Berg Balance score of at least 32/56 in order to demonstrate decreased fall risk during functional activities.   ? Baseline 12/11/20: 32/56; 03/02/21: 32/56; 06/11/21: 33/56; 08/20/2021: 84/03; 11/12/21: 35/33; 12/22/21: 34/56   ? Time 12   ? Period Weeks   ? Status On-going   ? Target Date 04/29/22   ?  ? PT LONG TERM GOAL #3  ? Title Patient will be able to maintain safe performance with ascending/descending a flight of stairs using lofstrand crutches and railing in order to allow for safe navigation of her home environment.   ? Baseline 12/11/20: currently able to use lofstrands and/or railing with supervision by family; 03/02/21: Good maintenance observed; 06/11/21:  Good maintenance observed; 08/20/2021: Good maintenance observed; 11/13/21: Good maintenance observed, family reports it continues to be the most challenging activity for patient; 12/22/21: Pt demonstrates goo

## 2022-02-04 ENCOUNTER — Other Ambulatory Visit: Payer: Self-pay

## 2022-02-04 ENCOUNTER — Ambulatory Visit: Payer: Medicaid Other

## 2022-02-04 DIAGNOSIS — R2681 Unsteadiness on feet: Secondary | ICD-10-CM

## 2022-02-04 DIAGNOSIS — M6281 Muscle weakness (generalized): Secondary | ICD-10-CM | POA: Diagnosis not present

## 2022-02-17 NOTE — Therapy (Signed)
?OUTPATIENT PHYSICAL THERAPY TREATMENT NOTE ? ? ?Patient Name: Michelle Randall ?MRN: [id number] ?DOB:06/22/1996, 26 y.o., female ?Today's Date: 02/19/2022 ? ?PCP: Wayland Denis, PA-C ?REFERRING PROVIDER: Wayland Denis, PA-C ? ? PT End of Session - 02/18/22 1623   ? ? Visit Number 31   ? Number of Visits 43   ? Date for PT Re-Evaluation 04/29/22   ? Authorization Type eval: 2/45/80, recert: 9/98/33, 07/09/04, 08/20/21, 11/11/21, 02/04/22   ? Authorization Time Period [facility] auth 11/23/21-12/23/21 3 PT visits, 01/21/22-04/14/22 6 PT visits, Medicaid  VL: 27 combined PT/OT/SLP per year  Auth: is required   ? Authorization - Visit Number 3   ? Authorization - Number of Visits 6   ? PT Start Time 1615   ? PT Stop Time 1700   ? PT Time Calculation (min) 45 min   ? Equipment Utilized During Treatment Gait belt;Other (comment)   Bilateral AFOs, lofstrand crutches  ? Activity Tolerance Patient tolerated treatment well   ? Behavior During Therapy [facility] for tasks assessed/performed   ? ?  ?  ? ?  ? ? ?Past Medical History:  ?Diagnosis Date  ? Neurogenic bladder   ? Neurogenic bowel   ? S/P VP shunt   ? Spina bifida ([place])   ? ?Past Surgical History:  ?Procedure Laterality Date  ? VENTRICULOPERITONEAL SHUNT    ? ?There are no problems to display for this patient. ? ? ?REFERRING DIAG: R26.81 (ICD-10-CM) - Unsteadiness on feet ? ?THERAPY DIAG: Muscle weakness (generalized) ? ?Unsteadiness on feet ? ?PERTINENT HISTORY: Patient is a pleasant 26 year old female who presents for continuation of care for generalized weakness/ coordination deficits secondary to diagnosis of spina bifida. PMH includes VP shunt, spina bifida, and neurogenic bowel/bladder. Patient was discharged from this clinic at the end of 2021 due to meeting therapy cap for insurance that year. Patient reports that she has continued performing her HEP but notes a decline when she stops therapy due to inability to safely perform all of the balance and strength exercises as at  home. One of the biggest challenge she is experiencing is stair navigation She reports having multiple near falls when negotiating stairs at home. Patient must ascend/descend stairs to reach bedroom upstairs increasing her risk for falls daily.  ? ?PRECAUTIONS: Fall risk ? ?SUBJECTIVE: Pt reports that she is doing well today. She is still fasting today because of Ramadan. She denies any significant changes since the last therapy session. She was fitted for new AFO braces but they will not arrive for another 6 weeks. She continues performing her HEP consistently and reports no issues. No specific questions or concerns currently.  ? ?PAIN:  ?Are you having pain? No ? ? ?TODAY'S TREATMENT:   ?  ?Ther-ex  ?NuStep BLE only L2 x 5 minutes for warm-up during interval history (2 minutes unbilled); ?Seated hip flexion marching against gravity x 15 BLE; ?Seated LAQ and hamstring curls with manual resistance from therapist x 15 each with therapist modifying resistance for appropriate challenge but to allow for reasonable success; ?Seated clams x 15; ?Seated adductor squeeze with manual resistance from therapist x 15; ?Standing mini squats in // bars with BUE support x 10; ?Split squats in // bars with BUE support and CGA/minA+1 x 10 with each foot forward; ?Side stepping in // bars with BUE support x 4 lengths; ?6" alternating step-ups in // bars with BUE x 5 leading with each leg; ?  ?  ?Neuromuscular Re-education  ?Feet apart eyes  open static balance x 30s, no UE support, minA+1 with occasional finger touches by pt; ?Feet apart eyes open horizontal and vertical head turns x 30s each, no UE support; ?Feet apart eyes closed static balance x 30s, no UE support, minA+1 with occasional finger touches by pt and pt occasionally opening her eyes; ?Feet together eyes open static balance x 30s, no UE support, minA+1 with occasional finger touches by pt; ?Feet together eyes open horizontal and vertical head turns x 30s each, no UE  support, minA+1 with occasional finger touches by pt; ?Feet together eyes closed static balance x 30s, no UE support, minA+1 with occasional finger touches by pt and pt occasionally opening her eyes; ? ?  ?Pt educated throughout session about proper posture and technique with exercises. Improved exercise technique, movement at target joints, use of target muscles after min to mod verbal, visual, tactile cues.   ? ? ? ?PATIENT EDUCATION: ?Education details: Pt educated throughout session about proper posture and technique with exercises. Improved exercise technique, movement at target joints, use of target muscles after min to mod verbal, visual, tactile cues.  ?Person educated: Patient ?Education method: Explanation and Demonstration ?Education comprehension: verbalized understanding and returned demonstration ? ? ?HOME EXERCISE PROGRAM: ?Seated, supine, and core program: Medbridge Access Code: [email or web address]  ? ? ? ? PT Short Term Goals   ? ?  ? PT SHORT TERM GOAL #1  ? Title Patient will report compliance with HEP for continued strengthening and stability during functional mobility.    ? Time 6   ? Period Weeks   ? Status Achieved   ? ?  ?  ? ?  ? ? ? PT Long Term Goals  ? ?  ? PT LONG TERM GOAL #1  ? Title Patient will increase her FOTO score to at least 59 to demonstrate increased function and mobility for higher quality of life.   ? Baseline 12/11/20: 51; 03/02/21: 49; 06/11/21: 53; 08/20/21: 53; 11/12/21: 49; 12/21/21: 50   ? Time 12   ? Period Weeks   ? Status Partially Met   ? Target Date 04/29/22   ?  ? PT LONG TERM GOAL #2  ? Title Patient will maintain Berg Balance score of at least 32/56 in order to demonstrate decreased fall risk during functional activities.   ? Baseline 12/11/20: 32/56; 03/02/21: 32/56; 06/11/21: 33/56; 08/20/2021: 08/67; 11/12/21: 61/95; 12/22/21: 34/56   ? Time 12   ? Period Weeks   ? Status On-going   ? Target Date 04/29/22   ?  ? PT LONG TERM GOAL #3  ? Title Patient will be able to maintain safe  performance with ascending/descending a flight of stairs using lofstrand crutches and railing in order to allow for safe navigation of her home environment.   ? Baseline 12/11/20: currently able to use lofstrands and/or railing with supervision by family; 03/02/21: Good maintenance observed; 06/11/21: Good maintenance observed; 08/20/2021: Good maintenance observed; 11/13/21: Good maintenance observed, family reports it continues to be the most challenging activity for patient; 12/22/21: Pt demonstrates good maintenance with stairs. She ascends/descends the stairs at home as often as she can without using her chair stairlift   ? Time 12   ? Period Weeks   ? Status On-going   ? Target Date 04/29/22   ?  ? PT LONG TERM GOAL #4  ? Title Patient will complete a TUG test in no more than 13.5 seconds for independent mobility and decreased fall risk maintenance   ?  Baseline 12/11/20: 13.4s; 03/02/21: 13.5s; 06/11/21: 11.4s; 08/20/21: 12.35s; 11/12/21: 12.2s; 12/21/21: 12.0s   ? Time 12   ? Period Weeks   ? Status On-going   ? Target Date 04/29/22   ?  ? PT LONG TERM GOAL #5  ? Title Patient will increase fastest 10 meter walk test to >0.81 m/s as to improve gait speed for better community ambulation and to reduce fall risk.   ? Baseline 12/11/20: 14.6s = 0.68 m/s; 03/02/21: 14.3s = 0.70 m/s; 06/11/21: 13.2s = 0.76 m/s; 08/20/21: 13.5s= 0.74 m/s; 11/12/21: 14.4s = 0.69 m/s; 12/21/21: 14.1s = 0.71 m/s   ? Time 12   ? Period Weeks   ? Status Partially Met   ? Target Date 04/29/22   ? ?  ?  ? ?  ? ? ? Plan   ? ? Clinical Impression Statement Pt demonstrates excellent motivation during today's session. She is still fasting because her family continues to celebrate Ramadan until its conclusion. 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 Continued with strengthening and balance exercises. She continues to require frequent cues for adjusting exercise technique and form to encourage proper muscle  activation. During balance training pt continues to demonstrate instability especially in situations where eyes are closed and/or base of support is narrow requiring therapist guarding in parallel bars. Pt wi

## 2022-02-18 ENCOUNTER — Ambulatory Visit: Payer: Medicaid Other | Attending: Student

## 2022-02-18 DIAGNOSIS — M6281 Muscle weakness (generalized): Secondary | ICD-10-CM | POA: Diagnosis present

## 2022-02-18 DIAGNOSIS — R2681 Unsteadiness on feet: Secondary | ICD-10-CM | POA: Diagnosis present

## 2022-03-03 NOTE — Therapy (Signed)
?OUTPATIENT PHYSICAL THERAPY TREATMENT NOTE ? ? ?Patient Name: Michelle Randall ?MRN: [id number] ?DOB:1996-06-25, 26 y.o., female ?Today's Date: 03/04/2022 ? ?PCP: Wayland Denis, PA-C ?REFERRING PROVIDER: Wayland Denis, PA-C ? ? PT End of Session - 03/04/22 7616   ? ? Visit Number 32   ? Number of Visits 43   ? Date for PT Re-Evaluation 04/29/22   ? Authorization Type eval: 0/73/71, recert: 0/62/69, 4/85/46, 08/20/21, 11/11/21, 02/04/22   ? Authorization Time Period [facility] auth 11/23/21-12/23/21 3 PT visits, 01/21/22-04/14/22 6 PT visits, Medicaid  VL: 27 combined PT/OT/SLP per year  Auth: is required   ? Authorization - Visit Number 4   ? Authorization - Number of Visits 6   ? PT Start Time 0848   ? PT Stop Time 0930   ? PT Time Calculation (min) 42 min   ? Equipment Utilized During Treatment Gait belt;Other (comment)   Bilateral AFOs, lofstrand crutches  ? Activity Tolerance Patient tolerated treatment well   ? Behavior During Therapy [facility] for tasks assessed/performed   ? ?  ?  ? ?  ? ? ? ?Past Medical History:  ?Diagnosis Date  ? Neurogenic bladder   ? Neurogenic bowel   ? S/P VP shunt   ? Spina bifida ([place])   ? ?Past Surgical History:  ?Procedure Laterality Date  ? VENTRICULOPERITONEAL SHUNT    ? ?There are no problems to display for this patient. ? ? ?REFERRING DIAG: R26.81 (ICD-10-CM) - Unsteadiness on feet ? ?THERAPY DIAG: Muscle weakness (generalized) ? ?Unsteadiness on feet ? ?PERTINENT HISTORY: Patient is a pleasant 26 year old female who presents for continuation of care for generalized weakness/ coordination deficits secondary to diagnosis of spina bifida. PMH includes VP shunt, spina bifida, and neurogenic bowel/bladder. Patient was discharged from this clinic at the end of 2021 due to meeting therapy cap for insurance that year. Patient reports that she has continued performing her HEP but notes a decline when she stops therapy due to inability to safely perform all of the balance and strength exercises as at  home. One of the biggest challenge she is experiencing is stair navigation She reports having multiple near falls when negotiating stairs at home. Patient must ascend/descend stairs to reach bedroom upstairs increasing her risk for falls daily.  ? ?PRECAUTIONS: Fall risk ? ?SUBJECTIVE: Pt reports that she is doing well today. She is still fasting today because of Ramadan but today is the last day. She denies any significant changes since the last therapy session. She continues performing her HEP consistently and reports no issues. No equipment issues at home. No specific questions or concerns currently.  ? ?PAIN:  ?Are you having pain? No ? ? ?TODAY'S TREATMENT:   ?  ? ?Ther-ex  ?NuStep BLE only L2-3 x 6 minutes for warm-up during interval history (2 minutes unbilled); ?6" alternating forward step-ups with BUE on handrails x 10 leading with each leg; ?6" alternating lateral step-ups with BUE on handrails x 10 toward each side; ?Seated LAQ and hamstring curls with manual resistance from therapist x 10 each with therapist modifying resistance for appropriate challenge but to allow for reasonable success; ?Standing mini squats in // bars with BUE support x 10; ?Split squats in // bars with BUE support and CGA/minA+1 x 10 with each foot forward; ?Side stepping in // bars with BUE support x 4 lengths; ?  ?  ?Neuromuscular Re-education  ?Feet apart eyes open static balance x 30s, no UE support, minA+1 with occasional finger touches  by pt; ?Feet apart eyes open horizontal and vertical head turns x 30s each, no UE support; ?Feet apart eyes closed static balance x 30s, no UE support, minA+1 with occasional finger touches by pt and pt occasionally opening her eyes; ?Staggered feet eyes open static balance x 30s with each LE forward no UE support, minA+1 with occasional finger touches by pt; ?  ? ? ?PATIENT EDUCATION: ?Education details: Pt educated throughout session about proper posture and technique with exercises. Improved  exercise technique, movement at target joints, use of target muscles after min to mod verbal, visual, tactile cues.  ?Person educated: Patient ?Education method: Explanation and Demonstration ?Education comprehension: verbalized understanding and returned demonstration ? ? ?HOME EXERCISE PROGRAM: ?Seated, supine, and core program: Medbridge Access Code: [email or web address]  ? ? ? ? PT Short Term Goals   ? ?  ? PT SHORT TERM GOAL #1  ? Title Patient will report compliance with HEP for continued strengthening and stability during functional mobility.    ? Time 6   ? Period Weeks   ? Status Achieved   ? ?  ?  ? ?  ? ? ? PT Long Term Goals  ? ?  ? PT LONG TERM GOAL #1  ? Title Patient will increase her FOTO score to at least 59 to demonstrate increased function and mobility for higher quality of life.   ? Baseline 12/11/20: 51; 03/02/21: 49; 06/11/21: 53; 08/20/21: 53; 11/12/21: 49; 12/21/21: 50   ? Time 12   ? Period Weeks   ? Status Partially Met   ? Target Date 04/29/22   ?  ? PT LONG TERM GOAL #2  ? Title Patient will maintain Berg Balance score of at least 32/56 in order to demonstrate decreased fall risk during functional activities.   ? Baseline 12/11/20: 32/56; 03/02/21: 32/56; 06/11/21: 33/56; 08/20/2021: 06/30; 11/12/21: 16/01; 12/22/21: 34/56   ? Time 12   ? Period Weeks   ? Status On-going   ? Target Date 04/29/22   ?  ? PT LONG TERM GOAL #3  ? Title Patient will be able to maintain safe performance with ascending/descending a flight of stairs using lofstrand crutches and railing in order to allow for safe navigation of her home environment.   ? Baseline 12/11/20: currently able to use lofstrands and/or railing with supervision by family; 03/02/21: Good maintenance observed; 06/11/21: Good maintenance observed; 08/20/2021: Good maintenance observed; 11/13/21: Good maintenance observed, family reports it continues to be the most challenging activity for patient; 12/22/21: Pt demonstrates good maintenance with stairs. She ascends/descends  the stairs at home as often as she can without using her chair stairlift   ? Time 12   ? Period Weeks   ? Status On-going   ? Target Date 04/29/22   ?  ? PT LONG TERM GOAL #4  ? Title Patient will complete a TUG test in no more than 13.5 seconds for independent mobility and decreased fall risk maintenance   ? Baseline 12/11/20: 13.4s; 03/02/21: 13.5s; 06/11/21: 11.4s; 08/20/21: 12.35s; 11/12/21: 12.2s; 12/21/21: 12.0s   ? Time 12   ? Period Weeks   ? Status On-going   ? Target Date 04/29/22   ?  ? PT LONG TERM GOAL #5  ? Title Patient will increase fastest 10 meter walk test to >0.81 m/s as to improve gait speed for better community ambulation and to reduce fall risk.   ? Baseline 12/11/20: 14.6s = 0.68 m/s; 03/02/21: 14.3s = 0.70 m/s; 06/11/21: 13.2s =  0.76 m/s; 08/20/21: 13.5s= 0.74 m/s; 11/12/21: 14.4s = 0.69 m/s; 12/21/21: 14.1s = 0.71 m/s   ? Time 12   ? Period Weeks   ? Status Partially Met   ? Target Date 04/29/22   ? ?  ?  ? ?  ? ? ? Plan   ? ? Clinical Impression Statement Pt demonstrates excellent motivation during today's session. She is still fasting because her family continues to celebrate Ramadan and today is the last day. 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 Continued with strengthening and balance exercises. She continues to require frequent cues for adjusting exercise technique and form to encourage proper muscle activation. During balance training pt continues to demonstrate instability especially in situations where eyes are closed and with horizontal/vertical head turns requiring therapist guarding in parallel bars. Pt will benefit from continued therapy as she is not able to complete all necessary exercises safely at home even with family assistance. Pt requires PT sessions to continue to maintain her functional status of living by continue to work on her LE strength, balance and gait.  Discharge attempted at the end of 2021 and she demonstrated a  drop in her function during her prolonged absence out of therapy despite good consistency with her HEP. Pt is regularly exercising with the assistance of family as needed/appropriate. She is unable

## 2022-03-04 ENCOUNTER — Ambulatory Visit: Payer: Medicaid Other

## 2022-03-04 DIAGNOSIS — M6281 Muscle weakness (generalized): Secondary | ICD-10-CM | POA: Diagnosis not present

## 2022-03-04 DIAGNOSIS — R2681 Unsteadiness on feet: Secondary | ICD-10-CM

## 2022-03-18 ENCOUNTER — Ambulatory Visit: Payer: Medicaid Other | Attending: Student

## 2022-03-18 DIAGNOSIS — M6281 Muscle weakness (generalized): Secondary | ICD-10-CM | POA: Diagnosis present

## 2022-03-18 DIAGNOSIS — R2681 Unsteadiness on feet: Secondary | ICD-10-CM | POA: Insufficient documentation

## 2022-03-18 NOTE — Therapy (Signed)
?OUTPATIENT PHYSICAL THERAPY TREATMENT NOTE ? ? ?Patient Name: Michelle Randall ?MRN: [id number] ?DOB:Oct 05, 1996, 26 y.o., female ?Today's Date: 03/19/2022 ? ?PCP: Wayland Denis, PA-C ?REFERRING PROVIDER: Wayland Denis, PA-C ? ? PT End of Session - 03/18/22 1625   ? ? Visit Number 33   ? Number of Visits 43   ? Date for PT Re-Evaluation 04/29/22   ? Authorization Type eval: 1/49/70, recert: 2/63/78, 5/88/50, 08/20/21, 11/11/21, 02/04/22   ? Authorization Time Period [facility] auth 11/23/21-12/23/21 3 PT visits, 01/21/22-04/14/22 6 PT visits, Medicaid  VL: 27 combined PT/OT/SLP per year  Auth: is required   ? Authorization - Visit Number 5   ? Authorization - Number of Visits 6   ? PT Start Time 1615   ? PT Stop Time 1700   ? PT Time Calculation (min) 45 min   ? Equipment Utilized During Treatment Gait belt;Other (comment)   Bilateral AFOs, lofstrand crutches  ? Activity Tolerance Patient tolerated treatment well   ? Behavior During Therapy [facility] for tasks assessed/performed   ? ?  ?  ? ?  ? ? ? ? ?Past Medical History:  ?Diagnosis Date  ? Neurogenic bladder   ? Neurogenic bowel   ? S/P VP shunt   ? Spina bifida ([place])   ? ?Past Surgical History:  ?Procedure Laterality Date  ? VENTRICULOPERITONEAL SHUNT    ? ?There are no problems to display for this patient. ? ? ?REFERRING DIAG: R26.81 (ICD-10-CM) - Unsteadiness on feet ? ?THERAPY DIAG: Muscle weakness (generalized) ? ?Unsteadiness on feet ? ?PERTINENT HISTORY: Patient is a pleasant 26 year old female who presents for continuation of care for generalized weakness/ coordination deficits secondary to diagnosis of spina bifida. PMH includes VP shunt, spina bifida, and neurogenic bowel/bladder. Patient was discharged from this clinic at the end of 2021 due to meeting therapy cap for insurance that year. Patient reports that she has continued performing her HEP but notes a decline when she stops therapy due to inability to safely perform all of the balance and strength exercises as  at home. One of the biggest challenge she is experiencing is stair navigation She reports having multiple near falls when negotiating stairs at home. Patient must ascend/descend stairs to reach bedroom upstairs increasing her risk for falls daily.  ? ?PRECAUTIONS: Fall risk ? ?SUBJECTIVE: Pt reports that she is doing well today. She denies any significant changes since the last therapy session. 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 She continues performing her HEP consistently and reports no issues. Family is supportive and helpful with HEP. No equipment issues at home. No pain reported upon arrival. No specific questions or concerns currently.  ? ?PAIN:  ?Are you having pain? No ? ? ?TODAY'S TREATMENT:   ?  ? ?Ther-ex  ?NuStep BLE only L2-3 x 6 minutes for warm-up during interval history (2 minutes unbilled); ?Squats in // bars with BUE support x 10; ?Seated LAQ and hamstring curls with manual resistance from therapist x 10 each with therapist modifying resistance for appropriate challenge but to allow for reasonable success; ?Seated clams with very light manual resistance from therapist x 10; ?Seated adductor squeeze with manual resistance from therapist x 10; ?Sit to stand from regular height chair without UE support with CGA/minA+1 from therapist to stabilize for balance 2 x 10; ?Split squats in // bars with BUE support and CGA/minA+1 x 10 with each foot forward; ?Side stepping in //  bars with BUE support x 4 lengths; ?  ?  ?Neuromuscular Re-education  ?Feet apart eyes open static balance x multiple bouts, no UE support, minA+1 with occasional finger touches by pt; ?Feet together eyes open static balance x multiple bouts, no UE support, minA+1 with occasional finger touches by pt; ?Staggered stance balance alternating forward LE eyes open static balance x multiple bouts, no UE support, minA+1 with occasional finger touches by  pt; ? ? ?PATIENT EDUCATION: ?Education details: Pt educated throughout session about proper posture and technique with exercises. Improved exercise technique, movement at target joints, use of target muscles after min to mod verbal, visual, tactile cues. Reinforced importance of continued HEP ?Person educated: Patient ?Education method: Explanation and Demonstration ?Education comprehension: verbalized understanding and returned demonstration ? ? ?HOME EXERCISE PROGRAM: ?Seated, supine, and core program: Medbridge Access Code: [email or web address]  ? ? ? ? PT Short Term Goals   ? ?  ? PT SHORT TERM GOAL #1  ? Title Patient will report compliance with HEP for continued strengthening and stability during functional mobility.    ? Time 6   ? Period Weeks   ? Status Achieved   ? ?  ?  ? ?  ? ? ? PT Long Term Goals  ? ?  ? PT LONG TERM GOAL #1  ? Title Patient will increase her FOTO score to at least 59 to demonstrate increased function and mobility for higher quality of life.   ? Baseline 12/11/20: 51; 03/02/21: 49; 06/11/21: 53; 08/20/21: 53; 11/12/21: 49; 12/21/21: 50   ? Time 12   ? Period Weeks   ? Status Partially Met   ? Target Date 04/29/22   ?  ? PT LONG TERM GOAL #2  ? Title Patient will maintain Berg Balance score of at least 32/56 in order to demonstrate decreased fall risk during functional activities.   ? Baseline 12/11/20: 32/56; 03/02/21: 32/56; 06/11/21: 33/56; 08/20/2021: 73/53; 11/12/21: 29/92; 12/22/21: 34/56   ? Time 12   ? Period Weeks   ? Status On-going   ? Target Date 04/29/22   ?  ? PT LONG TERM GOAL #3  ? Title Patient will be able to maintain safe performance with ascending/descending a flight of stairs using lofstrand crutches and railing in order to allow for safe navigation of her home environment.   ? Baseline 12/11/20: currently able to use lofstrands and/or railing with supervision by family; 03/02/21: Good maintenance observed; 06/11/21: Good maintenance observed; 08/20/2021: Good maintenance observed; 11/13/21:  Good maintenance observed, family reports it continues to be the most challenging activity for patient; 12/22/21: Pt demonstrates good maintenance with stairs. She ascends/descends the stairs at home as often as she can without using her chair stairlift   ? Time 12   ? Period Weeks   ? Status On-going   ? Target Date 04/29/22   ?  ? PT LONG TERM GOAL #4  ? Title Patient will complete a TUG test in no more than 13.5 seconds for independent mobility and decreased fall risk maintenance   ? Baseline 12/11/20: 13.4s; 03/02/21: 13.5s; 06/11/21: 11.4s; 08/20/21: 12.35s; 11/12/21: 12.2s; 12/21/21: 12.0s   ? Time 12   ? Period Weeks   ? Status On-going   ? Target Date 04/29/22   ?  ? PT LONG TERM GOAL #5  ? Title Patient will increase fastest 10 meter walk test to >0.81 m/s as to improve gait speed for better community ambulation and to reduce fall risk.   ?  Baseline 12/11/20: 14.6s = 0.68 m/s; 03/02/21: 14.3s = 0.70 m/s; 06/11/21: 13.2s = 0.76 m/s; 08/20/21: 13.5s= 0.74 m/s; 11/12/21: 14.4s = 0.69 m/s; 12/21/21: 14.1s = 0.71 m/s   ? Time 12   ? Period Weeks   ? Status Partially Met   ? Target Date 04/29/22   ? ?  ?  ? ?  ? ? ? Plan   ? ? Clinical Impression Statement Pt demonstrates excellent motivation during today's session. Continued with strengthening and balance exercises. She continues to require frequent cues for adjusting exercise technique and form to encourage proper muscle activation. Challenged pt today with more body weight LE strengthening such as standard squats, split squats, and sit to stands. Pt will benefit from continued therapy as she is not able to complete all necessary exercises safely at home even with family assistance. Pt requires PT sessions to continue to maintain her functional status of living by continue to work on her LE strength, balance and gait.  Discharge attempted at the end of 2021 and she demonstrated a drop in her function during her prolonged absence out of therapy despite good consistency with  her HEP. Pt is regularly exercising with the assistance of family as needed/appropriate. She is unable to complete all necessary exercises safely at home either independently or with family assistance due to

## 2022-03-30 NOTE — Therapy (Signed)
OUTPATIENT PHYSICAL THERAPY TREATMENT NOTE/GOAL UPDATE/RECERTIFICATION   Patient Name: Michelle Randall MRN: [id number] DOB:Aug 14, 1996, 26 y.o., female Today's Date: 04/02/2022  PCP: Wayland Denis, PA-C REFERRING PROVIDER: Wayland Denis, PA-C   PT End of Session - 04/01/22 1625     Visit Number 34    Number of Visits 46    Date for PT Re-Evaluation 06/25/22    Authorization Type eval: 7/78/24, recert: 2/35/36, 1/44/31, 08/20/21, 11/11/21, 02/04/22, 04/02/22    Authorization Time Period [facility] auth 11/23/21-12/23/21 3 PT visits, 01/21/22-04/14/22 6 PT visits, Medicaid  VL: 27 combined PT/OT/SLP per year  Auth: is required    Authorization - Visit Number 6    Authorization - Number of Visits 6    PT Start Time 5400    PT Stop Time 1700    PT Time Calculation (min) 45 min    Equipment Utilized During Treatment Gait belt;Other (comment)   Bilateral AFOs, lofstrand crutches   Activity Tolerance Patient tolerated treatment well    Behavior During Therapy WFL for tasks assessed/performed                Past Medical History:  Diagnosis Date   Neurogenic bladder    Neurogenic bowel    S/P VP shunt    Spina bifida [facility])    Past Surgical History:  Procedure Laterality Date   VENTRICULOPERITONEAL SHUNT     There are no problems to display for this patient.   REFERRING DIAG: R26.81 (ICD-10-CM) - Unsteadiness on feet  THERAPY DIAG: Muscle weakness (generalized)  Unsteadiness on feet  PERTINENT HISTORY: Patient is a pleasant 26 year old female who presents for continuation of care for generalized weakness/ coordination deficits secondary to diagnosis of spina bifida. PMH includes VP shunt, spina bifida, and neurogenic bowel/bladder. Patient was discharged from this clinic at the end of 2021 due to meeting therapy cap for insurance that year. Patient reports that she has continued performing her HEP but notes a decline when she stops therapy due to inability to safely perform all of  the balance and strength exercises as at home. One of the biggest challenge she is experiencing is stair navigation She reports having multiple near falls when negotiating stairs at home. Patient must ascend/descend stairs to reach bedroom upstairs increasing her risk for falls daily.   PRECAUTIONS: Fall risk  SUBJECTIVE: Pt reports that she is doing well today. She denies any significant changes since the last therapy session. 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 She continues performing her HEP consistently and reports no issues. Family is supportive and helpful with HEP. No equipment issues at home. She received her new AFO's however they do not fit properly so she has an appointment to have them assessed and modified as nedded. No pain reported upon arrival. No specific questions or concerns currently.   PAIN:  Are you having pain? No   TODAY'S TREATMENT:      TREATMENT     Neuromuscular Re-education  Updated outcome measures and goals with patient today (see below):     OUTCOME MEASURES:   TEST 12/11/20 03/02/21 06/11/21 08/20/21 11/12/21 12/21/21 04/01/22 Interpretation  5 times sit<>stand 10.6s 9.6s 8.1s Not Tested 7.4s 8.2s 8.3s WNL  10 meter walk test 14.6s = 0.68 m/s 14.3s = 0.70 m/s 13.2s =  0.76 m/s 13.5s=0.74 m/s 14.4s = 0.69 m/s 14.1s = 0.71 m/s 13.7s = 0.73 m/s <1.0 m/s indicates increased risk for falls; limited community ambulator  Timed up and Go  13.4s 13.5s 11.4s 12.35s 12.2s 12.0s 11.3s WNL, use of lofstrand crutches, >14 sec indicates increased risk for falls  6 minute walk test Deferred Deferred Deferred Deferred Deferred Deferred Deferred 1000 feet is community ambulator  BERG Balance Assessment 32/56 32/56 33/56    34/56 34/56 34/56  35/56 <36/56 (100% risk for falls), 37-45 (80% risk for falls); 46-51 (>50% risk for falls); 52-55 (lower risk <25% of falls)  FOTO 51 49 53 53 49 50 45 Goal is to reach 59     Forward/backward stepping in //  bars with BUE support x 3 lengths each; Side stepping in // bars with BUE support x 4 lengths;     Ther-ex  NuStep BLE only L2-3 x 6 minutes for warm-up during interval history (2 minutes unbilled); Mini squats with BUE support x 10; Split squats in // bars with BUE support and CGA/minA+1 x 10 with each foot forward; 6" forward step ups alternating leading LE with BUE support x 10 on each side;    PATIENT EDUCATION: Education details: Outcome measures and goals, plan of care (frequency decreased to 1x/month), pt educated throughout session about proper posture and technique with exercises. Improved exercise technique, movement at target joints, use of target muscles after min to mod verbal, visual, tactile cues. Reinforced importance of continued HEP Person educated: Patient and mother Education method: Explanation and Demonstration Education comprehension: verbalized understanding and returned demonstration   HOME EXERCISE PROGRAM: Seated, supine, and core program: Medbridge Access CodeMardee Postin     [facility] PT Assessment - 04/01/22 1633       Berg Balance Test   Sit to Stand Able to stand without using hands and stabilize independently    Standing Unsupported Able to stand safely 2 minutes    Sitting with Back Unsupported but Feet Supported on Floor or Stool Able to sit safely and securely 2 minutes    Stand to Sit Controls descent by using hands    Transfers Able to transfer safely, definite need of hands    Standing Unsupported with Eyes Closed Able to stand 10 seconds with supervision    Standing Unsupported with Feet Together Able to place feet together independently and stand for 1 minute with supervision    From Standing, Reach Forward with Outstretched Arm Can reach forward >5 cm safely (2")    From Standing Position, Pick up Object from [place] to pick up shoe, needs supervision    From Standing Position, Turn to Look Behind Over each Shoulder Turn sideways only but  maintains balance    Turn 360 Degrees Needs assistance while turning    Standing Unsupported, Alternately Place Feet on Step/Stool Able to complete >2 steps/needs minimal assist    Standing Unsupported, One Foot in Front Able to take small step independently and hold 30 seconds    Standing on One Leg Tries to lift leg/unable to hold 3 seconds but remains standing independently    Total Score 35                 PT Short Term Goals       PT SHORT TERM GOAL #1   Title Patient will report compliance with HEP for continued strengthening and stability during functional mobility.     Time 6    Period Weeks    Status Achieved              PT Long Term Goals      PT LONG TERM GOAL #1   Title  Patient will increase her FOTO score to at least 59 to demonstrate increased function and mobility for higher quality of life.    Baseline 12/11/20: 51; 03/02/21: 49; 06/11/21: 53; 08/20/21: 53; 11/12/21: 49; 12/21/21: 50/ 04/01/22: 45   Time 12    Period Weeks    Status Partially Met    Target Date 06/25/22     PT LONG TERM GOAL #2   Title Patient will maintain Berg Balance score of at least 32/56 in order to demonstrate decreased fall risk during functional activities.    Baseline 12/11/20: 32/56; 03/02/21: 32/56; 06/11/21: 33/56; 08/20/2021: 34/56; 11/12/21: 34/56; 12/22/21: 34/56; 04/01/22: 35/56    Time 12    Period Weeks    Status On-going    Target Date 06/25/22     PT LONG TERM GOAL #3   Title Patient will be able to maintain safe performance with ascending/descending a flight of stairs using lofstrand crutches and railing in order to allow for safe navigation of her home environment.    Baseline 12/11/20: currently able to use lofstrands and/or railing with supervision by family; 03/02/21: Good maintenance observed; 06/11/21: Good maintenance observed; 08/20/2021: Good maintenance observed; 11/13/21: Good maintenance observed, family reports it continues to be the most challenging activity for  patient; 12/22/21: Pt demonstrates good maintenance with stairs. She ascends/descends the stairs at home as often as she can without using her chair stairlift; 04/01/22: Pt demonstrates good maintenance with stairs. She ascends/descends the stairs at home as often as she can without using her chair stairlift, pt also performs stairs in the community;   Time 12    Period Weeks    Status On-going    Target Date 06/25/22     PT LONG TERM GOAL #4   Title Patient will complete a TUG test in no more than 13.5 seconds for independent mobility and decreased fall risk maintenance    Baseline 12/11/20: 13.4s; 03/02/21: 13.5s; 06/11/21: 11.4s; 08/20/21: 12.35s; 11/12/21: 12.2s; 12/21/21: 12.0s; 04/01/22: 11.3s   Time 12    Period Weeks    Status On-going    Target Date 06/25/22     PT LONG TERM GOAL #5   Title Patient will increase fastest 10 meter walk test to >0.81 m/s as to improve gait speed for better community ambulation and to reduce fall risk.    Baseline 12/11/20: 14.6s = 0.68 m/s; 03/02/21: 14.3s = 0.70 m/s; 06/11/21: 13.2s = 0.76 m/s; 08/20/21: 13.5s= 0.74 m/s; 11/12/21: 14.4s = 0.69 m/s; 12/21/21: 14.1s = 0.71 m/s; 04/01/22: 13.7s = 0.73 m/s   Time 12    Period Weeks    Status Partially Met    Target Date 06/25/22             Plan     Clinical Impression Statement Pt demonstrates excellent motivation during session today. Updated outcome measures and goals during visit today. Her TUG and BERG showed excellent maintenance of her function and she demonstrates slight improvement in her stability while rotating her trunk in standing. Her 5TSTS was 8.3s today compared to 8.2s during last update. She showed a slight improvement in her 71mgait speed (0.71 m/s to 0.73 m/s) which is not a clinically significant improvement but demonstrates excellent maintenance since her last goal update. Her FOTO score dropped to 45 which indicates a decrease in her self-perceived function. Her TUG score of 11.3s is actually  the fastest time she has recorded in over 12 months. Overall pt shows nice maintenance of her functional outcome measures  which is consistent with her goals for therapy. Discharge attempted at the end of 2021 and she demonstrated a drop in her function during her prolonged absence out of therapy despite good consistency with her HEP. Pt is regularly exercising with the assistance of family as needed/appropriate. She is unable to complete all necessary exercises safely at home either independently or with family assistance due to need from physical therapist to monitor and correct form and technique as well as to provide safe guarding. Plan at this time is to decrease frequency of therapy to 1x/month to focus on modifications to her HEP, family education, and maintenance of her function. Therapist will regularly assess patient's outcome measures to ensure she does not demonstrate a decline. Depending on retesting further decisions will be made at that time regarding appropriate maintenance therapy frequency. Pt requires physical therapy intervention to maintain her functional status including LE strength, balance, and gait speed/quality and she has not achieved maximal benefit from therapy services. She will benefit from continued PT services to address these deficits and maintain her function at home and school.   Personal Factors and Comorbidities Comorbidity 1;Past/Current Experience;Time since onset of injury/illness/exacerbation;Transportation;Social Background    Comorbidities spina bifida    Examination-Activity Limitations Bend;Carry;Continence;Lift;Reach Overhead;Squat;Stairs;Stand;Transfers    Examination-Participation Restrictions Church;Community Activity;Driving;Laundry;Meal Prep;Volunteer;Yard Work    Stability/Clinical Decision Making Stable/Uncomplicated    Rehab Potential Good    Clinical Impairments Affecting Rehab Potential weakness and decreased standing balance    PT Frequency 1x/month    PT Duration 12 weeks    PT Treatment/Interventions ADLs/Self Care Home Management;Aquatic Therapy;Biofeedback; Moist Heat;Electrical Stimulation;DME Instruction;Gait training;Stair training;Functional mobility training;Neuromuscular re-education;Balance training;Therapeutic exercise;Therapeutic activities;Patient/family education;Orthotic Fit/Training;;Manual techniques;Passive range of motion;Energy conservation;Splinting;Taping;Visual/perceptual remediation/compensation;Cryotherapy    PT Next Visit Plan review and modify HEP as appropriate, balance and strength exercises    PT Home Exercise Plan Seated, supine, and core program: [email or web address]    Consulted and Agree with Plan of Care Patient              Phillips Grout PT, DPT, GCS Michelle Randall, PT 04/02/2022, 9:20 AM

## 2022-04-01 ENCOUNTER — Ambulatory Visit: Payer: Medicaid Other

## 2022-04-01 DIAGNOSIS — M6281 Muscle weakness (generalized): Secondary | ICD-10-CM

## 2022-04-01 DIAGNOSIS — R2681 Unsteadiness on feet: Secondary | ICD-10-CM

## 2022-04-29 ENCOUNTER — Ambulatory Visit: Payer: Medicaid Other

## 2022-04-30 ENCOUNTER — Ambulatory Visit: Payer: Medicaid Other | Attending: Student | Admitting: Physical Therapy

## 2022-04-30 DIAGNOSIS — R531 Weakness: Secondary | ICD-10-CM | POA: Diagnosis present

## 2022-04-30 DIAGNOSIS — R278 Other lack of coordination: Secondary | ICD-10-CM | POA: Diagnosis present

## 2022-04-30 DIAGNOSIS — Z9181 History of falling: Secondary | ICD-10-CM

## 2022-04-30 DIAGNOSIS — R262 Difficulty in walking, not elsewhere classified: Secondary | ICD-10-CM | POA: Diagnosis present

## 2022-04-30 DIAGNOSIS — R2689 Other abnormalities of gait and mobility: Secondary | ICD-10-CM

## 2022-04-30 DIAGNOSIS — R2681 Unsteadiness on feet: Secondary | ICD-10-CM

## 2022-04-30 DIAGNOSIS — R279 Unspecified lack of coordination: Secondary | ICD-10-CM | POA: Diagnosis present

## 2022-04-30 DIAGNOSIS — M6281 Muscle weakness (generalized): Secondary | ICD-10-CM | POA: Diagnosis present

## 2022-04-30 NOTE — Therapy (Signed)
OUTPATIENT PHYSICAL THERAPY TREATMENT NOTE   Patient Name: Michelle Randall MRN: [id number] DOB:1995-11-20, 26 y.o., female Today's Date: 04/30/2022  PCP: Wayland Denis, PA-C REFERRING PROVIDER: Wayland Denis, PA-C   PT End of Session - 04/30/22 1214     Visit Number 35    Number of Visits 46    Date for PT Re-Evaluation 06/25/22    Authorization Type eval: 11/25/53, recert: 12/23/00, 2/33/61, 08/20/21, 11/11/21, 02/04/22, 04/02/22    Authorization Time Period [employer] auth 11/23/21-12/23/21 3 PT visits, 01/21/22-04/14/22 6 PT visits, Medicaid  VL: 27 combined PT/OT/SLP per year  Auth: is required    Authorization - Visit Number 6    Authorization - Number of Visits 6    PT Start Time 1100    PT Stop Time 1150    PT Time Calculation (min) 50 min    Equipment Utilized During Treatment Gait belt;Other (comment)   Bilateral AFOs, lofstrand crutches   Activity Tolerance Patient tolerated treatment well    Behavior During Therapy WFL for tasks assessed/performed                 Past Medical History:  Diagnosis Date   Neurogenic bladder    Neurogenic bowel    S/P VP shunt    Spina bifida [facility])    Past Surgical History:  Procedure Laterality Date   VENTRICULOPERITONEAL SHUNT     There are no problems to display for this patient.   REFERRING DIAG: R26.81 (ICD-10-CM) - Unsteadiness on feet  THERAPY DIAG: Muscle weakness (generalized)  Unsteadiness on feet  Weakness generalized  Lack of coordination  Difficulty in walking, not elsewhere classified  Personal history of fall  Other abnormalities of gait and mobility  Other lack of coordination  PERTINENT HISTORY: Patient is a pleasant 26 year old female who presents for continuation of care for generalized weakness/ coordination deficits secondary to diagnosis of spina bifida. PMH includes VP shunt, spina bifida, and neurogenic bowel/bladder. Patient was discharged from this clinic at the end of 2021 due to meeting  therapy cap for insurance that year. Patient reports that she has continued performing her HEP but notes a decline when she stops therapy due to inability to safely perform all of the balance and strength exercises as at home. One of the biggest challenge she is experiencing is stair navigation She reports having multiple near falls when negotiating stairs at home. Patient must ascend/descend stairs to reach bedroom upstairs increasing her risk for falls daily.   PRECAUTIONS: Fall risk     OUTCOME MEASURES:   TEST 12/11/20 03/02/21 06/11/21 08/20/21 11/12/21 12/21/21 04/01/22 Interpretation  5 times sit<>stand 10.6s 9.6s 8.1s Not Tested 7.4s 8.2s 8.3s WNL  10 meter walk test 14.6s = 0.68 m/s 14.3s = 0.70 m/s 13.2s =  0.76 m/s 13.5s=0.74 m/s 14.4s = 0.69 m/s 14.1s = 0.71 m/s 13.7s = 0.73 m/s <1.0 m/s indicates increased risk for falls; limited community ambulator  Timed up and Go 13.4s 13.5s 11.4s 12.35s 12.2s 12.0s 11.3s WNL, use of lofstrand crutches, >14 sec indicates increased risk for falls  6 minute walk test _0  Deferred Deferred 1000 feet is community ambulator  BERG Balance Assessment _1   _2 35/56 <36/56 (100% risk for falls), 37-45 (80% risk for falls); 46-51 (>50% risk for falls); 52-55 (lower risk <25% of falls)  FOTO 51 49 53 53 49 50 45 Goal is to reach 59      SUBJECTIVE: Pt reports  that she is doing well today. Reports she went to a concert and stood for 2 hours during; states her legs were fatigued the following day. She is compliant with HEP. Pt had new AFOs properly fitted and is wearing them today.  No pain reported upon arrival. No specific questions or concerns currently.    PAIN:  Are you having pain? No   TODAY'S TREATMENT:     Neuromuscular Re-education  Forward/backward stepping in // bars with BUE support x 3 lengths each; Side stepping in // bars with BUE support x 3 lengths; Feet apart eyes  open static balance x 60 seconds, no UE support, CGA with occasional finger touches by pt; Feet apart eyes closed static balance x 60 seconds, no UE support, CGA with occasional finger touches by pt; Feet together eyes open static balance x 60 seconds, no UE support, CGA with occasional finger touches by pt; Feet together eyes closed static balance x 60 seconds, no UE support, CGA with occasional finger touches by pt; Feet together eyes open with vertical and horizontal head turns x 10 each, no UE support, CGA with occasional finger touches by pt;  Added to HEP:  Quadruped isometric with cueing for TA activation, x30 seconds Quadruped shoulder taps, alternating, x10 BUE     -more challenging with LUE lift  Quadruped hip extension, x10 BLE  Quadruped "crawl" 2 steps forward and back, x8 Quadruped modified push-up, x10        Ther-ex  NuStep BLE only L2-3 x 6 minutes for warm-up during interval history (2 minutes unbilled); Mini squats with BUE support x 10; 6" forward step ups alternating leading LE with BUE support x 10 on each side; Total Gym squat jumps, level 18, 3x10    -PT guiding foot placement on landing   PATIENT EDUCATION: Education details: Outcome measures and goals, plan of care (frequency decreased to 1x/month), pt educated throughout session about proper posture and technique with exercises. Improved exercise technique, movement at target joints, use of target muscles after min to mod verbal, visual, tactile cues. Reinforced importance of continued HEP Person educated: Patient and mother Education method: Explanation and Demonstration Education comprehension: verbalized understanding and returned demonstration   HOME EXERCISE PROGRAM: Seated, supine, and core program: Medbridge Access Code: [email or web address]          PT Short Term Goals       PT SHORT TERM GOAL #1   Title Patient will report compliance with HEP for continued strengthening and stability during  functional mobility.     Time 6    Period Weeks    Status Achieved              PT Long Term Goals      PT LONG TERM GOAL #1   Title Patient will increase her FOTO score to at least 59 to demonstrate increased function and mobility for higher quality of life.    Baseline 12/11/20: 51; 03/02/21: 49; 06/11/21: 53; 08/20/21: 53; 11/12/21: 49; 12/21/21: 50/ 04/01/22: 45   Time 12    Period Weeks    Status Partially Met    Target Date 06/25/22     PT LONG TERM GOAL #2   Title Patient will maintain Berg Balance score of at least 32/56 in order to demonstrate decreased fall risk during functional activities.    Baseline 12/11/20: 32/56; 03/02/21: 32/56; 06/11/21: 33/56; 08/20/2021: 34/56; 11/12/21: 34/56; 12/22/21: 34/56; 04/01/22: 35/56    Time 12    Period Weeks  Status On-going    Target Date 06/25/22     PT LONG TERM GOAL #3   Title Patient will be able to maintain safe performance with ascending/descending a flight of stairs using lofstrand crutches and railing in order to allow for safe navigation of her home environment.    Baseline 12/11/20: currently able to use lofstrands and/or railing with supervision by family; 03/02/21: Good maintenance observed; 06/11/21: Good maintenance observed; 08/20/2021: Good maintenance observed; 11/13/21: Good maintenance observed, family reports it continues to be the most challenging activity for patient; 12/22/21: Pt demonstrates good maintenance with stairs. She ascends/descends the stairs at home as often as she can without using her chair stairlift; 04/01/22: Pt demonstrates good maintenance with stairs. She ascends/descends the stairs at home as often as she can without using her chair stairlift, pt also performs stairs in the community;   Time 12    Period Weeks    Status On-going    Target Date 06/25/22     PT LONG TERM GOAL #4   Title Patient will complete a TUG test in no more than 13.5 seconds for independent mobility and decreased fall risk  maintenance    Baseline 12/11/20: 13.4s; 03/02/21: 13.5s; 06/11/21: 11.4s; 08/20/21: 12.35s; 11/12/21: 12.2s; 12/21/21: 12.0s; 04/01/22: 11.3s   Time 12    Period Weeks    Status On-going    Target Date 06/25/22     PT LONG TERM GOAL #5   Title Patient will increase fastest 10 meter walk test to >0.81 m/s as to improve gait speed for better community ambulation and to reduce fall risk.    Baseline 12/11/20: 14.6s = 0.68 m/s; 03/02/21: 14.3s = 0.70 m/s; 06/11/21: 13.2s = 0.76 m/s; 08/20/21: 13.5s= 0.74 m/s; 11/12/21: 14.4s = 0.69 m/s; 12/21/21: 14.1s = 0.71 m/s; 04/01/22: 13.7s = 0.73 m/s   Time 12    Period Weeks    Status Partially Met    Target Date 06/25/22             Plan     Clinical Impression Statement Pt demonstrates excellent motivation during session today.She accepted challenge of eyes closed during standing stability exercises well with no apparent LOB. Pt demonstrates good power through BLE during squat jumps on [employer]; PT did assist with guiding foot placement upon landing for accuracy and safety. Performed primarily standing exercises within session as she is unable to perform challenging balance exercises at home independently due to safety.  PT added quadruped exercises to HEP for core strengthening, hip strengthening and overall balance. Initially CGA provided at hips progressing to SUP as pt discovered weight-shifting to maintain balance. Previous HEP exercises remain; PT encouraged pt to rotate quadruped exercises into current routine. She will benefit from continued PT services to address these deficits and maintain her function at home and school.    Personal Factors and Comorbidities Comorbidity 1;Past/Current Experience;Time since onset of injury/illness/exacerbation;Transportation;Social Background    Comorbidities spina bifida    Examination-Activity Limitations Bend;Carry;Continence;Lift;Reach Overhead;Squat;Stairs;Stand;Transfers    Examination-Participation Restrictions  Church;Community Activity;Driving;Laundry;Meal Prep;Volunteer;Yard Work    Stability/Clinical Decision Making Stable/Uncomplicated    Rehab Potential Good    Clinical Impairments Affecting Rehab Potential weakness and decreased standing balance    PT Frequency 1x/month   PT Duration 12 weeks    PT Treatment/Interventions ADLs/Self Care Home Management;Aquatic Therapy;Biofeedback; Moist Heat;Electrical Stimulation;DME Instruction;Gait training;Stair training;Functional mobility training;Neuromuscular re-education;Balance training;Therapeutic exercise;Therapeutic activities;Patient/family education;Orthotic Fit/Training;;Manual techniques;Passive range of motion;Energy conservation;Splinting;Taping;Visual/perceptual remediation/compensation;Cryotherapy    PT Next Visit Plan review and modify  HEP as appropriate, balance and strength exercises    PT Home Exercise Plan Seated, supine, and core program: [email or web address]    Consulted and Agree with Plan of Care Patient              Patrina Levering PT, DPT

## 2022-05-27 NOTE — Therapy (Incomplete)
OUTPATIENT PHYSICAL THERAPY TREATMENT NOTE     Patient Name: Michelle Randall MRN: [id number] DOB:27-Dec-1995, 26 y.o., female Today's Date: 04/02/2022   PCP: Wayland Denis, PA-C REFERRING PROVIDER: Wayland Denis, PA-C     PT End of Session - 04/01/22 1625       Visit Number 34     Number of Visits 46     Date for PT Re-Evaluation 06/25/22     Authorization Type eval: 12/11/49, recert: 7/00/17, 4/94/49, 08/20/21, 11/11/21, 02/04/22, 04/02/22     Authorization Time Period [employer] auth 11/23/21-12/23/21 3 PT visits, 01/21/22-04/14/22 6 PT visits, Medicaid  VL: 27 combined PT/OT/SLP per year  Auth: is required     Authorization - Visit Number 6     Authorization - Number of Visits 6     PT Start Time 6759     PT Stop Time 1700     PT Time Calculation (min) 45 min     Equipment Utilized During Treatment Gait belt;Other (comment)   Bilateral AFOs, lofstrand crutches    Activity Tolerance Patient tolerated treatment well     Behavior During Therapy WFL for tasks assessed/performed                             Past Medical History:  Diagnosis Date   Neurogenic bladder     Neurogenic bowel     S/P VP shunt     Spina bifida [facility])           Past Surgical History:  Procedure Laterality Date   VENTRICULOPERITONEAL SHUNT        There are no problems to display for this patient.     REFERRING DIAG: R26.81 (ICD-10-CM) - Unsteadiness on feet   THERAPY DIAG: Muscle weakness (generalized)   Unsteadiness on feet   PERTINENT HISTORY: Patient is a pleasant 26 year old female who presents for continuation of care for generalized weakness/ coordination deficits secondary to diagnosis of spina bifida. PMH includes VP shunt, spina bifida, and neurogenic bowel/bladder. Patient was discharged from this clinic at the end of 2021 due to meeting therapy cap for insurance that year. Patient reports that she has continued performing her HEP but notes a decline when she stops therapy due to inability  to safely perform all of the balance and strength exercises as at home. One of the biggest challenge she is experiencing is stair navigation She reports having multiple near falls when negotiating stairs at home. Patient must ascend/descend stairs to reach bedroom upstairs increasing her risk for falls daily.    PRECAUTIONS: Fall risk   SUBJECTIVE: Pt reports that she is doing well today. She denies any significant changes since the last therapy session. 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 She continues performing her HEP consistently and reports no issues. Family is supportive and helpful with HEP. No equipment issues at home. She received her new AFO's however they do not fit properly so she has an appointment to have them assessed and modified as nedded. No pain reported upon arrival. No specific questions or concerns currently.    PAIN:  Are you having pain? No     TODAY'S TREATMENT:       TREATMENT     Neuromuscular Re-education  Updated outcome measures and goals with patient today (see below):     OUTCOME MEASURES:   TEST 12/11/20 03/02/21 06/11/21 08/20/21 11/12/21 12/21/21 04/01/22 Interpretation  5 times sit<>stand 10.6s  9.6s 8.1s Not Tested 7.4s 8.2s 8.3s WNL  10 meter walk test 14.6s = 0.68 m/s 14.3s = 0.70 m/s 13.2s =  0.76 m/s 13.5s=0.74 m/s 14.4s = 0.69 m/s 14.1s = 0.71 m/s 13.7s = 0.73 m/s <1.0 m/s indicates increased risk for falls; limited community ambulator  Timed up and Go 13.4s 13.5s 11.4s 12.35s 12.2s 12.0s 11.3s WNL, use of lofstrand crutches, >14 sec indicates increased risk for falls  6 minute walk test Deferred Deferred Deferred Deferred Deferred Deferred Deferred 1000 feet is community ambulator  BERG Balance Assessment 32/56 32/56 33/56    34/56 34/56 34/56  29/02 <11/15 (100% risk for falls), 37-45 (80% risk for falls); 46-51 (>50% risk for falls); 52-55 (lower risk <25% of falls)  FOTO 51 49 53 53 49 50 45 Goal is to reach 59       Forward/backward stepping in // bars with BUE support x 3 lengths each; Side stepping in // bars with BUE support x 4 lengths;     Ther-ex  NuStep BLE only L2-3 x 6 minutes for warm-up during interval history (2 minutes unbilled); Mini squats with BUE support x 10; Split squats in // bars with BUE support and CGA/minA+1 x 10 with each foot forward; 6" forward step ups alternating leading LE with BUE support x 10 on each side;     PATIENT EDUCATION: Education details: Outcome measures and goals, plan of care (frequency decreased to 1x/month), pt educated throughout session about proper posture and technique with exercises. Improved exercise technique, movement at target joints, use of target muscles after min to mod verbal, visual, tactile cues. Reinforced importance of continued HEP Person educated: Patient and mother Education method: Explanation and Demonstration Education comprehension: verbalized understanding and returned demonstration     HOME EXERCISE PROGRAM: Seated, supine, and core program: Medbridge Access CodeMardee Postin        [facility] PT Assessment - 04/01/22 1633                Berg Balance Test    Sit to Stand Able to stand without using hands and stabilize independently     Standing Unsupported Able to stand safely 2 minutes     Sitting with Back Unsupported but Feet Supported on Floor or Stool Able to sit safely and securely 2 minutes     Stand to Sit Controls descent by using hands     Transfers Able to transfer safely, definite need of hands     Standing Unsupported with Eyes Closed Able to stand 10 seconds with supervision     Standing Unsupported with Feet Together Able to place feet together independently and stand for 1 minute with supervision     From Standing, Reach Forward with Outstretched Arm Can reach forward >5 cm safely (2")     From Standing Position, Pick up Object from [place] to pick up shoe, needs supervision     From Standing Position, Turn to  Look Behind Over each Shoulder Turn sideways only but maintains balance     Turn 360 Degrees Needs assistance while turning     Standing Unsupported, Alternately Place Feet on Step/Stool Able to complete >2 steps/needs minimal assist     Standing Unsupported, One Foot in Front Able to take small step independently and hold 30 seconds     Standing on One Leg Tries to lift leg/unable to hold 3 seconds but remains standing independently     Total Score 35  PT Short Term Goals                PT SHORT TERM GOAL #1    Title Patient will report compliance with HEP for continued strengthening and stability during functional mobility.      Time 6     Period Weeks     Status Achieved                     PT Long Term Goals               PT LONG TERM GOAL #1    Title Patient will increase her FOTO score to at least 59 to demonstrate increased function and mobility for higher quality of life.     Baseline 12/11/20: 51; 03/02/21: 49; 06/11/21: 53; 08/20/21: 53; 11/12/21: 49; 12/21/21: 50/ 04/01/22: 45    Time 12     Period Weeks     Status Partially Met     Target Date 06/25/22         PT LONG TERM GOAL #2    Title Patient will maintain Berg Balance score of at least 32/56 in order to demonstrate decreased fall risk during functional activities.     Baseline 12/11/20: 32/56; 03/02/21: 32/56; 06/11/21: 33/56; 08/20/2021: 34/56; 11/12/21: 34/56; 12/22/21: 34/56; 04/01/22: 35/56     Time 12     Period Weeks     Status On-going     Target Date 06/25/22         PT LONG TERM GOAL #3    Title Patient will be able to maintain safe performance with ascending/descending a flight of stairs using lofstrand crutches and railing in order to allow for safe navigation of her home environment.     Baseline 12/11/20: currently able to use lofstrands and/or railing with supervision by family; 03/02/21: Good maintenance observed; 06/11/21: Good maintenance observed; 08/20/2021: Good  maintenance observed; 11/13/21: Good maintenance observed, family reports it continues to be the most challenging activity for patient; 12/22/21: Pt demonstrates good maintenance with stairs. She ascends/descends the stairs at home as often as she can without using her chair stairlift; 04/01/22: Pt demonstrates good maintenance with stairs. She ascends/descends the stairs at home as often as she can without using her chair stairlift, pt also performs stairs in the community;    Time 12     Period Weeks     Status On-going     Target Date 06/25/22         PT LONG TERM GOAL #4    Title Patient will complete a TUG test in no more than 13.5 seconds for independent mobility and decreased fall risk maintenance     Baseline 12/11/20: 13.4s; 03/02/21: 13.5s; 06/11/21: 11.4s; 08/20/21: 12.35s; 11/12/21: 12.2s; 12/21/21: 12.0s; 04/01/22: 11.3s    Time 12     Period Weeks     Status On-going     Target Date 06/25/22         PT LONG TERM GOAL #5    Title Patient will increase fastest 10 meter walk test to >0.81 m/s as to improve gait speed for better community ambulation and to reduce fall risk.     Baseline 12/11/20: 14.6s = 0.68 m/s; 03/02/21: 14.3s = 0.70 m/s; 06/11/21: 13.2s = 0.76 m/s; 08/20/21: 13.5s= 0.74 m/s; 11/12/21: 14.4s = 0.69 m/s; 12/21/21: 14.1s = 0.71 m/s; 04/01/22: 13.7s = 0.73 m/s    Time 12     Period Weeks  Status Partially Met     Target Date 06/25/22                    Plan       Clinical Impression Statement Pt demonstrates excellent motivation during session today. Updated outcome measures and goals during visit today. Her TUG and BERG showed excellent maintenance of her function and she demonstrates slight improvement in her stability while rotating her trunk in standing. Her 5TSTS was 8.3s today compared to 8.2s during last update. She showed a slight improvement in her 73mgait speed (0.71 m/s to 0.73 m/s) which is not a clinically significant improvement but demonstrates excellent  maintenance since her last goal update. Her FOTO score dropped to 45 which indicates a decrease in her self-perceived function. Her TUG score of 11.3s is actually the fastest time she has recorded in over 12 months. Overall pt shows nice maintenance of her functional outcome measures which is consistent with her goals for therapy. Discharge attempted at the end of 2021 and she demonstrated a drop in her function during her prolonged absence out of therapy despite good consistency with her HEP. Pt is regularly exercising with the assistance of family as needed/appropriate. She is unable to complete all necessary exercises safely at home either independently or with family assistance due to need from physical therapist to monitor and correct form and technique as well as to provide safe guarding. Plan at this time is to decrease frequency of therapy to 1x/month to focus on modifications to her HEP, family education, and maintenance of her function. Therapist will regularly assess patient's outcome measures to ensure she does not demonstrate a decline. Depending on retesting further decisions will be made at that time regarding appropriate maintenance therapy frequency. Pt requires physical therapy intervention to maintain her functional status including LE strength, balance, and gait speed/quality and she has not achieved maximal benefit from therapy services. She will benefit from continued PT services to address these deficits and maintain her function at home and school.    Personal Factors and Comorbidities Comorbidity 1;Past/Current Experience;Time since onset of injury/illness/exacerbation;Transportation;Social Background     Comorbidities spina bifida     Examination-Activity Limitations Bend;Carry;Continence;Lift;Reach Overhead;Squat;Stairs;Stand;Transfers     Examination-Participation Restrictions Church;Community Activity;Driving;Laundry;Meal Prep;Volunteer;Yard Work     Stability/Clinical Decision  Making Stable/Uncomplicated     Rehab Potential Good     Clinical Impairments Affecting Rehab Potential weakness and decreased standing balance     PT Frequency 1x/month    PT Duration 12 weeks     PT Treatment/Interventions ADLs/Self Care Home Management;Aquatic Therapy;Biofeedback; Moist Heat;Electrical Stimulation;DME Instruction;Gait training;Stair training;Functional mobility training;Neuromuscular re-education;Balance training;Therapeutic exercise;Therapeutic activities;Patient/family education;Orthotic Fit/Training;;Manual techniques;Passive range of motion;Energy conservation;Splinting;Taping;Visual/perceptual remediation/compensation;Cryotherapy     PT Next Visit Plan review and modify HEP as appropriate, balance and strength exercises     PT Home Exercise Plan Seated, supine, and core program: [email or web address]     Consulted and Agree with Plan of Care Patient

## 2022-05-28 ENCOUNTER — Ambulatory Visit: Payer: Medicaid Other | Attending: Student

## 2022-05-28 DIAGNOSIS — M6281 Muscle weakness (generalized): Secondary | ICD-10-CM | POA: Diagnosis not present

## 2022-05-28 DIAGNOSIS — R2681 Unsteadiness on feet: Secondary | ICD-10-CM | POA: Insufficient documentation

## 2022-05-28 NOTE — Therapy (Signed)
OUTPATIENT PHYSICAL THERAPY TREATMENT NOTE   Patient Name: Michelle Randall MRN: [id number] DOB:[birth year], 26 y.o., female Today's Date: 05/28/2022  PCP: Wayland Denis, PA-C REFERRING PROVIDER: Wayland Denis, PA-C   PT End of Session - 05/28/22 1110     Visit Number 36    Number of Visits 46    Date for PT Re-Evaluation 06/25/22    Authorization Type eval: 8/46/96, recert: 2/95/28, 02/26/23, 08/20/21, 11/11/21, 02/04/22, 04/02/22    Authorization Time Period 04/22/22-08/21/22 4 PT visits, Medicaid  VL: 27 combined PT/OT/SLP per year  Auth: is required    Authorization - Visit Number 2    Authorization - Number of Visits 4    PT Start Time 1105    PT Stop Time 1150    PT Time Calculation (min) 45 min    Equipment Utilized During Treatment Gait belt;Other (comment)   Bilateral AFOs, lofstrand crutches   Activity Tolerance Patient tolerated treatment well    Behavior During Therapy WFL for tasks assessed/performed                Past Medical History:  Diagnosis Date   Neurogenic bladder    Neurogenic bowel    S/P VP shunt    Spina bifida [facility])    Past Surgical History:  Procedure Laterality Date   VENTRICULOPERITONEAL SHUNT     There are no problems to display for this patient.   REFERRING DIAG: R26.81 (ICD-10-CM) - Unsteadiness on feet  THERAPY DIAG: Muscle weakness (generalized)  Unsteadiness on feet  PERTINENT HISTORY: Patient is a pleasant 26 year old female who presents for continuation of care for generalized weakness/ coordination deficits secondary to diagnosis of spina bifida. PMH includes VP shunt, spina bifida, and neurogenic bowel/bladder. Patient was discharged from this clinic at the end of 2021 due to meeting therapy cap for insurance that year. Patient reports that she has continued performing her HEP but notes a decline when she stops therapy due to inability to safely perform all of the balance and strength exercises as at home. One of the  biggest challenge she is experiencing is stair navigation She reports having multiple near falls when negotiating stairs at home. Patient must ascend/descend stairs to reach bedroom upstairs increasing her risk for falls daily.   PRECAUTIONS: Fall risk  SUBJECTIVE: Pt reports that she is doing well today. She reports that she has been very diligent with her home exercise program but she still feels like her strength has declined since decreasing the frequency of her therapy sessions. She is noticing that stairs have become progressively more challenging for her since the frequency of therapy has decreased. Family is supportive and helpful with HEP. No equipment issues at home. No pain reported upon arrival. No specific questions currently.  PAIN:  Are you having pain? No   TODAY'S TREATMENT:      TREATMENT      Ther-ex  NuStep BLE only L1 x 6 minutes for warm-up during interval history (3 minutes unbilled); Total Gym L22 BLE squats x 20, (AFOs donned); Total Gym L22 single leg squats 2 x 15 BLE (AFOs donned); Total Gym L22 double leg jumps 2 x 10 BLE (AFOs donned); Split squats in // bars with BUE support and CGA/minA+1 x 10 with each foot forward; Standing mini squats in // bars with BUE support x 10; 6" forward step ups alternating leading LE with BUE support x 10 on each side; Sit to stand from regular height chair without UE support 2 x  10; Reinforced continued performance of HEP;    PATIENT EDUCATION: Education details: Pt educated throughout session about proper posture and technique with exercises. Improved exercise technique, movement at target joints, use of target muscles after min to mod verbal, visual, tactile cues. Reinforced importance of continued HEP Person educated: Patient Education method: Customer service manager Education comprehension: verbalized understanding and returned demonstration   HOME EXERCISE PROGRAM: Seated, supine, and core program: Medbridge  Access Code: [email or web address]           PT Short Term Goals       PT SHORT TERM GOAL #1   Title Patient will report compliance with HEP for continued strengthening and stability during functional mobility.     Time 6    Period Weeks    Status Achieved              PT Long Term Goals      PT LONG TERM GOAL #1   Title Patient will increase her FOTO score to at least 59 to demonstrate increased function and mobility for higher quality of life.    Baseline 12/11/20: 51; 03/02/21: 49; 06/11/21: 53; 08/20/21: 53; 11/12/21: 49; 12/21/21: 50/ 04/01/22: 45   Time 12    Period Weeks    Status Partially Met    Target Date 06/25/22     PT LONG TERM GOAL #2   Title Patient will maintain Berg Balance score of at least 32/56 in order to demonstrate decreased fall risk during functional activities.    Baseline 12/11/20: 32/56; 03/02/21: 32/56; 06/11/21: 33/56; 08/20/2021: 34/56; 11/12/21: 34/56; 12/22/21: 34/56; 04/01/22: 35/56    Time 12    Period Weeks    Status On-going    Target Date 06/25/22     PT LONG TERM GOAL #3   Title Patient will be able to maintain safe performance with ascending/descending a flight of stairs using lofstrand crutches and railing in order to allow for safe navigation of her home environment.    Baseline 12/11/20: currently able to use lofstrands and/or railing with supervision by family; 03/02/21: Good maintenance observed; 06/11/21: Good maintenance observed; 08/20/2021: Good maintenance observed; 11/13/21: Good maintenance observed, family reports it continues to be the most challenging activity for patient; 12/22/21: Pt demonstrates good maintenance with stairs. She ascends/descends the stairs at home as often as she can without using her chair stairlift; 04/01/22: Pt demonstrates good maintenance with stairs. She ascends/descends the stairs at home as often as she can without using her chair stairlift, pt also performs stairs in the community;   Time 12    Period Weeks    Status  On-going    Target Date 06/25/22     PT LONG TERM GOAL #4   Title Patient will complete a TUG test in no more than 13.5 seconds for independent mobility and decreased fall risk maintenance    Baseline 12/11/20: 13.4s; 03/02/21: 13.5s; 06/11/21: 11.4s; 08/20/21: 12.35s; 11/12/21: 12.2s; 12/21/21: 12.0s; 04/01/22: 11.3s   Time 12    Period Weeks    Status On-going    Target Date 06/25/22     PT LONG TERM GOAL #5   Title Patient will increase fastest 10 meter walk test to >0.81 m/s as to improve gait speed for better community ambulation and to reduce fall risk.    Baseline 12/11/20: 14.6s = 0.68 m/s; 03/02/21: 14.3s = 0.70 m/s; 06/11/21: 13.2s = 0.76 m/s; 08/20/21: 13.5s= 0.74 m/s; 11/12/21: 14.4s = 0.69 m/s; 12/21/21: 14.1s = 0.71 m/s; 04/01/22: 13.7s =  0.73 m/s   Time 12    Period Weeks    Status Partially Met    Target Date 06/25/22             Plan     Clinical Impression Statement Pt demonstrates excellent motivation during session today. Session focused on strengthening today as pt reports that she feels a decline in her leg strength and more difficulty with steps despite regular and consistent performance of HEP. Family has been available and helpful with her HEP however she is unable to complete all necessary exercises safely at home either independently or with family assistance due to need from physical therapist to monitor and correct form and technique as well as to provide safe guarding. Plan at this time is to continue frequency of therapy to 1x/month to focus on modifications to her HEP, family education, and maintenance of her function. Therapist will regularly assess patient's outcome measures to ensure she does not demonstrate a decline. Depending on retesting further decisions will be made at that time regarding appropriate maintenance therapy frequency. Pt requires physical therapy intervention to maintain her functional status including LE strength, balance, and gait speed/quality and  she has not achieved maximal benefit from therapy services. She will benefit from continued PT services to address these deficits and maintain her function at home and school.   Personal Factors and Comorbidities Comorbidity 1;Past/Current Experience;Time since onset of injury/illness/exacerbation;Transportation;Social Background    Comorbidities spina bifida    Examination-Activity Limitations Bend;Carry;Continence;Lift;Reach Overhead;Squat;Stairs;Stand;Transfers    Examination-Participation Restrictions Church;Community Activity;Driving;Laundry;Meal Prep;Volunteer;Yard Work    Stability/Clinical Decision Making Stable/Uncomplicated    Rehab Potential Good    Clinical Impairments Affecting Rehab Potential weakness and decreased standing balance    PT Frequency 1x/month   PT Duration 12 weeks    PT Treatment/Interventions ADLs/Self Care Home Management;Aquatic Therapy;Biofeedback; Moist Heat;Electrical Stimulation;DME Instruction;Gait training;Stair training;Functional mobility training;Neuromuscular re-education;Balance training;Therapeutic exercise;Therapeutic activities;Patient/family education;Orthotic Fit/Training;;Manual techniques;Passive range of motion;Energy conservation;Splinting;Taping;Visual/perceptual remediation/compensation;Cryotherapy    PT Next Visit Plan review and modify HEP as appropriate, balance and strength exercises    PT Home Exercise Plan Seated, supine, and core program: [email or web address]    Consulted and Agree with Plan of Care Patient              Phillips Grout PT, DPT, GCS [place], PT 05/28/2022, 2:25 PM

## 2022-06-20 NOTE — Therapy (Signed)
OUTPATIENT PHYSICAL THERAPY TREATMENT NOTE/RECERTIFICATION   Patient Name: Michelle Randall MRN: [id number] DOB:30-May-1996, 26 y.o., female Today's Date: 06/21/2022  PCP: Wayland Denis, PA-C REFERRING PROVIDER: Wayland Denis, PA-C   PT End of Session - 06/21/22 1319     Visit Number 37    Number of Visits 52    Date for PT Re-Evaluation 09/13/22    Authorization Type eval: 9/39/03, recert: 0/09/23, 3/00/76, 08/20/21, 11/11/21, 02/04/22, 04/02/22    Authorization Time Period 04/22/22-08/21/22 4 PT visits, Medicaid  VL: 27 combined PT/OT/SLP per year  Auth: is required    Authorization - Visit Number 3    Authorization - Number of Visits 4    PT Start Time 2263    PT Stop Time 1400    PT Time Calculation (min) 43 min    Equipment Utilized During Treatment Gait belt;Other (comment)   Bilateral AFOs, lofstrand crutches   Activity Tolerance Patient tolerated treatment well    Behavior During Therapy WFL for tasks assessed/performed              Past Medical History:  Diagnosis Date   Neurogenic bladder    Neurogenic bowel    S/P VP shunt    Spina bifida [facility])    Past Surgical History:  Procedure Laterality Date   VENTRICULOPERITONEAL SHUNT     There are no problems to display for this patient.   REFERRING DIAG: R26.81 (ICD-10-CM) - Unsteadiness on feet  THERAPY DIAG: Muscle weakness (generalized)  Unsteadiness on feet  PERTINENT HISTORY: Patient is a pleasant 26 year old female who presents for continuation of care for generalized weakness/ coordination deficits secondary to diagnosis of spina bifida. PMH includes VP shunt, spina bifida, and neurogenic bowel/bladder. Patient was discharged from this clinic at the end of 2021 due to meeting therapy cap for insurance that year. Patient reports that she has continued performing her HEP but notes a decline when she stops therapy due to inability to safely perform all of the balance and strength exercises as at home. One of  the biggest challenge she is experiencing is stair navigation She reports having multiple near falls when negotiating stairs at home. Patient must ascend/descend stairs to reach bedroom upstairs increasing her risk for falls daily.   PRECAUTIONS: Fall risk  SUBJECTIVE: Pt reports that she is doing well today. She reports that she has been very diligent with her home exercise program but she still feels like her strength has declined since decreasing the frequency of her therapy sessions. She continues to report that stairs have become progressively more challenging for her since the frequency of therapy has decreased and she "gets winded" easier. Family is supportive and helpful with HEP. No equipment issues at home. No pain reported upon arrival. No specific questions currently.  PAIN:  Are you having pain? No   TODAY'S TREATMENT:      TREATMENT    Neuromuscular Re-education  Updated outcome measures and goals with patient today (see below):     OUTCOME MEASURES:   TEST 12/11/20 03/02/21 06/11/21 08/20/21 11/12/21 12/21/21 04/01/22 06/21/22 Interpretation  5 times sit<>stand 10.6s 9.6s 8.1s Not Tested 7.4s 8.2s 8.3s 8.6s WNL  10 meter walk test 14.6s = 0.68 m/s 14.3s = 0.70 m/s 13.2s =  0.76 m/s 13.5s=0.74 m/s 14.4s = 0.69 m/s 14.1s = 0.71 m/s 13.7s = 0.73 m/s 14.1s = 0.71 m/s <1.0 m/s indicates increased risk for falls; limited community ambulator  Timed up and Go 13.4s 13.5s 11.4s 12.35s 12.2s 12.0s  11.3s 13.4s WNL, use of lofstrand crutches, >14 sec indicates increased risk for falls  2 minute walk test Deferred Deferred Deferred Deferred Deferred Deferred Deferred 264' with lofstrand crutches (post: HR: 150, 99%, BORG: 5/10) 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, commercial Assessment 32/56 32/56 33/56    34/56 34/56 34/56  35/56 31/56 <36/56 (100% risk for falls), 37-45 (80% risk for falls); 46-51 (>50% risk for falls); 52-55 (lower risk <25% of falls)  FOTO 51 49 53 53 49 50 45 47 Goal is to  reach 59        Ther-ex  Mini squats with BUE support x 10; Split squats in // bars with BUE support and CGA/minA+1 x 10 with each foot forward; 6" forward step ups alternating leading LE with BUE support x 10 on each side; Side stepping in // bars with BUE support x 4 lengths; Backward stepping in // bars with BUE support x 2 lengths; Seated LAQ with manual resistance from therapist x 10; Seated hamstring curls with manual resistance from therapist x 10; Sit to stand without UE support 2 x 10; Reinforced continued performance of HEP;   PATIENT EDUCATION: Education details: Pt educated throughout session about proper posture and technique with exercises. Improved exercise technique, movement at target joints, use of target muscles after min to mod verbal, visual, tactile cues. Reinforced importance of continued HEP, outcome measures and goals Person educated: Patient and mother Education method: Explanation and Demonstration Education comprehension: verbalized understanding and returned demonstration   HOME EXERCISE PROGRAM: Seated, supine, and core program: Medbridge Access CodeMardee Postin     [facility] PT Assessment - 06/21/22 1330       Standardized Balance Assessment   Standardized Balance Assessment Berg Balance Test      Berg Balance Test   Sit to Stand Able to stand without using hands and stabilize independently    Standing Unsupported Able to stand safely 2 minutes    Sitting with Back Unsupported but Feet Supported on Floor or Stool Able to sit safely and securely 2 minutes    Stand to Sit Controls descent by using hands    Transfers Able to transfer safely, definite need of hands    Standing Unsupported with Eyes Closed Able to stand 3 seconds    Standing Unsupported with Feet Together Needs help to attain position but able to stand for 30 seconds with feet together    From Standing, Reach Forward with Outstretched Arm Can reach forward >5 cm safely (2")    From  Standing Position, Pick up Object from Floor Able to pick up shoe, needs supervision    From Standing Position, Turn to Look Behind Over each Shoulder Turn sideways only but maintains balance    Turn 360 Degrees Needs assistance while turning    Standing Unsupported, Alternately Place Feet on Step/Stool Able to complete >2 steps/needs minimal assist    Standing Unsupported, One Foot in Front Needs help to step but can hold 15 seconds    Standing on One Leg Tries to lift leg/unable to hold 3 seconds but remains standing independently    Total Score 31                  PT Short Term Goals       PT SHORT TERM GOAL #1   Title Patient will report compliance with HEP for continued strengthening and stability during functional mobility.     Time 6    Period Weeks    Status  Achieved              PT Long Term Goals      PT LONG TERM GOAL #1   Title Patient will increase her FOTO score to at least 59 to demonstrate increased function and mobility for higher quality of life.    Baseline 12/11/20: 51; 03/02/21: 49; 06/11/21: 53; 08/20/21: 53; 11/12/21: 49; 12/21/21: 50/ 04/01/22: 45; 06/21/22: 47   Time 12    Period Weeks    Status Partially Met    Target Date 09/13/22     PT LONG TERM GOAL #2   Title Patient will maintain Berg Balance score of at least 32/56 in order to demonstrate decreased fall risk during functional activities.    Baseline 12/11/20: 32/56; 03/02/21: 32/56; 06/11/21: 33/56; 08/20/2021: 34/56; 11/12/21: 34/56; 12/22/21: 34/56; 04/01/22: 35/56; 06/21/22: 31/56   Time 12    Period Weeks    Status On-going    Target Date 09/13/22     PT LONG TERM GOAL #3   Title Patient will be able to maintain safe performance with ascending/descending a flight of stairs using lofstrand crutches and railing in order to allow for safe navigation of her home environment.    Baseline 12/11/20: currently able to use lofstrands and/or railing with supervision by family; 03/02/21: Good maintenance  observed; 06/11/21: Good maintenance observed; 08/20/2021: Good maintenance observed; 11/13/21: Good maintenance observed, family reports it continues to be the most challenging activity for patient; 12/22/21: Pt demonstrates good maintenance with stairs. She ascends/descends the stairs at home as often as she can without using her chair stairlift; 04/01/22: Pt demonstrates good maintenance with stairs. She ascends/descends the stairs at home as often as she can without using her chair stairlift, pt also performs stairs in the community; 06/21/22: Pt and family reporting increased difficulty and fatigue when performing stairs at home. Pt reports that she is having more DOE   Time 12    Period Weeks    Status On-going    Target Date 09/13/22     PT LONG TERM GOAL #4   Title Patient will complete a TUG test in no more than 13.5 seconds for independent mobility and decreased fall risk maintenance    Baseline 12/11/20: 13.4s; 03/02/21: 13.5s; 06/11/21: 11.4s; 08/20/21: 12.35s; 11/12/21: 12.2s; 12/21/21: 12.0s; 04/01/22: 11.3s; 06/21/22: 13.4s   Time 12    Period Weeks    Status On-going    Target Date 09/13/22     PT LONG TERM GOAL #5   Title Patient will increase fastest 10 meter walk test to >0.81 m/s as to improve gait speed for better community ambulation and to reduce fall risk.    Baseline 12/11/20: 14.6s = 0.68 m/s; 03/02/21: 14.3s = 0.70 m/s; 06/11/21: 13.2s = 0.76 m/s; 08/20/21: 13.5s= 0.74 m/s; 11/12/21: 14.4s = 0.69 m/s; 12/21/21: 14.1s = 0.71 m/s; 04/01/22: 13.7s = 0.73 m/s; 06/21/22: 14.1s = 0.71 m/s   Time 12    Period Weeks    Status Partially Met    Target Date 09/13/22             Plan     Clinical Impression Statement Pt demonstrates excellent motivation during session today. Updated outcome measures and goals during visit today. Her TUG and BERG both worsened since they were last updated with her BERG declining from 35/56 to 31/56 and her TUG worsening from 11.3s to 13.4s. Her 5TSTS remained  relatively consistent at 8.6s today. Her 34m gait speed remained relatively consistent as well  at 14.1s = 0.71 m/s. Her FOTO score increased slightly to 47. Overall pt shows maintenance of some outcome measures and a decline in other measures (BERG and TUG). Performed a Two Minute Walk Test today in order to monitor her endurance moving forward as she is reporting worsening DOE with activity since decreasing therapy frequency. She was able to ambulate 264' today with lofstrand crutches (post vitals: HR: 150bpm, 99%, BORG: 5/10). Pt and family are reporting more difficulty with stairs at home with increased fatigue since decreasing therapy frequency. Discharge attempted at the end of 2021 and she demonstrated a drop in her function during her prolonged absence out of therapy despite good consistency with her HEP. Pt is regularly exercising at home with the assistance of family as needed/appropriate. She is unable to complete all necessary exercises safely at home either independently or with family assistance due to need from physical therapist to monitor and correct form and technique as well as to provide safe guarding. Plan at this time is to continue frequency of therapy at 1x/month to focus on modifications to her HEP, family education, and maintenance of her function. Therapist will reassess patient's outcome measures at next visit and if she continues to demonstrate decline in function will plan to increase frequency to twice/month. Pt requires physical therapy intervention to maintain her functional status including LE strength, balance, and gait speed/quality and she has not achieved maximal benefit from therapy services. She will benefit from continued PT services to address these deficits and maintain her function at home and school.   Personal Factors and Comorbidities Comorbidity 1;Past/Current Experience;Time since onset of injury/illness/exacerbation;Transportation;Social Background    Comorbidities  spina bifida    Examination-Activity Limitations Bend;Carry;Continence;Lift;Reach Overhead;Squat;Stairs;Stand;Transfers    Examination-Participation Restrictions Church;Community Activity;Driving;Laundry;Meal Prep;Volunteer;Yard Work    Stability/Clinical Decision Making Stable/Uncomplicated    Rehab Potential Good    Clinical Impairments Affecting Rehab Potential weakness and decreased standing balance    PT Frequency 1-2x/month   PT Duration 12 weeks    PT Treatment/Interventions ADLs/Self Care Home Management;Aquatic Therapy;Biofeedback; Moist Heat;Electrical Stimulation;DME Instruction;Gait training;Stair training;Functional mobility training;Neuromuscular re-education;Balance training;Therapeutic exercise;Therapeutic activities;Patient/family education;Orthotic Fit/Training;;Manual techniques;Passive range of motion;Energy conservation;Splinting;Taping;Visual/perceptual remediation/compensation;Cryotherapy    PT Next Visit Plan review and modify HEP as appropriate, balance and strength exercises    PT Home Exercise Plan Seated, supine, and core program: [email or web address]    Consulted and Agree with Plan of Care Patient              Phillips Grout PT, DPT, GCS Kris Burd, PT 06/21/2022, 2:31 PM

## 2022-06-21 ENCOUNTER — Ambulatory Visit: Payer: Medicaid Other | Attending: Student

## 2022-06-21 DIAGNOSIS — R2681 Unsteadiness on feet: Secondary | ICD-10-CM

## 2022-06-21 DIAGNOSIS — M6281 Muscle weakness (generalized): Secondary | ICD-10-CM | POA: Diagnosis present

## 2022-07-26 NOTE — Therapy (Signed)
OUTPATIENT PHYSICAL THERAPY TREATMENT NOTE/RECERTIFICATION   Patient Name: Michelle Randall MRN: [id number] DOB:[birth year], 26 y.o., female Today's Date: 07/27/2022  PCP: Wayland Denis, PA-C REFERRING PROVIDER: Wayland Denis, PA-C   PT End of Session - 07/27/22 1450     Visit Number 38    Number of Visits 52    Date for PT Re-Evaluation 09/13/22    Authorization Type eval: 6/94/50, recert: 3/88/82, 8/00/34, 08/20/21, 11/11/21, 02/04/22, 04/02/22    Authorization Time Period 04/22/22-08/21/22 4 PT visits, Medicaid  VL: 27 combined PT/OT/SLP per year  Auth: is required    Authorization - Visit Number 4    Authorization - Number of Visits 4    PT Start Time 9179    PT Stop Time 1530    PT Time Calculation (min) 43 min    Equipment Utilized During Treatment Gait belt;Other (comment)   Bilateral AFOs, lofstrand crutches   Activity Tolerance Patient tolerated treatment well    Behavior During Therapy WFL for tasks assessed/performed              Past Medical History:  Diagnosis Date   Neurogenic bladder    Neurogenic bowel    S/P VP shunt    Spina bifida [facility])    Past Surgical History:  Procedure Laterality Date   VENTRICULOPERITONEAL SHUNT     There are no problems to display for this patient.   REFERRING DIAG: R26.81 (ICD-10-CM) - Unsteadiness on feet  THERAPY DIAG: Muscle weakness (generalized)  Unsteadiness on feet  PERTINENT HISTORY: Patient is a pleasant 26 year old female who presents for continuation of care for generalized weakness/ coordination deficits secondary to diagnosis of spina bifida. PMH includes VP shunt, spina bifida, and neurogenic bowel/bladder. Patient was discharged from this clinic at the end of 2021 due to meeting therapy cap for insurance that year. Patient reports that she has continued performing her HEP but notes a decline when she stops therapy due to inability to safely perform all of the balance and strength exercises as at home. One  of the biggest challenge she is experiencing is stair navigation She reports having multiple near falls when negotiating stairs at home. Patient must ascend/descend stairs to reach bedroom upstairs increasing her risk for falls daily.   PRECAUTIONS: Fall risk  SUBJECTIVE: Difficulty transfering things from one table to the next by letting go of one lofstrand crutch. Fatigue s/p COVID and feeling out of breath with stair ascend/descend  Pt reports that she is doing well today. She reports that she has been very diligent with her home exercise program but she still feels like her strength has declined since decreasing the frequency of her therapy sessions. She continues to report that stairs have become progressively more challenging for her since the frequency of therapy has decreased and she "gets winded" easier. Family is supportive and helpful with HEP. No equipment issues at home. No pain reported upon arrival. No specific questions currently.  PAIN:  Are you having pain? No   TODAY'S TREATMENT:      TREATMENT    Neuromuscular Re-education  Updated outcome measures and goals with patient today (see below):     OUTCOME MEASURES:   TEST 12/11/20 03/02/21 06/11/21 08/20/21 11/12/21 12/21/21 04/01/22 06/21/22 07/27/22 Interpretation  5 times sit<>stand 10.6s 9.6s 8.1s Not Tested 7.4s 8.2s 8.3s 8.6s 7.7s WNL  10 meter walk test 14.6s = 0.68 m/s 14.3s = 0.70 m/s 13.2s =  0.76 m/s 13.5s=0.74 m/s 14.4s = 0.69 m/s 14.1s = 0.71  m/s 13.7s = 0.73 m/s 14.1s = 0.71 m/s 14.0s =  0.71 m/s <1.0 m/s indicates increased risk for falls; limited community ambulator  Timed up and Go 13.4s 13.5s 11.4s 12.35s 12.2s 12.0s 11.3s 13.4s 13.3s WNL, use of lofstrand crutches, >14 sec indicates increased risk for falls  2 minute walk test Deferred Deferred Deferred Deferred Deferred Deferred Deferred 4' with lofstrand crutches (post: HR: 150, 99%, BORG: 5/10) 233' with lofstrand crutches (post: HR: 145, 97%, BORG: 5/10) 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, commercial Assessment 32/56 32/56 33/56    34/56 34/56 34/56  35/56 31/56 29/56  <36/56 (100% risk for falls), 37-45 (80% risk for falls); 46-51 (>50% risk for falls); 52-55 (lower risk <25% of falls)  FOTO 51 49 53 53 49 50 45 47 45 Goal is to reach 59        Ther-ex  Mini squats with BUE support x 10; Split squats in // bars with BUE support and CGA/minA+1 x 10 with each foot forward; 6" forward step ups alternating leading LE with BUE support x 10 on each side; Side stepping in // bars with BUE support x 4 lengths; Backward stepping in // bars with BUE support x 2 lengths; Seated LAQ with manual resistance from therapist x 10; Seated hamstring curls with manual resistance from therapist x 10; Sit to stand without UE support 2 x 10; Reinforced continued performance of HEP;   PATIENT EDUCATION: Education details: Pt educated throughout session about proper posture and technique with exercises. Improved exercise technique, movement at target joints, use of target muscles after min to mod verbal, visual, tactile cues. Reinforced importance of continued HEP, outcome measures and goals Person educated: Patient and mother Education method: Explanation and Demonstration Education comprehension: verbalized understanding and returned demonstration   HOME EXERCISE PROGRAM: Seated, supine, and core program: Medbridge Access Code: [email or web address]            PT Short Term Goals       PT SHORT TERM GOAL #1   Title Patient will report compliance with HEP for continued strengthening and stability during functional mobility.     Time 6    Period Weeks    Status Achieved              PT Long Term Goals      PT LONG TERM GOAL #1   Title Patient will increase her FOTO score to at least 59 to demonstrate increased function and mobility for higher quality of life.    Baseline 12/11/20: 51; 03/02/21: 49; 06/11/21: 53; 08/20/21: 53; 11/12/21: 49; 12/21/21: 50/  04/01/22: 45; 06/21/22: 47   Time 12    Period Weeks    Status Partially Met    Target Date 09/13/22     PT LONG TERM GOAL #2   Title Patient will maintain Berg Balance score of at least 32/56 in order to demonstrate decreased fall risk during functional activities.    Baseline 12/11/20: 32/56; 03/02/21: 32/56; 06/11/21: 33/56; 08/20/2021: 34/56; 11/12/21: 34/56; 12/22/21: 34/56; 04/01/22: 35/56; 06/21/22: 31/56   Time 12    Period Weeks    Status On-going    Target Date 09/13/22     PT LONG TERM GOAL #3   Title Patient will be able to maintain safe performance with ascending/descending a flight of stairs using lofstrand crutches and railing in order to allow for safe navigation of her home environment.    Baseline 12/11/20: currently able to use lofstrands and/or railing with supervision by family;  03/02/21: Good maintenance observed; 06/11/21: Good maintenance observed; 08/20/2021: Good maintenance observed; 11/13/21: Good maintenance observed, family reports it continues to be the most challenging activity for patient; 12/22/21: Pt demonstrates good maintenance with stairs. She ascends/descends the stairs at home as often as she can without using her chair stairlift; 04/01/22: Pt demonstrates good maintenance with stairs. She ascends/descends the stairs at home as often as she can without using her chair stairlift, pt also performs stairs in the community; 06/21/22: Pt and family reporting increased difficulty and fatigue when performing stairs at home. Pt reports that she is having more DOE   Time 12    Period Weeks    Status On-going    Target Date 09/13/22     PT LONG TERM GOAL #4   Title Patient will complete a TUG test in no more than 13.5 seconds for independent mobility and decreased fall risk maintenance    Baseline 12/11/20: 13.4s; 03/02/21: 13.5s; 06/11/21: 11.4s; 08/20/21: 12.35s; 11/12/21: 12.2s; 12/21/21: 12.0s; 04/01/22: 11.3s; 06/21/22: 13.4s   Time 12    Period Weeks    Status On-going    Target  Date 09/13/22     PT LONG TERM GOAL #5   Title Patient will increase fastest 10 meter walk test to >0.81 m/s as to improve gait speed for better community ambulation and to reduce fall risk.    Baseline 12/11/20: 14.6s = 0.68 m/s; 03/02/21: 14.3s = 0.70 m/s; 06/11/21: 13.2s = 0.76 m/s; 08/20/21: 13.5s= 0.74 m/s; 11/12/21: 14.4s = 0.69 m/s; 12/21/21: 14.1s = 0.71 m/s; 04/01/22: 13.7s = 0.73 m/s; 06/21/22: 14.1s = 0.71 m/s   Time 12    Period Weeks    Status Partially Met    Target Date 09/13/22             Plan     Clinical Impression Statement Pt demonstrates excellent motivation during session today. Updated outcome measures and goals during visit today. Her TUG and BERG both worsened since they were last updated with her BERG declining from 35/56 to 31/56 and her TUG worsening from 11.3s to 13.4s. Her 5TSTS remained relatively consistent at 8.6s today. Her 82mgait speed remained relatively consistent as well at 14.1s = 0.71 m/s. Her FOTO score increased slightly to 47. Overall pt shows maintenance of some outcome measures and a decline in other measures (BERG and TUG). Performed a Two Minute Walk Test today in order to monitor her endurance moving forward as she is reporting worsening DOE with activity since decreasing therapy frequency. She was able to ambulate 264' today with lofstrand crutches (post vitals: HR: 150bpm, 99%, BORG: 5/10). Pt and family are reporting more difficulty with stairs at home with increased fatigue since decreasing therapy frequency. Discharge attempted at the end of 2021 and she demonstrated a drop in her function during her prolonged absence out of therapy despite good consistency with her HEP. Pt is regularly exercising at home with the assistance of family as needed/appropriate. She is unable to complete all necessary exercises safely at home either independently or with family assistance due to need from physical therapist to monitor and correct form and technique as well  as to provide safe guarding. Plan at this time is to continue frequency of therapy at 1x/month to focus on modifications to her HEP, family education, and maintenance of her function. Therapist will reassess patient's outcome measures at next visit and if she continues to demonstrate decline in function will plan to increase frequency to twice/month. Pt requires physical  therapy intervention to maintain her functional status including LE strength, balance, and gait speed/quality and she has not achieved maximal benefit from therapy services. She will benefit from continued PT services to address these deficits and maintain her function at home and school.   Personal Factors and Comorbidities Comorbidity 1;Past/Current Experience;Time since onset of injury/illness/exacerbation;Transportation;Social Background    Comorbidities spina bifida    Examination-Activity Limitations Bend;Carry;Continence;Lift;Reach Overhead;Squat;Stairs;Stand;Transfers    Examination-Participation Restrictions Church;Community Activity;Driving;Laundry;Meal Prep;Volunteer;Yard Work    Stability/Clinical Decision Making Stable/Uncomplicated    Rehab Potential Good    Clinical Impairments Affecting Rehab Potential weakness and decreased standing balance    PT Frequency 1-2x/month   PT Duration 12 weeks    PT Treatment/Interventions ADLs/Self Care Home Management;Aquatic Therapy;Biofeedback; Moist Heat;Electrical Stimulation;DME Instruction;Gait training;Stair training;Functional mobility training;Neuromuscular re-education;Balance training;Therapeutic exercise;Therapeutic activities;Patient/family education;Orthotic Fit/Training;;Manual techniques;Passive range of motion;Energy conservation;Splinting;Taping;Visual/perceptual remediation/compensation;Cryotherapy    PT Next Visit Plan review and modify HEP as appropriate, balance and strength exercises    PT Home Exercise Plan Seated, supine, and core program: [email or web address]    Consulted  and Agree with Plan of Care Patient              Phillips Grout PT, DPT, GCS Destynie Toomey, PT 07/27/2022, 2:56 PM

## 2022-07-27 ENCOUNTER — Ambulatory Visit: Payer: Medicaid Other | Attending: Student

## 2022-07-27 DIAGNOSIS — M6281 Muscle weakness (generalized): Secondary | ICD-10-CM | POA: Diagnosis present

## 2022-07-27 DIAGNOSIS — R2681 Unsteadiness on feet: Secondary | ICD-10-CM | POA: Insufficient documentation

## 2022-08-12 ENCOUNTER — Ambulatory Visit: Payer: Medicaid Other

## 2022-08-12 DIAGNOSIS — R2681 Unsteadiness on feet: Secondary | ICD-10-CM

## 2022-08-12 DIAGNOSIS — M6281 Muscle weakness (generalized): Secondary | ICD-10-CM | POA: Diagnosis not present

## 2022-08-12 NOTE — Therapy (Signed)
OUTPATIENT PHYSICAL THERAPY TREATMENT NOTE   Patient Name: Michelle Randall MRN: [id number] DOB:[birth year], 26 y.o., female Today's Date: 08/13/2022  PCP: Wayland Denis, PA-C REFERRING PROVIDER: Wayland Denis, PA-C   PT End of Session - 08/12/22 1321     Visit Number 39    Number of Visits 52    Date for PT Re-Evaluation 09/13/22    Authorization Type eval: 07/13/99, recert: 3/49/17, 07/30/04, 08/20/21, 11/11/21, 02/04/22, 04/02/22, 06/21/22;    Authorization Time Period 08/09/22-10/31/22 12 PT visits, Medicaid  VL: 27 combined PT/OT/SLP per year  Auth: is required    Authorization - Visit Number 1    Authorization - Number of Visits 12    PT Start Time 6979    PT Stop Time 1400    PT Time Calculation (min) 44 min    Equipment Utilized During Treatment Gait belt;Other (comment)   Bilateral AFOs, lofstrand crutches   Activity Tolerance Patient tolerated treatment well    Behavior During Therapy WFL for tasks assessed/performed              Past Medical History:  Diagnosis Date   Neurogenic bladder    Neurogenic bowel    S/P VP shunt    Spina bifida [facility])    Past Surgical History:  Procedure Laterality Date   VENTRICULOPERITONEAL SHUNT     There are no problems to display for this patient.   REFERRING DIAG: R26.81 (ICD-10-CM) - Unsteadiness on feet  THERAPY DIAG: Muscle weakness (generalized)  Unsteadiness on feet  PERTINENT HISTORY: Patient is a pleasant 26 year old female who presents for continuation of care for generalized weakness/ coordination deficits secondary to diagnosis of spina bifida. PMH includes VP shunt, spina bifida, and neurogenic bowel/bladder. Patient was discharged from this clinic at the end of 2021 due to meeting therapy cap for insurance that year. Patient reports that she has continued performing her HEP but notes a decline when she stops therapy due to inability to safely perform all of the balance and strength exercises as at home. One of  the biggest challenge she is experiencing is stair navigation She reports having multiple near falls when negotiating stairs at home. Patient must ascend/descend stairs to reach bedroom upstairs increasing her risk for falls daily.   PRECAUTIONS: Fall risk OUTCOME MEASURES:   TEST 12/11/20 03/02/21 06/11/21 08/20/21 11/12/21 12/21/21 04/01/22 06/21/22 07/27/22 Interpretation  5 times sit<>stand 10.6s 9.6s 8.1s Not Tested 7.4s 8.2s 8.3s 8.6s 7.7s WNL  10 meter walk test 14.6s = 0.68 m/s 14.3s = 0.70 m/s 13.2s =  0.76 m/s 13.5s=0.74 m/s 14.4s = 0.69 m/s 14.1s = 0.71 m/s 13.7s = 0.73 m/s 14.1s = 0.71 m/s 14.0s =  0.71 m/s <1.0 m/s indicates increased risk for falls; limited community ambulator  Timed up and Go 13.4s 13.5s 11.4s 12.35s 12.2s 12.0s 11.3s 13.4s 13.3s WNL, use of lofstrand crutches, >14 sec indicates increased risk for falls  2 minute walk test Deferred Deferred Deferred Deferred Deferred Deferred Deferred 264' with lofstrand crutches (post: HR: 150, 99%, BORG: 5/10) 233' with lofstrand crutches (post: HR: 145, 97%, BORG: 5/10) 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 She has been very diligent with her home exercise program and her family remains supportive and helpful with her HEP. She continues noticing more instability when letting go of her lofstrand crutch in order to use her hands and would like to focus her session today on her balance. No equipment issues at home. No pain reported upon arrival. No specific questions currently.   PAIN:  Are you having pain? No   TREATMENT     Neuromuscular Re-education  All exercises performed in // bars without UE  support unless otherwise specified; Forward/retro in // bars with faded UE support progressing from double to single UE support x multiple laps; Forward/retro in // bars with single UE support holding a cone in the contralateral hand x multiple laps; Forward/retro in // bars with single UE support holding a cone in the contralateral hand while balancing ball on top x multiple laps; Feet apart static balance x 60s; Feet apart balance with horizontal and vertical head turns x 60s each; Staggered stance static balance alternating forward LE x 60s each;    Ther-ex  NuStep L1-2 BLE only x 5 minutes for warm-up during interval history intake; Total Gym (TG) Level 22 (L22) double leg squats 2 x 15; TG L22 single leg squats 2 x 15 BLE; TG L22 double leg jumps 2 x 10; Reinforced continued performance of HEP;   PATIENT EDUCATION: Education details: Pt educated throughout session about proper posture and technique with exercises. Improved exercise technique, movement at target joints, use of target muscles after min to mod verbal, visual, tactile cues. Reinforced importance of continued HEP Person educated: Patient and mother Education method: Explanation and Demonstration Education comprehension: verbalized understanding and returned demonstration   HOME EXERCISE PROGRAM: Seated, supine, and core program: Medbridge Access Code: [email or web address]        PT Short Term Goals       PT SHORT TERM GOAL #1   Title Patient will report compliance with HEP for continued strengthening and stability during functional mobility.     Time 6    Period Weeks    Status Achieved              PT Long Term Goals      PT LONG TERM GOAL #1   Title Patient will increase her FOTO score to at least 59 to demonstrate increased function and mobility for higher quality of life.    Baseline 12/11/20: 51; 03/02/21: 49; 06/11/21: 53; 08/20/21: 53; 11/12/21: 49; 12/21/21: 50/ 04/01/22: 45; 06/21/22: 47; 07/27/22: 45   Time 12     Period Weeks    Status Partially Met    Target Date 09/13/22     PT LONG TERM GOAL #2   Title Patient will maintain Berg Balance score of at least 32/56 in order to demonstrate decreased fall risk during functional activities.    Baseline 12/11/20: 32/56; 03/02/21: 32/56; 06/11/21: 33/56; 08/20/2021: 34/56; 11/12/21: 34/56; 12/22/21: 34/56; 04/01/22: 35/56; 06/21/22: 31/56; 07/27/22: 29/56   Time 12    Period Weeks    Status On-going    Target Date 09/13/22     PT LONG TERM GOAL #3   Title Patient will be able to maintain safe performance with ascending/descending a flight of stairs using lofstrand crutches and railing in order to allow for safe navigation of her home environment.    Baseline 12/11/20: currently able to use lofstrands and/or railing with supervision by family; 03/02/21: Good maintenance  observed; 06/11/21: Good maintenance observed; 08/20/2021: Good maintenance observed; 11/13/21: Good maintenance observed, family reports it continues to be the most challenging activity for patient; 12/22/21: Pt demonstrates good maintenance with stairs. She ascends/descends the stairs at home as often as she can without using her chair stairlift; 04/01/22: Pt demonstrates good maintenance with stairs. She ascends/descends the stairs at home as often as she can without using her chair stairlift, pt also performs stairs in the community; 06/21/22: Pt and family reporting increased difficulty and fatigue when performing stairs at home. Pt reports that she is having more DOE; 07/27/22: Pt and family reporting more difficulty and fatigue when performing stairs at home as well as worsening DOE   Time 12    Period Weeks    Status On-going    Target Date 09/13/22     PT LONG TERM GOAL #4   Title Patient will complete a TUG test in no more than 13.5 seconds for independent mobility and decreased fall risk maintenance    Baseline 12/11/20: 13.4s; 03/02/21: 13.5s; 06/11/21: 11.4s; 08/20/21: 12.35s; 11/12/21: 12.2s; 12/21/21:  12.0s; 04/01/22: 11.3s; 06/21/22: 13.4s; 07/27/22: 13.3s   Time 12    Period Weeks    Status On-going    Target Date 09/13/22     PT LONG TERM GOAL #5   Title Patient will increase fastest 10 meter walk test to >0.81 m/s as to improve gait speed for better community ambulation and to reduce fall risk.    Baseline 12/11/20: 14.6s = 0.68 m/s; 03/02/21: 14.3s = 0.70 m/s; 06/11/21: 13.2s = 0.76 m/s; 08/20/21: 13.5s= 0.74 m/s; 11/12/21: 14.4s = 0.69 m/s; 12/21/21: 14.1s = 0.71 m/s; 04/01/22: 13.7s = 0.73 m/s; 06/21/22: 14.1s = 0.71 m/s; 07/27/22: 14.0s =  0.71 m/s   Time 12    Period Weeks    Status Partially Met    Target Date 09/13/22             Plan     Clinical Impression Statement Pt demonstrates excellent motivation during session today. She continues noticing more instability when letting go of her lofstrand crutch in order to use her hands so session today focused more on her balance. Practiced gait with faded UE support and also incorporated dual motor tasks such as carrying a cone and balancing a ball on top. This is moderately challenging for patient. Pt is regularly exercising at home with the assistance of family as needed/appropriate. She is unable to complete all necessary exercises safely at home either independently or with family assistance due to need from physical therapist to monitor and correct form and technique as well as to provide safe guarding. At this time therapist recommends continuing frequency of therapy to 1x/week for the remainder of 2023 due to patient's decline in function since therapy was decreased due to pressure from Medicaid as well as recent COVID infection. Plan will be to return to therapy every other week in 2024 with focus on modifications to her HEP, family education, and maintenance of her function. Therapist will reassess patient's outcome measures at regular intervals to assess need for continued maintenance therapy frequency. Pt requires physical therapy  intervention to maintain her functional status including LE strength, balance, and gait speed/quality and she has not achieved maximal benefit from therapy services. She will benefit from continued PT services to address these deficits and maintain her function at home and school.   Personal Factors and Comorbidities Comorbidity 1;Past/Current Experience;Time since onset of injury/illness/exacerbation;Transportation;Social Background    Comorbidities spina bifida  Examination-Activity Limitations Bend;Carry;Continence;Lift;Reach Overhead;Squat;Stairs;Stand;Transfers    Examination-Participation Restrictions Church;Community Activity;Driving;Laundry;Meal Prep;Volunteer;Yard Work    Stability/Clinical Decision Making Stable/Uncomplicated    Rehab Potential Good    Clinical Impairments Affecting Rehab Potential weakness and decreased standing balance    PT Frequency 1x/week   PT Duration 12 weeks    PT Treatment/Interventions ADLs/Self Care Home Management;Aquatic Therapy;Biofeedback; Moist Heat;Electrical Stimulation;DME Instruction;Gait training;Stair training;Functional mobility training;Neuromuscular re-education;Balance training;Therapeutic exercise;Therapeutic activities;Patient/family education;Orthotic Fit/Training;;Manual techniques;Passive range of motion;Energy conservation;Splinting;Taping;Visual/perceptual remediation/compensation;Cryotherapy    PT Next Visit Plan review and modify HEP as appropriate, balance and strength exercises    PT Home Exercise Plan Seated, supine, and core program: [email or web address]    Consulted and Agree with Plan of Care Patient              Phillips Grout PT, DPT, GCS Issaac Shipper, PT 08/13/2022, 9:51 AM

## 2022-08-17 ENCOUNTER — Ambulatory Visit: Payer: Medicaid Other

## 2022-08-19 ENCOUNTER — Ambulatory Visit: Payer: Medicaid Other | Attending: Student

## 2022-08-19 DIAGNOSIS — R531 Weakness: Secondary | ICD-10-CM | POA: Diagnosis present

## 2022-08-19 DIAGNOSIS — R2681 Unsteadiness on feet: Secondary | ICD-10-CM | POA: Diagnosis present

## 2022-08-19 DIAGNOSIS — M6281 Muscle weakness (generalized): Secondary | ICD-10-CM | POA: Diagnosis present

## 2022-08-19 NOTE — Therapy (Signed)
OUTPATIENT PHYSICAL THERAPY TREATMENT NOTE/PROGRESS NOTE  Dates of reporting period  02/04/22   to   08/19/22   Patient Name: Michelle Randall MRN: [id number] DOB:Apr 03, 1996, 26 y.o., female Today's Date: 08/19/2022  PCP: Wayland Denis, PA-C REFERRING PROVIDER: Wayland Denis, PA-C   PT End of Session - 08/19/22 1022     Visit Number 40    Number of Visits 76    Date for PT Re-Evaluation 09/13/22    Authorization Type eval: 2/42/68, recert: 3/41/96, 01/07/96, 08/20/21, 11/11/21, 02/04/22, 04/02/22, 06/21/22;    Authorization Time Period 08/09/22-10/31/22 12 PT visits, Medicaid  VL: 27 combined PT/OT/SLP per year  Auth: is required    Authorization - Visit Number 2    Authorization - Number of Visits 12    PT Start Time 1017    PT Stop Time 1100    PT Time Calculation (min) 43 min    Equipment Utilized During Treatment Gait belt;Other (comment)   Bilateral AFOs, lofstrand crutches   Activity Tolerance Patient tolerated treatment well    Behavior During Therapy WFL for tasks assessed/performed             Past Medical History:  Diagnosis Date   Neurogenic bladder    Neurogenic bowel    S/P VP shunt    Spina bifida [facility])    Past Surgical History:  Procedure Laterality Date   VENTRICULOPERITONEAL SHUNT     There are no problems to display for this patient.   REFERRING DIAG: R26.81 (ICD-10-CM) - Unsteadiness on feet  THERAPY DIAG: Muscle weakness (generalized)  Unsteadiness on feet  PERTINENT HISTORY: Patient is a pleasant 26 year old female who presents for continuation of care for generalized weakness/ coordination deficits secondary to diagnosis of spina bifida. PMH includes VP shunt, spina bifida, and neurogenic bowel/bladder. Patient was discharged from this clinic at the end of 2021 due to meeting therapy cap for insurance that year. Patient reports that she has continued performing her HEP but notes a decline when she stops therapy due to inability to safely  perform all of the balance and strength exercises as at home. One of the biggest challenge she is experiencing is stair navigation She reports having multiple near falls when negotiating stairs at home. Patient must ascend/descend stairs to reach bedroom upstairs increasing her risk for falls daily.   PRECAUTIONS: Fall risk OUTCOME MEASURES:   TEST 12/11/20 03/02/21 06/11/21 08/20/21 11/12/21 12/21/21 04/01/22 06/21/22 07/27/22 Interpretation  5 times sit<>stand 10.6s 9.6s 8.1s Not Tested 7.4s 8.2s 8.3s 8.6s 7.7s WNL  10 meter walk test 14.6s = 0.68 m/s 14.3s = 0.70 m/s 13.2s =  0.76 m/s 13.5s=0.74 m/s 14.4s = 0.69 m/s 14.1s = 0.71 m/s 13.7s = 0.73 m/s 14.1s = 0.71 m/s 14.0s =  0.71 m/s <1.0 m/s indicates increased risk for falls; limited community ambulator  Timed up and Go 13.4s 13.5s 11.4s 12.35s 12.2s 12.0s 11.3s 13.4s 13.3s WNL, use of lofstrand crutches, >14 sec indicates increased risk for falls  2 minute walk test [employer] Def[employer] 264' with lofstrand crutches (post: HR: 150, 99%, BORG: 5/10) 233' with lofstrand crutches (post: HR: 145, 97%, BORG: 5/10) 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 She has been very diligent with her home exercise program and her family remains supportive and helpful with her HEP as needed. She has been performing her exercises mostly at night before bed. No significant changes since the last therapy session. No equipment issues at home. No pain reported upon arrival. No specific questions currently.   PAIN:  Are you having pain? No   TREATMENT     Neuromuscular Re-education  All exercises  performed in // bars without UE support unless otherwise specified; Feet apart dynamic reaching in multiple directions x multiple bouts with each UE; Feet apart 1# dumbbell (DB) alternating shoulder forward flexion x 10 BUE; Feet apart 1# DB alternating shoulder abduction x 10 BUE; Forward/retro in // bars with single UE support holding a cone in the contralateral hand while balancing ball on top x multiple laps; Feet apart static balance x 60s; Feet apart balance with horizontal and vertical head turns x 60s each; Staggered stance static balance alternating forward LE x 60s each; Stepping over 1"x4" planks in parallel bars x multiple lengths with faded UE support progressing from BUE to single UE support;    Ther-ex  NuStep L1-2 BLE only x 5 minutes for warm-up during interval history intake; 6" step-ups alternating leading LE x 10 on each side; Stair training 4 steps x 2, with pt using BUE rail support and step-to pattern; Split squats alternating forward LE x 10 each; Reinforced continued performance of HEP;   Not performed: Total Gym (TG) Level 22 (L22) double leg squats 2 x 15; TG L22 single leg squats 2 x 15 BLE; TG L22 double leg jumps 2 x 10; Retro gait in // bars with faded UE support progressing from double to single UE support x multiple laps;   PATIENT EDUCATION: Education details: Pt educated throughout session about proper posture and technique with exercises. Improved exercise technique, movement at target joints, use of target muscles after min to mod verbal, visual, tactile cues. Reinforced importance of continued HEP Person educated: Patient and mother Education method: Explanation and Demonstration Education comprehension: verbalized understanding and returned demonstration   HOME EXERCISE PROGRAM: Seated, supine, and core program: Medbridge Access Code: [email or web address]        PT Short Term Goals       PT SHORT TERM GOAL #1   Title Patient will report  compliance with HEP for continued strengthening and stability during functional mobility.     Time 6    Period Weeks    Status Achieved              PT Long Term Goals      PT LONG TERM GOAL #1   Title Patient will increase her FOTO score to at least 59 to demonstrate increased function and mobility for higher quality of life.    Baseline 12/11/20: 51; 03/02/21: 49; 06/11/21: 53; 08/20/21: 53; 11/12/21: 49; 12/21/21: 50/ 04/01/22: 45; 06/21/22: 47; 07/27/22: 45   Time 12    Period Weeks    Status Partially Met    Target Date 09/13/22     PT LONG TERM GOAL #2   Title Patient will maintain Berg Balance score of at least 32/56 in order to demonstrate decreased fall risk during functional activities.    Baseline 12/11/20: 32/56; 03/02/21: 32/56; 06/11/21: 33/56; 08/20/2021: 01/00; 11/12/21: 71/21; 12/22/21: 97/58; 04/01/22: 35/56; 06/21/22:  31/56; 07/27/22: 29/56   Time 12    Period Weeks    Status On-going    Target Date 09/13/22     PT LONG TERM GOAL #3   Title Patient will be able to maintain safe performance with ascending/descending a flight of stairs using lofstrand crutches and railing in order to allow for safe navigation of her home environment.    Baseline 12/11/20: currently able to use lofstrands and/or railing with supervision by family; 03/02/21: Good maintenance observed; 06/11/21: Good maintenance observed; 08/20/2021: Good maintenance observed; 11/13/21: Good maintenance observed, family reports it continues to be the most challenging activity for patient; 12/22/21: Pt demonstrates good maintenance with stairs. She ascends/descends the stairs at home as often as she can without using her chair stairlift; 04/01/22: Pt demonstrates good maintenance with stairs. She ascends/descends the stairs at home as often as she can without using her chair stairlift, pt also performs stairs in the community; 06/21/22: Pt and family reporting increased difficulty and fatigue when performing stairs at home. Pt  reports that she is having more DOE; 07/27/22: Pt and family reporting more difficulty and fatigue when performing stairs at home as well as worsening DOE   Time 12    Period Weeks    Status On-going    Target Date 09/13/22     PT LONG TERM GOAL #4   Title Patient will complete a TUG test in no more than 13.5 seconds for independent mobility and decreased fall risk maintenance    Baseline 12/11/20: 13.4s; 03/02/21: 13.5s; 06/11/21: 11.4s; 08/20/21: 12.35s; 11/12/21: 12.2s; 12/21/21: 12.0s; 04/01/22: 11.3s; 06/21/22: 13.4s; 07/27/22: 13.3s   Time 12    Period Weeks    Status On-going    Target Date 09/13/22     PT LONG TERM GOAL #5   Title Patient will increase fastest 10 meter walk test to >0.81 m/s as to improve gait speed for better community ambulation and to reduce fall risk.    Baseline 12/11/20: 14.6s = 0.68 m/s; 03/02/21: 14.3s = 0.70 m/s; 06/11/21: 13.2s = 0.76 m/s; 08/20/21: 13.5s= 0.74 m/s; 11/12/21: 14.4s = 0.69 m/s; 12/21/21: 14.1s = 0.71 m/s; 04/01/22: 13.7s = 0.73 m/s; 06/21/22: 14.1s = 0.71 m/s; 07/27/22: 14.0s =  0.71 m/s   Time 12    Period Weeks    Status Partially Met    Target Date 09/13/22             Plan     Clinical Impression Statement Pt demonstrates excellent motivation during session today. Updated outcome measures and goals during 07/27/22 visit. Her TUG remained worsened since her therapy frequency was decreased. Her BERG also continued to worsen and was 29/56 compared to 35/56 on 04/01/22. This is concerning for considerable decline in her balance and increase in her fall risk. Her 5TSTS remained relatively consistent and actually improved slightly compared to the prior update. Her 62mgait speed also remained consistent at 14.0s = 0.71 m/s. Patient's FOTO score declined slightly to 45 and her Two Minute Walk Test declined by 31.' Overall pt showed maintenance of some outcome measures and a decline in other measures. Since her frequency was decreased the most significant decline  has been in her balance. This is consistent with the patient's family reporting more difficulty with stairs at home with increased fatigue. Discharge attempted at the end of 2021 and pt demonstrated a drop in her function during her prolonged absence out of therapy despite good consistency with her HEP. Frequency was also decreased from  every other week to once per month in an attempt to maintain as much as possible and unfortunately this has resulted in an unacceptable decline in patient's function. Pt is regularly exercising at home with the assistance of family as needed/appropriate. She is unable to complete all necessary exercises safely at home either independently or with family assistance due to need from physical therapist to monitor and correct form and technique as well as to provide safe guarding. At this time therapist recommends increasing frequency of therapy to 1x/week for the remainder of 2023 due to patient's decline in function since therapy was decreased due to pressure from Medicaid as well as recent COVID infection. Plan will be to return to therapy every other week in 2024 with focus on modifications to her HEP, family education, and maintenance of her function. Therapist will reassess patient's outcome measures at regular intervals to assess need for continued maintenance therapy frequency. Pt requires physical therapy intervention to maintain her functional status including LE strength, balance, and gait speed/quality and she has not achieved maximal benefit from therapy services. She will benefit from continued PT services to address these deficits and maintain her function at home and school.   Personal Factors and Comorbidities Comorbidity 1;Past/Current Experience;Time since onset of injury/illness/exacerbation;Transportation;Social Background    Comorbidities spina bifida    Examination-Activity Limitations Bend;Carry;Continence;Lift;Reach Overhead;Squat;Stairs;Stand;Transfers     Examination-Participation Restrictions Church;Community Activity;Driving;Laundry;Meal Prep;Volunteer;Yard Work    Stability/Clinical Decision Making Stable/Uncomplicated    Rehab Potential Good    Clinical Impairments Affecting Rehab Potential weakness and decreased standing balance    PT Frequency 1x/week   PT Duration 12 weeks    PT Treatment/Interventions ADLs/Self Care Home Management;Aquatic Therapy;Biofeedback; Moist Heat;Electrical Stimulation;DME Instruction;Gait training;Stair training;Functional mobility training;Neuromuscular re-education;Balance training;Therapeutic exercise;Therapeutic activities;Patient/family education;Orthotic Fit/Training;;Manual techniques;Passive range of motion;Energy conservation;Splinting;Taping;Visual/perceptual remediation/compensation;Cryotherapy    PT Next Visit Plan review and modify HEP as appropriate, balance and strength exercises    PT Home Exercise Plan Seated, supine, and core program: [email or web address]    Consulted and Agree with Plan of Care Patient             Phillips Grout PT, DPT, [facility] Shreyan Hinz, PT 08/19/2022, 9:32 PM

## 2022-08-25 NOTE — Therapy (Signed)
OUTPATIENT PHYSICAL THERAPY TREATMENT NOTE  Patient Name: Michelle Randall MRN: [id number] DOB:1995/11/25, 26 y.o., female Today's Date: 08/26/2022  PCP: Wayland Denis, PA-C REFERRING PROVIDER: Wayland Denis, PA-C   PT End of Session - 08/26/22 1154     Visit Number 41    Number of Visits 80    Date for PT Re-Evaluation 09/13/22    Authorization Type eval: 9/35/70, recert: 1/77/93, 07/19/99, 08/20/21, 11/11/21, 02/04/22, 04/02/22, 06/21/22;    Authorization Time Period 08/09/22-10/31/22 12 PT visits, Medicaid  VL: 27 combined PT/OT/SLP per year  Auth: is required    Authorization - Visit Number 3    Authorization - Number of Visits 12    PT Start Time 9233    PT Stop Time 1230    PT Time Calculation (min) 42 min    Equipment Utilized During Treatment Gait belt;Other (comment)   Bilateral AFOs, lofstrand crutches   Activity Tolerance Patient tolerated treatment well    Behavior During Therapy WFL for tasks assessed/performed            Past Medical History:  Diagnosis Date   Neurogenic bladder    Neurogenic bowel    S/P VP shunt    Spina bifida [facility])    Past Surgical History:  Procedure Laterality Date   VENTRICULOPERITONEAL SHUNT     There are no problems to display for this patient.   REFERRING DIAG: R26.81 (ICD-10-CM) - Unsteadiness on feet  THERAPY DIAG: Muscle weakness (generalized)  Unsteadiness on feet  PERTINENT HISTORY: Patient is a pleasant 26 year old female who presents for continuation of care for generalized weakness/ coordination deficits secondary to diagnosis of spina bifida. PMH includes VP shunt, spina bifida, and neurogenic bowel/bladder. Patient was discharged from this clinic at the end of 2021 due to meeting therapy cap for insurance that year. Patient reports that she has continued performing her HEP but notes a decline when she stops therapy due to inability to safely perform all of the balance and strength exercises as at home. One of the  biggest challenge she is experiencing is stair navigation She reports having multiple near falls when negotiating stairs at home. Patient must ascend/descend stairs to reach bedroom upstairs increasing her risk for falls daily.   PRECAUTIONS: Fall risk OUTCOME MEASURES:   TEST 12/11/20 03/02/21 06/11/21 08/20/21 11/12/21 12/21/21 04/01/22 06/21/22 07/27/22 Interpretation  5 times sit<>stand 10.6s 9.6s 8.1s Not Tested 7.4s 8.2s 8.3s 8.6s 7.7s WNL  10 meter walk test 14.6s = 0.68 m/s 14.3s = 0.70 m/s 13.2s =  0.76 m/s 13.5s=0.74 m/s 14.4s = 0.69 m/s 14.1s = 0.71 m/s 13.7s = 0.73 m/s 14.1s = 0.71 m/s 14.0s =  0.71 m/s <1.0 m/s indicates increased risk for falls; limited community ambulator  Timed up and Go 13.4s 13.5s 11.4s 12.35s 12.2s 12.0s 11.3s 13.4s 13.3s WNL, use of lofstrand crutches, >14 sec indicates increased risk for falls  2 minute walk test _0  [employer] 264' with lofstrand crutches (post: HR: 150, 99%, BORG: 5/10) 233' with lofstrand crutches (post: HR: 145, 97%, BORG: 5/10) 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 She is completing her HEP without issue.No significant changes since the last therapy session. No equipment issues at home. No pain reported upon arrival. No specific questions currently.   PAIN:  Are you having pain? No   TREATMENT     Neuromuscular Re-education  NuStep L1-2 BLE only x 5 minutes for warm-up during interval history intake (2 minutes unbilled);  All balance exercises performed in // bars without UE support unless otherwise specified; Feet apart static balance eyes open/closed x 30s  each; Feet apart dynamic reaching in multiple directions x multiple bouts with each UE; Ball reaches passes around body to therapist with BUE and return pass on opposite side with therapist varying height/distance x multiple bouts on each side; Forward/Retro gait in // bars with BUE support x multiple laps each; Side stepping in // bars with BUE support x multiple laps; Lunge walks forward in // bars x 2 lengths; Staggered stance static balance alternating forward LE x 30s each; Feet apart cone stacking alternating UE and alternating ipsilateral vs contralateral reaching x multiple bouts with each UE; BOSU (round side up) step-ups alternating leading LE with BUE support x 10 on each side; Reinforced continued performance of HEP;   Not performed: Total Gym (TG) Level 22 (L22) double leg squats 2 x 15; TG L22 single leg squats 2 x 15 BLE; TG L22 double leg jumps 2 x 10; Feet apart 1# dumbbell (DB) alternating shoulder forward flexion x 10 BUE; Feet apart 1# DB alternating shoulder abduction x 10 BUE; Forward/retro in // bars with single UE support holding a cone in the contralateral hand while balancing ball on top x multiple laps; Feet apart static balance x 60s; Feet apart balance with horizontal and vertical head turns x 60s each; Stepping over 1"x4" planks in parallel bars x multiple lengths with faded UE support progressing from BUE to single UE support; Stair training 4 steps x 2, with pt using BUE rail support and step-to pattern; Split squats alternating forward LE x 10 each;    PATIENT EDUCATION: Education details: Pt educated throughout session about proper posture and technique with exercises. Improved exercise technique, movement at target joints, use of target muscles after min to mod verbal, visual, tactile cues. Reinforced importance of continued HEP Person educated: Patient and mother Education method: Explanation and Demonstration Education comprehension: verbalized  understanding and returned demonstration   HOME EXERCISE PROGRAM: Seated, supine, and core program: Medbridge Access Code: [email or web address]        PT Short Term Goals       PT SHORT TERM GOAL #1   Title Patient will report compliance with HEP for continued strengthening and stability during functional mobility.     Time 6    Period Weeks    Status Achieved              PT Long Term Goals      PT LONG TERM GOAL #1   Title Patient will increase her FOTO score to at least 59 to demonstrate increased function and mobility for higher quality of life.    Baseline 12/11/20: 51; 03/02/21: 49; 06/11/21: 53; 08/20/21: 53; 11/12/21: 49; 12/21/21: 50/ 04/01/22: 45; 06/21/22: 47; 07/27/22: 45   Time 12    Period Weeks    Status Partially Met    Target Date 09/13/22     PT LONG TERM GOAL #2   Title Patient will maintain Berg Balance score of at least 32/56  in order to demonstrate decreased fall risk during functional activities.    Baseline 12/11/20: 32/56; 03/02/21: 32/56; 06/11/21: 33/56; 08/20/2021: 34/56; 11/12/21: 34/56; 12/22/21: 34/56; 04/01/22: 35/56; 06/21/22: 31/56; 07/27/22: 29/56   Time 12    Period Weeks    Status On-going    Target Date 09/13/22     PT LONG TERM GOAL #3   Title Patient will be able to maintain safe performance with ascending/descending a flight of stairs using lofstrand crutches and railing in order to allow for safe navigation of her home environment.    Baseline 12/11/20: currently able to use lofstrands and/or railing with supervision by family; 03/02/21: Good maintenance observed; 06/11/21: Good maintenance observed; 08/20/2021: Good maintenance observed; 11/13/21: Good maintenance observed, family reports it continues to be the most challenging activity for patient; 12/22/21: Pt demonstrates good maintenance with stairs. She ascends/descends the stairs at home as often as she can without using her chair stairlift; 04/01/22: Pt demonstrates good maintenance with stairs. She  ascends/descends the stairs at home as often as she can without using her chair stairlift, pt also performs stairs in the community; 06/21/22: Pt and family reporting increased difficulty and fatigue when performing stairs at home. Pt reports that she is having more DOE; 07/27/22: Pt and family reporting more difficulty and fatigue when performing stairs at home as well as worsening DOE   Time 12    Period Weeks    Status On-going    Target Date 09/13/22     PT LONG TERM GOAL #4   Title Patient will complete a TUG test in no more than 13.5 seconds for independent mobility and decreased fall risk maintenance    Baseline 12/11/20: 13.4s; 03/02/21: 13.5s; 06/11/21: 11.4s; 08/20/21: 12.35s; 11/12/21: 12.2s; 12/21/21: 12.0s; 04/01/22: 11.3s; 06/21/22: 13.4s; 07/27/22: 13.3s   Time 12    Period Weeks    Status On-going    Target Date 09/13/22     PT LONG TERM GOAL #5   Title Patient will increase fastest 10 meter walk test to >0.81 m/s as to improve gait speed for better community ambulation and to reduce fall risk.    Baseline 12/11/20: 14.6s = 0.68 m/s; 03/02/21: 14.3s = 0.70 m/s; 06/11/21: 13.2s = 0.76 m/s; 08/20/21: 13.5s= 0.74 m/s; 11/12/21: 14.4s = 0.69 m/s; 12/21/21: 14.1s = 0.71 m/s; 04/01/22: 13.7s = 0.73 m/s; 06/21/22: 14.1s = 0.71 m/s; 07/27/22: 14.0s =  0.71 m/s   Time 12    Period Weeks    Status Partially Met    Target Date 09/13/22             Plan     Clinical Impression Statement Pt demonstrates excellent motivation during session today. Continued to focus primarily on balance exercises as these are the most challenging to reproduce at home without the verbal and tactile cues from therapist for proper technique. She is demonstrating improvement in stability in just a few short weeks of regular therapy sessions focused on her balance. Discharge attempted at the end of 2021 and pt demonstrated a drop in her function during her prolonged absence out of therapy despite good consistency with her HEP.  Frequency was also decreased from every other week to once per month in an attempt to maintain as much as possible and unfortunately this has resulted in an unacceptable decline in patient's function. Pt is regularly exercising at home with the assistance of family as needed/appropriate. She is unable to complete all necessary exercises safely at home either independently or with family  assistance due to need from physical therapist to monitor and correct form and technique as well as to provide safe guarding. At this time therapist recommends continuing frequency of therapy to 1x/week for the remainder of 2023 due to patient's decline in function since therapy was decreased due to pressure from Medicaid as well as recent COVID infection. Plan will be to return to therapy every other week in 2024 with focus on modifications to her HEP, family education, and maintenance of her function. Therapist will reassess patient's outcome measures at regular intervals to assess need for continued maintenance therapy frequency. Pt requires physical therapy intervention to maintain her functional status including LE strength, balance, and gait speed/quality and she has not achieved maximal benefit from therapy services. She will benefit from continued PT services to address these deficits and maintain her function at home and school.   Personal Factors and Comorbidities Comorbidity 1;Past/Current Experience;Time since onset of injury/illness/exacerbation;Transportation;Social Background    Comorbidities spina bifida    Examination-Activity Limitations Bend;Carry;Continence;Lift;Reach Overhead;Squat;Stairs;Stand;Transfers    Examination-Participation Restrictions Church;Community Activity;Driving;Laundry;Meal Prep;Volunteer;Yard Work    Stability/Clinical Decision Making Stable/Uncomplicated    Rehab Potential Good    Clinical Impairments Affecting Rehab Potential weakness and decreased standing balance    PT Frequency  1x/week   PT Duration 12 weeks    PT Treatment/Interventions ADLs/Self Care Home Management;Aquatic Therapy;Biofeedback; Moist Heat;Electrical Stimulation;DME Instruction;Gait training;Stair training;Functional mobility training;Neuromuscular re-education;Balance training;Therapeutic exercise;Therapeutic activities;Patient/family education;Orthotic Fit/Training;;Manual techniques;Passive range of motion;Energy conservation;Splinting;Taping;Visual/perceptual remediation/compensation;Cryotherapy    PT Next Visit Plan review and modify HEP as appropriate, balance and strength exercises    PT Home Exercise Plan Seated, supine, and core program: [email or web address]    Consulted and Agree with Plan of Care Patient             Phillips Grout PT, DPT, [facility] Bentleigh Waren, PT 08/26/2022, 1:21 PM

## 2022-08-26 ENCOUNTER — Ambulatory Visit: Payer: Medicaid Other

## 2022-08-26 DIAGNOSIS — M6281 Muscle weakness (generalized): Secondary | ICD-10-CM

## 2022-08-26 DIAGNOSIS — R2681 Unsteadiness on feet: Secondary | ICD-10-CM

## 2022-09-02 ENCOUNTER — Ambulatory Visit: Payer: Medicaid Other

## 2022-09-02 DIAGNOSIS — M6281 Muscle weakness (generalized): Secondary | ICD-10-CM

## 2022-09-02 DIAGNOSIS — R2681 Unsteadiness on feet: Secondary | ICD-10-CM

## 2022-09-09 ENCOUNTER — Ambulatory Visit: Payer: Medicaid Other

## 2022-09-09 DIAGNOSIS — R531 Weakness: Secondary | ICD-10-CM

## 2022-09-09 DIAGNOSIS — R2681 Unsteadiness on feet: Secondary | ICD-10-CM

## 2022-09-09 DIAGNOSIS — M6281 Muscle weakness (generalized): Secondary | ICD-10-CM

## 2022-09-09 NOTE — Therapy (Signed)
OUTPATIENT PHYSICAL THERAPY TREATMENT NOTE  Patient Name: Michelle Randall MRN: [id number] DOB:01/26/1996, 26 y.o., female Today's Date: 09/10/2022  PCP: Wayland Denis, PA-C REFERRING PROVIDER: Wayland Denis, PA-C   PT End of Session - 09/09/22 1540     Visit Number 42    Number of Visits 52    Date for PT Re-Evaluation 09/13/22    Authorization Type eval: 03/02/39, recert: 06/29/47, 1/85/63, 08/20/21, 11/11/21, 02/04/22, 04/02/22, 06/21/22;    Authorization Time Period 08/09/22-10/31/22 12 PT visits, Medicaid  VL: 27 combined PT/OT/SLP per year  Auth: is required    Authorization - Visit Number 4    Authorization - Number of Visits 12    PT Start Time 1497    PT Stop Time 1615    PT Time Calculation (min) 40 min    Equipment Utilized During Treatment Gait belt;Other (comment)   Bilateral AFOs, lofstrand crutches   Activity Tolerance Patient tolerated treatment well    Behavior During Therapy WFL for tasks assessed/performed             Past Medical History:  Diagnosis Date   Neurogenic bladder    Neurogenic bowel    S/P VP shunt    Spina bifida [facility])    Past Surgical History:  Procedure Laterality Date   VENTRICULOPERITONEAL SHUNT     There are no problems to display for this patient.   REFERRING DIAG: R26.81 (ICD-10-CM) - Unsteadiness on feet  THERAPY DIAG: Muscle weakness (generalized)  Unsteadiness on feet  Weakness generalized  PERTINENT HISTORY: Patient is a pleasant 26 year old female who presents for continuation of care for generalized weakness/ coordination deficits secondary to diagnosis of spina bifida. PMH includes VP shunt, spina bifida, and neurogenic bowel/bladder. Patient was discharged from this clinic at the end of 2021 due to meeting therapy cap for insurance that year. Patient reports that she has continued performing her HEP but notes a decline when she stops therapy due to inability to safely perform all of the balance and strength exercises  as at home. One of the biggest challenge she is experiencing is stair navigation She reports having multiple near falls when negotiating stairs at home. Patient must ascend/descend stairs to reach bedroom upstairs increasing her risk for falls daily.   PRECAUTIONS: Fall risk OUTCOME MEASURES:   TEST 12/11/20 03/02/21 06/11/21 08/20/21 11/12/21 12/21/21 04/01/22 06/21/22 07/27/22 Interpretation  5 times sit<>stand 10.6s 9.6s 8.1s Not Tested 7.4s 8.2s 8.3s 8.6s 7.7s WNL  10 meter walk test 14.6s = 0.68 m/s 14.3s = 0.70 m/s 13.2s =  0.76 m/s 13.5s=0.74 m/s 14.4s = 0.69 m/s 14.1s = 0.71 m/s 13.7s = 0.73 m/s 14.1s = 0.71 m/s 14.0s =  0.71 m/s <1.0 m/s indicates increased risk for falls; limited community ambulator  Timed up and Go 13.4s 13.5s 11.4s 12.35s 12.2s 12.0s 11.3s 13.4s 13.3s WNL, use of lofstrand crutches, >14 sec indicates increased risk for falls  2 minute walk test [employer] Def[employer] 264' with lofstrand crutches (post: HR: 150, 99%, BORG: 5/10) 233' with lofstrand crutches (post: HR: 145, 97%, BORG: 5/10) 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 She is completing her HEP without issue.No significant changes since the last therapy session. No equipment issues at home. No pain reported upon arrival. No specific questions currently.   PAIN:  Are you having pain? No   TREATMENT     Neuromuscular Re-education  NuStep L1-2 BLE only x 5 minutes for warm-up during interval history intake (2 minutes unbilled);  All balance exercises performed in // bars without UE support unless otherwise specified; Feet together static  balance eyes open/closed x 60s each; Feet apart balance with horizontal and vertical head turns x 60s each; Side stepping in // bars with BUE support x multiple laps; Staggered stance static balance alternating forward LE x 60s each;   Ther-ex  Sit to stand from regular height chair without UE support 2 x 10; 6" alternating step-ups with BUE support x 10 with each leg; Split squats alternating forward LE x 10 each; Mini squats with BUE support x 10; Reinforced continued performance of HEP;   Not performed: Total Gym (TG) Level 22 (L22) double leg squats 2 x 15; TG L22 single leg squats 2 x 15 BLE; TG L22 double leg jumps 2 x 10; Feet apart 1# dumbbell (DB) alternating shoulder forward flexion x 10 BUE; Feet apart 1# DB alternating shoulder abduction x 10 BUE; Forward/retro in // bars with single UE support holding a cone in the contralateral hand while balancing ball on top x multiple laps; Stepping over 1"x4" planks in parallel bars x multiple lengths with faded UE support progressing from BUE to single UE support; Feet apart dynamic reaching in multiple directions x multiple bouts with each UE; Ball reaches passes around body to therapist with BUE and return pass on opposite side with therapist varying height/distance x multiple bouts on each side; Forward/Retro gait in // bars with BUE support x multiple laps each; Feet apart cone stacking alternating UE and alternating ipsilateral vs contralateral reaching x multiple bouts with each UE; BOSU (round side up) step-ups alternating leading LE with BUE support x 10 on each side;    PATIENT EDUCATION: Education details: Pt educated throughout session about proper posture and technique with exercises. Improved exercise technique, movement at target joints, use of target muscles after min to mod verbal, visual, tactile cues. Reinforced importance of continued HEP Person educated: Patient Education method: Holiday representative Education comprehension: verbalized understanding and returned demonstration   HOME EXERCISE PROGRAM: Seated, supine, and core program: Medbridge Access Code: [email or web address]        PT Short Term Goals       PT SHORT TERM GOAL #1   Title Patient will report compliance with HEP for continued strengthening and stability during functional mobility.     Time 6    Period Weeks    Status Achieved              PT Long Term Goals      PT LONG TERM GOAL #1   Title Patient will increase her FOTO score to at least 59 to demonstrate increased function and mobility for higher quality of life.    Baseline 12/11/20: 51; 03/02/21: 49; 06/11/21: 53; 08/20/21: 53; 11/12/21: 49; 12/21/21: 50/ 04/01/22: 45; 06/21/22: 47; 07/27/22: 45   Time 12    Period Weeks    Status Partially Met    Target Date 09/13/22     PT LONG TERM GOAL #2   Title Patient will maintain Berg Balance score of  at least 32/56 in order to demonstrate decreased fall risk during functional activities.    Baseline 12/11/20: 32/56; 03/02/21: 32/56; 06/11/21: 33/56; 08/20/2021: 34/56; 11/12/21: 34/56; 12/22/21: 34/56; 04/01/22: 35/56; 06/21/22: 31/56; 07/27/22: 29/56   Time 12    Period Weeks    Status On-going    Target Date 09/13/22     PT LONG TERM GOAL #3   Title Patient will be able to maintain safe performance with ascending/descending a flight of stairs using lofstrand crutches and railing in order to allow for safe navigation of her home environment.    Baseline 12/11/20: currently able to use lofstrands and/or railing with supervision by family; 03/02/21: Good maintenance observed; 06/11/21: Good maintenance observed; 08/20/2021: Good maintenance observed; 11/13/21: Good maintenance observed, family reports it continues to be the most challenging activity for patient; 12/22/21: Pt demonstrates good maintenance with stairs. She ascends/descends the stairs at home as often as she can without using her chair stairlift; 04/01/22: Pt  demonstrates good maintenance with stairs. She ascends/descends the stairs at home as often as she can without using her chair stairlift, pt also performs stairs in the community; 06/21/22: Pt and family reporting increased difficulty and fatigue when performing stairs at home. Pt reports that she is having more DOE; 07/27/22: Pt and family reporting more difficulty and fatigue when performing stairs at home as well as worsening DOE   Time 12    Period Weeks    Status On-going    Target Date 09/13/22     PT LONG TERM GOAL #4   Title Patient will complete a TUG test in no more than 13.5 seconds for independent mobility and decreased fall risk maintenance    Baseline 12/11/20: 13.4s; 03/02/21: 13.5s; 06/11/21: 11.4s; 08/20/21: 12.35s; 11/12/21: 12.2s; 12/21/21: 12.0s; 04/01/22: 11.3s; 06/21/22: 13.4s; 07/27/22: 13.3s   Time 12    Period Weeks    Status On-going    Target Date 09/13/22     PT LONG TERM GOAL #5   Title Patient will increase fastest 10 meter walk test to >0.81 m/s as to improve gait speed for better community ambulation and to reduce fall risk.    Baseline 12/11/20: 14.6s = 0.68 m/s; 03/02/21: 14.3s = 0.70 m/s; 06/11/21: 13.2s = 0.76 m/s; 08/20/21: 13.5s= 0.74 m/s; 11/12/21: 14.4s = 0.69 m/s; 12/21/21: 14.1s = 0.71 m/s; 04/01/22: 13.7s = 0.73 m/s; 06/21/22: 14.1s = 0.71 m/s; 07/27/22: 14.0s =  0.71 m/s   Time 12    Period Weeks    Status Partially Met    Target Date 09/13/22             Plan     Clinical Impression Statement Pt demonstrates excellent motivation during session today. Continued to focus balance and LE exercises. Balance exercises are the most challenging to reproduce at home without the verbal and tactile cues from therapist for proper technique. No HEP modifications on this date. Discharge attempted at the end of 2021 and pt demonstrated a drop in her function during her prolonged absence out of therapy despite good consistency with her HEP. Frequency was also decreased from every  other week to once per month in an attempt to maintain as much as possible and unfortunately this has resulted in an unacceptable decline in patient's function. Pt is regularly exercising at home with the assistance of family as needed/appropriate. She is unable to complete all necessary exercises safely at home either independently or with family assistance due to need from physical therapist to monitor and correct  form and technique as well as to provide safe guarding. At this time therapist recommends continuing frequency of therapy to 1x/week for the remainder of 2023 due to patient's decline in function since therapy was decreased due to pressure from Medicaid as well as recent COVID infection. Plan will be to return to therapy every other week in 2024 with focus on modifications to her HEP, family education, and maintenance of her function. Therapist will reassess patient's outcome measures at regular intervals to assess need for continued maintenance therapy frequency. Pt requires physical therapy intervention to maintain her functional status including LE strength, balance, and gait speed/quality and she has not achieved maximal benefit from therapy services. She will benefit from continued PT services to address these deficits and maintain her function at home and school.   Personal Factors and Comorbidities Comorbidity 1;Past/Current Experience;Time since onset of injury/illness/exacerbation;Transportation;Social Background    Comorbidities spina bifida    Examination-Activity Limitations Bend;Carry;Continence;Lift;Reach Overhead;Squat;Stairs;Stand;Transfers    Examination-Participation Restrictions Church;Community Activity;Driving;Laundry;Meal Prep;Volunteer;Yard Work    Stability/Clinical Decision Making Stable/Uncomplicated    Rehab Potential Good    Clinical Impairments Affecting Rehab Potential weakness and decreased standing balance    PT Frequency 1x/week   PT Duration 12 weeks    PT  Treatment/Interventions ADLs/Self Care Home Management;Aquatic Therapy;Biofeedback; Moist Heat;Electrical Stimulation;DME Instruction;Gait training;Stair training;Functional mobility training;Neuromuscular re-education;Balance training;Therapeutic exercise;Therapeutic activities;Patient/family education;Orthotic Fit/Training;;Manual techniques;Passive range of motion;Energy conservation;Splinting;Taping;Visual/perceptual remediation/compensation;Cryotherapy    PT Next Visit Plan review and modify HEP as appropriate, balance and strength exercises    PT Home Exercise Plan Seated, supine, and core program: [email or web address]    Consulted and Agree with Plan of Care Patient             Phillips Grout PT, DPT, GCS Masen Salvas, PT 09/10/2022, 1:01 PM

## 2022-09-16 ENCOUNTER — Ambulatory Visit: Payer: Medicaid Other | Attending: Student

## 2022-09-16 DIAGNOSIS — R2681 Unsteadiness on feet: Secondary | ICD-10-CM | POA: Diagnosis present

## 2022-09-16 DIAGNOSIS — M6281 Muscle weakness (generalized): Secondary | ICD-10-CM | POA: Insufficient documentation

## 2022-09-16 NOTE — Therapy (Signed)
OUTPATIENT PHYSICAL THERAPY TREATMENT NOTE/RECERTIFICATION  Patient Name: Michelle Randall MRN: [id number] DOB:October 01, 1996, 26 y.o., female Today's Date: 09/17/2022  PCP: Wayland Denis, PA-C REFERRING PROVIDER: Wayland Denis, PA-C   PT End of Session - 09/17/22 1105     Visit Number 43    Number of Visits 68    Date for PT Re-Evaluation 12/10/22    Authorization Type eval: 7/32/20, recert: 2/54/27, 0/62/37, 08/20/21, 11/11/21, 02/04/22, 04/02/22, 06/21/22;    Authorization Time Period 08/09/22-10/31/22 12 PT visits, Medicaid  VL: 27 combined PT/OT/SLP per year  Auth: is required    Authorization - Visit Number 5    Authorization - Number of Visits 12    PT Start Time 6283    PT Stop Time 1615    PT Time Calculation (min) 40 min    Equipment Utilized During Treatment Gait belt;Other (comment)   Bilateral AFOs, lofstrand crutches   Activity Tolerance Patient tolerated treatment well    Behavior During Therapy WFL for tasks assessed/performed            Past Medical History:  Diagnosis Date   Neurogenic bladder    Neurogenic bowel    S/P VP shunt    Spina bifida [facility])    Past Surgical History:  Procedure Laterality Date   VENTRICULOPERITONEAL SHUNT     There are no problems to display for this patient.   REFERRING DIAG: R26.81 (ICD-10-CM) - Unsteadiness on feet  THERAPY DIAG: Muscle weakness (generalized)  Unsteadiness on feet  PERTINENT HISTORY: Patient is a pleasant 25 year old female who presents for continuation of care for generalized weakness/ coordination deficits secondary to diagnosis of spina bifida. PMH includes VP shunt, spina bifida, and neurogenic bowel/bladder. Patient was discharged from this clinic at the end of 2021 due to meeting therapy cap for insurance that year. Patient reports that she has continued performing her HEP but notes a decline when she stops therapy due to inability to safely perform all of the balance and strength exercises as at  home. One of the biggest challenge she is experiencing is stair navigation She reports having multiple near falls when negotiating stairs at home. Patient must ascend/descend stairs to reach bedroom upstairs increasing her risk for falls daily.   PRECAUTIONS: Fall risk OUTCOME MEASURES:   TEST 12/11/20 03/02/21 06/11/21 08/20/21 11/12/21 12/21/21 04/01/22 06/21/22 07/27/22 Interpretation  5 times sit<>stand 10.6s 9.6s 8.1s Not Tested 7.4s 8.2s 8.3s 8.6s 7.7s WNL  10 meter walk test 14.6s = 0.68 m/s 14.3s = 0.70 m/s 13.2s =  0.76 m/s 13.5s=0.74 m/s 14.4s = 0.69 m/s 14.1s = 0.71 m/s 13.7s = 0.73 m/s 14.1s = 0.71 m/s 14.0s =  0.71 m/s <1.0 m/s indicates increased risk for falls; limited community ambulator  Timed up and Go 13.4s 13.5s 11.4s 12.35s 12.2s 12.0s 11.3s 13.4s 13.3s WNL, use of lofstrand crutches, >14 sec indicates increased risk for falls  2 minute walk test _0  [employer] 264' with lofstrand crutches (post: HR: 150, 99%, BORG: 5/10) 233' with lofstrand crutches (post: HR: 145, 97%, BORG: 5/10) 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 She is completing her HEP without issue.No significant changes since the last therapy session. She reports continued difficulty operating her stair lift when balance with only one Lofstrand crutch. All equipment is functioning well at home. No pain reported upon arrival. No specific questions currently.   PAIN:  Are you having pain? No   TREATMENT     Neuromuscular Re-education  NuStep L1-3 BLE only x 5 minutes for warm-up during interval history intake (2 minutes unbilled);  All  balance exercises performed in // bars without UE support unless otherwise specified; Feet apart dynamic reaching in multiple directions x multiple bouts with each UE; Attempted ambulation with single Lofstrand crutch in RUE but it is too difficult to advance LLE so discontinued; Airex feet apart with single LUE lofstrand crutch with horizontal and vertical head turns x 45s each; Airex feet apart with single LUE lofstrand crutch with cone stacking requiring reaching in multiple directions, crossing midline, and challenging forward reach x multiple bouts; Airex feet apart with balloon taps with second assist 2 x 60s;   Ther-ex  Total Gym (TG) Level 22 (L22) double leg squats x 10; TG L22 single leg squats x 10 BLE; TG L22 double leg jumps x 10; 6" alternating step-ups at stairs with BUE support on handrails x 10 with each leg; Sit to stand from regular height chair without UE support 2 x 10; Reinforced continued performance of HEP;   Not performed: Split squats alternating forward LE x 10 each; Mini squats with BUE support x 10; Feet apart 1# dumbbell (DB) alternating shoulder forward flexion x 10 BUE; Feet apart 1# DB alternating shoulder abduction x 10 BUE; Forward/retro in // bars with single UE support holding a cone in the contralateral hand while balancing ball on top x multiple laps; Stepping over 1"x4" planks in parallel bars x multiple lengths with faded UE support progressing from BUE to single UE support; Ball reaches passes around body to therapist with BUE and return pass on opposite side with therapist varying height/distance x multiple bouts on each side; Forward/Retro gait in // bars with BUE support x multiple laps each; Feet apart cone stacking alternating UE and alternating ipsilateral vs contralateral reaching x multiple bouts with each UE; BOSU (round side up) step-ups alternating leading LE with BUE support x 10 on each side; Feet together static balance eyes  open/closed x 60s each; Feet apart balance with horizontal and vertical head turns x 60s each; Side stepping in // bars with BUE support x multiple laps; Staggered stance static balance alternating forward LE x 60s each;   PATIENT EDUCATION: Education details: Pt educated throughout session about proper posture and technique with exercises. Improved exercise technique, movement at target joints, use of target muscles after min to mod verbal, visual, tactile cues. Reinforced importance of continued HEP Person educated: Patient Education method: Customer service manager Education comprehension: verbalized understanding and returned demonstration   HOME EXERCISE PROGRAM: Seated, supine, and core program: Medbridge Access Code: [email or web address]        PT Short Term Goals       PT SHORT TERM GOAL #1   Title Patient will report compliance with HEP for continued strengthening and stability during functional mobility.     Time 6    Period Weeks    Status Achieved              PT Long Term Goals      PT LONG  TERM GOAL #1   Title Patient will increase her FOTO score to at least 59 to demonstrate increased function and mobility for higher quality of life.    Baseline 12/11/20: 51; 03/02/21: 49; 06/11/21: 53; 08/20/21: 53; 11/12/21: 49; 12/21/21: 50/ 04/01/22: 45; 06/21/22: 47; 07/27/22: 45   Time 12    Period Weeks    Status Partially Met    Target Date 12/10/2022      PT LONG TERM GOAL #2   Title Patient will maintain Berg Balance score of at least 32/56 in order to demonstrate decreased fall risk during functional activities.    Baseline 12/11/20: 32/56; 03/02/21: 32/56; 06/11/21: 33/56; 08/20/2021: 34/56; 11/12/21: 34/56; 12/22/21: 34/56; 04/01/22: 35/56; 06/21/22: 31/56; 07/27/22: 29/56   Time 12    Period Weeks    Status On-going    Target Date 12/10/2022      PT LONG TERM GOAL #3   Title Patient will be able to maintain safe performance with ascending/descending a flight of stairs using  lofstrand crutches and railing in order to allow for safe navigation of her home environment.    Baseline 12/11/20: currently able to use lofstrands and/or railing with supervision by family; 03/02/21: Good maintenance observed; 06/11/21: Good maintenance observed; 08/20/2021: Good maintenance observed; 11/13/21: Good maintenance observed, family reports it continues to be the most challenging activity for patient; 12/22/21: Pt demonstrates good maintenance with stairs. She ascends/descends the stairs at home as often as she can without using her chair stairlift; 04/01/22: Pt demonstrates good maintenance with stairs. She ascends/descends the stairs at home as often as she can without using her chair stairlift, pt also performs stairs in the community; 06/21/22: Pt and family reporting increased difficulty and fatigue when performing stairs at home. Pt reports that she is having more DOE; 07/27/22: Pt and family reporting more difficulty and fatigue when performing stairs at home as well as worsening DOE   Time 12    Period Weeks    Status On-going    Target Date 12/10/2022      PT LONG TERM GOAL #4   Title Patient will complete a TUG test in no more than 13.5 seconds for independent mobility and decreased fall risk maintenance    Baseline 12/11/20: 13.4s; 03/02/21: 13.5s; 06/11/21: 11.4s; 08/20/21: 12.35s; 11/12/21: 12.2s; 12/21/21: 12.0s; 04/01/22: 11.3s; 06/21/22: 13.4s; 07/27/22: 13.3s   Time 12    Period Weeks    Status On-going    Target Date 12/10/2022      PT LONG TERM GOAL #5   Title Patient will increase fastest 10 meter walk test to >0.81 m/s as to improve gait speed for better community ambulation and to reduce fall risk.    Baseline 12/11/20: 14.6s = 0.68 m/s; 03/02/21: 14.3s = 0.70 m/s; 06/11/21: 13.2s = 0.76 m/s; 08/20/21: 13.5s= 0.74 m/s; 11/12/21: 14.4s = 0.69 m/s; 12/21/21: 14.1s = 0.71 m/s; 04/01/22: 13.7s = 0.73 m/s; 06/21/22: 14.1s = 0.71 m/s; 07/27/22: 14.0s =  0.71 m/s   Time 12    Period Weeks    Status  Partially Met    Target Date 12/10/2022              Plan     Clinical Impression Statement Pt demonstrates excellent motivation during session today. Continued to focus on both balance and LE strengthening. Balance exercises are the most challenging to reproduce at home without the verbal and tactile cues from therapist for proper technique. Updated outcome measures and goals during 07/27/22. Her TUG remained worse  since her therapy frequency was decreased. Most notably her BERG also continued to worsen and was 29/56 compared to 35/56 on 04/01/22. This was concerning for considerable decline in her balance and increase in her fall risk. Her 5TSTS remained relatively consistent and actually improved slightly compared to her prior update. Her 96mgait speed also remained consistent at 14.0s = 0.71 m/s. Patient's FOTO score declined slightly to 45 and her Two Minute Walk Test declined by 31.' Overall pt showed maintenance of some outcome measures and a decline in other measures. Since her frequency was decreased the most significant decline was in her balance. This was consistent with the patient's family reporting more difficulty with stairs at home with increased fatigue. Discharge attempted at the end of 2021 and pt demonstrated a drop in her function during her prolonged absence out of therapy despite good consistency with her HEP. Frequency was also decreased from every other week to once per month in an attempt to maintain as much as possible and unfortunately this resulted in an unacceptable decline in patient's function. Medicaid approved increase in frequency to 1x/wk until the end of the year to prevent further decline. Pt is regularly exercising at home with the assistance of family as needed/appropriate. She is unable to complete all necessary exercises safely at home either independently or with family assistance due to need from physical therapist to monitor and correct form and technique as well  as to provide safe guarding. At this time therapist recommends increasing frequency of therapy to 1x/week for the remainder of 2023 due to patient's decline in function since therapy was decreased due to pressure from Medicaid as well as recent COVID infection. Plan will be to return to therapy every other week in 2024 with focus on modifications to her HEP, family education, and maintenance of her function. Therapist will reassess patient's outcome measures at regular intervals to assess need for continued maintenance therapy frequency. Pt requires physical therapy intervention to maintain her functional status including LE strength, balance, and gait speed/quality and she has not achieved maximal benefit from therapy services. She will benefit from continued PT services to address these deficits and maintain her function at home and school.   Personal Factors and Comorbidities Comorbidity 1;Past/Current Experience;Time since onset of injury/illness/exacerbation;Transportation;Social Background    Comorbidities spina bifida    Examination-Activity Limitations Bend;Carry;Continence;Lift;Reach Overhead;Squat;Stairs;Stand;Transfers    Examination-Participation Restrictions Church;Community Activity;Driving;Laundry;Meal Prep;Volunteer;Yard Work    Stability/Clinical Decision Making Stable/Uncomplicated    Rehab Potential Good    Clinical Impairments Affecting Rehab Potential weakness and decreased standing balance    PT Frequency 1x/week   PT Duration 12 weeks    PT Treatment/Interventions ADLs/Self Care Home Management;Aquatic Therapy;Biofeedback; Moist Heat;Electrical Stimulation;DME Instruction;Gait training;Stair training;Functional mobility training;Neuromuscular re-education;Balance training;Therapeutic exercise;Therapeutic activities;Patient/family education;Orthotic Fit/Training;;Manual techniques;Passive range of motion;Energy conservation;Splinting;Taping;Visual/perceptual  remediation/compensation;Cryotherapy    PT Next Visit Plan review and modify HEP as appropriate, balance and strength exercises    PT Home Exercise Plan Seated, supine, and core program: [email or web address]    Consulted and Agree with Plan of Care Patient             JPhillips GroutPT, DPT, G[facility] Clodagh Odenthal, PT 09/17/2022, 11:23 AM

## 2022-09-23 ENCOUNTER — Ambulatory Visit: Payer: Medicaid Other

## 2022-09-23 DIAGNOSIS — M6281 Muscle weakness (generalized): Secondary | ICD-10-CM

## 2022-09-23 DIAGNOSIS — R2681 Unsteadiness on feet: Secondary | ICD-10-CM

## 2022-09-24 NOTE — Therapy (Signed)
OUTPATIENT PHYSICAL THERAPY TREATMENT NOTE  Patient Name: Michelle Randall MRN: [id number] DOB:08/25/1996, 26 y.o., female Today's Date: 09/24/2022  PCP: Wayland Denis, PA-C REFERRING PROVIDER: Wayland Denis, PA-C   PT End of Session - 09/23/22 1538     Visit Number 44    Number of Visits 9    Date for PT Re-Evaluation 12/10/22    Authorization Type eval: 5/91/63, recert: 8/46/65, 9/93/57, 08/20/21, 11/11/21, 02/04/22, 04/02/22, 06/21/22;    Authorization Time Period 08/09/22-10/31/22 12 PT visits, Medicaid  VL: 27 combined PT/OT/SLP per year  Auth: is required    Authorization - Visit Number 6    Authorization - Number of Visits 12    PT Start Time 0177    PT Stop Time 1615    PT Time Calculation (min) 41 min    Equipment Utilized During Treatment Gait belt;Other (comment)   Bilateral AFOs, lofstrand crutches   Activity Tolerance Patient tolerated treatment well    Behavior During Therapy WFL for tasks assessed/performed            Past Medical History:  Diagnosis Date   Neurogenic bladder    Neurogenic bowel    S/P VP shunt    Spina bifida [facility])    Past Surgical History:  Procedure Laterality Date   VENTRICULOPERITONEAL SHUNT     There are no problems to display for this patient.   REFERRING DIAG: R26.81 (ICD-10-CM) - Unsteadiness on feet  THERAPY DIAG: Muscle weakness (generalized)  Unsteadiness on feet  PERTINENT HISTORY: Patient is a pleasant 26 year old female who presents for continuation of care for generalized weakness/ coordination deficits secondary to diagnosis of spina bifida. PMH includes VP shunt, spina bifida, and neurogenic bowel/bladder. Patient was discharged from this clinic at the end of 2021 due to meeting therapy cap for insurance that year. Patient reports that she has continued performing her HEP but notes a decline when she stops therapy due to inability to safely perform all of the balance and strength exercises as at home. One of the  biggest challenge she is experiencing is stair navigation She reports having multiple near falls when negotiating stairs at home. Patient must ascend/descend stairs to reach bedroom upstairs increasing her risk for falls daily.   PRECAUTIONS: Fall risk OUTCOME MEASURES:   TEST 12/11/20 03/02/21 06/11/21 08/20/21 11/12/21 12/21/21 04/01/22 06/21/22 07/27/22 Interpretation  5 times sit<>stand 10.6s 9.6s 8.1s Not Tested 7.4s 8.2s 8.3s 8.6s 7.7s WNL  10 meter walk test 14.6s = 0.68 m/s 14.3s = 0.70 m/s 13.2s =  0.76 m/s 13.5s=0.74 m/s 14.4s = 0.69 m/s 14.1s = 0.71 m/s 13.7s = 0.73 m/s 14.1s = 0.71 m/s 14.0s =  0.71 m/s <1.0 m/s indicates increased risk for falls; limited community ambulator  Timed up and Go 13.4s 13.5s 11.4s 12.35s 12.2s 12.0s 11.3s 13.4s 13.3s WNL, use of lofstrand crutches, >14 sec indicates increased risk for falls  2 minute walk test _0  [employer] 264' with lofstrand crutches (post: HR: 150, 99%, BORG: 5/10) 233' with lofstrand crutches (post: HR: 145, 97%, BORG: 5/10) 1000 feet is community ambulator  BERG Balance Assessment 32/56 32/56 33/56   34/56 34/56 34/56 35/56 31/56 29/56 <36/56 (100% risk for falls), 37-45 (80% risk for falls); 46-51 (>50% risk for falls); 52-55 (lower risk <25% of falls)  FOTO 51 49 53 53 49 50 45 47 45 Goal is to reach 59        TODAY'S TREATMENT:      SUBJECTIVE:  Pt reports that she is doing well today. She is completing her HEP without issue. No significant changes since the last therapy session. All equipment is functioning well at home. No pain reported upon arrival. No specific questions currently.   PAIN:  Are you having pain? No   TREATMENT     Neuromuscular Re-education  NuStep L1-3 BLE only x 5 minutes for warm-up during interval history intake (2 minutes unbilled);  All balance exercises performed in // bars without UE support unless otherwise specified; Feet apart static balance eyes  open/closed while holding a cone balancing a ball on top x 60s each; Feet apart balance with horizontal and vertical head turns while holding a cone balancing a ball on top x 60s each; Feet apart dynamic reaching in multiple directions while holding a cone in opposite hand balancing a ball on top x multiple bouts with each UE; Side stepping in // bars with single UE support while holding a cone in opposite hand balancing a ball on top x multiple laps; Feet together static balance eyes open/closed while holding a cone balancing a ball on top x 60s each; Feet together balance with horizontal and vertical head turns while holding a cone balancing a ball on top x 60s each; Feet apart with balloon taps with second assist 2 x 60s;   Ther-ex  6" alternating step-ups at stairs with BUE support on handrails x 10 with each leg; Sit to stand from regular height chair without UE support 2 x 10; Reinforced continued performance of HEP;   Not performed: Split squats alternating forward LE x 10 each; Mini squats with BUE support x 10; Feet apart 1# dumbbell (DB) alternating shoulder forward flexion x 10 BUE; Feet apart 1# DB alternating shoulder abduction x 10 BUE; Forward/retro in // bars with single UE support holding a cone in the contralateral hand while balancing ball on top x multiple laps; Stepping over 1"x4" planks in parallel bars x multiple lengths with faded UE support progressing from BUE to single UE support; Ball reaches passes around body to therapist with BUE and return pass on opposite side with therapist varying height/distance x multiple bouts on each side; Feet apart cone stacking alternating UE and alternating ipsilateral vs contralateral reaching x multiple bouts with each UE; BOSU (round side up) step-ups alternating leading LE with BUE support x 10 on each side; Staggered stance static balance alternating forward LE x 60s each; Total Gym (TG) Level 22 (L22) double leg squats x  10; TG L22 single leg squats x 10 BLE; TG L22 double leg jumps x 10;   PATIENT EDUCATION: Education details: Pt educated throughout session about proper posture and technique with exercises. Improved exercise technique, movement at target joints, use of target muscles after min to mod verbal, visual, tactile cues. Reinforced importance of continued HEP Person educated: Patient Education method: Customer service manager Education comprehension: verbalized understanding and returned demonstration   HOME EXERCISE PROGRAM: Seated, supine, and core program: Medbridge Access Code: [email or web address]        PT Short Term Goals       PT SHORT TERM GOAL #1   Title Patient will report compliance with HEP for continued strengthening and stability during functional mobility.     Time 6    Period Weeks    Status Achieved              PT Long Term Goals      PT LONG TERM GOAL #1   Title  Patient will increase her FOTO score to at least 59 to demonstrate increased function and mobility for higher quality of life.    Baseline 12/11/20: 51; 03/02/21: 49; 06/11/21: 53; 08/20/21: 53; 11/12/21: 49; 12/21/21: 50/ 04/01/22: 45; 06/21/22: 47; 07/27/22: 45   Time 12    Period Weeks    Status Partially Met    Target Date 12/10/2022      PT LONG TERM GOAL #2   Title Patient will maintain Berg Balance score of at least 32/56 in order to demonstrate decreased fall risk during functional activities.    Baseline 12/11/20: 32/56; 03/02/21: 32/56; 06/11/21: 33/56; 08/20/2021: 34/56; 11/12/21: 34/56; 12/22/21: 34/56; 04/01/22: 35/56; 06/21/22: 31/56; 07/27/22: 29/56   Time 12    Period Weeks    Status On-going    Target Date 12/10/2022      PT LONG TERM GOAL #3   Title Patient will be able to maintain safe performance with ascending/descending a flight of stairs using lofstrand crutches and railing in order to allow for safe navigation of her home environment.    Baseline 12/11/20: currently able to use lofstrands and/or  railing with supervision by family; 03/02/21: Good maintenance observed; 06/11/21: Good maintenance observed; 08/20/2021: Good maintenance observed; 11/13/21: Good maintenance observed, family reports it continues to be the most challenging activity for patient; 12/22/21: Pt demonstrates good maintenance with stairs. She ascends/descends the stairs at home as often as she can without using her chair stairlift; 04/01/22: Pt demonstrates good maintenance with stairs. She ascends/descends the stairs at home as often as she can without using her chair stairlift, pt also performs stairs in the community; 06/21/22: Pt and family reporting increased difficulty and fatigue when performing stairs at home. Pt reports that she is having more DOE; 07/27/22: Pt and family reporting more difficulty and fatigue when performing stairs at home as well as worsening DOE   Time 12    Period Weeks    Status On-going    Target Date 12/10/2022      PT LONG TERM GOAL #4   Title Patient will complete a TUG test in no more than 13.5 seconds for independent mobility and decreased fall risk maintenance    Baseline 12/11/20: 13.4s; 03/02/21: 13.5s; 06/11/21: 11.4s; 08/20/21: 12.35s; 11/12/21: 12.2s; 12/21/21: 12.0s; 04/01/22: 11.3s; 06/21/22: 13.4s; 07/27/22: 13.3s   Time 12    Period Weeks    Status On-going    Target Date 12/10/2022      PT LONG TERM GOAL #5   Title Patient will increase fastest 10 meter walk test to >0.81 m/s as to improve gait speed for better community ambulation and to reduce fall risk.    Baseline 12/11/20: 14.6s = 0.68 m/s; 03/02/21: 14.3s = 0.70 m/s; 06/11/21: 13.2s = 0.76 m/s; 08/20/21: 13.5s= 0.74 m/s; 11/12/21: 14.4s = 0.69 m/s; 12/21/21: 14.1s = 0.71 m/s; 04/01/22: 13.7s = 0.73 m/s; 06/21/22: 14.1s = 0.71 m/s; 07/27/22: 14.0s =  0.71 m/s   Time 12    Period Weeks    Status Partially Met    Target Date 12/10/2022              Plan     Clinical Impression Statement Pt demonstrates excellent motivation during session  today. Continued to focus on both balance and LE strengthening. Incorporated dual motor tasks and dynamic reaching to simulate patient's challenge at home with transferring and carrying objects with single UE support from a lofstrand crutch. Balance exercises are the most challenging to reproduce at home without the  verbal and tactile cues from therapist for proper technique. Discharge attempted at the end of 2021 and pt demonstrated a drop in her function during her prolonged absence out of therapy despite good consistency with her HEP. Frequency was also decreased from every other week to once per month in an attempt to maintain as much as possible and unfortunately this resulted in an unacceptable decline in patient's function. Medicaid approved increase in frequency to 1x/wk until the end of the year to prevent further decline. Pt is regularly exercising at home with the assistance of family as needed/appropriate. She is unable to complete all necessary exercises safely at home either independently or with family assistance due to need from physical therapist to monitor and correct form and technique as well as to provide safe guarding. At this time therapist recommends continuing frequency of therapy to 1x/week for the remainder of 2023 due to patient's decline in function since therapy was decreased due to pressure from Medicaid as well as recent COVID infection. Plan will be to return to therapy every other week in 2024 with focus on modifications to her HEP, family education, and maintenance of her function. Therapist will reassess patient's outcome measures at regular intervals to assess need for continued maintenance therapy frequency. Pt requires physical therapy intervention to maintain her functional status including LE strength, balance, and gait speed/quality and she has not achieved maximal benefit from therapy services. She will benefit from continued PT services to address these deficits and  maintain her function at home and school.   Personal Factors and Comorbidities Comorbidity 1;Past/Current Experience;Time since onset of injury/illness/exacerbation;Transportation;Social Background    Comorbidities spina bifida    Examination-Activity Limitations Bend;Carry;Continence;Lift;Reach Overhead;Squat;Stairs;Stand;Transfers    Examination-Participation Restrictions Church;Community Activity;Driving;Laundry;Meal Prep;Volunteer;Yard Work    Stability/Clinical Decision Making Stable/Uncomplicated    Rehab Potential Good    Clinical Impairments Affecting Rehab Potential weakness and decreased standing balance    PT Frequency 1x/week   PT Duration 12 weeks    PT Treatment/Interventions ADLs/Self Care Home Management;Aquatic Therapy;Biofeedback; Moist Heat;Electrical Stimulation;DME Instruction;Gait training;Stair training;Functional mobility training;Neuromuscular re-education;Balance training;Therapeutic exercise;Therapeutic activities;Patient/family education;Orthotic Fit/Training;;Manual techniques;Passive range of motion;Energy conservation;Splinting;Taping;Visual/perceptual remediation/compensation;Cryotherapy    PT Next Visit Plan review and modify HEP as appropriate, balance and strength exercises    PT Home Exercise Plan Seated, supine, and core program: [email or web address]    Consulted and Agree with Plan of Care Patient             Phillips Grout PT, DPT, GCS Parilee Hally, PT 09/24/2022, 12:46 PM

## 2022-09-30 ENCOUNTER — Ambulatory Visit: Payer: Medicaid Other

## 2022-09-30 DIAGNOSIS — R2681 Unsteadiness on feet: Secondary | ICD-10-CM

## 2022-09-30 DIAGNOSIS — M6281 Muscle weakness (generalized): Secondary | ICD-10-CM | POA: Diagnosis not present

## 2022-09-30 NOTE — Therapy (Signed)
OUTPATIENT PHYSICAL THERAPY TREATMENT NOTE  Patient Name: Michelle Randall MRN: [id number] DOB:10-30-1996, 26 y.o., female Today's Date: 10/01/2022  PCP: Wayland Denis, PA-C REFERRING PROVIDER: Wayland Denis, PA-C   PT End of Session - 09/30/22 1534     Visit Number 45    Number of Visits 34    Date for PT Re-Evaluation 12/10/22    Authorization Type eval: 6/50/35, recert: 4/65/68, 12/11/49, 08/20/21, 11/11/21, 02/04/22, 04/02/22, 06/21/22;    Authorization Time Period 08/09/22-10/31/22 12 PT visits, Medicaid  VL: 27 combined PT/OT/SLP per year  Auth: is required    Authorization - Visit Number 7    Authorization - Number of Visits 12    PT Start Time 7001    PT Stop Time 1615    PT Time Calculation (min) 45 min    Equipment Utilized During Treatment Gait belt;Other (comment)   Bilateral AFOs, lofstrand crutches   Activity Tolerance Patient tolerated treatment well    Behavior During Therapy WFL for tasks assessed/performed            Past Medical History:  Diagnosis Date   Neurogenic bladder    Neurogenic bowel    S/P VP shunt    Spina bifida [facility])    Past Surgical History:  Procedure Laterality Date   VENTRICULOPERITONEAL SHUNT     There are no problems to display for this patient.   REFERRING DIAG: R26.81 (ICD-10-CM) - Unsteadiness on feet  THERAPY DIAG: Muscle weakness (generalized)  Unsteadiness on feet  PERTINENT HISTORY: Patient is a pleasant 26 year old female who presents for continuation of care for generalized weakness/ coordination deficits secondary to diagnosis of spina bifida. PMH includes VP shunt, spina bifida, and neurogenic bowel/bladder. Patient was discharged from this clinic at the end of 2021 due to meeting therapy cap for insurance that year. Patient reports that she has continued performing her HEP but notes a decline when she stops therapy due to inability to safely perform all of the balance and strength exercises as at home. One of the  biggest challenge she is experiencing is stair navigation She reports having multiple near falls when negotiating stairs at home. Patient must ascend/descend stairs to reach bedroom upstairs increasing her risk for falls daily.   PRECAUTIONS: Fall risk OUTCOME MEASURES:   TEST 12/11/20 03/02/21 06/11/21 08/20/21 11/12/21 12/21/21 04/01/22 06/21/22 07/27/22 Interpretation  5 times sit<>stand 10.6s 9.6s 8.1s Not Tested 7.4s 8.2s 8.3s 8.6s 7.7s WNL  10 meter walk test 14.6s = 0.68 m/s 14.3s = 0.70 m/s 13.2s =  0.76 m/s 13.5s=0.74 m/s 14.4s = 0.69 m/s 14.1s = 0.71 m/s 13.7s = 0.73 m/s 14.1s = 0.71 m/s 14.0s =  0.71 m/s <1.0 m/s indicates increased risk for falls; limited community ambulator  Timed up and Go 13.4s 13.5s 11.4s 12.35s 12.2s 12.0s 11.3s 13.4s 13.3s WNL, use of lofstrand crutches, >14 sec indicates increased risk for falls  2 minute walk test _0  [employer] 264' with lofstrand crutches (post: HR: 150, 99%, BORG: 5/10) 233' with lofstrand crutches (post: HR: 145, 97%, BORG: 5/10) 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 She is completing her HEP without issue. No significant changes since the last therapy session. All equipment is functioning well at home. No pain reported upon arrival. No specific questions currently.   PAIN:  Are you having pain? No   TREATMENT     Neuromuscular Re-education  NuStep L1-3 BLE only x 5 minutes for warm-up during interval history intake (2 minutes unbilled);  All balance exercises performed in // bars without UE support unless otherwise specified; Forward/retro in // bars with single  UE support holding a cone in the contralateral hand while balancing ball on top x multiple laps; Side stepping in // bars with single UE support holding a cone in the contralateral hand while balancing ball on top x multiple laps; Feet apart static balance eyes open/closed while holding a cone balancing a ball on top x 60s each; Feet apart balance with horizontal and vertical head turns x 60s each; Feet apart with balloon taps with second assist 2 x 60s; Airex feet apart static balance eyes open/closed while holding a cone balancing a ball on top x 60s each; Airex feet apart balance with horizontal and vertical head turns x 60s each; Airex staggered balance with front foot on 6" step alternating forward LE x 60s each;   Ther-ex  Total Gym (TG) Level 22 (L22) double leg squats x 10; TG L22 single leg squats x 10 BLE; TG L22 double leg jumps x 10; Mini squats with BUE support x 10; Sit to stand from regular height chair without UE support 2 x 10; Reinforced continued performance of HEP;   Not performed: Feet apart 1# dumbbell (DB) alternating shoulder forward flexion x 10 BUE; Feet apart 1# DB alternating shoulder abduction x 10 BUE; Stepping over 1"x4" planks in parallel bars x multiple lengths with faded UE support progressing from BUE to single UE support; Ball reaches passes around body to therapist with BUE and return pass on opposite side with therapist varying height/distance x multiple bouts on each side; Feet apart cone stacking alternating UE and alternating ipsilateral vs contralateral reaching x multiple bouts with each UE; BOSU (round side up) step-ups alternating leading LE with BUE support x 10 on each side; Staggered stance static balance alternating forward LE x 60s each; Split squats alternating forward LE x 10 each;   PATIENT EDUCATION: Education details: Pt educated throughout session about proper posture and technique with exercises. Improved exercise technique,  movement at target joints, use of target muscles after min to mod verbal, visual, tactile cues. Reinforced importance of continued HEP Person educated: Patient Education method: Customer service manager Education comprehension: verbalized understanding and returned demonstration   HOME EXERCISE PROGRAM: Seated, supine, and core program: Medbridge Access Code: [email or web address]        PT Short Term Goals       PT SHORT TERM GOAL #1   Title Patient will report compliance with HEP for continued strengthening and stability during functional mobility.     Time 6    Period Weeks    Status Achieved              PT Long Term Goals      PT LONG TERM GOAL #1   Title Patient will increase her FOTO score to at least 59 to demonstrate increased function and mobility for higher quality of life.    Baseline 12/11/20: 51; 03/02/21: 49; 06/11/21: 53; 08/20/21: 53; 11/12/21: 49; 12/21/21: 50/ 04/01/22: 45; 06/21/22: 47; 07/27/22:  45   Time 12    Period Weeks    Status Partially Met    Target Date 12/10/2022      PT LONG TERM GOAL #2   Title Patient will maintain Berg Balance score of at least 32/56 in order to demonstrate decreased fall risk during functional activities.    Baseline 12/11/20: 32/56; 03/02/21: 32/56; 06/11/21: 33/56; 08/20/2021: 34/56; 11/12/21: 34/56; 12/22/21: 34/56; 04/01/22: 35/56; 06/21/22: 31/56; 07/27/22: 29/56   Time 12    Period Weeks    Status On-going    Target Date 12/10/2022      PT LONG TERM GOAL #3   Title Patient will be able to maintain safe performance with ascending/descending a flight of stairs using lofstrand crutches and railing in order to allow for safe navigation of her home environment.    Baseline 12/11/20: currently able to use lofstrands and/or railing with supervision by family; 03/02/21: Good maintenance observed; 06/11/21: Good maintenance observed; 08/20/2021: Good maintenance observed; 11/13/21: Good maintenance observed, family reports it continues to be the most  challenging activity for patient; 12/22/21: Pt demonstrates good maintenance with stairs. She ascends/descends the stairs at home as often as she can without using her chair stairlift; 04/01/22: Pt demonstrates good maintenance with stairs. She ascends/descends the stairs at home as often as she can without using her chair stairlift, pt also performs stairs in the community; 06/21/22: Pt and family reporting increased difficulty and fatigue when performing stairs at home. Pt reports that she is having more DOE; 07/27/22: Pt and family reporting more difficulty and fatigue when performing stairs at home as well as worsening DOE   Time 12    Period Weeks    Status On-going    Target Date 12/10/2022      PT LONG TERM GOAL #4   Title Patient will complete a TUG test in no more than 13.5 seconds for independent mobility and decreased fall risk maintenance    Baseline 12/11/20: 13.4s; 03/02/21: 13.5s; 06/11/21: 11.4s; 08/20/21: 12.35s; 11/12/21: 12.2s; 12/21/21: 12.0s; 04/01/22: 11.3s; 06/21/22: 13.4s; 07/27/22: 13.3s   Time 12    Period Weeks    Status On-going    Target Date 12/10/2022      PT LONG TERM GOAL #5   Title Patient will increase fastest 10 meter walk test to >0.81 m/s as to improve gait speed for better community ambulation and to reduce fall risk.    Baseline 12/11/20: 14.6s = 0.68 m/s; 03/02/21: 14.3s = 0.70 m/s; 06/11/21: 13.2s = 0.76 m/s; 08/20/21: 13.5s= 0.74 m/s; 11/12/21: 14.4s = 0.69 m/s; 12/21/21: 14.1s = 0.71 m/s; 04/01/22: 13.7s = 0.73 m/s; 06/21/22: 14.1s = 0.71 m/s; 07/27/22: 14.0s =  0.71 m/s   Time 12    Period Weeks    Status Partially Met    Target Date 12/10/2022              Plan     Clinical Impression Statement Pt demonstrates excellent motivation during session today. Continued to focus on both balance and LE strengthening. Performed power exercises on the Total Gym again today. Incorporated dual motor tasks again today to simulate patient's challenge at home with transferring and  carrying objects with single UE support from a lofstrand crutch. Balance exercises are the most challenging to reproduce at home without the verbal and tactile cues from therapist for proper technique. Discharge attempted at the end of 2021 and pt demonstrated a drop in her function during her prolonged absence out of therapy despite good consistency  with her HEP. Frequency was also decreased from every other week to once per month in an attempt to maintain as much as possible and unfortunately this resulted in an unacceptable decline in patient's function. Medicaid approved increase in frequency to 1x/wk until the end of the year to prevent further decline. Pt is regularly exercising at home with the assistance of family as needed/appropriate. She is unable to complete all necessary exercises safely at home either independently or with family assistance due to need from physical therapist to monitor and correct form and technique as well as to provide safe guarding. At this time therapist recommends continuing frequency of therapy to 1x/week for the remainder of 2023 due to patient's decline in function since therapy was decreased due to pressure from Medicaid as well as recent COVID infection. Plan will be to return to therapy every other week in 2024 with focus on modifications to her HEP, family education, and maintenance of her function. Therapist will reassess patient's outcome measures at regular intervals to assess need for continued maintenance therapy frequency. Pt requires physical therapy intervention to maintain her functional status including LE strength, balance, and gait speed/quality and she has not achieved maximal benefit from therapy services. She will benefit from continued PT services to address these deficits and maintain her function at home and school.   Personal Factors and Comorbidities Comorbidity 1;Past/Current Experience;Time since onset of  injury/illness/exacerbation;Transportation;Social Background    Comorbidities spina bifida    Examination-Activity Limitations Bend;Carry;Continence;Lift;Reach Overhead;Squat;Stairs;Stand;Transfers    Examination-Participation Restrictions Church;Community Activity;Driving;Laundry;Meal Prep;Volunteer;Yard Work    Stability/Clinical Decision Making Stable/Uncomplicated    Rehab Potential Good    Clinical Impairments Affecting Rehab Potential weakness and decreased standing balance    PT Frequency 1x/week   PT Duration 12 weeks    PT Treatment/Interventions ADLs/Self Care Home Management;Aquatic Therapy;Biofeedback; Moist Heat;Electrical Stimulation;DME Instruction;Gait training;Stair training;Functional mobility training;Neuromuscular re-education;Balance training;Therapeutic exercise;Therapeutic activities;Patient/family education;Orthotic Fit/Training;;Manual techniques;Passive range of motion;Energy conservation;Splinting;Taping;Visual/perceptual remediation/compensation;Cryotherapy    PT Next Visit Plan review and modify HEP as appropriate, balance and strength exercises    PT Home Exercise Plan Seated, supine, and core program: [email or web address]    Consulted and Agree with Plan of Care Patient             Lyndel Safe Aritza Brunet PT, DPT, [facility] Tiphani Mells, PT 10/01/2022, 10:02 AM

## 2022-10-03 NOTE — Therapy (Signed)
OUTPATIENT PHYSICAL THERAPY TREATMENT NOTE  Patient Name: Michelle Randall MRN: [id number] DOB:19-Jul-1996, 26 y.o., female Today's Date: 10/05/2022  PCP: Wayland Denis, PA-C REFERRING PROVIDER: Wayland Denis, PA-C   PT End of Session - 10/04/22 1418     Visit Number 46    Number of Visits 26    Date for PT Re-Evaluation 12/10/22    Authorization Type eval: 4/69/62, recert: 9/52/84, 1/32/44, 08/20/21, 11/11/21, 02/04/22, 04/02/22, 06/21/22;    Authorization Time Period 08/09/22-10/31/22 12 PT visits, Medicaid  VL: 27 combined PT/OT/SLP per year  Auth: is required    Authorization - Visit Number 8    Authorization - Number of Visits 12    PT Start Time 0102    PT Stop Time 1445    PT Time Calculation (min) 40 min    Equipment Utilized During Treatment Gait belt;Other (comment)   Bilateral AFOs, lofstrand crutches   Activity Tolerance Patient tolerated treatment well    Behavior During Therapy WFL for tasks assessed/performed            Past Medical History:  Diagnosis Date   Neurogenic bladder    Neurogenic bowel    S/P VP shunt    Spina bifida [facility])    Past Surgical History:  Procedure Laterality Date   VENTRICULOPERITONEAL SHUNT     There are no problems to display for this patient.   REFERRING DIAG: R26.81 (ICD-10-CM) - Unsteadiness on feet  THERAPY DIAG: Muscle weakness (generalized)  Unsteadiness on feet  PERTINENT HISTORY: Patient is a pleasant 26 year old female who presents for continuation of care for generalized weakness/ coordination deficits secondary to diagnosis of spina bifida. PMH includes VP shunt, spina bifida, and neurogenic bowel/bladder. Patient was discharged from this clinic at the end of 2021 due to meeting therapy cap for insurance that year. Patient reports that she has continued performing her HEP but notes a decline when she stops therapy due to inability to safely perform all of the balance and strength exercises as at home. One of the  biggest challenge she is experiencing is stair navigation She reports having multiple near falls when negotiating stairs at home. Patient must ascend/descend stairs to reach bedroom upstairs increasing her risk for falls daily.   PRECAUTIONS: Fall risk OUTCOME MEASURES:   TEST 12/11/20 03/02/21 06/11/21 08/20/21 11/12/21 12/21/21 04/01/22 06/21/22 07/27/22 Interpretation  5 times sit<>stand 10.6s 9.6s 8.1s Not Tested 7.4s 8.2s 8.3s 8.6s 7.7s WNL  10 meter walk test 14.6s = 0.68 m/s 14.3s = 0.70 m/s 13.2s =  0.76 m/s 13.5s=0.74 m/s 14.4s = 0.69 m/s 14.1s = 0.71 m/s 13.7s = 0.73 m/s 14.1s = 0.71 m/s 14.0s =  0.71 m/s <1.0 m/s indicates increased risk for falls; limited community ambulator  Timed up and Go 13.4s 13.5s 11.4s 12.35s 12.2s 12.0s 11.3s 13.4s 13.3s WNL, use of lofstrand crutches, >14 sec indicates increased risk for falls  2 minute walk test _0  [employer] 264' with lofstrand crutches (post: HR: 150, 99%, BORG: 5/10) 233' with lofstrand crutches (post: HR: 145, 97%, BORG: 5/10) 1000 feet is community ambulator  BERG Balance Assessment 32/56 32/56 33/56   34/56 34/56 34/56 35/56 31/56 29/56 <36/56 (100% risk for falls), 37-45 (80% risk for falls); 46-51 (>50% risk for falls); 52-55 (lower risk <25% of falls)  FOTO 51 49 53 53 49 50 45 47 45 Goal is to reach 59       TODAY'S TREATMENT:      SUBJECTIVE: Pt  reports that she is doing well today. She is completing her HEP without issue. No significant changes since the last therapy session. All equipment is functioning well at home. No pain reported upon arrival. No specific questions currently.   PAIN:  Are you having pain? No   TREATMENT     Neuromuscular Re-education  NuStep L1-3 BLE only x 5 minutes for warm-up during interval history intake (2 minutes unbilled);  All balance exercises performed in // bars without UE support unless otherwise specified; Feet together (FT) ball passes around  body with therapist adjusting height and distance from patient x multiple bouts on each side; FT dynamic reaching hand taps with therapist adjusting height and distance from patient as well as challenging pt to cross midline x multiple bouts on each side; Semitandem balance x 45s with each foot forward; Obstacle course in // bars with bilateral Lofstrand crutches stepping over 1"x4" boards" Feet apart basketball tosses with pt picking up the ball from the floor and shooting x multiple bouts; Airex feet apart basketball tosses with pt picking up the ball from the floor and shooting x multiple bouts; Reinforced continued performance of HEP;   Not performed: Feet apart 1# dumbbell (DB) alternating shoulder forward flexion x 10 BUE; Feet apart 1# DB alternating shoulder abduction x 10 BUE; Feet apart cone stacking alternating UE and alternating ipsilateral vs contralateral reaching x multiple bouts with each UE; BOSU (round side up) step-ups alternating leading LE with BUE support x 10 on each side; Staggered stance static balance alternating forward LE x 60s each; Split squats alternating forward LE x 10 each; Forward/retro in // bars with single Lofstrand crutch (LUE) holding a cone in the contralateral hand while balancing ball on top x multiple laps; Side stepping in // bars with single UE support holding a cone in the contralateral hand while balancing ball on top x multiple laps; Feet apart static balance eyes open/closed while holding a cone balancing a ball on top x 60s each; Feet apart balance with horizontal and vertical head turns x 60s each; Feet apart with balloon taps with second assist 2 x 60s; Airex feet apart static balance eyes open/closed while holding a cone balancing a ball on top x 60s each; Airex feet apart balance with horizontal and vertical head turns x 60s each; Airex staggered balance with front foot on 6" step alternating forward LE x 60s each; Total Gym (TG) Level 22  (L22) double leg squats x 10; TG L22 single leg squats x 10 BLE; TG L22 double leg jumps x 10; Mini squats with BUE support x 10; Sit to stand from regular height chair without UE support 2 x 10;     PATIENT EDUCATION: Education details: Pt educated throughout session about proper posture and technique with exercises. Improved exercise technique, movement at target joints, use of target muscles after min to mod verbal, visual, tactile cues. Reinforced importance of continued HEP Person educated: Patient Education method: Customer service manager Education comprehension: verbalized understanding and returned demonstration   HOME EXERCISE PROGRAM: Seated, supine, and core program: Medbridge Access Code: [email or web address]        PT Short Term Goals       PT SHORT TERM GOAL #1   Title Patient will report compliance with HEP for continued strengthening and stability during functional mobility.     Time 6    Period Weeks    Status Achieved  PT Long Term Goals      PT LONG TERM GOAL #1   Title Patient will increase her FOTO score to at least 59 to demonstrate increased function and mobility for higher quality of life.    Baseline 12/11/20: 51; 03/02/21: 49; 06/11/21: 53; 08/20/21: 53; 11/12/21: 49; 12/21/21: 50/ 04/01/22: 45; 06/21/22: 47; 07/27/22: 45   Time 12    Period Weeks    Status Partially Met    Target Date 12/10/2022      PT LONG TERM GOAL #2   Title Patient will maintain Berg Balance score of at least 32/56 in order to demonstrate decreased fall risk during functional activities.    Baseline 12/11/20: 32/56; 03/02/21: 32/56; 06/11/21: 33/56; 08/20/2021: 34/56; 11/12/21: 34/56; 12/22/21: 34/56; 04/01/22: 35/56; 06/21/22: 31/56; 07/27/22: 29/56   Time 12    Period Weeks    Status On-going    Target Date 12/10/2022      PT LONG TERM GOAL #3   Title Patient will be able to maintain safe performance with ascending/descending a flight of stairs using lofstrand crutches and  railing in order to allow for safe navigation of her home environment.    Baseline 12/11/20: currently able to use lofstrands and/or railing with supervision by family; 03/02/21: Good maintenance observed; 06/11/21: Good maintenance observed; 08/20/2021: Good maintenance observed; 11/13/21: Good maintenance observed, family reports it continues to be the most challenging activity for patient; 12/22/21: Pt demonstrates good maintenance with stairs. She ascends/descends the stairs at home as often as she can without using her chair stairlift; 04/01/22: Pt demonstrates good maintenance with stairs. She ascends/descends the stairs at home as often as she can without using her chair stairlift, pt also performs stairs in the community; 06/21/22: Pt and family reporting increased difficulty and fatigue when performing stairs at home. Pt reports that she is having more DOE; 07/27/22: Pt and family reporting more difficulty and fatigue when performing stairs at home as well as worsening DOE   Time 12    Period Weeks    Status On-going    Target Date 12/10/2022      PT LONG TERM GOAL #4   Title Patient will complete a TUG test in no more than 13.5 seconds for independent mobility and decreased fall risk maintenance    Baseline 12/11/20: 13.4s; 03/02/21: 13.5s; 06/11/21: 11.4s; 08/20/21: 12.35s; 11/12/21: 12.2s; 12/21/21: 12.0s; 04/01/22: 11.3s; 06/21/22: 13.4s; 07/27/22: 13.3s   Time 12    Period Weeks    Status On-going    Target Date 12/10/2022      PT LONG TERM GOAL #5   Title Patient will increase fastest 10 meter walk test to >0.81 m/s as to improve gait speed for better community ambulation and to reduce fall risk.    Baseline 12/11/20: 14.6s = 0.68 m/s; 03/02/21: 14.3s = 0.70 m/s; 06/11/21: 13.2s = 0.76 m/s; 08/20/21: 13.5s= 0.74 m/s; 11/12/21: 14.4s = 0.69 m/s; 12/21/21: 14.1s = 0.71 m/s; 04/01/22: 13.7s = 0.73 m/s; 06/21/22: 14.1s = 0.71 m/s; 07/27/22: 14.0s =  0.71 m/s   Time 12    Period Weeks    Status Partially Met     Target Date 12/10/2022              Plan     Clinical Impression Statement Pt demonstrates excellent motivation during session today. Focused exclusively on balance exercises during session today. Continued to incorporate dual motor tasks to simulate patient's challenge at home with transferring and carrying objects with single UE support from  a lofstrand crutch. Balance exercises are the most challenging to reproduce at home without the verbal and tactile cues from therapist for proper technique. Utilized a variety of dynamic challenges today and included picking up and shooting a basketball as it requires a different challenge than most of the balance exercises she has completed in the past. Discharge attempted at the end of 2021 and pt demonstrated a drop in her function during her prolonged absence out of therapy despite good consistency with her HEP. Frequency was also decreased from every other week to once per month in an attempt to maintain as much as possible and unfortunately this resulted in an unacceptable decline in patient's function. Medicaid approved increase in frequency to 1x/wk until the end of the year to prevent further decline. Pt is regularly exercising at home with the assistance of family as needed/appropriate. She is unable to complete all necessary exercises safely at home either independently or with family assistance due to need from physical therapist to monitor and correct form and technique as well as to provide safe guarding. At this time therapist recommends continuing frequency of therapy to 1x/week for the remainder of 2023 due to patient's decline in function since therapy was decreased due to pressure from Medicaid as well as recent COVID infection. Plan will be to return to therapy every other week in 2024 with focus on modifications to her HEP, family education, and maintenance of her function. Therapist will reassess patient's outcome measures at regular intervals to  assess need for continued maintenance therapy frequency. Pt requires physical therapy intervention to maintain her functional status including LE strength, balance, and gait speed/quality and she has not achieved maximal benefit from therapy services. She will benefit from continued PT services to address these deficits and maintain her function at home and school.   Personal Factors and Comorbidities Comorbidity 1;Past/Current Experience;Time since onset of injury/illness/exacerbation;Transportation;Social Background    Comorbidities spina bifida    Examination-Activity Limitations Bend;Carry;Continence;Lift;Reach Overhead;Squat;Stairs;Stand;Transfers    Examination-Participation Restrictions Church;Community Activity;Driving;Laundry;Meal Prep;Volunteer;Yard Work    Stability/Clinical Decision Making Stable/Uncomplicated    Rehab Potential Good    Clinical Impairments Affecting Rehab Potential weakness and decreased standing balance    PT Frequency 1x/week   PT Duration 12 weeks    PT Treatment/Interventions ADLs/Self Care Home Management;Aquatic Therapy;Biofeedback; Moist Heat;Electrical Stimulation;DME Instruction;Gait training;Stair training;Functional mobility training;Neuromuscular re-education;Balance training;Therapeutic exercise;Therapeutic activities;Patient/family education;Orthotic Fit/Training;;Manual techniques;Passive range of motion;Energy conservation;Splinting;Taping;Visual/perceptual remediation/compensation;Cryotherapy    PT Next Visit Plan review and modify HEP as appropriate, balance and strength exercises    PT Home Exercise Plan Seated, supine, and core program: [email or web address]    Consulted and Agree with Plan of Care Patient             Phillips Grout PT, DPT, GCS Huprich,Jason, PT 10/05/2022, 8:37 AM

## 2022-10-04 ENCOUNTER — Ambulatory Visit: Payer: Medicaid Other

## 2022-10-04 DIAGNOSIS — R2681 Unsteadiness on feet: Secondary | ICD-10-CM

## 2022-10-04 DIAGNOSIS — M6281 Muscle weakness (generalized): Secondary | ICD-10-CM | POA: Diagnosis not present

## 2022-10-14 ENCOUNTER — Ambulatory Visit: Payer: Medicaid Other

## 2022-10-14 DIAGNOSIS — M6281 Muscle weakness (generalized): Secondary | ICD-10-CM | POA: Diagnosis not present

## 2022-10-14 DIAGNOSIS — R2681 Unsteadiness on feet: Secondary | ICD-10-CM

## 2022-10-14 NOTE — Therapy (Signed)
OUTPATIENT PHYSICAL THERAPY TREATMENT NOTE  Patient Name: Michelle Randall MRN: [id number] DOB:04-19-1996, 26 y.o., female Today's Date: 10/14/2022  PCP: Wayland Denis, PA-C REFERRING PROVIDER: Wayland Denis, PA-C   PT End of Session - 10/14/22 1414     Visit Number 47    Number of Visits 40    Date for PT Re-Evaluation 12/10/22    Authorization Type eval: 4/56/25, recert: 6/38/93, 7/34/28, 08/20/21, 11/11/21, 02/04/22, 04/02/22, 06/21/22;    Authorization Time Period 08/09/22-10/31/22 12 PT visits, Medicaid  VL: 27 combined PT/OT/SLP per year  Auth: is required    Authorization - Visit Number 9    Authorization - Number of Visits 12    PT Start Time 7681    PT Stop Time 1572    PT Time Calculation (min) 42 min    Equipment Utilized During Treatment Gait belt;Other (comment)   Bilateral AFOs, lofstrand crutches   Activity Tolerance Patient tolerated treatment well    Behavior During Therapy WFL for tasks assessed/performed            Past Medical History:  Diagnosis Date   Neurogenic bladder    Neurogenic bowel    S/P VP shunt    Spina bifida [facility])    Past Surgical History:  Procedure Laterality Date   VENTRICULOPERITONEAL SHUNT     There are no problems to display for this patient.   REFERRING DIAG: R26.81 (ICD-10-CM) - Unsteadiness on feet  THERAPY DIAG: Muscle weakness (generalized)  Unsteadiness on feet  PERTINENT HISTORY: Patient is a pleasant 26 year old female who presents for continuation of care for generalized weakness/ coordination deficits secondary to diagnosis of spina bifida. PMH includes VP shunt, spina bifida, and neurogenic bowel/bladder. Patient was discharged from this clinic at the end of 2021 due to meeting therapy cap for insurance that year. Patient reports that she has continued performing her HEP but notes a decline when she stops therapy due to inability to safely perform all of the balance and strength exercises as at home. One of the  biggest challenge she is experiencing is stair navigation She reports having multiple near falls when negotiating stairs at home. Patient must ascend/descend stairs to reach bedroom upstairs increasing her risk for falls daily.   PRECAUTIONS: Fall risk OUTCOME MEASURES:   TEST 12/11/20 03/02/21 06/11/21 08/20/21 11/12/21 12/21/21 04/01/22 06/21/22 07/27/22 Interpretation  5 times sit<>stand 10.6s 9.6s 8.1s Not Tested 7.4s 8.2s 8.3s 8.6s 7.7s WNL  10 meter walk test 14.6s = 0.68 m/s 14.3s = 0.70 m/s 13.2s =  0.76 m/s 13.5s=0.74 m/s 14.4s = 0.69 m/s 14.1s = 0.71 m/s 13.7s = 0.73 m/s 14.1s = 0.71 m/s 14.0s =  0.71 m/s <1.0 m/s indicates increased risk for falls; limited community ambulator  Timed up and Go 13.4s 13.5s 11.4s 12.35s 12.2s 12.0s 11.3s 13.4s 13.3s WNL, use of lofstrand crutches, >14 sec indicates increased risk for falls  2 minute walk test _0  [employer] 264' with lofstrand crutches (post: HR: 150, 99%, BORG: 5/10) 233' with lofstrand crutches (post: HR: 145, 97%, BORG: 5/10) 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 She is completing her HEP without issue. No significant changes since the last therapy session. All equipment is functioning well at home. No pain reported upon arrival. No specific questions currently.   PAIN:  Are you having pain? No   TREATMENT     Neuromuscular Re-education  NuStep L1-3 BLE only x 5 minutes for warm-up during interval history intake (2 minutes unbilled);  All balance exercises performed in // bars without UE support unless otherwise specified; 6" step-ups alternating leading LE  with BUE support x 10 on each side; Obstacle course in // bars with faded UE support progressing from bilateral to unilateral support, stepping over 1"x4" boards x multiple laps; Feet apart basketball tosses with pt picking up the ball from the floor and shooting x multiple bouts; Airex feet apart basketball tosses with pt picking up the ball from the floor and shooting x multiple bouts; Airex feet together basketball tosses with pt picking up the ball from the floor and shooting x multiple bouts; Forward/retro in // bars with single UE support and pt holding a cone in the contralateral hand while balancing ball on top x multiple laps; Side stepping in // bars with single UE support and pt holding a cone in the contralateral hand while balancing ball on top x multiple laps;    Not performed: Feet apart 1# dumbbell (DB) alternating shoulder forward flexion x 10 BUE; Feet apart 1# DB alternating shoulder abduction x 10 BUE; Feet apart cone stacking alternating UE and alternating ipsilateral vs contralateral reaching x multiple bouts with each UE; BOSU (round side up) step-ups alternating leading LE with BUE support x 10 on each side; Staggered stance static balance alternating forward LE x 60s each; Split squats alternating forward LE x 10 each; Feet apart static balance eyes open/closed while holding a cone balancing a ball on top x 60s each; Feet apart balance with horizontal and vertical head turns x 60s each; Feet apart with balloon taps with second assist 2 x 60s; Airex feet apart static balance eyes open/closed while holding a cone balancing a ball on top x 60s each; Airex feet apart balance with horizontal and vertical head turns x 60s each; Airex staggered balance with front foot on 6" step alternating forward LE x 60s each; Total Gym (TG) Level 22 (L22) double leg squats x 10; TG L22 single leg squats x 10 BLE; TG L22 double leg jumps x 10; Mini squats with BUE support x 10; Sit to  stand from regular height chair without UE support 2 x 10; Feet together (FT) ball passes around body with therapist adjusting height and distance from patient x multiple bouts on each side; FT dynamic reaching hand taps with therapist adjusting height and distance from patient as well as challenging pt to cross midline x multiple bouts on each side; Semitandem balance x 45s with each foot forward;     PATIENT EDUCATION: Education details: Pt educated throughout session about proper posture and technique with exercises. Improved exercise technique, movement at target joints, use of target muscles after min to mod verbal, visual, tactile cues. Reinforced importance of continued HEP Person educated: Patient Education method: Explanation and Demonstration Education comprehension: verbalized understanding and returned demonstration   HOME EXERCISE PROGRAM: Seated, supine, and core program: Medbridge Access Code: [email or web address]        PT Short Term Goals       PT SHORT TERM GOAL #1   Title Patient will report  compliance with HEP for continued strengthening and stability during functional mobility.     Time 6    Period Weeks    Status Achieved              PT Long Term Goals      PT LONG TERM GOAL #1   Title Patient will increase her FOTO score to at least 59 to demonstrate increased function and mobility for higher quality of life.    Baseline 12/11/20: 51; 03/02/21: 49; 06/11/21: 53; 08/20/21: 53; 11/12/21: 49; 12/21/21: 50/ 04/01/22: 45; 06/21/22: 47; 07/27/22: 45   Time 12    Period Weeks    Status Partially Met    Target Date 12/10/2022      PT LONG TERM GOAL #2   Title Patient will maintain Berg Balance score of at least 32/56 in order to demonstrate decreased fall risk during functional activities.    Baseline 12/11/20: 32/56; 03/02/21: 32/56; 06/11/21: 33/56; 08/20/2021: 34/56; 11/12/21: 34/56; 12/22/21: 34/56; 04/01/22: 35/56; 06/21/22: 31/56; 07/27/22: 29/56   Time 12    Period Weeks     Status On-going    Target Date 12/10/2022      PT LONG TERM GOAL #3   Title Patient will be able to maintain safe performance with ascending/descending a flight of stairs using lofstrand crutches and railing in order to allow for safe navigation of her home environment.    Baseline 12/11/20: currently able to use lofstrands and/or railing with supervision by family; 03/02/21: Good maintenance observed; 06/11/21: Good maintenance observed; 08/20/2021: Good maintenance observed; 11/13/21: Good maintenance observed, family reports it continues to be the most challenging activity for patient; 12/22/21: Pt demonstrates good maintenance with stairs. She ascends/descends the stairs at home as often as she can without using her chair stairlift; 04/01/22: Pt demonstrates good maintenance with stairs. She ascends/descends the stairs at home as often as she can without using her chair stairlift, pt also performs stairs in the community; 06/21/22: Pt and family reporting increased difficulty and fatigue when performing stairs at home. Pt reports that she is having more DOE; 07/27/22: Pt and family reporting more difficulty and fatigue when performing stairs at home as well as worsening DOE   Time 12    Period Weeks    Status On-going    Target Date 12/10/2022      PT LONG TERM GOAL #4   Title Patient will complete a TUG test in no more than 13.5 seconds for independent mobility and decreased fall risk maintenance    Baseline 12/11/20: 13.4s; 03/02/21: 13.5s; 06/11/21: 11.4s; 08/20/21: 12.35s; 11/12/21: 12.2s; 12/21/21: 12.0s; 04/01/22: 11.3s; 06/21/22: 13.4s; 07/27/22: 13.3s   Time 12    Period Weeks    Status On-going    Target Date 12/10/2022      PT LONG TERM GOAL #5   Title Patient will increase fastest 10 meter walk test to >0.81 m/s as to improve gait speed for better community ambulation and to reduce fall risk.    Baseline 12/11/20: 14.6s = 0.68 m/s; 03/02/21: 14.3s = 0.70 m/s; 06/11/21: 13.2s = 0.76 m/s; 08/20/21: 13.5s=  0.74 m/s; 11/12/21: 14.4s = 0.69 m/s; 12/21/21: 14.1s = 0.71 m/s; 04/01/22: 13.7s = 0.73 m/s; 06/21/22: 14.1s = 0.71 m/s; 07/27/22: 14.0s =  0.71 m/s   Time 12    Period Weeks    Status Partially Met    Target Date 12/10/2022              Plan  Clinical Impression Statement Pt demonstrates excellent motivation during session today. Focused exclusively on balance exercises during session today. Continued to incorporate dual motor tasks to simulate patient's challenge at home with transferring and carrying objects with single UE support from a lofstrand crutch. Balance exercises are the most challenging to reproduce at home without the verbal and tactile cues from therapist for proper technique. Utilized a variety of dynamic challenges today and repeated picking up and shooting a basketball as it requires a different challenge than most of the balance exercises she has completed in the past. Varied surfaces to add difficulty. Discharge attempted at the end of 2021 and pt demonstrated a drop in her function during her prolonged absence out of therapy despite good consistency with her HEP. Frequency was also decreased from every other week to once per month in an attempt to maintain as much as possible and unfortunately this resulted in an unacceptable decline in patient's function. Medicaid approved increase in frequency to 1x/wk until the end of the year to prevent further decline. Pt is regularly exercising at home with the assistance of family as needed/appropriate. She is unable to complete all necessary exercises safely at home either independently or with family assistance due to need from physical therapist to monitor and correct form and technique as well as to provide safe guarding. At this time therapist recommends continuing frequency of therapy to 1x/week for the remainder of 2023 due to patient's decline in function since therapy was decreased due to pressure from Medicaid as well as recent COVID  infection. Plan will be to return to therapy every other week in 2024 with focus on modifications to her HEP, family education, and maintenance of her function. Therapist will reassess patient's outcome measures at regular intervals to assess need for continued maintenance therapy frequency. Pt requires physical therapy intervention to maintain her functional status including LE strength, balance, and gait speed/quality and she has not achieved maximal benefit from therapy services. She will benefit from continued PT services to address these deficits and maintain her function at home and school.   Personal Factors and Comorbidities Comorbidity 1;Past/Current Experience;Time since onset of injury/illness/exacerbation;Transportation;Social Background    Comorbidities spina bifida    Examination-Activity Limitations Bend;Carry;Continence;Lift;Reach Overhead;Squat;Stairs;Stand;Transfers    Examination-Participation Restrictions Church;Community Activity;Driving;Laundry;Meal Prep;Volunteer;Yard Work    Stability/Clinical Decision Making Stable/Uncomplicated    Rehab Potential Good    Clinical Impairments Affecting Rehab Potential weakness and decreased standing balance    PT Frequency 1x/week   PT Duration 12 weeks    PT Treatment/Interventions ADLs/Self Care Home Management;Aquatic Therapy;Biofeedback; Moist Heat;Electrical Stimulation;DME Instruction;Gait training;Stair training;Functional mobility training;Neuromuscular re-education;Balance training;Therapeutic exercise;Therapeutic activities;Patient/family education;Orthotic Fit/Training;;Manual techniques;Passive range of motion;Energy conservation;Splinting;Taping;Visual/perceptual remediation/compensation;Cryotherapy    PT Next Visit Plan review and modify HEP as appropriate, balance and strength exercises    PT Home Exercise Plan Seated, supine, and core program: [email or web address]    Consulted and Agree with Plan of Care Patient             Phillips Grout PT, DPT, GCS Paelyn Smick, PT 10/14/2022, 4:12 PM

## 2022-10-21 ENCOUNTER — Ambulatory Visit: Payer: Medicaid Other | Attending: Student

## 2022-10-21 DIAGNOSIS — M6281 Muscle weakness (generalized): Secondary | ICD-10-CM | POA: Diagnosis present

## 2022-10-21 DIAGNOSIS — R2681 Unsteadiness on feet: Secondary | ICD-10-CM | POA: Insufficient documentation

## 2022-10-21 NOTE — Therapy (Signed)
OUTPATIENT PHYSICAL THERAPY TREATMENT NOTE  Patient Name: OLAR SANTINI MRN: [id number] DOB:1996-08-10, 26 y.o., female Today's Date: 10/22/2022  PCP: Wayland Denis, PA-C REFERRING PROVIDER: Wayland Denis, PA-C   PT End of Session - 10/21/22 1507     Visit Number 48    Number of Visits 25    Date for PT Re-Evaluation 12/10/22    Authorization Type eval: 3/32/95, recert: 1/88/41, 6/60/63, 08/20/21, 11/11/21, 02/04/22, 04/02/22, 06/21/22;    Authorization Time Period 08/09/22-10/31/22 12 PT visits, Medicaid  VL: 27 combined PT/OT/SLP per year  Auth: is required    Authorization - Visit Number 10    Authorization - Number of Visits 12    PT Start Time 0160    PT Stop Time 1600    PT Time Calculation (min) 45 min    Equipment Utilized During Treatment Gait belt;Other (comment)   Bilateral AFOs, lofstrand crutches   Activity Tolerance Patient tolerated treatment well    Behavior During Therapy WFL for tasks assessed/performed            Past Medical History:  Diagnosis Date   Neurogenic bladder    Neurogenic bowel    S/P VP shunt    Spina bifida [facility])    Past Surgical History:  Procedure Laterality Date   VENTRICULOPERITONEAL SHUNT     There are no problems to display for this patient.   REFERRING DIAG: R26.81 (ICD-10-CM) - Unsteadiness on feet  THERAPY DIAG: Muscle weakness (generalized)  Unsteadiness on feet  PERTINENT HISTORY: Patient is a pleasant 26 year old female who presents for continuation of care for generalized weakness/ coordination deficits secondary to diagnosis of spina bifida. PMH includes VP shunt, spina bifida, and neurogenic bowel/bladder. Patient was discharged from this clinic at the end of 2021 due to meeting therapy cap for insurance that year. Patient reports that she has continued performing her HEP but notes a decline when she stops therapy due to inability to safely perform all of the balance and strength exercises as at home. One of the  biggest challenge she is experiencing is stair navigation She reports having multiple near falls when negotiating stairs at home. Patient must ascend/descend stairs to reach bedroom upstairs increasing her risk for falls daily.   PRECAUTIONS: Fall risk OUTCOME MEASURES:   TEST 12/11/20 03/02/21 06/11/21 08/20/21 11/12/21 12/21/21 04/01/22 06/21/22 07/27/22 Interpretation  5 times sit<>stand 10.6s 9.6s 8.1s Not Tested 7.4s 8.2s 8.3s 8.6s 7.7s WNL  10 meter walk test 14.6s = 0.68 m/s 14.3s = 0.70 m/s 13.2s =  0.76 m/s 13.5s=0.74 m/s 14.4s = 0.69 m/s 14.1s = 0.71 m/s 13.7s = 0.73 m/s 14.1s = 0.71 m/s 14.0s =  0.71 m/s <1.0 m/s indicates increased risk for falls; limited community ambulator  Timed up and Go 13.4s 13.5s 11.4s 12.35s 12.2s 12.0s 11.3s 13.4s 13.3s WNL, use of lofstrand crutches, >14 sec indicates increased risk for falls  2 minute walk test _0  [employer] 264' with lofstrand crutches (post: HR: 150, 99%, BORG: 5/10) 233' with lofstrand crutches (post: HR: 145, 97%, BORG: 5/10) 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 She is completing her HEP without issue. No significant changes since the last therapy session. All equipment is functioning well at home. No pain reported upon arrival. No specific questions currently.   PAIN:  Are you having pain? No   TREATMENT     Neuromuscular Re-education  NuStep L1-3 BLE only x 5 minutes for warm-up during interval history intake (2 minutes unbilled);  All balance exercises performed in // bars without UE support unless otherwise specified; Forward/backward walking with single  Lofstrand while performing ball tosses with PT student, pt occasionally stopping to pick up ball from the floor; Attempted BOSU balance (round side up) however pt unable to perform due to difficulty; BOSU staggered stance balance with front foot on BOSU (round side up) alternating lead LE x multiple bouts each; Feet apart cone passes around body while balance ball on top with return pass on opposite side x multiple bouts; Airex balance beam side stepping with double Lofstrand support x multiple lengths; Sit to stand from regular height chair holding 6# med ball and lifting overhead x 10; 6" step-ups alternating leading LE with BUE support x 10 on each side; Feet apart dynamic reaching in multiple directions while playing tic-tac-toe on mirror with dry-erase marker;   Not performed: Feet apart 1# dumbbell (DB) alternating shoulder forward flexion x 10 BUE; Feet apart 1# DB alternating shoulder abduction x 10 BUE; Feet apart cone stacking alternating UE and alternating ipsilateral vs contralateral reaching x multiple bouts with each UE; BOSU (round side up) step-ups alternating leading LE with BUE support x 10 on each side; Staggered stance static balance alternating forward LE x 60s each; Split squats alternating forward LE x 10 each; Feet apart static balance eyes open/closed while holding a cone balancing a ball on top x 60s each; Feet apart balance with horizontal and vertical head turns x 60s each; Feet apart with balloon taps with second assist 2 x 60s; Airex feet apart static balance eyes open/closed while holding a cone balancing a ball on top x 60s each; Airex feet apart balance with horizontal and vertical head turns x 60s each; Airex staggered balance with front foot on 6" step alternating forward LE x 60s each; Total Gym (TG) Level 22 (L22) double leg squats x 10; TG L22 single leg squats x 10 BLE; TG L22 double leg jumps x 10; Mini squats with BUE support x 10;   PATIENT  EDUCATION: Education details: Pt educated throughout session about proper posture and technique with exercises. Improved exercise technique, movement at target joints, use of target muscles after min to mod verbal, visual, tactile cues. Reinforced importance of continued HEP Person educated: Patient Education method: Customer service manager Education comprehension: verbalized understanding and returned demonstration   HOME EXERCISE PROGRAM: Seated, supine, and core program: Medbridge Access Code: [email or web address]        PT Short Term Goals       PT SHORT TERM GOAL #1   Title Patient will report compliance with HEP for continued strengthening and stability during functional mobility.     Time 6    Period Weeks    Status Achieved              PT Long Term Goals      PT LONG TERM GOAL #1   Title Patient will increase her FOTO score to at least 59 to demonstrate increased function and mobility for higher quality of life.    Baseline 12/11/20:  51; 03/02/21: 49; 06/11/21: 53; 08/20/21: 53; 11/12/21: 49; 12/21/21: 50/ 04/01/22: 45; 06/21/22: 47; 07/27/22: 45   Time 12    Period Weeks    Status Partially Met    Target Date 12/10/2022      PT LONG TERM GOAL #2   Title Patient will maintain Berg Balance score of at least 32/56 in order to demonstrate decreased fall risk during functional activities.    Baseline 12/11/20: 32/56; 03/02/21: 32/56; 06/11/21: 33/56; 08/20/2021: 34/56; 11/12/21: 34/56; 12/22/21: 34/56; 04/01/22: 35/56; 06/21/22: 31/56; 07/27/22: 29/56   Time 12    Period Weeks    Status On-going    Target Date 12/10/2022      PT LONG TERM GOAL #3   Title Patient will be able to maintain safe performance with ascending/descending a flight of stairs using lofstrand crutches and railing in order to allow for safe navigation of her home environment.    Baseline 12/11/20: currently able to use lofstrands and/or railing with supervision by family; 03/02/21: Good maintenance observed; 06/11/21: Good  maintenance observed; 08/20/2021: Good maintenance observed; 11/13/21: Good maintenance observed, family reports it continues to be the most challenging activity for patient; 12/22/21: Pt demonstrates good maintenance with stairs. She ascends/descends the stairs at home as often as she can without using her chair stairlift; 04/01/22: Pt demonstrates good maintenance with stairs. She ascends/descends the stairs at home as often as she can without using her chair stairlift, pt also performs stairs in the community; 06/21/22: Pt and family reporting increased difficulty and fatigue when performing stairs at home. Pt reports that she is having more DOE; 07/27/22: Pt and family reporting more difficulty and fatigue when performing stairs at home as well as worsening DOE   Time 12    Period Weeks    Status On-going    Target Date 12/10/2022      PT LONG TERM GOAL #4   Title Patient will complete a TUG test in no more than 13.5 seconds for independent mobility and decreased fall risk maintenance    Baseline 12/11/20: 13.4s; 03/02/21: 13.5s; 06/11/21: 11.4s; 08/20/21: 12.35s; 11/12/21: 12.2s; 12/21/21: 12.0s; 04/01/22: 11.3s; 06/21/22: 13.4s; 07/27/22: 13.3s   Time 12    Period Weeks    Status On-going    Target Date 12/10/2022      PT LONG TERM GOAL #5   Title Patient will increase fastest 10 meter walk test to >0.81 m/s as to improve gait speed for better community ambulation and to reduce fall risk.    Baseline 12/11/20: 14.6s = 0.68 m/s; 03/02/21: 14.3s = 0.70 m/s; 06/11/21: 13.2s = 0.76 m/s; 08/20/21: 13.5s= 0.74 m/s; 11/12/21: 14.4s = 0.69 m/s; 12/21/21: 14.1s = 0.71 m/s; 04/01/22: 13.7s = 0.73 m/s; 06/21/22: 14.1s = 0.71 m/s; 07/27/22: 14.0s =  0.71 m/s   Time 12    Period Weeks    Status Partially Met    Target Date 12/10/2022              Plan     Clinical Impression Statement Pt demonstrates excellent motivation during session today. Focused exclusively on balance exercises during session today. Continued to  incorporate dual motor tasks to simulate patient's challenge at home. Utilized Airex balance beam and performed limited ambulation with a single Lofstrand crutch. Balance exercises are the most challenging to reproduce at home without the verbal and tactile cues from therapist for proper technique. Utilized a variety of dynamic challenges today including ball passes around body. Also challenged dynamic reaching. Discharge attempted at  the end of 2021 and pt demonstrated a drop in her function during her prolonged absence out of therapy despite good consistency with her HEP. Frequency was also decreased from every other week to once per month in an attempt to maintain as much as possible and unfortunately this resulted in an unacceptable decline in patient's function. Medicaid approved increase in frequency to 1x/wk until the end of the year to prevent further decline. Pt is regularly exercising at home with the assistance of family as needed/appropriate. She is unable to complete all necessary exercises safely at home either independently or with family assistance due to need from physical therapist to monitor and correct form and technique as well as to provide safe guarding. At this time therapist recommends continuing frequency of therapy to 1x/week for the remainder of 2023 due to patient's decline in function since therapy was decreased due to pressure from Medicaid as well as recent COVID infection. Plan will be to return to therapy every other week in 2024 with focus on modifications to her HEP, family education, and maintenance of her function. Therapist will reassess patient's outcome measures at regular intervals to assess need for continued maintenance therapy frequency. Pt requires physical therapy intervention to maintain her functional status including LE strength, balance, and gait speed/quality and she has not achieved maximal benefit from therapy services. She will benefit from continued PT services  to address these deficits and maintain her function at home and school.   Personal Factors and Comorbidities Comorbidity 1;Past/Current Experience;Time since onset of injury/illness/exacerbation;Transportation;Social Background    Comorbidities spina bifida    Examination-Activity Limitations Bend;Carry;Continence;Lift;Reach Overhead;Squat;Stairs;Stand;Transfers    Examination-Participation Restrictions Church;Community Activity;Driving;Laundry;Meal Prep;Volunteer;Yard Work    Stability/Clinical Decision Making Stable/Uncomplicated    Rehab Potential Good    Clinical Impairments Affecting Rehab Potential weakness and decreased standing balance    PT Frequency 1x/week   PT Duration 12 weeks    PT Treatment/Interventions ADLs/Self Care Home Management;Aquatic Therapy;Biofeedback; Moist Heat;Electrical Stimulation;DME Instruction;Gait training;Stair training;Functional mobility training;Neuromuscular re-education;Balance training;Therapeutic exercise;Therapeutic activities;Patient/family education;Orthotic Fit/Training;;Manual techniques;Passive range of motion;Energy conservation;Splinting;Taping;Visual/perceptual remediation/compensation;Cryotherapy    PT Next Visit Plan review and modify HEP as appropriate, balance and strength exercises    PT Home Exercise Plan Seated, supine, and core program: [email or web address]    Consulted and Agree with Plan of Care Patient             Phillips Grout PT, DPT, [facility] Rito Lecomte, PT 10/22/2022, 4:42 PM

## 2022-10-28 ENCOUNTER — Ambulatory Visit: Payer: Medicaid Other

## 2022-10-28 DIAGNOSIS — M6281 Muscle weakness (generalized): Secondary | ICD-10-CM | POA: Diagnosis not present

## 2022-10-28 DIAGNOSIS — R2681 Unsteadiness on feet: Secondary | ICD-10-CM

## 2022-10-28 NOTE — Therapy (Signed)
OUTPATIENT PHYSICAL THERAPY TREATMENT NOTE/GOAL UPDATE  Patient Name: Michelle Randall MRN: [id number] DOB:12-05-1995, 26 y.o., female Today's Date: 10/30/2022  PCP: Wayland Denis, PA-C REFERRING PROVIDER: Wayland Denis, PA-C   PT End of Session - 10/30/22 0959     Visit Number 49    Number of Visits 73    Date for PT Re-Evaluation 12/10/22    Authorization Type eval: 6/38/75, recert: 6/43/32, 9/51/88, 08/20/21, 11/11/21, 02/04/22, 04/02/22, 06/21/22;    Authorization Time Period 08/09/22-10/31/22 12 PT visits, Medicaid  VL: 27 combined PT/OT/SLP per year  Auth: is required    Authorization - Visit Number 11    Authorization - Number of Visits 12    PT Start Time 4166    PT Stop Time 1615    PT Time Calculation (min) 45 min    Equipment Utilized During Treatment Gait belt;Other (comment)   Bilateral AFOs, lofstrand crutches   Activity Tolerance Patient tolerated treatment well    Behavior During Therapy WFL for tasks assessed/performed            Past Medical History:  Diagnosis Date   Neurogenic bladder    Neurogenic bowel    S/P VP shunt    Spina bifida [facility])    Past Surgical History:  Procedure Laterality Date   VENTRICULOPERITONEAL SHUNT     There are no problems to display for this patient.   REFERRING DIAG: R26.81 (ICD-10-CM) - Unsteadiness on feet  THERAPY DIAG: Muscle weakness (generalized)  Unsteadiness on feet  PERTINENT HISTORY: Patient is a pleasant 26 year old female who presents for continuation of care for generalized weakness/ coordination deficits secondary to diagnosis of spina bifida. PMH includes VP shunt, spina bifida, and neurogenic bowel/bladder. Patient was discharged from this clinic at the end of 2021 due to meeting therapy cap for insurance that year. Patient reports that she has continued performing her HEP but notes a decline when she stops therapy due to inability to safely perform all of the balance and strength exercises as at home.  One of the biggest challenge she is experiencing is stair navigation She reports having multiple near falls when negotiating stairs at home. Patient must ascend/descend stairs to reach bedroom upstairs increasing her risk for falls daily.   PRECAUTIONS: Fall risk OUTCOME MEASURES:   TEST 12/11/20 03/02/21 06/11/21 08/20/21 11/12/21 12/21/21 04/01/22 06/21/22 07/27/22 Interpretation  5 times sit<>stand 10.6s 9.6s 8.1s Not Tested 7.4s 8.2s 8.3s 8.6s 7.7s WNL  10 meter walk test 14.6s = 0.68 m/s 14.3s = 0.70 m/s 13.2s =  0.76 m/s 13.5s=0.74 m/s 14.4s = 0.69 m/s 14.1s = 0.71 m/s 13.7s = 0.73 m/s 14.1s = 0.71 m/s 14.0s =  0.71 m/s <1.0 m/s indicates increased risk for falls; limited community ambulator  Timed up and Go 13.4s 13.5s 11.4s 12.35s 12.2s 12.0s 11.3s 13.4s 13.3s WNL, use of lofstrand crutches, >14 sec indicates increased risk for falls  2 minute walk test _0  [employer] 264' with lofstrand crutches (post: HR: 150, 99%, BORG: 5/10) 233' with lofstrand crutches (post: HR: 145, 97%, BORG: 5/10) 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 She is completing her HEP without issue. No significant changes since the last therapy session. She continues to notice difficulty with steps at home which she attributes to both a balance and strength issue. All equipment is functioning well at home. No pain reported upon arrival. No specific questions currently.   PAIN:  Are you having pain? No   TREATMENT       Neuromuscular Re-education  NuStep L1-3 BLE only x 5 minutes for warm-up during interval history intake; Pre-vitals: 128/77, HR:  114, SpO2%: 100%  Updated outcome measures and goals with patient today (see below):     OUTCOME MEASURES:   TEST 12/11/20 03/02/21 06/11/21 08/20/21 11/12/21 12/21/21 04/01/22 06/21/22 07/27/22 10/28/22 Interpretation  5 times sit<>stand 10.6s 9.6s 8.1s Not Tested 7.4s 8.2s 8.3s 8.6s 7.7s 7.7s WNL  10 meter walk test 14.6s = 0.68 m/s 14.3s = 0.70 m/s 13.2s =  0.76 m/s 13.5s=0.74 m/s 14.4s = 0.69 m/s 14.1s = 0.71 m/s 13.7s = 0.73 m/s 14.1s = 0.71 m/s 14.0s =  0.71 m/s 14.4s =  0.69 m/s <1.0 m/s indicates increased risk for falls; limited community ambulator  Timed up and Go 13.4s 13.5s 11.4s 12.35s 12.2s 12.0s 11.3s 13.4s 13.3s 13.4s WNL, use of lofstrand crutches, >14 sec indicates increased risk for falls  2 minute walk test _0  [employer]erred [employer]erred 264' with lofstrand crutches (post: HR: 150, 99%, BORG: 5/10) 233' with lofstrand crutches (post: HR: 145, 97%, BORG: 5/10) 258' with lofstrand crutches (post: BP: 123/74, HR: 130, 100%, BORG: 6/10) 1000 feet is community ambulator  BERG Balance Assessment _1   _2 _3 33/56 <36/56 (100% risk for falls), 37-45 (80% risk for falls); 46-51 (>50% risk for falls); 52-55 (lower risk <25% of falls)  FOTO _4 Goal is to reach 59    6" forward step ups alternating leading LE with BUE support x 10 on each side; Reinforced continued performance of HEP;    PATIENT EDUCATION: Education details: Pt educated throughout session about proper posture and technique with exercises. Improved exercise technique, movement at target joints, use of target muscles after min to mod verbal, visual, tactile cues. Reinforced importance of continued HEP Person educated: Patient Education method: Customer service manager Education comprehension: verbalized understanding and returned demonstration   HOME EXERCISE PROGRAM: Seated, supine, and core program: Medbridge Access  Code: [email or web address]        PT Short Term Goals       PT SHORT TERM GOAL #1   Title Patient will report compliance with HEP for continued strengthening and stability during functional mobility.     Time 6    Period Weeks    Status Achieved              PT Long Term Goals      PT LONG TERM GOAL #1   Title Patient will increase her FOTO score to at least 59 to demonstrate increased function and mobility for higher quality of life.    Baseline 12/11/20: 51; 03/02/21: 49; 06/11/21: 53; 08/20/21: 53; 11/12/21: 49; 12/21/21: 50/ 04/01/22: 45; 06/21/22: 47; 07/27/22: 45; 10/28/22: 50   Time 12    Period Weeks    Status Partially Met    Target Date 12/10/2022      PT LONG TERM GOAL #2   Title Patient will maintain Berg Balance score of at least  32/56 in order to demonstrate decreased fall risk during functional activities.    Baseline 12/11/20: 32/56; 03/02/21: 32/56; 06/11/21: 33/56; 08/20/2021: 34/56; 11/12/21: 34/56; 12/22/21: 34/56; 04/01/22: 35/56; 06/21/22: 31/56; 07/27/22: 29/56; 10/28/22: 33/56   Time 12    Period Weeks    Status On-going    Target Date 12/10/2022      PT LONG TERM GOAL #3   Title Patient will be able to maintain safe performance with ascending/descending a flight of stairs using lofstrand crutches and railing in order to allow for safe navigation of her home environment.    Baseline 12/11/20: currently able to use lofstrands and/or railing with supervision by family; 03/02/21: Good maintenance observed; 06/11/21: Good maintenance observed; 08/20/2021: Good maintenance observed; 11/13/21: Good maintenance observed, family reports it continues to be the most challenging activity for patient; 12/22/21: Pt demonstrates good maintenance with stairs. She ascends/descends the stairs at home as often as she can without using her chair stairlift; 04/01/22: Pt demonstrates good maintenance with stairs. She ascends/descends the stairs at home as often as she can without using her chair stairlift, pt  also performs stairs in the community; 06/21/22: Pt and family reporting increased difficulty and fatigue when performing stairs at home. Pt reports that she is having more DOE; 07/27/22: Pt and family reporting more difficulty and fatigue when performing stairs at home as well as worsening DOE; 10/28/22: Pt reports she continues have difficulty with steps due to balance and strength deficits   Time 12    Period Weeks    Status On-going    Target Date 12/10/2022      PT LONG TERM GOAL #4   Title Patient will complete a TUG test in no more than 13.5 seconds for independent mobility and decreased fall risk maintenance    Baseline 12/11/20: 13.4s; 03/02/21: 13.5s; 06/11/21: 11.4s; 08/20/21: 12.35s; 11/12/21: 12.2s; 12/21/21: 12.0s; 04/01/22: 11.3s; 06/21/22: 13.4s; 07/27/22: 13.3s; 10/28/22: 13.4s   Time 12    Period Weeks    Status On-going    Target Date 12/10/2022      PT LONG TERM GOAL #5   Title Patient will increase fastest 10 meter walk test to >0.81 m/s as to improve gait speed for better community ambulation and to reduce fall risk.    Baseline 12/11/20: 14.6s = 0.68 m/s; 03/02/21: 14.3s = 0.70 m/s; 06/11/21: 13.2s = 0.76 m/s; 08/20/21: 13.5s= 0.74 m/s; 11/12/21: 14.4s = 0.69 m/s; 12/21/21: 14.1s = 0.71 m/s; 04/01/22: 13.7s = 0.73 m/s; 06/21/22: 14.1s = 0.71 m/s; 07/27/22: 14.0s =  0.71 m/s; 10/28/22: 14.4s =  0.69 m/s   Time 12    Period Weeks    Status Partially Met    Target Date 12/10/2022              Plan     Clinical Impression Statement Pt demonstrates excellent motivation during session today. Updated outcome measures and goals with patient during visit. Her TUG remains essentially unchanged (13.4s) since it was last updated in September. This is still worse compared to when her therapy frequency was decreased. However her BERG has improved to 33/56 which is quite significant and is closer to where she was before her frequency was decreased (35/56 on 04/01/22). Her 5TSTS remained unchanged compared  to her last update however he 28mgait speed was slightly slower today. Patient's FOTO score improved to 50 indicating slight improvement in self-perceived function. Her Two Minute Walk Test improved from last update by 25' indicating slight improvement in her endurance. Overall  pt showed improvement in outcome measures since increasing her frequency which is encouraging. Discharge attempted at the end of 2021 and pt demonstrated a drop in her function during her prolonged absence out of therapy despite good consistency with her HEP. Frequency was also decreased from every other week to once per month in an attempt to maintain as much as possible and unfortunately this resulted in an unacceptable decline in patient's function. This is patient's last approved visit of 2023 and pt agrees to decrease frequency to every other week at the start of 2024 in order to maintain her function throughout 2025. Pt is regularly exercising at home with the assistance of family as needed/appropriate. She is unable to complete all necessary exercises safely at home either independently or with family assistance due to need from physical therapist to monitor and correct form and technique as well as to provide safe guarding. At this time therapist recommends decreasing frequency of therapy to 1 visit every other week. Provided continued reinforcement regarding the importance of consistency with HEP and therapy sessions will focus on appropriate modifications to her HEP, family education, and maintenance of her function. Therapist will reassess patient's outcome measures at regular intervals to assess need for continued maintenance therapy frequency. Pt requires physical therapy intervention to maintain her functional status including LE strength, balance, and gait speed/quality and she has not achieved maximal benefit from therapy services. She will benefit from continued PT services to address these deficits and maintain her function  at home and school.   Personal Factors and Comorbidities Comorbidity 1;Past/Current Experience;Time since onset of injury/illness/exacerbation;Transportation;Social Background    Comorbidities spina bifida    Examination-Activity Limitations Bend;Carry;Continence;Lift;Reach Overhead;Squat;Stairs;Stand;Transfers    Examination-Participation Restrictions Church;Community Activity;Driving;Laundry;Meal Prep;Volunteer;Yard Work    Stability/Clinical Decision Making Stable/Uncomplicated    Rehab Potential Good    Clinical Impairments Affecting Rehab Potential weakness and decreased standing balance    PT Frequency 1 visit every other week   PT Duration 12 weeks    PT Treatment/Interventions ADLs/Self Care Home Management;Aquatic Therapy;Biofeedback; Moist Heat;Electrical Stimulation;DME Instruction;Gait training;Stair training;Functional mobility training;Neuromuscular re-education;Balance training;Therapeutic exercise;Therapeutic activities;Patient/family education;Orthotic Fit/Training;;Manual techniques;Passive range of motion;Energy conservation;Splinting;Taping;Visual/perceptual remediation/compensation;Cryotherapy    PT Next Visit Plan review and modify HEP as appropriate, balance and strength exercises    PT Home Exercise Plan Seated, supine, and core program: [email or web address]    Consulted and Agree with Plan of Care Patient             Phillips Grout PT, DPT, GCS Johnnye Sandford, PT 10/30/2022, 10:17 AM

## 2022-11-18 ENCOUNTER — Ambulatory Visit: Payer: Medicaid Other

## 2022-12-02 ENCOUNTER — Ambulatory Visit: Payer: Medicaid Other | Attending: Student

## 2022-12-02 DIAGNOSIS — M6281 Muscle weakness (generalized): Secondary | ICD-10-CM | POA: Insufficient documentation

## 2022-12-02 DIAGNOSIS — R2681 Unsteadiness on feet: Secondary | ICD-10-CM | POA: Diagnosis present

## 2022-12-02 NOTE — Therapy (Signed)
OUTPATIENT PHYSICAL THERAPY TREATMENT NOTE/PROGRESS NOTE  Dates of reporting period  08/19/22   to   12/02/22   Patient Name: Michelle Randall MRN: [id number] DOB:1996-06-22, 27 y.o., female Today's Date: 12/03/2022  PCP: Wayland Denis, PA-C REFERRING PROVIDER: Wayland Denis, PA-C   PT End of Session - 12/02/22 1451     Visit Number 50    Number of Visits 71    Date for PT Re-Evaluation 12/10/22    Authorization Type eval: 9/93/71, recert: 6/96/78, 9/38/10, 08/20/21, 11/11/21, 02/04/22, 04/02/22, 06/21/22;    Authorization Time Period [facility] auth 1/4-12/18/22 for 3 PT visits  Medicaid 2024, Medicaid  VL: 27 combined PT/OT/SLP per year  Auth: is required    Authorization - Visit Number 1    Authorization - Number of Visits 3    PT Start Time 1751    PT Stop Time 1530    PT Time Calculation (min) 45 min    Equipment Utilized During Treatment Gait belt   Bilateral AFOs, lofstrand crutches   Activity Tolerance Patient tolerated treatment well    Behavior During Therapy WFL for tasks assessed/performed            Past Medical History:  Diagnosis Date   Neurogenic bladder    Neurogenic bowel    S/P VP shunt    Spina bifida ([place])    Past Surgical History:  Procedure Laterality Date   VENTRICULOPERITONEAL SHUNT     There are no problems to display for this patient.   REFERRING DIAG: R26.81 (ICD-10-CM) - Unsteadiness on feet  THERAPY DIAG: Muscle weakness (generalized)  Unsteadiness on feet  PERTINENT HISTORY: Patient is a pleasant 27 year old female who presents for continuation of care for generalized weakness/ coordination deficits secondary to diagnosis of spina bifida. PMH includes VP shunt, spina bifida, and neurogenic bowel/bladder. Patient was discharged from this clinic at the end of 2021 due to meeting therapy cap for insurance that year. Patient reports that she has continued performing her HEP but notes a decline when she stops therapy due to inability to safely  perform all of the balance and strength exercises as at home. One of the biggest challenge she is experiencing is stair navigation She reports having multiple near falls when negotiating stairs at home. Patient must ascend/descend stairs to reach bedroom upstairs increasing her risk for falls daily.   PRECAUTIONS: Fall risk OUTCOME MEASURES:   TEST 12/11/20 03/02/21 06/11/21 08/20/21 11/12/21 12/21/21 04/01/22 06/21/22 07/27/22 10/28/22 Interpretation  5 times sit<>stand 10.6s 9.6s 8.1s Not Tested 7.4s 8.2s 8.3s 8.6s 7.7s 7.7s WNL  10 meter walk test 14.6s = 0.68 m/s 14.3s = 0.70 m/s 13.2s =  0.76 m/s 13.5s=0.74 m/s 14.4s = 0.69 m/s 14.1s = 0.71 m/s 13.7s = 0.73 m/s 14.1s = 0.71 m/s 14.0s =  0.71 m/s 14.4s =  0.69 m/s <1.0 m/s indicates increased risk for falls; limited community ambulator  Timed up and Go 13.4s 13.5s 11.4s 12.35s 12.2s 12.0s 11.3s 13.4s 13.3s 13.4s WNL, use of lofstrand crutches, >14 sec indicates increased risk for falls  2 minute walk test Deferred Deferred Deferred Deferred Deferred Deferred Deferred 264' with lofstrand crutches (post: HR: 150, 99%, BORG: 5/10) 233' with lofstrand crutches (post: HR: 145, 97%, BORG: 5/10) 258' with lofstrand crutches (post: BP: 123/74, HR: 130, 100%, BORG: 6/10) 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 She is completing her HEP without issue. No significant changes since the last therapy session. She continues to notice difficulty with steps at home as well as balancing with a single lofstrand while using her contralateral UE. All equipment is functioning well at home. No pain reported upon arrival. No specific questions  currently.   PAIN:  Are you having pain? No   TREATMENT       Neuromuscular Re-education  All balance exercises performed in // bars without UE support unless otherwise specified; Forward gait in // bars with single Lofstrand crutch (LUE) holding a cone in the contralateral hand while balancing ball on top x multiple laps; Sidestepping in // bars with single Lofstrand crutch (LUE) holding a cone in the contralateral hand while balancing ball on top x multiple laps; Obstacle course in // bars with bilateral Lofstrand crutches stepping over 1"x4" boards" Airex feet apart static balance eyes open/closed while holding a cone balancing a ball on top x 60s each; Airex feet apart balance with horizontal and vertical head turns x 60s each;   Ther-ex  NuStep L1-3 BLE only x 5 minutes for warm-up during interval history intake; 6" forward step ups alternating leading LE with BUE support x 10 on each side; Seated LAQ with manual resistance from therapist x 10 BLE; Reinforced continued performance of HEP;   Not performed: Feet apart 1# dumbbell (DB) alternating shoulder forward flexion x 10 BUE; Feet apart 1# DB alternating shoulder abduction x 10 BUE; Feet apart cone stacking alternating UE and alternating ipsilateral vs contralateral reaching x multiple bouts with each UE; BOSU (round side up) step-ups alternating leading LE with BUE support x 10 on each side; Staggered stance static balance alternating forward LE x 60s each; Split squats alternating forward LE x 10 each; Feet apart static balance eyes open/closed while holding a cone balancing a ball on top x 60s each; Feet apart balance with horizontal and vertical head turns x 60s each; Feet apart with balloon taps with second assist 2 x 60s; Airex staggered balance with front foot on 6" step alternating forward LE x 60s each; Total Gym (TG) Level 22 (L22) double leg squats x 10; TG L22 single leg squats x 10 BLE; TG L22 double leg  jumps x 10; Mini squats with BUE support x 10; Sit to stand from regular height chair without UE support 2 x 10; Feet together (FT) ball passes around body with therapist adjusting height and distance from patient x multiple bouts on each side; FT dynamic reaching hand taps with therapist adjusting height and distance from patient as well as challenging pt to cross midline x multiple bouts on each side; Semitandem balance x 45s with each foot forward; Feet apart basketball tosses with pt picking up the ball from the floor and shooting x multiple bouts; Airex feet apart basketball tosses with pt picking up the ball from the floor and shooting x multiple bouts;     PATIENT EDUCATION: Education details: Pt educated throughout session about proper posture and technique with exercises. Improved exercise technique, movement at target joints, use of target muscles after min to mod verbal, visual, tactile cues. Reinforced importance of continued HEP Person educated: Patient Education method: Explanation  and Demonstration Education comprehension: verbalized understanding and returned demonstration   HOME EXERCISE PROGRAM: Seated, supine, and core program: Medbridge Access Code: [email or web address]        PT Short Term Goals       PT SHORT TERM GOAL #1   Title Patient will report compliance with HEP for continued strengthening and stability during functional mobility.     Time 6    Period Weeks    Status Achieved              PT Long Term Goals      PT LONG TERM GOAL #1   Title Patient will increase her FOTO score to at least 59 to demonstrate increased function and mobility for higher quality of life.    Baseline 12/11/20: 51; 03/02/21: 49; 06/11/21: 53; 08/20/21: 53; 11/12/21: 49; 12/21/21: 50/ 04/01/22: 45; 06/21/22: 47; 07/27/22: 45; 10/28/22: 50   Time 12    Period Weeks    Status Partially Met    Target Date 12/10/2022      PT LONG TERM GOAL #2   Title Patient will maintain Berg Balance  score of at least 32/56 in order to demonstrate decreased fall risk during functional activities.    Baseline 12/11/20: 32/56; 03/02/21: 32/56; 06/11/21: 33/56; 08/20/2021: 34/56; 11/12/21: 34/56; 12/22/21: 34/56; 04/01/22: 35/56; 06/21/22: 31/56; 07/27/22: 29/56; 10/28/22: 33/56   Time 12    Period Weeks    Status On-going    Target Date 12/10/2022      PT LONG TERM GOAL #3   Title Patient will be able to maintain safe performance with ascending/descending a flight of stairs using lofstrand crutches and railing in order to allow for safe navigation of her home environment.    Baseline 12/11/20: currently able to use lofstrands and/or railing with supervision by family; 03/02/21: Good maintenance observed; 06/11/21: Good maintenance observed; 08/20/2021: Good maintenance observed; 11/13/21: Good maintenance observed, family reports it continues to be the most challenging activity for patient; 12/22/21: Pt demonstrates good maintenance with stairs. She ascends/descends the stairs at home as often as she can without using her chair stairlift; 04/01/22: Pt demonstrates good maintenance with stairs. She ascends/descends the stairs at home as often as she can without using her chair stairlift, pt also performs stairs in the community; 06/21/22: Pt and family reporting increased difficulty and fatigue when performing stairs at home. Pt reports that she is having more DOE; 07/27/22: Pt and family reporting more difficulty and fatigue when performing stairs at home as well as worsening DOE; 10/28/22: Pt reports she continues have difficulty with steps due to balance and strength deficits   Time 12    Period Weeks    Status On-going    Target Date 12/10/2022      PT LONG TERM GOAL #4   Title Patient will complete a TUG test in no more than 13.5 seconds for independent mobility and decreased fall risk maintenance    Baseline 12/11/20: 13.4s; 03/02/21: 13.5s; 06/11/21: 11.4s; 08/20/21: 12.35s; 11/12/21: 12.2s; 12/21/21: 12.0s; 04/01/22:  11.3s; 06/21/22: 13.4s; 07/27/22: 13.3s; 10/28/22: 13.4s   Time 12    Period Weeks    Status On-going    Target Date 12/10/2022      PT LONG TERM GOAL #5   Title Patient will increase fastest 10 meter walk test to >0.81 m/s as to improve gait speed for better community ambulation and to reduce fall risk.    Baseline 12/11/20: 14.6s = 0.68 m/s; 03/02/21: 14.3s = 0.70 m/s; 06/11/21: 13.2s =  0.76 m/s; 08/20/21: 13.5s= 0.74 m/s; 11/12/21: 14.4s = 0.69 m/s; 12/21/21: 14.1s = 0.71 m/s; 04/01/22: 13.7s = 0.73 m/s; 06/21/22: 14.1s = 0.71 m/s; 07/27/22: 14.0s =  0.71 m/s; 10/28/22: 14.4s =  0.69 m/s   Time 12    Period Weeks    Status Partially Met    Target Date 12/10/2022              Plan     Clinical Impression Statement Pt demonstrates excellent motivation during session today. Updated outcome measures and goals with patient during 10/28/22 visit. Her TUG remained essentially unchanged (13.4s) since it was last updated in September. This is still worse compared to when her therapy frequency was decreased. However her BERG has improved to 33/56 which is quite significant and is closer to where she was before her frequency was decreased (35/56 on 04/01/22). Her 5TSTS remained unchanged compared to her last update however he 68m gait speed was slightly slower. Patient's FOTO score improved to 50 indicating slight improvement in self-perceived function. Her Two Minute Walk Test improved from last update by 25' indicating slight improvement in her endurance. Overall pt showed improvement in outcome measures since increasing her frequency which is encouraging. Discharge attempted at the end of 2021 and pt demonstrated a drop in her function during her prolonged absence out of therapy despite good consistency with her HEP. Frequency was also decreased from every other week to once per month in an attempt to maintain as much as possible and unfortunately this resulted in an unacceptable decline in patient's function. Plan  is to maintain frequency at every other week throughout 2024 in order to maintain her function. Pt is regularly exercising at home with the assistance of family as needed/appropriate. She is unable to complete all necessary exercises safely at home either independently or with family assistance due to need from physical therapist to monitor and correct form and technique as well as to provide safe guarding. Provided continued reinforcement regarding the importance of consistency with HEP and therapy sessions will focus on appropriate modifications to her HEP, family education, and maintenance of her function. Therapist will reassess patient's outcome measures at regular intervals to assess need for continued maintenance therapy frequency. Pt requires physical therapy intervention to maintain her functional status including LE strength, balance, and gait speed/quality and she has not achieved maximal benefit from therapy services. She will benefit from continued PT services to address these deficits and maintain her function at home and school.   Personal Factors and Comorbidities Comorbidity 1;Past/Current Experience;Time since onset of injury/illness/exacerbation;Transportation;Social Background    Comorbidities spina bifida    Examination-Activity Limitations Bend;Carry;Continence;Lift;Reach Overhead;Squat;Stairs;Stand;Transfers    Examination-Participation Restrictions Church;Community Activity;Driving;Laundry;Meal Prep;Volunteer;Yard Work    Stability/Clinical Decision Making Stable/Uncomplicated    Rehab Potential Good    Clinical Impairments Affecting Rehab Potential weakness and decreased standing balance    PT Frequency 1 visit every other week   PT Duration 12 weeks    PT Treatment/Interventions ADLs/Self Care Home Management;Aquatic Therapy;Biofeedback; Moist Heat;Electrical Stimulation;DME Instruction;Gait training;Stair training;Functional mobility training;Neuromuscular re-education;Balance  training;Therapeutic exercise;Therapeutic activities;Patient/family education;Orthotic Fit/Training;;Manual techniques;Passive range of motion;Energy conservation;Splinting;Taping;Visual/perceptual remediation/compensation;Cryotherapy    PT Next Visit Plan review and modify HEP as appropriate, balance and strength exercises    PT Home Exercise Plan Seated, supine, and core program: [email or web address]    Consulted and Agree with Plan of Care Patient             Phillips Grout PT, DPT, [facility] Javyn Havlin, PT 12/03/2022, 1:41 PM

## 2022-12-14 NOTE — Therapy (Incomplete)
OUTPATIENT PHYSICAL THERAPY TREATMENT NOTE  Patient Name: Michelle Randall MRN: [id number] DOB:[birth year], 27 y.o., female Today's Date: 12/14/2022  PCP: Carren Rang, PA-C REFERRING PROVIDER: Carren Rang, PA-C    Past Medical History:  Diagnosis Date   Neurogenic bladder    Neurogenic bowel    S/P VP shunt    Spina bifida [facility])    Past Surgical History:  Procedure Laterality Date   VENTRICULOPERITONEAL SHUNT     There are no problems to display for this patient.   REFERRING DIAG: R26.81 (ICD-10-CM) - Unsteadiness on feet  THERAPY DIAG: Muscle weakness (generalized)  Unsteadiness on feet  PERTINENT HISTORY: Patient is a pleasant 27 year old female who presents for continuation of care for generalized weakness/ coordination deficits secondary to diagnosis of spina bifida. PMH includes VP shunt, spina bifida, and neurogenic bowel/bladder. Patient was discharged from this clinic at the end of 2021 due to meeting therapy cap for insurance that year. Patient reports that she has continued performing her HEP but notes a decline when she stops therapy due to inability to safely perform all of the balance and strength exercises as at home. One of the biggest challenge she is experiencing is stair navigation She reports having multiple near falls when negotiating stairs at home. Patient must ascend/descend stairs to reach bedroom upstairs increasing her risk for falls daily.   PRECAUTIONS: Fall risk OUTCOME MEASURES:   TEST 12/11/20 03/02/21 06/11/21 08/20/21 11/12/21 12/21/21 04/01/22 06/21/22 07/27/22 10/28/22 Interpretation  5 times sit<>stand 10.6s 9.6s 8.1s Not Tested 7.4s 8.2s 8.3s 8.6s 7.7s 7.7s WNL  10 meter walk test 14.6s = 0.68 m/s 14.3s = 0.70 m/s 13.2s =  0.76 m/s 13.5s=0.74 m/s 14.4s = 0.69 m/s 14.1s = 0.71 m/s 13.7s = 0.73 m/s 14.1s = 0.71 m/s 14.0s =  0.71 m/s 14.4s =  0.69 m/s <1.0 m/s indicates increased risk for falls; limited community ambulator  Timed up and Go  13.4s 13.5s 11.4s 12.35s 12.2s 12.0s 11.3s 13.4s 13.3s 13.4s WNL, use of lofstrand crutches, >14 sec indicates increased risk for falls  2 minute walk test Deferred Deferred Deferred Deferred Deferred [employer] Deferred 264' with lofstrand crutches (post: HR: 150, 99%, BORG: 5/10) 233' with lofstrand crutches (post: HR: 145, 97%, BORG: 5/10) 258' with lofstrand crutches (post: BP: 123/74, HR: 130, 100%, BORG: 6/10) 1000 feet is Marine scientist Assessment 32/56 32/56 33/56    34/56 34/56 34/56  35/56 31/56 29/56  33/56 <36/56 (100% risk for falls), 37-45 (80% risk for falls); 46-51 (>50% risk for falls); 52-55 (lower risk <25% of falls)  FOTO 51 49 53 53 49 50 45 47 45 50  Goal is to reach 59      TODAY'S TREATMENT:      SUBJECTIVE: Pt reports that she is doing well today. She is completing her HEP without issue. No significant changes since the last therapy session. She continues to notice difficulty with steps at home as well as balancing with a single lofstrand while using her contralateral UE. All equipment is functioning well at home. No pain reported upon arrival. No specific questions currently.   PAIN:  Are you having pain? No   TREATMENT       Neuromuscular Re-education  All balance exercises performed in // bars without UE support unless otherwise specified; Forward gait in // bars with single Lofstrand crutch (LUE) holding a cone in the contralateral hand while balancing ball on top x multiple laps; Sidestepping in // bars with single Lofstrand crutch (LUE) holding  a cone in the contralateral hand while balancing ball on top x multiple laps; Obstacle course in // bars with bilateral Lofstrand crutches stepping over 1"x4" boards" Airex feet apart static balance eyes open/closed while holding a cone balancing a ball on top x 60s each; Airex feet apart balance with horizontal and vertical head turns x 60s each;   Ther-ex  NuStep L1-3 BLE only x 5 minutes for warm-up  during interval history intake; 6" forward step ups alternating leading LE with BUE support x 10 on each side; Seated LAQ with manual resistance from therapist x 10 BLE; Reinforced continued performance of HEP;   Not performed: Feet apart 1# dumbbell (DB) alternating shoulder forward flexion x 10 BUE; Feet apart 1# DB alternating shoulder abduction x 10 BUE; Feet apart cone stacking alternating UE and alternating ipsilateral vs contralateral reaching x multiple bouts with each UE; BOSU (round side up) step-ups alternating leading LE with BUE support x 10 on each side; Staggered stance static balance alternating forward LE x 60s each; Split squats alternating forward LE x 10 each; Feet apart static balance eyes open/closed while holding a cone balancing a ball on top x 60s each; Feet apart balance with horizontal and vertical head turns x 60s each; Feet apart with balloon taps with second assist 2 x 60s; Airex staggered balance with front foot on 6" step alternating forward LE x 60s each; Total Gym (TG) Level 22 (L22) double leg squats x 10; TG L22 single leg squats x 10 BLE; TG L22 double leg jumps x 10; Mini squats with BUE support x 10; Sit to stand from regular height chair without UE support 2 x 10; Feet together (FT) ball passes around body with therapist adjusting height and distance from patient x multiple bouts on each side; FT dynamic reaching hand taps with therapist adjusting height and distance from patient as well as challenging pt to cross midline x multiple bouts on each side; Semitandem balance x 45s with each foot forward; Feet apart basketball tosses with pt picking up the ball from the floor and shooting x multiple bouts; Airex feet apart basketball tosses with pt picking up the ball from the floor and shooting x multiple bouts;     PATIENT EDUCATION: Education details: Pt educated throughout session about proper posture and technique with exercises. Improved  exercise technique, movement at target joints, use of target muscles after min to mod verbal, visual, tactile cues. Reinforced importance of continued HEP Person educated: Patient Education method: Customer service manager Education comprehension: verbalized understanding and returned demonstration   HOME EXERCISE PROGRAM: Seated, supine, and core program: Medbridge Access Code: [email or web address]        PT Short Term Goals       PT SHORT TERM GOAL #1   Title Patient will report compliance with HEP for continued strengthening and stability during functional mobility.     Time 6    Period Weeks    Status Achieved              PT Long Term Goals      PT LONG TERM GOAL #1   Title Patient will increase her FOTO score to at least 59 to demonstrate increased function and mobility for higher quality of life.    Baseline 12/11/20: 51; 03/02/21: 49; 06/11/21: 53; 08/20/21: 53; 11/12/21: 49; 12/21/21: 50/ 04/01/22: 45; 06/21/22: 47; 07/27/22: 45; 10/28/22: 50   Time 12    Period Weeks    Status Partially Met  Target Date 12/10/2022      PT LONG TERM GOAL #2   Title Patient will maintain Berg Balance score of at least 32/56 in order to demonstrate decreased fall risk during functional activities.    Baseline 12/11/20: 32/56; 03/02/21: 32/56; 06/11/21: 33/56; 08/20/2021: 34/56; 11/12/21: 34/56; 12/22/21: 34/56; 04/01/22: 35/56; 06/21/22: 31/56; 07/27/22: 29/56; 10/28/22: 33/56   Time 12    Period Weeks    Status On-going    Target Date 12/10/2022      PT LONG TERM GOAL #3   Title Patient will be able to maintain safe performance with ascending/descending a flight of stairs using lofstrand crutches and railing in order to allow for safe navigation of her home environment.    Baseline 12/11/20: currently able to use lofstrands and/or railing with supervision by family; 03/02/21: Good maintenance observed; 06/11/21: Good maintenance observed; 08/20/2021: Good maintenance observed; 11/13/21: Good maintenance  observed, family reports it continues to be the most challenging activity for patient; 12/22/21: Pt demonstrates good maintenance with stairs. She ascends/descends the stairs at home as often as she can without using her chair stairlift; 04/01/22: Pt demonstrates good maintenance with stairs. She ascends/descends the stairs at home as often as she can without using her chair stairlift, pt also performs stairs in the community; 06/21/22: Pt and family reporting increased difficulty and fatigue when performing stairs at home. Pt reports that she is having more DOE; 07/27/22: Pt and family reporting more difficulty and fatigue when performing stairs at home as well as worsening DOE; 10/28/22: Pt reports she continues have difficulty with steps due to balance and strength deficits   Time 12    Period Weeks    Status On-going    Target Date 12/10/2022      PT LONG TERM GOAL #4   Title Patient will complete a TUG test in no more than 13.5 seconds for independent mobility and decreased fall risk maintenance    Baseline 12/11/20: 13.4s; 03/02/21: 13.5s; 06/11/21: 11.4s; 08/20/21: 12.35s; 11/12/21: 12.2s; 12/21/21: 12.0s; 04/01/22: 11.3s; 06/21/22: 13.4s; 07/27/22: 13.3s; 10/28/22: 13.4s   Time 12    Period Weeks    Status On-going    Target Date 12/10/2022      PT LONG TERM GOAL #5   Title Patient will increase fastest 10 meter walk test to >0.81 m/s as to improve gait speed for better community ambulation and to reduce fall risk.    Baseline 12/11/20: 14.6s = 0.68 m/s; 03/02/21: 14.3s = 0.70 m/s; 06/11/21: 13.2s = 0.76 m/s; 08/20/21: 13.5s= 0.74 m/s; 11/12/21: 14.4s = 0.69 m/s; 12/21/21: 14.1s = 0.71 m/s; 04/01/22: 13.7s = 0.73 m/s; 06/21/22: 14.1s = 0.71 m/s; 07/27/22: 14.0s =  0.71 m/s; 10/28/22: 14.4s =  0.69 m/s   Time 12    Period Weeks    Status Partially Met    Target Date 12/10/2022              Plan     Clinical Impression Statement Pt demonstrates excellent motivation during session today. Updated outcome  measures and goals with patient during 10/28/22 visit. Her TUG remained essentially unchanged (13.4s) since it was last updated in September. This is still worse compared to when her therapy frequency was decreased. However her BERG has improved to 33/56 which is quite significant and is closer to where she was before her frequency was decreased (35/56 on 04/01/22). Her 5TSTS remained unchanged compared to her last update however he 65m gait speed was slightly slower. Patient's FOTO score improved to  50 indicating slight improvement in self-perceived function. Her Two Minute Walk Test improved from last update by 25' indicating slight improvement in her endurance. Overall pt showed improvement in outcome measures since increasing her frequency which is encouraging. Discharge attempted at the end of 2021 and pt demonstrated a drop in her function during her prolonged absence out of therapy despite good consistency with her HEP. Frequency was also decreased from every other week to once per month in an attempt to maintain as much as possible and unfortunately this resulted in an unacceptable decline in patient's function. Plan is to maintain frequency at every other week throughout 2024 in order to maintain her function. Pt is regularly exercising at home with the assistance of family as needed/appropriate. She is unable to complete all necessary exercises safely at home either independently or with family assistance due to need from physical therapist to monitor and correct form and technique as well as to provide safe guarding. Provided continued reinforcement regarding the importance of consistency with HEP and therapy sessions will focus on appropriate modifications to her HEP, family education, and maintenance of her function. Therapist will reassess patient's outcome measures at regular intervals to assess need for continued maintenance therapy frequency. Pt requires physical therapy intervention to maintain her  functional status including LE strength, balance, and gait speed/quality and she has not achieved maximal benefit from therapy services. She will benefit from continued PT services to address these deficits and maintain her function at home and school.   Personal Factors and Comorbidities Comorbidity 1;Past/Current Experience;Time since onset of injury/illness/exacerbation;Transportation;Social Background    Comorbidities spina bifida    Examination-Activity Limitations Bend;Carry;Continence;Lift;Reach Overhead;Squat;Stairs;Stand;Transfers    Examination-Participation Restrictions Church;Community Activity;Driving;Laundry;Meal Prep;Volunteer;Yard Work    Stability/Clinical Decision Making Stable/Uncomplicated    Rehab Potential Good    Clinical Impairments Affecting Rehab Potential weakness and decreased standing balance    PT Frequency 1 visit every other week   PT Duration 12 weeks    PT Treatment/Interventions ADLs/Self Care Home Management;Aquatic Therapy;Biofeedback; Moist Heat;Electrical Stimulation;DME Instruction;Gait training;Stair training;Functional mobility training;Neuromuscular re-education;Balance training;Therapeutic exercise;Therapeutic activities;Patient/family education;Orthotic Fit/Training;;Manual techniques;Passive range of motion;Energy conservation;Splinting;Taping;Visual/perceptual remediation/compensation;Cryotherapy    PT Next Visit Plan review and modify HEP as appropriate, balance and strength exercises    PT Home Exercise Plan Seated, supine, and core program: [email or web address]    Consulted and Agree with Plan of Care Patient             Lyndel Safe Alyxandria Wentz PT, DPT, GCS Lilit Cinelli, PT 12/14/2022, 10:03 AM

## 2022-12-16 ENCOUNTER — Ambulatory Visit: Payer: Medicaid Other | Attending: Student

## 2022-12-16 DIAGNOSIS — M6281 Muscle weakness (generalized): Secondary | ICD-10-CM | POA: Diagnosis present

## 2022-12-16 DIAGNOSIS — R2681 Unsteadiness on feet: Secondary | ICD-10-CM | POA: Diagnosis present

## 2022-12-16 DIAGNOSIS — R531 Weakness: Secondary | ICD-10-CM | POA: Insufficient documentation

## 2022-12-16 NOTE — Therapy (Signed)
OUTPATIENT PHYSICAL THERAPY TREATMENT NOTE/RECERTIFICATION  Patient Name: Michelle Randall MRN: [id number] DOB:01-25-1996, 27 y.o., female Today's Date: 12/19/2022  PCP: Wayland Denis, PA-C REFERRING PROVIDER: Wayland Denis, PA-C   PT End of Session - 12/19/22 1438     Visit Number 51    Number of Visits 36    Date for PT Re-Evaluation 03/10/23    Authorization Type eval: 04/28/42, recert: 1/54/00, 8/67/61, 08/20/21, 11/11/21, 02/04/22, 04/02/22, 06/21/22;    Authorization Time Period [facility] auth 1/4-12/18/22 for 3 PT visits  Medicaid 2024, Medicaid  VL: 27 combined PT/OT/SLP per year  Auth: is required    Authorization - Visit Number 2    Authorization - Number of Visits 3    PT Start Time 1700    PT Stop Time 1745    PT Time Calculation (min) 45 min    Equipment Utilized During Treatment Gait belt   Bilateral AFOs, lofstrand crutches   Activity Tolerance Patient tolerated treatment well    Behavior During Therapy WFL for tasks assessed/performed            Past Medical History:  Diagnosis Date   Neurogenic bladder    Neurogenic bowel    S/P VP shunt    Spina bifida ([place])    Past Surgical History:  Procedure Laterality Date   VENTRICULOPERITONEAL SHUNT     There are no problems to display for this patient.   REFERRING DIAG: R26.81 (ICD-10-CM) - Unsteadiness on feet  THERAPY DIAG: Muscle weakness (generalized)  Unsteadiness on feet  Weakness generalized  PERTINENT HISTORY: Patient is a pleasant 27 year old female who presents for continuation of care for generalized weakness/ coordination deficits secondary to diagnosis of spina bifida. PMH includes VP shunt, spina bifida, and neurogenic bowel/bladder. Patient was discharged from this clinic at the end of 2021 due to meeting therapy cap for insurance that year. Patient reports that she has continued performing her HEP but notes a decline when she stops therapy due to inability to safely perform all of the balance and  strength exercises as at home. One of the biggest challenge she is experiencing is stair navigation She reports having multiple near falls when negotiating stairs at home. Patient must ascend/descend stairs to reach bedroom upstairs increasing her risk for falls daily.   PRECAUTIONS: Fall risk OUTCOME MEASURES:   TEST 12/11/20 03/02/21 06/11/21 08/20/21 11/12/21 12/21/21 04/01/22 06/21/22 07/27/22 10/28/22 Interpretation  5 times sit<>stand 10.6s 9.6s 8.1s Not Tested 7.4s 8.2s 8.3s 8.6s 7.7s 7.7s WNL  10 meter walk test 14.6s = 0.68 m/s 14.3s = 0.70 m/s 13.2s =  0.76 m/s 13.5s=0.74 m/s 14.4s = 0.69 m/s 14.1s = 0.71 m/s 13.7s = 0.73 m/s 14.1s = 0.71 m/s 14.0s =  0.71 m/s 14.4s =  0.69 m/s <1.0 m/s indicates increased risk for falls; limited community ambulator  Timed up and Go 13.4s 13.5s 11.4s 12.35s 12.2s 12.0s 11.3s 13.4s 13.3s 13.4s WNL, use of lofstrand crutches, >14 sec indicates increased risk for falls  2 minute walk test Deferred Deferred Deferred Deferred Deferred [employer] 264' with lofstrand crutches (post: HR: 150, 99%, BORG: 5/10) 233' with lofstrand crutches (post: HR: 145, 97%, BORG: 5/10) 258' with lofstrand crutches (post: BP: 123/74, HR: 130, 100%, BORG: 6/10) 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 She is completing her HEP without issue. She continues to notice difficulty with steps at home as well as balancing with a single lofstrand while using her contralateral UE. Patient's father is at appointment today and states that he has noticed worsening weakness with patient recently.   PAIN:  Are you having pain? No   TREATMENT        Neuromuscular Re-education  All balance exercises performed in // bars without UE support unless otherwise specified; Forward/retro gait in // bars with single Lofstrand crutch (LUE) holding a cone in the contralateral hand x multiple laps; Sidestepping in // bars with single Lofstrand crutch (LUE) holding a cone in the contralateral hand x multiple laps; High knee forward marches with BUE support 12' x 2; Airex feet apart balance with dynamic reaching in multiple directs to challenge base of support;   Ther-ex  NuStep L1-3 BLE only x 5 minutes for warm-up during interval history intake; 6" forward step ups alternating leading LE with BUE support x 10 on each side; Total Gym (TG) Level 22 (L22) double leg squats x 10; TG L22 single leg squats 2 x 10 BLE; Split squats alternating forward LE x 10 each; Reinforced continued performance of HEP;   Not performed: Feet apart 1# dumbbell (DB) alternating shoulder forward flexion x 10 BUE; Feet apart 1# DB alternating shoulder abduction x 10 BUE; Feet apart cone stacking alternating UE and alternating ipsilateral vs contralateral reaching x multiple bouts with each UE; BOSU (round side up) step-ups alternating leading LE with BUE support x 10 on each side; Staggered stance static balance alternating forward LE x 60s each; Feet apart static balance eyes open/closed while holding a cone balancing a ball on top x 60s each; Feet apart balance with horizontal and vertical head turns x 60s each; Feet apart with balloon taps with second assist 2 x 60s; Airex staggered balance with front foot on 6" step alternating forward LE x 60s each; TG L22 double leg jumps x 10; Mini squats with BUE support x 10; Sit to stand from regular height chair without UE support 2 x 10; Feet together (FT) ball passes around body with therapist adjusting height and distance from patient x multiple bouts on each side; FT dynamic reaching hand taps with therapist adjusting  height and distance from patient as well as challenging pt to cross midline x multiple bouts on each side; Semitandem balance x 45s with each foot forward; Feet apart basketball tosses with pt picking up the ball from the floor and shooting x multiple bouts; Airex feet apart basketball tosses with pt picking up the ball from the floor and shooting x multiple bouts; Seated LAQ with manual resistance from therapist x 10 BLE;    PATIENT EDUCATION: Education details: Pt educated throughout session about proper posture and technique with exercises. Improved exercise technique, movement at target joints, use of target muscles after min to mod verbal, visual, tactile cues. Reinforced importance of continued HEP Person educated: Patient Education method: Explanation and Demonstration Education comprehension: verbalized understanding and returned demonstration   HOME EXERCISE PROGRAM: Seated, supine, and core program: Medbridge Access Code: [email or web address]        PT Short Term Goals       PT SHORT TERM GOAL #1   Title Patient  will report compliance with HEP for continued strengthening and stability during functional mobility.     Time 6    Period Weeks    Status Achieved              PT Long Term Goals      PT LONG TERM GOAL #1   Title Patient will increase her FOTO score to at least 59 to demonstrate increased function and mobility for higher quality of life.    Baseline 12/11/20: 51; 03/02/21: 49; 06/11/21: 53; 08/20/21: 53; 11/12/21: 49; 12/21/21: 50/ 04/01/22: 45; 06/21/22: 47; 07/27/22: 45; 10/28/22: 50   Time 12    Period Weeks    Status Partially Met    Target Date 03/10/2023      PT LONG TERM GOAL #2   Title Patient will maintain Berg Balance score of at least 32/56 in order to demonstrate decreased fall risk during functional activities.    Baseline 12/11/20: 32/56; 03/02/21: 32/56; 06/11/21: 33/56; 08/20/2021: 34/56; 11/12/21: 34/56; 12/22/21: 34/56; 04/01/22: 35/56; 06/21/22: 31/56; 07/27/22:  29/56; 10/28/22: 33/56   Time 12    Period Weeks    Status On-going    Target Date 03/10/2023      PT LONG TERM GOAL #3   Title Patient will be able to maintain safe performance with ascending/descending a flight of stairs using lofstrand crutches and railing in order to allow for safe navigation of her home environment.    Baseline 12/11/20: currently able to use lofstrands and/or railing with supervision by family; 03/02/21: Good maintenance observed; 06/11/21: Good maintenance observed; 08/20/2021: Good maintenance observed; 11/13/21: Good maintenance observed, family reports it continues to be the most challenging activity for patient; 12/22/21: Pt demonstrates good maintenance with stairs. She ascends/descends the stairs at home as often as she can without using her chair stairlift; 04/01/22: Pt demonstrates good maintenance with stairs. She ascends/descends the stairs at home as often as she can without using her chair stairlift, pt also performs stairs in the community; 06/21/22: Pt and family reporting increased difficulty and fatigue when performing stairs at home. Pt reports that she is having more DOE; 07/27/22: Pt and family reporting more difficulty and fatigue when performing stairs at home as well as worsening DOE; 10/28/22: Pt reports she continues have difficulty with steps due to balance and strength deficits   Time 12    Period Weeks    Status On-going    Target Date 03/10/2023      PT LONG TERM GOAL #4   Title Patient will complete a TUG test in no more than 13.5 seconds for independent mobility and decreased fall risk maintenance    Baseline 12/11/20: 13.4s; 03/02/21: 13.5s; 06/11/21: 11.4s; 08/20/21: 12.35s; 11/12/21: 12.2s; 12/21/21: 12.0s; 04/01/22: 11.3s; 06/21/22: 13.4s; 07/27/22: 13.3s; 10/28/22: 13.4s   Time 12    Period Weeks    Status On-going    Target Date 03/10/2023      PT LONG TERM GOAL #5   Title Patient will increase fastest 10 meter walk test to >0.81 m/s as to improve gait  speed for better community ambulation and to reduce fall risk.    Baseline 12/11/20: 14.6s = 0.68 m/s; 03/02/21: 14.3s = 0.70 m/s; 06/11/21: 13.2s = 0.76 m/s; 08/20/21: 13.5s= 0.74 m/s; 11/12/21: 14.4s = 0.69 m/s; 12/21/21: 14.1s = 0.71 m/s; 04/01/22: 13.7s = 0.73 m/s; 06/21/22: 14.1s = 0.71 m/s; 07/27/22: 14.0s =  0.71 m/s; 10/28/22: 14.4s =  0.69 m/s   Time 12    Period Weeks  Status Partially Met    Target Date 03/10/2023              Plan     Clinical Impression Statement Pt demonstrates excellent motivation during session today. Updated outcome measures and goals with patient during 10/28/22 visit. Her TUG remained essentially unchanged (13.4s) since it was last updated in September. This is still worse compared to when her therapy frequency was decreased. However her BERG has improved to 33/56 which is quite significant and is closer to where she was before her frequency was decreased (35/56 on 04/01/22). Her 5TSTS remained unchanged compared to her last update however he 35m gait speed was slightly slower. Patient's FOTO score improved to 50 indicating slight improvement in self-perceived function. Her Two Minute Walk Test improved from last update by 25' indicating slight improvement in her endurance. Overall pt showed improvement in outcome measures since increasing her frequency which is encouraging. Patient's father does notice some subjective progressive weakness at home. Discharge attempted at the end of 2021 and pt demonstrated a drop in her function during her prolonged absence out of therapy despite good consistency with her HEP. Frequency was also decreased from every other week to once per month in an attempt to maintain as much as possible and unfortunately this resulted in an unacceptable decline in patient's function. Plan is to maintain frequency at every other week throughout 2024 in order to maintain her function. Pt is regularly exercising at home with the assistance of family as  needed/appropriate. She is unable to complete all necessary exercises safely at home either independently or with family assistance due to need from physical therapist to monitor and correct form and technique as well as to provide safe guarding. Provided continued reinforcement regarding the importance of consistency with HEP and therapy sessions will focus on appropriate modifications to her HEP, family education, and maintenance of her function. Therapist will reassess patient's outcome measures at regular intervals to assess need for continued maintenance therapy frequency. Worked on both strengthening and balance exercises during session today including dual motor tasks and dynamic reaching outside of her base of support. Pt requires physical therapy intervention to maintain her functional status including LE strength, balance, and gait speed/quality and she has not achieved maximal benefit from therapy services. She will benefit from continued PT services to address these deficits and maintain her function at home and school.   Personal Factors and Comorbidities Comorbidity 1;Past/Current Experience;Time since onset of injury/illness/exacerbation;Transportation;Social Background    Comorbidities spina bifida    Examination-Activity Limitations Bend;Carry;Continence;Lift;Reach Overhead;Squat;Stairs;Stand;Transfers    Examination-Participation Restrictions Church;Community Activity;Driving;Laundry;Meal Prep;Volunteer;Yard Work    Stability/Clinical Decision Making Stable/Uncomplicated    Rehab Potential Good    Clinical Impairments Affecting Rehab Potential weakness and decreased standing balance    PT Frequency 1 visit every other week   PT Duration 12 weeks    PT Treatment/Interventions ADLs/Self Care Home Management;Aquatic Therapy;Biofeedback; Moist Heat;Electrical Stimulation;DME Instruction;Gait training;Stair training;Functional mobility training;Neuromuscular re-education;Balance  training;Therapeutic exercise;Therapeutic activities;Patient/family education;Orthotic Fit/Training;;Manual techniques;Passive range of motion;Energy conservation;Splinting;Taping;Visual/perceptual remediation/compensation;Cryotherapy    PT Next Visit Plan review and modify HEP as appropriate, balance and strength exercises    PT Home Exercise Plan Seated, supine, and core program: [email or web address]    Consulted and Agree with Plan of Care Patient             Phillips Grout PT, DPT, [facility] Keisy Strickler, PT 12/19/2022, 2:44 PM

## 2022-12-28 ENCOUNTER — Ambulatory Visit: Payer: Medicaid Other

## 2022-12-28 DIAGNOSIS — M6281 Muscle weakness (generalized): Secondary | ICD-10-CM | POA: Diagnosis not present

## 2022-12-28 DIAGNOSIS — R2681 Unsteadiness on feet: Secondary | ICD-10-CM

## 2022-12-28 NOTE — Therapy (Signed)
OUTPATIENT PHYSICAL THERAPY TREATMENT NOTE  Patient Name: Michelle Randall MRN: [id number] DOB:[birth year], 27 y.o., female Today's Date: 12/30/2022  PCP: Wayland Denis, PA-C REFERRING PROVIDER: Wayland Denis, PA-C   PT End of Session - 12/30/22 0811     Visit Number 52    Number of Visits 56    Date for PT Re-Evaluation 03/10/23    Authorization Type eval: [id number], recert: [id number], [id number], 08/20/21, 11/11/21, 02/04/22, 04/02/22, 06/21/22;    Authorization Time Period [employer] auth 2/13-5/6 for 6 PT visits, Medicaid  VL: 27 combined PT/OT/SLP per year  Auth: is required    Authorization - Visit Number 1    Authorization - Number of Visits 6    PT Start Time 1455    PT Stop Time 1545    PT Time Calculation (min) 50 min    Equipment Utilized During Treatment Gait belt   Bilateral AFOs, lofstrand crutches   Activity Tolerance Patient tolerated treatment well    Behavior During Therapy WFL for tasks assessed/performed            Past Medical History:  Diagnosis Date   Neurogenic bladder    Neurogenic bowel    S/P VP shunt    Spina bifida ([place])    Past Surgical History:  Procedure Laterality Date   VENTRICULOPERITONEAL SHUNT     There are no problems to display for this patient.   REFERRING DIAG: R26.81 (ICD-10-CM) - Unsteadiness on feet  THERAPY DIAG: Muscle weakness (generalized)  Unsteadiness on feet  PERTINENT HISTORY: Patient is a pleasant 27 year old female who presents for continuation of care for generalized weakness/ coordination deficits secondary to diagnosis of spina bifida. PMH includes VP shunt, spina bifida, and neurogenic bowel/bladder. Patient was discharged from this clinic at the end of 2021 due to meeting therapy cap for insurance that year. Patient reports that she has continued performing her HEP but notes a decline when she stops therapy due to inability to safely perform all of the balance and strength exercises as at home. One of the biggest  challenge she is experiencing is stair navigation She reports having multiple near falls when negotiating stairs at home. Patient must ascend/descend stairs to reach bedroom upstairs increasing her risk for falls daily.   PRECAUTIONS: Fall risk OUTCOME MEASURES:   TEST 12/11/20 03/02/21 06/11/21 08/20/21 11/12/21 12/21/21 04/01/22 06/21/22 07/27/22 10/28/22 Interpretation  5 times sit<>stand 10.6s 9.6s 8.1s Not Tested 7.4s 8.2s 8.3s 8.6s 7.7s 7.7s WNL  10 meter walk test 14.6s = 0.68 m/s 14.3s = 0.70 m/s 13.2s =  0.76 m/s 13.5s=0.74 m/s 14.4s = 0.69 m/s 14.1s = 0.71 m/s 13.7s = 0.73 m/s 14.1s = 0.71 m/s 14.0s =  0.71 m/s 14.4s =  0.69 m/s <1.0 m/s indicates increased risk for falls; limited community ambulator  Timed up and Go 13.4s 13.5s 11.4s 12.35s 12.2s 12.0s 11.3s 13.4s 13.3s 13.4s WNL, use of lofstrand crutches, >14 sec indicates increased risk for falls  2 minute walk test [employer] Def[employer] 264' with lofstrand crutches (post: HR: 150, 99%, BORG: 5/10) 233' with lofstrand crutches (post: HR: 145, 97%, BORG: 5/10) 258' with lofstrand crutches (post: BP: 123/74, HR: 130, 100%, BORG: 6/10) 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 No changes since the last therapy session. No falls and denies pain upon arrival. All of her equipment at home is working well.    PAIN:  Are you having pain? No   TREATMENT       Neuromuscular Re-education  NuStep L1-3 BLE only x 5 minutes for warm-up during interval history intake; All balance exercises performed in // bars without UE support unless otherwise specified; Forward/retro gait  in // bars with single LUE support x multiple laps; Forward/retro gait in // bars with single LUE support holding a cone in the contralateral hand x multiple laps; Sidestepping in // bars with single LUE support holding a cone in the contralateral hand x multiple laps; Feet apart dynamic reaching hand taps with therapist adjusting height and distance from patient as well as challenging pt to cross midline x multiple bouts on each side; Feet apart balloon taps with therapist with second assist guarding x multiple bouts; Feet together dynamic forward reaching in multiple planes and switching UEs; Reinforced continued performance of HEP;   Not performed: Feet apart 1# dumbbell (DB) alternating shoulder forward flexion x 10 BUE; Feet apart 1# DB alternating shoulder abduction x 10 BUE; Feet apart cone stacking alternating UE and alternating ipsilateral vs contralateral reaching x multiple bouts with each UE; BOSU (round side up) step-ups alternating leading LE with BUE support x 10 on each side; Staggered stance static balance alternating forward LE x 60s each; Feet apart static balance eyes open/closed while holding a cone balancing a ball on top x 60s each; Feet apart balance with horizontal and vertical head turns x 60s each; Airex staggered balance with front foot on 6" step alternating forward LE x 60s each; Total Gym (TG) Level 22 (L22) double leg squats x 10; TG L22 single leg squats 2 x 10 BLE; TG L22 double leg jumps x 10; Mini squats with BUE support x 10; Sit to stand from regular height chair without UE support 2 x 10; Semitandem balance x 45s with each foot forward; Airex feet apart basketball tosses with pt picking up the ball from the floor and shooting x multiple bouts; Seated LAQ with manual resistance from therapist x 10 BLE; 6" forward step ups alternating leading LE with BUE support x 10 on each side;    PATIENT EDUCATION: Education details: Pt educated throughout  session about proper posture and technique with exercises. Improved exercise technique, movement at target joints, use of target muscles after min to mod verbal, visual, tactile cues. Reinforced importance of continued HEP Person educated: Patient Education method: Customer service manager Education comprehension: verbalized understanding and returned demonstration   HOME EXERCISE PROGRAM: Seated, supine, and core program: Medbridge Access Code: [email or web address]        PT Short Term Goals       PT SHORT TERM GOAL #1   Title Patient will report compliance with HEP for continued strengthening and stability during functional mobility.     Time 6    Period Weeks    Status Achieved              PT Long Term Goals      PT LONG TERM GOAL #1   Title Patient will increase her FOTO score to at least 59 to demonstrate increased function and mobility for higher quality of life.  Baseline 12/11/20: 51; 03/02/21: 49; 06/11/21: 53; 08/20/21: 53; 11/12/21: 49; 12/21/21: 50/ 04/01/22: 45; 06/21/22: 47; 07/27/22: 45; 10/28/22: 50   Time 12    Period Weeks    Status Partially Met    Target Date 03/10/2023      PT LONG TERM GOAL #2   Title Patient will maintain Berg Balance score of at least 32/56 in order to demonstrate decreased fall risk during functional activities.    Baseline 12/11/20: 32/56; 03/02/21: 32/56; 06/11/21: 33/56; 08/20/2021: 34/56; 11/12/21: 34/56; 12/22/21: 34/56; 04/01/22: 35/56; 06/21/22: 31/56; 07/27/22: 29/56; 10/28/22: 33/56   Time 12    Period Weeks    Status On-going    Target Date 03/10/2023      PT LONG TERM GOAL #3   Title Patient will be able to maintain safe performance with ascending/descending a flight of stairs using lofstrand crutches and railing in order to allow for safe navigation of her home environment.    Baseline 12/11/20: currently able to use lofstrands and/or railing with supervision by family; 03/02/21: Good maintenance observed; 06/11/21: Good maintenance observed;  08/20/2021: Good maintenance observed; 11/13/21: Good maintenance observed, family reports it continues to be the most challenging activity for patient; 12/22/21: Pt demonstrates good maintenance with stairs. She ascends/descends the stairs at home as often as she can without using her chair stairlift; 04/01/22: Pt demonstrates good maintenance with stairs. She ascends/descends the stairs at home as often as she can without using her chair stairlift, pt also performs stairs in the community; 06/21/22: Pt and family reporting increased difficulty and fatigue when performing stairs at home. Pt reports that she is having more DOE; 07/27/22: Pt and family reporting more difficulty and fatigue when performing stairs at home as well as worsening DOE; 10/28/22: Pt reports she continues have difficulty with steps due to balance and strength deficits   Time 12    Period Weeks    Status On-going    Target Date 03/10/2023      PT LONG TERM GOAL #4   Title Patient will complete a TUG test in no more than 13.5 seconds for independent mobility and decreased fall risk maintenance    Baseline 12/11/20: 13.4s; 03/02/21: 13.5s; 06/11/21: 11.4s; 08/20/21: 12.35s; 11/12/21: 12.2s; 12/21/21: 12.0s; 04/01/22: 11.3s; 06/21/22: 13.4s; 07/27/22: 13.3s; 10/28/22: 13.4s   Time 12    Period Weeks    Status On-going    Target Date 03/10/2023      PT LONG TERM GOAL #5   Title Patient will increase fastest 10 meter walk test to >0.81 m/s as to improve gait speed for better community ambulation and to reduce fall risk.    Baseline 12/11/20: 14.6s = 0.68 m/s; 03/02/21: 14.3s = 0.70 m/s; 06/11/21: 13.2s = 0.76 m/s; 08/20/21: 13.5s= 0.74 m/s; 11/12/21: 14.4s = 0.69 m/s; 12/21/21: 14.1s = 0.71 m/s; 04/01/22: 13.7s = 0.73 m/s; 06/21/22: 14.1s = 0.71 m/s; 07/27/22: 14.0s =  0.71 m/s; 10/28/22: 14.4s =  0.69 m/s   Time 12    Period Weeks    Status Partially Met    Target Date 03/10/2023              Plan     Clinical Impression Statement Pt demonstrates  excellent motivation during session today and is able to participate in all therapeutic interventions during today's session as instructed. Continues to report LE weakness and notes continued difficulty with stairs at home. Displays decreased endurance with prolonged activity improved with intermittent seated rest breaks. Discharge attempted at the end  of 2021 and pt demonstrated a drop in her function during her prolonged absence out of therapy despite good consistency with her HEP. Frequency was also decreased from every other week to once per month in an attempt to maintain as much as possible and unfortunately this resulted in an unacceptable decline in patient's function. Plan is to maintain frequency at every other week throughout 2024 in order to maintain her function. Pt is regularly exercising at home with the assistance of family as needed/appropriate. She is unable to complete all necessary exercises safely at home either independently or with family assistance due to need from physical therapist to monitor and correct form and technique as well as to provide safe guarding. Provided continued reinforcement regarding the importance of consistency with HEP and therapy sessions will focus on appropriate modifications to her HEP, family education, and maintenance of her function. Therapist will reassess patient's outcome measures at regular intervals to assess need for continued maintenance therapy frequency. Pt requires physical therapy intervention to maintain her functional status including LE strength, balance, and gait speed/quality and she has not achieved maximal benefit from therapy services. She will benefit from continued PT services to address these deficits and maintain her function at home and school.    Personal Factors and Comorbidities Comorbidity 1;Past/Current Experience;Time since onset of injury/illness/exacerbation;Transportation;Social Background    Comorbidities spina bifida     Examination-Activity Limitations Bend;Carry;Continence;Lift;Reach Overhead;Squat;Stairs;Stand;Transfers    Examination-Participation Restrictions Church;Community Activity;Driving;Laundry;Meal Prep;Volunteer;Yard Work    Stability/Clinical Decision Making Stable/Uncomplicated    Rehab Potential Good    Clinical Impairments Affecting Rehab Potential weakness and decreased standing balance    PT Frequency 1 visit every other week   PT Duration 12 weeks    PT Treatment/Interventions ADLs/Self Care Home Management;Aquatic Therapy;Biofeedback; Moist Heat;Electrical Stimulation;DME Instruction;Gait training;Stair training;Functional mobility training;Neuromuscular re-education;Balance training;Therapeutic exercise;Therapeutic activities;Patient/family education;Orthotic Fit/Training;;Manual techniques;Passive range of motion;Energy conservation;Splinting;Taping;Visual/perceptual remediation/compensation;Cryotherapy    PT Next Visit Plan review and modify HEP as appropriate, balance and strength exercises    PT Home Exercise Plan Seated, supine, and core program: [email or web address]    Consulted and Agree with Plan of Care Patient             Phillips Grout PT, DPT, GCS Nayah Lukens, PT 12/30/2022, 8:20 AM

## 2023-01-11 ENCOUNTER — Ambulatory Visit: Payer: Medicaid Other

## 2023-01-11 DIAGNOSIS — M6281 Muscle weakness (generalized): Secondary | ICD-10-CM | POA: Diagnosis not present

## 2023-01-11 DIAGNOSIS — R2681 Unsteadiness on feet: Secondary | ICD-10-CM

## 2023-01-11 NOTE — Therapy (Signed)
OUTPATIENT PHYSICAL THERAPY TREATMENT NOTE  Patient Name: Michelle Randall MRN: [id number] DOB:[birth year], 27 y.o., female Today's Date: 01/12/2023  PCP: Wayland Denis, PA-C REFERRING PROVIDER: Wayland Denis, PA-C   PT End of Session - 01/11/23 1640     Visit Number 53    Number of Visits 27    Date for PT Re-Evaluation 03/10/23    Authorization Type eval: [id number], recert: [id number], [id number], 08/20/21, 11/11/21, 02/04/22, 04/02/22, 06/21/22;    Authorization Time Period [employer] auth 2/13-5/6 for 6 PT visits, Medicaid  VL: 27 combined PT/OT/SLP per year  Auth: is required    Authorization - Visit Number 2    Authorization - Number of Visits 6    PT Start Time [id number]    PT Stop Time 1615    PT Time Calculation (min) 45 min    Equipment Utilized During Treatment Gait belt   Bilateral AFOs, lofstrand crutches   Activity Tolerance Patient tolerated treatment well    Behavior During Therapy WFL for tasks assessed/performed            Past Medical History:  Diagnosis Date   Neurogenic bladder    Neurogenic bowel    S/P VP shunt    Spina bifida ([place])    Past Surgical History:  Procedure Laterality Date   VENTRICULOPERITONEAL SHUNT     There are no problems to display for this patient.   REFERRING DIAG: R26.81 (ICD-10-CM) - Unsteadiness on feet  THERAPY DIAG: Muscle weakness (generalized)  Unsteadiness on feet  PERTINENT HISTORY: Patient is a pleasant 27 year old female who presents for continuation of care for generalized weakness/ coordination deficits secondary to diagnosis of spina bifida. PMH includes VP shunt, spina bifida, and neurogenic bowel/bladder. Patient was discharged from this clinic at the end of 2021 due to meeting therapy cap for insurance that year. Patient reports that she has continued performing her HEP but notes a decline when she stops therapy due to inability to safely perform all of the balance and strength exercises as at home. One of the biggest  challenge she is experiencing is stair navigation She reports having multiple near falls when negotiating stairs at home. Patient must ascend/descend stairs to reach bedroom upstairs increasing her risk for falls daily.   PRECAUTIONS: Fall risk OUTCOME MEASURES:   TEST 12/11/20 03/02/21 06/11/21 08/20/21 11/12/21 12/21/21 04/01/22 06/21/22 07/27/22 10/28/22 Interpretation  5 times sit<>stand 10.6s 9.6s 8.1s Not Tested 7.4s 8.2s 8.3s 8.6s 7.7s 7.7s WNL  10 meter walk test 14.6s = 0.68 m/s 14.3s = 0.70 m/s 13.2s =  0.76 m/s 13.5s=0.74 m/s 14.4s = 0.69 m/s 14.1s = 0.71 m/s 13.7s = 0.73 m/s 14.1s = 0.71 m/s 14.0s =  0.71 m/s 14.4s =  0.69 m/s <1.0 m/s indicates increased risk for falls; limited community ambulator  Timed up and Go 13.4s 13.5s 11.4s 12.35s 12.2s 12.0s 11.3s 13.4s 13.3s 13.4s WNL, use of lofstrand crutches, >14 sec indicates increased risk for falls  2 minute walk test [employer] Deferred [employer] 264' with lofstrand crutches (post: HR: 150, 99%, BORG: 5/10) 233' with lofstrand crutches (post: HR: 145, 97%, BORG: 5/10) 258' with lofstrand crutches (post: BP: 123/74, HR: 130, 100%, BORG: 6/10) 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 No changes since the last therapy session. No falls and denies pain upon arrival. No specific questions or concerns.   PAIN:  Are you having pain? No   TREATMENT       Neuromuscular Re-education  All balance exercises performed in // bars without UE support unless otherwise specified; Feet apart dynamic cone passes with therapist adjusting height and distance from patient as well as  challenging pt to cross midline x multiple bouts on each side; Feet apart dynamic forward reaching in multiple planes and switching UEs; Feet apart picking up and placing cone x multiple bouts, challenged pt to place cones in different locations and to cross midline for trunk rotation; Reinforced continued performance of HEP;   Ther-ex  NuStep L1-3 BLE only x 7 minutes for warm-up and BLE strengthening during interval history intake; Nautilus resisted gait with 10# forward, backward, R lateral, and L lateral x 3 each direction, pt using Lofstrand crutches;; Sit to stand from regular height chair without UE support 2 x 10;   Not performed: Feet apart 1# dumbbell (DB) alternating shoulder forward flexion x 10 BUE; Feet apart 1# DB alternating shoulder abduction x 10 BUE; Feet apart cone stacking alternating UE and alternating ipsilateral vs contralateral reaching x multiple bouts with each UE; BOSU (round side up) step-ups alternating leading LE with BUE support x 10 on each side; Staggered stance static balance alternating forward LE x 60s each; Feet apart static balance eyes open/closed while holding a cone balancing a ball on top x 60s each; Feet apart balance with horizontal and vertical head turns x 60s each; Airex staggered balance with front foot on 6" step alternating forward LE x 60s each; Total Gym (TG) Level 22 (L22) double leg squats x 10; TG L22 single leg squats 2 x 10 BLE; TG L22 double leg jumps x 10; Mini squats with BUE support x 10; Semitandem balance x 45s with each foot forward; Airex feet apart basketball tosses with pt picking up the ball from the floor and shooting x multiple bouts; Seated LAQ with manual resistance from therapist x 10 BLE; 6" forward step ups alternating leading LE with BUE support x 10 on each side; Forward/retro gait in // bars with single LUE support x multiple laps; Forward/retro gait in // bars with single LUE support holding a cone in the  contralateral hand x multiple laps; Sidestepping in // bars with single LUE support holding a cone in the contralateral hand x multiple laps;    PATIENT EDUCATION: Education details: Pt educated throughout session about proper posture and technique with exercises. Improved exercise technique, movement at target joints, use of target muscles after min to mod verbal, visual, tactile cues. Reinforced importance of continued HEP Person educated: Patient Education method: Customer service manager Education comprehension: verbalized understanding and returned demonstration   HOME EXERCISE PROGRAM: Seated, supine, and core program: Medbridge Access Code: [email or web address]        PT Short Term Goals       PT SHORT TERM GOAL #1   Title Patient will report compliance with HEP for continued strengthening and stability during functional mobility.     Time 6    Period Weeks    Status Achieved              PT Long Term Goals  PT LONG TERM GOAL #1   Title Patient will increase her FOTO score to at least 59 to demonstrate increased function and mobility for higher quality of life.    Baseline 12/11/20: 51; 03/02/21: 49; 06/11/21: 53; 08/20/21: 53; 11/12/21: 49; 12/21/21: 50/ 04/01/22: 45; 06/21/22: 47; 07/27/22: 45; 10/28/22: 50   Time 12    Period Weeks    Status Partially Met    Target Date 03/10/2023      PT LONG TERM GOAL #2   Title Patient will maintain Berg Balance score of at least 32/56 in order to demonstrate decreased fall risk during functional activities.    Baseline 12/11/20: 32/56; 03/02/21: 32/56; 06/11/21: 33/56; 08/20/2021: 34/56; 11/12/21: 34/56; 12/22/21: 34/56; 04/01/22: 35/56; 06/21/22: 31/56; 07/27/22: 29/56; 10/28/22: 33/56   Time 12    Period Weeks    Status On-going    Target Date 03/10/2023      PT LONG TERM GOAL #3   Title Patient will be able to maintain safe performance with ascending/descending a flight of stairs using lofstrand crutches and railing in order to allow for  safe navigation of her home environment.    Baseline 12/11/20: currently able to use lofstrands and/or railing with supervision by family; 03/02/21: Good maintenance observed; 06/11/21: Good maintenance observed; 08/20/2021: Good maintenance observed; 11/13/21: Good maintenance observed, family reports it continues to be the most challenging activity for patient; 12/22/21: Pt demonstrates good maintenance with stairs. She ascends/descends the stairs at home as often as she can without using her chair stairlift; 04/01/22: Pt demonstrates good maintenance with stairs. She ascends/descends the stairs at home as often as she can without using her chair stairlift, pt also performs stairs in the community; 06/21/22: Pt and family reporting increased difficulty and fatigue when performing stairs at home. Pt reports that she is having more DOE; 07/27/22: Pt and family reporting more difficulty and fatigue when performing stairs at home as well as worsening DOE; 10/28/22: Pt reports she continues have difficulty with steps due to balance and strength deficits   Time 12    Period Weeks    Status On-going    Target Date 03/10/2023      PT LONG TERM GOAL #4   Title Patient will complete a TUG test in no more than 13.5 seconds for independent mobility and decreased fall risk maintenance    Baseline 12/11/20: 13.4s; 03/02/21: 13.5s; 06/11/21: 11.4s; 08/20/21: 12.35s; 11/12/21: 12.2s; 12/21/21: 12.0s; 04/01/22: 11.3s; 06/21/22: 13.4s; 07/27/22: 13.3s; 10/28/22: 13.4s   Time 12    Period Weeks    Status On-going    Target Date 03/10/2023      PT LONG TERM GOAL #5   Title Patient will increase fastest 10 meter walk test to >0.81 m/s as to improve gait speed for better community ambulation and to reduce fall risk.    Baseline 12/11/20: 14.6s = 0.68 m/s; 03/02/21: 14.3s = 0.70 m/s; 06/11/21: 13.2s = 0.76 m/s; 08/20/21: 13.5s= 0.74 m/s; 11/12/21: 14.4s = 0.69 m/s; 12/21/21: 14.1s = 0.71 m/s; 04/01/22: 13.7s = 0.73 m/s; 06/21/22: 14.1s = 0.71 m/s;  07/27/22: 14.0s =  0.71 m/s; 10/28/22: 14.4s =  0.69 m/s   Time 12    Period Weeks    Status Partially Met    Target Date 03/10/2023              Plan     Clinical Impression Statement Pt demonstrates excellent motivation during session today and is able to participate in all therapeutic interventions during session as  instructed. Her endurance appears to be improving as she requires less seated rest breaks during session. Session focused on both strengthening and balance interventions. Worked on dynamic reaching and bending. Discharge attempted at the end of 2021 and pt demonstrated a drop in her function during her prolonged absence out of therapy despite good consistency with her HEP. Frequency was also decreased from every other week to once per month in an attempt to maintain as much as possible and unfortunately this resulted in an unacceptable decline in patient's function. Plan is to maintain frequency at every other week throughout 2024 in order to maintain her function. Pt is regularly exercising at home with the assistance of family as needed/appropriate. She is unable to complete all necessary exercises safely at home either independently or with family assistance due to need from physical therapist to monitor and correct form and technique as well as to provide safe guarding. Provided continued reinforcement regarding the importance of consistency with HEP and therapy sessions will focus on appropriate modifications to her HEP, family education, and maintenance of her function. Therapist will reassess patient's outcome measures at regular intervals to assess need for continued maintenance therapy frequency. Pt requires physical therapy intervention to maintain her functional status including LE strength, balance, and gait speed/quality and she has not achieved maximal benefit from therapy services. She will benefit from continued PT services to address these deficits and maintain her  function at home and school.    Personal Factors and Comorbidities Comorbidity 1;Past/Current Experience;Time since onset of injury/illness/exacerbation;Transportation;Social Background    Comorbidities spina bifida    Examination-Activity Limitations Bend;Carry;Continence;Lift;Reach Overhead;Squat;Stairs;Stand;Transfers    Examination-Participation Restrictions Church;Community Activity;Driving;Laundry;Meal Prep;Volunteer;Yard Work    Stability/Clinical Decision Making Stable/Uncomplicated    Rehab Potential Good    Clinical Impairments Affecting Rehab Potential weakness and decreased standing balance    PT Frequency 1 visit every other week   PT Duration 12 weeks    PT Treatment/Interventions ADLs/Self Care Home Management;Aquatic Therapy;Biofeedback; Moist Heat;Electrical Stimulation;DME Instruction;Gait training;Stair training;Functional mobility training;Neuromuscular re-education;Balance training;Therapeutic exercise;Therapeutic activities;Patient/family education;Orthotic Fit/Training;;Manual techniques;Passive range of motion;Energy conservation;Splinting;Taping;Visual/perceptual remediation/compensation;Cryotherapy    PT Next Visit Plan review and modify HEP as appropriate, balance and strength exercises    PT Home Exercise Plan Seated, supine, and core program: [email or web address]    Consulted and Agree with Plan of Care Patient             Phillips Grout PT, DPT, [facility] Bear Osten, PT 01/12/2023, 12:48 PM

## 2023-01-21 NOTE — Therapy (Signed)
OUTPATIENT PHYSICAL THERAPY TREATMENT NOTE  Patient Name: Michelle Randall MRN: [id number] DOB:December 13, 1995, 27 y.o., female Today's Date: 01/21/2023  PCP: Wayland Denis, PA-C REFERRING PROVIDER: Wayland Denis, PA-C    Past Medical History:  Diagnosis Date   Neurogenic bladder    Neurogenic bowel    S/P VP shunt    Spina bifida [facility])    Past Surgical History:  Procedure Laterality Date   VENTRICULOPERITONEAL SHUNT     There are no problems to display for this patient.   REFERRING DIAG: R26.81 (ICD-10-CM) - Unsteadiness on feet  THERAPY DIAG: Muscle weakness (generalized)  Unsteadiness on feet  PERTINENT HISTORY: Patient is a pleasant 27 year old female who presents for continuation of care for generalized weakness/ coordination deficits secondary to diagnosis of spina bifida. PMH includes VP shunt, spina bifida, and neurogenic bowel/bladder. Patient was discharged from this clinic at the end of 2021 due to meeting therapy cap for insurance that year. Patient reports that she has continued performing her HEP but notes a decline when she stops therapy due to inability to safely perform all of the balance and strength exercises as at home. One of the biggest challenge she is experiencing is stair navigation She reports having multiple near falls when negotiating stairs at home. Patient must ascend/descend stairs to reach bedroom upstairs increasing her risk for falls daily.   PRECAUTIONS: Fall risk OUTCOME MEASURES:   TEST 12/11/20 03/02/21 06/11/21 08/20/21 11/12/21 12/21/21 04/01/22 06/21/22 07/27/22 10/28/22 Interpretation  5 times sit<>stand 10.6s 9.6s 8.1s Not Tested 7.4s 8.2s 8.3s 8.6s 7.7s 7.7s WNL  10 meter walk test 14.6s = 0.68 m/s 14.3s = 0.70 m/s 13.2s =  0.76 m/s 13.5s=0.74 m/s 14.4s = 0.69 m/s 14.1s = 0.71 m/s 13.7s = 0.73 m/s 14.1s = 0.71 m/s 14.0s =  0.71 m/s 14.4s =  0.69 m/s <1.0 m/s indicates increased risk for falls; limited community ambulator  Timed up and Go 13.4s  13.5s 11.4s 12.35s 12.2s 12.0s 11.3s 13.4s 13.3s 13.4s WNL, use of lofstrand crutches, >14 sec indicates increased risk for falls  2 minute walk test [employer] Def[employer] 264' with lofstrand crutches (post: HR: 150, 99%, BORG: 5/10) 233' with lofstrand crutches (post: HR: 145, 97%, BORG: 5/10) 258' with lofstrand crutches (post: BP: 123/74, HR: 130, 100%, BORG: 6/10) 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 No changes since the last therapy session. No falls and denies pain upon arrival. No specific questions or concerns.   PAIN:  Are you having pain? No   TREATMENT       Neuromuscular Re-education  All balance exercises performed in // bars without UE support unless otherwise specified; Feet apart dynamic cone passes with therapist adjusting height and distance from patient as well as challenging pt to cross midline x multiple bouts on each side; Feet apart dynamic forward reaching in multiple planes and switching UEs; Feet apart picking up and placing cone x multiple bouts, challenged pt to place cones in different locations and to cross midline for trunk rotation; Reinforced continued performance of HEP;  Ther-ex  NuStep L1-3 BLE only x 7 minutes for warm-up and BLE strengthening during interval history intake; Nautilus resisted gait with 10# forward, backward, R lateral, and L lateral x 3 each direction, pt using Lofstrand crutches;; Sit to stand from regular height chair without UE support 2 x 10;   Not performed: Feet apart 1# dumbbell (DB) alternating shoulder forward flexion x 10 BUE; Feet apart 1#  DB alternating shoulder abduction x 10 BUE; Feet apart cone stacking alternating UE and alternating ipsilateral vs contralateral reaching x multiple bouts with each UE; BOSU (round side up) step-ups alternating leading LE with BUE support x 10 on each side; Staggered stance static balance alternating forward LE x 60s each; Feet apart static balance eyes open/closed while holding a cone balancing a ball on top x 60s each; Feet apart balance with horizontal and vertical head turns x 60s each; Airex staggered balance with front foot on 6" step alternating forward LE x 60s each; Total Gym (TG) Level 22 (L22) double leg squats x 10; TG L22 single leg squats 2 x 10 BLE; TG L22 double leg jumps x 10; Mini squats with BUE support x 10; Semitandem balance x 45s with each foot forward; Airex feet apart basketball tosses with pt picking up the ball from the floor and shooting x multiple bouts; Seated LAQ with manual resistance from therapist x 10 BLE; 6" forward step ups alternating leading LE with BUE support x 10 on each side; Forward/retro gait in // bars with single LUE support x multiple laps; Forward/retro gait in // bars with single LUE support holding a cone in the contralateral hand x multiple laps; Sidestepping in // bars with single LUE support holding a cone in the contralateral hand x multiple laps;    PATIENT EDUCATION: Education details: Pt educated throughout session about proper posture and technique with exercises. Improved exercise technique, movement at target joints, use of target muscles after min to mod verbal, visual, tactile cues. Reinforced importance of continued HEP Person educated: Patient Education method: Customer service manager Education comprehension: verbalized understanding and returned demonstration   HOME EXERCISE PROGRAM: Seated, supine, and core program: Medbridge Access Code: [email or web address]        PT Short Term Goals       PT SHORT TERM GOAL #1    Title Patient will report compliance with HEP for continued strengthening and stability during functional mobility.     Time 6    Period Weeks    Status Achieved              PT Long Term Goals      PT LONG TERM GOAL #1   Title Patient will increase her FOTO score to at least 59 to demonstrate increased function and mobility for higher quality of life.    Baseline 12/11/20: 51; 03/02/21: 49; 06/11/21: 53; 08/20/21: 53; 11/12/21: 49; 12/21/21: 50/ 04/01/22: 45; 06/21/22: 47; 07/27/22: 45; 10/28/22: 50   Time 12    Period Weeks    Status Partially Met    Target Date 03/10/2023      PT LONG TERM GOAL #2   Title Patient will maintain Berg Balance score of at least 32/56 in order to demonstrate decreased fall risk during functional activities.    Baseline 12/11/20: 32/56; 03/02/21: 32/56; 06/11/21: 33/56; 10/6/[id number]; 12/29/[id number]; 2/7/[id number]; 04/01/22: 35/56; 06/21/22: 31/56; 07/27/22: 29/56; 10/28/22: 33/56   Time 12    Period Weeks    Status On-going    Target  Date 03/10/2023      PT LONG TERM GOAL #3   Title Patient will be able to maintain safe performance with ascending/descending a flight of stairs using lofstrand crutches and railing in order to allow for safe navigation of her home environment.    Baseline 12/11/20: currently able to use lofstrands and/or railing with supervision by family; 03/02/21: Good maintenance observed; 06/11/21: Good maintenance observed; 08/20/2021: Good maintenance observed; 11/13/21: Good maintenance observed, family reports it continues to be the most challenging activity for patient; 12/22/21: Pt demonstrates good maintenance with stairs. She ascends/descends the stairs at home as often as she can without using her chair stairlift; 04/01/22: Pt demonstrates good maintenance with stairs. She ascends/descends the stairs at home as often as she can without using her chair stairlift, pt also performs stairs in the community; 06/21/22: Pt and family reporting increased  difficulty and fatigue when performing stairs at home. Pt reports that she is having more DOE; 07/27/22: Pt and family reporting more difficulty and fatigue when performing stairs at home as well as worsening DOE; 10/28/22: Pt reports she continues have difficulty with steps due to balance and strength deficits   Time 12    Period Weeks    Status On-going    Target Date 03/10/2023      PT LONG TERM GOAL #4   Title Patient will complete a TUG test in no more than 13.5 seconds for independent mobility and decreased fall risk maintenance    Baseline 12/11/20: 13.4s; 03/02/21: 13.5s; 06/11/21: 11.4s; 08/20/21: 12.35s; 11/12/21: 12.2s; 12/21/21: 12.0s; 04/01/22: 11.3s; 06/21/22: 13.4s; 07/27/22: 13.3s; 10/28/22: 13.4s   Time 12    Period Weeks    Status On-going    Target Date 03/10/2023      PT LONG TERM GOAL #5   Title Patient will increase fastest 10 meter walk test to >0.81 m/s as to improve gait speed for better community ambulation and to reduce fall risk.    Baseline 12/11/20: 14.6s = 0.68 m/s; 03/02/21: 14.3s = 0.70 m/s; 06/11/21: 13.2s = 0.76 m/s; 08/20/21: 13.5s= 0.74 m/s; 11/12/21: 14.4s = 0.69 m/s; 12/21/21: 14.1s = 0.71 m/s; 04/01/22: 13.7s = 0.73 m/s; 06/21/22: 14.1s = 0.71 m/s; 07/27/22: 14.0s =  0.71 m/s; 10/28/22: 14.4s =  0.69 m/s   Time 12    Period Weeks    Status Partially Met    Target Date 03/10/2023              Plan     Clinical Impression Statement Pt demonstrates excellent motivation during session today and is able to participate in all therapeutic interventions during session as instructed. Her endurance appears to be improving as she requires less seated rest breaks during session. Session focused on both strengthening and balance interventions. Worked on dynamic reaching and bending. Discharge attempted at the end of 2021 and pt demonstrated a drop in her function during her prolonged absence out of therapy despite good consistency with her HEP. Frequency was also decreased from every  other week to once per month in an attempt to maintain as much as possible and unfortunately this resulted in an unacceptable decline in patient's function. Plan is to maintain frequency at every other week throughout 2024 in order to maintain her function. Pt is regularly exercising at home with the assistance of family as needed/appropriate. She is unable to complete all necessary exercises safely at home either independently or with family assistance due to need from physical therapist to monitor and correct form and  technique as well as to provide safe guarding. Provided continued reinforcement regarding the importance of consistency with HEP and therapy sessions will focus on appropriate modifications to her HEP, family education, and maintenance of her function. Therapist will reassess patient's outcome measures at regular intervals to assess need for continued maintenance therapy frequency. Pt requires physical therapy intervention to maintain her functional status including LE strength, balance, and gait speed/quality and she has not achieved maximal benefit from therapy services. She will benefit from continued PT services to address these deficits and maintain her function at home and school.    Personal Factors and Comorbidities Comorbidity 1;Past/Current Experience;Time since onset of injury/illness/exacerbation;Transportation;Social Background    Comorbidities spina bifida    Examination-Activity Limitations Bend;Carry;Continence;Lift;Reach Overhead;Squat;Stairs;Stand;Transfers    Examination-Participation Restrictions Church;Community Activity;Driving;Laundry;Meal Prep;Volunteer;Yard Work    Stability/Clinical Decision Making Stable/Uncomplicated    Rehab Potential Good    Clinical Impairments Affecting Rehab Potential weakness and decreased standing balance    PT Frequency 1 visit every other week   PT Duration 12 weeks    PT Treatment/Interventions ADLs/Self Care Home Management;Aquatic  Therapy;Biofeedback; Moist Heat;Electrical Stimulation;DME Instruction;Gait training;Stair training;Functional mobility training;Neuromuscular re-education;Balance training;Therapeutic exercise;Therapeutic activities;Patient/family education;Orthotic Fit/Training;;Manual techniques;Passive range of motion;Energy conservation;Splinting;Taping;Visual/perceptual remediation/compensation;Cryotherapy    PT Next Visit Plan review and modify HEP as appropriate, balance and strength exercises    PT Home Exercise Plan Seated, supine, and core program: [email or web address]    Consulted and Agree with Plan of Care Patient             Phillips Grout PT, DPT, [facility] Sunny Aguon, PT 01/21/2023, 8:48 AM

## 2023-01-25 ENCOUNTER — Ambulatory Visit: Payer: Medicaid Other | Attending: Student

## 2023-01-25 DIAGNOSIS — M6281 Muscle weakness (generalized): Secondary | ICD-10-CM | POA: Diagnosis present

## 2023-01-25 DIAGNOSIS — R2681 Unsteadiness on feet: Secondary | ICD-10-CM | POA: Insufficient documentation

## 2023-02-07 NOTE — Therapy (Unsigned)
OUTPATIENT PHYSICAL THERAPY TREATMENT NOTE  Patient Name: Michelle Randall MRN: [id number] DOB:09/16/1996, 27 y.o., female Today's Date: 02/09/2023  PCP: Wayland Denis, PA-C REFERRING PROVIDER: Wayland Denis, PA-C   PT End of Session - 02/08/23 1541     Visit Number 55    Number of Visits 65    Date for PT Re-Evaluation 03/10/23    Authorization Type eval: [id number], recert: [id number], [id number], 08/20/21, 11/11/21, 02/04/22, 04/02/22, 06/21/22;    Authorization Time Period [employer] auth 2/13-5/6 for 6 PT visits, Medicaid  VL: 27 combined PT/OT/SLP per year  Auth: is required    Authorization - Visit Number 4    Authorization - Number of Visits 6    PT Start Time [id number]    PT Stop Time 1620    PT Time Calculation (min) 45 min    Equipment Utilized During Treatment Gait belt   Bilateral AFOs, lofstrand crutches   Activity Tolerance Patient tolerated treatment well    Behavior During Therapy WFL for tasks assessed/performed            Past Medical History:  Diagnosis Date   Neurogenic bladder    Neurogenic bowel    S/P VP shunt    Spina bifida ([place])    Past Surgical History:  Procedure Laterality Date   VENTRICULOPERITONEAL SHUNT     There are no problems to display for this patient.  REFERRING DIAG: R26.81 (ICD-10-CM) - Unsteadiness on feet  THERAPY DIAG: Muscle weakness (generalized)  Unsteadiness on feet  PERTINENT HISTORY: Patient is a pleasant 27 year old female who presents for continuation of care for generalized weakness/ coordination deficits secondary to diagnosis of spina bifida. PMH includes VP shunt, spina bifida, and neurogenic bowel/bladder. Patient was discharged from this clinic at the end of 2021 due to meeting therapy cap for insurance that year. Patient reports that she has continued performing her HEP but notes a decline when she stops therapy due to inability to safely perform all of the balance and strength exercises as at home. One of the biggest challenge  she is experiencing is stair navigation She reports having multiple near falls when negotiating stairs at home. Patient must ascend/descend stairs to reach bedroom upstairs increasing her risk for falls daily.   PRECAUTIONS: Fall risk OUTCOME MEASURES:   TEST 12/11/20 03/02/21 06/11/21 08/20/21 11/12/21 12/21/21 04/01/22 06/21/22 07/27/22 10/28/22 Interpretation  5 times sit<>stand 10.6s 9.6s 8.1s Not Tested 7.4s 8.2s 8.3s 8.6s 7.7s 7.7s WNL  10 meter walk test 14.6s = 0.68 m/s 14.3s = 0.70 m/s 13.2s =  0.76 m/s 13.5s=0.74 m/s 14.4s = 0.69 m/s 14.1s = 0.71 m/s 13.7s = 0.73 m/s 14.1s = 0.71 m/s 14.0s =  0.71 m/s 14.4s =  0.69 m/s <1.0 m/s indicates increased risk for falls; limited community ambulator  Timed up and Go 13.4s 13.5s 11.4s 12.35s 12.2s 12.0s 11.3s 13.4s 13.3s 13.4s WNL, use of lofstrand crutches, >14 sec indicates increased risk for falls  2 minute walk test [employer] Deferred Def[employer] 264' with lofstrand crutches (post: HR: 150, 99%, BORG: 5/10) 233' with lofstrand crutches (post: HR: 145, 97%, BORG: 5/10) 258' with lofstrand crutches (post: BP: 123/74, HR: 130, 100%, BORG: 6/10) 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 She is still fasting for Ramadan.  No changes since the last therapy session. No falls and denies pain upon arrival. No specific questions or concerns.   PAIN:  Are you having pain? No   TREATMENT       Neuromuscular Re-education  NuStep L1-2 BLE only x 7 minutes for warm-up and BLE strengthening during interval history intake; All balance exercises performed in // bars without UE support unless  otherwise specified; Forward/retro gait in // bars with single LUE support x 1 length each direction; Sidestepping in // bars with single LUE support x 1 length each direction; Staggerred balance with front foot on 6" step alternating forward LE x 30s each; Feet together (FT) static balance x 30s; FT dynamic reaching in multiple planes and alternating UE to challenge forward reach and stability as pt reaches outside her BOS, varying target location/distance, and challenging pt to cross midline and turn trunk; FT horizontal and vertical head turns x 30s each; Airex balance beam side stepping with LUE support x multiple lengths;  Feet apart 1# dumbbell (DB) alternating shoulder forward flexion x 10 BUE; Feet apart 1# DB alternating shoulder overhead press x 10 BUE; Walking in // bars stopping to pickup cones from floor without UE support and passing to therapist; Reinforced continued performance of HEP;   Not performed: BOSU (round side up) step-ups alternating leading LE with BUE support x 10 on each side; Total Gym (TG) Level 22 (L22) double leg squats x 10; TG L22 single leg squats 2 x 10 BLE; TG L22 double leg jumps x 10; Mini squats with BUE support x 10; Seated LAQ with manual resistance from therapist x 10 BLE; 6" forward step ups alternating leading LE with BUE support x 10 on each side; Nautilus resisted gait with 10# forward, backward, R lateral, and L lateral x 3 each direction, pt using Lofstrand crutches; Hooklying marches 2 x 10 BLE; Hooklying SLR x 10 BLE; Hooklying bridges x 10; Hooklying manually resisted rotation 3s hold x 10 toward each direction; Hooklying heel slide with manually resisted extension by therapist 2 x 10 BLE; Sit to stand from mat table without UE support x 10; Airex feet apart dynamic cone passes with therapist adjusting height and distance from patient as well as challenging pt to cross midline x multiple bouts on each side;   PATIENT  EDUCATION: Education details: Pt educated throughout session about proper posture and technique with exercises. Improved exercise technique, movement at target joints, use of target muscles after min to mod verbal, visual, tactile cues. Reinforced importance of continued HEP Person educated: Patient Education method: Customer service manager Education comprehension: verbalized understanding and returned demonstration   HOME EXERCISE PROGRAM: Seated, supine, and core program: Medbridge Access Code: [email or web address]        PT Short Term Goals       PT SHORT TERM GOAL #1   Title Patient will report compliance with HEP for continued strengthening and stability during functional mobility.     Time 6    Period Weeks    Status Achieved              PT Long Term Goals      PT LONG TERM GOAL #1   Title Patient will increase her FOTO score to at least 59 to demonstrate increased function  and mobility for higher quality of life.    Baseline 12/11/20: 51; 03/02/21: 49; 06/11/21: 53; 08/20/21: 53; 11/12/21: 49; 12/21/21: 50/ 04/01/22: 45; 06/21/22: 47; 07/27/22: 45; 10/28/22: 50   Time 12    Period Weeks    Status Partially Met    Target Date 03/10/2023      PT LONG TERM GOAL #2   Title Patient will maintain Berg Balance score of at least 32/56 in order to demonstrate decreased fall risk during functional activities.    Baseline 12/11/20: 32/56; 03/02/21: 32/56; 06/11/21: 33/56; 08/20/2021: 34/56; 11/12/21: 34/56; 12/22/21: 34/56; 04/01/22: 35/56; 06/21/22: 31/56; 07/27/22: 29/56; 10/28/22: 33/56   Time 12    Period Weeks    Status On-going    Target Date 03/10/2023      PT LONG TERM GOAL #3   Title Patient will be able to maintain safe performance with ascending/descending a flight of stairs using lofstrand crutches and railing in order to allow for safe navigation of her home environment.    Baseline 12/11/20: currently able to use lofstrands and/or railing with supervision by family; 03/02/21: Good  maintenance observed; 06/11/21: Good maintenance observed; 08/20/2021: Good maintenance observed; 11/13/21: Good maintenance observed, family reports it continues to be the most challenging activity for patient; 12/22/21: Pt demonstrates good maintenance with stairs. She ascends/descends the stairs at home as often as she can without using her chair stairlift; 04/01/22: Pt demonstrates good maintenance with stairs. She ascends/descends the stairs at home as often as she can without using her chair stairlift, pt also performs stairs in the community; 06/21/22: Pt and family reporting increased difficulty and fatigue when performing stairs at home. Pt reports that she is having more DOE; 07/27/22: Pt and family reporting more difficulty and fatigue when performing stairs at home as well as worsening DOE; 10/28/22: Pt reports she continues have difficulty with steps due to balance and strength deficits   Time 12    Period Weeks    Status On-going    Target Date 03/10/2023      PT LONG TERM GOAL #4   Title Patient will complete a TUG test in no more than 13.5 seconds for independent mobility and decreased fall risk maintenance    Baseline 12/11/20: 13.4s; 03/02/21: 13.5s; 06/11/21: 11.4s; 08/20/21: 12.35s; 11/12/21: 12.2s; 12/21/21: 12.0s; 04/01/22: 11.3s; 06/21/22: 13.4s; 07/27/22: 13.3s; 10/28/22: 13.4s   Time 12    Period Weeks    Status On-going    Target Date 03/10/2023      PT LONG TERM GOAL #5   Title Patient will increase fastest 10 meter walk test to >0.81 m/s as to improve gait speed for better community ambulation and to reduce fall risk.    Baseline 12/11/20: 14.6s = 0.68 m/s; 03/02/21: 14.3s = 0.70 m/s; 06/11/21: 13.2s = 0.76 m/s; 08/20/21: 13.5s= 0.74 m/s; 11/12/21: 14.4s = 0.69 m/s; 12/21/21: 14.1s = 0.71 m/s; 04/01/22: 13.7s = 0.73 m/s; 06/21/22: 14.1s = 0.71 m/s; 07/27/22: 14.0s =  0.71 m/s; 10/28/22: 14.4s =  0.69 m/s   Time 12    Period Weeks    Status Partially Met    Target Date 03/10/2023               Plan     Clinical Impression Statement Pt demonstrates excellent motivation during session today and is able to participate in all therapeutic interventions during session as instructed. Session focused on balance exercises since pt is currently still fasting for Ramadan. Worked on dynamic reaching with patient during session  today and also focused on dynamic challenges during gait. Practiced forward bending to pick-up cones from floor as this is functional for patient at home. Discharge attempted at the end of 2021 and pt demonstrated a drop in her function during her prolonged absence out of therapy despite good consistency with her HEP. Frequency was also decreased from every other week to once per month in an attempt to maintain as much as possible and unfortunately this resulted in an unacceptable decline in patient's function. Plan is to maintain frequency at every other week throughout 2024 in order to maintain her function. Pt is regularly exercising at home with the assistance of family as needed/appropriate. She is unable to complete all necessary exercises safely at home either independently or with family assistance due to need from physical therapist to monitor and correct form and technique as well as to provide safe guarding. Provided continued reinforcement regarding the importance of consistency with HEP and therapy sessions will focus on appropriate modifications to her HEP, family education, and maintenance of her function. Therapist will reassess patient's outcome measures at regular intervals to assess need for continued maintenance therapy frequency. Pt requires physical therapy intervention to maintain her functional status including LE strength, balance, and gait speed/quality and she has not achieved maximal benefit from therapy services. She will benefit from continued PT services to address these deficits and maintain her function at home and school.    Personal Factors and  Comorbidities Comorbidity 1;Past/Current Experience;Time since onset of injury/illness/exacerbation;Transportation;Social Background    Comorbidities spina bifida    Examination-Activity Limitations Bend;Carry;Continence;Lift;Reach Overhead;Squat;Stairs;Stand;Transfers    Examination-Participation Restrictions Church;Community Activity;Driving;Laundry;Meal Prep;Volunteer;Yard Work    Stability/Clinical Decision Making Stable/Uncomplicated    Rehab Potential Good    Clinical Impairments Affecting Rehab Potential weakness and decreased standing balance    PT Frequency 1 visit every other week   PT Duration 12 weeks    PT Treatment/Interventions ADLs/Self Care Home Management;Aquatic Therapy;Biofeedback; Moist Heat;Electrical Stimulation;DME Instruction;Gait training;Stair training;Functional mobility training;Neuromuscular re-education;Balance training;Therapeutic exercise;Therapeutic activities;Patient/family education;Orthotic Fit/Training;;Manual techniques;Passive range of motion;Energy conservation;Splinting;Taping;Visual/perceptual remediation/compensation;Cryotherapy    PT Next Visit Plan review and modify HEP as appropriate, balance and strength exercises    PT Home Exercise Plan Seated, supine, and core program: [email or web address]    Consulted and Agree with Plan of Care Patient             Phillips Grout PT, DPT, [facility] Jeannie Mallinger, PT 02/09/2023, 11:36 AM

## 2023-02-08 ENCOUNTER — Ambulatory Visit: Payer: Medicaid Other

## 2023-02-08 DIAGNOSIS — M6281 Muscle weakness (generalized): Secondary | ICD-10-CM | POA: Diagnosis not present

## 2023-02-08 DIAGNOSIS — R2681 Unsteadiness on feet: Secondary | ICD-10-CM

## 2023-02-22 ENCOUNTER — Ambulatory Visit: Payer: Medicaid Other | Attending: Student

## 2023-02-22 DIAGNOSIS — M6281 Muscle weakness (generalized): Secondary | ICD-10-CM

## 2023-02-22 DIAGNOSIS — R2681 Unsteadiness on feet: Secondary | ICD-10-CM | POA: Diagnosis present

## 2023-02-22 NOTE — Therapy (Signed)
OUTPATIENT PHYSICAL THERAPY TREATMENT NOTE  Patient Name: Michelle Randall MRN: [id number] DOB:1996/06/03, 27 y.o., female Today's Date: 02/22/2023  PCP: Carren Rang, PA-C REFERRING PROVIDER: Carren Rang, PA-C   PT End of Session - 02/22/23 1528     Visit Number 55    Number of Visits 76    Date for PT Re-Evaluation 03/10/23    Authorization Type eval: 12/11/20, recert: 03/02/21, 05/28/21, 08/20/21, 11/11/21, 02/04/22, 04/02/22, 06/21/22;    Authorization Time Period [facility] auth 2/13-5/6 for 6 PT visits, Medicaid  VL: 27 combined PT/OT/SLP per year  Auth: is required    Authorization - Visit Number 5    Authorization - Number of Visits 6    PT Start Time 1525    PT Stop Time 1610    PT Time Calculation (min) 45 min    Equipment Utilized During Treatment Gait belt   Bilateral AFOs, lofstrand crutches   Activity Tolerance Patient tolerated treatment well    Behavior During Therapy WFL for tasks assessed/performed            Past Medical History:  Diagnosis Date   Neurogenic bladder    Neurogenic bowel    S/P VP shunt    Spina bifida    Past Surgical History:  Procedure Laterality Date   VENTRICULOPERITONEAL SHUNT     There are no problems to display for this patient.  REFERRING DIAG: R26.81 (ICD-10-CM) - Unsteadiness on feet  THERAPY DIAG: Muscle weakness (generalized)  Unsteadiness on feet  PERTINENT HISTORY: Patient is a pleasant 27 year old female who presents for continuation of care for generalized weakness/ coordination deficits secondary to diagnosis of spina bifida. PMH includes VP shunt, spina bifida, and neurogenic bowel/bladder. Patient was discharged from this clinic at the end of 2021 due to meeting therapy cap for insurance that year. Patient reports that she has continued performing her HEP but notes a decline when she stops therapy due to inability to safely perform all of the balance and strength exercises as at home. One of the biggest challenge she is  experiencing is stair navigation She reports having multiple near falls when negotiating stairs at home. Patient must ascend/descend stairs to reach bedroom upstairs increasing her risk for falls daily.   PRECAUTIONS: Fall risk OUTCOME MEASURES:   TEST 12/11/20 03/02/21 06/11/21 08/20/21 11/12/21 12/21/21 04/01/22 06/21/22 07/27/22 10/28/22 Interpretation  5 times sit<>stand 10.6s 9.6s 8.1s Not Tested 7.4s 8.2s 8.3s 8.6s 7.7s 7.7s WNL  10 meter walk test 14.6s = 0.68 m/s 14.3s = 0.70 m/s 13.2s =  0.76 m/s 13.5s=0.74 m/s 14.4s = 0.69 m/s 14.1s = 0.71 m/s 13.7s = 0.73 m/s 14.1s = 0.71 m/s 14.0s =  0.71 m/s 14.4s =  0.69 m/s <1.0 m/s indicates increased risk for falls; limited community ambulator  Timed up and Go 13.4s 13.5s 11.4s 12.35s 12.2s 12.0s 11.3s 13.4s 13.3s 13.4s WNL, use of lofstrand crutches, >14 sec indicates increased risk for falls  2 minute walk test [employer] Deferred [employer] 264' with lofstrand crutches (post: HR: 150, 99%, BORG: 5/10) 233' with lofstrand crutches (post: HR: 145, 97%, BORG: 5/10) 258' with lofstrand crutches (post: BP: 123/74, HR: 130, 100%, BORG: 6/10) 1000 feet is Marine scientist Assessment 32/56 32/56 33/56    34/56 34/56 34/56  35/56 31/56 29/56  33/56 <36/56 (100% risk for falls), 37-45 (80% risk for falls); 46-51 (>50% risk for falls); 52-55 (lower risk <25% of falls)  FOTO 51 49 53 53 49 50 45 47  45 50 Goal is to reach 59      TODAY'S TREATMENT:      SUBJECTIVE: Pt reports that she is doing well today. She is still fasting for Ramadan.  No changes since the last therapy session. No falls and denies pain upon arrival. No specific questions or concerns.   PAIN:  Are you having pain? No   TREATMENT       Neuromuscular Re-education  NuStep L1-2 BLE only x 8 minutes for warm-up and BLE strengthening during interval history intake (3 minutes unbilled); All balance exercises performed in // bars without UE support  unless otherwise specified; Forward/retro gait in // bars with single LUE support x 1 length each direction; Sidestepping in // bars with single LUE support x 1 length each direction; Staggerred balance with front foot on 6" step alternating forward LE x 30s each; Feet together (FT) static balance x 30s; FT dynamic reaching in multiple planes and alternating UE to challenge forward reach and stability as pt reaches outside her BOS, varying target location/distance, and challenging pt to cross midline and turn trunk; FT horizontal and vertical head turns x 30s each; Airex balance beam side stepping with LUE support x multiple lengths;  Feet apart 1# dumbbell (DB) alternating shoulder forward flexion x 10 BUE; Feet apart 1# DB alternating shoulder overhead press x 10 BUE; Walking in // bars stopping to pickup cones from floor without UE support and passing to therapist; Reinforced continued performance of HEP;   Not performed: BOSU (round side up) step-ups alternating leading LE with BUE support x 10 on each side; Total Gym (TG) Level 22 (L22) double leg squats x 10; TG L22 single leg squats 2 x 10 BLE; TG L22 double leg jumps x 10; Mini squats with BUE support x 10; Seated LAQ with manual resistance from therapist x 10 BLE; 6" forward step ups alternating leading LE with BUE support x 10 on each side; Nautilus resisted gait with 10# forward, backward, R lateral, and L lateral x 3 each direction, pt using Lofstrand crutches; Hooklying marches 2 x 10 BLE; Hooklying SLR x 10 BLE; Hooklying bridges x 10; Hooklying manually resisted rotation 3s hold x 10 toward each direction; Hooklying heel slide with manually resisted extension by therapist 2 x 10 BLE; Sit to stand from mat table without UE support x 10; Airex feet apart dynamic cone passes with therapist adjusting height and distance from patient as well as challenging pt to cross midline x multiple bouts on each side;   PATIENT  EDUCATION: Education details: Pt educated throughout session about proper posture and technique with exercises. Improved exercise technique, movement at target joints, use of target muscles after min to mod verbal, visual, tactile cues. Reinforced importance of continued HEP Person educated: Patient Education method: Medical illustrator Education comprehension: verbalized understanding and returned demonstration   HOME EXERCISE PROGRAM: Seated, supine, and core program: Medbridge Access Code: [email or web address]        PT Short Term Goals       PT SHORT TERM GOAL #1   Title Patient will report compliance with HEP for continued strengthening and stability during functional mobility.     Time 6    Period Weeks    Status Achieved              PT Long Term Goals      PT LONG TERM GOAL #1   Title Patient will increase her FOTO score to at least 59 to demonstrate  increased function and mobility for higher quality of life.    Baseline 12/11/20: 51; 03/02/21: 49; 06/11/21: 53; 08/20/21: 53; 11/12/21: 49; 12/21/21: 50/ 04/01/22: 45; 06/21/22: 47; 07/27/22: 45; 10/28/22: 50   Time 12    Period Weeks    Status Partially Met    Target Date 03/10/2023      PT LONG TERM GOAL #2   Title Patient will maintain Berg Balance score of at least 32/56 in order to demonstrate decreased fall risk during functional activities.    Baseline 12/11/20: 32/56; 03/02/21: 32/56; 06/11/21: 33/56; 08/20/2021: 34/56; 11/12/21: 34/56; 12/22/21: 34/56; 04/01/22: 35/56; 06/21/22: 31/56; 07/27/22: 29/56; 10/28/22: 33/56   Time 12    Period Weeks    Status On-going    Target Date 03/10/2023      PT LONG TERM GOAL #3   Title Patient will be able to maintain safe performance with ascending/descending a flight of stairs using lofstrand crutches and railing in order to allow for safe navigation of her home environment.    Baseline 12/11/20: currently able to use lofstrands and/or railing with supervision by family; 03/02/21: Good  maintenance observed; 06/11/21: Good maintenance observed; 08/20/2021: Good maintenance observed; 11/13/21: Good maintenance observed, family reports it continues to be the most challenging activity for patient; 12/22/21: Pt demonstrates good maintenance with stairs. She ascends/descends the stairs at home as often as she can without using her chair stairlift; 04/01/22: Pt demonstrates good maintenance with stairs. She ascends/descends the stairs at home as often as she can without using her chair stairlift, pt also performs stairs in the community; 06/21/22: Pt and family reporting increased difficulty and fatigue when performing stairs at home. Pt reports that she is having more DOE; 07/27/22: Pt and family reporting more difficulty and fatigue when performing stairs at home as well as worsening DOE; 10/28/22: Pt reports she continues have difficulty with steps due to balance and strength deficits   Time 12    Period Weeks    Status On-going    Target Date 03/10/2023      PT LONG TERM GOAL #4   Title Patient will complete a TUG test in no more than 13.5 seconds for independent mobility and decreased fall risk maintenance    Baseline 12/11/20: 13.4s; 03/02/21: 13.5s; 06/11/21: 11.4s; 08/20/21: 12.35s; 11/12/21: 12.2s; 12/21/21: 12.0s; 04/01/22: 11.3s; 06/21/22: 13.4s; 07/27/22: 13.3s; 10/28/22: 13.4s   Time 12    Period Weeks    Status On-going    Target Date 03/10/2023      PT LONG TERM GOAL #5   Title Patient will increase fastest 10 meter walk test to >0.81 m/s as to improve gait speed for better community ambulation and to reduce fall risk.    Baseline 12/11/20: 14.6s = 0.68 m/s; 03/02/21: 14.3s = 0.70 m/s; 06/11/21: 13.2s = 0.76 m/s; 08/20/21: 13.5s= 0.74 m/s; 11/12/21: 14.4s = 0.69 m/s; 12/21/21: 14.1s = 0.71 m/s; 04/01/22: 13.7s = 0.73 m/s; 06/21/22: 14.1s = 0.71 m/s; 07/27/22: 14.0s =  0.71 m/s; 10/28/22: 14.4s =  0.69 m/s   Time 12    Period Weeks    Status Partially Met    Target Date 03/10/2023               Plan     Clinical Impression Statement Pt demonstrates excellent motivation during session today and is able to participate in all therapeutic interventions during session as instructed. Session focused on balance exercises since pt is currently still fasting for Ramadan. Worked on dynamic reaching with patient  during session today and also focused on dynamic challenges during gait. Practiced forward bending to pick-up cones from floor as this is functional for patient at home. Discharge attempted at the end of 2021 and pt demonstrated a drop in her function during her prolonged absence out of therapy despite good consistency with her HEP. Frequency was also decreased from every other week to once per month in an attempt to maintain as much as possible and unfortunately this resulted in an unacceptable decline in patient's function. Plan is to maintain frequency at every other week throughout 2024 in order to maintain her function. Pt is regularly exercising at home with the assistance of family as needed/appropriate. She is unable to complete all necessary exercises safely at home either independently or with family assistance due to need from physical therapist to monitor and correct form and technique as well as to provide safe guarding. Provided continued reinforcement regarding the importance of consistency with HEP and therapy sessions will focus on appropriate modifications to her HEP, family education, and maintenance of her function. Therapist will reassess patient's outcome measures at regular intervals to assess need for continued maintenance therapy frequency. Pt requires physical therapy intervention to maintain her functional status including LE strength, balance, and gait speed/quality and she has not achieved maximal benefit from therapy services. She will benefit from continued PT services to address these deficits and maintain her function at home and school.    Personal Factors and  Comorbidities Comorbidity 1;Past/Current Experience;Time since onset of injury/illness/exacerbation;Transportation;Social Background    Comorbidities spina bifida    Examination-Activity Limitations Bend;Carry;Continence;Lift;Reach Overhead;Squat;Stairs;Stand;Transfers    Examination-Participation Restrictions Church;Community Activity;Driving;Laundry;Meal Prep;Volunteer;Yard Work    Stability/Clinical Decision Making Stable/Uncomplicated    Rehab Potential Good    Clinical Impairments Affecting Rehab Potential weakness and decreased standing balance    PT Frequency 1 visit every other week   PT Duration 12 weeks    PT Treatment/Interventions ADLs/Self Care Home Management;Aquatic Therapy;Biofeedback; Moist Heat;Electrical Stimulation;DME Instruction;Gait training;Stair training;Functional mobility training;Neuromuscular re-education;Balance training;Therapeutic exercise;Therapeutic activities;Patient/family education;Orthotic Fit/Training;;Manual techniques;Passive range of motion;Energy conservation;Splinting;Taping;Visual/perceptual remediation/compensation;Cryotherapy    PT Next Visit Plan review and modify HEP as appropriate, balance and strength exercises    PT Home Exercise Plan Seated, supine, and core program: [email or web address]    Consulted and Agree with Plan of Care Patient             Lynnea Maizes PT, DPT, [facility] Cheyanna Strick, PT 02/22/2023, 3:34 PM

## 2023-03-08 ENCOUNTER — Ambulatory Visit: Payer: Medicaid Other

## 2023-03-08 DIAGNOSIS — M6281 Muscle weakness (generalized): Secondary | ICD-10-CM

## 2023-03-08 DIAGNOSIS — R2681 Unsteadiness on feet: Secondary | ICD-10-CM

## 2023-03-08 NOTE — Therapy (Signed)
OUTPATIENT PHYSICAL THERAPY TREATMENT NOTE/RECERTIFICATION  Patient Name: Michelle Randall MRN: [id number] DOB:12/20/1995, 27 y.o., female Today's Date: 03/09/2023  PCP: Carren Rang, PA-C REFERRING PROVIDER: Carren Rang, PA-C   PT End of Session - 03/08/23 1542     Visit Number 56    Number of Visits 76    Date for PT Re-Evaluation 05/31/23    Authorization Type eval: 12/11/20, recert: 03/02/21, 05/28/21, 08/20/21, 11/11/21, 02/04/22, 04/02/22, 06/21/22;    Authorization Time Period [employer] auth 2/13-5/6 for 6 PT visits, Medicaid  VL: 27 combined PT/OT/SLP per year  Auth: is required    Authorization - Visit Number 6    Authorization - Number of Visits 6    PT Start Time 1531    PT Stop Time 1615    PT Time Calculation (min) 44 min    Equipment Utilized During Treatment Gait belt   Bilateral AFOs, lofstrand crutches   Activity Tolerance Patient tolerated treatment well    Behavior During Therapy WFL for tasks assessed/performed            Past Medical History:  Diagnosis Date   Neurogenic bladder    Neurogenic bowel    S/P VP shunt    Spina bifida    Past Surgical History:  Procedure Laterality Date   VENTRICULOPERITONEAL SHUNT     There are no problems to display for this patient.  REFERRING DIAG: R26.81 (ICD-10-CM) - Unsteadiness on feet  THERAPY DIAG: Muscle weakness (generalized)  Unsteadiness on feet  PERTINENT HISTORY: Patient is a pleasant 27 year old female who presents for continuation of care for generalized weakness/ coordination deficits secondary to diagnosis of spina bifida. PMH includes VP shunt, spina bifida, and neurogenic bowel/bladder. Patient was discharged from this clinic at the end of 2021 due to meeting therapy cap for insurance that year. Patient reports that she has continued performing her HEP but notes a decline when she stops therapy due to inability to safely perform all of the balance and strength exercises as at home. One of the biggest  challenge she is experiencing is stair navigation She reports having multiple near falls when negotiating stairs at home. Patient must ascend/descend stairs to reach bedroom upstairs increasing her risk for falls daily.   PRECAUTIONS: Fall risk OUTCOME MEASURES:   TEST 12/11/20 03/02/21 06/11/21 08/20/21 11/12/21 12/21/21 04/01/22 06/21/22 07/27/22 10/28/22 Interpretation  5 times sit<>stand 10.6s 9.6s 8.1s Not Tested 7.4s 8.2s 8.3s 8.6s 7.7s 7.7s WNL  10 meter walk test 14.6s = 0.68 m/s 14.3s = 0.70 m/s 13.2s =  0.76 m/s 13.5s=0.74 m/s 14.4s = 0.69 m/s 14.1s = 0.71 m/s 13.7s = 0.73 m/s 14.1s = 0.71 m/s 14.0s =  0.71 m/s 14.4s =  0.69 m/s <1.0 m/s indicates increased risk for falls; limited community ambulator  Timed up and Go 13.4s 13.5s 11.4s 12.35s 12.2s 12.0s 11.3s 13.4s 13.3s 13.4s WNL, use of lofstrand crutches, >14 sec indicates increased risk for falls  2 minute walk test [employer] Def[employer] 264' with lofstrand crutches (post: HR: 150, 99%, BORG: 5/10) 233' with lofstrand crutches (post: HR: 145, 97%, BORG: 5/10) 258' with lofstrand crutches (post: BP: 123/74, HR: 130, 100%, BORG: 6/10) 1000 feet is Marine scientist Assessment    33/56 <36/56 (100% risk for falls), 37-45 (80% risk for falls); 46-51 (>50% risk for falls); 52-55 (lower risk <25% of falls)  FOTO 51 49 53 53 49 50 45 47  45 50 Goal is to reach 59      TODAY'S TREATMENT:      SUBJECTIVE: Pt reports that she is doing well today. No changes since the last therapy session. She did have one fall when walking across gravel but she did not hurt herself. No specific questions or concerns.   PAIN:  Are you having pain? No   TREATMENT    Neuromuscular Re-education  Pre-vitals: 135/82, HR: 106 bpm, SpO2%: 100%  Updated outcome measures and goals with patient today (see below):     OUTCOME MEASURES:   TEST 12/11/20 03/02/21  06/11/21 08/20/21 11/12/21 12/21/21 04/01/22 06/21/22 07/27/22 10/28/22 03/08/23 Interpretation  5 times sit<>stand 10.6s 9.6s 8.1s Not Tested 7.4s 8.2s 8.3s 8.6s 7.7s 7.7s 7.6s WNL  10 meter walk test 14.6s = 0.68 m/s 14.3s = 0.70 m/s 13.2s =  0.76 m/s 13.5s=0.74 m/s 14.4s = 0.69 m/s 14.1s = 0.71 m/s 13.7s = 0.73 m/s 14.1s = 0.71 m/s 14.0s =  0.71 m/s 14.4s =  0.69 m/s 14.3s =  0.70 m/s <1.0 m/s indicates increased risk for falls; limited community ambulator  Timed up and Go 13.4s 13.5s 11.4s 12.35s 12.2s 12.0s 11.3s 13.4s 13.3s 13.4s 12.2s WNL, use of lofstrand crutches, >14 sec indicates increased risk for falls  2 minute walk test Def[employer] Deferred Def[employer] Def[employer] Deferred 264' with lofstrand crutches (post: HR: 150, 99%, BORG: 5/10) 233' with lofstrand crutches (post: HR: 145, 97%, BORG: 5/10) 258' with lofstrand crutches (post: BP: 123/74, HR: 130, 100%, BORG: 6/10) 230' with lofstrand crutches (post:, HR: 141, 100%, BORG: 6/10) 1000 feet is community ambulator  BERG Balance Assessment 32/56 32/56 33/56    34/56 34/56 34/56  35/56 31/56 29/56  33/56 33/56  <36/56 (100% risk for falls), 37-45 (80% risk for falls); 46-51 (>50% risk for falls); 52-55 (lower risk <25% of falls)  FOTO 51 49 53 53 49 50 45 47 45 50  49 Goal is to reach 59      [facility] PT Assessment - 03/08/23 1552       Standardized Balance Assessment   Standardized Balance Assessment Berg Balance Test      Berg Balance Test   Sit to Stand Able to stand without using hands and stabilize independently    Standing Unsupported Able to stand safely 2 minutes    Sitting with Back Unsupported but Feet Supported on Floor or Stool Able to sit safely and securely 2 minutes    Stand to Sit Sits safely with minimal use of hands    Transfers Able to transfer safely, definite need of hands    Standing Unsupported with Eyes Closed Able to stand 10 seconds with supervision    Standing Unsupported with Feet Together Needs help to attain  position but able to stand for 30 seconds with feet together    From Standing, Reach Forward with Outstretched Arm Can reach forward >5 cm safely (2")    From Standing Position, Pick up Object from Floor Able to pick up shoe, needs supervision    From Standing Position, Turn to Look Behind Over each Shoulder Turn sideways only but maintains balance    Turn 360 Degrees Needs assistance while turning    Standing Unsupported, Alternately Place Feet on Step/Stool Able to complete >2 steps/needs minimal assist    Standing Unsupported, One Foot in Front Needs help to step but can hold 15 seconds    Standing on One Leg Tries to lift leg/unable to hold 3 seconds but remains standing independently  Total Score 33            Ther-ex  NuStep L1-3 BLE only x 7.5 minutes for warm-up during interval history intake; Split squats alternating forward LE x 10 on each side; Sit to stands from chair without UE support x 10; Side stepping with 2# ankle weights in // bars 10' x 4;     Not performed: BOSU (round side up) step-ups alternating leading LE with BUE support x 10 on each side; Total Gym (TG) Level 22 (L22) double leg squats x 10; TG L22 single leg squats 2 x 10 BLE; TG L22 double leg jumps x 10; Seated LAQ with manual resistance from therapist x 10 BLE; 6" forward step ups alternating leading LE with BUE support x 10 on each side; Nautilus resisted gait with 10# forward, backward, R lateral, and L lateral x 3 each direction, pt using Lofstrand crutches; Hooklying marches 2 x 10 BLE; Hooklying SLR x 10 BLE; Hooklying bridges x 10; Hooklying manually resisted rotation 3s hold x 10 toward each direction; Hooklying heel slide with manually resisted extension by therapist 2 x 10 BLE; Sit to stand from mat table without UE support x 10; Airex feet apart dynamic cone passes with therapist adjusting height and distance from patient as well as challenging pt to cross midline x multiple bouts on each  side;   PATIENT EDUCATION: Education details: Pt educated throughout session about proper posture and technique with exercises. Improved exercise technique, movement at target joints, use of target muscles after min to mod verbal, visual, tactile cues. Outcome measures, Reinforced importance of continued HEP Person educated: Patient Education method: Explanation and Demonstration Education comprehension: verbalized understanding and returned demonstration   HOME EXERCISE PROGRAM: Seated, supine, and core program: Medbridge Access Code: [email or web address]      PT Short Term Goals       PT SHORT TERM GOAL #1   Title Patient will report compliance with HEP for continued strengthening and stability during functional mobility.     Time 6    Period Weeks    Status Achieved              PT Long Term Goals      PT LONG TERM GOAL #1   Title Patient will increase her FOTO score to at least 59 to demonstrate increased function and mobility for higher quality of life.    Baseline 12/11/20: 51; 03/02/21: 49; 06/11/21: 53; 08/20/21: 53; 11/12/21: 49; 12/21/21: 50/ 04/01/22: 45; 06/21/22: 47; 07/27/22: 45; 10/28/22: 50; 03/08/23: 49   Time 12    Period Weeks    Status Partially Met    Target Date 05/31/2023     PT LONG TERM GOAL #2   Title Patient will maintain Berg Balance score of at least 32/56 in order to demonstrate decreased fall risk during functional activities.    Baseline 12/11/20: 32/56; 03/02/21: 32/56; 06/11/21: 16/10; 08/20/2021: 34/56; 11/12/21: 34/56; 12/22/21: 34/56; 04/01/22: 35/56; 06/21/22: 31/56; 07/27/22: 29/56; 10/28/22: 33/56; 03/08/23: 33/56   Time 12    Period Weeks    Status On-going    Target Date 05/31/2023     PT LONG TERM GOAL #3   Title Patient will be able to maintain safe performance with ascending/descending a flight of stairs using lofstrand crutches and railing in order to allow for safe navigation of her home environment.    Baseline 12/11/20: currently able to use lofstrands  and/or railing with supervision by family; 03/02/21: Good maintenance observed; 06/11/21: Peri [place]  maintenance observed; 08/20/2021: Good maintenance observed; 11/13/21: Good maintenance observed, family reports it continues to be the most challenging activity for patient; 12/22/21: Pt demonstrates good maintenance with stairs. She ascends/descends the stairs at home as often as she can without using her chair stairlift; 04/01/22: Pt demonstrates good maintenance with stairs. She ascends/descends the stairs at home as often as she can without using her chair stairlift, pt also performs stairs in the community; 06/21/22: Pt and family reporting increased difficulty and fatigue when performing stairs at home. Pt reports that she is having more DOE; 07/27/22: Pt and family reporting more difficulty and fatigue when performing stairs at home as well as worsening DOE; 10/28/22: Pt reports she continues have difficulty with steps due to balance and strength deficits; 03/09/23: Pt continues with difficulty with steps   Time 12    Period Weeks    Status On-going    Target Date 05/31/2023      PT LONG TERM GOAL #4   Title Patient will complete a TUG test in no more than 13.5 seconds for independent mobility and decreased fall risk maintenance    Baseline 12/11/20: 13.4s; 03/02/21: 13.5s; 06/11/21: 11.4s; 08/20/21: 12.35s; 11/12/21: 12.2s; 12/21/21: 12.0s; 04/01/22: 11.3s; 06/21/22: 13.4s; 07/27/22: 13.3s; 10/28/22: 13.4s; 03/08/23: 12.2s   Time 12    Period Weeks    Status On-going    Target Date 05/31/2023     PT LONG TERM GOAL #5   Title Patient will increase fastest 10 meter walk test to >0.81 m/s as to improve gait speed for better community ambulation and to reduce fall risk.    Baseline 12/11/20: 14.6s = 0.68 m/s; 03/02/21: 14.3s = 0.70 m/s; 06/11/21: 13.2s = 0.76 m/s; 08/20/21: 13.5s= 0.74 m/s; 11/12/21: 14.4s = 0.69 m/s; 12/21/21: 14.1s = 0.71 m/s; 04/01/22: 13.7s = 0.73 m/s; 06/21/22: 14.1s = 0.71 m/s; 07/27/22: 14.0s =  0.71 m/s;  10/28/22: 14.4s =  0.69 m/s; 03/08/23: 14.3s =  0.70 m/s   Time 12    Period Weeks    Status Partially Met    Target Date 05/31/2023              Plan     Clinical Impression Statement Pt demonstrates excellent motivation during session today. Updated outcome measures and goals. Her TUG improved to 12.2s since the last time it was updated (13.4s). However her BERG has remained unchanged at 33/56. Her 5TSTS remained unchanged compared to her last update as did her 18m gait speed. Patient's FOTO remained relatively unchanged at 49. Her Two Minute Walk Test was slightly worse compared to the last update. Overall pt showed improvement in some outcome measures, a slight drop in others, but no change in the majority of them. This indicates excellent maintenance of patient's function. However she continues to report more difficulty with stairs especially after her therapy frequency was decreased. Discharge attempted at the end of 2021 and pt demonstrated a drop in her function during her prolonged absence out of therapy despite good consistency with her HEP. Frequency was also decreased from every other week to once per month to find the minimum effective frequency, however, unfortunately this resulted in an unacceptable decline in patient's function. Plan is to continue frequency of one therapy session every other week throughout 2024 in order to maintain her function. Pt is regularly exercising at home with the assistance of family as needed/appropriate. She is unable to complete all necessary exercises safely at home either independently or with family assistance due to need  from physical therapist to monitor and correct form and technique as well as to provide safe guarding. Provided continued reinforcement regarding the importance of consistency with HEP and therapy sessions will focus on appropriate modifications to her HEP, family education, and maintenance of her function. Therapist will reassess  patient's outcome measures at regular intervals to assess need for continued maintenance therapy frequency. Pt requires physical therapy intervention to maintain her functional status including LE strength, balance, and gait speed/quality and she has not achieved maximal benefit from therapy services. She will benefit from continued PT services to address these deficits and maintain her function at home and school.    Personal Factors and Comorbidities Comorbidity 1;Past/Current Experience;Time since onset of injury/illness/exacerbation;Transportation;Social Background    Comorbidities spina bifida    Examination-Activity Limitations Bend;Carry;Continence;Lift;Reach Overhead;Squat;Stairs;Stand;Transfers    Examination-Participation Restrictions Church;Community Activity;Driving;Laundry;Meal Prep;Volunteer;Yard Work    Stability/Clinical Decision Making Stable/Uncomplicated    Rehab Potential Good    Clinical Impairments Affecting Rehab Potential weakness and decreased standing balance    PT Frequency 1 visit every other week   PT Duration 12 weeks    PT Treatment/Interventions ADLs/Self Care Home Management;Aquatic Therapy;Biofeedback; Moist Heat;Electrical Stimulation;DME Instruction;Gait training;Stair training;Functional mobility training;Neuromuscular re-education;Balance training;Therapeutic exercise;Therapeutic activities;Patient/family education;Orthotic Fit/Training;;Manual techniques;Passive range of motion;Energy conservation;Splinting;Taping;Visual/perceptual remediation/compensation;Cryotherapy    PT Next Visit Plan Update outcome measures/goals, review and modify HEP as appropriate, balance and strength exercises    PT Home Exercise Plan Seated, supine, and core program: [email or web address]    Consulted and Agree with Plan of Care Patient             Lynnea Maizes PT, DPT, G[facility] Shanterria Franta, PT 03/09/2023, 8:58 PM

## 2023-03-17 DIAGNOSIS — G919 Hydrocephalus, unspecified: Secondary | ICD-10-CM | POA: Insufficient documentation

## 2023-03-22 ENCOUNTER — Ambulatory Visit: Payer: Medicaid Other | Attending: Student

## 2023-03-22 DIAGNOSIS — M6281 Muscle weakness (generalized): Secondary | ICD-10-CM | POA: Insufficient documentation

## 2023-03-22 DIAGNOSIS — R2681 Unsteadiness on feet: Secondary | ICD-10-CM | POA: Insufficient documentation

## 2023-03-22 NOTE — Therapy (Signed)
OUTPATIENT PHYSICAL THERAPY TREATMENT NOTE  Patient Name: Michelle Randall MRN: [id number] DOB:July 01, 1996, 27 y.o., female Today's Date: 03/22/2023  PCP: Carren Rang, PA-C REFERRING PROVIDER: Carren Rang, PA-C   PT End of Session - 03/22/23 1538     Visit Number 57    Number of Visits 82    Date for PT Re-Evaluation 05/31/23    Authorization Type eval: 12/11/20, recert: 03/08/23    Authorization Time Period [facility] auth 5/7-7/29 for 6 PT visits, Medicaid  VL: 27 combined PT/OT/SLP per year  Auth: is required    Authorization - Visit Number 1    Authorization - Number of Visits 6    PT Start Time 1532    PT Stop Time 1615    PT Time Calculation (min) 43 min    Equipment Utilized During Treatment Gait belt   Bilateral AFOs, lofstrand crutches   Activity Tolerance Patient tolerated treatment well    Behavior During Therapy WFL for tasks assessed/performed             Past Medical History:  Diagnosis Date   Neurogenic bladder    Neurogenic bowel    S/P VP shunt    Spina bifida (HCC)    Past Surgical History:  Procedure Laterality Date   VENTRICULOPERITONEAL SHUNT     There are no problems to display for this patient.  REFERRING DIAG: R26.81 (ICD-10-CM) - Unsteadiness on feet  THERAPY DIAG: Muscle weakness (generalized)  Unsteadiness on feet  PERTINENT HISTORY: Patient is a pleasant 27 year old female who presents for continuation of care for generalized weakness/ coordination deficits secondary to diagnosis of spina bifida. PMH includes VP shunt, spina bifida, and neurogenic bowel/bladder. Patient was discharged from this clinic at the end of 2021 due to meeting therapy cap for insurance that year. Patient reports that she has continued performing her HEP but notes a decline when she stops therapy due to inability to safely perform all of the balance and strength exercises as at home. One of the biggest challenge she is experiencing is stair navigation She reports  having multiple near falls when negotiating stairs at home. Patient must ascend/descend stairs to reach bedroom upstairs increasing her risk for falls daily.   PRECAUTIONS: Fall risk  OUTCOME MEASURES: TEST 12/11/20 03/02/21 06/11/21 08/20/21 11/12/21 12/21/21 04/01/22 06/21/22 07/27/22 10/28/22 03/08/23 Interpretation  5 times sit<>stand 10.6s 9.6s 8.1s Not Tested 7.4s 8.2s 8.3s 8.6s 7.7s 7.7s 7.6s WNL  10 meter walk test 14.6s = 0.68 m/s 14.3s = 0.70 m/s 13.2s =  0.76 m/s 13.5s=0.74 m/s 14.4s = 0.69 m/s 14.1s = 0.71 m/s 13.7s = 0.73 m/s 14.1s = 0.71 m/s 14.0s =  0.71 m/s 14.4s =  0.69 m/s 14.3s =  0.70 m/s <1.0 m/s indicates increased risk for falls; limited community ambulator  Timed up and Go 13.4s 13.5s 11.4s 12.35s 12.2s 12.0s 11.3s 13.4s 13.3s 13.4s 12.2s WNL, use of lofstrand crutches, >14 sec indicates increased risk for falls  2 minute walk test Deferred Def[employer] Deferred Deferred [employer] 264' with lofstrand crutches (post: HR: 150, 99%, BORG: 5/10) 233' with lofstrand crutches (post: HR: 145, 97%, BORG: 5/10) 258' with lofstrand crutches (post: BP: 123/74, HR: 130, 100%, BORG: 6/10) 230' with lofstrand crutches (post:, HR: 141, 100%, BORG: 6/10) 1000 feet is community ambulator  BERG Balance Assessment 32/56 32/56 33/56    34/56 34/56 34/56  35/56 31/56 29/56  33/56 33/56  <36/56 (100% risk for falls), 37-45 (80% risk for falls); 46-51 (>50% risk for falls); 52-55 (  lower risk <25% of falls)  FOTO 51 49 53 53 49 50 45 47 45 50  49 Goal is to reach 59     TODAY'S TREATMENT:      SUBJECTIVE: Pt reports that she is doing well today. No changes since the last therapy session. No reported falls and no specific questions or concerns.   PAIN:  Are you having pain? No   TREATMENT   Ther-ex  NuStep L1-4 BLE only x 10 minutes for warm-up during interval history intake (5 minutes unbilled); Total Gym (TG) Level 22 (L22) double leg squats x 10; TG L22 single leg squats 2 x 10  BLE; TG L22 double leg jumps x 10; 6" forward step ups alternating leading LE with BUE support x 10 on each side; Split squats alternating forward LE x 10 on each side; Lunge walks forward 10' x 2; Sit to stands from chair without UE support x 10; Side stepping in // bars 10' x 4; Seated clams x 10; Seated adductor squeeze with manual resistance x 10; Seated alternating LAQ with manual resistance x 10 BLE;     Not performed: BOSU (round side up) step-ups alternating leading LE with BUE support x 10 on each side; Seated LAQ with manual resistance from therapist x 10 BLE; Nautilus resisted gait with 10# forward, backward, R lateral, and L lateral x 3 each direction, pt using Lofstrand crutches; Hooklying marches 2 x 10 BLE; Hooklying SLR x 10 BLE; Hooklying bridges x 10; Hooklying manually resisted rotation 3s hold x 10 toward each direction; Hooklying heel slide with manually resisted extension by therapist 2 x 10 BLE; Sit to stand from mat table without UE support x 10; Airex feet apart dynamic cone passes with therapist adjusting height and distance from patient as well as challenging pt to cross midline x multiple bouts on each side;   PATIENT EDUCATION: Education details: Pt educated throughout session about proper posture and technique with exercises. Improved exercise technique, movement at target joints, use of target muscles after min to mod verbal, visual, tactile cues.  Person educated: Patient Education method: Medical illustrator Education comprehension: verbalized understanding and returned demonstration   HOME EXERCISE PROGRAM: Seated, supine, and core program: Medbridge Access Code: [email or web address]      PT Short Term Goals       PT SHORT TERM GOAL #1   Title Patient will report compliance with HEP for continued strengthening and stability during functional mobility.     Time 6    Period Weeks    Status Achieved              PT Long Term Goals       PT LONG TERM GOAL #1   Title Patient will increase her FOTO score to at least 59 to demonstrate increased function and mobility for higher quality of life.    Baseline 12/11/20: 51; 03/02/21: 49; 06/11/21: 53; 08/20/21: 53; 11/12/21: 49; 12/21/21: 50/ 04/01/22: 45; 06/21/22: 47; 07/27/22: 45; 10/28/22: 50; 03/08/23: 49   Time 12    Period Weeks    Status Partially Met    Target Date 05/31/2023     PT LONG TERM GOAL #2   Title Patient will maintain Berg Balance score of at least 32/56 in order to demonstrate decreased fall risk during functional activities.    Baseline 12/11/20: 32/56; 03/02/21: 32/56; 06/11/21: 33/56; 08/20/2021: 09/81; 11/12/21: 19/14; 12/22/21: 78/29; 04/01/22: 35/56; 06/21/22: 31/56; 07/27/22: 29/56; 10/28/22: 33/56; 03/08/23: 33/56   Time 12  Period Weeks    Status On-going    Target Date 05/31/2023     PT LONG TERM GOAL #3   Title Patient will be able to maintain safe performance with ascending/descending a flight of stairs using lofstrand crutches and railing in order to allow for safe navigation of her home environment.    Baseline 12/11/20: currently able to use lofstrands and/or railing with supervision by family; 03/02/21: Good maintenance observed; 06/11/21: Good maintenance observed; 08/20/2021: Good maintenance observed; 11/13/21: Good maintenance observed, family reports it continues to be the most challenging activity for patient; 12/22/21: Pt demonstrates good maintenance with stairs. She ascends/descends the stairs at home as often as she can without using her chair stairlift; 04/01/22: Pt demonstrates good maintenance with stairs. She ascends/descends the stairs at home as often as she can without using her chair stairlift, pt also performs stairs in the community; 06/21/22: Pt and family reporting increased difficulty and fatigue when performing stairs at home. Pt reports that she is having more DOE; 07/27/22: Pt and family reporting more difficulty and fatigue when performing stairs at home  as well as worsening DOE; 10/28/22: Pt reports she continues have difficulty with steps due to balance and strength deficits; 03/09/23: Pt continues with difficulty with steps   Time 12    Period Weeks    Status On-going    Target Date 05/31/2023      PT LONG TERM GOAL #4   Title Patient will complete a TUG test in no more than 13.5 seconds for independent mobility and decreased fall risk maintenance    Baseline 12/11/20: 13.4s; 03/02/21: 13.5s; 06/11/21: 11.4s; 08/20/21: 12.35s; 11/12/21: 12.2s; 12/21/21: 12.0s; 04/01/22: 11.3s; 06/21/22: 13.4s; 07/27/22: 13.3s; 10/28/22: 13.4s; 03/08/23: 12.2s   Time 12    Period Weeks    Status On-going    Target Date 05/31/2023     PT LONG TERM GOAL #5   Title Patient will increase fastest 10 meter walk test to >0.81 m/s as to improve gait speed for better community ambulation and to reduce fall risk.    Baseline 12/11/20: 14.6s = 0.68 m/s; 03/02/21: 14.3s = 0.70 m/s; 06/11/21: 13.2s = 0.76 m/s; 08/20/21: 13.5s= 0.74 m/s; 11/12/21: 14.4s = 0.69 m/s; 12/21/21: 14.1s = 0.71 m/s; 04/01/22: 13.7s = 0.73 m/s; 06/21/22: 14.1s = 0.71 m/s; 07/27/22: 14.0s =  0.71 m/s; 10/28/22: 14.4s =  0.69 m/s; 03/08/23: 14.3s =  0.70 m/s   Time 12    Period Weeks    Status Partially Met    Target Date 05/31/2023              Plan     Clinical Impression Statement Pt demonstrates excellent motivation during session today and is able to participate in all therapeutic interventions during session as instructed. Session focused on strengthening today since goals were updated during last session and for a couple visits prior to that strengthening was deferred because pt was fasting for Ramadan. Returned to squats and jumps on Total Gym for power training. Discharge attempted at the end of 2021 and pt demonstrated a drop in her function during her prolonged absence out of therapy despite good consistency with her HEP. Frequency was also decreased from every other week to once per month in an attempt to  maintain as much as possible and unfortunately this resulted in an unacceptable decline in patient's function. Plan is to maintain frequency at every other week throughout 2024 in order to maintain her function. Pt is regularly exercising at home with  the assistance of family as needed/appropriate. She is unable to complete all necessary exercises safely at home either independently or with family assistance due to need from physical therapist to monitor and correct form and technique as well as to provide safe guarding. Provided continued reinforcement regarding the importance of consistency with HEP and therapy sessions will focus on appropriate modifications to her HEP, family education, and maintenance of her function. Therapist will reassess patient's outcome measures at regular intervals to assess need for continued maintenance therapy frequency. Pt requires physical therapy intervention to maintain her functional status including LE strength, balance, and gait speed/quality and she has not achieved maximal benefit from therapy services. She will benefit from continued PT services to address these deficits and maintain her function at home and school.     Personal Factors and Comorbidities Comorbidity 1;Past/Current Experience;Time since onset of injury/illness/exacerbation;Transportation;Social Background    Comorbidities spina bifida    Examination-Activity Limitations Bend;Carry;Continence;Lift;Reach Overhead;Squat;Stairs;Stand;Transfers    Examination-Participation Restrictions Church;Community Activity;Driving;Laundry;Meal Prep;Volunteer;Yard Work    Stability/Clinical Decision Making Stable/Uncomplicated    Rehab Potential Good    Clinical Impairments Affecting Rehab Potential weakness and decreased standing balance    PT Frequency 1 visit every other week   PT Duration 12 weeks    PT Treatment/Interventions ADLs/Self Care Home Management;Aquatic Therapy;Biofeedback; Moist Heat;Electrical  Stimulation;DME Instruction;Gait training;Stair training;Functional mobility training;Neuromuscular re-education;Balance training;Therapeutic exercise;Therapeutic activities;Patient/family education;Orthotic Fit/Training;;Manual techniques;Passive range of motion;Energy conservation;Splinting;Taping;Visual/perceptual remediation/compensation;Cryotherapy    PT Next Visit Plan Update outcome measures/goals, review and modify HEP as appropriate, balance and strength exercises    PT Home Exercise Plan Seated, supine, and core program: [email or web address]    Consulted and Agree with Plan of Care Patient             Lynnea Maizes PT, DPT, [facility], PT 03/22/2023, 6:12 PM

## 2023-04-06 NOTE — Therapy (Signed)
OUTPATIENT PHYSICAL THERAPY TREATMENT NOTE  Patient Name: Michelle Randall MRN: [id number] DOB:08-Feb-1996, 27 y.o., female Today's Date: 04/08/2023  PCP: Carren Rang, PA-C REFERRING PROVIDER: Carren Rang, PA-C   PT End of Session - 04/07/23 1457     Visit Number 58    Number of Visits 82    Date for PT Re-Evaluation 05/31/23    Authorization Type eval: 12/11/20, recert: 03/08/23    Authorization Time Period [facility] auth 5/7-7/29 for 6 PT visits, Medicaid  VL: 27 combined PT/OT/SLP per year  Auth: is required    Authorization - Visit Number 2    Authorization - Number of Visits 6    PT Start Time 1445    PT Stop Time 1530    PT Time Calculation (min) 45 min    Equipment Utilized During Treatment Gait belt   Bilateral AFOs, lofstrand crutches   Activity Tolerance Patient tolerated treatment well    Behavior During Therapy WFL for tasks assessed/performed            Past Medical History:  Diagnosis Date   Neurogenic bladder    Neurogenic bowel    S/P VP shunt    Spina bifida (HCC)    Past Surgical History:  Procedure Laterality Date   VENTRICULOPERITONEAL SHUNT     There are no problems to display for this patient.  REFERRING DIAG: R26.81 (ICD-10-CM) - Unsteadiness on feet  THERAPY DIAG: Muscle weakness (generalized)  Unsteadiness on feet  PERTINENT HISTORY: Patient is a pleasant 27 year old female who presents for continuation of care for generalized weakness/ coordination deficits secondary to diagnosis of spina bifida. PMH includes VP shunt, spina bifida, and neurogenic bowel/bladder. Patient was discharged from this clinic at the end of 2021 due to meeting therapy cap for insurance that year. Patient reports that she has continued performing her HEP but notes a decline when she stops therapy due to inability to safely perform all of the balance and strength exercises as at home. One of the biggest challenge she is experiencing is stair navigation She reports  having multiple near falls when negotiating stairs at home. Patient must ascend/descend stairs to reach bedroom upstairs increasing her risk for falls daily.   PRECAUTIONS: Fall risk  OUTCOME MEASURES: TEST 12/11/20 03/02/21 06/11/21 08/20/21 11/12/21 12/21/21 04/01/22 06/21/22 07/27/22 10/28/22 03/08/23 Interpretation  5 times sit<>stand 10.6s 9.6s 8.1s Not Tested 7.4s 8.2s 8.3s 8.6s 7.7s 7.7s 7.6s WNL  10 meter walk test 14.6s = 0.68 m/s 14.3s = 0.70 m/s 13.2s =  0.76 m/s 13.5s=0.74 m/s 14.4s = 0.69 m/s 14.1s = 0.71 m/s 13.7s = 0.73 m/s 14.1s = 0.71 m/s 14.0s =  0.71 m/s 14.4s =  0.69 m/s 14.3s =  0.70 m/s <1.0 m/s indicates increased risk for falls; limited community ambulator  Timed up and Go 13.4s 13.5s 11.4s 12.35s 12.2s 12.0s 11.3s 13.4s 13.3s 13.4s 12.2s WNL, use of lofstrand crutches, >14 sec indicates increased risk for falls  2 minute walk test [employer] 264' with lofstrand crutches (post: HR: 150, 99%, BORG: 5/10) 233' with lofstrand crutches (post: HR: 145, 97%, BORG: 5/10) 258' with lofstrand crutches (post: BP: 123/74, HR: 130, 100%, BORG: 6/10) 230' with lofstrand crutches (post:, HR: 141, 100%, BORG: 6/10) 1000 feet is community ambulator  BERG Balance Assessment 32/56 32/56 33/56    34/56 34/56 34/56  35/56 31/56 29/56  33/56 33/56  <36/56 (100% risk for falls), 37-45 (80% risk for falls); 46-51 (>50% risk for falls); 52-55 (lower  risk <25% of falls)  FOTO 51 49 53 53 49 50 45 47 45 50  49 Goal is to reach 59     TODAY'S TREATMENT:      SUBJECTIVE: Pt reports that she is doing well today. No changes since the last therapy session. No reported falls and no specific questions or concerns.   PAIN:  Are you having pain? No   TREATMENT   Ther-ex  NuStep L1-4 BLE only x 10 minutes for warm-up during interval history intake (5 minutes unbilled); Nautilus resisted gait with 20# forward, backward, R lateral, and L lateral x 3 each  direction, pt using Lofstrand crutches; Seated adductor squeeze with manual resistance x 10; Seated alternating LAQ with manual resistance x 10 BLE;     Neuromuscular Re-education  Airex feet apart (FA) static balance eyes open x 30s; Airex FA eyes closed x 30s; Airex staggered balance with front foot on 6" step x 60s on each side; Airex feet apart (FA) dynamic reaching in multiple planes and crossing midline x multiple trials on each UE; Reinforced continued performance of HEP;   Not performed: BOSU (round side up) step-ups alternating leading LE with BUE support x 10 on each side; Seated LAQ with manual resistance from therapist x 10 BLE; Sit to stand from mat table without UE support x 10; Total Gym (TG) Level 22 (L22) double leg squats x 10; TG L22 single leg squats 2 x 10 BLE; TG L22 double leg jumps x 10; 6" forward step ups alternating leading LE with BUE support x 10 on each side; Split squats alternating forward LE x 10 on each side; Lunge walks forward 10' x 2; Sit to stands from chair without UE support x 10; Side stepping in // bars 10' x 4; Airex feet apart dynamic cone passes with therapist adjusting height and distance from patient as well as challenging pt to cross midline x multiple bouts on each side;   PATIENT EDUCATION: Education details: Pt educated throughout session about proper posture and technique with exercises. Improved exercise technique, movement at target joints, use of target muscles after min to mod verbal, visual, tactile cues. Balance exercises Person educated: Patient Education method: Explanation and Demonstration Education comprehension: verbalized understanding and returned demonstration   HOME EXERCISE PROGRAM: Seated, supine, and core program: Medbridge Access Code: [email or web address]      PT Short Term Goals       PT SHORT TERM GOAL #1   Title Patient will report compliance with HEP for continued strengthening and stability during functional  mobility.     Time 6    Period Weeks    Status Achieved              PT Long Term Goals      PT LONG TERM GOAL #1   Title Patient will increase her FOTO score to at least 59 to demonstrate increased function and mobility for higher quality of life.    Baseline 12/11/20: 51; 03/02/21: 49; 06/11/21: 53; 08/20/21: 53; 11/12/21: 49; 12/21/21: 50/ 04/01/22: 45; 06/21/22: 47; 07/27/22: 45; 10/28/22: 50; 03/08/23: 49   Time 12    Period Weeks    Status Partially Met    Target Date 05/31/2023     PT LONG TERM GOAL #2   Title Patient will maintain Berg Balance score of at least 32/56 in order to demonstrate decreased fall risk during functional activities.    Baseline 12/11/20: 32/56; 03/02/21: 32/56; 06/11/21: 33/56; 08/20/2021: 84/13; 11/12/21: 24/40; 12/22/21: 10/27;  04/01/22: 35/56; 06/21/22: 31/56; 07/27/22: 29/56; 10/28/22: 33/56; 03/08/23: 33/56   Time 12    Period Weeks    Status On-going    Target Date 05/31/2023     PT LONG TERM GOAL #3   Title Patient will be able to maintain safe performance with ascending/descending a flight of stairs using lofstrand crutches and railing in order to allow for safe navigation of her home environment.    Baseline 12/11/20: currently able to use lofstrands and/or railing with supervision by family; 03/02/21: Good maintenance observed; 06/11/21: Good maintenance observed; 08/20/2021: Good maintenance observed; 11/13/21: Good maintenance observed, family reports it continues to be the most challenging activity for patient; 12/22/21: Pt demonstrates good maintenance with stairs. She ascends/descends the stairs at home as often as she can without using her chair stairlift; 04/01/22: Pt demonstrates good maintenance with stairs. She ascends/descends the stairs at home as often as she can without using her chair stairlift, pt also performs stairs in the community; 06/21/22: Pt and family reporting increased difficulty and fatigue when performing stairs at home. Pt reports that she is  having more DOE; 07/27/22: Pt and family reporting more difficulty and fatigue when performing stairs at home as well as worsening DOE; 10/28/22: Pt reports she continues have difficulty with steps due to balance and strength deficits; 03/09/23: Pt continues with difficulty with steps   Time 12    Period Weeks    Status On-going    Target Date 05/31/2023      PT LONG TERM GOAL #4   Title Patient will complete a TUG test in no more than 13.5 seconds for independent mobility and decreased fall risk maintenance    Baseline 12/11/20: 13.4s; 03/02/21: 13.5s; 06/11/21: 11.4s; 08/20/21: 12.35s; 11/12/21: 12.2s; 12/21/21: 12.0s; 04/01/22: 11.3s; 06/21/22: 13.4s; 07/27/22: 13.3s; 10/28/22: 13.4s; 03/08/23: 12.2s   Time 12    Period Weeks    Status On-going    Target Date 05/31/2023     PT LONG TERM GOAL #5   Title Patient will increase fastest 10 meter walk test to >0.81 m/s as to improve gait speed for better community ambulation and to reduce fall risk.    Baseline 12/11/20: 14.6s = 0.68 m/s; 03/02/21: 14.3s = 0.70 m/s; 06/11/21: 13.2s = 0.76 m/s; 08/20/21: 13.5s= 0.74 m/s; 11/12/21: 14.4s = 0.69 m/s; 12/21/21: 14.1s = 0.71 m/s; 04/01/22: 13.7s = 0.73 m/s; 06/21/22: 14.1s = 0.71 m/s; 07/27/22: 14.0s =  0.71 m/s; 10/28/22: 14.4s =  0.69 m/s; 03/08/23: 14.3s =  0.70 m/s   Time 12    Period Weeks    Status Partially Met    Target Date 05/31/2023              Plan     Clinical Impression Statement Pt demonstrates excellent motivation during session today and is able to participate in all therapeutic interventions during session as instructed. Session focused on both strengthening and balance exercises. Reintroduced resisted gait with Nautilus machine and pt is able to progress to 20#. Challenged balance on unstable Airex pad and reintroduced dynamic reaching. Discharge attempted at the end of 2021 and pt demonstrated a drop in her function during her prolonged absence out of therapy despite good consistency with her HEP.  Frequency was also decreased from every other week to once per month in an attempt to maintain as much as possible and unfortunately this resulted in an unacceptable decline in patient's function. Plan is to maintain frequency at every other week throughout 2024 in order to maintain  her function. Pt is regularly exercising at home with the assistance of family as needed/appropriate. She is unable to complete all necessary exercises safely at home either independently or with family assistance due to need from physical therapist to monitor and correct form and technique as well as to provide safe guarding. Provided continued reinforcement regarding the importance of consistency with HEP and therapy sessions will focus on appropriate modifications to her HEP, family education, and maintenance of her function. Therapist will reassess patient's outcome measures at regular intervals to assess need for continued maintenance therapy frequency. Pt requires physical therapy intervention to maintain her functional status including LE strength, balance, and gait speed/quality and she has not achieved maximal benefit from therapy services. She will benefit from continued PT services to address these deficits and maintain her function at home and school.     Personal Factors and Comorbidities Comorbidity 1;Past/Current Experience;Time since onset of injury/illness/exacerbation;Transportation;Social Background    Comorbidities spina bifida    Examination-Activity Limitations Bend;Carry;Continence;Lift;Reach Overhead;Squat;Stairs;Stand;Transfers    Examination-Participation Restrictions Church;Community Activity;Driving;Laundry;Meal Prep;Volunteer;Yard Work    Stability/Clinical Decision Making Stable/Uncomplicated    Rehab Potential Good    Clinical Impairments Affecting Rehab Potential weakness and decreased standing balance    PT Frequency 1 visit every other week   PT Duration 12 weeks    PT Treatment/Interventions  ADLs/Self Care Home Management;Aquatic Therapy;Biofeedback; Moist Heat;Electrical Stimulation;DME Instruction;Gait training;Stair training;Functional mobility training;Neuromuscular re-education;Balance training;Therapeutic exercise;Therapeutic activities;Patient/family education;Orthotic Fit/Training;;Manual techniques;Passive range of motion;Energy conservation;Splinting;Taping;Visual/perceptual remediation/compensation;Cryotherapy    PT Next Visit Plan review and modify HEP as appropriate, balance and strength exercises    PT Home Exercise Plan Seated, supine, and core program: [email or web address]    Consulted and Agree with Plan of Care Patient             Lynnea Maizes PT, DPT, [facility] Mylea Roarty, PT 04/08/2023, 1:49 PM

## 2023-04-07 ENCOUNTER — Ambulatory Visit: Payer: Medicaid Other

## 2023-04-07 DIAGNOSIS — R2681 Unsteadiness on feet: Secondary | ICD-10-CM

## 2023-04-07 DIAGNOSIS — M6281 Muscle weakness (generalized): Secondary | ICD-10-CM

## 2023-04-17 NOTE — Therapy (Signed)
OUTPATIENT PHYSICAL THERAPY TREATMENT NOTE  Patient Name: Michelle Randall MRN: [id number] DOB:14-May-1996, 27 y.o., female Today's Date: 04/20/2023  PCP: Carren Rang, PA-C REFERRING PROVIDER: Carren Rang, PA-C   PT End of Session - 04/20/23 1043     Visit Number 59    Number of Visits 82    Date for PT Re-Evaluation 05/31/23    Authorization Type eval: 12/11/20, recert: 03/08/23    Authorization Time Period [facility] auth 5/7-7/29 for 6 PT visits, Medicaid  VL: 27 combined PT/OT/SLP per year  Auth: is required    Authorization - Number of Visits 6    Equipment Utilized During Treatment Gait belt   Bilateral AFOs, lofstrand crutches   Activity Tolerance Patient tolerated treatment well    Behavior During Therapy WFL for tasks assessed/performed            Past Medical History:  Diagnosis Date   Neurogenic bladder    Neurogenic bowel    S/P VP shunt    Spina bifida (HCC)    Past Surgical History:  Procedure Laterality Date   VENTRICULOPERITONEAL SHUNT     There are no problems to display for this patient.  REFERRING DIAG: R26.81 (ICD-10-CM) - Unsteadiness on feet  THERAPY DIAG: Muscle weakness (generalized)  Unsteadiness on feet  PERTINENT HISTORY: Patient is a pleasant 27 year old female who presents for continuation of care for generalized weakness/ coordination deficits secondary to diagnosis of spina bifida. PMH includes VP shunt, spina bifida, and neurogenic bowel/bladder. Patient was discharged from this clinic at the end of 2021 due to meeting therapy cap for insurance that year. Patient reports that she has continued performing her HEP but notes a decline when she stops therapy due to inability to safely perform all of the balance and strength exercises as at home. One of the biggest challenge she is experiencing is stair navigation She reports having multiple near falls when negotiating stairs at home. Patient must ascend/descend stairs to reach bedroom upstairs  increasing her risk for falls daily.   PRECAUTIONS: Fall risk  OUTCOME MEASURES: TEST 12/11/20 03/02/21 06/11/21 08/20/21 11/12/21 12/21/21 04/01/22 06/21/22 07/27/22 10/28/22 03/08/23 Interpretation  5 times sit<>stand 10.6s 9.6s 8.1s Not Tested 7.4s 8.2s 8.3s 8.6s 7.7s 7.7s 7.6s WNL  10 meter walk test 14.6s = 0.68 m/s 14.3s = 0.70 m/s 13.2s =  0.76 m/s 13.5s=0.74 m/s 14.4s = 0.69 m/s 14.1s = 0.71 m/s 13.7s = 0.73 m/s 14.1s = 0.71 m/s 14.0s =  0.71 m/s 14.4s =  0.69 m/s 14.3s =  0.70 m/s <1.0 m/s indicates increased risk for falls; limited community ambulator  Timed up and Go 13.4s 13.5s 11.4s 12.35s 12.2s 12.0s 11.3s 13.4s 13.3s 13.4s 12.2s WNL, use of lofstrand crutches, >14 sec indicates increased risk for falls  2 minute walk test [employer] Def[employer] 264' with lofstrand crutches (post: HR: 150, 99%, BORG: 5/10) 233' with lofstrand crutches (post: HR: 145, 97%, BORG: 5/10) 258' with lofstrand crutches (post: BP: 123/74, HR: 130, 100%, BORG: 6/10) 230' with lofstrand crutches (post:, HR: 141, 100%, BORG: 6/10) 1000 feet is community ambulator  BERG Balance Assessment 32/56 32/56 33/56    34/56 34/56 34/56  35/56 31/56 29/56  33/56 33/56  <36/56 (100% risk for falls), 37-45 (80% risk for falls); 46-51 (>50% risk for falls); 52-55 (lower risk <25% of falls)  FOTO 51 49 53 53 49 50 45 47 45 50  49 Goal is to reach 59     TODAY'S TREATMENT:  SUBJECTIVE: Pt reports that she is doing well today. No changes since the last therapy session. All DME is functioning properly. No reported falls and no specific questions or concerns at this time.   PAIN:  Are you having pain? No   TREATMENT  Ther-ex  NuStep L1-4 BLE only x 8 minutes for warm-up during interval history intake (5 minutes unbilled); Total Gym (TG) Level 22 (L22) double leg squats x 10; TG L22 single leg squats 2 x 10 BLE; TG L22 double leg jumps 2 x 10; 6" forward step ups alternating leading LE  with BUE support x 10 on each side; Sit to stands from chair without UE support x 10;     Neuromuscular Re-education  Forward gait stepping over 1" boards in // bars with single UE support x multiple laps; Forward/retro gait in // bars with single LUE support and balancing ball on top of agility cone in opposite UE for dual motor task x 1 length each direction; Sidestepping in // bars with single LUE support and balancing ball on top of agility cone in opposite UE for dual motor task x 1 length each direction x 1 length each direction; Feet apart dynamic reaching in multiple planes and crossing midline x multiple trials on each UE; Reinforced continued performance of HEP;   Not performed: BOSU (round side up) step-ups alternating leading LE with BUE support x 10 on each side; Seated LAQ with manual resistance from therapist x 10 BLE; Split squats alternating forward LE x 10 on each side; Lunge walks forward 10' x 2; Airex feet apart dynamic cone passes with therapist adjusting height and distance from patient as well as challenging pt to cross midline x multiple bouts on each side; Airex feet apart (FA) static balance eyes open x 30s; Airex FA eyes closed x 30s; Airex staggered balance with front foot on 6" step x 60s on each side; Nautilus resisted gait with 20# forward, backward, R lateral, and L lateral x 3 each direction, pt using Lofstrand crutches; Seated adductor squeeze with manual resistance x 10; Seated alternating LAQ with manual resistance x 10 BLE;   PATIENT EDUCATION: Education details: Pt educated throughout session about proper posture and technique with exercises. Improved exercise technique, movement at target joints, use of target muscles after min to mod verbal, visual, tactile cues. Balance exercises Person educated: Patient Education method: Explanation and Demonstration Education comprehension: verbalized understanding and returned demonstration   HOME EXERCISE  PROGRAM: Seated, supine, and core program: Medbridge Access Code: [email or web address]      PT Short Term Goals       PT SHORT TERM GOAL #1   Title Patient will report compliance with HEP for continued strengthening and stability during functional mobility.     Time 6    Period Weeks    Status Achieved              PT Long Term Goals      PT LONG TERM GOAL #1   Title Patient will increase her FOTO score to at least 59 to demonstrate increased function and mobility for higher quality of life.    Baseline 12/11/20: 51; 03/02/21: 49; 06/11/21: 53; 08/20/21: 53; 11/12/21: 49; 12/21/21: 50/ 04/01/22: 45; 06/21/22: 47; 07/27/22: 45; 10/28/22: 50; 03/08/23: 49   Time 12    Period Weeks    Status Partially Met    Target Date 05/31/2023     PT LONG TERM GOAL #2   Title Patient will maintain Sharlene Motts  Balance score of at least 32/56 in order to demonstrate decreased fall risk during functional activities.    Baseline 12/11/20: 32/56; 03/02/21: 32/56; 06/11/21: 47/82; 08/20/2021: 34/56; 11/12/21: 34/56; 12/22/21: 34/56; 04/01/22: 35/56; 06/21/22: 31/56; 07/27/22: 29/56; 10/28/22: 33/56; 03/08/23: 33/56   Time 12    Period Weeks    Status On-going    Target Date 05/31/2023     PT LONG TERM GOAL #3   Title Patient will be able to maintain safe performance with ascending/descending a flight of stairs using lofstrand crutches and railing in order to allow for safe navigation of her home environment.    Baseline 12/11/20: currently able to use lofstrands and/or railing with supervision by family; 03/02/21: Good maintenance observed; 06/11/21: Good maintenance observed; 08/20/2021: Good maintenance observed; 11/13/21: Good maintenance observed, family reports it continues to be the most challenging activity for patient; 12/22/21: Pt demonstrates good maintenance with stairs. She ascends/descends the stairs at home as often as she can without using her chair stairlift; 04/01/22: Pt demonstrates good maintenance with stairs. She  ascends/descends the stairs at home as often as she can without using her chair stairlift, pt also performs stairs in the community; 06/21/22: Pt and family reporting increased difficulty and fatigue when performing stairs at home. Pt reports that she is having more DOE; 07/27/22: Pt and family reporting more difficulty and fatigue when performing stairs at home as well as worsening DOE; 10/28/22: Pt reports she continues have difficulty with steps due to balance and strength deficits; 03/09/23: Pt continues with difficulty with steps   Time 12    Period Weeks    Status On-going    Target Date 05/31/2023      PT LONG TERM GOAL #4   Title Patient will complete a TUG test in no more than 13.5 seconds for independent mobility and decreased fall risk maintenance    Baseline 12/11/20: 13.4s; 03/02/21: 13.5s; 06/11/21: 11.4s; 08/20/21: 12.35s; 11/12/21: 12.2s; 12/21/21: 12.0s; 04/01/22: 11.3s; 06/21/22: 13.4s; 07/27/22: 13.3s; 10/28/22: 13.4s; 03/08/23: 12.2s   Time 12    Period Weeks    Status On-going    Target Date 05/31/2023     PT LONG TERM GOAL #5   Title Patient will increase fastest 10 meter walk test to >0.81 m/s as to improve gait speed for better community ambulation and to reduce fall risk.    Baseline 12/11/20: 14.6s = 0.68 m/s; 03/02/21: 14.3s = 0.70 m/s; 06/11/21: 13.2s = 0.76 m/s; 08/20/21: 13.5s= 0.74 m/s; 11/12/21: 14.4s = 0.69 m/s; 12/21/21: 14.1s = 0.71 m/s; 04/01/22: 13.7s = 0.73 m/s; 06/21/22: 14.1s = 0.71 m/s; 07/27/22: 14.0s =  0.71 m/s; 10/28/22: 14.4s =  0.69 m/s; 03/08/23: 14.3s =  0.70 m/s   Time 12    Period Weeks    Status Partially Met    Target Date 05/31/2023              Plan     Clinical Impression Statement Pt demonstrates excellent motivation during session today and is able to participate in all therapeutic interventions during session as instructed. Session focused on both strengthening and balance exercises.  Challenged balance with dual motor tasks as well as dynamic reaching.  Repeated strengthening on Total Gym and performed jumps for power production. Discharge attempted at the end of 2021 and pt demonstrated a drop in her function during her prolonged absence out of therapy despite good consistency with her HEP. Frequency was also decreased from every other week to once per month in an attempt to  maintain as much as possible and unfortunately this resulted in an unacceptable decline in patient's function. Plan is to maintain frequency at every other week throughout 2024 in order to maintain her function. Pt is regularly exercising at home with the assistance of family as needed/appropriate. She is unable to complete all necessary exercises safely at home either independently or with family assistance due to need from physical therapist to monitor and correct form and technique as well as to provide safe guarding. Provided continued reinforcement regarding the importance of consistency with HEP and therapy sessions will focus on appropriate modifications to her HEP, family education, and maintenance of her function. Therapist will reassess patient's outcome measures at regular intervals to assess need for continued maintenance therapy frequency. Pt requires physical therapy intervention to maintain her functional status including LE strength, balance, and gait speed/quality and she has not achieved maximal benefit from therapy services. She will benefit from continued PT services to address these deficits and maintain her function at home and school.     Personal Factors and Comorbidities Comorbidity 1;Past/Current Experience;Time since onset of injury/illness/exacerbation;Transportation;Social Background    Comorbidities spina bifida    Examination-Activity Limitations Bend;Carry;Continence;Lift;Reach Overhead;Squat;Stairs;Stand;Transfers    Examination-Participation Restrictions Church;Community Activity;Driving;Laundry;Meal Prep;Volunteer;Yard Work    Stability/Clinical Decision  Making Stable/Uncomplicated    Rehab Potential Good    Clinical Impairments Affecting Rehab Potential weakness and decreased standing balance    PT Frequency 1 visit every other week   PT Duration 12 weeks    PT Treatment/Interventions ADLs/Self Care Home Management;Aquatic Therapy;Biofeedback; Moist Heat;Electrical Stimulation;DME Instruction;Gait training;Stair training;Functional mobility training;Neuromuscular re-education;Balance training;Therapeutic exercise;Therapeutic activities;Patient/family education;Orthotic Fit/Training;;Manual techniques;Passive range of motion;Energy conservation;Splinting;Taping;Visual/perceptual remediation/compensation;Cryotherapy    PT Next Visit Plan review and modify HEP as appropriate, balance and strength exercises    PT Home Exercise Plan Seated, supine, and core program: [email or web address]    Consulted and Agree with Plan of Care Patient             Lynnea Maizes PT, DPT, [facility] Kenady Doxtater, PT 04/20/2023, 10:47 AM

## 2023-04-18 ENCOUNTER — Ambulatory Visit: Payer: Medicaid Other | Attending: Student

## 2023-04-18 DIAGNOSIS — R2681 Unsteadiness on feet: Secondary | ICD-10-CM | POA: Diagnosis present

## 2023-04-18 DIAGNOSIS — M6281 Muscle weakness (generalized): Secondary | ICD-10-CM

## 2023-04-25 ENCOUNTER — Encounter: Payer: Self-pay | Admitting: Podiatry

## 2023-04-25 ENCOUNTER — Ambulatory Visit (INDEPENDENT_AMBULATORY_CARE_PROVIDER_SITE_OTHER): Payer: Medicaid Other | Admitting: Podiatry

## 2023-04-25 DIAGNOSIS — L603 Nail dystrophy: Secondary | ICD-10-CM | POA: Diagnosis not present

## 2023-04-26 NOTE — Progress Notes (Signed)
  Subjective:  Patient ID: Michelle Randall, female    DOB: 05-04-1996,  MRN: [id number]  Chief Complaint  Patient presents with   Nail Problem    NP Toenail bleeding - toenails are curving over toes - She has spina bifida and has worn AFO braces all her life - she does not have much feeling in her feet - she has a birthmark on the bottom of her right foot, it's always been there and has not changed    27 y.o. female presents with the above complaint. History confirmed with patient.   Objective:  Physical Exam: warm, good capillary refill, no trophic changes or ulcerative lesions, normal DP and PT pulses, normal sensory exam, and multiple elongated dystrophic nails, benign-appearing pigmented macules plantar midfoot   Assessment:   1. Nail dystrophy      Plan:  Patient was evaluated and treated and all questions answered.   Discussed possible etiologies of nail dystrophy including nail fungus.  Culture of the nail plate of the right second toe was taken.  We will treat accordingly, will let her know by phone or MyChart with the results of the nail biopsy.     Return if symptoms worsen or fail to improve.

## 2023-05-02 ENCOUNTER — Ambulatory Visit: Payer: Medicaid Other

## 2023-05-02 DIAGNOSIS — M6281 Muscle weakness (generalized): Secondary | ICD-10-CM

## 2023-05-02 DIAGNOSIS — R2681 Unsteadiness on feet: Secondary | ICD-10-CM

## 2023-05-02 NOTE — Therapy (Signed)
OUTPATIENT PHYSICAL THERAPY TREATMENT NOTE/PROGRESS NOTE  Dates of reporting period  12/02/22   to   05/02/23   Patient Name: Michelle Randall MRN: [id number] DOB:[birth year], 27 y.o., female Today's Date: 05/03/2023  PCP: Carren Rang, PA-C REFERRING PROVIDER: Carren Rang, PA-C   PT End of Session - 05/02/23 1409     Visit Number 60    Number of Visits 82    Date for PT Re-Evaluation 05/31/23    Authorization Type eval: 12/11/20, recert: 03/08/23    Authorization Time Period [facility] auth 5/7-7/29 for 6 PT visits, Medicaid  VL: 27 combined PT/OT/SLP per year (12/27) Auth: is required    Authorization - Visit Number 4    Authorization - Number of Visits 6    PT Start Time 1400    PT Stop Time 1445    PT Time Calculation (min) 45 min    Equipment Utilized During Treatment Gait belt   Bilateral AFOs, lofstrand crutches   Activity Tolerance Patient tolerated treatment well    Behavior During Therapy WFL for tasks assessed/performed            Past Medical History:  Diagnosis Date   Neurogenic bladder    Neurogenic bowel    S/P VP shunt    Spina bifida (HCC)    Past Surgical History:  Procedure Laterality Date   VENTRICULOPERITONEAL SHUNT     Patient Active Problem List   Diagnosis Date Noted   Hydrocephalus with operating shunt (HCC) 03/17/2023   Malfunction of ventriculo-peritoneal shunt (HCC) 07/17/2018   Attention deficit 08/02/2017   Urinary incontinence 05/31/2016   Spina bifida (HCC) 07/29/2014   Neurogenic bladder 05/07/2013   Neurogenic bowel 04/29/2011   REFERRING DIAG: R26.81 (ICD-10-CM) - Unsteadiness on feet  THERAPY DIAG: Muscle weakness (generalized)  Unsteadiness on feet  PERTINENT HISTORY: Patient is a pleasant 27 year old female who presents for continuation of care for generalized weakness/ coordination deficits secondary to diagnosis of spina bifida. PMH includes VP shunt, spina bifida, and neurogenic bowel/bladder. Patient was discharged  from this clinic at the end of 2021 due to meeting therapy cap for insurance that year. Patient reports that she has continued performing her HEP but notes a decline when she stops therapy due to inability to safely perform all of the balance and strength exercises as at home. One of the biggest challenge she is experiencing is stair navigation She reports having multiple near falls when negotiating stairs at home. Patient must ascend/descend stairs to reach bedroom upstairs increasing her risk for falls daily.   PRECAUTIONS: Fall risk  OUTCOME MEASURES: TEST 12/11/20 03/02/21 06/11/21 08/20/21 11/12/21 12/21/21 04/01/22 06/21/22 07/27/22 10/28/22 03/08/23 Interpretation  5 times sit<>stand 10.6s 9.6s 8.1s Not Tested 7.4s 8.2s 8.3s 8.6s 7.7s 7.7s 7.6s WNL  10 meter walk test 14.6s = 0.68 m/s 14.3s = 0.70 m/s 13.2s =  0.76 m/s 13.5s=0.74 m/s 14.4s = 0.69 m/s 14.1s = 0.71 m/s 13.7s = 0.73 m/s 14.1s = 0.71 m/s 14.0s =  0.71 m/s 14.4s =  0.69 m/s 14.3s =  0.70 m/s <1.0 m/s indicates increased risk for falls; limited community ambulator  Timed up and Go 13.4s 13.5s 11.4s 12.35s 12.2s 12.0s 11.3s 13.4s 13.3s 13.4s 12.2s WNL, use of lofstrand crutches, >14 sec indicates increased risk for falls  2 minute walk test [employer] Def[employer] 264' with lofstrand crutches (post: HR: 150, 99%, BORG: 5/10) 233' with lofstrand crutches (post: HR: 145, 97%, BORG: 5/10) 258' with lofstrand crutches (  post: BP: 123/74, HR: 130, 100%, BORG: 6/10) 230' with lofstrand crutches (post:, HR: 141, 100%, BORG: 6/10) 1000 feet is community ambulator  BERG Balance Assessment 32/56 32/56 33/56    34/56 34/56 34/56  35/56 31/56 29/56  33/56 33/56  <36/56 (100% risk for falls), 37-45 (80% risk for falls); 46-51 (>50% risk for falls); 52-55 (lower risk <25% of falls)  FOTO 51 49 53 53 49 50 45 47 45 50  49 Goal is to reach 59     TODAY'S TREATMENT:      SUBJECTIVE: Pt reports that she is doing well today.  No changes since the last therapy session. All DME is functioning properly. No reported falls and no specific questions or concerns at this time.   PAIN:  Are you having pain? No   TREATMENT  Ther-ex  NuStep L1-4 BLE only x 10 minutes for warm-up during interval history intake (5 minutes unbilled); Sit to stands from chair without UE support 2 x 15; Lunge walks forward in // bars x multiple lengths; High knee marches in // bars x multiple lengths;     Neuromuscular Re-education  Forward gait in // bars with single LUE support and balancing ball on top of agility cone in opposite UE for dual motor task x multiple lengths; Sidestepping in // bars with single LUE support and balancing ball on top of agility cone in opposite UE for dual motor task x 1 length each direction; Airex feet apart (FA) static balance eyes open x 30s; Airex FA eyes closed x 30s; Airex staggered balance with front foot on 6" step x 60s on each side; Airex feet apart dynamic reaching in multiple planes and crossing midline x multiple trials on each UE; Reinforced continued performance of HEP;   Not performed: BOSU (round side up) step-ups alternating leading LE with BUE support x 10 on each side; Seated LAQ with manual resistance from therapist x 10 BLE; Split squats alternating forward LE x 10 on each side; Airex feet apart dynamic cone passes with therapist adjusting height and distance from patient as well as challenging pt to cross midline x multiple bouts on each side; Nautilus resisted gait with 20# forward, backward, R lateral, and L lateral x 3 each direction, pt using Lofstrand crutches; Seated adductor squeeze with manual resistance x 10 BLE; Seated alternating LAQ with manual resistance x 10 BLE; Total Gym (TG) Level 22 (L22) double leg squats x 10; TG L22 single leg squats 2 x 10 BLE; TG L22 double leg jumps 2 x 10; 6" forward step ups alternating leading LE with BUE support x 10 on each  side; Forward gait stepping over 1" boards in // bars with single UE support x multiple laps;   PATIENT EDUCATION: Education details: Pt educated throughout session about proper posture and technique with exercises. Improved exercise technique, movement at target joints, use of target muscles after min to mod verbal, visual, tactile cues. Balance exercises Person educated: Patient Education method: Explanation and Demonstration Education comprehension: verbalized understanding and returned demonstration   HOME EXERCISE PROGRAM: Seated, supine, and core program: Medbridge Access Code: [email or web address]      PT Short Term Goals       PT SHORT TERM GOAL #1   Title Patient will report compliance with HEP for continued strengthening and stability during functional mobility.     Time 6    Period Weeks    Status Achieved              PT Long  Term Goals      PT LONG TERM GOAL #1   Title Patient will increase her FOTO score to at least 59 to demonstrate increased function and mobility for higher quality of life.    Baseline 12/11/20: 51; 03/02/21: 49; 06/11/21: 53; 08/20/21: 53; 11/12/21: 49; 12/21/21: 50/ 04/01/22: 45; 06/21/22: 47; 07/27/22: 45; 10/28/22: 50; 03/08/23: 49   Time 12    Period Weeks    Status Partially Met    Target Date 05/31/2023     PT LONG TERM GOAL #2   Title Patient will maintain Berg Balance score of at least 32/56 in order to demonstrate decreased fall risk during functional activities.    Baseline 12/11/20: 32/56; 03/02/21: 32/56; 06/11/21: 16/10; 08/20/2021: 34/56; 11/12/21: 34/56; 12/22/21: 34/56; 04/01/22: 35/56; 06/21/22: 31/56; 07/27/22: 29/56; 10/28/22: 33/56; 03/08/23: 33/56   Time 12    Period Weeks    Status On-going    Target Date 05/31/2023     PT LONG TERM GOAL #3   Title Patient will be able to maintain safe performance with ascending/descending a flight of stairs using lofstrand crutches and railing in order to allow for safe navigation of her home environment.     Baseline 12/11/20: currently able to use lofstrands and/or railing with supervision by family; 03/02/21: Good maintenance observed; 06/11/21: Good maintenance observed; 08/20/2021: Good maintenance observed; 11/13/21: Good maintenance observed, family reports it continues to be the most challenging activity for patient; 12/22/21: Pt demonstrates good maintenance with stairs. She ascends/descends the stairs at home as often as she can without using her chair stairlift; 04/01/22: Pt demonstrates good maintenance with stairs. She ascends/descends the stairs at home as often as she can without using her chair stairlift, pt also performs stairs in the community; 06/21/22: Pt and family reporting increased difficulty and fatigue when performing stairs at home. Pt reports that she is having more DOE; 07/27/22: Pt and family reporting more difficulty and fatigue when performing stairs at home as well as worsening DOE; 10/28/22: Pt reports she continues have difficulty with steps due to balance and strength deficits; 03/09/23: Pt continues with difficulty with steps   Time 12    Period Weeks    Status On-going    Target Date 05/31/2023      PT LONG TERM GOAL #4   Title Patient will complete a TUG test in no more than 13.5 seconds for independent mobility and decreased fall risk maintenance    Baseline 12/11/20: 13.4s; 03/02/21: 13.5s; 06/11/21: 11.4s; 08/20/21: 12.35s; 11/12/21: 12.2s; 12/21/21: 12.0s; 04/01/22: 11.3s; 06/21/22: 13.4s; 07/27/22: 13.3s; 10/28/22: 13.4s; 03/08/23: 12.2s   Time 12    Period Weeks    Status On-going    Target Date 05/31/2023     PT LONG TERM GOAL #5   Title Patient will increase fastest 10 meter walk test to >0.81 m/s as to improve gait speed for better community ambulation and to reduce fall risk.    Baseline 12/11/20: 14.6s = 0.68 m/s; 03/02/21: 14.3s = 0.70 m/s; 06/11/21: 13.2s = 0.76 m/s; 08/20/21: 13.5s= 0.74 m/s; 11/12/21: 14.4s = 0.69 m/s; 12/21/21: 14.1s = 0.71 m/s; 04/01/22: 13.7s = 0.73 m/s; 06/21/22:  14.1s = 0.71 m/s; 07/27/22: 14.0s =  0.71 m/s; 10/28/22: 14.4s =  0.69 m/s; 03/08/23: 14.3s =  0.70 m/s   Time 12    Period Weeks    Status Partially Met    Target Date 05/31/2023              Plan  Clinical Impression Statement Pt demonstrates excellent motivation during session today and is able to participate in all therapeutic interventions during session as instructed. Session focused on both strengthening and balance exercises with greater focus on dynamic balance.  Challenged balance with dual motor tasks as well as dynamic reaching. Plan to update outcome measures/goals at upcoming session. Discharge attempted at the end of 2021 and pt demonstrated a drop in her function during her prolonged absence out of therapy despite good consistency with her HEP. Frequency was also decreased from every other week to once per month in an attempt to maintain as much as possible and unfortunately this resulted in an unacceptable decline in patient's function. Plan is to maintain frequency at every other week throughout 2024 in order to maintain her function. Pt is regularly exercising at home with the assistance of family as needed/appropriate. She is unable to complete all necessary exercises safely at home either independently or with family assistance due to need from physical therapist to monitor and correct form and technique as well as to provide safe guarding. Provided continued reinforcement regarding the importance of consistency with HEP and therapy sessions will focus on appropriate modifications to her HEP, family education, and maintenance of her function. Therapist will reassess patient's outcome measures at regular intervals to assess need for continued maintenance therapy frequency. Pt requires physical therapy intervention to maintain her functional status including LE strength, balance, and gait speed/quality and she has not achieved maximal benefit from therapy services. She will benefit  from continued PT services to address these deficits and maintain her function at home and school.     Personal Factors and Comorbidities Comorbidity 1;Past/Current Experience;Time since onset of injury/illness/exacerbation;Transportation;Social Background    Comorbidities spina bifida    Examination-Activity Limitations Bend;Carry;Continence;Lift;Reach Overhead;Squat;Stairs;Stand;Transfers    Examination-Participation Restrictions Church;Community Activity;Driving;Laundry;Meal Prep;Volunteer;Yard Work    Stability/Clinical Decision Making Stable/Uncomplicated    Rehab Potential Good    Clinical Impairments Affecting Rehab Potential weakness and decreased standing balance    PT Frequency 1 visit every other week   PT Duration 12 weeks    PT Treatment/Interventions ADLs/Self Care Home Management;Aquatic Therapy;Biofeedback; Moist Heat;Electrical Stimulation;DME Instruction;Gait training;Stair training;Functional mobility training;Neuromuscular re-education;Balance training;Therapeutic exercise;Therapeutic activities;Patient/family education;Orthotic Fit/Training;;Manual techniques;Passive range of motion;Energy conservation;Splinting;Taping;Visual/perceptual remediation/compensation;Cryotherapy    PT Next Visit Plan review and modify HEP as appropriate, balance and strength exercises    PT Home Exercise Plan Seated, supine, and core program: [email or web address]    Consulted and Agree with Plan of Care Patient             Lynnea Maizes PT, DPT, [facility] Hikaru Delorenzo, PT 05/03/2023, 10:06 PM

## 2023-05-17 ENCOUNTER — Ambulatory Visit: Payer: Medicaid Other | Attending: Student

## 2023-05-17 DIAGNOSIS — M6281 Muscle weakness (generalized): Secondary | ICD-10-CM | POA: Insufficient documentation

## 2023-05-17 DIAGNOSIS — R2681 Unsteadiness on feet: Secondary | ICD-10-CM | POA: Diagnosis present

## 2023-05-17 NOTE — Therapy (Signed)
OUTPATIENT PHYSICAL THERAPY TREATMENT NOTE/GOAL UPDATE   Patient Name: Michelle Randall MRN: [id number] DOB:[birth year], 27 y.o., female Today's Date: 05/19/2023  PCP: Carren Rang, PA-C REFERRING PROVIDER: Carren Rang, PA-C   PT End of Session - 05/19/23 1118     Visit Number 61    Number of Visits 82    Date for PT Re-Evaluation 05/31/23    Authorization Type eval: 12/11/20, recert: 03/08/23    Authorization Time Period [employer] auth 5/7-7/29 for 6 PT visits, Medicaid  VL: 27 combined PT/OT/SLP per year (12/27) Auth: is required    Authorization - Visit Number 5    Authorization - Number of Visits 6    PT Start Time 1147    PT Stop Time 1230    PT Time Calculation (min) 43 min    Equipment Utilized During Treatment Gait belt   Bilateral AFOs, lofstrand crutches   Activity Tolerance Patient tolerated treatment well    Behavior During Therapy WFL for tasks assessed/performed            Past Medical History:  Diagnosis Date   Neurogenic bladder    Neurogenic bowel    S/P VP shunt    Spina bifida (HCC)    Past Surgical History:  Procedure Laterality Date   VENTRICULOPERITONEAL SHUNT     Patient Active Problem List   Diagnosis Date Noted   Hydrocephalus with operating shunt (HCC) 03/17/2023   Malfunction of ventriculo-peritoneal shunt (HCC) 07/17/2018   Attention deficit 08/02/2017   Urinary incontinence 05/31/2016   Spina bifida (HCC) 07/29/2014   Neurogenic bladder 05/07/2013   Neurogenic bowel 04/29/2011   REFERRING DIAG: R26.81 (ICD-10-CM) - Unsteadiness on feet  THERAPY DIAG: Muscle weakness (generalized)  Unsteadiness on feet  PERTINENT HISTORY: Patient is a pleasant 27 year old female who presents for continuation of care for generalized weakness/ coordination deficits secondary to diagnosis of spina bifida. PMH includes VP shunt, spina bifida, and neurogenic bowel/bladder. Patient was discharged from this clinic at the end of 2021 due to meeting  therapy cap for insurance that year. Patient reports that she has continued performing her HEP but notes a decline when she stops therapy due to inability to safely perform all of the balance and strength exercises as at home. One of the biggest challenge she is experiencing is stair navigation She reports having multiple near falls when negotiating stairs at home. Patient must ascend/descend stairs to reach bedroom upstairs increasing her risk for falls daily.   PRECAUTIONS: Fall risk  OUTCOME MEASURES: TEST 12/11/20 03/02/21 06/11/21 08/20/21 11/12/21 12/21/21 04/01/22 06/21/22 07/27/22 10/28/22 03/08/23 Interpretation  5 times sit<>stand 10.6s 9.6s 8.1s Not Tested 7.4s 8.2s 8.3s 8.6s 7.7s 7.7s 7.6s WNL  10 meter walk test 14.6s = 0.68 m/s 14.3s = 0.70 m/s 13.2s =  0.76 m/s 13.5s=0.74 m/s 14.4s = 0.69 m/s 14.1s = 0.71 m/s 13.7s = 0.73 m/s 14.1s = 0.71 m/s 14.0s =  0.71 m/s 14.4s =  0.69 m/s 14.3s =  0.70 m/s <1.0 m/s indicates increased risk for falls; limited community ambulator  Timed up and Go 13.4s 13.5s 11.4s 12.35s 12.2s 12.0s 11.3s 13.4s 13.3s 13.4s 12.2s WNL, use of lofstrand crutches, >14 sec indicates increased risk for falls  2 minute walk test [employer] Def[employer] 264' with lofstrand crutches (post: HR: 150, 99%, BORG: 5/10) 233' with lofstrand crutches (post: HR: 145, 97%, BORG: 5/10) 258' with lofstrand crutches (post: BP: 123/74, HR: 130, 100%, BORG: 6/10) 230' with lofstrand crutches (post:,  HR: 141, 100%, BORG: 6/10) 1000 feet is community ambulator  BERG Balance Assessment 32/56 32/56 33/56    34/56 34/56 34/56  35/56 31/56 29/56  33/56 33/56  <36/56 (100% risk for falls), 37-45 (80% risk for falls); 46-51 (>50% risk for falls); 52-55 (lower risk <25% of falls)  FOTO 51 49 53 53 49 50 45 47 45 50  49 Goal is to reach 59     TODAY'S TREATMENT:      SUBJECTIVE: Pt reports that she is doing well today. No changes since the last therapy session. All DME  is functioning properly. Work is going well and her family returned from their [place] trip. No reported falls and no specific questions or concerns at this time.   PAIN:  Are you having pain? No   Neuromuscular Re-education NuStep L1-2 BLE only x 10 minutes for warm-up during interval history intake (5 minutes unbilled);  Updated outcome measures with patient:  OUTCOME MEASURES: TEST 12/11/20 03/02/21 06/11/21 08/20/21 11/12/21 12/21/21 04/01/22 06/21/22 07/27/22 10/28/22 03/08/23 05/17/23 Interpretation  5 times sit<>stand 10.6s 9.6s 8.1s Not Tested 7.4s 8.2s 8.3s 8.6s 7.7s 7.7s 7.6s 7.7s WNL  10 meter walk test 14.6s = 0.68 m/s 14.3s = 0.70 m/s 13.2s =  0.76 m/s 13.5s=0.74 m/s 14.4s = 0.69 m/s 14.1s = 0.71 m/s 13.7s = 0.73 m/s 14.1s = 0.71 m/s 14.0s =  0.71 m/s 14.4s =  0.69 m/s 14.3s =  0.70 m/s 13.4s =  0.75 m/s <1.0 m/s indicates increased risk for falls; limited community ambulator  Timed up and Go 13.4s 13.5s 11.4s 12.35s 12.2s 12.0s 11.3s 13.4s 13.3s 13.4s 12.2s 12.3s WNL, use of lofstrand crutches, >14 sec indicates increased risk for falls  2 minute walk test [employer] Def[employer] 264' with lofstrand crutches (post: HR: 150, 99%, BORG: 5/10) 233' with lofstrand crutches (post: HR: 145, 97%, BORG: 5/10) 258' with lofstrand crutches (post: BP: 123/74, HR: 130, 100%, BORG: 6/10) 230' with lofstrand crutches (post:, HR: 141, 100%, BORG: 6/10) 249' with lofstrand crutches (post: HR: 131 bpm, SpO2: 98%, BORG: 5/10) 1000 feet is community ambulator  BERG Balance Assessment 32/56 32/56 33/56    34/56 34/56 34/56  35/56 31/56 29/56  33/56 33/56  33/56 <36/56 (100% risk for falls), 37-45 (80% risk for falls); 46-51 (>50% risk for falls); 52-55 (lower risk <25% of falls)  FOTO 51 49 53 53 49 50 45 47 45 50  49 51 Goal is to reach 59    Airex feet apart (FA) static balance eyes open x 30s; Airex FA eyes closed x 30s; Airex FA eyes open with horizontal and vertical  head turns x 30s each; Reinforced continued performance of HEP;   PATIENT EDUCATION: Education details: Pt educated throughout session about proper posture and technique with exercises. Improved exercise technique, movement at target joints, use of target muscles after min to mod verbal, visual, tactile cues. Balance exercises Person educated: Patient Education method: Explanation and Demonstration Education comprehension: verbalized understanding and returned demonstration   HOME EXERCISE PROGRAM: Seated, supine, and core program: Medbridge Access Code: [email or web address]      PT Short Term Goals       PT SHORT TERM GOAL #1   Title Patient will report compliance with HEP for continued strengthening and stability during functional mobility.     Time 6    Period Weeks    Status Achieved              PT Long Term Goals      PT  LONG TERM GOAL #1   Title Patient will increase her FOTO score to at least 59 to demonstrate increased function and mobility for higher quality of life.    Baseline 12/11/20: 51; 03/02/21: 49; 06/11/21: 53; 08/20/21: 53; 11/12/21: 49; 12/21/21: 50/ 04/01/22: 45; 06/21/22: 47; 07/27/22: 45; 10/28/22: 50; 03/08/23: 49; 05/17/23: 51   Time 12    Period Weeks    Status Partially Met    Target Date 05/31/2023     PT LONG TERM GOAL #2   Title Patient will maintain Berg Balance score of at least 32/56 in order to demonstrate decreased fall risk during functional activities.    Baseline 12/11/20: 32/56; 03/02/21: 32/56; 06/11/21: 33/56; 08/20/2021: 07/37; 11/12/21: 34/56; 12/22/21: 34/56; 04/01/22: 35/56; 06/21/22: 31/56; 07/27/22: 29/56; 10/28/22: 33/56; 03/08/23: 33/56; 05/17/23: 33/56;   Time 12    Period Weeks    Status On-going    Target Date 05/31/2023     PT LONG TERM GOAL #3   Title Patient will be able to maintain safe performance with ascending/descending a flight of stairs using lofstrand crutches and railing in order to allow for safe navigation of her home environment.     Baseline 12/11/20: currently able to use lofstrands and/or railing with supervision by family; 03/02/21: Good maintenance observed; 06/11/21: Good maintenance observed; 08/20/2021: Good maintenance observed; 11/13/21: Good maintenance observed, family reports it continues to be the most challenging activity for patient; 12/22/21: Pt demonstrates good maintenance with stairs. She ascends/descends the stairs at home as often as she can without using her chair stairlift; 04/01/22: Pt demonstrates good maintenance with stairs. She ascends/descends the stairs at home as often as she can without using her chair stairlift, pt also performs stairs in the community; 06/21/22: Pt and family reporting increased difficulty and fatigue when performing stairs at home. Pt reports that she is having more DOE; 07/27/22: Pt and family reporting more difficulty and fatigue when performing stairs at home as well as worsening DOE; 10/28/22: Pt reports she continues have difficulty with steps due to balance and strength deficits; 03/09/23: Pt continues with difficulty with steps; 05/19/23: Unchanged, continued difficulty although performs daily;   Time 12    Period Weeks    Status On-going    Target Date 05/31/2023      PT LONG TERM GOAL #4   Title Patient will complete a TUG test in no more than 13.5 seconds for independent mobility and decreased fall risk maintenance    Baseline 12/11/20: 13.4s; 03/02/21: 13.5s; 06/11/21: 11.4s; 08/20/21: 12.35s; 11/12/21: 12.2s; 12/21/21: 12.0s; 04/01/22: 11.3s; 06/21/22: 13.4s; 07/27/22: 13.3s; 10/28/22: 13.4s; 03/08/23: 12.2s; 05/17/23: 12.3s   Time 12    Period Weeks    Status On-going    Target Date 05/31/2023     PT LONG TERM GOAL #5   Title Patient will increase fastest 10 meter walk test to >0.81 m/s as to improve gait speed for better community ambulation and to reduce fall risk.    Baseline 12/11/20: 14.6s = 0.68 m/s; 03/02/21: 14.3s = 0.70 m/s; 06/11/21: 13.2s = 0.76 m/s; 08/20/21: 13.5s= 0.74 m/s;  11/12/21: 14.4s = 0.69 m/s; 12/21/21: 14.1s = 0.71 m/s; 04/01/22: 13.7s = 0.73 m/s; 06/21/22: 14.1s = 0.71 m/s; 07/27/22: 14.0s =  0.71 m/s; 10/28/22: 14.4s =  0.69 m/s; 03/08/23: 14.3s =  0.70 m/s; 05/17/23: 13.4s = 0.75 m/s   Time 12    Period Weeks    Status Partially Met    Target Date 05/31/2023  Plan     Clinical Impression Statement Pt demonstrates excellent motivation today. Updated outcome measures and goals during session. Her TUG, BERG, and 5TSTS all remained consistent today compared to the last update (12.3s, 33/56, and 7.7s respectively). Her 105m gait speed is slightly faster and she ambulated 19' farther during the . Patient's FOTO increased to 51. Overall pt showed improvement in some outcome measures and for other demonstrated excellent maintenance. She continues to report more difficulty with stairs but performs them at least once per day instead of using the stair lift. Discharge attempted at the end of 2021 and pt demonstrated a drop in her function during her prolonged absence out of therapy despite good consistency with her HEP. Frequency was also decreased from every other week to once per month to find the minimum effective frequency, however, unfortunately this resulted in an unacceptable decline in patient's function as well. Plan is to continue frequency of one therapy session every other week throughout 2024 in order to maintain her function. Pt is regularly exercising at home with the assistance of family as needed/appropriate. She is unable to complete all necessary exercises safely at home either independently or with family assistance due to need from physical therapist to monitor and correct form and technique as well as to provide safe guarding. Provided continued reinforcement regarding the importance of consistency with HEP and therapy sessions will focus on appropriate modifications to her HEP, family education, and maintenance of her function. Therapist will  reassess patient's outcome measures at regular intervals to assess need for continued maintenance therapy frequency. Pt requires physical therapy intervention to maintain her functional status including LE strength, balance, and gait speed/quality and she has not achieved maximal benefit from therapy services. She will benefit from continued PT services to address these deficits and maintain her function at home and school.    Personal Factors and Comorbidities Comorbidity 1;Past/Current Experience;Time since onset of injury/illness/exacerbation;Transportation;Social Background    Comorbidities spina bifida    Examination-Activity Limitations Bend;Carry;Continence;Lift;Reach Overhead;Squat;Stairs;Stand;Transfers    Examination-Participation Restrictions Church;Community Activity;Driving;Laundry;Meal Prep;Volunteer;Yard Work    Stability/Clinical Decision Making Stable/Uncomplicated    Rehab Potential Good    Clinical Impairments Affecting Rehab Potential weakness and decreased standing balance    PT Frequency 1 visit every other week   PT Duration 12 weeks    PT Treatment/Interventions ADLs/Self Care Home Management;Aquatic Therapy;Biofeedback; Moist Heat;Electrical Stimulation;DME Instruction;Gait training;Stair training;Functional mobility training;Neuromuscular re-education;Balance training;Therapeutic exercise;Therapeutic activities;Patient/family education;Orthotic Fit/Training;;Manual techniques;Passive range of motion;Energy conservation;Splinting;Taping;Visual/perceptual remediation/compensation;Cryotherapy    PT Next Visit Plan review and modify HEP as appropriate, balance and strength exercises    PT Home Exercise Plan Seated, supine, and core program: [email or web address]    Consulted and Agree with Plan of Care Patient             Lynnea Maizes PT, DPT, [facility] Lucerito Rosinski, PT 05/19/2023, 11:38 AM

## 2023-05-25 ENCOUNTER — Other Ambulatory Visit: Payer: Self-pay

## 2023-05-25 DIAGNOSIS — L603 Nail dystrophy: Secondary | ICD-10-CM

## 2023-06-02 ENCOUNTER — Ambulatory Visit: Payer: Medicaid Other

## 2023-06-02 DIAGNOSIS — M6281 Muscle weakness (generalized): Secondary | ICD-10-CM | POA: Diagnosis not present

## 2023-06-02 DIAGNOSIS — R2681 Unsteadiness on feet: Secondary | ICD-10-CM

## 2023-06-02 NOTE — Therapy (Signed)
OUTPATIENT PHYSICAL THERAPY TREATMENT NOTE/RECERTIFICATION   Patient Name: Michelle Randall MRN: [id number] DOB:1996-02-10, 27 y.o., female Today's Date: 06/04/2023  PCP: Carren Rang, PA-C REFERRING PROVIDER: Carren Rang, PA-C   PT End of Session - 06/04/23 1115     Visit Number 62    Number of Visits 88    Date for PT Re-Evaluation 08/25/23    Authorization Type eval: 12/11/20, recert: 03/08/23, 06/02/23    Authorization Time Period [employer] auth 5/7-7/29 for 6 PT visits, Medicaid  VL: 27 combined PT/OT/SLP per year (12/27) Auth: is required    Authorization - Visit Number 6    Authorization - Number of Visits 6    PT Start Time 1447    PT Stop Time 1530    PT Time Calculation (min) 43 min    Equipment Utilized During Treatment Gait belt   Bilateral AFOs, lofstrand crutches   Activity Tolerance Patient tolerated treatment well    Behavior During Therapy WFL for tasks assessed/performed            Past Medical History:  Diagnosis Date   Neurogenic bladder    Neurogenic bowel    S/P VP shunt    Spina bifida (HCC)    Past Surgical History:  Procedure Laterality Date   VENTRICULOPERITONEAL SHUNT     Patient Active Problem List   Diagnosis Date Noted   Hydrocephalus with operating shunt (HCC) 03/17/2023   Malfunction of ventriculo-peritoneal shunt (HCC) 07/17/2018   Attention deficit 08/02/2017   Urinary incontinence 05/31/2016   Spina bifida (HCC) 07/29/2014   Neurogenic bladder 05/07/2013   Neurogenic bowel 04/29/2011   REFERRING DIAG: R26.81 (ICD-10-CM) - Unsteadiness on feet  THERAPY DIAG: Muscle weakness (generalized)  Unsteadiness on feet  PERTINENT HISTORY: Patient is a pleasant 27 year old female who presents for continuation of care for generalized weakness/ coordination deficits secondary to diagnosis of spina bifida. PMH includes VP shunt, spina bifida, and neurogenic bowel/bladder. Patient was discharged from this clinic at the end of 2021 due to  meeting therapy cap for insurance that year. Patient reports that she has continued performing her HEP but notes a decline when she stops therapy due to inability to safely perform all of the balance and strength exercises as at home. One of the biggest challenge she is experiencing is stair navigation She reports having multiple near falls when negotiating stairs at home. Patient must ascend/descend stairs to reach bedroom upstairs increasing her risk for falls daily.   PRECAUTIONS: Fall risk  OUTCOME MEASURES: TEST 12/11/20 03/02/21 06/11/21 08/20/21 11/12/21 12/21/21 04/01/22 06/21/22 07/27/22 10/28/22 03/08/23 05/17/23 Interpretation  5 times sit<>stand 10.6s 9.6s 8.1s Not Tested 7.4s 8.2s 8.3s 8.6s 7.7s 7.7s 7.6s 7.7s WNL  10 meter walk test 14.6s = 0.68 m/s 14.3s = 0.70 m/s 13.2s =  0.76 m/s 13.5s=0.74 m/s 14.4s = 0.69 m/s 14.1s = 0.71 m/s 13.7s = 0.73 m/s 14.1s = 0.71 m/s 14.0s =  0.71 m/s 14.4s =  0.69 m/s 14.3s =  0.70 m/s 13.4s =  0.75 m/s <1.0 m/s indicates increased risk for falls; limited community ambulator  Timed up and Go 13.4s 13.5s 11.4s 12.35s 12.2s 12.0s 11.3s 13.4s 13.3s 13.4s 12.2s 12.3s WNL, use of lofstrand crutches, >14 sec indicates increased risk for falls  2 minute walk test [employer] Def[employer] 264' with lofstrand crutches (post: HR: 150, 99%, BORG: 5/10) 233' with lofstrand crutches (post: HR: 145, 97%, BORG: 5/10) 258' with lofstrand crutches (post: BP: 123/74, HR: 130,  100%, BORG: 6/10) 230' with lofstrand crutches (post:, HR: 141, 100%, BORG: 6/10) 249' with lofstrand crutches (post: HR: 131 bpm, SpO2: 98%, BORG: 5/10) 1000 feet is community ambulator  BERG Balance Assessment 32/56 32/56 33/56    34/56 34/56 34/56  35/56 31/56 29/56  33/56 33/56  33/56 <36/56 (100% risk for falls), 37-45 (80% risk for falls); 46-51 (>50% risk for falls); 52-55 (lower risk <25% of falls)  FOTO 51 49 53 53 49 50 45 47 45 50  49 51 Goal is to reach 59      TODAY'S TREATMENT:      SUBJECTIVE: Pt reports that she is doing well today. No changes since the last therapy session. All DME is functioning properly, however she did have to get the lift function fixed on her power wheelchair recently. Work is going well. No reported falls and no specific questions or concerns at this time.   PAIN:  Are you having pain? No   Ther-ex  NuStep L1-4 BLE only x 10 minutes for warm-up during interval history intake (5 minutes unbilled); Total Gym (TG) Level 22 (L22) double leg squats x 10; TG L22 single leg squats 2 x 10 BLE; TG L22 double leg jumps 2 x 10; 6" forward step ups alternating leading LE with BUE support x 10 on each side; Sit to stands from chair without UE support 2 x 20; Walking lunges in // bars with BUE support x 4 lengths;     Neuromuscular Re-education  Forward gait stepping over 1" boards in // bars with single UE support x multiple laps; Sidestepping in // bars with BUE support x 2 lengths each direction; Reinforced continued performance of HEP;   Not performed: BOSU (round side up) step-ups alternating leading LE with BUE support x 10 on each side; Seated LAQ with manual resistance from therapist x 10 BLE; Airex feet apart dynamic cone passes with therapist adjusting height and distance from patient as well as challenging pt to cross midline x multiple bouts on each side; Airex feet apart (FA) static balance eyes open x 30s; Airex FA eyes closed x 30s; Airex staggered balance with front foot on 6" step x 60s on each side; Airex FA eyes open with horizontal and vertical head turns x 30s each; Nautilus resisted gait with 20# forward, backward, R lateral, and L lateral x 3 each direction, pt using Lofstrand crutches; Seated adductor squeeze with manual resistance x 10; Seated alternating LAQ with manual resistance x 10 BLE; Forward/retro gait in // bars with single LUE support and balancing ball on top of agility cone in  opposite UE for dual motor task x 1 length each direction;    PATIENT EDUCATION: Education details: Pt educated throughout session about proper posture and technique with exercises. Improved exercise technique, movement at target joints, use of target muscles after min to mod verbal, visual, tactile cues. Balance exercises Person educated: Patient Education method: Explanation and Demonstration Education comprehension: verbalized understanding and returned demonstration   HOME EXERCISE PROGRAM: Seated, supine, and core program: Medbridge Access Code: [email or web address]      PT Short Term Goals       PT SHORT TERM GOAL #1   Title Patient will report compliance with HEP for continued strengthening and stability during functional mobility.     Time 6    Period Weeks    Status Achieved              PT Long Term Goals      PT LONG  TERM GOAL #1   Title Patient will increase her FOTO score to at least 59 to demonstrate increased function and mobility for higher quality of life.    Baseline 12/11/20: 51; 03/02/21: 49; 06/11/21: 53; 08/20/21: 53; 11/12/21: 49; 12/21/21: 50/ 04/01/22: 45; 06/21/22: 47; 07/27/22: 45; 10/28/22: 50; 03/08/23: 49; 05/17/23: 51   Time 12    Period Weeks    Status Partially Met    Target Date 08/25/23     PT LONG TERM GOAL #2   Title Patient will maintain Berg Balance score of at least 32/56 in order to demonstrate decreased fall risk during functional activities.    Baseline 12/11/20: 32/56; 03/02/21: 32/56; 06/11/21: 33/56; 08/20/2021: 16/10; 11/12/21: 34/56; 12/22/21: 34/56; 04/01/22: 35/56; 06/21/22: 31/56; 07/27/22: 29/56; 10/28/22: 33/56; 03/08/23: 33/56; 05/17/23: 33/56;   Time 12    Period Weeks    Status On-going    Target Date 08/25/23     PT LONG TERM GOAL #3   Title Patient will be able to maintain safe performance with ascending/descending a flight of stairs using lofstrand crutches and railing in order to allow for safe navigation of her home environment.    Baseline  12/11/20: currently able to use lofstrands and/or railing with supervision by family; 03/02/21: Good maintenance observed; 06/11/21: Good maintenance observed; 08/20/2021: Good maintenance observed; 11/13/21: Good maintenance observed, family reports it continues to be the most challenging activity for patient; 12/22/21: Pt demonstrates good maintenance with stairs. She ascends/descends the stairs at home as often as she can without using her chair stairlift; 04/01/22: Pt demonstrates good maintenance with stairs. She ascends/descends the stairs at home as often as she can without using her chair stairlift, pt also performs stairs in the community; 06/21/22: Pt and family reporting increased difficulty and fatigue when performing stairs at home. Pt reports that she is having more DOE; 07/27/22: Pt and family reporting more difficulty and fatigue when performing stairs at home as well as worsening DOE; 10/28/22: Pt reports she continues have difficulty with steps due to balance and strength deficits; 03/09/23: Pt continues with difficulty with steps; 05/17/23: Unchanged, continued difficulty although performs daily;   Time 12    Period Weeks    Status On-going    Target Date 08/25/23     PT LONG TERM GOAL #4   Title Patient will complete a TUG test in no more than 13.5 seconds for independent mobility and decreased fall risk maintenance    Baseline 12/11/20: 13.4s; 03/02/21: 13.5s; 06/11/21: 11.4s; 08/20/21: 12.35s; 11/12/21: 12.2s; 12/21/21: 12.0s; 04/01/22: 11.3s; 06/21/22: 13.4s; 07/27/22: 13.3s; 10/28/22: 13.4s; 03/08/23: 12.2s; 05/17/23: 12.3s   Time 12    Period Weeks    Status On-going    Target Date 08/25/23     PT LONG TERM GOAL #5   Title Patient will increase fastest 10 meter walk test to >0.81 m/s as to improve gait speed for better community ambulation and to reduce fall risk.    Baseline 12/11/20: 14.6s = 0.68 m/s; 03/02/21: 14.3s = 0.70 m/s; 06/11/21: 13.2s = 0.76 m/s; 08/20/21: 13.5s= 0.74 m/s; 11/12/21: 14.4s =  0.69 m/s; 12/21/21: 14.1s = 0.71 m/s; 04/01/22: 13.7s = 0.73 m/s; 06/21/22: 14.1s = 0.71 m/s; 07/27/22: 14.0s =  0.71 m/s; 10/28/22: 14.4s =  0.69 m/s; 03/08/23: 14.3s =  0.70 m/s; 05/17/23: 13.4s = 0.75 m/s   Time 12    Period Weeks    Status Partially Met    Target Date 08/25/23  Plan     Clinical Impression Statement Pt demonstrates excellent motivation today. Updated outcome measures and goals during last session on 05/17/23. Her TUG, BERG, and 5TSTS all remained consistent compared to the last update (12.3s, 33/56, and 7.7s respectively). Her 58m gait speed is slightly faster and she ambulated 19' farther during the . Patient's FOTO increased to 51. Overall pt showed improvement in some outcome measures and for other demonstrated excellent maintenance. She continues to report more difficulty with stairs but performs them at least once per day instead of using the stair lift. Session today focused on a combination of balance and strengthening exercises with greater focus on strengthening. Discharge attempted at the end of 2021 and pt demonstrated a drop in her function during her prolonged absence out of therapy despite good consistency with her HEP. Frequency was also decreased from every other week to once per month to find the minimum effective frequency, however, unfortunately this resulted in an unacceptable decline in patient's function as well. Plan is to continue frequency of one therapy session every other week throughout 2024 in order to maintain her function. Pt is regularly exercising at home with the assistance of family as needed/appropriate. She is unable to complete all necessary exercises safely at home either independently or with family assistance due to need from physical therapist to monitor and correct form and technique as well as to provide safe guarding. Provided continued reinforcement regarding the importance of consistency with HEP and therapy sessions will focus on  appropriate modifications to her HEP, family education, and maintenance of her function. Therapist will reassess patient's outcome measures at regular intervals to assess need for continued maintenance therapy frequency. Pt requires physical therapy intervention to maintain her functional status including LE strength, balance, and gait speed/quality and she has not achieved maximal benefit from therapy services. She will benefit from continued PT services to address these deficits and maintain her function at home and school.    Personal Factors and Comorbidities Comorbidity 1;Past/Current Experience;Time since onset of injury/illness/exacerbation;Transportation;Social Background    Comorbidities spina bifida    Examination-Activity Limitations Bend;Carry;Continence;Lift;Reach Overhead;Squat;Stairs;Stand;Transfers    Examination-Participation Restrictions Church;Community Activity;Driving;Laundry;Meal Prep;Volunteer;Yard Work    Stability/Clinical Decision Making Stable/Uncomplicated    Rehab Potential Good    Clinical Impairments Affecting Rehab Potential weakness and decreased standing balance    PT Frequency 1 visit every other week   PT Duration 12 weeks    PT Treatment/Interventions ADLs/Self Care Home Management;Aquatic Therapy;Biofeedback; Moist Heat;Electrical Stimulation;DME Instruction;Gait training;Stair training;Functional mobility training;Neuromuscular re-education;Balance training;Therapeutic exercise;Therapeutic activities;Patient/family education;Orthotic Fit/Training;;Manual techniques;Passive range of motion;Energy conservation;Splinting;Taping;Visual/perceptual remediation/compensation;Cryotherapy    PT Next Visit Plan review and modify HEP as appropriate, balance and strength exercises    PT Home Exercise Plan Seated, supine, and core program: [email or web address]    Consulted and Agree with Plan of Care Patient             Lynnea Maizes PT, DPT, [facility] Arissa Fagin, PT 06/04/2023,  11:25 AM

## 2023-06-15 NOTE — Therapy (Signed)
OUTPATIENT PHYSICAL THERAPY TREATMENT NOTE   Patient Name: Michelle Randall MRN: [id number] DOB:10/09/1996, 27 y.o., female Today's Date: 06/17/2023  PCP: Carren Rang, PA-C REFERRING PROVIDER: Carren Rang, PA-C   PT End of Session - 06/16/23 1752     Visit Number 63    Number of Visits 88    Date for PT Re-Evaluation 08/25/23    Authorization Type eval: 12/11/20, recert: 03/08/23, 06/02/23    Authorization Time Period [employer] auth 8/1-10/23 for 6 PT visits, Medicaid VL: 27 combined PT/OT/SLP per year, AUTH IS REQ    Authorization - Visit Number 1    Authorization - Number of Visits 6    PT Start Time 1445    PT Stop Time 1530    PT Time Calculation (min) 45 min    Equipment Utilized During Treatment Gait belt   Bilateral AFOs, lofstrand crutches   Activity Tolerance Patient tolerated treatment well    Behavior During Therapy WFL for tasks assessed/performed            Past Medical History:  Diagnosis Date   Neurogenic bladder    Neurogenic bowel    S/P VP shunt    Spina bifida (HCC)    Past Surgical History:  Procedure Laterality Date   VENTRICULOPERITONEAL SHUNT     Patient Active Problem List   Diagnosis Date Noted   Hydrocephalus with operating shunt (HCC) 03/17/2023   Malfunction of ventriculo-peritoneal shunt (HCC) 07/17/2018   Attention deficit 08/02/2017   Urinary incontinence 05/31/2016   Spina bifida (HCC) 07/29/2014   Neurogenic bladder 05/07/2013   Neurogenic bowel 04/29/2011   REFERRING DIAG: R26.81 (ICD-10-CM) - Unsteadiness on feet  THERAPY DIAG: Muscle weakness (generalized)  Unsteadiness on feet  PERTINENT HISTORY: Patient is a pleasant 27 year old female who presents for continuation of care for generalized weakness/ coordination deficits secondary to diagnosis of spina bifida. PMH includes VP shunt, spina bifida, and neurogenic bowel/bladder. Patient was discharged from this clinic at the end of 2021 due to meeting therapy cap for  insurance that year. Patient reports that she has continued performing her HEP but notes a decline when she stops therapy due to inability to safely perform all of the balance and strength exercises as at home. One of the biggest challenge she is experiencing is stair navigation She reports having multiple near falls when negotiating stairs at home. Patient must ascend/descend stairs to reach bedroom upstairs increasing her risk for falls daily.   PRECAUTIONS: Fall risk  OUTCOME MEASURES: TEST 12/11/20 03/02/21 06/11/21 08/20/21 11/12/21 12/21/21 04/01/22 06/21/22 07/27/22 10/28/22 03/08/23 05/17/23 Interpretation  5 times sit<>stand 10.6s 9.6s 8.1s Not Tested 7.4s 8.2s 8.3s 8.6s 7.7s 7.7s 7.6s 7.7s WNL  10 meter walk test 14.6s = 0.68 m/s 14.3s = 0.70 m/s 13.2s =  0.76 m/s 13.5s=0.74 m/s 14.4s = 0.69 m/s 14.1s = 0.71 m/s 13.7s = 0.73 m/s 14.1s = 0.71 m/s 14.0s =  0.71 m/s 14.4s =  0.69 m/s 14.3s =  0.70 m/s 13.4s =  0.75 m/s <1.0 m/s indicates increased risk for falls; limited community ambulator  Timed up and Go 13.4s 13.5s 11.4s 12.35s 12.2s 12.0s 11.3s 13.4s 13.3s 13.4s 12.2s 12.3s WNL, use of lofstrand crutches, >14 sec indicates increased risk for falls  2 minute walk test [employer] Def[employer] 264' with lofstrand crutches (post: HR: 150, 99%, BORG: 5/10) 233' with lofstrand crutches (post: HR: 145, 97%, BORG: 5/10) 258' with lofstrand crutches (post: BP: 123/74, HR: 130, 100%, BORG:  6/10) 230' with lofstrand crutches (post:, HR: 141, 100%, BORG: 6/10) 249' with lofstrand crutches (post: HR: 131 bpm, SpO2: 98%, BORG: 5/10) 1000 feet is community ambulator  BERG Balance Assessment 32/56 32/56 33/56    34/56 34/56 34/56  35/56 31/56 29/56  33/56 33/56  33/56 <36/56 (100% risk for falls), 37-45 (80% risk for falls); 46-51 (>50% risk for falls); 52-55 (lower risk <25% of falls)  FOTO 51 49 53 53 49 50 45 47 45 50  49 51 Goal is to reach 59     TODAY'S TREATMENT:       SUBJECTIVE: Pt reports that she is doing well today. No changes since the last therapy session. All DME is functioning properly. Work is going well. No reported falls and no specific questions or concerns at this time. She is diligent performing her HEP.    PAIN:  Are you having pain? No   Ther-ex  NuStep L1-4 BLE only x 10 minutes for warm-up during interval history intake (5 minutes unbilled); Walking lunges in // bars with BUE support x 4 lengths; Sit to stands from chair without UE support x 20;   Neuromuscular Re-education Forward gait in // bars with single LUE support and balancing ball on top of agility cone in opposite UE for dual motor task x 2 lengths; Sidestepping in // bars with single LUE support and balancing ball on top of agility cone in opposite UE for dual motor task x 1 length each direction; Forward gait stepping over 1"x4" boards in // bars with single UE support x multiple laps; Airex feet apart (FA) static balance eyes open x 30s; Airex FA eyes closed x 30s; Airex FA eyes open with horizontal and vertical head turns x 30s each; Airex FA dynamic reaching in multiple directions including crossing midline x multiple bouts with each UE;    Not performed: BOSU (round side up) step-ups alternating leading LE with BUE support x 10 on each side; Seated LAQ with manual resistance from therapist x 10 BLE; Airex feet apart dynamic cone passes with therapist adjusting height and distance from patient as well as challenging pt to cross midline x multiple bouts on each side; Airex staggered balance with front foot on 6" step x 60s on each side; Nautilus resisted gait with 20# forward, backward, R lateral, and L lateral x 3 each direction, pt using Lofstrand crutches; Seated adductor squeeze with manual resistance x 10; Seated alternating LAQ with manual resistance x 10 BLE; Total Gym (TG) Level 22 (L22) double leg squats x 10; TG L22 single leg squats 2 x 10 BLE; TG L22  double leg jumps 2 x 10; 6" forward step ups alternating leading LE with BUE support x 10 on each side;    PATIENT EDUCATION: Education details: Pt educated throughout session about proper posture and technique with exercises. Improved exercise technique, movement at target joints, use of target muscles after min to mod verbal, visual, tactile cues. Balance exercises Person educated: Patient Education method: Explanation and Demonstration Education comprehension: verbalized understanding and returned demonstration   HOME EXERCISE PROGRAM: Seated, supine, and core program: Medbridge Access Code: [email or web address]      PT Short Term Goals       PT SHORT TERM GOAL #1   Title Patient will report compliance with HEP for continued strengthening and stability during functional mobility.     Time 6    Period Weeks    Status Achieved  PT Long Term Goals      PT LONG TERM GOAL #1   Title Patient will increase her FOTO score to at least 59 to demonstrate increased function and mobility for higher quality of life.    Baseline 12/11/20: 51; 03/02/21: 49; 06/11/21: 53; 08/20/21: 53; 11/12/21: 49; 12/21/21: 50/ 04/01/22: 45; 06/21/22: 47; 07/27/22: 45; 10/28/22: 50; 03/08/23: 49; 05/17/23: 51   Time 12    Period Weeks    Status Partially Met    Target Date 08/25/23     PT LONG TERM GOAL #2   Title Patient will maintain Berg Balance score of at least 32/56 in order to demonstrate decreased fall risk during functional activities.    Baseline 12/11/20: 32/56; 03/02/21: 32/56; 06/11/21: 33/56; 08/20/2021: 02/72; 11/12/21: 34/56; 12/22/21: 34/56; 04/01/22: 35/56; 06/21/22: 31/56; 07/27/22: 29/56; 10/28/22: 33/56; 03/08/23: 33/56; 05/17/23: 33/56;   Time 12    Period Weeks    Status On-going    Target Date 08/25/23     PT LONG TERM GOAL #3   Title Patient will be able to maintain safe performance with ascending/descending a flight of stairs using lofstrand crutches and railing in order to allow for safe  navigation of her home environment.    Baseline 12/11/20: currently able to use lofstrands and/or railing with supervision by family; 03/02/21: Good maintenance observed; 06/11/21: Good maintenance observed; 08/20/2021: Good maintenance observed; 11/13/21: Good maintenance observed, family reports it continues to be the most challenging activity for patient; 12/22/21: Pt demonstrates good maintenance with stairs. She ascends/descends the stairs at home as often as she can without using her chair stairlift; 04/01/22: Pt demonstrates good maintenance with stairs. She ascends/descends the stairs at home as often as she can without using her chair stairlift, pt also performs stairs in the community; 06/21/22: Pt and family reporting increased difficulty and fatigue when performing stairs at home. Pt reports that she is having more DOE; 07/27/22: Pt and family reporting more difficulty and fatigue when performing stairs at home as well as worsening DOE; 10/28/22: Pt reports she continues have difficulty with steps due to balance and strength deficits; 03/09/23: Pt continues with difficulty with steps; 05/17/23: Unchanged, continued difficulty although performs daily;   Time 12    Period Weeks    Status On-going    Target Date 08/25/23     PT LONG TERM GOAL #4   Title Patient will complete a TUG test in no more than 13.5 seconds for independent mobility and decreased fall risk maintenance    Baseline 12/11/20: 13.4s; 03/02/21: 13.5s; 06/11/21: 11.4s; 08/20/21: 12.35s; 11/12/21: 12.2s; 12/21/21: 12.0s; 04/01/22: 11.3s; 06/21/22: 13.4s; 07/27/22: 13.3s; 10/28/22: 13.4s; 03/08/23: 12.2s; 05/17/23: 12.3s   Time 12    Period Weeks    Status On-going    Target Date 08/25/23     PT LONG TERM GOAL #5   Title Patient will increase fastest 10 meter walk test to >0.81 m/s as to improve gait speed for better community ambulation and to reduce fall risk.    Baseline 12/11/20: 14.6s = 0.68 m/s; 03/02/21: 14.3s = 0.70 m/s; 06/11/21: 13.2s = 0.76  m/s; 08/20/21: 13.5s= 0.74 m/s; 11/12/21: 14.4s = 0.69 m/s; 12/21/21: 14.1s = 0.71 m/s; 04/01/22: 13.7s = 0.73 m/s; 06/21/22: 14.1s = 0.71 m/s; 07/27/22: 14.0s =  0.71 m/s; 10/28/22: 14.4s =  0.69 m/s; 03/08/23: 14.3s =  0.70 m/s; 05/17/23: 13.4s = 0.75 m/s   Time 12    Period Weeks    Status Partially Met    Target  Date 08/25/23             Plan     Clinical Impression Statement Pt demonstrates excellent motivation today. Session today focused on a combination of balance and strengthening exercises with greater focus on balance today. Repeated dual motor tasks and also utilized unstable Airex pad to simulate uneven ground. Discharge attempted at the end of 2021 and pt demonstrated a drop in her function during her prolonged absence out of therapy despite good consistency with her HEP. Frequency was also decreased from every other week to once per month to find the minimum effective frequency, however, unfortunately this resulted in an unacceptable decline in patient's function as well. Plan is to continue frequency of one therapy session every other week throughout 2024 in order to maintain her function. Pt is regularly exercising at home with the assistance of family as needed/appropriate. She is unable to complete all necessary exercises safely at home either independently or with family assistance due to need from physical therapist to monitor and correct form and technique as well as to provide safe guarding. Provided continued reinforcement regarding the importance of consistency with HEP and therapy sessions will focus on appropriate modifications to her HEP, family education, and maintenance of her function. Therapist will reassess patient's outcome measures at regular intervals to assess need for continued maintenance therapy frequency. Pt requires physical therapy intervention to maintain her functional status including LE strength, balance, and gait speed/quality and she has not achieved maximal  benefit from therapy services. She will benefit from continued PT services to address these deficits and maintain her function at home and school.    Personal Factors and Comorbidities Comorbidity 1;Past/Current Experience;Time since onset of injury/illness/exacerbation;Transportation;Social Background    Comorbidities spina bifida    Examination-Activity Limitations Bend;Carry;Continence;Lift;Reach Overhead;Squat;Stairs;Stand;Transfers    Examination-Participation Restrictions Church;Community Activity;Driving;Laundry;Meal Prep;Volunteer;Yard Work    Stability/Clinical Decision Making Stable/Uncomplicated    Rehab Potential Good    Clinical Impairments Affecting Rehab Potential weakness and decreased standing balance    PT Frequency 1 visit every other week   PT Duration 12 weeks    PT Treatment/Interventions ADLs/Self Care Home Management;Aquatic Therapy;Biofeedback; Moist Heat;Electrical Stimulation;DME Instruction;Gait training;Stair training;Functional mobility training;Neuromuscular re-education;Balance training;Therapeutic exercise;Therapeutic activities;Patient/family education;Orthotic Fit/Training;;Manual techniques;Passive range of motion;Energy conservation;Splinting;Taping;Visual/perceptual remediation/compensation;Cryotherapy    PT Next Visit Plan review and modify HEP as appropriate, balance and strength exercises    PT Home Exercise Plan Seated, supine, and core program: [email or web address]    Consulted and Agree with Plan of Care Patient             Lynnea Maizes PT, DPT, [facility] Klaudia Beirne, PT 06/17/2023, 11:41 AM

## 2023-06-16 ENCOUNTER — Ambulatory Visit: Payer: Medicaid Other | Attending: Student

## 2023-06-16 DIAGNOSIS — M6281 Muscle weakness (generalized): Secondary | ICD-10-CM | POA: Diagnosis present

## 2023-06-16 DIAGNOSIS — R531 Weakness: Secondary | ICD-10-CM | POA: Diagnosis present

## 2023-06-16 DIAGNOSIS — R2681 Unsteadiness on feet: Secondary | ICD-10-CM | POA: Insufficient documentation

## 2023-06-29 ENCOUNTER — Ambulatory Visit: Payer: Medicaid Other

## 2023-06-29 DIAGNOSIS — M6281 Muscle weakness (generalized): Secondary | ICD-10-CM | POA: Diagnosis not present

## 2023-06-29 DIAGNOSIS — R2681 Unsteadiness on feet: Secondary | ICD-10-CM

## 2023-06-29 NOTE — Therapy (Unsigned)
OUTPATIENT PHYSICAL THERAPY TREATMENT NOTE   Patient Name: Michelle Randall MRN: [id number] DOB:04/29/1996, 27 y.o., female Today's Date: 06/30/2023  PCP: Carren Rang, PA-C REFERRING PROVIDER: Carren Rang, PA-C   PT End of Session - 06/29/23 0808     Visit Number 64    Number of Visits 88    Date for PT Re-Evaluation 08/25/23    Authorization Type eval: 12/11/20, recert: 03/08/23, 06/02/23    Authorization Time Period [employer] auth 8/1-10/23 for 6 PT visits, Medicaid VL: 27 combined PT/OT/SLP per year, AUTH IS REQ    Authorization - Visit Number 2    Authorization - Number of Visits 6    PT Start Time 0800    PT Stop Time 0845    PT Time Calculation (min) 45 min    Equipment Utilized During Treatment Gait belt   Bilateral AFOs, lofstrand crutches   Activity Tolerance Patient tolerated treatment well    Behavior During Therapy WFL for tasks assessed/performed            Past Medical History:  Diagnosis Date   Neurogenic bladder    Neurogenic bowel    S/P VP shunt    Spina bifida (HCC)    Past Surgical History:  Procedure Laterality Date   VENTRICULOPERITONEAL SHUNT     Patient Active Problem List   Diagnosis Date Noted   Hydrocephalus with operating shunt (HCC) 03/17/2023   Malfunction of ventriculo-peritoneal shunt (HCC) 07/17/2018   Attention deficit 08/02/2017   Urinary incontinence 05/31/2016   Spina bifida (HCC) 07/29/2014   Neurogenic bladder 05/07/2013   Neurogenic bowel 04/29/2011   REFERRING DIAG: R26.81 (ICD-10-CM) - Unsteadiness on feet  THERAPY DIAG: Muscle weakness (generalized)  Unsteadiness on feet  PERTINENT HISTORY: Patient is a pleasant 27 year old female who presents for continuation of care for generalized weakness/ coordination deficits secondary to diagnosis of spina bifida. PMH includes VP shunt, spina bifida, and neurogenic bowel/bladder. Patient was discharged from this clinic at the end of 2021 due to meeting therapy cap for  insurance that year. Patient reports that she has continued performing her HEP but notes a decline when she stops therapy due to inability to safely perform all of the balance and strength exercises as at home. One of the biggest challenge she is experiencing is stair navigation She reports having multiple near falls when negotiating stairs at home. Patient must ascend/descend stairs to reach bedroom upstairs increasing her risk for falls daily.   PRECAUTIONS: Fall risk  OUTCOME MEASURES: TEST 12/11/20 03/02/21 06/11/21 08/20/21 11/12/21 12/21/21 04/01/22 06/21/22 07/27/22 10/28/22 03/08/23 05/17/23 Interpretation  5 times sit<>stand 10.6s 9.6s 8.1s Not Tested 7.4s 8.2s 8.3s 8.6s 7.7s 7.7s 7.6s 7.7s WNL  10 meter walk test 14.6s = 0.68 m/s 14.3s = 0.70 m/s 13.2s =  0.76 m/s 13.5s=0.74 m/s 14.4s = 0.69 m/s 14.1s = 0.71 m/s 13.7s = 0.73 m/s 14.1s = 0.71 m/s 14.0s =  0.71 m/s 14.4s =  0.69 m/s 14.3s =  0.70 m/s 13.4s =  0.75 m/s <1.0 m/s indicates increased risk for falls; limited community ambulator  Timed up and Go 13.4s 13.5s 11.4s 12.35s 12.2s 12.0s 11.3s 13.4s 13.3s 13.4s 12.2s 12.3s WNL, use of lofstrand crutches, >14 sec indicates increased risk for falls  2 minute walk test [employer] Def[employer] 264' with lofstrand crutches (post: HR: 150, 99%, BORG: 5/10) 233' with lofstrand crutches (post: HR: 145, 97%, BORG: 5/10) 258' with lofstrand crutches (post: BP: 123/74, HR: 130, 100%, BORG:  6/10) 230' with lofstrand crutches (post:, HR: 141, 100%, BORG: 6/10) 249' with lofstrand crutches (post: HR: 131 bpm, SpO2: 98%, BORG: 5/10) 1000 feet is community ambulator  BERG Balance Assessment 32/56 32/56 33/56    34/56 34/56 34/56  35/56 31/56 29/56  33/56 33/56  33/56 <36/56 (100% risk for falls), 37-45 (80% risk for falls); 46-51 (>50% risk for falls); 52-55 (lower risk <25% of falls)  FOTO 51 49 53 53 49 50 45 47 45 50  49 51 Goal is to reach 59     TODAY'S TREATMENT:       SUBJECTIVE: Pt reports that she is doing well today. No changes since the last therapy session. All DME is functioning properly. Work is going well. She had a fall in the rain last week when her crutch slipped on the water but she did not get injured. She is diligent performing her HEP. No specific questions upon arrival.   PAIN:  Are you having pain? No   Ther-ex  NuStep L1-4 BLE only x 10 minutes for warm-up during interval history intake (5 minutes unbilled); Resisted stepping in // bars with single black tband forward and R lateral x 4 each direction;   Neuromuscular Re-education Airex feet apart (FA) static balance eyes open x 30s; Airex FA eyes closed x 30s; Airex FA cone stacking to challenge dynamic reaching including forward reach and crossing midline; Airex balance beam side stepping x multiple laps; Airex balance beam side stepping with dynamic reaching for dual motor tasking; Rockerboard static balance and lateral weight shifting in R/L orientation;   Not performed: BOSU (round side up) step-ups alternating leading LE with BUE support x 10 on each side; Seated LAQ with manual resistance from therapist x 10 BLE; Airex staggered balance with front foot on 6" step x 60s on each side; Nautilus resisted gait with 20# forward, backward, R lateral, and L lateral x 3 each direction, pt using Lofstrand crutches; Seated adductor squeeze with manual resistance x 10; Seated alternating LAQ with manual resistance x 10 BLE; Total Gym (TG) Level 22 (L22) double leg squats x 10; TG L22 single leg squats 2 x 10 BLE; TG L22 double leg jumps 2 x 10; 6" forward step ups alternating leading LE with BUE support x 10 on each side; Walking lunges in // bars with BUE support x 4 lengths; Sit to stands from chair without UE support x 20; Forward gait in // bars with single LUE support and balancing ball on top of agility cone in opposite UE for dual motor task x 2 lengths; Sidestepping in //  bars with single LUE support and balancing ball on top of agility cone in opposite UE for dual motor task x 1 length each direction; Forward gait stepping over 1"x4" boards in // bars with single UE support x multiple laps; Airex FA eyes open with horizontal and vertical head turns x 30s each; Airex FA dynamic reaching in multiple directions including crossing midline x multiple bouts with each UE;     PATIENT EDUCATION: Education details: Pt educated throughout session about proper posture and technique with exercises. Improved exercise technique, movement at target joints, use of target muscles after min to mod verbal, visual, tactile cues. Balance exercises Person educated: Patient Education method: Explanation and Demonstration Education comprehension: verbalized understanding and returned demonstration   HOME EXERCISE PROGRAM: Seated, supine, and core program: Medbridge Access Code: [email or web address]      PT Short Term Goals       PT SHORT TERM  GOAL #1   Title Patient will report compliance with HEP for continued strengthening and stability during functional mobility.     Time 6    Period Weeks    Status Achieved              PT Long Term Goals      PT LONG TERM GOAL #1   Title Patient will increase her FOTO score to at least 59 to demonstrate increased function and mobility for higher quality of life.    Baseline 12/11/20: 51; 03/02/21: 49; 06/11/21: 53; 08/20/21: 53; 11/12/21: 49; 12/21/21: 50/ 04/01/22: 45; 06/21/22: 47; 07/27/22: 45; 10/28/22: 50; 03/08/23: 49; 05/17/23: 51   Time 12    Period Weeks    Status Partially Met    Target Date 08/25/23     PT LONG TERM GOAL #2   Title Patient will maintain Berg Balance score of at least 32/56 in order to demonstrate decreased fall risk during functional activities.    Baseline 12/11/20: 32/56; 03/02/21: 32/56; 06/11/21: 33/56; 08/20/2021: 86/57; 11/12/21: 34/56; 12/22/21: 34/56; 04/01/22: 35/56; 06/21/22: 31/56; 07/27/22: 29/56; 10/28/22: 33/56;  03/08/23: 33/56; 05/17/23: 33/56;   Time 12    Period Weeks    Status On-going    Target Date 08/25/23     PT LONG TERM GOAL #3   Title Patient will be able to maintain safe performance with ascending/descending a flight of stairs using lofstrand crutches and railing in order to allow for safe navigation of her home environment.    Baseline 12/11/20: currently able to use lofstrands and/or railing with supervision by family; 03/02/21: Good maintenance observed; 06/11/21: Good maintenance observed; 08/20/2021: Good maintenance observed; 11/13/21: Good maintenance observed, family reports it continues to be the most challenging activity for patient; 12/22/21: Pt demonstrates good maintenance with stairs. She ascends/descends the stairs at home as often as she can without using her chair stairlift; 04/01/22: Pt demonstrates good maintenance with stairs. She ascends/descends the stairs at home as often as she can without using her chair stairlift, pt also performs stairs in the community; 06/21/22: Pt and family reporting increased difficulty and fatigue when performing stairs at home. Pt reports that she is having more DOE; 07/27/22: Pt and family reporting more difficulty and fatigue when performing stairs at home as well as worsening DOE; 10/28/22: Pt reports she continues have difficulty with steps due to balance and strength deficits; 03/09/23: Pt continues with difficulty with steps; 05/17/23: Unchanged, continued difficulty although performs daily;   Time 12    Period Weeks    Status On-going    Target Date 08/25/23     PT LONG TERM GOAL #4   Title Patient will complete a TUG test in no more than 13.5 seconds for independent mobility and decreased fall risk maintenance    Baseline 12/11/20: 13.4s; 03/02/21: 13.5s; 06/11/21: 11.4s; 08/20/21: 12.35s; 11/12/21: 12.2s; 12/21/21: 12.0s; 04/01/22: 11.3s; 06/21/22: 13.4s; 07/27/22: 13.3s; 10/28/22: 13.4s; 03/08/23: 12.2s; 05/17/23: 12.3s   Time 12    Period Weeks    Status  On-going    Target Date 08/25/23     PT LONG TERM GOAL #5   Title Patient will increase fastest 10 meter walk test to >0.81 m/s as to improve gait speed for better community ambulation and to reduce fall risk.    Baseline 12/11/20: 14.6s = 0.68 m/s; 03/02/21: 14.3s = 0.70 m/s; 06/11/21: 13.2s = 0.76 m/s; 08/20/21: 13.5s= 0.74 m/s; 11/12/21: 14.4s = 0.69 m/s; 12/21/21: 14.1s = 0.71 m/s; 04/01/22: 13.7s = 0.73  m/s; 06/21/22: 14.1s = 0.71 m/s; 07/27/22: 14.0s =  0.71 m/s; 10/28/22: 14.4s =  0.69 m/s; 03/08/23: 14.3s =  0.70 m/s; 05/17/23: 13.4s = 0.75 m/s   Time 12    Period Weeks    Status Partially Met    Target Date 08/25/23             Plan     Clinical Impression Statement Pt demonstrates excellent motivation today. Session today focused on a combination of balance and strengthening exercises with greater focus on balance today. Repeated dual motor tasks and also utilized unstable Airex pad to simulate uneven ground. Discharge attempted at the end of 2021 and pt demonstrated a drop in her function during her prolonged absence out of therapy despite good consistency with her HEP. Frequency was also decreased from every other week to once per month to find the minimum effective frequency, however, unfortunately this resulted in an unacceptable decline in patient's function as well. Plan is to continue frequency of one therapy session every other week throughout 2024 in order to maintain her function. Pt is regularly exercising at home with the assistance of family as needed/appropriate. She is unable to complete all necessary exercises safely at home either independently or with family assistance due to need from physical therapist to monitor and correct form and technique as well as to provide safe guarding. Provided continued reinforcement regarding the importance of consistency with HEP and therapy sessions will focus on appropriate modifications to her HEP, family education, and maintenance of her  function. Therapist will reassess patient's outcome measures at regular intervals to assess need for continued maintenance therapy frequency. Pt requires physical therapy intervention to maintain her functional status including LE strength, balance, and gait speed/quality and she has not achieved maximal benefit from therapy services. She will benefit from continued PT services to address these deficits and maintain her function at home and school.    Personal Factors and Comorbidities Comorbidity 1;Past/Current Experience;Time since onset of injury/illness/exacerbation;Transportation;Social Background    Comorbidities spina bifida    Examination-Activity Limitations Bend;Carry;Continence;Lift;Reach Overhead;Squat;Stairs;Stand;Transfers    Examination-Participation Restrictions Church;Community Activity;Driving;Laundry;Meal Prep;Volunteer;Yard Work    Stability/Clinical Decision Making Stable/Uncomplicated    Rehab Potential Good    Clinical Impairments Affecting Rehab Potential weakness and decreased standing balance    PT Frequency 1 visit every other week   PT Duration 12 weeks    PT Treatment/Interventions ADLs/Self Care Home Management;Aquatic Therapy;Biofeedback; Moist Heat;Electrical Stimulation;DME Instruction;Gait training;Stair training;Functional mobility training;Neuromuscular re-education;Balance training;Therapeutic exercise;Therapeutic activities;Patient/family education;Orthotic Fit/Training;;Manual techniques;Passive range of motion;Energy conservation;Splinting;Taping;Visual/perceptual remediation/compensation;Cryotherapy    PT Next Visit Plan review and modify HEP as appropriate, balance and strength exercises    PT Home Exercise Plan Seated, supine, and core program: [email or web address]    Consulted and Agree with Plan of Care Patient             Lynnea Maizes PT, DPT, [facility] Michelle Randall, PT 06/30/2023, 8:39 AM

## 2023-06-30 ENCOUNTER — Ambulatory Visit: Payer: Medicaid Other

## 2023-07-14 ENCOUNTER — Ambulatory Visit: Payer: Medicaid Other

## 2023-07-14 DIAGNOSIS — R2681 Unsteadiness on feet: Secondary | ICD-10-CM

## 2023-07-14 DIAGNOSIS — R531 Weakness: Secondary | ICD-10-CM

## 2023-07-14 DIAGNOSIS — M6281 Muscle weakness (generalized): Secondary | ICD-10-CM

## 2023-07-14 NOTE — Therapy (Addendum)
OUTPATIENT PHYSICAL THERAPY TREATMENT NOTE   Patient Name: Michelle Randall MRN: [id number] DOB:May 07, 1996, 27 y.o., female Today's Date: 07/15/2023  PCP: Carren Rang, PA-C REFERRING PROVIDER: Carren Rang, PA-C   PT End of Session - 07/14/23 1448     Visit Number 65    Number of Visits 88    Date for PT Re-Evaluation 08/25/23    Authorization Type eval: 12/11/20, recert: 03/08/23, 06/02/23    Authorization Time Period [employer] auth 8/1-10/23 for 6 PT visits, Medicaid VL: 27 combined PT/OT/SLP per year, AUTH IS REQ    Authorization - Visit Number 3    Authorization - Number of Visits 6    PT Start Time 1440    PT Stop Time 1525    PT Time Calculation (min) 45 min    Equipment Utilized During Treatment Gait belt   Bilateral AFOs, lofstrand crutches   Activity Tolerance Patient tolerated treatment well    Behavior During Therapy WFL for tasks assessed/performed            Past Medical History:  Diagnosis Date   Neurogenic bladder    Neurogenic bowel    S/P VP shunt    Spina bifida (HCC)    Past Surgical History:  Procedure Laterality Date   VENTRICULOPERITONEAL SHUNT     Patient Active Problem List   Diagnosis Date Noted   Hydrocephalus with operating shunt (HCC) 03/17/2023   Malfunction of ventriculo-peritoneal shunt (HCC) 07/17/2018   Attention deficit 08/02/2017   Urinary incontinence 05/31/2016   Spina bifida (HCC) 07/29/2014   Neurogenic bladder 05/07/2013   Neurogenic bowel 04/29/2011   REFERRING DIAG: R26.81 (ICD-10-CM) - Unsteadiness on feet  THERAPY DIAG: Muscle weakness (generalized)  Unsteadiness on feet  Weakness generalized  PERTINENT HISTORY: Patient is a pleasant 27 year old female who presents for continuation of care for generalized weakness/ coordination deficits secondary to diagnosis of spina bifida. PMH includes VP shunt, spina bifida, and neurogenic bowel/bladder. Patient was discharged from this clinic at the end of 2021 due to  meeting therapy cap for insurance that year. Patient reports that she has continued performing her HEP but notes a decline when she stops therapy due to inability to safely perform all of the balance and strength exercises as at home. One of the biggest challenge she is experiencing is stair navigation She reports having multiple near falls when negotiating stairs at home. Patient must ascend/descend stairs to reach bedroom upstairs increasing her risk for falls daily.   PRECAUTIONS: Fall risk  OUTCOME MEASURES: TEST 12/11/20 03/02/21 06/11/21 08/20/21 11/12/21 12/21/21 04/01/22 06/21/22 07/27/22 10/28/22 03/08/23 05/17/23 Interpretation  5 times sit<>stand 10.6s 9.6s 8.1s Not Tested 7.4s 8.2s 8.3s 8.6s 7.7s 7.7s 7.6s 7.7s WNL  10 meter walk test 14.6s = 0.68 m/s 14.3s = 0.70 m/s 13.2s =  0.76 m/s 13.5s=0.74 m/s 14.4s = 0.69 m/s 14.1s = 0.71 m/s 13.7s = 0.73 m/s 14.1s = 0.71 m/s 14.0s =  0.71 m/s 14.4s =  0.69 m/s 14.3s =  0.70 m/s 13.4s =  0.75 m/s <1.0 m/s indicates increased risk for falls; limited community ambulator  Timed up and Jacques Willingham 13.4s 13.5s 11.4s 12.35s 12.2s 12.0s 11.3s 13.4s 13.3s 13.4s 12.2s 12.3s WNL, use of lofstrand crutches, >14 sec indicates increased risk for falls  2 minute walk test [employer] 264' with lofstrand crutches (post: HR: 150, 99%, BORG: 5/10) 233' with lofstrand crutches (post: HR: 145, 97%, BORG: 5/10) 258' with lofstrand crutches (post: BP: 123/74, HR:  130, 100%, BORG: 6/10) 230' with lofstrand crutches (post:, HR: 141, 100%, BORG: 6/10) 249' with lofstrand crutches (post: HR: 131 bpm, SpO2: 98%, BORG: 5/10) 1000 feet is community ambulator  BERG Balance Assessment 32/56 32/56 33/56    34/56 34/56 34/56  35/56 31/56 29/56  33/56 33/56  33/56 <36/56 (100% risk for falls), 37-45 (80% risk for falls); 46-51 (>50% risk for falls); 52-55 (lower risk <25% of falls)  FOTO 51 49 53 53 49 50 45 47 45 50  49 51 Goal is to reach 59      TODAY'S TREATMENT:      SUBJECTIVE: Pt reports that she is doing well today. No changes since the last therapy session. Work is going well and started a Nurse, adult at [employer], but now has to increase activity throughout the week. She is diligent performing her HEP. No specific questions upon arrival.   PAIN:  Are you having pain? No   Ther-ex  NuStep L1-4 BLE only x 10 minutes for warm-up during interval history intake; Resisted stepping in // bars with black theratube forward and backward direction x 3 ea direction (emphasis on controlled large steps); Total Gym (Level 22) BLE 2 x 10;  Total Gym Single (Level 22) LE Alternating LE 2 x 10; Total Gym BLE Jumps 2 x 10;  BOSU Forward Lunges 1 x 10 Alternating LE;    Neuromuscular Re-education Airex feet apart (FA) static balance eyes open x 30s added perturbations; Airex feet apart (FA) static balance with added perturbations eyes open x 30s; Airex FA eyes closed x 30s; Airex balance beam side stepping x multiple laps; Airex balance beam side stepping with dynamic reaching for dual motor tasking;   Not performed: BOSU (round side up) step-ups alternating leading LE with BUE support x 10 on each side; Seated LAQ with manual resistance from therapist x 10 BLE; Airex staggered balance with front foot on 6" step x 60s on each side; Nautilus resisted gait with 20# forward, backward, R lateral, and L lateral x 3 each direction, pt using Lofstrand crutches; Seated adductor squeeze with manual resistance x 10; Seated alternating LAQ with manual resistance x 10 BLE; Total Gym (TG) Level 22 (L22) double leg squats x 10; TG L22 single leg squats 2 x 10 BLE; TG L22 double leg jumps 2 x 10; 6" forward step ups alternating leading LE with BUE support x 10 on each side; Walking lunges in // bars with BUE support x 4 lengths; Sit to stands from chair without UE support x 20; Forward gait in // bars with single LUE support and  balancing ball on top of agility cone in opposite UE for dual motor task x 2 lengths; Sidestepping in // bars with single LUE support and balancing ball on top of agility cone in opposite UE for dual motor task x 1 length each direction; Forward gait stepping over 1"x4" boards in // bars with single UE support x multiple laps; Airex FA eyes open with horizontal and vertical head turns x 30s each; Airex FA dynamic reaching in multiple directions including crossing midline x multiple bouts with each UE;    PATIENT EDUCATION: Education details: Pt educated throughout session about proper posture and technique with exercises. Improved exercise technique, movement at target joints, use of target muscles after min to mod verbal, visual, tactile cues. Balance exercises Person educated: Patient Education method: Explanation and Demonstration Education comprehension: verbalized understanding and returned demonstration   HOME EXERCISE PROGRAM: Seated, supine, and core program: Medbridge  Access Code: [email or web address]      PT Short Term Goals       PT SHORT TERM GOAL #1   Title Patient will report compliance with HEP for continued strengthening and stability during functional mobility.     Time 6    Period Weeks    Status Achieved              PT Long Term Goals      PT LONG TERM GOAL #1   Title Patient will increase her FOTO score to at least 59 to demonstrate increased function and mobility for higher quality of life.    Baseline 12/11/20: 51; 03/02/21: 49; 06/11/21: 53; 08/20/21: 53; 11/12/21: 49; 12/21/21: 50/ 04/01/22: 45; 06/21/22: 47; 07/27/22: 45; 10/28/22: 50; 03/08/23: 49; 05/17/23: 51   Time 12    Period Weeks    Status Partially Met    Target Date 08/25/23     PT LONG TERM GOAL #2   Title Patient will maintain Berg Balance score of at least 32/56 in order to demonstrate decreased fall risk during functional activities.    Baseline 12/11/20: 32/56; 03/02/21: 32/56; 06/11/21: 33/56; 08/20/2021:  47/42; 11/12/21: 34/56; 12/22/21: 34/56; 04/01/22: 35/56; 06/21/22: 31/56; 07/27/22: 29/56; 10/28/22: 33/56; 03/08/23: 33/56; 05/17/23: 33/56;   Time 12    Period Weeks    Status On-going    Target Date 08/25/23     PT LONG TERM GOAL #3   Title Patient will be able to maintain safe performance with ascending/descending a flight of stairs using lofstrand crutches and railing in order to allow for safe navigation of her home environment.    Baseline 12/11/20: currently able to use lofstrands and/or railing with supervision by family; 03/02/21: Good maintenance observed; 06/11/21: Good maintenance observed; 08/20/2021: Good maintenance observed; 11/13/21: Good maintenance observed, family reports it continues to be the most challenging activity for patient; 12/22/21: Pt demonstrates good maintenance with stairs. She ascends/descends the stairs at home as often as she can without using her chair stairlift; 04/01/22: Pt demonstrates good maintenance with stairs. She ascends/descends the stairs at home as often as she can without using her chair stairlift, pt also performs stairs in the community; 06/21/22: Pt and family reporting increased difficulty and fatigue when performing stairs at home. Pt reports that she is having more DOE; 07/27/22: Pt and family reporting more difficulty and fatigue when performing stairs at home as well as worsening DOE; 10/28/22: Pt reports she continues have difficulty with steps due to balance and strength deficits; 03/09/23: Pt continues with difficulty with steps; 05/17/23: Unchanged, continued difficulty although performs daily;   Time 12    Period Weeks    Status On-going    Target Date 08/25/23     PT LONG TERM GOAL #4   Title Patient will complete a TUG test in no more than 13.5 seconds for independent mobility and decreased fall risk maintenance    Baseline 12/11/20: 13.4s; 03/02/21: 13.5s; 06/11/21: 11.4s; 08/20/21: 12.35s; 11/12/21: 12.2s; 12/21/21: 12.0s; 04/01/22: 11.3s; 06/21/22: 13.4s;  07/27/22: 13.3s; 10/28/22: 13.4s; 03/08/23: 12.2s; 05/17/23: 12.3s   Time 12    Period Weeks    Status On-going    Target Date 08/25/23     PT LONG TERM GOAL #5   Title Patient will increase fastest 10 meter walk test to >0.81 m/s as to improve gait speed for better community ambulation and to reduce fall risk.    Baseline 12/11/20: 14.6s = 0.68 m/s; 03/02/21: 14.3s = 0.70 m/s; 06/11/21: 13.2s =  0.76 m/s; 08/20/21: 13.5s= 0.74 m/s; 11/12/21: 14.4s = 0.69 m/s; 12/21/21: 14.1s = 0.71 m/s; 04/01/22: 13.7s = 0.73 m/s; 06/21/22: 14.1s = 0.71 m/s; 07/27/22: 14.0s =  0.71 m/s; 10/28/22: 14.4s =  0.69 m/s; 03/08/23: 14.3s =  0.70 m/s; 05/17/23: 13.4s = 0.75 m/s   Time 12    Period Weeks    Status Partially Met    Target Date 08/25/23             Plan     Clinical Impression Statement Pt demonstrates excellent motivation today. Session today with a main focus on LE strengthening and stability training. Patient performed forward and side stepping on airex pad/beam to simulate uneven terrain on [place] [facility]. Patient endorsed increase in activity such as walking due to research project; PT educated on endurance training in order to improve muscular endurance. Pt demonstrated improved LE strength as she completed bilateral and single leg squats on total gym without report of additional fatigue or pain. Pt is regularly exercising at home with the assistance of family as needed/appropriate. She is unable to complete all necessary exercises safely at home either independently or with family assistance due to need from physical therapist to monitor and correct form and technique as well as to provide safe guarding. Therapist will reassess patient's outcome measures at regular intervals to assess need for continued maintenance therapy frequency. Pt requires physical therapy intervention to maintain her functional status including LE strength, balance, and gait speed/quality and she has not achieved maximal benefit from therapy  services. She will benefit from continued PT services to address these deficits and maintain her function at home and school.    Personal Factors and Comorbidities Comorbidity 1;Past/Current Experience;Time since onset of injury/illness/exacerbation;Transportation;Social Background    Comorbidities spina bifida    Examination-Activity Limitations Bend;Carry;Continence;Lift;Reach Overhead;Squat;Stairs;Stand;Transfers    Examination-Participation Restrictions Church;Community Activity;Driving;Laundry;Meal Prep;Volunteer;Yard Work    Stability/Clinical Decision Making Stable/Uncomplicated    Rehab Potential Good    Clinical Impairments Affecting Rehab Potential weakness and decreased standing balance    PT Frequency 1 visit every other week   PT Duration 12 weeks    PT Treatment/Interventions ADLs/Self Care Home Management;Aquatic Therapy;Biofeedback; Moist Heat;Electrical Stimulation;DME Instruction;Gait training;Stair training;Functional mobility training;Neuromuscular re-education;Balance training;Therapeutic exercise;Therapeutic activities;Patient/family education;Orthotic Fit/Training;;Manual techniques;Passive range of motion;Energy conservation;Splinting;Taping;Visual/perceptual remediation/compensation;Cryotherapy    PT Next Visit Plan review and modify HEP as appropriate, balance and strength exercises    PT Home Exercise Plan Seated, supine, and core program: [email or web address]    Consulted and Agree with Plan of Care Patient             Lynnea Maizes PT, DPT, [facility] Huprich,Jason, PT 07/15/2023, 12:19 PM

## 2023-07-28 ENCOUNTER — Ambulatory Visit: Payer: Medicaid Other | Attending: Student

## 2023-07-28 DIAGNOSIS — R2681 Unsteadiness on feet: Secondary | ICD-10-CM | POA: Diagnosis present

## 2023-07-28 DIAGNOSIS — R531 Weakness: Secondary | ICD-10-CM | POA: Insufficient documentation

## 2023-07-28 DIAGNOSIS — M6281 Muscle weakness (generalized): Secondary | ICD-10-CM | POA: Insufficient documentation

## 2023-07-28 NOTE — Therapy (Addendum)
OUTPATIENT PHYSICAL THERAPY TREATMENT NOTE   Patient Name: Michelle Randall MRN: [id number] DOB:1996-01-02, 27 y.o., female Today's Date: 07/30/2023  PCP: Michelle Rang, PA-C REFERRING PROVIDER: Carren Rang, PA-C   PT End of Session - 07/30/23 1315     Visit Number 66    Number of Visits 88    Date for PT Re-Evaluation 08/25/23    Authorization Type eval: 12/11/20, recert: 03/08/23, 06/02/23    Authorization Time Period [employer] auth 8/1-10/23 for 6 PT visits, Medicaid VL: 27 combined PT/OT/SLP per year, AUTH IS REQ    Authorization - Visit Number 4    Authorization - Number of Visits 6    PT Start Time 1450    PT Stop Time 1528    PT Time Calculation (min) 38 min    Equipment Utilized During Treatment Gait belt   Bilateral AFOs, lofstrand crutches   Activity Tolerance Patient tolerated treatment well    Behavior During Therapy WFL for tasks assessed/performed            Past Medical History:  Diagnosis Date   Neurogenic bladder    Neurogenic bowel    S/P VP shunt    Spina bifida (HCC)    Past Surgical History:  Procedure Laterality Date   VENTRICULOPERITONEAL SHUNT     Patient Active Problem List   Diagnosis Date Noted   Hydrocephalus with operating shunt (HCC) 03/17/2023   Malfunction of ventriculo-peritoneal shunt (HCC) 07/17/2018   Attention deficit 08/02/2017   Urinary incontinence 05/31/2016   Spina bifida (HCC) 07/29/2014   Neurogenic bladder 05/07/2013   Neurogenic bowel 04/29/2011   REFERRING DIAG: R26.81 (ICD-10-CM) - Unsteadiness on feet  THERAPY DIAG: Muscle weakness (generalized)  Unsteadiness on feet  PERTINENT HISTORY: Patient is a pleasant 27 year old female who presents for continuation of care for generalized weakness/ coordination deficits secondary to diagnosis of spina bifida. PMH includes VP shunt, spina bifida, and neurogenic bowel/bladder. Patient was discharged from this clinic at the end of 2021 due to meeting therapy cap for  insurance that year. Patient reports that she has continued performing her HEP but notes a decline when she stops therapy due to inability to safely perform all of the balance and strength exercises as at home. One of the biggest challenge she is experiencing is stair navigation She reports having multiple near falls when negotiating stairs at home. Patient must ascend/descend stairs to reach bedroom upstairs increasing her risk for falls daily.   PRECAUTIONS: Fall risk  OUTCOME MEASURES: TEST 12/11/20 03/02/21 06/11/21 08/20/21 11/12/21 12/21/21 04/01/22 06/21/22 07/27/22 10/28/22 03/08/23 05/17/23 Interpretation  5 times sit<>stand 10.6s 9.6s 8.1s Not Tested 7.4s 8.2s 8.3s 8.6s 7.7s 7.7s 7.6s 7.7s WNL  10 meter walk test 14.6s = 0.68 m/s 14.3s = 0.70 m/s 13.2s =  0.76 m/s 13.5s=0.74 m/s 14.4s = 0.69 m/s 14.1s = 0.71 m/s 13.7s = 0.73 m/s 14.1s = 0.71 m/s 14.0s =  0.71 m/s 14.4s =  0.69 m/s 14.3s =  0.70 m/s 13.4s =  0.75 m/s <1.0 m/s indicates increased risk for falls; limited community ambulator  Timed up and [place] 13.4s 13.5s 11.4s 12.35s 12.2s 12.0s 11.3s 13.4s 13.3s 13.4s 12.2s 12.3s WNL, use of lofstrand crutches, >14 sec indicates increased risk for falls  2 minute walk test [employer] Def[employer] 264' with lofstrand crutches (post: HR: 150, 99%, BORG: 5/10) 233' with lofstrand crutches (post: HR: 145, 97%, BORG: 5/10) 258' with lofstrand crutches (post: BP: 123/74, HR: 130, 100%, BORG:  6/10) 230' with lofstrand crutches (post:, HR: 141, 100%, BORG: 6/10) 249' with lofstrand crutches (post: HR: 131 bpm, SpO2: 98%, BORG: 5/10) 1000 feet is community ambulator  BERG Balance Assessment 32/56 32/56 33/56    34/56 34/56 34/56  35/56 31/56 29/56  33/56 33/56  33/56 <36/56 (100% risk for falls), 37-45 (80% risk for falls); 46-51 (>50% risk for falls); 52-55 (lower risk <25% of falls)  FOTO 51 49 53 53 49 50 45 47 45 50  49 51 Goal is to reach 59     TODAY'S TREATMENT:       SUBJECTIVE: Pt reports that she is doing well today. No changes since the last session; pt did report that she was sore for 24hr after the last PT session. She continues to be diligent performing her HEP. No specific questions upon arrival.   PAIN:  Are you having pain? No   Ther-ex  NuStep L1-4 BLE only x 6 minutes for cool-down  Stairway Navigation with single UE x multiple bouts;  Total Gym (Level 22) BLE Squat  2 x 10;  Forward Lunges, Alternating LE 2 x 10;  Crossover Stepping Alternate LE in front of the other 2 x 10;     Neuromuscular Re-education Airex Step Up with Single and BUE support 1 x 20 taps;  Airex balance beam side stepping x multiple laps; Sit to Stands with Airex Pad 2 x 10;    Not performed: BOSU (round side up) step-ups alternating leading LE with BUE support x 10 on each side; Seated LAQ with manual resistance from therapist x 10 BLE; Airex staggered balance with front foot on 6" step x 60s on each side; Nautilus resisted gait with 20# forward, backward, R lateral, and L lateral x 3 each direction, pt using Lofstrand crutches; Seated adductor squeeze with manual resistance x 10; Seated alternating LAQ with manual resistance x 10 BLE; Total Gym (TG) Level 22 (L22) double leg squats x 10; TG L22 single leg squats 2 x 10 BLE; TG L22 double leg jumps 2 x 10; 6" forward step ups alternating leading LE with BUE support x 10 on each side; Walking lunges in // bars with BUE support x 4 lengths; Sit to stands from chair without UE support x 20; Forward gait in // bars with single LUE support and balancing ball on top of agility cone in opposite UE for dual motor task x 2 lengths; Sidestepping in // bars with single LUE support and balancing ball on top of agility cone in opposite UE for dual motor task x 1 length each direction; Forward gait stepping over 1"x4" boards in // bars with single UE support x multiple laps; Airex FA eyes open with horizontal and  vertical head turns x 30s each; Airex FA dynamic reaching in multiple directions including crossing midline x multiple bouts with each UE;    PATIENT EDUCATION: Education details: Pt educated throughout session about proper posture and technique with exercises. Improved exercise technique, movement at target joints, use of target muscles after min to mod verbal, visual, tactile cues. Balance exercises Person educated: Patient Education method: Explanation and Demonstration Education comprehension: verbalized understanding and returned demonstration   HOME EXERCISE PROGRAM: Seated, supine, and core program: Medbridge Access Code: [email or web address]      PT Short Term Goals       PT SHORT TERM GOAL #1   Title Patient will report compliance with HEP for continued strengthening and stability during functional mobility.     Time 6  Period Weeks    Status Achieved              PT Long Term Goals      PT LONG TERM GOAL #1   Title Patient will increase her FOTO score to at least 59 to demonstrate increased function and mobility for higher quality of life.    Baseline 12/11/20: 51; 03/02/21: 49; 06/11/21: 53; 08/20/21: 53; 11/12/21: 49; 12/21/21: 50/ 04/01/22: 45; 06/21/22: 47; 07/27/22: 45; 10/28/22: 50; 03/08/23: 49; 05/17/23: 51   Time 12    Period Weeks    Status Partially Met    Target Date 08/25/23     PT LONG TERM GOAL #2   Title Patient will maintain Berg Balance score of at least 32/56 in order to demonstrate decreased fall risk during functional activities.    Baseline 12/11/20: 32/56; 03/02/21: 32/56; 06/11/21: 33/56; 08/20/2021: 78/29; 11/12/21: 34/56; 12/22/21: 34/56; 04/01/22: 35/56; 06/21/22: 31/56; 07/27/22: 29/56; 10/28/22: 33/56; 03/08/23: 33/56; 05/17/23: 33/56;   Time 12    Period Weeks    Status On-going    Target Date 08/25/23     PT LONG TERM GOAL #3   Title Patient will be able to maintain safe performance with ascending/descending a flight of stairs using lofstrand crutches and  railing in order to allow for safe navigation of her home environment.    Baseline 12/11/20: currently able to use lofstrands and/or railing with supervision by family; 03/02/21: Good maintenance observed; 06/11/21: Good maintenance observed; 08/20/2021: Good maintenance observed; 11/13/21: Good maintenance observed, family reports it continues to be the most challenging activity for patient; 12/22/21: Pt demonstrates good maintenance with stairs. She ascends/descends the stairs at home as often as she can without using her chair stairlift; 04/01/22: Pt demonstrates good maintenance with stairs. She ascends/descends the stairs at home as often as she can without using her chair stairlift, pt also performs stairs in the community; 06/21/22: Pt and family reporting increased difficulty and fatigue when performing stairs at home. Pt reports that she is having more DOE; 07/27/22: Pt and family reporting more difficulty and fatigue when performing stairs at home as well as worsening DOE; 10/28/22: Pt reports she continues have difficulty with steps due to balance and strength deficits; 03/09/23: Pt continues with difficulty with steps; 05/17/23: Unchanged, continued difficulty although performs daily;   Time 12    Period Weeks    Status On-going    Target Date 08/25/23     PT LONG TERM GOAL #4   Title Patient will complete a TUG test in no more than 13.5 seconds for independent mobility and decreased fall risk maintenance    Baseline 12/11/20: 13.4s; 03/02/21: 13.5s; 06/11/21: 11.4s; 08/20/21: 12.35s; 11/12/21: 12.2s; 12/21/21: 12.0s; 04/01/22: 11.3s; 06/21/22: 13.4s; 07/27/22: 13.3s; 10/28/22: 13.4s; 03/08/23: 12.2s; 05/17/23: 12.3s   Time 12    Period Weeks    Status On-going    Target Date 08/25/23     PT LONG TERM GOAL #5   Title Patient will increase fastest 10 meter walk test to >0.81 m/s as to improve gait speed for better community ambulation and to reduce fall risk.    Baseline 12/11/20: 14.6s = 0.68 m/s; 03/02/21: 14.3s =  0.70 m/s; 06/11/21: 13.2s = 0.76 m/s; 08/20/21: 13.5s= 0.74 m/s; 11/12/21: 14.4s = 0.69 m/s; 12/21/21: 14.1s = 0.71 m/s; 04/01/22: 13.7s = 0.73 m/s; 06/21/22: 14.1s = 0.71 m/s; 07/27/22: 14.0s =  0.71 m/s; 10/28/22: 14.4s =  0.69 m/s; 03/08/23: 14.3s =  0.70 m/s; 05/17/23: 13.4s = 0.75  m/s   Time 12    Period Weeks    Status Partially Met    Target Date 08/25/23             Plan     Clinical Impression Statement Pt demonstrates excellent motivation today. Today's session with a main focus on progressing LE strength and balance. Patient presents with minor fatigue today due to increased walking and other demands at [facility]. She continues to be challenged with lateral exercises with abduction. Activities requiring left abduction are difficult and required UE support. Patient continues to demonstrate improvements in LE strength. Pt is regularly exercising at home with the assistance of family as needed/appropriate. She is unable to complete all necessary exercises safely at home either independently or with family assistance due to need from physical therapist to monitor and correct form and technique as well as to provide safe guarding. Therapist will reassess patient's outcome measures at regular intervals to assess need for continued maintenance therapy frequency. Pt requires physical therapy intervention to maintain her functional status including LE strength, balance, and gait speed/quality and she has not achieved maximal benefit from therapy services. She will benefit from continued PT services to address these deficits and maintain her function at home and school.    Personal Factors and Comorbidities Comorbidity 1;Past/Current Experience;Time since onset of injury/illness/exacerbation;Transportation;Social Background    Comorbidities spina bifida    Examination-Activity Limitations Bend;Carry;Continence;Lift;Reach Overhead;Squat;Stairs;Stand;Transfers    Examination-Participation Restrictions  Church;Community Activity;Driving;Laundry;Meal Prep;Volunteer;Yard Work    Stability/Clinical Decision Making Stable/Uncomplicated    Rehab Potential Good    Clinical Impairments Affecting Rehab Potential weakness and decreased standing balance    PT Frequency 1 visit every other week   PT Duration 12 weeks    PT Treatment/Interventions ADLs/Self Care Home Management;Aquatic Therapy;Biofeedback; Moist Heat;Electrical Stimulation;DME Instruction;Gait training;Stair training;Functional mobility training;Neuromuscular re-education;Balance training;Therapeutic exercise;Therapeutic activities;Patient/family education;Orthotic Fit/Training;;Manual techniques;Passive range of motion;Energy conservation;Splinting;Taping;Visual/perceptual remediation/compensation;Cryotherapy    PT Next Visit Plan review and modify HEP as appropriate, balance and strength exercises    PT Home Exercise Plan Seated, supine, and core program: [email or web address]    Consulted and Agree with Plan of Care Patient            Thayer Ohm Ellayna Hilligoss SPT Sharalyn Ink Huprich PT, DPT, GCS Huprich,Jason, PT 07/30/2023, 1:16 PM

## 2023-08-11 ENCOUNTER — Ambulatory Visit: Payer: Medicaid Other

## 2023-08-11 DIAGNOSIS — R2681 Unsteadiness on feet: Secondary | ICD-10-CM

## 2023-08-11 DIAGNOSIS — M6281 Muscle weakness (generalized): Secondary | ICD-10-CM

## 2023-08-11 DIAGNOSIS — R531 Weakness: Secondary | ICD-10-CM

## 2023-08-25 ENCOUNTER — Ambulatory Visit: Payer: Medicaid Other | Attending: Student

## 2023-08-25 DIAGNOSIS — M6281 Muscle weakness (generalized): Secondary | ICD-10-CM | POA: Insufficient documentation

## 2023-08-25 DIAGNOSIS — R2681 Unsteadiness on feet: Secondary | ICD-10-CM | POA: Insufficient documentation

## 2023-08-25 NOTE — Therapy (Addendum)
OUTPATIENT PHYSICAL THERAPY TREATMENT/RECERTIFICATION   Patient Name: Michelle Randall MRN: [id number] DOB:1996-08-16, 27 y.o., female Today's Date: 08/29/2023  PCP: Carren Rang, PA-C REFERRING PROVIDER: Carren Rang, PA-C   PT End of Session - 08/29/23 0849     Visit Number 68    Number of Visits 100    Date for PT Re-Evaluation 11/17/23    Authorization Type eval: 12/11/20, recert: 03/08/23, 06/02/23    Authorization Time Period [employer] auth 8/1-10/23 for 6 PT visits, Medicaid VL: 27 combined PT/OT/SLP per year, AUTH IS REQ    Authorization - Visit Number 6    Authorization - Number of Visits 6    PT Start Time 1445    PT Stop Time 1530    PT Time Calculation (min) 45 min    Equipment Utilized During Treatment Gait belt   Bilateral AFOs, lofstrand crutches   Activity Tolerance Patient tolerated treatment well    Behavior During Therapy WFL for tasks assessed/performed            Past Medical History:  Diagnosis Date   Neurogenic bladder    Neurogenic bowel    S/P VP shunt    Spina bifida (HCC)    Past Surgical History:  Procedure Laterality Date   VENTRICULOPERITONEAL SHUNT     Patient Active Problem List   Diagnosis Date Noted   Hydrocephalus with operating shunt (HCC) 03/17/2023   Malfunction of ventriculo-peritoneal shunt (HCC) 07/17/2018   Attention deficit 08/02/2017   Urinary incontinence 05/31/2016   Spina bifida (HCC) 07/29/2014   Neurogenic bladder 05/07/2013   Neurogenic bowel 04/29/2011   REFERRING DIAG: R26.81 (ICD-10-CM) - Unsteadiness on feet  THERAPY DIAG: Muscle weakness (generalized)  Unsteadiness on feet  PERTINENT HISTORY: Patient is a pleasant 27 year old female who presents for continuation of care for generalized weakness/ coordination deficits secondary to diagnosis of spina bifida. PMH includes VP shunt, spina bifida, and neurogenic bowel/bladder. Patient was discharged from this clinic at the end of 2021 due to meeting therapy  cap for insurance that year. Patient reports that she has continued performing her HEP but notes a decline when she stops therapy due to inability to safely perform all of the balance and strength exercises as at home. One of the biggest challenge she is experiencing is stair navigation She reports having multiple near falls when negotiating stairs at home. Patient must ascend/descend stairs to reach bedroom upstairs increasing her risk for falls daily.   PRECAUTIONS: Fall risk  OUTCOME MEASURES: TEST 12/11/20 03/02/21 06/11/21 08/20/21 11/12/21 12/21/21 04/01/22 06/21/22 07/27/22 10/28/22 03/08/23 05/17/23 Interpretation  5 times sit<>stand 10.6s 9.6s 8.1s Not Tested 7.4s 8.2s 8.3s 8.6s 7.7s 7.7s 7.6s 7.7s WNL  10 meter walk test 14.6s = 0.68 m/s 14.3s = 0.70 m/s 13.2s =  0.76 m/s 13.5s=0.74 m/s 14.4s = 0.69 m/s 14.1s = 0.71 m/s 13.7s = 0.73 m/s 14.1s = 0.71 m/s 14.0s =  0.71 m/s 14.4s =  0.69 m/s 14.3s =  0.70 m/s 13.4s =  0.75 m/s <1.0 m/s indicates increased risk for falls; limited community ambulator  Timed up and Jamell Laymon 13.4s 13.5s 11.4s 12.35s 12.2s 12.0s 11.3s 13.4s 13.3s 13.4s 12.2s 12.3s WNL, use of lofstrand crutches, >14 sec indicates increased risk for falls  2 minute walk test [employer] 264' with lofstrand crutches (post: HR: 150, 99%, BORG: 5/10) 233' with lofstrand crutches (post: HR: 145, 97%, BORG: 5/10) 258' with lofstrand crutches (post: BP: 123/74, HR: 130, 100%, BORG: 6/10)  230' with lofstrand crutches (post:, HR: 141, 100%, BORG: 6/10) 249' with lofstrand crutches (post: HR: 131 bpm, SpO2: 98%, BORG: 5/10) 1000 feet is community ambulator  BERG Balance Assessment 32/56 32/56 33/56    34/56 34/56 34/56  35/56 31/56 29/56  33/56 33/56  33/56 <36/56 (100% risk for falls), 37-45 (80% risk for falls); 46-51 (>50% risk for falls); 52-55 (lower risk <25% of falls)  FOTO 51 49 53 53 49 50 45 47 45 50  49 51 Goal is to reach 59     TODAY'S TREATMENT:      SUBJECTIVE: Pt states that she is doing well today. Patient is reporting moderate to severe fatigue when walking across campus with her backpack (~7lbs) using her Lofstrand crutches and would like to improve her endurance. No specific questions upon arrival.   PAIN: Denies Resting Pain    Neuromuscular Re-education   OUTCOME MEASURES:  TEST 12/11/20 03/02/21 06/11/21 08/20/21 11/12/21 12/21/21 04/01/22 06/21/22 07/27/22 10/28/22 03/08/23 05/17/23 08/25/2023 Interpretation  5 times sit<>stand 10.6s 9.6s 8.1s Not Tested 7.4s 8.2s 8.3s 8.6s 7.7s 7.7s 7.6s 7.7s 8.2s WNL  10 meter walk test 14.6s = 0.68 m/s 14.3s = 0.70 m/s 13.2s =  0.76 m/s 13.5s=0.74 m/s 14.4s = 0.69 m/s 14.1s = 0.71 m/s 13.7s = 0.73 m/s 14.1s = 0.71 m/s 14.0s =  0.71 m/s 14.4s =  0.69 m/s 14.3s =  0.70 m/s 13.4s =  0.75 m/s 14.0s = 0.71 m/s <1.0 m/s indicates increased risk for falls; limited community ambulator  Timed up and [name] 13.4s 13.5s 11.4s 12.35s 12.2s 12.0s 11.3s 13.4s 13.3s 13.4s 12.2s 12.3s 12.0 WNL, use of lofstrand crutches, >14 sec indicates increased risk for falls  2 minute walk test [employer] 264' with lofstrand crutches (post: HR: 150, 99%, BORG: 5/10) 233' with lofstrand crutches (post: HR: 145, 97%, BORG: 5/10) 258' with lofstrand crutches (post: BP: 123/74, HR: 130, 100%, BORG: 6/10) 230' with lofstrand crutches (post:, HR: 141, 100%, BORG: 6/10) 249' with lofstrand crutches (post: HR: 131 bpm, SpO2: 98%, BORG: 5/10) 252' with lofstrand crutches (post: HR: 134 bpm, SpO2: 98%, BORG: 8/10) 1000 feet is community ambulator  BERG Balance Assessment 32/56 32/56 33/56    34/56 34/56 34/56  35/56 31/56 29/56  33/56 33/56  33/56 32/56 <36/56 (100% risk for falls), 37-45 (80% risk for falls); 46-51 (>50% risk for falls); 52-55 (lower risk <25% of falls)  FOTO 51 49 53 53 49 50 45 47 45 50  49 51 53 Goal is to reach 59     Ther-ex  Forward Lunges in // bars, 48'/bout x 1  bouts; Total Gym Level 22, BLE Squat 2 x 12;   Not performed: Airex Pad Feet // bars, eyes closed;  Airex Pad Romberg Stance, Eyes Closed;  Airex Step Up with Single and BUE support 1 x 20 taps;  Side Stepping airex balance beam // bars with single UE support and cone stacking;  Sit to stand on airex  2 x 10;  Forward Lunge on Wobble pad (pink) in // bars  BUE support, 1 x 8 BLE BOSU (round side up) step-ups alternating leading LE with BUE support x 10 on each side; Seated LAQ with manual resistance from therapist x 10 BLE; Airex staggered balance with front foot on 6" step x 60s on each side; Nautilus resisted gait with 20# forward, backward, R lateral, and L lateral x 3 each direction, pt using Lofstrand crutches; Seated adductor squeeze with manual resistance x 10; Seated alternating LAQ  with manual resistance x 10 BLE; TG L22 single leg squats 2 x 10 BLE; TG L22 double leg jumps 2 x 10; 6" forward step ups alternating leading LE with BUE support x 10 on each side; Sit to stands from chair without UE support x 20; Forward gait in // bars with single LUE support and balancing ball on top of agility cone in opposite UE for dual motor task x 2 lengths; Forward gait stepping over 1"x4" boards in // bars with single UE support x multiple laps; Airex FA eyes open with horizontal and vertical head turns x 30s each; Airex FA dynamic reaching in multiple directions including crossing midline x multiple bouts with each UE;    PATIENT EDUCATION: Education details: Pt educated throughout session about proper posture and technique with exercises. Improved exercise technique, movement at target joints, use of target muscles after min to mod verbal, visual, tactile cues. Balance exercises and plan of care Person educated: Patient Education method: Explanation and Demonstration Education comprehension: verbalized understanding and returned demonstration   HOME EXERCISE PROGRAM: Seated, supine,  and core program: Medbridge Access Code: [email or web address]      PT Short Term Goals       PT SHORT TERM GOAL #1   Title Patient will report compliance with HEP for continued strengthening and stability during functional mobility.     Time 6    Period Weeks    Status Achieved              PT Long Term Goals      PT LONG TERM GOAL #1   Title Patient will increase her FOTO score to at least 59 to demonstrate increased function and mobility for higher quality of life.    Baseline 12/11/20: 51; 03/02/21: 49; 06/11/21: 53; 08/20/21: 53; 11/12/21: 49; 12/21/21: 50/ 04/01/22: 45; 06/21/22: 47; 07/27/22: 45; 10/28/22: 50; 03/08/23: 49; 05/17/23: 51 08/25/2023: 53   Time 12    Period Weeks    Status Partially Met    Target Date 11/17/2023      PT LONG TERM GOAL #2   Title Patient will maintain Berg Balance score of at least 32/56 in order to demonstrate decreased fall risk during functional activities.    Baseline 12/11/20: 32/56; 03/02/21: 32/56; 06/11/21: 82/95; 08/20/2021: 62/13; 11/12/21: 34/56; 12/22/21: 34/56; 04/01/22: 35/56; 06/21/22: 31/56; 07/27/22: 29/56; 10/28/22: 33/56; 03/08/23: 33/56; 05/17/23: 33/56; 08/25/2023: 32/56   Time 12    Period Weeks    Status On-going    Target Date 11/17/2023      PT LONG TERM GOAL #3   Title Patient will be able to maintain safe performance with ascending/descending a flight of stairs using lofstrand crutches and railing in order to allow for safe navigation of her home environment.    Baseline 12/11/20: currently able to use lofstrands and/or railing with supervision by family; 03/02/21: Good maintenance observed; 06/11/21: Good maintenance observed; 08/20/2021: Good maintenance observed; 11/13/21: Good maintenance observed, family reports it continues to be the most challenging activity for patient; 12/22/21: Pt demonstrates good maintenance with stairs. She ascends/descends the stairs at home as often as she can without using her chair stairlift; 04/01/22: Pt demonstrates good  maintenance with stairs. She ascends/descends the stairs at home as often as she can without using her chair stairlift, pt also performs stairs in the community; 06/21/22: Pt and family reporting increased difficulty and fatigue when performing stairs at home. Pt reports that she is having more DOE; 07/27/22: Pt and family reporting more  difficulty and fatigue when performing stairs at home as well as worsening DOE; 10/28/22: Pt reports she continues have difficulty with steps due to balance and strength deficits; 03/09/23: Pt continues with difficulty with steps; 05/17/23: Unchanged, continued difficulty although performs daily; 08/25/2023: Pt endorses continued difficulty with use BUE support but performs often.    Time 12    Period Weeks    Status On-going    Target Date 11/17/2023      PT LONG TERM GOAL #4   Title Patient will complete a TUG test in no more than 13.5 seconds for independent mobility and decreased fall risk maintenance    Baseline 12/11/20: 13.4s; 03/02/21: 13.5s; 06/11/21: 11.4s; 08/20/21: 12.35s; 11/12/21: 12.2s; 12/21/21: 12.0s; 04/01/22: 11.3s; 06/21/22: 13.4s; 07/27/22: 13.3s; 10/28/22: 13.4s; 03/08/23: 12.2s; 05/17/23: 12.3s, 08/25/23: 12s   Time 12    Period Weeks    Status On-going    Target Date 11/17/2023      PT LONG TERM GOAL #5   Title Patient will increase fastest 10 meter walk test to >0.81 m/s as to improve gait speed for better community ambulation and to reduce fall risk.    Baseline 12/11/20: 14.6s = 0.68 m/s; 03/02/21: 14.3s = 0.70 m/s; 06/11/21: 13.2s = 0.76 m/s; 08/20/21: 13.5s= 0.74 m/s; 11/12/21: 14.4s = 0.69 m/s; 12/21/21: 14.1s = 0.71 m/s; 04/01/22: 13.7s = 0.73 m/s; 06/21/22: 14.1s = 0.71 m/s; 07/27/22: 14.0s =  0.71 m/s; 10/28/22: 14.4s =  0.69 m/s; 03/08/23: 14.3s =  0.70 m/s; 05/17/23: 13.4s = 0.75 m/s, 08/25/2023: 0.71 m/s   Time 12    Period Weeks    Status Partially Met    Target Date 11/17/2023              Plan     Clinical Impression Statement Pt demonstrates excellent  motivation today. Updated outcome measures and goals during session. Her TUG, BERG, and 5TSTS all remained consistent today compared to the last update (12.0s, 32/56, and 8.2s respectively). Her 47m gait speed was relatively consistent with prior readings and once again she ambulated farther during the . Patient's FOTO increased to 53. Overall pt showed improvement in some outcome measures and for other demonstrated excellent maintenance which is her primary goal for therapy. She is reporting more fatigue and DOE with ambulation and this is confirmed based on her subjective readings at the end of her . Plan to work on additional endurance exercises during session and pt confirms she is using her recumbent bike at home. Discharge attempted at the end of 2021 and pt demonstrated a drop in her function during her prolonged absence out of therapy despite good consistency with her HEP. Frequency was also decreased from every other week to once per month to find the minimum effective frequency, however, unfortunately this resulted in an unacceptable decline in patient's function as well. Plan is to continue frequency of one therapy session every other week throughout 2024 in order to maintain her function. Pt is regularly exercising at home with the assistance of family as needed/appropriate. She is unable to complete all necessary exercises safely at home either independently or with family assistance due to need from physical therapist to monitor and correct form and technique as well as to provide safe guarding. Provided continued reinforcement regarding the importance of consistency with HEP and therapy sessions will focus on appropriate modifications to her HEP, family education, and maintenance of her function. Therapist will reassess patient's outcome measures at regular intervals to assess need for continued maintenance  therapy frequency. Pt requires physical therapy intervention to maintain her functional  status including LE strength, balance, and gait speed/quality and she has not achieved maximal benefit from therapy services. She will benefit from continued PT services to address these deficits and maintain her function at home and work.    Personal Factors and Comorbidities Comorbidity 1;Past/Current Experience;Time since onset of injury/illness/exacerbation;Transportation;Social Background    Comorbidities spina bifida    Examination-Activity Limitations Bend;Carry;Continence;Lift;Reach Overhead;Squat;Stairs;Stand;Transfers    Examination-Participation Restrictions Church;Community Activity;Driving;Laundry;Meal Prep;Volunteer;Yard Work    Stability/Clinical Decision Making Stable/Uncomplicated    Rehab Potential Good    Clinical Impairments Affecting Rehab Potential weakness and decreased standing balance    PT Frequency 1 visit every other week   PT Duration 12 weeks    PT Treatment/Interventions ADLs/Self Care Home Management;Aquatic Therapy;Biofeedback; Moist Heat;Electrical Stimulation;DME Instruction;Gait training;Stair training;Functional mobility training;Neuromuscular re-education;Balance training;Therapeutic exercise;Therapeutic activities;Patient/family education;Orthotic Fit/Training;;Manual techniques;Passive range of motion;Energy conservation;Splinting;Taping;Visual/perceptual remediation/compensation;Cryotherapy    PT Next Visit Plan review and modify HEP as appropriate, balance and strength exercises    PT Home Exercise Plan Seated, supine, and core program: [email or web address]    Consulted and Agree with Plan of Care Patient            Doran Stabler, SPT Sharalyn Ink Huprich PT, DPT, GCS Huprich,Jason, PT 08/29/2023, 9:04 AM

## 2023-09-08 ENCOUNTER — Ambulatory Visit: Payer: Medicaid Other

## 2023-09-22 ENCOUNTER — Ambulatory Visit: Payer: Medicaid Other

## 2023-09-29 ENCOUNTER — Ambulatory Visit: Payer: Medicaid Other | Attending: Student

## 2023-09-29 DIAGNOSIS — M6281 Muscle weakness (generalized): Secondary | ICD-10-CM | POA: Diagnosis present

## 2023-09-29 DIAGNOSIS — R2681 Unsteadiness on feet: Secondary | ICD-10-CM | POA: Insufficient documentation

## 2023-09-29 DIAGNOSIS — R531 Weakness: Secondary | ICD-10-CM | POA: Diagnosis present

## 2023-09-29 NOTE — Therapy (Signed)
OUTPATIENT PHYSICAL THERAPY TREATMENT   Patient Name: Michelle Randall MRN: [id number] DOB:1996-04-25, 27 y.o., female Today's Date: 09/30/2023  PCP: Carren Rang, PA-C REFERRING PROVIDER: Carren Rang, PA-C   PT End of Session - 09/29/23 1423     Visit Number 69    Number of Visits 100    Date for PT Re-Evaluation 11/17/23    Authorization Type eval: 12/11/20, recert: 03/08/23, 06/02/23    Authorization Time Period [facility] [facility] Harvey 10/28-12/29 for 5 PT visits  Medicaid 2024  RJ:JOACZ on auth  AUTH IS REQ  referring:Danielle Montez Morita    Authorization - Visit Number 1    Authorization - Number of Visits 5    PT Start Time 1400    PT Stop Time 1445    PT Time Calculation (min) 45 min    Equipment Utilized During Treatment Gait belt   Bilateral AFOs, lofstrand crutches   Activity Tolerance Patient tolerated treatment well    Behavior During Therapy WFL for tasks assessed/performed            Past Medical History:  Diagnosis Date   Neurogenic bladder    Neurogenic bowel    S/P VP shunt    Spina bifida (HCC)    Past Surgical History:  Procedure Laterality Date   VENTRICULOPERITONEAL SHUNT     Patient Active Problem List   Diagnosis Date Noted   Hydrocephalus with operating shunt (HCC) 03/17/2023   Malfunction of ventriculo-peritoneal shunt (HCC) 07/17/2018   Attention deficit 08/02/2017   Urinary incontinence 05/31/2016   Spina bifida (HCC) 07/29/2014   Neurogenic bladder 05/07/2013   Neurogenic bowel 04/29/2011   REFERRING DIAG: R26.81 (ICD-10-CM) - Unsteadiness on feet  THERAPY DIAG: Muscle weakness (generalized)  Unsteadiness on feet  Weakness generalized  PERTINENT HISTORY: Patient is a pleasant 27 year old female who presents for continuation of care for generalized weakness/ coordination deficits secondary to diagnosis of spina bifida. PMH includes VP shunt, spina bifida, and neurogenic bowel/bladder. Patient was discharged from this clinic at the end of  2021 due to meeting therapy cap for insurance that year. Patient reports that she has continued performing her HEP but notes a decline when she stops therapy due to inability to safely perform all of the balance and strength exercises as at home. One of the biggest challenge she is experiencing is stair navigation She reports having multiple near falls when negotiating stairs at home. Patient must ascend/descend stairs to reach bedroom upstairs increasing her risk for falls daily.   PRECAUTIONS: Fall risk  OUTCOME MEASURES: TEST 12/11/20 03/02/21 06/11/21 08/20/21 11/12/21 12/21/21 04/01/22 06/21/22 07/27/22 10/28/22 03/08/23 05/17/23 08/25/2023 Interpretation  5 times sit<>stand 10.6s 9.6s 8.1s Not Tested 7.4s 8.2s 8.3s 8.6s 7.7s 7.7s 7.6s 7.7s 8.2s WNL  10 meter walk test 14.6s = 0.68 m/s 14.3s = 0.70 m/s 13.2s =  0.76 m/s 13.5s=0.74 m/s 14.4s = 0.69 m/s 14.1s = 0.71 m/s 13.7s = 0.73 m/s 14.1s = 0.71 m/s 14.0s =  0.71 m/s 14.4s =  0.69 m/s 14.3s =  0.70 m/s 13.4s =  0.75 m/s 14.0s = 0.71 m/s <1.0 m/s indicates increased risk for falls; limited community ambulator  Timed up and Go 13.4s 13.5s 11.4s 12.35s 12.2s 12.0s 11.3s 13.4s 13.3s 13.4s 12.2s 12.3s 12.0 WNL, use of lofstrand crutches, >14 sec indicates increased risk for falls  2 minute walk test [employer] 264' with lofstrand crutches (post: HR: 150, 99%, BORG: 5/10) 233' with lofstrand crutches (post: HR: 145, 97%,  BORG: 5/10) 258' with lofstrand crutches (post: BP: 123/74, HR: 130, 100%, BORG: 6/10) 230' with lofstrand crutches (post:, HR: 141, 100%, BORG: 6/10) 249' with lofstrand crutches (post: HR: 131 bpm, SpO2: 98%, BORG: 5/10) 252' with lofstrand crutches (post: HR: 134 bpm, SpO2: 98%, BORG: 8/10) 1000 feet is community ambulator  BERG Balance Assessment 32/56 32/56 33/56    34/56 34/56 34/56  35/56 31/56 29/56  33/56 33/56  33/56 32/56 <36/56 (100% risk for falls), 37-45 (80% risk for falls); 46-51 (>50%  risk for falls); 52-55 (lower risk <25% of falls)  FOTO 51 49 53 53 49 50 45 47 45 50  49 51 53 Goal is to reach 59      TODAY'S TREATMENT:     SUBJECTIVE: Pt states that she is doing well today. She continues reporting fatigue when walking across campus with her backpack. Denies recent falls. No specific questions upon arrival.   PAIN: Denies Resting Pain    Ther-ex  NuStep L1-3 x 5 minutes for warm-up at start of session during interval history; Forward walking lunges in // bars x 4 lengths; Side stepping in // bars with single UE support x 4 lengths; Sit to stand without UE support x 10;   Neuromuscular Re-education  Blaze pod dynamic reaching using random setting and pods on mirror in circle pattern. Utilized 6 pods and 3 cycles of 60s each; Airex pad romberg stance, eyes closed x multiple bouts;  Airex pad romberg stance eyes open with horizontal and vertical head turns x multiple bouts; Airex pad romberg stance ball passes around body with therapist providing return pass to opposite side varying distance and height x multiple bouts on each side;   Not performed: Airex Step Up with Single and BUE support 1 x 20 taps;  Sit to stand on airex  2 x 10;  Forward Lunge on Wobble pad (pink) in // bars  BUE support, 1 x 8 BLE BOSU (round side up) step-ups alternating leading LE with BUE support x 10 on each side; Seated LAQ with manual resistance from therapist x 10 BLE; Airex staggered balance with front foot on 6" step x 60s on each side; Nautilus resisted gait with 20# forward, backward, R lateral, and L lateral x 3 each direction, pt using Lofstrand crutches; Seated adductor squeeze with manual resistance x 10; Seated alternating LAQ with manual resistance x 10 BLE; TG L22 single leg squats 2 x 10 BLE; TG L22 double leg jumps 2 x 10; 6" forward step ups alternating leading LE with BUE support x 10 on each side; Forward gait in // bars with single LUE support and balancing ball  on top of agility cone in opposite UE for dual motor task x 2 lengths; Forward gait stepping over 1"x4" boards in // bars with single UE support x multiple laps;    PATIENT EDUCATION: Education details: Pt educated throughout session about proper posture and technique with exercises. Improved exercise technique, movement at target joints, use of target muscles after min to mod verbal, visual, tactile cues. Balance exercises and plan of care Person educated: Patient Education method: Explanation and Demonstration Education comprehension: verbalized understanding and returned demonstration   HOME EXERCISE PROGRAM: Seated, supine, and core program: Medbridge Access Code: [email or web address]      PT Short Term Goals       PT SHORT TERM GOAL #1   Title Patient will report compliance with HEP for continued strengthening and stability during functional mobility.     Time 6  Period Weeks    Status Achieved              PT Long Term Goals      PT LONG TERM GOAL #1   Title Patient will increase her FOTO score to at least 59 to demonstrate increased function and mobility for higher quality of life.    Baseline 12/11/20: 51; 03/02/21: 49; 06/11/21: 53; 08/20/21: 53; 11/12/21: 49; 12/21/21: 50/ 04/01/22: 45; 06/21/22: 47; 07/27/22: 45; 10/28/22: 50; 03/08/23: 49; 05/17/23: 51 08/25/2023: 53   Time 12    Period Weeks    Status Partially Met    Target Date 11/17/2023      PT LONG TERM GOAL #2   Title Patient will maintain Berg Balance score of at least 32/56 in order to demonstrate decreased fall risk during functional activities.    Baseline 12/11/20: 32/56; 03/02/21: 32/56; 06/11/21: 16/10; 08/20/2021: 96/04; 11/12/21: 34/56; 12/22/21: 34/56; 04/01/22: 35/56; 06/21/22: 31/56; 07/27/22: 29/56; 10/28/22: 33/56; 03/08/23: 33/56; 05/17/23: 33/56; 08/25/2023: 32/56   Time 12    Period Weeks    Status On-going    Target Date 11/17/2023      PT LONG TERM GOAL #3   Title Patient will be able to maintain safe performance  with ascending/descending a flight of stairs using lofstrand crutches and railing in order to allow for safe navigation of her home environment.    Baseline 12/11/20: currently able to use lofstrands and/or railing with supervision by family; 03/02/21: Good maintenance observed; 06/11/21: Good maintenance observed; 08/20/2021: Good maintenance observed; 11/13/21: Good maintenance observed, family reports it continues to be the most challenging activity for patient; 12/22/21: Pt demonstrates good maintenance with stairs. She ascends/descends the stairs at home as often as she can without using her chair stairlift; 04/01/22: Pt demonstrates good maintenance with stairs. She ascends/descends the stairs at home as often as she can without using her chair stairlift, pt also performs stairs in the community; 06/21/22: Pt and family reporting increased difficulty and fatigue when performing stairs at home. Pt reports that she is having more DOE; 07/27/22: Pt and family reporting more difficulty and fatigue when performing stairs at home as well as worsening DOE; 10/28/22: Pt reports she continues have difficulty with steps due to balance and strength deficits; 03/09/23: Pt continues with difficulty with steps; 05/17/23: Unchanged, continued difficulty although performs daily; 08/25/2023: Pt endorses continued difficulty with use BUE support but performs often.    Time 12    Period Weeks    Status On-going    Target Date 11/17/2023      PT LONG TERM GOAL #4   Title Patient will complete a TUG test in no more than 13.5 seconds for independent mobility and decreased fall risk maintenance    Baseline 12/11/20: 13.4s; 03/02/21: 13.5s; 06/11/21: 11.4s; 08/20/21: 12.35s; 11/12/21: 12.2s; 12/21/21: 12.0s; 04/01/22: 11.3s; 06/21/22: 13.4s; 07/27/22: 13.3s; 10/28/22: 13.4s; 03/08/23: 12.2s; 05/17/23: 12.3s, 08/25/23: 12s   Time 12    Period Weeks    Status On-going    Target Date 11/17/2023      PT LONG TERM GOAL #5   Title Patient will increase  fastest 10 meter walk test to >0.81 m/s as to improve gait speed for better community ambulation and to reduce fall risk.    Baseline 12/11/20: 14.6s = 0.68 m/s; 03/02/21: 14.3s = 0.70 m/s; 06/11/21: 13.2s = 0.76 m/s; 08/20/21: 13.5s= 0.74 m/s; 11/12/21: 14.4s = 0.69 m/s; 12/21/21: 14.1s = 0.71 m/s; 04/01/22: 13.7s = 0.73 m/s; 06/21/22: 14.1s = 0.71  m/s; 07/27/22: 14.0s =  0.71 m/s; 10/28/22: 14.4s =  0.69 m/s; 03/08/23: 14.3s =  0.70 m/s; 05/17/23: 13.4s = 0.75 m/s, 08/25/2023: 0.71 m/s   Time 12    Period Weeks    Status Partially Met    Target Date 11/17/2023              Plan     Clinical Impression Statement Pt demonstrates excellent motivation today. Progressed balance and strength exercises during session today. Utilized [employer] today to challenge dynamic reaching. The Blaze Pod Random setting was chosen to enhance cognitive processing and agility, providing an unpredictable environment to simulate real-world scenarios, and fostering quick reactions and adaptability. Pt continues to report limited endurance. Plan to work on additional endurance exercises during future sessions. She will need a progress note at her next visit. Discharge attempted at the end of 2021 and pt demonstrated a drop in her function during her prolonged absence out of therapy despite good consistency with her HEP. Frequency was also decreased from every other week to once per month to find the minimum effective frequency, however, unfortunately this resulted in an unacceptable decline in patient's function as well. Plan is to continue frequency of one therapy session every other week throughout 2024 in order to maintain her function. Pt is regularly exercising at home with the assistance of family as needed/appropriate. She is unable to complete all necessary exercises safely at home either independently or with family assistance due to need from physical therapist to monitor and correct form and technique as well as to provide safe  guarding. Provided continued reinforcement regarding the importance of consistency with HEP and therapy sessions will focus on appropriate modifications to her HEP, family education, and maintenance of her function. Therapist will reassess patient's outcome measures at regular intervals to assess need for continued maintenance therapy frequency. Pt requires physical therapy intervention to maintain her functional status including LE strength, balance, and gait speed/quality and she has not achieved maximal benefit from therapy services. She will benefit from continued PT services to address these deficits and maintain her function at home and work.    Personal Factors and Comorbidities Comorbidity 1;Past/Current Experience;Time since onset of injury/illness/exacerbation;Transportation;Social Background    Comorbidities spina bifida    Examination-Activity Limitations Bend;Carry;Continence;Lift;Reach Overhead;Squat;Stairs;Stand;Transfers    Examination-Participation Restrictions Church;Community Activity;Driving;Laundry;Meal Prep;Volunteer;Yard Work    Stability/Clinical Decision Making Stable/Uncomplicated    Rehab Potential Good    Clinical Impairments Affecting Rehab Potential weakness and decreased standing balance    PT Frequency 1 visit every other week   PT Duration 12 weeks    PT Treatment/Interventions ADLs/Self Care Home Management; Moist Heat;DME Instruction;Gait training;Stair training;Functional mobility training;Neuromuscular re-education;Balance training;Therapeutic exercise;Therapeutic activities;Patient/family education;Manual techniques;;Energy conservation;   PT Next Visit Plan Progress note, review and modify HEP as appropriate, balance and strength exercises    PT Home Exercise Plan Seated, supine, and core program: [email or web address]    Consulted and Agree with Plan of Care Patient             Lynnea Maizes PT, DPT, GCS Jonella Redditt, PT 09/30/2023, 8:39 AM

## 2023-10-06 ENCOUNTER — Ambulatory Visit: Payer: Medicaid Other

## 2023-10-06 DIAGNOSIS — M6281 Muscle weakness (generalized): Secondary | ICD-10-CM

## 2023-10-06 DIAGNOSIS — R2681 Unsteadiness on feet: Secondary | ICD-10-CM

## 2023-10-16 NOTE — Therapy (Incomplete)
OUTPATIENT PHYSICAL THERAPY TREATMENT/PROGRESS PT  Dates of reporting period  05/02/23   to   10/16/23    Patient Name: Michelle Randall MRN: [id number] DOB:1996-05-28, 27 y.o., female Today's Date: 10/16/2023  PCP: Carren Rang, PA-C REFERRING PROVIDER: Carren Rang, PA-C    Past Medical History:  Diagnosis Date   Neurogenic bladder    Neurogenic bowel    S/P VP shunt    Spina bifida [facility])    Past Surgical History:  Procedure Laterality Date   VENTRICULOPERITONEAL SHUNT     Patient Active Problem List   Diagnosis Date Noted   Hydrocephalus with operating shunt (HCC) 03/17/2023   Malfunction of ventriculo-peritoneal shunt (HCC) 07/17/2018   Attention deficit 08/02/2017   Urinary incontinence 05/31/2016   Spina bifida (HCC) 07/29/2014   Neurogenic bladder 05/07/2013   Neurogenic bowel 04/29/2011   REFERRING DIAG: R26.81 (ICD-10-CM) - Unsteadiness on feet  THERAPY DIAG: Muscle weakness (generalized)  Unsteadiness on feet  PERTINENT HISTORY: Patient is a pleasant 27 year old female who presents for continuation of care for generalized weakness/ coordination deficits secondary to diagnosis of spina bifida. PMH includes VP shunt, spina bifida, and neurogenic bowel/bladder. Patient was discharged from this clinic at the end of 2021 due to meeting therapy cap for insurance that year. Patient reports that she has continued performing her HEP but notes a decline when she stops therapy due to inability to safely perform all of the balance and strength exercises as at home. One of the biggest challenge she is experiencing is stair navigation She reports having multiple near falls when negotiating stairs at home. Patient must ascend/descend stairs to reach bedroom upstairs increasing her risk for falls daily.   PRECAUTIONS: Fall risk  OUTCOME MEASURES: TEST 12/11/20 03/02/21 06/11/21 08/20/21 11/12/21 12/21/21 04/01/22 06/21/22 07/27/22 10/28/22 03/08/23 05/17/23 08/25/2023 Interpretation   5 times sit<>stand 10.6s 9.6s 8.1s Not Tested 7.4s 8.2s 8.3s 8.6s 7.7s 7.7s 7.6s 7.7s 8.2s WNL  10 meter walk test 14.6s = 0.68 m/s 14.3s = 0.70 m/s 13.2s =  0.76 m/s 13.5s=0.74 m/s 14.4s = 0.69 m/s 14.1s = 0.71 m/s 13.7s = 0.73 m/s 14.1s = 0.71 m/s 14.0s =  0.71 m/s 14.4s =  0.69 m/s 14.3s =  0.70 m/s 13.4s =  0.75 m/s 14.0s = 0.71 m/s <1.0 m/s indicates increased risk for falls; limited community ambulator  Timed up and Go 13.4s 13.5s 11.4s 12.35s 12.2s 12.0s 11.3s 13.4s 13.3s 13.4s 12.2s 12.3s 12.0 WNL, use of lofstrand crutches, >14 sec indicates increased risk for falls  2 minute walk test [employer] 264' with lofstrand crutches (post: HR: 150, 99%, BORG: 5/10) 233' with lofstrand crutches (post: HR: 145, 97%, BORG: 5/10) 258' with lofstrand crutches (post: BP: 123/74, HR: 130, 100%, BORG: 6/10) 230' with lofstrand crutches (post:, HR: 141, 100%, BORG: 6/10) 249' with lofstrand crutches (post: HR: 131 bpm, SpO2: 98%, BORG: 5/10) 252' with lofstrand crutches (post: HR: 134 bpm, SpO2: 98%, BORG: 8/10) 1000 feet is community ambulator  BERG Balance Assessment 32/56 32/56 33/56    34/56 34/56 34/56  35/56 31/56 29/56  33/56 33/56  33/56 32/56 <36/56 (100% risk for falls), 37-45 (80% risk for falls); 46-51 (>50% risk for falls); 52-55 (lower risk <25% of falls)  FOTO 51 49 53 53 49 50 45 47 45 50  49 51 53 Goal is to reach 59      TODAY'S TREATMENT:     SUBJECTIVE: Pt states that she is doing well today. She continues reporting  fatigue when walking across campus with her backpack. Denies recent falls. No specific questions upon arrival.   PAIN: Denies Resting Pain    Ther-ex  NuStep L1-3 x 5 minutes for warm-up at start of session during interval history; Forward walking lunges in // bars x 4 lengths; Side stepping in // bars with single UE support x 4 lengths; Sit to stand without UE support x 10;   Neuromuscular Re-education  Blaze pod  dynamic reaching using random setting and pods on mirror in circle pattern. Utilized 6 pods and 3 cycles of 60s each; Airex pad romberg stance, eyes closed x multiple bouts;  Airex pad romberg stance eyes open with horizontal and vertical head turns x multiple bouts; Airex pad romberg stance ball passes around body with therapist providing return pass to opposite side varying distance and height x multiple bouts on each side;   Not performed: Airex Step Up with Single and BUE support 1 x 20 taps;  Sit to stand on airex  2 x 10;  Forward Lunge on Wobble pad (pink) in // bars  BUE support, 1 x 8 BLE BOSU (round side up) step-ups alternating leading LE with BUE support x 10 on each side; Seated LAQ with manual resistance from therapist x 10 BLE; Airex staggered balance with front foot on 6" step x 60s on each side; Nautilus resisted gait with 20# forward, backward, R lateral, and L lateral x 3 each direction, pt using Lofstrand crutches; Seated adductor squeeze with manual resistance x 10; Seated alternating LAQ with manual resistance x 10 BLE; TG L22 single leg squats 2 x 10 BLE; TG L22 double leg jumps 2 x 10; 6" forward step ups alternating leading LE with BUE support x 10 on each side; Forward gait in // bars with single LUE support and balancing ball on top of agility cone in opposite UE for dual motor task x 2 lengths; Forward gait stepping over 1"x4" boards in // bars with single UE support x multiple laps;    PATIENT EDUCATION: Education details: Pt educated throughout session about proper posture and technique with exercises. Improved exercise technique, movement at target joints, use of target muscles after min to mod verbal, visual, tactile cues. Balance exercises and plan of care Person educated: Patient Education method: Explanation and Demonstration Education comprehension: verbalized understanding and returned demonstration   HOME EXERCISE PROGRAM: Seated, supine, and core  program: Medbridge Access Code: [email or web address]      PT Short Term Goals       PT SHORT TERM GOAL #1   Title Patient will report compliance with HEP for continued strengthening and stability during functional mobility.     Time 6    Period Weeks    Status Achieved              PT Long Term Goals      PT LONG TERM GOAL #1   Title Patient will increase her FOTO score to at least 59 to demonstrate increased function and mobility for higher quality of life.    Baseline 12/11/20: 51; 03/02/21: 49; 06/11/21: 53; 08/20/21: 53; 11/12/21: 49; 12/21/21: 50/ 04/01/22: 45; 06/21/22: 47; 07/27/22: 45; 10/28/22: 50; 03/08/23: 49; 05/17/23: 51 08/25/2023: 53   Time 12    Period Weeks    Status Partially Met    Target Date 11/17/2023      PT LONG TERM GOAL #2   Title Patient will maintain Berg Balance score of at least 32/56 in order to demonstrate  decreased fall risk during functional activities.    Baseline 12/11/20: 32/56; 03/02/21: 32/56; 06/11/21: 40/98; 08/20/2021: 11/91; 11/12/21: 34/56; 12/22/21: 34/56; 04/01/22: 35/56; 06/21/22: 31/56; 07/27/22: 29/56; 10/28/22: 33/56; 03/08/23: 33/56; 05/17/23: 33/56; 08/25/2023: 32/56   Time 12    Period Weeks    Status On-going    Target Date 11/17/2023      PT LONG TERM GOAL #3   Title Patient will be able to maintain safe performance with ascending/descending a flight of stairs using lofstrand crutches and railing in order to allow for safe navigation of her home environment.    Baseline 12/11/20: currently able to use lofstrands and/or railing with supervision by family; 03/02/21: Good maintenance observed; 06/11/21: Good maintenance observed; 08/20/2021: Good maintenance observed; 11/13/21: Good maintenance observed, family reports it continues to be the most challenging activity for patient; 12/22/21: Pt demonstrates good maintenance with stairs. She ascends/descends the stairs at home as often as she can without using her chair stairlift; 04/01/22: Pt demonstrates good maintenance  with stairs. She ascends/descends the stairs at home as often as she can without using her chair stairlift, pt also performs stairs in the community; 06/21/22: Pt and family reporting increased difficulty and fatigue when performing stairs at home. Pt reports that she is having more DOE; 07/27/22: Pt and family reporting more difficulty and fatigue when performing stairs at home as well as worsening DOE; 10/28/22: Pt reports she continues have difficulty with steps due to balance and strength deficits; 03/09/23: Pt continues with difficulty with steps; 05/17/23: Unchanged, continued difficulty although performs daily; 08/25/2023: Pt endorses continued difficulty with use BUE support but performs often.    Time 12    Period Weeks    Status On-going    Target Date 11/17/2023      PT LONG TERM GOAL #4   Title Patient will complete a TUG test in no more than 13.5 seconds for independent mobility and decreased fall risk maintenance    Baseline 12/11/20: 13.4s; 03/02/21: 13.5s; 06/11/21: 11.4s; 08/20/21: 12.35s; 11/12/21: 12.2s; 12/21/21: 12.0s; 04/01/22: 11.3s; 06/21/22: 13.4s; 07/27/22: 13.3s; 10/28/22: 13.4s; 03/08/23: 12.2s; 05/17/23: 12.3s, 08/25/23: 12s   Time 12    Period Weeks    Status On-going    Target Date 11/17/2023      PT LONG TERM GOAL #5   Title Patient will increase fastest 10 meter walk test to >0.81 m/s as to improve gait speed for better community ambulation and to reduce fall risk.    Baseline 12/11/20: 14.6s = 0.68 m/s; 03/02/21: 14.3s = 0.70 m/s; 06/11/21: 13.2s = 0.76 m/s; 08/20/21: 13.5s= 0.74 m/s; 11/12/21: 14.4s = 0.69 m/s; 12/21/21: 14.1s = 0.71 m/s; 04/01/22: 13.7s = 0.73 m/s; 06/21/22: 14.1s = 0.71 m/s; 07/27/22: 14.0s =  0.71 m/s; 10/28/22: 14.4s =  0.69 m/s; 03/08/23: 14.3s =  0.70 m/s; 05/17/23: 13.4s = 0.75 m/s, 08/25/2023: 0.71 m/s   Time 12    Period Weeks    Status Partially Met    Target Date 11/17/2023              Plan     Clinical Impression Statement Pt demonstrates excellent motivation  today. Updated outcome measures and goals during session. Her TUG, BERG, and 5TSTS all remained consistent today compared to the last update (12.0s, 32/56, and 8.2s respectively). Her 13m gait speed was relatively consistent with prior readings and once again she ambulated farther during the . Patient's FOTO increased to 53. Overall pt showed improvement in some outcome measures and for other demonstrated  excellent maintenance which is her primary goal for therapy. She is reporting more fatigue and DOE with ambulation and this is confirmed based on her subjective readings at the end of her . Plan to work on additional endurance exercises during session and pt confirms she is using her recumbent bike at home. Discharge attempted at the end of 2021 and pt demonstrated a drop in her function during her prolonged absence out of therapy despite good consistency with her HEP. Frequency was also decreased from every other week to once per month to find the minimum effective frequency, however, unfortunately this resulted in an unacceptable decline in patient's function as well. Plan is to continue frequency of one therapy session every other week throughout 2024 in order to maintain her function. Pt is regularly exercising at home with the assistance of family as needed/appropriate. She is unable to complete all necessary exercises safely at home either independently or with family assistance due to need from physical therapist to monitor and correct form and technique as well as to provide safe guarding. Provided continued reinforcement regarding the importance of consistency with HEP and therapy sessions will focus on appropriate modifications to her HEP, family education, and maintenance of her function. Therapist will reassess patient's outcome measures at regular intervals to assess need for continued maintenance therapy frequency. Pt requires physical therapy intervention to maintain her functional status  including LE strength, balance, and gait speed/quality and she has not achieved maximal benefit from therapy services. She will benefit from continued PT services to address these deficits and maintain her function at home and work.   Pt demonstrates excellent motivation today. Progressed balance and strength exercises during session today. Utilized [employer] today to challenge dynamic reaching. The Blaze Pod Random setting was chosen to enhance cognitive processing and agility, providing an unpredictable environment to simulate real-world scenarios, and fostering quick reactions and adaptability. Pt continues to report limited endurance. Plan to work on additional endurance exercises during future sessions. She will need a progress note at her next visit. Discharge attempted at the end of 2021 and pt demonstrated a drop in her function during her prolonged absence out of therapy despite good consistency with her HEP. Frequency was also decreased from every other week to once per month to find the minimum effective frequency, however, unfortunately this resulted in an unacceptable decline in patient's function as well. Plan is to continue frequency of one therapy session every other week throughout 2024 in order to maintain her function. Pt is regularly exercising at home with the assistance of family as needed/appropriate. She is unable to complete all necessary exercises safely at home either independently or with family assistance due to need from physical therapist to monitor and correct form and technique as well as to provide safe guarding. Provided continued reinforcement regarding the importance of consistency with HEP and therapy sessions will focus on appropriate modifications to her HEP, family education, and maintenance of her function. Therapist will reassess patient's outcome measures at regular intervals to assess need for continued maintenance therapy frequency. Pt requires physical therapy  intervention to maintain her functional status including LE strength, balance, and gait speed/quality and she has not achieved maximal benefit from therapy services. She will benefit from continued PT services to address these deficits and maintain her function at home and work.    Personal Factors and Comorbidities Comorbidity 1;Past/Current Experience;Time since onset of injury/illness/exacerbation;Transportation;Social Background    Comorbidities spina bifida    Examination-Activity Limitations Bend;Carry;Continence;Lift;Reach Overhead;Squat;Stairs;Stand;Transfers  Examination-Participation Restrictions Church;Community Activity;Driving;Laundry;Meal Prep;Volunteer;Yard Work    Stability/Clinical Decision Making Stable/Uncomplicated    Rehab Potential Good    Clinical Impairments Affecting Rehab Potential weakness and decreased standing balance    PT Frequency 1 visit every other week   PT Duration 12 weeks    PT Treatment/Interventions ADLs/Self Care Home Management; Moist Heat;DME Instruction;Gait training;Stair training;Functional mobility training;Neuromuscular re-education;Balance training;Therapeutic exercise;Therapeutic activities;Patient/family education;Manual techniques;;Energy conservation;   PT Next Visit Plan Progress note, review and modify HEP as appropriate, balance and strength exercises    PT Home Exercise Plan Seated, supine, and core program: [email or web address]    Consulted and Agree with Plan of Care Patient             Lynnea Maizes PT, DPT, [facility] [place], PT 10/16/2023, 9:28 PM

## 2023-10-17 ENCOUNTER — Ambulatory Visit: Payer: Medicaid Other

## 2023-10-17 DIAGNOSIS — R2681 Unsteadiness on feet: Secondary | ICD-10-CM

## 2023-10-17 DIAGNOSIS — M6281 Muscle weakness (generalized): Secondary | ICD-10-CM

## 2023-10-17 NOTE — Therapy (Signed)
OUTPATIENT PHYSICAL THERAPY TREATMENT/PROGRESS PT  Dates of reporting period  05/02/23   to   10/18/23    Patient Name: Michelle Randall MRN: [id number] DOB:[birth year], 27 y.o., female Today's Date: 10/18/2023  PCP: Carren Rang, PA-C REFERRING PROVIDER: Carren Rang, PA-C   PT End of Session - 10/18/23 1040     Visit Number 70    Number of Visits 100    Date for PT Re-Evaluation 11/17/23    Authorization Type eval: 12/11/20, recert: 03/08/23, 06/02/23    Authorization Time Period [employer] Berkley Harvey 10/28-12/29 for 5 PT visits  Medicaid 2024  WU:[id number] on auth  AUTH IS REQ  referring:Danielle Montez Morita    Authorization - Visit Number 2    Authorization - Number of Visits 5    PT Start Time 1025    PT Stop Time 1100    PT Time Calculation (min) 35 min    Equipment Utilized During Treatment Gait belt   Bilateral AFOs, lofstrand crutches   Activity Tolerance Patient tolerated treatment well    Behavior During Therapy WFL for tasks assessed/performed            Past Medical History:  Diagnosis Date   Neurogenic bladder    Neurogenic bowel    S/P VP shunt    Spina bifida (HCC)    Past Surgical History:  Procedure Laterality Date   VENTRICULOPERITONEAL SHUNT     Patient Active Problem List   Diagnosis Date Noted   Hydrocephalus with operating shunt (HCC) 03/17/2023   Malfunction of ventriculo-peritoneal shunt (HCC) 07/17/2018   Attention deficit 08/02/2017   Urinary incontinence 05/31/2016   Spina bifida (HCC) 07/29/2014   Neurogenic bladder 05/07/2013   Neurogenic bowel 04/29/2011   REFERRING DIAG: R26.81 (ICD-10-CM) - Unsteadiness on feet  THERAPY DIAG: Muscle weakness (generalized)  Unsteadiness on feet  PERTINENT HISTORY: Patient is a pleasant 27 year old female who presents for continuation of care for generalized weakness/ coordination deficits secondary to diagnosis of spina bifida. PMH includes VP shunt, spina bifida, and neurogenic bowel/bladder. Patient was  discharged from this clinic at the end of 2021 due to meeting therapy cap for insurance that year. Patient reports that she has continued performing her HEP but notes a decline when she stops therapy due to inability to safely perform all of the balance and strength exercises as at home. One of the biggest challenge she is experiencing is stair navigation She reports having multiple near falls when negotiating stairs at home. Patient must ascend/descend stairs to reach bedroom upstairs increasing her risk for falls daily.   PRECAUTIONS: Fall risk  OUTCOME MEASURES: TEST 12/11/20 03/02/21 06/11/21 08/20/21 11/12/21 12/21/21 04/01/22 06/21/22 07/27/22 10/28/22 03/08/23 05/17/23 08/25/2023 Interpretation  5 times sit<>stand 10.6s 9.6s 8.1s Not Tested 7.4s 8.2s 8.3s 8.6s 7.7s 7.7s 7.6s 7.7s 8.2s WNL  10 meter walk test 14.6s = 0.68 m/s 14.3s = 0.70 m/s 13.2s =  0.76 m/s 13.5s=0.74 m/s 14.4s = 0.69 m/s 14.1s = 0.71 m/s 13.7s = 0.73 m/s 14.1s = 0.71 m/s 14.0s =  0.71 m/s 14.4s =  0.69 m/s 14.3s =  0.70 m/s 13.4s =  0.75 m/s 14.0s = 0.71 m/s <1.0 m/s indicates increased risk for falls; limited community ambulator  Timed up and Go 13.4s 13.5s 11.4s 12.35s 12.2s 12.0s 11.3s 13.4s 13.3s 13.4s 12.2s 12.3s 12.0 WNL, use of lofstrand crutches, >14 sec indicates increased risk for falls  2 minute walk test [employer] Def[employer] 264' with lofstrand crutches (post: HR:  150, 99%, BORG: 5/10) 233' with lofstrand crutches (post: HR: 145, 97%, BORG: 5/10) 258' with lofstrand crutches (post: BP: 123/74, HR: 130, 100%, BORG: 6/10) 230' with lofstrand crutches (post:, HR: 141, 100%, BORG: 6/10) 249' with lofstrand crutches (post: HR: 131 bpm, SpO2: 98%, BORG: 5/10) 252' with lofstrand crutches (post: HR: 134 bpm, SpO2: 98%, BORG: 8/10) 1000 feet is community ambulator  BERG Balance Assessment 32/56 32/56 33/56    34/56 34/56 34/56  35/56 31/56 29/56  33/56 33/56  33/56 32/56 <36/56 (100% risk for  falls), 37-45 (80% risk for falls); 46-51 (>50% risk for falls); 52-55 (lower risk <25% of falls)  FOTO 51 49 53 53 49 50 45 47 45 50  49 51 53 Goal is to reach 59      TODAY'S TREATMENT:     SUBJECTIVE: Pt states that she is doing well today. No issues since the last therapy session. No recent falls reported. Remains consistent with HEP No specific questions upon arrival.   PAIN: Denies resting pain    Ther-ex  Total Gym (TG) L22 double leg squats 2 x 10; TG L22 single leg squats 2 x 10 BLE; Forward walking lunges in // bars x 2 lengths; Forward walking lunges in // bars with 2# dumbbell overhead hold x 2 lengths;  Side stepping in // bars with single UE support x 2 lengths; Sit to stand without UE support 2 x 10; Seated LAQ with 3# ankle weights (AW) x 10 BLE; Standing marches with 2# AW x 10 BLE; Standing hip abduction with 2# AW x 10 BLE; Forward high knee marching in // bars with 2# AW x multiple lengths; Mini squats x 10;   Neuromuscular Re-education  Forward gait in // bars with single LUE support and balancing ball on top of agility cone in opposite UE for dual motor task x 2 lengths; Airex balance beam side stepping with single UE support x multiple lengths; Romberg stance eyes open with horizontal and vertical head turns x multiple bouts;   Not performed: Airex Step Up with Single and BUE support 1 x 20 taps;  Forward Lunge on Wobble pad (pink) in // bars  BUE support, 1 x 8 BLE BOSU (round side up) step-ups alternating leading LE with BUE support x 10 on each side; Airex staggered balance with front foot on 6" step x 60s on each side; Nautilus resisted gait with 20# forward, backward, R lateral, and L lateral x 3 each direction, pt using Lofstrand crutches; Seated adductor squeeze with manual resistance x 10; Seated alternating LAQ with manual resistance x 10 BLE; 6" forward step ups alternating leading LE with BUE support x 10 on each side; Forward gait stepping  over 1"x4" boards in // bars with single UE support x multiple laps;    PATIENT EDUCATION: Education details: Pt educated throughout session about proper posture and technique with exercises. Improved exercise technique, movement at target joints, use of target muscles after min to mod verbal, visual, tactile cues. Balance exercises and plan of care Person educated: Patient Education method: Explanation and Demonstration Education comprehension: verbalized understanding and returned demonstration   HOME EXERCISE PROGRAM: Seated, supine, and core program: Medbridge Access Code: [email or web address]      PT Short Term Goals       PT SHORT TERM GOAL #1   Title Patient will report compliance with HEP for continued strengthening and stability during functional mobility.     Time 6    Period Weeks    Status Achieved  PT Long Term Goals      PT LONG TERM GOAL #1   Title Patient will increase her FOTO score to at least 59 to demonstrate increased function and mobility for higher quality of life.    Baseline 12/11/20: 51; 03/02/21: 49; 06/11/21: 53; 08/20/21: 53; 11/12/21: 49; 12/21/21: 50/ 04/01/22: 45; 06/21/22: 47; 07/27/22: 45; 10/28/22: 50; 03/08/23: 49; 05/17/23: 51 08/25/2023: 53   Time 12    Period Weeks    Status Partially Met    Target Date 11/17/2023      PT LONG TERM GOAL #2   Title Patient will maintain Berg Balance score of at least 32/56 in order to demonstrate decreased fall risk during functional activities.    Baseline 12/11/20: 32/56; 03/02/21: 32/56; 06/11/21: 95/62; 08/20/2021: 13/08; 11/12/21: 34/56; 12/22/21: 34/56; 04/01/22: 35/56; 06/21/22: 31/56; 07/27/22: 29/56; 10/28/22: 33/56; 03/08/23: 33/56; 05/17/23: 33/56; 08/25/2023: 32/56   Time 12    Period Weeks    Status On-going    Target Date 11/17/2023      PT LONG TERM GOAL #3   Title Patient will be able to maintain safe performance with ascending/descending a flight of stairs using lofstrand crutches and railing in order to  allow for safe navigation of her home environment.    Baseline 12/11/20: currently able to use lofstrands and/or railing with supervision by family; 03/02/21: Good maintenance observed; 06/11/21: Good maintenance observed; 08/20/2021: Good maintenance observed; 11/13/21: Good maintenance observed, family reports it continues to be the most challenging activity for patient; 12/22/21: Pt demonstrates good maintenance with stairs. She ascends/descends the stairs at home as often as she can without using her chair stairlift; 04/01/22: Pt demonstrates good maintenance with stairs. She ascends/descends the stairs at home as often as she can without using her chair stairlift, pt also performs stairs in the community; 06/21/22: Pt and family reporting increased difficulty and fatigue when performing stairs at home. Pt reports that she is having more DOE; 07/27/22: Pt and family reporting more difficulty and fatigue when performing stairs at home as well as worsening DOE; 10/28/22: Pt reports she continues have difficulty with steps due to balance and strength deficits; 03/09/23: Pt continues with difficulty with steps; 05/17/23: Unchanged, continued difficulty although performs daily; 08/25/2023: Pt endorses continued difficulty with use BUE support but performs often.    Time 12    Period Weeks    Status On-going    Target Date 11/17/2023      PT LONG TERM GOAL #4   Title Patient will complete a TUG test in no more than 13.5 seconds for independent mobility and decreased fall risk maintenance    Baseline 12/11/20: 13.4s; 03/02/21: 13.5s; 06/11/21: 11.4s; 08/20/21: 12.35s; 11/12/21: 12.2s; 12/21/21: 12.0s; 04/01/22: 11.3s; 06/21/22: 13.4s; 07/27/22: 13.3s; 10/28/22: 13.4s; 03/08/23: 12.2s; 05/17/23: 12.3s, 08/25/23: 12s   Time 12    Period Weeks    Status On-going    Target Date 11/17/2023      PT LONG TERM GOAL #5   Title Patient will increase fastest 10 meter walk test to >0.81 m/s as to improve gait speed for better community  ambulation and to reduce fall risk.    Baseline 12/11/20: 14.6s = 0.68 m/s; 03/02/21: 14.3s = 0.70 m/s; 06/11/21: 13.2s = 0.76 m/s; 08/20/21: 13.5s= 0.74 m/s; 11/12/21: 14.4s = 0.69 m/s; 12/21/21: 14.1s = 0.71 m/s; 04/01/22: 13.7s = 0.73 m/s; 06/21/22: 14.1s = 0.71 m/s; 07/27/22: 14.0s =  0.71 m/s; 10/28/22: 14.4s =  0.69 m/s; 03/08/23: 14.3s =  0.70 m/s; 05/17/23:  13.4s = 0.75 m/s, 08/25/2023: 0.71 m/s   Time 12    Period Weeks    Status Partially Met    Target Date 11/17/2023              Plan     Clinical Impression Statement Pt demonstrates excellent motivation today. Updated outcome measures and goals during 08/25/23 visit. At that time her TUG, BERG, and 5TSTS all remained consistent compared to the prior update (12.0s, 32/56, and 8.2s respectively). Her 37m gait speed was relatively consistent with prior readings and once again she ambulated farther during the . Patient's FOTO increased to 53. Overall pt showed improvement in some outcome measures and for others demonstrated excellent maintenance which is her primary goal for therapy. She is reporting more fatigue and DOE with ambulation and this is confirmed based on her subjective readings at the end of her . Plan to work on additional endurance exercises during session and pt confirms she is using her recumbent bike at home. Progressed both strengthening, endurance, and balance exercises during today's session. Discharge attempted at the end of 2021 and pt demonstrated a drop in her function during her prolonged absence out of therapy despite good consistency with her HEP. Frequency was also decreased from every other week to once per month to find the minimum effective frequency, however, unfortunately this resulted in an unacceptable decline in patient's function as well. Plan is to continue frequency of one therapy session every other week throughout 2024 in order to maintain her function. Pt is regularly exercising at home with the assistance  of family as needed/appropriate. She is unable to complete all necessary exercises safely at home either independently or with family assistance due to need from physical therapist to monitor and correct form and technique as well as to provide safe guarding. Provided continued reinforcement regarding the importance of consistency with HEP and therapy sessions will focus on appropriate modifications to her HEP, family education, and maintenance of her function. Therapist will reassess patient's outcome measures at regular intervals to assess need for continued maintenance therapy frequency. Pt requires physical therapy intervention to maintain her functional status including LE strength, balance, and gait speed/quality and she has not achieved maximal benefit from therapy services. She will benefit from continued PT services to address these deficits and maintain her function at home and work.   Personal Factors and Comorbidities Comorbidity 1;Past/Current Experience;Time since onset of injury/illness/exacerbation;Transportation;Social Background    Comorbidities spina bifida    Examination-Activity Limitations Bend;Carry;Continence;Lift;Reach Overhead;Squat;Stairs;Stand;Transfers    Examination-Participation Restrictions Church;Community Activity;Driving;Laundry;Meal Prep;Volunteer;Yard Work    Stability/Clinical Decision Making Stable/Uncomplicated    Rehab Potential Good    Clinical Impairments Affecting Rehab Potential weakness and decreased standing balance    PT Frequency 1 visit every other week   PT Duration 12 weeks    PT Treatment/Interventions ADLs/Self Care Home Management; Moist Heat;DME Instruction;Gait training;Stair training;Functional mobility training;Neuromuscular re-education;Balance training;Therapeutic exercise;Therapeutic activities;Patient/family education;Manual techniques;;Energy conservation;   PT Next Visit Plan review and modify HEP as appropriate, balance and strength  exercises    PT Home Exercise Plan Seated, supine, and core program: [email or web address]    Consulted and Agree with Plan of Care Patient             Lynnea Maizes PT, DPT, [facility] Talishia Betzler, PT 10/18/2023, 12:09 PM

## 2023-10-18 ENCOUNTER — Ambulatory Visit: Payer: Medicaid Other | Attending: Student

## 2023-10-18 DIAGNOSIS — R2681 Unsteadiness on feet: Secondary | ICD-10-CM | POA: Diagnosis present

## 2023-10-18 DIAGNOSIS — M6281 Muscle weakness (generalized): Secondary | ICD-10-CM | POA: Diagnosis present

## 2023-10-20 ENCOUNTER — Ambulatory Visit: Payer: Medicaid Other

## 2023-11-03 ENCOUNTER — Ambulatory Visit: Payer: Medicaid Other

## 2023-11-03 DIAGNOSIS — M6281 Muscle weakness (generalized): Secondary | ICD-10-CM

## 2023-11-03 DIAGNOSIS — R2681 Unsteadiness on feet: Secondary | ICD-10-CM

## 2023-11-03 NOTE — Therapy (Signed)
OUTPATIENT PHYSICAL THERAPY TREATMENT/GOAL UPDATE   Patient Name: Michelle Randall MRN: [id number] DOB:[birth year], 27 y.o., female Today's Date: 11/04/2023  PCP: Carren Rang, PA-C REFERRING PROVIDER: Carren Rang, PA-C   PT End of Session - 11/03/23 1430     Visit Number 71    Number of Visits 100    Date for PT Re-Evaluation 11/17/23    Authorization Type eval: 12/11/20, recert: 03/08/23, 06/02/23    Authorization Time Period [facility] auth 10/28-12/29 for 5 PT visits  Medicaid 2024  [id number] on auth  AUTH IS REQ  referring:Danielle Montez Morita    Authorization - Visit Number 3    Authorization - Number of Visits 5    PT Start Time 1445    PT Stop Time 1530    PT Time Calculation (min) 45 min    Equipment Utilized During Treatment Gait belt   Bilateral AFOs, lofstrand crutches   Activity Tolerance Patient tolerated treatment well    Behavior During Therapy WFL for tasks assessed/performed             Past Medical History:  Diagnosis Date   Neurogenic bladder    Neurogenic bowel    S/P VP shunt    Spina bifida (HCC)    Past Surgical History:  Procedure Laterality Date   VENTRICULOPERITONEAL SHUNT     Patient Active Problem List   Diagnosis Date Noted   Hydrocephalus with operating shunt (HCC) 03/17/2023   Malfunction of ventriculo-peritoneal shunt (HCC) 07/17/2018   Attention deficit 08/02/2017   Urinary incontinence 05/31/2016   Spina bifida (HCC) 07/29/2014   Neurogenic bladder 05/07/2013   Neurogenic bowel 04/29/2011   REFERRING DIAG: R26.81 (ICD-10-CM) - Unsteadiness on feet  THERAPY DIAG: Muscle weakness (generalized)  Unsteadiness on feet  PERTINENT HISTORY: Patient is a pleasant 28 year old female who presents for continuation of care for generalized weakness/ coordination deficits secondary to diagnosis of spina bifida. PMH includes VP shunt, spina bifida, and neurogenic bowel/bladder. Patient was discharged from this clinic at the end of 2021 due to  meeting therapy cap for insurance that year. Patient reports that she has continued performing her HEP but notes a decline when she stops therapy due to inability to safely perform all of the balance and strength exercises as at home. One of the biggest challenge she is experiencing is stair navigation She reports having multiple near falls when negotiating stairs at home. Patient must ascend/descend stairs to reach bedroom upstairs increasing her risk for falls daily.   PRECAUTIONS: Fall risk  OUTCOME MEASURES: TEST 12/11/20 03/02/21 06/11/21 08/20/21 11/12/21 12/21/21 04/01/22 06/21/22 07/27/22 10/28/22 03/08/23 05/17/23 08/25/2023 Interpretation  5 times sit<>stand 10.6s 9.6s 8.1s Not Tested 7.4s 8.2s 8.3s 8.6s 7.7s 7.7s 7.6s 7.7s 8.2s WNL  10 meter walk test 14.6s = 0.68 m/s 14.3s = 0.70 m/s 13.2s =  0.76 m/s 13.5s=0.74 m/s 14.4s = 0.69 m/s 14.1s = 0.71 m/s 13.7s = 0.73 m/s 14.1s = 0.71 m/s 14.0s =  0.71 m/s 14.4s =  0.69 m/s 14.3s =  0.70 m/s 13.4s =  0.75 m/s 14.0s = 0.71 m/s <1.0 m/s indicates increased risk for falls; limited community ambulator  Timed up and Go 13.4s 13.5s 11.4s 12.35s 12.2s 12.0s 11.3s 13.4s 13.3s 13.4s 12.2s 12.3s 12.0 WNL, use of lofstrand crutches, >14 sec indicates increased risk for falls  2 minute walk test Deferred [employer] Deferred Deferred Deferred Deferred Deferred 59' with lofstrand crutches (post: HR: 150, 99%, BORG: 5/10) 233' with lofstrand crutches (post: HR: 145, 97%, BORG:  5/10) 258' with lofstrand crutches (post: BP: 123/74, HR: 130, 100%, BORG: 6/10) 230' with lofstrand crutches (post:, HR: 141, 100%, BORG: 6/10) 249' with lofstrand crutches (post: HR: 131 bpm, SpO2: 98%, BORG: 5/10) 252' with lofstrand crutches (post: HR: 134 bpm, SpO2: 98%, BORG: 8/10) 1000 feet is community ambulator  BERG Balance Assessment 32/56 32/56 33/56    34/56 34/56 34/56  35/56 31/56 29/56  33/56 33/56  33/56 32/56 <36/56 (100% risk for falls), 37-45 (80% risk for falls); 46-51 (>50% risk for  falls); 52-55 (lower risk <25% of falls)  FOTO 51 49 53 53 49 50 45 47 45 50  49 51 53 Goal is to reach 59      TODAY'S TREATMENT:     SUBJECTIVE: Pt states that she is doing well today. No issues since the last therapy session. No recent falls reported.  All home DME working properly. Remains consistent with HEP No specific questions upon arrival.   PAIN: Denies resting pain    Neuromuscular Re-education   OUTCOME MEASURES:  TEST 12/11/20 03/02/21 06/11/21 08/20/21 11/12/21 12/21/21 04/01/22 06/21/22 07/27/22 10/28/22 03/08/23 05/17/23 08/25/2023 11/03/23 Interpretation  5 times sit<>stand 10.6s 9.6s 8.1s Not Tested 7.4s 8.2s 8.3s 8.6s 7.7s 7.7s 7.6s 7.7s 8.2s 7.5s WNL  10 meter walk test 14.6s = 0.68 m/s 14.3s = 0.70 m/s 13.2s =  0.76 m/s 13.5s=0.74 m/s 14.4s = 0.69 m/s 14.1s = 0.71 m/s 13.7s = 0.73 m/s 14.1s = 0.71 m/s 14.0s =  0.71 m/s 14.4s =  0.69 m/s 14.3s =  0.70 m/s 13.4s =  0.75 m/s 14.0s = 0.71 m/s 13.5s = 0.74 m/s <1.0 m/s indicates increased risk for falls; limited community ambulator  Timed up and Go 13.4s 13.5s 11.4s 12.35s 12.2s 12.0s 11.3s 13.4s 13.3s 13.4s 12.2s 12.3s 12.0 11.8s WNL, use of lofstrand crutches, >14 sec indicates increased risk for falls  2 minute walk test Deferred Deferred Deferred Deferred Deferred Deferred Deferred 264' with lofstrand crutches (post: HR: 150, 99%, BORG: 5/10) 233' with lofstrand crutches (post: HR: 145, 97%, BORG: 5/10) 258' with lofstrand crutches (post: BP: 123/74, HR: 130, 100%, BORG: 6/10) 230' with lofstrand crutches (post:, HR: 141, 100%, BORG: 6/10) 249' with lofstrand crutches (post: HR: 131 bpm, SpO2: 98%, BORG: 5/10) 252' with lofstrand crutches (post: HR: 134 bpm, SpO2: 98%, BORG: 8/10) 267' with lofstrand crutches (post: HR: 155 bpm, SpO2: 99%, BORG: 5/10) 1000 feet is community ambulator  BERG Balance Assessment 32/56 32/56 33/56    34/56 34/56 34/56  35/56 31/56 29/56  33/56 33/56  33/56 32/56 32/56  <36/56 (100% risk for falls), 37-45 (80%  risk for falls); 46-51 (>50% risk for falls); 52-55 (lower risk <25% of falls)  FOTO 51 49 53 53 49 50 45 47 45 50  49 51 53 49 Goal is to reach 59    Ther-ex  Forward walking lunges in // bars x 2 lengths; Mini squats with finger tap support x 10; Seated LAQ and HS curls with manual resistance from therapist x 10; Forward walking lunges in // bars with 2# dumbbell overhead hold x 2 lengths;     Not performed: Airex Step Up with Single and BUE support 1 x 20 taps;  Forward Lunge on Wobble pad (pink) in // bars  BUE support, 1 x 8 BLE BOSU (round side up) step-ups alternating leading LE with BUE support x 10 on each side; Airex staggered balance with front foot on 6" step x 60s on each side; Nautilus resisted gait with 20# forward, backward, R lateral, and L  lateral x 3 each direction, pt using Lofstrand crutches; Seated adductor squeeze with manual resistance x 10; Seated alternating LAQ with manual resistance x 10 BLE; 6" forward step ups alternating leading LE with BUE support x 10 on each side; Forward gait stepping over 1"x4" boards in // bars with single UE support x multiple laps; Total Gym (TG) L22 double leg squats 2 x 10; TG L22 single leg squats 2 x 10 BLE; Side stepping in // bars with single UE support x 2 lengths; Sit to stand without UE support 2 x 10; Standing marches with 2# AW x 10 BLE; Standing hip abduction with 2# AW x 10 BLE; Forward high knee marching in // bars with 2# AW x multiple lengths; Airex balance beam side stepping with single UE support x multiple lengths; Romberg stance eyes open with horizontal and vertical head turns x multiple bouts;    PATIENT EDUCATION: Education details: Plan of care, outcome measures. Pt educated throughout session about proper posture and technique with strengthening exercises. Improved exercise technique, movement at target joints, use of target muscles after min to mod verbal, visual, tactile cues.  Person educated:  Patient Education method: Medical illustrator Education comprehension: verbalized understanding and returned demonstration   HOME EXERCISE PROGRAM: Seated, supine, and core program: Medbridge Access Code: [email or web address]      PT Short Term Goals       PT SHORT TERM GOAL #1   Title Patient will report compliance with HEP for continued strengthening and stability during functional mobility.     Time 6    Period Weeks    Status Achieved              PT Long Term Goals      PT LONG TERM GOAL #1   Title Patient will increase her FOTO score to at least 59 to demonstrate increased function and mobility for higher quality of life.    Baseline 12/11/20: 51; 03/02/21: 49; 06/11/21: 53; 08/20/21: 53; 11/12/21: 49; 12/21/21: 50/ 04/01/22: 45; 06/21/22: 47; 07/27/22: 45; 10/28/22: 50; 03/08/23: 49; 05/17/23: 51 08/25/2023: 53; 11/03/23: 49;   Time 12    Period Weeks    Status Partially Met    Target Date 11/17/2023      PT LONG TERM GOAL #2   Title Patient will maintain Berg Balance score of at least 32/56 in order to demonstrate decreased fall risk during functional activities.    Baseline 12/11/20: 32/56; 03/02/21: 32/56; 06/11/21: 33/56; 08/20/2021: 16/10; 11/12/21: 34/56; 12/22/21: 34/56; 04/01/22: 35/56; 06/21/22: 31/56; 07/27/22: 29/56; 10/28/22: 33/56; 03/08/23: 33/56; 05/17/23: 33/56; 08/25/2023: 32/56; 11/03/23: 32/56;   Time 12    Period Weeks    Status On-going    Target Date 11/17/2023      PT LONG TERM GOAL #3   Title Patient will be able to maintain safe performance with ascending/descending a flight of stairs using lofstrand crutches and railing in order to allow for safe navigation of her home environment.    Baseline 12/11/20: currently able to use lofstrands and/or railing with supervision by family; 03/02/21: Good maintenance observed; 06/11/21: Good maintenance observed; 08/20/2021: Good maintenance observed; 11/13/21: Good maintenance observed, family reports it continues to be the most  challenging activity for patient; 12/22/21: Pt demonstrates good maintenance with stairs. She ascends/descends the stairs at home as often as she can without using her chair stairlift; 04/01/22: Pt demonstrates good maintenance with stairs. She ascends/descends the stairs at home as often as she can without using her  chair stairlift, pt also performs stairs in the community; 06/21/22: Pt and family reporting increased difficulty and fatigue when performing stairs at home. Pt reports that she is having more DOE; 07/27/22: Pt and family reporting more difficulty and fatigue when performing stairs at home as well as worsening DOE; 10/28/22: Pt reports she continues have difficulty with steps due to balance and strength deficits; 03/09/23: Pt continues with difficulty with steps; 05/17/23: Unchanged, continued difficulty although performs daily; 08/25/2023: Pt endorses continued difficulty with use BUE support but performs often. 11/03/2023: Pt endorses continued difficulty. She requires BUE support. Performs stairs daily but notices worsening endurance lately.   Time 12    Period Weeks    Status On-going    Target Date 11/17/2023      PT LONG TERM GOAL #4   Title Patient will complete a TUG test in no more than 13.5 seconds for independent mobility and decreased fall risk maintenance    Baseline 12/11/20: 13.4s; 03/02/21: 13.5s; 06/11/21: 11.4s; 08/20/21: 12.35s; 11/12/21: 12.2s; 12/21/21: 12.0s; 04/01/22: 11.3s; 06/21/22: 13.4s; 07/27/22: 13.3s; 10/28/22: 13.4s; 03/08/23: 12.2s; 05/17/23: 12.3s, 08/25/23: 12s; 11/13/23: 11.8s   Time 12    Period Weeks    Status ACHIEVED   Target Date 11/17/2023      PT LONG TERM GOAL #5   Title Patient will increase fastest 10 meter walk test to >0.81 m/s as to improve gait speed for better community ambulation and to reduce fall risk.    Baseline 12/11/20: 14.6s = 0.68 m/s; 03/02/21: 14.3s = 0.70 m/s; 06/11/21: 13.2s = 0.76 m/s; 08/20/21: 13.5s= 0.74 m/s; 11/12/21: 14.4s = 0.69 m/s; 12/21/21: 14.1s  = 0.71 m/s; 04/01/22: 13.7s = 0.73 m/s; 06/21/22: 14.1s = 0.71 m/s; 07/27/22: 14.0s =  0.71 m/s; 10/28/22: 14.4s =  0.69 m/s; 03/08/23: 14.3s =  0.70 m/s; 05/17/23: 13.4s = 0.75 m/s, 08/25/2023: 0.71 m/s, 11/03/23: 13.5s = 0.74 m/s   Time 12    Period Weeks    Status Partially Met    Target Date 11/17/2023              Plan     Clinical Impression Statement Pt demonstrates excellent motivation today. Updated outcome measures and goals during visit today. Her TUG, BERG, and 5TSTS all remained consistent compared to the prior update. Her 52m gait speed was relatively consistent with prior readings and once again she ambulated farther during the . In fact she ambulated the farthest today she has ever ambulated since we first performed the in August of 2023. Patient's FOTO unfortunately declined to 49 indicating decreased self-perception of function related to her disability. Overall pt showed excellent maintenance of her objective outcome measures which is her primary goal for therapy. She continues reporting more fatigue and DOE with ambulation across campus with her backpack. Plan to work on additional endurance exercises during session and pt confirms she is using her recumbent bike at home. With time left at end of session progressed strengthening exercises. Discharge attempted at the end of 2021 and pt demonstrated a drop in her function during her prolonged absence out of therapy despite good consistency with her HEP. Frequency was also decreased from every other week to once per month to find the minimum effective frequency, however, unfortunately this resulted in an unacceptable decline in patient's function as well. Plan is to continue frequency of one therapy session every other week throughout 2025 in order to maintain her function. Pt is regularly exercising at home with the assistance of family as  needed/appropriate. She is unable to complete all necessary exercises safely at home either  independently or with family assistance due to need from physical therapist to monitor and correct form and technique as well as to provide safe guarding. Provided continued reinforcement regarding the importance of consistency with HEP and therapy sessions will focus on appropriate modifications to her HEP, family education, and maintenance of her function. Therapist will reassess patient's outcome measures at regular intervals to assess need for continued maintenance therapy frequency. Pt requires physical therapy intervention to maintain her functional status including LE strength, balance, and gait speed/quality and she has not achieved maximal benefit from therapy services. She will benefit from continued PT services to address these deficits and maintain her function at home and work.   Personal Factors and Comorbidities Comorbidity 1;Past/Current Experience;Time since onset of injury/illness/exacerbation;Transportation;Social Background    Comorbidities spina bifida    Examination-Activity Limitations Bend;Carry;Continence;Lift;Reach Overhead;Squat;Stairs;Stand;Transfers    Examination-Participation Restrictions Church;Community Activity;Driving;Laundry;Meal Prep;Volunteer;Yard Work    Stability/Clinical Decision Making Stable/Uncomplicated    Rehab Potential Good    Clinical Impairments Affecting Rehab Potential weakness and decreased standing balance    PT Frequency 1 visit every other week   PT Duration 12 weeks    PT Treatment/Interventions ADLs/Self Care Home Management; ;DME Instruction;Gait training;Stair training;Functional mobility training;Neuromuscular re-education;Balance training;Therapeutic exercise;Therapeutic activities;Patient/family education;Manual techniques;Energy conservation;   PT Next Visit Plan review and modify HEP as appropriate, balance and strength exercises    PT Home Exercise Plan Seated, supine, and core program: [email or web address]    Consulted and Agree with Plan of Care  Patient             Lynnea Maizes PT, DPT, G[facility] Delbra Zellars, PT 11/04/2023, 12:24 PM

## 2023-11-11 NOTE — Therapy (Signed)
 OUTPATIENT PHYSICAL THERAPY TREATMENT/RECERTIFICATION   Patient Name: Michelle Randall MRN: [id number] DOB:December 05, 1995, 27 y.o., female Today's Date: 11/15/2023  PCP: Franchot Houston, PA-C REFERRING PROVIDER: Franchot Houston, PA-C   PT End of Session - 11/15/23 0802     Visit Number 72    Number of Visits 112    Date for PT Re-Evaluation 02/07/24    Authorization Type eval: 12/11/20, recert: 03/08/23, 06/02/23    Authorization Time Period [facility] auth 10/28-12/29 for 5 PT visits  Medicaid 2024  CO:[place] on auth  AUTH IS REQ  referring:Danielle Franchot    Authorization - Number of Visits 5    PT Start Time 0802    PT Stop Time 0845    PT Time Calculation (min) 43 min    Equipment Utilized During Treatment Gait belt   Bilateral AFOs, lofstrand crutches   Activity Tolerance Patient tolerated treatment well    Behavior During Therapy WFL for tasks assessed/performed            Past Medical History:  Diagnosis Date   Neurogenic bladder    Neurogenic bowel    S/P VP shunt    Spina bifida (HCC)    Past Surgical History:  Procedure Laterality Date   VENTRICULOPERITONEAL SHUNT     Patient Active Problem List   Diagnosis Date Noted   Hydrocephalus with operating shunt (HCC) 03/17/2023   Malfunction of ventriculo-peritoneal shunt (HCC) 07/17/2018   Attention deficit 08/02/2017   Urinary incontinence 05/31/2016   Spina bifida (HCC) 07/29/2014   Neurogenic bladder 05/07/2013   Neurogenic bowel 04/29/2011   REFERRING DIAG: R26.81 (ICD-10-CM) - Unsteadiness on feet  THERAPY DIAG: Muscle weakness (generalized)  Unsteadiness on feet  PERTINENT HISTORY: Patient is a pleasant 27 year old female who presents for continuation of care for generalized weakness/ coordination deficits secondary to diagnosis of spina bifida. PMH includes VP shunt, spina bifida, and neurogenic bowel/bladder. Patient was discharged from this clinic at the end of 2021 due to meeting therapy cap for insurance  that year. Patient reports that she has continued performing her HEP but notes a decline when she stops therapy due to inability to safely perform all of the balance and strength exercises as at home. One of the biggest challenge she is experiencing is stair navigation She reports having multiple near falls when negotiating stairs at home. Patient must ascend/descend stairs to reach bedroom upstairs increasing her risk for falls daily.   PRECAUTIONS: Fall risk  OUTCOME MEASURES: TEST 12/11/20 03/02/21 06/11/21 08/20/21 11/12/21 12/21/21 04/01/22 06/21/22 07/27/22 10/28/22 03/08/23 05/17/23 08/25/2023 11/03/23 Interpretation  5 times sit<>stand 10.6s 9.6s 8.1s Not Tested 7.4s 8.2s 8.3s 8.6s 7.7s 7.7s 7.6s 7.7s 8.2s 7.5s WNL  10 meter walk test 14.6s = 0.68 m/s 14.3s = 0.70 m/s 13.2s =  0.76 m/s 13.5s=0.74 m/s 14.4s = 0.69 m/s 14.1s = 0.71 m/s 13.7s = 0.73 m/s 14.1s = 0.71 m/s 14.0s =  0.71 m/s 14.4s =  0.69 m/s 14.3s =  0.70 m/s 13.4s =  0.75 m/s 14.0s = 0.71 m/s 13.5s = 0.74 m/s <1.0 m/s indicates increased risk for falls; limited community ambulator  Timed up and Go 13.4s 13.5s 11.4s 12.35s 12.2s 12.0s 11.3s 13.4s 13.3s 13.4s 12.2s 12.3s 12.0 11.8s WNL, use of lofstrand crutches, >14 sec indicates increased risk for falls  2 minute walk test [employer] Deferred Deferred [employer]erred 264' with lofstrand crutches (post: HR: 150, 99%, BORG: 5/10) 233' with lofstrand crutches (post: HR: 145, 97%, BORG: 5/10) 258' with  lofstrand crutches (post: BP: 123/74, HR: 130, 100%, BORG: 6/10) 230' with lofstrand crutches (post:, HR: 141, 100%, BORG: 6/10) 249' with lofstrand crutches (post: HR: 131 bpm, SpO2: 98%, BORG: 5/10) 252' with lofstrand crutches (post: HR: 134 bpm, SpO2: 98%, BORG: 8/10) 267' with lofstrand crutches (post: HR: 155 bpm, SpO2: 99%, BORG: 5/10) 1000 feet is community ambulator  BERG Balance Assessment 32/56 32/56 33/56    34/56 34/56 34/56  35/56 31/56 29/56  33/56 33/56  33/56 32/56 32/56   <36/56 (100% risk for falls), 37-45 (80% risk for falls); 46-51 (>50% risk for falls); 52-55 (lower risk <25% of falls)  FOTO 51 49 53 53 49 50 45 47 45 50  49 51 53 49 Goal is to reach 59     TODAY'S TREATMENT:     SUBJECTIVE: Pt states that she is doing well today. No issues since the last therapy session. No recent falls reported.  All home DME working properly. Remains consistent with HEP. Continues with endurance deficits. No specific questions upon arrival.   PAIN: Denies resting pain    Ther-ex  Forward walking lunges in // bars x 2 lengths; Sit to stand for speed 2 x 60s; Side stepping in // bars with BUE support for speed x 6 lengths; Pball seated marches x 60s; Retro ambulation in // bars x 2 lengths; 6 forward step ups alternating leading LE with BUE support x 3 minutes until pt reaches exhaustion; Squats for speed with BUE support x 2 min 15s;   Not performed: Airex Step Up with Single and BUE support 1 x 20 taps;  Forward Lunge on Wobble pad (pink) in // bars  BUE support, 1 x 8 BLE BOSU (round side up) step-ups alternating leading LE with BUE support x 10 on each side; Airex staggered balance with front foot on 6 step x 60s on each side; Nautilus resisted gait with 20# forward, backward, R lateral, and L lateral x 3 each direction, pt using Lofstrand crutches; Seated adductor squeeze with manual resistance x 10; Seated alternating LAQ with manual resistance x 10 BLE; Forward gait stepping over 1x4 boards in // bars with single UE support x multiple laps; Total Gym (TG) L22 double leg squats 2 x 10; TG L22 single leg squats 2 x 10 BLE; Standing marches with 2# AW x 10 BLE; Standing hip abduction with 2# AW x 10 BLE; Forward high knee marching in // bars with 2# AW x multiple lengths; Airex balance beam side stepping with single UE support x multiple lengths; Romberg stance eyes open with horizontal and vertical head turns x multiple bouts; Seated LAQ and HS  curls with manual resistance from therapist x 10; Forward walking lunges in // bars with 2# dumbbell overhead hold x 2 lengths;     PATIENT EDUCATION: Education details: Plan of care, outcome measures. Pt educated throughout session about proper posture and technique with strengthening exercises. Improved exercise technique, movement at target joints, use of target muscles after min to mod verbal, visual, tactile cues.  Person educated: Patient Education method: Medical Illustrator Education comprehension: verbalized understanding and returned demonstration   HOME EXERCISE PROGRAM: Seated, supine, and core program: Medbridge Access Code: [email or web address]      PT Short Term Goals       PT SHORT TERM GOAL #1   Title Patient will report compliance with HEP for continued strengthening and stability during functional mobility.     Time 6    Period Weeks    Status Achieved  PT Long Term Goals      PT LONG TERM GOAL #1   Title Patient will increase her FOTO score to at least 59 to demonstrate increased function and mobility for higher quality of life.    Baseline 12/11/20: 51; 03/02/21: 49; 06/11/21: 53; 08/20/21: 53; 11/12/21: 49; 12/21/21: 50/ 04/01/22: 45; 06/21/22: 47; 07/27/22: 45; 10/28/22: 50; 03/08/23: 49; 05/17/23: 51 08/25/2023: 53; 11/03/23: 49;   Time 12    Period Weeks    Status Partially Met    Target Date 11/17/2023      PT LONG TERM GOAL #2   Title Patient will maintain Berg Balance score of at least 32/56 in order to demonstrate decreased fall risk during functional activities.    Baseline 12/11/20: 32/56; 03/02/21: 32/56; 06/11/21: 33/56; 08/20/2021: 65/43; 11/12/21: 34/56; 12/22/21: 34/56; 04/01/22: 35/56; 06/21/22: 31/56; 07/27/22: 29/56; 10/28/22: 33/56; 03/08/23: 33/56; 05/17/23: 33/56; 08/25/2023: 32/56; 11/03/23: 32/56;   Time 12    Period Weeks    Status On-going    Target Date 11/17/2023      PT LONG TERM GOAL #3   Title Patient will be able to maintain safe  performance with ascending/descending a flight of stairs using lofstrand crutches and railing in order to allow for safe navigation of her home environment.    Baseline 12/11/20: currently able to use lofstrands and/or railing with supervision by family; 03/02/21: Good maintenance observed; 06/11/21: Good maintenance observed; 08/20/2021: Good maintenance observed; 11/13/21: Good maintenance observed, family reports it continues to be the most challenging activity for patient; 12/22/21: Pt demonstrates good maintenance with stairs. She ascends/descends the stairs at home as often as she can without using her chair stairlift; 04/01/22: Pt demonstrates good maintenance with stairs. She ascends/descends the stairs at home as often as she can without using her chair stairlift, pt also performs stairs in the community; 06/21/22: Pt and family reporting increased difficulty and fatigue when performing stairs at home. Pt reports that she is having more DOE; 07/27/22: Pt and family reporting more difficulty and fatigue when performing stairs at home as well as worsening DOE; 10/28/22: Pt reports she continues have difficulty with steps due to balance and strength deficits; 03/09/23: Pt continues with difficulty with steps; 05/17/23: Unchanged, continued difficulty although performs daily; 08/25/2023: Pt endorses continued difficulty with use BUE support but performs often. 11/03/2023: Pt endorses continued difficulty. She requires BUE support. Performs stairs daily but notices worsening endurance lately.   Time 12    Period Weeks    Status On-going    Target Date 11/17/2023      PT LONG TERM GOAL #4   Title Patient will complete a TUG test in no more than 13.5 seconds for independent mobility and decreased fall risk maintenance    Baseline 12/11/20: 13.4s; 03/02/21: 13.5s; 06/11/21: 11.4s; 08/20/21: 12.35s; 11/12/21: 12.2s; 12/21/21: 12.0s; 04/01/22: 11.3s; 06/21/22: 13.4s; 07/27/22: 13.3s; 10/28/22: 13.4s; 03/08/23: 12.2s; 05/17/23: 12.3s,  08/25/23: 12s; 11/13/23: 11.8s   Time 12    Period Weeks    Status ACHIEVED   Target Date 11/17/2023      PT LONG TERM GOAL #5   Title Patient will increase fastest 10 meter walk test to >0.81 m/s as to improve gait speed for better community ambulation and to reduce fall risk.    Baseline 12/11/20: 14.6s = 0.68 m/s; 03/02/21: 14.3s = 0.70 m/s; 06/11/21: 13.2s = 0.76 m/s; 08/20/21: 13.5s= 0.74 m/s; 11/12/21: 14.4s = 0.69 m/s; 12/21/21: 14.1s = 0.71 m/s; 04/01/22: 13.7s = 0.73 m/s; 06/21/22: 14.1s =  0.71 m/s; 07/27/22: 14.0s =  0.71 m/s; 10/28/22: 14.4s =  0.69 m/s; 03/08/23: 14.3s =  0.70 m/s; 05/17/23: 13.4s = 0.75 m/s, 08/25/2023: 0.71 m/s, 11/03/23: 13.5s = 0.74 m/s   Time 12    Period Weeks    Status Partially Met    Target Date 11/17/2023              Plan     Clinical Impression Statement Pt demonstrates excellent motivation today. Updated outcome measures and goals during last visit. Her TUG, BERG, and 5TSTS all remained consistent compared to the prior update. Her 29m gait speed was relatively consistent with prior readings and once again she ambulated farther during the . In fact she ambulated the farthest she has ever ambulated since we first performed the in August of 2023. Patient's FOTO unfortunately declined to 49 indicating decreased self-perception of function related to her disability. Overall pt showed excellent maintenance of her objective outcome measures which is her primary goal for therapy. She continues reporting more fatigue and DOE with ambulation across campus with her backpack. Today's session focused on endurance activities with cues for maximal speed. Plan to work on additional endurance exercises during session and pt confirms she is using her recumbent bike at home. Discharge attempted at the end of 2021 and pt demonstrated a drop in her function during her prolonged absence out of therapy despite good consistency with her HEP. Frequency was also decreased from every  other week to once per month to find the minimum effective frequency, however, unfortunately this resulted in an unacceptable decline in patient's function as well. Plan is to continue frequency of one therapy session every other week throughout 2025 in order to maintain her function. Pt is regularly exercising at home with the assistance of family as needed/appropriate. She is unable to complete all necessary exercises safely at home either independently or with family assistance due to need from physical therapist to monitor and correct form and technique as well as to provide safe guarding. Provided continued reinforcement regarding the importance of consistency with HEP and therapy sessions will focus on appropriate modifications to her HEP, family education, and maintenance of her function. Therapist will reassess patient's outcome measures at regular intervals to assess need for continued maintenance therapy frequency. Pt requires physical therapy intervention to maintain her functional status including LE strength, balance, and gait speed/quality and she has not achieved maximal benefit from therapy services. She will benefit from continued PT services to address these deficits and maintain her function at home and work.   Personal Factors and Comorbidities Comorbidity 1;Past/Current Experience;Time since onset of injury/illness/exacerbation;Transportation;Social Background    Comorbidities spina bifida    Examination-Activity Limitations Bend;Carry;Continence;Lift;Reach Overhead;Squat;Stairs;Stand;Transfers    Examination-Participation Restrictions Church;Community Activity;Driving;Laundry;Meal Prep;Volunteer;Yard Work    Stability/Clinical Decision Making Stable/Uncomplicated    Rehab Potential Good    Clinical Impairments Affecting Rehab Potential weakness and decreased standing balance    PT Frequency 1 visit every other week   PT Duration 12 weeks    PT Treatment/Interventions ADLs/Self Care  Home Management; ;DME Instruction;Gait training;Stair training;Functional mobility training;Neuromuscular re-education;Balance training;Therapeutic exercise;Therapeutic activities;Patient/family education;Manual techniques;Energy conservation;   PT Next Visit Plan review and modify HEP as appropriate, balance and strength exercises    PT Home Exercise Plan Seated, supine, and core program: [email or web address]    Consulted and Agree with Plan of Care Patient             Selinda JONETTA Eck PT, DPT, [facility] [place], PT 11/15/2023,  10:05 AM

## 2023-11-15 ENCOUNTER — Ambulatory Visit: Payer: Medicaid Other

## 2023-11-15 DIAGNOSIS — R2681 Unsteadiness on feet: Secondary | ICD-10-CM

## 2023-11-15 DIAGNOSIS — M6281 Muscle weakness (generalized): Secondary | ICD-10-CM

## 2023-11-17 ENCOUNTER — Ambulatory Visit: Payer: Medicaid Other

## 2023-11-30 NOTE — Therapy (Signed)
OUTPATIENT PHYSICAL THERAPY TREATMENT   Patient Name: Michelle Randall MRN: [id number] DOB:01-27-1996, 28 y.o., female Today's Date: 12/02/2023  PCP: Carren Rang, PA-C REFERRING PROVIDER: Carren Rang, PA-C   PT End of Session - 12/02/23 1331     Visit Number 73    Number of Visits 112    Date for PT Re-Evaluation 02/07/24    Authorization Type eval: 12/11/20, recert: 03/08/23, 06/02/23    Authorization Time Period 11/17/23-12/28/23 for 3 PT visits   Medicaid 2024  [id number] on auth  AUTH IS REQ  referring:Danielle Montez Morita    Authorization - Visit Number 1    Authorization - Number of Visits 3    PT Start Time 1445    PT Stop Time 1530    PT Time Calculation (min) 45 min    Equipment Utilized During Treatment Gait belt   Bilateral AFOs, lofstrand crutches   Activity Tolerance Patient tolerated treatment well    Behavior During Therapy WFL for tasks assessed/performed            Past Medical History:  Diagnosis Date   Neurogenic bladder    Neurogenic bowel    S/P VP shunt    Spina bifida (HCC)    Past Surgical History:  Procedure Laterality Date   VENTRICULOPERITONEAL SHUNT     Patient Active Problem List   Diagnosis Date Noted   Hydrocephalus with operating shunt (HCC) 03/17/2023   Malfunction of ventriculo-peritoneal shunt (HCC) 07/17/2018   Attention deficit 08/02/2017   Urinary incontinence 05/31/2016   Spina bifida (HCC) 07/29/2014   Neurogenic bladder 05/07/2013   Neurogenic bowel 04/29/2011   REFERRING DIAG: R26.81 (ICD-10-CM) - Unsteadiness on feet  THERAPY DIAG: Muscle weakness (generalized)  Unsteadiness on feet  PERTINENT HISTORY: Patient is a pleasant 28 year old female who presents for continuation of care for generalized weakness/ coordination deficits secondary to diagnosis of spina bifida. PMH includes VP shunt, spina bifida, and neurogenic bowel/bladder. Patient was discharged from this clinic at the end of 2021 due to meeting therapy cap  for insurance that year. Patient reports that she has continued performing her HEP but notes a decline when she stops therapy due to inability to safely perform all of the balance and strength exercises as at home. One of the biggest challenge she is experiencing is stair navigation She reports having multiple near falls when negotiating stairs at home. Patient must ascend/descend stairs to reach bedroom upstairs increasing her risk for falls daily.   PRECAUTIONS: Fall risk  OUTCOME MEASURES: TEST 12/11/20 03/02/21 06/11/21 08/20/21 11/12/21 12/21/21 04/01/22 06/21/22 07/27/22 10/28/22 03/08/23 05/17/23 08/25/2023 11/03/23 Interpretation  5 times sit<>stand 10.6s 9.6s 8.1s Not Tested 7.4s 8.2s 8.3s 8.6s 7.7s 7.7s 7.6s 7.7s 8.2s 7.5s WNL  10 meter walk test 14.6s = 0.68 m/s 14.3s = 0.70 m/s 13.2s =  0.76 m/s 13.5s=0.74 m/s 14.4s = 0.69 m/s 14.1s = 0.71 m/s 13.7s = 0.73 m/s 14.1s = 0.71 m/s 14.0s =  0.71 m/s 14.4s =  0.69 m/s 14.3s =  0.70 m/s 13.4s =  0.75 m/s 14.0s = 0.71 m/s 13.5s = 0.74 m/s <1.0 m/s indicates increased risk for falls; limited community ambulator  Timed up and Go 13.4s 13.5s 11.4s 12.35s 12.2s 12.0s 11.3s 13.4s 13.3s 13.4s 12.2s 12.3s 12.0 11.8s WNL, use of lofstrand crutches, >14 sec indicates increased risk for falls  2 minute walk test Deferred Deferred Deferred Deferred Deferred [employer] Deferred 264' with lofstrand crutches (post: HR: 150, 99%, BORG: 5/10) 233' with lofstrand crutches (post:  HR: 145, 97%, BORG: 5/10) 258' with lofstrand crutches (post: BP: 123/74, HR: 130, 100%, BORG: 6/10) 230' with lofstrand crutches (post:, HR: 141, 100%, BORG: 6/10) 249' with lofstrand crutches (post: HR: 131 bpm, SpO2: 98%, BORG: 5/10) 252' with lofstrand crutches (post: HR: 134 bpm, SpO2: 98%, BORG: 8/10) 267' with lofstrand crutches (post: HR: 155 bpm, SpO2: 99%, BORG: 5/10) 1000 feet is community ambulator  BERG Balance Assessment 32/56 32/56 33/56    34/56 34/56 34/56  35/56 31/56 29/56  33/56 33/56   33/56 32/56 32/56  <36/56 (100% risk for falls), 37-45 (80% risk for falls); 46-51 (>50% risk for falls); 52-55 (lower risk <25% of falls)  FOTO 51 49 53 53 49 50 45 47 45 50  49 51 53 49 Goal is to reach 59     TODAY'S TREATMENT:      SUBJECTIVE: Pt states that she is doing well today. No issues since the last therapy session. No recent falls reported.  All home DME working properly. Remains consistent with HEP. Continues with endurance deficits especially when walking around campus. No specific questions upon arrival.   PAIN: Denies resting pain    Ther-ex  Forward walking lunges in // bars x 2 lengths; Sit to stand for speed from regular height chair without UE support 2 x 60s (25-30 reps per set); Side stepping in // bars with BUE support x 6 lengths; Forward walking in // bars for speed with therapist providing verbal instruction to increase speed and monitoring fatigue x 3 mins; Resisted gait in // bars with UE support forward and backward x 7-9 lengths each;   Neuromuscular Re-education  Dynamic reaching with feet together challenging distance of reaching in multiple directions; Forward gait balancing ball in cone with singe UE support x 4 lengths to work on dual motor tasks; Forward gait stepping over 1"x4" boards in // bars with single UE support x multiple lengths;   Not performed: Airex Step Up with Single and BUE support 1 x 20 taps;  Forward Lunge on Wobble pad (pink) in // bars  BUE support, 1 x 8 BLE BOSU (round side up) step-ups alternating leading LE with BUE support x 10 on each side; Airex staggered balance with front foot on 6" step x 60s on each side; Nautilus resisted gait with 20# forward, backward, R lateral, and L lateral x 3 each direction, pt using Lofstrand crutches; Seated adductor squeeze with manual resistance x 10; Seated alternating LAQ with manual resistance x 10 BLE; Total Gym (TG) L22 double leg squats 2 x 10; TG L22 single leg squats 2 x 10  BLE; Standing marches with 2# AW x 10 BLE; Standing hip abduction with 2# AW x 10 BLE; Forward high knee marching in // bars with 2# AW x multiple lengths; Airex balance beam side stepping with single UE support x multiple lengths; Romberg stance eyes open with horizontal and vertical head turns x multiple bouts; Seated LAQ and HS curls with manual resistance from therapist x 10; Pball seated marches x 60s; Retro ambulation in // bars x 2 lengths; 6" forward step ups alternating leading LE with BUE support x 3 minutes until pt reaches exhaustion; Squats for speed with BUE support x 2 min 15s;   PATIENT EDUCATION: Education details: Pt educated throughout session about proper posture and technique with strengthening exercises. Improved exercise technique, movement at target joints, use of target muscles after min to mod verbal, visual, tactile cues.  Person educated: Patient Education method: Medical illustrator Education comprehension:  verbalized understanding and returned demonstration   HOME EXERCISE PROGRAM: Seated, supine, and core program: Medbridge Access Code: [email or web address]      PT Short Term Goals       PT SHORT TERM GOAL #1   Title Patient will report compliance with HEP for continued strengthening and stability during functional mobility.     Time 6    Period Weeks    Status Achieved              PT Long Term Goals      PT LONG TERM GOAL #1   Title Patient will increase her FOTO score to at least 59 to demonstrate increased function and mobility for higher quality of life.    Baseline 12/11/20: 51; 03/02/21: 49; 06/11/21: 53; 08/20/21: 53; 11/12/21: 49; 12/21/21: 50/ 04/01/22: 45; 06/21/22: 47; 07/27/22: 45; 10/28/22: 50; 03/08/23: 49; 05/17/23: 51 08/25/2023: 53; 11/03/23: 49;   Time 12    Period Weeks    Status Partially Met    Target Date 11/17/2023      PT LONG TERM GOAL #2   Title Patient will maintain Berg Balance score of at least 32/56 in order to  demonstrate decreased fall risk during functional activities.    Baseline 12/11/20: 32/56; 03/02/21: 32/56; 06/11/21: 33/56; 08/20/2021: 96/04; 11/12/21: 34/56; 12/22/21: 34/56; 04/01/22: 35/56; 06/21/22: 31/56; 07/27/22: 29/56; 10/28/22: 33/56; 03/08/23: 33/56; 05/17/23: 33/56; 08/25/2023: 32/56; 11/03/23: 32/56;   Time 12    Period Weeks    Status On-going    Target Date 11/17/2023      PT LONG TERM GOAL #3   Title Patient will be able to maintain safe performance with ascending/descending a flight of stairs using lofstrand crutches and railing in order to allow for safe navigation of her home environment.    Baseline 12/11/20: currently able to use lofstrands and/or railing with supervision by family; 03/02/21: Good maintenance observed; 06/11/21: Good maintenance observed; 08/20/2021: Good maintenance observed; 11/13/21: Good maintenance observed, family reports it continues to be the most challenging activity for patient; 12/22/21: Pt demonstrates good maintenance with stairs. She ascends/descends the stairs at home as often as she can without using her chair stairlift; 04/01/22: Pt demonstrates good maintenance with stairs. She ascends/descends the stairs at home as often as she can without using her chair stairlift, pt also performs stairs in the community; 06/21/22: Pt and family reporting increased difficulty and fatigue when performing stairs at home. Pt reports that she is having more DOE; 07/27/22: Pt and family reporting more difficulty and fatigue when performing stairs at home as well as worsening DOE; 10/28/22: Pt reports she continues have difficulty with steps due to balance and strength deficits; 03/09/23: Pt continues with difficulty with steps; 05/17/23: Unchanged, continued difficulty although performs daily; 08/25/2023: Pt endorses continued difficulty with use BUE support but performs often. 11/03/2023: Pt endorses continued difficulty. She requires BUE support. Performs stairs daily but notices worsening  endurance lately.   Time 12    Period Weeks    Status On-going    Target Date 11/17/2023      PT LONG TERM GOAL #4   Title Patient will complete a TUG test in no more than 13.5 seconds for independent mobility and decreased fall risk maintenance    Baseline 12/11/20: 13.4s; 03/02/21: 13.5s; 06/11/21: 11.4s; 08/20/21: 12.35s; 11/12/21: 12.2s; 12/21/21: 12.0s; 04/01/22: 11.3s; 06/21/22: 13.4s; 07/27/22: 13.3s; 10/28/22: 13.4s; 03/08/23: 12.2s; 05/17/23: 12.3s, 08/25/23: 12s; 11/13/23: 11.8s   Time 12    Period Weeks    Status  ACHIEVED   Target Date 11/17/2023      PT LONG TERM GOAL #5   Title Patient will increase fastest 10 meter walk test to >0.81 m/s as to improve gait speed for better community ambulation and to reduce fall risk.    Baseline 12/11/20: 14.6s = 0.68 m/s; 03/02/21: 14.3s = 0.70 m/s; 06/11/21: 13.2s = 0.76 m/s; 08/20/21: 13.5s= 0.74 m/s; 11/12/21: 14.4s = 0.69 m/s; 12/21/21: 14.1s = 0.71 m/s; 04/01/22: 13.7s = 0.73 m/s; 06/21/22: 14.1s = 0.71 m/s; 07/27/22: 14.0s =  0.71 m/s; 10/28/22: 14.4s =  0.69 m/s; 03/08/23: 14.3s =  0.70 m/s; 05/17/23: 13.4s = 0.75 m/s, 08/25/2023: 0.71 m/s, 11/03/23: 13.5s = 0.74 m/s   Time 12    Period Weeks    Status Partially Met    Target Date 11/17/2023              Plan     Clinical Impression Statement Pt demonstrates excellent motivation today. Continued focus on endurance activities with cues for maximal speed to challenge cardiopulmonary function. Also challenged balance with dynamic reaching and dual motor tasking. Plan to work on additional endurance exercises during session and pt confirms she is using her recumbent bike at home. Discharge attempted at the end of 2021 and pt demonstrated a drop in her function during her prolonged absence out of therapy despite good consistency with her HEP. Frequency was also decreased from every other week to once per month to find the minimum effective frequency, however, unfortunately this resulted in an unacceptable decline in  patient's function as well. Plan is to continue frequency of one therapy session every other week throughout 2025 in order to maintain her function. Pt is regularly exercising at home with the assistance of family as needed/appropriate. She is unable to complete all necessary exercises safely at home either independently or with family assistance due to need from physical therapist to monitor and correct form and technique as well as to provide safe guarding. Provided continued reinforcement regarding the importance of consistency with HEP and therapy sessions will focus on appropriate modifications to her HEP, family education, and maintenance of her function. Therapist will reassess patient's outcome measures at regular intervals to assess need for continued maintenance therapy frequency. Pt requires physical therapy intervention to maintain her functional status including LE strength, balance, and gait speed/quality and she has not achieved maximal benefit from therapy services. She will benefit from continued PT services to address these deficits and maintain her function at home and work.   Personal Factors and Comorbidities Comorbidity 1;Past/Current Experience;Time since onset of injury/illness/exacerbation;Transportation;Social Background    Comorbidities spina bifida    Examination-Activity Limitations Bend;Carry;Continence;Lift;Reach Overhead;Squat;Stairs;Stand;Transfers    Examination-Participation Restrictions Church;Community Activity;Driving;Laundry;Meal Prep;Volunteer;Yard Work    Stability/Clinical Decision Making Stable/Uncomplicated    Rehab Potential Good    Clinical Impairments Affecting Rehab Potential weakness and decreased standing balance    PT Frequency 1 visit every other week   PT Duration 12 weeks    PT Treatment/Interventions ADLs/Self Care Home Management; ;DME Instruction;Gait training;Stair training;Functional mobility training;Neuromuscular re-education;Balance  training;Therapeutic exercise;Therapeutic activities;Patient/family education;Manual techniques;Energy conservation;   PT Next Visit Plan review and modify HEP as appropriate, balance and strength exercises    PT Home Exercise Plan Seated, supine, and core program: [email or web address]    Consulted and Agree with Plan of Care Patient             Lynnea Maizes PT, DPT, [facility] Ngozi Alvidrez, PT 12/02/2023, 1:43 PM

## 2023-12-01 ENCOUNTER — Ambulatory Visit: Payer: Medicaid Other | Attending: Student

## 2023-12-01 DIAGNOSIS — R262 Difficulty in walking, not elsewhere classified: Secondary | ICD-10-CM | POA: Diagnosis present

## 2023-12-01 DIAGNOSIS — M6281 Muscle weakness (generalized): Secondary | ICD-10-CM | POA: Insufficient documentation

## 2023-12-01 DIAGNOSIS — R2681 Unsteadiness on feet: Secondary | ICD-10-CM | POA: Diagnosis present

## 2023-12-15 ENCOUNTER — Ambulatory Visit: Payer: Medicaid Other

## 2023-12-15 DIAGNOSIS — R2681 Unsteadiness on feet: Secondary | ICD-10-CM

## 2023-12-15 DIAGNOSIS — R262 Difficulty in walking, not elsewhere classified: Secondary | ICD-10-CM

## 2023-12-15 DIAGNOSIS — M6281 Muscle weakness (generalized): Secondary | ICD-10-CM

## 2023-12-15 NOTE — Therapy (Signed)
OUTPATIENT PHYSICAL THERAPY TREATMENT   Patient Name: Michelle Randall MRN: [id number] DOB:1996/10/05, 28 y.o., female Today's Date: 12/15/2023  PCP: Carren Rang, PA-C REFERRING PROVIDER: Carren Rang, PA-C   PT End of Session - 12/15/23 1440     Visit Number 74    Number of Visits 112    Date for PT Re-Evaluation 02/07/24    Authorization Type eval: 12/11/20, recert: 03/08/23, 06/02/23    Authorization Time Period 11/17/23-12/28/23 for 3 PT visits   Medicaid 2024  [id number] on auth  AUTH IS REQ  referring:Danielle Montez Morita    Authorization - Visit Number 2    Authorization - Number of Visits 3    PT Start Time 1445    PT Stop Time 1530    PT Time Calculation (min) 45 min    Equipment Utilized During Treatment Gait belt   Bilateral AFOs, lofstrand crutches   Activity Tolerance Patient tolerated treatment well    Behavior During Therapy WFL for tasks assessed/performed            Past Medical History:  Diagnosis Date   Neurogenic bladder    Neurogenic bowel    S/P VP shunt    Spina bifida (HCC)    Past Surgical History:  Procedure Laterality Date   VENTRICULOPERITONEAL SHUNT     Patient Active Problem List   Diagnosis Date Noted   Hydrocephalus with operating shunt (HCC) 03/17/2023   Malfunction of ventriculo-peritoneal shunt (HCC) 07/17/2018   Attention deficit 08/02/2017   Urinary incontinence 05/31/2016   Spina bifida (HCC) 07/29/2014   Neurogenic bladder 05/07/2013   Neurogenic bowel 04/29/2011   REFERRING DIAG: R26.81 (ICD-10-CM) - Unsteadiness on feet  THERAPY DIAG: Muscle weakness (generalized)  Unsteadiness on feet  Difficulty in walking, not elsewhere classified  PERTINENT HISTORY: Patient is a pleasant 28 year old female who presents for continuation of care for generalized weakness/ coordination deficits secondary to diagnosis of spina bifida. PMH includes VP shunt, spina bifida, and neurogenic bowel/bladder. Patient was discharged from this  clinic at the end of 2021 due to meeting therapy cap for insurance that year. Patient reports that she has continued performing her HEP but notes a decline when she stops therapy due to inability to safely perform all of the balance and strength exercises as at home. One of the biggest challenge she is experiencing is stair navigation She reports having multiple near falls when negotiating stairs at home. Patient must ascend/descend stairs to reach bedroom upstairs increasing her risk for falls daily.   PRECAUTIONS: Fall risk  OUTCOME MEASURES: TEST 12/11/20 03/02/21 06/11/21 08/20/21 11/12/21 12/21/21 04/01/22 06/21/22 07/27/22 10/28/22 03/08/23 05/17/23 08/25/2023 11/03/23 Interpretation  5 times sit<>stand 10.6s 9.6s 8.1s Not Tested 7.4s 8.2s 8.3s 8.6s 7.7s 7.7s 7.6s 7.7s 8.2s 7.5s WNL  10 meter walk test 14.6s = 0.68 m/s 14.3s = 0.70 m/s 13.2s =  0.76 m/s 13.5s=0.74 m/s 14.4s = 0.69 m/s 14.1s = 0.71 m/s 13.7s = 0.73 m/s 14.1s = 0.71 m/s 14.0s =  0.71 m/s 14.4s =  0.69 m/s 14.3s =  0.70 m/s 13.4s =  0.75 m/s 14.0s = 0.71 m/s 13.5s = 0.74 m/s <1.0 m/s indicates increased risk for falls; limited community ambulator  Timed up and Go 13.4s 13.5s 11.4s 12.35s 12.2s 12.0s 11.3s 13.4s 13.3s 13.4s 12.2s 12.3s 12.0 11.8s WNL, use of lofstrand crutches, >14 sec indicates increased risk for falls  2 minute walk test Deferred Deferred Deferred Deferred Deferred [employer] Deferred 264' with lofstrand crutches (post: HR: 150, 99%,  BORG: 5/10) 233' with lofstrand crutches (post: HR: 145, 97%, BORG: 5/10) 258' with lofstrand crutches (post: BP: 123/74, HR: 130, 100%, BORG: 6/10) 230' with lofstrand crutches (post:, HR: 141, 100%, BORG: 6/10) 249' with lofstrand crutches (post: HR: 131 bpm, SpO2: 98%, BORG: 5/10) 252' with lofstrand crutches (post: HR: 134 bpm, SpO2: 98%, BORG: 8/10) 267' with lofstrand crutches (post: HR: 155 bpm, SpO2: 99%, BORG: 5/10) 1000 feet is community ambulator  BERG Balance Assessment 32/56 32/56 33/56     34/56 34/56 34/56  35/56 31/56 29/56  33/56 33/56  33/56 32/56 32/56  <36/56 (100% risk for falls), 37-45 (80% risk for falls); 46-51 (>50% risk for falls); 52-55 (lower risk <25% of falls)  FOTO 51 49 53 53 49 50 45 47 45 50  49 51 53 49 Goal is to reach 59     TODAY'S TREATMENT:     SUBJECTIVE: Pt states that she is doing well today. No issues since the last therapy session. No recent falls reported.  All home DME working properly. Remains consistent with HEP. Continues with endurance deficits especially when walking around campus. Pt notes that it's been easier taking her bookbag on/off. No specific questions upon arrival.   PAIN: Denies resting pain    Ther-ex  Total Gym (TG) L22 double leg squats 2 x 10; TG L22 single leg squats 2 x 10 BLE; TG L22 double leg jump x 15; Sit to stand for speed from regular height chair without UE support 2 x 60s (25-30 reps per set); Nautilus resisted gait with 20# forward, backward, R lateral, and L lateral x 2 each direction, pt using Lofstrand crutches; Forward walking in // bars for speed with therapist providing verbal instruction to increase speed and monitoring fatigue x 3 mins;   Neuromuscular Re-education  Dynamic reaching with feet together challenging distance of reaching in multiple directions; Forward gait balancing ball in cone with singe UE support x 4 lengths to work on dual motor tasks; Forward gait stepping over 1"x4" boards in // bars with single UE support x multiple lengths;   Not performed: Airex Step Up with Single and BUE support 1 x 20 taps;  Forward Lunge on Wobble pad (pink) in // bars  BUE support, 1 x 8 BLE BOSU (round side up) step-ups alternating leading LE with BUE support x 10 on each side; Airex staggered balance with front foot on 6" step x 60s on each side; Forward walking lunges in // bars x 2 lengths; Side stepping in // bars with BUE support x 6 lengths; Resisted gait in // bars with UE support forward and  backward x 7-9 lengths each; Seated adductor squeeze with manual resistance x 10; Seated alternating LAQ with manual resistance x 10 BLE; Standing marches with 2# AW x 10 BLE; Standing hip abduction with 2# AW x 10 BLE; Forward high knee marching in // bars with 2# AW x multiple lengths; Airex balance beam side stepping with single UE support x multiple lengths; Romberg stance eyes open with horizontal and vertical head turns x multiple bouts; Seated LAQ and HS curls with manual resistance from therapist x 10; Pball seated marches x 60s; Retro ambulation in // bars x 2 lengths; 6" forward step ups alternating leading LE with BUE support x 3 minutes until pt reaches exhaustion; Squats for speed with BUE support x 2 min 15s;   PATIENT EDUCATION: Education details: Pt educated throughout session about proper posture and technique with strengthening exercises. Improved exercise technique, movement at target joints,  use of target muscles after min to mod verbal, visual, tactile cues.  Person educated: Patient Education method: Medical illustrator Education comprehension: verbalized understanding and returned demonstration   HOME EXERCISE PROGRAM: Seated, supine, and core program: Medbridge Access Code: [email or web address]      PT Short Term Goals       PT SHORT TERM GOAL #1   Title Patient will report compliance with HEP for continued strengthening and stability during functional mobility.     Time 6    Period Weeks    Status Achieved              PT Long Term Goals      PT LONG TERM GOAL #1   Title Patient will increase her FOTO score to at least 59 to demonstrate increased function and mobility for higher quality of life.    Baseline 12/11/20: 51; 03/02/21: 49; 06/11/21: 53; 08/20/21: 53; 11/12/21: 49; 12/21/21: 50/ 04/01/22: 45; 06/21/22: 47; 07/27/22: 45; 10/28/22: 50; 03/08/23: 49; 05/17/23: 51 08/25/2023: 53; 11/03/23: 49;   Time 12    Period Weeks    Status Partially Met     Target Date 11/17/2023      PT LONG TERM GOAL #2   Title Patient will maintain Berg Balance score of at least 32/56 in order to demonstrate decreased fall risk during functional activities.    Baseline 12/11/20: 32/56; 03/02/21: 32/56; 06/11/21: 33/56; 08/20/2021: 57/84; 11/12/21: 34/56; 12/22/21: 34/56; 04/01/22: 35/56; 06/21/22: 31/56; 07/27/22: 29/56; 10/28/22: 33/56; 03/08/23: 33/56; 05/17/23: 33/56; 08/25/2023: 32/56; 11/03/23: 32/56;   Time 12    Period Weeks    Status On-going    Target Date 11/17/2023      PT LONG TERM GOAL #3   Title Patient will be able to maintain safe performance with ascending/descending a flight of stairs using lofstrand crutches and railing in order to allow for safe navigation of her home environment.    Baseline 12/11/20: currently able to use lofstrands and/or railing with supervision by family; 03/02/21: Good maintenance observed; 06/11/21: Good maintenance observed; 08/20/2021: Good maintenance observed; 11/13/21: Good maintenance observed, family reports it continues to be the most challenging activity for patient; 12/22/21: Pt demonstrates good maintenance with stairs. She ascends/descends the stairs at home as often as she can without using her chair stairlift; 04/01/22: Pt demonstrates good maintenance with stairs. She ascends/descends the stairs at home as often as she can without using her chair stairlift, pt also performs stairs in the community; 06/21/22: Pt and family reporting increased difficulty and fatigue when performing stairs at home. Pt reports that she is having more DOE; 07/27/22: Pt and family reporting more difficulty and fatigue when performing stairs at home as well as worsening DOE; 10/28/22: Pt reports she continues have difficulty with steps due to balance and strength deficits; 03/09/23: Pt continues with difficulty with steps; 05/17/23: Unchanged, continued difficulty although performs daily; 08/25/2023: Pt endorses continued difficulty with use BUE support but performs  often. 11/03/2023: Pt endorses continued difficulty. She requires BUE support. Performs stairs daily but notices worsening endurance lately.   Time 12    Period Weeks    Status On-going    Target Date 11/17/2023      PT LONG TERM GOAL #4   Title Patient will complete a TUG test in no more than 13.5 seconds for independent mobility and decreased fall risk maintenance    Baseline 12/11/20: 13.4s; 03/02/21: 13.5s; 06/11/21: 11.4s; 08/20/21: 12.35s; 11/12/21: 12.2s; 12/21/21: 12.0s; 04/01/22: 11.3s; 06/21/22: 13.4s; 07/27/22: 13.3s;  10/28/22: 13.4s; 03/08/23: 12.2s; 05/17/23: 12.3s, 08/25/23: 12s; 11/13/23: 11.8s   Time 12    Period Weeks    Status ACHIEVED   Target Date 11/17/2023      PT LONG TERM GOAL #5   Title Patient will increase fastest 10 meter walk test to >0.81 m/s as to improve gait speed for better community ambulation and to reduce fall risk.    Baseline 12/11/20: 14.6s = 0.68 m/s; 03/02/21: 14.3s = 0.70 m/s; 06/11/21: 13.2s = 0.76 m/s; 08/20/21: 13.5s= 0.74 m/s; 11/12/21: 14.4s = 0.69 m/s; 12/21/21: 14.1s = 0.71 m/s; 04/01/22: 13.7s = 0.73 m/s; 06/21/22: 14.1s = 0.71 m/s; 07/27/22: 14.0s =  0.71 m/s; 10/28/22: 14.4s =  0.69 m/s; 03/08/23: 14.3s =  0.70 m/s; 05/17/23: 13.4s = 0.75 m/s, 08/25/2023: 0.71 m/s, 11/03/23: 13.5s = 0.74 m/s   Time 12    Period Weeks    Status Partially Met    Target Date 11/17/2023              Plan     Clinical Impression Statement Pt demonstrates excellent motivation today. She continues to use Lofstrand crutches or the bars for ambulation. Continued focus on endurance activities with cues for maximal speed to challenge cardiopulmonary function. Also challenged balance with dynamic reaching and dual motor tasking. Reintroduced resisted gait with the Nautilus Freedom Trainer. Plan to work on additional endurance exercises during session and pt confirms she is using her recumbent bike at home. Discharge attempted at the end of 2021 and pt demonstrated a drop in her function during  her prolonged absence out of therapy despite good consistency with her HEP. Frequency was also decreased from every other week to once per month to find the minimum effective frequency, however, unfortunately this resulted in an unacceptable decline in patient's function as well. Plan is to continue frequency of one therapy session every other week throughout 2025 in order to maintain her function. Pt is regularly exercising at home with the assistance of family as needed/appropriate. She is unable to complete all necessary exercises safely at home either independently or with family assistance due to need from physical therapist to monitor and correct form and technique as well as to provide safe guarding. Provided continued reinforcement regarding the importance of consistency with HEP and therapy sessions will focus on appropriate modifications to her HEP, family education, and maintenance of her function. Therapist will reassess patient's outcome measures at regular intervals to assess need for continued maintenance therapy frequency. Pt requires physical therapy intervention to maintain her functional status including LE strength, balance, and gait speed/quality and she has not achieved maximal benefit from therapy services. She will benefit from continued PT services to address these deficits and maintain her function at home and work.   Personal Factors and Comorbidities Comorbidity 1;Past/Current Experience;Time since onset of injury/illness/exacerbation;Transportation;Social Background    Comorbidities spina bifida    Examination-Activity Limitations Bend;Carry;Continence;Lift;Reach Overhead;Squat;Stairs;Stand;Transfers    Examination-Participation Restrictions Church;Community Activity;Driving;Laundry;Meal Prep;Volunteer;Yard Work    Stability/Clinical Decision Making Stable/Uncomplicated    Rehab Potential Good    Clinical Impairments Affecting Rehab Potential weakness and decreased standing  balance    PT Frequency 1 visit every other week   PT Duration 12 weeks    PT Treatment/Interventions ADLs/Self Care Home Management; ;DME Instruction;Gait training;Stair training;Functional mobility training;Neuromuscular re-education;Balance training;Therapeutic exercise;Therapeutic activities;Patient/family education;Manual techniques;Energy conservation;   PT Next Visit Plan review and modify HEP as appropriate, balance and strength exercises    PT Home Exercise Plan Seated, supine, and core  program: [id number]    Consulted and Agree with Plan of Care Patient            Sherri Sear, SPT [facility] [address] D Huprich PT, DPT, GCS Huprich,Jason, PT 12/15/2023, 5:09 PM

## 2023-12-29 ENCOUNTER — Ambulatory Visit: Payer: Medicaid Other

## 2024-01-10 ENCOUNTER — Ambulatory Visit: Payer: Medicaid Other | Attending: Student

## 2024-01-10 DIAGNOSIS — R262 Difficulty in walking, not elsewhere classified: Secondary | ICD-10-CM | POA: Diagnosis present

## 2024-01-10 DIAGNOSIS — R279 Unspecified lack of coordination: Secondary | ICD-10-CM | POA: Insufficient documentation

## 2024-01-10 DIAGNOSIS — M6281 Muscle weakness (generalized): Secondary | ICD-10-CM | POA: Diagnosis present

## 2024-01-10 DIAGNOSIS — R2681 Unsteadiness on feet: Secondary | ICD-10-CM

## 2024-01-10 NOTE — Therapy (Unsigned)
 OUTPATIENT PHYSICAL THERAPY TREATMENT/DISCHARGE   Patient Name: Michelle Randall MRN: [id number] DOB:Nov 09, 1996, 28 y.o., female Today's Date: 01/12/2024  PCP: Carren Rang, PA-C REFERRING PROVIDER: Carren Rang, PA-C   PT End of Session - 01/12/24 1035     Visit Number 75    Number of Visits 112    Date for PT Re-Evaluation 02/07/24    Authorization Type eval: 12/11/20, recert: 03/08/23, 06/02/23    Authorization Time Period 11/17/23-12/28/23 for 3 PT visits   Medicaid 2024  [id number] on auth  AUTH IS REQ  referring:Danielle Montez Morita    Authorization - Visit Number 3    Authorization - Number of Visits 3    PT Start Time 1445    PT Stop Time 1530    PT Time Calculation (min) 45 min    Equipment Utilized During Treatment Gait belt   Bilateral AFOs, lofstrand crutches   Activity Tolerance Patient tolerated treatment well    Behavior During Therapy WFL for tasks assessed/performed            Past Medical History:  Diagnosis Date   Neurogenic bladder    Neurogenic bowel    S/P VP shunt    Spina bifida (HCC)    Past Surgical History:  Procedure Laterality Date   VENTRICULOPERITONEAL SHUNT     Patient Active Problem List   Diagnosis Date Noted   Hydrocephalus with operating shunt (HCC) 03/17/2023   Malfunction of ventriculo-peritoneal shunt (HCC) 07/17/2018   Attention deficit 08/02/2017   Urinary incontinence 05/31/2016   Spina bifida (HCC) 07/29/2014   Neurogenic bladder 05/07/2013   Neurogenic bowel 04/29/2011   REFERRING DIAG: R26.81 (ICD-10-CM) - Unsteadiness on feet  THERAPY DIAG: Difficulty in walking, not elsewhere classified  Muscle weakness (generalized)  Lack of coordination  PERTINENT HISTORY: Patient is a pleasant 28 year old female who presents for continuation of care for generalized weakness/ coordination deficits secondary to diagnosis of spina bifida. PMH includes VP shunt, spina bifida, and neurogenic bowel/bladder. Patient was discharged from  this clinic at the end of 2021 due to meeting therapy cap for insurance that year. Patient reports that she has continued performing her HEP but notes a decline when she stops therapy due to inability to safely perform all of the balance and strength exercises as at home. One of the biggest challenge she is experiencing is stair navigation She reports having multiple near falls when negotiating stairs at home. Patient must ascend/descend stairs to reach bedroom upstairs increasing her risk for falls daily.   PRECAUTIONS: Fall risk  OUTCOME MEASURES: TEST 12/11/20 03/02/21 06/11/21 08/20/21 11/12/21 12/21/21 04/01/22 06/21/22 07/27/22 10/28/22 03/08/23 05/17/23 08/25/2023 11/03/23 Interpretation  5 times sit<>stand 10.6s 9.6s 8.1s Not Tested 7.4s 8.2s 8.3s 8.6s 7.7s 7.7s 7.6s 7.7s 8.2s 7.5s WNL  10 meter walk test 14.6s = 0.68 m/s 14.3s = 0.70 m/s 13.2s =  0.76 m/s 13.5s=0.74 m/s 14.4s = 0.69 m/s 14.1s = 0.71 m/s 13.7s = 0.73 m/s 14.1s = 0.71 m/s 14.0s =  0.71 m/s 14.4s =  0.69 m/s 14.3s =  0.70 m/s 13.4s =  0.75 m/s 14.0s = 0.71 m/s 13.5s = 0.74 m/s <1.0 m/s indicates increased risk for falls; limited community ambulator  Timed up and Go 13.4s 13.5s 11.4s 12.35s 12.2s 12.0s 11.3s 13.4s 13.3s 13.4s 12.2s 12.3s 12.0 11.8s WNL, use of lofstrand crutches, >14 sec indicates increased risk for falls  2 minute walk test [employer] Def[employer] 264' with lofstrand crutches (post: HR: 150, 99%,  BORG: 5/10) 233' with lofstrand crutches (post: HR: 145, 97%, BORG: 5/10) 258' with lofstrand crutches (post: BP: 123/74, HR: 130, 100%, BORG: 6/10) 230' with lofstrand crutches (post:, HR: 141, 100%, BORG: 6/10) 249' with lofstrand crutches (post: HR: 131 bpm, SpO2: 98%, BORG: 5/10) 252' with lofstrand crutches (post: HR: 134 bpm, SpO2: 98%, BORG: 8/10) 267' with lofstrand crutches (post: HR: 155 bpm, SpO2: 99%, BORG: 5/10) 1000 feet is community ambulator  BERG Balance Assessment 32/56 32/56  33/56   34/56 34/56 34/56  35/56 31/56 29/56  33/56 33/56  33/56 32/56 32/56  <36/56 (100% risk for falls), 37-45 (80% risk for falls); 46-51 (>50% risk for falls); 52-55 (lower risk <25% of falls)  FOTO 51 49 53 53 49 50 45 47 45 50  49 51 53 49 Goal is to reach 59     TODAY'S TREATMENT:     SUBJECTIVE: Pt states that she is doing well today.  All home DME working properly. Pt reports that she fell 2 weeks ago while walking outside in the rain, falling forward onto her chest. She notes bruising, soreness, and shoulder discomfort while carrying her book bag afterwards. Pt continues to feel hesitant with walking in the rain or crowded areas without support and has an increased fear of falling. She remains consistent with her HEP, however balance exercises prove to be difficult at home without external support from someone to assist in preventing falls. Her family is physically unable to provide this support. She continues with endurance deficits especially when walking around campus at work She would like to continue PT if possible to prevent any decline in function in ADLs and ambulation. No specific questions upon arrival.   PAIN: Denies resting pain    Ther-ex  Sit to stand for speed from regular height chair without UE support 2 x 60s (25-30 reps per set);  Reviewed/Performed HEP in preparation for discharge: Seated march x 10 BLE; Seated hip adduction isometrics with ball x 10 with 3s hold; Seated isometric hip abduction x 10 with 3s hold; Seated LAQ x 10 BLE; Seated Shoulder circles x 30s each direction; Seated scapular retraction x 10; Seated hamstring stretch 2 x 30s BLE; Seated piriformis stretch 2 x 30s BLE; Seated thoracic lumbar extension with pectoralis stretch 2 x 20s; Seated flexion stretch with swiss ball x 15 with 3s hold; Seated thoracic flexion and rotation with swiss ball x 10 with 3s hold each direction; Seated reaching across body x 10 BUE; Supine bridge with therapist  support for feet x 15; Abdominal press into ball x 15 with 3s hold; Dead bug x 10 BLE; Quadruped transversus abdominis bracing 2 x 30s hold; Quadruped shoulder taps x 10 BLE; Quadruped hip extension kicks on elbows x 10 BLE; Modified push up on knees x 15;   PATIENT EDUCATION: Education details: Pt educated throughout session about proper posture and technique with strengthening exercises. Improved exercise technique, movement at target joints, use of target muscles after min to mod verbal, visual, tactile cues. HEP. Discharge Person educated: Patient Education method: Explanation and Demonstration Education comprehension: verbalized understanding and returned demonstration   HOME EXERCISE PROGRAM: Seated, supine, and core program: Medbridge Access Code: [email or web address]      PT Short Term Goals       PT SHORT TERM GOAL #1   Title Patient will report compliance with HEP for continued strengthening and stability during functional mobility.     Time 6    Period Weeks    Status Achieved  PT Long Term Goals      PT LONG TERM GOAL #1   Title Patient will increase her FOTO score to at least 59 to demonstrate increased function and mobility for higher quality of life.    Baseline 12/11/20: 51; 03/02/21: 49; 06/11/21: 53; 08/20/21: 53; 11/12/21: 49; 12/21/21: 50/ 04/01/22: 45; 06/21/22: 47; 07/27/22: 45; 10/28/22: 50; 03/08/23: 49; 05/17/23: 51 08/25/2023: 53; 11/03/23: 49;   Time 12    Period Weeks    Status Partially Met    Target Date 11/17/2023      PT LONG TERM GOAL #2   Title Patient will maintain Berg Balance score of at least 32/56 in order to demonstrate decreased fall risk during functional activities.    Baseline 12/11/20: 32/56; 03/02/21: 32/56; 06/11/21: 33/56; 08/20/2021: 91/47; 11/12/21: 34/56; 12/22/21: 34/56; 04/01/22: 35/56; 06/21/22: 31/56; 07/27/22: 29/56; 10/28/22: 33/56; 03/08/23: 33/56; 05/17/23: 33/56; 08/25/2023: 32/56; 11/03/23: 32/56;   Time 12    Period Weeks     Status On-going    Target Date 11/17/2023      PT LONG TERM GOAL #3   Title Patient will be able to maintain safe performance with ascending/descending a flight of stairs using lofstrand crutches and railing in order to allow for safe navigation of her home environment.    Baseline 12/11/20: currently able to use lofstrands and/or railing with supervision by family; 03/02/21: Good maintenance observed; 06/11/21: Good maintenance observed; 08/20/2021: Good maintenance observed; 11/13/21: Good maintenance observed, family reports it continues to be the most challenging activity for patient; 12/22/21: Pt demonstrates good maintenance with stairs. She ascends/descends the stairs at home as often as she can without using her chair stairlift; 04/01/22: Pt demonstrates good maintenance with stairs. She ascends/descends the stairs at home as often as she can without using her chair stairlift, pt also performs stairs in the community; 06/21/22: Pt and family reporting increased difficulty and fatigue when performing stairs at home. Pt reports that she is having more DOE; 07/27/22: Pt and family reporting more difficulty and fatigue when performing stairs at home as well as worsening DOE; 10/28/22: Pt reports she continues have difficulty with steps due to balance and strength deficits; 03/09/23: Pt continues with difficulty with steps; 05/17/23: Unchanged, continued difficulty although performs daily; 08/25/2023: Pt endorses continued difficulty with use BUE support but performs often. 11/03/2023: Pt endorses continued difficulty. She requires BUE support. Performs stairs daily but notices worsening endurance lately.   Time 12    Period Weeks    Status On-going    Target Date 11/17/2023      PT LONG TERM GOAL #4   Title Patient will complete a TUG test in no more than 13.5 seconds for independent mobility and decreased fall risk maintenance    Baseline 12/11/20: 13.4s; 03/02/21: 13.5s; 06/11/21: 11.4s; 08/20/21: 12.35s; 11/12/21:  12.2s; 12/21/21: 12.0s; 04/01/22: 11.3s; 06/21/22: 13.4s; 07/27/22: 13.3s; 10/28/22: 13.4s; 03/08/23: 12.2s; 05/17/23: 12.3s, 08/25/23: 12s; 11/13/23: 11.8s   Time 12    Period Weeks    Status ACHIEVED   Target Date 11/17/2023      PT LONG TERM GOAL #5   Title Patient will increase fastest 10 meter walk test to >0.81 m/s as to improve gait speed for better community ambulation and to reduce fall risk.    Baseline 12/11/20: 14.6s = 0.68 m/s; 03/02/21: 14.3s = 0.70 m/s; 06/11/21: 13.2s = 0.76 m/s; 08/20/21: 13.5s= 0.74 m/s; 11/12/21: 14.4s = 0.69 m/s; 12/21/21: 14.1s = 0.71 m/s; 04/01/22: 13.7s = 0.73 m/s; 06/21/22: 14.1s =  0.71 m/s; 07/27/22: 14.0s =  0.71 m/s; 10/28/22: 14.4s =  0.69 m/s; 03/08/23: 14.3s =  0.70 m/s; 05/17/23: 13.4s = 0.75 m/s, 08/25/2023: 0.71 m/s, 11/03/23: 13.5s = 0.74 m/s   Time 12    Period Weeks    Status Partially Met    Target Date 11/17/2023              Plan     Clinical Impression Statement Pt demonstrates excellent motivation today. She continues to use Lofstrand crutches or the bars for ambulation and would be unable to ambulate them. Reviewed and performed her HEP as we prepare for discharge today due to Medicaid denying coverage for additional visits. Unfortunately she has had an increase in her fear of falling since her recent fall since the last therapy session. She really needs to continue therapy but due to Medicaid denial is unable. Discharge attempted at the end of 2021 and pt demonstrated a drop in her function during her prolonged absence out of therapy despite good consistency with her HEP. Pt expressed concern with potential decline based on past experiences. She feels comfortable with her HEP, however due to no external support, it limits her ability to progress ambulation and balance outside of PT. Frequency was also decreased in the past from every other week to once per month to find the minimum effective frequency, however, unfortunately this resulted in an unacceptable  decline in patient's function as well. Pt is regularly exercising at home. She is unable to complete all necessary exercises safely at home either independently or with family assistance due to need from physical therapist to monitor and correct form and technique as well as to provide safe guarding. Therapist provided continued reinforcement regarding the importance of consistency with HEP as always. Pt discharged today, despite potential to further benefit from physical therapy intervention to maintain her functional status including LE strength, balance, and gait speed/quality. If pt experiences decline outside of therapy pt was encouraged to follow-up with her PCP.     Personal Factors and Comorbidities Comorbidity 1;Past/Current Experience;Time since onset of injury/illness/exacerbation;Transportation;Social Background    Comorbidities spina bifida    Examination-Activity Limitations Bend;Carry;Continence;Lift;Reach Overhead;Squat;Stairs;Stand;Transfers    Examination-Participation Restrictions Church;Community Activity;Driving;Laundry;Meal Prep;Volunteer;Yard Work    Stability/Clinical Decision Making Stable/Uncomplicated    Rehab Potential Good    Clinical Impairments Affecting Rehab Potential weakness and decreased standing balance    PT Frequency 1 visit every other week   PT Duration 12 weeks    PT Treatment/Interventions ADLs/Self Care Home Management; ;DME Instruction;Gait training;Stair training;Functional mobility training;Neuromuscular re-education;Balance training;Therapeutic exercise;Therapeutic activities;Patient/family education;Manual techniques;Energy conservation;   PT Next Visit Plan review and modify HEP as appropriate, balance and strength exercises    PT Home Exercise Plan Seated, supine, and core program: [email or web address]    Consulted and Agree with Plan of Care Patient            Sherri Sear, SPT [facility] [address] D Huprich PT, DPT, GCS Huprich,Jason,  PT 01/12/2024, 10:36 AM

## 2024-01-12 ENCOUNTER — Ambulatory Visit: Payer: Medicaid Other

## 2024-01-24 ENCOUNTER — Ambulatory Visit: Payer: Medicaid Other

## 2024-01-26 ENCOUNTER — Ambulatory Visit: Payer: Medicaid Other

## 2024-02-06 ENCOUNTER — Ambulatory Visit: Payer: Medicaid Other

## 2024-02-07 ENCOUNTER — Ambulatory Visit: Payer: Medicaid Other

## 2024-02-09 ENCOUNTER — Ambulatory Visit: Payer: Medicaid Other

## 2024-02-21 ENCOUNTER — Ambulatory Visit: Payer: Medicaid Other

## 2024-02-23 ENCOUNTER — Ambulatory Visit: Payer: Medicaid Other

## 2024-03-06 ENCOUNTER — Ambulatory Visit: Payer: Medicaid Other

## 2024-03-08 ENCOUNTER — Ambulatory Visit: Payer: Medicaid Other

## 2024-03-22 ENCOUNTER — Ambulatory Visit: Payer: Medicaid Other

## 2024-04-05 ENCOUNTER — Ambulatory Visit: Payer: Medicaid Other

## 2024-04-19 ENCOUNTER — Ambulatory Visit: Payer: Medicaid Other

## 2024-04-26 ENCOUNTER — Ambulatory Visit: Attending: Student

## 2024-04-26 DIAGNOSIS — M6281 Muscle weakness (generalized): Secondary | ICD-10-CM | POA: Diagnosis present

## 2024-04-26 DIAGNOSIS — R262 Difficulty in walking, not elsewhere classified: Secondary | ICD-10-CM | POA: Diagnosis present
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
## 2024-05-03 ENCOUNTER — Ambulatory Visit

## 2024-05-03 ENCOUNTER — Ambulatory Visit: Payer: Medicaid Other

## 2024-05-08 ENCOUNTER — Encounter

## 2024-05-10 ENCOUNTER — Ambulatory Visit

## 2024-05-17 ENCOUNTER — Ambulatory Visit: Payer: Medicaid Other

## 2024-05-17 ENCOUNTER — Encounter

## 2024-05-21 ENCOUNTER — Encounter

## 2024-05-23 ENCOUNTER — Encounter

## 2024-05-24 ENCOUNTER — Ambulatory Visit: Attending: Student

## 2024-05-24 ENCOUNTER — Ambulatory Visit

## 2024-05-24 DIAGNOSIS — M6281 Muscle weakness (generalized): Secondary | ICD-10-CM | POA: Diagnosis present

## 2024-05-24 DIAGNOSIS — R2681 Unsteadiness on feet: Secondary | ICD-10-CM | POA: Diagnosis present

## 2024-05-24 DIAGNOSIS — R262 Difficulty in walking, not elsewhere classified: Secondary | ICD-10-CM | POA: Diagnosis present
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
## 2024-05-28 ENCOUNTER — Encounter

## 2024-05-30 ENCOUNTER — Encounter

## 2024-05-31 ENCOUNTER — Ambulatory Visit

## 2024-05-31 ENCOUNTER — Ambulatory Visit: Payer: Medicaid Other

## 2024-05-31 DIAGNOSIS — R262 Difficulty in walking, not elsewhere classified: Secondary | ICD-10-CM

## 2024-05-31 DIAGNOSIS — M6281 Muscle weakness (generalized): Secondary | ICD-10-CM

## 2024-05-31 DIAGNOSIS — R2681 Unsteadiness on feet: Secondary | ICD-10-CM
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
## 2024-06-04 ENCOUNTER — Encounter

## 2024-06-06 ENCOUNTER — Encounter

## 2024-06-07 ENCOUNTER — Ambulatory Visit

## 2024-06-07 DIAGNOSIS — R262 Difficulty in walking, not elsewhere classified: Secondary | ICD-10-CM | POA: Diagnosis not present

## 2024-06-07 DIAGNOSIS — M6281 Muscle weakness (generalized): Secondary | ICD-10-CM
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
## 2024-06-13 NOTE — Addendum Note (Signed)
 Addended by: ROYAL MAYO D on: 06/13/2024 01:22 PM   Modules accepted: Orders

## 2024-06-14 ENCOUNTER — Ambulatory Visit: Payer: Medicaid Other

## 2024-06-21 ENCOUNTER — Ambulatory Visit: Attending: Student

## 2024-06-21 DIAGNOSIS — M6281 Muscle weakness (generalized): Secondary | ICD-10-CM | POA: Diagnosis present

## 2024-06-21 DIAGNOSIS — R262 Difficulty in walking, not elsewhere classified: Secondary | ICD-10-CM | POA: Diagnosis present
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
## 2024-06-28 ENCOUNTER — Ambulatory Visit: Payer: Medicaid Other

## 2024-06-28 ENCOUNTER — Ambulatory Visit

## 2024-06-28 DIAGNOSIS — M6281 Muscle weakness (generalized): Secondary | ICD-10-CM

## 2024-06-28 DIAGNOSIS — R262 Difficulty in walking, not elsewhere classified: Secondary | ICD-10-CM
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
## 2024-07-05 ENCOUNTER — Ambulatory Visit

## 2024-07-05 DIAGNOSIS — R262 Difficulty in walking, not elsewhere classified: Secondary | ICD-10-CM | POA: Diagnosis not present

## 2024-07-05 DIAGNOSIS — M6281 Muscle weakness (generalized): Secondary | ICD-10-CM

## 2024-07-12 ENCOUNTER — Ambulatory Visit: Payer: Medicaid Other

## 2024-07-12 ENCOUNTER — Ambulatory Visit

## 2024-07-12 DIAGNOSIS — R262 Difficulty in walking, not elsewhere classified: Secondary | ICD-10-CM

## 2024-07-12 DIAGNOSIS — M6281 Muscle weakness (generalized): Secondary | ICD-10-CM
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
## 2024-07-19 ENCOUNTER — Ambulatory Visit: Attending: Student

## 2024-07-19 DIAGNOSIS — R262 Difficulty in walking, not elsewhere classified: Secondary | ICD-10-CM | POA: Insufficient documentation

## 2024-07-19 DIAGNOSIS — M6281 Muscle weakness (generalized): Secondary | ICD-10-CM | POA: Diagnosis present

## 2024-07-26 ENCOUNTER — Ambulatory Visit: Payer: Medicaid Other

## 2024-07-26 ENCOUNTER — Ambulatory Visit

## 2024-07-26 DIAGNOSIS — R262 Difficulty in walking, not elsewhere classified: Secondary | ICD-10-CM | POA: Diagnosis not present

## 2024-07-26 DIAGNOSIS — M6281 Muscle weakness (generalized): Secondary | ICD-10-CM
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
## 2024-08-02 ENCOUNTER — Ambulatory Visit

## 2024-08-02 DIAGNOSIS — R262 Difficulty in walking, not elsewhere classified: Secondary | ICD-10-CM | POA: Diagnosis not present

## 2024-08-02 DIAGNOSIS — M6281 Muscle weakness (generalized): Secondary | ICD-10-CM
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
## 2024-08-07 ENCOUNTER — Ambulatory Visit

## 2024-08-07 DIAGNOSIS — M6281 Muscle weakness (generalized): Secondary | ICD-10-CM

## 2024-08-07 DIAGNOSIS — R262 Difficulty in walking, not elsewhere classified: Secondary | ICD-10-CM

## 2024-08-09 ENCOUNTER — Encounter

## 2024-08-09 ENCOUNTER — Ambulatory Visit: Payer: Medicaid Other

## 2024-08-16 ENCOUNTER — Ambulatory Visit: Attending: Student

## 2024-08-16 DIAGNOSIS — R262 Difficulty in walking, not elsewhere classified: Secondary | ICD-10-CM | POA: Insufficient documentation

## 2024-08-16 DIAGNOSIS — M6281 Muscle weakness (generalized): Secondary | ICD-10-CM | POA: Diagnosis present
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
## 2024-08-23 ENCOUNTER — Ambulatory Visit

## 2024-08-23 ENCOUNTER — Ambulatory Visit: Payer: Medicaid Other

## 2024-08-23 DIAGNOSIS — R262 Difficulty in walking, not elsewhere classified: Secondary | ICD-10-CM | POA: Diagnosis not present

## 2024-08-23 DIAGNOSIS — M6281 Muscle weakness (generalized): Secondary | ICD-10-CM
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
## 2024-08-30 ENCOUNTER — Ambulatory Visit

## 2024-08-30 DIAGNOSIS — M6281 Muscle weakness (generalized): Secondary | ICD-10-CM

## 2024-08-30 DIAGNOSIS — R262 Difficulty in walking, not elsewhere classified: Secondary | ICD-10-CM
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
## 2024-09-06 ENCOUNTER — Ambulatory Visit

## 2024-09-06 ENCOUNTER — Ambulatory Visit: Payer: Medicaid Other

## 2024-09-06 DIAGNOSIS — R262 Difficulty in walking, not elsewhere classified: Secondary | ICD-10-CM | POA: Diagnosis not present

## 2024-09-06 DIAGNOSIS — R279 Unspecified lack of coordination: Secondary | ICD-10-CM

## 2024-09-06 DIAGNOSIS — R2681 Unsteadiness on feet: Secondary | ICD-10-CM

## 2024-09-06 DIAGNOSIS — M6281 Muscle weakness (generalized): Secondary | ICD-10-CM

## 2024-09-06 DIAGNOSIS — R531 Weakness: Secondary | ICD-10-CM
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
## 2024-09-20 ENCOUNTER — Ambulatory Visit: Payer: Medicaid Other

## 2024-10-04 ENCOUNTER — Ambulatory Visit: Payer: Medicaid Other

## 2024-10-18 ENCOUNTER — Ambulatory Visit: Payer: Medicaid Other

## 2024-10-25 ENCOUNTER — Ambulatory Visit

## 2024-11-01 ENCOUNTER — Ambulatory Visit

## 2024-11-01 ENCOUNTER — Ambulatory Visit: Payer: Medicaid Other

## 2024-11-06 ENCOUNTER — Ambulatory Visit

## 2024-11-13 ENCOUNTER — Ambulatory Visit
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
## 2024-11-21 ENCOUNTER — Ambulatory Visit: Attending: Student

## 2024-11-21 DIAGNOSIS — M6281 Muscle weakness (generalized): Secondary | ICD-10-CM | POA: Diagnosis present

## 2024-11-21 DIAGNOSIS — R262 Difficulty in walking, not elsewhere classified: Secondary | ICD-10-CM | POA: Insufficient documentation

## 2024-11-26 ENCOUNTER — Ambulatory Visit

## 2024-11-28 ENCOUNTER — Ambulatory Visit

## 2024-11-28 DIAGNOSIS — R262 Difficulty in walking, not elsewhere classified: Secondary | ICD-10-CM | POA: Diagnosis not present

## 2024-11-28 DIAGNOSIS — M6281 Muscle weakness (generalized): Secondary | ICD-10-CM
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
## 2024-12-03 ENCOUNTER — Ambulatory Visit

## 2024-12-03 DIAGNOSIS — M6281 Muscle weakness (generalized): Secondary | ICD-10-CM

## 2024-12-03 DIAGNOSIS — R262 Difficulty in walking, not elsewhere classified: Secondary | ICD-10-CM

## 2024-12-05 ENCOUNTER — Ambulatory Visit

## 2024-12-05 DIAGNOSIS — R262 Difficulty in walking, not elsewhere classified: Secondary | ICD-10-CM

## 2024-12-05 DIAGNOSIS — M6281 Muscle weakness (generalized): Secondary | ICD-10-CM
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
## 2024-12-10 ENCOUNTER — Ambulatory Visit

## 2024-12-12 ENCOUNTER — Ambulatory Visit
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
## 2024-12-17 ENCOUNTER — Ambulatory Visit

## 2024-12-17 DIAGNOSIS — M6281 Muscle weakness (generalized): Secondary | ICD-10-CM

## 2024-12-17 DIAGNOSIS — R262 Difficulty in walking, not elsewhere classified: Secondary | ICD-10-CM

## 2024-12-18 ENCOUNTER — Ambulatory Visit: Payer: Self-pay | Attending: Student

## 2024-12-18 DIAGNOSIS — R262 Difficulty in walking, not elsewhere classified: Secondary | ICD-10-CM

## 2024-12-18 DIAGNOSIS — M6281 Muscle weakness (generalized): Secondary | ICD-10-CM

## 2024-12-19 ENCOUNTER — Ambulatory Visit

## 2024-12-24 ENCOUNTER — Ambulatory Visit

## 2024-12-26 ENCOUNTER — Ambulatory Visit

## 2024-12-31 ENCOUNTER — Ambulatory Visit

## 2025-01-02 ENCOUNTER — Ambulatory Visit

## 2025-01-07 ENCOUNTER — Ambulatory Visit

## 2025-01-09 ENCOUNTER — Ambulatory Visit

## 2025-01-14 ENCOUNTER — Ambulatory Visit: Attending: Student

## 2025-01-16 ENCOUNTER — Ambulatory Visit

## 2025-01-21 ENCOUNTER — Ambulatory Visit

## 2025-01-23 ENCOUNTER — Ambulatory Visit

## 2025-01-28 ENCOUNTER — Ambulatory Visit

## 2025-02-04 ENCOUNTER — Ambulatory Visit

## 2025-02-11 ENCOUNTER — Ambulatory Visit

## 2025-02-18 ENCOUNTER — Ambulatory Visit: Attending: Student

## 2025-02-25 ENCOUNTER — Ambulatory Visit

## 2025-03-04 ENCOUNTER — Ambulatory Visit

## 2025-03-11 ENCOUNTER — Ambulatory Visit

## 2025-03-18 ENCOUNTER — Ambulatory Visit: Attending: Student

## 2025-03-25 ENCOUNTER — Ambulatory Visit

## 2025-04-01 ENCOUNTER — Ambulatory Visit
